# Patient Record
Sex: Female | Born: 1940
Health system: Southern US, Community
[De-identification: ages and names within clinical notes are randomized; demographics above are authoritative.]

## PROBLEM LIST (undated history)

## (undated) DIAGNOSIS — Z853 Personal history of malignant neoplasm of breast: Secondary | ICD-10-CM

## (undated) DIAGNOSIS — I10 Essential (primary) hypertension: Secondary | ICD-10-CM

## (undated) DIAGNOSIS — D649 Anemia, unspecified: Secondary | ICD-10-CM

## (undated) DIAGNOSIS — E119 Type 2 diabetes mellitus without complications: Secondary | ICD-10-CM

## (undated) DIAGNOSIS — K5730 Diverticulosis of large intestine without perforation or abscess without bleeding: Secondary | ICD-10-CM

## (undated) DIAGNOSIS — K219 Gastro-esophageal reflux disease without esophagitis: Secondary | ICD-10-CM

## (undated) DIAGNOSIS — G629 Polyneuropathy, unspecified: Secondary | ICD-10-CM

## (undated) DIAGNOSIS — R269 Unspecified abnormalities of gait and mobility: Secondary | ICD-10-CM

## (undated) DIAGNOSIS — E785 Hyperlipidemia, unspecified: Secondary | ICD-10-CM

## (undated) DIAGNOSIS — G40909 Epilepsy, unspecified, not intractable, without status epilepticus: Secondary | ICD-10-CM

## (undated) DIAGNOSIS — M4712 Other spondylosis with myelopathy, cervical region: Secondary | ICD-10-CM

## (undated) DIAGNOSIS — N289 Disorder of kidney and ureter, unspecified: Secondary | ICD-10-CM

## (undated) DIAGNOSIS — F419 Anxiety disorder, unspecified: Secondary | ICD-10-CM

## (undated) DIAGNOSIS — M353 Polymyalgia rheumatica: Secondary | ICD-10-CM

## (undated) DIAGNOSIS — M069 Rheumatoid arthritis, unspecified: Secondary | ICD-10-CM

## (undated) DIAGNOSIS — M1712 Unilateral primary osteoarthritis, left knee: Secondary | ICD-10-CM

## (undated) DIAGNOSIS — M316 Other giant cell arteritis: Secondary | ICD-10-CM

## (undated) DIAGNOSIS — G5600 Carpal tunnel syndrome, unspecified upper limb: Secondary | ICD-10-CM

## (undated) DIAGNOSIS — Z9889 Other specified postprocedural states: Secondary | ICD-10-CM

## (undated) DIAGNOSIS — G63 Polyneuropathy in diseases classified elsewhere: Secondary | ICD-10-CM

## (undated) DIAGNOSIS — I639 Cerebral infarction, unspecified: Secondary | ICD-10-CM

## (undated) HISTORY — DX: Diverticulosis of large intestine without perforation or abscess without bleeding: K57.30

## (undated) HISTORY — PX: CATARACT EXTRACTION: SUR2

## (undated) HISTORY — DX: Anemia, unspecified: D64.9

## (undated) HISTORY — DX: Rheumatoid arthritis, unspecified: M06.9

## (undated) HISTORY — DX: Type 2 diabetes mellitus without complications: E11.9

## (undated) HISTORY — DX: Epilepsy, unspecified, not intractable, without status epilepticus: G40.909

## (undated) HISTORY — PX: MASTECTOMY: SHX3

## (undated) HISTORY — DX: Anxiety disorder, unspecified: F41.9

## (undated) HISTORY — DX: Unilateral primary osteoarthritis, left knee: M17.12

## (undated) HISTORY — DX: Polyneuropathy, unspecified: G62.9

## (undated) HISTORY — DX: Hyperlipidemia, unspecified: E78.5

## (undated) HISTORY — DX: Cerebral infarction, unspecified: I63.9

## (undated) HISTORY — PX: NECK SURGERY: SHX720

## (undated) HISTORY — DX: Gastro-esophageal reflux disease without esophagitis: K21.9

## (undated) HISTORY — DX: Polymyalgia rheumatica: M35.3

## (undated) HISTORY — PX: CHOLECYSTECTOMY: SHX55

## (undated) HISTORY — DX: Polyneuropathy in diseases classified elsewhere: G63

## (undated) HISTORY — DX: Other spondylosis with myelopathy, cervical region: M47.12

## (undated) HISTORY — DX: Disorder of kidney and ureter, unspecified: N28.9

## (undated) HISTORY — DX: Essential (primary) hypertension: I10

## (undated) HISTORY — PX: TOTAL KNEE ARTHROPLASTY: SHX125

## (undated) HISTORY — PX: OTHER SURGICAL HISTORY: SHX169

## (undated) HISTORY — PX: TONSILLECTOMY: SUR1361

## (undated) HISTORY — DX: Unspecified abnormalities of gait and mobility: R26.9

## (undated) HISTORY — PX: CARPAL TUNNEL RELEASE: SHX101

## (undated) HISTORY — DX: Other giant cell arteritis: M31.6

## (undated) HISTORY — DX: Carpal tunnel syndrome, unspecified upper limb: G56.00

## (undated) HISTORY — DX: Other specified postprocedural states: Z98.890

## (undated) HISTORY — DX: Personal history of malignant neoplasm of breast: Z85.3

---

## 2000-09-16 ENCOUNTER — Ambulatory Visit (HOSPITAL_COMMUNITY): Admission: RE | Admit: 2000-09-16 | Discharge: 2000-09-16 | Payer: Self-pay | Admitting: Neurosurgery

## 2000-09-16 ENCOUNTER — Encounter: Payer: Self-pay | Admitting: Neurosurgery

## 2000-10-01 ENCOUNTER — Ambulatory Visit (HOSPITAL_COMMUNITY): Admission: RE | Admit: 2000-10-01 | Discharge: 2000-10-01 | Payer: Self-pay | Admitting: Neurosurgery

## 2000-10-01 ENCOUNTER — Encounter: Payer: Self-pay | Admitting: Neurosurgery

## 2000-10-15 ENCOUNTER — Encounter: Payer: Self-pay | Admitting: Neurosurgery

## 2000-10-15 ENCOUNTER — Ambulatory Visit (HOSPITAL_COMMUNITY): Admission: RE | Admit: 2000-10-15 | Discharge: 2000-10-15 | Payer: Self-pay | Admitting: Neurosurgery

## 2002-07-23 ENCOUNTER — Encounter: Payer: Self-pay | Admitting: Critical Care Medicine

## 2002-07-23 ENCOUNTER — Ambulatory Visit (HOSPITAL_COMMUNITY): Admission: RE | Admit: 2002-07-23 | Discharge: 2002-07-23 | Payer: Self-pay | Admitting: Critical Care Medicine

## 2003-07-26 ENCOUNTER — Ambulatory Visit (HOSPITAL_COMMUNITY): Admission: RE | Admit: 2003-07-26 | Discharge: 2003-07-26 | Payer: Self-pay | Admitting: Critical Care Medicine

## 2003-08-02 ENCOUNTER — Ambulatory Visit (HOSPITAL_COMMUNITY): Admission: RE | Admit: 2003-08-02 | Discharge: 2003-08-02 | Payer: Self-pay | Admitting: Critical Care Medicine

## 2003-08-02 ENCOUNTER — Encounter (INDEPENDENT_AMBULATORY_CARE_PROVIDER_SITE_OTHER): Payer: Self-pay | Admitting: Specialist

## 2004-09-22 ENCOUNTER — Ambulatory Visit: Payer: Self-pay | Admitting: Internal Medicine

## 2004-09-29 ENCOUNTER — Ambulatory Visit: Payer: Self-pay | Admitting: Internal Medicine

## 2004-10-03 ENCOUNTER — Encounter: Admission: RE | Admit: 2004-10-03 | Discharge: 2004-10-03 | Payer: Self-pay | Admitting: Internal Medicine

## 2004-10-16 ENCOUNTER — Ambulatory Visit: Payer: Self-pay | Admitting: Critical Care Medicine

## 2004-10-19 ENCOUNTER — Ambulatory Visit: Payer: Self-pay | Admitting: Gastroenterology

## 2004-10-19 ENCOUNTER — Ambulatory Visit: Payer: Self-pay | Admitting: Critical Care Medicine

## 2004-11-10 ENCOUNTER — Ambulatory Visit: Payer: Self-pay | Admitting: Gastroenterology

## 2004-11-14 ENCOUNTER — Ambulatory Visit: Payer: Self-pay | Admitting: Internal Medicine

## 2004-11-15 ENCOUNTER — Encounter (INDEPENDENT_AMBULATORY_CARE_PROVIDER_SITE_OTHER): Payer: Self-pay | Admitting: Specialist

## 2004-11-15 ENCOUNTER — Inpatient Hospital Stay (HOSPITAL_COMMUNITY): Admission: EM | Admit: 2004-11-15 | Discharge: 2004-11-17 | Payer: Self-pay | Admitting: Emergency Medicine

## 2004-12-02 ENCOUNTER — Emergency Department (HOSPITAL_COMMUNITY): Admission: EM | Admit: 2004-12-02 | Discharge: 2004-12-02 | Payer: Self-pay | Admitting: Emergency Medicine

## 2005-01-11 ENCOUNTER — Ambulatory Visit: Payer: Self-pay | Admitting: Internal Medicine

## 2005-01-11 ENCOUNTER — Ambulatory Visit: Payer: Self-pay | Admitting: Critical Care Medicine

## 2005-05-07 ENCOUNTER — Ambulatory Visit: Payer: Self-pay | Admitting: Critical Care Medicine

## 2005-08-08 ENCOUNTER — Ambulatory Visit: Payer: Self-pay | Admitting: Critical Care Medicine

## 2005-08-28 ENCOUNTER — Ambulatory Visit: Payer: Self-pay | Admitting: Pulmonary Disease

## 2005-09-11 ENCOUNTER — Ambulatory Visit: Payer: Self-pay | Admitting: Critical Care Medicine

## 2005-10-01 ENCOUNTER — Ambulatory Visit: Payer: Self-pay | Admitting: Critical Care Medicine

## 2005-10-04 ENCOUNTER — Ambulatory Visit: Payer: Self-pay | Admitting: Cardiology

## 2005-11-05 ENCOUNTER — Ambulatory Visit: Payer: Self-pay | Admitting: Internal Medicine

## 2005-12-05 ENCOUNTER — Ambulatory Visit: Payer: Self-pay | Admitting: Critical Care Medicine

## 2006-01-22 ENCOUNTER — Ambulatory Visit: Payer: Self-pay | Admitting: Internal Medicine

## 2006-03-11 ENCOUNTER — Ambulatory Visit: Payer: Self-pay | Admitting: Critical Care Medicine

## 2006-05-20 ENCOUNTER — Ambulatory Visit: Payer: Self-pay | Admitting: Internal Medicine

## 2006-06-28 ENCOUNTER — Ambulatory Visit: Payer: Self-pay | Admitting: Critical Care Medicine

## 2006-07-29 ENCOUNTER — Ambulatory Visit: Payer: Self-pay | Admitting: Critical Care Medicine

## 2006-07-30 ENCOUNTER — Ambulatory Visit: Payer: Self-pay | Admitting: *Deleted

## 2006-08-13 ENCOUNTER — Ambulatory Visit: Payer: Self-pay | Admitting: Critical Care Medicine

## 2006-09-20 ENCOUNTER — Ambulatory Visit: Payer: Self-pay | Admitting: Critical Care Medicine

## 2006-10-01 ENCOUNTER — Ambulatory Visit: Payer: Self-pay | Admitting: Internal Medicine

## 2006-10-01 LAB — CONVERTED CEMR LAB
Basophils Absolute: 0.1 10*3/uL (ref 0.0–0.1)
CO2: 31 meq/L (ref 19–32)
Creatinine, Ser: 0.7 mg/dL (ref 0.4–1.2)
Eosinophil percent: 6.5 % — ABNORMAL HIGH (ref 0.0–5.0)
HCT: 43.5 % (ref 36.0–46.0)
Hemoglobin: 14.4 g/dL (ref 12.0–15.0)
MCV: 93.9 fL (ref 78.0–100.0)
Neutrophils Relative %: 59 % (ref 43.0–77.0)
Potassium: 4.6 meq/L (ref 3.5–5.1)
RBC: 4.63 M/uL (ref 3.87–5.11)
Sodium: 141 meq/L (ref 135–145)
TSH: 1.94 microintl units/mL (ref 0.35–5.50)
Total Bilirubin: 0.8 mg/dL (ref 0.3–1.2)
Total Protein: 6.8 g/dL (ref 6.0–8.3)
WBC: 9.6 10*3/uL (ref 4.5–10.5)

## 2006-11-20 ENCOUNTER — Ambulatory Visit: Payer: Self-pay | Admitting: Critical Care Medicine

## 2006-11-21 ENCOUNTER — Ambulatory Visit: Payer: Self-pay | Admitting: Internal Medicine

## 2006-11-21 ENCOUNTER — Ambulatory Visit: Payer: Self-pay | Admitting: Family Medicine

## 2006-12-06 ENCOUNTER — Ambulatory Visit: Payer: Self-pay | Admitting: Internal Medicine

## 2006-12-11 ENCOUNTER — Ambulatory Visit: Payer: Self-pay | Admitting: Cardiovascular Disease

## 2006-12-20 ENCOUNTER — Ambulatory Visit: Payer: Self-pay

## 2007-01-06 ENCOUNTER — Ambulatory Visit: Payer: Self-pay | Admitting: Critical Care Medicine

## 2007-03-10 ENCOUNTER — Ambulatory Visit: Payer: Self-pay | Admitting: Internal Medicine

## 2007-03-14 ENCOUNTER — Ambulatory Visit: Payer: Self-pay | Admitting: Critical Care Medicine

## 2007-04-04 ENCOUNTER — Inpatient Hospital Stay (HOSPITAL_COMMUNITY): Admission: RE | Admit: 2007-04-04 | Discharge: 2007-04-09 | Payer: Self-pay | Admitting: Specialist

## 2007-04-04 ENCOUNTER — Ambulatory Visit: Payer: Self-pay | Admitting: Physical Medicine & Rehabilitation

## 2007-04-04 ENCOUNTER — Ambulatory Visit: Payer: Self-pay | Admitting: Internal Medicine

## 2007-04-21 ENCOUNTER — Ambulatory Visit: Payer: Self-pay | Admitting: Pulmonary Disease

## 2007-05-12 ENCOUNTER — Ambulatory Visit: Payer: Self-pay | Admitting: Critical Care Medicine

## 2007-05-17 ENCOUNTER — Encounter: Payer: Self-pay | Admitting: Critical Care Medicine

## 2007-05-17 DIAGNOSIS — M810 Age-related osteoporosis without current pathological fracture: Secondary | ICD-10-CM | POA: Insufficient documentation

## 2007-05-17 DIAGNOSIS — Z853 Personal history of malignant neoplasm of breast: Secondary | ICD-10-CM | POA: Insufficient documentation

## 2007-05-17 DIAGNOSIS — J8410 Pulmonary fibrosis, unspecified: Secondary | ICD-10-CM | POA: Insufficient documentation

## 2007-05-17 DIAGNOSIS — Z978 Presence of other specified devices: Secondary | ICD-10-CM | POA: Insufficient documentation

## 2007-05-17 DIAGNOSIS — Z9010 Acquired absence of unspecified breast and nipple: Secondary | ICD-10-CM | POA: Insufficient documentation

## 2007-06-26 DIAGNOSIS — J479 Bronchiectasis, uncomplicated: Secondary | ICD-10-CM | POA: Insufficient documentation

## 2007-07-14 ENCOUNTER — Ambulatory Visit: Payer: Self-pay | Admitting: Critical Care Medicine

## 2007-09-09 ENCOUNTER — Ambulatory Visit: Payer: Self-pay | Admitting: Critical Care Medicine

## 2007-09-09 DIAGNOSIS — J209 Acute bronchitis, unspecified: Secondary | ICD-10-CM | POA: Insufficient documentation

## 2007-09-29 ENCOUNTER — Telehealth: Payer: Self-pay | Admitting: Critical Care Medicine

## 2007-09-29 ENCOUNTER — Ambulatory Visit: Payer: Self-pay | Admitting: Critical Care Medicine

## 2007-10-08 ENCOUNTER — Ambulatory Visit: Payer: Self-pay | Admitting: Internal Medicine

## 2007-10-08 DIAGNOSIS — E785 Hyperlipidemia, unspecified: Secondary | ICD-10-CM | POA: Insufficient documentation

## 2007-10-08 DIAGNOSIS — R5383 Other fatigue: Secondary | ICD-10-CM

## 2007-10-08 DIAGNOSIS — R509 Fever, unspecified: Secondary | ICD-10-CM | POA: Insufficient documentation

## 2007-10-08 DIAGNOSIS — F411 Generalized anxiety disorder: Secondary | ICD-10-CM | POA: Insufficient documentation

## 2007-10-08 DIAGNOSIS — K5730 Diverticulosis of large intestine without perforation or abscess without bleeding: Secondary | ICD-10-CM | POA: Insufficient documentation

## 2007-10-08 DIAGNOSIS — J309 Allergic rhinitis, unspecified: Secondary | ICD-10-CM | POA: Insufficient documentation

## 2007-10-08 DIAGNOSIS — K219 Gastro-esophageal reflux disease without esophagitis: Secondary | ICD-10-CM | POA: Insufficient documentation

## 2007-10-08 DIAGNOSIS — R5381 Other malaise: Secondary | ICD-10-CM | POA: Insufficient documentation

## 2007-10-08 LAB — CONVERTED CEMR LAB
ALT: 20 units/L (ref 0–35)
Albumin: 2.4 g/dL — ABNORMAL LOW (ref 3.5–5.2)
Alkaline Phosphatase: 66 units/L (ref 39–117)
BUN: 14 mg/dL (ref 6–23)
Basophils Relative: 0.7 % (ref 0.0–1.0)
Bilirubin Urine: NEGATIVE
CO2: 30 meq/L (ref 19–32)
Calcium: 8.6 mg/dL (ref 8.4–10.5)
Creatinine, Ser: 0.7 mg/dL (ref 0.4–1.2)
GFR calc Af Amer: 107 mL/min
Monocytes Relative: 5.8 % (ref 3.0–11.0)
Neutro Abs: 14.6 10*3/uL — ABNORMAL HIGH (ref 1.4–7.7)
Platelets: 502 10*3/uL — ABNORMAL HIGH (ref 150–400)
RDW: 12.2 % (ref 11.5–14.6)
Specific Gravity, Urine: 1.025 (ref 1.000–1.03)
Total Protein, Urine: 30 mg/dL — AB
Total Protein: 6.7 g/dL (ref 6.0–8.3)
Urine Glucose: NEGATIVE mg/dL
pH: 6 (ref 5.0–8.0)

## 2007-10-09 ENCOUNTER — Ambulatory Visit: Payer: Self-pay | Admitting: Internal Medicine

## 2007-10-09 LAB — CONVERTED CEMR LAB
AST: 18 units/L (ref 0–37)
Bilirubin, Direct: 0.1 mg/dL (ref 0.0–0.3)
CO2: 30 meq/L (ref 19–32)
Crystals: NEGATIVE
Eosinophils Absolute: 0.6 10*3/uL (ref 0.0–0.6)
Eosinophils Relative: 3.2 % (ref 0.0–5.0)
GFR calc Af Amer: 107 mL/min
GFR calc non Af Amer: 89 mL/min
Glucose, Bld: 120 mg/dL — ABNORMAL HIGH (ref 70–99)
HCT: 37 % (ref 36.0–46.0)
Hemoglobin: 12.9 g/dL (ref 12.0–15.0)
Leukocytes, UA: NEGATIVE
Lymphocytes Relative: 10.1 % — ABNORMAL LOW (ref 12.0–46.0)
MCV: 89.9 fL (ref 78.0–100.0)
Monocytes Absolute: 1.1 10*3/uL — ABNORMAL HIGH (ref 0.2–0.7)
Mucus, UA: NEGATIVE
Neutro Abs: 14.6 10*3/uL — ABNORMAL HIGH (ref 1.4–7.7)
Neutrophils Relative %: 80.2 % — ABNORMAL HIGH (ref 43.0–77.0)
Nitrite: NEGATIVE
Platelets: 502 10*3/uL — ABNORMAL HIGH (ref 150–400)
Sodium: 136 meq/L (ref 135–145)
TSH: 1.8 microintl units/mL (ref 0.35–5.50)
Total Protein: 6.7 g/dL (ref 6.0–8.3)
Urine Glucose: NEGATIVE mg/dL
WBC: 18.2 10*3/uL (ref 4.5–10.5)

## 2007-10-18 ENCOUNTER — Ambulatory Visit: Payer: Self-pay | Admitting: Family Medicine

## 2007-10-18 DIAGNOSIS — D7289 Other specified disorders of white blood cells: Secondary | ICD-10-CM | POA: Insufficient documentation

## 2007-10-18 DIAGNOSIS — J069 Acute upper respiratory infection, unspecified: Secondary | ICD-10-CM | POA: Insufficient documentation

## 2007-10-20 ENCOUNTER — Ambulatory Visit: Payer: Self-pay | Admitting: Family Medicine

## 2007-10-21 ENCOUNTER — Encounter: Payer: Self-pay | Admitting: Internal Medicine

## 2007-10-21 DIAGNOSIS — D72829 Elevated white blood cell count, unspecified: Secondary | ICD-10-CM | POA: Insufficient documentation

## 2007-10-22 LAB — CONVERTED CEMR LAB
Basophils Absolute: 0 10*3/uL (ref 0.0–0.1)
Basophils Relative: 0 % (ref 0.0–1.0)
HCT: 35.7 % — ABNORMAL LOW (ref 36.0–46.0)
Hemoglobin: 12.1 g/dL (ref 12.0–15.0)
MCHC: 33.8 g/dL (ref 30.0–36.0)
Monocytes Absolute: 0.9 10*3/uL — ABNORMAL HIGH (ref 0.2–0.7)
Monocytes Relative: 5.3 % (ref 3.0–11.0)
RBC: 4.08 M/uL (ref 3.87–5.11)
RDW: 12.5 % (ref 11.5–14.6)

## 2007-10-23 ENCOUNTER — Ambulatory Visit: Payer: Self-pay | Admitting: Oncology

## 2007-10-23 ENCOUNTER — Ambulatory Visit: Payer: Self-pay | Admitting: Internal Medicine

## 2007-10-23 DIAGNOSIS — J019 Acute sinusitis, unspecified: Secondary | ICD-10-CM | POA: Insufficient documentation

## 2007-10-26 DIAGNOSIS — I639 Cerebral infarction, unspecified: Secondary | ICD-10-CM

## 2007-10-26 HISTORY — DX: Cerebral infarction, unspecified: I63.9

## 2007-10-31 ENCOUNTER — Encounter: Payer: Self-pay | Admitting: Internal Medicine

## 2007-10-31 ENCOUNTER — Telehealth (INDEPENDENT_AMBULATORY_CARE_PROVIDER_SITE_OTHER): Payer: Self-pay | Admitting: *Deleted

## 2007-10-31 DIAGNOSIS — H532 Diplopia: Secondary | ICD-10-CM | POA: Insufficient documentation

## 2007-10-31 LAB — CBC WITH DIFFERENTIAL/PLATELET
Basophils Absolute: 0 10*3/uL (ref 0.0–0.1)
EOS%: 2.1 % (ref 0.0–7.0)
LYMPH%: 5.2 % — ABNORMAL LOW (ref 14.0–48.0)
MCH: 29.2 pg (ref 26.0–34.0)
MCV: 87.2 fL (ref 81.0–101.0)
MONO%: 4.8 % (ref 0.0–13.0)
Platelets: 489 10*3/uL — ABNORMAL HIGH (ref 145–400)
RBC: 3.98 10*6/uL (ref 3.70–5.32)
RDW: 13.5 % (ref 11.3–14.5)

## 2007-10-31 LAB — COMPREHENSIVE METABOLIC PANEL
AST: 22 U/L (ref 0–37)
Alkaline Phosphatase: 91 U/L (ref 39–117)
BUN: 26 mg/dL — ABNORMAL HIGH (ref 6–23)
Calcium: 8.7 mg/dL (ref 8.4–10.5)
Creatinine, Ser: 1 mg/dL (ref 0.40–1.20)
Total Bilirubin: 0.5 mg/dL (ref 0.3–1.2)

## 2007-11-02 ENCOUNTER — Ambulatory Visit: Payer: Self-pay | Admitting: Cardiology

## 2007-11-02 ENCOUNTER — Inpatient Hospital Stay (HOSPITAL_COMMUNITY): Admission: EM | Admit: 2007-11-02 | Discharge: 2007-11-04 | Payer: Self-pay | Admitting: Emergency Medicine

## 2007-11-03 ENCOUNTER — Encounter (INDEPENDENT_AMBULATORY_CARE_PROVIDER_SITE_OTHER): Payer: Self-pay | Admitting: Pediatrics

## 2007-11-04 ENCOUNTER — Encounter: Payer: Self-pay | Admitting: Internal Medicine

## 2007-11-04 ENCOUNTER — Ambulatory Visit: Payer: Self-pay | Admitting: Internal Medicine

## 2007-11-14 LAB — CBC WITH DIFFERENTIAL/PLATELET
Eosinophils Absolute: 0.2 10*3/uL (ref 0.0–0.5)
LYMPH%: 5.6 % — ABNORMAL LOW (ref 14.0–48.0)
MONO#: 1.1 10*3/uL — ABNORMAL HIGH (ref 0.1–0.9)
NEUT#: 20.8 10*3/uL — ABNORMAL HIGH (ref 1.5–6.5)
Platelets: 690 10*3/uL — ABNORMAL HIGH (ref 145–400)
RBC: 4.02 10*6/uL (ref 3.70–5.32)
WBC: 23.4 10*3/uL — ABNORMAL HIGH (ref 3.9–10.0)

## 2007-11-15 ENCOUNTER — Inpatient Hospital Stay (HOSPITAL_COMMUNITY): Admission: EM | Admit: 2007-11-15 | Discharge: 2007-11-19 | Payer: Self-pay | Admitting: Emergency Medicine

## 2007-11-15 ENCOUNTER — Ambulatory Visit: Payer: Self-pay | Admitting: Internal Medicine

## 2007-11-17 ENCOUNTER — Encounter (INDEPENDENT_AMBULATORY_CARE_PROVIDER_SITE_OTHER): Payer: Self-pay | Admitting: General Surgery

## 2007-11-17 ENCOUNTER — Encounter: Payer: Self-pay | Admitting: Internal Medicine

## 2007-11-20 ENCOUNTER — Telehealth: Payer: Self-pay | Admitting: Internal Medicine

## 2007-11-20 ENCOUNTER — Inpatient Hospital Stay (HOSPITAL_COMMUNITY): Admission: EM | Admit: 2007-11-20 | Discharge: 2007-11-26 | Payer: Self-pay | Admitting: Emergency Medicine

## 2007-12-02 ENCOUNTER — Encounter: Payer: Self-pay | Admitting: Internal Medicine

## 2007-12-19 ENCOUNTER — Encounter: Payer: Self-pay | Admitting: Internal Medicine

## 2007-12-30 ENCOUNTER — Ambulatory Visit: Payer: Self-pay | Admitting: Internal Medicine

## 2007-12-30 DIAGNOSIS — D649 Anemia, unspecified: Secondary | ICD-10-CM | POA: Insufficient documentation

## 2007-12-30 DIAGNOSIS — Z8679 Personal history of other diseases of the circulatory system: Secondary | ICD-10-CM | POA: Insufficient documentation

## 2007-12-30 DIAGNOSIS — E119 Type 2 diabetes mellitus without complications: Secondary | ICD-10-CM | POA: Insufficient documentation

## 2007-12-30 DIAGNOSIS — G609 Hereditary and idiopathic neuropathy, unspecified: Secondary | ICD-10-CM | POA: Insufficient documentation

## 2007-12-30 DIAGNOSIS — M79609 Pain in unspecified limb: Secondary | ICD-10-CM | POA: Insufficient documentation

## 2007-12-30 DIAGNOSIS — I10 Essential (primary) hypertension: Secondary | ICD-10-CM | POA: Insufficient documentation

## 2007-12-30 DIAGNOSIS — R569 Unspecified convulsions: Secondary | ICD-10-CM | POA: Insufficient documentation

## 2008-01-09 ENCOUNTER — Emergency Department (HOSPITAL_COMMUNITY): Admission: EM | Admit: 2008-01-09 | Discharge: 2008-01-09 | Payer: Self-pay | Admitting: Emergency Medicine

## 2008-01-13 ENCOUNTER — Telehealth: Payer: Self-pay | Admitting: Endocrinology

## 2008-01-13 ENCOUNTER — Ambulatory Visit: Payer: Self-pay | Admitting: Critical Care Medicine

## 2008-01-13 ENCOUNTER — Encounter: Payer: Self-pay | Admitting: Internal Medicine

## 2008-01-26 ENCOUNTER — Telehealth (INDEPENDENT_AMBULATORY_CARE_PROVIDER_SITE_OTHER): Payer: Self-pay | Admitting: *Deleted

## 2008-01-26 DIAGNOSIS — G5600 Carpal tunnel syndrome, unspecified upper limb: Secondary | ICD-10-CM | POA: Insufficient documentation

## 2008-01-29 ENCOUNTER — Encounter: Payer: Self-pay | Admitting: Internal Medicine

## 2008-02-08 ENCOUNTER — Encounter: Payer: Self-pay | Admitting: Internal Medicine

## 2008-02-10 ENCOUNTER — Ambulatory Visit (HOSPITAL_BASED_OUTPATIENT_CLINIC_OR_DEPARTMENT_OTHER): Admission: RE | Admit: 2008-02-10 | Discharge: 2008-02-10 | Payer: Self-pay | Admitting: Orthopedic Surgery

## 2008-02-10 ENCOUNTER — Encounter: Payer: Self-pay | Admitting: Internal Medicine

## 2008-02-12 ENCOUNTER — Ambulatory Visit: Payer: Self-pay | Admitting: Internal Medicine

## 2008-02-12 DIAGNOSIS — M549 Dorsalgia, unspecified: Secondary | ICD-10-CM | POA: Insufficient documentation

## 2008-02-13 ENCOUNTER — Encounter: Payer: Self-pay | Admitting: Internal Medicine

## 2008-02-16 ENCOUNTER — Emergency Department (HOSPITAL_COMMUNITY): Admission: EM | Admit: 2008-02-16 | Discharge: 2008-02-17 | Payer: Self-pay | Admitting: Emergency Medicine

## 2008-02-17 ENCOUNTER — Encounter: Payer: Self-pay | Admitting: Internal Medicine

## 2008-02-20 ENCOUNTER — Emergency Department (HOSPITAL_COMMUNITY): Admission: EM | Admit: 2008-02-20 | Discharge: 2008-02-20 | Payer: Self-pay | Admitting: Emergency Medicine

## 2008-02-21 ENCOUNTER — Emergency Department (HOSPITAL_COMMUNITY): Admission: EM | Admit: 2008-02-21 | Discharge: 2008-02-21 | Payer: Self-pay | Admitting: Emergency Medicine

## 2008-02-21 ENCOUNTER — Emergency Department (HOSPITAL_COMMUNITY): Admission: EM | Admit: 2008-02-21 | Discharge: 2008-02-22 | Payer: Self-pay | Admitting: Emergency Medicine

## 2008-02-23 ENCOUNTER — Ambulatory Visit: Payer: Self-pay | Admitting: Internal Medicine

## 2008-02-26 ENCOUNTER — Ambulatory Visit: Payer: Self-pay | Admitting: Critical Care Medicine

## 2008-03-01 ENCOUNTER — Encounter: Payer: Self-pay | Admitting: Internal Medicine

## 2008-03-02 ENCOUNTER — Ambulatory Visit: Payer: Self-pay | Admitting: Internal Medicine

## 2008-03-22 ENCOUNTER — Encounter: Payer: Self-pay | Admitting: Internal Medicine

## 2008-03-30 ENCOUNTER — Ambulatory Visit: Payer: Self-pay | Admitting: Internal Medicine

## 2008-03-30 LAB — CONVERTED CEMR LAB
BUN: 36 mg/dL — ABNORMAL HIGH (ref 6–23)
Calcium: 8.7 mg/dL (ref 8.4–10.5)
Cholesterol: 184 mg/dL (ref 0–200)
Creatinine, Ser: 1.5 mg/dL — ABNORMAL HIGH (ref 0.4–1.2)
GFR calc Af Amer: 45 mL/min
GFR calc non Af Amer: 37 mL/min
Glucose, Bld: 204 mg/dL — ABNORMAL HIGH (ref 70–99)
HDL: 73.4 mg/dL (ref >39.0)
LDL Cholesterol: 87 mg/dL (ref 0–99)
Potassium: 4.2 meq/L (ref 3.5–5.1)
VLDL: 23 mg/dL (ref 0–40)

## 2008-04-07 ENCOUNTER — Ambulatory Visit: Payer: Self-pay | Admitting: Internal Medicine

## 2008-04-07 DIAGNOSIS — M316 Other giant cell arteritis: Secondary | ICD-10-CM | POA: Insufficient documentation

## 2008-04-28 ENCOUNTER — Telehealth: Payer: Self-pay | Admitting: Internal Medicine

## 2008-04-29 ENCOUNTER — Ambulatory Visit: Payer: Self-pay | Admitting: Internal Medicine

## 2008-05-02 ENCOUNTER — Encounter: Payer: Self-pay | Admitting: Internal Medicine

## 2008-05-06 ENCOUNTER — Ambulatory Visit: Payer: Self-pay | Admitting: Internal Medicine

## 2008-05-06 DIAGNOSIS — R519 Headache, unspecified: Secondary | ICD-10-CM | POA: Insufficient documentation

## 2008-05-06 DIAGNOSIS — N3942 Incontinence without sensory awareness: Secondary | ICD-10-CM | POA: Insufficient documentation

## 2008-05-06 DIAGNOSIS — R51 Headache: Secondary | ICD-10-CM | POA: Insufficient documentation

## 2008-05-10 ENCOUNTER — Encounter: Payer: Self-pay | Admitting: Internal Medicine

## 2008-05-12 ENCOUNTER — Encounter: Admission: RE | Admit: 2008-05-12 | Discharge: 2008-05-12 | Payer: Self-pay | Admitting: Internal Medicine

## 2008-05-17 ENCOUNTER — Telehealth: Payer: Self-pay | Admitting: Internal Medicine

## 2008-05-18 ENCOUNTER — Ambulatory Visit: Payer: Self-pay | Admitting: Critical Care Medicine

## 2008-05-20 ENCOUNTER — Ambulatory Visit: Payer: Self-pay | Admitting: Internal Medicine

## 2008-05-20 ENCOUNTER — Telehealth: Payer: Self-pay | Admitting: Internal Medicine

## 2008-05-21 ENCOUNTER — Telehealth: Payer: Self-pay | Admitting: Internal Medicine

## 2008-06-02 ENCOUNTER — Telehealth: Payer: Self-pay | Admitting: Internal Medicine

## 2008-06-08 ENCOUNTER — Telehealth: Payer: Self-pay | Admitting: Internal Medicine

## 2008-06-09 ENCOUNTER — Telehealth: Payer: Self-pay | Admitting: Internal Medicine

## 2008-06-14 ENCOUNTER — Encounter: Payer: Self-pay | Admitting: Internal Medicine

## 2008-06-15 ENCOUNTER — Encounter: Payer: Self-pay | Admitting: Internal Medicine

## 2008-06-26 ENCOUNTER — Emergency Department (HOSPITAL_COMMUNITY): Admission: EM | Admit: 2008-06-26 | Discharge: 2008-06-26 | Payer: Self-pay | Admitting: Emergency Medicine

## 2008-06-28 ENCOUNTER — Telehealth: Payer: Self-pay | Admitting: Internal Medicine

## 2008-07-01 ENCOUNTER — Ambulatory Visit: Payer: Self-pay | Admitting: Internal Medicine

## 2008-07-01 LAB — CONVERTED CEMR LAB
BUN: 45 mg/dL — ABNORMAL HIGH (ref 6–23)
CO2: 25 meq/L (ref 19–32)
Calcium: 9.3 mg/dL (ref 8.4–10.5)
Creatinine, Ser: 2.2 mg/dL — ABNORMAL HIGH (ref 0.4–1.2)
GFR calc Af Amer: 29 mL/min
Glucose, Bld: 97 mg/dL (ref 70–99)

## 2008-07-05 ENCOUNTER — Encounter: Payer: Self-pay | Admitting: Internal Medicine

## 2008-07-05 ENCOUNTER — Telehealth: Payer: Self-pay | Admitting: Internal Medicine

## 2008-07-07 ENCOUNTER — Ambulatory Visit: Payer: Self-pay | Admitting: Internal Medicine

## 2008-07-08 ENCOUNTER — Ambulatory Visit: Payer: Self-pay | Admitting: Internal Medicine

## 2008-07-08 DIAGNOSIS — S81009A Unspecified open wound, unspecified knee, initial encounter: Secondary | ICD-10-CM | POA: Insufficient documentation

## 2008-07-08 DIAGNOSIS — S91009A Unspecified open wound, unspecified ankle, initial encounter: Secondary | ICD-10-CM

## 2008-07-08 DIAGNOSIS — S81809A Unspecified open wound, unspecified lower leg, initial encounter: Secondary | ICD-10-CM | POA: Insufficient documentation

## 2008-07-21 ENCOUNTER — Ambulatory Visit: Payer: Self-pay | Admitting: Critical Care Medicine

## 2008-07-23 ENCOUNTER — Encounter (HOSPITAL_BASED_OUTPATIENT_CLINIC_OR_DEPARTMENT_OTHER): Admission: RE | Admit: 2008-07-23 | Discharge: 2008-09-23 | Payer: Self-pay | Admitting: Internal Medicine

## 2008-07-27 ENCOUNTER — Encounter: Payer: Self-pay | Admitting: Internal Medicine

## 2008-07-27 ENCOUNTER — Telehealth (INDEPENDENT_AMBULATORY_CARE_PROVIDER_SITE_OTHER): Payer: Self-pay | Admitting: *Deleted

## 2008-08-02 ENCOUNTER — Encounter: Payer: Self-pay | Admitting: Internal Medicine

## 2008-08-09 ENCOUNTER — Encounter: Payer: Self-pay | Admitting: Internal Medicine

## 2008-09-06 ENCOUNTER — Encounter: Payer: Self-pay | Admitting: Internal Medicine

## 2008-09-13 ENCOUNTER — Encounter: Payer: Self-pay | Admitting: Internal Medicine

## 2008-09-23 ENCOUNTER — Encounter: Payer: Self-pay | Admitting: Internal Medicine

## 2008-09-24 HISTORY — PX: CERVICAL FUSION: SHX112

## 2008-09-27 ENCOUNTER — Encounter (HOSPITAL_BASED_OUTPATIENT_CLINIC_OR_DEPARTMENT_OTHER): Admission: RE | Admit: 2008-09-27 | Discharge: 2008-11-01 | Payer: Self-pay | Admitting: Internal Medicine

## 2008-10-04 ENCOUNTER — Telehealth: Payer: Self-pay | Admitting: Internal Medicine

## 2008-10-06 ENCOUNTER — Ambulatory Visit: Payer: Self-pay | Admitting: Internal Medicine

## 2008-10-06 LAB — CONVERTED CEMR LAB: Sed Rate: 19 mm/hr (ref 0–22)

## 2008-10-08 ENCOUNTER — Telehealth: Payer: Self-pay | Admitting: Internal Medicine

## 2008-10-11 ENCOUNTER — Telehealth: Payer: Self-pay | Admitting: Internal Medicine

## 2008-10-19 ENCOUNTER — Ambulatory Visit: Payer: Self-pay | Admitting: Critical Care Medicine

## 2008-10-19 ENCOUNTER — Encounter: Payer: Self-pay | Admitting: Internal Medicine

## 2008-10-27 ENCOUNTER — Ambulatory Visit: Payer: Self-pay | Admitting: Internal Medicine

## 2008-10-27 ENCOUNTER — Telehealth: Payer: Self-pay | Admitting: Internal Medicine

## 2008-10-28 ENCOUNTER — Encounter: Payer: Self-pay | Admitting: Internal Medicine

## 2008-11-01 ENCOUNTER — Ambulatory Visit: Payer: Self-pay | Admitting: Internal Medicine

## 2008-11-01 LAB — CONVERTED CEMR LAB
ALT: 18 units/L (ref 0–35)
Basophils Absolute: 0.1 10*3/uL (ref 0.0–0.1)
Basophils Relative: 0.8 % (ref 0.0–3.0)
CO2: 27 meq/L (ref 19–32)
Calcium: 8.8 mg/dL (ref 8.4–10.5)
Creatinine, Ser: 1.6 mg/dL — ABNORMAL HIGH (ref 0.4–1.2)
GFR calc Af Amer: 41 mL/min
Glucose, Bld: 90 mg/dL (ref 70–99)
HCT: 39.4 % (ref 36.0–46.0)
Hemoglobin: 13.3 g/dL (ref 12.0–15.0)
Lymphocytes Relative: 21.7 % (ref 12.0–46.0)
MCHC: 33.6 g/dL (ref 30.0–36.0)
Monocytes Absolute: 0.9 10*3/uL (ref 0.1–1.0)
Neutro Abs: 6.5 10*3/uL (ref 1.4–7.7)
RBC: 4.01 M/uL (ref 3.87–5.11)
RDW: 13.4 % (ref 11.5–14.6)
Total Bilirubin: 1 mg/dL (ref 0.3–1.2)
Total Protein: 6.4 g/dL (ref 6.0–8.3)

## 2008-11-04 ENCOUNTER — Telehealth: Payer: Self-pay | Admitting: Internal Medicine

## 2008-11-18 ENCOUNTER — Encounter: Admission: RE | Admit: 2008-11-18 | Discharge: 2008-11-18 | Payer: Self-pay | Admitting: Neurology

## 2008-11-26 ENCOUNTER — Encounter: Admission: RE | Admit: 2008-11-26 | Discharge: 2008-11-26 | Payer: Self-pay | Admitting: Neurology

## 2008-12-02 ENCOUNTER — Encounter: Payer: Self-pay | Admitting: Internal Medicine

## 2008-12-04 ENCOUNTER — Emergency Department (HOSPITAL_COMMUNITY): Admission: EM | Admit: 2008-12-04 | Discharge: 2008-12-04 | Payer: Self-pay | Admitting: Emergency Medicine

## 2008-12-09 ENCOUNTER — Encounter: Admission: RE | Admit: 2008-12-09 | Discharge: 2008-12-09 | Payer: Self-pay | Admitting: Neurosurgery

## 2008-12-13 ENCOUNTER — Telehealth: Payer: Self-pay | Admitting: Internal Medicine

## 2008-12-14 ENCOUNTER — Telehealth: Payer: Self-pay | Admitting: Internal Medicine

## 2008-12-14 ENCOUNTER — Ambulatory Visit: Payer: Self-pay | Admitting: Internal Medicine

## 2008-12-14 LAB — CONVERTED CEMR LAB: Sed Rate: 15 mm/hr (ref 0–22)

## 2008-12-19 ENCOUNTER — Telehealth: Payer: Self-pay | Admitting: Internal Medicine

## 2008-12-21 ENCOUNTER — Ambulatory Visit: Payer: Self-pay | Admitting: Critical Care Medicine

## 2008-12-27 ENCOUNTER — Telehealth: Payer: Self-pay | Admitting: Internal Medicine

## 2008-12-31 ENCOUNTER — Encounter: Payer: Self-pay | Admitting: Internal Medicine

## 2008-12-31 ENCOUNTER — Ambulatory Visit: Payer: Self-pay | Admitting: Internal Medicine

## 2009-01-12 ENCOUNTER — Observation Stay (HOSPITAL_COMMUNITY): Admission: EM | Admit: 2009-01-12 | Discharge: 2009-01-13 | Payer: Self-pay | Admitting: Emergency Medicine

## 2009-01-12 ENCOUNTER — Encounter: Payer: Self-pay | Admitting: Internal Medicine

## 2009-01-12 ENCOUNTER — Ambulatory Visit: Payer: Self-pay | Admitting: Internal Medicine

## 2009-01-12 DIAGNOSIS — R110 Nausea: Secondary | ICD-10-CM | POA: Insufficient documentation

## 2009-01-17 ENCOUNTER — Encounter: Payer: Self-pay | Admitting: Internal Medicine

## 2009-02-02 ENCOUNTER — Telehealth: Payer: Self-pay | Admitting: Internal Medicine

## 2009-02-04 ENCOUNTER — Telehealth: Payer: Self-pay | Admitting: Internal Medicine

## 2009-02-11 ENCOUNTER — Ambulatory Visit: Payer: Self-pay | Admitting: Internal Medicine

## 2009-02-11 LAB — CONVERTED CEMR LAB: Sed Rate: 19 mm/hr (ref 0–22)

## 2009-02-18 ENCOUNTER — Ambulatory Visit: Payer: Self-pay | Admitting: Internal Medicine

## 2009-02-25 ENCOUNTER — Encounter: Payer: Self-pay | Admitting: Internal Medicine

## 2009-03-14 ENCOUNTER — Telehealth (INDEPENDENT_AMBULATORY_CARE_PROVIDER_SITE_OTHER): Payer: Self-pay | Admitting: *Deleted

## 2009-03-15 ENCOUNTER — Ambulatory Visit: Payer: Self-pay | Admitting: Internal Medicine

## 2009-03-15 LAB — CONVERTED CEMR LAB: Sed Rate: 17 mm/hr (ref 0–22)

## 2009-03-16 ENCOUNTER — Ambulatory Visit: Payer: Self-pay | Admitting: Critical Care Medicine

## 2009-03-18 ENCOUNTER — Telehealth: Payer: Self-pay | Admitting: Internal Medicine

## 2009-03-30 ENCOUNTER — Telehealth: Payer: Self-pay | Admitting: Internal Medicine

## 2009-04-05 ENCOUNTER — Encounter: Payer: Self-pay | Admitting: Internal Medicine

## 2009-04-06 ENCOUNTER — Telehealth: Payer: Self-pay | Admitting: Internal Medicine

## 2009-04-18 ENCOUNTER — Ambulatory Visit: Payer: Self-pay | Admitting: Internal Medicine

## 2009-04-18 DIAGNOSIS — IMO0002 Reserved for concepts with insufficient information to code with codable children: Secondary | ICD-10-CM | POA: Insufficient documentation

## 2009-04-18 DIAGNOSIS — I872 Venous insufficiency (chronic) (peripheral): Secondary | ICD-10-CM | POA: Insufficient documentation

## 2009-04-27 ENCOUNTER — Encounter: Payer: Self-pay | Admitting: Internal Medicine

## 2009-05-18 ENCOUNTER — Telehealth: Payer: Self-pay | Admitting: Internal Medicine

## 2009-05-20 ENCOUNTER — Ambulatory Visit: Payer: Self-pay | Admitting: Internal Medicine

## 2009-05-24 ENCOUNTER — Encounter: Payer: Self-pay | Admitting: Internal Medicine

## 2009-05-25 ENCOUNTER — Telehealth: Payer: Self-pay | Admitting: Internal Medicine

## 2009-06-10 ENCOUNTER — Ambulatory Visit: Payer: Self-pay | Admitting: Critical Care Medicine

## 2009-07-05 ENCOUNTER — Telehealth: Payer: Self-pay | Admitting: Internal Medicine

## 2009-07-06 ENCOUNTER — Encounter: Admission: RE | Admit: 2009-07-06 | Discharge: 2009-09-22 | Payer: Self-pay | Admitting: Neurology

## 2009-07-07 ENCOUNTER — Ambulatory Visit: Payer: Self-pay | Admitting: Internal Medicine

## 2009-07-11 ENCOUNTER — Telehealth: Payer: Self-pay | Admitting: Internal Medicine

## 2009-07-14 ENCOUNTER — Ambulatory Visit: Payer: Self-pay | Admitting: Internal Medicine

## 2009-08-24 LAB — HM MAMMOGRAPHY: HM Mammogram: NORMAL

## 2009-08-26 ENCOUNTER — Telehealth: Payer: Self-pay | Admitting: Internal Medicine

## 2009-08-29 ENCOUNTER — Encounter: Payer: Self-pay | Admitting: Internal Medicine

## 2009-08-31 ENCOUNTER — Ambulatory Visit: Payer: Self-pay | Admitting: Internal Medicine

## 2009-09-01 ENCOUNTER — Telehealth: Payer: Self-pay | Admitting: Internal Medicine

## 2009-09-02 ENCOUNTER — Encounter: Payer: Self-pay | Admitting: Internal Medicine

## 2009-09-26 ENCOUNTER — Encounter: Payer: Self-pay | Admitting: Internal Medicine

## 2009-09-26 ENCOUNTER — Encounter: Admission: RE | Admit: 2009-09-26 | Discharge: 2009-10-06 | Payer: Self-pay | Admitting: Neurology

## 2009-10-10 ENCOUNTER — Telehealth: Payer: Self-pay | Admitting: Internal Medicine

## 2009-10-17 ENCOUNTER — Telehealth: Payer: Self-pay | Admitting: Internal Medicine

## 2009-10-20 ENCOUNTER — Ambulatory Visit: Payer: Self-pay | Admitting: Internal Medicine

## 2009-10-20 DIAGNOSIS — M722 Plantar fascial fibromatosis: Secondary | ICD-10-CM | POA: Insufficient documentation

## 2009-10-21 ENCOUNTER — Encounter (INDEPENDENT_AMBULATORY_CARE_PROVIDER_SITE_OTHER): Payer: Self-pay | Admitting: *Deleted

## 2009-10-21 ENCOUNTER — Telehealth: Payer: Self-pay | Admitting: Internal Medicine

## 2009-10-26 ENCOUNTER — Encounter: Payer: Self-pay | Admitting: Internal Medicine

## 2009-11-07 ENCOUNTER — Telehealth: Payer: Self-pay | Admitting: Internal Medicine

## 2009-11-10 ENCOUNTER — Ambulatory Visit: Payer: Self-pay | Admitting: Internal Medicine

## 2009-11-23 ENCOUNTER — Encounter: Payer: Self-pay | Admitting: Internal Medicine

## 2009-11-29 ENCOUNTER — Encounter: Payer: Self-pay | Admitting: Internal Medicine

## 2009-12-12 ENCOUNTER — Telehealth: Payer: Self-pay | Admitting: Internal Medicine

## 2009-12-13 ENCOUNTER — Encounter: Payer: Self-pay | Admitting: Internal Medicine

## 2009-12-14 ENCOUNTER — Telehealth: Payer: Self-pay | Admitting: Internal Medicine

## 2009-12-19 ENCOUNTER — Telehealth: Payer: Self-pay | Admitting: Internal Medicine

## 2009-12-20 ENCOUNTER — Encounter: Payer: Self-pay | Admitting: Internal Medicine

## 2010-01-02 ENCOUNTER — Encounter: Payer: Self-pay | Admitting: Internal Medicine

## 2010-01-03 ENCOUNTER — Encounter: Payer: Self-pay | Admitting: Internal Medicine

## 2010-01-03 ENCOUNTER — Telehealth: Payer: Self-pay | Admitting: Internal Medicine

## 2010-01-24 ENCOUNTER — Encounter: Payer: Self-pay | Admitting: Internal Medicine

## 2010-02-09 ENCOUNTER — Telehealth: Payer: Self-pay | Admitting: Internal Medicine

## 2010-02-15 ENCOUNTER — Telehealth: Payer: Self-pay | Admitting: Internal Medicine

## 2010-02-21 ENCOUNTER — Telehealth: Payer: Self-pay | Admitting: Internal Medicine

## 2010-02-21 ENCOUNTER — Ambulatory Visit: Payer: Self-pay | Admitting: Internal Medicine

## 2010-02-21 LAB — CONVERTED CEMR LAB: Sed Rate: 30 mm/hr — ABNORMAL HIGH (ref 0–22)

## 2010-02-22 ENCOUNTER — Encounter: Payer: Self-pay | Admitting: Internal Medicine

## 2010-02-23 ENCOUNTER — Telehealth: Payer: Self-pay | Admitting: Internal Medicine

## 2010-04-03 ENCOUNTER — Telehealth: Payer: Self-pay | Admitting: Internal Medicine

## 2010-04-04 ENCOUNTER — Ambulatory Visit: Payer: Self-pay | Admitting: Internal Medicine

## 2010-04-04 LAB — CONVERTED CEMR LAB: Sed Rate: 29 mm/hr — ABNORMAL HIGH (ref 0–22)

## 2010-04-05 ENCOUNTER — Telehealth: Payer: Self-pay | Admitting: Internal Medicine

## 2010-04-07 ENCOUNTER — Ambulatory Visit: Payer: Self-pay | Admitting: Internal Medicine

## 2010-04-07 LAB — CONVERTED CEMR LAB
Bilirubin, Direct: 0.1 mg/dL (ref 0.0–0.3)
Total Bilirubin: 0.5 mg/dL (ref 0.3–1.2)

## 2010-04-11 ENCOUNTER — Telehealth: Payer: Self-pay | Admitting: Internal Medicine

## 2010-04-24 HISTORY — PX: LUMBAR FUSION: SHX111

## 2010-04-26 ENCOUNTER — Telehealth: Payer: Self-pay | Admitting: Gastroenterology

## 2010-05-16 ENCOUNTER — Telehealth: Payer: Self-pay | Admitting: Internal Medicine

## 2010-05-22 ENCOUNTER — Telehealth: Payer: Self-pay | Admitting: Internal Medicine

## 2010-05-23 ENCOUNTER — Telehealth: Payer: Self-pay | Admitting: Internal Medicine

## 2010-06-13 ENCOUNTER — Encounter: Payer: Self-pay | Admitting: Internal Medicine

## 2010-06-20 ENCOUNTER — Emergency Department (HOSPITAL_COMMUNITY): Admission: EM | Admit: 2010-06-20 | Discharge: 2010-06-20 | Payer: Self-pay | Admitting: Emergency Medicine

## 2010-06-21 ENCOUNTER — Encounter: Payer: Self-pay | Admitting: Internal Medicine

## 2010-06-21 ENCOUNTER — Emergency Department (HOSPITAL_COMMUNITY): Admission: EM | Admit: 2010-06-21 | Discharge: 2010-06-21 | Payer: Self-pay | Admitting: Emergency Medicine

## 2010-06-26 ENCOUNTER — Ambulatory Visit: Payer: Self-pay | Admitting: Internal Medicine

## 2010-06-28 ENCOUNTER — Encounter: Payer: Self-pay | Admitting: Internal Medicine

## 2010-07-03 ENCOUNTER — Telehealth: Payer: Self-pay | Admitting: Internal Medicine

## 2010-07-05 ENCOUNTER — Telehealth: Payer: Self-pay | Admitting: Internal Medicine

## 2010-07-13 ENCOUNTER — Encounter: Payer: Self-pay | Admitting: Internal Medicine

## 2010-07-17 ENCOUNTER — Telehealth: Payer: Self-pay | Admitting: Internal Medicine

## 2010-07-20 ENCOUNTER — Telehealth: Payer: Self-pay | Admitting: Internal Medicine

## 2010-07-24 ENCOUNTER — Telehealth: Payer: Self-pay | Admitting: Internal Medicine

## 2010-07-25 ENCOUNTER — Encounter: Payer: Self-pay | Admitting: Internal Medicine

## 2010-07-25 HISTORY — PX: SPINAL FUSION: SHX223

## 2010-07-28 ENCOUNTER — Encounter: Payer: Self-pay | Admitting: Internal Medicine

## 2010-07-31 ENCOUNTER — Telehealth: Payer: Self-pay | Admitting: Internal Medicine

## 2010-08-04 ENCOUNTER — Ambulatory Visit: Payer: Self-pay | Admitting: Internal Medicine

## 2010-08-04 ENCOUNTER — Encounter: Payer: Self-pay | Admitting: Internal Medicine

## 2010-08-08 ENCOUNTER — Telehealth: Payer: Self-pay | Admitting: Internal Medicine

## 2010-08-09 ENCOUNTER — Encounter: Payer: Self-pay | Admitting: Internal Medicine

## 2010-08-11 ENCOUNTER — Telehealth: Payer: Self-pay | Admitting: Internal Medicine

## 2010-08-17 ENCOUNTER — Emergency Department (HOSPITAL_COMMUNITY)
Admission: EM | Admit: 2010-08-17 | Discharge: 2010-08-17 | Payer: Self-pay | Source: Home / Self Care | Admitting: Emergency Medicine

## 2010-08-25 ENCOUNTER — Encounter: Payer: Self-pay | Admitting: Internal Medicine

## 2010-08-28 ENCOUNTER — Encounter: Payer: Self-pay | Admitting: Internal Medicine

## 2010-09-28 ENCOUNTER — Other Ambulatory Visit: Payer: Self-pay | Admitting: Internal Medicine

## 2010-09-28 ENCOUNTER — Ambulatory Visit
Admission: RE | Admit: 2010-09-28 | Discharge: 2010-09-28 | Payer: Self-pay | Source: Home / Self Care | Attending: Internal Medicine | Admitting: Internal Medicine

## 2010-09-28 LAB — SEDIMENTATION RATE: Sed Rate: 43 mm/hr — ABNORMAL HIGH (ref 0–22)

## 2010-09-29 ENCOUNTER — Encounter: Payer: Self-pay | Admitting: Internal Medicine

## 2010-10-03 ENCOUNTER — Ambulatory Visit
Admission: RE | Admit: 2010-10-03 | Discharge: 2010-10-03 | Payer: Self-pay | Source: Home / Self Care | Attending: Internal Medicine | Admitting: Internal Medicine

## 2010-10-04 ENCOUNTER — Encounter: Payer: Self-pay | Admitting: Internal Medicine

## 2010-10-07 ENCOUNTER — Telehealth: Payer: Self-pay | Admitting: Internal Medicine

## 2010-10-09 ENCOUNTER — Telehealth: Payer: Self-pay | Admitting: Internal Medicine

## 2010-10-10 ENCOUNTER — Encounter: Payer: Self-pay | Admitting: Internal Medicine

## 2010-10-12 ENCOUNTER — Telehealth: Payer: Self-pay | Admitting: Internal Medicine

## 2010-10-15 ENCOUNTER — Encounter: Payer: Self-pay | Admitting: Neurosurgery

## 2010-10-16 ENCOUNTER — Encounter: Payer: Self-pay | Admitting: Internal Medicine

## 2010-10-22 LAB — CONVERTED CEMR LAB
Ketones, urine, test strip: NEGATIVE
WBC Urine, dipstick: NEGATIVE

## 2010-10-24 NOTE — Progress Notes (Signed)
Summary: results  Phone Note Call from Patient Call back at Home Phone [phone]   Summary of Call: Patient calling for blood work results from last week. Please advise.  Initial call taken by: Ernestene Mention CMA,  April 11, 2010 4:22 PM  Follow-up for Phone Call        LFT's normal Follow-up by: Neena Rhymes MD,  April 11, 2010 5:47 PM  Additional Follow-up for Phone Call Additional follow up Details #1::        Left vm on hm # Additional Follow-up by: Charlsie Quest, CMA,  April 11, 2010 5:58 PM

## 2010-10-24 NOTE — Assessment & Plan Note (Signed)
Summary: [facility] -2 WK FU--STC   Vital Signs:  Patient profile:   70 year old female Height:      63 inches Weight:      189 pounds BMI:     33.60 O2 Sat:      95 % on Room air Temp:     98.4 degrees F oral Pulse rate:   93 / minute BP sitting:   136 / 70  (left arm) Cuff size:   regular  Vitals Entered By: Charlynne Cousins CMA (June 26, 2010 1:09 PM)  O2 Flow:  Room air CC: pt here for follow up office visit after being discharged from rehab for back surg. Pt also wants MD to look at her right leg where she had cut it./ ab Comments Pt would like to discuss Azor medication/ ab   Primary Care Provider:  norins  CC:  pt here for follow up office visit after being discharged from rehab for back surg. Pt also wants MD to look at her right leg where she had cut it./ ab.  History of Present Illness: Patinet has had lumbar surgery at [facility] and 5 weeks at [facility] for Rehab. She was released September 22nd. After a fall 3 weeks ago she did return to [facility] and had x-rays with no disturbance to repair. She will be seeing Dr. Harl Bowie Nov 16th for follow-up.   Reviewed all meds and up-dated medication record.  She continues to receive in-home care: physical therapy and nursing. She is independent in ADLs  but has to be careful with lifting. she is very thoughtful about fall risk.- right leg weakness and she is working with PT to strengthen right leg.   She cut her leg- distal RLL. She went to ED and had wound care with steri-strips and wound glue. Records reviewed for Sept 27th and 28th.   No new medical problems otherwise and she is doing relatively well.   Current Medications (verified): 1)  Prolia 60 Mg/ml Soln (Denosumab) .Marland Kitchen.. 1 Injection Every 6 Months 2)  Iron Supplement 325 (65 Fe) Mg  Tabs (Ferrous Sulfate) .Marland Kitchen.. 1 By Mouth Two Times A Day 3)  Simvastatin 5 Mg  Tabs (Simvastatin) .Marland Kitchen.. 1 By Mouth Qd 4)  Pepcid Ac Maximum Strength 20 Mg  Tabs (Famotidine) .Marland Kitchen.. 1 By  Mouth Bid 5)  Prednisone 10 Mg Tabs (Prednisone) .... Take 1 and 1/2 Tab Daily (12.5) 6)  Ecotrin Low Strength 81 Mg  Tbec (Aspirin) .Marland Kitchen.. 1 By Mouth M-W-F 7)  Azor 5-20 Mg  Tabs (Amlodipine-Olmesartan) .Marland Kitchen.. 1 By Mouth Once Daily 8)  Furosemide 40 Mg  Tabs (Furosemide) .Marland Kitchen.. 1 By Mouth Once Daily 9)  Multivitamins   Tabs (Multiple Vitamin) .Marland Kitchen.. 1 By Mouth Daily 10)  Calcium/magnesium/zinc Formula 1000-500-50 Mg  Tabs (Calcium-Magnesium-Zinc) .... Take 1 Tab By Mouth At Bedtime 11)  Vitamin D 1000 Unit  Tabs (Cholecalciferol) .... Take 1 Tablet By Mouth Once A Day 12)  Oxycontin 30 Mg Xr12h-Tab (Oxycodone Hcl) .Marland Kitchen.. 1 By Mouth Two Times A Day 13)  Oxycodone Hcl 10 Mg Tabs (Oxycodone Hcl) 14)  Neurontin 300 Mg Caps (Gabapentin) .Marland Kitchen.. 1 By Mouth Two Times A Day 15)  Methocarbamol 750 Mg Tabs (Methocarbamol) .Marland Kitchen.. 1 Three Times A Day 16)  Senokot S 8.6-50 Mg Tabs (Sennosides-Docusate Sodium) .Marland Kitchen.. 1-2 Tabs By Mouth As Needed For Constipation  Allergies (verified): No Known Drug Allergies  Past History:  Past Medical History: Last updated: 05/18/2008 Breast cancer, hx of  Osteoporosis idiopathic pulmonary fibrosis bronchiectasis low vit d Hyperlipidemia Allergic rhinitis GERD left knee djd Anxiety migraines Diverticulosis, colon Seizure disorder add.prob Temporal Arteritis Cerebrovascular accident, hx of - right thalamic 2/09 temporal arteritis/right eye blind  on steroids per neurology  Dr [name] Diabetes mellitus, type II - secondary to steroid PMR Anemia-NOS - chronic disease/iron def pulmonary fibrosis - on chronic prednisone/dr wright gait disorder with peripheral neuropathy chronic leukocytosis hx of positive PTTLA/ titer Hypertension Renal insufficiency  Family History: Last updated: 10/08/2007 fathe with dementia, HTN sister with breast cancer mother with brain tumor, HTN  Social History: Last updated: 01/12/2009 Never Smoked Alcohol use-no widowed 8/09 1  child retired - Solicitor and Photographer  Past Surgical History: Mastectomy (L) Cholecystectomy s/p left total knee replacement Tonsillectomy Cataract extraction - OS Summer '10 L2-5 with fusion and mechanical fixation Aug '11 [facility])  Review of Systems       The patient complains of weight loss, peripheral edema, muscle weakness, and difficulty walking.  The patient denies anorexia, fever, weight gain, chest pain, syncope, dyspnea on exertion, abdominal pain, severe indigestion/heartburn, suspicious skin lesions, abnormal bleeding, and enlarged lymph nodes.         loss 20 lbs associated with surgery. Muscle weakness proximal right LE. Has peripheral neuropathy bilateral LE.   Physical Exam  General:  WNWD mildly overweight white female in no distress Head:  normocephalic and atraumatic.   Eyes:  vision grossly intact, pupils equal, and pupils round.   Neck:  supple.   Lungs:  normal respiratory effort and normal breath sounds.   Heart:  normal rate and regular rhythm.   Msk:  no joint tenderness, no joint swelling, no redness over joints, and no joint deformities.   Pulses:  2+ radial Extremities:  trace distal LE edema. Neurologic:  alert & oriented X3 and cranial nerves II-XII intact.  Uses aid with ambulation butis independent Skin:  skin tear distal RLE - outer dressing removed to reveal tefla over lateral aspect of tear with overlaying steri-strips. Scant coagulum over open part of tear. No odor to the wound Psych:  Oriented X3, memory intact for recent and remote, normally interactive, and good eye contact.     Impression & Recommendations:  Problem # 1:  VENOUS INSUFFICIENCY, CHRONIC (ICD-459.81) Mild edema. Not wearing support hose. No stasis dermatitis.  Plan - elevate legs as possible           knee high hose  Problem # 2:  LUMBAR RADICULOPATHY, LEFT (ICD-724.4) Patient reports that she is still having a lot of pain and continues  on narcotic medications. Her surgeon(s) advised that it may be 2-3 months before she sees reduction in her pain. She is mobile.  Plan - will assume responsibility for prescribing narcotics           patient executed pain contract - reviewed with her in detail before signature  Her updated medication list for this problem includes:    Ecotrin Low Strength 81 Mg Tbec (Aspirin) ..... Once daily    Oxycontin 30 Mg Xr12h-tab (Oxycodone hcl) .Marland Kitchen... 1 by mouth two times a day    Oxycodone Hcl 10 Mg Tabs (Oxycodone hcl) ..... Every 4 hours as needed breakthrough    Methocarbamol 750 Mg Tabs (Methocarbamol) .Marland Kitchen... 1 three times a day  Problem # 3:  WOUND, LEG (ICD-891.0) large skin tear. Left steri-strips in place. No evidence of infection - no indication for antibiotics.  Plan - order to [facility]  for RN to do wound check and dressing change at biweekly home visits.  Problem # 4:  ARTERITIS, GIANT CELL (ICD-446.5) Patient has been asymptomatic. She continues to take prednisone 10mg  daily. Last ESR July 12 was 67mm/hr  Plan - continue present meds  Problem # 5:  DIABETES MELLITUS, TYPE II (ICD-250.00) On no medications. Last available serum glucose 132 March 2, '11. No recent A1C.  Plan - A1C at next lab draw.  The following medications were removed from the medication list:    Azor 5-20 Mg Tabs (Amlodipine-olmesartan) .Marland Kitchen... 1 by mouth once daily Her updated medication list for this problem includes:    Ecotrin Low Strength 81 Mg Tbec (Aspirin) ..... Once daily  Problem # 6:  HYPERLIPIDEMIA (ICD-272.4) last lipid panel March 2, ,'11 - LDL = 78  Her updated medication list for this problem includes:    Simvastatin 5 Mg Tabs (Simvastatin) .Marland Kitchen... 1 by mouth qd  Complete Medication List: 1)  Prolia 60 Mg/ml Soln (Denosumab) .Marland Kitchen.. 1 injection every 6 months 2)  Iron Supplement 325 (65 Fe) Mg Tabs (Ferrous sulfate) .Marland Kitchen.. 1 by mouth two times a day 3)  Simvastatin 5 Mg Tabs (Simvastatin) .Marland Kitchen.. 1 by  mouth qd 4)  Pepcid Ac Maximum Strength 20 Mg Tabs (Famotidine) .Marland Kitchen.. 1 by mouth bid 5)  Prednisone 10 Mg Tabs (Prednisone) .... Take 1 tablet daily 6)  Ecotrin Low Strength 81 Mg Tbec (Aspirin) .... Once daily 7)  Furosemide 40 Mg Tabs (Furosemide) .Marland Kitchen.. 1 by mouth once daily 8)  Multivitamins Tabs (Multiple vitamin) .Marland Kitchen.. 1 by mouth daily 9)  Calcium/magnesium/zinc Formula 1000-500-50 Mg Tabs (Calcium-magnesium-zinc) .... Take 1 tab by mouth at bedtime 10)  Vitamin D 1000 Unit Tabs (Cholecalciferol) .... Take 1 tablet by mouth once a day 11)  Oxycontin 30 Mg Xr12h-tab (Oxycodone hcl) .Marland Kitchen.. 1 by mouth two times a day 12)  Oxycodone Hcl 10 Mg Tabs (Oxycodone hcl) .... Every 4 hours as needed breakthrough 13)  Neurontin 300 Mg Caps (Gabapentin) .Marland Kitchen.. 1 by mouth two times a day 14)  Methocarbamol 750 Mg Tabs (Methocarbamol) .Marland Kitchen.. 1 three times a day 15)  Senokot S 8.6-50 Mg Tabs (Sennosides-docusate sodium) .Marland Kitchen.. 1-2 tabs by mouth as needed for constipation 16)  Prednisone 2.5 Mg Tabs (Prednisone) .Marland Kitchen.. 1 by mouth once daily Prescriptions: OXYCODONE HCL 10 MG TABS (OXYCODONE HCL) every 4 hours as needed breakthrough  #360 x 0   Entered and Authorized by:   Neena Rhymes MD   Signed by:   Neena Rhymes MD on 06/26/2010   Method used:   Handwritten   [id number] OXYCONTIN 30 MG XR12H-TAB (OXYCODONE HCL) 1 by mouth two times a day  #180 x 0   Entered and Authorized by:   Neena Rhymes MD   Signed by:   Neena Rhymes MD on 06/26/2010   Method used:   Handwritten   [id number] PREDNISONE 2.5 MG TABS (PREDNISONE) 1 by mouth once daily  #90 x 3   Entered and Authorized by:   Neena Rhymes MD   Signed by:   Neena Rhymes MD on 06/26/2010   Method used:   Electronically to        [facility]  [phone]* (retail)       [address]       [place], [place]  [address]       Ph:  [phone] or [phone]       Fax:  [phone]   RxIDOG:[id number] METHOCARBAMOL 750 MG TABS (METHOCARBAMOL) 1 three times a day  #270 x 0   Entered and Authorized by:   Neena Rhymes MD   Signed by:   Neena Rhymes MD on 06/26/2010   Method used:   Electronically to        [facility]  [phone]* (retail)       [address]       [place], [place]  [address]       Ph: [phone] or [phone]       Fax: [phone]   RxID:   [id number] NEURONTIN 300 MG CAPS (GABAPENTIN) 1 by mouth two times a day  #180 x 3   Entered and Authorized by:   Neena Rhymes MD   Signed by:   Neena Rhymes MD on 06/26/2010   Method used:   Electronically to        [facility]  [phone]* (retail)       [address]       [place], [place]  [address]       Ph: [phone] or [phone]       Fax: [phone]   RxIDIN:[id number] FUROSEMIDE 40 MG  TABS (FUROSEMIDE) 1 by mouth once daily  #90 x 3   Entered and Authorized by:   Neena Rhymes MD   Signed by:   Neena Rhymes MD on 06/26/2010   Method used:   Electronically to        [facility]  [phone]* (retail)       [address]       [place], [place]  [address]       Ph: [phone] or [phone]       Fax: [phone]   RxIDFU:[id number] PREDNISONE 10 MG TABS (PREDNISONE) Take 1 tablet daily  #90 x 3   Entered and Authorized by:   Neena Rhymes MD   Signed by:   Neena Rhymes MD on 06/26/2010   Method used:   Electronically to        [facility]  [phone]* (retail)       [address]       [place], [place]  [address]       Ph: [phone] or [phone]       Fax: [phone]   RxIDVU:[id number] SIMVASTATIN 5 MG  TABS (SIMVASTATIN) 1 by mouth qd  #90 x 3   Entered and Authorized by:   Neena Rhymes MD   Signed by:   Neena Rhymes MD on 06/26/2010   Method used:    Electronically to        [facility]  [phone]* (retail)       [address]       [place], [place]  [address]       Ph: [phone] or [phone]       Fax: [phone]   RxID:   [id number]    Immunization History:  Influenza Immunization History:    Influenza:  0.28ml lot [id number] exp 02/2011 (06/20/2010)

## 2010-10-24 NOTE — Miscellaneous (Signed)
Summary: Plan/[facility]  Plan/[facility]   Imported By: Bubba Hales 08/29/2010 09:52:15  _____________________________________________________________________  External Attachment:    Type:   Image     Comment:   External Document

## 2010-10-24 NOTE — Progress Notes (Signed)
Summary: FYI - BP UPDATE  Phone Note Call from Patient   Summary of Call: Pt called back w/update. She did discuss her concerns w/facility MD & Director of Nursing. They will be checking BP once daily and have told her the amount of pain medication may have caused lower BP. She is going to decrease and "spread out" her pain medications.  Initial call taken by: Charlsie Quest, [place],  May 23, 2010 3:33 PM  Follow-up for Phone Call        k Follow-up by: Neena Rhymes MD,  May 23, 2010 6:02 PM

## 2010-10-24 NOTE — Assessment & Plan Note (Signed)
Summary: EST PT C/O BLOOD IN URINE/KB   Vital Signs:  Patient profile:   70 year old female Height:      63 inches Weight:      216 pounds BMI:     38.40 O2 Sat:      94 % on Room air Temp:     98.4 degrees F oral Pulse rate:   86 / minute BP sitting:   102 / 62  (left arm) Cuff size:   large  Vitals Entered By: Charlynne Cousins CMA (November 10, 2009 4:12 PM)  O2 Flow:  Room air CC: pt here with complaint of blood in her urine x 2 days/ pt states her prednisone she now takes 12.5mg / ab   Primary Care Provider:  norins  CC:  pt here with complaint of blood in her urine x 2 days/ pt states her prednisone she now takes 12.5mg / ab.  History of Present Illness: Patient well known to me. She is on aspsirin. She reports having had frank hematuria. She is on prednisone and is preparing for cervical spine surgery. She has had no dysuria, abdominal pain or other symptoms to suggest UTI or stone disease. She has not had persistent bleeding.  Current Medications (verified): 1)  Boniva 150 Mg  Tabs (Ibandronate Sodium) .Marland Kitchen.. 1 By Mouth Q Month 2)  Iron Supplement 325 (65 Fe) Mg  Tabs (Ferrous Sulfate) .Marland Kitchen.. 1 By Mouth Two Times A Day 3)  Simvastatin 5 Mg  Tabs (Simvastatin) .Marland Kitchen.. 1 By Mouth Qd 4)  Pepcid Ac Maximum Strength 20 Mg  Tabs (Famotidine) .Marland Kitchen.. 1 By Mouth Bid 5)  Prednisone 10 Mg Tabs (Prednisone) .... Take 1 Tablet By Mouth Once A Day 6)  Ecotrin Low Strength 81 Mg  Tbec (Aspirin) .Marland Kitchen.. 1 By Mouth M-W-F 7)  Azor 5-20 Mg  Tabs (Amlodipine-Olmesartan) .Marland Kitchen.. 1 By Mouth Once Daily 8)  Furosemide 40 Mg  Tabs (Furosemide) .Marland Kitchen.. 1 By Mouth Once Daily 9)  Multivitamins   Tabs (Multiple Vitamin) .Marland Kitchen.. 1 By Mouth Daily 10)  Calcium/magnesium/zinc Formula 1000-500-50 Mg  Tabs (Calcium-Magnesium-Zinc) .... Take 1 Tab By Mouth At Bedtime 11)  Vitamin D 1000 Unit  Tabs (Cholecalciferol) .... Take 1 Tablet By Mouth Once A Day 12)  Oxycontin 20 Mg Xr12h-Tab (Oxycodone Hcl) .Marland Kitchen.. 1 By Mouth Two Times A  Day 13)  Oxycodone Hcl 5 Mg Tabs (Oxycodone Hcl) .Marland Kitchen.. 1 By Mouth Every 6 Hours As Needed 14)  Neurontin 300 Mg Caps (Gabapentin) .Marland Kitchen.. 1 Daily  Allergies (verified): No Known Drug Allergies PMH-FH-SH reviewed-no changes except otherwise noted  Review of Systems       The patient complains of hematuria.  The patient denies anorexia, fever, dyspnea on exertion, incontinence, and abnormal bleeding.    Physical Exam  General:  Well-developed,well-nourished,in no acute distress; alert,appropriate and cooperative throughout examination Neck:  supple.   Lungs:  normal respiratory effort and normal breath sounds.   Heart:  normal rate and regular rhythm.   Abdomen:  non-tender.     Impression & Recommendations:  Problem # 1:  GROSS HEMATURIA (ICD-599.71) Patient with gross painless hematuria. Discussed differential diagnosis including bladder neoplasm. She is facing surgery in the near term.  Plan - Urology referral after c-spine surgery - April '11  Complete Medication List: 1)  Boniva 150 Mg Tabs (Ibandronate sodium) .Marland Kitchen.. 1 by mouth q month 2)  Iron Supplement 325 (65 Fe) Mg Tabs (Ferrous sulfate) .Marland Kitchen.. 1 by mouth two times a day 3)  Simvastatin  5 Mg Tabs (Simvastatin) .Marland Kitchen.. 1 by mouth qd 4)  Pepcid Ac Maximum Strength 20 Mg Tabs (Famotidine) .Marland Kitchen.. 1 by mouth bid 5)  Prednisone 10 Mg Tabs (Prednisone) .... Take 1 tablet by mouth once a day 6)  Ecotrin Low Strength 81 Mg Tbec (Aspirin) .Marland Kitchen.. 1 by mouth m-w-f 7)  Azor 5-20 Mg Tabs (Amlodipine-olmesartan) .Marland Kitchen.. 1 by mouth once daily 8)  Furosemide 40 Mg Tabs (Furosemide) .Marland Kitchen.. 1 by mouth once daily 9)  Multivitamins Tabs (Multiple vitamin) .Marland Kitchen.. 1 by mouth daily 10)  Calcium/magnesium/zinc Formula 1000-500-50 Mg Tabs (Calcium-magnesium-zinc) .... Take 1 tab by mouth at bedtime 11)  Vitamin D 1000 Unit Tabs (Cholecalciferol) .... Take 1 tablet by mouth once a day 12)  Oxycontin 20 Mg Xr12h-tab (Oxycodone hcl) .Marland Kitchen.. 1 by mouth two times a  day 13)  Oxycodone Hcl 5 Mg Tabs (Oxycodone hcl) .Marland Kitchen.. 1 by mouth every 6 hours as needed 14)  Neurontin 300 Mg Caps (Gabapentin) .Marland Kitchen.. 1 daily  Laboratory Results   Urine Tests   Date/Time Reported: 11/10/2009 4:20pm  Routine Urinalysis   Color: lt. yellow Appearance: Clear Glucose: negative   (Normal Range: Negative) Bilirubin: negative   (Normal Range: Negative) Ketone: negative   (Normal Range: Negative) Spec. Gravity: <1.005   (Normal Range: 1.003-1.035) Blood: trace-lysed   (Normal Range: Negative) pH: 5.0   (Normal Range: 5.0-8.0) Protein: negative   (Normal Range: Negative) Urobilinogen: 0.2   (Normal Range: 0-1) Nitrite: negative   (Normal Range: Negative) Leukocyte Esterace: negative   (Normal Range: Negative)

## 2010-10-24 NOTE — Letter (Signed)
Summary: Neurosurgery/[facility]  Neurosurgery/[facility]   Imported By: Phillis Knack 06/30/2010 08:16:30  _____________________________________________________________________  External Attachment:    Type:   Image     Comment:   External Document

## 2010-10-24 NOTE — Miscellaneous (Signed)
Summary: Order/[facility]  Order[facility]   Imported By: Bubba Hales 08/30/2010 09:10:36  _____________________________________________________________________  External Attachment:    Type:   Image     Comment:   External Document

## 2010-10-24 NOTE — Progress Notes (Signed)
Summary: LIVER  Phone Note Call from Patient Call back at Home Phone [phone]   Summary of Call: Patient is requesting to have her liver checked along w/sed rate. She is concerned that being on prednisone could affect her liver. Please advise.  Initial call taken by: Charlsie Quest, [place],  April 03, 2010 2:41 PM  Follow-up for Phone Call        OK to add hepatic 995.20. It is very unlikely that steroids would affect the liver.  Follow-up by: Neena Rhymes MD,  April 03, 2010 6:12 PM  Additional Follow-up for Phone Call Additional follow up Details #1::        no answer...............Marland KitchenCharlsie Quest, CMA  April 04, 2010 12:10 PM   Patient notified, please do add on form. Ernestene Mention  April 04, 2010 4:37 PM   Unable to add to labs. Pt needs to come back in for additional labs. LEft detailed vm for pt on hm #  Additional Follow-up by: Charlsie Quest, CMA,  April 05, 2010 12:13 PM

## 2010-10-24 NOTE — Miscellaneous (Signed)
Summary: Medication Issue/[facility]  Medication Issue/[facility]   Imported By: Phillis Knack 06/23/2010 14:58:16  _____________________________________________________________________  External Attachment:    Type:   Image     Comment:   External Document

## 2010-10-24 NOTE — Miscellaneous (Signed)
Summary: Orders/[facility]  Orders/[facility]   Imported By: Phillis Knack 08/02/2010 10:15:08  _____________________________________________________________________  External Attachment:    Type:   Image     Comment:   External Document

## 2010-10-24 NOTE — Letter (Signed)
Summary: [facility]   Imported By: Bubba Hales 11/10/2009 08:04:53  _____________________________________________________________________  External Attachment:    Type:   Image     Comment:   External Document

## 2010-10-24 NOTE — Letter (Signed)
Summary: Colonoscopy Letter  [facility]  [place], [place] [address]   Phone: [phone]  Fax: [phone]      October 21, 2009 MRN: [id number]   [place], [place]  [address]   Dear Ms. Temkin,   According to your medical record, it is time for you to schedule a Colonoscopy. The American Cancer Society recommends this procedure as a method to detect early colon cancer. Patients with a family history of colon cancer, or a personal history of colon polyps or inflammatory bowel disease are at increased risk.  This letter has beeen generated based on the recommendations made at the time of your procedure. If you feel that in your particular situation this may no longer apply, please contact our office.  Please call our office at [phone] to schedule this appointment or to update your records at your earliest convenience.  Thank you for cooperating with [place] to provide you with the very best care possible.   Sincerely,  Sandy Salaam. Deatra Ina, M.D.  [facility] [phone]

## 2010-10-24 NOTE — Miscellaneous (Signed)
Summary: Order/[facility]  Order/[facility]   Imported By: Bubba Hales 08/30/2010 08:58:47  _____________________________________________________________________  External Attachment:    Type:   Image     Comment:   External Document

## 2010-10-24 NOTE — Letter (Signed)
Summary: [facility]  [facility]   Imported By: Phillis Knack 02/01/2010 09:07:31  _____________________________________________________________________  External Attachment:    Type:   Image     Comment:   External Document

## 2010-10-24 NOTE — Letter (Signed)
Summary: Neurosurgery/[facility]  Neurosurgery/[facility]   Imported By: Phillis Knack 11/16/2009 08:20:48  _____________________________________________________________________  External Attachment:    Type:   Image     Comment:   External Document

## 2010-10-24 NOTE — Assessment & Plan Note (Signed)
Summary: FOOT PAIN/ GOUT? /NWS   Vital Signs:  Patient profile:   70 year old female Height:      63 inches Weight:      212 pounds BMI:     37.69 O2 Sat:      95 % on Room air Temp:     98.5 degrees F oral Pulse rate:   84 / minute BP sitting:   122 / 60  (left arm) Cuff size:   large  Vitals Entered By: Charlynne Cousins CMA (October 20, 2009 11:36 AM)  O2 Flow:  Room air CC: pt c/o pain in left foot/ ab   Primary Care Provider:  Hilja Kintzel  CC:  pt c/o pain in left foot/ ab.  History of Present Illness: patient c/o pain in the left foot with suddent on-set; pain with weight bearing that gets a little better with standing. No injury or previous foot problems. She does not report erythema or exquisite joint tenderness.  Current Medications (verified): 1)  Boniva 150 Mg  Tabs (Ibandronate Sodium) .Marland Kitchen.. 1 By Mouth Q Month 2)  Iron Supplement 325 (65 Fe) Mg  Tabs (Ferrous Sulfate) .Marland Kitchen.. 1 By Mouth Two Times A Day 3)  Simvastatin 5 Mg  Tabs (Simvastatin) .Marland Kitchen.. 1 By Mouth Qd 4)  Pepcid Ac Maximum Strength 20 Mg  Tabs (Famotidine) .Marland Kitchen.. 1 By Mouth Bid 5)  Prednisone 10 Mg Tabs (Prednisone) .... Take 1 Tablet By Mouth Once A Day 6)  Ecotrin Low Strength 81 Mg  Tbec (Aspirin) .Marland Kitchen.. 1 By Mouth M-W-F 7)  Azor 5-20 Mg  Tabs (Amlodipine-Olmesartan) .Marland Kitchen.. 1 By Mouth Once Daily 8)  Furosemide 40 Mg  Tabs (Furosemide) .Marland Kitchen.. 1 By Mouth Once Daily 9)  Multivitamins   Tabs (Multiple Vitamin) .Marland Kitchen.. 1 By Mouth Daily 10)  Calcium/magnesium/zinc Formula 1000-500-50 Mg  Tabs (Calcium-Magnesium-Zinc) .... Take 1 Tab By Mouth At Bedtime 11)  Vitamin D 1000 Unit  Tabs (Cholecalciferol) .... Take 1 Tablet By Mouth Once A Day 12)  Oxycontin 20 Mg Xr12h-Tab (Oxycodone Hcl) .Marland Kitchen.. 1 By Mouth Two Times A Day 13)  Oxycodone Hcl 5 Mg Tabs (Oxycodone Hcl) .Marland Kitchen.. 1 By Mouth Every 6 Hours As Needed 14)  Clotrimazole-Betamethasone 1-0.05 % Crea (Clotrimazole-Betamethasone) .... Apply A Small Amount To Lesion Three Times A Day 15)   Neurontin 300 Mg Caps (Gabapentin) .Marland Kitchen.. 1 Daily  Allergies (verified): No Known Drug Allergies  Past History:  Past Medical History: Last updated: 05/18/2008 Breast cancer, hx of Osteoporosis idiopathic pulmonary fibrosis bronchiectasis low vit d Hyperlipidemia Allergic rhinitis GERD left knee djd Anxiety migraines Diverticulosis, colon Seizure disorder add.prob Temporal Arteritis Cerebrovascular accident, hx of - right thalamic 2/09 temporal arteritis/right eye blind  on steroids per neurology  Dr [name] Diabetes mellitus, type II - secondary to steroid PMR Anemia-NOS - chronic disease/iron def pulmonary fibrosis - on chronic prednisone/dr wright gait disorder with peripheral neuropathy chronic leukocytosis hx of positive PTTLA/ titer Hypertension Renal insufficiency  Past Surgical History: Last updated: 07/14/2009 Mastectomy (L) Cholecystectomy s/p left total knee replacement Tonsillectomy Cataract extraction - OS Summer '10  Family History: Last updated: 10/08/2007 fathe with dementia, HTN sister with breast cancer mother with brain tumor, HTN  Social History: Last updated: 01/12/2009 Never Smoked Alcohol use-no widowed 8/09 1 child retired - Solicitor and Phot[employer]  Risk Factors: Smoking Status: never (02/23/2008)  Review of Systems       The patient complains of difficulty walking.  The patient denies anorexia, fever, weight loss,  decreased hearing, chest pain, dyspnea on exertion, headaches, abdominal pain, hematochezia, muscle weakness, unusual weight change, and angioedema.    Physical Exam  General:  heavyset white female in no distress. Head:  Normocephalic and atraumatic without obvious abnormalities. No apparent alopecia or balding. Lungs:  normal respiratory effort and normal breath sounds.   Heart:  normal rate and regular rhythm.   Msk:  point tenderness left heel plantar aspect. Joints all normal. Normal ROM  ankle.   Impression & Recommendations:  Problem # 1:  PLANTAR FASCIITIS (ICD-728.71) Patient with classic presentation of plantar fasciitis.  Plan - CareNotes handout provided on diagnosis and stretches           orthotics           may add aleve, etc. to her current regimen, which includes prednisone, at low dose short term.            for failure to improve she will return for steroid injection.   Complete Medication List: 1)  Boniva 150 Mg Tabs (Ibandronate sodium) .Marland Kitchen.. 1 by mouth q month 2)  Iron Supplement 325 (65 Fe) Mg Tabs (Ferrous sulfate) .Marland Kitchen.. 1 by mouth two times a day 3)  Simvastatin 5 Mg Tabs (Simvastatin) .Marland Kitchen.. 1 by mouth qd 4)  Pepcid Ac Maximum Strength 20 Mg Tabs (Famotidine) .Marland Kitchen.. 1 by mouth bid 5)  Prednisone 10 Mg Tabs (Prednisone) .... Take 1 tablet by mouth once a day 6)  Ecotrin Low Strength 81 Mg Tbec (Aspirin) .Marland Kitchen.. 1 by mouth m-w-f 7)  Azor 5-20 Mg Tabs (Amlodipine-olmesartan) .Marland Kitchen.. 1 by mouth once daily 8)  Furosemide 40 Mg Tabs (Furosemide) .Marland Kitchen.. 1 by mouth once daily 9)  Multivitamins Tabs (Multiple vitamin) .Marland Kitchen.. 1 by mouth daily 10)  Calcium/magnesium/zinc Formula 1000-500-50 Mg Tabs (Calcium-magnesium-zinc) .... Take 1 tab by mouth at bedtime 11)  Vitamin D 1000 Unit Tabs (Cholecalciferol) .... Take 1 tablet by mouth once a day 12)  Oxycontin 20 Mg Xr12h-tab (Oxycodone hcl) .Marland Kitchen.. 1 by mouth two times a day 13)  Oxycodone Hcl 5 Mg Tabs (Oxycodone hcl) .Marland Kitchen.. 1 by mouth every 6 hours as needed 14)  Clotrimazole-betamethasone 1-0.05 % Crea (Clotrimazole-betamethasone) .... Apply a small amount to lesion three times a day 15)  Neurontin 300 Mg Caps (Gabapentin) .Marland Kitchen.. 1 daily  Patient Instructions: 1)  Plantar fascititis - a tendonitis of the heel. Plan - supportive shoes, inserts for weight redistribution (may want to check at the [employer] [address] for off the rack inserts), streteches and over the counter anti-inflammatory like aleve 1 tablet AM and PM while you  hurt.   Preventive Care Screening  Mammogram:    Date:  08/24/2009    Results:  normal   Last Flu Shot:    Date:  07/07/2009    Results:  given

## 2010-10-24 NOTE — Progress Notes (Signed)
Summary: Sed Rate  Phone Note Call from Patient Call back at Home Phone [phone]   Summary of Call: Patient is requesting lab order for sed rate for 2 wks for now. (unsure what lab, she is living w/sister in [place] while recovering from surgery) OK for order?  Initial call taken by: Charlsie Quest, [place],  December 19, 2009 3:15 PM  Follow-up for Phone Call        OK for ESR diagnosis is temporal arteritis 446.5 Follow-up by: Neena Rhymes MD,  December 19, 2009 6:14 PM  Additional Follow-up for Phone Call Additional follow up Details #1::        Pt informed, order faxed to [facility] hospital lab Additional Follow-up by: Charlsie Quest, [place],  December 20, 2009 9:30 AM

## 2010-10-24 NOTE — Letter (Signed)
Summary: Controlled Substances Contract  [facility] [place], [place] [address]   Phone: [phone]  Fax: [phone]    [facility] Controlled Substances Contract         Patient Name: [name] Patient DOB: Sep 26, 1940        Patient MRN:  [id number]        Physician's Name: __Michael Norins_______________________________________   Patients must complete this contract before doctors at the [facility] office will be willing to prescribe controlled substances. I understand that: ___1)  I am responsible for my controlled substance medications.  If my prescription is lost, misplaced or stolen, or if I take more than prescribed, my doctor will not write me a new prescription. ___2)  I will not request or accept controlled substances or controlled substance prescriptions from any other doctor or clinic while I am receiving controlled substance treatment at [facility].  The ONLY exception is if controlled substances are prescribed for the treatment of an acute condition that is NOT the diagnosis for which I am receiving treatment at [facility].   I will call my physician at [facility] if I receive controlled substance or controlled substance prescriptions from anywhere else. ___3)  Controlled substance refills will be made ONLY during regular office hours. ___4)  Refills will not be made if I run out early.  Refills will not be made during work-in or urgent care visits.  Refills will NOT be made for "emergencies", such as on a Friday afternoon or by on call service at night or weekends.  I understand that I am required to call at least 2 business days prior to expiration date for controlled substance and/or needing controlled substance refills.   ___5)  I will not use illicit (illegal) drugs.  ___6)  I agree to take urine or blood drug tests when requested for routine screening. ___7)  I agree to use only ONE  pharmacy for filling ALL my controlled substance prescriptions.             Name and Location of Pharmacy:                                                                                                                  ___8)  I understand that my doctor may review my use of controlled substances using the [facility] Controlled Substance Reporting System. ___9)  If I behave in an abusive way towards [facility] staff, my controlled substance prescriptions may be stopped, and I may be dismissed from this practice. ___10)  I understand that if I break any of the above terms of this contract, my pain prescription and/or treatment may be stopped immediately.  If I get controlled substances from someone else or use illegal drugs, I may be reported to all my doctors, medical facilities and appropriate authorities.  I have been fully informed by [facility] physicians and the staff regarding psychological dependence (addiction) to controlled  substances.  I understand that I should stop my medication ONLY under medical supervision or I may have withdrawal symptoms.  ***I have read this contract and it has been explained to me by [facility] physicians and/or their staff, and I fully understand the consequences of violating any of the terms of this contract.    Patient Signature _________________________________________ Date June 26, 2010   [facility] Staff Signature ____________________________________ Date June 26, 2010

## 2010-10-24 NOTE — Progress Notes (Signed)
Summary: LAB ORDER - Sed rate   Phone Note Call from Patient   Summary of Call: Patient is requesting lab order for sed rate, OK?  Initial call taken by: Charlsie Quest, [place],  April 03, 2010 9:46 AM  Follow-up for Phone Call        OK for sed rate, temporal arteritis Follow-up by: Neena Rhymes MD,  April 03, 2010 1:45 PM  Additional Follow-up for Phone Call Additional follow up Details #1::        Patient notified. Additional Follow-up by: Ernestene Mention,  April 03, 2010 2:18 PM

## 2010-10-24 NOTE — Letter (Signed)
Summary: [facility]  [facility]   Imported By: Phillis Knack 07/04/2010 14:47:47  _____________________________________________________________________  External Attachment:    Type:   Image     Comment:   External Document

## 2010-10-24 NOTE — Progress Notes (Signed)
  Phone Note Refill Request Message from:  Fax from Pharmacy on Feb 21, 2010 9:11 AM  Refills Requested: Medication #1:  VITAMIN D 1000 UNIT  TABS Take 1 tablet by mouth once a day Initial call taken by: Ami Bullins CMA,  Feb 21, 2010 9:11 AM    Prescriptions: VITAMIN D 1000 UNIT  TABS (CHOLECALCIFEROL) Take 1 tablet by mouth once a day  #30 Each x 4   Entered by:   Ami Bullins CMA   Authorized by:   Neena Rhymes MD   Signed by:   Charlynne Cousins CMA on 02/21/2010   Method used:   Electronically to        [facility]  [phone]* (retail)       [address]       [place], [place]  [address]       Ph: [phone] or [phone]       Fax: [phone]   RxID:   [id number]

## 2010-10-24 NOTE — Progress Notes (Signed)
Summary: Home Health  Phone Note From Other Clinic   [facility] [phone] Summary of Call: Home health called. Pt's bp has running low over the past few days. Today 90/52. Pt c/o fatigue. Pt is currently on azor, oxycontin 30mg  q12 and oxycodone 10mg  two times a day. Should pt hold or stop azor?  Initial call taken by: Charlsie Quest, [place],  July 20, 2010 3:29 PM  Follow-up for Phone Call        Hold azor for 3 days, watch BP, If BP runs up then restart azor.Marland Kitchen..Marland Kitchenif BP runs too low again then will switch to benicar alone.  Follow-up by: Neena Rhymes MD,  July 21, 2010 9:50 AM  Additional Follow-up for Phone Call Additional follow up Details #1::        Left vm for leslie and spoke with pt, she understands and will call office back w/update later Additional Follow-up by: Charlsie Quest, CMA,  July 21, 2010 10:07 AM    New/Updated Medications: AZOR 5-20 MG TABS (AMLODIPINE-OLMESARTAN) 1 once daily for blood pressure-HOLD X 3 DAYS (07/22/2010)

## 2010-10-24 NOTE — Progress Notes (Signed)
Summary: Rx request   Phone Note Other Incoming Call back at Home Phone [phone]   Caller: Magda Paganini @ [name] [phone] Summary of Call: I am treating wound on right leg. It has yellow sloth.  Requesting Rx Santyl. Please advise Initial call taken by: Jonathon Resides, [facility]),  July 24, 2010 4:14 PM  Follow-up for Phone Call        ok for santyl, which I assume is some type of proteolytic dressing. Follow-up by: Neena Rhymes MD,  July 24, 2010 9:49 PM  Additional Follow-up for Phone Call Additional follow up Details #1::        HH Magda Paganini is requesting Rx for Santyl to pt's pharmacy, [facility] Additional Follow-up by: Crissie Sickles, [place],  July 25, 2010 12:56 PM    Additional Follow-up for Phone Call Additional follow up Details #2::    santyl is not in my ePocrates. They need to tell me how it is sold: quantity etc.  Follow-up by: Neena Rhymes MD,  July 25, 2010 2:41 PM  Additional Follow-up for Phone Call Additional follow up Details #3:: Details for Additional Follow-up Action Taken: [facility] and spoke with pharmacy. They advised that Santyl  is 250units//gram and comes in a 30mg  tube.Marland KitchenMarland KitchenMarland KitchenEllison Hughs Archie CMA  July 25, 2010 3:16 PM   OK to call in prescription as above. THANKS Additional Follow-up by: Neena Rhymes MD,  July 25, 2010 4:54 PM  New/Updated Medications: SANTYL 250 UNIT/GM OINT (COLLAGENASE) use as directed on affected area Prescriptions: SANTYL 250 UNIT/GM OINT (COLLAGENASE) use as directed on affected area  #30gram x 1   Entered by:   Crissie Sickles, CMA   Authorized by:   Neena Rhymes MD   Signed by:   Crissie Sickles, CMA on 07/26/2010   Method used:   Electronically to        [facility]  [phone]* (retail)       [address]       [place], [place]  [address]       Ph: [phone] or [phone]       Fax: [phone]   RxID:    [id number]

## 2010-10-24 NOTE — Progress Notes (Signed)
Summary: Low BP  Phone Note Call from Patient Call back at [phone]   Summary of Call: Patient is currently at [facility], since back surgery at [place] c/o low BP medication and she refused her Azor since Saturday. BP was 80/30 and 78/56. She wanted Dr Linda Hedges to be aware. She requested to be seen by MD there at [facility] but has not been able to be worked into schedule yet.   Pt wanted to know if it was ok to have taken herself off of BP medication? Also c/o weakness which is causing her to have touble w/physical therapy. Patient is requesting a call from MD if possible, see cell # above.  Initial call taken by: Charlsie Quest, [place],  May 22, 2010 9:56 AM  Follow-up for Phone Call        readings noted. OK to have stopped meds. If facility MD can't see her we can see her in the office.  Follow-up by: Neena Rhymes MD,  May 22, 2010 12:35 PM  Additional Follow-up for Phone Call Additional follow up Details #1::        Left vm on pt's cell # to call office back to schedule apt for office visit if she can not get eval from MD at facility.  Additional Follow-up by: Charlsie Quest, [place],  May 23, 2010 9:39 AM

## 2010-10-24 NOTE — Progress Notes (Signed)
Summary: FYI  Phone Note Call from Patient Call back at [facility] Phone [phone]   Caller: Patient Summary of Call: FYI...Marland KitchenMarland KitchenMarland Kitchen Patient just wanted MD to know that she is scheduled  to have surgery @ [facility] Initial call taken by: Estell Harpin CMA,  August 11, 2010 12:03 PM

## 2010-10-24 NOTE — Progress Notes (Signed)
Summary: Verbal Order  Phone Note From Other Clinic   Caller: [place] [phone] Summary of Call: [name] is req verbal ok to continue physical therapy services. OK?  Initial call taken by: Charlsie Quest, [place],  July 17, 2010 5:01 PM  Follow-up for Phone Call        OK for renewal of [facility] PT Follow-up by: Neena Rhymes MD,  July 17, 2010 5:35 PM  Additional Follow-up for Phone Call Additional follow up Details #1::        LEft vm for therapist Additional Follow-up by: Charlsie Quest, CMA,  July 18, 2010 11:20 AM

## 2010-10-24 NOTE — Progress Notes (Signed)
  Phone Note Refill Request Message from:  Fax from Pharmacy on Feb 09, 2010 8:54 AM  Refills Requested: Medication #1:  VITAMIN D 1000 UNIT  TABS Take 1 tablet by mouth once a day Initial call taken by: Ami Bullins CMA,  Feb 09, 2010 8:54 AM    Prescriptions: VITAMIN D 1000 UNIT  TABS (CHOLECALCIFEROL) Take 1 tablet by mouth once a day  #30 Each x 4   Entered by:   Ami Bullins CMA   Authorized by:   Neena Rhymes MD   Signed by:   Charlynne Cousins CMA on 02/09/2010   Method used:   Electronically to        [facility]  [phone]* (retail)       [address]       [place], [place]  [address]       Ph: [phone] or [phone]       Fax: [phone]   RxID:   [id number]

## 2010-10-24 NOTE — Progress Notes (Signed)
Summary: RESULTS  Phone Note Call from Patient   Summary of Call: Patient is requesting sed rate results. Had labs a [facility], to be faxed soon.  Initial call taken by: Charlsie Quest, [place],  January 03, 2010 2:04 PM  Follow-up for Phone Call        patient left message on triage requesting sed rate results. Please advise.  Left message on voicemail notifying patient that the results have not been received at this time. Follow-up by: Ernestene Mention,  January 04, 2010 2:09 PM  Additional Follow-up for Phone Call Additional follow up Details #1::        sed rate is 33. Please advise. Additional Follow-up by: Ernestene Mention,  January 04, 2010 3:05 PM    Additional Follow-up for Phone Call Additional follow up Details #2::    Patient will need to talk with Dr. [name] about adjusting her prednisone dose.  Follow-up by: Neena Rhymes MD,  January 06, 2010 4:04 AM  Additional Follow-up for Phone Call Additional follow up Details #3:: Details for Additional Follow-up Action Taken: Pt informed  Additional Follow-up by: Charlsie Quest, CMA,  January 06, 2010 8:24 AM

## 2010-10-24 NOTE — Progress Notes (Signed)
Summary: RESULTS  Phone Note Call from Patient   Summary of Call: Advised pt of sed rate result. She will inform Dr [name] & I faxed results to his office.  Initial call taken by: Charlsie Quest, [place],  April 05, 2010 3:29 PM

## 2010-10-24 NOTE — Progress Notes (Signed)
       New/Updated Medications: PROLIA 60 MG/ML SOLN (DENOSUMAB) 1 injection every 6 months PREDNISONE 10 MG TABS (PREDNISONE) Take 1 and 1/2 tab daily (12.5) OXYCONTIN 30 MG XR12H-TAB (OXYCODONE HCL) 1 by mouth two times a day NEURONTIN 300 MG CAPS (GABAPENTIN) 1 by mouth two times a day METHOCARBAMOL 750 MG TABS (METHOCARBAMOL) 1 three times a day SENOKOT S 8.6-50 MG TABS (SENNOSIDES-DOCUSATE SODIUM) 1-2 tabs by mouth as needed for constipation    Current Medications (verified): 1)  Prolia 60 Mg/ml Soln (Denosumab) .Marland Kitchen.. 1 Injection Every 6 Months 2)  Iron Supplement 325 (65 Fe) Mg  Tabs (Ferrous Sulfate) .Marland Kitchen.. 1 By Mouth Two Times A Day 3)  Simvastatin 5 Mg  Tabs (Simvastatin) .Marland Kitchen.. 1 By Mouth Qd 4)  Pepcid Ac Maximum Strength 20 Mg  Tabs (Famotidine) .Marland Kitchen.. 1 By Mouth Bid 5)  Prednisone 10 Mg Tabs (Prednisone) .... Take 1 and 1/2 Tab Daily (12.5) 6)  Ecotrin Low Strength 81 Mg  Tbec (Aspirin) .Marland Kitchen.. 1 By Mouth M-W-F 7)  Azor 5-20 Mg  Tabs (Amlodipine-Olmesartan) .Marland Kitchen.. 1 By Mouth Once Daily 8)  Furosemide 40 Mg  Tabs (Furosemide) .Marland Kitchen.. 1 By Mouth Once Daily 9)  Multivitamins   Tabs (Multiple Vitamin) .Marland Kitchen.. 1 By Mouth Daily 10)  Calcium/magnesium/zinc Formula 1000-500-50 Mg  Tabs (Calcium-Magnesium-Zinc) .... Take 1 Tab By Mouth At Bedtime 11)  Vitamin D 1000 Unit  Tabs (Cholecalciferol) .... Take 1 Tablet By Mouth Once A Day 12)  Oxycontin 30 Mg Xr12h-Tab (Oxycodone Hcl) .Marland Kitchen.. 1 By Mouth Two Times A Day 13)  Oxycodone Hcl 5 Mg Tabs (Oxycodone Hcl) .Marland Kitchen.. 1 By Mouth Every 6 Hours As Needed 14)  Neurontin 300 Mg Caps (Gabapentin) .Marland Kitchen.. 1 By Mouth Two Times A Day 15)  Methocarbamol 750 Mg Tabs (Methocarbamol) .Marland Kitchen.. 1 Three Times A Day 16)  Senokot S 8.6-50 Mg Tabs (Sennosides-Docusate Sodium) .Marland Kitchen.. 1-2 Tabs By Mouth As Needed For Constipation  Allergies (verified): No Known Drug Allergies

## 2010-10-24 NOTE — Progress Notes (Signed)
Summary: SED RATE ORDER  Phone Note Call from Patient Call back at Home Phone [phone]   Summary of Call: Patient is requesting order for sed rate. Can we do this as a standing order? If yes, how often is ok?  Initial call taken by: Charlsie Quest, [place],  Feb 15, 2010 12:07 PM  Follow-up for Phone Call        OK for Sed Rate 725. Not really a standing order due to tests being done more as needed then scheduled.  Follow-up by: Neena Rhymes MD,  Feb 16, 2010 5:58 PM  Additional Follow-up for Phone Call Additional follow up Details #1::        Pt informed of lab, please put order in idx for sed rate 725, THANKS Additional Follow-up by: Charlsie Quest, CMA,  Feb 17, 2010 1:22 PM    Additional Follow-up for Phone Call Additional follow up Details #2::    order in idx, sed rate. Follow-up by: Denice [place],  Feb 21, 2010 8:40 AM

## 2010-10-24 NOTE — Progress Notes (Signed)
Summary: Schedule Colonoscopy   Phone Note Outgoing Call Call back at Home Phone [phone]   Call placed by: Bernita Buffy CMA Deborra Medina),  April 26, 2010 3:20 PM Call placed to: Patient Summary of Call: patient states that she is having surgery at [facility] and she will call back to schedule her colonoscopy once she has finished up with all of her surgery and follow up from that surgery. I gave her the phone number to call back and advised her of how important it was to keep her colonscopy recalls.  Initial call taken by: Bernita Buffy CMA [facility]),  April 26, 2010 3:22 PM

## 2010-10-24 NOTE — Progress Notes (Signed)
  Phone Note Refill Request Message from:  Fax from Pharmacy on October 10, 2009 8:07 AM  Refills Requested: Medication #1:  BONIVA 150 MG  TABS 1 by mouth q month Initial call taken by: Ami Bullins CMA,  October 10, 2009 8:07 AM    Prescriptions: BONIVA 150 MG  TABS (IBANDRONATE SODIUM) 1 by mouth q month  #1 x 5   Entered by:   Ami Bullins CMA   Authorized by:   Neena Rhymes MD   Signed by:   Charlynne Cousins CMA on 10/10/2009   Method used:   Electronically to        [facility]  [phone]* (retail)       [address]       [place], [place]  [address]       Ph: [phone] or [phone]       Fax: [phone]   RxID:   [id number]

## 2010-10-24 NOTE — Letter (Signed)
Summary: [facility]  [facility]   Imported By: Rise Patience 02/07/2010 16:10:10  _____________________________________________________________________  External Attachment:    Type:   Image     Comment:   External Document

## 2010-10-24 NOTE — Progress Notes (Signed)
Summary: labs/prednisone  Phone Note Call from Patient Call back at [phone]   Call For: [phone] Summary of Call: Patient left message on triage that she had labs done yesterday at [facility]. Per the patient these should be sent to our office so MD can review her sed rate and advise what to do with her Prednisone. Patient is currently at her sisters house # [phone]. Please advise. Initial call taken by: Ernestene Mention,  December 14, 2009 10:05 AM  Follow-up for Phone Call        Patient called again in regards to sed rate. I will look for records again today. Follow-up by: Ernestene Mention,  December 15, 2009 8:44 AM  Additional Follow-up for Phone Call Additional follow up Details #1::        stil no labs in EMR. Did not find any faxed results yesterday pm. Please call patient to let her know labs are pending.  Additional Follow-up by: Neena Rhymes MD,  December 15, 2009 10:30 AM    Additional Follow-up for Phone Call Additional follow up Details #2::    left message on machine to call back to office.Ernestene Mention,  December 15, 2009 3:11 PM  Paper rec'd, sed rate is 36. Please advise. Ernestene Mention  December 15, 2009 3:33 PM   Additional Follow-up for Phone Call Additional follow up Details #3:: Details for Additional Follow-up Action Taken: ok to cont same dose for now and inform pt;  please forward to dr norins to review Mon mar 28 as well/thanks  Pt informed, she will discuss with her neurologist, Dr [name] regarding change in prednisone dose..............Marland KitchenCharlsie Quest, CMA  December 15, 2009 6:12 PM   Additional Follow-up by: Biagio Borg MD,  December 15, 2009 5:29 PM

## 2010-10-24 NOTE — Progress Notes (Signed)
Summary: REQ A CALL/ Elevated BP  Phone Note Call from Patient Call back at [facility] Phone [phone]   Summary of Call: Pt was told to call w/update. She says BP has remained over 140/90 x 1 wk. Today 154/99 then later 166/98. She was told she would need to resume medication. Please advise, patient is also req a call from MD. Would you like to see pt in office this week?  Initial call taken by: Charlsie Quest, [place],  July 03, 2010 11:58 AM  Follow-up for Phone Call        resume Azor 5/20 once daily, please add to med list. She may have samples if we have them.  Follow-up by: Neena Rhymes MD,  July 03, 2010 12:33 PM  Additional Follow-up for Phone Call Additional follow up Details #1::        Pt informed, she statedthat she tripped on a rug and fell backwards. She has back pain and is using ice & heat and rest. Pt advised to f/u in the office w/no improvement in symptoms, she agreed.  Additional Follow-up by: Charlsie Quest, CMA,  July 03, 2010 3:17 PM    New/Updated Medications: AZOR 5-20 MG TABS (AMLODIPINE-OLMESARTAN) 1 once daily for blood pressure Prescriptions: AZOR 5-20 MG TABS (AMLODIPINE-OLMESARTAN) 1 once daily for blood pressure  #90 x 1   Entered by:   Charlsie Quest, CMA   Authorized by:   Neena Rhymes MD   Signed by:   Charlsie Quest, CMA on 07/03/2010   Method used:   Electronically to        [facility]  [phone]* (retail)       [address]       [place], [place]  [address]       Ph: [phone] or [phone]       Fax: [phone]   RxID:   [id number]

## 2010-10-24 NOTE — Progress Notes (Signed)
Summary: RESULTS  Phone Note Call from Patient   Summary of Call: Patient is requesting results of sed rate. Ok to leave mess on hm vm.  Initial call taken by: Charlsie Quest, [place],  October 21, 2009 5:30 PM  Follow-up for Phone Call        ESR 14 mm/hr - normal range. [place] Follow-up by: Neena Rhymes MD,  October 22, 2009 8:43 AM  Additional Follow-up for Phone Call Additional follow up Details #1::        Pt informed  Additional Follow-up by: Charlsie Quest, CMA,  October 24, 2009 8:31 AM

## 2010-10-24 NOTE — Progress Notes (Signed)
Summary: FYI - [place]  Phone Note From Other Clinic   Caller: Home Health  Summary of Call: Home health RN called. He will be doing wound care - normal saline, hydrogel, nonadherent dressing. RN will also continue to monitor x 3 wks.  Initial call taken by: Charlsie Quest, [place],  July 05, 2010 4:29 PM  Follow-up for Phone Call        K Follow-up by: Neena Rhymes MD,  July 06, 2010 1:19 PM

## 2010-10-24 NOTE — Letter (Signed)
Summary: [facility]   Imported By: Bubba Hales 12/12/2009 10:05:53  _____________________________________________________________________  External Attachment:    Type:   Image     Comment:   External Document

## 2010-10-24 NOTE — Miscellaneous (Signed)
Summary: Order/[facility]  Order/[facility]   Imported By: Phillis Knack 07/20/2010 13:22:05  _____________________________________________________________________  External Attachment:    Type:   Image     Comment:   External Document

## 2010-10-24 NOTE — Progress Notes (Signed)
Summary: SED RATE  Phone Note Call from Patient   Summary of Call: Pt left mess requesting results of sed rate. Left vm on pt's hm # that sed rate was 30, she will discuss w/Dr [name].  Initial call taken by: Charlsie Quest, [place],  February 23, 2010 12:07 PM

## 2010-10-24 NOTE — Progress Notes (Signed)
Summary: ALEVE  Phone Note Call from Patient Call back at Home Phone [phone]   Summary of Call: Pt is scheduled for neck surgery on March 4th. She takes aleve two times a day and asa 81mg . She is stopping the asprin but wants to know if she should stop the aleve. Please advise.  Initial call taken by: Charlsie Quest, [place],  November 07, 2009 8:32 AM  Follow-up for Phone Call        Should stop the aleve 2 days before surgery Follow-up by: Neena Rhymes MD,  November 07, 2009 12:50 PM  Additional Follow-up for Phone Call Additional follow up Details #1::        notified. Additional Follow-up by: Ernestene Mention,  November 07, 2009 4:35 PM

## 2010-10-24 NOTE — Progress Notes (Signed)
Summary: Sed Rate  Phone Note Call from Patient Call back at Home Phone [phone]   Caller: Patient Summary of Call: pt called requesting to have labs done to check sed rate while on Prednisone. pt says she has appt with [facility] 10/26/2009 and would like to have this information for MD's review. Initial call taken by: Crissie Sickles, [place],  October 17, 2009 2:59 PM  Follow-up for Phone Call        OK for ESR  temporal arteritis. Follow-up by: Neena Rhymes MD,  October 17, 2009 10:37 PM  Additional Follow-up for Phone Call Additional follow up Details #1::        Left message to call to the office. Order in [place] and that dx is 446.5. Additional Follow-up by: Ernestene Mention,  October 18, 2009 11:23 AM    Additional Follow-up for Phone Call Additional follow up Details #2::    Ok vm per pt, left vm  Follow-up by: Charlsie Quest, CMA,  October 18, 2009 3:41 PM

## 2010-10-24 NOTE — Progress Notes (Signed)
Summary: LAB REQUEST  Phone Note Call from Patient   Caller: [place] vm  Summary of Call: Patient is requesting lab order for sed rate. She needs to go to [facility] hospital. (main phone [phone]) Initial call taken by: Charlsie Quest, [place],  December 12, 2009 8:30 AM  Follow-up for Phone Call        ok for sed rate 725.0 Follow-up by: Neena Rhymes MD,  December 12, 2009 12:55 PM  Additional Follow-up for Phone Call Additional follow up Details #1::        lab order written, pending signature Additional Follow-up by: Charlsie Quest, CMA,  December 12, 2009 5:33 PM    Additional Follow-up for Phone Call Additional follow up Details #2::    Pt informed order to be faxed today Follow-up by: Charlsie Quest, [place],  December 13, 2009 9:08 AM

## 2010-10-24 NOTE — Miscellaneous (Signed)
Summary: Substances Contract  Substances Contract   Imported By: Bubba Hales 06/28/2010 09:22:11  _____________________________________________________________________  External Attachment:    Type:   Image     Comment:   External Document

## 2010-10-24 NOTE — Progress Notes (Signed)
  Phone Note Outgoing Call   Reason for Call: Discuss lab or test results Summary of Call: call patinet - sed rate 34- ok .Copy sent to Dr. [name]. Initial call taken by: Neena Rhymes MD,  August 08, 2010 10:36 AM  Follow-up for Phone Call        Pt informed  Follow-up by: Charlsie Quest, [place],  August 08, 2010 6:38 PM

## 2010-10-24 NOTE — Progress Notes (Signed)
Summary: LAB ORDER  Phone Note Call from Patient   Summary of Call: Patient is requesting sed rate lab order. OK?  Initial call taken by: Charlsie Quest, CMA,  July 31, 2010 11:05 AM  Follow-up for Phone Call        ok  446.5 Follow-up by: Neena Rhymes MD,  July 31, 2010 2:07 PM  Additional Follow-up for Phone Call Additional follow up Details #1::        Pt informed  Additional Follow-up by: Charlsie Quest, CMA,  July 31, 2010 2:15 PM

## 2010-10-26 ENCOUNTER — Encounter: Payer: Self-pay | Admitting: Internal Medicine

## 2010-10-26 NOTE — Assessment & Plan Note (Signed)
Summary: 3 MO ROV /FU AFTER NURSING HOME STAY/NWS #   Vital Signs:  Patient profile:   70 year old female Height:      63 inches Weight:      187 pounds BMI:     33.25 O2 Sat:      96 % on Room air Temp:     98.7 degrees F oral Pulse rate:   75 / minute BP sitting:   110 / 68  (left arm) Cuff size:   regular  Vitals Entered By: Charlynne Cousins CMA (September 28, 2010 1:07 PM)  O2 Flow:  Room air CC: pt here for 3 month follow up. Pt had her 2nd back surg at the end of november/ ab   Primary Care Provider:  norins  CC:  pt here for 3 month follow up. Pt had her 2nd back surg at the end of november/ ab.  History of Present Illness: Patinet presents for follow-up after having her second spinal surgery at [facility]. After the first surgery she still had considerable pain. After this second surgery she feels that she is doing much better. She did spend several weeks in rehab at [facility] after her surgery. Reviewed notes from [facility] and labs from [facility].  Today she has no new complaints. She is due for follow-up lab, specifically ESR related to her temporal arteritis and prednisone dosing. She has a history of steroid induced hyperglycemia for which she has been monitored in the past. Before ordering lab will retrieve labs done at [facility] in the recent past.   She will be seeing Floyde Parkins for follow-up in the near future. Will forward to him her ESR.   Current Medications (verified): 1)  Prolia 60 Mg/ml Soln (Denosumab) .Marland Kitchen.. 1 Injection Every 6 Months 2)  Iron Supplement 325 (65 Fe) Mg  Tabs (Ferrous Sulfate) .Marland Kitchen.. 1 By Mouth Two Times A Day 3)  Simvastatin 5 Mg  Tabs (Simvastatin) .Marland Kitchen.. 1 By Mouth Qd 4)  Pepcid Ac Maximum Strength 20 Mg  Tabs (Famotidine) .Marland Kitchen.. 1 By Mouth Bid 5)  Prednisone 10 Mg Tabs (Prednisone) .... Take 1 Tablet Daily 6)  Ecotrin Low Strength 81 Mg  Tbec (Aspirin) .... Once Daily 7)  Furosemide 40 Mg  Tabs (Furosemide) .Marland Kitchen.. 1 By Mouth  Once Daily 8)  Multivitamins   Tabs (Multiple Vitamin) .Marland Kitchen.. 1 By Mouth Daily 9)  Calcium/magnesium/zinc Formula 1000-500-50 Mg  Tabs (Calcium-Magnesium-Zinc) .... Take 1 Tab By Mouth At Bedtime 10)  Vitamin D 1000 Unit  Tabs (Cholecalciferol) .... Take 1 Tablet By Mouth Once A Day 11)  Oxycontin 30 Mg Xr12h-Tab (Oxycodone Hcl) .Marland Kitchen.. 1 By Mouth Two Times A Day 12)  Oxycodone Hcl 10 Mg Tabs (Oxycodone Hcl) .... Every 4 Hours As Needed Breakthrough 13)  Neurontin 300 Mg Caps (Gabapentin) .Marland Kitchen.. 1 By Mouth Two Times A Day 14)  Methocarbamol 750 Mg Tabs (Methocarbamol) .Marland Kitchen.. 1 Three Times A Day 15)  Senokot S 8.6-50 Mg Tabs (Sennosides-Docusate Sodium) .Marland Kitchen.. 1-2 Tabs By Mouth As Needed For Constipation 16)  Prednisone 2.5 Mg Tabs (Prednisone) .Marland Kitchen.. 1 By Mouth Once Daily 17)  Azor 5-20 Mg Tabs (Amlodipine-Olmesartan) .Marland Kitchen.. 1 Once Daily For Blood Pressure-Hold X 3 Days (07/22/2010) 18)  Santyl 250 Unit/gm Oint (Collagenase) .... Use As Directed On Affected Area  Allergies (verified): No Known Drug Allergies  Past History:  Past Medical History: Last updated: 05/18/2008 Breast cancer, hx of Osteoporosis idiopathic pulmonary fibrosis bronchiectasis low vit d Hyperlipidemia Allergic rhinitis GERD  left knee djd Anxiety migraines Diverticulosis, colon Seizure disorder add.prob Temporal Arteritis Cerebrovascular accident, hx of - right thalamic 2/09 temporal arteritis/right eye blind  on steroids per neurology  Dr [name] Diabetes mellitus, type II - secondary to steroid PMR Anemia-NOS - chronic disease/iron def pulmonary fibrosis - on chronic prednisone/dr wright gait disorder with peripheral neuropathy chronic leukocytosis hx of positive PTTLA/ titer Hypertension Renal insufficiency  Past Surgical History: Mastectomy (L) Cholecystectomy s/p left total knee replacement Tonsillectomy Cataract extraction - OS Summer '10 L2-5 with fusion and mechanical fixation Aug '11 [facility]) T10-L2 interbody fusion No '11 [facility])  Review of Systems  The patient denies anorexia, fever, weight loss, weight gain, chest pain, syncope, dyspnea on exertion, abdominal pain, severe indigestion/heartburn, muscle weakness, difficulty walking, depression, and enlarged lymph nodes.    Physical Exam  General:  overweight white woman in no distress Head:  normocephalic and atraumatic.   Eyes:  vision grossly intact, pupils equal, pupils round, corneas and lenses clear, and no injection.   Neck:  supple.   Lungs:  normal respiratory effort and normal breath sounds.   Heart:  normal rate, regular rhythm, and no JVD.   Abdomen:  soft and normal bowel sounds.   Msk:  no joint swelling, no joint warmth, no redness over joints, and no joint instability.   Pulses:  2+ radial Neurologic:  alert & oriented X3, cranial nerves II-XII intact, and gait normal.   Skin:  turgor normal, color normal, and no suspicious lesions.   Cervical Nodes:  no anterior cervical adenopathy and no posterior cervical adenopathy.   Psych:  Oriented X3, memory intact for recent and remote, normally interactive, and good eye contact.     Impression & Recommendations:  Problem # 1:  LUMBAR RADICULOPATHY, LEFT (ICD-724.4) Doing much better after surgery.  Her updated medication list for this problem includes:    Ecotrin Low Strength 81 Mg Tbec (Aspirin) ..... Once daily    Oxycontin 30 Mg Xr12h-tab (Oxycodone hcl) .Marland Kitchen... 1 by mouth two times a day    Oxycodone Hcl 10 Mg Tabs (Oxycodone hcl) ..... Every 4 hours as needed breakthrough    Methocarbamol 750 Mg Tabs (Methocarbamol) .Marland Kitchen... 1 three times a day  Problem # 2:  ARTERITIS, GIANT CELL (ICD-446.5) For ESR  Orders: TLB-Sedimentation Rate (ESR) (85652-ESR)   Tests: (1) Sed Rate (ESR)   Sed Rate             [H]  43 mm/hr                    0-22  Problem # 3:  RENAL INSUFFICIENCY (ICD-588.9) Recent lab work Dec 5,'11 with creatinine of  1.3 - stable.   Problem # 4:  HYPERTENSION (ICD-401.9) Recent lab with normal K and Na; BUN 19, Creatinine 1.3  Good control on present medications.   Her updated medication list for this problem includes:    Furosemide 40 Mg Tabs (Furosemide) .Marland Kitchen... 1 by mouth once daily    Azor 5-20 Mg Tabs (Amlodipine-olmesartan) .Marland Kitchen... 1 once daily for blood pressure-hold x 3 days (07/22/2010)  BP today: 110/68 Prior BP: 136/70 (06/26/2010)  Problem # 5:  ANEMIA-NOS (ICD-285.9) Recent lab Dec 5, '11 with Hgb 10.4 - stable   Plan - continue iron replacement.  Her updated medication list for this problem includes:    Iron Supplement 325 (65 Fe) Mg Tabs (Ferrous sulfate) .Marland Kitchen... 1 by mouth two times a day  Problem # 6:  DIABETES MELLITUS, TYPE II (ICD-250.00) Patient has a history of previously elevated serum glucose. No record of elevated A1c. Recent lab Dec 5, '11 with serum glucose of 90  Plan- no intervention or medication needed.          careful diet.   Her updated medication list for this problem includes:    Ecotrin Low Strength 81 Mg Tbec (Aspirin) ..... Once daily    Azor 5-20 Mg Tabs (Amlodipine-olmesartan) .Marland Kitchen... 1 once daily for blood pressure-hold x 3 days (07/22/2010)  Problem # 7:  SEIZURE DISORDER (ICD-780.39) Stable with no report of any seizures for greater than 6 months.   Her updated medication list for this problem includes:    Neurontin 300 Mg Caps (Gabapentin) .Marland Kitchen... 1 by mouth two times a day  Problem # 8:  HYPERLIPIDEMIA (ICD-272.4) Recent lab Dec 5, '11 wiht total cholesterol of 145, HDL 28, LDL 84, Tgy 167  Plan - continue present dose of simvastatin.   Her updated medication list for this problem includes:    Simvastatin 5 Mg Tabs (Simvastatin) .Marland Kitchen... 1 by mouth qd  Complete Medication List: 1)  Prolia 60 Mg/ml Soln (Denosumab) .Marland Kitchen.. 1 injection every 6 months 2)  Iron Supplement 325 (65 Fe) Mg Tabs (Ferrous sulfate) .Marland Kitchen.. 1 by mouth two times a day 3)   Simvastatin 5 Mg Tabs (Simvastatin) .Marland Kitchen.. 1 by mouth qd 4)  Pepcid Ac Maximum Strength 20 Mg Tabs (Famotidine) .Marland Kitchen.. 1 by mouth bid 5)  Prednisone 10 Mg Tabs (Prednisone) .... Take 1 tablet daily 6)  Ecotrin Low Strength 81 Mg Tbec (Aspirin) .... Once daily 7)  Furosemide 40 Mg Tabs (Furosemide) .Marland Kitchen.. 1 by mouth once daily 8)  Multivitamins Tabs (Multiple vitamin) .Marland Kitchen.. 1 by mouth daily 9)  Calcium/magnesium/zinc Formula 1000-500-50 Mg Tabs (Calcium-magnesium-zinc) .... Take 1 tab by mouth at bedtime 10)  Vitamin D 1000 Unit Tabs (Cholecalciferol) .... Take 1 tablet by mouth once a day 11)  Oxycontin 30 Mg Xr12h-tab (Oxycodone hcl) .Marland Kitchen.. 1 by mouth two times a day 12)  Oxycodone Hcl 10 Mg Tabs (Oxycodone hcl) .... Every 4 hours as needed breakthrough 13)  Neurontin 300 Mg Caps (Gabapentin) .Marland Kitchen.. 1 by mouth two times a day 14)  Methocarbamol 750 Mg Tabs (Methocarbamol) .Marland Kitchen.. 1 three times a day 15)  Senokot S 8.6-50 Mg Tabs (Sennosides-docusate sodium) .Marland Kitchen.. 1-2 tabs by mouth as needed for constipation 16)  Prednisone 2.5 Mg Tabs (Prednisone) .Marland Kitchen.. 1 by mouth once daily 17)  Azor 5-20 Mg Tabs (Amlodipine-olmesartan) .Marland Kitchen.. 1 once daily for blood pressure-hold x 3 days (07/22/2010) 18)  Santyl 250 Unit/gm Oint (Collagenase) .... Use as directed on affected area   Orders Added: 1)  TLB-Sedimentation Rate (ESR) [85652-ESR] 2)  Est. Patient Level IV [id number]

## 2010-10-26 NOTE — Progress Notes (Signed)
Summary: 2 Azor  Phone Note Call from Patient   Summary of Call: Pt took 2 azor by accident. Advised pt to continued to monitor her BP and this should not be a problem. Call office immediately w/any concerns. Pt agreed.  Initial call taken by: Charlsie Quest, [place],  October 09, 2010 12:43 PM  Follow-up for Phone Call        agree with recommendations. Should not be a problem Follow-up by: Neena Rhymes MD,  October 09, 2010 1:14 PM

## 2010-10-26 NOTE — Progress Notes (Signed)
Summary: REFERRAL  Phone Note From Other Clinic   Caller: note from Dr. [name] Summary of Call: Patient with Rheum panel revealing elevated RF at 195.8. Will have patient contacted and set up referral to rheumatology.  Please call patient - elevated rheumatoid factor which may indicate rheumatoid arthritis. Will refer to a rheumatologist. Ask for preference, if none [facility] will make referral.  Initial call taken by: Neena Rhymes MD,  October 07, 2010 10:55 AM  Follow-up for Phone Call        Pt informed, Pt has no preference, please put in new referral.  Follow-up by: Charlsie Quest, [place],  October 09, 2010 10:37 AM

## 2010-10-26 NOTE — Letter (Signed)
Summary: [facility]  [facility]   Imported By: Phillis Knack 10/10/2010 10:03:01  _____________________________________________________________________  External Attachment:    Type:   Image     Comment:   External Document

## 2010-10-26 NOTE — Assessment & Plan Note (Signed)
Summary: RIB PAIN/ HAS HURT FOR A WEEK/NWS   Vital Signs:  Patient profile:   70 year old female Height:      63 inches Weight:      187 pounds BMI:     33.25 O2 Sat:      98 % on Room air Temp:     98.2 degrees F oral Pulse rate:   84 / minute Pulse rhythm:   regular BP sitting:   116 / 70  (right arm) Cuff size:   regular  Vitals Entered By: [place] (October 03, 2010 4:45 PM)  O2 Flow:  Room air CC: Patient c/o constant rib pain that radiates to her side   Primary Care Provider:  norins  CC:  Patient c/o constant rib pain that radiates to her side.  History of Present Illness: Patient returns for pain in the right chest that starts mid-scapular line and around to the mid-clavicular line under the left breast. She rates the pain as moderate to severe, 8/10. She has had no injury, no hard coughng. she does admit to some straining when she pulls herself up in the bed or gets out of bed. She has not seen any rash. Deep inspirations are painful. She has not had any fever, chills or productive sputum.  Allergies: No Known Drug Allergies PMH-FH-SH reviewed-no changes except otherwise noted  Review of Systems  The patient denies fever, weight loss, syncope, dyspnea on exertion, prolonged cough, abdominal pain, muscle weakness, difficulty walking, and enlarged lymph nodes.    Physical Exam  General:  overweight white woman in no acute distress. Head:  normocephalic and atraumatic.   Eyes:  pupils equal and pupils round.   Chest Wall:  very tender to palpation of the intercostal muscles right chest wall. Lungs:  normal respiratory effort and normal breath sounds.   Heart:  normal rate and regular rhythm.   Skin:  no rash on the right chest wall   Impression & Recommendations:  Problem # 1:  CHEST WALL PAIN, ACUTE [id number]) Patient with acute chest wall pain. No evidence of zoster. No evidence of rib fracture or pulmonary process. Presntation is c/w muscle  strain intercostals.  Plan - she is to continue on prednisone 87m g daily           application of heat and/or capascain cream           instructed to take 5 deep breaths every hour while awake to avoid atelectasis and increased risk of pneumonia  Her updated medication list for this problem includes:    Ecotrin Low Strength 81 Mg Tbec (Aspirin) ..... Once daily  Complete Medication List: 1)  Prolia 60 Mg/ml Soln (Denosumab) .Marland Kitchen.. 1 injection every 6 months 2)  Iron Supplement 325 (65 Fe) Mg Tabs (Ferrous sulfate) .Marland Kitchen.. 1 by mouth two times a day 3)  Simvastatin 5 Mg Tabs (Simvastatin) .Marland Kitchen.. 1 by mouth qd 4)  Pepcid Ac Maximum Strength 20 Mg Tabs (Famotidine) .Marland Kitchen.. 1 by mouth bid 5)  Prednisone 10 Mg Tabs (Prednisone) .... Take 1 tablet daily 6)  Ecotrin Low Strength 81 Mg Tbec (Aspirin) .... Once daily 7)  Furosemide 40 Mg Tabs (Furosemide) .Marland Kitchen.. 1 by mouth once daily 8)  Multivitamins Tabs (Multiple vitamin) .Marland Kitchen.. 1 by mouth daily 9)  Calcium/magnesium/zinc Formula 1000-500-50 Mg Tabs (Calcium-magnesium-zinc) .... Take 1 tab by mouth at bedtime 10)  Vitamin D 1000 Unit Tabs (Cholecalciferol) .... Take 1 tablet by mouth once a day 11)  Oxycontin 30 Mg Xr12h-tab (Oxycodone hcl) .Marland Kitchen.. 1 by mouth two times a day 12)  Oxycodone Hcl 10 Mg Tabs (Oxycodone hcl) .... Every 4 hours as needed breakthrough 13)  Neurontin 300 Mg Caps (Gabapentin) .Marland Kitchen.. 1 by mouth two times a day 14)  Methocarbamol 750 Mg Tabs (Methocarbamol) .Marland Kitchen.. 1 three times a day 15)  Senokot S 8.6-50 Mg Tabs (Sennosides-docusate sodium) .Marland Kitchen.. 1-2 tabs by mouth as needed for constipation 16)  Prednisone 2.5 Mg Tabs (Prednisone) .Marland Kitchen.. 1 by mouth once daily 17)  Azor 5-20 Mg Tabs (Amlodipine-olmesartan) .Marland Kitchen.. 1 once daily for blood pressure-hold x 3 days (07/22/2010) 18)  Santyl 250 Unit/gm Oint (Collagenase) .... Use as directed on affected area   Orders Added: 1)  Est. Patient Level III [id number]

## 2010-10-26 NOTE — Letter (Signed)
Summary: Neurosurgery/[facility]  Neurosurgery/[facility]   Imported By: Bubba Hales 09/11/2010 09:59:17  _____________________________________________________________________  External Attachment:    Type:   Image     Comment:   External Document

## 2010-10-26 NOTE — Progress Notes (Signed)
Summary: [facility]   Phone Note From Other Clinic   Caller: [phone] Therapist Summary of Call: [place] req verbal ok to continue therapy.  Initial call taken by: Charlsie Quest, [place],  October 12, 2010 3:18 PM  Follow-up for Phone Call        ok to continue therapy Follow-up by: Neena Rhymes MD,  October 12, 2010 5:39 PM  Additional Follow-up for Phone Call Additional follow up Details #1::        therapist informed.  Additional Follow-up by: Charlsie Quest, [place],  October 13, 2010 10:10 AM

## 2010-10-27 NOTE — Letter (Signed)
Summary: [facility]surgery/[facility]  Neurosurgery/[facility]   Imported By: Phillis Knack 03/22/2010 11:30:25  _____________________________________________________________________  External Attachment:    Type:   Image     Comment:   External Document

## 2010-10-30 ENCOUNTER — Telehealth: Payer: Self-pay | Admitting: Internal Medicine

## 2010-10-30 ENCOUNTER — Ambulatory Visit: Payer: Self-pay | Admitting: Internal Medicine

## 2010-11-01 ENCOUNTER — Encounter: Payer: Self-pay | Admitting: Internal Medicine

## 2010-11-01 DIAGNOSIS — Z4789 Encounter for other orthopedic aftercare: Secondary | ICD-10-CM

## 2010-11-01 DIAGNOSIS — M353 Polymyalgia rheumatica: Secondary | ICD-10-CM

## 2010-11-01 DIAGNOSIS — IMO0002 Reserved for concepts with insufficient information to code with codable children: Secondary | ICD-10-CM

## 2010-11-01 NOTE — Consult Note (Signed)
Summary: [facility]   Imported By: Phillis Knack 10/25/2010 10:11:45  _____________________________________________________________________  External Attachment:    Type:   Image     Comment:   External Document

## 2010-11-04 ENCOUNTER — Encounter: Payer: Self-pay | Admitting: Internal Medicine

## 2010-11-08 ENCOUNTER — Ambulatory Visit (INDEPENDENT_AMBULATORY_CARE_PROVIDER_SITE_OTHER): Payer: Medicare Other | Admitting: Internal Medicine

## 2010-11-08 ENCOUNTER — Encounter: Payer: Self-pay | Admitting: Internal Medicine

## 2010-11-08 DIAGNOSIS — M549 Dorsalgia, unspecified: Secondary | ICD-10-CM

## 2010-11-08 DIAGNOSIS — S81009A Unspecified open wound, unspecified knee, initial encounter: Secondary | ICD-10-CM

## 2010-11-08 DIAGNOSIS — S81809A Unspecified open wound, unspecified lower leg, initial encounter: Secondary | ICD-10-CM

## 2010-11-09 NOTE — Letter (Signed)
Summary: Neurosurgery/[facility]  Neurosurgery/[facility]   Imported By: Phillis Knack 11/03/2010 07:38:23  _____________________________________________________________________  External Attachment:    Type:   Image     Comment:   External Document

## 2010-11-09 NOTE — Progress Notes (Signed)
Summary: Therapy on Hold _ FYI  Phone Note From Other Clinic   Caller: [name] - Physical Therapy Summary of Call: Therapist called - Pt had a fall thursday, was seen in the ER and had sutures in her knee. Therapy is on hold b/c pt was instructed to not move her knee x 3 wks.  Initial call taken by: Charlsie Quest, [place],  October 30, 2010 2:28 PM  Follow-up for Phone Call        noted. Follow-up by: Neena Rhymes MD,  October 30, 2010 6:10 PM

## 2010-11-09 NOTE — Miscellaneous (Signed)
Summary: Plan/[facility]  Plan/[facility]   Imported By: Bubba Hales 11/03/2010 15:32:47  _____________________________________________________________________  External Attachment:    Type:   Image     Comment:   External Document

## 2010-11-15 NOTE — Assessment & Plan Note (Signed)
Summary: back pain/cd   Vital Signs:  Patient profile:   70 year old female Height:      63 inches Weight:      194.50 pounds O2 Sat:      95 % on Room air Temp:     98.3 degrees F oral Pulse rate:   93 / minute Resp:     18 per minute BP sitting:   110 / 70  (right arm) Cuff size:   regular  Vitals Entered By: Marybelle Killings, RN (November 08, 2010 11:03 AM)  O2 Flow:  Room air CC: c/o Right sided back pain just below shoulder blade. onset of pain after fall and injury to left knee(was Dc'd from hospital Monday 2/13. jfe   Primary Care Provider:  Jasiri Hanawalt  CC:  c/o Right sided back pain just below shoulder blade. onset of pain after fall and injury to left knee(was Dc'd from hospital Monday 2/13. jfe.  History of Present Illness: Patient is well known to me. she just recently had redo,second surgery on her back and was doing well. Two weeks ago, at her sisters, she fell and sustained a large L shaped laceration to the left knee requiring major 2 layer suture repair at [facility] ED in [place]. Due to a cellulitis she did come to admission for 5 days where a PICC line was placed and she was started on IV vancomycin for the cellulitis and to prevent a prosthetic knee joint infection. She is continuing to receive daily Vancomycin as an outpatient. She has had no recurrent fevers. There is scant bloody drainage from the wounded knee. She has been wearing a knee immobilizer.  Her chief complaint is pain in the area of the right scapula. It is not severe and does not limit the ROM of the shoulder or scapula. She is not splinting and has no pain with deep inspiration.  Current Medications (verified): 1)  Prolia 60 Mg/ml Soln (Denosumab) .Marland Kitchen.. 1 Injection Every 6 Months 2)  Iron Supplement 325 (65 Fe) Mg  Tabs (Ferrous Sulfate) .Marland Kitchen.. 1 By Mouth Two Times A Day 3)  Simvastatin 5 Mg  Tabs (Simvastatin) .Marland Kitchen.. 1 By Mouth Qd 4)  Pepcid Ac Maximum Strength 20 Mg  Tabs (Famotidine) .Marland Kitchen.. 1 By Mouth  Bid 5)  Prednisone 10 Mg Tabs (Prednisone) .... Take 1 Tablet Daily 6)  Ecotrin Low Strength 81 Mg  Tbec (Aspirin) .... Once Daily 7)  Furosemide 40 Mg  Tabs (Furosemide) .Marland Kitchen.. 1 By Mouth Once Daily 8)  Multivitamins   Tabs (Multiple Vitamin) .Marland Kitchen.. 1 By Mouth Daily 9)  Calcium/magnesium/zinc Formula 1000-500-50 Mg  Tabs (Calcium-Magnesium-Zinc) .... Take 1 Tab By Mouth At Bedtime 10)  Vitamin D 1000 Unit  Tabs (Cholecalciferol) .... Take 1 Tablet By Mouth Once A Day 11)  Oxycontin 30 Mg Xr12h-Tab (Oxycodone Hcl) .Marland Kitchen.. 1 By Mouth Two Times A Day 12)  Oxycodone Hcl 10 Mg Tabs (Oxycodone Hcl) .... Every 4 Hours As Needed Breakthrough 13)  Neurontin 300 Mg Caps (Gabapentin) .Marland Kitchen.. 1 By Mouth Two Times A Day 14)  Methocarbamol 750 Mg Tabs (Methocarbamol) .Marland Kitchen.. 1 Three Times A Day 15)  Senokot S 8.6-50 Mg Tabs (Sennosides-Docusate Sodium) .Marland Kitchen.. 1-2 Tabs By Mouth As Needed For Constipation 16)  Prednisone 2.5 Mg Tabs (Prednisone) .Marland Kitchen.. 1 By Mouth Once Daily 17)  Azor 5-20 Mg Tabs (Amlodipine-Olmesartan) .Marland Kitchen.. 1 Once Daily For Blood Pressure  Allergies (verified): No Known Drug Allergies  Past History:  Past Medical History: Last updated: 05/18/2008  Breast cancer, hx of Osteoporosis idiopathic pulmonary fibrosis bronchiectasis low vit d Hyperlipidemia Allergic rhinitis GERD left knee djd Anxiety migraines Diverticulosis, colon Seizure disorder add.prob Temporal Arteritis Cerebrovascular accident, hx of - right thalamic 2/09 temporal arteritis/right eye blind  on steroids per neurology  Dr [name] Diabetes mellitus, type II - secondary to steroid PMR Anemia-NOS - chronic disease/iron def pulmonary fibrosis - on chronic prednisone/dr wright gait disorder with peripheral neuropathy chronic leukocytosis hx of positive PTTLA/ titer Hypertension Renal insufficiency  Past Surgical History: Last updated: 09/28/2010 Mastectomy (L) Cholecystectomy s/p left total knee  replacement Tonsillectomy Cataract extraction - OS Summer '10 L2-5 with fusion and mechanical fixation Aug '11 [facility]) T10-L2 interbody fusion No '11 [facility])  Family History: Last updated: 10/08/2007 fathe with dementia, HTN sister with breast cancer mother with brain tumor, HTN  Social History: Last updated: 01/12/2009 Never Smoked Alcohol use-no widowed 8/09 1 child retired - Solicitor and Phot[employer]  Review of Systems       The patient complains of difficulty walking.  The patient denies anorexia, fever, weight loss, decreased hearing, chest pain, syncope, dyspnea on exertion, abdominal pain, muscle weakness, suspicious skin lesions, depression, enlarged lymph nodes, and angioedema.    Physical Exam  General:  WNWD overweight white woman in no distress Head:  normocephalic and atraumatic.   Eyes:  C&S clear Neck:  supple.   Lungs:  normal breath sounds.   Heart:  normal rate and regular rhythm.   Msk:  Left knee with preserved ROM although full flexion and extension was not attempted. No effusion about the left knee.  Right arm with full ROM. Scapula moves freely. No palpable chest was abnormality.No significant pain with AP or lateral compression of the chest wall.  Neurologic:  alert & oriented X3 and cranial nerves II-XII intact.   Skin:  Dressing removed from wound left knee. Sutures, 13 days in place, look fine with no tearing, no inflammation. there is some rubor to the left knee but it is not hot to touch. Redressed the wound  Psych:  Oriented X3 and memory intact for recent and remote.     Impression & Recommendations:  Problem # 1:  WOUND, LEG (ICD-891.0)  Patient with a healing laceration left knee. On exam it looks intact and there is no obvious infection. Doubt deep seated infection.  Plan - continue IV vanc as directed           daily dressing change           begin gentle range of motion for the left knee            keep appointment with [facility] for Feb 21st  Orders: [place] Wrap,  <3 in/yd [id number])  Problem # 2:  BACK PAIN (ICD-724.5) Mild chest wall tenderness upper right thoracic region. Suspect strain from fall.  Plan - heat - may use Therma Patch.  Her updated medication list for this problem includes:    Ecotrin Low Strength 81 Mg Tbec (Aspirin) ..... Once daily    Oxycontin 30 Mg Xr12h-tab (Oxycodone hcl) .Marland Kitchen... 1 by mouth two times a day    Oxycodone Hcl 10 Mg Tabs (Oxycodone hcl) ..... Every 4 hours as needed breakthrough    Methocarbamol 750 Mg Tabs (Methocarbamol) .Marland Kitchen... 1 three times a day  Complete Medication List: 1)  Prolia 60 Mg/ml Soln (Denosumab) .Marland Kitchen.. 1 injection every 6 months 2)  Iron Supplement 325 (65 Fe) Mg Tabs (Ferrous sulfate) .Marland KitchenMarland KitchenMarland Kitchen  1 by mouth two times a day 3)  Simvastatin 5 Mg Tabs (Simvastatin) .Marland Kitchen.. 1 by mouth qd 4)  Pepcid Ac Maximum Strength 20 Mg Tabs (Famotidine) .Marland Kitchen.. 1 by mouth bid 5)  Prednisone 10 Mg Tabs (Prednisone) .... Take 1 tablet daily 6)  Ecotrin Low Strength 81 Mg Tbec (Aspirin) .... Once daily 7)  Furosemide 40 Mg Tabs (Furosemide) .Marland Kitchen.. 1 by mouth once daily 8)  Multivitamins Tabs (Multiple vitamin) .Marland Kitchen.. 1 by mouth daily 9)  Calcium/magnesium/zinc Formula 1000-500-50 Mg Tabs (Calcium-magnesium-zinc) .... Take 1 tab by mouth at bedtime 10)  Vitamin D 1000 Unit Tabs (Cholecalciferol) .... Take 1 tablet by mouth once a day 11)  Oxycontin 30 Mg Xr12h-tab (Oxycodone hcl) .Marland Kitchen.. 1 by mouth two times a day 12)  Oxycodone Hcl 10 Mg Tabs (Oxycodone hcl) .... Every 4 hours as needed breakthrough 13)  Neurontin 300 Mg Caps (Gabapentin) .Marland Kitchen.. 1 by mouth two times a day 14)  Methocarbamol 750 Mg Tabs (Methocarbamol) .Marland Kitchen.. 1 three times a day 15)  Senokot S 8.6-50 Mg Tabs (Sennosides-docusate sodium) .Marland Kitchen.. 1-2 tabs by mouth as needed for constipation 16)  Prednisone 2.5 Mg Tabs (Prednisone) .Marland Kitchen.. 1 by mouth once daily 17)  Azor 5-20 Mg Tabs (Amlodipine-olmesartan)  .Marland Kitchen.. 1 once daily for blood pressure   Orders Added: 1)  Coban Wrap,  <3 in/yd [A6453] 2)  Est. Patient Level III [id number]

## 2010-11-15 NOTE — Miscellaneous (Signed)
Summary: Addend[facility]  Addendum/[facility]   Imported By: Bubba Hales 11/08/2010 10:54:34  _____________________________________________________________________  External Attachment:    Type:   Image     Comment:   External Document

## 2010-12-14 ENCOUNTER — Telehealth: Payer: Self-pay | Admitting: *Deleted

## 2010-12-14 DIAGNOSIS — M316 Other giant cell arteritis: Secondary | ICD-10-CM

## 2010-12-14 NOTE — Telephone Encounter (Signed)
[place] for sed rate - dx. Temporal arteritis

## 2010-12-14 NOTE — Telephone Encounter (Signed)
Patient requesting lab order for sed rate. OK?

## 2010-12-14 NOTE — Telephone Encounter (Signed)
Patient informed, lab order placed.  

## 2010-12-15 ENCOUNTER — Other Ambulatory Visit (INDEPENDENT_AMBULATORY_CARE_PROVIDER_SITE_OTHER): Payer: Medicare Other

## 2010-12-15 DIAGNOSIS — M316 Other giant cell arteritis: Secondary | ICD-10-CM

## 2010-12-15 LAB — SEDIMENTATION RATE: Sed Rate: 34 mm/hr — ABNORMAL HIGH (ref 0–22)

## 2010-12-18 ENCOUNTER — Telehealth: Payer: Self-pay | Admitting: *Deleted

## 2010-12-18 NOTE — Telephone Encounter (Signed)
Patient informed. 

## 2010-12-18 NOTE — Telephone Encounter (Signed)
RESULTS were sent to me in error, please advise.

## 2010-12-18 NOTE — Telephone Encounter (Signed)
ESr 34 - which is ok.

## 2011-01-03 LAB — CBC
HCT: 37.8 % (ref 36.0–46.0)
MCHC: 34 g/dL (ref 30.0–36.0)
MCV: 93.7 fL (ref 78.0–100.0)
Platelets: 249 10*3/uL (ref 150–400)
RDW: 14.6 % (ref 11.5–15.5)

## 2011-01-03 LAB — COMPREHENSIVE METABOLIC PANEL
Albumin: 3 g/dL — ABNORMAL LOW (ref 3.5–5.2)
BUN: 13 mg/dL (ref 6–23)
Calcium: 8.4 mg/dL (ref 8.4–10.5)
Creatinine, Ser: 1.28 mg/dL — ABNORMAL HIGH (ref 0.4–1.2)
GFR calc Af Amer: 50 mL/min — ABNORMAL LOW (ref >60)
Total Bilirubin: 1.2 mg/dL (ref 0.3–1.2)
Total Protein: 5.5 g/dL — ABNORMAL LOW (ref 6.0–8.3)

## 2011-01-03 LAB — POCT CARDIAC MARKERS
CKMB, poc: 1.4 ng/mL (ref 1.0–8.0)
Troponin i, poc: <0.05 ng/mL (ref 0.00–0.09)

## 2011-01-03 LAB — DIFFERENTIAL
Basophils Absolute: 0 10*3/uL (ref 0.0–0.1)
Lymphocytes Relative: 13 % (ref 12–46)
Lymphs Abs: 1.2 10*3/uL (ref 0.7–4.0)
Monocytes Absolute: 1 10*3/uL (ref 0.1–1.0)
Monocytes Relative: 10 % (ref 3–12)
Neutro Abs: 7.1 10*3/uL (ref 1.7–7.7)

## 2011-01-03 LAB — SEDIMENTATION RATE: Sed Rate: 60 mm/hr — ABNORMAL HIGH (ref 0–22)

## 2011-01-03 LAB — GLUCOSE, CAPILLARY
Glucose-Capillary: 146 mg/dL — ABNORMAL HIGH (ref 70–99)
Glucose-Capillary: 76 mg/dL (ref 70–99)

## 2011-01-03 LAB — URINE MICROSCOPIC-ADD ON

## 2011-01-03 LAB — URINALYSIS, ROUTINE W REFLEX MICROSCOPIC
Bilirubin Urine: NEGATIVE
Hgb urine dipstick: NEGATIVE
Ketones, ur: NEGATIVE mg/dL
Specific Gravity, Urine: 1.012 (ref 1.005–1.030)
Urobilinogen, UA: 0.2 mg/dL (ref 0.0–1.0)

## 2011-02-01 ENCOUNTER — Telehealth: Payer: Self-pay | Admitting: *Deleted

## 2011-02-01 NOTE — Telephone Encounter (Signed)
Needs date of last Sed Rate, left vm for pt per her req w/the info

## 2011-02-05 ENCOUNTER — Telehealth: Payer: Self-pay

## 2011-02-05 NOTE — Telephone Encounter (Signed)
Will do labs tomorrow

## 2011-02-05 NOTE — Telephone Encounter (Signed)
Pt called stating she is due for Sed Rate check. Pt says she has appt with Dr Linda Hedges tomorrow and would like labs scheduled same day.

## 2011-02-06 ENCOUNTER — Ambulatory Visit (INDEPENDENT_AMBULATORY_CARE_PROVIDER_SITE_OTHER): Payer: Medicare Other | Admitting: Internal Medicine

## 2011-02-06 ENCOUNTER — Other Ambulatory Visit (INDEPENDENT_AMBULATORY_CARE_PROVIDER_SITE_OTHER): Payer: Medicare Other

## 2011-02-06 VITALS — BP 114/60 | HR 81 | Temp 98.2°F | Wt 181.0 lb

## 2011-02-06 DIAGNOSIS — M316 Other giant cell arteritis: Secondary | ICD-10-CM

## 2011-02-06 DIAGNOSIS — N189 Chronic kidney disease, unspecified: Secondary | ICD-10-CM | POA: Insufficient documentation

## 2011-02-06 MED ORDER — TRIAMCINOLONE ACETONIDE 0.1 % EX CREA: TOPICAL_CREAM | Freq: Two times a day (BID) | CUTANEOUS | Status: DC

## 2011-02-06 NOTE — Assessment & Plan Note (Signed)
[facility]   NAME:  HOOR, METZNER NO.:  [id number]   MEDICAL RECORD NO.:  [id number]      DATE OF BIRTH:  May 17, 1941   PHYSICIAN:  Ricard Dillon, M.D. VISIT DATE:  09/09/2008                                   OFFICE VISIT   HISTORY:  Mrs. Monastero is a lady we have been following for a stasis  ulceration in her left lateral leg.  This is in the setting of steroid  dependent temporal arteritis.  This has been slow to heal and currently  is on 25 mg of prednisone a day.  She is currently on Prisma, SofSorb,  and a Profore.  She does not describe any undue pain or other systemic  drainage or other systemic symptoms.   PHYSICAL EXAMINATION:  The wound itself measures 1.1 x 0.7 x 0.2.  There  is healthy-looking granulation, still some significant periwound edema.  However, I did not think this needed debridement and nothing seemed  infectious.  She had good peripheral pulses.   IMPRESSION:  Stasis ulceration as described above.  I think this has  made some progress in at least its appearance.  We continued with the  Prisma, SofSorb, and Profore.  I will attempt to keep her out for 10  days until after the holiday period.  I did give her permission to  remove this if there was difficulty and gave her instructions on how to  Ace wrap this in the interim until we could see her.           ______________________________  Ricard Dillon, M.D.     MGR/MEDQ  D:  09/09/2008  T:  09/09/2008  Job:  [id number]

## 2011-02-06 NOTE — Consult Note (Signed)
[name], [name]              ACCOUNT NO.:  [id number]   MEDICAL RECORD NO.:  [id number]          PATIENT TYPE:  INP   LOCATION:  5506                         FACILITY:  [facility]   PHYSICIAN:  Pramod P. Leonie Man, MD    DATE OF BIRTH:  [birth year]   DATE OF CONSULTATION:  11/20/2007  DATE OF DISCHARGE:                                 CONSULTATION   REASON FOR REFERRAL:  Confusion, rule out stroke.   HISTORY OF PRESENT ILLNESS:  [name] is a 70 year old lady who was  found this morning at around 8 a.m. by her stepson to be confused and  lying on the floor with her head beneath her bed.  She was incontinent  of urine and there was some blood around her mouth.  The patient's  speech was garbled and she could not be easily understood.  Since then,  speech can be understood, but she is slow, hesitant to speak and has  remained disoriented.  There was no focal extremity weakness or numbness  noted.  The patient did not have any vertigo or double vision.  The  patient was recently discharged from the hospital yesterday after being  admitted last week with some numbness in her hands.  She had some  elevated ESR of 96 and diagnosis of temporal arteritis was suspected.  She was started on high-dose prednisone and underwent temporal artery  biopsy, the results of which were inconclusive and showed some mild  lymphocytic infiltrates.  The patient had previously been on long-term  steroids for polymyalgia rheumatica.  The patient was also found to have  steroid-induced diabetes and was discharged on metformin.  The stepson  who I spoke to over the phone said that last night, the patient  complained of some headache.  He thought her sugar may have been low, so  he gave her some orange juice.  She felt better and fell asleep.  At 7  a.m. when he woke up, she was sleeping peacefully.  He went to the other  room and when he came back at 8 to wake her up, he found her on the  floor with her head  beneath the bed and speech not being comprehensible.   PAST MEDICAL HISTORY:  1. Significant for polymyalgia rheumatica.  2. Hyperlipidemia.   HOME MEDICATIONS:  1. Metformin.  2. Prednisone.   ALLERGIES TO MEDICATIONS:  NONE.   SOCIAL HISTORY:  The patient lives with her husband and stepson in  [place].  She does not smoke or drink.  She is independent in  activities of daily living.   REVIEW OF SYSTEMS:  As documented above.   FAMILY HISTORY:  Noncontributory.   PHYSICAL EXAMINATION:  GENERAL:  Physical exam reveals a frail, elderly  Caucasian lady who is at present not in distress.  VITAL SIGNS:  She is afebrile, temperature 96.7, pulse 84 per minute,  regular sinus, respiratory rate 22 per minute, sats 100% on room air,  blood pressure 187/99.  EXTREMITIES:  There is 2+ pedal edema in the lower extremities, but  distal pulses are felt.  HEENT:  Head  is nontraumatic.  Face shows surgical scar from temporal  arteritis  biopsy.  NECK:  Supple.  There is no bruit.  CARDIAC:  Regular heart sounds.  No murmur.  LUNGS:  Clear to auscultation.  NEUROLOGIC:  The patient is awake, alert.  She is oriented only to  place, but not to person, not time.  She follows simple one-step  commands.  There is diminished attention registration and recall.  Speech is nonfluent, but she has no slurred speech and can speak  sentences.  She can name and repeat.  Eye movements are full range.  There is no nystagmus.  Her visual acuity is limited bilaterally, but I  doubt her cooperation.  She can count fingers at only 2 feet.  Face is  symmetric.  Tongue is midline.  MOTOR:  Reveals no upper extremity drift, symmetric strength, tone,  reflexes in all 4 extremities.  Sensation is intact.  There is no  extinction.  Plantars are downgoing.  Gait was not tested.   LABORATORY DATA:  Data reviewed.   DIAGNOSTICS:  1. CT scan of the head shows a questionable area of hyperdensity in      the  left  thalamus/corona radiata which is new compared to the previous CT scan  from a week ago.  2.  MRI scan of the brain from a week ago shows a very  tiny right lateral thalamic infarct which is not visible on today's CT  scan.   IMPRESSION:  A 70 year old lady with sudden onset of confusion,  disorientation, possibly an unwitnessed seizure.  Posterior circulation  stroke is a possibility, but less likely. Hypoglycemia given her recent  metformin being started is less likely.   PLAN:  The patient is to be admitted for further evaluation and  treatment.  I would recommend checking an EEG as well as obtaining a  limited MRI scan to look for new stroke.  Continue prednisone at the  present dose 60 mg a day.  Check blood sugars and avoid hypoglycemia.  I  had long discussion with the patient, her son and stepson, and discussed  above plan for evaluation, treatment and answered questions.           ______________________________  Kathie Rhodes. Leonie Man, MD     PPS/MEDQ  D:  11/20/2007  T:  11/21/2007  Job:  [id number]

## 2011-02-06 NOTE — Consult Note (Signed)
[name], [name] NO.:  [id number]   MEDICAL RECORD NO.:  [id number]          PATIENT TYPE:  INP   LOCATION:  [place]                         FACILITY:  [facility]   PHYSICIAN:  Jill Alexanders, M.D.  DATE OF BIRTH:  02/19/1941   DATE OF CONSULTATION:  11/15/2007  DATE OF DISCHARGE:                                 CONSULTATION   HISTORY OF PRESENT ILLNESS:  [name] is a 70 year old right-  handed white female born Feb 07, 1941, with a history of malaise and  some fevers, chills, night sweats that began in January 2009.  Patient  has had a workup with Dr. Ralene Ok to find the cause of this, but no  obvious source of the of the problem has been noted.  The patient  developed anemia associated with this and has had some weight loss.  The  patient was admitted to [facility] on November 02, 2007, with a  thalamic stroke event.  The patient was noted to have a right thalamic  stroke event.  The patient did undergo a 2-D cardiogram showing ejection  fraction 60-65%. Bilateral carotid Doppler was done with no evidence of  internal carotid artery stenosis noted.  MRI scan of the brain showed an  acute right thalamic stroke.  A MRI angiogram of intracranial vessels  was not done.  The patient was placed on aspirin and pravastatin. Has  been on low-dose prednisone 5 mg daily for pulmonary fibrosis.  The  patient had returned home and felt like she was improving a bit with her  stamina.  The patient has noted headaches in the bitemporal regions  frontal area for 2-1/2 weeks.  Prior to her admission for the thalamic  stroke, the patient noted some visual blurring involving the right and  left eyes intermittently and independently and has had some double  vision as well.  The patient comes into the emergency room today with a  history of onset of right eye altitudinal visual field deficit beginning  in the lower fields and slowly coming up in the visual field.   The  patient has not that pupillary defect on the right. CT scan of the head  done through the emergency room was unremarkable.  Neurology was asked  to consult on this patient for further evaluation.  The patient does  report headache most prominent in the right frontotemporal area.   Sed rate is 97.   PAST MEDICAL HISTORY:  Significant for:  1. New onset of right eye visual field disturbance, possible or      probable temporal arteritis.  2. Right thalamic infarct around February 7 or 8, 2009.  3. Polymyalgia rheumatica with anemia.  4. Pulmonary fibrosis.  5. Left total knee replacement.  6. Left mastectomy for carcinoma in situ.  7. Gallbladder resection.  8. Tonsillectomy.   MEDICATIONS:  1. Prednisone 5 mg daily.  2. Pravastatin 10 mg daily.  3. Aspirin 325 mg daily.   HABITS:  The patient does not smoke, drink.   ALLERGIES:  No known allergies.   SOCIAL HISTORY:  The patient is  married, lives in the [place], [place], area. Her husband is an invalid.  She is the caretaker.  The  patient has one child who is alive and well.   FAMILY MEDICAL HISTORY:  Mother died with a brain tumor.  Father died  with senile dementia of Alzheimer's type.  The patient has one sister  with breast cancer.   REVIEW OF SYSTEMS:  Notable for no some fevers, chills, night sweats,  neck pain, weight loss.  This been present starting in January 2009.  The patient does note some chronic shortness of breath.  Denies chest  pains, abdominal pains. Nausea and vomiting has been of problem in  January 2009.  The patient has had no problems controlling the bowels or  bladder.  The patient does have some numbness of the right hand and has  not had any problems with gait disturbance, blackout episodes, seizures.   PHYSICAL EXAMINATION:  VITAL SIGNS:  Blood pressure is 136/76, heart  rate 109, respiratory rate 18, temperature 98.6, going up to 101.5 in  the emergency room.  HEENT:   Examination is atraumatic.  Eyes:  Pupils are round and reactive  to light, very definite aberrant pupillary defect noted on the right.  The patient has flat disks bilaterally.  NECK:  Supple.  No carotid bruits noted.  RESPIRATORY:  Examination is clear.  CARDIOVASCULAR:  Examination reveals a regular rate and rhythm.  No  obvious murmurs, rubs noted.  EXTREMITIES:  Are without significant edema.  NEUROLOGIC:  Cranial nerves as above.  Facial symmetry is present.  The  patient has good sensation of face to pinprick and soft touch  bilaterally.  Has good strength to facial muscle, muscles to head  turning and shoulder shrug bilaterally.  Speech is well enunciated, not  aphasic.  Motor testing shows 5/5 strength in all fours.  Good symmetric  motor is noted throughout.  Sensory testing is intact to pinprick, soft  touch, and vibratory sensation throughout.  Position sense was not  tested.  The patient has again good finger-nose-finger and no toe-to-  finger bilaterally.  Gait was not tested.  Deep tendon reflexes  symmetric and normal. Toes downgoing bilaterally.   CT of the head is as above.   Laboratory values notable for white count of 16.9, hemoglobin of 11.0,  hematocrit of 33.1, MCV of 84.6,  platelets of  618. Sodium 135,  potassium 4.1, chloride 100, CO2 27, glucose 186, BUN of 26, creatinine  1.3. INR 1.1. Sed rate of 97.   IMPRESSION:  1. Onset of headache and visual disturbance.  2. Recent right thalamic stroke.  3. Malaise, fevers, chills, night sweats, likely polymyalgia      rheumatica.   The patient presents with a syndrome that is likely temporal arteritis  associated with polymyalgia rheumatica.  Will need to initiate high-dose  Solu-Medrol therapy to hopefully prevent further visual loss.  The  patient will be admitted for further evaluation.   PLAN:  1. CT angiogram of intracranial and extracranial vessels to fully      exclude a carotid artery dissection.   2. Solu-Medrol 250 mg IV q.6 h.  3. Consider temporal artery biopsy at some  point in the next several      days.  4. Will continue Solu-Medrol for about 3 days at high dose and then      convert to oral steroids.  5. The patient is also apparently Boniva and vitamin D  supplementation.      Jill Alexanders, M.D.  Electronically Signed     CKW/MEDQ  D:  11/15/2007  T:  11/16/2007  Job:  [id number]   cc:   Biagio Borg, MD  [address]  [place]  [place] [address]   Jeanie Cooks, M.D.  Fax: [facility]  [address], [address]

## 2011-02-06 NOTE — Assessment & Plan Note (Signed)
[facility]   NAME:  [name], GOERTZEN NO.:  [id number]   MEDICAL RECORD NO.:  [id number]      DATE OF BIRTH:  07-27-1941   PHYSICIAN:  Orlando Penner. Sevier, M.D.  VISIT DATE:  10/06/2008                                   OFFICE VISIT   HISTORY:  This 70 year old white female who is on steroid therapy has  been followed for refractory stasis ulceration on the lateral aspect of  the left ankle.  This has been making grand progress of late and  apparently that progress has continued over the past week.   Unfortunately, she recently had a fall and sustained an abrasion on the  right patellar area and on the left dorsal hand area.  Both of these  were superficial.  She had been keeping them clean and applying some  Iodosorb gel which she had on hand.   EXAMINATION:  Blood pressure is 116/79, pulse 90, respirations 18,  temperature 91.1.  The ankle wound now measures 0.2 x 0.2 x 0.1 cm and  is for all intention purposes have essentially healed.  The abrasions on  her knee and hand are relatively innocent and are described and measured  in the nurse's notes.   IMPRESSION:  Near healing of stasis ulcer left lower extremity.   DIAGNOSIS:  New traumatic abrasions right knee and left hand.   DISPOSITION:  The ankle wound looks great but particularly with her  being on chronic steroid therapy.  I think the area needs to be  considerably tougher before she is transitioned into compression hose.  However, she will be measured today for such hose and those will be  ordered therefore be ready when we need them.  Meanwhile, we will dress  that wound simply with an application of a SofSorb pad and a Profore  dressing to that lower extremity.  That will be changed in 1 week by the  nurse and then I will see the patient again in 2 weeks and hopefully can  release her.   The patient is instructed to continue using the Iodosorb gel and dry  dressings on her hand  and knee wounds with anticipation that those will  slowly but progressively heal.   As above return will be to the nurse in 1 week, for doctor in 2 weeks.           ______________________________  Orlando Penner London Pepper, M.D.     RES/MEDQ  D:  10/06/2008  T:  10/07/2008  Job:  [id number]

## 2011-02-06 NOTE — Assessment & Plan Note (Signed)
[facility]                             PULMONARY OFFICE NOTE   NAME:[name], [name]                     MRN:          [id number]  DATE:05/12/2007                            DOB:          [birth year]    [name] returns in followup.  She is a 70 year old white female.  History of idiopathic pulmonary fibrosis bronchiectasis.  Patient is  having some postnasal drainage, minimal cough, but is improved compared  to her visit in July, when she received a course of prednisone and  Avelox.   Patient maintains:  1. Mucinex 2 nightly.  2. Prednisone 5 mg daily.   EXAMINATION:  Temperature 98.  Blood pressure 130/74.  Pulse 84.  Saturation 97% on room air.  CHEST:  Showed diminished breath sounds, dry rales at the bases.  CARDIAC:  Exam showed a regular rate and rhythm without S3.  Normal S1  and S2.  ABDOMEN:  Soft and nontender.  EXTREMITIES:  Showed no edema or clubbing.  SKIN:  Was clear.  NEUROLOGIC:  Exam was intact.  HEENT:  Exam showed no jugular venous distention.  No lymphadenopathy.  Oropharynx clear.  NECK:  Supple   IMPRESSION:  Pulmonary fibrosis and chronic asthmatic bronchitis, stable  at this time.   PLAN:  Maintain current inhaled medicines as is, and we will see the  patient back in return followup.     Burnett [name], MD, [facility]  Electronically Signed    PEW/MedQ  DD: 05/12/2007  DT: 05/13/2007  Job #: [id number]

## 2011-02-06 NOTE — Assessment & Plan Note (Signed)
[facility]   NAME:  [name], [name] NO.:  [id number]   MEDICAL RECORD NO.:  [id number]      DATE OF BIRTH:  [birth year]   PHYSICIAN:  Orlando Penner. Sevier, M.D.  VISIT DATE:  09/01/2008                                   OFFICE VISIT   HISTORY:  This 70 year old white female who is steroid dependent based  on systemic rheumatoid arthritis is being seen for a stasis ulceration  in the left lateral supramalleolar area.  This has been slow to progress  as would not be surprising and that she is on 30 mg of prednisone per  day.  She has been treated most recently with Silvercel absorptive pad  and Profore wrap.  She has had no fever or systemic symptoms and is  unaware of any odor from the wound, any increased drainage, bleeding, or  other problems.   PHYSICAL EXAMINATION:  VITAL SIGNS:  Blood pressure 123/53, pulse 103,  respirations 20, and temperature 98.3.  The wound itself now measures 1.1 x 1.2 x 0.2 cm, which is a just a bit  smaller than it had been.  The wound base still shows some fibrous  material and does not have a good granular base.   IMPRESSION:  Gradual progress in chronic stasis wound, left lower  extremity in a patient who is steroid dependent.   DISPOSITION:  The wound is sharply debrided of as much of that fibrous  material as I can get out and also some soft slough.  There is also a  small amount of dried blood near one of the margins that is removed.   The wound is then treated with an application of Prisma to the base and  this is covered with a SofSorb pad and the extremity returned to a  Profore wrap.   Followup visit will be here in 1 week.           ______________________________  Orlando Penner. London Pepper, M.D.     RES/MEDQ  D:  09/01/2008  T:  09/02/2008  Job:  [id number]

## 2011-02-06 NOTE — Assessment & Plan Note (Signed)
[facility]   NAME:  [name], SANGUINO NO.:  [id number]   MEDICAL RECORD NO.:  [id number]      DATE OF BIRTH:  10-22-1940   PHYSICIAN:  Ricard Dillon, M.D. VISIT DATE:  09/27/2008                                   OFFICE VISIT   Ms. Karcher is a 70 year old woman with steroid dependent, who has been  following for largely a traumatic ulcer on the distal lateral aspect of  her left calf.  We have been using Prisma and a peripheral wrap on this.  There, we also placing a SofSorb over this for good periwound edema  control.   PHYSICAL EXAMINATION:  VITAL SIGNS:  Her temperature is 98.1.  She looks  well.   The ulcer itself continues to recede, now measuring 0.3 x 0.3 x 0.1.  Most of the rest of the ulcer is 100% epithelialized.  There is only the  small open area noted above.  We have decent periwound edema.  Nothing  needed culturing or debriding.   IMPRESSION:  Satisfactory progression of a chronic wound in the setting  of chronic prednisone use (25 mg).  We continue the same regimen of  prism, SofSorb and Profore.  The patient is going to the beach for 4  days.  We will see her back in a week.  I am cautiously optimistic, the  wound may be resolved by then.           ______________________________  Ricard Dillon, M.D.     MGR/MEDQ  D:  09/27/2008  T:  09/27/2008  Job:  [id number]

## 2011-02-06 NOTE — Assessment & Plan Note (Signed)
[facility]                             PULMONARY OFFICE NOTE   NAME:FARLOWKadance, [name]                     MRN:          [id number]  DATE:07/14/2007                            DOB:          1940-12-09    The patient returns in follow-up with underlying pulmonary fibrosis,  reflux disease, overall doing well with decreased shortness of breath,  decreased cough.  The patient maintains prednisone 5 mg daily.  She had  questions regarding restarting Boniva.   PHYSICAL EXAMINATION:  VITAL SIGNS:  Temperature 98, blood pressure  114/80, pulse 97, saturations 100% on room air.  CHEST:  Dry rales at the bases.  No wheeze or rhonchi were noted.  HEART:  Regular rate and rhythm without S3.  Normal S1 and S2.  ABDOMEN:  Soft and nontender.  EXTREMITIES:  No edema or clubbing.  SKIN:  Clear.   IMPRESSION:  Pulmonary fibrosis, idiopathic, stable at this time.  Recent stoppage of Boniva has not resulted in any change in her cough.  At this point we recommended that she restart the [facility] unless  instructed otherwise per Dr. Jenny Reichmann.  We will see the patient back in  follow-up.     [name] [name], MD, [facility]  Electronically Signed    PEW/MedQ  DD: 07/14/2007  DT: 07/15/2007  Job #: [id number]   cc:   Biagio Borg, MD

## 2011-02-06 NOTE — Consult Note (Signed)
NAMEDEVERA, MOY NO.:  [id number]   MEDICAL RECORD NO.:  [id number]          PATIENT TYPE:  INP   LOCATION:  88                         FACILITY:  [facility]   PHYSICIAN:  Ernst Spell, MD DATE OF BIRTH:  1941/01/25   DATE OF CONSULTATION:  11/02/2007  DATE OF DISCHARGE:                                 CONSULTATION   REQUESTING PHYSICIAN:  Dr. Arnoldo Morale.   REASON FOR CONSULT:  Patient is seen in consultation at the request of  Dr. Arnoldo Morale for further evaluation of paresthesias.   HPI:  Patient is a very pleasant 70 year old woman with a history of  pulmonary fibrosis, remote breast cancer who presented to the hospital  with a 4-day history of numbness and tingling in her hands and feet.  The patient first noticed this on Thursday night, which was 4 days ago,  where she started having numbness and paresthesias in her right foot.  She tended to move around and it would kind of come and go but persisted  for most of the night.  The next morning she also had paresthesias and  numbness in the first 3 digits of her right hand.  She also started  getting the numbness eventually into her left foot as well.  In addition  to the paresthesias, the patient noted some blurry vision initially in  her right eye but then in her left eye as well that has subsequently  resolved.  She had a little bit of a dull headache located frontally  with this but no nausea or vomiting.  She did also have some diplopia  when she was looking laterally to either side.  Because her symptoms  persisted, she eventually came to the emergency room and was admitted  for further workup.  The patient has been ill over the past month, she  has had the stomach virus, flu, pneumonia and sinusitis.  She has been  feeling very fatigued and generalized weakness, reduced appetite and she  has actually lost 6 pounds over the past month.  She otherwise denies  any focal weakness or troubles with walking  other than that trouble she  has because her foot is numb.  She has no other chest pain, shortness of  breath, vertigo, nausea or vomiting.   No known drug allergies.   HOME MEDICATIONS:  1. Prednisone 5 mg daily.  2. Aspirin 81 mg daily.  3. Boniva.  4. Avelox done over the past 9 days.  5. Vitamin D.   PAST MEDICAL HISTORY:  Notable for:  1. Pulmonary fibrosis on chronic steroids.  2. Persistent leukocytosis which is felt secondary to the steroid use.  3. Cholecystectomy.  4. History of breast cancer in 1991, status post mastectomy but no      chemotherapy or radiation.  5. History of right carpal tunnel.  6. History of left knee replacement.  7. Osteoporosis.   SOCIAL AND FAMILY HISTORY:  1. The patient is married.  2. She lives with her husband here in [place].  3. She does not drink any alcohol or smoke or use any other drugs.  4. Her mother had a stroke.  5. Her father with Alzheimer's disease.   REVIEW OF SYSTEMS:  Please see H&P.  The remainder of 10 systems is  negative.   EXAM:  A very pleasant woman in no acute distress.  She is afebrile,  pulse 78, respirations 18, blood pressure is 132/73, she is 97% on room  air.  HEENT:  Head is normocephalic, atraumatic.  Oropharynx is moist without  exudate>  NECK:  Supple.  No carotid bruits were auscultated.  HEART:  Regular with normal S1 S2.  ABDOMEN:  Soft, nondistended.  EXTREMITIES:  Showed no significant edema.  Skin was warm, dry and  without rash.  NEUROLOGICAL EXAM:  The patient is alert and oriented x3, her speech is  fluent, comprehension, fund of knowledge are intact.  CRANIAL NERVE EXAM:  Her pupils were equally round and reactive to  light.  Extraocular movements were intact.  She had no objective  complaints of diplopia.  I did not detect any ophthalmoparesis.  She  does have mild ptosis of the left eyelid.  Facial sensation was intact.  Facial strength was also intact bilaterally.  Hearing was  intact to  audible rub.  Palate elevation was symmetric.  Shoulder shrug intact.  Tongue protruded in midline with normal strength.  MOTOR EXAM:  The patient has normal tone throughout.  She had 5/5  strength in her bilateral deltoids and biceps.  She had trace weakness  bilaterally in her triceps but appears symmetric.  Hand grip was  slightly decreased in her right hand.  Intrinsics were 5/5.  Lower  extremities demonstrated 5/5 strength bilaterally in her hip flexion,  knee flexion/extension, dorsiflexion and plantar flexion.  SENSORY EXAM:  She had decreased light touch, temperature and pinprick  in her first 3 digits distally on her right hand in a median nerve  distribution.  Otherwise, no other sensory loss in her upper  extremities.  Her lower extremities demonstrated very distal decreased  light touch, temperature, proprioception and vibration bilaterally, left  greater than right however.  This did not appear in a local  distribution.  Her reflexes were symmetric and 2+ in her upper  extremities, biceps, triceps and brachioradialis, 2+ at the knees  bilaterally but decreased at the ankles bilaterally.  There was an  equivocal response with Babinski on the left but her toe did go down on  the right.  COORDINATION:  Demonstrated normal finger-to-nose, heel-to-shin and  rapid alternating movements.  GAIT:  Demonstrated no significant ataxia, although she had slight  balance problems due to her sensory deficits.  Absent Romberg.   I reviewed the patient's labs.  She does have an elevated white count of  20.6 with a neutrophil that is predominant.  Her chemistries were  otherwise unremarkable.   IMPRESSION:  Numbness and paresthesias of subacute onset in her  bilateral distal feet and right hand.  She has associated left ptosis as  well as subjective diplopia at times.  She seems to have both peripheral  sensory nerve findings in addition to potential cranial nerves in a   central process.  Given her recent history of multiple infections,  Guillain-Barre syndrome is certainly possible.  Also consider  peraneoplastic syndrome given her history of breast cancer as well as a  metabolic or nutritional related neuropathy.  Will also need to rule out  central or cord process.   RECOMMENDATIONS:  I agree with obtaining an MRI of her brain as well as  C-spine.  I would check multiple labs for screening neuropathy including  B12, folate, RPR, serum and urine electrophoresis, TSH, a peraneoplastic  panel and ANA.  We will try to obtain a nerve conduction study and EMG  tomorrow, if possible.  Would also recommend a lumbar puncture.  Given  her steroid use this may be a little bit difficult to interpret but it  should help  [place] in evaluation for Guillain-Barre or a peraneoplastic syndrome.  Will  continue to follow this patient but please do not hesitate to call with  any additional questions or concerns.   Dictation ended at this point.      Ernst Spell, MD  Electronically Signed     DEE/MEDQ  D:  11/02/2007  T:  11/02/2007  Job:  [id number]

## 2011-02-06 NOTE — Discharge Summary (Signed)
NAMEIYANNAH, GUMINA NO.:  [id number]   MEDICAL RECORD NO.:  [id number]          PATIENT TYPE:  INP   LOCATION:  5523                         FACILITY:  [facility]   PHYSICIAN:  Valerie A. Asa Lente, MDDATE OF BIRTH:  04-02-1941   DATE OF ADMISSION:  11/20/2007  DATE OF DISCHARGE:  11/26/2007                               DISCHARGE SUMMARY   DISCHARGE DIAGNOSES:  1. Altered mental status in the setting of the posterior reversible      encephalopathy  syndrome and likely seizure.  2. Small acute infarct noted on magnetic resonance imaging.  3. Diabetes type 2, steroid induced.  4. Temporal arteritis.  5. History of polymyalgia rheumatica.  6. Acute renal insufficiency.  Creatinine at time of discharge is      1.25.  7. Dyslipidemia.  8. Anemia of chronic disease - iron deficiency.   HISTORY OF PRESENT ILLNESS:  Ms. Lorang is a 70 year old white female  admitted on November 20, 2007 with chief complaint of altered level of  consciousness per family.  She was discharged from [facility]  on November 19, 2007 after being treated for presumed temporal  arteritis.  According to the patient's stepson, she had reported a  headache on the evening prior to admission before going to bed.  When he  checked on the patient at 7 a.m. this morning, she was sleeping.  He  checked on her again at 8 a.m. and found the patient on the floor  incontinent of urine, bleeding in the mouth and with altered mental  status.  She was confused, unable to complete sentences or get up.  Her  blood glucose was 138 when EMS arrived.  The patient was oriented to  name and place only in the emergency department and was noted to have  generalized weakness.  She was admitted for further evaluation and  treatment.   PAST MEDICAL HISTORY:  1. Presumed temporal arteritis on prednisone.  2. Steroid-induced diabetes type 2.  3. Right thalamic infarct February 2009.  4. Polymyalgia  rheumatica.  5. Pulmonary fibrosis treated by Dr. Asencion Noble on chronic      prednisone 5 mg daily for years secondary to pulmonary      fibrosis/polymyalgia rheumatica.  6. Organic gait disorder with peripheral neuropathy.  7. Dyslipidemia.  8. History of breast cancer status post left mastectomy.  9. Right carpal tunnel syndrome.  10.History of left total knee replacement.  11.Cholecystectomy.  12.Tonsillectomy.  13.Chronic leukocytosis.  14.Osteoarthritis.   HOSPITAL COURSE:  Altered mental status secondary to PRES syndrome and  likely seizure.  The patient was admitted and underwent a CT of the head  which showed questionable new left thalamic infarcts since November 02, 2007.  She was seen in consultation by neurology initially, Dr. Antony Contras.  An MRI was performed which showed extensive acute edema pattern  most consistent with PRES syndrome.  It also noted a small acute infarct  in the anterior limb of the external capsule.  The patient was converted  back to prednisone from Solu-Medrol.  It was felt that she likely  had a  seizure on admission secondary to PRES syndrome, but it was not felt  that she likely needed long-term anticonvulsants.  Plan to continue  prednisone treatment for temporal arteritis.   DISCHARGE MEDICATIONS:  1. Aspirin 325 mg p.o. daily.  2. Zocor 5 mg p.o. daily.  3. Vitamin D 400 units p.o. daily.  4. NovoLog sliding-scale coverage t.i.d. a.c. meals and h.s.  5. Lovenox 40 mg subcu. daily for DVT prophylaxis.  6. Norvasc 5 mg p.o. daily.  7. Multivitamin with minerals 1 tablet p.o. daily.  8. Benazepril 5 mg p.o. daily.  9. Ferrous sulfate 325 mg p.o. daily.  10.Prednisone 60 mg p.o. daily.  11.Pepcid 20 mg p.o. b.i.d.  12.Acetaminophen 650 mg p.o. q.4 h. p.r.n.  13.Zofran 4 mg IV q.4 h. p.r.n.   DISPOSITION:  The patient will be discharged to [facility].   FOLLOW UP:  Upon discharge and as needed, she will  need follow up with  primary care Genean Adamski at [facility], Dr. Adella Hare.   PHYSICAL EXAMINATION:  VITAL SIGNS:  BP 156/83, heart rate 69,  respiratory rate 18, temperature 98.3, O2 sat 99% on room air.  GENERAL:  The patient is an elderly white female who appears younger  than stated age.  CARDIOVASCULAR:  S1-S2, regular rate and rhythm is  noted.  LUNGS:  Clear to auscultation bilaterally without wheezes, rales or  rhonchi.  ABDOMEN:  Soft, nontender, nondistended.  Positive bowel sounds are  noted.  EXTREMITIES:  No peripheral edema.  She does have some wrinkling of the  lower extremities suggestive of previous edema which has resolved.  NEURO:  The patient is awake, alert and oriented x4.  She is moving all  extremities.  She has no focal neuro deficits.  She answers questions  appropriately.  Speech is clear.  Positive facial symmetry is noted   PERTINENT LABORATORY DATA:  At time of discharge:  BUN 43, creatinine  1.25, hemoglobin 9.5, hematocrit 28.8.      Debbrah Alar, NP      Jannifer Rodney. Asa Lente, MD  Electronically Signed    MO/MEDQ  D:  11/26/2007  T:  11/26/2007  Job:  [id number]   cc:   Heinz Knuckles. Norins, MD  C. Floyde Parkins, M.D.

## 2011-02-06 NOTE — Assessment & Plan Note (Signed)
[facility]   NAME:  [name], [name] NO.:  [id number]   MEDICAL RECORD NO.:  [id number]      DATE OF BIRTH:  06/07/1941   PHYSICIAN:  Orlando Penner. Sevier, M.D.  VISIT DATE:  10/20/2008                                   OFFICE VISIT   HISTORY:  This 70 year old female who has been followed for stasis  ulceration on the left lower extremity, proximal to the lateral  malleolus, and also for two small traumatic wounds on the left posterior  hand, dorsal hand, and the right anterior knee, both recently acquired.   She arrives today with no complaints with respect to her extremity,  which she has been wrapped and reporting that the other two  wounds have  essentially healed.   PHYSICAL EXAMINATION:  Blood pressure is 135/93, pulse 82, respirations  20, temperature 98.5.  Indeed the two small traumatic wounds have healed  and upon removal of her compressive wrap, the left ankle ulceration is  likewise completely resolved.   IMPRESSION:  Resolution of stasis ulceration, left lower extremity.   DISPOSITION:  The patient is today placed in her compression stocking on  the left lower extremity and instructed how and when to wear it and is  released from further followup with return to me on p.r.n. basis only.           ______________________________  Orlando Penner London Pepper, M.D.     RES/MEDQ  D:  10/20/2008  T:  10/21/2008  Job:  [id number]

## 2011-02-06 NOTE — Discharge Summary (Signed)
[name], [name] NO.:  [id number]   MEDICAL RECORD NO.:  [id number]          PATIENT TYPE:  OBV   LOCATION:  [place]                         FACILITY:  [facility]   PHYSICIAN:  Heinz Knuckles. Norins, MD  DATE OF BIRTH:  04-13-1941   DATE OF ADMISSION:  01/12/2009  DATE OF DISCHARGE:  01/13/2009                               DISCHARGE SUMMARY   A 24 HOUR OBSERVATION   ADMISSION DIAGNOSES:  1. Fatigue and weakness.  2. Headache in the left temporal region with a visual change.  3. Temporal arteritis with the patient having been step-downed on her      steroid dose of 12.5 mg based on uncontrolled sed rate.  4. Hypertension.  5. Type 2 diabetes.  6. Known bronchiectasis.  7. History of left mastectomy.  8. History of acute sinusitis.  9. Nausea without emesis.   DISCHARGE DIAGNOSES:  1. Fatigue and weakness.  2. Headache in the left temporal region with a visual change.  3. Temporal arteritis with the patient having been step-downed on her      steroid dose of 12.5 mg based on uncontrolled sed rate.  4. Hypertension.  5. Type 2 diabetes.  6. Known bronchiectasis.  7. History of left mastectomy.  8. History of acute sinusitis.  9. Nausea without emesis.   CONSULTANTS:  None.   PROCEDURE:  CT scan of the brain which showed no significant  abnormality.   HISTORY OF PRESENT ILLNESS:  The patient is a 70 year old woman who has  had a difficult year with a history of strokes, temporal arteritis,  visual loss.  She has been followed closely by both Internal Medicine as  well as Dr. Jannifer [name] for Neurology.  Prednisone dosing has been regulated  based on a sedimentation rate since she has been able to wean down to a  dose of 12.5 mg daily.   The patient reports that Monday, January 10, 2009, she had the onset of  some discomfort and pain in the left maxillary sinus area.  She thought  this was from a tooth, but then she developed increasing pain over the  entire left  cheek.  She reports that she was also having headache in the  left temporal region.  She reports increased urinary frequency at night  and that she was just not feeling well.  The patient did have nausea  without emesis.  She did not have diarrhea.  Because of her persistent  symptoms, she presented to [facility] Emergency Department for  evaluation.  CT was unremarkable as noted.  Lab work was unremarkable  with a hemoglobin 12.9 gm, white count was 9500 with a normal  differential.  Chemistry panel was unremarkable.  Creatinine was 1.28,  BUN 13.  Liver functions were normal.  Cardiac enzymes were negative.  Urinalysis was normal.  Because of the patient's persistent weakness,  headache and decreased vision, she was admitted for observation and  further lab studies.   Please see the PMR, H and P for past medical history, past surgical  history, family history and social history as well as admission  exam.   HOSPITAL COURSE:  1. Fatigue and malaise.  The patient continued to have somewhat      fatigue and malaise at the time of this dictation, but was feeling      stronger and did feel stronger after return home.  2. Headache.  The patient has a mild headache in the left temporal      region as well as continued change in her vision.  Her pain is not      to the point where she requires any additional supplemental      medication to her baseline medication.  3. Temporal arteritis.  The patient's sed rate on the day of discharge      was 60 mm per hour.  This is clearly elevated from the previous      study and this may be the origin of her discomfort in her left      temporal region as well as visual change.  Plan, the patient will      be  increased in terms of prednisone dosing to 20 mg daily.  She      does have an appointment with Dr. Jannifer [name] on this coming Monday.  I      have asked her to go by the lab either at Dr. Jannifer [name]' office or at      [facility] for a repeat  sedimentation rate.  4. Hypertension, stable.  She will continue her home medications.  5. Diabetes mellitus, stable and she will continue with sliding-scale      coverage.  6. Pulmonary.  Patient with known bronchiectasis, currently stable at      this time with no respiratory distress.  7. History of breast cancer and left mastectomy, stable.  8. Sinusitis.  The patient with no fever at this time.  She has no      tenderness to palpation on exam.  She says the discomfort she was      having earlier in the week has resolved.  9. Gastrointestinal.  Patient with some persistent nausea without      emesis.  She has no tenderness on examination.  Suspect she may      have a prednisone related mild gastritis.  Plan, the patient is to      change from Pepcid to Prilosec taking 2 for a total 40 mg q.a.m.      until seen in followup.  10.Chronic back pain.  The patient has not been able to schedule      surgical repair of her back due to her use of chronic steroids.   PHYSICAL EXAMINATION:  VITAL SIGNS:  Pulse 72, heart rate 81,  respirations 20, O2 sat is 93% on room air.  GENERAL APPEARANCE:  This  is a mildly cushingoid-appearing Caucasian female who is in no acute  distress.  HEENT:  The patient had no tenderness to percussion of the left  maxillary region.  CHEST:  Patient is moving air well with no wheezing.  CARDIOVASCULAR:  The patient had a quiet precordium.  HEART:  Rate was regular.  ABDOMEN:  Soft with positive bowel sounds.  She did not have tenderness  in the epigastric region.  DERM:  The patient does have some mild skin bruising.   FINAL LABORATORY:  The patient in addition to admission lab had a TSH of  1.5 and ESR of 60 as noted.   DISPOSITION:  The patient is discharged home, but she will be staying  at  her sister's, so she does have some support.   MEDICATIONS:  The patient will continue her home medications that  includes:  1. Boniva 150 mg monthly.  2. Zocor 5 mg  daily.  3. Prednisone to be increased to 20 mg daily.  4. Aspirin 81 mg daily.  5. Azor 5/20 daily.  6. Lasix 40 mg daily.  7. Multivitamin daily.  8. OxyContin 20 mg b.i.d.  9. Oxycodone IR 5 mg q.6 h. p.r.n. breakthrough pain.  10.Vitamin D 1000 units daily.  11.Calcium, magnesium and zinc supplement daily.  12.Iron 65 mg daily.  13.There will be no new medications with the only change being an      increase in her prednisone dose.   DISPOSITION:  The patient is discharged to home.   FOLLOW UP:  She will keep her appointment with Dr. Floyde Parkins this  coming Monday, January 17, 2009.      Heinz Knuckles Norins, MD  Electronically Signed     MEN/MEDQ  D:  01/13/2009  T:  01/13/2009  Job:  [id number]   cc:   Jill Alexanders, M.D.  Fax: [phone]

## 2011-02-06 NOTE — H&P (Signed)
[name], [name] NO.:  [id number]   MEDICAL RECORD NO.:  [id number]          PATIENT TYPE:  INP   LOCATION:  [place]:  [facility]   PHYSICIAN:  Marletta Lor, MDDATE OF BIRTH:  Jul 25, 1941   DATE OF ADMISSION:  11/15/2007  DATE OF DISCHARGE:                              HISTORY & PHYSICAL   CHIEF COMPLAINT:  Headache and diminished vision right eye.   HISTORY OF PRESENT ILLNESS:  The patient is a 70 year old female with a  history of cerebrovascular disease.  She was admitted on November 02, 2007 after presenting with right-sided dysesthesias.  At that time she  also gave a history of intermittent diplopia and some right-sided visual  disturbances that had cleared.  She had an extensive neurological  evaluation at that time, including a brain MRI that revealed a right  thalamic lacunar stroke.  Due to fever, history of breast cancer,  leukocytosis.  The patient underwent extensive para-neoblastic workup,  as well.  The patient was discharged 2 days later and has continued to  have chronic right-sided dysesthesias involving her right hand and right  lower extremity.  There has also been some suspicion for right carpal  tunnel syndrome.  The patient was also diagnosed as having a peripheral  polyneuropathy with an abnormal gait.  Her status was stable until this  morning.  She awoke with headache, involved the right region.  She  noticed visual disturbances initially described as a bright halo around  her of visual field, involving the right eye only.  She denied any  double vision.  Throughout the day, however, she has noted progressive  blurring and darkening of her visual feel.  It is most marked inferiorly  has progressed throughout the day, to involve according to her  estimation, her lower three-quarters visual field.  The patient was  evaluated in the ED, and a head CT was performed that was negative.  A  brain MRI is  pending.   The patient is now admitted for further evaluation and treatment of her  right-sided visual loss.   PAST MEDICAL HISTORY:  The patient has a history of peripheral  polyneuropathy with gait abnormality and a recent diagnosis as mentioned  of a right thalamic lacunar stroke.  She has history of chronic  pulmonary fibrosis followed by Dr. Joya Gaskins.  She has also been followed  by Dr. Ralene Ok for leukocytosis, anemia.  Due to recent dyslipidemia,  she has been placed on Pravachol therapy.  She has remote history of  breast cancer and prior mastectomy.  Additionally, she has a history of  osteoporosis and osteoarthritis.  She is status post left total knee  replacement surgery.  She has also had a remote cholecystectomy.   SOCIAL HISTORY:  Please see note for further details.  She is a  nonsmoker, married, lives with her husband.   PRESENT MEDICAL REGIMEN:  Includes the following:  1. Prednisone 5 mg daily.  2. Aspirin 81 mg daily.  3. Boniva monthly.  4. Pravastatin 10 mg daily.  5. Vitamin D and calcium supplementation.  EXAMINATION:  GENERAL:  Revealed a well-developed, alert, white female  in no acute distress.  VITAL SIGNS:  Unremarkable, except a resting tachycardia with a pulse of  104.  Temperature 98.6, O2 saturation 98%.  SKIN:  Quite warm.  No rash noted.  NECK:  Revealed no signs of trauma.  HEENT:  Nose and throat unremarkable and neck revealed no bruits.  CHEST:  Revealed dry rales involving the lower 1/3 lung field.  CARDIOVASCULAR:  Revealed a resting tachycardia.  No murmur or gallop  noted.  ABDOMEN:  Soft and nontender.  EXTREMITIES:  Revealed no significant edema.  NEUROLOGIC:  Revealed the patient be alert and oriented with normal  speech.  The left pupil was mid-position and briskly reactive.  The  right pupil was slightly dilated and only very weakly reactive.  Extraocular muscles were intact.  There is no facial asymmetry. Facial  sensation was  intact.  Tongue and uvula were midline.  Motor exam  revealed no drift of the outstretched arms.  Finger-to-nose and heel-to-  shin testing were performed fairly normal.  Heel-to-shin was not  performed quite as well involving the left, but this was felt related  more to be left knee stiffness.   IMPRESSION:  Right-sided headaches associated with right visual field  loss, with anisocoria.   ADDITIONAL DIAGNOSES:  History of cerebrovascular disease and prior  right thalamic infarct, pulmonary fibrosis, leukocytosis, history of  anemia, dyslipidemia.   DISPOSITION:  The patient will be admitted to the hospital.  A brain MRI  will be obtained, as well as a neurology consultation.      Marletta Lor, MD  Electronically Signed     PFK/MEDQ  D:  11/15/2007  T:  11/16/2007  Job:  [id number]

## 2011-02-06 NOTE — Assessment & Plan Note (Signed)
[facility]                                 ON-CALL NOTE   [name], [name]                     MRN:          [id number]  DATE:02/22/2008                            DOB:          [birth year]    PRIMARY CARE PHYSICIAN:  Dr. Jenny Reichmann.   SUBJECTIVE:  Blood pressure is 190/102.  She does not currently have  headache, chest pain, or shortness of breath.  She is concerned that her  blood pressure is elevated.  She was previously on what sounds like  lisinopril, which was stopped due to problems with kidney function.  She  is currently on Norvasc 5 mg daily.  The patient has history of stroke  in the past.   ASSESSMENT AND PLAN:  She will increase Norvasc to 10 mg daily.  If her  blood pressure does not come down less than 160/90,  then she will be  seen at the emergency room as well as if she has chest pain, shortness  of breath, severe headache, or signs of stroke.     Eliezer Lofts, MD  Electronically Signed    AB/MedQ  DD: 02/22/2008  DT: 02/23/2008  Job #: [id number]

## 2011-02-06 NOTE — Assessment & Plan Note (Signed)
[facility]                             PULMONARY OFFICE NOTE   NAME:FARLOWShiley, Linz                     MRN:          [id number]  DATE:04/21/2007                            DOB:          18-Mar-1941    HISTORY OF PRESENT ILLNESS:  The patient is a 70 year old white female  patient of Dr. Bettina Gavia who had a known history of underlying pulmonary  fibrosis and bronchiectasis who presents today for an acute office  visit. The patient complains over the last three days he has had  productive cough of thick mucus, nasal congestion and chills. The  patient has recently underwent a left total knee replacement on April 09, 2007. The patient reports she has been recovering slowly at home. She  denies any hemoptysis, orthopnea, PND or leg swelling. The patient  reports that her cough is worsened at night.   PAST MEDICAL HISTORY:  Is reviewed.   CURRENT MEDICATIONS:  Reviewed.   PHYSICAL EXAMINATION:  The patient is a morbidly obese female in no  acute distress. She is afebrile with stable vital signs. O2 saturation  is 95% on room air.  HEENT:  Is unremarkable.  NECK:  Is supple without cervical adenopathy. No JVD.  LUNGS:  Sounds revealed coarse breath sounds with a few bibasilar  crackles.  CARDIAC:  Regular rate and rhythm.  ABDOMEN:  Soft and nontender.  EXTREMITIES:  Warm without any calf tenderness, cyanosis, clubbing. Left  knee with a healing surgical scar. No significant redness. There is  trace to 1+ edema on the left.   IMPRESSION AND PLAN:  Acute bronchiectatic flare. The patient is to get  Avelox for 7 days. Increase prednisone up to 20 mg daily. Add in Mucinex  DM twice daily. I have also recommended patient use Prilosec 20 mg daily  over the next two weeks for any residual reflux that could be irritating  airways. The patient will return back with Dr. Joya Gaskins in 1 week or  sooner if needed. The patient is to contact our office if symptoms  are  not improving or worsening for sooner followup.      Rexene Edison, NP  Electronically Signed      Burnett [name], MD, [facility]  Electronically Signed   TP/MedQ  DD: 04/22/2007  DT: 04/23/2007  Job #: [id number]

## 2011-02-06 NOTE — Assessment & Plan Note (Signed)
[facility]                             PULMONARY OFFICE NOTE   NAME:FARLOWDeania, [name]                     MRN:          [id number]  DATE:03/14/2007                            DOB:          [birth year]    [name] is here today for surgical clearance. She needs to have left  knee total replacement. She has underlying pulmonary fibrosis and  bronchiectasis. She is on prednisone 5 mg daily for this and is doing  fairly well with minimal cough. There is no significant dyspnea. Other  maintenance medicines include aspirin 81 mg daily which she is currently  holding, vitamin D weekly, and Boniva 150 mg monthly.   PHYSICAL EXAMINATION:  VITAL SIGNS:  Temperature 98, blood pressure  128/84, pulse 98, saturation 94% on room air.  CHEST:  Showed dry rales at the bases, no wheeze or rhonchi were noted.  CARDIAC:  Regular rate and rhythm without S3, normal S1, S2.  ABDOMEN:  Soft and nontender.  EXTREMITIES:  No edema or clubbing.  SKIN:  Clear.   Spirometry was obtained and showed essentially normal spirometry.  Ambulatory pulse oximetry on room air was reviewed and revealed a  desaturation of only 88% after 3 laps, with 1 lap being 185 feet.   IMPRESSION:  Stable pulmonary function with underlying pulmonary  fibrosis, bronchitis. The patient is cleared from the medical standpoint  for planned surgical intervention.   RECOMMENDATIONS:  Maintain prednisone 5 mg daily and she can be cleared  from a pulmonary standpoint. We will see the patient back in follow up  and are available if Dr. Theda Sers needs our assistance after surgery.     Burnett [name], MD, [facility]  Electronically Signed    PEW/MedQ  DD: 03/14/2007  DT: 03/15/2007  Job #: [id number]   cc:   Biagio Borg, MD  Cynda Familia, M.D.

## 2011-02-06 NOTE — Discharge Summary (Signed)
NAMEMIAMOR, PIGOTT NO.:  [id number]   MEDICAL RECORD NO.:  [id number]          PATIENT TYPE:  INP   LOCATION:  1427                         FACILITY:  [facility]   PHYSICIAN:  Valerie A. Asa Lente, MDDATE OF BIRTH:  1941-08-05   DATE OF ADMISSION:  11/01/2007  DATE OF DISCHARGE:  11/04/2007                               DISCHARGE SUMMARY   DISCHARGE SUMMARY   DISCHARGE DIAGNOSES:  1. Peripheral polyneuropathy with organic gait disorder, acute on-      chronic.  2. Probable right carpal tunnel syndrome for outpatient nerve      conduction studies.  3. Acute right thalamic cerebrovascular accident, apparently      asymptomatic, workup negative.  4. Dyslipidemia, with very low HDL of 13, borderline LDL 103, to begin      low-dose Statin. See below.  5. Pulmonary fibrosis with chronic prednisone, no home oxygen needs.  6. Chronic leukocytosis, felt secondary to steroids.  Discharge white      count 11.9.  7. Anemia, suspect chronic disease.  Discharge hemoglobin 10.6.   DISCHARGE MEDICATIONS:  Include:  1. Pravastatin 10 mg once nightly for cholesterol, dispense 31.  2. Aspirin 325 mg once daily (see instead of 81 mg daily).   Other medications are as prior to admission and include prednisone 5 mg  once daily, Boniva 150 mg once monthly, and vitamin D 1000 units daily.   HOSPITAL FOLLOWUP:  1. Will be scheduled with Dr. Gaynell Face of neurology.  The patient to      arrange for appointment with nerve conduction studies and EMG, per      Dr. Gaynell Face instructions.  2. With Dr. Cathlean Cower in 4 weeks, March 10 at 10 o'clock a.m. for      hospital re-check, as well as monitoring of cholesterol and LFTs on      new statin therapy, in the setting of chronic steroid use and      increased risk of myopathy.  LFTs normal February 8, with the      exception of albumin of 1.7   CONDITION ON DISCHARGE:  Still with numbness in right foot and right  hand, as well as less  severe but present in left foot.  Pending PT and  OT eval for recommendations on outpatient therapy.   PROCEDURES:  1. Include a 2-D echo February 9 with normal LVEF of 60% to 65%, no      wall motion abnormality and very mild right ventricular      hypertrophy.  2. Bilateral carotid artery duplex February 10, with no evidence of      significant ICA stenosis.  3. MRI of the brain February 8 with acute right lacunar CVA in the      lateral thalamus and small vessel disease.  MRI of the C-spine      shows spondylosis with osteophytes most notable at C5-6.   HOSPITAL COURSE:  Problem:  1. Right-sided numbness, hand greater than left foot.  The patient is      a 70 year old woman on chronic prednisone for history of pulmonary  fibrosis, who presented to the [facility] emergency room with a 4-      day history of numbness and tingling in her hands and feet, right      greater than left.  An MRI of the brain was ordered to further      evaluate cause of symptoms, as well as a neurology consult and      physical therapy.  Her MRI did show an acute right thalamic infarct      that was very small and not felt likely to be the cause of her      symptoms.  Instead upon previous office record review, and further      neurologic eval, it was felt the patient had a peripheral      neuropathy as previously documented for outpatient records, with a      subsequent organic gait disorder.  Work-up for acute CVA was done      and negative for cause.  The patient and medical management was      pursued with an adjustment increased of her aspirin therapy, as      well as addition of low-dose Statin, due to dyslipidemia with a      very low HDL and borderline LDL.  We are awaiting PT, OT eval and      recommendations for therapy needs, but she is felt stable for      discharge home for outpatient follow-up, to continue workup and      eval of her peripheral polyneuropathy, to include her conduction       studies and EMG.  It is felt that her right hand symptoms are most      consistent with probable carpal tunnel and she may benefit from      eventual release, depending on the results of the studies.  This      has been reviewed with the patient, who agrees and understands.  2. Chronic leukocytosis.  Upon admission, the patient had a markedly      elevated white count, that she was nontoxic, afebrile and further      review showed the patient carried chronic elevated white count.      She had been recently treated for a nearly 3-week course of      fluoroquinolone for a question pneumonia, in addition to her      chronic prednisone therapy.  There was no evidence of active      infection, and she remained afebrile during this hospitalization,      nontoxic without new complaints.  There may have also been a      component of acute phase reactant in her CBC, as lack of further      treatment.  The white count returned to a near normal level of 11.9      on the day of discharge.  I have recommended the patient      discontinue any further antibiotics at this time, continue her home      prednisone as prior with close outpatient follow-up for continued      surveillance with Dr. Lyda Jester, as well as her primary MD, as      previously scheduled.  Given the marked elevation of ferritin at      1243, as well as associated anemia of chronic disease, question of      further outpatient hematology eval need be pursued, but will defer      this to primary  MD.  This concludes discharge summary dictation on      [name].      Valerie A. Asa Lente, MD  Electronically Signed     VAL/MEDQ  D:  11/04/2007  T:  11/05/2007  Job:  [id number]

## 2011-02-06 NOTE — Consult Note (Signed)
NAMEAQUA, UNTHANK NO.:  [id number]   MEDICAL RECORD NO.:  [id number]          PATIENT TYPE:  REC   LOCATION:  FOOT                         FACILITY:  [place]   PHYSICIAN:  Ermalene Searing. Philip Aspen, M.D.DATE OF BIRTH:  23-Apr-1941   DATE OF CONSULTATION:  07/27/2008  DATE OF DISCHARGE:                                 CONSULTATION   PHYSICIAN REQUESTING CONSULTATION:  Heinz Knuckles. Norins, MD   INDICATION FOR CONSULTATION:  Left leg venous stasis ulcer.   HISTORY OF PRESENT ILLNESS:  The patient is a 70 year old Caucasian  woman who on May 29, 2008 accidentally hit her left leg against a  metal door sustaining an abrasion in the distal and lateral aspect of  the left leg, the proximal anterior left leg, and an abrasion on the  lateral aspect of the left hip.  The wound developed an infection  despite topical antibiotic use, so she was seen at an [facility] [place]  on June 18, 2008.  At that visit, she had x-rays, debridement, and  an oral antibiotic was given.  The area initially improved but then  worsened again, so that she was seen for reevaluation in early October  at the [facility] [place].  She was started on Bactroban and a  followup visit with her primary care physician was recommended.  She was  seen by Dr. Linda Hedges who felt that the wound was somewhat deep and  recommended an evaluation in the [place].  She has mild  tenderness on the distal left lower extremity wound, but has not had  fever or chills.  Her past medical history is most significant for  chronic prednisone treatment since February 2009 due to temporal  arteritis.  Currently, she is on 30 mg daily with plans to slowly taper  this dose over the next several months.   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  In February 2009, she also had a stroke that resulted in some numbness  in the right upper extremity and right lower extremity.  At that time,  she had one generalized seizure.  She ambulates with a cane or walker.  She also has lumbar spine degenerative disk disease and is under  consideration for cortisone injections via Dr. Joya Salm.  In 1991, she had  left-sided breast cancer resulting in modified radical mastectomy and  has not had recurrence.  She also has osteoarthritis that led to 2008  left total knee replacement.  She also has a history of pulmonary  fibrosis.   CURRENT MEDICATIONS:  1. Boniva 1 tablet monthly.  2. Prednisone 30 mg daily.  3. Azor 5/20 one tablet daily.  4. Zocor 5 mg daily.  5. Lasix 40 mg daily.  6. Vitamin D 1000 units once daily.  7. Calcium with vitamin D once daily.  8. Aspirin 81 mg daily.  9. Pepcid 20 mg daily.  10.Iron sulfate 65 mg daily.  11.Multivitamin once daily.  12.Vicodin 1 tablet every 4-6 hours p.r.n. pain.  13.Darvocet-N 100 one tablet every 4 hours p.r.n. pain.   ALLERGIES:  No known drug allergies.   PAST SURGICAL HISTORY:  In 1991, left modified radical mastectomy; 2006,  laparoscopic cholecystectomy; 2008, left total knee replacement and an  additional surgery in 2007 that she is not sure about.   FAMILY HISTORY:  A sister of her has had breast cancer.  There is no  family history of diabetes mellitus or colon cancer.   SOCIAL HISTORY:  She has been a widow since July 2009 and was in the  hospital with her husband at the time of his death.  Her husband has one  son from a previous marriage and they have one son together.  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  She has no history of tobacco or  alcohol abuse.   REVIEW OF SYSTEMS:  She generally uses a cane or walker to ambulate.  She has mild-to-moderate chronic low back pain.  She has not had recent  fever or chills.  She has decreased vision on the right side of her  visual field and some residual numbness in the right upper extremity  and  right lower extremity.  She has not had significant anxiety or  depression.   INITIAL PHYSICAL EXAMINATION:  VITAL SIGNS:  Blood pressure 129/82,  pulse 86, respirations 20, and temperature 97.6.  GENERAL:  She is a mildly overweight white female who is in no apparent  distress while sitting upright.  NECK:  Supple without jugular venous distention or carotid bruit.  CHEST:  Clear to auscultation (anteriorly).  HEART:  Regular rate and rhythm without significant murmur or gallop.  ABDOMEN:  Benign.  EXTREMITIES:  She had bilateral 1+ lower extremity edema and multiple  bruises on both legs.  There is a large bruise on the lateral aspect of  the left hip and a very superficial scratch in that area.  There is an  abrasion just distal to the left knee with significant bruising that  measures approximately 2.3 cm x 0.2 cm x 0.1 cm.  There is no  significant eschar or necrotic material.  The wound base is red in color  with red granulation tissue.  There was no foul odor, and there was  scant serosanguineous discharge with an intact periwound integrity.   On the distal and lateral aspect of the left leg, there is an ulcer that  measures 2.0 cm x 1.3 cm x 0.2 cm.  There is partial coverage of the  ulcer by eschar and a moderate amount of necrotic tissue with a yellow  and pale pink wound base, pink granulation tissue, no exposed bone,  tendon, or muscle, no foul odor, a scant amount of serosanguineous  discharge, and intact periwound integrity.  The bilateral pedal pulses  were normal.   IMPRESSION:  Superficial scratch to the left lateral thigh with no signs  of infection, however, a high risk for subsequent infection due to  chronic prednisone treatment.  Abrasion with bruising on the proximal  left leg, again with no current signs of infection.  A venous stasis  ulcer on the distal and lateral aspects of the left leg with a moderate  amount of necrotic tissue and no signs of  infection.   PLAN:  To the left lateral thigh superficial abrasion, we will apply  Iodosorb covered by gauze that will be changed once daily.  The cut on  the proximal aspect of the left leg was treated with  Iodosorb and  absorbent dressing.  The distal left leg venous stasis ulcer was  initially treated with 5% lidocaine gel.  After topical anesthesia was  obtained, a  #15 scalpel was used to gently debride the wound which was done without  significant pain or bleeding.  This area was covered with Iodosorb, then  an absorbent pad and then wrapped with a Profore Lite wrap.  She will  return in approximately 1 week to have the Profore Lite wrap taken down  and reapplied.           ______________________________  Ermalene Searing Philip Aspen, M.D.     DGP/MEDQ  D:  07/27/2008  T:  07/27/2008  Job:  [id number]   cc:   Jill Alexanders, M.D.  Burnett [name], MD, FCCP

## 2011-02-06 NOTE — Assessment & Plan Note (Signed)
[facility]   NAME:  [name], [name] NO.:  [id number]   MEDICAL RECORD NO.:  [id number]      DATE OF BIRTH:  Sep 16, 1941   PHYSICIAN:  Orlando Penner. Sevier, M.D.  VISIT DATE:  08/25/2008                                   OFFICE VISIT   HISTORY:  This 70 year old white female has been followed by for several  wounds on the lower extremities resulting from minor trauma in the face  of chronic high-dose prednisone therapy (for temporal arteritis).  She  has healed all, but one of these which is supramalleolar on the left  lateral ankle location.  This has been treated most recently with daily  application of Iodosorb and she feels that that has been very  beneficial.   PHYSICAL EXAMINATION:  Blood pressure 139/88, pulse 94, respirations 19,  and temperature 98.1.  The wound now measures 1.3 x 1.1 x 0.2 cm, and  has some degree of fibrinopurulent slough in the base.  It is also noted  that there is a slight degree of edema in this distal lower extremity,  which she attributes to having omitted her diuretic therapy during  flights to [place] and back during the Thanksgiving holidays.   IMPRESSION:  Satisfactory progress traumatic stasis ulceration, left  lateral supramalleolar area.   DISPOSITION:  The wound today is selectively debrided of the small  amount of slough in its base and some loose skin right at the wound  margins.  It then looks quite satisfactory.   Because the edema, I think she needs to go back to a compressive wrap  and accordingly today, she will be dressed with silver alginate to the  wound and soft sore pad and that extremity returned to a Profore wrap.   Followup visit will be in 1 week.           ______________________________  Orlando Penner. London Pepper, M.D.     RES/MEDQ  D:  08/25/2008  T:  08/25/2008  Job:  [id number]

## 2011-02-06 NOTE — Assessment & Plan Note (Signed)
[facility]   NAME:  [name], CERIO NO.:  [id number]   MEDICAL RECORD NO.:  [id number]      DATE OF BIRTH:  Sep 07, 1941   PHYSICIAN:  Orlando Penner. Sevier, M.D.  VISIT DATE:  09/15/2008                                   OFFICE VISIT   HISTORY:  This 70 year old white female with steroid dependence who is  being followed for a traumatic ulcer on the distal lateral aspect of her  left calf.  We most recently have been using Prisma and a PRAFO wrap on  this and she has shown very gradual improvement.  Because of her steroid  dependence and other immunomodulatory drugs, it is not expected that she  will heal quickly and I think her progress has been satisfactory.   She reports no pain, drainage, odor, swelling, or systemic symptoms  since her last visit that would appear to reflect upon this wound or its  treatment.   PHYSICAL EXAMINATION:  Blood pressure is 130/80, pulse 98, respirations  18, and temperature 98.1.   The ulcer itself now measures 0.9 x 0.6 cm which is slightly a smaller  and has a good healthy base with exception of one small white speck in  the center.  On investigation, the speck appears to be either fascia or  tendon that is minimally exposed.   IMPRESSION:  Satisfactory progress of chronic wound, lateral distal left  calf.   DISPOSITION:  1. The wound requires no debridement.  2. It will be redressed today with an application of Prisma covered      with a SofSorb pad and the extremity again placed in a PRAFO wrap.   The patient will be seen in 6 or 7 days by the nurse for dressing change  and 12 days by the physician and hopefully then weekly thereafter.           ______________________________  Orlando Penner. London Pepper, M.D.     RES/MEDQ  D:  09/15/2008  T:  09/16/2008  Job:  [id number]

## 2011-02-06 NOTE — H&P (Signed)
[name], [name] NO.:  [id number]   MEDICAL RECORD NO.:  [id number]          PATIENT TYPE:  INP   LOCATION:  [place]                         FACILITY:  [facility]   PHYSICIAN:  Cindie Laroche, MD      DATE OF BIRTH:  [birth year]   DATE OF ADMISSION:  11/01/2007  DATE OF DISCHARGE:                              HISTORY & PHYSICAL   PRIMARY CARE PHYSICIAN:  Dr. Cathlean Cower   CHIEF COMPLAINT:  Right upper and lower extremity numbness.   HISTORY OF PRESENTING ILLNESS:  [name] is a 70 year old Caucasian  female with new onset numbness of right hand in three fingers only and  lower extremity in the foot for 1 day.  She has this numbness persistent  since yesterday, it has not gone away.  No aggravating or alleviating  factor.  It has not progressed to involve any further area of her body.  Her numbness is persistent in the first two fingers and thumb on the  right upper extremity and entire foot of right lower extremity.  She has  a recent history of pneumonia, sinusitis, stomach virus, and flu in last  1 month treated with Levaquin for 10 days and Avelox for 9 days now.  She also complained of double vision since Thursday.  She has a slight  headache over right side of the forehead also.  Other complaints include  chills and shortness of breath secondary to pulmonary fibrosis.   PAST MEDICAL HISTORY:  1. Pulmonary fibrosis.  2. Persistent leukocytosis.  3. Cholecystectomy.  4. Mastectomy.  5. Osteoporosis.  6. Left total knee replacement.  7. Breast cancer.   SOCIAL HISTORY:  No recent alcohol, drug or tobacco use.  The patient is  married, retired, and taking care of her chronically-ill husband.   FAMILY HISTORY:  Positive for cancer, CVA, osteoarthritis, and  Alzheimer's disease.   ALLERGIES:  No known drug allergies.   MEDICATIONS:  1. Prednisone 5 mg p.o. daily.  2. Aspirin 81 mg p.o. daily.  3. Boniva 150 mg p.o. daily.  4. Avelox 400 mg p.o. daily  since last 9 days.  5. Vitamin D 1000 units p.o. daily.   PHYSICAL EXAMINATION:  VITAL SIGNS:  Temperature 98 Fahrenheit, blood  pressure 132/61, respirations 20, pulse 110, 94% saturation on room air  oxygen.  GENERAL EXAM:  Alert and oriented x3, not in acute distress.  RESPIRATORY SYSTEM:  Clear to auscultation bilaterally.  No wheezing,  rhonchi, crackles.  CARDIOVASCULAR:  S1, S2, regular.  No murmur, rub, gallop.  ABDOMEN:  Nontender, nondistended.  Scar present.  Bowel sounds present.  No organomegaly.  EXTREMITIES:  No clubbing, cyanosis, or edema.  Pulses palpable in all  four extremities.  HEAD:  Normocephalic, nontraumatic.  EYES:  Pupils equally reactive to light and accommodation.  Extraocular  muscles intact.  OROPHARYNX:  Mucosa moist, no thrush noted.  NECK:  No JVD, thyromegaly or lymphadenopathy noted.  SKIN:  No rash or bruises.  NEUROLOGICAL:  Shows all cranial nerves are intact.  Muscular strengths  are intact including right-sided interossei and lumbrical muscles  are  intact.  Sensations are diminished in three fingers on the right hand  and the foot of right lower extremity.  Reflexes are intact.   ASSESSMENT AND PLAN:  1. Right upper extremity hand and right foot numbness, diplopia,      slight headache.  Differential includes stroke, TIA versus steroid      use.  2. Pulmonary fibrosis.  3. Persistent leukocytosis.  4. Left total knee replacement.   PLAN:  1. Will admit the patient to telemetry bed under Dr. Gwendolyn Grant.      Will put her regular diet.  Will get CBC with differential, CMP,      magnesium phosphate in the morning.  Will get MRI of brain with and      without contrast in the morning to further characterize her      problem.  2. Will get neurology and physical therapy consult in the morning.  3. Will continue prednisone, aspirin and vitamin D at home doses.  4. Will get neurologic check q.4h.  5. The patient's code status is full  code.      Cindie Laroche, MD  Electronically Signed     TVP/MEDQ  D:  11/02/2007  T:  11/02/2007  Job:  [id number]   cc:   Biagio Borg, MD  [address] [address]   Valerie A. Asa Lente, [place]  [place], [place] [address]

## 2011-02-06 NOTE — Discharge Summary (Signed)
NAMEELZINA, HILTNER NO.:  [id number]   MEDICAL RECORD NO.:  [id number]          PATIENT TYPE:  INP   LOCATION:  [place]:  [facility]   PHYSICIAN:  Valerie A. Asa Lente, MDDATE OF BIRTH:  1941/01/01   DATE OF ADMISSION:  11/15/2007  DATE OF DISCHARGE:  11/19/2007                               DISCHARGE SUMMARY   DISCHARGE DIAGNOSES:  1. Presumed temporal arteritis with right-sided vision disturbance,      headache, and jaw claudication, improved on steroid therapy. Status      post biopsy February 23. Pathology not definitive for TA. See      details below.  2. Steroid-induced type 2 diabetes. Continue Metformin therapy. A1C of      5.3. Outpatient followup at primary M.D.  3. History of right thalamic infarct, early February 2009. Continue      medical management.  4. History of polymyalgia rheumatica with anemia. Discharge sed rate      56. Hemoglobin 11.  5. History of pulmonary fibrosis. Know __________. Outpatient followup      with Dr. Joya Gaskins p.r.n.  6. History of organic gait disorder with peripheral polyneuropathy,      acute on chronic.  7. Question right carpal tunnel syndrome, for outpatient nerve      conduction studies.  8. Dyslipidemia, to continue statin therapy, outpatient followup.  9. History of breast cancer, carcinoma in situ, status post left      mastectomy.  10.History of left total knee replacement.  11.History of cholecystectomy.  12.History of tonsillectomy.  13.Chronic leukocytosis, now exacerbated by steroids. White count      prior to discharge 16.9 on February 21st.  14.History of positive PTTLA titer February 9th, outpatient followup      with primary M.D. as described.   DISCHARGE MEDICATIONS:  1. Prednisone 60 mg daily until further instruction with Dr. [name] of      neurology.  2. Metformin 500 mg b.i.d. or at the direction of Dr. Linda Hedges, primary      M.D.   OTHER MEDICATIONS:  As prior to  admission and include:  1. Aspirin 81 mg once daily.  2. Boniva 150 mg once monthly.  3. Pravastatin 10 mg once daily.  4. Vitamin D 1,000 units daily.  5. Calcium supplement daily.   CONSULT THIS HOSPITALIZATION:  With [facility], Dr. [name] and  [facility].   PROCEDURES:  Temporal artery biopsy February 23rd by Dr. Dalbert Batman. Also CT  angio of the head and neck as well as noncontrasted head CT. Please see  full report for these details.   Hospital followup will be with primary care physician, Dr. Adella Hare, scheduled for Tuesday, March 10th at 11:10 a.m. Also with  Lurline Hare, M.D. Neurology with Dr. [name], office with  call for appointment. No scheduled followup with surgery, Dr. Dalbert Batman,  needed but may be on a p.r.n. basis as well as p.r.n. with Dr. Joya Gaskins as  prior to admission for pulmonary admission and Dr. Ralene Ok p.r.n.  regarding anemia issues.   CONDITION ON DISCHARGE:  Medically  improved with decreased of her usual  symptoms. No jaw claudication but still with complaints of paresthesias  on the right side.   HOSPITAL COURSE BY PROBLEM:  1. Presumed temporal arteritis. The patient is a 70 year old woman      with recent hospitalization in the last 2-3 weeks for similar right-      sided symptoms as well as an incidental acute right thalamic stroke      who has persisted with right-sided paresthesias since discharge and      not yet had outpatient neuro followup. Returned to the emergency      room on the day of admission due to vision loss on the right side      associated with headache. Please see full history and physical for      full details. Neuro was consulted due to lack of acute stroke      evidence and symptoms concerning for temporal arteritis, especially      with a sed rate of 97. Due to concern over this diagnosis, she was      placed on high-dose IV Solu-Medrol for three days and underwent a      temporal artery  biopsy under the direction of general surgery on      February 23rd. With treatment of high-dose IV steroids, the      patient's symptoms of headache, jaw claudication, and vision have      dramatically improved, and her sed rate has also been reduced to      56. However, per Dr. [name], pathology of the temporal artery      biopsy is not definitive for temporal arteritis, but given her      improvement in symptoms we continued with the presumptive diagnosis      and continued high-dose steroid treatment with 60 mg prednisone      daily until further followup with neuro as an outpatient basis.      This will be arranged by Dr. [name], and the patient is aware of      this fact. No formal followup with surgery is needed, and continue      wound care for Steri-Strips at the site of biopsy per routine.  2. Steroid-induced diabetes. On high-dose steroids, the patient's CBGs      range greater than 200. She has been on chronic prednisone prior to      admission but at a low dose of 5 mg daily. On the high dose of Solu-      Medrol and now persisting with high-dose prednisone at 60 mg daily      despite a normal A1C of 5.3 she has been initiated on Metformin      therapy 500 mg b.i.d. and will follow up with her primary M.D., now      to be Dr. Linda Hedges in the next two weeks. Marked ________  as      described. He will also manage her other chronic medical issues as      the patient has made plans to change from her previous primary M.D.  3. Other chronic medical issues. The patient's other medical issues      are as listed above, and there has been no change in her treatment      or diagnoses except as listed above. There have been forty minutes      spent on discharge planning. The patient is stable for discharge      home.  Valerie A. Asa Lente, MD  Electronically Signed     VAL/MEDQ  D:  11/19/2007  T:  11/19/2007  Job:  [id number]   cc:   Biagio Borg, MD  [address]  [place] [address]   [place] M. Dalbert Batman, M.D.  [id number] [place] [address]   C. Floyde Parkins, M.D.  Fax: Christine Norins, MD  [address]  [place] [address]

## 2011-02-06 NOTE — Discharge Summary (Signed)
NAMEROTEM, LIJEWSKI NO.:  [id number]   MEDICAL RECORD NO.:  [id number]          PATIENT TYPE:  INP   LOCATION:  [place]                         FACILITY:  [facility]   PHYSICIAN:  Cynda Familia, M.D.DATE OF BIRTH:  [birth year]   DATE OF ADMISSION:  04/04/2007  DATE OF DISCHARGE:                               DISCHARGE SUMMARY   __________   HOSPITAL COURSE:  The patient tolerated the procedure well.  On postop  day #1, vital signs were stable, afebrile.  Neurovascular status intact.  Leg and ankle could move without difficulty.  Internal Medicine consult  with [place] were obtained secondary to the patient's history of  pulmonary fibrosis.  On postop day #2, she was feeling a little better.  Vital signs were stable.  She was afebrile.  Neurovascularly intact to  the leg.  Lungs clear.  Heart sounds normal.  Bowel sounds sluggish.  Incision was benign with mild swelling.  Her fluids were discontinued.  Therapy was __________. On postop day #3, she was feeling okay, and she  was not sure if she was able to go home as she was having a little  difficulty making progress in therapy.  Vital signs were stable.  She  was afebrile.  Temperature was to a max of 101.7 last evening.  Hemoglobin was 10.5, hematocrit 30, WBC 12.8, glucose 107.  She had  scattered crackles in her lungs.  Heart sounds were normal.  Bowel  sounds were sluggish.  Neurovascular status intact to the leg.  Calves  were negative.  Her wound had some mild redness around the wound and  this appeared to be bruising and stretch redness from the surgery, but  she was put on p.o. Keflex prophylactically with significant reduction.  A rehab consult was obtained, but by this time she had another round of  therapy and they felt she would be able to go home and plans were made  for discharge home later this week.  On April 08, 2007, she was feeling  okay.  Vital signs were stable.  Temperature 100.4, O2 97 on  room air.  Lungs now were clear after working hard on incentive spirometry.  Heart  sounds normal.  Bowel sounds active.  Neurovascular status intact.  Leg  calves were negative.  The wound with peri-incisional redness was  better, and on April 11, 2007, she was feeling better, vital sings  stable.  Afebrile.  O2 97% on room air.  CBC shows a white count just  trace elevated at 10.7, hemoglobin 9.3, hematocrit 26.8.  Lungs clear.  Heart sounds normal.  Bowel sounds active.  Calves negative.  Wound  looks much better this morning, and bruising accounted for the redness.  Hence, the patient is subsequently discharged home for follow up in the  office.   CONDITION ON DISCHARGE:  Improved.   DISCHARGE MEDICATIONS:  1. Percocet 1-2 q.4-6h p.r.n. pain.  2. Robaxin 500 one p.o. q.8 h p.r.n. spasm.  3. Trinsicon one b.i.d. for anemia.  4. Lovenox one tablet daily 6 .a.m. and 6 p.m.  5. Keflex 500 mg p.o.  q.i.d.   DISCHARGE INSTRUCTIONS:  She is to keep the wound dry for three weeks.  She is not to take Advil __________ anti-inflammatories until off the  Lovenox.  She is to do her therapy each day, call the office for recheck  in two weeks or call sooner p.r.n. problems.      Judith Part. Chabon, P.A.    ______________________________  Cynda Familia, M.D.    SJC/MEDQ  D:  04/09/2007  T:  04/09/2007  Job:  [id number]

## 2011-02-06 NOTE — Procedures (Signed)
REFERRING PHYSICIAN:  Truddie Hidden, M.D.   CLINICAL HISTORY:  This is a 70 year old patient being found on the  floor incontinent.  EEG is done to evaluate for seizures.   MEDICATIONS:  Solu-Medrol, aspirin, Pravastatin, vitamin D.   This is a 17-channel portable EEG recorded with the patient described as  being lethargic.   No clear background awake rhythm is noted.  Background consists of 6-7  hertz theta which is diffuse and symmetric.  Intermittent 3-4 hertz  delta slowing is seen bilaterally predominantly in the frontal head  regions.  No definite epileptiform activity or spikes are noted.  Definite sleep stages are not seen.  The length of this EEG is 21.9  minutes.   Technical component is average.   EKG tracing reveals a regular sinus rhythm.   Hyperventilation and photic stimulation were note performed, this being  a portable study.   IMPRESSION:  This electroencephalogram is abnormal due to the presence  of moderate bi-hemispheric slowing which is a nonspecific finding seen  in a variety of toxic metabolic, infectious, and post seizure  etiologies.           ______________________________  Kathie Rhodes. Leonie Man, MD     [id number]  D:  11/20/2007 18:37:26  T:  11/21/2007 12:52:20  Job #:  [id number]

## 2011-02-06 NOTE — Op Note (Signed)
NAMESCOTTI, KIDDOO NO.:  [id number]   MEDICAL RECORD NO.:  [id number]          PATIENT TYPE:  AMB   LOCATION:  [place]                          FACILITY:  [facility]   PHYSICIAN:  Daryll Brod, M.D.       DATE OF BIRTH:  02/27/1941   DATE OF PROCEDURE:  02/10/2008  DATE OF DISCHARGE:                               OPERATIVE REPORT   PREOPERATIVE DIAGNOSIS:  Carpal tunnel syndrome, pronator syndrome right  arm.   POSTOPERATIVE DIAGNOSIS:  Carpal tunnel syndrome, pronator syndrome  right arm.   OPERATION:  Decompression of the right median nerve at the carpal canal  and at the pronator.   SURGEON:  Daryll Brod, MD   ASSISTANT:  Barbarann Ehlers.   ANESTHESIA:  IV regional.   ANESTHESIOLOGIST:  Cheral Marker. Ola Spurr, MD   HISTORY:  The patient is a 70 year old female with history of median  nerve compression.  EMG nerve conductions reveal changes of both the  wrist and in the forearm.  She is desirous of proceeding to have these  released and that conservative treatment has not afforded her  significant relief of symptoms.  Questions have been encouraged and  answered.  She is aware of risks and complications including infection,  recurrence injury to arteries, nerves, tendons, incomplete relief of  symptoms, and dystrophy.  In the preoperative area, the patient is seen.  Questions again encouraged and answered to her satisfaction.  The  extremity marked by both the patient and surgeon.  Antibiotic given.   PROCEDURE:  The patient was brought to the operating room where an upper  arm IV regional anesthetic was carried out without difficulty under the  direction of Dr. Ola Spurr.  She was prepped using DuraPrep, supine  position with the right arm free.  A longitudinal incision was made in  the palm and carried down through the subcutaneous tissue.  Bleeders  were electrocauterized.  Palmar fascia was split, superficial palmar  arch identified, the flexor tendon to the  ring and little finger  identified to the ulnar side of the median nerve.  Carpal retinaculum  was incised with sharp dissection.  A right-angle and Sewall retractor  were placed between skin and the forearm fascia.  The fascia was  released for approximately a centimeter and half proximal to the wrist  crease under direct vision.  The canal was explored.  Compression to the  nerve was apparent.  No further lesions were identified.  The wound was  irrigated.  The skin closed with interrupted 5-0 Vicryl Rapide sutures.  A separate incision was then made in the antecubital fossa and carried  down through the subcutaneous tissue.  Bleeders were again  electrocauterized.  The cephalic vein was identified.  The dissection  carried down and the lacertus fibrosus was identified.  This was  transected.  It was extremely thick.  The brachial artery was identified  along with the median nerve.  This was identified proximally.  No areas  of compression were noted after placement of retractors.  The dissection  was then carried distally.  Two bellies of the  pronator were identified.  A very thick fibrous lesion was present over the nerve.  This was  released.  The nerve was traced distally into the superficialis.  No  further lesions were identified.  The entire area was visualized, taking  care to protect all branches of the median nerve and complete release of  the median nerve in the antecubital fossa of the wound was irrigated.  The subcutaneous tissue was closed with interrupted 4-0 Vicryl sutures.  The skin was closed with interrupted 4-0 Vicryl Rapide sutures.  A  sterile  compressive dressing and splint to the wrist applied.  The patient  tolerated the procedure well and was taken to the recovery observation  in satisfactory condition.  She will be discharged home to return to the  [place] in 1 week on Vicodin.           ______________________________  Daryll Brod,  M.D.     GK/MEDQ  D:  02/10/2008  T:  02/11/2008  Job:  [id number]   cc:   Biagio Borg, MD

## 2011-02-06 NOTE — Op Note (Signed)
[name], [name] NO.:  [id number]   MEDICAL RECORD NO.:  [id number]          PATIENT TYPE:  INP   LOCATION:  21                         FACILITY:  [place]:  Edsel Petrin. Dalbert Batman, M.D.DATE OF BIRTH:  08/13/1941   DATE OF PROCEDURE:  DATE OF DISCHARGE:                               OPERATIVE REPORT   DATE OF SURGERY:  11/17/2007   PREOPERATIVE DIAGNOSIS:  Temporal arteritis.   POSTOPERATIVE DIAGNOSIS:  Temporal arteritis.   OPERATION PERFORMED:  Right temporal artery biopsy.   SURGEON:  Edsel Petrin. Dalbert Batman, M.D.   OPERATIVE INDICATIONS:  This is a 70 year old woman who was admitted to  this hospital on February 21 with headache and diminished vision in the  right eye.  She had had some malaise, fever and chills.  She had a  thalamic stroke event on November 02, 2007, and had gone home.  She was  on aspirin and Pravastatin.  She was readmitted because of the headache  and visual disturbance and has been evaluated by internal medicine as  well as neurology.  They have felt that she might have temporal  arteritis and have asked [place] to perform a temporal artery biopsy.  I have  counseled the patient regarding this, including risks and complications,  and she understood and agreed. She was brought to the operating room to  have this done during her inpatient hospitalization.   OPERATIVE TECHNIQUE:  The patient was brought to room 11 at [facility], placed supine on the operating table with her head turned to  the left.  She was monitored and sedated by the anesthesia department.  They shaved the hair in front of the right ear and above the ear.  We  used the Doppler to listen to the course of the right temporal artery,  and this was marked with a marking pen.  Cotton was placed in the  external auditory canal.  The right ear and right face was then prepped  and draped in a sterile fashion.  Xylocaine 1% with epinephrine was used  as a local  infiltration anesthetic.  A 4 cm incision was made overlying  the right temporal artery.  Dissection was carried down through the  subcutaneous tissue, and the deep fascia was separated.  Self-retaining  retractors were placed.  I dissected out temporal artery and fairly  easily dissected about a 2.5 cm length of this away from the surrounding  tissues and isolated this very easily.  It was clamped proximally and  distally with mosquito clamps, divided and sent for routine histology.  The proximal and distal ends of the temporal artery were ligated with 3-  0 Vicryl ties.  The wound was irrigated with saline.  The subcutaneous  tissue was closed with interrupted sutures of 3-0 Vicryl.  The skin was  closed with a running subcuticular suture of 4-0 Monocryl and Steri-  strips.  Clean bandages were placed, and the patient was taken to the  recovery room in stable condition.  Estimated blood loss  was about 2 or  3 cc.   COMPLICATIONS:  None.   Sponge and instrument counts were correct.      Edsel Petrin. Dalbert Batman, M.D.  Electronically Signed     HMI/MEDQ  D:  11/17/2007  T:  11/17/2007  Job:  [id number]   cc:   Biagio Borg, MD  [address] [address]   Valerie A. Asa Lente, [place], [place] [address]   C. Floyde Parkins, M.D.  Fax: [phone]   Jeanie Cooks, M.D.  Fax: [phone]

## 2011-02-06 NOTE — Assessment & Plan Note (Signed)
[facility]   NAME:  [name], [name] NO.:  [id number]   MEDICAL RECORD NO.:  [id number]      DATE OF BIRTH:  1940-09-27   PHYSICIAN:  Ermalene Searing. Philip Aspen, M.D.     VISIT DATE:                                   OFFICE VISIT   SUBJECTIVE:  The patient is a 70 year old Caucasian woman who has had a  persistent traumatic wound to the distal and lateral left lower  extremity that has not completed healing, likely due to chronic  prednisone treatment for temporal arteritis.  At her last visit on  July 27, 2008, she had Iodosorb with absorbent pads covered by a  Profore Lite applied to the left lower extremity.  Unfortunately, later  that day, her wrap got wet, that was removed and she has been using  Iodosorb with gauze to the wounds.  She has not had significant pain,  fever, or chills.   OBJECTIVE:  VITAL SIGNS:  Blood pressure 142/83, pulse 72, respirations  20, and temperature 98.2.   The condition of the wounds is as follows:  Wound #1:  Located on the distal lateral left leg measures 1.9 cm x 1.7  cm x 0.2 cm.  This wound does not have significant eschar but has  minimal necrotic material and a pale pink wound base.  There was pink  granulation tissue, no exposed bone, tendon or muscle, no foul odor, a  scant amount of yellow drainage and intact periwound integrity.  Wound #2:  Located on the proximal most aspect of the left leg overlying  the left knee and has resolved.  Wound #3:  Located on the lateral aspect of left thigh has resolved.   ASSESSMENT:  No significant interval change in left lateral leg  traumatic ulcer.   PLAN:  After topical anesthesia was obtained via 5% lidocaine ointment,  a #15 scalpel was used to debride the small amount of necrotic tissue  from the wound #1.  This was done without significant pain or bleeding.  We will continue treatment with Iodosorb covered by  SofSorb, covered by  a Profore Lite wrap.  She  will continue Iodosorb with gauze to the left  lateral thigh and left proximal leg areas until the entire epithelium is  intact.  She is to have a nurse visit on Friday, August 13, 2008, at  which time her Profore Lite will be removed and she will be switched  temporarily to an Ace wrap as she is going on a trip to [place] to  visit family members for the Thanksgiving holiday.  After return, she  can have the Profore Lite wrap replaced.  She should have a physician  for reevaluation in approximately 3 weeks.           ______________________________  Ermalene Searing Philip Aspen, M.D.     DGP/MEDQ  D:  08/03/2008  T:  08/03/2008  Job:  [id number]

## 2011-02-06 NOTE — Op Note (Signed)
NAMEGURKIRAT, RASPANTI NO.:  [id number]   MEDICAL RECORD NO.:  [id number]          PATIENT TYPE:  INP   LOCATION:  0002                         FACILITY:  [facility]   PHYSICIAN:  Cynda Familia, M.D.DATE OF BIRTH:  [birth year]   DATE OF PROCEDURE:  04/04/2007  DATE OF DISCHARGE:                               OPERATIVE REPORT   PREOPERATIVE DIAGNOSIS:  Left knee end stage arthritis.   POSTOPERATIVE DIAGNOSIS:  Left knee end stage arthritis.   PROCEDURE:  Left total knee arthroplasty.   SURGEON:  Cynda Familia, M.D.   ASSISTANT:  Judith Part. Chabon, P.A.-C.   ANESTHESIA:  Spinal with monitored anesthesia care.   ESTIMATED BLOOD LOSS:  Less than 100 mL.   DRAINS:  One medium Hemovac.   COMPLICATIONS:  None.   TOURNIQUET TIME:  120 minutes at 325 mmHg.   OPERATIVE IMPLANTS:  [place].  Sigma.  Size 3 femur,  size 3 tibia, 10 posterior stabilized rotating platform tibial insert,  32 mm all polyethylene patella.  All cemented.   OPERATIVE DETAILS:  The patient was counseled in the holding, the  correct side was identified, and an IV was started.  She was taken to  the operating room and antibiotics were given.  Spinal anesthetic was  administered.  A Foley catheter was placed utilizing sterile technique  by the OR circulating nurse.  The extremity was examined.  She had a 5  degree flexion contracture, flexion to 110 degrees.  She was elevated,  prepped with DuraPrep, and draped in a sterile fashion.  Exsanguinated  with an Esmarch and the tourniquet was inflated to 325 mmHg due to the  size of her leg to correlate with the blood pressure.   A straight midline incision was made through the skin and subcutaneous  tissue.  Medial and lateral soft tissue flaps were developed.  A medial  parapatellar arthrotomy was performed and a medial soft tissue release  was done.  The knee was flexed and there were end stage arthritis  changes  bone against bone.  The cruciate ligaments were resected.  A  starter hole was made in the distal femur.  The canal was irrigated  until the effluent was cleared.  I chose a 5 degree valgus cut with a 10  mm cut off the distal femur due to her flexion contracture.  The distal  femur was found be a size 3.  Rotation marks were set.  The  intramedullary rod was gently placed.  This was cut to fit a size #3.  Medial and lateral menisci were removed under direct visualization.  Geniculate vessels were coagulated.  Posterior neurovascular structures  were thought off and protected throughout entire case.  The tibia  eminence was resected, the proximal tibia osteophytes were removed, the  tibia was found to be a size 3.  The central aspect was identified,  reamer, step reamer, irrigated, intramedullary rod was gently placed.  I  chose a  10 mm cut based upon the least deficient side which was the  lateral side with a 0 degrees slope.  Posterior medial and posterior  lateral femoral osteophytes were removed under direct visualization.  At  this time, our flexion extension blocks for 10 insert, well balanced  flexion/extension gaps at 0 and 90 degrees of flexion.  The tibial base  plate was applied, rotation coverage was set, reamer and punch was  performed, and the femoral box cut.  At this time, a size 3 femur, size  3 tibia, 10 insert with excellent range of motion, soft tissue balance  and alignment, stability in varus and valgus stress, 0 to 90 degrees.  The patella was found to be a size 32.  The appropriate amount of bone  was resected and the patella was cut to fit a size #32.  Locking holes  were made.  The patellar track was anatomic with the patella trial.  The  trials were removed.  The knee was irrigated with pulsatile lavage.  Utilizing Moder cement technique, all components were cemented into  place, size 3 tibia, size 3 femur, 10 trial insert, and a 32 patella.  After the cement  had cured with the 10 insert, well balanced in flexion  and extension, patellofemoral tracking was anatomic.  The trial was  removed, excess cement was removed, the knee was again irrigated and a  final 10 mm posterior stabilized rotating platform tibial insert was  implanted.  We had a well balanced knee in flexion and extension, full  extension, flexed to 120 limited only by the drapes and the back of her  thigh.  The patellar track was anatomic.   A medium Hemovac drain was placed.  Sequential closure in layers was  done, arthrotomy with Vicryl, subcu with Vicryl, skin with a  subcuticular Monocryl suture.  The drain was hooked to suction.  A  sterile dressing was applied to the knee.  The tourniquet was deflated.  Normal circulation of the foot and ankle at the end of the case.  She  tolerated the procedure well.  There were no complications or problems.  The patient was taken from the operating room to the PACU in stable  condition.   The help with surgical decision making and retraction, Mr. Molli Barrows,  PA-C, assistance was needed throughout the entire case.           ______________________________  Cynda Familia, M.D.     RAC/MEDQ  D:  04/04/2007  T:  04/05/2007  Job:  [id number]

## 2011-02-06 NOTE — Consult Note (Signed)
NAMETYRONDA, AVITABILE NO.:  [id number]   MEDICAL RECORD NO.:  [id number]          PATIENT TYPE:  INP   LOCATION:  [id number]                         FACILITY:  [facility]   PHYSICIAN:  Earnstine Regal, MD      DATE OF BIRTH:  1941-04-08   DATE OF CONSULTATION:  11/16/2007  DATE OF DISCHARGE:                                 CONSULTATION   REASON FOR CONSULTATION:  Rule out temporal arteritis.   BRIEF HISTORY:  The patient is a 70 year old white female admitted on  the medical service at [facility] with headache and visual  loss.  Neurosurgery consultation suspects temporal arteritis.  The  patient has been started on a steroid treatment intravenously.  Neurology desires temporal artery biopsy within the next 48 hours.   PAST MEDICAL HISTORY:  1. Right thalamic infarct in February 2009.  2. Pulmonary fibrosis.  3. Total knee replacement.   MEDICATIONS:  1. Prednisone.  2. Pravastatin.  3. Aspirin.   ALLERGIES:  None known.   PHYSICAL EXAMINATION:  GENERAL:  A 70 year old white female in no acute  distress on ward [address], [place]:  Afebrile.  Vital signs stable.  HEENT:  Normocephalic.  Sclerae clear.  Conjunctiva clear.  Dentition  fair.  Mucous membranes moist.  Voice normal.  No prior head or neck  surgery.   LABORATORY DATA:  White count 16.9, hemoglobin 11.0, platelet count  618,000.  Electrolytes are normal.  Prothrombin time 14.9, INR 1.1.   IMPRESSION:  Rule out temporal arteritis.   PLAN:  The patient will be made n.p.o. after midnight.  The operating  room was notified of our intention to perform this procedure under local  anesthesia or local anesthesia with sedation on November 17, 2007.  Dr.  Fanny Skates will perform the procedure.  Orders are written.  Permit  is requested.   I discussed with the patient and her family the indications for the  procedure.  I described the procedure.  I described the location of  the  surgical wound.  She understands and agrees to proceed.      Earnstine Regal, MD  Electronically Signed     TMG/MEDQ  D:  11/16/2007  T:  11/17/2007  Job:  [id number]   cc:   Biagio Borg, MD  [address] [address]   C. Floyde Parkins, M.D.  Fax: [phone]   Edsel Petrin. Dalbert Batman, M.D.  [id number], [place]  [place] [address]

## 2011-02-07 ENCOUNTER — Telehealth: Payer: Self-pay | Admitting: Internal Medicine

## 2011-02-07 NOTE — Telephone Encounter (Signed)
Left detailed vm for pt.

## 2011-02-07 NOTE — Progress Notes (Signed)
  Subjective:    Patient ID: [name], female    DOB: June 09, 1941, 70 y.o.   MRN: [id number]  HPI [name] is well known to the practice. 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 She has also had spinal surgery.   She was at her neurolgoist who noticed some swelling of the small joints. He referred her to Dr. Amil Amen for rhematology: labs were consistent with RA with RF elevated at 126 and CCP also elevated. Metabolic panel revealed a Cr 1.9. He subsequently recommended that she see nephrology. She has decided to seek advice before making an appointment with another specialist.  She reports that she now has very little discomfort involving her joints or her back. She is actually feeling pretty well.   I have reviewed the patient's medical history in detail and updated the computerized patient record.    Review of Systems Review of Systems  Constitutional:  Negative for fever, chills, activity change and unexpected weight change.  HENT:  Negative for hearing loss, ear pain, congestion, neck stiffness and postnasal drip.   Eyes: Negative for pain, discharge and visual disturbance with chronic loss of vision OD.  Respiratory: Negative for chest tightness and wheezing.   Cardiovascular: Negative for chest pain and palpitations.       [No decreased exercise tolerance Gastrointestinal: [No change in bowel habit. No bloating or gas. No reflux or indigestion Genitourinary: Negative for urgency, frequency, flank pain and difficulty urinating.  Musculoskeletal: Negative for myalgias, back pain, arthralgias and gait problem. Denies swelling or redness of the small joints or medium joints.  Neurological: Negative for dizziness, tremors, weakness and headaches.  Hematological: Negative for adenopathy.  Psychiatric/Behavioral: Negative for behavioral problems and dysphoric mood.       Objective:   Physical Exam Overweight white woman in no  acute distress. HEENT - nl, C&S clear Cor - RRR Pul - no increased work of breathing, no rales or wheezes MSK - no synovial thickening, redness or tenderness to the small joints of the hand.        Assessment & Plan:  1. Chronic renal insufficiency - chart reviewed: creatinine has risen from 0.7 in '08 to 1.9. This has been a progressive decline in function. Although she was treated for hyperglycemia during hospitalization she has had normal range A1C testing. Her chronic kidney disease is due to multiple factors including hypertension and very likely chronic vasculitis.  Plan - 24 hr Urine for creatinine clearance and total protein            Renal u/s            SPEP and UPEP            Control of risk factors            Once initial work-up is completed if she has rapid progression of disease will refer to nephrology

## 2011-02-07 NOTE — Telephone Encounter (Signed)
Please call patient. Sed Rate 26 - good. Please send a copy to Dr. [name]

## 2011-02-08 ENCOUNTER — Other Ambulatory Visit: Payer: Medicare Other

## 2011-02-08 DIAGNOSIS — N189 Chronic kidney disease, unspecified: Secondary | ICD-10-CM

## 2011-02-09 ENCOUNTER — Telehealth: Payer: Self-pay | Admitting: *Deleted

## 2011-02-09 ENCOUNTER — Other Ambulatory Visit: Payer: Self-pay | Admitting: *Deleted

## 2011-02-09 LAB — CREATININE CLEARANCE, URINE, 24 HOUR
Creatinine Clearance: 38 mL/min — ABNORMAL LOW (ref 75–115)
Creatinine, 24H Ur: 1245 mg/d (ref 700–1800)
Creatinine: 2.28 mg/dL — ABNORMAL HIGH (ref 0.40–1.20)

## 2011-02-09 MED ORDER — TRIAMCINOLONE ACETONIDE 0.1 % EX CREA: TOPICAL_CREAM | Freq: Two times a day (BID) | CUTANEOUS | Status: DC

## 2011-02-09 NOTE — Assessment & Plan Note (Signed)
[place]                               PULMONARY OFFICE NOTE   NAME:[name], Ratts                     MRN:          [id number]  DATE:07/29/2006                            DOB:          [birth year]    This is a 70 year old white female with a history of chronic obstructive  lung disease with pulmonary fibrosis, idiopathic and bronchiectasis. She is  now back on pulsed prednisone since October doing well, she is now off the  Asmanex. Her level of cough and dyspnea are improved, she is having less  shortness of breath, decreased chest congestion.   PHYSICAL EXAMINATION:  VITAL SIGNS:  Temperature 98, blood pressure 124/72,  pulse 80, saturation 100% room air.  CHEST:  Showed dry rales at the bases otherwise clear.  CARDIAC:  Showed a regular rate and rhythm without S3, normal S1, S2.  ABDOMEN:  Soft, nontender.  EXTREMITIES:  Showed no edema or clubbing.  SKIN:  Clear.   IMPRESSION:  Pulmonary fibrosis with associated bronchiectasis, steroid  responsive.   PLAN:  The patient is to reduce prednisone down to 10 mg a day and hold at  that level. We will repeat a full set of pulmonary functions and CT scanning  of the chest and see the patient back in return followup in 2 months.     Burnett [name], MD, [facility]  Electronically Signed    PEW/MedQ  DD: 07/29/2006  DT: 07/30/2006  Job #: [id number]   cc:   Biagio Borg, MD

## 2011-02-09 NOTE — Assessment & Plan Note (Signed)
[facility]                               PULMONARY OFFICE NOTE   NAME:[name], [name]                     MRN:          [id number]  DATE:05/20/2006                            DOB:          March 02, 1941    HISTORY OF PRESENT ILLNESS:  The patient is a 70 year old white female,  patient of Dr. Joya [name].  Has a known history of bronchiectasis, idiopathic  pulmonary fibrosis presents for an acute office visit.  The patient  complains of a one-week history of productive cough with thick yellow sputum  and body aches.  She denies any hemoptysis, orthopnea, PND or leg swelling.  She is maintained on Asmanex daily.   PHYSICAL EXAMINATION:  GENERAL:  The patient is a pleasant female in no  acute distress.  VITAL SIGNS:  She is afebrile with stable vital signs.  O2 saturation is 96%  on room air.  HEENT:  Unremarkable.  NECK:  Supple without adenopathy.  No JVD.  LUNGS:  Lung sounds reveal a few bibasilar crackles.  CARDIAC:  Regular rate.  ABDOMEN:  Soft, benign.  EXTREMITIES:  Warm without any edema.   IMPRESSION/PLAN:  1. Mild bronchiectatic flare.  Patient to begin Omnicef x7 days.  Mucinex      DM twice a day.  Patient was given a Xopenex nebulizer treatment in      office.  2. She is to return as scheduled with Dr. Joya [name] or sooner if needed.                                   Rexene Edison, NP                                [name] [name], MD, FCCP   TP/MedQ  DD:  05/21/2006  DT:  05/22/2006  Job #:  [id number]

## 2011-02-09 NOTE — Assessment & Plan Note (Signed)
[facility]                             PULMONARY OFFICE NOTE   NAME:[name], [name]                     MRN:          [id number]  DATE:11/20/2006                            DOB:          [birth year]    [name] is a 70 year old white female with a history of idiopathic  pulmonary fibrosis, bronchiectasis.  She is noting increased cough for  the last several days, increased bloating, heartburn and congestion in  the sinuses with post-nasal drip.  She maintains prednisone 5 mg daily.   EXAM:  Temperature 98, blood pressure 114/66, pulse 91, saturation 97%  on room air.  GENERAL:  This is a well-developed, well-nourished white female in no  distress.  CHEST:  Distant breath sounds.  Dry rales at the bases.  Otherwise,  clear.  CARDIAC:  A regular rate and rhythm without S3.  Normal S1, S2.  ABDOMEN:  Soft.  Nontender.  EXTREMITIES:  No edema or clubbing.  SKIN:  Clear.   IMPRESSION:  1. Pulmonary fibrosis, idiopathic with bronchiectasis, stable at this      time.  2. Active reflux disease.  3. Allergic rhinitis.   PLAN:  Begin Nasonex 2 sprays each nostril daily.  Zegerid 40 mg daily  for a 6-week course and then discontinue.  We will see the patient back  in followup in 2 months.  She is to maintain prednisone at 5 mg daily.     Burnett [name], MD, [facility]  Electronically Signed    PEW/MedQ  DD: 11/20/2006  DT: 11/20/2006  Job #: [id number]   cc:   Biagio Borg, MD

## 2011-02-09 NOTE — Consult Note (Signed)
[name], [name] NO.:  [id number]   MEDICAL RECORD NO.:  [id number]          PATIENT TYPE:  INP   LOCATION:  [place]                         FACILITY:  [facility]   PHYSICIAN:  [name], M.D.DATE OF BIRTH:  [birth year]   DATE OF CONSULTATION:  11/15/2004  DATE OF DISCHARGE:                                   CONSULTATION   CHIEF COMPLAINT:  Abdominal pain __________.   HISTORY:  [name] is a 70 year old Caucasian female, who  underwent a routine colonoscopy by Dr. Delfin [name] on the 17th with findings  of diverticulosis and a few sigmoid colon polyps which were removed.  She  said that initially right afterward, she felt fine but then started having  upper abdominal pain and was uncomfortable, felt a little better the next  morning.  Then approximately 24 hours later, had developed full severe pain.  This was all in the upper abdomen, predominantly __________.  She did not  eat all day, was in pain, had an elevated temperature and then last evening  came to the emergency room where she was seen by Dr. __________ or I think  Dr. Deatra [name] and was admitted.  A CT was obtained, and this showed a markedly  inflamed gallbladder.  Edematous with a large stone in it.  Laboratory  studies, white count was mildly elevated, 10,600, I think, and she was  placed on Cipro and Flagyl.  The CT, however, did not show any free air.  Of  course they were doing the CT to rule out a perforation of the colon, and  then this morning she had an ultrasound of the gallbladder that showed  similar findings.  Edematous gallbladder with a stone impacted in the neck,  and I was called at approximately 11 a.m.   PAST HISTORY:  She has had a previous mastectomy by Dr. Leafy [name] years ago  with a reconstruction by Dr. Wendy [name], denies having previous episodes of  abdominal pain consistent with gallstones.  I do not think she has had any  type of intra-abdominal surgery, and I would  recommend throughout we proceed  on with an urgent cholecystectomy at this time.  I discussed with her that  if we saw any evidence of a peritonitis, not only localized around the  gallbladder, there was some question of whether this could be a colon  injury, but I think from the CT and her physical findings, that it is most  likely that she has just plain acute cholecystitis secondary to the stone,  and it is probably serendipity that it was related around the time of her  colonoscopy.  She is in agreement with this plan and will be going to the  operating room soon.   As far as review of systems, past medical history, etc., noncontributory.   CHRONIC MEDICATIONS:  I think she is on Advair.   PHYSICAL EXAMINATION:  She is not acutely febrile. She weight 171 pounds.  Her pulse is about 100.  She is definitely tender in the right upper  quadrant with muscle guarding with mild rebound.  The  lower abdomen does not  appear to be tender.      WJW/MEDQ  D:  11/15/2004  T:  11/15/2004  Job:  [id number]

## 2011-02-09 NOTE — H&P (Signed)
[name], BEST NO.:  [id number]   MEDICAL RECORD NO.:  [id number]          PATIENT TYPE:  OBV   LOCATION:  [id number]                         FACILITY:  [facility]   PHYSICIAN:  Delfin Edis, M.D. [facility]  DATE OF BIRTH:  Aug 18, 1941   DATE OF ADMISSION:  11/14/2004  DATE OF DISCHARGE:                                HISTORY & PHYSICAL   CHIEF COMPLAINT:  Abdominal pain since Friday, November 10, 2004, status  post colonoscopy.   HISTORY:  [name] is a pleasant 70 year old white female referred to Dr.  Deatra Ina by Dr. Jenny Reichmann for screening colonoscopy.  She has a history of  osteoporosis and pulmonary fibrosis, has otherwise been in fairly good  health.  She underwent the colonoscopy on November 10, 2004 with the finding  of a left colon diverticulosis and one small sigmoid colon polyp, 7 mm,  which was removed with snare cautery.  Patient relates that she felt fine  after the procedure and then went home and ate a regular meal, then  developed upper abdominal pain and was uncomfortable all night.  She felt a  little better on Saturday morning and tried to eat at lunch a [place]  sandwich, which started her pain back again more significantly.  She says  she did not eat for the rest of the day and felt a little bit better by  Sunday morning, although with persistent discomfort.  She tried to eat some  shredded wheat, which exacerbated the pain, and she says that it has not  stopped since.  She did have a low-grade temp yesterday at home, 99.6.  Has  had some nausea on the morning of admission.  No vomiting.  She relates that  she has not had a bowel movement since the colonoscopy and has passed little  gas.  She had called our office yesterday complaining of the uncomfortable  upper abdominal symptoms and was asked to try some Phazyme, which was of no  benefit.  She called back this morning with persistent pain and was advised  to comes to the emergency room.   WBC in the ER is  10.9, hemoglobin 14.4, hematocrit 47.6, potassium 3.4,  creatinine 0.8.  Liver function studies are pending.   KUB showed no evidence of free air.  No ileus.  She did have some increased  stool in the right colon.   She is admitted at this time for further diagnostic evaluation, pain  control, CT scan of the abdomen and pelvis.   CURRENT MEDICATIONS:  1.  Advair 250/50 daily.  2.  Actonel 35 weekly.  3.  Nasacort 2 puffs each nostril daily.  4.  Had been on phentermine, she is unclear of the milligrams, once daily.   ALLERGIES:  No known drug allergies.   PAST MEDICAL HISTORY:  Pertinent for pulmonary fibrosis, history of breast  cancer, status post right mastectomy in 1991, and osteoporosis.   FAMILY HISTORY:  Negative for GI disease.  She has one sister with breast  cancer.  Father with Alzheimer's.   SOCIAL HISTORY:  Patient lives with her husband, who is  disabled.  No  tobacco.  No ETOH.  She is a Agricultural engineer.   REVIEW OF SYSTEMS:  CARDIOVASCULAR:  Denied any chest pain or anginal  symptoms.  PULMONARY:  Negative for cough or shortness of breath.  No sputum  production currently.  GENITOURINARY:  Negative.  MUSCULOSKELETAL:  Negative.   PHYSICAL EXAMINATION:  VITAL SIGNS:  Temp 98, blood pressure 138/82, pulse  103.  GENERAL:  A well-developed white female in no acute distress.  She is alert  and oriented x 3.  Uncomfortable-appearing.  HEENT:  Normocephalic and atraumatic.  EOMI.  PERRLA.  Sclerae are  anicteric.  NECK:  Supple without nodes.  PULMONARY:  Clear to A&P.  CARDIOVASCULAR:  Regular rate and rhythm with S1 and S2.  ABDOMEN:  Soft.  Bowel sounds are present but hypoactive.  She is tender  across the upper abdomen, right greater than left.  There is no rebound.  No  guarding.  No mass or hepatosplenomegaly.  RECTAL:  Mucus.  There is no stool in the rectal vault.  Mucus is negative.  EXTREMITIES:  Without clubbing, cyanosis or edema.  NEUROLOGIC:  Grossly  nonfocal.   IMPRESSION:  57.  A 70 year old white female status post colonoscopy on November 10, 2004      with polypectomy and also a sigmoid colon polyp, now with a four-day      history of upper abdominal pain, rule out post polypectomy burn      syndrome, rule out seal perforation, rule out other intra-abdominal      inflammatory process, i.e., cholecystitis, diverticulitis, etc.  2.  History of pulmonary fibrosis.  3.  Osteoporosis.   PLAN:  Patient is admitted to the service of Dr. Delfin Edis for IV fluid  hydration, pain control.  She will be covered with IV Cipro and IV Flagyl.  Will keep her at bowel rest with a clear-liquid diet.  Schedule a CT scan of  the abdomen and pelvis.  Then further workup pending findings.  For details,  please see the orders.      AE/MEDQ  D:  11/14/2004  T:  11/14/2004  Job:  [id number]   cc:   Dr. Jenny Reichmann

## 2011-02-09 NOTE — Assessment & Plan Note (Signed)
[facility]                               PULMONARY OFFICE NOTE   NAME:FARLOWFranchon, Siracusa                     MRN:          [id number]  DATE:06/28/2006                            DOB:          26-Dec-1940    Ms. Walega is a 70 year old white female history of idiopathic pulmonary  fibrosis, bronchiectasis, osteoporosis.  She cannot take a deep breath  without coughing.  She was placed on pulse prednisone, she has already  tapered off.  This improved her cough substantially when she saw the nurse  practitioner on May 20, 2006.  She maintains Asmanex one spray daily,  Boniva 150 mg monthly.   EXAM:  Temp 97, blood pressure 120/76, pulse 82, saturation 99% on room air.  CHEST:  Showed dry rales bilaterally up to both mid lung zones.  CARDIAC EXAM:  Showed a regular rate and rhythm without S3, normal S1, S2.  ABDOMEN:  Soft, nontender.  EXTREMITIES:  No edema or clubbing.  Skin was clear.  NEUROLOGIC EXAM:  Intact.  HEENT EXAM:  No jugular venous distention or lymphadenopathy.  OROPHARYNX:  Clear.  NECK:  Supple.   IMPRESSION:  Idiopathic pulmonary fibrosis, bronchiectatic flare with  increased airway inflammation.   PLAN:  Discontinue Asmanex and begin pulse prednisone 40 mg a day, tapering  down by 10 mg over 5 days until the patient gets to the 20 mg a day dosing  and hold, and will see this patient back in return follow up in 1 month.       Burnett [name], MD, [facility]      PEW/MedQ  DD:  06/28/2006  DT:  07/01/2006  Job #:  [id number]

## 2011-02-09 NOTE — Telephone Encounter (Signed)
refill 

## 2011-02-09 NOTE — Assessment & Plan Note (Signed)
[facility]                             PULMONARY OFFICE NOTE   YSIDRA, KANO                     MRN:          [id number]  DATE:01/06/2007                            DOB:          [birth year]    Ms. Slutz is a 70 year old white female with a history of pulmonary  fibrosis and bronchiectasis.  Patient has continued dyspnea but no cough  or mucus production.  Recent upper respiratory tract infection, now  resolved.  She is cycled off Zegerid and not having active reflux  symptoms except for occasional minimal heartburn.  She maintains  prednisone 5 mg daily.   PHYSICAL EXAMINATION:  VITAL SIGNS:  Temp 98, blood pressure 114/70,  pulse 81, saturation 97% on room air.  CHEST:  Dry rales at the bases, otherwise clear.  CARDIAC:  Regular rate and rhythm without S3.  Normal S1 and S2.  ABDOMEN:  Soft, nontender.  EXTREMITIES:  No clubbing or edema.  SKIN:  Clear.   IMPRESSION:  Pulmonary fibrosis, stable at this time.  Associated with  reflux disease, which is also stable.  The plan is to maintain off  medications but pursue dietary changes using the reflux diet sheet.  No  other change in plan of care is made.  We will see the patient back in  followup in three months.     Burnett [name], MD, [facility]  Electronically Signed    PEW/MedQ  DD: 01/06/2007  DT: 01/07/2007  Job #: [id number]   cc:   Biagio Borg, MD

## 2011-02-09 NOTE — H&P (Signed)
[name], [name] NO.:  [id number]   MEDICAL RECORD NO.:  [id number]        PATIENT TYPE:  LINP   LOCATION:                               FACILITY:  [facility]   PHYSICIAN:  Cynda Familia, M.D.DATE OF BIRTH:  [birth year]   DATE OF ADMISSION:  04/04/2007  DATE OF DISCHARGE:                              HISTORY & PHYSICAL   CHIEF COMPLAINT:  Left knee osteoarthritis.   HISTORY OF PRESENT ILLNESS:  This is a 70 year old lady with a history  of end-stage osteoarthritis of her left knee with failure of  conservative treatment to alleviate her pain.  Due to continued  symptoms, she is now scheduled for total knee arthroplasty of the left  knee.  The surgery, risks, benefits and aftercare were discussed in  detail with the patient, questions invited and answered and surgery go  ahead as scheduled.  She has had medical clearance from Dr. Asencion Noble, her pulmonologist, and Dr. Cathlean Cower, her family doctor of  [facility], and will have [facility] hospitalists follow her in  the hospital.   ALLERGIES:  No known drug allergies.   CURRENT MEDICATIONS:  1. Boniva one time a month.  2. Prednisone 5 mg daily for pulmonary fibrosis.  3. Vitamin D 50,000 units one time a week.   PAST MEDICAL HISTORY:  1. Osteoporosis.  2. Pulmonary fibrosis.  3. Breast cancer.   PAST SURGICAL HISTORY:  1. Mastectomy.  2. Cholecystectomy.   FAMILY HISTORY:  Positive for cancer, CVA, osteoarthritis and  Alzheimer's disease.   SOCIAL HISTORY:  The patient is married.  She is retired.  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  She does  not smoke and does not drink.   REVIEW OF SYSTEMS:  CENTRAL NERVOUS SYSTEM:  Negative for vision or  dizziness.  PULMONARY:  Positive for exertional shortness of breath.  History of bronchitis, pneumonia and pulmonary fibrosis.  CARDIOVASCULAR:  No chest pain or palpitation.  GI:  Negative for  ulcers,  hepatitis.  GU:  Negative for urinary tract difficulty.  MUSCULOSKELETAL:  Positives in HPI.   PHYSICAL EXAMINATION:  VITAL SIGNS:  BP 140/80, respirations 16, pulse  76 regular.  GENERAL:  This is a well-developed, well-nourished lady in no acute  distress.  HEENT:  Head normocephalic.  Nose patent.  Ears patent.  Pupils equal,  round, reactive to light.  Throat without injection.  NECK:  Supple without adenopathy.  Carotids 2+ without bruit.  CHEST:  Clear to auscultation.  No rales or rhonchi.  Respirations 16.  HEART:  Regular rate and rhythm at 76 beats per minute without murmur.  ABDOMEN:  Soft with active bowel sounds.  No masses or organomegaly.  NEUROLOGIC:  The patient alert and oriented to time, place and person.  Cranial nerves 2-12 grossly intact.  EXTREMITIES:  Left knee with a varus deformity, 0-125 degrees range of  motion.  NEUROVASCULAR:  Intact.  Dorsalis pedis and posterior tibialis pulses  are 1+.   X-rays show end-stage osteoarthritis of left knee.   IMPRESSION:  End-stage  osteoarthritis of left knee.   PLAN:  Total knee arthroplasty of left knee.      Judith Part. Chabon, P.A.    ______________________________  Cynda Familia, M.D.    SJC/MEDQ  D:  03/17/2007  T:  03/18/2007  Job:  [id number]

## 2011-02-09 NOTE — Letter (Signed)
December 11, 2006    Biagio Borg, MD  [address], [place] [address]   RE:  JAQUA, KOPPLIN  MRN:  [id number]  /  DOB:  01/07/1941   Dear Dr. Jenny Reichmann:   It was my pleasure to see [name] as an outpatient at the [facility] on December 11, 2006.  As you know, she is a 70 year old  woman who presents with a chief complaint of chest pain.   Mrs. Funk describes three episodes over a 2-week period where she has  developed substernal chest pain.  The first episode occurred  approximately 2 weeks ago when she experienced 10 minutes of sharp chest  pain in the center of the chest as well as the right shoulder and back.  The onset was when she was sitting down to eat lunch but she had not  started eating food.  This resolved spontaneously after 10 minutes and  she did well until last week when she was working in the yard and felt  lightheaded after doing moderate-level work with pulling weeds and  gardening.  Following her lightheaded spell she developed pain in her  chest going to the back and right shoulder.  The episode lasted 2 hours  in all.  She experienced associated diaphoresis and shortness of breath.  A few days later she had a similar episode that was not as severe, nor  did it last for more than just a few minutes.  She has no prior history  of chest pain.  She has had no symptoms since her most recent episode a  few days ago.  She describes chronic exertional dyspnea that has been  attributed to her pulmonary fibrosis.  She has had no change in her  exertional symptoms.  She denies lightheadedness, syncope, orthopnea,  PND, edema, or palpitations.   Her past medical history is pertinent for the following:  1. Idiopathic pulmonary fibrosis.  2. History of ductal carcinoma in situ status post left mastectomy in      1990.  3. Cholecystectomy in 2006.  4. Severe osteoarthritis.  5. Replacement of ruptured breast implant in 2007.   Current  medications include prednisone 5 mg daily, Boniva once monthly,  and baby aspirin daily.   ALLERGIES:  No known drug allergies.   SOCIAL HISTORY:  The patient is married with one child.  She is a  retired Glass blower/designer.  She does not smoke cigarettes, drink alcohol or  have any history of illicit drug use.  She does not exercise.   FAMILY HISTORY:  This is pertinent for her mother who died of brain  cancer at age 60 and father died of Alzheimer's disease at age 82.  She  has two sisters who are alive and well.  There is no history of coronary  artery disease in the family.   REVIEW OF SYSTEMS:  A complete 12-point review of systems was performed.  The only pertinent positive was left knee pain from osteoarthritis.  All  other systems were reviewed and were negative.   PHYSICAL EXAMINATION:  GENERAL:  The patient is alert and oriented.  She  is in no acute distress.  VITAL SIGNS:  Her weight is 193 pounds, blood pressure is 128/78 in the  right arm and 134/74 in the left arm, heart rate is 97, respiratory rate  is 16.  HEENT:  Normal.  NECK:  Normal carotid upstroke without bruits.  Jugular venous pressure  is normal.  There is no thyromegaly or thyroid nodules.  LUNGS:  Clear bilaterally with the exception of fine bibasilar rales.  HEART:  The apex is discrete and nondisplaced.  There is no right  ventricular heave or lift.  Heart is regular rate and rhythm without  murmurs or gallops.  ABDOMEN:  Soft, nontender, no abdominal bruits, no organomegaly.  BACK:  There is no flank tenderness.  EXTREMITIES:  No cyanosis, clubbing or edema.  Peripheral pulses are 2+  and equal throughout.  SKIN:  Warm and dry.  The skin appears somewhat thin in the lower  extremities.  There are minor areas of ecchymoses present.  NEUROLOGIC:  Cranial nerves II-XII are intact.  Strength is 5/5 and  equal in the arms and legs.   EKG which was performed on March 14 in Dr. Gwynn Burly office demonstrates   normal sinus rhythm with a heart rate of 88 beats per minute.  The  tracing is within normal limits.  There is borderline left atrial  enlargement.  There are no ST segment or T wave changes.   ASSESSMENT:  Mrs. Laroe is a 70 year old woman with episodes of chest  pain.  Her pain has mostly atypical features, although raises some  suspicion of possible obstructive coronary disease.  The one episode  occurred with more vigorous activity than normal, as it happened with  gardening and yardwork.  She has a lack of cardiac risk factors other  than her age.  We have made arrangements for an adenosine Myoview stress  study to rule out a significant area of ischemia.  She should continue  on a daily aspirin.  Otherwise, would not recommend any medical changes.  If her stress study turns out to be normal she may require some further  evaluation for a GI etiology of chest pain or other noncardiac causes.   Will be in contact with Mrs. Helming as soon as her stress test results  are available.  Dr. Jenny Reichmann, thanks again for allowing me to see this  delightful lady in consultation.  Please feel free to call at any time  with questions regarding her care.    Sincerely,      Juanda Bond. Burt Knack, MD  Electronically Signed    MDC/MedQ  DD: 12/11/2006  DT: 12/12/2006  Job #: [id number]

## 2011-02-09 NOTE — Op Note (Signed)
NAMECALLAN, BEGANOVIC NO.:  [id number]   MEDICAL RECORD NO.:  [id number]          PATIENT TYPE:  INP   LOCATION:  [place]                         FACILITY:  [facility]   PHYSICIAN:  Orson Ape. Weatherly, M.D.DATE OF BIRTH:  [birth year]   DATE OF PROCEDURE:  11/15/2004  DATE OF DISCHARGE:                                 OPERATIVE REPORT   PREOPERATIVE DIAGNOSIS:  Acute cholecystitis with stone.   POSTOPERATIVE DIAGNOSIS:  Acute cholecystitis with stone.   OPERATION:  Laparoscopic cholecystectomy with cholangiogram.   SURGEON:  Dr. Jeanella Anton   ASSISTANT:  Dr. Kathrin Penner   ANESTHESIA:  General.   HISTORY:  Jewell Ambriz is a 70 year old female, who was admitted last  evening being approximately 4 days following colonoscopy which was done by,  I think Dr. Deatra Ina.  She was admitted to Dr. Sydell Axon Brodie's service, and a CT  was obtained that did not show any free air but showed findings of acute  cholecystitis with large stone in the gallbladder.  White count was  minimally elevated.  She was started on Flagyl and Cipro and a repeat  ultrasound was done this morning that showed acute cholecystitis with stone,  and I was asked to see the patient.  She was definitely tender in the right  upper quadrant.  She is not tender in the lower abdomen.  Thankfully, it is  most likely this is not related to the colonoscopy, as her clinical findings  are more consistent with acute cholecystitis than a generalized peritonitis  from a colonic injury.  The patient has also not had any bleeding and said  she did eat the day after surgery without problems.  She is on Flagyl and  Cipro, and no additional antibiotics were given.   She was taken to the operative suite and after general anesthesia, the  abdomen was prepped with Betadine surgical scrub and solution and draped in  a sterile manner.  An endotracheal tube had been placed and an oral tube  into the stomach.  A small  incision was made below the umbilicus.  The  fascia was identified.  This was picked up between [facility] and a small  opening made into the peritoneal cavity.  The peritoneum was identified and  kind of carefully opened into the peritoneal cavity.  The Hasson cannula was  introduced after a pursestring suture and opening into the peritoneal cavity  with visualization, the gallbladder was acutely inflamed with adhesions all  up and around it, but there was no evidence of a generalized peritonitis.  There was a little bit of bile-tinge right around the gallbladder itself,  but it was certainly not generalized.  The upper 10 mm trocar was placed in  the subxiphoid area, and the two lateral 5 mm ports were placed after  anesthetizing the fascia by Dr. Bubba Camp, the assistant.  The actual omentum  that was firmly adherent around this very thickened and inflamed gallbladder  was peeled away and then using the ___________ aspirator, we aspirated the  bile from the gallbladder.  This did allow [place] to kind of grasp  it, and we  could pick it up and rotate it to the left.  We kind of peeled the omentum  away from the gallbladder.  It was markedly inflamed but fortunately  proximally we were able to dissect in between and kind of going down right  through the cystic duct fairly easily with the lymph node being medially.  I  placed a clip on the junction of the cystic duct gallbladder and then made a  little opening just proximally to this.  A Cook catheter was introduced,  held in place with a clip, and then an x-ray obtained.  There was good,  prompt filling in the extrahepatic biliary system and good flow into the  duodenum, and the intrahepatic __________.  Then removed the catheter and  triply clipped the little short cystic duct, divided it, then identified the  cystic artery that was doubly clipped proximally, single distal and divided  the posterior branch of the cystic artery which likewise was  identified,  clipped and divided, and then the gallbladder was kind of freed from its bed  using either the hook or the spatula electrocautery.  Good hemostasis was  obtained.  We placed the gallbladder into the EndoCatch bag and then after  we were satisfied with hemostasis, switched the camera to the upper 10 mm  port, grabbed the bag, and brought it up to the skin level.  It was  necessary to kind of increase the incision slightly so that we could bring  the gallbladder up or at least the neck of the gallbladder at the skin  level, opened it after opening the bag, brought this large stone out, and  then the gallbladder was brought out through the skin.  I closed the fascia  at the umbilicus with figure-of-eights of 0 Vicryl with a T5 needle and then  thoroughly irrigated again.  I had placed a Blake drain, brought it out  through the lateral 5 mm port since I would not be surprised if she has a  little drainage because of the marked inflammation.  With hemostasis good,  the carbon dioxide had been released, and the trocars withdrawn under direct  vision.  The patient tolerated the procedure well and was taken to the  recovery room, and we will keep her on antibiotics.  Hopefully, she will be  __________ tomorrow and probably be discharged home Friday.      WJW/MEDQ  D:  11/15/2004  T:  11/15/2004  Job:  [id number]   cc:   Delfin Edis, M.D. [facility]   Manus Rudd, MD  Fax: [phone]

## 2011-02-09 NOTE — Op Note (Signed)
   NAME:  [name], [name]                        ACCOUNT NO.:  [id number]   MEDICAL RECORD NO.:  [id number]                   PATIENT TYPE:  AMB   LOCATION:  ENDO                                 FACILITY:  [place]   PHYSICIAN:  Asencion Noble, M.D. [facility]            DATE OF BIRTH:  January 08, 1941   DATE OF PROCEDURE:  08/02/2003  DATE OF DISCHARGE:                                 OPERATIVE REPORT   PROCEDURE:  Bronchoscopy.   PULMONOLOGIST:  Asencion Noble, M.D.   ANESTHESIA:  1% Xylocaine local.   PREOPERATIVE MEDICATION:  Demerol 40 mg IV push, Versed 5 mg IV push.   INDICATIONS:  Diffuse pulmonary interstitial changes; evaluate for pulmonary  fibrosis.   DESCRIPTION OF PROCEDURE:  The Olympus video bronchoscope was introduced  into the right nares.  The upper airways were visualized and were  unremarkable.  The entire tracheobronchial tree was visualized and revealed  mild tracheobronchitis without evidence of endobronchial lesions. Attention  was then paid to the right middle lobe.  Bronchial lavage was obtained,  approximately 120 cc volume infused.  Approximately 75 cc volume returned.  The patient then underwent transbronchial biopsies x5 from the right lower  lobe lateral segment.  There was about a 25 mL bleed with this.  It was  controlled with topical epinephrine.   COMPLICATIONS:  Minimal bleeding controlled with topical epinephrine.   IMPRESSION:  Bilateral pulmonary infiltrates; evaluate for cause.  Rule out  pulmonary fibrosis.   RECOMMENDATIONS:  Follow up pathology and microbiology.                                               Asencion Noble, M.D. [facility]    PW/MEDQ  D:  08/02/2003  T:  08/02/2003  Job:  [id number]   cc:   Biagio Borg, M.D. [facility]

## 2011-02-09 NOTE — Assessment & Plan Note (Signed)
[facility]                             PULMONARY OFFICE NOTE   NAME:FARLOWKalicia, Wicht                     MRN:          [id number]  DATE:09/20/2006                            DOB:          Feb 13, 1941    Ms. Martinz returns today in follow up.  She is a 70 year old white  female.  History of idiopathic pulmonary fibrosis, associated  bronchiectasis.  She is now back on Prednisone therapy down to 10 mg a  day and doing well with this.  She has some weight gain on the  Prednisone.   PHYSICAL EXAMINATION:  VITAL SIGNS:  Temperature 98, blood pressure  138/72, pulse 95, saturation 95% on room air.  CHEST:  Distant breath sounds without evidence of wheeze or rhonchi.  CARDIAC:  Regular rate and rhythm. No S3.  ABDOMEN:  Soft, non tender.   LABORATORY DATA:  Recent chest CT scan showed stable fibrosis effecting  the peripheral lungs zones with no acute areas of a change.  There is  associated bronchiectasis seen.  Pulmonary functions are obtained and  showed normal spirometry, normal lung volumes, diffusion capacity is 56%  predicted, which is down from 70% predicted from 2 years ago.   PLAN:  Taper Prednisone down to 50 mg a day and hold at that level.  Will see the patient back in follow up in 2 months.     Burnett [name], MD, [facility]  Electronically Signed    PEW/MedQ  DD: 09/20/2006  DT: 09/20/2006  Job #: [id number]   cc:   Biagio Borg, MD

## 2011-02-12 ENCOUNTER — Encounter: Payer: Self-pay | Admitting: Internal Medicine

## 2011-02-12 LAB — UIFE/LIGHT CHAINS/TP QN, 24-HR UR
Albumin, U: DETECTED
Alpha 2, Urine: DETECTED — AB
Beta, Urine: DETECTED — AB
Free Lt Chn Excr Rate: 96.8 mg/d
Total Protein, Urine-Ur/day: 109 mg/d (ref 10–140)

## 2011-02-12 LAB — PROTEIN ELECTROPHORESIS, SERUM, WITH REFLEX
Alpha-2-Globulin: 13.9 % — ABNORMAL HIGH (ref 7.1–11.8)
Beta 2: 4.7 % (ref 3.2–6.5)
Beta Globulin: 6.2 % (ref 4.7–7.2)
Total Protein, Serum Electrophoresis: 6.3 g/dL (ref 6.0–8.3)

## 2011-02-16 ENCOUNTER — Telehealth: Payer: Self-pay | Admitting: Internal Medicine

## 2011-02-16 DIAGNOSIS — N259 Disorder resulting from impaired renal tubular function, unspecified: Secondary | ICD-10-CM

## 2011-02-16 NOTE — Telephone Encounter (Signed)
Please call patient: renal function is low with a creatinine clearance of 38. The lab tests for underlying medical problems that might cause poor kidney function were normal. Will need to follow-up renal function in 3 mnths (lab entered). Sed rate is good at 25.  OV if needed to answer any additional questions.

## 2011-02-16 NOTE — Telephone Encounter (Signed)
Left a message: no underlying problem. Creatnine clearance abnormal. Will need f/u lab in 3 months. Needs ov if this phone message isn't good enough

## 2011-02-16 NOTE — Telephone Encounter (Signed)
Pt is extremely confused on her labs she has many questions that I dont feel confident in answering. She said she was not going to make an appt and states she is going out of town and would like a detailed message left on her answering machine

## 2011-03-01 ENCOUNTER — Other Ambulatory Visit: Payer: Self-pay | Admitting: Cardiology

## 2011-03-01 DIAGNOSIS — N189 Chronic kidney disease, unspecified: Secondary | ICD-10-CM

## 2011-03-02 ENCOUNTER — Ambulatory Visit (INDEPENDENT_AMBULATORY_CARE_PROVIDER_SITE_OTHER): Payer: Medicare Other | Admitting: Cardiology

## 2011-03-02 DIAGNOSIS — N189 Chronic kidney disease, unspecified: Secondary | ICD-10-CM

## 2011-03-06 ENCOUNTER — Encounter: Payer: Self-pay | Admitting: Internal Medicine

## 2011-03-08 ENCOUNTER — Encounter: Payer: Self-pay | Admitting: Internal Medicine

## 2011-03-14 ENCOUNTER — Encounter: Payer: Self-pay | Admitting: Internal Medicine

## 2011-04-02 ENCOUNTER — Other Ambulatory Visit (INDEPENDENT_AMBULATORY_CARE_PROVIDER_SITE_OTHER): Payer: Medicare Other

## 2011-04-02 ENCOUNTER — Ambulatory Visit (INDEPENDENT_AMBULATORY_CARE_PROVIDER_SITE_OTHER): Payer: Medicare Other | Admitting: Internal Medicine

## 2011-04-02 VITALS — BP 158/90 | HR 69 | Temp 98.1°F | Wt 184.0 lb

## 2011-04-02 DIAGNOSIS — I10 Essential (primary) hypertension: Secondary | ICD-10-CM

## 2011-04-02 DIAGNOSIS — N390 Urinary tract infection, site not specified: Secondary | ICD-10-CM

## 2011-04-02 DIAGNOSIS — N259 Disorder resulting from impaired renal tubular function, unspecified: Secondary | ICD-10-CM

## 2011-04-02 DIAGNOSIS — M316 Other giant cell arteritis: Secondary | ICD-10-CM

## 2011-04-02 DIAGNOSIS — N182 Chronic kidney disease, stage 2 (mild): Secondary | ICD-10-CM

## 2011-04-02 LAB — CBC WITH DIFFERENTIAL/PLATELET
Eosinophils Relative: 1.2 % (ref 0.0–5.0)
HCT: 43.4 % (ref 36.0–46.0)
Lymphocytes Relative: 9.5 % — ABNORMAL LOW (ref 12.0–46.0)
Lymphs Abs: 1.1 10*3/uL (ref 0.7–4.0)
Monocytes Relative: 1.2 % — ABNORMAL LOW (ref 3.0–12.0)
Neutrophils Relative %: 88.1 % — ABNORMAL HIGH (ref 43.0–77.0)
Platelets: 376 10*3/uL (ref 150.0–400.0)
WBC: 11.3 10*3/uL — ABNORMAL HIGH (ref 4.5–10.5)

## 2011-04-02 LAB — COMPREHENSIVE METABOLIC PANEL
ALT: 22 U/L (ref 0–35)
Albumin: 4.3 g/dL (ref 3.5–5.2)
CO2: 28 mEq/L (ref 19–32)
Calcium: 9.7 mg/dL (ref 8.4–10.5)
Chloride: 102 mEq/L (ref 96–112)
GFR: 31.5 mL/min — ABNORMAL LOW (ref >60.00)
Glucose, Bld: 129 mg/dL — ABNORMAL HIGH (ref 70–99)
Potassium: 4.8 mEq/L (ref 3.5–5.1)
Sodium: 142 mEq/L (ref 135–145)
Total Protein: 7.4 g/dL (ref 6.0–8.3)

## 2011-04-02 LAB — URINALYSIS, ROUTINE W REFLEX MICROSCOPIC
Hgb urine dipstick: NEGATIVE
Leukocytes, UA: NEGATIVE
Specific Gravity, Urine: 1.025 (ref 1.000–1.030)
Urobilinogen, UA: 0.2 (ref 0.0–1.0)

## 2011-04-02 MED ORDER — AMLODIPINE BESYLATE 10 MG PO TABS: 10.0000 mg | ORAL_TABLET | Freq: Every day | ORAL | Status: DC

## 2011-04-02 NOTE — Progress Notes (Signed)
  Subjective:    Patient ID: [name], female    DOB: February 24, 1941, 70 y.o.   MRN: [id number]  HPI Mrs. Pilat was at [employer] and she felt weak and was hypotensive. She had nausea, confusion and shortness of breath. For these symptoms she went to the local hospital where she had x-ray, blood work with sed rate 56, and ekg. CXR- showed spots on the lung c/w pulmonary fibrosis. She was given IV fluids, IV antibiotics for pneumonia and UTI. At admission she was told she had fluid around her heart. She was seen by cardiology - had echo cardiogram revealing normal EF and normal wall motion. She had LE venous dopplers that were negative. She was put back to IV fluids. She was told that her kidney function was low and that furosemide was too strong. At discharge she was started on lisinopril, taken off azor and taken off furosemide. She had been treated with antibiotics for the 3 days. Since discharge and being home she has been doing OK. She has stopped the azor and the furosemide but never did start the lisinopril. Her blood pressure has not been low. She is now [place] as to what medications to take.  PMH, FamHx and SocHx reviewed for any changes and relevance.     Review of Systems Review of Systems  Constitutional:  Negative for fever, chills, activity change and unexpected weight change.  HEENT:  Negative for hearing loss, ear pain, congestion, neck stiffness and postnasal drip. Negative for sore throat or swallowing problems. Negative for dental complaints.   Eyes: Negative for vision loss or change in visual acuity.  Respiratory: Negative for chest tightness and wheezing.   Cardiovascular: Negative for chest pain and palpitation. No decreased exercise tolerance Gastrointestinal: No change in bowel habit. No bloating or gas. No reflux or indigestion Genitourinary: Negative for urgency, frequency, flank pain and difficulty urinating.  Musculoskeletal:Positive for chronic myalgias, back  pain, arthralgias.  Neurological: Negative for dizziness, tremors, weakness and headaches.  Hematological: Negative for adenopathy.  Psychiatric/Behavioral: Negative for behavioral problems and dysphoric mood.       Objective:   Physical Exam Vitals reviewed and noted for elevated BP Gen'l - an older woman who is in no distress HEENT - C&S clear Pulmonary - no increased WOB, no rales or wheezing Cor - 2+ radial pulse, RRR Extremities - trace edema       Assessment & Plan:

## 2011-04-02 NOTE — Patient Instructions (Signed)
Kidney function - you have decreased kidney function. The tests we did do rule out several causes: multiple myeloma, severe damage to the filtering mechanism. The cause can be vasculitis, diabetes, blood pressure or a combination of all three. Plan - will refer you to a kidney specialist to determine a cause and to recommend best treatment.  Blood pressure - sounds variable. There have been several medication changes. Plan is to take the furosemide 40mg  once a day; take amlodipine 10mg  once a day; STOP azor; do not fill lisinopril. If your blood pressure is less then 90/70 hold your furosemide that day.  Urinary tract infection: will check a urinalysis today.   Temporal arteritis - will leave that to Dr. [name].  Lungs - seem stable.

## 2011-04-03 ENCOUNTER — Telehealth: Payer: Self-pay | Admitting: *Deleted

## 2011-04-03 NOTE — Telephone Encounter (Signed)
Patient informed. 

## 2011-04-03 NOTE — Telephone Encounter (Signed)
Pt left vm - She says MD was going to call in new BP med to [facility]? Below is copied & pasted OV notes - nothing new to add correct?   Blood pressure - sounds variable. There have been several medication changes. Plan is to take the furosemide 40mg  once a day; take amlodipine 10mg  once a day; STOP azor; do not fill lisinopril. If your blood pressure is less then 90/70 hold your furosemide that day

## 2011-04-03 NOTE — Telephone Encounter (Signed)
Removed one medication by switching from azor to just amlodipine. I haven't sent it in yet because I did get back to finish her note. Please call in the amlodipine 10mg  once a day # 30, refill 11

## 2011-04-03 NOTE — Telephone Encounter (Signed)
Pt states she left paperwork for Dental clearance in our office after 5pm yesterday when she was done with labs. She is unsure if anyone got the paperwork.

## 2011-04-04 DIAGNOSIS — N182 Chronic kidney disease, stage 2 (mild): Secondary | ICD-10-CM | POA: Insufficient documentation

## 2011-04-04 NOTE — Assessment & Plan Note (Signed)
Patient with multiple medical problems including vasculitis that may contribute to renal disease.   Plan - refer to nephrology for evaluation of underlying cause of CKD: DM vs vasculitis vs HTN or a combination of all factors.

## 2011-04-04 NOTE — Assessment & Plan Note (Signed)
Patinet haw had low BP readings but now off of azor and furosemide her BP is elevated. She is asymptomatic  Plan - Resume furosemide- for mgt of BP and peripheral edema           Will not restart ARB or ACE            Start amlodipine 10mg  as a single agent            Monitor BP and report backif still elevated.

## 2011-04-04 NOTE — Assessment & Plan Note (Signed)
ESR is elevated at out of town hospital lab. She will continue to follow with Dr. [name] who will adjust her medications as needed.

## 2011-04-19 ENCOUNTER — Emergency Department (HOSPITAL_COMMUNITY): Payer: Medicare Other

## 2011-04-19 ENCOUNTER — Emergency Department (HOSPITAL_COMMUNITY)
Admission: EM | Admit: 2011-04-19 | Discharge: 2011-04-19 | Disposition: A | Discharge status: Home / Self Care | Payer: Medicare Other | Attending: Emergency Medicine | Admitting: Emergency Medicine

## 2011-04-19 DIAGNOSIS — S8010XA Contusion of unspecified lower leg, initial encounter: Secondary | ICD-10-CM | POA: Insufficient documentation

## 2011-04-19 DIAGNOSIS — I10 Essential (primary) hypertension: Secondary | ICD-10-CM | POA: Insufficient documentation

## 2011-04-19 DIAGNOSIS — Z8673 Personal history of transient ischemic attack (TIA), and cerebral infarction without residual deficits: Secondary | ICD-10-CM | POA: Insufficient documentation

## 2011-04-19 DIAGNOSIS — Z853 Personal history of malignant neoplasm of breast: Secondary | ICD-10-CM | POA: Insufficient documentation

## 2011-04-19 DIAGNOSIS — H5440 Blindness, one eye, unspecified eye: Secondary | ICD-10-CM | POA: Insufficient documentation

## 2011-04-19 DIAGNOSIS — W2209XA Striking against other stationary object, initial encounter: Secondary | ICD-10-CM | POA: Insufficient documentation

## 2011-04-27 ENCOUNTER — Ambulatory Visit (INDEPENDENT_AMBULATORY_CARE_PROVIDER_SITE_OTHER): Payer: Medicare Other | Admitting: Internal Medicine

## 2011-04-27 DIAGNOSIS — M549 Dorsalgia, unspecified: Secondary | ICD-10-CM

## 2011-04-27 MED ORDER — OXYCODONE HCL 15 MG PO TB12: 15.0000 mg | ORAL_TABLET | Freq: Two times a day (BID) | ORAL | Status: DC

## 2011-04-29 NOTE — Assessment & Plan Note (Signed)
Patient with chronic back pain.  Plan - reduce oxycontin to 15mg , down from 20 mg, bid.           Return in one month for reassessment and further taper.

## 2011-04-29 NOTE — Progress Notes (Signed)
  Subjective:    Patient ID: [name], female    DOB: [birth year], 70 y.o.   MRN: [id number]  HPI [name] is well known to the  Practice and her history is well documented. She presents today to discuss pain management: she wishes to taper down her oxycontin dose which she has been taking since her back surgery almost a year ago.   PMH, FamHx and SocHx reviewed for any changes and relevance.    Review of Systems System review is negative for any constitutional, cardiac, pulmonary, GI or neuro symptoms or complaints     Objective:   Physical Exam Vitals stable Cor - RRR Pulm - normal respirations.       Assessment & Plan:

## 2011-05-29 ENCOUNTER — Other Ambulatory Visit (INDEPENDENT_AMBULATORY_CARE_PROVIDER_SITE_OTHER): Payer: Medicare Other

## 2011-05-29 DIAGNOSIS — N259 Disorder resulting from impaired renal tubular function, unspecified: Secondary | ICD-10-CM

## 2011-05-29 LAB — COMPREHENSIVE METABOLIC PANEL
ALT: 24 U/L (ref 0–35)
BUN: 38 mg/dL — ABNORMAL HIGH (ref 6–23)
CO2: 29 mEq/L (ref 19–32)
Calcium: 8.5 mg/dL (ref 8.4–10.5)
Chloride: 104 mEq/L (ref 96–112)
Creatinine, Ser: 1.5 mg/dL — ABNORMAL HIGH (ref 0.4–1.2)
GFR: 35.29 mL/min — ABNORMAL LOW (ref >60.00)

## 2011-05-31 ENCOUNTER — Ambulatory Visit (INDEPENDENT_AMBULATORY_CARE_PROVIDER_SITE_OTHER): Payer: Medicare Other | Admitting: Internal Medicine

## 2011-05-31 DIAGNOSIS — N182 Chronic kidney disease, stage 2 (mild): Secondary | ICD-10-CM

## 2011-05-31 MED ORDER — OXYCODONE HCL 15 MG PO TB12: 15.0000 mg | ORAL_TABLET | Freq: Two times a day (BID) | ORAL | Status: DC

## 2011-05-31 MED ORDER — AMLODIPINE BESYLATE 10 MG PO TABS: 10.0000 mg | ORAL_TABLET | Freq: Every day | ORAL | Status: DC

## 2011-05-31 MED ORDER — SIMVASTATIN 5 MG PO TABS: 5.0000 mg | ORAL_TABLET | Freq: Every day | ORAL | Status: DC

## 2011-05-31 MED ORDER — FUROSEMIDE 40 MG PO TABS: 40.0000 mg | ORAL_TABLET | Freq: Every day | ORAL | Status: DC

## 2011-05-31 NOTE — Progress Notes (Signed)
  Subjective:    Patient ID: [name], female    DOB: 1941/07/30, 70 y.o.   MRN: [id number]  HPI [name] presents for review of recent lab - renal function. Record reviewed: 24 hr urine with creatinine clearance of 38 in May '12, total urine protein was normal at 54. Urine Protein electrophoresis was negative. Lab Sept 4th with creatinine of 1.5 down from 1.7 in July. She has no other concerns or complaints at this visit.  I have reviewed the patient's medical history in detail and updated the computerized patient record.    Review of Systems System review is negative for any constitutional, cardiac, pulmonary, GI or neuro symptoms or complaints     Objective:   Physical Exam Vitals reviewed - stable BP, heart rate and afebrile Gen'l - heavyset white woman in no distress Cor - 2+ pulse , RRR Resp - normal Neuro - A&O x 3, normal gait, without tremor       Assessment & Plan:

## 2011-06-01 NOTE — Assessment & Plan Note (Signed)
[name] is very worried about renal failure. She is also concerned that it may be several more weeks before she can be seen by nephrology. Her labs are reviewed which show a stable creatinine. She is reassured about the mild degree of renal insufficiency she has, although understated by the BUN/Cr.  Plan - continue present medical regimen           Keep appointment with nephrology when scheduled.

## 2011-06-04 ENCOUNTER — Encounter: Payer: Self-pay | Admitting: Internal Medicine

## 2011-06-05 ENCOUNTER — Other Ambulatory Visit: Payer: Self-pay | Admitting: *Deleted

## 2011-06-05 MED ORDER — METHOCARBAMOL 750 MG PO TABS: 750.0000 mg | ORAL_TABLET | Freq: Three times a day (TID) | ORAL | Status: DC

## 2011-06-05 MED ORDER — PREDNISONE 10 MG PO TABS: 10.0000 mg | ORAL_TABLET | Freq: Every day | ORAL | Status: DC

## 2011-06-05 MED ORDER — FUROSEMIDE 40 MG PO TABS: 40.0000 mg | ORAL_TABLET | Freq: Every day | ORAL | Status: DC

## 2011-06-05 MED ORDER — PREDNISONE 2.5 MG PO TABS: 5.0000 mg | ORAL_TABLET | Freq: Every day | ORAL | Status: DC

## 2011-06-05 MED ORDER — GABAPENTIN 300 MG PO CAPS: 300.0000 mg | ORAL_CAPSULE | Freq: Two times a day (BID) | ORAL | Status: DC

## 2011-06-05 MED ORDER — AMLODIPINE BESYLATE 10 MG PO TABS: 10.0000 mg | ORAL_TABLET | Freq: Every day | ORAL | Status: DC

## 2011-06-14 ENCOUNTER — Telehealth: Payer: Self-pay | Admitting: *Deleted

## 2011-06-14 NOTE — Telephone Encounter (Signed)
Pt scheduled f/u OV on 10/18. Does she need this f/u? If yes, does she need labs prior?

## 2011-06-15 LAB — URINE MICROSCOPIC-ADD ON

## 2011-06-15 LAB — CBC
HCT: 31.8 — ABNORMAL LOW
HCT: 33.4 — ABNORMAL LOW
HCT: 31.8 — ABNORMAL LOW
HCT: 33.1 — ABNORMAL LOW
HCT: 35.9 — ABNORMAL LOW
Hemoglobin: 10.6 — ABNORMAL LOW
Hemoglobin: 10.7 — ABNORMAL LOW
Hemoglobin: 11.2 — ABNORMAL LOW
Hemoglobin: 11 — ABNORMAL LOW
Hemoglobin: 12.1
MCHC: 33.6
MCHC: 33.3
MCHC: 34.1
MCV: 86.8
MCV: 84.2
MCV: 84.6
MCV: 84.7
RBC: 3.67 — ABNORMAL LOW
RBC: 3.84 — ABNORMAL LOW
RBC: 3.76 — ABNORMAL LOW
RBC: 3.92
RBC: 4.27
RDW: 13.9
RDW: 14.1
WBC: 11.9 — ABNORMAL HIGH
WBC: 20.6 — ABNORMAL HIGH
WBC: 11.4 — ABNORMAL HIGH
WBC: 16.9 — ABNORMAL HIGH
WBC: 26.5 — ABNORMAL HIGH

## 2011-06-15 LAB — LUPUS ANTICOAGULANT PANEL
DRVVT: 55.7 — ABNORMAL HIGH (ref 36.1–47.0)
Lupus Anticoagulant: NOT DETECTED
PTT Lupus Anticoagulant: 61.2 — ABNORMAL HIGH (ref 36.3–48.8)
PTTLA 4:1 Mix: 60.4 — ABNORMAL HIGH (ref 36.3–48.8)
PTTLA Confirmation: 28.8 — ABNORMAL HIGH (ref <8.0)
dRVVT Incubated 1:1 Mix: 42.6 (ref 36.1–47.0)

## 2011-06-15 LAB — RETICULOCYTES
Retic Count, Absolute: 52.8
Retic Ct Pct: 1.3

## 2011-06-15 LAB — BASIC METABOLIC PANEL
CO2: 27
CO2: 26
CO2: 27
Calcium: 8.4
Chloride: 105
Chloride: 100
Chloride: 105
Creatinine, Ser: 1.3 — ABNORMAL HIGH
GFR calc Af Amer: >60
GFR calc Af Amer: 49 — ABNORMAL LOW
GFR calc Af Amer: 49 — ABNORMAL LOW
Glucose, Bld: 105 — ABNORMAL HIGH
Potassium: 4
Potassium: 4.1
Potassium: 4.7
Sodium: 140
Sodium: 135

## 2011-06-15 LAB — CULTURE, BLOOD (ROUTINE X 2)

## 2011-06-15 LAB — CARDIAC PANEL(CRET KIN+CKTOT+MB+TROPI)
CK, MB: 1.6
Troponin I: 0.04
Troponin I: 0.04

## 2011-06-15 LAB — DIFFERENTIAL
Basophils Absolute: 0
Basophils Relative: 0
Basophils Relative: 0
Basophils Relative: 0
Eosinophils Absolute: 0.4
Eosinophils Absolute: 0
Eosinophils Absolute: 0.2
Eosinophils Relative: 2
Lymphs Abs: 1.2
Lymphs Abs: 0.9
Lymphs Abs: 1.3
Monocytes Absolute: 0.9
Monocytes Absolute: 1.2 — ABNORMAL HIGH
Monocytes Absolute: 0.9
Monocytes Absolute: 1.7 — ABNORMAL HIGH
Monocytes Relative: 5
Monocytes Relative: 5
Monocytes Relative: 6
Neutro Abs: 16.6 — ABNORMAL HIGH
Neutro Abs: 23.4 — ABNORMAL HIGH
Neutrophils Relative %: 86 — ABNORMAL HIGH
Neutrophils Relative %: 88 — ABNORMAL HIGH
Neutrophils Relative %: 88 — ABNORMAL HIGH

## 2011-06-15 LAB — SEDIMENTATION RATE
Sed Rate: 40 — ABNORMAL HIGH
Sed Rate: 97 — ABNORMAL HIGH

## 2011-06-15 LAB — PROTEIN ELECTROPH W RFLX QUANT IMMUNOGLOBULINS
Albumin ELP: 35.9 — ABNORMAL LOW
Alpha-2-Globulin: 18.8 — ABNORMAL HIGH
Beta 2: 7.5 — ABNORMAL HIGH
Beta Globulin: 6.3
Total Protein ELP: 6

## 2011-06-15 LAB — VITAMIN B12
Vitamin B-12: 356 (ref 211–911)
Vitamin B-12: 398 (ref 211–911)

## 2011-06-15 LAB — RAPID URINE DRUG SCREEN, HOSP PERFORMED
Barbiturates: NOT DETECTED
Opiates: NOT DETECTED

## 2011-06-15 LAB — COMPREHENSIVE METABOLIC PANEL
ALT: 26
AST: 21
AST: 23
Alkaline Phosphatase: 83
BUN: 58 — ABNORMAL HIGH
CO2: 25
CO2: 27
Chloride: 105
Chloride: 100
Creatinine, Ser: 1.39 — ABNORMAL HIGH
GFR calc non Af Amer: 49 — ABNORMAL LOW
GFR calc non Af Amer: 38 — ABNORMAL LOW
Glucose, Bld: 108 — ABNORMAL HIGH
Glucose, Bld: 122 — ABNORMAL HIGH
Potassium: 4.1
Sodium: 137
Total Bilirubin: 0.9

## 2011-06-15 LAB — CK TOTAL AND CKMB (NOT AT ARMC)
Relative Index: INVALID
Total CK: 38

## 2011-06-15 LAB — PROTEIN S, TOTAL: Protein S Ag, Total: 183 % — ABNORMAL HIGH (ref 70–140)

## 2011-06-15 LAB — URINALYSIS, ROUTINE W REFLEX MICROSCOPIC
Bilirubin Urine: NEGATIVE
Ketones, ur: NEGATIVE
Specific Gravity, Urine: 1.016
pH: 7

## 2011-06-15 LAB — NEURONAL NUCLEAR AUTOABS(IFA), IGG-BLD: ANNA IFA Titer Screen: <1:10 {titer}

## 2011-06-15 LAB — HOMOCYSTEINE
Homocysteine: 10.8
Homocysteine: 12.5

## 2011-06-15 LAB — CARDIOLIPIN ANTIBODIES, IGG, IGM, IGA
Anticardiolipin IgG: <7 — ABNORMAL LOW (ref <11)
Anticardiolipin IgM: 37 (ref <10)

## 2011-06-15 LAB — LIPID PANEL
Cholesterol: 137
HDL: 13 — ABNORMAL LOW
Total CHOL/HDL Ratio: 10.5
Triglycerides: 104

## 2011-06-15 LAB — RPR: RPR Ser Ql: NONREACTIVE

## 2011-06-15 LAB — BETA-2-GLYCOPROTEIN I ABS, IGG/M/A
Beta-2 Glyco I IgG: <4 U/mL (ref <20)
Beta-2-Glycoprotein I IgA: 5 U/mL (ref <10)
Beta-2-Glycoprotein I IgA: 5 U/mL (ref <10)
Beta-2-Glycoprotein I IgM: <4 U/mL (ref <10)

## 2011-06-15 LAB — UIFE/LIGHT CHAINS/TP QN, 24-HR UR
Alpha 2, Urine: DETECTED — AB
Beta, Urine: DETECTED — AB
Free Lt Chn Excr Rate: 958.1
Gamma Globulin, Urine: DETECTED — AB
Total Protein, Urine-Ur/day: 1101 — ABNORMAL HIGH (ref 10–140)
Total Protein, Urine: 100.1

## 2011-06-15 LAB — IMMUNOFIXATION ADD-ON

## 2011-06-15 LAB — PROTEIN C ACTIVITY: Protein C Activity: 102 % (ref 75–133)

## 2011-06-15 LAB — C-REACTIVE PROTEIN: CRP: 14.5 — ABNORMAL HIGH (ref <0.6)

## 2011-06-15 LAB — PROTEIN S ACTIVITY: Protein S Activity: 144 % — ABNORMAL HIGH (ref 69–129)

## 2011-06-15 LAB — IRON AND TIBC
Iron: 20 — ABNORMAL LOW
UIBC: 138

## 2011-06-15 LAB — COMPLEMENT, TOTAL: Compl, Total (CH50): 53 U/mL (ref 31–66)

## 2011-06-15 LAB — ANTITHROMBIN III: AntiThromb III Func: 125 — ABNORMAL HIGH (ref 76–126)

## 2011-06-15 LAB — APTT: aPTT: 42 — ABNORMAL HIGH

## 2011-06-15 LAB — IGG, IGA, IGM
IgA: 364
IgM, Serum: 148

## 2011-06-15 LAB — PROTIME-INR: INR: 1.1

## 2011-06-15 LAB — HEMOGLOBIN A1C: Hgb A1c MFr Bld: 5.3

## 2011-06-15 LAB — FACTOR 5 LEIDEN

## 2011-06-15 LAB — PROTHROMBIN GENE MUTATION

## 2011-06-15 LAB — TROPONIN I: Troponin I: 0.05

## 2011-06-15 LAB — PROTEIN C, TOTAL: Protein C, Total: 67 % — ABNORMAL LOW (ref 70–140)

## 2011-06-15 LAB — C3 COMPLEMENT: C3 Complement: 102

## 2011-06-15 LAB — TSH: TSH: 2.081

## 2011-06-17 NOTE — Telephone Encounter (Signed)
Last metabolic panel Sept 4th. She should have metabolic panel just before her next OV - renal insufficiency

## 2011-06-18 ENCOUNTER — Other Ambulatory Visit: Payer: Self-pay | Admitting: Internal Medicine

## 2011-06-18 DIAGNOSIS — I10 Essential (primary) hypertension: Secondary | ICD-10-CM

## 2011-06-18 DIAGNOSIS — M549 Dorsalgia, unspecified: Secondary | ICD-10-CM

## 2011-06-18 LAB — BASIC METABOLIC PANEL
CO2: 22
CO2: 27
Calcium: 8.3 — ABNORMAL LOW
Chloride: 105
Chloride: 107
GFR calc Af Amer: 49 — ABNORMAL LOW
Glucose, Bld: 106 — ABNORMAL HIGH
Potassium: 4.1
Sodium: 136
Sodium: 137

## 2011-06-18 LAB — CBC
Hemoglobin: 9.3 — ABNORMAL LOW
Hemoglobin: 9.5 — ABNORMAL LOW
MCHC: 32.9
MCHC: 33.3
MCV: 85.1
MCV: 86
RBC: 3.28 — ABNORMAL LOW
RBC: 3.35 — ABNORMAL LOW
WBC: 12.9 — ABNORMAL HIGH

## 2011-06-18 LAB — IRON: Iron: 164 — ABNORMAL HIGH

## 2011-06-20 ENCOUNTER — Other Ambulatory Visit: Payer: Self-pay | Admitting: *Deleted

## 2011-06-20 LAB — POCT I-STAT, CHEM 8
BUN: 51 — ABNORMAL HIGH
Calcium, Ion: 1.15
Chloride: 105
Creatinine, Ser: 1.7 — ABNORMAL HIGH
Glucose, Bld: 187 — ABNORMAL HIGH
HCT: 32 — ABNORMAL LOW
Hemoglobin: 10.9 — ABNORMAL LOW
Potassium: 4
Sodium: 141
TCO2: 27

## 2011-06-20 LAB — DIFFERENTIAL
Basophils Absolute: 0.2 — ABNORMAL HIGH
Basophils Relative: 1
Eosinophils Absolute: 0
Eosinophils Relative: 0
Monocytes Absolute: 0.6
Monocytes Relative: 5
Neutro Abs: 11.4 — ABNORMAL HIGH

## 2011-06-20 LAB — PROTIME-INR
INR: 0.9
Prothrombin Time: 12.6

## 2011-06-20 LAB — CBC
HCT: 32.2 — ABNORMAL LOW
Hemoglobin: 10.9 — ABNORMAL LOW
MCHC: 34
MCV: 92.1
Platelets: 293
RBC: 3.49 — ABNORMAL LOW
RDW: 15.2
WBC: 13.1 — ABNORMAL HIGH

## 2011-06-20 LAB — BASIC METABOLIC PANEL
BUN: 32 — ABNORMAL HIGH
CO2: 27
GFR calc non Af Amer: 38 — ABNORMAL LOW
Glucose, Bld: 78
Potassium: 4.2

## 2011-06-20 LAB — APTT: aPTT: 33

## 2011-06-20 MED ORDER — SIMVASTATIN 5 MG PO TABS: 5.0000 mg | ORAL_TABLET | Freq: Every day | ORAL | Status: DC

## 2011-06-25 ENCOUNTER — Ambulatory Visit (INDEPENDENT_AMBULATORY_CARE_PROVIDER_SITE_OTHER): Payer: Medicare Other | Admitting: Internal Medicine

## 2011-06-25 VITALS — BP 128/72 | HR 103 | Temp 97.5°F | Wt 198.0 lb

## 2011-06-25 DIAGNOSIS — S76319A Strain of muscle, fascia and tendon of the posterior muscle group at thigh level, unspecified thigh, initial encounter: Secondary | ICD-10-CM

## 2011-06-25 DIAGNOSIS — N3942 Incontinence without sensory awareness: Secondary | ICD-10-CM

## 2011-06-25 DIAGNOSIS — IMO0002 Reserved for concepts with insufficient information to code with codable children: Secondary | ICD-10-CM

## 2011-06-25 NOTE — Progress Notes (Signed)
  Subjective:    Patient ID: [name], female    DOB: July 04, 1941, 70 y.o.   MRN: [id number]  HPI [name] reepoorts that 2 weeks ago she started to do in bed exercises including adduction of the hip. After the first day she had pain in the right hip and thigh so that she has trouble walking and now is back to using her walker. She has tried robaxin which isn't helping. She has pain despite oxycontin, gabapentin, prednisone. She has no pain when sitting, just with weight bearing.   I have reviewed the patient's medical history in detail and updated the computerized patient record.    Review of Systems System review is negative for any constitutional, cardiac, pulmonary, GI or neuro symptoms or complaints other than as described in the HPI.     Objective:   Physical Exam Vital noted - stable Gen'l - WNWD overweight white woman Hip exam -no tenderness with internal or external rotation, adduction. No tenderness to pressure over the greater trochanter or AP pressure. Tender to palpation over the piriformis and gluteals.        Assessment & Plan:  Buttock pain - piriformis syndrome  Plan - direct to [email or web address] for exercise

## 2011-06-25 NOTE — Patient Instructions (Signed)
Pain in the buttock is muscle strain. No evidence of degenerative change in the hip joint itself.  Plan - use your computer to go to [email or web address] and search for piriformis syndrome and massage, or exercise, or stretching           No need for any new medications.

## 2011-07-09 ENCOUNTER — Other Ambulatory Visit (INDEPENDENT_AMBULATORY_CARE_PROVIDER_SITE_OTHER): Payer: Medicare Other

## 2011-07-09 DIAGNOSIS — I10 Essential (primary) hypertension: Secondary | ICD-10-CM

## 2011-07-09 DIAGNOSIS — M549 Dorsalgia, unspecified: Secondary | ICD-10-CM

## 2011-07-09 LAB — BASIC METABOLIC PANEL
CO2: 29 mEq/L (ref 19–32)
Calcium: 8.5 mg/dL (ref 8.4–10.5)
Chloride: 104 mEq/L (ref 96–112)
Creatinine, Ser: 1.8 mg/dL — ABNORMAL HIGH (ref 0.4–1.2)
Glucose, Bld: 74 mg/dL (ref 70–99)

## 2011-07-09 LAB — CBC
Hemoglobin: 9.3 — ABNORMAL LOW
MCHC: 34.7
MCV: 91.2
RBC: 2.94 — ABNORMAL LOW
RDW: 13.5

## 2011-07-09 LAB — DIFFERENTIAL
Basophils Absolute: 0
Basophils Relative: 0
Eosinophils Absolute: 0.3
Monocytes Absolute: 0.9 — ABNORMAL HIGH
Monocytes Relative: 8
Neutro Abs: 8 — ABNORMAL HIGH
Neutrophils Relative %: 75

## 2011-07-09 LAB — SEDIMENTATION RATE: Sed Rate: 26 mm/hr — ABNORMAL HIGH (ref 0–22)

## 2011-07-10 LAB — URINALYSIS, ROUTINE W REFLEX MICROSCOPIC
Glucose, UA: NEGATIVE
Hgb urine dipstick: NEGATIVE
Specific Gravity, Urine: 1.021
Urobilinogen, UA: 0.2
pH: 6.5

## 2011-07-10 LAB — CBC
HCT: 29.5 — ABNORMAL LOW
HCT: 32.8 — ABNORMAL LOW
Hemoglobin: 10.4 — ABNORMAL LOW
Hemoglobin: 11.4 — ABNORMAL LOW
MCHC: 34.8
MCHC: 34.9
MCHC: 35.1
Platelets: 229
RBC: 3.57 — ABNORMAL LOW
RDW: 12.4
RDW: 13.3
RDW: 13.3

## 2011-07-10 LAB — COMPREHENSIVE METABOLIC PANEL
ALT: 16
AST: 18
Albumin: 3.6
Alkaline Phosphatase: 67
Calcium: 8.5
GFR calc Af Amer: >60
Potassium: 4
Sodium: 137
Total Protein: 7.2

## 2011-07-10 LAB — HEMOGLOBIN A1C: Hgb A1c MFr Bld: 5.1

## 2011-07-10 LAB — BASIC METABOLIC PANEL
BUN: 10
BUN: 9
CO2: 28
CO2: 29
Calcium: 7.5 — ABNORMAL LOW
Calcium: 8.3 — ABNORMAL LOW
Creatinine, Ser: 0.61
GFR calc Af Amer: >60
GFR calc non Af Amer: >60
GFR calc non Af Amer: >60
Glucose, Bld: 107 — ABNORMAL HIGH
Glucose, Bld: 129 — ABNORMAL HIGH
Potassium: 3.6
Potassium: 3.9
Sodium: 133 — ABNORMAL LOW
Sodium: 135

## 2011-07-10 LAB — DIFFERENTIAL
Basophils Relative: 1
Eosinophils Absolute: 0.4
Eosinophils Relative: 5
Lymphs Abs: 2
Monocytes Absolute: 0.7
Monocytes Relative: 8

## 2011-07-10 LAB — PROTIME-INR: INR: 1

## 2011-07-10 LAB — TYPE AND SCREEN

## 2011-07-12 ENCOUNTER — Ambulatory Visit: Payer: Medicare Other | Admitting: Internal Medicine

## 2011-07-13 ENCOUNTER — Encounter: Payer: Self-pay | Admitting: Internal Medicine

## 2011-07-13 ENCOUNTER — Ambulatory Visit (INDEPENDENT_AMBULATORY_CARE_PROVIDER_SITE_OTHER): Payer: Medicare Other | Admitting: Internal Medicine

## 2011-07-13 DIAGNOSIS — N259 Disorder resulting from impaired renal tubular function, unspecified: Secondary | ICD-10-CM

## 2011-07-13 DIAGNOSIS — E785 Hyperlipidemia, unspecified: Secondary | ICD-10-CM

## 2011-07-13 DIAGNOSIS — E119 Type 2 diabetes mellitus without complications: Secondary | ICD-10-CM

## 2011-07-13 DIAGNOSIS — M316 Other giant cell arteritis: Secondary | ICD-10-CM

## 2011-07-15 ENCOUNTER — Encounter: Payer: Self-pay | Admitting: Internal Medicine

## 2011-07-15 NOTE — Assessment & Plan Note (Signed)
Last sed rate mildly elevated - no indication for changing regimen at this time.

## 2011-07-15 NOTE — Assessment & Plan Note (Signed)
Reviewed labs for last year - she has had chronic elevation in Creatinine: she is scheduled to see nephrology for evaluation and guidance on diet and medications to preserve renal function.

## 2011-07-15 NOTE — Assessment & Plan Note (Signed)
No recent lipid panel. Last while at [facility] Dec. '11: LDL 84, HDL 28

## 2011-07-15 NOTE — Assessment & Plan Note (Signed)
Lab Results  A1C 5.3 % October - doing well.  Plan - continue present medication.

## 2011-07-15 NOTE — Progress Notes (Signed)
  Subjective:    Patient ID: [name], female    DOB: 02/16/1941, 70 y.o.   MRN: [id number]  HPI [name] presentw with concern about recent lipid profile, as well as her labs in general. Sed rate was also recently check and was only mildly elevated She has had no major medical changes iin the interval since her last visit. She is feeling relatively well.  Past Medical History  Diagnosis Date  . History of breast cancer   . Osteoporosis   . Idiopathic pulmonary fibrosis   . Bronchiectasis   . Vitamin D deficiency   . Hyperlipidemia   . Allergic rhinitis   . GERD (gastroesophageal reflux disease)   . Left knee DJD   . Anxiety   . Migraine   . Diverticulosis of colon   . Seizure disorder   . Temporal arteritis     Right eye blind, on steroids per [facility]/ Dr [name]  . CVA (cerebral infarction) 10/2007    Right thalamic   . Type II or unspecified type diabetes mellitus without mention of complication, not stated as uncontrolled     2nd to steriods  . PMR (polymyalgia rheumatica)   . Anemia   . Gait disorder   . Peripheral neuropathy   . HTN (hypertension)   . Renal insufficiency    Past Surgical History  Procedure Date  . Mastectomy   . Cholecystectomy   . Total knee arthroplasty     Left  . Tonsillectomy   . Cataract extraction     OS - Summer 2010  . Lumbar fusion 04/2010    W/Mechanical fixation - [facility]  . Spinal fusion 07/2010    T10-L2 interbody fusion / [facility]   Family History  Problem Relation Age of Onset  . Brain cancer Mother   . Hypertension Mother   . Dementia Father   . Hypertension Father   . Breast cancer Sister    History   Social History  . Marital Status: Widowed    Spouse Name: N/A    Number of Children: 1  . Years of Education: N/A   Occupational History  . Land    Social History Main Topics  . Smoking status: Never Smoker   . Smokeless tobacco: Never Used    . Alcohol Use: No     heavy drinker until 1995 - sobriety with AA  . Drug Use: No  . Sexually Active: Not Currently   Other Topics Concern  . Not on file   Social History Narrative      Married - husband invalid with stroke - widowed in 2009. 1 chil. 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 ACP - not formerly discussed - wishes to be a full code.       Review of Systems System review is negative for any constitutional, cardiac, pulmonary, GI or neuro symptoms or complaints other than as described in the HPI.    Objective:   Physical Exam Vitals reviewed - BP ok, overweight Gen'l - overweight white woman in no acute distress HEENT C&S clear Neck- supple with full ROM, no thyromegaly. Chest - good breath sounds w/o rales or wheezes Cor - RRR Abdomen- obese Extremity - w/o deformity Neuro - A&O x 3, unassisted ambulation using walker.        Assessment & Plan:

## 2011-08-07 ENCOUNTER — Encounter: Payer: Self-pay | Admitting: Internal Medicine

## 2011-08-07 ENCOUNTER — Ambulatory Visit (INDEPENDENT_AMBULATORY_CARE_PROVIDER_SITE_OTHER): Payer: Medicare Other | Admitting: Internal Medicine

## 2011-08-07 VITALS — BP 106/60 | HR 78 | Temp 98.2°F | Wt 202.2 lb

## 2011-08-07 DIAGNOSIS — Z4802 Encounter for removal of sutures: Secondary | ICD-10-CM

## 2011-08-11 NOTE — Progress Notes (Signed)
  Subjective:    Patient ID: [name], female    DOB: [birth year], 70 y.o.   MRN: [id number]  HPI [name] sustained a gash to her distal right lower extremity with a suture closure done at ED. She presents for suture removal.  I have reviewed the patient's medical history in detail and updated the computerized patient record.    Review of Systems System review is negative for any constitutional, cardiac, pulmonary, GI or neuro symptoms or complaints other than as described in the HPI.     Objective:   Physical Exam Vitals stable Derm - a large irregular gash with incomplete approximation of skin edges due to tissue damage. There is mild swelling of the lateral aspect of the wound but it is not hot to touch, fluctuant or draining. There is serosanguinous drainage from the open aspects of the wound.  Sutures were removed without difficulty or complication       Assessment & Plan:  Wound care - gentle washing with soap and water, apply non-adherent dressing to the wound and hold in place with kerlex. Return for fever, purulent drainage, pain.

## 2011-09-04 ENCOUNTER — Ambulatory Visit (INDEPENDENT_AMBULATORY_CARE_PROVIDER_SITE_OTHER): Payer: Medicare Other | Admitting: Internal Medicine

## 2011-09-04 ENCOUNTER — Encounter: Payer: Self-pay | Admitting: Internal Medicine

## 2011-09-04 DIAGNOSIS — E119 Type 2 diabetes mellitus without complications: Secondary | ICD-10-CM

## 2011-09-04 DIAGNOSIS — F411 Generalized anxiety disorder: Secondary | ICD-10-CM

## 2011-09-04 DIAGNOSIS — M549 Dorsalgia, unspecified: Secondary | ICD-10-CM

## 2011-09-04 DIAGNOSIS — I10 Essential (primary) hypertension: Secondary | ICD-10-CM

## 2011-09-04 DIAGNOSIS — Z981 Arthrodesis status: Secondary | ICD-10-CM | POA: Insufficient documentation

## 2011-09-04 MED ORDER — OXYCODONE HCL 15 MG PO TB12: 15.0000 mg | ORAL_TABLET | Freq: Two times a day (BID) | ORAL | Status: DC

## 2011-09-06 NOTE — Assessment & Plan Note (Signed)
Lab Results  Component Value Date   HGBA1C  Value: 5.3 01/13/2009  Last serum glucose Sept 4,'12 - 69

## 2011-09-06 NOTE — Assessment & Plan Note (Signed)
Back pain and other associated pains are adequately controlled. She is provided refill for oxycontin 15. She will return late December for next Rx to be reduced to oxycontin 10mg .

## 2011-09-06 NOTE — Progress Notes (Signed)
  Subjective:    Patient ID: [name], female    DOB: 1941-02-11, 70 y.o.   MRN: [id number]  HPI [name] presents for pain medication refill. In the interval since her last visit the gash on her right leg has made significant progress in healing. There remains as area about 2 cm in diameter that is not fully healed just below the right knee. She was able to travel out to [place] to visit family over Thanksgiving which she enjoyed. She has had no new medical problems, no further falls and is doing well.  She continues to have an interest in reducing her med dose from 15 to 10 mg oxycontin but wants to continue at 15mg  for the holiday season for fear that she will have to much pain to travel and enjoy time with her family.  I have reviewed the patient's medical history in detail and updated the computerized patient record.    Review of Systems  Constitutional: Negative.   HENT: Negative.   Respiratory: Negative.   Cardiovascular: Negative.   Gastrointestinal: Negative.   Musculoskeletal: Positive for back pain, joint swelling and arthralgias.  Skin: Positive for wound.  Neurological: Negative.   Psychiatric/Behavioral: Negative.        Objective:   Physical Exam Vitals reviewed - normal Gen'l - heavy set white woman in no distress Pulm- normal respirations Derm - wound right knee 2 cm across with good granulation tissue, no drainage, no surrounding erythema.       Assessment & Plan:

## 2011-09-06 NOTE — Assessment & Plan Note (Signed)
BP Readings from Last 3 Encounters:  09/04/11 120/76  08/07/11 106/60  07/13/11 122/66   Good control

## 2011-09-06 NOTE — Assessment & Plan Note (Signed)
Doing very well at this time.

## 2011-09-25 HISTORY — PX: LUMBAR FUSION: SHX111

## 2011-09-26 DIAGNOSIS — N179 Acute kidney failure, unspecified: Secondary | ICD-10-CM | POA: Diagnosis not present

## 2011-09-26 DIAGNOSIS — N2581 Secondary hyperparathyroidism of renal origin: Secondary | ICD-10-CM | POA: Diagnosis not present

## 2011-09-26 DIAGNOSIS — D509 Iron deficiency anemia, unspecified: Secondary | ICD-10-CM | POA: Diagnosis not present

## 2011-09-26 DIAGNOSIS — I10 Essential (primary) hypertension: Secondary | ICD-10-CM | POA: Diagnosis not present

## 2011-09-26 DIAGNOSIS — N182 Chronic kidney disease, stage 2 (mild): Secondary | ICD-10-CM | POA: Diagnosis not present

## 2011-10-01 DIAGNOSIS — N179 Acute kidney failure, unspecified: Secondary | ICD-10-CM | POA: Diagnosis not present

## 2011-10-01 DIAGNOSIS — I10 Essential (primary) hypertension: Secondary | ICD-10-CM | POA: Diagnosis not present

## 2011-10-01 DIAGNOSIS — N2581 Secondary hyperparathyroidism of renal origin: Secondary | ICD-10-CM | POA: Diagnosis not present

## 2011-10-03 ENCOUNTER — Telehealth: Payer: Self-pay

## 2011-10-03 DIAGNOSIS — M316 Other giant cell arteritis: Secondary | ICD-10-CM

## 2011-10-03 NOTE — Telephone Encounter (Signed)
Pt called requesting lab order for sed rate. Pt states that this was previously discussed with MD. Faythe Ghee to enter?

## 2011-10-03 NOTE — Telephone Encounter (Signed)
Order entered

## 2011-10-04 NOTE — Telephone Encounter (Signed)
Pt advised and is requesting 3 mth refill of Oxycontin 10 mg. Okay to refill?

## 2011-10-05 ENCOUNTER — Other Ambulatory Visit (INDEPENDENT_AMBULATORY_CARE_PROVIDER_SITE_OTHER): Payer: Medicare Other

## 2011-10-05 DIAGNOSIS — M316 Other giant cell arteritis: Secondary | ICD-10-CM | POA: Diagnosis not present

## 2011-10-05 LAB — SEDIMENTATION RATE: Sed Rate: 26 mm/hr — ABNORMAL HIGH (ref 0–22)

## 2011-10-05 MED ORDER — OXYCODONE HCL 15 MG PO TB12: 15.0000 mg | ORAL_TABLET | Freq: Two times a day (BID) | ORAL | Status: DC

## 2011-10-05 NOTE — Telephone Encounter (Signed)
Rx[s] printed awaiting MD signature.

## 2011-10-05 NOTE — Telephone Encounter (Signed)
Ok to prepare refill on oxycontin for signature. Thanks

## 2011-10-08 ENCOUNTER — Other Ambulatory Visit: Payer: Self-pay | Admitting: *Deleted

## 2011-10-08 ENCOUNTER — Telehealth: Payer: Self-pay | Admitting: *Deleted

## 2011-10-08 MED ORDER — OXYCODONE HCL 10 MG PO TB12: 10.0000 mg | ORAL_TABLET | Freq: Two times a day (BID) | ORAL | Status: AC

## 2011-10-08 MED ORDER — OXYCODONE HCL 10 MG PO TB12: 10.0000 mg | ORAL_TABLET | Freq: Two times a day (BID) | ORAL | Status: DC

## 2011-10-08 MED ORDER — OXYCODONE HCL 15 MG PO TB12: 15.0000 mg | ORAL_TABLET | Freq: Two times a day (BID) | ORAL | Status: DC

## 2011-10-08 NOTE — Telephone Encounter (Signed)
Refill request for oxycontin 10mg . Pt would like to decrease from 15mg -10mg . Per MD OK to refill.

## 2011-10-08 NOTE — Telephone Encounter (Signed)
Refill request oxycodone 10mg  with zero refills.

## 2011-10-09 ENCOUNTER — Telehealth: Payer: Self-pay

## 2011-10-09 NOTE — Telephone Encounter (Signed)
Done by Douglass Rivers [see new 01.14.13 phone note].?

## 2011-10-09 NOTE — Telephone Encounter (Signed)
Pt called requesting result of most recent sed rate. Pt c/o of mild pain in RT eye and is concerned that sed rate may be elevated.

## 2011-10-09 NOTE — Telephone Encounter (Signed)
Pt advised.

## 2011-10-09 NOTE — Telephone Encounter (Signed)
Sed rate 26- close to normal

## 2011-11-29 ENCOUNTER — Telehealth: Payer: Self-pay | Admitting: *Deleted

## 2011-11-29 DIAGNOSIS — M316 Other giant cell arteritis: Secondary | ICD-10-CM

## 2011-11-29 NOTE — Telephone Encounter (Signed)
Pt is req lab order for sed rate. Please advise.

## 2011-11-29 NOTE — Telephone Encounter (Signed)
Order entered

## 2011-12-03 ENCOUNTER — Telehealth: Payer: Self-pay | Admitting: *Deleted

## 2011-12-03 NOTE — Telephone Encounter (Signed)
Patient states she called last Thursday to get lab orders for a sed rate for her Prednisone.  She says she has not heard whether these orders have been sent or not.  Please advise.

## 2011-12-04 NOTE — Telephone Encounter (Signed)
Informed patient

## 2011-12-05 ENCOUNTER — Ambulatory Visit (INDEPENDENT_AMBULATORY_CARE_PROVIDER_SITE_OTHER): Payer: Medicare Other | Admitting: Internal Medicine

## 2011-12-05 ENCOUNTER — Other Ambulatory Visit (INDEPENDENT_AMBULATORY_CARE_PROVIDER_SITE_OTHER): Payer: Medicare Other

## 2011-12-05 ENCOUNTER — Encounter: Payer: Self-pay | Admitting: Internal Medicine

## 2011-12-05 ENCOUNTER — Other Ambulatory Visit: Payer: Self-pay | Admitting: *Deleted

## 2011-12-05 VITALS — BP 120/82 | HR 98 | Temp 98.4°F | Resp 14 | Wt 198.2 lb

## 2011-12-05 DIAGNOSIS — N182 Chronic kidney disease, stage 2 (mild): Secondary | ICD-10-CM

## 2011-12-05 DIAGNOSIS — F3289 Other specified depressive episodes: Secondary | ICD-10-CM

## 2011-12-05 DIAGNOSIS — E119 Type 2 diabetes mellitus without complications: Secondary | ICD-10-CM

## 2011-12-05 DIAGNOSIS — F32A Depression, unspecified: Secondary | ICD-10-CM

## 2011-12-05 DIAGNOSIS — F329 Major depressive disorder, single episode, unspecified: Secondary | ICD-10-CM | POA: Diagnosis not present

## 2011-12-05 DIAGNOSIS — M316 Other giant cell arteritis: Secondary | ICD-10-CM | POA: Diagnosis not present

## 2011-12-05 LAB — BASIC METABOLIC PANEL
CO2: 30 mEq/L (ref 19–32)
Calcium: 9.8 mg/dL (ref 8.4–10.5)
Creatinine, Ser: 1.6 mg/dL — ABNORMAL HIGH (ref 0.4–1.2)

## 2011-12-05 LAB — SEDIMENTATION RATE: Sed Rate: 19 mm/hr (ref 0–22)

## 2011-12-05 MED ORDER — OXYCODONE HCL 10 MG PO TB12: ORAL_TABLET | ORAL | Status: DC

## 2011-12-06 DIAGNOSIS — Z961 Presence of intraocular lens: Secondary | ICD-10-CM | POA: Diagnosis not present

## 2011-12-06 DIAGNOSIS — H04129 Dry eye syndrome of unspecified lacrimal gland: Secondary | ICD-10-CM | POA: Diagnosis not present

## 2011-12-06 DIAGNOSIS — H4720 Unspecified optic atrophy: Secondary | ICD-10-CM | POA: Diagnosis not present

## 2011-12-06 DIAGNOSIS — H5440 Blindness, one eye, unspecified eye: Secondary | ICD-10-CM | POA: Diagnosis not present

## 2011-12-09 DIAGNOSIS — F329 Major depressive disorder, single episode, unspecified: Secondary | ICD-10-CM | POA: Insufficient documentation

## 2011-12-09 DIAGNOSIS — F32A Depression, unspecified: Secondary | ICD-10-CM | POA: Insufficient documentation

## 2011-12-09 NOTE — Progress Notes (Signed)
Subjective:    Patient ID: [name], female    DOB: 1941/06/07, 71 y.o.   MRN: [id number]  HPI [name] presents for low energy and depressed mood. She has had no new medical illness but she carries a significant disease burden and has been under stress. She denies any ideation of self-harm.  Past Medical History  Diagnosis Date  . History of breast cancer   . Osteoporosis   . Idiopathic pulmonary fibrosis   . Bronchiectasis   . Vitamin d deficiency   . Hyperlipidemia   . Allergic rhinitis   . GERD (gastroesophageal reflux disease)   . Left knee DJD   . Anxiety   . Migraine   . Diverticulosis of colon   . Seizure disorder   . Temporal arteritis     Right eye blind, on steroids per [facility]/ Dr [name]  . CVA (cerebral infarction) 10/2007    Right thalamic   . Type II or unspecified type diabetes mellitus without mention of complication, not stated as uncontrolled     2nd to steriods  . PMR (polymyalgia rheumatica)   . Anemia   . Gait disorder   . Peripheral neuropathy   . HTN (hypertension)   . Renal insufficiency    Past Surgical History  Procedure Date  . Mastectomy   . Cholecystectomy   . Total knee arthroplasty     Left  . Tonsillectomy   . Cataract extraction     OS - [name]  . Lumbar fusion 04/2010    W/Mechanical fixation - [facility]  . Spinal fusion 07/2010    T10-L2 interbody fusion / [facility]   Family History  Problem Relation Age of Onset  . Brain cancer Mother   . Hypertension Mother   . Dementia Father   . Hypertension Father   . Breast cancer Sister    History   Social History  . Marital Status: Widowed    Spouse Name: N/A    Number of Children: 1  . Years of Education: N/A   Occupational History  . Land    Social History Main Topics  . Smoking status: Never Smoker   . Smokeless tobacco: Never Used  . Alcohol Use: No     heavy drinker until 1995 - sobriety  with AA  . Drug Use: No  . Sexually Active: Not Currently   Other Topics Concern  . Not on file   Social History Narrative      Married - husband invalid with stroke - widowed in 2009. 1 chil. 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 ACP - not formerly discussed - wishes to be a full code.        Review of Systems System review is negative for any constitutional, cardiac, pulmonary, GI or neuro symptoms or complaints other than as described in the HPI.     Objective:   Physical Exam Filed Vitals:   12/05/11 1551  BP: 120/82  Pulse: 98  Temp: 98.4 F (36.9 C)  Resp: 14   Wt Readings from Last 3 Encounters:  12/05/11 198 lb 4 oz (89.926 kg)  09/04/11 203 lb 8 oz (92.307 kg)  08/07/11 202 lb 4 oz (91.74 kg)   Gen'l- overweight white woman in no acute distress. HEENT - C&S clear Cor - RRR Pulm - normal respirations Psych - depressed mood, anhedonia, worry with perseveration, change in appetite, emotional  Assessment & Plan:

## 2011-12-09 NOTE — Assessment & Plan Note (Signed)
Mrs. Killoren with multiple vegative signs of depression. Discussed with her management options. She is opposed to starting any additional medications.  Plan - referral to [facility] # for appointment provided.

## 2011-12-13 ENCOUNTER — Other Ambulatory Visit: Payer: Self-pay

## 2011-12-13 ENCOUNTER — Inpatient Hospital Stay (HOSPITAL_COMMUNITY)
Admission: EM | Admit: 2011-12-13 | Discharge: 2011-12-16 | DRG: 378 | Disposition: A | Discharge status: Home / Self Care | Payer: Medicare Other | Attending: Internal Medicine | Admitting: Internal Medicine

## 2011-12-13 ENCOUNTER — Encounter (HOSPITAL_COMMUNITY): Payer: Self-pay | Admitting: *Deleted

## 2011-12-13 DIAGNOSIS — K5730 Diverticulosis of large intestine without perforation or abscess without bleeding: Secondary | ICD-10-CM | POA: Diagnosis present

## 2011-12-13 DIAGNOSIS — I4510 Unspecified right bundle-branch block: Secondary | ICD-10-CM | POA: Diagnosis present

## 2011-12-13 DIAGNOSIS — IMO0002 Reserved for concepts with insufficient information to code with codable children: Secondary | ICD-10-CM

## 2011-12-13 DIAGNOSIS — F329 Major depressive disorder, single episode, unspecified: Secondary | ICD-10-CM | POA: Diagnosis present

## 2011-12-13 DIAGNOSIS — M316 Other giant cell arteritis: Secondary | ICD-10-CM | POA: Diagnosis present

## 2011-12-13 DIAGNOSIS — K922 Gastrointestinal hemorrhage, unspecified: Secondary | ICD-10-CM

## 2011-12-13 DIAGNOSIS — F411 Generalized anxiety disorder: Secondary | ICD-10-CM | POA: Diagnosis present

## 2011-12-13 DIAGNOSIS — Z96659 Presence of unspecified artificial knee joint: Secondary | ICD-10-CM

## 2011-12-13 DIAGNOSIS — G40909 Epilepsy, unspecified, not intractable, without status epilepticus: Secondary | ICD-10-CM | POA: Diagnosis present

## 2011-12-13 DIAGNOSIS — Z860100 Personal history of colon polyps, unspecified: Secondary | ICD-10-CM

## 2011-12-13 DIAGNOSIS — E785 Hyperlipidemia, unspecified: Secondary | ICD-10-CM | POA: Diagnosis present

## 2011-12-13 DIAGNOSIS — Z981 Arthrodesis status: Secondary | ICD-10-CM

## 2011-12-13 DIAGNOSIS — R42 Dizziness and giddiness: Secondary | ICD-10-CM | POA: Diagnosis not present

## 2011-12-13 DIAGNOSIS — Z79899 Other long term (current) drug therapy: Secondary | ICD-10-CM

## 2011-12-13 DIAGNOSIS — Z8673 Personal history of transient ischemic attack (TIA), and cerebral infarction without residual deficits: Secondary | ICD-10-CM

## 2011-12-13 DIAGNOSIS — N182 Chronic kidney disease, stage 2 (mild): Secondary | ICD-10-CM | POA: Diagnosis present

## 2011-12-13 DIAGNOSIS — K5731 Diverticulosis of large intestine without perforation or abscess with bleeding: Principal | ICD-10-CM | POA: Diagnosis present

## 2011-12-13 DIAGNOSIS — J84112 Idiopathic pulmonary fibrosis: Secondary | ICD-10-CM | POA: Diagnosis present

## 2011-12-13 DIAGNOSIS — K625 Hemorrhage of anus and rectum: Secondary | ICD-10-CM | POA: Diagnosis present

## 2011-12-13 DIAGNOSIS — G609 Hereditary and idiopathic neuropathy, unspecified: Secondary | ICD-10-CM | POA: Diagnosis present

## 2011-12-13 DIAGNOSIS — I10 Essential (primary) hypertension: Secondary | ICD-10-CM | POA: Diagnosis present

## 2011-12-13 DIAGNOSIS — K219 Gastro-esophageal reflux disease without esophagitis: Secondary | ICD-10-CM | POA: Diagnosis present

## 2011-12-13 DIAGNOSIS — R000 Tachycardia, unspecified: Secondary | ICD-10-CM | POA: Diagnosis present

## 2011-12-13 DIAGNOSIS — D62 Acute posthemorrhagic anemia: Secondary | ICD-10-CM | POA: Diagnosis present

## 2011-12-13 DIAGNOSIS — D126 Benign neoplasm of colon, unspecified: Secondary | ICD-10-CM | POA: Diagnosis present

## 2011-12-13 DIAGNOSIS — E559 Vitamin D deficiency, unspecified: Secondary | ICD-10-CM | POA: Diagnosis present

## 2011-12-13 DIAGNOSIS — M353 Polymyalgia rheumatica: Secondary | ICD-10-CM | POA: Diagnosis present

## 2011-12-13 DIAGNOSIS — Z853 Personal history of malignant neoplasm of breast: Secondary | ICD-10-CM

## 2011-12-13 DIAGNOSIS — N189 Chronic kidney disease, unspecified: Secondary | ICD-10-CM | POA: Diagnosis not present

## 2011-12-13 DIAGNOSIS — J309 Allergic rhinitis, unspecified: Secondary | ICD-10-CM | POA: Diagnosis present

## 2011-12-13 DIAGNOSIS — R5381 Other malaise: Secondary | ICD-10-CM | POA: Diagnosis not present

## 2011-12-13 DIAGNOSIS — F3289 Other specified depressive episodes: Secondary | ICD-10-CM | POA: Diagnosis present

## 2011-12-13 DIAGNOSIS — M810 Age-related osteoporosis without current pathological fracture: Secondary | ICD-10-CM | POA: Diagnosis present

## 2011-12-13 DIAGNOSIS — R5383 Other fatigue: Secondary | ICD-10-CM | POA: Diagnosis not present

## 2011-12-13 DIAGNOSIS — G43909 Migraine, unspecified, not intractable, without status migrainosus: Secondary | ICD-10-CM | POA: Diagnosis present

## 2011-12-13 DIAGNOSIS — J479 Bronchiectasis, uncomplicated: Secondary | ICD-10-CM | POA: Diagnosis present

## 2011-12-13 DIAGNOSIS — Z7982 Long term (current) use of aspirin: Secondary | ICD-10-CM

## 2011-12-13 DIAGNOSIS — E119 Type 2 diabetes mellitus without complications: Secondary | ICD-10-CM | POA: Diagnosis present

## 2011-12-13 DIAGNOSIS — I129 Hypertensive chronic kidney disease with stage 1 through stage 4 chronic kidney disease, or unspecified chronic kidney disease: Secondary | ICD-10-CM | POA: Diagnosis present

## 2011-12-13 LAB — POCT I-STAT, CHEM 8
BUN: 39 mg/dL — ABNORMAL HIGH (ref 6–23)
Creatinine, Ser: 1.6 mg/dL — ABNORMAL HIGH (ref 0.50–1.10)
Sodium: 142 mEq/L (ref 135–145)
TCO2: 26 mmol/L (ref 0–100)

## 2011-12-13 LAB — PROTIME-INR: Prothrombin Time: 13.7 seconds (ref 11.6–15.2)

## 2011-12-13 LAB — COMPREHENSIVE METABOLIC PANEL
Albumin: 3.7 g/dL (ref 3.5–5.2)
BUN: 37 mg/dL — ABNORMAL HIGH (ref 6–23)
Creatinine, Ser: 1.66 mg/dL — ABNORMAL HIGH (ref 0.50–1.10)
Total Protein: 6.4 g/dL (ref 6.0–8.3)

## 2011-12-13 LAB — CBC
MCH: 32.1 pg (ref 26.0–34.0)
Platelets: 260 10*3/uL (ref 150–400)
RBC: 3.92 MIL/uL (ref 3.87–5.11)
WBC: 11.1 10*3/uL — ABNORMAL HIGH (ref 4.0–10.5)

## 2011-12-13 LAB — DIFFERENTIAL
Basophils Relative: 0 % (ref 0–1)
Eosinophils Absolute: 0.3 10*3/uL (ref 0.0–0.7)
Lymphs Abs: 1.6 10*3/uL (ref 0.7–4.0)
Neutrophils Relative %: 74 % (ref 43–77)

## 2011-12-13 MED ORDER — OXYCODONE HCL 10 MG PO TB12
10.0000 mg | ORAL_TABLET | Freq: Two times a day (BID) | ORAL | Status: DC
  Administered 2011-12-14 – 2011-12-16 (×5): 10 mg via ORAL
  Filled 2011-12-13 (×6): qty 1

## 2011-12-13 MED ORDER — FERROUS SULFATE 325 (65 FE) MG PO TABS
325.0000 mg | ORAL_TABLET | Freq: Every day | ORAL | Status: DC
  Administered 2011-12-14 – 2011-12-16 (×2): 325 mg via ORAL
  Filled 2011-12-13 (×3): qty 1

## 2011-12-13 MED ORDER — PANTOPRAZOLE SODIUM 40 MG IV SOLR
40.0000 mg | Freq: Two times a day (BID) | INTRAVENOUS | Status: DC
  Administered 2011-12-14 (×3): 40 mg via INTRAVENOUS
  Filled 2011-12-13 (×5): qty 40

## 2011-12-13 MED ORDER — SODIUM CHLORIDE 0.9 % IJ SOLN
3.0000 mL | Freq: Two times a day (BID) | INTRAMUSCULAR | Status: DC
  Administered 2011-12-14 – 2011-12-16 (×5): 3 mL via INTRAVENOUS

## 2011-12-13 MED ORDER — METHOCARBAMOL 750 MG PO TABS
750.0000 mg | ORAL_TABLET | Freq: Two times a day (BID) | ORAL | Status: DC
  Administered 2011-12-14 – 2011-12-16 (×5): 750 mg via ORAL
  Filled 2011-12-13 (×5): qty 1
  Filled 2011-12-13: qty 2
  Filled 2011-12-13: qty 1

## 2011-12-13 MED ORDER — ADULT MULTIVITAMIN W/MINERALS CH
1.0000 | ORAL_TABLET | Freq: Every day | ORAL | Status: DC
  Administered 2011-12-14 – 2011-12-16 (×2): 1 via ORAL
  Filled 2011-12-13 (×4): qty 1

## 2011-12-13 MED ORDER — PANTOPRAZOLE SODIUM 40 MG IV SOLR: 40.0000 mg | Freq: Once | INTRAVENOUS | Status: DC

## 2011-12-13 MED ORDER — FUROSEMIDE 40 MG PO TABS
40.0000 mg | ORAL_TABLET | Freq: Every day | ORAL | Status: DC
  Administered 2011-12-14 – 2011-12-16 (×2): 40 mg via ORAL
  Filled 2011-12-13 (×3): qty 1

## 2011-12-13 MED ORDER — VITAMIN D3 25 MCG (1000 UNIT) PO TABS
1000.0000 [IU] | ORAL_TABLET | Freq: Every day | ORAL | Status: DC
  Administered 2011-12-14 – 2011-12-16 (×2): 1000 [IU] via ORAL
  Filled 2011-12-13 (×3): qty 1

## 2011-12-13 MED ORDER — SODIUM CHLORIDE 0.9 % IV SOLN
INTRAVENOUS | Status: DC
  Administered 2011-12-14 (×2): via INTRAVENOUS

## 2011-12-13 MED ORDER — PREDNISONE 20 MG PO TABS: 10.0000 mg | ORAL_TABLET | ORAL | Status: DC

## 2011-12-13 MED ORDER — GABAPENTIN 300 MG PO CAPS
300.0000 mg | ORAL_CAPSULE | Freq: Two times a day (BID) | ORAL | Status: DC
  Administered 2011-12-14 – 2011-12-16 (×5): 300 mg via ORAL
  Filled 2011-12-13 (×7): qty 1

## 2011-12-13 MED ORDER — SIMVASTATIN 5 MG PO TABS
5.0000 mg | ORAL_TABLET | Freq: Every day | ORAL | Status: DC
  Administered 2011-12-14 – 2011-12-15 (×2): 5 mg via ORAL
  Filled 2011-12-13 (×3): qty 1

## 2011-12-13 MED ORDER — PREDNISONE 2.5 MG PO TABS
12.5000 mg | ORAL_TABLET | Freq: Every day | ORAL | Status: DC
  Administered 2011-12-14 – 2011-12-16 (×3): 12.5 mg via ORAL
  Filled 2011-12-13 (×3): qty 1

## 2011-12-13 MED ORDER — AMLODIPINE BESYLATE 10 MG PO TABS
10.0000 mg | ORAL_TABLET | Freq: Every day | ORAL | Status: DC
  Administered 2011-12-14 – 2011-12-16 (×3): 10 mg via ORAL
  Filled 2011-12-13 (×3): qty 1

## 2011-12-13 MED ORDER — PREDNISONE 2.5 MG PO TABS: 2.5000 mg | ORAL_TABLET | ORAL | Status: DC

## 2011-12-13 NOTE — ED Provider Notes (Signed)
History     CSN: [id number]  Arrival date & time 12/13/11  1905   First MD Initiated Contact with Patient 12/13/11 2155      Chief Complaint  Patient presents with  . Rectal Bleeding    (Consider location/radiation/quality/duration/timing/severity/associated sxs/prior treatment) HPI Patient with multiple episodes of rectal bleeding that began 4-5 hours prior to presentation here. Patient has on aspirin 3 times per week. She has been on naproxen since a dental procedure earlier this week. She has not had nausea or vomiting. She denies any past medical history significant for GI bleeding. She denies any abdominal pain. She is weak and slightly lightheaded. She denies any chest pain. She denies any shortness of breath Past Medical History  Diagnosis Date  . History of breast cancer   . Osteoporosis   . Idiopathic pulmonary fibrosis   . Bronchiectasis   . Vitamin d deficiency   . Hyperlipidemia   . Allergic rhinitis   . GERD (gastroesophageal reflux disease)   . Left knee DJD   . Anxiety   . Migraine   . Diverticulosis of colon   . Seizure disorder   . Temporal arteritis     Right eye blind, on steroids per [facility]/ Dr [name]  . CVA (cerebral infarction) 10/2007    Right thalamic   . Type II or unspecified type diabetes mellitus without mention of complication, not stated as uncontrolled     2nd to steriods  . PMR (polymyalgia rheumatica)   . Anemia   . Gait disorder   . Peripheral neuropathy   . HTN (hypertension)   . Renal insufficiency     Past Surgical History  Procedure Date  . Mastectomy   . Cholecystectomy   . Total knee arthroplasty     Left  . Tonsillectomy   . Cataract extraction     OS - Summer 2010  . Lumbar fusion 04/2010    W/Mechanical fixation - [facility]  . Spinal fusion 07/2010    T10-L2 interbody fusion / [facility]    Family History  Problem Relation Age of Onset  . Brain cancer Mother   . Hypertension Mother   . Dementia  Father   . Hypertension Father   . Breast cancer Sister     History  Substance Use Topics  . Smoking status: Never Smoker   . Smokeless tobacco: Never Used  . Alcohol Use: No     heavy drinker until 1995 - sobriety with AA    OB History    Grav Para Term Preterm Abortions TAB SAB Ect Mult Living                  Review of Systems  All other systems reviewed and are negative.    Allergies  Review of patient's allergies indicates no known allergies.  Home Medications   Current Outpatient Rx  Name Route Sig Dispense Refill  . AMLODIPINE BESYLATE 10 MG PO TABS Oral Take 1 tablet (10 mg total) by mouth daily. 90 tablet 3  . ASPIRIN 81 MG PO TBEC Oral Take 81 mg by mouth daily.      Marland Kitchen CALCIUM-MAGNESIUM-ZINC 1000-500-50 MG PO TABS Oral Take by mouth daily.      Marland Kitchen VITAMIN D 1000 UNITS PO TABS Oral Take 1,000 Units by mouth daily.      . DENOSUMAB 60 MG/ML [place] SOLN Subcutaneous Inject 60 mg into the skin every 6 (six) months.      Marland Kitchen FAMOTIDINE 20  MG PO TABS Oral Take 20 mg by mouth daily.     Marland Kitchen FERROUS SULFATE 325 (65 FE) MG PO TABS Oral Take 325 mg by mouth daily.     . FUROSEMIDE 40 MG PO TABS Oral Take 1 tablet (40 mg total) by mouth daily. 90 tablet 3  . GABAPENTIN 300 MG PO CAPS Oral Take 1 capsule (300 mg total) by mouth 2 (two) times daily. 180 capsule 3  . METHOCARBAMOL 750 MG PO TABS Oral Take 750 mg by mouth 2 (two) times daily.    . SUPER HIGH VITAMINS/MINERALS PO TABS Oral Take 1 tablet by mouth daily.      Marland Kitchen NAPROXEN SODIUM 550 MG PO TABS Oral Take 550 mg by mouth 3 (three) times daily with meals.    . OXYCODONE HCL ER 10 MG PO TB12  Take 10 mg by mouth 2 (two) times daily. Fill on or After March 04, 2012. 60 tablet 0  . OXYCODONE HCL 10 MG PO TABS Oral Take 1 tablet by mouth every 4 (four) hours as needed. Breakthrough pain.    Marland Kitchen PENICILLIN V POTASSIUM 500 MG PO TABS Oral Take 500 mg by mouth 4 (four) times daily.    Marland Kitchen PREDNISONE 10 MG PO TABS Oral Take 10 mg by mouth  See admin instructions. Take with 2.5 mg tablet for a dose of 12.5 daily    . PREDNISONE 2.5 MG PO TABS Oral Take 2.5 mg by mouth See admin instructions. Take with 10mg  for a dose of 12.5mg  daily    . SIMVASTATIN 5 MG PO TABS Oral Take 1 tablet (5 mg total) by mouth at bedtime. 90 tablet 3  . MG-PLUS PROTEIN 133 MG PO TABS Oral Take 1 tablet by mouth 2 (two) times daily.        BP 137/80  Pulse 107  Temp(Src) 98.5 F (36.9 C) (Oral)  Resp 20  Wt 198 lb (89.812 kg)  SpO2 94%  Physical Exam  Nursing note and vitals reviewed. Constitutional: She appears well-developed and well-nourished.  HENT:  Head: Normocephalic and atraumatic.  Eyes: Conjunctivae and EOM are normal. Pupils are equal, round, and reactive to light.  Neck: Normal range of motion. Neck supple.  Cardiovascular: Normal rate, regular rhythm, normal heart sounds and intact distal pulses.   Pulmonary/Chest: Effort normal and breath sounds normal.  Abdominal: Soft. Bowel sounds are normal. There is tenderness.       Mild epigastric tenderness with palpation rectal exam reveals bright red blood per  Musculoskeletal: Normal range of motion.  Neurological: She is alert.  Skin: Skin is warm and dry.  Psychiatric: She has a normal mood and affect. Thought content normal.    ED Course  Procedures (including critical care time)   Labs Reviewed  CBC  DIFFERENTIAL   No results found.   No diagnosis found.  Results for orders placed during the hospital encounter of 12/13/11  CBC      Component Value Range   WBC 11.1 (*) 4.0 - 10.5 (K/uL)   RBC 3.92  3.87 - 5.11 (MIL/uL)   Hemoglobin 12.6  12.0 - 15.0 (g/dL)   HCT 38.0  36.0 - 46.0 (%)   MCV 96.9  78.0 - 100.0 (fL)   MCH 32.1  26.0 - 34.0 (pg)   MCHC 33.2  30.0 - 36.0 (g/dL)   RDW 14.1  11.5 - 15.5 (%)   Platelets 260  150 - 400 (K/uL)  DIFFERENTIAL      Component  Value Range   Neutrophils Relative 74  43 - 77 (%)   Neutro Abs 8.3 (*) 1.7 - 7.7 (K/uL)    Lymphocytes Relative 14  12 - 46 (%)   Lymphs Abs 1.6  0.7 - 4.0 (K/uL)   Monocytes Relative 9  3 - 12 (%)   Monocytes Absolute 1.0  0.1 - 1.0 (K/uL)   Eosinophils Relative 3  0 - 5 (%)   Eosinophils Absolute 0.3  0.0 - 0.7 (K/uL)   Basophils Relative 0  0 - 1 (%)   Basophils Absolute 0.0  0.0 - 0.1 (K/uL)  COMPREHENSIVE METABOLIC PANEL      Component Value Range   Sodium 139  135 - 145 (mEq/L)   Potassium 3.7  3.5 - 5.1 (mEq/L)   Chloride 104  96 - 112 (mEq/L)   CO2 26  19 - 32 (mEq/L)   Glucose, Bld 97  70 - 99 (mg/dL)   BUN 37 (*) 6 - 23 (mg/dL)   Creatinine, Ser 1.66 (*) 0.50 - 1.10 (mg/dL)   Calcium 9.1  8.4 - 10.5 (mg/dL)   Total Protein 6.4  6.0 - 8.3 (g/dL)   Albumin 3.7  3.5 - 5.2 (g/dL)   AST 19  0 - 37 (U/L)   ALT 19  0 - 35 (U/L)   Alkaline Phosphatase 67  39 - 117 (U/L)   Total Bilirubin 0.5  0.3 - 1.2 (mg/dL)   GFR calc non Af Amer 30 (*) >90 (mL/min)   GFR calc Af Amer 35 (*) >90 (mL/min)  POCT I-STAT, CHEM 8      Component Value Range   Sodium 142  135 - 145 (mEq/L)   Potassium 3.8  3.5 - 5.1 (mEq/L)   Chloride 109  96 - 112 (mEq/L)   BUN 39 (*) 6 - 23 (mg/dL)   Creatinine, Ser 1.60 (*) 0.50 - 1.10 (mg/dL)   Glucose, Bld 99  70 - 99 (mg/dL)   Calcium, Ion 1.17  1.12 - 1.32 (mmol/L)   TCO2 26  0 - 100 (mmol/L)   Hemoglobin 12.6  12.0 - 15.0 (g/dL)   HCT 37.0  36.0 - 46.0 (%)     MDM   Date: 12/13/2011  Rate: 76  Rhythm: normal sinus rhythm  QRS Axis: left  Intervals: normal  ST/T Wave abnormalities: nonspecific ST changes  Conduction Disutrbances:right bundle branch block  Narrative Interpretation:   Old EKG Reviewed: unchanged  1 rectal bleeding Patient with blood pressure of 101/40 on my reevaluation. She's continued to have rectal bleeding. She's had 2 units of packed red blood cells typed and crossed to be transfused this is still pending. Her initial hemoglobin on presentation is normal but she has been tachycardic and now having a lower blood  pressure. Protonix 40 mg IV ordered. 2 renal insufficiency appears stable from previous 3 abnormal EKG right bundle branch block nonspecific ST changes appear stable from prior  CRITICAL CARE Performed by: Yeshua Stryker S   Total critical care time: 45  Critical care time was exclusive of separately billable procedures and treating other patients.  Critical care was necessary to treat or prevent imminent or life-threatening deterioration.  Critical care was time spent personally by me on the following activities: development of treatment plan with patient and/or surrogate as well as nursing, discussions with consultants, evaluation of patient's response to treatment, examination of patient, obtaining history from patient or surrogate, ordering and performing treatments and interventions, ordering and review of laboratory studies, ordering  and review of radiographic studies, pulse oximetry and re-evaluation of patient's condition.   Patient's care discussed with Dr. Charlies Silvers. Patient will be admitted to step down unit team 2.  Shaune Pollack, MD 12/13/11 [phone]

## 2011-12-13 NOTE — H&P (Addendum)
PCP:  Adella Hare, MD, MD   DOA:  12/13/2011  7:29 PM  Chief Complaint:  Rectal bleed  HPI: 71 year old female with history of HTN, diverticulosis, temporal arteritis ( on prednisone/by neurology), dyslipidemia who presented to ED with rectal bleed. Rectal bleed started earlier today and patient reported about 10 episodes of bleed with occasional BM. Patient reports blood fills toilet bowl. There are no reports of chest pain, no shortness of breath, no abdominal pain and no nausea or vomiting. Patient reports no previous history of bleeding per rectum. She does take aspirin at home but as mentioned never experienced such event. Patient at present feels fine and is hemodynamically stable. No LOC.   Assessment/Plan  Principal Problem:   *Rectal bleed - likely related to history of diverticulosis/ aspirin - we will repeat CBC Q 8 hours - hold aspirin and naproxen - patient was transfused in ED 1 unit PRBC due to an ongoing rectal bleed with every BM - start protonix 40 mg Q 12 Hours - at present hemoglobin is 12.6 and patient is hemodynamically stable - GI consult appreciated - they will see the patient in am  Active Problems:  Temporal arteritis - continue prednisone per home regimen  HYPERLIPIDEMIA - continue statin  HYPERTENSION - at goal  Chronic renal insufficiency, stage IV - continue to monitor  DVT Prophylaxis - SCD  Code Status - full code  Education  - test results and diagnostic studies were discussed with patient at the bedside - patient has verbalized the understanding - questions were answered at the bedside and contact information was provided for additional questions or concerns  Disposition - to step - called office of Dr. Linda Hedges to inform of patient's admission   Allergies: No Known Allergies  Prior to Admission medications   Medication Sig Start Date End Date Taking? Authorizing Provider  amLODipine (NORVASC) 10 MG tablet Take 1 tablet (10  mg total) by mouth daily. 06/05/11 06/04/12 Yes Neena Rhymes, MD  aspirin 81 MG EC tablet Take 81 mg by mouth daily.     Yes Historical Provider, MD  Calcium-Magnesium-Zinc 1000-500-50 MG TABS Take by mouth daily.     Yes Historical Provider, MD  cholecalciferol (VITAMIN D) 1000 UNITS tablet Take 1,000 Units by mouth daily.     Yes Historical Provider, MD  denosumab (PROLIA) 60 MG/ML SOLN Inject 60 mg into the skin every 6 (six) months.     Yes Historical Provider, MD  famotidine (PEPCID) 20 MG tablet Take 20 mg by mouth daily.    Yes Historical Provider, MD  ferrous sulfate 325 (65 FE) MG tablet Take 325 mg by mouth daily.    Yes Historical Provider, MD  furosemide (LASIX) 40 MG tablet Take 1 tablet (40 mg total) by mouth daily. 06/05/11  Yes Neena Rhymes, MD  gabapentin (NEURONTIN) 300 MG capsule Take 1 capsule (300 mg total) by mouth 2 (two) times daily. 06/05/11  Yes Neena Rhymes, MD  methocarbamol (ROBAXIN) 750 MG tablet Take 750 mg by mouth 2 (two) times daily. 06/05/11  Yes Neena Rhymes, MD  Multiple Vitamins-Minerals (MULTIVITAMIN,TX-MINERALS) tablet Take 1 tablet by mouth daily.     Yes Historical Provider, MD  naproxen sodium (ANAPROX) 550 MG tablet Take 550 mg by mouth 3 (three) times daily with meals.   Yes Historical Provider, MD  oxyCODONE (OXYCONTIN) 10 MG 12 hr tablet Take 10 mg by mouth 2 (two) times daily. Fill on or After March 04, 2012.  12/05/11  Yes Neena Rhymes, MD  Oxycodone HCl 10 MG TABS Take 1 tablet by mouth every 4 (four) hours as needed. Breakthrough pain.   Yes Historical Provider, MD  penicillin v potassium (VEETID) 500 MG tablet Take 500 mg by mouth 4 (four) times daily. 12/10/11 12/17/11 Yes Historical Provider, MD  predniSONE (DELTASONE) 10 MG tablet Take 10 mg by mouth See admin instructions. Take with 2.5 mg tablet for a dose of 12.5 daily 06/05/11  Yes Neena Rhymes, MD  predniSONE (DELTASONE) 2.5 MG tablet Take 2.5 mg by mouth See admin instructions.  Take with 10mg  for a dose of 12.5mg  daily 06/05/11  Yes Neena Rhymes, MD  simvastatin (ZOCOR) 5 MG tablet Take 1 tablet (5 mg total) by mouth at bedtime. 06/20/11  Yes Neena Rhymes, MD  Specialty Vitamins Products (MAGNESIUM, AMINO ACID CHELATE,) 133 MG tablet Take 1 tablet by mouth 2 (two) times daily.     Yes Historical Provider, MD    Past Medical History  Diagnosis Date  . History of breast cancer   . Osteoporosis   . Idiopathic pulmonary fibrosis   . Bronchiectasis   . Vitamin d deficiency   . Hyperlipidemia   . Allergic rhinitis   . GERD (gastroesophageal reflux disease)   . Left knee DJD   . Anxiety   . Migraine   . Diverticulosis of colon   . Seizure disorder   . Temporal arteritis     Right eye blind, on steroids per [facility]/ Dr [name]  . CVA (cerebral infarction) 10/2007    Right thalamic   . Type II or unspecified type diabetes mellitus without mention of complication, not stated as uncontrolled     2nd to steriods  . PMR (polymyalgia rheumatica)   . Anemia   . Gait disorder   . Peripheral neuropathy   . HTN (hypertension)   . Renal insufficiency     Past Surgical History  Procedure Date  . Mastectomy   . Cholecystectomy   . Total knee arthroplasty     Left  . Tonsillectomy   . Cataract extraction     OS - Summer 2010  . Lumbar fusion 04/2010    W/Mechanical fixation - [facility]  . Spinal fusion 07/2010    T10-L2 interbody fusion / [place] History:  reports that she has never smoked. She has never used smokeless tobacco. She reports that she does not drink alcohol or use illicit drugs.  Family History  Problem Relation Age of Onset  . Brain cancer Mother   . Hypertension Mother   . Dementia Father   . Hypertension Father   . Breast cancer Sister     Review of Systems:  Constitutional: Denies fever, chills, diaphoresis, appetite change and fatigue.  HEENT: Denies photophobia, eye pain, redness, hearing loss,  ear pain, congestion, sore throat, rhinorrhea, sneezing, mouth sores, trouble swallowing, neck pain, neck stiffness and tinnitus.   Respiratory: Denies SOB, DOE, cough, chest tightness,  and wheezing.   Cardiovascular: Denies chest pain, palpitations and leg swelling.  Gastrointestinal: as per HPI  Genitourinary: Denies dysuria, urgency, frequency, hematuria, flank pain and difficulty urinating.  Musculoskeletal: Denies myalgias, back pain, joint swelling, arthralgias and gait problem.  Skin: Denies pallor, rash and wound.  Neurological: Denies dizziness, seizures, syncope, weakness, light-headedness, numbness and headaches.  Hematological: Denies adenopathy. Easy bruising, personal or family bleeding history  Psychiatric/Behavioral: Denies suicidal ideation, mood changes, confusion, nervousness, sleep disturbance  and agitation   Physical Exam:  Filed Vitals:   12/13/11 1931 12/13/11 2242 12/13/11 2245 12/13/11 2331  BP: 137/80 132/79  127/67  Pulse: 107 78 79 76  Temp: 98.5 F (36.9 C)   98.3 F (36.8 C)  TempSrc: Oral   Oral  Resp: 20 16 16 16   Weight: 89.812 kg (198 lb)     SpO2: 94% 99% 97% 98%    Constitutional: Vital signs reviewed.  Patient is in no acute distress and cooperative with exam. Alert and oriented x3.  Head: Normocephalic and atraumatic Ear: TM normal bilaterally Mouth: no erythema or exudates, MMM Eyes: PERRL, EOMI, conjunctivae normal, No scleral icterus.  Neck: Supple, Trachea midline normal ROM, No JVD, mass, thyromegaly, or carotid bruit present.  Cardiovascular: RRR, S1 normal, S2 normal, no MRG, pulses symmetric and intact bilaterally Pulmonary/Chest: CTAB, no wheezes, rales, or rhonchi Abdominal: Soft. Non-tender, non-distended, bowel sounds are normal, no masses, organomegaly, or guarding present.  GU: no CVA tenderness Musculoskeletal: No joint deformities, erythema, or stiffness, ROM full and no nontender Ext: no edema and no cyanosis, pulses  palpable bilaterally (DP and PT) Hematology: no cervical, inginal, or axillary adenopathy.  Neurological: A&O x3, Strenght is normal and symmetric bilaterally, cranial nerve II-XII are grossly intact, no focal motor deficit, sensory intact to light touch bilaterally.  Skin: Warm, dry and intact. No rash, cyanosis, or clubbing.  Psychiatric: Normal mood and affect. speech and behavior is normal. Judgment and thought content normal. Cognition and memory are normal.   Labs on Admission:  Results for orders placed during the hospital encounter of 12/13/11 (from the past 48 hour(s))  CBC     Status: Abnormal   Collection Time   12/13/11  9:40 PM      Component Value Range Comment   WBC 11.1 (*) 4.0 - 10.5 (K/uL)    RBC 3.92  3.87 - 5.11 (MIL/uL)    Hemoglobin 12.6  12.0 - 15.0 (g/dL)    HCT 38.0  36.0 - 46.0 (%)    MCV 96.9  78.0 - 100.0 (fL)    MCH 32.1  26.0 - 34.0 (pg)    MCHC 33.2  30.0 - 36.0 (g/dL)    RDW 14.1  11.5 - 15.5 (%)    Platelets 260  150 - 400 (K/uL)   DIFFERENTIAL     Status: Abnormal   Collection Time   12/13/11  9:40 PM      Component Value Range Comment   Neutrophils Relative 74  43 - 77 (%)    Neutro Abs 8.3 (*) 1.7 - 7.7 (K/uL)    Lymphocytes Relative 14  12 - 46 (%)    Lymphs Abs 1.6  0.7 - 4.0 (K/uL)    Monocytes Relative 9  3 - 12 (%)    Monocytes Absolute 1.0  0.1 - 1.0 (K/uL)    Eosinophils Relative 3  0 - 5 (%)    Eosinophils Absolute 0.3  0.0 - 0.7 (K/uL)    Basophils Relative 0  0 - 1 (%)    Basophils Absolute 0.0  0.0 - 0.1 (K/uL)   COMPREHENSIVE METABOLIC PANEL     Status: Abnormal   Collection Time   12/13/11  9:40 PM      Component Value Range Comment   Sodium 139  135 - 145 (mEq/L)    Potassium 3.7  3.5 - 5.1 (mEq/L)    Chloride 104  96 - 112 (mEq/L)    CO2 26  19 -  32 (mEq/L)    Glucose, Bld 97  70 - 99 (mg/dL)    BUN 37 (*) 6 - 23 (mg/dL)    Creatinine, Ser 1.66 (*) 0.50 - 1.10 (mg/dL)    Calcium 9.1  8.4 - 10.5 (mg/dL)    Total Protein 6.4   6.0 - 8.3 (g/dL)    Albumin 3.7  3.5 - 5.2 (g/dL)    AST 19  0 - 37 (U/L)    ALT 19  0 - 35 (U/L)    Alkaline Phosphatase 67  39 - 117 (U/L)    Total Bilirubin 0.5  0.3 - 1.2 (mg/dL)    GFR calc non Af Amer 30 (*) >90 (mL/min)    GFR calc Af Amer 35 (*) >90 (mL/min)   PROTIME-INR     Status: Normal   Collection Time   12/13/11  9:40 PM      Component Value Range Comment   Prothrombin Time 13.7  11.6 - 15.2 (seconds)    INR 1.03  0.00 - 1.49    APTT     Status: Normal   Collection Time   12/13/11  9:40 PM      Component Value Range Comment   aPTT 35  24 - 37 (seconds)   POCT I-STAT, CHEM 8     Status: Abnormal   Collection Time   12/13/11 10:05 PM      Component Value Range Comment   Sodium 142  135 - 145 (mEq/L)    Potassium 3.8  3.5 - 5.1 (mEq/L)    Chloride 109  96 - 112 (mEq/L)    BUN 39 (*) 6 - 23 (mg/dL)    Creatinine, Ser 1.60 (*) 0.50 - 1.10 (mg/dL)    Glucose, Bld 99  70 - 99 (mg/dL)    Calcium, Ion 1.17  1.12 - 1.32 (mmol/L)    TCO2 26  0 - 100 (mmol/L)    Hemoglobin 12.6  12.0 - 15.0 (g/dL)    HCT 37.0  36.0 - 46.0 (%)   TYPE AND SCREEN     Status: Normal (Preliminary result)   Collection Time   12/13/11 10:33 PM      Component Value Range Comment   ABO/RH(D) O POS      Antibody Screen PENDING      Sample Expiration 12/16/2011        Time Spent on Admission: Over 30 minutes  Johnathon Mittal 12/13/2011, 11:35 PM  [facility] Pager # [phone] Main Office # [phone]

## 2011-12-13 NOTE — ED Notes (Signed)
Pt in c/o rectal bleeding x1 day, pt states she has had multiple episode of bright red rectal bleeding today, noted clots in blood, states she was recently started on new medication, naproxen since monday

## 2011-12-14 DIAGNOSIS — Z79899 Other long term (current) drug therapy: Secondary | ICD-10-CM | POA: Diagnosis not present

## 2011-12-14 DIAGNOSIS — K219 Gastro-esophageal reflux disease without esophagitis: Secondary | ICD-10-CM | POA: Diagnosis present

## 2011-12-14 DIAGNOSIS — K922 Gastrointestinal hemorrhage, unspecified: Secondary | ICD-10-CM

## 2011-12-14 DIAGNOSIS — K921 Melena: Secondary | ICD-10-CM | POA: Diagnosis not present

## 2011-12-14 DIAGNOSIS — Z1211 Encounter for screening for malignant neoplasm of colon: Secondary | ICD-10-CM | POA: Diagnosis not present

## 2011-12-14 DIAGNOSIS — K5731 Diverticulosis of large intestine without perforation or abscess with bleeding: Secondary | ICD-10-CM | POA: Diagnosis not present

## 2011-12-14 DIAGNOSIS — J84112 Idiopathic pulmonary fibrosis: Secondary | ICD-10-CM | POA: Diagnosis present

## 2011-12-14 DIAGNOSIS — E559 Vitamin D deficiency, unspecified: Secondary | ICD-10-CM | POA: Diagnosis present

## 2011-12-14 DIAGNOSIS — Z7982 Long term (current) use of aspirin: Secondary | ICD-10-CM | POA: Diagnosis not present

## 2011-12-14 DIAGNOSIS — D509 Iron deficiency anemia, unspecified: Secondary | ICD-10-CM | POA: Diagnosis not present

## 2011-12-14 DIAGNOSIS — F329 Major depressive disorder, single episode, unspecified: Secondary | ICD-10-CM | POA: Diagnosis present

## 2011-12-14 DIAGNOSIS — G40909 Epilepsy, unspecified, not intractable, without status epilepticus: Secondary | ICD-10-CM | POA: Diagnosis present

## 2011-12-14 DIAGNOSIS — K5730 Diverticulosis of large intestine without perforation or abscess without bleeding: Secondary | ICD-10-CM | POA: Diagnosis not present

## 2011-12-14 DIAGNOSIS — M316 Other giant cell arteritis: Secondary | ICD-10-CM | POA: Diagnosis not present

## 2011-12-14 DIAGNOSIS — I4510 Unspecified right bundle-branch block: Secondary | ICD-10-CM | POA: Diagnosis present

## 2011-12-14 DIAGNOSIS — R000 Tachycardia, unspecified: Secondary | ICD-10-CM | POA: Diagnosis present

## 2011-12-14 DIAGNOSIS — K559 Vascular disorder of intestine, unspecified: Secondary | ICD-10-CM | POA: Diagnosis not present

## 2011-12-14 DIAGNOSIS — N182 Chronic kidney disease, stage 2 (mild): Secondary | ICD-10-CM | POA: Diagnosis present

## 2011-12-14 DIAGNOSIS — G609 Hereditary and idiopathic neuropathy, unspecified: Secondary | ICD-10-CM | POA: Diagnosis present

## 2011-12-14 DIAGNOSIS — G43909 Migraine, unspecified, not intractable, without status migrainosus: Secondary | ICD-10-CM | POA: Diagnosis present

## 2011-12-14 DIAGNOSIS — F411 Generalized anxiety disorder: Secondary | ICD-10-CM | POA: Diagnosis present

## 2011-12-14 DIAGNOSIS — N189 Chronic kidney disease, unspecified: Secondary | ICD-10-CM | POA: Diagnosis not present

## 2011-12-14 DIAGNOSIS — E119 Type 2 diabetes mellitus without complications: Secondary | ICD-10-CM | POA: Diagnosis not present

## 2011-12-14 DIAGNOSIS — I129 Hypertensive chronic kidney disease with stage 1 through stage 4 chronic kidney disease, or unspecified chronic kidney disease: Secondary | ICD-10-CM | POA: Diagnosis present

## 2011-12-14 DIAGNOSIS — J479 Bronchiectasis, uncomplicated: Secondary | ICD-10-CM | POA: Diagnosis present

## 2011-12-14 DIAGNOSIS — K625 Hemorrhage of anus and rectum: Secondary | ICD-10-CM | POA: Diagnosis not present

## 2011-12-14 DIAGNOSIS — D126 Benign neoplasm of colon, unspecified: Secondary | ICD-10-CM | POA: Diagnosis not present

## 2011-12-14 DIAGNOSIS — J309 Allergic rhinitis, unspecified: Secondary | ICD-10-CM | POA: Diagnosis present

## 2011-12-14 DIAGNOSIS — M353 Polymyalgia rheumatica: Secondary | ICD-10-CM | POA: Diagnosis present

## 2011-12-14 DIAGNOSIS — I10 Essential (primary) hypertension: Secondary | ICD-10-CM | POA: Diagnosis not present

## 2011-12-14 DIAGNOSIS — E785 Hyperlipidemia, unspecified: Secondary | ICD-10-CM | POA: Diagnosis not present

## 2011-12-14 DIAGNOSIS — Z86010 Personal history of colonic polyps: Secondary | ICD-10-CM | POA: Diagnosis not present

## 2011-12-14 DIAGNOSIS — D62 Acute posthemorrhagic anemia: Secondary | ICD-10-CM | POA: Diagnosis present

## 2011-12-14 DIAGNOSIS — IMO0002 Reserved for concepts with insufficient information to code with codable children: Secondary | ICD-10-CM | POA: Diagnosis not present

## 2011-12-14 DIAGNOSIS — M810 Age-related osteoporosis without current pathological fracture: Secondary | ICD-10-CM | POA: Diagnosis present

## 2011-12-14 LAB — TSH: TSH: 1.854 u[IU]/mL (ref 0.350–4.500)

## 2011-12-14 LAB — CARDIAC PANEL(CRET KIN+CKTOT+MB+TROPI)
CK, MB: 4.2 ng/mL — ABNORMAL HIGH (ref 0.3–4.0)
Relative Index: 3.3 — ABNORMAL HIGH (ref 0.0–2.5)
Troponin I: <0.3 ng/mL (ref <0.30)
Troponin I: <0.3 ng/mL (ref <0.30)

## 2011-12-14 LAB — COMPREHENSIVE METABOLIC PANEL
ALT: 16 U/L (ref 0–35)
ALT: 15 U/L (ref 0–35)
AST: 16 U/L (ref 0–37)
AST: 15 U/L (ref 0–37)
Albumin: 3 g/dL — ABNORMAL LOW (ref 3.5–5.2)
Alkaline Phosphatase: 52 U/L (ref 39–117)
CO2: 29 mEq/L (ref 19–32)
Calcium: 9 mg/dL (ref 8.4–10.5)
Chloride: 104 mEq/L (ref 96–112)
Creatinine, Ser: 1.68 mg/dL — ABNORMAL HIGH (ref 0.50–1.10)
GFR calc non Af Amer: 30 mL/min — ABNORMAL LOW (ref >90)
Potassium: 3.4 mEq/L — ABNORMAL LOW (ref 3.5–5.1)
Sodium: 139 mEq/L (ref 135–145)
Total Bilirubin: 0.5 mg/dL (ref 0.3–1.2)
Total Protein: 5.9 g/dL — ABNORMAL LOW (ref 6.0–8.3)

## 2011-12-14 LAB — CBC
HCT: 34.7 % — ABNORMAL LOW (ref 36.0–46.0)
MCH: 31.8 pg (ref 26.0–34.0)
MCHC: 32.7 g/dL (ref 30.0–36.0)
MCV: 97.3 fL (ref 78.0–100.0)
Platelets: 219 10*3/uL (ref 150–400)
Platelets: 247 10*3/uL (ref 150–400)
RBC: 3.36 MIL/uL — ABNORMAL LOW (ref 3.87–5.11)
RBC: 3.58 MIL/uL — ABNORMAL LOW (ref 3.87–5.11)
RDW: 14.2 % (ref 11.5–15.5)
RDW: 14.4 % (ref 11.5–15.5)
WBC: 13.4 10*3/uL — ABNORMAL HIGH (ref 4.0–10.5)

## 2011-12-14 LAB — DIFFERENTIAL
Basophils Absolute: 0 10*3/uL (ref 0.0–0.1)
Lymphocytes Relative: 17 % (ref 12–46)
Lymphs Abs: 2.3 10*3/uL (ref 0.7–4.0)
Monocytes Absolute: 1.1 10*3/uL — ABNORMAL HIGH (ref 0.1–1.0)
Neutro Abs: 9.6 10*3/uL — ABNORMAL HIGH (ref 1.7–7.7)

## 2011-12-14 LAB — HEMOGLOBIN A1C
Hgb A1c MFr Bld: 5.9 % — ABNORMAL HIGH (ref <5.7)
Mean Plasma Glucose: 123 mg/dL — ABNORMAL HIGH (ref <117)

## 2011-12-14 LAB — GLUCOSE, CAPILLARY: Glucose-Capillary: 81 mg/dL (ref 70–99)

## 2011-12-14 LAB — PHOSPHORUS: Phosphorus: 3.6 mg/dL (ref 2.3–4.6)

## 2011-12-14 MED ORDER — PEG-KCL-NACL-NASULF-NA ASC-C 100 G PO SOLR
1.0000 | ORAL | Status: AC
  Administered 2011-12-14 – 2011-12-15 (×2): 100 g via ORAL
  Filled 2011-12-14: qty 1

## 2011-12-14 MED ORDER — PENICILLIN V POTASSIUM 500 MG PO TABS
500.0000 mg | ORAL_TABLET | Freq: Four times a day (QID) | ORAL | Status: DC
  Administered 2011-12-14 – 2011-12-16 (×5): 500 mg via ORAL
  Filled 2011-12-14 (×10): qty 1

## 2011-12-14 NOTE — Progress Notes (Signed)
Critical care Physician-Brief Progress Note Patient Name: [name] DOB: 12-02-1940 MRN: [id number]  Date of Service  12/14/2011   HPI/Events of Note   Question from charge RN if patient can be transferred out of ICU  eICU Interventions  D/w Tye Savoy NP of GI and Dr Maudry Mayhew primary - both ok with transfer to floor of theirpatient   ORder to tx sent on behalf of Dr POlite   Intervention Category Intermediate Interventions: Other:  Shavonte Zhao 12/14/2011, 4:31 PM

## 2011-12-14 NOTE — ED Notes (Signed)
MD at bedside. 

## 2011-12-14 NOTE — Evaluation (Signed)
Physical Therapy One-Time Evaluation Patient Details Name: [name] MRN: [id number] DOB: 02/27/1941 Today's Date: 12/14/2011  Problem List:  Patient Active Problem List  Diagnoses  . DIABETES MELLITUS, TYPE II  . HYPERLIPIDEMIA  . HYPERTENSION  . BRONCHIECTASIS  . PULMONARY FIBROSIS  . GERD  . DIVERTICULOSIS, COLON  . RENAL INSUFFICIENCY  . LUMBAR RADICULOPATHY, LEFT  . OSTEOPOROSIS  . SEIZURE DISORDER  . BREAST CANCER, HX OF  . CEREBROVASCULAR ACCIDENT, HX OF  . MASTECTOMY, LEFT, HX OF  . Chronic renal insufficiency, stage II (mild)  . Rectal bleed    Past Medical History:  Past Medical History  Diagnosis Date  . History of breast cancer   . Osteoporosis   . Idiopathic pulmonary fibrosis   . Bronchiectasis   . Vitamin d deficiency   . Hyperlipidemia   . Allergic rhinitis   . GERD (gastroesophageal reflux disease)   . Left knee DJD   . Anxiety   . Migraine   . Diverticulosis of colon   . Seizure disorder   . Temporal arteritis     Right eye blind, on steroids per [facility]/ Dr [name]  . CVA (cerebral infarction) 10/2007    Right thalamic   . Type II or unspecified type diabetes mellitus without mention of complication, not stated as uncontrolled     2nd to steriods  . PMR (polymyalgia rheumatica)   . Anemia   . Gait disorder   . Peripheral neuropathy   . HTN (hypertension)   . Renal insufficiency    Past Surgical History:  Past Surgical History  Procedure Date  . Mastectomy   . Cholecystectomy   . Total knee arthroplasty     Left  . Tonsillectomy   . Cataract extraction     OS - Summer 2010  . Lumbar fusion 04/2010    W/Mechanical fixation - [facility]  . Spinal fusion 07/2010    T10-L2 interbody fusion / [facility]    PT Assessment/Plan/Recommendation PT Assessment Clinical Impression Statement: Patient admitted with GI bleed from home alone.  She presents close to her baseline and feel she is safe to return home with use of  walker.  She has had HHPT in the past and feels the safety modifications in her home are adequate for her to return there.  Educated her in community access for getting orthotic for left LE due to "foot drop" and fallen already at home. PT Recommendation/Assessment: Patent does not need any further PT services No Skilled PT: All education completed;Patient is modified independent with all activity/mobility PT Recommendation Follow Up Recommendations: No PT follow up Equipment Recommended: None recommended by PT PT Goals     PT Evaluation Precautions/Restrictions  Precautions Precautions: Fall Precaution Comments: reports h/o falls in distant past before she realized need to slow down.  Has neuropathy, foot drop on left and blind in right eye Prior [place] Lives With: Alone Type of Home: House Home Layout: One level Home Access: Ramped entrance Bathroom Shower/Tub: Walk-in shower Home Adaptive Equipment: Architect - four wheeled;Straight cane Prior Function Level of Independence: Independent with basic ADLs;Requires assistive device for independence;Independent with transfers;Independent with gait;Independent with homemaking with ambulation (assist for cleaning weekly) Driving: Yes Cognition Cognition Arousal/Alertness: Awake/alert Overall Cognitive Status: Appears within functional limits for tasks assessed Sensation/Coordination Sensation Light Touch: Impaired Detail Light Touch Impaired Details: Impaired LLE;Impaired RLE Additional Comments: decreased to light touch up to distal to knee both legs Extremity Assessment RLE  Assessment RLE Assessment: Within Functional Limits LLE Assessment LLE Assessment: Within Functional Limits Mobility (including Balance) Bed Mobility Bed Mobility: Yes Sitting - Scoot to Edge of Bed: 6: Modified independent (Device/Increase time) Sit to Supine: 6: Modified independent (Device/Increase  time) Transfers Transfers: Yes Sit to Stand: 6: Modified independent (Device/Increase time) Stand to Sit: 6: Modified independent (Device/Increase time) Ambulation/Gait Ambulation/Gait: Yes Ambulation/Gait Assistance: 6: Modified independent (Device/Increase time) Ambulation Distance (Feet): 400 Feet Assistive device: 4-wheeled walker Gait Pattern: Step-through pattern;Decreased dorsiflexion - left  Posture/Postural Control Posture/Postural Control: No significant limitations Exercise    End of Session PT - End of Session Equipment Utilized During Treatment: Gait belt Activity Tolerance: Patient tolerated treatment well Patient left: in chair;with call bell in reach General Behavior During Session: [facility] for tasks performed Cognition: [facility] for tasks performed  [facility] 12/14/2011, 1:42 PM

## 2011-12-14 NOTE — Consult Note (Signed)
[name] [name] Gastroenterology Consultation  Referring Provider: [facility] Primary Care Physician:  [name] [name] Hare, MD, MD Primary Gastroenterologist:   [name] [name] Emery, MD Reason for Consultation:  hematochezia  HPI: [name] [name] [name] is a 71 y.o. [name] with multiple medical problems admitted yesterday with hematochezia, leukocytosis. Hemoglobin was 14.6 in July 2012, 12.6 yesterday and down to 10.7 today. [name] [name] [name] is on chronic Prednisone for temporal arteritis. [name] [name] [name] had been on Anaprox since Monday for dental pain. Other than that [name] [name] [name] takes a baby asa but no other NSAIDS.   [name] is admitted for rectal bleeding. Two days ago [name] [name] [name] was having problems with constipation, not unusual for her. Then, yesterday [name] [name] [name] had two soft bowel movemenst followed later by a large amount of bright red blood. [name] [name] [name] had several more episodes of painless hematochezia prior to coming to [name] [name] emergency department. Her last episode of hematochezia was sometime between midnight and 3 AM this morning. [name] [name] [name] feels fine though was slightly lightheaded yesterday.  [name] concerned about a gastric ulcer. Over [name] [name] past few months [name] [name] [name]'s had intermittent burning in her right upper quadrant. Burning alleviated by eating. [name] [name] [name] takes a daily Pepcid though is not sure why [name] [name] [name] has no history of GERD. Sounds like [name] [name] [name] has been on Pepcid for a few years now.     Past Medical History  Diagnosis Date  . History of breast cancer   . Osteoporosis   . Idiopathic pulmonary fibrosis   . Bronchiectasis   . Vitamin d deficiency   . Hyperlipidemia   . Allergic rhinitis   . GERD (gastroesophageal reflux disease)   . Left knee DJD   . Anxiety   . Migraine   . Diverticulosis of colon   . Seizure disorder   . Temporal arteritis     Right eye blind, on steroids per [name] [name] [name]/ Dr [name] [name] [name]  . CVA (cerebral infarction) 10/2007    Right thalamic   . Type II or unspecified type diabetes mellitus without mention of complication, not stated as  uncontrolled     2nd to steriods  . PMR (polymyalgia rheumatica)   . Anemia   . Gait disorder   . Peripheral neuropathy   . HTN (hypertension)   . Renal insufficiency     Past Surgical History  Procedure Date  . Mastectomy   . Cholecystectomy   . Total knee arthroplasty     Left  . Tonsillectomy   . Cataract extraction     OS - Summer 2010  . Lumbar fusion 04/2010    W/Mechanical fixation - [facility]  . Spinal fusion 07/2010    T10-L2 interbody fusion / [facility]: [facility] Rhonda W    Prior to Admission medications   Medication Sig Start Date End Date Taking? Authorizing Provider  amLODipine (NORVASC) 10 MG tablet Take 1 tablet (10 mg total) by mouth daily. 06/05/11 06/04/12 Yes Neena Rhymes, MD  aspirin 81 MG EC tablet Take 81 mg by mouth daily.     Yes Historical Provider, MD  Calcium-Magnesium-Zinc 1000-500-50 MG TABS Take by mouth daily.     Yes Historical Provider, MD  cholecalciferol (VITAMIN D) 1000 UNITS tablet Take 1,000 Units by mouth daily.     Yes Historical Provider, MD  denosumab (PROLIA) 60 MG/ML SOLN Inject 60 mg into [name] [name] skin every 6 (six) months.     Yes Historical Provider, MD  famotidine (PEPCID) 20 MG tablet Take 20 mg by mouth daily.    Yes Historical Provider, MD  ferrous sulfate 325 (65 FE)  MG tablet Take 325 mg by mouth daily.    Yes Historical Provider, MD  furosemide (LASIX) 40 MG tablet Take 1 tablet (40 mg total) by mouth daily. 06/05/11  Yes Neena Rhymes, MD  gabapentin (NEURONTIN) 300 MG capsule Take 1 capsule (300 mg total) by mouth 2 (two) times daily. 06/05/11  Yes Neena Rhymes, MD  methocarbamol (ROBAXIN) 750 MG tablet Take 750 mg by mouth 2 (two) times daily. 06/05/11  Yes Neena Rhymes, MD  Multiple Vitamins-Minerals (MULTIVITAMIN,TX-MINERALS) tablet Take 1 tablet by mouth daily.     Yes Historical Provider, MD  naproxen sodium (ANAPROX) 550 MG tablet Take 550 mg by mouth 3 (three) times daily with meals.   Yes Historical Provider,  MD  oxyCODONE (OXYCONTIN) 10 MG 12 hr tablet Take 10 mg by mouth 2 (two) times daily. Fill on or After March 04, 2012. 12/05/11  Yes Neena Rhymes, MD  Oxycodone HCl 10 MG TABS Take 1 tablet by mouth every 4 (four) hours as needed. Breakthrough pain.   Yes Historical Provider, MD  penicillin v potassium (VEETID) 500 MG tablet Take 500 mg by mouth 4 (four) times daily. 12/10/11 12/17/11 Yes Historical Provider, MD  predniSONE (DELTASONE) 10 MG tablet Take 10 mg by mouth See admin instructions. Take with 2.5 mg tablet for a dose of 12.5 daily 06/05/11  Yes Neena Rhymes, MD  predniSONE (DELTASONE) 2.5 MG tablet Take 2.5 mg by mouth See admin instructions. Take with 10mg  for a dose of 12.5mg  daily 06/05/11  Yes Neena Rhymes, MD  simvastatin (ZOCOR) 5 MG tablet Take 1 tablet (5 mg total) by mouth at bedtime. 06/20/11  Yes Neena Rhymes, MD  Specialty Vitamins Products (MAGNESIUM, AMINO ACID CHELATE,) 133 MG tablet Take 1 tablet by mouth 2 (two) times daily.     Yes Historical Provider, MD    Current Facility-Administered Medications  Medication Dose Route Frequency Provider Last Rate Last Dose  . 0.9 %  sodium chloride infusion   Intravenous Continuous Kandice Hams, MD 50 mL/hr at 12/14/11 [phone]    . amLODipine (NORVASC) tablet 10 mg  10 mg Oral Daily Robbie Lis, MD      . cholecalciferol (VITAMIN D) tablet 1,000 Units  1,000 Units Oral Daily Robbie Lis, MD      . ferrous sulfate tablet 325 mg  325 mg Oral Daily Robbie Lis, MD      . furosemide (LASIX) tablet 40 mg  40 mg Oral Daily Robbie Lis, MD      . gabapentin (NEURONTIN) capsule 300 mg  300 mg Oral BID Robbie Lis, MD      . methocarbamol (ROBAXIN) tablet 750 mg  750 mg Oral BID Robbie Lis, MD   750 mg at 12/14/11 [id number]  . mulitivitamin with minerals tablet 1 tablet  1 tablet Oral Daily Robbie Lis, MD      . oxyCODONE (OXYCONTIN) 12 hr tablet 10 mg  10 mg Oral Q12H Robbie Lis, MD      . pantoprazole (PROTONIX)  injection 40 mg  40 mg Intravenous Q12H Robbie Lis, MD   40 mg at 12/14/11 0344  . predniSONE (DELTASONE) tablet 12.5 mg  12.5 mg Oral Q breakfast Hosie Poisson, MD   12.5 mg at 12/14/11 0755  . simvastatin (ZOCOR) tablet 5 mg  5 mg Oral QHS Robbie Lis, MD      . sodium chloride 0.9 % injection  3 mL  3 mL Intravenous Q12H Robbie Lis, MD   3 mL at 12/14/11 0413  . DISCONTD: pantoprazole (PROTONIX) injection 40 mg  40 mg Intravenous Once Shaune Pollack, MD      . DISCONTD: predniSONE (DELTASONE) tablet 10 mg  10 mg Oral See admin instructions Robbie Lis, MD      . DISCONTD: predniSONE (DELTASONE) tablet 2.5 mg  2.5 mg Oral See admin instructions Robbie Lis, MD        Allergies as of 12/13/2011  . (No Known Allergies)    Family History  Problem Relation Age of Onset  . Brain cancer Mother   . Hypertension Mother   . Dementia Father   . Hypertension Father   . Breast cancer Sister     History   Social History  . Marital Status: Widowed    Spouse Name: N/A    Number of Children: 1  . Years of Education: N/A   Occupational History  . Land    Social History Main Topics  . Smoking status: Never Smoker   . Smokeless tobacco: Never Used  . Alcohol Use: No     heavy drinker until 1995 - sobriety with AA  . Drug Use: No  . Sexually Active: Not Currently    Social History Narrative      Married - husband invalid with stroke - widowed in 2009. 1 chil. retired - [employer]. Lives alone. ACP - not formerly discussed - wishes to be a full code.     Review of Systems: All other systems reviewed and negative except where noted in [name] [name] HPI.   PHYSICAL EXAM: Vital signs in last 24 hours: Temp:  [97.6 F (36.4 C)-98.5 F (36.9 C)] 97.6 F (36.4 C) (03/22 0800) Pulse Rate:  [73-107] 74  (03/22 0756) Resp:  [13-24] 14  (03/22 0756) BP: (104-138)/(54-105) 124/64 mmHg (03/22 0756) SpO2:  [85  %-99 %] 99 % (03/22 0756) Weight:  [198 lb (89.812 kg)-204 lb 5.9 oz (92.7 kg)] 204 lb 5.9 oz (92.7 kg) (03/22 0435) Last BM Date: 12/13/11 General:   [name] [name] [name] in NAD Head:  Normocephalic and atraumatic. Eyes:   No icterus.   Conjunctiva pink. Ears:  Normal auditory acuity. Neck:  Supple; no masses felt Lungs:  Respirations even and unlabored. Lungs clear to auscultation bilaterally.   No wheezes, crackles, or rhonchi.  Heart:  Regular rate and rhythm; no murmurs heard. Abdomen:  Soft, nondistended, nontender. Normal bowel sounds. No appreciable masses or hepatomegaly.  Rectal:  Scant amount of thin, bloody drainage in vault.  MS:  Symmetrical without gross deformities.  Extremities:  Without edema. Neurologic:  Alert and  Oriented,  grossly normal neurologically. Skin:  Intact without significant lesions or rashes. Cervical Nodes:  No significant cervical adenopathy. Psych:  Alert and cooperative. Normal affect.  LAB RESULTS:  Basename 12/14/11 0640 12/14/11 0001 12/13/11 2205 12/13/11 2140  WBC 10.7* 13.4* -- 11.1*  HGB 10.7* 11.2* 12.6 --  HCT 32.7* 34.7* 37.0 --  PLT 219 247 -- 260   BMET  Basename 12/14/11 0640 12/14/11 0001 12/13/11 2205 12/13/11 2140  NA 137 139 142 --  K 3.4* 3.7 3.8 --  CL 104 104 109 --  CO2 26 29 -- 26  GLUCOSE 84 105* 99 --  BUN 37* 38* 39* --  CREATININE 1.69* 1.68* 1.60* --  CALCIUM 8.7 9.0 -- 9.1   LFT  Basename 12/14/11 0640  PROT 5.4*  ALBUMIN 3.0*  AST 15  ALT 15  ALKPHOS 52  BILITOT 0.5  BILIDIR --  IBILI --   PT/INR  Basename 12/13/11 2140  LABPROT 13.7  INR 1.03    PREVIOUS ENDOSCOPIES: Colonoscopy 2006 [facility]). Findings: diverticulosis, one 62mm sessile polyp (polyp path not found, pathology doesn't have a record of it either). Overdue for recall colonoscopy  IMPRESSION / PLAN: 1. [name] [name] 71 year old [name] with multiple medical problems as listed in [name] [name] past medical history. Admitted with painless  hematochezia. Suspect diverticular hemorrhage. [name] [name] [name] is hemodynamically stable. Continue supportive care. [name] will need a colonoscopy (probably with propofol based on medications [name] [name] [name] takes) as [name] [name] [name] is overdue on her surveillance exam but this can probably be done outpatient as previously planned. 2. Anemia of acute blood loss, hemoglobin down to 10.7. Hopefully [name] [name] [name] will not require a blood transfusion, bleeding seems to have slowed. 3. Diverticulosis 4. Depression, [name] [name] [name] has been referred by PCP to [place]. 5  History of colon polyps 2006. Path report couldn't be found, even  after calling pathology department..  6. Several month history of intermittent right upper quadrant burning alleviated with meals. [name] [name] [name] could have gastritis, duodenitis, or ulcer disease but not likely related to #1. Recommend changing Pepcid to a PPI. [name] [name] [name] may need EGD at time of next colonoscopy.  7. Temporal arteritis, [name] [name] [name] is on chronic prednisone.  8. History of breast cancer (?in situ) in 1990's.  Thanks   LOS: 1 day   Tye Savoy  12/14/2011, 9:46 AM    ________________________________________________________________________  [facility] MD note:  I personally examined [name] [name] [name], reviewed [name] [name] data and agree with [name] [name] assessment and plan described above.   Likely diverticular bleeding that seems to have slowed or even stopped. [name] [name] [name] knows [name] [name] [name] is due for a colonoscopy for polyp surveillance (overdue).  I suggested that it could be done as outpatient with Dr. Fuller Plan but [name] [name] [name] told me we'd "better do it while [name] [name] [name]'s here because [name] [name] [name]'ll never do it once [name] [name] [name] leaves."  Will prep today for colonoscopy, to be done with Dr. Collene Mares tomorrow, covering for [facility] this weekend.  I discussed [name] [name] split dose nature of [name] [name] prep with RN.   Owens Loffler, MD [facility] Pager [phone]

## 2011-12-14 NOTE — Progress Notes (Signed)
Subjective: Admission H&P reviewed, patient admitted with GI bleed, reported 10 episodes of blood, bright red per rectum. Patient states she's never had a GI bleed in the past. She states she's had a colonoscopy within 10 years, toad it was normal. She denies any nausea vomiting abdominal pain, dizziness or lightheadedness. She had been taken some NSAIDs for a recent dental procedure. Hemoglobin remains greater than 10, transfusion was not required overnight.  Objective: Vital signs in last 24 hours: Temp:  [97.6 F (36.4 C)-98.5 F (36.9 C)] 97.6 F (36.4 C) (03/22 0800) Pulse Rate:  [73-107] 74  (03/22 0756) Resp:  [13-24] 14  (03/22 0756) BP: (104-138)/(54-105) 124/64 mmHg (03/22 0756) SpO2:  [85 %-99 %] 99 % (03/22 0756) Weight:  [89.812 kg (198 lb)-92.7 kg (204 lb 5.9 oz)] 92.7 kg (204 lb 5.9 oz) (03/22 0435) Weight change:  Last BM Date: 12/13/11  Intake/Output from previous day: 03/21 0701 - 03/22 0700 In: 225 [I.V.:225] Out: -  Intake/Output this shift: Total I/O In: 207.9 [I.V.:207.9] Out: 400 [Urine:400]  General appearance: alert and cooperative Resp: bilateral dry crackles Cardio: regular rate and rhythm, S1, S2 normal, no murmur, click, rub or gallop GI: soft, non-tender; bowel sounds normal; no masses,  no organomegaly Extremities: extremities normal, atraumatic, no cyanosis or edema  Lab Results:  Results for orders placed during the hospital encounter of 12/13/11 (from the past 24 hour(s))  CBC     Status: Abnormal   Collection Time   12/13/11  9:40 PM      Component Value Range   WBC 11.1 (*) 4.0 - 10.5 (K/uL)   RBC 3.92  3.87 - 5.11 (MIL/uL)   Hemoglobin 12.6  12.0 - 15.0 (g/dL)   HCT 38.0  36.0 - 46.0 (%)   MCV 96.9  78.0 - 100.0 (fL)   MCH 32.1  26.0 - 34.0 (pg)   MCHC 33.2  30.0 - 36.0 (g/dL)   RDW 14.1  11.5 - 15.5 (%)   Platelets 260  150 - 400 (K/uL)  DIFFERENTIAL     Status: Abnormal   Collection Time   12/13/11  9:40 PM      Component Value  Range   Neutrophils Relative 74  43 - 77 (%)   Neutro Abs 8.3 (*) 1.7 - 7.7 (K/uL)   Lymphocytes Relative 14  12 - 46 (%)   Lymphs Abs 1.6  0.7 - 4.0 (K/uL)   Monocytes Relative 9  3 - 12 (%)   Monocytes Absolute 1.0  0.1 - 1.0 (K/uL)   Eosinophils Relative 3  0 - 5 (%)   Eosinophils Absolute 0.3  0.0 - 0.7 (K/uL)   Basophils Relative 0  0 - 1 (%)   Basophils Absolute 0.0  0.0 - 0.1 (K/uL)  COMPREHENSIVE METABOLIC PANEL     Status: Abnormal   Collection Time   12/13/11  9:40 PM      Component Value Range   Sodium 139  135 - 145 (mEq/L)   Potassium 3.7  3.5 - 5.1 (mEq/L)   Chloride 104  96 - 112 (mEq/L)   CO2 26  19 - 32 (mEq/L)   Glucose, Bld 97  70 - 99 (mg/dL)   BUN 37 (*) 6 - 23 (mg/dL)   Creatinine, Ser 1.66 (*) 0.50 - 1.10 (mg/dL)   Calcium 9.1  8.4 - 10.5 (mg/dL)   Total Protein 6.4  6.0 - 8.3 (g/dL)   Albumin 3.7  3.5 - 5.2 (g/dL)   AST 19  0 - 37 (U/L)   ALT 19  0 - 35 (U/L)   Alkaline Phosphatase 67  39 - 117 (U/L)   Total Bilirubin 0.5  0.3 - 1.2 (mg/dL)   GFR calc non Af Amer 30 (*) >90 (mL/min)   GFR calc Af Amer 35 (*) >90 (mL/min)  PROTIME-INR     Status: Normal   Collection Time   12/13/11  9:40 PM      Component Value Range   Prothrombin Time 13.7  11.6 - 15.2 (seconds)   INR 1.03  0.00 - 1.49   APTT     Status: Normal   Collection Time   12/13/11  9:40 PM      Component Value Range   aPTT 35  24 - 37 (seconds)  POCT I-STAT, CHEM 8     Status: Abnormal   Collection Time   12/13/11 10:05 PM      Component Value Range   Sodium 142  135 - 145 (mEq/L)   Potassium 3.8  3.5 - 5.1 (mEq/L)   Chloride 109  96 - 112 (mEq/L)   BUN 39 (*) 6 - 23 (mg/dL)   Creatinine, Ser 1.60 (*) 0.50 - 1.10 (mg/dL)   Glucose, Bld 99  70 - 99 (mg/dL)   Calcium, Ion 1.17  1.12 - 1.32 (mmol/L)   TCO2 26  0 - 100 (mmol/L)   Hemoglobin 12.6  12.0 - 15.0 (g/dL)   HCT 37.0  36.0 - 46.0 (%)  PREPARE RBC (CROSSMATCH)     Status: Normal   Collection Time   12/13/11 10:30 PM       Component Value Range   Order Confirmation ORDER PROCESSED BY BLOOD BANK    TYPE AND SCREEN     Status: Normal (Preliminary result)   Collection Time   12/13/11 10:33 PM      Component Value Range   ABO/RH(D) O POS     Antibody Screen NEG     Sample Expiration 12/16/2011     Unit Number [id number]     Blood Component Type RED CELLS,LR     Unit division 00     Status of Unit ALLOCATED     Transfusion Status OK TO TRANSFUSE     Crossmatch Result Compatible     Unit Number [id number]     Blood Component Type RED CELLS,LR     Unit division 00     Status of Unit ALLOCATED     Transfusion Status OK TO TRANSFUSE     Crossmatch Result Compatible    GLUCOSE, CAPILLARY     Status: Abnormal   Collection Time   12/13/11 11:38 PM      Component Value Range   Glucose-Capillary 113 (*) 70 - 99 (mg/dL)   Comment 1 Documented in Chart     Comment 2 Notify RN    COMPREHENSIVE METABOLIC PANEL     Status: Abnormal   Collection Time   12/14/11 12:01 AM      Component Value Range   Sodium 139  135 - 145 (mEq/L)   Potassium 3.7  3.5 - 5.1 (mEq/L)   Chloride 104  96 - 112 (mEq/L)   CO2 29  19 - 32 (mEq/L)   Glucose, Bld 105 (*) 70 - 99 (mg/dL)   BUN 38 (*) 6 - 23 (mg/dL)   Creatinine, Ser 1.68 (*) 0.50 - 1.10 (mg/dL)   Calcium 9.0  8.4 - 10.5 (mg/dL)   Total Protein 5.9 (*) 6.0 - 8.3 (  g/dL)   Albumin 3.4 (*) 3.5 - 5.2 (g/dL)   AST 16  0 - 37 (U/L)   ALT 16  0 - 35 (U/L)   Alkaline Phosphatase 63  39 - 117 (U/L)   Total Bilirubin 0.4  0.3 - 1.2 (mg/dL)   GFR calc non Af Amer 30 (*) >90 (mL/min)   GFR calc Af Amer 34 (*) >90 (mL/min)  MAGNESIUM     Status: Abnormal   Collection Time   12/14/11 12:01 AM      Component Value Range   Magnesium 2.6 (*) 1.5 - 2.5 (mg/dL)  PHOSPHORUS     Status: Normal   Collection Time   12/14/11 12:01 AM      Component Value Range   Phosphorus 3.6  2.3 - 4.6 (mg/dL)  CBC     Status: Abnormal   Collection Time   12/14/11 12:01 AM      Component Value Range    WBC 13.4 (*) 4.0 - 10.5 (K/uL)   RBC 3.58 (*) 3.87 - 5.11 (MIL/uL)   Hemoglobin 11.2 (*) 12.0 - 15.0 (g/dL)   HCT 34.7 (*) 36.0 - 46.0 (%)   MCV 96.9  78.0 - 100.0 (fL)   MCH 31.3  26.0 - 34.0 (pg)   MCHC 32.3  30.0 - 36.0 (g/dL)   RDW 14.2  11.5 - 15.5 (%)   Platelets 247  150 - 400 (K/uL)  DIFFERENTIAL     Status: Abnormal   Collection Time   12/14/11 12:01 AM      Component Value Range   Neutrophils Relative 72  43 - 77 (%)   Neutro Abs 9.6 (*) 1.7 - 7.7 (K/uL)   Lymphocytes Relative 17  12 - 46 (%)   Lymphs Abs 2.3  0.7 - 4.0 (K/uL)   Monocytes Relative 8  3 - 12 (%)   Monocytes Absolute 1.1 (*) 0.1 - 1.0 (K/uL)   Eosinophils Relative 3  0 - 5 (%)   Eosinophils Absolute 0.4  0.0 - 0.7 (K/uL)   Basophils Relative 0  0 - 1 (%)   Basophils Absolute 0.0  0.0 - 0.1 (K/uL)  TSH     Status: Normal   Collection Time   12/14/11 12:01 AM      Component Value Range   TSH 1.854  0.350 - 4.500 (uIU/mL)  CARDIAC PANEL(CRET KIN+CKTOT+MB+TROPI)     Status: Abnormal   Collection Time   12/14/11 12:01 AM      Component Value Range   Total CK 128  7 - 177 (U/L)   CK, MB 4.2 (*) 0.3 - 4.0 (ng/mL)   Troponin I <0.30  <0.30 (ng/mL)   Relative Index 3.3 (*) 0.0 - 2.5   PRO B NATRIURETIC PEPTIDE     Status: Abnormal   Collection Time   12/14/11 12:01 AM      Component Value Range   Pro B Natriuretic peptide (BNP) 428.4 (*) 0 - 125 (pg/mL)  HEMOGLOBIN A1C     Status: Abnormal   Collection Time   12/14/11 12:01 AM      Component Value Range   Hemoglobin A1C 5.9 (*) <5.7 (%)   Mean Plasma Glucose 123 (*) <117 (mg/dL)  MRSA PCR SCREENING     Status: Normal   Collection Time   12/14/11  3:37 AM      Component Value Range   MRSA by PCR NEGATIVE  NEGATIVE   CARDIAC PANEL(CRET KIN+CKTOT+MB+TROPI)     Status: Normal  Collection Time   12/14/11  6:40 AM      Component Value Range   Total CK 95  7 - 177 (U/L)   CK, MB 3.4  0.3 - 4.0 (ng/mL)   Troponin I <0.30  <0.30 (ng/mL)   Relative Index  RELATIVE INDEX IS INVALID  0.0 - 2.5   COMPREHENSIVE METABOLIC PANEL     Status: Abnormal   Collection Time   12/14/11  6:40 AM      Component Value Range   Sodium 137  135 - 145 (mEq/L)   Potassium 3.4 (*) 3.5 - 5.1 (mEq/L)   Chloride 104  96 - 112 (mEq/L)   CO2 26  19 - 32 (mEq/L)   Glucose, Bld 84  70 - 99 (mg/dL)   BUN 37 (*) 6 - 23 (mg/dL)   Creatinine, Ser 1.69 (*) 0.50 - 1.10 (mg/dL)   Calcium 8.7  8.4 - 10.5 (mg/dL)   Total Protein 5.4 (*) 6.0 - 8.3 (g/dL)   Albumin 3.0 (*) 3.5 - 5.2 (g/dL)   AST 15  0 - 37 (U/L)   ALT 15  0 - 35 (U/L)   Alkaline Phosphatase 52  39 - 117 (U/L)   Total Bilirubin 0.5  0.3 - 1.2 (mg/dL)   GFR calc non Af Amer 29 (*) >90 (mL/min)   GFR calc Af Amer 34 (*) >90 (mL/min)  CBC     Status: Abnormal   Collection Time   12/14/11  6:40 AM      Component Value Range   WBC 10.7 (*) 4.0 - 10.5 (K/uL)   RBC 3.36 (*) 3.87 - 5.11 (MIL/uL)   Hemoglobin 10.7 (*) 12.0 - 15.0 (g/dL)   HCT 32.7 (*) 36.0 - 46.0 (%)   MCV 97.3  78.0 - 100.0 (fL)   MCH 31.8  26.0 - 34.0 (pg)   MCHC 32.7  30.0 - 36.0 (g/dL)   RDW 14.4  11.5 - 15.5 (%)   Platelets 219  150 - 400 (K/uL)  GLUCOSE, CAPILLARY     Status: Normal   Collection Time   12/14/11  7:51 AM      Component Value Range   Glucose-Capillary 81  70 - 99 (mg/dL)   Comment 1 Notify RN     Comment 2 Documented in Chart        Studies/Results: No results found.  Medications:  Prior to Admission:  Prescriptions prior to admission  Medication Sig Dispense Refill  . amLODipine (NORVASC) 10 MG tablet Take 1 tablet (10 mg total) by mouth daily.  90 tablet  3  . aspirin 81 MG EC tablet Take 81 mg by mouth daily.        . Calcium-Magnesium-Zinc 1000-500-50 MG TABS Take by mouth daily.        . cholecalciferol (VITAMIN D) 1000 UNITS tablet Take 1,000 Units by mouth daily.        Marland Kitchen denosumab (PROLIA) 60 MG/ML SOLN Inject 60 mg into the skin every 6 (six) months.        . famotidine (PEPCID) 20 MG tablet Take 20  mg by mouth daily.       . ferrous sulfate 325 (65 FE) MG tablet Take 325 mg by mouth daily.       . furosemide (LASIX) 40 MG tablet Take 1 tablet (40 mg total) by mouth daily.  90 tablet  3  . gabapentin (NEURONTIN) 300 MG capsule Take 1 capsule (300 mg total) by mouth 2 (two) times  daily.  180 capsule  3  . methocarbamol (ROBAXIN) 750 MG tablet Take 750 mg by mouth 2 (two) times daily.      . Multiple Vitamins-Minerals (MULTIVITAMIN,TX-MINERALS) tablet Take 1 tablet by mouth daily.        . naproxen sodium (ANAPROX) 550 MG tablet Take 550 mg by mouth 3 (three) times daily with meals.      Marland Kitchen oxyCODONE (OXYCONTIN) 10 MG 12 hr tablet Take 10 mg by mouth 2 (two) times daily. Fill on or After March 04, 2012.  60 tablet  0  . Oxycodone HCl 10 MG TABS Take 1 tablet by mouth every 4 (four) hours as needed. Breakthrough pain.      Marland Kitchen penicillin v potassium (VEETID) 500 MG tablet Take 500 mg by mouth 4 (four) times daily.      . predniSONE (DELTASONE) 10 MG tablet Take 10 mg by mouth See admin instructions. Take with 2.5 mg tablet for a dose of 12.5 daily      . predniSONE (DELTASONE) 2.5 MG tablet Take 2.5 mg by mouth See admin instructions. Take with 10mg  for a dose of 12.5mg  daily      . simvastatin (ZOCOR) 5 MG tablet Take 1 tablet (5 mg total) by mouth at bedtime.  90 tablet  3  . Specialty Vitamins Products (MAGNESIUM, AMINO ACID CHELATE,) 133 MG tablet Take 1 tablet by mouth 2 (two) times daily.         Scheduled:   . amLODipine  10 mg Oral Daily  . cholecalciferol  1,000 Units Oral Daily  . ferrous sulfate  325 mg Oral Daily  . furosemide  40 mg Oral Daily  . gabapentin  300 mg Oral BID  . methocarbamol  750 mg Oral BID  . mulitivitamin with minerals  1 tablet Oral Daily  . oxyCODONE  10 mg Oral Q12H  . pantoprazole (PROTONIX) IV  40 mg Intravenous Q12H  . predniSONE  12.5 mg Oral Q breakfast  . simvastatin  5 mg Oral QHS  . sodium chloride  3 mL Intravenous Q12H  . DISCONTD: pantoprazole  (PROTONIX) IV  40 mg Intravenous Once  . DISCONTD: predniSONE  10 mg Oral See admin instructions  . DISCONTD: predniSONE  2.5 mg Oral See admin instructions   Continuous:   . sodium chloride 50 mL/hr at 12/14/11 [id number]    Assessment/Plan: GI bleed, working diagnoses at this time diverticulosis, we will try to review previous colonoscopy report. Patient states bleeding has stopped, clear liquids will be started, if no recurrent bleeding diet will be advanced. 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  LOS: 1 day   Doniel Maiello D 12/14/2011, 10:01 AM

## 2011-12-14 NOTE — ED Notes (Addendum)
Per admitting doctor, Dr. Dedra Skeens Divine, labs to be collected every 8hr. First collection done at 2140. EKG done. Additional labs drawn (TSH, A1C, Cardiac panel) at 0007 and sent to the lab.

## 2011-12-14 NOTE — Progress Notes (Signed)
Order for 2 units PRBCs. Pt states she does not want to receive blood unless absolutely necessary. Hgb 11.1, 1 bloody BM in ED. MD notified and orders received to hold blood until next CBC at 8 am.

## 2011-12-15 ENCOUNTER — Encounter (HOSPITAL_COMMUNITY)
Admission: EM | Disposition: A | Discharge status: Home / Self Care | Payer: Self-pay | Source: Home / Self Care | Attending: Internal Medicine

## 2011-12-15 ENCOUNTER — Encounter (HOSPITAL_COMMUNITY): Payer: Self-pay | Admitting: *Deleted

## 2011-12-15 DIAGNOSIS — I10 Essential (primary) hypertension: Secondary | ICD-10-CM | POA: Diagnosis not present

## 2011-12-15 DIAGNOSIS — N189 Chronic kidney disease, unspecified: Secondary | ICD-10-CM | POA: Diagnosis not present

## 2011-12-15 DIAGNOSIS — D126 Benign neoplasm of colon, unspecified: Secondary | ICD-10-CM | POA: Diagnosis not present

## 2011-12-15 DIAGNOSIS — E119 Type 2 diabetes mellitus without complications: Secondary | ICD-10-CM | POA: Diagnosis not present

## 2011-12-15 DIAGNOSIS — E785 Hyperlipidemia, unspecified: Secondary | ICD-10-CM | POA: Diagnosis not present

## 2011-12-15 DIAGNOSIS — K559 Vascular disorder of intestine, unspecified: Secondary | ICD-10-CM | POA: Diagnosis not present

## 2011-12-15 DIAGNOSIS — M316 Other giant cell arteritis: Secondary | ICD-10-CM | POA: Diagnosis not present

## 2011-12-15 HISTORY — PX: COLONOSCOPY: SHX5424

## 2011-12-15 LAB — CBC
MCH: 31.8 pg (ref 26.0–34.0)
Platelets: 276 10*3/uL (ref 150–400)
RBC: 3.62 MIL/uL — ABNORMAL LOW (ref 3.87–5.11)
WBC: 9.3 10*3/uL (ref 4.0–10.5)

## 2011-12-15 LAB — GLUCOSE, CAPILLARY: Glucose-Capillary: 81 mg/dL (ref 70–99)

## 2011-12-15 SURGERY — COLONOSCOPY
Anesthesia: Moderate Sedation

## 2011-12-15 MED ORDER — SODIUM CHLORIDE 0.9 % IV SOLN
Freq: Once | INTRAVENOUS | Status: AC
  Administered 2011-12-15: 15:00:00 via INTRAVENOUS

## 2011-12-15 MED ORDER — FENTANYL CITRATE 0.05 MG/ML IJ SOLN
INTRAMUSCULAR | Status: DC | PRN
  Administered 2011-12-15: 12.5 ug via INTRAVENOUS
  Administered 2011-12-15 (×2): 25 ug via INTRAVENOUS

## 2011-12-15 MED ORDER — FENTANYL CITRATE 0.05 MG/ML IJ SOLN
INTRAMUSCULAR | Status: AC
  Filled 2011-12-15: qty 4

## 2011-12-15 MED ORDER — MIDAZOLAM HCL 10 MG/2ML IJ SOLN
INTRAMUSCULAR | Status: DC | PRN
  Administered 2011-12-15 (×2): 1 mg via INTRAVENOUS
  Administered 2011-12-15: 2 mg via INTRAVENOUS
  Administered 2011-12-15: 1 mg via INTRAVENOUS
  Administered 2011-12-15: 2 mg via INTRAVENOUS
  Administered 2011-12-15: 1 mg via INTRAVENOUS

## 2011-12-15 MED ORDER — SODIUM CHLORIDE 0.9 % IV SOLN
Freq: Once | INTRAVENOUS | Status: AC
  Administered 2011-12-15: 250 mL via INTRAVENOUS

## 2011-12-15 MED ORDER — PANTOPRAZOLE SODIUM 40 MG PO TBEC
40.0000 mg | DELAYED_RELEASE_TABLET | Freq: Two times a day (BID) | ORAL | Status: DC
  Administered 2011-12-15 – 2011-12-16 (×2): 40 mg via ORAL
  Filled 2011-12-15 (×3): qty 1

## 2011-12-15 MED ORDER — MIDAZOLAM HCL 10 MG/2ML IJ SOLN
INTRAMUSCULAR | Status: AC
  Filled 2011-12-15: qty 4

## 2011-12-15 MED ORDER — DIPHENHYDRAMINE HCL 50 MG/ML IJ SOLN
INTRAMUSCULAR | Status: AC
  Filled 2011-12-15: qty 1

## 2011-12-15 NOTE — Progress Notes (Signed)
Subjective: Awaiting Colonoscopy No more bleeding Count ok No abd pain  Objective: Vital signs in last 24 hours: Temp:  [96.7 F (35.9 C)-98.9 F (37.2 C)] 96.7 F (35.9 C) (03/23 0517) Pulse Rate:  [61-82] 73  (03/23 0517) Resp:  [18-27] 18  (03/23 0517) BP: (103-157)/(57-78) 157/78 mmHg (03/23 0517) SpO2:  [93 %-97 %] 97 % (03/23 0517) Weight:  [94.7 kg (208 lb 12.4 oz)] 94.7 kg (208 lb 12.4 oz) (03/23 0517) Weight change: 4.888 kg (10 lb 12.4 oz) Last BM Date: 12/14/11  Intake/Output from previous day: 03/22 0701 - 03/23 0700 In: 210.9 [I.V.:210.9] Out: 900 [Urine:900] Intake/Output this shift:    General appearance: alert Cardio: regular rate and rhythm, S1, S2 normal, no murmur, click, rub or gallop GI: soft, non-tender; bowel sounds normal; no masses,  no organomegaly  Lab Results:  Basename 12/15/11 0600 12/14/11 0640  WBC 9.3 10.7*  HGB 11.5* 10.7*  HCT 35.2* 32.7*  PLT 276 219   BMET  Basename 12/14/11 0640 12/14/11 0001  NA 137 139  K 3.4* 3.7  CL 104 104  CO2 26 29  GLUCOSE 84 105*  BUN 37* 38*  CREATININE 1.69* 1.68*  CALCIUM 8.7 9.0    Studies/Results: No results found.  Medications: I have reviewed the patient's current medications.  Assessment/Plan: LGI bleed colooscopy today If negative may be d/c With f/u dr Linda Hedges  LOS: 2 days   [facility] 12/15/2011, 10:21 AM

## 2011-12-15 NOTE — Op Note (Signed)
[facility] [place], [place]  [id number]  OPERATIVE PROCEDURE REPORT  PATIENT:  [name], [name]  MR#:  [id number] BIRTHDATE:  1941/07/21  GENDER:  female ENDOSCOPIST:  Dr. Edmonia James, MD ASSISTANT:  Tsosie Billing, technician and Berenda Morale, RN.  PROCEDURE DATE:  12/15/2011 PRE-PROCEDURE PREPERATION:  Moviprep was taken as instructed (32 ounces the night prior to the procedure and 32 ounces the morning of the procedure).  The patient was fasted for four hours prior to the procedure. PRE-PROCEDURE PHYSICAL:  Patient has stable vital signs. Neck is supple.  There is no JVD, thyromegaly or LAD. Chest clear to auscultation. S1 and S2 regular. Abdomen soft, obese, non-distended, non-tender with NABS. PROCEDURE:  Colonoscopy with cold biopsies x 4 and hot snare polypectomy x 3. ASA CLASS:  Class III INDICATIONS:  1) Rectal bleeding 2) Anemia 3) Colorectal cancer screening. MEDICATIONS:  Fentanyl 62.5 mcg & Versed 8 mg IV.  DESCRIPTION OF PROCEDURE: After the risks, benefits, and alternatives of the procedure were thoroughly explained [including a 10% missed rate of cancer and polyps], informed consent was obtained.  Digital rectal exam was performed.  The Pentax Colonoscope [id number] was introduced through the anus and advanced to the cecum, which was identified by both the appendix and ileocecal valve, limited by a redundant colon.    The quality of the prep was good, using MoviPrep. Multiple washes were done. Small lesions could be missed. The instrument was then slowly withdrawn as the colon was fully examined. <<PROCEDUREIMAGES>>  FINDINGS: There was one sessile polyp at 20 cm, measuring about 6-7 mm and this was removed by a hot snare x 1 [150/15]. To small sessile polyps were removed by hot snare x 2 [150/15] from the right colon and left colon, respectively. One small sessile cecal polyp was removed by cold biopsy x 1. An inflamed fold at 50  cm was biopsied to rule out ischemic colitis. A few scattered diverticula were noted. Granulation tissue was noted at 30 cm ?recent divertiicular bleed. The rest of the colonic mucosa appeared healthy with a normal vascular pattern.  No masses or AVM's were noted.  The appendiceal orifice and the ICV were identified and photographed. Retroflexed views revealed no abnormalities. The patient tolerated the procedure without immediate complications.  The scope was then withdrawn from the patient and the procedure terminated.  IMPRESSION:  1) Scattered diverticulosis. 2) Erythematous fold at 50 cm-biopsied to rule out ischemic colitis. 3) Multiple colonic polyps removed [see descripton above].  RECOMMENDATIONS:  1) Await pathology results. 2) Avoid all NSAIDS for the next 2 weeks. 3) Continue surveillance. 4) High fiber diet with liberal fluid intake. 5) Out patient follow-up in 2 weeks.  REPEAT EXAM:   In 3 years, depending results; in case the patient has any abnormal GI symptoms in the interim, she should contact the office immediately for further recommendations.  DISCHARGE INSTRUCTIONS:  Standard discharge instructions given.  ______________________________ Dr. Edmonia James, MD  CPT CODES: [phone]  DIAGNOSIS CODES:  211.3, 569.3, 562.10, V76.51  CC:  Ranelle Oyster, M.D.  n. eSIGNED:   Dr. Edmonia James at 12/15/2011 01:12 PM  [name], [id number]

## 2011-12-15 NOTE — Progress Notes (Signed)
Patient came back from colonoscopy around 13:30pm. Patient sleepy but arousable. Patient denies pain.

## 2011-12-16 DIAGNOSIS — K5731 Diverticulosis of large intestine without perforation or abscess with bleeding: Secondary | ICD-10-CM | POA: Diagnosis not present

## 2011-12-16 DIAGNOSIS — I10 Essential (primary) hypertension: Secondary | ICD-10-CM | POA: Diagnosis not present

## 2011-12-16 DIAGNOSIS — E785 Hyperlipidemia, unspecified: Secondary | ICD-10-CM | POA: Diagnosis not present

## 2011-12-16 DIAGNOSIS — E119 Type 2 diabetes mellitus without complications: Secondary | ICD-10-CM | POA: Diagnosis not present

## 2011-12-16 DIAGNOSIS — N189 Chronic kidney disease, unspecified: Secondary | ICD-10-CM | POA: Diagnosis not present

## 2011-12-16 DIAGNOSIS — M316 Other giant cell arteritis: Secondary | ICD-10-CM | POA: Diagnosis not present

## 2011-12-16 MED ORDER — PANTOPRAZOLE SODIUM 40 MG PO TBEC: 40.0000 mg | DELAYED_RELEASE_TABLET | Freq: Every day | ORAL | Status: DC

## 2011-12-16 NOTE — Progress Notes (Signed)
Subjective: CROSS COVER [facility]-GI Since I last evaluated the patient, she seems to be doing very well. She denies any active GI issues at this time. She is anxious to go home.  Objective: Vital signs in last 24 hours: Temp:  [97.8 F (36.6 C)-98.5 F (36.9 C)] 97.8 F (36.6 C) (03/24 0532) Pulse Rate:  [69-76] 69  (03/24 0532) Resp:  [16-26] 18  (03/24 0532) BP: (100-149)/(47-81) 134/80 mmHg (03/24 0532) SpO2:  [91 %-100 %] 95 % (03/24 0532) Last BM Date: 12/15/11  Intake/Output from previous day: 03/23 0701 - 03/24 0700 In: 540 [P.O.:240; I.V.:300] Out: 600 [Urine:600] Intake/Output this shift:    General appearance: alert, cooperative, appears stated age and no distress Resp: clear to auscultation bilaterally Cardio: regular rate and rhythm, S1, S2 normal, no murmur, click, rub or gallop GI: soft, non-tender; bowel sounds normal; no masses,  no organomegaly  Lab Results:  Basename 12/15/11 0600 12/14/11 0640 12/14/11 0001  WBC 9.3 10.7* 13.4*  HGB 11.5* 10.7* 11.2*  HCT 35.2* 32.7* 34.7*  PLT 276 219 247   BMET  Basename 12/14/11 0640 12/14/11 0001 12/13/11 2205 12/13/11 2140  NA 137 139 142 --  K 3.4* 3.7 3.8 --  CL 104 104 109 --  CO2 26 29 -- 26  GLUCOSE 84 105* 99 --  BUN 37* 38* 39* --  CREATININE 1.69* 1.68* 1.60* --  CALCIUM 8.7 9.0 -- 9.1   LFT  Basename 12/14/11 0640  PROT 5.4*  ALBUMIN 3.0*  AST 15  ALT 15  ALKPHOS 52  BILITOT 0.5  BILIDIR --  IBILI --   PT/INR  Basename 12/13/11 2140  LABPROT 13.7  INR 1.03   Hepatitis Panel No results found for this basename: HEPBSAG,HCVAB,HEPAIGM,HEPBIGM in the last 72 hours C-Diff No results found for this basename: CDIFFTOX:3 in the last 72 hours Fecal Lactopherrin No results found for this basename: FECLLACTOFRN in the last 72 hours  Studies/Results: No results found.  Medications: I have reviewed the patient's current medications.  Assessment/Plan: 1) Anemia s/p GI bleed thought to be from  diverticulosis. Patient has been advised to take her iron supplements and follow up with Dr. Andrey Cota in 1-2 weeks. She is to be discharged today. 2) Multiple colonic polyps removed: Await pathology results.   3) GERD: On Pantoprazole.   LOS: 3 days   Chrishonda Hesch 12/16/2011, 8:16 AM

## 2011-12-16 NOTE — Discharge Summary (Signed)
Physician Discharge Summary  Patient ID: [name] MRN: [id number] DOB/AGE: 72/23/1942 72 y.o.  Admit date: 12/13/2011 Discharge date: 12/16/2011  Admission Diagnoses:  Discharge Diagnoses:  Principal Problem:  Diverticular bleed Colon polyp/ colonoscopy done - Dr Collene Mares GI Active Problems:  DIABETES MELLITUS, TYPE II  HYPERLIPIDEMIA  HYPERTENSION  DIVERTICULOSIS, COLON  Chronic renal insufficiency, stage II (mild)   Discharged Condition: good  Hospital Course: 71 years old female, admitted with lower GI bleed, hypertension no abdominal pain. GI #1: Lower GI bleed diverticular in nature patient had no more bleeding, hemoglobin remained stable did not require any blood transfusion. GI consulted she underwent colonoscopy,with recent bleed but no active bleeding diverticulosis, colonic polyps, pathology is pending. She had no more further bleeding. Her aspirin and Naprosyn was discontinued. She was started on PPI, because of her chronic steroid use and GERD, GI recommended continue the PPI. Hemodynamically remained stable. All the blood work remained stable. Other medical issues including chronic kidney disease pulmonic fibrosis, hypertension, diabetes all stable. Patient will follow up with PCP in one week  Consults: GI  Significant Diagnostic Studies: labs: White count of 9.3, hemoglobin 11.5. Cardiac markers negative, blood chemistries, creatinine 1.6, sodium 137, percussion 3.4 BUN of 37. and endoscopy: colonoscopy: Diverticulosis, colonic polyps  Treatments: IV hydration  Discharge Exam: Blood pressure 134/80, pulse 69, temperature 97.8 F (36.6 C), temperature source Oral, resp. rate 18, height 5\' 2"  (1.575 m), weight 94.7 kg (208 lb 12.4 oz), SpO2 95.00%. General appearance: alert Resp: clear to auscultation bilaterally Cardio: regular rate and rhythm GI: soft, non-tender; bowel sounds normal; no masses,  no organomegaly  Disposition: 01-Home or Self  Care  Discharge Orders    Future Orders Please Complete By Expires   Diet - low sodium heart healthy      Diet - low sodium heart healthy      Diet - low sodium heart healthy      Increase activity slowly      Increase activity slowly      Increase activity slowly        Medication List  As of 12/16/2011 10:44 AM   STOP taking these medications         aspirin 81 MG EC tablet      naproxen sodium 550 MG tablet         TAKE these medications         amLODipine 10 MG tablet   Commonly known as: NORVASC   Take 1 tablet (10 mg total) by mouth daily.      Calcium-Magnesium-Zinc 1000-500-50 MG Tabs   Take by mouth daily.      cholecalciferol 1000 UNITS tablet   Commonly known as: VITAMIN D   Take 1,000 Units by mouth daily.      famotidine 20 MG tablet   Commonly known as: PEPCID   Take 20 mg by mouth daily.      ferrous sulfate 325 (65 FE) MG tablet   Take 325 mg by mouth daily.      furosemide 40 MG tablet   Commonly known as: LASIX   Take 1 tablet (40 mg total) by mouth daily.      gabapentin 300 MG capsule   Commonly known as: NEURONTIN   Take 1 capsule (300 mg total) by mouth 2 (two) times daily.      magnesium (amino acid chelate) 133 MG tablet   Take 1 tablet by mouth 2 (two) times daily.  methocarbamol 750 MG tablet   Commonly known as: ROBAXIN   Take 750 mg by mouth 2 (two) times daily.      multivitamin,tx-minerals tablet   Take 1 tablet by mouth daily.      Oxycodone HCl 10 MG Tabs   Take 1 tablet by mouth every 4 (four) hours as needed. Breakthrough pain.      oxyCODONE 10 MG 12 hr tablet   Commonly known as: OXYCONTIN   Take 10 mg by mouth 2 (two) times daily. Fill on or After March 04, 2012.      pantoprazole 40 MG tablet   Commonly known as: PROTONIX   Take 1 tablet (40 mg total) by mouth daily.      penicillin v potassium 500 MG tablet   Commonly known as: VEETID   Take 500 mg by mouth 4 (four) times daily.      predniSONE 10 MG  tablet   Commonly known as: DELTASONE   Take 10 mg by mouth See admin instructions. Take with 2.5 mg tablet for a dose of 12.5 daily      predniSONE 2.5 MG tablet   Commonly known as: DELTASONE   Take 2.5 mg by mouth See admin instructions. Take with 10mg  for a dose of 12.5mg  daily      PROLIA 60 MG/ML Soln injection   Generic drug: denosumab   Inject 60 mg into the skin every 6 (six) months.      simvastatin 5 MG tablet   Commonly known as: ZOCOR   Take 1 tablet (5 mg total) by mouth at bedtime.           Follow-up Information    Follow up with Adella Hare, MD in 1 week.         SignedWenda Low 12/16/2011, 10:44 AM

## 2011-12-16 NOTE — Progress Notes (Signed)
Gave pt discharge instructions and prescription and explained them to her. Pt verbalized understanding of instructions given. Pt walked out with tech in no acute distress.

## 2011-12-17 ENCOUNTER — Encounter (HOSPITAL_COMMUNITY): Payer: Self-pay | Admitting: Gastroenterology

## 2011-12-17 LAB — TYPE AND SCREEN
ABO/RH(D): O POS
Antibody Screen: NEGATIVE
Unit division: 0

## 2011-12-19 ENCOUNTER — Ambulatory Visit: Payer: Medicare Other | Admitting: Psychology

## 2011-12-24 ENCOUNTER — Ambulatory Visit (INDEPENDENT_AMBULATORY_CARE_PROVIDER_SITE_OTHER): Payer: Medicare Other | Admitting: Internal Medicine

## 2011-12-24 ENCOUNTER — Encounter: Payer: Self-pay | Admitting: Internal Medicine

## 2011-12-24 VITALS — BP 124/72 | HR 89 | Temp 98.5°F | Resp 16 | Wt 205.8 lb

## 2011-12-24 DIAGNOSIS — K922 Gastrointestinal hemorrhage, unspecified: Secondary | ICD-10-CM | POA: Diagnosis not present

## 2011-12-24 DIAGNOSIS — K625 Hemorrhage of anus and rectum: Secondary | ICD-10-CM

## 2011-12-24 NOTE — Patient Instructions (Signed)
The evidence is most likely a diverticular bleed vs ischemic colitis based on a biopsy done by Dr. Collene Mares. Both are almost always self-limited and there is no way to predict a recurrence. Will need to check a blood count in 4-6 weeks. You should have iron rich foods: raisins, prunes, leafy greens, lean red meat, liver.    Gastrointestinal Bleeding Gastrointestinal (GI) bleeding is bleeding from the gut or any place between your mouth and anus. If bleeding is slow, you may be allowed to go home. If there is a lot of bleeding, hospitalization and observation are often required. SYMPTOMS    You vomit bright red blood or material that looks like coffee grounds.   You have blood in your stools or the stools look black and tarry.  DIAGNOSIS   Your caregiver may diagnose your condition by taking a history and a physical exam. More tests may be needed, including:  X-rays.   EGD (esophagogastroduodenoscopy), which looks at your esophagus, stomach, and small bowel through a flexible telescope-like instrument.   Colonoscopy, which looks at your colon/large bowel through a flexible telescope-like instrument.   Biopsies, which remove a small sample of tissue to examine under a microscope.  Finding out the results of your test Not all test results are available during your visit. If your test results are not back during the visit, make an appointment with your caregiver to find out the results. Do not assume everything is normal if you have not heard from your caregiver or the medical facility. It is important for you to follow up on all of your test results. HOME CARE INSTRUCTIONS    Follow instructions as suggested by your caregiver regarding medicines. Do not take aspirin, drink alcohol, or take medicines for pain and arthritis unless your caregiver says it is okay.   Get the suggested follow-up care when the tests are done.  SEEK IMMEDIATE MEDICAL CARE IF:    Your bleeding increases or you become  lightheaded, weak, or pass out (faint).   You experience severe cramps in your stomach, back, or belly (abdomen).   You pass large clots.   The problems which brought you in for medical care get worse.  MAKE SURE YOU:    Understand these instructions.   Will watch your condition.   Will get help right away if you are not doing well or get worse.  Document Released: 09/07/2000 Document Revised: 08/30/2011 Document Reviewed: 08/20/2011 [facility] Patient Information 2012 [place], [place].

## 2011-12-24 NOTE — Assessment & Plan Note (Signed)
Lower GI bleed: diverticular vs ischemic colitis. At discharge Hgb 11.3  Plan - provided chpts from UpToDate on diverticular bleeds and ischemic colitis.           Gave copy of path report            Advised iron rich diet           F/u Hgb May 6th.

## 2011-12-24 NOTE — Progress Notes (Signed)
  Subjective:    Patient ID: [name], female    DOB: 01-07-1941, 71 y.o.   MRN: [id number]  HPI Ms. Warburton was doing well (see last OV note) but on March 21st she developed painless hematochezia. She had no abdominal pain, no fever, no cramping. She does admit to tenesmus. She was admitted to [facility]. She had a two (2) gram drop in hemoglobin to 10.7 - no transfusion was needed. She was seen by Dr. Collene Mares - colonoscopy revealed diverticulosis but no active bleeding site. Path reviewed: 2 tubular adenomatous polyps, 1 hyperplastic polyp and biopsy with ischemic colitis.   Since d/c she has done well with no recurrent bleeding and no symptoms.  PMH, FamHx and SocHx reviewed for any changes and relevance.    Review of Systems System review is negative for any constitutional, cardiac, pulmonary, GI or neuro symptoms or complaints other than as described in the HPI.     Objective:   Physical Exam Filed Vitals:   12/24/11 1100  BP: 124/72  Pulse: 89  Temp: 98.5 F (36.9 C)  Resp: 16   Gen'l- WNWD white woman in good spirits Cor - RRR, 2+ RADIAL pulses Pulm - normal respirations.        Assessment & Plan:

## 2011-12-25 DIAGNOSIS — M069 Rheumatoid arthritis, unspecified: Secondary | ICD-10-CM | POA: Diagnosis not present

## 2011-12-25 DIAGNOSIS — N189 Chronic kidney disease, unspecified: Secondary | ICD-10-CM | POA: Diagnosis not present

## 2011-12-25 DIAGNOSIS — M316 Other giant cell arteritis: Secondary | ICD-10-CM | POA: Diagnosis not present

## 2011-12-31 DIAGNOSIS — D509 Iron deficiency anemia, unspecified: Secondary | ICD-10-CM | POA: Diagnosis not present

## 2011-12-31 DIAGNOSIS — N179 Acute kidney failure, unspecified: Secondary | ICD-10-CM | POA: Diagnosis not present

## 2011-12-31 DIAGNOSIS — I10 Essential (primary) hypertension: Secondary | ICD-10-CM | POA: Diagnosis not present

## 2012-01-29 ENCOUNTER — Other Ambulatory Visit (INDEPENDENT_AMBULATORY_CARE_PROVIDER_SITE_OTHER): Payer: Medicare Other

## 2012-01-29 ENCOUNTER — Other Ambulatory Visit: Payer: Self-pay | Admitting: *Deleted

## 2012-01-29 DIAGNOSIS — K625 Hemorrhage of anus and rectum: Secondary | ICD-10-CM | POA: Diagnosis not present

## 2012-01-29 DIAGNOSIS — M316 Other giant cell arteritis: Secondary | ICD-10-CM

## 2012-01-29 LAB — CBC WITH DIFFERENTIAL/PLATELET
Basophils Relative: 0.4 % (ref 0.0–3.0)
Eosinophils Absolute: 0.4 10*3/uL (ref 0.0–0.7)
Eosinophils Relative: 3.5 % (ref 0.0–5.0)
HCT: 40.2 % (ref 36.0–46.0)
Hemoglobin: 13.5 g/dL (ref 12.0–15.0)
Lymphs Abs: 2.5 10*3/uL (ref 0.7–4.0)
MCHC: 33.5 g/dL (ref 30.0–36.0)
MCV: 98.3 fl (ref 78.0–100.0)
Monocytes Absolute: 1 10*3/uL (ref 0.1–1.0)
Neutro Abs: 6.3 10*3/uL (ref 1.4–7.7)
RBC: 4.09 Mil/uL (ref 3.87–5.11)
WBC: 10.2 10*3/uL (ref 4.5–10.5)

## 2012-02-05 ENCOUNTER — Telehealth: Payer: Self-pay | Admitting: *Deleted

## 2012-02-05 NOTE — Telephone Encounter (Signed)
Patient notified of normal lab results.  sue

## 2012-02-05 NOTE — Telephone Encounter (Signed)
Patient notified of lab results normal.  [name]

## 2012-02-28 ENCOUNTER — Telehealth: Payer: Self-pay | Admitting: Internal Medicine

## 2012-02-28 ENCOUNTER — Encounter: Payer: Self-pay | Admitting: Internal Medicine

## 2012-02-28 ENCOUNTER — Ambulatory Visit (INDEPENDENT_AMBULATORY_CARE_PROVIDER_SITE_OTHER): Payer: Medicare Other | Admitting: Internal Medicine

## 2012-02-28 VITALS — BP 108/60 | HR 84 | Temp 98.5°F | Resp 16 | Ht 62.0 in | Wt 197.0 lb

## 2012-02-28 DIAGNOSIS — IMO0002 Reserved for concepts with insufficient information to code with codable children: Secondary | ICD-10-CM | POA: Diagnosis not present

## 2012-02-28 DIAGNOSIS — S76219A Strain of adductor muscle, fascia and tendon of unspecified thigh, initial encounter: Secondary | ICD-10-CM

## 2012-02-28 MED ORDER — OXYCODONE HCL 10 MG PO TB12: ORAL_TABLET | ORAL | Status: DC

## 2012-02-28 MED ORDER — OXYCODONE HCL 10 MG PO TABS: 10.0000 mg | ORAL_TABLET | ORAL | Status: DC | PRN

## 2012-02-28 NOTE — Progress Notes (Signed)
  Subjective:    Patient ID: [name], female    DOB: [birth year], 71 y.o.   MRN: [id number]  HPI In the interval since her last visit [name] has had a nice trip to the beach with her sisters. She did walk more than usual and is now c/o pain at the medial, proximal right LE.  She is due for refill of her pain medications. She has been stable and well controlled. She denies any hypersomnolence, clouded mentation or constipation.  PMH, FamHx and SocHx reviewed for any changes and relevance.    Review of Systems System review is negative for any constitutional, cardiac, pulmonary, GI or neuro symptoms or complaints other than as described in the HPI.     Objective:   Physical Exam Filed Vitals:   02/28/12 1636  BP: 108/60  Pulse: 84  Temp: 98.5 F (36.9 C)  Resp: 16   Gen'l - overweight white woman in no distress Cor - RRR Pulm - normal respirations. MSK - normal ROM right hip. Tender to palpation at the very proximal,medial right LE at the tendon of the vastus medialis and the psoas.       Assessment & Plan:  Groin strain - injury from too much walking.  Plan - NSAIDs,           Lineament of choice

## 2012-02-28 NOTE — Telephone Encounter (Signed)
The pt is hoping to get a proliea injection this month.  Can this be ordered for her? Thanks!

## 2012-03-02 NOTE — Assessment & Plan Note (Signed)
Renewed pain meds

## 2012-03-28 DIAGNOSIS — Z1231 Encounter for screening mammogram for malignant neoplasm of breast: Secondary | ICD-10-CM | POA: Diagnosis not present

## 2012-03-31 ENCOUNTER — Telehealth: Payer: Self-pay | Admitting: Internal Medicine

## 2012-03-31 DIAGNOSIS — I10 Essential (primary) hypertension: Secondary | ICD-10-CM | POA: Diagnosis not present

## 2012-03-31 DIAGNOSIS — N2581 Secondary hyperparathyroidism of renal origin: Secondary | ICD-10-CM | POA: Diagnosis not present

## 2012-03-31 DIAGNOSIS — D509 Iron deficiency anemia, unspecified: Secondary | ICD-10-CM | POA: Diagnosis not present

## 2012-03-31 DIAGNOSIS — N179 Acute kidney failure, unspecified: Secondary | ICD-10-CM | POA: Diagnosis not present

## 2012-03-31 DIAGNOSIS — M316 Other giant cell arteritis: Secondary | ICD-10-CM

## 2012-03-31 NOTE — Telephone Encounter (Signed)
She is overdue for her sed rate check and was wanting order faxed to lab and let her know when she can come get it done.  Thanks

## 2012-04-01 DIAGNOSIS — M316 Other giant cell arteritis: Secondary | ICD-10-CM | POA: Insufficient documentation

## 2012-04-01 NOTE — Telephone Encounter (Signed)
oder placed. Come by anytime

## 2012-04-01 NOTE — Telephone Encounter (Signed)
Patient notified lab orders in system for her lab work

## 2012-04-03 ENCOUNTER — Other Ambulatory Visit (INDEPENDENT_AMBULATORY_CARE_PROVIDER_SITE_OTHER): Payer: Medicare Other

## 2012-04-03 DIAGNOSIS — M316 Other giant cell arteritis: Secondary | ICD-10-CM

## 2012-04-03 LAB — SEDIMENTATION RATE: Sed Rate: 10 mm/hr (ref 0–22)

## 2012-04-07 ENCOUNTER — Encounter: Payer: Self-pay | Admitting: Internal Medicine

## 2012-04-08 ENCOUNTER — Telehealth: Payer: Self-pay | Admitting: Internal Medicine

## 2012-04-08 ENCOUNTER — Telehealth: Payer: Self-pay | Admitting: *Deleted

## 2012-04-08 NOTE — Telephone Encounter (Signed)
Per Epic message Dr Linda Hedges, advised Sed rate was 10 (normal), the lowest its been, and copy will be sent to Dr [name].

## 2012-04-08 NOTE — Telephone Encounter (Signed)
Patient notified of labs and copy sent to Dr. [name] office

## 2012-04-08 NOTE — Telephone Encounter (Signed)
Message copied by Ellene Route on Tue Apr 08, 2012  2:07 PM ------      Message from: Neena Rhymes      Created: Sun Apr 06, 2012  4:39 AM       Send copy of sed rate report to Dr. [name]. Call patient - sed rate is 10, the lowest it has been in over a year.

## 2012-04-08 NOTE — Telephone Encounter (Signed)
Error selecting correct office location for below message.   Message resent to correct office site: Per Epic message 04/06/12 from Dr Linda Hedges, advised Sed rate was 10 (normal), the lowest its been for > 1 yr.  Copy will be sent to Dr [name].

## 2012-04-08 NOTE — Telephone Encounter (Signed)
I am confused! Has the patient been notified! Was a copy sent to Dr. [name]?

## 2012-04-09 ENCOUNTER — Ambulatory Visit (INDEPENDENT_AMBULATORY_CARE_PROVIDER_SITE_OTHER): Payer: Medicare Other

## 2012-04-09 DIAGNOSIS — M810 Age-related osteoporosis without current pathological fracture: Secondary | ICD-10-CM | POA: Diagnosis not present

## 2012-04-09 MED ORDER — DENOSUMAB 60 MG/ML ~~LOC~~ SOLN
60.0000 mg | Freq: Once | SUBCUTANEOUS | Status: AC
  Administered 2012-04-09: 60 mg via SUBCUTANEOUS

## 2012-04-09 NOTE — Telephone Encounter (Signed)
Message completed, see other note, same day.

## 2012-04-17 DIAGNOSIS — H902 Conductive hearing loss, unspecified: Secondary | ICD-10-CM | POA: Diagnosis not present

## 2012-04-17 DIAGNOSIS — H6120 Impacted cerumen, unspecified ear: Secondary | ICD-10-CM | POA: Diagnosis not present

## 2012-04-18 DIAGNOSIS — IMO0002 Reserved for concepts with insufficient information to code with codable children: Secondary | ICD-10-CM | POA: Diagnosis not present

## 2012-04-24 ENCOUNTER — Encounter: Payer: Self-pay | Admitting: Internal Medicine

## 2012-04-24 ENCOUNTER — Ambulatory Visit (INDEPENDENT_AMBULATORY_CARE_PROVIDER_SITE_OTHER): Payer: Medicare Other | Admitting: Internal Medicine

## 2012-04-24 VITALS — BP 154/88 | HR 71 | Temp 98.4°F | Resp 16 | Wt 200.0 lb

## 2012-04-24 DIAGNOSIS — S46819A Strain of other muscles, fascia and tendons at shoulder and upper arm level, unspecified arm, initial encounter: Secondary | ICD-10-CM | POA: Diagnosis not present

## 2012-04-24 DIAGNOSIS — S46119A Strain of muscle, fascia and tendon of long head of biceps, unspecified arm, initial encounter: Secondary | ICD-10-CM

## 2012-04-24 DIAGNOSIS — S43499A Other sprain of unspecified shoulder joint, initial encounter: Secondary | ICD-10-CM

## 2012-04-24 NOTE — Progress Notes (Signed)
Subjective:    Patient ID: [name], female    DOB: 01-13-1941, 71 y.o.   MRN: [id number]  HPI [name] is seen on an urgent basis for for marked hematoma left proximal UE. She does not recall any injury but did have some shoulder strain a week ago and has had a cortisone injection left shoulder by Dr. Harrietta Guardian PA Mikki Santee. Yesterday she felt pain in the left biceps, had swelling and a firmness on palpation and then bruising. She has a long h/o steroid use. She has normal sensation, normal movement of the arm.  Past Medical History  Diagnosis Date  . History of breast cancer   . Osteoporosis   . Idiopathic pulmonary fibrosis   . Bronchiectasis   . Vitamin d deficiency   . Hyperlipidemia   . Allergic rhinitis   . GERD (gastroesophageal reflux disease)   . Left knee DJD   . Anxiety   . Migraine   . Diverticulosis of colon   . Seizure disorder   . Temporal arteritis     Right eye blind, on steroids per [facility]/ Dr [name]  . CVA (cerebral infarction) 10/2007    Right thalamic   . PMR (polymyalgia rheumatica)   . Anemia   . Gait disorder   . Peripheral neuropathy   . HTN (hypertension)   . Complication of anesthesia     slow to wake up  . Type II or unspecified type diabetes mellitus without mention of complication, not stated as uncontrolled     2nd to steriods  . Renal insufficiency    Past Surgical History  Procedure Date  . Mastectomy   . Cholecystectomy   . Total knee arthroplasty     Left  . Tonsillectomy   . Cataract extraction     OS - Summer 2010  . Lumbar fusion 04/2010    W/Mechanical fixation - [facility]  . Spinal fusion 07/2010    T10-L2 interbody fusion / [facility]  . Colonoscopy 12/15/2011    Procedure: COLONOSCOPY;  Surgeon: [name], MD;  Location: [facility];  Service: Endoscopy;  Laterality: N/A;   Family History  Problem Relation Age of Onset  . Brain cancer Mother   . Hypertension Mother   . Dementia Father   .  Hypertension Father   . Breast cancer Sister    History   Social History  . Marital Status: Widowed    Spouse Name: N/A    Number of Children: 1  . Years of Education: N/A   Occupational History  . Land    Social History Main Topics  . Smoking status: Never Smoker   . Smokeless tobacco: Never Used  . Alcohol Use: No     heavy drinker until 1995 - sobriety with AA  . Drug Use: No  . Sexually Active: Not Currently   Other Topics Concern  . Not on file   Social History Narrative      Married - husband invalid with stroke - widowed in 2009. 1 chil. 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 ACP - not formerly discussed - wishes to be a full code.     Current Outpatient Prescriptions on File Prior to Visit  Medication Sig Dispense Refill  . amLODipine (NORVASC) 10 MG tablet Take 1 tablet (10 mg total) by mouth daily.  90 tablet  3  . Calcium-Magnesium-Zinc 1000-500-50 MG TABS Take by mouth daily.        Marland Kitchen  cholecalciferol (VITAMIN D) 1000 UNITS tablet Take 1,000 Units by mouth daily.        Marland Kitchen denosumab (PROLIA) 60 MG/ML SOLN Inject 60 mg into the skin every 6 (six) months.        . famotidine (PEPCID) 20 MG tablet Take 20 mg by mouth daily.       . ferrous sulfate 325 (65 FE) MG tablet Take 325 mg by mouth daily.       . furosemide (LASIX) 40 MG tablet Take 1 tablet (40 mg total) by mouth daily.  90 tablet  3  . gabapentin (NEURONTIN) 300 MG capsule Take 1 capsule (300 mg total) by mouth 2 (two) times daily.  180 capsule  3  . methocarbamol (ROBAXIN) 750 MG tablet Take 750 mg by mouth 2 (two) times daily.      . Multiple Vitamins-Minerals (MULTIVITAMIN,TX-MINERALS) tablet Take 1 tablet by mouth daily.        Marland Kitchen oxyCODONE (OXYCONTIN) 10 MG 12 hr tablet Take 10 mg by mouth 2 (two) times daily. Fill on or After March 04, 2012.  60 tablet  0  . Oxycodone HCl 10 MG TABS Take 1 tablet (10 mg total) by mouth every 4  (four) hours as needed. Breakthrough pain.  30 tablet  0  . predniSONE (DELTASONE) 10 MG tablet Take 10 mg by mouth See admin instructions. Take with 2.5 mg tablet for a dose of 12.5 daily      . simvastatin (ZOCOR) 5 MG tablet Take 1 tablet (5 mg total) by mouth at bedtime.  90 tablet  3      Review of Systems System review is negative for any constitutional, cardiac, pulmonary, GI or neuro symptoms or complaints other than as described in the HPI.     Objective:   Physical Exam Filed Vitals:   04/24/12 1032  BP: 154/88  Pulse: 71  Temp: 98.4 F (36.9 C)  Resp: 16   Gen'l- WNWD white woman in no distress MSk- upper left arm with ecchymosis that is circumferential. There is a bulging of the proximal biceps when at rest and worse with tension against the biceps       Assessment & Plan:  Biceps rupture - probable rupture of the short-head of the biceps with subsequent bleeding into the muscle. Normal sensation   Plan - now appointment with Bob,PA at [facility] to confirm diagnosis and advise as to whether she should start PT.

## 2012-05-05 DIAGNOSIS — I10 Essential (primary) hypertension: Secondary | ICD-10-CM | POA: Diagnosis not present

## 2012-05-05 DIAGNOSIS — N179 Acute kidney failure, unspecified: Secondary | ICD-10-CM | POA: Diagnosis not present

## 2012-05-05 DIAGNOSIS — D509 Iron deficiency anemia, unspecified: Secondary | ICD-10-CM | POA: Diagnosis not present

## 2012-05-06 DIAGNOSIS — D239 Other benign neoplasm of skin, unspecified: Secondary | ICD-10-CM | POA: Diagnosis not present

## 2012-05-06 DIAGNOSIS — L821 Other seborrheic keratosis: Secondary | ICD-10-CM | POA: Diagnosis not present

## 2012-05-06 DIAGNOSIS — L570 Actinic keratosis: Secondary | ICD-10-CM | POA: Diagnosis not present

## 2012-05-12 DIAGNOSIS — M66329 Spontaneous rupture of flexor tendons, unspecified upper arm: Secondary | ICD-10-CM | POA: Diagnosis not present

## 2012-05-27 DIAGNOSIS — M316 Other giant cell arteritis: Secondary | ICD-10-CM | POA: Diagnosis not present

## 2012-05-27 DIAGNOSIS — M069 Rheumatoid arthritis, unspecified: Secondary | ICD-10-CM | POA: Diagnosis not present

## 2012-05-27 DIAGNOSIS — N189 Chronic kidney disease, unspecified: Secondary | ICD-10-CM | POA: Diagnosis not present

## 2012-06-04 ENCOUNTER — Telehealth: Payer: Self-pay | Admitting: Internal Medicine

## 2012-06-04 NOTE — Telephone Encounter (Signed)
Caller: [name]/Patient; Phone: [phone]; Reason for Call: Patient calling for a refill on her 2 medications Oxycontin & Oxycodone.  Wants to know if she can come by the office tomorrow 06/05/12 to pick up the prescriptions.  States she will be out of town for a while but shes coming to [facility] tomorrow for an eye appt.  Please call her back.  Thanks

## 2012-06-05 ENCOUNTER — Other Ambulatory Visit: Payer: Self-pay | Admitting: *Deleted

## 2012-06-05 DIAGNOSIS — H4720 Unspecified optic atrophy: Secondary | ICD-10-CM | POA: Diagnosis not present

## 2012-06-05 DIAGNOSIS — Z961 Presence of intraocular lens: Secondary | ICD-10-CM | POA: Diagnosis not present

## 2012-06-05 DIAGNOSIS — H5010 Unspecified exotropia: Secondary | ICD-10-CM | POA: Diagnosis not present

## 2012-06-05 DIAGNOSIS — H5440 Blindness, one eye, unspecified eye: Secondary | ICD-10-CM | POA: Diagnosis not present

## 2012-06-05 MED ORDER — OXYCODONE HCL 10 MG PO TB12: ORAL_TABLET | ORAL | Status: DC

## 2012-06-05 MED ORDER — OXYCODONE HCL 10 MG PO TABS: 10.0000 mg | ORAL_TABLET | ORAL | Status: DC | PRN

## 2012-06-05 NOTE — Telephone Encounter (Signed)
REFILLS PATIENT REQUESTED OXYCODONE AND OXYCONTIN PRINTED AND TO BE PICKED UP BY PATIENT.

## 2012-06-05 NOTE — Telephone Encounter (Signed)
Ok for refills

## 2012-06-09 ENCOUNTER — Other Ambulatory Visit: Payer: Self-pay | Admitting: Internal Medicine

## 2012-06-09 ENCOUNTER — Telehealth: Payer: Self-pay | Admitting: *Deleted

## 2012-06-09 DIAGNOSIS — M316 Other giant cell arteritis: Secondary | ICD-10-CM

## 2012-06-09 NOTE — Telephone Encounter (Signed)
Pt informed

## 2012-06-09 NOTE — Telephone Encounter (Signed)
k-order placed for [employer]

## 2012-06-09 NOTE — Telephone Encounter (Signed)
Pt wants to come in tomorrow to have Sed Rate checked since she hasn't had it checked in a while-pt will be in [place] tomorrow-please advise.

## 2012-06-10 ENCOUNTER — Other Ambulatory Visit (INDEPENDENT_AMBULATORY_CARE_PROVIDER_SITE_OTHER): Payer: Medicare Other

## 2012-06-10 DIAGNOSIS — M316 Other giant cell arteritis: Secondary | ICD-10-CM

## 2012-06-10 DIAGNOSIS — M66329 Spontaneous rupture of flexor tendons, unspecified upper arm: Secondary | ICD-10-CM | POA: Diagnosis not present

## 2012-06-10 LAB — SEDIMENTATION RATE: Sed Rate: 36 mm/hr — ABNORMAL HIGH (ref 0–22)

## 2012-06-12 ENCOUNTER — Telehealth: Payer: Self-pay | Admitting: Internal Medicine

## 2012-06-12 NOTE — Telephone Encounter (Signed)
Caller: Kennady/Patient; Phone: [phone]; Reason for Call: Please call pt to advise results of blood work, specifically SED rate.

## 2012-06-12 NOTE — Telephone Encounter (Signed)
PATIENT NOTIFIED OF ESr RESULTS 36. PATIENT CONCERNED AND DOES NOT UNDERSTAND WHY IT GOES UP. STATES WOULD LIKE A COPY SENT TO HER NUROLOGIST , DR. Lanny Hurst WILLIS  ? SINCE HE IS THE ONE WHO HAS TO ADJUST HER PREDNISONE DOSE BASED ON THIS RESULT.

## 2012-06-12 NOTE — Telephone Encounter (Signed)
ESr 36 - up from 10 at last testing.

## 2012-06-13 NOTE — Telephone Encounter (Signed)
Send dr [name] results

## 2012-06-24 DIAGNOSIS — M542 Cervicalgia: Secondary | ICD-10-CM | POA: Diagnosis not present

## 2012-06-24 DIAGNOSIS — Z981 Arthrodesis status: Secondary | ICD-10-CM | POA: Diagnosis not present

## 2012-06-24 DIAGNOSIS — M539 Dorsopathy, unspecified: Secondary | ICD-10-CM | POA: Diagnosis not present

## 2012-06-29 ENCOUNTER — Other Ambulatory Visit: Payer: Self-pay | Admitting: Internal Medicine

## 2012-06-30 ENCOUNTER — Other Ambulatory Visit: Payer: Self-pay | Admitting: Internal Medicine

## 2012-06-30 ENCOUNTER — Telehealth: Payer: Self-pay | Admitting: Internal Medicine

## 2012-06-30 MED ORDER — SIMVASTATIN 5 MG PO TABS: 5.0000 mg | ORAL_TABLET | Freq: Every day | ORAL | Status: DC

## 2012-06-30 MED ORDER — AMLODIPINE BESYLATE 10 MG PO TABS: 10.0000 mg | ORAL_TABLET | Freq: Every day | ORAL | Status: DC

## 2012-06-30 MED ORDER — PREDNISONE 10 MG PO TABS: 10.0000 mg | ORAL_TABLET | ORAL | Status: DC

## 2012-06-30 NOTE — Telephone Encounter (Signed)
Caller: Larose/Patient; Phone: [phone]; Reason for Call: Patient calling about her prescriptions for Prednisone 10mg , Simvastatin 5mg  and Amlodipine 10mg .  [facility] and [facility] stated it would be 07/09/12 before she received her medications.  Patient calling to see if Dr.  Linda Hedges would call in a few pills to last her for each medication to [place] [phone].  Please call her back.  Thanks

## 2012-06-30 NOTE — Telephone Encounter (Signed)
PATIENT NOTIFIED OF Rx SENT TO [facility].

## 2012-06-30 NOTE — Telephone Encounter (Signed)
Ok

## 2012-07-11 DIAGNOSIS — M542 Cervicalgia: Secondary | ICD-10-CM | POA: Insufficient documentation

## 2012-07-11 DIAGNOSIS — M316 Other giant cell arteritis: Secondary | ICD-10-CM | POA: Diagnosis not present

## 2012-07-11 DIAGNOSIS — M353 Polymyalgia rheumatica: Secondary | ICD-10-CM | POA: Diagnosis not present

## 2012-07-11 DIAGNOSIS — R209 Unspecified disturbances of skin sensation: Secondary | ICD-10-CM | POA: Insufficient documentation

## 2012-07-11 DIAGNOSIS — G63 Polyneuropathy in diseases classified elsewhere: Secondary | ICD-10-CM | POA: Diagnosis not present

## 2012-07-18 ENCOUNTER — Telehealth: Payer: Self-pay

## 2012-07-18 MED ORDER — FUROSEMIDE 40 MG PO TABS: 40.0000 mg | ORAL_TABLET | Freq: Every day | ORAL | Status: DC

## 2012-07-18 NOTE — Telephone Encounter (Signed)
Pt called requesting MD place orders for her to have her Sedimentation Rate checked.

## 2012-07-20 DIAGNOSIS — Z23 Encounter for immunization: Secondary | ICD-10-CM | POA: Diagnosis not present

## 2012-07-23 ENCOUNTER — Telehealth: Payer: Self-pay | Admitting: *Deleted

## 2012-07-23 DIAGNOSIS — M316 Other giant cell arteritis: Secondary | ICD-10-CM

## 2012-07-23 NOTE — Telephone Encounter (Signed)
Patient called request for lab work for her SED RATE to be ordered. Saw Dr. [name] last week and reccommended she get this done. Last sed rate was in 06/10/2012. Please advise.

## 2012-07-24 ENCOUNTER — Other Ambulatory Visit (INDEPENDENT_AMBULATORY_CARE_PROVIDER_SITE_OTHER): Payer: Medicare Other

## 2012-07-24 ENCOUNTER — Telehealth: Payer: Self-pay | Admitting: *Deleted

## 2012-07-24 DIAGNOSIS — M316 Other giant cell arteritis: Secondary | ICD-10-CM

## 2012-07-24 LAB — SEDIMENTATION RATE: Sed Rate: 24 mm/hr — ABNORMAL HIGH (ref 0–22)

## 2012-07-24 NOTE — Telephone Encounter (Signed)
There is an open order on file. She may come by any time

## 2012-07-24 NOTE — Telephone Encounter (Signed)
Lab work noted as future for sed rate

## 2012-07-24 NOTE — Telephone Encounter (Signed)
Patient notified of lab order for sed rate to be done .

## 2012-07-30 ENCOUNTER — Telehealth: Payer: Self-pay | Admitting: *Deleted

## 2012-07-30 NOTE — Telephone Encounter (Signed)
Lab results for patient of SedRate done 07/24/2012 faxed to [facility] Dr. Roxy Cedar.

## 2012-07-30 NOTE — Telephone Encounter (Signed)
Message copied by Ellene Route on Wed Jul 30, 2012  1:22 PM ------      Message from: Lyman Bishop      Created: Fri Jul 25, 2012  2:18 PM                   ----- Message -----         From: Neena Rhymes, MD         Sent: 07/25/2012   1:11 PM           To: Lyman Bishop, CMA            Sed rate 24. Good. Copy sent to Dr. [name]

## 2012-07-30 NOTE — Telephone Encounter (Signed)
Patient returned call and is aware of results of Sed Rate 24 and was faxed to Dr. [name].

## 2012-08-04 ENCOUNTER — Telehealth: Payer: Self-pay | Admitting: Internal Medicine

## 2012-08-04 ENCOUNTER — Encounter: Payer: Self-pay | Admitting: Internal Medicine

## 2012-08-04 ENCOUNTER — Ambulatory Visit (INDEPENDENT_AMBULATORY_CARE_PROVIDER_SITE_OTHER): Payer: Medicare Other | Admitting: Internal Medicine

## 2012-08-04 VITALS — BP 112/70 | HR 97 | Temp 98.6°F | Resp 16 | Wt 198.0 lb

## 2012-08-04 DIAGNOSIS — D509 Iron deficiency anemia, unspecified: Secondary | ICD-10-CM | POA: Diagnosis not present

## 2012-08-04 DIAGNOSIS — IMO0002 Reserved for concepts with insufficient information to code with codable children: Secondary | ICD-10-CM | POA: Diagnosis not present

## 2012-08-04 DIAGNOSIS — I10 Essential (primary) hypertension: Secondary | ICD-10-CM

## 2012-08-04 DIAGNOSIS — N179 Acute kidney failure, unspecified: Secondary | ICD-10-CM | POA: Diagnosis not present

## 2012-08-04 DIAGNOSIS — N2581 Secondary hyperparathyroidism of renal origin: Secondary | ICD-10-CM | POA: Diagnosis not present

## 2012-08-04 MED ORDER — OXYCODONE HCL 10 MG PO TB12: ORAL_TABLET | ORAL | Status: DC

## 2012-08-04 MED ORDER — GABAPENTIN 300 MG PO CAPS: 300.0000 mg | ORAL_CAPSULE | Freq: Four times a day (QID) | ORAL | Status: DC

## 2012-08-04 MED ORDER — PREDNISONE 5 MG PO TABS: 5.0000 mg | ORAL_TABLET | Freq: Every day | ORAL | Status: DC

## 2012-08-04 MED ORDER — OXYCODONE HCL 10 MG PO TABS: 10.0000 mg | ORAL_TABLET | ORAL | Status: DC | PRN

## 2012-08-04 NOTE — Patient Instructions (Addendum)
Low back pain with radiation to the legs - worrisome for recurrent disk disease.  Plan Increase gabapentin to 300 mg three times a day for 1 week and then if you can tolerate it increase to 300 mg 4 times a week.  Continue present dose of prednisone  Referral back to Dr. Harl Bowie for diagnostic evaluation and any treatment. He is best able to interpret any studies that may be done.

## 2012-08-04 NOTE — Progress Notes (Signed)
Subjective:    Patient ID: [name], female    DOB: 1941/03/04, 71 y.o.   MRN: [id number]  HPI [name] presents for evaluation of a two week history of low back pain that radiates to the buttock and posterior thighs bilaterally. She has tingling paresthesia in both legs, no change in strength. Her left leg has given way. She has had lumbar surgery x 2 with instrumentation. She has also had cervical spine surgery with instrumentation. All her surgery has been done by Dr. Harl Bowie at [facility]. She was last seen in his clinic about a month ago for neck pain - resolved with the use of a soft collar.   Past Medical History  Diagnosis Date  . History of breast cancer   . Osteoporosis   . Idiopathic pulmonary fibrosis   . Bronchiectasis   . Vitamin D deficiency   . Hyperlipidemia   . Allergic rhinitis   . GERD (gastroesophageal reflux disease)   . Left knee DJD   . Anxiety   . Migraine   . Diverticulosis of colon   . Seizure disorder   . Temporal arteritis     Right eye blind, on steroids per [facility]/ Dr [name]  . CVA (cerebral infarction) 10/2007    Right thalamic   . PMR (polymyalgia rheumatica)   . Anemia   . Gait disorder   . Peripheral neuropathy   . HTN (hypertension)   . Complication of anesthesia     slow to wake up  . Type II or unspecified type diabetes mellitus without mention of complication, not stated as uncontrolled     2nd to steriods  . Renal insufficiency    Past Surgical History  Procedure Date  . Mastectomy   . Cholecystectomy   . Total knee arthroplasty     Left  . Tonsillectomy   . Cataract extraction     OS - Summer 2010  . Lumbar fusion 04/2010    W/Mechanical fixation - [facility]  . Spinal fusion 07/2010    T10-L2 interbody fusion / [facility]  . Colonoscopy 12/15/2011    Procedure: COLONOSCOPY;  Surgeon: [name], MD;  Location: [facility];  Service: Endoscopy;  Laterality: N/A;   Family History  Problem  Relation Age of Onset  . Brain cancer Mother   . Hypertension Mother   . Dementia Father   . Hypertension Father   . Breast cancer Sister    History   Social History  . Marital Status: Widowed    Spouse Name: N/A    Number of Children: 1  . Years of Education: N/A   Occupational History  . Land    Social History Main Topics  . Smoking status: Never Smoker   . Smokeless tobacco: Never Used  . Alcohol Use: No     Comment: heavy drinker until 1995 - sobriety with AA  . Drug Use: No  . Sexually Active: Not Currently   Other Topics Concern  . Not on file   Social History Narrative      Married - husband invalid with stroke - widowed in 2009. 1 chil. 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 ACP - not formerly discussed - wishes to be a full code.     \ Current Outpatient Prescriptions on File Prior to Visit  Medication Sig Dispense Refill  . amLODipine (NORVASC) 10 MG tablet Take 1 tablet (10 mg total) by mouth daily.  30 tablet  0  . Calcium-Magnesium-Zinc 1000-500-50 MG TABS Take by mouth daily.        . cholecalciferol (VITAMIN D) 1000 UNITS tablet Take 1,000 Units by mouth daily.        Marland Kitchen denosumab (PROLIA) 60 MG/ML SOLN Inject 60 mg into the skin every 6 (six) months.        . ferrous sulfate 325 (65 FE) MG tablet Take 325 mg by mouth daily.       . furosemide (LASIX) 40 MG tablet Take 1 tablet (40 mg total) by mouth daily.  90 tablet  3  . methocarbamol (ROBAXIN) 750 MG tablet Take 750 mg by mouth 2 (two) times daily.      . Multiple Vitamins-Minerals (MULTIVITAMIN,TX-MINERALS) tablet Take 1 tablet by mouth daily.        . predniSONE (DELTASONE) 10 MG tablet TAKE 1 TABLET (10MG  TOTAL ) BY MOUTH DAILY  90 tablet  3  . simvastatin (ZOCOR) 5 MG tablet Take 1 tablet (5 mg total) by mouth at bedtime.  90 tablet  3  . simvastatin (ZOCOR) 5 MG tablet TAKE 1 TABLET BY MOUTH AT BEDTIME  90 tablet  3  .  [DISCONTINUED] gabapentin (NEURONTIN) 300 MG capsule TAKE 1 CAPSULE BY MOUTH TWICE DAILY  180 capsule  1  . [DISCONTINUED] simvastatin (ZOCOR) 5 MG tablet Take 1 tablet (5 mg total) by mouth at bedtime.  30 tablet  0  . amLODipine (NORVASC) 10 MG tablet Take 1 tablet (10 mg total) by mouth daily.  90 tablet  3  . famotidine (PEPCID) 20 MG tablet Take 20 mg by mouth daily.       . [DISCONTINUED] amLODipine (NORVASC) 10 MG tablet TAKE 1 TABLET (10MG  TOTAL ) BY MOUTH DAILY  90 tablet  3   \   Review of Systems System review is negative for any constitutional, cardiac, pulmonary, GI or neuro symptoms or complaints other than as described in the HPI.     Objective:   Physical Exam Filed Vitals:   08/04/12 1127  BP: 112/70  Pulse: 97  Temp: 98.6 F (37 C)  Resp: 16   Cor- RRR Pulm - normal respirations.  Back exam: normal stand with effort;  flex to 30 degrees limited by pain; abnormal gait- with poor balance; abnormal toe/heel walk-limited by pain; normal SLR sitting;  normal sensation to light touch; no  CVA tenderness.        Assessment & Plan:

## 2012-08-04 NOTE — Assessment & Plan Note (Signed)
Recurrent back pain bilaterally with limitation in range of motion and decreased strength - cannot toe or heel walk due to weakness and pain.  Plan Increase gabapentin to 300 mg qid  Refer back to Dr. Harl Bowie - for diagnostic evaluation -  in light of her previous surgeries he will best be able to interpret any imaging studies

## 2012-08-04 NOTE — Telephone Encounter (Signed)
Patient calling with lower back pain.  Has had same pain for 2 weeks.  Asking for an appt. this afternoon.  She is currently out of town.  She is having to use her walker.  Has numbness in her feet.  Known to have neuropathy but this is worse.  Denies any known injury.    Scheduled for 1130a, allowing her time to get back into town.  Triaged per Back Symptoms.

## 2012-08-04 NOTE — Assessment & Plan Note (Signed)
BP Readings from Last 3 Encounters:  08/04/12 112/70  04/24/12 154/88  02/28/12 108/60   Adequate control

## 2012-08-09 DIAGNOSIS — M7989 Other specified soft tissue disorders: Secondary | ICD-10-CM | POA: Diagnosis not present

## 2012-08-09 DIAGNOSIS — H53149 Visual discomfort, unspecified: Secondary | ICD-10-CM | POA: Diagnosis not present

## 2012-08-09 DIAGNOSIS — M542 Cervicalgia: Secondary | ICD-10-CM | POA: Diagnosis not present

## 2012-08-09 DIAGNOSIS — H539 Unspecified visual disturbance: Secondary | ICD-10-CM | POA: Diagnosis not present

## 2012-08-09 DIAGNOSIS — M545 Low back pain, unspecified: Secondary | ICD-10-CM | POA: Diagnosis not present

## 2012-08-09 DIAGNOSIS — M5137 Other intervertebral disc degeneration, lumbosacral region: Secondary | ICD-10-CM | POA: Diagnosis not present

## 2012-08-09 DIAGNOSIS — M5430 Sciatica, unspecified side: Secondary | ICD-10-CM | POA: Diagnosis not present

## 2012-08-09 DIAGNOSIS — M549 Dorsalgia, unspecified: Secondary | ICD-10-CM | POA: Diagnosis not present

## 2012-08-09 DIAGNOSIS — R52 Pain, unspecified: Secondary | ICD-10-CM | POA: Diagnosis not present

## 2012-08-09 DIAGNOSIS — Z981 Arthrodesis status: Secondary | ICD-10-CM | POA: Diagnosis not present

## 2012-08-12 DIAGNOSIS — M5137 Other intervertebral disc degeneration, lumbosacral region: Secondary | ICD-10-CM | POA: Diagnosis not present

## 2012-08-12 DIAGNOSIS — M545 Low back pain, unspecified: Secondary | ICD-10-CM | POA: Diagnosis not present

## 2012-08-12 DIAGNOSIS — Z9889 Other specified postprocedural states: Secondary | ICD-10-CM | POA: Diagnosis not present

## 2012-08-12 DIAGNOSIS — Z981 Arthrodesis status: Secondary | ICD-10-CM | POA: Diagnosis not present

## 2012-08-12 DIAGNOSIS — Z9089 Acquired absence of other organs: Secondary | ICD-10-CM | POA: Diagnosis not present

## 2012-08-12 DIAGNOSIS — M899 Disorder of bone, unspecified: Secondary | ICD-10-CM | POA: Diagnosis not present

## 2012-08-13 ENCOUNTER — Telehealth: Payer: Self-pay | Admitting: Internal Medicine

## 2012-08-13 NOTE — Telephone Encounter (Signed)
Caller: Azlynn/Patient; Phone: [phone]; Reason for Call: Pt had an appointment with Dr.  Linda Hedges on 08/04/12 for back issues.  She wants to know if he will write a letter for [employer] so they will refund her some of the money for her trip out of town that she is now going to need to cancel.  She went to ED over the weekend and saw her Neurosurgeon this week.  She is waiting on test results concerning her back.  This note will go to [employer], To whom it may concern at fax number [phone].  She can call her Neurosurgeon to ask him to write it if needed but thought she would start with Dr.  Linda Hedges.  Please call.

## 2012-08-15 NOTE — Telephone Encounter (Signed)
Letter done

## 2012-08-21 ENCOUNTER — Emergency Department (HOSPITAL_COMMUNITY): Payer: Medicare Other

## 2012-08-21 ENCOUNTER — Encounter (HOSPITAL_COMMUNITY): Payer: Self-pay

## 2012-08-21 ENCOUNTER — Emergency Department (HOSPITAL_COMMUNITY)
Admission: EM | Admit: 2012-08-21 | Discharge: 2012-08-21 | Disposition: A | Discharge status: Home / Self Care | Payer: Medicare Other | Attending: Emergency Medicine | Admitting: Emergency Medicine

## 2012-08-21 DIAGNOSIS — Z87448 Personal history of other diseases of urinary system: Secondary | ICD-10-CM | POA: Insufficient documentation

## 2012-08-21 DIAGNOSIS — Z8669 Personal history of other diseases of the nervous system and sense organs: Secondary | ICD-10-CM | POA: Insufficient documentation

## 2012-08-21 DIAGNOSIS — Z79899 Other long term (current) drug therapy: Secondary | ICD-10-CM | POA: Diagnosis not present

## 2012-08-21 DIAGNOSIS — Z8673 Personal history of transient ischemic attack (TIA), and cerebral infarction without residual deficits: Secondary | ICD-10-CM | POA: Insufficient documentation

## 2012-08-21 DIAGNOSIS — R040 Epistaxis: Secondary | ICD-10-CM

## 2012-08-21 DIAGNOSIS — R05 Cough: Secondary | ICD-10-CM | POA: Insufficient documentation

## 2012-08-21 DIAGNOSIS — M549 Dorsalgia, unspecified: Secondary | ICD-10-CM | POA: Diagnosis not present

## 2012-08-21 DIAGNOSIS — I10 Essential (primary) hypertension: Secondary | ICD-10-CM | POA: Diagnosis not present

## 2012-08-21 DIAGNOSIS — Z8719 Personal history of other diseases of the digestive system: Secondary | ICD-10-CM | POA: Insufficient documentation

## 2012-08-21 DIAGNOSIS — E119 Type 2 diabetes mellitus without complications: Secondary | ICD-10-CM | POA: Insufficient documentation

## 2012-08-21 DIAGNOSIS — R059 Cough, unspecified: Secondary | ICD-10-CM | POA: Diagnosis not present

## 2012-08-21 DIAGNOSIS — E559 Vitamin D deficiency, unspecified: Secondary | ICD-10-CM | POA: Diagnosis not present

## 2012-08-21 DIAGNOSIS — I498 Other specified cardiac arrhythmias: Secondary | ICD-10-CM | POA: Diagnosis not present

## 2012-08-21 DIAGNOSIS — E785 Hyperlipidemia, unspecified: Secondary | ICD-10-CM | POA: Diagnosis not present

## 2012-08-21 DIAGNOSIS — G8929 Other chronic pain: Secondary | ICD-10-CM

## 2012-08-21 DIAGNOSIS — Z8659 Personal history of other mental and behavioral disorders: Secondary | ICD-10-CM | POA: Diagnosis not present

## 2012-08-21 DIAGNOSIS — G40909 Epilepsy, unspecified, not intractable, without status epilepticus: Secondary | ICD-10-CM | POA: Insufficient documentation

## 2012-08-21 DIAGNOSIS — Z8709 Personal history of other diseases of the respiratory system: Secondary | ICD-10-CM | POA: Diagnosis not present

## 2012-08-21 DIAGNOSIS — M810 Age-related osteoporosis without current pathological fracture: Secondary | ICD-10-CM | POA: Diagnosis not present

## 2012-08-21 DIAGNOSIS — R918 Other nonspecific abnormal finding of lung field: Secondary | ICD-10-CM | POA: Diagnosis not present

## 2012-08-21 LAB — BASIC METABOLIC PANEL
BUN: 58 mg/dL — ABNORMAL HIGH (ref 6–23)
Calcium: 8.3 mg/dL — ABNORMAL LOW (ref 8.4–10.5)
GFR calc non Af Amer: 36 mL/min — ABNORMAL LOW (ref >90)
Glucose, Bld: 127 mg/dL — ABNORMAL HIGH (ref 70–99)

## 2012-08-21 LAB — PROTIME-INR
INR: 1.05 (ref 0.00–1.49)
Prothrombin Time: 13.6 seconds (ref 11.6–15.2)

## 2012-08-21 LAB — CBC WITH DIFFERENTIAL/PLATELET
Basophils Relative: 0 % (ref 0–1)
Eosinophils Absolute: 0.5 10*3/uL (ref 0.0–0.7)
MCH: 32.3 pg (ref 26.0–34.0)
MCHC: 32.8 g/dL (ref 30.0–36.0)
Neutrophils Relative %: 72 % (ref 43–77)
Platelets: 317 10*3/uL (ref 150–400)
RBC: 3.5 MIL/uL — ABNORMAL LOW (ref 3.87–5.11)

## 2012-08-21 MED ORDER — OXYMETAZOLINE HCL 0.05 % NA SOLN
1.0000 | Freq: Once | NASAL | Status: AC
  Administered 2012-08-21: 1 via NASAL
  Filled 2012-08-21: qty 15

## 2012-08-21 NOTE — ED Provider Notes (Signed)
History     CSN: [id number]  Arrival date & time 08/21/12  0901   First MD Initiated Contact with Patient 08/21/12 [phone]      Chief Complaint  Patient presents with  . Epistaxis    (Consider location/radiation/quality/duration/timing/severity/associated sxs/prior treatment) HPI [name] is a 70 y.o. female who presents to the emergency department with epistaxis that started at 4:00 this morning from the right nostril. She's also noted bleeding from the left nostril. She's had intermittent nosebleeds over the years but they typically respond to direct pressure-these have not. She is not taking any blood thinners, she takes a baby aspirin twice weekly. She has tried since for clock this morning using pressure but nothing has stopped the bleeding. She also complains about a cough that started last night but she thought it was due to swallowing blood. She's also complaining about recent lower extremity edema that has worsened. She has had lower extremity edema in the past but this is resolved on going to sleep and waking up, over the last few days her lower extremities have stayed swollen.  Patient does not lay flat in bed, she has back problems and has had 2 back surgeries so she sleeps upright or in a chair. She denies any chest pain, productive cough, fevers or chills, nausea or vomiting, abdominal pain, weakness, dizziness, numbness, tingling, headache, rash or new unexplained muscle aches or joint aches.   Past Medical History  Diagnosis Date  . History of breast cancer   . Osteoporosis   . Idiopathic pulmonary fibrosis   . Bronchiectasis   . Vitamin D deficiency   . Hyperlipidemia   . Allergic rhinitis   . GERD (gastroesophageal reflux disease)   . Left knee DJD   . Anxiety   . Migraine   . Diverticulosis of colon   . Seizure disorder   . Temporal arteritis     Right eye blind, on steroids per [facility]/ Dr [name]  . CVA (cerebral infarction) 10/2007    Right thalamic   .  PMR (polymyalgia rheumatica)   . Anemia   . Gait disorder   . Peripheral neuropathy   . HTN (hypertension)   . Complication of anesthesia     slow to wake up  . Type II or unspecified type diabetes mellitus without mention of complication, not stated as uncontrolled     2nd to steriods  . Renal insufficiency     Past Surgical History  Procedure Date  . Mastectomy   . Cholecystectomy   . Total knee arthroplasty     Left  . Tonsillectomy   . Cataract extraction     OS - Summer 2010  . Lumbar fusion 04/2010    W/Mechanical fixation - [facility]  . Spinal fusion 07/2010    T10-L2 interbody fusion / [facility]  . Colonoscopy 12/15/2011    Procedure: COLONOSCOPY;  Surgeon: [name], MD;  Location: [facility];  Service: Endoscopy;  Laterality: N/A;    Family History  Problem Relation Age of Onset  . Brain cancer Mother   . Hypertension Mother   . Dementia Father   . Hypertension Father   . Breast cancer Sister     History  Substance Use Topics  . Smoking status: Never Smoker   . Smokeless tobacco: Never Used  . Alcohol Use: No     Comment: heavy drinker until 1995 - sobriety with AA    OB History    Grav Para Term Preterm  Abortions TAB SAB Ect Mult Living                  Review of Systems At least 10pt or greater review of systems completed and are negative except where specified in the HPI.  Allergies  Review of patient's allergies indicates no known allergies.  Home Medications   Current Outpatient Rx  Name  Route  Sig  Dispense  Refill  . AMLODIPINE BESYLATE 10 MG PO TABS   Oral   Take 1 tablet (10 mg total) by mouth daily.   90 tablet   3   . AMLODIPINE BESYLATE 10 MG PO TABS   Oral   Take 1 tablet (10 mg total) by mouth daily.   30 tablet   0   . CALCIUM-MAGNESIUM-ZINC 1000-500-50 MG PO TABS   Oral   Take by mouth daily.           Marland Kitchen VITAMIN D 1000 UNITS PO TABS   Oral   Take 1,000 Units by mouth daily.             . DENOSUMAB 60 MG/ML  SOLN   Subcutaneous   Inject 60 mg into the skin every 6 (six) months.           Marland Kitchen FAMOTIDINE 20 MG PO TABS   Oral   Take 20 mg by mouth daily.          Marland Kitchen FERROUS SULFATE 325 (65 FE) MG PO TABS   Oral   Take 325 mg by mouth daily.          . FUROSEMIDE 40 MG PO TABS   Oral   Take 1 tablet (40 mg total) by mouth daily.   90 tablet   3   . GABAPENTIN 300 MG PO CAPS   Oral   Take 1 capsule (300 mg total) by mouth 4 (four) times daily.   360 capsule   2   . METHOCARBAMOL 750 MG PO TABS   Oral   Take 750 mg by mouth 2 (two) times daily.         . SUPER HIGH VITAMINS/MINERALS PO TABS   Oral   Take 1 tablet by mouth daily.           . OXYCODONE HCL ER 10 MG PO TB12      Take 10 mg by mouth 2 (two) times daily. Fill on or After12/08/2012   60 tablet   0   . OXYCODONE HCL ER 10 MG PO TB12      Take 10 mg by mouth 2 (two) times daily. Fill on or After01/08/2012   60 tablet   0   . OXYCODONE HCL ER 10 MG PO TB12      Take 10 mg by mouth 2 (two) times daily. Fill on or After02/08/2012   60 tablet   0   . OXYCODONE HCL 10 MG PO TABS   Oral   Take 1 tablet (10 mg total) by mouth every 4 (four) hours as needed. Breakthrough pain.   180 tablet   0     Fill on or after11/08/2012   . PREDNISONE 10 MG PO TABS      TAKE 1 TABLET (10MG  TOTAL ) BY MOUTH DAILY   90 tablet   3   . PREDNISONE 5 MG PO TABS   Oral   Take 1 tablet (5 mg total) by mouth daily.   90 tablet   3   .  SIMVASTATIN 5 MG PO TABS   Oral   Take 1 tablet (5 mg total) by mouth at bedtime.   90 tablet   3   . SIMVASTATIN 5 MG PO TABS      TAKE 1 TABLET BY MOUTH AT BEDTIME   90 tablet   3     BP 171/83  Pulse 110  Temp 98.8 F (37.1 C) (Oral)  Resp 18  SpO2 97%  Physical Exam  Nursing notes reviewed.  Electronic medical record reviewed. VITAL SIGNS:   Filed Vitals:   08/21/12 0903  BP: 171/83  Pulse: 110  Temp: 98.8 F (37.1 C)  TempSrc:  Oral  Resp: 18  SpO2: 97%   CONSTITUTIONAL: Awake, oriented, appears non-toxic HENT: Atraumatic, normocephalic, oral mucosa pink and moist, airway patent. Nares patent. Blood and clot in the anterior right sided nasal septum actively bleeding, irritated septal mucosa on the left anterior nasal septum. External ears normal. EYES: Conjunctiva clear, EOMI, PERRLA NECK: Trachea midline, non-tender, supple CARDIOVASCULAR: Normal heart rate, Normal rhythm, No murmurs, rubs, gallops PULMONARY/CHEST:  Rales to mid field. Bilateral lung sounds ABDOMINAL: Non-distended, obese, soft, non-tender - no rebound or guarding.  BS normal. NEUROLOGIC: Non-focal, moving all four extremities, no gross sensory or motor deficits. EXTREMITIES: No clubbing, cyanosis. 3+ lower extremity pitting edema, mild changes on the lower extremities consistent with stasis dermatitis SKIN: Warm, Dry, No erythema, No rash  ED Course  Procedures (including critical care time)  Date: 08/21/2012  Rate: 109  Rhythm: Sinus tachycardia  QRS Axis: normal  Intervals: normal  ST/T Wave abnormalities: normal  Conduction Disutrbances: none  Narrative Interpretation: Q waves in lead 3, aVF; age-indeterminate inferior infarct, biatrial enlargement, possible left ventricular hypertrophy    Labs Reviewed  CBC WITH DIFFERENTIAL  PROTIME-INR  PRO B NATRIURETIC PEPTIDE  TROPONIN I  BASIC METABOLIC PANEL   No results found.   No diagnosis found.    MDM  NEHEMIAH ZADRA is a 71 y.o. female presenting with clear bleeding from Kiesselbach plexus on the right nostril is some irritation of the left naris as well. More concerning is the patient's worsening lower extremity edema and rales. Patient is not ambulatory much and says she's not gotten short of breath recently but she hasn't paid attention to it. Patient's pulse rate is elevated at 110 she does have a history of a pathic pulmonary fibrosis as well, will obtain chest x-ray,  basic labs and cardiac markers including BNP for her rales and lower extremity edema.  We'll use oxymetazoline to and direct pressure to stop bleeding.   Patient's nose is hemostatic. Labs are within normal limits. Lung sounds are likely from idiopathic pulmonary fibrosis.  Patient feels well and will followup as needed with her primary care physician.  I explained the diagnosis and have given explicit precautions to return to the ER including recurrent bleeding or any other new or worsening symptoms. The patient understands and accepts the medical plan as it's been dictated and I have answered their questions. Discharge instructions concerning home care and prescriptions have been given.  The patient is STABLE and is discharged to home in good condition.     Rhunette Croft, MD 08/21/12 1407

## 2012-08-21 NOTE — ED Notes (Addendum)
Patient reports that she began having a nosebleed at 0400 and that it stops for a few minutes then it begins again. Patient states she takes Aspirin twice a week. Patient reports that she has had nosebleeds in the past, but not this bad. Patient also has swelling in both feet Left>Right. Patient reports  2 weeks on swelling of both feet and ankles.

## 2012-08-21 NOTE — ED Notes (Signed)
RN request blood draw with IV start

## 2012-08-27 ENCOUNTER — Telehealth: Payer: Self-pay | Admitting: Internal Medicine

## 2012-08-27 NOTE — Telephone Encounter (Signed)
Patient Information:  Caller Name: Dorrene  Phone: [phone]  Patient: [name], [name]  Gender: Female  DOB: 10/07/1940  Age: 71 Years  PCP: Adella Hare (Adults only)   Symptoms  Reason For Call & Symptoms: her feet are swollen twice their normal size, seen in ED 11/30 for a nose bleed and the MD told her to increase the Lasix to 60mg   qd  Reviewed Health History In EMR: Yes  Reviewed Medications In EMR: Yes  Reviewed Allergies In EMR: Yes  Reviewed Surgeries / Procedures: Yes  Date of Onset of Symptoms: 08/09/2012  Treatments Tried: increased Lasix to 60mg  po daily  Treatments Tried Worked: No  Guideline(s) Used:  Leg Swelling and Edema  Disposition Per Guideline:   See Today in Office  Reason For Disposition Reached:   Moderate swelling of both ankles (e.g., swelling extends up to the knees) AND new onset or worsening  Advice Given:  N/A  Office Follow Up:  Does the office need to follow up with this patient?: Yes  Instructions For The Office: does she need to increase the Lasix more then 60mg  po daily?  Patient Refused Recommendation:  Patient Refused Appt, Patient Requests Appt At Later Date  cannot come in today, no transportation  RN Note:  Unable to come to the office today or tomorrow, has to arrange transportation with [facility] family/friends. Please call to schedule an appointment.

## 2012-08-28 ENCOUNTER — Ambulatory Visit (INDEPENDENT_AMBULATORY_CARE_PROVIDER_SITE_OTHER): Payer: Medicare Other | Admitting: Internal Medicine

## 2012-08-28 ENCOUNTER — Encounter: Payer: Self-pay | Admitting: Internal Medicine

## 2012-08-28 VITALS — BP 120/70 | HR 106 | Temp 99.4°F | Wt 213.1 lb

## 2012-08-28 DIAGNOSIS — IMO0002 Reserved for concepts with insufficient information to code with codable children: Secondary | ICD-10-CM

## 2012-08-28 DIAGNOSIS — R609 Edema, unspecified: Secondary | ICD-10-CM | POA: Diagnosis not present

## 2012-08-28 DIAGNOSIS — R600 Localized edema: Secondary | ICD-10-CM

## 2012-08-28 MED ORDER — OXYCODONE HCL 10 MG PO TABS: 10.0000 mg | ORAL_TABLET | ORAL | Status: DC | PRN

## 2012-08-28 MED ORDER — TORSEMIDE 20 MG PO TABS: 20.0000 mg | ORAL_TABLET | Freq: Every day | ORAL | Status: DC

## 2012-08-28 NOTE — Patient Instructions (Addendum)
It was good to see you today. We have reviewed your prior records including labs and tests today Stop lasix (furosemide) and start Demadex (toresemide) for swelling - Your prescription(s) have been submitted to your pharmacy. Please take as directed and contact our office if you believe you are having problem(s) with the medication(s). Refill on short acting pain pills as requested - paper prescription given to you we'll make referral for ultrasound of heart (Echocardiogram) to evaluate heat as possible cause of swelling. Our office will contact you regarding appointment(s) once made. Return Wed 12/11 for lab only to monitor kidney function Please schedule followup in 3-4 weeks with Dr Linda Hedges for review/recheck, call sooner if problems.

## 2012-08-28 NOTE — Telephone Encounter (Signed)
Pt came to see Dr Asa Lente today.

## 2012-08-28 NOTE — Progress Notes (Signed)
Subjective:    Patient ID: [name], female    DOB: 18-Jul-1941, 71 y.o.   MRN: [id number]  HPI complains of edema - feet and legs  Onset 3 weeks ago Denies prior hx same - no hx CHF known Denies med changes - rx or otc - chronic pred since 2009 due to TA Not associated with shortness of breath or chest pain, no palpitations  Past Medical History  Diagnosis Date  . History of breast cancer   . Osteoporosis   . Idiopathic pulmonary fibrosis   . Bronchiectasis   . Vitamin D deficiency   . Hyperlipidemia   . Allergic rhinitis   . GERD (gastroesophageal reflux disease)   . Left knee DJD   . Anxiety   . Migraine   . Diverticulosis of colon   . Seizure disorder   . Temporal arteritis     Right eye blind, on steroids per [facility]/ Dr [name]  . CVA (cerebral infarction) 10/2007    Right thalamic   . PMR (polymyalgia rheumatica)   . Anemia   . Gait disorder   . Peripheral neuropathy   . HTN (hypertension)   . Type II or unspecified type diabetes mellitus without mention of complication, not stated as uncontrolled     2nd to steriods  . Renal insufficiency     Review of Systems  Constitutional: Positive for unexpected weight change. Negative for fever and fatigue.  Respiratory: Negative for cough, shortness of breath, wheezing and stridor.   Cardiovascular: Positive for leg swelling. Negative for chest pain and palpitations.  Musculoskeletal: Positive for back pain (chronic, unchanged).       Objective:   Physical Exam BP 120/70  Pulse 106  Temp 99.4 F (37.4 C) (Oral)  Wt 213 lb 1.9 oz (96.671 kg)  SpO2 91% Wt Readings from Last 3 Encounters:  08/28/12 213 lb 1.9 oz (96.671 kg)  08/04/12 198 lb (89.812 kg)  04/24/12 200 lb (90.719 kg)   Constitutional: She is overweight, but appears well-developed and well-nourished. No distress.  Neck: Normal range of motion. Neck supple. No JVD present. No thyromegaly present.  Cardiovascular: Normal rate, regular rhythm  and normal heart sounds.  No murmur heard. 2+ pitting BLE edema knees down. Pulmonary/Chest: Effort normal and breath sounds normal. No respiratory distress. She has no wheezes.  Neurological: She is alert and oriented to person, place, and time. No cranial nerve deficit. Coordination normal.  Skin: mild venous insuff changes distal BLE - no cellulitis or rash noted. No erythema.  Psychiatric: She has a normal mood and affect. Her behavior is normal. Judgment and thought content normal.   Lab Results  Component Value Date   WBC 16.1* 08/21/2012   HGB 11.3* 08/21/2012   HCT 34.5* 08/21/2012   PLT 317 08/21/2012   GLUCOSE 127* 08/21/2012   CHOL 184 03/30/2008   TRIG 116 03/30/2008   HDL 73.4 03/30/2008   LDLCALC 87 03/30/2008   ALT 15 12/14/2011   AST 15 12/14/2011   NA 139 08/21/2012   K 4.5 08/21/2012   CL 102 08/21/2012   CREATININE 1.43* 08/21/2012   BUN 58* 08/21/2012   CO2 27 08/21/2012   TSH 1.854 12/14/2011   INR 1.05 08/21/2012   HGBA1C 5.9* 12/14/2011       Assessment & Plan:   Edema - BLE associated with significant weight gain/fluid retention  Reviewed labs last week done in ER - renal fx stable Increased furosemide 40 to 60/day without notable effect  No hx liver dz or CHF - recent ECG reviewed Change to alternate loop diuretic - Demadex in place of furosemide Refer for Echo - check LVEF Recheck labs in 1 week to monitor renal fx, pt to call sooner if problems/ineffective relief of edema

## 2012-08-28 NOTE — Assessment & Plan Note (Signed)
Recurrent back pain bilaterally with limitation in range of motion and decreased strength - cannot toe or heel walk due to weakness and pain. Following with Dr. Harl Bowie - planning repeat surgery Refill sort acting pian meds as requested

## 2012-09-01 ENCOUNTER — Inpatient Hospital Stay (HOSPITAL_COMMUNITY)
Admission: EM | Admit: 2012-09-01 | Discharge: 2012-09-05 | DRG: 603 | Disposition: A | Discharge status: Skilled Nursing Facility | Payer: Medicare Other | Attending: Internal Medicine | Admitting: Internal Medicine

## 2012-09-01 ENCOUNTER — Encounter (HOSPITAL_COMMUNITY): Payer: Self-pay | Admitting: *Deleted

## 2012-09-01 ENCOUNTER — Telehealth: Payer: Self-pay | Admitting: Internal Medicine

## 2012-09-01 DIAGNOSIS — J479 Bronchiectasis, uncomplicated: Secondary | ICD-10-CM | POA: Diagnosis not present

## 2012-09-01 DIAGNOSIS — N182 Chronic kidney disease, stage 2 (mild): Secondary | ICD-10-CM | POA: Diagnosis present

## 2012-09-01 DIAGNOSIS — I10 Essential (primary) hypertension: Secondary | ICD-10-CM | POA: Diagnosis present

## 2012-09-01 DIAGNOSIS — R269 Unspecified abnormalities of gait and mobility: Secondary | ICD-10-CM | POA: Diagnosis present

## 2012-09-01 DIAGNOSIS — I129 Hypertensive chronic kidney disease with stage 1 through stage 4 chronic kidney disease, or unspecified chronic kidney disease: Secondary | ICD-10-CM | POA: Diagnosis present

## 2012-09-01 DIAGNOSIS — L02419 Cutaneous abscess of limb, unspecified: Principal | ICD-10-CM | POA: Diagnosis present

## 2012-09-01 DIAGNOSIS — Z9010 Acquired absence of unspecified breast and nipple: Secondary | ICD-10-CM

## 2012-09-01 DIAGNOSIS — E1142 Type 2 diabetes mellitus with diabetic polyneuropathy: Secondary | ICD-10-CM | POA: Diagnosis present

## 2012-09-01 DIAGNOSIS — N179 Acute kidney failure, unspecified: Secondary | ICD-10-CM | POA: Diagnosis not present

## 2012-09-01 DIAGNOSIS — R6889 Other general symptoms and signs: Secondary | ICD-10-CM | POA: Diagnosis not present

## 2012-09-01 DIAGNOSIS — L0390 Cellulitis, unspecified: Secondary | ICD-10-CM | POA: Diagnosis present

## 2012-09-01 DIAGNOSIS — Z8719 Personal history of other diseases of the digestive system: Secondary | ICD-10-CM

## 2012-09-01 DIAGNOSIS — Z79899 Other long term (current) drug therapy: Secondary | ICD-10-CM | POA: Diagnosis not present

## 2012-09-01 DIAGNOSIS — IMO0002 Reserved for concepts with insufficient information to code with codable children: Secondary | ICD-10-CM

## 2012-09-01 DIAGNOSIS — N189 Chronic kidney disease, unspecified: Secondary | ICD-10-CM

## 2012-09-01 DIAGNOSIS — Z8673 Personal history of transient ischemic attack (TIA), and cerebral infarction without residual deficits: Secondary | ICD-10-CM | POA: Diagnosis not present

## 2012-09-01 DIAGNOSIS — I499 Cardiac arrhythmia, unspecified: Secondary | ICD-10-CM | POA: Diagnosis not present

## 2012-09-01 DIAGNOSIS — E1149 Type 2 diabetes mellitus with other diabetic neurological complication: Secondary | ICD-10-CM | POA: Diagnosis present

## 2012-09-01 DIAGNOSIS — E785 Hyperlipidemia, unspecified: Secondary | ICD-10-CM | POA: Diagnosis present

## 2012-09-01 DIAGNOSIS — D638 Anemia in other chronic diseases classified elsewhere: Secondary | ICD-10-CM | POA: Diagnosis not present

## 2012-09-01 DIAGNOSIS — L03119 Cellulitis of unspecified part of limb: Secondary | ICD-10-CM | POA: Diagnosis not present

## 2012-09-01 DIAGNOSIS — G608 Other hereditary and idiopathic neuropathies: Secondary | ICD-10-CM | POA: Diagnosis not present

## 2012-09-01 DIAGNOSIS — M5137 Other intervertebral disc degeneration, lumbosacral region: Secondary | ICD-10-CM | POA: Diagnosis present

## 2012-09-01 DIAGNOSIS — R609 Edema, unspecified: Secondary | ICD-10-CM | POA: Diagnosis not present

## 2012-09-01 DIAGNOSIS — J8410 Pulmonary fibrosis, unspecified: Secondary | ICD-10-CM | POA: Diagnosis present

## 2012-09-01 DIAGNOSIS — E876 Hypokalemia: Secondary | ICD-10-CM | POA: Diagnosis not present

## 2012-09-01 DIAGNOSIS — M51379 Other intervertebral disc degeneration, lumbosacral region without mention of lumbar back pain or lower extremity pain: Secondary | ICD-10-CM | POA: Diagnosis present

## 2012-09-01 DIAGNOSIS — R279 Unspecified lack of coordination: Secondary | ICD-10-CM | POA: Diagnosis not present

## 2012-09-01 DIAGNOSIS — E119 Type 2 diabetes mellitus without complications: Secondary | ICD-10-CM | POA: Diagnosis present

## 2012-09-01 DIAGNOSIS — R52 Pain, unspecified: Secondary | ICD-10-CM | POA: Diagnosis not present

## 2012-09-01 DIAGNOSIS — M316 Other giant cell arteritis: Secondary | ICD-10-CM | POA: Diagnosis present

## 2012-09-01 DIAGNOSIS — R262 Difficulty in walking, not elsewhere classified: Secondary | ICD-10-CM | POA: Diagnosis not present

## 2012-09-01 DIAGNOSIS — M810 Age-related osteoporosis without current pathological fracture: Secondary | ICD-10-CM | POA: Diagnosis not present

## 2012-09-01 DIAGNOSIS — M6281 Muscle weakness (generalized): Secondary | ICD-10-CM | POA: Diagnosis not present

## 2012-09-01 DIAGNOSIS — L0291 Cutaneous abscess, unspecified: Secondary | ICD-10-CM | POA: Diagnosis not present

## 2012-09-01 DIAGNOSIS — N259 Disorder resulting from impaired renal tubular function, unspecified: Secondary | ICD-10-CM

## 2012-09-01 DIAGNOSIS — I120 Hypertensive chronic kidney disease with stage 5 chronic kidney disease or end stage renal disease: Secondary | ICD-10-CM | POA: Diagnosis not present

## 2012-09-01 LAB — CBC
Hemoglobin: 10.4 g/dL — ABNORMAL LOW (ref 12.0–15.0)
MCH: 31.8 pg (ref 26.0–34.0)
MCHC: 31.8 g/dL (ref 30.0–36.0)
Platelets: 284 10*3/uL (ref 150–400)

## 2012-09-01 LAB — BASIC METABOLIC PANEL
Calcium: 8.8 mg/dL (ref 8.4–10.5)
Creatinine, Ser: 1.5 mg/dL — ABNORMAL HIGH (ref 0.50–1.10)
GFR calc non Af Amer: 34 mL/min — ABNORMAL LOW (ref >90)
Glucose, Bld: 129 mg/dL — ABNORMAL HIGH (ref 70–99)
Sodium: 139 mEq/L (ref 135–145)

## 2012-09-01 MED ORDER — AMLODIPINE BESYLATE 10 MG PO TABS
10.0000 mg | ORAL_TABLET | Freq: Every day | ORAL | Status: DC
  Administered 2012-09-02 – 2012-09-05 (×4): 10 mg via ORAL
  Filled 2012-09-01 (×4): qty 1

## 2012-09-01 MED ORDER — OXYCODONE HCL 5 MG PO TABS
10.0000 mg | ORAL_TABLET | ORAL | Status: DC | PRN
  Administered 2012-09-02 – 2012-09-05 (×17): 10 mg via ORAL
  Filled 2012-09-01 (×17): qty 2

## 2012-09-01 MED ORDER — PREDNISONE 5 MG PO TABS
15.0000 mg | ORAL_TABLET | Freq: Every day | ORAL | Status: DC
  Administered 2012-09-02 – 2012-09-05 (×4): 15 mg via ORAL
  Filled 2012-09-01 (×4): qty 1

## 2012-09-01 MED ORDER — SIMVASTATIN 5 MG PO TABS
5.0000 mg | ORAL_TABLET | Freq: Every day | ORAL | Status: DC
  Administered 2012-09-01 – 2012-09-04 (×4): 5 mg via ORAL
  Filled 2012-09-01 (×5): qty 1

## 2012-09-01 MED ORDER — ONDANSETRON HCL 4 MG PO TABS: 4.0000 mg | ORAL_TABLET | Freq: Four times a day (QID) | ORAL | Status: DC | PRN

## 2012-09-01 MED ORDER — ASPIRIN EC 81 MG PO TBEC
81.0000 mg | DELAYED_RELEASE_TABLET | Freq: Every day | ORAL | Status: DC
  Administered 2012-09-02 – 2012-09-03 (×2): 81 mg via ORAL
  Filled 2012-09-01 (×4): qty 1

## 2012-09-01 MED ORDER — GABAPENTIN 300 MG PO CAPS
300.0000 mg | ORAL_CAPSULE | Freq: Four times a day (QID) | ORAL | Status: DC
  Administered 2012-09-01 – 2012-09-05 (×14): 300 mg via ORAL
  Filled 2012-09-01 (×17): qty 1

## 2012-09-01 MED ORDER — DENOSUMAB 60 MG/ML ~~LOC~~ SOLN: 60.0000 mg | SUBCUTANEOUS | Status: DC

## 2012-09-01 MED ORDER — OXYCODONE HCL 5 MG PO TABS
10.0000 mg | ORAL_TABLET | Freq: Once | ORAL | Status: AC
  Administered 2012-09-01: 10 mg via ORAL
  Filled 2012-09-01: qty 2

## 2012-09-01 MED ORDER — FERROUS SULFATE 325 (65 FE) MG PO TABS
325.0000 mg | ORAL_TABLET | Freq: Two times a day (BID) | ORAL | Status: DC
  Administered 2012-09-01 – 2012-09-05 (×8): 325 mg via ORAL
  Filled 2012-09-01 (×9): qty 1

## 2012-09-01 MED ORDER — METHOCARBAMOL 750 MG PO TABS
750.0000 mg | ORAL_TABLET | Freq: Two times a day (BID) | ORAL | Status: DC
  Administered 2012-09-01 – 2012-09-05 (×8): 750 mg via ORAL
  Filled 2012-09-01 (×9): qty 1

## 2012-09-01 MED ORDER — ONDANSETRON HCL 4 MG/2ML IJ SOLN: 4.0000 mg | Freq: Four times a day (QID) | INTRAMUSCULAR | Status: DC | PRN

## 2012-09-01 MED ORDER — OXYCODONE HCL ER 10 MG PO T12A
10.0000 mg | EXTENDED_RELEASE_TABLET | Freq: Two times a day (BID) | ORAL | Status: DC
  Administered 2012-09-01 – 2012-09-05 (×8): 10 mg via ORAL
  Filled 2012-09-01 (×8): qty 1

## 2012-09-01 MED ORDER — FUROSEMIDE 10 MG/ML IJ SOLN
60.0000 mg | Freq: Once | INTRAMUSCULAR | Status: AC
  Administered 2012-09-01: 60 mg via INTRAVENOUS
  Filled 2012-09-01: qty 8

## 2012-09-01 MED ORDER — VANCOMYCIN HCL IN DEXTROSE 1-5 GM/200ML-% IV SOLN
1000.0000 mg | INTRAVENOUS | Status: DC
  Administered 2012-09-02: 1000 mg via INTRAVENOUS
  Filled 2012-09-01 (×2): qty 200

## 2012-09-01 MED ORDER — ENOXAPARIN SODIUM 40 MG/0.4ML ~~LOC~~ SOLN
40.0000 mg | SUBCUTANEOUS | Status: DC
  Administered 2012-09-01 – 2012-09-04 (×4): 40 mg via SUBCUTANEOUS
  Filled 2012-09-01 (×5): qty 0.4

## 2012-09-01 MED ORDER — VANCOMYCIN HCL IN DEXTROSE 1-5 GM/200ML-% IV SOLN
1000.0000 mg | Freq: Once | INTRAVENOUS | Status: AC
  Administered 2012-09-01: 1000 mg via INTRAVENOUS
  Filled 2012-09-01: qty 200

## 2012-09-01 MED ORDER — TORSEMIDE 20 MG PO TABS
20.0000 mg | ORAL_TABLET | Freq: Every day | ORAL | Status: DC
  Administered 2012-09-02: 20 mg via ORAL
  Filled 2012-09-01: qty 1

## 2012-09-01 NOTE — ED Notes (Signed)
Called to give report. Was told Lauren RN unavailable at this time and will call when available.

## 2012-09-01 NOTE — Telephone Encounter (Signed)
Yes - ok to "double" Demadex - try 40mg  daily and follow up with [facility] as previously advised thanks

## 2012-09-01 NOTE — ED Notes (Signed)
Pt is from home; states that she has noticed increased swelling in her lower extremities. PCP switched pt from furosemide to toresemide this past thurs/fri. States that her swelling has started increasing over the weekend since her switch. Pt states that she has also been having trouble getting around and has not been able to getup and ambulate with her walker. Also, complains of back pain which is recurrent.

## 2012-09-01 NOTE — ED Notes (Signed)
[id number] Expected date:<BR> Expected time:<BR> Means of arrival:Ambulance<BR> Comments:<BR> Cellulitis

## 2012-09-01 NOTE — H&P (Signed)
[facility] History and Physical  KIKO FICO [id number] DOB: 01-08-1941 DOA: 09/01/2012  Referring physician: ED physician PCP: Adella Hare, MD   Chief Complaint: Lower extremity swelling  HPI:  Pt is 71 yo female with multiple and complex medical problems including temporal arteritis on chronic prednisone, HLD, HTN, DM2, CKD stage II, who now presents to [facility] ED with main concern of one week progressively worsening bilateral lower extremity swelling, now extending above her knees, associated with erythema and tenderness to touch, generalized malaise and pain especially with ambulation. Pt reports that her dose of Lasix was recently changed from 40 mg to 60 mg dose and that helped with swelling but redness has progressed and pain was not relieved with her usual home analgesic regimen. She denies fevers, chills, any specific focal weakness, no abdominal or urinary concerns.  ED EVENTS; diagnosis of cellulitis in lower extremities made and pt given one dose of Vancomycin   Assessment and Plan:  Principal Problem:  *Cellulitis - will admit the pt to medical floor and will start IV ABX, Vancomycin per pharmacy to dose - provide supportive care with analgesia and keep extremities slightly elevated while in bed Active Problems:  DIABETES MELLITUS, TYPE II - appears to be well controlled, last A1C 11/2011 = 5.9 - will let PCP decide if A1C needs to checked inpatient or can be deferred to an outpatient evaluation as it is not an active issue at this time  HYPERTENSION - reasonably well controlled - will continue home medication regimen  Temporal arteritis - on chronic prednisone and will continue same regimen  Acute on chronic renal failure - creatinine appears to be around pt's baseline - continue Lasix and will let PCP decide on readjusting the dose as indicated - BMP in AM  Anemia due to chronic illness - Hg and Hct reman stable and at pt's baseline  HYPERLIPIDEMIA -  continue statin   Code Status: Full Family Communication: Pt at bedside Disposition Plan: PT evaluation   Review of Systems:  Constitutional: Negative for fever, chills, positive for malaise/fatigue. Negative for diaphoresis.  HENT: Negative for hearing loss, ear pain, nosebleeds, congestion, sore throat, neck pain, tinnitus and ear discharge.   Eyes: Negative for blurred vision, double vision, photophobia, pain, discharge and redness.  Respiratory: Negative for cough, hemoptysis, sputum production, shortness of breath, wheezing and stridor.   Cardiovascular: Negative for chest pain, palpitations, orthopnea, claudication, positive for leg swelling.  Gastrointestinal: Negative for nausea, vomiting and abdominal pain. Negative for heartburn, constipation, blood in stool and melena.  Genitourinary: Negative for dysuria, urgency, frequency, hematuria and flank pain.  Musculoskeletal: Negative for myalgias, back pain, joint pain and falls.  Skin: Negative for itching and rash.  Neurological: Negative for dizziness and weakness. Negative for tingling, tremors, sensory change, speech change, focal weakness, loss of consciousness and headaches.  Endo/Heme/Allergies: Negative for environmental allergies and polydipsia. Does not bruise/bleed easily.  Psychiatric/Behavioral: Negative for suicidal ideas. The patient is not nervous/anxious.      Past Medical History  Diagnosis Date  . History of breast cancer   . Osteoporosis   . Idiopathic pulmonary fibrosis   . Bronchiectasis   . Vitamin D deficiency   . Hyperlipidemia   . Allergic rhinitis   . GERD (gastroesophageal reflux disease)   . Left knee DJD   . Anxiety   . Migraine   . Diverticulosis of colon   . Seizure disorder   . Temporal arteritis     Right eye blind,  on steroids per [facility]/ Dr [name]  . CVA (cerebral infarction) 10/2007    Right thalamic   . PMR (polymyalgia rheumatica)   . Anemia   . Gait disorder   . Peripheral  neuropathy   . HTN (hypertension)   . Type II or unspecified type diabetes mellitus without mention of complication, not stated as uncontrolled     2nd to steriods  . Renal insufficiency     Past Surgical History  Procedure Date  . Mastectomy   . Cholecystectomy   . Total knee arthroplasty     Left  . Tonsillectomy   . Cataract extraction     OS - Summer 2010  . Lumbar fusion 04/2010    W/Mechanical fixation - [facility]  . Spinal fusion 07/2010    T10-L2 interbody fusion / [facility]  . Colonoscopy 12/15/2011    Procedure: COLONOSCOPY;  Surgeon: [name], MD;  Location: [facility];  Service: Endoscopy;  Laterality: N/A;    Social History:  reports that she has never smoked. She has never used smokeless tobacco. She reports that she does not drink alcohol or use illicit drugs.  No Known Allergies  Family History  Problem Relation Age of Onset  . Brain cancer Mother   . Hypertension Mother   . Dementia Father   . Hypertension Father   . Breast cancer Sister     Prior to Admission medications   Medication Sig Start Date End Date Taking? Authorizing Provider  amLODipine (NORVASC) 10 MG tablet Take 1 tablet (10 mg total) by mouth daily. 06/30/12  Yes Neena Rhymes, MD  aspirin 81 MG tablet Take 81 mg by mouth. Twice per week on wed and sunday   Yes Historical Provider, MD  calcium carbonate (OS-CAL) 600 MG TABS Take 600 mg by mouth 2 (two) times daily with a meal.   Yes Historical Provider, MD  cholecalciferol (VITAMIN D) 1000 UNITS tablet Take 1,000 Units by mouth daily.     Yes Historical Provider, MD  ferrous sulfate 325 (65 FE) MG tablet Take 325 mg by mouth 2 (two) times daily.    Yes Historical Provider, MD  gabapentin (NEURONTIN) 300 MG capsule Take 1 capsule (300 mg total) by mouth 4 (four) times daily. 08/04/12  Yes Neena Rhymes, MD  methocarbamol (ROBAXIN) 750 MG tablet Take 750 mg by mouth 2 (two) times daily. 06/05/11  Yes Neena Rhymes, MD  Multiple Vitamins-Minerals (MULTIVITAMIN,TX-MINERALS) tablet Take 1 tablet by mouth daily.     Yes Historical Provider, MD  oxyCODONE (OXYCONTIN) 10 MG 12 hr tablet Take 10 mg by mouth 2 (two) times daily. Fill on or After12/08/2012 08/04/12  Yes Neena Rhymes, MD  Oxycodone HCl 10 MG TABS Take 1 tablet (10 mg total) by mouth every 4 (four) hours as needed. Breakthrough pain. 08/28/12  Yes Rowe Clack, MD  predniSONE (DELTASONE) 5 MG tablet Take 15 mg by mouth daily. Takes 1(5mg ) tablet with 1(10mg ) tablet for total dose 15mg    Yes Historical Provider, MD  simvastatin (ZOCOR) 5 MG tablet Take 1 tablet (5 mg total) by mouth at bedtime. 06/20/11  Yes Neena Rhymes, MD  torsemide (DEMADEX) 20 MG tablet Take 1 tablet (20 mg total) by mouth daily. 08/28/12  Yes Rowe Clack, MD  denosumab (PROLIA) 60 MG/ML SOLN Inject 60 mg into the skin every 6 (six) months.      Historical Provider, MD    Physical Exam: Filed Vitals:  09/01/12 1426 09/01/12 1622  BP: 141/59 130/59  Pulse: 101 99  Temp: 98.7 F (37.1 C) 98.6 F (37 C)  TempSrc: Oral Oral  Resp: 20 18  SpO2: 99% 93%    Physical Exam  Constitutional: Appears well-developed and well-nourished. No distress.  HENT: Normocephalic. External right and left ear normal. Oropharynx is clear and moist.  Eyes: Conjunctivae and EOM are normal. PERRLA, no scleral icterus.  Neck: Normal ROM. Neck supple. No JVD. No tracheal deviation. No thyromegaly.  CVS: Regular rhythm, tachycardic, S1/S2 +, no murmurs, no gallops, no carotid bruit.  Pulmonary: Effort and breath sounds normal, no stridor, rhonchi, wheezes, rales.  Abdominal: Soft. BS +,  no distension, tenderness, rebound or guarding.  Musculoskeletal: Normal range of motion.Bilateral lower extremity edema, erythema, warmth to touch and tenderness to palpation. Lymphadenopathy: No lymphadenopathy noted, cervical, inguinal. Neuro: Alert. Normal reflexes, muscle tone  coordination. No cranial nerve deficit. Skin: Skin is warm and dry. No rash noted. Not diaphoretic. No erythema. No pallor.  Psychiatric: Normal mood and affect. Behavior, judgment, thought content normal.   Labs on Admission:  Basic Metabolic Panel:  Lab [id number] 1522  NA 139  K 4.2  CL 101  CO2 30  GLUCOSE 129*  BUN 25*  CREATININE 1.50*  CALCIUM 8.8  MG --  PHOS --   CBC:  Lab 09/01/12 1522  WBC 10.2  NEUTROABS --  HGB 10.4*  HCT 32.7*  MCV 100.0  PLT 284    Radiological Exams on Admission: No results found.  EKG: Normal sinus rhythm, no ST/T wave changes  Faye Ramsay, MD  [facility] Pager [phone]  If 7PM-7AM, please contact night-coverage [email or web address] Password [email or web address] 09/01/2012, 6:12 PM

## 2012-09-01 NOTE — Telephone Encounter (Signed)
Pt called back stating she was seen in ER and Dx with Cellulitis

## 2012-09-01 NOTE — Telephone Encounter (Signed)
Patient Information:  Caller Name: [name]  Phone: [phone]  Patient: [name], [name]  Gender: Female  DOB: 02-04-1941  Age: 71 Years  PCP: Adella Hare (Adults only)   Symptoms  Reason For Call & Symptoms: swelling in feet and lgs not better  Reviewed Health History In EMR: Yes  Reviewed Medications In EMR: Yes  Reviewed Allergies In EMR: Yes  Reviewed Surgeries / Procedures: Yes  Date of Onset of Symptoms: 08/18/2012  Treatments Tried: [place], elevating feet and legs  Treatments Tried Worked: No  Guideline(s) Used:  Leg Swelling and Edema  Disposition Per Guideline:   See Today in Office  Reason For Disposition Reached:   Moderate swelling of both ankles (e.g., swelling extends up to the knees) AND new onset or worsening  Advice Given:  Call Back If:  Swelling becomes worse  Call Back If:  Swelling becomes worse  You become worse.  Office Follow Up:  Does the office need to follow up with this patient?: Yes  Instructions For The Office: Please call patient, states she was told to call if swelling in legs and feet  was not better so medication could be adjusted.  Patient Refused Recommendation:  Patient Requests Prescription  Pt states she was told to call if symptoms were not better so medication could be adjusted.  RN Note:  Patient was seen in office on Friday. She was told to call if swelling was not improving. Patient states swelling is the same. She reports that she has bilateral redness and pain in her legs that is constant but were both present on Friday. She denies warmth.  She needs to know if doctor wants to increase Torsemide or for her to switch back to Furosemide. Please call.

## 2012-09-01 NOTE — ED Provider Notes (Signed)
History     CSN: [id number]  Arrival date & time 09/01/12  1407   First MD Initiated Contact with Patient 09/01/12 1503      Chief Complaint  Patient presents with  . Cellulitis    (Consider location/radiation/quality/duration/timing/severity/associated sxs/prior treatment) HPI Pt presents with c/o increased swelling in her bilateral lower exremities as well as now developing redness of the skin of the bilateral legs as well.  She states the swelling has increased over the past 1-2 weeks, redness coming on in the last 2-3 days.  No fever/chills.  No sob or chest pain.  Pt takes lasix and had recently increased her dose from 40-60mg .  This was not helping decrease the swelling, so her MD changed her to torsemide 20mg  daily- started 2 days ago.  Legs have pain with movement and palpation.  She is also having difficulty walking.  There are no other associated systemic symptoms, there are no other alleviating or modifying factors.   Past Medical History  Diagnosis Date  . History of breast cancer   . Osteoporosis   . Idiopathic pulmonary fibrosis   . Bronchiectasis   . Vitamin D deficiency   . Hyperlipidemia   . Allergic rhinitis   . GERD (gastroesophageal reflux disease)   . Left knee DJD   . Anxiety   . Migraine   . Diverticulosis of colon   . Seizure disorder   . Temporal arteritis     Right eye blind, on steroids per [facility]/ Dr [name]  . CVA (cerebral infarction) 10/2007    Right thalamic   . PMR (polymyalgia rheumatica)   . Anemia   . Gait disorder   . Peripheral neuropathy   . HTN (hypertension)   . Type II or unspecified type diabetes mellitus without mention of complication, not stated as uncontrolled     2nd to steriods  . Renal insufficiency     Past Surgical History  Procedure Date  . Mastectomy   . Cholecystectomy   . Total knee arthroplasty     Left  . Tonsillectomy   . Cataract extraction     OS - Summer 2010  . Lumbar fusion 04/2010    W/Mechanical  fixation - [facility]  . Spinal fusion 07/2010    T10-L2 interbody fusion / [facility]  . Colonoscopy 12/15/2011    Procedure: COLONOSCOPY;  Surgeon: [name], MD;  Location: [facility];  Service: Endoscopy;  Laterality: N/A;    Family History  Problem Relation Age of Onset  . Brain cancer Mother   . Hypertension Mother   . Dementia Father   . Hypertension Father   . Breast cancer Sister     History  Substance Use Topics  . Smoking status: Never Smoker   . Smokeless tobacco: Never Used  . Alcohol Use: No     Comment: heavy drinker until 1995 - sobriety with AA    OB History    Grav Para Term Preterm Abortions TAB SAB Ect Mult Living                  Review of Systems ROS reviewed and all otherwise negative except for mentioned in HPI  Allergies  Review of patient's allergies indicates no known allergies.  Home Medications   Current Outpatient Rx  Name  Route  Sig  Dispense  Refill  . AMLODIPINE BESYLATE 10 MG PO TABS   Oral   Take 1 tablet (10 mg total) by mouth daily.  30 tablet   0   . ASPIRIN 81 MG PO TABS   Oral   Take 81 mg by mouth. Twice per week on wed and sunday         . CALCIUM CARBONATE 600 MG PO TABS   Oral   Take 600 mg by mouth 2 (two) times daily with a meal.         . VITAMIN D 1000 UNITS PO TABS   Oral   Take 1,000 Units by mouth daily.           Marland Kitchen FERROUS SULFATE 325 (65 FE) MG PO TABS   Oral   Take 325 mg by mouth 2 (two) times daily.          Marland Kitchen GABAPENTIN 300 MG PO CAPS   Oral   Take 1 capsule (300 mg total) by mouth 4 (four) times daily.   360 capsule   2   . METHOCARBAMOL 750 MG PO TABS   Oral   Take 750 mg by mouth 2 (two) times daily.         . SUPER HIGH VITAMINS/MINERALS PO TABS   Oral   Take 1 tablet by mouth daily.           Marland Kitchen PREDNISONE 5 MG PO TABS   Oral   Take 15 mg by mouth daily. Takes 1(5mg ) tablet with 1(10mg ) tablet for total dose 15mg          . SIMVASTATIN 5 MG PO  TABS   Oral   Take 1 tablet (5 mg total) by mouth at bedtime.   90 tablet   3   . DENOSUMAB 60 MG/ML [place] SOLN   Subcutaneous   Inject 60 mg into the skin every 6 (six) months.           . FUROSEMIDE 40 MG PO TABS   Oral   Take 1 tablet (40 mg total) by mouth 2 (two) times daily.   30 tablet      . LEVOFLOXACIN 750 MG PO TABS   Oral   Take 1 tablet (750 mg total) by mouth every other day.   5 tablet   0   . OXYCODONE HCL ER 10 MG PO TB12      Take 10 mg by mouth 2 (two) times daily. Fill on or After12/08/2012   60 tablet   0   . OXYCODONE HCL 10 MG PO TABS   Oral   Take 1 tablet (10 mg total) by mouth every 4 (four) hours as needed. Breakthrough pain.   180 tablet   0   . POTASSIUM CHLORIDE ER 10 MEQ PO TBCR   Oral   Take 1 tablet (10 mEq total) by mouth 2 (two) times daily.           BP 127/66  Pulse 80  Temp 98.3 F (36.8 C) (Oral)  Resp 16  Ht 5' 2.5" (1.588 m)  Wt 213 lb 1.9 oz (96.671 kg)  BMI 38.36 kg/m2  SpO2 94% Vitals reviewed Physical Exam Physical Examination: General appearance - alert, well appearing, and in no distress Mental status - alert, oriented to person, place, and time Eyes - no conjunctival injection, no scleral icterus, right sided conjunctival hemorrhage Mouth - mucous membranes moist, pharynx normal without lesions Chest - clear to auscultation, no wheezes, rales or rhonchi, symmetric air entry Heart - normal rate, regular rhythm, normal S1, S2, no murmurs, rubs, clicks or gallops Abdomen - soft, nontender, nondistended,  no masses or organomegaly Extremities - peripheral pulses normal, 3+ pedal edema, no clubbing or cyanosis Skin - normal coloration and turgor, beefy red erythema overlying bilateral lower extremities up to knees  ED Course  Procedures (including critical care time)  6:09 PM  D/w [facility]- pt to be admitted to med/surg bed.    Labs Reviewed  CBC - Abnormal; Notable for the following:    RBC 3.27  (*)     Hemoglobin 10.4 (*)     HCT 32.7 (*)     RDW 16.5 (*)     All other components within normal limits  BASIC METABOLIC PANEL - Abnormal; Notable for the following:    Glucose, Bld 129 (*)     BUN 25 (*)     Creatinine, Ser 1.50 (*)     GFR calc non Af Amer 34 (*)     GFR calc Af Amer 39 (*)     All other components within normal limits  PRO B NATRIURETIC PEPTIDE - Abnormal; Notable for the following:    Pro B Natriuretic peptide (BNP) 453.3 (*)     All other components within normal limits  BASIC METABOLIC PANEL - Abnormal; Notable for the following:    Potassium 3.1 (*)     Creatinine, Ser 1.40 (*)     Calcium 8.3 (*)     GFR calc non Af Amer 37 (*)     GFR calc Af Amer 42 (*)     All other components within normal limits  CBC - Abnormal; Notable for the following:    RBC 3.16 (*)     Hemoglobin 10.2 (*)     HCT 31.6 (*)     RDW 16.2 (*)     All other components within normal limits  BASIC METABOLIC PANEL - Abnormal; Notable for the following:    Potassium 3.1 (*)     CO2 33 (*)     Creatinine, Ser 1.54 (*)     GFR calc non Af Amer 33 (*)     GFR calc Af Amer 38 (*)     All other components within normal limits  PRO B NATRIURETIC PEPTIDE - Abnormal; Notable for the following:    Pro B Natriuretic peptide (BNP) 873.7 (*)     All other components within normal limits  CBC - Abnormal; Notable for the following:    RBC 3.68 (*)     Hemoglobin 11.7 (*)     RDW 16.0 (*)     All other components within normal limits  BASIC METABOLIC PANEL - Abnormal; Notable for the following:    Potassium 3.4 (*)     Glucose, Bld 100 (*)     BUN 30 (*)     Creatinine, Ser 1.58 (*)     GFR calc non Af Amer 32 (*)     GFR calc Af Amer 37 (*)     All other components within normal limits  CULTURE, BLOOD (ROUTINE X 2)  CULTURE, BLOOD (ROUTINE X 2)   No results found.   1. Lower extremity cellulitis   2. LUMBAR RADICULOPATHY, LEFT   3. Acquired absence of breast and nipple   4.  Bronchiectasis without acute exacerbation   5. Chronic renal insufficiency, stage II (mild)   6. Anemia due to chronic illness   7. Cellulitis   8. Temporal arteritis   9. Type II or unspecified type diabetes mellitus without mention of complication, not stated as uncontrolled  10. DIABETES MELLITUS, TYPE II   11. HYPERTENSION   12. RENAL INSUFFICIENCY   13. Acute on chronic renal failure   14. Other and unspecified hyperlipidemia       MDM  Pt presents with c/o increased swelling of lower extremities, along with cellulitis of lower extremities.  Pt started on vanc, admitted [facility]        Threasa Beards, MD 09/06/12 [phone]

## 2012-09-01 NOTE — Progress Notes (Signed)
ANTIBIOTIC CONSULT NOTE - INITIAL  Pharmacy Consult for Vancomycin Indication: Cellulitis  No Known Allergies  Patient Measurements:   08/28/12 Wt 97 kg,  Ht 157 cm  Vital Signs: Temp: 98.6 F (37 C) (12/09 1622) Temp src: Oral (12/09 1622) BP: 130/59 mmHg (12/09 1622) Pulse Rate: 99  (12/09 1622)  Labs:  [facility] 09/01/12 1522  WBC 10.2  HGB 10.4*  PLT 284  LABCREA --  CREATININE 1.50*   The CrCl is unknown because both a height and weight (above a minimum accepted value) are required for this calculation. CrCl (n) ~ 38 ml/min  Microbiology: No results found for this or any previous visit (from the past 720 hour(s)).  Medical History: Past Medical History  Diagnosis Date  . History of breast cancer   . Osteoporosis   . Idiopathic pulmonary fibrosis   . Bronchiectasis   . Vitamin D deficiency   . Hyperlipidemia   . Allergic rhinitis   . GERD (gastroesophageal reflux disease)   . Left knee DJD   . Anxiety   . Migraine   . Diverticulosis of colon   . Seizure disorder   . Temporal arteritis     Right eye blind, on steroids per [facility]/ Dr [name]  . CVA (cerebral infarction) 10/2007    Right thalamic   . PMR (polymyalgia rheumatica)   . Anemia   . Gait disorder   . Peripheral neuropathy   . HTN (hypertension)   . Type II or unspecified type diabetes mellitus without mention of complication, not stated as uncontrolled     2nd to steriods  . Renal insufficiency     Medications:  Anti-infectives     Start     Dose/Rate Route Frequency Ordered Stop   09/01/12 1530   vancomycin (VANCOCIN) IVPB 1000 mg/200 mL premix        1,000 mg 200 mL/hr over 60 Minutes Intravenous  Once 09/01/12 1521 09/01/12 1800         Assessment:  71 YOF presenting to ED 12/9 with c/o redness and swelling of b/l LE, concerning for cellulitis.  Recently increased torsemide dose has not improved the swelling.  CrCl (n) ~ 38 ml/min  First dose of vancomycin given in ED  12/9  Goal of Therapy:  Vancomycin trough level 10-15 mcg/ml  Plan:   Vancomycin 1g IV q24h.  Measure Vanc trough at steady state.  Follow up renal fxn and culture results.   Gretta Arab PharmD, BCPS Pager [phone] 09/01/2012 7:06 PM

## 2012-09-02 DIAGNOSIS — E785 Hyperlipidemia, unspecified: Secondary | ICD-10-CM | POA: Diagnosis not present

## 2012-09-02 DIAGNOSIS — N189 Chronic kidney disease, unspecified: Secondary | ICD-10-CM | POA: Diagnosis not present

## 2012-09-02 DIAGNOSIS — I10 Essential (primary) hypertension: Secondary | ICD-10-CM | POA: Diagnosis not present

## 2012-09-02 DIAGNOSIS — E876 Hypokalemia: Secondary | ICD-10-CM | POA: Diagnosis not present

## 2012-09-02 LAB — BASIC METABOLIC PANEL
BUN: 20 mg/dL (ref 6–23)
CO2: 32 mEq/L (ref 19–32)
Chloride: 102 mEq/L (ref 96–112)
Creatinine, Ser: 1.4 mg/dL — ABNORMAL HIGH (ref 0.50–1.10)
Potassium: 3.1 mEq/L — ABNORMAL LOW (ref 3.5–5.1)

## 2012-09-02 LAB — CBC
HCT: 31.6 % — ABNORMAL LOW (ref 36.0–46.0)
Hemoglobin: 10.2 g/dL — ABNORMAL LOW (ref 12.0–15.0)
MCHC: 32.3 g/dL (ref 30.0–36.0)
MCV: 100 fL (ref 78.0–100.0)
RDW: 16.2 % — ABNORMAL HIGH (ref 11.5–15.5)

## 2012-09-02 MED ORDER — POTASSIUM CHLORIDE CRYS ER 20 MEQ PO TBCR
20.0000 meq | EXTENDED_RELEASE_TABLET | Freq: Two times a day (BID) | ORAL | Status: DC
  Administered 2012-09-02: 20 meq via ORAL
  Filled 2012-09-02 (×3): qty 1

## 2012-09-02 MED ORDER — TORSEMIDE 20 MG PO TABS: 40.0000 mg | ORAL_TABLET | Freq: Every day | ORAL | Status: DC

## 2012-09-02 MED ORDER — FUROSEMIDE 10 MG/ML IJ SOLN
80.0000 mg | Freq: Two times a day (BID) | INTRAMUSCULAR | Status: DC
  Administered 2012-09-02 – 2012-09-05 (×6): 80 mg via INTRAVENOUS
  Filled 2012-09-02 (×9): qty 8

## 2012-09-02 NOTE — Evaluation (Signed)
Physical Therapy Evaluation Patient Details Name: [name] MRN: [id number] DOB: 1940-10-19 Today's Date: 09/02/2012 Time: [id number] PT Time Calculation (min): 18 min  PT Assessment / Plan / Recommendation Clinical Impression  71 yo female admitted with bil LE cellulits. Pt reports being modified independent prior to admission. On eval, pt requires Min-Mod assist for all mobility. Mobility is significantly limted by pain in back and LEs. Pt unable to tolerate ambulation or sitting up in recliner-requested to return back to bed. Disussed possible need for ST rehab-pt not opposed to this if needed but would prefer to return home. Pt lives alone and will need to be at least supervision level for safe d/c home.     PT Assessment  Patient needs continued PT services    Follow Up Recommendations  Home health PT;SNF (depending on progress)    Does the patient have the potential to tolerate intense rehabilitation      Barriers to Discharge        Equipment Recommendations  None recommended by PT    Recommendations for Other Services OT consult   Frequency Min 3X/week    Precautions / Restrictions Precautions Precautions: Back Precaution Comments: utililzing back precautions to limiit pain when mobilizing in bed. Pt has history of herniated discs in lower back area-reports she will have surgery in near future as soon as able.  Restrictions Weight Bearing Restrictions: No   Pertinent Vitals/Pain 9/10 lowback/bil hip area with activity      Mobility  Bed Mobility Bed Mobility: Rolling Left;Left Sidelying to Sit;Sit to Sidelying Left Rolling Left: 5: Supervision Left Sidelying to Sit: 4: Min assist Sit to Sidelying Left: 3: Mod assist Details for Bed Mobility Assistance: VCs safety, technique(logroll), hand placement. Assist for trunk to upright/sidelying and bil LEs onto bed. Increased back pain with activity.  Transfers Transfers: Sit to Stand;Stand to Sit;Stand Pivot  Transfers Sit to Stand: 4: Min assist;From bed;From elevated surface;With upper extremity assist;From chair/3-in-1 Stand to Sit: 4: Min assist;To chair/3-in-1;With armrests;With upper extremity assist;To bed Details for Transfer Assistance: VCs safety, technique, hand placement. Assist to rise, stabilize, control descent. Increased time. Pt slides feet instead of taking steps. Tends to maintain flexed trunk posture.     Shoulder Instructions     Exercises     PT Diagnosis: Difficulty walking;Acute pain;Abnormality of gait  PT Problem List: Decreased activity tolerance;Decreased mobility;Pain PT Treatment Interventions: DME instruction;Gait training;Functional mobility training;Therapeutic activities;Therapeutic exercise;Patient/family education   PT Goals Acute Rehab PT Goals PT Goal Formulation: With patient Time For Goal Achievement: 09/16/12 Potential to Achieve Goals: Fair Pt will go Supine/Side to Sit: with supervision PT Goal: Supine/Side to Sit - Progress: Goal set today Pt will go Sit to Stand: with supervision PT Goal: Sit to Stand - Progress: Goal set today Pt will go Stand to Sit: with supervision PT Goal: Stand to Sit - Progress: Goal set today Pt will Transfer Bed to Chair/Chair to Bed: with supervision PT Transfer Goal: Bed to Chair/Chair to Bed - Progress: Goal set today Pt will Ambulate: 51 - 150 feet;with supervision;with rolling walker PT Goal: Ambulate - Progress: Goal set today  Visit Information  Last PT Received On: 09/02/12 Assistance Needed:  (+1.5 safety)    Subjective Data  Subjective: "It just hurts so bad" Patient Stated Goal: Less pain. Return home   Prior [place] Lives With: Alone Type of Home: House Home Access: Ramped entrance [place]: One level Bathroom Shower/Tub: [place]: Environmental consultant -  four wheeled;Straight cane;Walker - rolling Prior Function Level of Independence: Independent with  assistive device(s);Needs assistance Needs Assistance: Light Housekeeping Light Housekeeping: Total Able to Take Stairs?: No Driving: Yes Comments: 1x/week housekeeping help. Sleeps in recliner.  Communication Communication: No difficulties    Cognition  Overall Cognitive Status: Appears within functional limits for tasks assessed/performed Arousal/Alertness: Awake/alert Orientation Level: Appears intact for tasks assessed Behavior During Session: [employer] for tasks performed    Extremity/Trunk Assessment Right Lower Extremity Assessment RLE ROM/Strength/Tone: Deficits;Unable to fully assess;Due to pain RLE ROM/Strength/Tone Deficits: Noted red and swollen LEs. Strength at least 3+/5 with functional mobility Left Lower Extremity Assessment LLE ROM/Strength/Tone: Deficits;Unable to fully assess;Due to pain LLE ROM/Strength/Tone Deficits: Noted red and swollen LEs. Strength at least 3+/5 with functional mobility   Balance    End of Session PT - End of Session Activity Tolerance: Patient limited by pain Patient left: in bed;with call bell/phone within reach  GP     [name] 09/02/2012, 3:51 PM [phone]

## 2012-09-02 NOTE — Progress Notes (Signed)
Subjective: Currently patient without fever chills. Admission history and physical reviewed, patient states she's been having 3 weeks of swelling in her legs and over the weekend began to have some redness. She denies any history of cellulitis. She denies scratching her legs. She was admitted and started on vancomycin. She still has significant pitting edema in her leg, she's unaware of if she's been toe she's had venous stasis dermatitis in the past  Objective: Vital signs in last 24 hours: Temp:  [97.9 F (36.6 C)-98.9 F (37.2 C)] 97.9 F (36.6 C) (12/10 1340) Pulse Rate:  [73-98] 98  (12/10 1340) Resp:  [18-20] 18  (12/10 1340) BP: (108-150)/(57-70) 108/67 mmHg (12/10 1340) SpO2:  [92 %-95 %] 94 % (12/10 1340) Weight:  [96.671 kg (213 lb 1.9 oz)] 96.671 kg (213 lb 1.9 oz) (12/10 0754) Weight change:  Last BM Date: 09/01/12  Intake/Output from previous day: 12/09 0701 - 12/10 0700 In: -  Out: [id number] [Urine:1650] Intake/Output this shift: Total I/O In: 960 [P.O.:960] Out: 850 [Urine:850]  General appearance: alert and cooperative Resp: clear to auscultation bilaterally Cardio: regular rate and rhythm, S1, S2 normal, no murmur, click, rub or gallop Extremities: Patient has significant edema in her legs with erythema halfway up the lower leg bilateral, there is tenderness to palpation  Lab Results:  Results for orders placed during the hospital encounter of 09/01/12 (from the past 24 hour(s))  BASIC METABOLIC PANEL     Status: Abnormal   Collection Time   09/02/12  3:55 AM      Component Value Range   Sodium 141  135 - 145 mEq/L   Potassium 3.1 (*) 3.5 - 5.1 mEq/L   Chloride 102  96 - 112 mEq/L   CO2 32  19 - 32 mEq/L   Glucose, Bld 84  70 - 99 mg/dL   BUN 20  6 - 23 mg/dL   Creatinine, Ser 1.40 (*) 0.50 - 1.10 mg/dL   Calcium 8.3 (*) 8.4 - 10.5 mg/dL   GFR calc non Af Amer 37 (*) >90 mL/min   GFR calc Af Amer 42 (*) >90 mL/min  CBC     Status: Abnormal   Collection  Time   09/02/12  3:55 AM      Component Value Range   WBC 8.2  4.0 - 10.5 K/uL   RBC 3.16 (*) 3.87 - 5.11 MIL/uL   Hemoglobin 10.2 (*) 12.0 - 15.0 g/dL   HCT 31.6 (*) 36.0 - 46.0 %   MCV 100.0  78.0 - 100.0 fL   MCH 32.3  26.0 - 34.0 pg   MCHC 32.3  30.0 - 36.0 g/dL   RDW 16.2 (*) 11.5 - 15.5 %   Platelets 257  150 - 400 K/uL      Studies/Results: No results found.  Medications:  Prior to Admission:  Prescriptions prior to admission  Medication Sig Dispense Refill  . amLODipine (NORVASC) 10 MG tablet Take 1 tablet (10 mg total) by mouth daily.  30 tablet  0  . aspirin 81 MG tablet Take 81 mg by mouth. Twice per week on wed and sunday      . calcium carbonate (OS-CAL) 600 MG TABS Take 600 mg by mouth 2 (two) times daily with a meal.      . cholecalciferol (VITAMIN D) 1000 UNITS tablet Take 1,000 Units by mouth daily.        . ferrous sulfate 325 (65 FE) MG tablet Take 325 mg by mouth 2 (  two) times daily.       Marland Kitchen gabapentin (NEURONTIN) 300 MG capsule Take 1 capsule (300 mg total) by mouth 4 (four) times daily.  360 capsule  2  . methocarbamol (ROBAXIN) 750 MG tablet Take 750 mg by mouth 2 (two) times daily.      . Multiple Vitamins-Minerals (MULTIVITAMIN,TX-MINERALS) tablet Take 1 tablet by mouth daily.        Marland Kitchen oxyCODONE (OXYCONTIN) 10 MG 12 hr tablet Take 10 mg by mouth 2 (two) times daily. Fill on or After12/08/2012  60 tablet  0  . Oxycodone HCl 10 MG TABS Take 1 tablet (10 mg total) by mouth every 4 (four) hours as needed. Breakthrough pain.  180 tablet  0  . predniSONE (DELTASONE) 5 MG tablet Take 15 mg by mouth daily. Takes 1(5mg ) tablet with 1(10mg ) tablet for total dose 15mg       . simvastatin (ZOCOR) 5 MG tablet Take 1 tablet (5 mg total) by mouth at bedtime.  90 tablet  3  . denosumab (PROLIA) 60 MG/ML SOLN Inject 60 mg into the skin every 6 (six) months.         Scheduled:   . amLODipine  10 mg Oral Daily  . aspirin EC  81 mg Oral Daily  . denosumab  60 mg  Subcutaneous Q6 months  . enoxaparin (LOVENOX) injection  40 mg Subcutaneous Q24H  . ferrous sulfate  325 mg Oral BID  . gabapentin  300 mg Oral QID  . methocarbamol  750 mg Oral BID  . OxyCODONE  10 mg Oral Q12H  . predniSONE  15 mg Oral Daily  . simvastatin  5 mg Oral QHS  . torsemide  20 mg Oral Daily  . vancomycin  1,000 mg Intravenous Q24H   Continuous:   Assessment/Plan: Patient with bilateral lower extremity edema and erythema, currently on antibiotics for cellulitis, It is somewhat unusual for someone to develop cellulitis in both legs. There is no leukocytosis, there is no fever. Her diuretics will be increased pending response will determine if antibiotics remained indicated. Diabetes Hypertension Temporize on chronic steroids Chronic kidney disease Dyslipidemia  LOS: 1 day   Maurisio Ruddy D 09/02/2012, 6:18 PM

## 2012-09-03 ENCOUNTER — Other Ambulatory Visit (HOSPITAL_COMMUNITY): Payer: Medicare Other

## 2012-09-03 DIAGNOSIS — N189 Chronic kidney disease, unspecified: Secondary | ICD-10-CM | POA: Diagnosis not present

## 2012-09-03 DIAGNOSIS — I10 Essential (primary) hypertension: Secondary | ICD-10-CM | POA: Diagnosis not present

## 2012-09-03 DIAGNOSIS — E876 Hypokalemia: Secondary | ICD-10-CM | POA: Diagnosis not present

## 2012-09-03 DIAGNOSIS — E785 Hyperlipidemia, unspecified: Secondary | ICD-10-CM | POA: Diagnosis not present

## 2012-09-03 LAB — BASIC METABOLIC PANEL
BUN: 20 mg/dL (ref 6–23)
Chloride: 103 mEq/L (ref 96–112)
Creatinine, Ser: 1.54 mg/dL — ABNORMAL HIGH (ref 0.50–1.10)
GFR calc Af Amer: 38 mL/min — ABNORMAL LOW (ref >90)
GFR calc non Af Amer: 33 mL/min — ABNORMAL LOW (ref >90)
Glucose, Bld: 84 mg/dL (ref 70–99)
Potassium: 3.1 mEq/L — ABNORMAL LOW (ref 3.5–5.1)

## 2012-09-03 MED ORDER — POTASSIUM CHLORIDE CRYS ER 20 MEQ PO TBCR
30.0000 meq | EXTENDED_RELEASE_TABLET | Freq: Two times a day (BID) | ORAL | Status: DC
  Administered 2012-09-03 – 2012-09-05 (×5): 30 meq via ORAL
  Filled 2012-09-03 (×6): qty 1

## 2012-09-03 NOTE — Progress Notes (Signed)
CARE MANAGEMENT NOTE 09/03/2012  Patient:  TYLOR, BRUNI   Account Number:  [id number]  Date Initiated:  09/03/2012  Documentation initiated by:  PEARSON,COOKIE  Subjective/Objective Assessment:   pt admitted with cco lower extremity swelling, cellulitis in lower extremities     Action/Plan:   pt lives alone/ plan to dc to SNF   Anticipated DC Date:  09/04/2012   Anticipated DC Plan:  [place]  In-house referral  Clinical Social Worker      DC Planning Services  CM consult      Choice offered to / List presented to:             Status of service:  In process, will continue to follow Medicare Important Message given?  NA - LOS <3 / Initial given by admissions (If response is "NO", the following Medicare IM given date fields will be blank) Date Medicare IM given:   Date Additional Medicare IM given:    Discharge Disposition:  [place]  Per UR Regulation:  Reviewed for med. necessity/level of care/duration of stay  If discussed at [place] of Stay Meetings, dates discussed:    Comments:  09/03/12 MPearson, RN, BSN Chart reviewed. Pt plan to dc to SNF at discharge.  09/02/12 MPearson, RN, BSN Pt's son Dr. Karolee Stamps called, he stated that pt his mother will lie about someone being in the home with her 24/7, there is no one in the home, no sitter with the pt. He would like for her to go to [facility] or SNF.  Pt's son Dr. Karlene Lineman [phone].

## 2012-09-03 NOTE — Clinical Social Work Psychosocial (Signed)
     Clinical Social Work Department BRIEF PSYCHOSOCIAL ASSESSMENT 09/03/2012  Patient:  [name], [name]     Account Number:  [id number]     Admit date:  09/01/2012  Clinical Social Worker:  Ulyess Blossom  Date/Time:  09/03/2012 01:40 AM  Referred by:  Physician  Date Referred:  09/03/2012 Referred for  SNF Placement   Other Referral:   Interview type:  Patient Other interview type:    PSYCHOSOCIAL DATA Living Status:  ALONE Admitted from facility:   Level of care:   Primary support name:  [name]/neighbor Primary support relationship to patient:  FRIEND Degree of support available:   Pt reports that pt son lives in [place], [place] and is supportive    CURRENT CONCERNS Current Concerns  Post-Acute Placement   Other Concerns:    SOCIAL WORK ASSESSMENT / PLAN CSW met with pt re: disposition plans. Noted PT recommendation for ST-SNF. Discussed with pt who is agreeable to SNF search and stated that she was hopeful that she would be able to return home, but recognizes that she will not be able to care for herself at home alone at this time.    CSW completed FL2 and initiated SNF search to [facility]. Pt expressed interest in [facility]. CSW to follow up with pt in regard to bed offers. CSW to continue to follow and facilitate pt discharge needs when pt medically ready for discharge.   Assessment/plan status:  Psychosocial Support/Ongoing Assessment of Needs Other assessment/ plan:   discharge planning   Information/referral to community resources:   [facility] list    PATIENTS/FAMILYS RESPONSE TO PLAN OF CARE: Pt alert and oriented x 4. Pt pleasant to speak with and states that she was initially hesitant about ST SNF, but is "scared to go home alone" at this time because she fears that she will be unable to care for herself.

## 2012-09-03 NOTE — Progress Notes (Addendum)
Clinical Social Work Department CLINICAL SOCIAL WORK PLACEMENT NOTE 09/03/2012  Patient:  [name], [name]  Account Number:  [id number] Admit date:  09/01/2012  Clinical Social Worker:  Ulyess Blossom  Date/time:  09/03/2012 02:30 PM  Clinical Social Work is seeking post-discharge placement for this patient at the following level of care:   SKILLED NURSING   (*CSW will update this form in Epic as items are completed)   09/03/2012  Patient/family provided with [place] Department of Clinical Social Work's list of facilities offering this level of care within the geographic area requested by the patient (or if unable, by the patient's family).  09/03/2012  Patient/family informed of their freedom to choose among providers that offer the needed level of care, that participate in Medicare, Medicaid or managed care program needed by the patient, have an available bed and are willing to accept the patient.  09/03/2012  Patient/family informed of [facility]' ownership interest in [facility], as well as of the fact that they are under no obligation to receive care at this facility.  PASARR submitted to EDS on 09/03/2012 PASARR number received from EDS on 09/03/2012  FL2 transmitted to all facilities in geographic area requested by pt/family on  09/03/2012 FL2 transmitted to all facilities within larger geographic area on   Patient informed that his/her managed care company has contracts with or will negotiate with  certain facilities, including the following:     Patient/family informed of bed offers received:  09/04/2012 Patient chooses bed at [facility] Physician recommends and patient chooses bed at    Patient to be transferred to  on  [facility] on 09/05/2012 Patient to be transferred to facility by ambulance [facility])  The following physician request were entered in Epic:   Additional Comments:   Drake Leach, MSW, [place] Work [phone]

## 2012-09-03 NOTE — Progress Notes (Signed)
Physical Therapy Treatment Patient Details Name: [name] MRN: [id number] DOB: June 26, 1941 Today's Date: 09/03/2012 Time: [id number] PT Time Calculation (min): 14 min  PT Assessment / Plan / Recommendation Comments on Treatment Session  Pt states her legs feel better but not yet back to normal.  Stated she was suppose to see her Neurologist todat at 4:30 re L5/S1 disc pain. Assisted pt OOB with increased time due to back pain and amb a limited distance in the hallway.    Follow Up Recommendations  Home health PT;SNF (depending on progress and assist level at home)     Does the patient have the potential to tolerate intense rehabilitation     Barriers to Discharge        Equipment Recommendations  None recommended by PT    Recommendations for Other Services    Frequency Min 3X/week   Plan Discharge plan remains appropriate    Precautions / Restrictions     Pertinent Vitals/Pain C/o 8/10 low back pain after session RN notified    Mobility  Bed Mobility Bed Mobility: Rolling Right;Supine to Sit;Sit to Supine Rolling Left: 5: Supervision (bed flat and use of rail) Sit to Supine: 3: Mod assist (to support B LE up onto bed to decrease back pain) Transfers Transfers: Sit to Stand;Stand to Sit Sit to Stand: 4: Min assist;From bed Stand to Sit: 4: Min assist;To bed Details for Transfer Assistance: increased time and <25% VC's on proper tech and hand placement. Uncontrolled desend on bed. Ambulation/Gait Ambulation/Gait Assistance: 4: Min guard;4: Min Wellsite geologist (Feet): 28 Feet Assistive device: Rolling walker Ambulation/Gait Assistance Details: Pt is use to her rollator walker with the seat and tend to amb flexed over stating because she has a bad back and has had several back surgeries. Gait Pattern: Step-through pattern;Decreased stride length;Trunk flexed Gait velocity: decreased     PT Goals                                                   progressing    Visit Information  Last PT Received On: 09/03/12 Assistance Needed: +1    Subjective Data  Subjective: I have a bad back Patient Stated Goal: less pain   Cognition    good   Balance   fair  End of Session PT - End of Session Equipment Utilized During Treatment: Gait belt Activity Tolerance: Patient limited by pain Patient left: in bed;with call bell/phone within reach (called for pain meds for back pain)   Rica Koyanagi  PTA [facility]  Acute  Rehab Pager     [phone]

## 2012-09-03 NOTE — Progress Notes (Signed)
Subjective: No problems overnight, no fever no chills. Edema in the legs less with elevation and increase in Lasix.  Objective: Vital signs in last 24 hours: Temp:  [97.9 F (36.6 C)-98.5 F (36.9 C)] 98.5 F (36.9 C) (12/11 0539) Pulse Rate:  [82-98] 88  (12/11 0539) Resp:  [15-18] 15  (12/11 0539) BP: (108-136)/(58-75) 136/75 mmHg (12/11 0539) SpO2:  [92 %-96 %] 92 % (12/11 0539) Weight change:  Last BM Date: 09/02/12  Intake/Output from previous day: 12/10 0701 - 12/11 0700 In: 1080 [P.O.:1080] Out: 1550 [Urine:1550] Intake/Output this shift:    General appearance: alert and cooperative Resp: bilateral crackles Cardio: regular rate and rhythm, S1, S2 normal, no murmur, click, rub or gallop Extremities: less edema legs less tight. No warmth there is darkening of the legs up to the knee.  Lab Results:  Results for orders placed during the hospital encounter of 09/01/12 (from the past 24 hour(s))  PRO B NATRIURETIC PEPTIDE     Status: Abnormal   Collection Time   09/03/12  4:00 AM      Component Value Range   Pro B Natriuretic peptide (BNP) 873.7 (*) 0 - 125 pg/mL  BASIC METABOLIC PANEL     Status: Abnormal   Collection Time   09/03/12  4:10 AM      Component Value Range   Sodium 145  135 - 145 mEq/L   Potassium 3.1 (*) 3.5 - 5.1 mEq/L   Chloride 103  96 - 112 mEq/L   CO2 33 (*) 19 - 32 mEq/L   Glucose, Bld 84  70 - 99 mg/dL   BUN 20  6 - 23 mg/dL   Creatinine, Ser 1.54 (*) 0.50 - 1.10 mg/dL   Calcium 8.7  8.4 - 10.5 mg/dL   GFR calc non Af Amer 33 (*) >90 mL/min   GFR calc Af Amer 38 (*) >90 mL/min      Studies/Results: No results found.  Medications:  Prior to Admission:  Prescriptions prior to admission  Medication Sig Dispense Refill  . amLODipine (NORVASC) 10 MG tablet Take 1 tablet (10 mg total) by mouth daily.  30 tablet  0  . aspirin 81 MG tablet Take 81 mg by mouth. Twice per week on wed and sunday      . calcium carbonate (OS-CAL) 600 MG TABS  Take 600 mg by mouth 2 (two) times daily with a meal.      . cholecalciferol (VITAMIN D) 1000 UNITS tablet Take 1,000 Units by mouth daily.        . ferrous sulfate 325 (65 FE) MG tablet Take 325 mg by mouth 2 (two) times daily.       Marland Kitchen gabapentin (NEURONTIN) 300 MG capsule Take 1 capsule (300 mg total) by mouth 4 (four) times daily.  360 capsule  2  . methocarbamol (ROBAXIN) 750 MG tablet Take 750 mg by mouth 2 (two) times daily.      . Multiple Vitamins-Minerals (MULTIVITAMIN,TX-MINERALS) tablet Take 1 tablet by mouth daily.        Marland Kitchen oxyCODONE (OXYCONTIN) 10 MG 12 hr tablet Take 10 mg by mouth 2 (two) times daily. Fill on or After12/08/2012  60 tablet  0  . Oxycodone HCl 10 MG TABS Take 1 tablet (10 mg total) by mouth every 4 (four) hours as needed. Breakthrough pain.  180 tablet  0  . predniSONE (DELTASONE) 5 MG tablet Take 15 mg by mouth daily. Takes 1(5mg ) tablet with 1(10mg ) tablet for total dose  15mg       . simvastatin (ZOCOR) 5 MG tablet Take 1 tablet (5 mg total) by mouth at bedtime.  90 tablet  3  . denosumab (PROLIA) 60 MG/ML SOLN Inject 60 mg into the skin every 6 (six) months.         Scheduled:   . amLODipine  10 mg Oral Daily  . aspirin EC  81 mg Oral Daily  . denosumab  60 mg Subcutaneous Q6 months  . enoxaparin (LOVENOX) injection  40 mg Subcutaneous Q24H  . ferrous sulfate  325 mg Oral BID  . furosemide  80 mg Intravenous BID  . gabapentin  300 mg Oral QID  . methocarbamol  750 mg Oral BID  . OxyCODONE  10 mg Oral Q12H  . potassium chloride  20 mEq Oral BID  . predniSONE  15 mg Oral Daily  . simvastatin  5 mg Oral QHS  . vancomycin  1,000 mg Intravenous Q24H  . [DISCONTINUED] torsemide  20 mg Oral Daily   Continuous:   Assessment/Plan: Bilateral lower extremity edema presenting with erythema, initial concern for cellulitis. As patient is without fever, leukocytosis, antibiotics will be held at this time continue Lasix continue elevation of legs. Patient has been  keeping her legs in a dependent position for prolonged period of time, review of outpatient records suggest progressive edema. Patient denies any previous history of cellulitis Elevated BNP,progressive lower extremity edema, bilateral crackles on exam, rule out CHF, check 2-D echo Hypertension Chronic kidney disease Dyslipidemia History of temporal arteritis on chronic steroids. Hypokalemia replete orally  LOS: 2 days   [name] D 09/03/2012, 8:58 AM

## 2012-09-04 DIAGNOSIS — M316 Other giant cell arteritis: Secondary | ICD-10-CM

## 2012-09-04 DIAGNOSIS — L0390 Cellulitis, unspecified: Secondary | ICD-10-CM | POA: Diagnosis not present

## 2012-09-04 DIAGNOSIS — D638 Anemia in other chronic diseases classified elsewhere: Secondary | ICD-10-CM | POA: Diagnosis not present

## 2012-09-04 DIAGNOSIS — L02419 Cutaneous abscess of limb, unspecified: Secondary | ICD-10-CM | POA: Diagnosis not present

## 2012-09-04 DIAGNOSIS — IMO0002 Reserved for concepts with insufficient information to code with codable children: Secondary | ICD-10-CM

## 2012-09-04 DIAGNOSIS — L0291 Cutaneous abscess, unspecified: Secondary | ICD-10-CM

## 2012-09-04 DIAGNOSIS — R609 Edema, unspecified: Secondary | ICD-10-CM

## 2012-09-04 DIAGNOSIS — N182 Chronic kidney disease, stage 2 (mild): Secondary | ICD-10-CM | POA: Diagnosis not present

## 2012-09-04 DIAGNOSIS — E119 Type 2 diabetes mellitus without complications: Secondary | ICD-10-CM

## 2012-09-04 MED ORDER — LEVOFLOXACIN 750 MG PO TABS
750.0000 mg | ORAL_TABLET | Freq: Every day | ORAL | Status: DC
  Administered 2012-09-04 – 2012-09-05 (×2): 750 mg via ORAL
  Filled 2012-09-04 (×2): qty 1

## 2012-09-04 NOTE — Progress Notes (Signed)
Clinical Social Worker met with pt at bedside. Pt stated that she is interested in [facility]. Per MD note, planning for d/c on Friday 12/13. Clinical Social Worker spoke with [facility] who confirmed bed availability for tomorrow. Clinical Social Worker discussed with pt. Clinical Social Worker to facilitate pt discharge needs when pt medically stable for discharge.  Drake Leach, MSW, [place] Work [phone]

## 2012-09-04 NOTE — Progress Notes (Signed)
Subjective: [name] is a 71 y/o woman with multiple medical problems who has chronic venous insufficiency and LE edema. She was admitted to increased LE edema with hyperemia, tenderness to touch and heat to touch. She was originally treated with vancomycin for potential cellulitis in an immunocompromised host - chronic steroid use. She reports that the swelling is improved but her legs remain red, hot and painful.  She has known DDD L5-S1 and this is a source of constant pain. She is followed by neurosurgery at [facility] and was unable to go to her appointment 12/11 and is rescheduled for 10/01/12.  Per RN patient has not had a BM for several days.   Objective: Lab: Lab Results  Component Value Date   WBC 8.2 09/02/2012   HGB 10.2* 09/02/2012   HCT 31.6* 09/02/2012   MCV 100.0 09/02/2012   PLT 257 09/02/2012   BMET    Component Value Date/Time   NA 145 09/03/2012 0410   K 3.1* 09/03/2012 0410   CL 103 09/03/2012 0410   CO2 33* 09/03/2012 0410   GLUCOSE 84 09/03/2012 0410   GLUCOSE 132* 10/01/2006 1605   BUN 20 09/03/2012 0410   CREATININE 1.54* 09/03/2012 0410   CREATININE 2.28* 02/08/2011 0819   CALCIUM 8.7 09/03/2012 0410   GFRNONAA 33* 09/03/2012 0410   GFRAA 38* 09/03/2012 0410     Imaging:  Scheduled Meds:   . amLODipine  10 mg Oral Daily  . aspirin EC  81 mg Oral Daily  . denosumab  60 mg Subcutaneous Q6 months  . enoxaparin (LOVENOX) injection  40 mg Subcutaneous Q24H  . ferrous sulfate  325 mg Oral BID  . furosemide  80 mg Intravenous BID  . gabapentin  300 mg Oral QID  . methocarbamol  750 mg Oral BID  . OxyCODONE  10 mg Oral Q12H  . potassium chloride  30 mEq Oral BID  . predniSONE  15 mg Oral Daily  . simvastatin  5 mg Oral QHS  . [DISCONTINUED] potassium chloride  20 mEq Oral BID  . [DISCONTINUED] vancomycin  1,000 mg Intravenous Q24H   Continuous Infusions:  PRN Meds:.ondansetron (ZOFRAN) IV, ondansetron, oxyCODONE   Physical Exam: Filed Vitals:   09/04/12 0505  BP: 121/59  Pulse: 78  Temp: 98.4 F (36.9 C)  Resp: 17  gen'l - nice woman who looks older than her stated age in no acute distress HEENT_ C&S clear Cor- 2+ radial and DP pulses Pulm - normal respirations Derm - bilateral lower extremity hyperemia, calore, dolore with chronic skin changes.      Assessment/Plan: 1. ID - patient has not mustered a leukocytosis but her legs are red, warm and tender. They do look worse than her baseline Plan avelox 400 mg daily  2. DM - normal A1c readings for over a year. Has been diet managed.  3. HTN - stable  4. TA - on chronic steroids for management. Follows with Dr. Floyde Parkins.  Plan  Continue present dose of prednisone   5. dispo - she is agreeable for ST-SNF. Should be ready for transfer 12/13        [name] [place] [facility] (o) [phone]; (c) [phone] [place]: [phone]  09/04/2012, 7:39 AM

## 2012-09-04 NOTE — Progress Notes (Signed)
  Echocardiogram 2D Echocardiogram has been performed.  Rayland Hamed 09/04/2012, 3:08 PM

## 2012-09-05 DIAGNOSIS — M4120 Other idiopathic scoliosis, site unspecified: Secondary | ICD-10-CM | POA: Diagnosis not present

## 2012-09-05 DIAGNOSIS — N179 Acute kidney failure, unspecified: Secondary | ICD-10-CM

## 2012-09-05 DIAGNOSIS — R262 Difficulty in walking, not elsewhere classified: Secondary | ICD-10-CM | POA: Diagnosis not present

## 2012-09-05 DIAGNOSIS — E785 Hyperlipidemia, unspecified: Secondary | ICD-10-CM

## 2012-09-05 DIAGNOSIS — M6281 Muscle weakness (generalized): Secondary | ICD-10-CM | POA: Diagnosis not present

## 2012-09-05 DIAGNOSIS — E119 Type 2 diabetes mellitus without complications: Secondary | ICD-10-CM | POA: Diagnosis not present

## 2012-09-05 DIAGNOSIS — E039 Hypothyroidism, unspecified: Secondary | ICD-10-CM | POA: Diagnosis not present

## 2012-09-05 DIAGNOSIS — M8448XA Pathological fracture, other site, initial encounter for fracture: Secondary | ICD-10-CM | POA: Diagnosis not present

## 2012-09-05 DIAGNOSIS — M51379 Other intervertebral disc degeneration, lumbosacral region without mention of lumbar back pain or lower extremity pain: Secondary | ICD-10-CM | POA: Diagnosis present

## 2012-09-05 DIAGNOSIS — K219 Gastro-esophageal reflux disease without esophagitis: Secondary | ICD-10-CM | POA: Diagnosis present

## 2012-09-05 DIAGNOSIS — L02419 Cutaneous abscess of limb, unspecified: Secondary | ICD-10-CM | POA: Diagnosis not present

## 2012-09-05 DIAGNOSIS — N189 Chronic kidney disease, unspecified: Secondary | ICD-10-CM | POA: Diagnosis not present

## 2012-09-05 DIAGNOSIS — IMO0002 Reserved for concepts with insufficient information to code with codable children: Secondary | ICD-10-CM | POA: Diagnosis not present

## 2012-09-05 DIAGNOSIS — Z981 Arthrodesis status: Secondary | ICD-10-CM | POA: Diagnosis not present

## 2012-09-05 DIAGNOSIS — E559 Vitamin D deficiency, unspecified: Secondary | ICD-10-CM | POA: Diagnosis not present

## 2012-09-05 DIAGNOSIS — R52 Pain, unspecified: Secondary | ICD-10-CM | POA: Diagnosis not present

## 2012-09-05 DIAGNOSIS — M47817 Spondylosis without myelopathy or radiculopathy, lumbosacral region: Secondary | ICD-10-CM | POA: Diagnosis present

## 2012-09-05 DIAGNOSIS — D638 Anemia in other chronic diseases classified elsewhere: Secondary | ICD-10-CM | POA: Diagnosis not present

## 2012-09-05 DIAGNOSIS — I499 Cardiac arrhythmia, unspecified: Secondary | ICD-10-CM | POA: Diagnosis not present

## 2012-09-05 DIAGNOSIS — M5137 Other intervertebral disc degeneration, lumbosacral region: Secondary | ICD-10-CM | POA: Diagnosis not present

## 2012-09-05 DIAGNOSIS — M545 Low back pain: Secondary | ICD-10-CM | POA: Diagnosis not present

## 2012-09-05 DIAGNOSIS — G608 Other hereditary and idiopathic neuropathies: Secondary | ICD-10-CM | POA: Diagnosis not present

## 2012-09-05 DIAGNOSIS — M316 Other giant cell arteritis: Secondary | ICD-10-CM | POA: Diagnosis not present

## 2012-09-05 DIAGNOSIS — N259 Disorder resulting from impaired renal tubular function, unspecified: Secondary | ICD-10-CM | POA: Diagnosis not present

## 2012-09-05 DIAGNOSIS — Z8673 Personal history of transient ischemic attack (TIA), and cerebral infarction without residual deficits: Secondary | ICD-10-CM | POA: Diagnosis not present

## 2012-09-05 DIAGNOSIS — I10 Essential (primary) hypertension: Secondary | ICD-10-CM | POA: Diagnosis not present

## 2012-09-05 DIAGNOSIS — I517 Cardiomegaly: Secondary | ICD-10-CM | POA: Diagnosis not present

## 2012-09-05 DIAGNOSIS — L0390 Cellulitis, unspecified: Secondary | ICD-10-CM | POA: Diagnosis not present

## 2012-09-05 DIAGNOSIS — R279 Unspecified lack of coordination: Secondary | ICD-10-CM | POA: Diagnosis not present

## 2012-09-05 DIAGNOSIS — M810 Age-related osteoporosis without current pathological fracture: Secondary | ICD-10-CM | POA: Diagnosis not present

## 2012-09-05 LAB — BASIC METABOLIC PANEL
CO2: 30 mEq/L (ref 19–32)
Calcium: 9.1 mg/dL (ref 8.4–10.5)
Creatinine, Ser: 1.58 mg/dL — ABNORMAL HIGH (ref 0.50–1.10)
GFR calc non Af Amer: 32 mL/min — ABNORMAL LOW (ref >90)
Glucose, Bld: 100 mg/dL — ABNORMAL HIGH (ref 70–99)
Sodium: 140 mEq/L (ref 135–145)

## 2012-09-05 LAB — CBC
Hemoglobin: 11.7 g/dL — ABNORMAL LOW (ref 12.0–15.0)
MCH: 31.8 pg (ref 26.0–34.0)
MCHC: 32 g/dL (ref 30.0–36.0)
MCV: 99.5 fL (ref 78.0–100.0)
RBC: 3.68 MIL/uL — ABNORMAL LOW (ref 3.87–5.11)

## 2012-09-05 MED ORDER — FUROSEMIDE 40 MG PO TABS: 40.0000 mg | ORAL_TABLET | Freq: Two times a day (BID) | ORAL | Status: DC

## 2012-09-05 MED ORDER — OXYCODONE HCL 10 MG PO TABS: 10.0000 mg | ORAL_TABLET | ORAL | Status: DC | PRN

## 2012-09-05 MED ORDER — OXYCODONE HCL 10 MG PO TB12: ORAL_TABLET | ORAL | Status: DC

## 2012-09-05 MED ORDER — LEVOFLOXACIN 750 MG PO TABS: 750.0000 mg | ORAL_TABLET | ORAL | Status: DC

## 2012-09-05 MED ORDER — POTASSIUM CHLORIDE ER 10 MEQ PO TBCR: 10.0000 meq | EXTENDED_RELEASE_TABLET | Freq: Two times a day (BID) | ORAL | Status: DC

## 2012-09-05 NOTE — Progress Notes (Signed)
Clinical Social Worker facilitated pt discharge needs including contacting the facility, faxing pt discharge information via [facility], discussing with pt at bedside,providing contact number to RN to call report, and arranging ambulance transportation for pt to [facility]. No further social work needs identified at this time. Clinical Social Worker signing off.   Drake Leach, MSW, [place] [facility] [phone]

## 2012-09-05 NOTE — Discharge Summary (Signed)
[name], UDO NO.:  [id number]  MEDICAL RECORD NO.:  [id number]  LOCATION:  20                         FACILITY:  [facility]  PHYSICIAN:  Heinz Knuckles. Norins, MD  DATE OF BIRTH:  08/14/1941  DATE OF ADMISSION:  09/01/2012 DATE OF DISCHARGE:                              DISCHARGE SUMMARY   The patient ready for transfer to skilled care on 09/05/2012.  ADMITTING DIAGNOSES: 1. Erythema and swelling, lower extremity. 2. Hypertension. 3. Temporal arteritis. 4. Acute-on-chronic renal failure. 5. Anemia of chronic disease. 6. Hyperlipidemia.  DISCHARGE DIAGNOSES: 1. Cellulitis, distal lower extremities. 2. Erythema and swelling, lower extremity. 3. Hypertension. 4. Temporal arteritis. 5. Acute-on-chronic renal failure. 6. Anemia of chronic disease. 7. Hyperlipidemia.  CONSULTANTS:  None.  PROCEDURES:  None.  HISTORY OF PRESENT ILLNESS:  [name] is a 71 year old woman with multiple medical problems including temporal arteritis, on chronic prednisone; hyperlipidemia; hypertension; CKD stage II; and history of breast cancer, who presents to [facility] Emergency Department with a 1- week history of progressive bilateral lower extremity swelling, now extending to above her knees associated with erythema and tenderness to touch.  She also complained of generalized malaise and pain especially with ambulation.  Recently, her dose of Lasix was increased from 40 to 60 mg daily which did help reduce her swelling, but the redness has progressed now on the left and her pain was not relieved at usual home analgesic regimen.  She was subsequently admitted for treatment of cellulitis.  Please see the H and P as well as previous epic records for past medical history, family history, and social history.  PHYSICAL EXAMINATION:  VITAL SIGNS:  At admission, the patient had temperature of 98.7; blood pressure 141/59; pulse was 101.  Exam was significant for a regular  tachycardia. LUNGS:  Significant for her lungs being clear with no rales, wheezes, or rhonchi. EXTREMITIES:  Significant for bilateral lower extremity edema with erythema and tenderness to touch and warm to touch.  HOSPITAL COURSE: 1. Lower extremity edema with cellulitis.  The patient was initially     started on IV antibiotics with vancomycin and Zosyn.  It was felt     that she had no leukocytosis and her legs were improved that she     did need ongoing IV antibiotics and these were discontinued.  On     the 12th, the patient was seen and her legs continued to have     significant erythema, tenderness, and warmth to touch, and she was     restarted on antibiotics using PO Levaquin at 750 mg daily,     subsequently adjusted to 750 mg q.48 hours by Pharmacy.  On this     regimen, even overnight there is decreased erythema to her distal     lower extremities.  The patient remained afebrile.  Her white count     remained normal.  At the time of this dictation, the patient will     continue for an additional 7 days of oral antibiotics dose and     Levaquin 750 mg p.o. q.48 hours. 2. Diabetes - The patient has been diet managed.  A1c readings have been normal  over the past year.  Needs no special intervention other than     continued low carb and sugar free diet. 3. Hypertension.  The patient's blood pressure has been stable. 4. Temporal arteritis.  The patient has been managed on chronic     steroids.  She has periodic evaluation with sedimentation rate with     most recent ESR on July 24, 2012, which was at 24.  She will     continue on her home regimen of prednisone.  Continue to follow     with Dr. [name]. 5. Hyperlipidemia.  The patient's last lipid panel dates from March 30, 2008, at which time she had an LDL of 87.  She will need a repeat     lipid panel which can be done as an outpatient.  She should     follow a low-fat diet at this time and continue low dose  simvastatin. 6. CKD.  The patient has had chronic elevated creatinine in the 1.5     range.  During this hospital stay, she did remain stable.  Last     correspondence from Dr. Marval Regal dates from May 05, 2012, at     which point, the patient was felt to be stable.  Tight risk-factor     control was recommended and the avoidance of nephrotoxic agents.     She would be seen in followup by Dr. Marval Regal in February or     March as planned as long as she remains stable. 7. Rheumatology.  The patient has been followed by Dr. Amil Amen.  She     is sero-positive and CC positive rheumatoid arthritis.  At this point,     she has not been on any DMARD treatment.  Her last office visit     with Dr. Amil Amen dates back to July, 2012. 8. Degenerative disk disease.  The patient does have L5-S1     degenerative disk disease.  She has had prior surgery for disk     disease.  At this time, she has been seeing Dr. Laruth Bouchard at [facility].  She missed an appointment to see him due to this     hospitalization, is rescheduled to see him on January 8 and is     being considered for redo surgery because of her significant and     persistent low back pain and pain that radiates to her legs.  Plan     will be to continue her pain medications.  Her ability to     participate in physical therapy may be limited by her back     discomfort.  Hopefully, she will be able to keep her appointment     with Dr. Harl Bowie on January 8th as scheduled.  DISCHARGE EXAMINATION:  VITAL SIGNS:  Temperature was 98.3; blood pressure 127/66; pulse was 80; respirations 16; oxygen saturations 94%. GENERAL APPEARANCE:  This is a well-nourished, chronically ill-appearing woman, in no acute distress. HEENT:  Notable for having proptosis.  Bulbar conjunctiva is clear. PERRLA.  Oropharynx without lesions. NECK:  Supple. CHEST:  No deformity is noted. LUNGS:  The patient is moving air well with no rales wheezes or  rhonchi. CARDIOVASCULAR:  2+ radial pulse.  Her precordium is quiet.  She has a regular rate and rhythm. BREAST:  The patient has status post surgical changes in left breast with implant.  Palpation was not performed. ABDOMEN:  Soft.  No guarding or rebound  is noted. PELVIC AND GENITALIA:  Deferred. EXTREMITIES:  Upper extremities without synovial thickening or inflammation of any of the small or medium size joints.  Lower extremity with no effusions about the knee.  No synovial thickening at the toes. The patient has no other joint deformities. DERM:  The patient has chronic venous stasis changes of her legs with some drying of the skin and ridging of the scan.  She has mild erythema to the knees.  She has 1+ to 2+ edema.  There is no evidence of skin breakdown.  No ulcerations are noted. NEURO:  The patient is awake, alert.  She is oriented to person, place, time and context.  FINAL LABORATORY:  From December 13; hemoglobin was 11.7 g, platelet count 283,000, white count 7900.  Final chemistries on December, 13; sodium was 140, potassium 3.4, chloride 100, CO2 of 30, BUN of 30, creatinine 1.58.  The patient did have a 2D echo performed on September 04, 2012, which revealed mild LVH.  Systolic function of the left ventricle was estimated at 50% to 65% ejection fraction.  Wall motion was normal with no regional wall motion abnormalities.  The patient does have grade 1 diastolic dysfunction.  The patient is being medically stable with her getting a good response to oral antibiotics with her other medical problems.  Being stable, she is now ready for transfer to skilled care.  The patient was seen during this hospitalizations by PT and OT.  They did recommend the patient have skilled care rehab given her multiple medical problems, in fact, she lives alone and would not be able to manage her ADLs at this time.  The patient is in concurrence with this.  Of note, the patient's son, a  physician, has been in contact with Social Work and reports his mother underestimates her ability to manage on her own and may need to have long-term arrangements established other than independent living.  This will be handled during the course of her skilled care stay.  DISPOSITION:  Transfer to skilled care when bed available.  FOLLOWUP:  With primary MD in 7 to 10 days following discharge from skilled care.     Heinz Knuckles Norins, MD     MEN/MEDQ  D:  09/05/2012  T:  09/05/2012  Job:  [id number]

## 2012-09-05 NOTE — Progress Notes (Signed)
Subjective: Feeling better.  Objective: Lab: Lab Results  Component Value Date   WBC 7.9 09/05/2012   HGB 11.7* 09/05/2012   HCT 36.6 09/05/2012   MCV 99.5 09/05/2012   PLT 283 09/05/2012   BMET    Component Value Date/Time   NA 140 09/05/2012 0358   K 3.4* 09/05/2012 0358   CL 100 09/05/2012 0358   CO2 30 09/05/2012 0358   GLUCOSE 100* 09/05/2012 0358   GLUCOSE 132* 10/01/2006 1605   BUN 30* 09/05/2012 0358   CREATININE 1.58* 09/05/2012 0358   CREATININE 2.28* 02/08/2011 0819   CALCIUM 9.1 09/05/2012 0358   GFRNONAA 32* 09/05/2012 0358   GFRAA 37* 09/05/2012 0358     Imaging: 2 D echo 09/04/12: Study Conclusions  - Left ventricle: The cavity size was normal. Wall thickness was increased in a pattern of mild LVH. Systolic function was normal. The estimated ejection fraction was in the range of 60% to 65%. Wall motion was normal; there were no regional wall motion abnormalities. Doppler parameters are consistent with abnormal left ventricular relaxation (grade 1 diastolic dysfunction).   Scheduled Meds:   . amLODipine  10 mg Oral Daily  . aspirin EC  81 mg Oral Daily  . denosumab  60 mg Subcutaneous Q6 months  . enoxaparin (LOVENOX) injection  40 mg Subcutaneous Q24H  . ferrous sulfate  325 mg Oral BID  . furosemide  80 mg Intravenous BID  . gabapentin  300 mg Oral QID  . levofloxacin  750 mg Oral Daily  . methocarbamol  750 mg Oral BID  . OxyCODONE  10 mg Oral Q12H  . potassium chloride  30 mEq Oral BID  . predniSONE  15 mg Oral Daily  . simvastatin  5 mg Oral QHS   Continuous Infusions:  PRN Meds:.ondansetron (ZOFRAN) IV, ondansetron, oxyCODONE   Physical Exam: Filed Vitals:   09/05/12 0654  BP: 127/66  Pulse: 80  Temp: 98.3 F (36.8 C)  Resp: 16    See d/c/ summary    Assessment/Plan: Stable and ready for transfer to SNF  See D/c summary: dictated [id number]   Adella Hare [place] [facility] (o) [phone]; (c) [place]  IM  [phone]  09/05/2012, 10:37 AM

## 2012-09-05 NOTE — Progress Notes (Signed)
Pt. Was discharged to [facility].  Report was called to facility and discharge information and prescriptions were sent with transport upon discharge.

## 2012-09-08 LAB — CULTURE, BLOOD (ROUTINE X 2): Culture: NO GROWTH

## 2012-09-09 DIAGNOSIS — M545 Low back pain: Secondary | ICD-10-CM | POA: Diagnosis not present

## 2012-09-09 DIAGNOSIS — I10 Essential (primary) hypertension: Secondary | ICD-10-CM | POA: Diagnosis not present

## 2012-09-09 DIAGNOSIS — N189 Chronic kidney disease, unspecified: Secondary | ICD-10-CM | POA: Diagnosis not present

## 2012-09-09 DIAGNOSIS — L03119 Cellulitis of unspecified part of limb: Secondary | ICD-10-CM | POA: Diagnosis not present

## 2012-09-10 DIAGNOSIS — I10 Essential (primary) hypertension: Secondary | ICD-10-CM | POA: Diagnosis not present

## 2012-09-10 DIAGNOSIS — N189 Chronic kidney disease, unspecified: Secondary | ICD-10-CM | POA: Diagnosis not present

## 2012-09-10 DIAGNOSIS — L02419 Cutaneous abscess of limb, unspecified: Secondary | ICD-10-CM | POA: Diagnosis not present

## 2012-09-10 DIAGNOSIS — M545 Low back pain: Secondary | ICD-10-CM | POA: Diagnosis not present

## 2012-09-11 DIAGNOSIS — L02419 Cutaneous abscess of limb, unspecified: Secondary | ICD-10-CM | POA: Diagnosis not present

## 2012-09-11 DIAGNOSIS — M545 Low back pain: Secondary | ICD-10-CM | POA: Diagnosis not present

## 2012-09-11 DIAGNOSIS — N189 Chronic kidney disease, unspecified: Secondary | ICD-10-CM | POA: Diagnosis not present

## 2012-09-11 DIAGNOSIS — I10 Essential (primary) hypertension: Secondary | ICD-10-CM | POA: Diagnosis not present

## 2012-09-19 DIAGNOSIS — M545 Low back pain: Secondary | ICD-10-CM | POA: Diagnosis not present

## 2012-09-19 DIAGNOSIS — I10 Essential (primary) hypertension: Secondary | ICD-10-CM | POA: Diagnosis not present

## 2012-09-19 DIAGNOSIS — N189 Chronic kidney disease, unspecified: Secondary | ICD-10-CM | POA: Diagnosis not present

## 2012-09-19 DIAGNOSIS — L03119 Cellulitis of unspecified part of limb: Secondary | ICD-10-CM | POA: Diagnosis not present

## 2012-09-25 ENCOUNTER — Ambulatory Visit (INDEPENDENT_AMBULATORY_CARE_PROVIDER_SITE_OTHER): Payer: Medicare Other | Admitting: Internal Medicine

## 2012-09-25 ENCOUNTER — Ambulatory Visit: Payer: Medicare Other | Admitting: Internal Medicine

## 2012-09-25 ENCOUNTER — Other Ambulatory Visit (INDEPENDENT_AMBULATORY_CARE_PROVIDER_SITE_OTHER): Payer: Medicare Other

## 2012-09-25 ENCOUNTER — Encounter: Payer: Self-pay | Admitting: Internal Medicine

## 2012-09-25 VITALS — BP 116/72 | HR 98 | Temp 98.3°F | Resp 10 | Wt 193.0 lb

## 2012-09-25 DIAGNOSIS — M316 Other giant cell arteritis: Secondary | ICD-10-CM

## 2012-09-25 DIAGNOSIS — R609 Edema, unspecified: Secondary | ICD-10-CM | POA: Insufficient documentation

## 2012-09-25 DIAGNOSIS — N259 Disorder resulting from impaired renal tubular function, unspecified: Secondary | ICD-10-CM

## 2012-09-25 DIAGNOSIS — E119 Type 2 diabetes mellitus without complications: Secondary | ICD-10-CM | POA: Diagnosis not present

## 2012-09-25 DIAGNOSIS — IMO0002 Reserved for concepts with insufficient information to code with codable children: Secondary | ICD-10-CM | POA: Diagnosis not present

## 2012-09-25 DIAGNOSIS — I10 Essential (primary) hypertension: Secondary | ICD-10-CM

## 2012-09-25 DIAGNOSIS — R600 Localized edema: Secondary | ICD-10-CM | POA: Insufficient documentation

## 2012-09-25 DIAGNOSIS — N182 Chronic kidney disease, stage 2 (mild): Secondary | ICD-10-CM

## 2012-09-25 NOTE — Assessment & Plan Note (Signed)
Continued edema - w/o pitting. Chronic erythema secondary to hyperemia. No skin breakdown at today's visit.  Plan - elevated legs as possible  Continue present medications including furosemide  Application of 4" ACE bandage from foot to knee applied every AM and taken off at bedtime. Should be snug at application, not tight.

## 2012-09-25 NOTE — Assessment & Plan Note (Signed)
Continues on prednisone 15 mg daily.  Plan -  Sedimentation rate today.

## 2012-09-25 NOTE — Assessment & Plan Note (Signed)
See above for progressive values. Appears to be stable.

## 2012-09-25 NOTE — Progress Notes (Signed)
Subjective:    Patient ID: [name], female    DOB: [birth year], 72 y.o.   MRN: [id number]  HPI [name] presents for follow up after hospitalization for lower extremity edema with cellulitis. Hospital records reviewed. She did receive IV antibioitics in hospital and was discharged to complete oral levaquin at SNF. She reports that her legs continue to swell and she has chronic skin changes. She has not had any drainage but her legs remain tender.  At SNF ([facility]) she is getting PT and strengthening to help her prepare for up-coming back surgery. She has also continued to take prednisone 15 mg daily for temporal arteritis.   She has no other medical complaints.  Past Medical History  Diagnosis Date  . History of breast cancer   . Osteoporosis   . Idiopathic pulmonary fibrosis   . Bronchiectasis   . Vitamin D deficiency   . Hyperlipidemia   . Allergic rhinitis   . GERD (gastroesophageal reflux disease)   . Left knee DJD   . Anxiety   . Migraine   . Diverticulosis of colon   . Seizure disorder   . Temporal arteritis     Right eye blind, on steroids per [facility]/ Dr [name]  . CVA (cerebral infarction) 10/2007    Right thalamic   . PMR (polymyalgia rheumatica)   . Anemia   . Gait disorder   . Peripheral neuropathy   . HTN (hypertension)   . Type II or unspecified type diabetes mellitus without mention of complication, not stated as uncontrolled     2nd to steriods  . Renal insufficiency    Past Surgical History  Procedure Date  . Mastectomy   . Cholecystectomy   . Total knee arthroplasty     Left  . Tonsillectomy   . Cataract extraction     OS - Summer 2010  . Lumbar fusion 04/2010    W/Mechanical fixation - [facility]  . Spinal fusion 07/2010    T10-L2 interbody fusion / [facility]  . Colonoscopy 12/15/2011    Procedure: COLONOSCOPY;  Surgeon: [name], MD;  Location: [facility] ENDOSCOPY;  Service: Endoscopy;  Laterality: N/A;   Family  History  Problem Relation Age of Onset  . Brain cancer Mother   . Hypertension Mother   . Dementia Father   . Hypertension Father   . Breast cancer Sister    History   Social History  . Marital Status: Widowed    Spouse Name: N/A    Number of Children: 1  . Years of Education: N/A   Occupational History  . Land    Social History Main Topics  . Smoking status: Never Smoker   . Smokeless tobacco: Never Used  . Alcohol Use: No     Comment: heavy drinker until 1995 - sobriety with AA  . Drug Use: No  . Sexually Active: Not Currently   Other Topics Concern  . Not on file   Social History Narrative      Married - husband invalid with stroke - widowed in 2009. 1 chil. 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 ACP - not formerly discussed - wishes to be a full code.     Current Outpatient Prescriptions on File Prior to Visit  Medication Sig Dispense Refill  . amLODipine (NORVASC) 10 MG tablet Take 1 tablet (10 mg total) by mouth daily.  30 tablet  0  . aspirin 81 MG tablet  Take 81 mg by mouth. Twice per week on wed and sunday      . calcium carbonate (OS-CAL) 600 MG TABS Take 600 mg by mouth 2 (two) times daily with a meal.      . cholecalciferol (VITAMIN D) 1000 UNITS tablet Take 1,000 Units by mouth daily.        Marland Kitchen denosumab (PROLIA) 60 MG/ML SOLN Inject 60 mg into the skin every 6 (six) months.        . ferrous sulfate 325 (65 FE) MG tablet Take 325 mg by mouth 2 (two) times daily.       . furosemide (LASIX) 40 MG tablet Take 1 tablet (40 mg total) by mouth 2 (two) times daily.  30 tablet    . gabapentin (NEURONTIN) 300 MG capsule Take 1 capsule (300 mg total) by mouth 4 (four) times daily.  360 capsule  2  . methocarbamol (ROBAXIN) 750 MG tablet Take 750 mg by mouth 2 (two) times daily.      . Multiple Vitamins-Minerals (MULTIVITAMIN,TX-MINERALS) tablet Take 1 tablet by mouth daily.        Marland Kitchen oxyCODONE  (OXYCONTIN) 10 MG 12 hr tablet Take 10 mg by mouth 2 (two) times daily. Fill on or After12/08/2012  60 tablet  0  . Oxycodone HCl 10 MG TABS Take 1 tablet (10 mg total) by mouth every 4 (four) hours as needed. Breakthrough pain.  180 tablet  0  . potassium chloride (K-DUR) 10 MEQ tablet Take 1 tablet (10 mEq total) by mouth 2 (two) times daily.      . predniSONE (DELTASONE) 5 MG tablet Take 15 mg by mouth daily. Takes 1(5mg ) tablet with 1(10mg ) tablet for total dose 15mg       . simvastatin (ZOCOR) 5 MG tablet Take 1 tablet (5 mg total) by mouth at bedtime.  90 tablet  3      Review of Systems System review is negative for any constitutional, cardiac, pulmonary, GI or neuro symptoms or complaints other than as described in the HPI.    Objective:   Physical Exam Filed Vitals:   09/25/12 1334  BP: 116/72  Pulse: 98  Temp: 98.3 F (36.8 C)  Resp: 10   Gen'l- overweight whtie woman in no acute distress HEENT - mild proptosis, C&S clear, PERRLA. Cor - 2+ radial pulse, RRR w/o murumur Pulm - normal respirations, CTAP Ext - bilateral Lower extremity edema with chronic erythema. Tender to palpation both legs. No ulcers. Some minor superficial desquamation.        Assessment & Plan:

## 2012-09-25 NOTE — Patient Instructions (Addendum)
See full office notes. No changes in medical regimen except to use ACE wraps for control of peripheral edema.  Call me for problems. Good luck with Dr. Harl Bowie - he is an excellent and very well respected surgeon in this region.

## 2012-09-25 NOTE — Assessment & Plan Note (Signed)
Continues to have significant pain from lumbar disk disease. Is to see Dr. Carie Caddy and to consider surgery thereafter.

## 2012-09-25 NOTE — Assessment & Plan Note (Signed)
Has been stable

## 2012-09-25 NOTE — Assessment & Plan Note (Signed)
BP Readings from Last 3 Encounters:  09/25/12 116/72  09/05/12 127/66  08/28/12 120/70   Very good control on present medications.

## 2012-09-25 NOTE — Assessment & Plan Note (Signed)
Diet managed w/o medications. \ Lab Results  Component Value Date   HGBA1C 5.9* 12/14/2011

## 2012-09-26 ENCOUNTER — Telehealth: Payer: Self-pay | Admitting: Internal Medicine

## 2012-09-26 DIAGNOSIS — L02419 Cutaneous abscess of limb, unspecified: Secondary | ICD-10-CM | POA: Diagnosis not present

## 2012-09-26 DIAGNOSIS — N189 Chronic kidney disease, unspecified: Secondary | ICD-10-CM | POA: Diagnosis not present

## 2012-09-26 DIAGNOSIS — M545 Low back pain: Secondary | ICD-10-CM | POA: Diagnosis not present

## 2012-09-26 DIAGNOSIS — I10 Essential (primary) hypertension: Secondary | ICD-10-CM | POA: Diagnosis not present

## 2012-09-26 NOTE — Telephone Encounter (Signed)
Pt requesting test results before the weekend. Please advise.

## 2012-09-26 NOTE — Telephone Encounter (Signed)
Patient is calling get her test results from her visit yesterday before the weekend

## 2012-09-27 NOTE — Telephone Encounter (Signed)
Tried calling patient [name] hr 09/27/12 - left msg on phone that attempt was made to call. Will need to call Monday with lab results.

## 2012-09-29 ENCOUNTER — Telehealth: Payer: Self-pay | Admitting: Internal Medicine

## 2012-09-29 NOTE — Telephone Encounter (Signed)
I personally called her and gave her the results of her ESR = 30 and she was going to call Dr. [name]

## 2012-09-29 NOTE — Telephone Encounter (Signed)
Patient calling back for lab results °

## 2012-09-29 NOTE — Telephone Encounter (Signed)
Pt called requesting lab results. Please advise.  

## 2012-10-01 DIAGNOSIS — Z981 Arthrodesis status: Secondary | ICD-10-CM | POA: Diagnosis not present

## 2012-10-01 DIAGNOSIS — M8448XA Pathological fracture, other site, initial encounter for fracture: Secondary | ICD-10-CM | POA: Diagnosis not present

## 2012-10-07 DIAGNOSIS — M5137 Other intervertebral disc degeneration, lumbosacral region: Secondary | ICD-10-CM | POA: Diagnosis not present

## 2012-10-07 DIAGNOSIS — L03119 Cellulitis of unspecified part of limb: Secondary | ICD-10-CM | POA: Diagnosis not present

## 2012-10-07 DIAGNOSIS — M51379 Other intervertebral disc degeneration, lumbosacral region without mention of lumbar back pain or lower extremity pain: Secondary | ICD-10-CM | POA: Diagnosis present

## 2012-10-07 DIAGNOSIS — M4120 Other idiopathic scoliosis, site unspecified: Secondary | ICD-10-CM | POA: Diagnosis not present

## 2012-10-07 DIAGNOSIS — R6889 Other general symptoms and signs: Secondary | ICD-10-CM | POA: Diagnosis not present

## 2012-10-07 DIAGNOSIS — R52 Pain, unspecified: Secondary | ICD-10-CM | POA: Diagnosis not present

## 2012-10-07 DIAGNOSIS — M810 Age-related osteoporosis without current pathological fracture: Secondary | ICD-10-CM | POA: Diagnosis not present

## 2012-10-07 DIAGNOSIS — I517 Cardiomegaly: Secondary | ICD-10-CM | POA: Diagnosis not present

## 2012-10-07 DIAGNOSIS — Z8673 Personal history of transient ischemic attack (TIA), and cerebral infarction without residual deficits: Secondary | ICD-10-CM | POA: Diagnosis not present

## 2012-10-07 DIAGNOSIS — I10 Essential (primary) hypertension: Secondary | ICD-10-CM | POA: Diagnosis present

## 2012-10-07 DIAGNOSIS — M47817 Spondylosis without myelopathy or radiculopathy, lumbosacral region: Secondary | ICD-10-CM | POA: Diagnosis present

## 2012-10-07 DIAGNOSIS — M6281 Muscle weakness (generalized): Secondary | ICD-10-CM | POA: Diagnosis not present

## 2012-10-07 DIAGNOSIS — R279 Unspecified lack of coordination: Secondary | ICD-10-CM | POA: Diagnosis not present

## 2012-10-07 DIAGNOSIS — Z981 Arthrodesis status: Secondary | ICD-10-CM | POA: Diagnosis not present

## 2012-10-07 DIAGNOSIS — M545 Low back pain: Secondary | ICD-10-CM | POA: Diagnosis not present

## 2012-10-07 DIAGNOSIS — M8448XA Pathological fracture, other site, initial encounter for fracture: Secondary | ICD-10-CM | POA: Diagnosis not present

## 2012-10-07 DIAGNOSIS — K219 Gastro-esophageal reflux disease without esophagitis: Secondary | ICD-10-CM | POA: Diagnosis present

## 2012-10-07 DIAGNOSIS — E785 Hyperlipidemia, unspecified: Secondary | ICD-10-CM | POA: Diagnosis present

## 2012-10-07 DIAGNOSIS — M316 Other giant cell arteritis: Secondary | ICD-10-CM | POA: Diagnosis present

## 2012-10-07 DIAGNOSIS — Z4789 Encounter for other orthopedic aftercare: Secondary | ICD-10-CM | POA: Diagnosis not present

## 2012-10-07 DIAGNOSIS — R262 Difficulty in walking, not elsewhere classified: Secondary | ICD-10-CM | POA: Diagnosis not present

## 2012-10-09 DIAGNOSIS — Z9889 Other specified postprocedural states: Secondary | ICD-10-CM

## 2012-10-09 HISTORY — DX: Other specified postprocedural states: Z98.890

## 2012-10-12 DIAGNOSIS — M47817 Spondylosis without myelopathy or radiculopathy, lumbosacral region: Secondary | ICD-10-CM | POA: Diagnosis not present

## 2012-10-12 DIAGNOSIS — M545 Low back pain: Secondary | ICD-10-CM | POA: Diagnosis not present

## 2012-10-12 DIAGNOSIS — R262 Difficulty in walking, not elsewhere classified: Secondary | ICD-10-CM | POA: Diagnosis not present

## 2012-10-12 DIAGNOSIS — D509 Iron deficiency anemia, unspecified: Secondary | ICD-10-CM | POA: Diagnosis not present

## 2012-10-12 DIAGNOSIS — Z4789 Encounter for other orthopedic aftercare: Secondary | ICD-10-CM | POA: Diagnosis not present

## 2012-10-12 DIAGNOSIS — I10 Essential (primary) hypertension: Secondary | ICD-10-CM | POA: Diagnosis not present

## 2012-10-12 DIAGNOSIS — L02419 Cutaneous abscess of limb, unspecified: Secondary | ICD-10-CM | POA: Diagnosis not present

## 2012-10-12 DIAGNOSIS — Z981 Arthrodesis status: Secondary | ICD-10-CM | POA: Diagnosis not present

## 2012-10-12 DIAGNOSIS — N2581 Secondary hyperparathyroidism of renal origin: Secondary | ICD-10-CM | POA: Diagnosis not present

## 2012-10-12 DIAGNOSIS — R52 Pain, unspecified: Secondary | ICD-10-CM | POA: Diagnosis not present

## 2012-10-12 DIAGNOSIS — M6281 Muscle weakness (generalized): Secondary | ICD-10-CM | POA: Diagnosis not present

## 2012-10-12 DIAGNOSIS — Z79899 Other long term (current) drug therapy: Secondary | ICD-10-CM | POA: Diagnosis not present

## 2012-10-12 DIAGNOSIS — R6889 Other general symptoms and signs: Secondary | ICD-10-CM | POA: Diagnosis not present

## 2012-10-12 DIAGNOSIS — K219 Gastro-esophageal reflux disease without esophagitis: Secondary | ICD-10-CM | POA: Diagnosis not present

## 2012-10-12 DIAGNOSIS — N189 Chronic kidney disease, unspecified: Secondary | ICD-10-CM | POA: Diagnosis not present

## 2012-10-12 DIAGNOSIS — M810 Age-related osteoporosis without current pathological fracture: Secondary | ICD-10-CM | POA: Diagnosis not present

## 2012-10-12 DIAGNOSIS — D649 Anemia, unspecified: Secondary | ICD-10-CM | POA: Diagnosis not present

## 2012-10-12 DIAGNOSIS — R279 Unspecified lack of coordination: Secondary | ICD-10-CM | POA: Diagnosis not present

## 2012-10-20 DIAGNOSIS — M545 Low back pain: Secondary | ICD-10-CM | POA: Diagnosis not present

## 2012-10-20 DIAGNOSIS — N189 Chronic kidney disease, unspecified: Secondary | ICD-10-CM | POA: Diagnosis not present

## 2012-10-20 DIAGNOSIS — L02419 Cutaneous abscess of limb, unspecified: Secondary | ICD-10-CM | POA: Diagnosis not present

## 2012-10-20 DIAGNOSIS — I10 Essential (primary) hypertension: Secondary | ICD-10-CM | POA: Diagnosis not present

## 2012-10-23 DIAGNOSIS — M545 Low back pain: Secondary | ICD-10-CM | POA: Diagnosis not present

## 2012-10-23 DIAGNOSIS — I10 Essential (primary) hypertension: Secondary | ICD-10-CM | POA: Diagnosis not present

## 2012-10-23 DIAGNOSIS — N189 Chronic kidney disease, unspecified: Secondary | ICD-10-CM | POA: Diagnosis not present

## 2012-10-23 DIAGNOSIS — L02419 Cutaneous abscess of limb, unspecified: Secondary | ICD-10-CM | POA: Diagnosis not present

## 2012-10-29 DIAGNOSIS — I10 Essential (primary) hypertension: Secondary | ICD-10-CM | POA: Diagnosis not present

## 2012-10-29 DIAGNOSIS — D509 Iron deficiency anemia, unspecified: Secondary | ICD-10-CM | POA: Diagnosis not present

## 2012-10-29 DIAGNOSIS — N2581 Secondary hyperparathyroidism of renal origin: Secondary | ICD-10-CM | POA: Diagnosis not present

## 2012-10-31 DIAGNOSIS — M545 Low back pain: Secondary | ICD-10-CM | POA: Diagnosis not present

## 2012-10-31 DIAGNOSIS — I10 Essential (primary) hypertension: Secondary | ICD-10-CM | POA: Diagnosis not present

## 2012-10-31 DIAGNOSIS — L03119 Cellulitis of unspecified part of limb: Secondary | ICD-10-CM | POA: Diagnosis not present

## 2012-10-31 DIAGNOSIS — N189 Chronic kidney disease, unspecified: Secondary | ICD-10-CM | POA: Diagnosis not present

## 2012-11-16 DIAGNOSIS — R609 Edema, unspecified: Secondary | ICD-10-CM | POA: Diagnosis not present

## 2012-11-16 DIAGNOSIS — Z4789 Encounter for other orthopedic aftercare: Secondary | ICD-10-CM | POA: Diagnosis not present

## 2012-11-16 DIAGNOSIS — R269 Unspecified abnormalities of gait and mobility: Secondary | ICD-10-CM | POA: Diagnosis not present

## 2012-11-16 DIAGNOSIS — D649 Anemia, unspecified: Secondary | ICD-10-CM | POA: Diagnosis not present

## 2012-11-16 DIAGNOSIS — M48061 Spinal stenosis, lumbar region without neurogenic claudication: Secondary | ICD-10-CM | POA: Diagnosis not present

## 2012-11-17 DIAGNOSIS — M48061 Spinal stenosis, lumbar region without neurogenic claudication: Secondary | ICD-10-CM | POA: Diagnosis not present

## 2012-11-17 DIAGNOSIS — D649 Anemia, unspecified: Secondary | ICD-10-CM | POA: Diagnosis not present

## 2012-11-17 DIAGNOSIS — R609 Edema, unspecified: Secondary | ICD-10-CM | POA: Diagnosis not present

## 2012-11-17 DIAGNOSIS — R269 Unspecified abnormalities of gait and mobility: Secondary | ICD-10-CM | POA: Diagnosis not present

## 2012-11-17 DIAGNOSIS — Z4789 Encounter for other orthopedic aftercare: Secondary | ICD-10-CM | POA: Diagnosis not present

## 2012-11-18 ENCOUNTER — Telehealth: Payer: Self-pay | Admitting: Internal Medicine

## 2012-11-18 NOTE — Telephone Encounter (Signed)
Caller: [name]/Patient; Phone: [phone]; Reason for Call: Patient returning call from [facility] in office.  During conversation with RN, [name] called patient back; caller disconnected from call.  Krs/can

## 2012-11-19 DIAGNOSIS — R269 Unspecified abnormalities of gait and mobility: Secondary | ICD-10-CM | POA: Diagnosis not present

## 2012-11-19 DIAGNOSIS — R609 Edema, unspecified: Secondary | ICD-10-CM | POA: Diagnosis not present

## 2012-11-20 ENCOUNTER — Ambulatory Visit (INDEPENDENT_AMBULATORY_CARE_PROVIDER_SITE_OTHER): Payer: Medicare Other | Admitting: *Deleted

## 2012-11-20 DIAGNOSIS — M810 Age-related osteoporosis without current pathological fracture: Secondary | ICD-10-CM | POA: Diagnosis not present

## 2012-11-20 MED ORDER — DENOSUMAB 60 MG/ML ~~LOC~~ SOLN
60.0000 mg | Freq: Once | SUBCUTANEOUS | Status: AC
  Administered 2012-11-20: 60 mg via SUBCUTANEOUS

## 2012-11-20 NOTE — Addendum Note (Signed)
Addended by: Ellene Route on: 11/20/2012 11:07 AM   Modules accepted: Orders

## 2012-11-21 DIAGNOSIS — M48061 Spinal stenosis, lumbar region without neurogenic claudication: Secondary | ICD-10-CM | POA: Diagnosis not present

## 2012-11-25 DIAGNOSIS — M48061 Spinal stenosis, lumbar region without neurogenic claudication: Secondary | ICD-10-CM | POA: Diagnosis not present

## 2012-11-25 DIAGNOSIS — R609 Edema, unspecified: Secondary | ICD-10-CM | POA: Diagnosis not present

## 2012-11-26 DIAGNOSIS — M48061 Spinal stenosis, lumbar region without neurogenic claudication: Secondary | ICD-10-CM | POA: Diagnosis not present

## 2012-11-27 ENCOUNTER — Telehealth: Payer: Self-pay | Admitting: Internal Medicine

## 2012-11-27 DIAGNOSIS — IMO0002 Reserved for concepts with insufficient information to code with codable children: Secondary | ICD-10-CM

## 2012-11-27 DIAGNOSIS — M48061 Spinal stenosis, lumbar region without neurogenic claudication: Secondary | ICD-10-CM | POA: Diagnosis not present

## 2012-11-27 DIAGNOSIS — R609 Edema, unspecified: Secondary | ICD-10-CM | POA: Diagnosis not present

## 2012-11-27 NOTE — Telephone Encounter (Signed)
Ok to prepare Rx's 30 day supple, 3 Rx's and after signature can be mailed to patient

## 2012-11-27 NOTE — Telephone Encounter (Signed)
Please read phone note below and advise. 

## 2012-11-27 NOTE — Telephone Encounter (Signed)
Caller: [name]/Patient; Phone: [phone]; Reason for Call: Patient is needing a prescription refill for Oxycontin 10mg  BID, Oxycodone 10mg  Q 4 hrs as needed for pain.  Patient reports that she can come in for an appointment if needed, but she was told that she could call and get a refill written to be picked up.  Please call the patient and let her know if she needs an appointment vs having prescription written.  Thanks.

## 2012-12-01 MED ORDER — OXYCODONE HCL 10 MG PO TB12: 10.0000 mg | ORAL_TABLET | Freq: Two times a day (BID) | ORAL | Status: DC

## 2012-12-01 MED ORDER — OXYCODONE HCL 10 MG PO TABS: 10.0000 mg | ORAL_TABLET | ORAL | Status: DC | PRN

## 2012-12-01 NOTE — Telephone Encounter (Signed)
Please read note below

## 2012-12-01 NOTE — Telephone Encounter (Signed)
Spoke with patient and she only takes the regular Oxycodone 10mg  1-2 times daily and per pt since she has an appt on 12/09/2012 she will wait to pick up refills then, pt has not been released to drive, also Dr.Norins would like to speak to pt about quantity of meds.  ( printed rx's have been destroyed)

## 2012-12-02 DIAGNOSIS — M48061 Spinal stenosis, lumbar region without neurogenic claudication: Secondary | ICD-10-CM | POA: Diagnosis not present

## 2012-12-02 DIAGNOSIS — Z4789 Encounter for other orthopedic aftercare: Secondary | ICD-10-CM | POA: Diagnosis not present

## 2012-12-02 DIAGNOSIS — R609 Edema, unspecified: Secondary | ICD-10-CM | POA: Diagnosis not present

## 2012-12-03 DIAGNOSIS — M48061 Spinal stenosis, lumbar region without neurogenic claudication: Secondary | ICD-10-CM | POA: Diagnosis not present

## 2012-12-05 DIAGNOSIS — M48061 Spinal stenosis, lumbar region without neurogenic claudication: Secondary | ICD-10-CM | POA: Diagnosis not present

## 2012-12-09 ENCOUNTER — Encounter: Payer: Self-pay | Admitting: Internal Medicine

## 2012-12-09 ENCOUNTER — Ambulatory Visit (INDEPENDENT_AMBULATORY_CARE_PROVIDER_SITE_OTHER): Payer: Medicare Other | Admitting: Internal Medicine

## 2012-12-09 VITALS — BP 140/88 | HR 82 | Temp 98.0°F | Resp 16 | Ht 62.0 in | Wt 184.0 lb

## 2012-12-09 DIAGNOSIS — M549 Dorsalgia, unspecified: Secondary | ICD-10-CM | POA: Diagnosis not present

## 2012-12-09 DIAGNOSIS — IMO0002 Reserved for concepts with insufficient information to code with codable children: Secondary | ICD-10-CM | POA: Diagnosis not present

## 2012-12-09 DIAGNOSIS — M316 Other giant cell arteritis: Secondary | ICD-10-CM | POA: Diagnosis not present

## 2012-12-09 DIAGNOSIS — G8929 Other chronic pain: Secondary | ICD-10-CM

## 2012-12-09 DIAGNOSIS — I10 Essential (primary) hypertension: Secondary | ICD-10-CM

## 2012-12-09 MED ORDER — OXYCODONE HCL 10 MG PO TB12: 10.0000 mg | ORAL_TABLET | Freq: Two times a day (BID) | ORAL | Status: DC

## 2012-12-09 MED ORDER — POTASSIUM CHLORIDE ER 10 MEQ PO TBCR: 10.0000 meq | EXTENDED_RELEASE_TABLET | Freq: Two times a day (BID) | ORAL | Status: DC

## 2012-12-09 MED ORDER — OXYCODONE HCL 10 MG PO TABS: 10.0000 mg | ORAL_TABLET | ORAL | Status: DC

## 2012-12-09 MED ORDER — OXYCODONE HCL 10 MG PO TABS: 10.0000 mg | ORAL_TABLET | ORAL | Status: DC | PRN

## 2012-12-09 NOTE — Progress Notes (Signed)
Subjective:     Patient ID: [name], female   DOB: [birth year], 72 y.o.   MRN: [id number]  HPI [name] is a 72 yo woman with an extensive PMHx that presents for follow up after back surgery (ruptured disc and vertebral fusion) in January, medication refills, and has a current complaint of LUQ abdominal pain.  She currently takes scheduled extended release oxycodone 2x's daily and regular oxycodone for breakthrough pain (1-2 daily) with good control.  She denies constipation.  She is at home now and doing home PT with good progress, she is still using a walker to get around.    With regard to her abdominal pain she describes it as an aching throbbing pain that is worse when lying down and turning her torso to the left.  It is intermittent and relieved with a heating pad, it does not hinder her activities but is annoying.  She denies NSAID use, the pain is not related to eating, and really denies any associated symptoms with the pain.  Past Medical History  Diagnosis Date  . History of breast cancer   . Osteoporosis   . Idiopathic pulmonary fibrosis   . Bronchiectasis   . Vitamin D deficiency   . Hyperlipidemia   . Allergic rhinitis   . GERD (gastroesophageal reflux disease)   . Left knee DJD   . Anxiety   . Migraine   . Diverticulosis of colon   . Seizure disorder   . Temporal arteritis     Right eye blind, on steroids per [facility]/ Dr [name]  . CVA (cerebral infarction) 10/2007    Right thalamic   . PMR (polymyalgia rheumatica)   . Anemia   . Gait disorder   . Peripheral neuropathy   . HTN (hypertension)   . Type II or unspecified type diabetes mellitus without mention of complication, not stated as uncontrolled     2nd to steriods  . Renal insufficiency   . Previous back surgery - ruptured disc repair and spinal fusion 10/09/12   Past Surgical History  Procedure Laterality Date  . Mastectomy    . Cholecystectomy    . Total knee arthroplasty      Left  .  Tonsillectomy    . Cataract extraction      OS - Summer 2010  . Lumbar fusion  04/2010    W/Mechanical fixation - [facility]  . Spinal fusion  07/2010    T10-L2 interbody fusion / [facility]  . Colonoscopy  12/15/2011    Procedure: COLONOSCOPY;  Surgeon: [name], MD;  Location: [facility];  Service: Endoscopy;  Laterality: N/A;   Family History  Problem Relation Age of Onset  . Brain cancer Mother   . Hypertension Mother   . Dementia Father   . Hypertension Father   . Breast cancer Sister    History   Social History  . Marital Status: Widowed    Spouse Name: N/A    Number of Children: 1  . Years of Education: N/A   Occupational History  . Land    Social History Main Topics  . Smoking status: Never Smoker   . Smokeless tobacco: Never Used  . Alcohol Use: No     Comment: heavy drinker until 1995 - sobriety with AA  . Drug Use: No  . Sexually Active: Not Currently   Other Topics Concern  . Not on file   Social History Narrative  Married - husband invalid with stroke - widowed in 2009. 1 chil. retired - [employer]. Lives alone. ACP - not formerly discussed - wishes to be a full code.             Current Outpatient Prescriptions on File Prior to Visit  Medication Sig Dispense Refill  . aspirin 81 MG tablet Take 81 mg by mouth. Twice per week on wed and sunday      . calcium carbonate (OS-CAL) 600 MG TABS Take 600 mg by mouth 2 (two) times daily with a meal.      . cholecalciferol (VITAMIN D) 1000 UNITS tablet Take 1,000 Units by mouth daily.        Marland Kitchen denosumab (PROLIA) 60 MG/ML SOLN Inject 60 mg into the skin every 6 (six) months.        . ferrous sulfate 325 (65 FE) MG tablet Take 325 mg by mouth 2 (two) times daily.       . furosemide (LASIX) 40 MG tablet Take 1 tablet (40 mg total) by mouth 2 (two) times daily.  30 tablet    . gabapentin (NEURONTIN) 300 MG capsule  Take 1 capsule (300 mg total) by mouth 4 (four) times daily.  360 capsule  2  . methocarbamol (ROBAXIN) 750 MG tablet Take 750 mg by mouth 2 (two) times daily.      . Multiple Vitamins-Minerals (MULTIVITAMIN,TX-MINERALS) tablet Take 1 tablet by mouth daily.        Marland Kitchen oxyCODONE (OXYCONTIN) 10 MG 12 hr tablet Take 1 tablet (10 mg total) by mouth 2 (two) times daily.  60 tablet  0  . oxyCODONE (OXYCONTIN) 10 MG 12 hr tablet Take 1 tablet (10 mg total) by mouth every 12 (twelve) hours. Fill on or after 01/01/2013  60 tablet  0  . oxyCODONE (OXYCONTIN) 10 MG 12 hr tablet Take 1 tablet (10 mg total) by mouth every 12 (twelve) hours. Fill on or after 01/31/2013  60 tablet  0  . Oxycodone HCl 10 MG TABS Take 1 tablet (10 mg total) by mouth every 4 (four) hours as needed. Breakthrough pain.  180 tablet  0  . Oxycodone HCl 10 MG TABS Take 1 tablet (10 mg total) by mouth every 4 (four) hours as needed (breakthru pain). Fill on or after 01/01/2013  180 tablet  0  . Oxycodone HCl 10 MG TABS Take 1 tablet (10 mg total) by mouth every 4 (four) hours as needed. Fill on or after 01/31/2013  180 tablet  0  . potassium chloride (K-DUR) 10 MEQ tablet Take 1 tablet (10 mEq total) by mouth 2 (two) times daily.      . predniSONE (DELTASONE) 5 MG tablet Take 15 mg by mouth daily. Takes 1(5mg ) tablet with 1(10mg ) tablet for total dose 15mg       . simvastatin (ZOCOR) 5 MG tablet Take 1 tablet (5 mg total) by mouth at bedtime.  90 tablet  3   No current facility-administered medications on file prior to visit.     Review of Systems  Constitutional: Negative for fever and fatigue.  Eyes: Positive for visual disturbance (blind in right eye (chronic issue)).  Respiratory: Negative for cough, shortness of breath and wheezing.   Cardiovascular: Positive for leg swelling. Negative for chest pain.  Gastrointestinal: Positive for nausea (comes with the pain) and abdominal pain (LUQ). Negative for diarrhea and constipation.   Musculoskeletal: Positive for back pain.  Objective:   Physical Exam  Vitals reviewed. Constitutional: She appears well-developed and well-nourished. No distress.  HENT:  Head: Normocephalic and atraumatic.  Eyes: EOM are normal.  Neck: No JVD present.  Cardiovascular: Normal rate and regular rhythm.   Murmur (I/VI systolic left sternal border) heard. Pulmonary/Chest: Effort normal. She has no rales (bilateral bases). She exhibits no tenderness.  Abdominal: Soft. Bowel sounds are normal. She exhibits no distension. There is no tenderness.  Musculoskeletal: She exhibits edema (bilateral lower extremities).  Pt is unable to step up onto exam table due to pain/weakness.  Physical exam was performed with pt in a chair.  Lymphadenopathy:    She has no cervical adenopathy.  Neurological: She is alert.  Skin: Skin is warm and dry.  Skin of legs is dry and shows a purple color  Psychiatric: She has a normal mood and affect.       Assessment/Plan:   [name] was interviewed and examined by me personally. Reviewed all meds and authorized refills. Agree with assessment and plan per Mr. Clydene Laming, [place]

## 2012-12-09 NOTE — Assessment & Plan Note (Addendum)
Assessment: Pt's pain is well controlled on current regimen (Oxycontin 2x's daily with oxycodone for breakthrough pain). Ms. Cruver continue to express a desire to taper down and off narcotics when she feels more stable.  Plan: - refilled narcotic medications

## 2012-12-09 NOTE — Assessment & Plan Note (Signed)
Assessment: Today's BP 140/88, nephrologist has decreased her amlodipine and started her on losartan Plan: - follow your nephrologist's instructions - Losartan's MOA discussed - follow up with nephrologist for lab work (K+ and kidney function)

## 2012-12-09 NOTE — Patient Instructions (Addendum)
1. Back problems - sounds like surgery went real well. Yeah! Plan - keep up the rehab  2. Chronic pain management - ok to continue the oxycontin and the oxycodone for breakthrough - new Rx's today. Plan As you get better we can reduce the pain medications  3. Blood pressure - decreasing the amlodipine may improve the periperal swelling. Adding losartan will help keep the BP down and this is a good medication. Plan Start losartan  Follow up lab with Dr. Marval Regal in several weeks,   Continue potassium for now  4. Temporal arteritis -  Will check sed rate at Dr. Elissa Hefty office.

## 2012-12-10 DIAGNOSIS — D649 Anemia, unspecified: Secondary | ICD-10-CM | POA: Diagnosis not present

## 2012-12-10 DIAGNOSIS — R269 Unspecified abnormalities of gait and mobility: Secondary | ICD-10-CM | POA: Diagnosis not present

## 2012-12-10 DIAGNOSIS — M48061 Spinal stenosis, lumbar region without neurogenic claudication: Secondary | ICD-10-CM | POA: Diagnosis not present

## 2012-12-10 DIAGNOSIS — Z4789 Encounter for other orthopedic aftercare: Secondary | ICD-10-CM | POA: Diagnosis not present

## 2012-12-10 DIAGNOSIS — R609 Edema, unspecified: Secondary | ICD-10-CM | POA: Diagnosis not present

## 2012-12-10 NOTE — Assessment & Plan Note (Signed)
Followed by Dr. Floyde Parkins.  Plan ESR today with information to be forwarded to Dr. [name] re: any medication adjustment

## 2012-12-10 NOTE — Assessment & Plan Note (Signed)
Did well with surgery and has been making a good recovery. She is ambulating using a rolling walker. She is now back at home.  Plan Follow up per Dr. Harl Bowie.

## 2012-12-11 DIAGNOSIS — Z961 Presence of intraocular lens: Secondary | ICD-10-CM | POA: Diagnosis not present

## 2012-12-11 DIAGNOSIS — H4720 Unspecified optic atrophy: Secondary | ICD-10-CM | POA: Diagnosis not present

## 2012-12-11 DIAGNOSIS — M316 Other giant cell arteritis: Secondary | ICD-10-CM | POA: Diagnosis not present

## 2012-12-11 DIAGNOSIS — H5010 Unspecified exotropia: Secondary | ICD-10-CM | POA: Diagnosis not present

## 2012-12-12 DIAGNOSIS — R609 Edema, unspecified: Secondary | ICD-10-CM | POA: Diagnosis not present

## 2012-12-12 DIAGNOSIS — Z4789 Encounter for other orthopedic aftercare: Secondary | ICD-10-CM | POA: Diagnosis not present

## 2012-12-12 DIAGNOSIS — M48061 Spinal stenosis, lumbar region without neurogenic claudication: Secondary | ICD-10-CM | POA: Diagnosis not present

## 2012-12-12 DIAGNOSIS — R269 Unspecified abnormalities of gait and mobility: Secondary | ICD-10-CM | POA: Diagnosis not present

## 2012-12-12 DIAGNOSIS — D649 Anemia, unspecified: Secondary | ICD-10-CM | POA: Diagnosis not present

## 2012-12-15 DIAGNOSIS — R609 Edema, unspecified: Secondary | ICD-10-CM | POA: Diagnosis not present

## 2012-12-15 DIAGNOSIS — D649 Anemia, unspecified: Secondary | ICD-10-CM | POA: Diagnosis not present

## 2012-12-15 DIAGNOSIS — R269 Unspecified abnormalities of gait and mobility: Secondary | ICD-10-CM | POA: Diagnosis not present

## 2012-12-15 DIAGNOSIS — Z4789 Encounter for other orthopedic aftercare: Secondary | ICD-10-CM | POA: Diagnosis not present

## 2012-12-15 DIAGNOSIS — M48061 Spinal stenosis, lumbar region without neurogenic claudication: Secondary | ICD-10-CM | POA: Diagnosis not present

## 2012-12-17 DIAGNOSIS — D649 Anemia, unspecified: Secondary | ICD-10-CM | POA: Diagnosis not present

## 2012-12-17 DIAGNOSIS — Z4789 Encounter for other orthopedic aftercare: Secondary | ICD-10-CM | POA: Diagnosis not present

## 2012-12-17 DIAGNOSIS — M48061 Spinal stenosis, lumbar region without neurogenic claudication: Secondary | ICD-10-CM | POA: Diagnosis not present

## 2012-12-17 DIAGNOSIS — R609 Edema, unspecified: Secondary | ICD-10-CM | POA: Diagnosis not present

## 2012-12-17 DIAGNOSIS — R269 Unspecified abnormalities of gait and mobility: Secondary | ICD-10-CM | POA: Diagnosis not present

## 2012-12-18 ENCOUNTER — Telehealth: Payer: Self-pay | Admitting: Internal Medicine

## 2012-12-18 MED ORDER — POTASSIUM CHLORIDE ER 10 MEQ PO TBCR: 10.0000 meq | EXTENDED_RELEASE_TABLET | Freq: Two times a day (BID) | ORAL | Status: DC

## 2012-12-18 NOTE — Telephone Encounter (Signed)
Requesting Potassium.  [facility] is not at [facility]  [facility] [address].  Call her when it's called in at [phone].

## 2012-12-22 DIAGNOSIS — D649 Anemia, unspecified: Secondary | ICD-10-CM | POA: Diagnosis not present

## 2012-12-22 DIAGNOSIS — Z4789 Encounter for other orthopedic aftercare: Secondary | ICD-10-CM | POA: Diagnosis not present

## 2012-12-22 DIAGNOSIS — R609 Edema, unspecified: Secondary | ICD-10-CM | POA: Diagnosis not present

## 2012-12-22 DIAGNOSIS — M48061 Spinal stenosis, lumbar region without neurogenic claudication: Secondary | ICD-10-CM | POA: Diagnosis not present

## 2012-12-22 DIAGNOSIS — R269 Unspecified abnormalities of gait and mobility: Secondary | ICD-10-CM | POA: Diagnosis not present

## 2012-12-23 DIAGNOSIS — M316 Other giant cell arteritis: Secondary | ICD-10-CM | POA: Diagnosis not present

## 2012-12-23 DIAGNOSIS — I120 Hypertensive chronic kidney disease with stage 5 chronic kidney disease or end stage renal disease: Secondary | ICD-10-CM | POA: Diagnosis not present

## 2012-12-24 DIAGNOSIS — R269 Unspecified abnormalities of gait and mobility: Secondary | ICD-10-CM | POA: Diagnosis not present

## 2012-12-24 DIAGNOSIS — D649 Anemia, unspecified: Secondary | ICD-10-CM | POA: Diagnosis not present

## 2012-12-24 DIAGNOSIS — R609 Edema, unspecified: Secondary | ICD-10-CM | POA: Diagnosis not present

## 2012-12-24 DIAGNOSIS — M48061 Spinal stenosis, lumbar region without neurogenic claudication: Secondary | ICD-10-CM | POA: Diagnosis not present

## 2012-12-24 DIAGNOSIS — Z4789 Encounter for other orthopedic aftercare: Secondary | ICD-10-CM | POA: Diagnosis not present

## 2012-12-25 DIAGNOSIS — M48061 Spinal stenosis, lumbar region without neurogenic claudication: Secondary | ICD-10-CM | POA: Diagnosis not present

## 2012-12-25 DIAGNOSIS — Z4789 Encounter for other orthopedic aftercare: Secondary | ICD-10-CM | POA: Diagnosis not present

## 2012-12-25 DIAGNOSIS — R269 Unspecified abnormalities of gait and mobility: Secondary | ICD-10-CM | POA: Diagnosis not present

## 2012-12-25 DIAGNOSIS — D649 Anemia, unspecified: Secondary | ICD-10-CM | POA: Diagnosis not present

## 2012-12-25 DIAGNOSIS — R609 Edema, unspecified: Secondary | ICD-10-CM | POA: Diagnosis not present

## 2012-12-26 DIAGNOSIS — Z4789 Encounter for other orthopedic aftercare: Secondary | ICD-10-CM | POA: Diagnosis not present

## 2012-12-26 DIAGNOSIS — D649 Anemia, unspecified: Secondary | ICD-10-CM | POA: Diagnosis not present

## 2012-12-26 DIAGNOSIS — R609 Edema, unspecified: Secondary | ICD-10-CM | POA: Diagnosis not present

## 2012-12-26 DIAGNOSIS — R269 Unspecified abnormalities of gait and mobility: Secondary | ICD-10-CM | POA: Diagnosis not present

## 2012-12-26 DIAGNOSIS — M48061 Spinal stenosis, lumbar region without neurogenic claudication: Secondary | ICD-10-CM | POA: Diagnosis not present

## 2012-12-29 ENCOUNTER — Other Ambulatory Visit: Payer: Self-pay

## 2012-12-29 DIAGNOSIS — Z4789 Encounter for other orthopedic aftercare: Secondary | ICD-10-CM | POA: Diagnosis not present

## 2012-12-29 DIAGNOSIS — D649 Anemia, unspecified: Secondary | ICD-10-CM | POA: Diagnosis not present

## 2012-12-29 DIAGNOSIS — R609 Edema, unspecified: Secondary | ICD-10-CM | POA: Diagnosis not present

## 2012-12-29 DIAGNOSIS — R269 Unspecified abnormalities of gait and mobility: Secondary | ICD-10-CM | POA: Diagnosis not present

## 2012-12-29 DIAGNOSIS — M48061 Spinal stenosis, lumbar region without neurogenic claudication: Secondary | ICD-10-CM | POA: Diagnosis not present

## 2012-12-29 MED ORDER — PREDNISONE 5 MG PO TABS: 10.0000 mg | ORAL_TABLET | Freq: Every day | ORAL | Status: DC

## 2012-12-29 MED ORDER — POTASSIUM CHLORIDE ER 10 MEQ PO TBCR: 10.0000 meq | EXTENDED_RELEASE_TABLET | Freq: Two times a day (BID) | ORAL | Status: DC

## 2012-12-29 MED ORDER — LOSARTAN POTASSIUM 50 MG PO TABS: 50.0000 mg | ORAL_TABLET | Freq: Every day | ORAL | Status: DC

## 2012-12-31 DIAGNOSIS — R609 Edema, unspecified: Secondary | ICD-10-CM | POA: Diagnosis not present

## 2012-12-31 DIAGNOSIS — D649 Anemia, unspecified: Secondary | ICD-10-CM | POA: Diagnosis not present

## 2012-12-31 DIAGNOSIS — Z4789 Encounter for other orthopedic aftercare: Secondary | ICD-10-CM | POA: Diagnosis not present

## 2012-12-31 DIAGNOSIS — R269 Unspecified abnormalities of gait and mobility: Secondary | ICD-10-CM | POA: Diagnosis not present

## 2012-12-31 DIAGNOSIS — M48061 Spinal stenosis, lumbar region without neurogenic claudication: Secondary | ICD-10-CM | POA: Diagnosis not present

## 2013-01-02 DIAGNOSIS — D649 Anemia, unspecified: Secondary | ICD-10-CM | POA: Diagnosis not present

## 2013-01-02 DIAGNOSIS — R609 Edema, unspecified: Secondary | ICD-10-CM | POA: Diagnosis not present

## 2013-01-02 DIAGNOSIS — M48061 Spinal stenosis, lumbar region without neurogenic claudication: Secondary | ICD-10-CM | POA: Diagnosis not present

## 2013-01-02 DIAGNOSIS — Z4789 Encounter for other orthopedic aftercare: Secondary | ICD-10-CM | POA: Diagnosis not present

## 2013-01-02 DIAGNOSIS — R269 Unspecified abnormalities of gait and mobility: Secondary | ICD-10-CM | POA: Diagnosis not present

## 2013-01-05 DIAGNOSIS — Z4789 Encounter for other orthopedic aftercare: Secondary | ICD-10-CM | POA: Diagnosis not present

## 2013-01-05 DIAGNOSIS — D649 Anemia, unspecified: Secondary | ICD-10-CM | POA: Diagnosis not present

## 2013-01-05 DIAGNOSIS — R269 Unspecified abnormalities of gait and mobility: Secondary | ICD-10-CM | POA: Diagnosis not present

## 2013-01-05 DIAGNOSIS — R609 Edema, unspecified: Secondary | ICD-10-CM | POA: Diagnosis not present

## 2013-01-05 DIAGNOSIS — M48061 Spinal stenosis, lumbar region without neurogenic claudication: Secondary | ICD-10-CM | POA: Diagnosis not present

## 2013-01-06 DIAGNOSIS — M48061 Spinal stenosis, lumbar region without neurogenic claudication: Secondary | ICD-10-CM | POA: Diagnosis not present

## 2013-01-06 DIAGNOSIS — Z4789 Encounter for other orthopedic aftercare: Secondary | ICD-10-CM | POA: Diagnosis not present

## 2013-01-06 DIAGNOSIS — D649 Anemia, unspecified: Secondary | ICD-10-CM | POA: Diagnosis not present

## 2013-01-06 DIAGNOSIS — R269 Unspecified abnormalities of gait and mobility: Secondary | ICD-10-CM | POA: Diagnosis not present

## 2013-01-06 DIAGNOSIS — R609 Edema, unspecified: Secondary | ICD-10-CM | POA: Diagnosis not present

## 2013-01-07 ENCOUNTER — Other Ambulatory Visit: Payer: Self-pay

## 2013-01-07 DIAGNOSIS — M47817 Spondylosis without myelopathy or radiculopathy, lumbosacral region: Secondary | ICD-10-CM | POA: Diagnosis not present

## 2013-01-07 DIAGNOSIS — M51379 Other intervertebral disc degeneration, lumbosacral region without mention of lumbar back pain or lower extremity pain: Secondary | ICD-10-CM | POA: Diagnosis not present

## 2013-01-07 DIAGNOSIS — Z4789 Encounter for other orthopedic aftercare: Secondary | ICD-10-CM | POA: Diagnosis not present

## 2013-01-07 DIAGNOSIS — Z981 Arthrodesis status: Secondary | ICD-10-CM | POA: Diagnosis not present

## 2013-01-07 DIAGNOSIS — M5137 Other intervertebral disc degeneration, lumbosacral region: Secondary | ICD-10-CM | POA: Diagnosis not present

## 2013-01-07 MED ORDER — PREDNISONE 5 MG PO TABS: ORAL_TABLET | ORAL | Status: DC

## 2013-01-07 NOTE — Telephone Encounter (Signed)
Re sent prescription for Prednisone with correct instructions.   Patient asked about my chart instructions. I printed them and left a message that I am mailing them today. Pt was also asking about her lab work from January. I left a detailed message(okay to do so by pt) stating Dr Linda Hedges did talk to her about her lab work on 09/29/12, specifically ESR being 30 and pt was to contact Dr [name]. She will call back with any questions/concerns.

## 2013-01-09 DIAGNOSIS — D649 Anemia, unspecified: Secondary | ICD-10-CM | POA: Diagnosis not present

## 2013-01-09 DIAGNOSIS — M48061 Spinal stenosis, lumbar region without neurogenic claudication: Secondary | ICD-10-CM | POA: Diagnosis not present

## 2013-01-09 DIAGNOSIS — R609 Edema, unspecified: Secondary | ICD-10-CM | POA: Diagnosis not present

## 2013-01-09 DIAGNOSIS — R269 Unspecified abnormalities of gait and mobility: Secondary | ICD-10-CM | POA: Diagnosis not present

## 2013-01-09 DIAGNOSIS — Z4789 Encounter for other orthopedic aftercare: Secondary | ICD-10-CM | POA: Diagnosis not present

## 2013-01-12 DIAGNOSIS — M48061 Spinal stenosis, lumbar region without neurogenic claudication: Secondary | ICD-10-CM | POA: Diagnosis not present

## 2013-01-12 DIAGNOSIS — D649 Anemia, unspecified: Secondary | ICD-10-CM | POA: Diagnosis not present

## 2013-01-12 DIAGNOSIS — Z4789 Encounter for other orthopedic aftercare: Secondary | ICD-10-CM | POA: Diagnosis not present

## 2013-01-12 DIAGNOSIS — R269 Unspecified abnormalities of gait and mobility: Secondary | ICD-10-CM | POA: Diagnosis not present

## 2013-01-12 DIAGNOSIS — R609 Edema, unspecified: Secondary | ICD-10-CM | POA: Diagnosis not present

## 2013-01-14 DIAGNOSIS — R269 Unspecified abnormalities of gait and mobility: Secondary | ICD-10-CM | POA: Diagnosis not present

## 2013-01-14 DIAGNOSIS — D649 Anemia, unspecified: Secondary | ICD-10-CM | POA: Diagnosis not present

## 2013-01-14 DIAGNOSIS — Z4789 Encounter for other orthopedic aftercare: Secondary | ICD-10-CM | POA: Diagnosis not present

## 2013-01-14 DIAGNOSIS — R609 Edema, unspecified: Secondary | ICD-10-CM | POA: Diagnosis not present

## 2013-01-14 DIAGNOSIS — M48061 Spinal stenosis, lumbar region without neurogenic claudication: Secondary | ICD-10-CM | POA: Diagnosis not present

## 2013-01-19 DIAGNOSIS — H5710 Ocular pain, unspecified eye: Secondary | ICD-10-CM | POA: Diagnosis not present

## 2013-01-19 DIAGNOSIS — H0019 Chalazion unspecified eye, unspecified eyelid: Secondary | ICD-10-CM | POA: Diagnosis not present

## 2013-01-22 DIAGNOSIS — H0019 Chalazion unspecified eye, unspecified eyelid: Secondary | ICD-10-CM | POA: Diagnosis not present

## 2013-01-22 DIAGNOSIS — H579 Unspecified disorder of eye and adnexa: Secondary | ICD-10-CM | POA: Diagnosis not present

## 2013-02-10 ENCOUNTER — Telehealth: Payer: Self-pay

## 2013-02-10 DIAGNOSIS — N179 Acute kidney failure, unspecified: Secondary | ICD-10-CM | POA: Diagnosis not present

## 2013-02-10 DIAGNOSIS — N2581 Secondary hyperparathyroidism of renal origin: Secondary | ICD-10-CM | POA: Diagnosis not present

## 2013-02-10 DIAGNOSIS — N184 Chronic kidney disease, stage 4 (severe): Secondary | ICD-10-CM | POA: Diagnosis not present

## 2013-02-10 DIAGNOSIS — I10 Essential (primary) hypertension: Secondary | ICD-10-CM | POA: Diagnosis not present

## 2013-02-10 DIAGNOSIS — D509 Iron deficiency anemia, unspecified: Secondary | ICD-10-CM | POA: Diagnosis not present

## 2013-02-10 MED ORDER — AMLODIPINE BESYLATE 5 MG PO TABS: 5.0000 mg | ORAL_TABLET | Freq: Every day | ORAL | Status: DC

## 2013-02-10 NOTE — Telephone Encounter (Signed)
Pt calls requesting a refill of Amlodipine 5 mg be sent to [facility]. She had been getting 10 mg but Dr Ambrose Pancoast with [facility] switched her to 5 mg (med list reflects this) and she had been cutting the pills in half but she states a lot of times they crumble so she just wants 5 mg tabs to be sent in.

## 2013-02-11 ENCOUNTER — Telehealth: Payer: Self-pay

## 2013-02-11 DIAGNOSIS — M316 Other giant cell arteritis: Secondary | ICD-10-CM

## 2013-02-11 NOTE — Telephone Encounter (Signed)
Attempted to notify pt. No answer, could not leave voicemail. Will try again later.

## 2013-02-11 NOTE — Telephone Encounter (Signed)
Ok - order entered into EMR

## 2013-02-11 NOTE — Telephone Encounter (Signed)
Pt calls requesting an order for sed rate. She states she is taking Prednisone. Please advise.

## 2013-02-17 ENCOUNTER — Encounter: Payer: Self-pay | Admitting: Neurology

## 2013-02-17 ENCOUNTER — Other Ambulatory Visit (INDEPENDENT_AMBULATORY_CARE_PROVIDER_SITE_OTHER): Payer: Medicare Other

## 2013-02-17 ENCOUNTER — Ambulatory Visit (INDEPENDENT_AMBULATORY_CARE_PROVIDER_SITE_OTHER): Payer: Medicare Other | Admitting: Neurology

## 2013-02-17 VITALS — BP 109/61 | HR 96 | Ht 63.0 in | Wt 192.0 lb

## 2013-02-17 DIAGNOSIS — G63 Polyneuropathy in diseases classified elsewhere: Secondary | ICD-10-CM

## 2013-02-17 DIAGNOSIS — M316 Other giant cell arteritis: Secondary | ICD-10-CM

## 2013-02-17 DIAGNOSIS — M542 Cervicalgia: Secondary | ICD-10-CM

## 2013-02-17 DIAGNOSIS — R209 Unspecified disturbances of skin sensation: Secondary | ICD-10-CM

## 2013-02-17 HISTORY — DX: Polyneuropathy in diseases classified elsewhere: G63

## 2013-02-17 LAB — SEDIMENTATION RATE: Sed Rate: 19 mm/hr (ref 0–22)

## 2013-02-17 NOTE — Progress Notes (Signed)
Reason for visit: Temporal arteritis  [name] is an 72 y.o. female  History of present illness:  [name] is a 72 year old right-handed white female with a history of temporal arteritis associated with polymyalgia rheumatica. The patient has been on prednisone, and she currently is on 12.5 mg daily. The patient has done well, without any headaches or any changes in vision. The patient mainly complains of problems with her feet with her neuropathy. The patient has had lumbosacral spine surgery within the last several months, and this has improved her back discomfort. The patient is on gabapentin, but she is having some ongoing ankle swelling, and daytime drowsiness on the medication. The patient uses a walker for ambulation, and she denies any recent falls. The patient has a left sided footdrop that was associated with her low back issues. The patient returns for an evaluation.  Past Medical History  Diagnosis Date  . History of breast cancer   . Osteoporosis   . Idiopathic pulmonary fibrosis   . Bronchiectasis   . Vitamin D deficiency   . Hyperlipidemia   . Allergic rhinitis   . GERD (gastroesophageal reflux disease)   . Left knee DJD   . Anxiety   . Migraine   . Diverticulosis of colon   . Seizure disorder   . Temporal arteritis     Right eye blind, on steroids per [facility]/ Dr [name]  . CVA (cerebral infarction) 10/2007    Right thalamic   . PMR (polymyalgia rheumatica)   . Anemia   . Gait disorder   . Peripheral neuropathy   . HTN (hypertension)   . Type II or unspecified type diabetes mellitus without mention of complication, not stated as uncontrolled     2nd to steriods  . Renal insufficiency   . Previous back surgery 10/09/12  . Polyneuropathy in other diseases classified elsewhere 02/17/2013    Past Surgical History  Procedure Laterality Date  . Mastectomy    . Cholecystectomy    . Total knee arthroplasty      Left  . Tonsillectomy    . Cataract  extraction      OS - Summer 2010  . Lumbar fusion  04/2010    W/Mechanical fixation - [facility]  . Spinal fusion  07/2010    T10-L2 interbody fusion / [facility]  . Colonoscopy  12/15/2011    Procedure: COLONOSCOPY;  Surgeon: [name] Craver, MD;  Location: [facility];  Service: Endoscopy;  Laterality: N/A;  . Carpal tunnel release Right     Family History  Problem Relation Age of Onset  . Brain cancer Mother   . Hypertension Mother   . Dementia Father   . Hypertension Father   . Breast cancer Sister   . Multiple sclerosis Sister     Social history:  reports that she has never smoked. She has never used smokeless tobacco. She reports that she does not drink alcohol or use illicit drugs.  Allergies:  Allergies  Allergen Reactions  . Neurontin (Gabapentin)     Medications:  Current Outpatient Prescriptions on File Prior to Visit  Medication Sig Dispense Refill  . aspirin 81 MG tablet Take 81 mg by mouth. Twice per week on wed and sunday      . calcium carbonate (OS-CAL) 600 MG TABS Take 600 mg by mouth 2 (two) times daily with a meal.      . cholecalciferol (VITAMIN D) 1000 UNITS tablet Take 1,000 Units by mouth daily.        Marland Kitchen  denosumab (PROLIA) 60 MG/ML SOLN Inject 60 mg into the skin every 6 (six) months.        . ferrous sulfate 325 (65 FE) MG tablet Take 325 mg by mouth 2 (two) times daily.       . furosemide (LASIX) 40 MG tablet Take 1 tablet (40 mg total) by mouth 2 (two) times daily.  30 tablet    . losartan (COZAAR) 50 MG tablet Take 1 tablet (50 mg total) by mouth daily.  90 tablet  0  . Multiple Vitamins-Minerals (MULTIVITAMIN,TX-MINERALS) tablet Take 1 tablet by mouth daily.        Marland Kitchen oxyCODONE (OXYCONTIN) 10 MG 12 hr tablet Take 1 tablet (10 mg total) by mouth every 12 (twelve) hours. Fill on or after 01/01/2013  60 tablet  0  . potassium chloride (K-DUR) 10 MEQ tablet Take 1 tablet (10 mEq total) by mouth 2 (two) times daily.  180 tablet  0  .  simvastatin (ZOCOR) 5 MG tablet Take 1 tablet (5 mg total) by mouth at bedtime.  90 tablet  3   No current facility-administered medications on file prior to visit.    ROS:  Out of a complete 14 system review of symptoms, the patient complains only of the following symptoms, and all other reviewed systems are negative.  Swelling of the legs Short of breath Urination problems Restless legs    Blood pressure 109/61, pulse 96, height 5\' 3"  (1.6 m), weight 192 lb (87.091 kg).  Physical Exam  General: The patient is alert and cooperative at the time of the examination. The patient is moderately obese.  Skin: 2+ edema at the ankles is noted bilaterally.   Neurologic Exam  Cranial nerves: Facial symmetry is present. Speech is normal, no aphasia or dysarthria is noted. Extraocular movements are full. Visual fields are full with the left eye, highly restricted with the right. Pupils are equal, round, and reactive to light. Discs are flat bilaterally.  Motor: The patient has good strength in all 4 extremities, with the exception of a left sided footdrop.  Coordination: The patient has good finger-nose-finger and heel-to-shin bilaterally.  Gait and station: The patient has a slightly wide-based gait. The patient uses a walker for ambulation. Tandem gait is slightly unsteady. Romberg is negative. No drift is seen.  Reflexes: Deep tendon reflexes are symmetric.   Assessment/Plan:  1. Temporal arteritis  2. Peripheral neuropathy  3. Gait disorder  The patient is having some ongoing issues with numbness and balance. Medications for the peripheral neuropathy do not help numbness, unfortunately. The patient does get some pain benefit with the gabapentin, but she is reporting too much drowsiness. The patient will begin samples of Gralise to see if this helps the drowsiness and still is effective for the pain. The patient will be on 1200 mg daily. If this is helpful, she will call us and we  will call in a prescription. The patient will followup in 6 months. The patient is to have a sedimentation rate drawn this week. We will try to continue to decrease the dosing of the prednisone.  [name] Alexanders MD 02/17/2013 9:22 PM  [facility] [address] [place], [place] [address]  Phone [phone] Fax [phone]

## 2013-02-18 NOTE — Telephone Encounter (Signed)
Patient already had labs drawn

## 2013-02-20 ENCOUNTER — Telehealth: Payer: Self-pay | Admitting: *Deleted

## 2013-02-20 NOTE — Telephone Encounter (Signed)
[facility] updated rx on her amlodipine. Dr. Louanna Raw decrease her to 5 mg tab. Inform pt on 02/10/13 md receive her msg & amlodipine 5 mg was [facility]...Johny Chess

## 2013-03-02 ENCOUNTER — Other Ambulatory Visit: Payer: Self-pay | Admitting: Internal Medicine

## 2013-03-10 ENCOUNTER — Telehealth: Payer: Self-pay

## 2013-03-10 MED ORDER — GABAPENTIN (ONCE-DAILY) 600 MG PO TABS: 1200.0000 mg | ORAL_TABLET | Freq: Every day | ORAL | Status: DC

## 2013-03-10 NOTE — Telephone Encounter (Signed)
Message copied by Yates Decamp on Tue Mar 10, 2013  5:25 PM ------      Message from: [facility], [place]      Created: Tue Mar 10, 2013  3:25 PM      Contact: pt       Pt had some samples of Gabapentin time release and it works great.  She would like to get a Rx for that.  Please call pt and advise. [phone] [facility] is who she uses due to having [employer] for [employer].  You can leave her message if she is not there. ------

## 2013-03-10 NOTE — Telephone Encounter (Signed)
Rx has been added and sent.  I called the patient back.  She is aware.

## 2013-03-23 ENCOUNTER — Ambulatory Visit (INDEPENDENT_AMBULATORY_CARE_PROVIDER_SITE_OTHER): Payer: Medicare Other | Admitting: Internal Medicine

## 2013-03-23 ENCOUNTER — Encounter: Payer: Self-pay | Admitting: Internal Medicine

## 2013-03-23 VITALS — BP 130/78 | HR 82 | Temp 98.1°F | Resp 8 | Ht 62.0 in | Wt 195.4 lb

## 2013-03-23 DIAGNOSIS — IMO0002 Reserved for concepts with insufficient information to code with codable children: Secondary | ICD-10-CM | POA: Diagnosis not present

## 2013-03-23 DIAGNOSIS — I10 Essential (primary) hypertension: Secondary | ICD-10-CM | POA: Diagnosis not present

## 2013-03-23 DIAGNOSIS — E119 Type 2 diabetes mellitus without complications: Secondary | ICD-10-CM | POA: Diagnosis not present

## 2013-03-23 DIAGNOSIS — M316 Other giant cell arteritis: Secondary | ICD-10-CM | POA: Diagnosis not present

## 2013-03-23 MED ORDER — OXYCODONE HCL 10 MG PO TB12: 10.0000 mg | ORAL_TABLET | Freq: Two times a day (BID) | ORAL | Status: DC

## 2013-03-23 MED ORDER — OXYCODONE HCL 10 MG PO TABS: 10.0000 mg | ORAL_TABLET | ORAL | Status: DC | PRN

## 2013-03-23 NOTE — Progress Notes (Signed)
  Subjective:    Patient ID: [name], female    DOB: December 27, 1940, 72 y.o.   MRN: [id number]  HPI [name] presents for follow up and chronic pain management. IN the interval she has seen Dr. [name] and is doing well. She has followed with Dr. Marval Regal - Cr 1.1 range. Her sed rate has been well controlled with last in this office 02/17/13 19. In April with outside lab ESR = 11.   She continues to have left anterior chest wall pain that has been present for months.    Review of Systems     Objective:   Physical Exam Filed Vitals:   03/23/13 1642  BP: 130/78  Pulse: 82  Temp: 98.1 F (36.7 C)  Resp: 8   Wt Readings from Last 3 Encounters:  03/23/13 195 lb 6.4 oz (88.633 kg)  02/17/13 192 lb (87.091 kg)  07/11/12 200 lb (90.719 kg)  Gen'l - WNWD overweight white woman in no distress HEENT - normal with clear C&S Cor 2+ radial RRR Pulm - moving air well, no rales or wheezes MSK - mvoing well using a walker. Has tenderness to palpation intercostal space left anterior chest wall R6-8        Assessment & Plan:

## 2013-03-24 NOTE — Assessment & Plan Note (Signed)
Doing well post -op. She is ambulating with a rolling walker. The level of pain and discomfort is improved. She does continue on oxycodone but is using less breakthrough doses.  Plan Refilled Rx's for 3 months.

## 2013-03-24 NOTE — Assessment & Plan Note (Signed)
BP Readings from Last 3 Encounters:  03/23/13 130/78  02/17/13 109/61  07/11/12 100/87   Good control on her present regimen

## 2013-03-24 NOTE — Assessment & Plan Note (Signed)
Last sed rate in May '14 was 19,  In April '14 it was 11. She reports that she feels as she does when there is a flare of her arteritis but will wait until med-July for her next ESR (order entered).

## 2013-03-24 NOTE — Assessment & Plan Note (Addendum)
Lab Results  Component Value Date   HGBA1C 5.9* 12/14/2011   She does have lab done at her nephrologist office. She will be due for an A1C at her next physical  Plan continue present regiemen

## 2013-03-24 NOTE — Patient Instructions (Addendum)
Medically stable - to return for lab in mid-July.

## 2013-03-31 ENCOUNTER — Telehealth: Payer: Self-pay

## 2013-03-31 NOTE — Telephone Encounter (Signed)
Phone call from [place] [facility] in [place] just needing to verify that the patient is indeed on Oxycontin and Oxycodone. Gave her dosing and instructions for verification.

## 2013-04-10 ENCOUNTER — Other Ambulatory Visit (INDEPENDENT_AMBULATORY_CARE_PROVIDER_SITE_OTHER): Payer: Medicare Other

## 2013-04-10 DIAGNOSIS — Z1231 Encounter for screening mammogram for malignant neoplasm of breast: Secondary | ICD-10-CM | POA: Diagnosis not present

## 2013-04-10 DIAGNOSIS — M316 Other giant cell arteritis: Secondary | ICD-10-CM | POA: Diagnosis not present

## 2013-04-10 LAB — SEDIMENTATION RATE: Sed Rate: 22 mm/hr (ref 0–22)

## 2013-04-16 ENCOUNTER — Telehealth: Payer: Self-pay | Admitting: Internal Medicine

## 2013-04-16 ENCOUNTER — Telehealth: Payer: Self-pay | Admitting: Neurology

## 2013-04-16 MED ORDER — OXYCODONE HCL 10 MG PO TABS: 10.0000 mg | ORAL_TABLET | ORAL | Status: DC | PRN

## 2013-04-16 MED ORDER — GABAPENTIN 300 MG PO CAPS: 300.0000 mg | ORAL_CAPSULE | Freq: Four times a day (QID) | ORAL | Status: DC

## 2013-04-16 NOTE — Telephone Encounter (Signed)
Called patient. The patient indicates that release does not seem to work well for the pain. She'll be switched back to gabapentin taking 300 mg 4 times daily. We may consider adding Cymbalta to this in the future.

## 2013-04-16 NOTE — Telephone Encounter (Signed)
Patient called stating the gralise did not work and she went back to  Gabapentin. She is having a terrible time with the pain in her feet. Please call and advise.

## 2013-04-16 NOTE — Telephone Encounter (Signed)
Pt states rx for oxycodone for was only for 30 tablets, she takes them every 4hrs. Needs enough to last her a month

## 2013-04-16 NOTE — Telephone Encounter (Signed)
Old scripts destroyed. New scripts printed for #180 since she takes every 4 hours and given to patient.

## 2013-04-24 ENCOUNTER — Encounter: Payer: Self-pay | Admitting: Internal Medicine

## 2013-04-28 DIAGNOSIS — I10 Essential (primary) hypertension: Secondary | ICD-10-CM | POA: Diagnosis not present

## 2013-04-28 DIAGNOSIS — N2581 Secondary hyperparathyroidism of renal origin: Secondary | ICD-10-CM | POA: Diagnosis not present

## 2013-04-28 DIAGNOSIS — D509 Iron deficiency anemia, unspecified: Secondary | ICD-10-CM | POA: Diagnosis not present

## 2013-05-05 ENCOUNTER — Telehealth: Payer: Self-pay | Admitting: Neurology

## 2013-05-05 NOTE — Telephone Encounter (Signed)
Lab result is in patient's chart.

## 2013-05-21 ENCOUNTER — Ambulatory Visit (INDEPENDENT_AMBULATORY_CARE_PROVIDER_SITE_OTHER): Payer: Medicare Other

## 2013-05-21 DIAGNOSIS — M810 Age-related osteoporosis without current pathological fracture: Secondary | ICD-10-CM | POA: Diagnosis not present

## 2013-05-21 MED ORDER — DENOSUMAB 60 MG/ML ~~LOC~~ SOLN
60.0000 mg | Freq: Once | SUBCUTANEOUS | Status: AC
  Administered 2013-05-21: 60 mg via SUBCUTANEOUS

## 2013-05-29 DIAGNOSIS — I10 Essential (primary) hypertension: Secondary | ICD-10-CM | POA: Diagnosis not present

## 2013-05-29 DIAGNOSIS — D509 Iron deficiency anemia, unspecified: Secondary | ICD-10-CM | POA: Diagnosis not present

## 2013-05-29 DIAGNOSIS — N179 Acute kidney failure, unspecified: Secondary | ICD-10-CM | POA: Diagnosis not present

## 2013-06-04 ENCOUNTER — Other Ambulatory Visit: Payer: Self-pay | Admitting: Internal Medicine

## 2013-06-08 ENCOUNTER — Telehealth: Payer: Self-pay | Admitting: *Deleted

## 2013-06-08 NOTE — Telephone Encounter (Signed)
Pt called requesting refills on Oxycotin and Oxycodone.  Please advise

## 2013-06-08 NOTE — Telephone Encounter (Signed)
[place] for 1 month - will need OV - last visit June 30

## 2013-06-09 MED ORDER — OXYCODONE HCL 10 MG PO TB12: 10.0000 mg | ORAL_TABLET | Freq: Two times a day (BID) | ORAL | Status: DC

## 2013-06-09 MED ORDER — OXYCODONE HCL 10 MG PO TABS: 10.0000 mg | ORAL_TABLET | ORAL | Status: DC | PRN

## 2013-06-25 ENCOUNTER — Other Ambulatory Visit: Payer: Self-pay | Admitting: Internal Medicine

## 2013-06-25 DIAGNOSIS — H4720 Unspecified optic atrophy: Secondary | ICD-10-CM | POA: Diagnosis not present

## 2013-06-25 DIAGNOSIS — H2640 Unspecified secondary cataract: Secondary | ICD-10-CM | POA: Diagnosis not present

## 2013-06-25 DIAGNOSIS — H5010 Unspecified exotropia: Secondary | ICD-10-CM | POA: Diagnosis not present

## 2013-06-25 DIAGNOSIS — Z961 Presence of intraocular lens: Secondary | ICD-10-CM | POA: Diagnosis not present

## 2013-07-02 ENCOUNTER — Other Ambulatory Visit: Payer: Self-pay | Admitting: *Deleted

## 2013-07-02 ENCOUNTER — Other Ambulatory Visit: Payer: Self-pay | Admitting: Internal Medicine

## 2013-07-02 MED ORDER — LOSARTAN POTASSIUM 50 MG PO TABS: ORAL_TABLET | ORAL | Status: DC

## 2013-07-14 ENCOUNTER — Ambulatory Visit (INDEPENDENT_AMBULATORY_CARE_PROVIDER_SITE_OTHER): Payer: Medicare Other | Admitting: Internal Medicine

## 2013-07-14 ENCOUNTER — Other Ambulatory Visit: Payer: Self-pay

## 2013-07-14 ENCOUNTER — Encounter: Payer: Self-pay | Admitting: Internal Medicine

## 2013-07-14 ENCOUNTER — Other Ambulatory Visit (INDEPENDENT_AMBULATORY_CARE_PROVIDER_SITE_OTHER): Payer: Medicare Other

## 2013-07-14 ENCOUNTER — Ambulatory Visit (INDEPENDENT_AMBULATORY_CARE_PROVIDER_SITE_OTHER)
Admission: RE | Admit: 2013-07-14 | Discharge: 2013-07-14 | Disposition: A | Discharge status: Home / Self Care | Payer: Medicare Other | Source: Ambulatory Visit | Attending: Internal Medicine | Admitting: Internal Medicine

## 2013-07-14 VITALS — BP 100/60 | HR 105 | Temp 98.5°F | Wt 207.0 lb

## 2013-07-14 DIAGNOSIS — G63 Polyneuropathy in diseases classified elsewhere: Secondary | ICD-10-CM

## 2013-07-14 DIAGNOSIS — M316 Other giant cell arteritis: Secondary | ICD-10-CM | POA: Diagnosis not present

## 2013-07-14 DIAGNOSIS — R0602 Shortness of breath: Secondary | ICD-10-CM

## 2013-07-14 DIAGNOSIS — Z23 Encounter for immunization: Secondary | ICD-10-CM

## 2013-07-14 DIAGNOSIS — M8560 Other cyst of bone, unspecified site: Secondary | ICD-10-CM | POA: Insufficient documentation

## 2013-07-14 DIAGNOSIS — M8569 Other cyst of bone, multiple sites: Secondary | ICD-10-CM

## 2013-07-14 DIAGNOSIS — J479 Bronchiectasis, uncomplicated: Secondary | ICD-10-CM

## 2013-07-14 DIAGNOSIS — IMO0002 Reserved for concepts with insufficient information to code with codable children: Secondary | ICD-10-CM

## 2013-07-14 DIAGNOSIS — N259 Disorder resulting from impaired renal tubular function, unspecified: Secondary | ICD-10-CM

## 2013-07-14 DIAGNOSIS — R918 Other nonspecific abnormal finding of lung field: Secondary | ICD-10-CM | POA: Diagnosis not present

## 2013-07-14 DIAGNOSIS — N182 Chronic kidney disease, stage 2 (mild): Secondary | ICD-10-CM

## 2013-07-14 LAB — COMPREHENSIVE METABOLIC PANEL
Alkaline Phosphatase: 69 U/L (ref 39–117)
BUN: 28 mg/dL — ABNORMAL HIGH (ref 6–23)
Glucose, Bld: 113 mg/dL — ABNORMAL HIGH (ref 70–99)
Sodium: 141 mEq/L (ref 135–145)
Total Bilirubin: 1 mg/dL (ref 0.3–1.2)
Total Protein: 6.9 g/dL (ref 6.0–8.3)

## 2013-07-14 LAB — BRAIN NATRIURETIC PEPTIDE: Pro B Natriuretic peptide (BNP): 95 pg/mL (ref 0.0–100.0)

## 2013-07-14 MED ORDER — OXYCODONE HCL 10 MG PO TABS: 10.0000 mg | ORAL_TABLET | ORAL | Status: DC | PRN

## 2013-07-14 MED ORDER — OXYCODONE HCL ER 10 MG PO T12A: 10.0000 mg | EXTENDED_RELEASE_TABLET | Freq: Two times a day (BID) | ORAL | Status: DC

## 2013-07-14 MED ORDER — FUROSEMIDE 40 MG PO TABS: 20.0000 mg | ORAL_TABLET | Freq: Every day | ORAL | Status: DC

## 2013-07-14 NOTE — Patient Instructions (Addendum)
Good to see you [name].   For your shortness of breath, fatigue, and edema:  This is most likely due to fluid overload due to heart failure. We will check if this is the case today with a few labs and a chest XR. We will check your electrolytes, your kidney function, and a BNP--which tells [place] about fluid overload. We will also use the chest XR to see if there is fluid in your lungs and to rule out a lung infection as the cause of your symptoms.   Until we get this information back, you should take your furosemide--also called lasix-- at 40 mg. After we get these labs back, we may call you with further instructions about changes in your fluid pills.   We will likely order a heart ECHO, or a heart ultrasound later to check on your heart function.     For your temporal arteritis:  We will check your sedimentation rate today to make sure your current dose of prednisone is adequate.     We also gave you a flu shot today. For your back pain, you should call Dr. Nelly Laurence office to ask about their recommendations for a back brace.   Go to [email or web address] and click forgot password or call the help number on that page to reset your password.

## 2013-07-14 NOTE — Assessment & Plan Note (Signed)
Last ESR in July '14: 22  Plan:  Will check ESR today.

## 2013-07-14 NOTE — Progress Notes (Signed)
Subjective:     Patient ID: [name], female   DOB: [birth year], 72 y.o.   MRN: [id number]  HPI Ms.Heming is a 72 year old woman with a history of HTN, temporal arteritis, bronchiectasis, pulmonary fibrosis, diverticulosis, GERD, lower GI bleed, DM2, lumbar radiculopathy, polyneuropathy, osteoporosis, CKD stage 2, anemia of chronic illness, CVA, hyperlipidemia, left mastectomy for breast cancer, and a seizure d/o who presents for follow up on her chronic conditions.   1. For the past 3 months, she has had a enlarging "knot" on her right elbow. It is not itchy or painful at rest, but is somewhat tender to palpation. It never bleeds.   2. She has felt more weak and tired lately. She has been taking B12 pills for the past 2 weeks with no effect. She had some wheezing and drainage/cough at night. Now she has some drainage, cough, and SOB with increasing DOE.  3. She would also like a back brace. After her back surgery, she has felt like she's been leaning forwards a lot. She will see the surgeon again in December. She has had more back pain recently, and she tends to lean forward while using her walker. She notices the back pain most when straightening her back up. She does have some back pain when lying down, but it improves with rest. The oxycodone does help with the back pain.  4. She is seeing a dietician on 10/27 under the advice of Dr. Marval Regal (her nephrologist) for weight gain.  5. She would like a flu shot.  6. She would like her ESR checked for her temporal arteritis.  7. She needs a refill on both her oxycodone prescriptions, the 10 mg time release BID and the prn one. She takes the prn oxycodone about 3 times a day. This is mostly for her back pain--a manifestation of her polymyalgia rhematica.  8. She will see her rheumatologist Dr. Amil Amen later. She was told she has RA, but didn't start a medicine at that time. She believes it is time for her to start a medicine for that since her  hands have been in more pain lately.   Review of Systems General: No fevers, chills, or night sweats. A few times over the past few months she has had some "hot flashes". She is post-menopausal for 16 years. Has gained about 20 lbs in about 10 months. She has been gained weight steadily for a few years. In terms of her diet, she eats out a lot at fast food places, makes pasta.   CV: No chest pain.  Pulm: No chest tightness, wheezing as per HPI. Not a lot of coughing--non productive. Never a smoker. She does have a sister with lung cancer.  GI: No changes in bowels, no diarrhea, constipation or reflux.  Extremities: Better if she props her legs up at night. Recently had furosemide decreased from 40 mg to 20 mg daily 6 months ago, since he reported that she was dehydrated.     Objective:   Physical Exam Filed Vitals:   07/14/13 1309  BP: 100/60  Pulse: 105  Temp: 98.5 F (36.9 C)   General: Sitting in the exam room in NAD.  HEENT: TMs clear bilaterally. Oropharynx is clear without erythema or exudates.  CV: Systolic murmur, RRR.  Pulm: Diffuse crackles--more pronounced in the lower lobes.  Extremities: 2+ pitting edema up to the knee. On right elbow--small hard mass the size of a grape, no redness or swelling. Tenderness only over the central  part of the mass. Small punctum in the center of the mass--no discharge.   Current Outpatient Prescriptions on File Prior to Visit  Medication Sig Dispense Refill  . amLODipine (NORVASC) 5 MG tablet Take 2.5 mg by mouth daily.      Marland Kitchen aspirin 81 MG tablet Take 81 mg by mouth. Twice per week on wed and sunday      . calcium carbonate (OS-CAL) 600 MG TABS Take 600 mg by mouth daily.       . cholecalciferol (VITAMIN D) 1000 UNITS tablet Take 1,000 Units by mouth daily.        Marland Kitchen denosumab (PROLIA) 60 MG/ML SOLN Inject 60 mg into the skin every 6 (six) months.        . ferrous sulfate 325 (65 FE) MG tablet Take 325 mg by mouth 2 (two) times daily.        Marland Kitchen gabapentin (NEURONTIN) 300 MG capsule Take 1 capsule (300 mg total) by mouth 4 (four) times daily.  90 capsule  11  . losartan (COZAAR) 50 MG tablet TAKE 1 TABLET DAILY  30 tablet  0  . Multiple Vitamins-Minerals (MULTIVITAMIN,TX-MINERALS) tablet Take 1 tablet by mouth daily.        Marland Kitchen oxyCODONE (OXYCONTIN) 10 MG 12 hr tablet Take 1 tablet (10 mg total) by mouth every 12 (twelve) hours.  60 tablet  0  . Oxycodone HCl 10 MG TABS Take 1 tablet (10 mg total) by mouth every 4 (four) hours as needed.  180 tablet  0  . predniSONE (DELTASONE) 10 MG tablet TAKE 1 TABLET DAILY  90 tablet  2  . predniSONE (DELTASONE) 5 MG tablet Take 2.5 mg by mouth daily. Takes 1(5mg ) tablet with 1(10mg ) tablet for total dose 15mg       . simvastatin (ZOCOR) 5 MG tablet Take 1 tablet (5 mg total) by mouth at bedtime.  90 tablet  3  . [DISCONTINUED] potassium chloride (K-DUR) 10 MEQ tablet Take 1 tablet (10 mEq total) by mouth 2 (two) times daily.  180 tablet  0   No current facility-administered medications on file prior to visit.       Assessment:     Ms. Hipps is a 72 year old woman with a complicated medical history who presents for evaluation of her chronic conditions.     Plan:     See assessment and plan.

## 2013-07-14 NOTE — Telephone Encounter (Signed)
Received a refill request for Furosemide 40 mg QD. On her med list it reads Furosemide 40 mg take 1/2 tab daily. Please clarify directions so prescription can be sent.

## 2013-07-14 NOTE — Assessment & Plan Note (Signed)
Continued chronic back pain and polyarticular pain.   Plan:  Will refill her oxycodone prescriptions today.  [name] plans to follow up with her rheumatologist for medication management of her RA.

## 2013-07-14 NOTE — Assessment & Plan Note (Signed)
Small, grape sized lesion on the extensor surface of the right forearm--just distal to the elbow. It is hard, non-fluctuant, with some tenderness to palpation on deep palpation of the center. No erythema or edema.

## 2013-07-14 NOTE — Assessment & Plan Note (Addendum)
Given the recent decrease in her furosemide and her presentation with SOB, bibasilar lung crackles, 2+ pitting edema to the knees, and decreased energy, will consider CHF with fluid overload. Most recent ECHO was in Dec '13 which showed normal EF but grade 1 diastolic CHF. On cardiac exam, there was a soft systolic murmur, possibly indicating aortic stenosis as an etiology of CHF.   Given her history of bronchiectasis and chronic prednisone use, indolent or atypical lung infection is also possible. No fever, chills, or night sweats. CXR will also help r/o infection.   Plan:  CXR to look for pulmonary edema. BNP and CMP to look for fluid overload. Patient instructed to increase furosemide dose back to 40 mg daily for now.   Addendum: CXR -  IMPRESSION:  1. Bibasilar opacities, representing atelectasis or infiltrate.  2. Chronic prominence of the interstitial markings.  3. There is 46mm anterolisthesis of T10 on T11, with spinal fusion  hardware seen at T11 through the visualized upper to mid lumbar  spine.  BNP -  95  Will likely need follow-up ECHO to evaluate current stage of CHF, may require further adjustment of furosemide dose based on findings on labs and CXR.

## 2013-07-16 NOTE — Assessment & Plan Note (Signed)
Continued back pain but less severe or debilitating.  Plan She is instructed to call Dr. Nelly Laurence office for a back brace.

## 2013-07-20 ENCOUNTER — Encounter: Payer: Medicare Other | Attending: Nephrology | Admitting: *Deleted

## 2013-07-20 ENCOUNTER — Encounter: Payer: Self-pay | Admitting: *Deleted

## 2013-07-20 DIAGNOSIS — E669 Obesity, unspecified: Secondary | ICD-10-CM | POA: Insufficient documentation

## 2013-07-20 DIAGNOSIS — Z713 Dietary counseling and surveillance: Secondary | ICD-10-CM | POA: Insufficient documentation

## 2013-07-20 DIAGNOSIS — E663 Overweight: Secondary | ICD-10-CM | POA: Insufficient documentation

## 2013-07-20 NOTE — Progress Notes (Signed)
Medical Nutrition Therapy:  Appt start time: 0915 end time:  [id number].  Assessment: Patient with stage 3 chronic kidney disease, with neuropathy. She is here today for weight management counseling. Her weight has been stable for the last month at around 208 pounds. She reports that she stopped cooking after her husband passed away in 2007-12-31 and mostly eats out at [employer]. She also reports snacking on sweets or chips when at home. She also drinks sweet tea throughout the day. She does not exercise due to neuropathy and balance issues. She also becomes winded easily. We discussed options for physical activity, including Silver Sneakers program at the [employer] ([employer], recumbent bike). However, she acknowledges that she wants to focus mainly on diet.    MEDICATIONS: Losartan, furosemide, amlodopine, prednisone, Klor-con, iron, simvastatin, vitamin D3, calcium/vitamin D   DIETARY INTAKE:   Usual eating pattern includes 2 meals and 3-4 snacks per day.  24-hr recall:  B ( AM): [employer], chicken biscuit on bun, sweet tea (senior size)  Snk ( AM): None  L ( PM): Grapes, peanut butter crackers, chips Snk ( PM): 2-3 Reece[employer] cups, chips, peanut butter crackers, grapes D ( PM): Fried chicken sandwich OR spaghetti, sweet tea Snk ( PM): Same Beverages: Sweet tea  Usual physical activity: None due to mobility and balance  Estimated energy needs: 1200 calories 150 g carbohydrates 75 g protein 33 g fat  Progress Towards Goal(s):  In progress.   Nutritional Diagnosis:  [place]-3.3 Overweight/obesity As related to excessive energy intake and physical inactivity.  As evidenced by BMI 38.2.    Intervention:  Nutrition counseling.We discussed strategies for weight loss, including balancing nutrients (carbs, protein, fat), portion control, healthy snacks, and exercise.   Goals: 1. 0.5-1 pound weight loss per week. Goal weight: 170 pounds. 2. Eat 2 main meals with 3 snacks daily.  3. Limit  snacks to no more than 150 calories, choosing healthy options (fruit, crackers with peanut butter) more frequently.  4. Limit sweetened drinks.  5. Choose healthier options when eating out.  6. Cook at home at least 2 days weekly.  7. Consider starting an exercise program.   Handouts given during visit include:  Weight loss tips  Monitoring/Evaluation:  Dietary intake, exercise, and body weight in 6 week(s).

## 2013-07-21 ENCOUNTER — Telehealth: Payer: Self-pay | Admitting: *Deleted

## 2013-07-21 ENCOUNTER — Telehealth: Payer: Self-pay

## 2013-07-21 NOTE — Telephone Encounter (Signed)
Patient informed of results of chest x-ray and sed rate lab.

## 2013-07-22 NOTE — Telephone Encounter (Signed)
I called patient. The sedimentation rate is 25, it was 22 (upper limits of normal) 3 months ago. There has not been a change in the sedimentation rate, I would opt not to change the prednisone dose. The patient feels well at this point. If she begins to feel malaise, low-grade fevers, headaches, I will go on the dose. The patient understands.

## 2013-07-23 DIAGNOSIS — M069 Rheumatoid arthritis, unspecified: Secondary | ICD-10-CM | POA: Diagnosis not present

## 2013-07-23 DIAGNOSIS — N189 Chronic kidney disease, unspecified: Secondary | ICD-10-CM | POA: Diagnosis not present

## 2013-07-23 DIAGNOSIS — M316 Other giant cell arteritis: Secondary | ICD-10-CM | POA: Diagnosis not present

## 2013-07-23 DIAGNOSIS — M1990 Unspecified osteoarthritis, unspecified site: Secondary | ICD-10-CM | POA: Diagnosis not present

## 2013-07-28 ENCOUNTER — Ambulatory Visit (INDEPENDENT_AMBULATORY_CARE_PROVIDER_SITE_OTHER): Payer: Medicare Other

## 2013-07-28 DIAGNOSIS — Z23 Encounter for immunization: Secondary | ICD-10-CM | POA: Diagnosis not present

## 2013-08-04 ENCOUNTER — Other Ambulatory Visit: Payer: Self-pay

## 2013-08-04 MED ORDER — GABAPENTIN 300 MG PO CAPS: 300.0000 mg | ORAL_CAPSULE | Freq: Four times a day (QID) | ORAL | Status: DC

## 2013-08-04 NOTE — Telephone Encounter (Signed)
Patient called in for a refill on Gabapentin 300 mg qid be [facility]

## 2013-08-05 ENCOUNTER — Other Ambulatory Visit: Payer: Self-pay

## 2013-08-05 ENCOUNTER — Telehealth: Payer: Self-pay

## 2013-08-05 MED ORDER — GABAPENTIN 300 MG PO CAPS: 300.0000 mg | ORAL_CAPSULE | Freq: Four times a day (QID) | ORAL | Status: DC

## 2013-08-05 NOTE — Telephone Encounter (Signed)
Patient has her prednisone dose adjusted based on sed rate. Best to talk with her about her dosing so we prescribe correctly for her.

## 2013-08-05 NOTE — Telephone Encounter (Signed)
Received a fax from [facility]. Patient is requesting a new script for Prednisone 2 .5 mg.  I do not see this dosage on current med list. Please advise.

## 2013-08-06 MED ORDER — PREDNISONE 2.5 MG PO TABS: 2.5000 mg | ORAL_TABLET | Freq: Every day | ORAL | Status: DC

## 2013-08-06 NOTE — Telephone Encounter (Signed)
I spoke to patient and she states she is needing 2.5 mg Prednisone prescription. She takes this with her 10 mg prescription. New script has been sent to [facility]

## 2013-08-24 ENCOUNTER — Ambulatory Visit (INDEPENDENT_AMBULATORY_CARE_PROVIDER_SITE_OTHER): Payer: Medicare Other | Admitting: Neurology

## 2013-08-24 ENCOUNTER — Encounter: Payer: Self-pay | Admitting: Neurology

## 2013-08-24 VITALS — BP 110/64 | HR 85 | Ht 63.0 in | Wt 199.0 lb

## 2013-08-24 DIAGNOSIS — G63 Polyneuropathy in diseases classified elsewhere: Secondary | ICD-10-CM | POA: Diagnosis not present

## 2013-08-24 DIAGNOSIS — M316 Other giant cell arteritis: Secondary | ICD-10-CM

## 2013-08-24 NOTE — Progress Notes (Signed)
Reason for visit: Temporal arteritis, peripheral neuropathy  [name] is an 72 y.o. female  History of present illness:  [name] is a 72 year old right-handed white female with a history of temporal arteritis on prednisone, and a history of a peripheral neuropathy. The patient has had lumbosacral spine disease with a left sided lumbosacral radiculopathy associated with a left footdrop. The patient uses a walker for ambulation, and she denies any recent falls. The patient still has a lot of discomfort associated with the peripheral neuropathy, and she takes gabapentin 300 mg 4 times a day. The patient had a lot of swelling below the knees, and the gabapentin has not completely controlled the pain. The patient may have problems sleeping at night because of discomfort. The patient comes to this office for an evaluation. The patient has not had any significant changes in her vision since last seen. The last sedimentation rate done on 07/14/2013 was 25.  Past Medical History  Diagnosis Date  . History of breast cancer   . Osteoporosis   . Idiopathic pulmonary fibrosis   . Bronchiectasis   . Vitamin D deficiency   . Hyperlipidemia   . Allergic rhinitis   . GERD (gastroesophageal reflux disease)   . Left knee DJD   . Anxiety   . Migraine   . Diverticulosis of colon   . Seizure disorder   . Temporal arteritis     Right eye blind, on steroids per [facility]/ Dr [name]  . CVA (cerebral infarction) 10/2007    Right thalamic   . PMR (polymyalgia rheumatica)   . Anemia   . Gait disorder   . Peripheral neuropathy   . HTN (hypertension)   . Type II or unspecified type diabetes mellitus without mention of complication, not stated as uncontrolled     2nd to steriods  . Renal insufficiency   . Previous back surgery 10/09/12  . Polyneuropathy in other diseases classified elsewhere 02/17/2013    Past Surgical History  Procedure Laterality Date  . Mastectomy    . Cholecystectomy    .  Total knee arthroplasty      Left  . Tonsillectomy    . Cataract extraction      OS - Summer 2010  . Lumbar fusion  04/2010    W/Mechanical fixation - [facility]  . Spinal fusion  07/2010    T10-L2 interbody fusion / [facility]  . Colonoscopy  12/15/2011    Procedure: COLONOSCOPY;  Surgeon: [name], MD;  Location: [facility];  Service: Endoscopy;  Laterality: N/A;  . Carpal tunnel release Right     Family History  Problem Relation Age of Onset  . Brain cancer Mother   . Hypertension Mother   . Dementia Father   . Hypertension Father   . Breast cancer Sister   . Multiple sclerosis Sister     Social history:  reports that she has never smoked. She has never used smokeless tobacco. She reports that she does not drink alcohol or use illicit drugs.    Allergies  Allergen Reactions  . Neurontin [Gabapentin]     sleepy    Medications:  Current Outpatient Prescriptions on File Prior to Visit  Medication Sig Dispense Refill  . amLODipine (NORVASC) 5 MG tablet Take 2.5 mg by mouth daily.      Marland Kitchen aspirin 81 MG tablet Take 81 mg by mouth. Twice per week on wed and sunday      . calcium carbonate (OS-CAL)  600 MG TABS Take 600 mg by mouth daily.       . cholecalciferol (VITAMIN D) 1000 UNITS tablet Take 1,000 Units by mouth daily.        . Cyanocobalamin (VITAMIN B-12 PO) Take 1 tablet by mouth daily.      Marland Kitchen denosumab (PROLIA) 60 MG/ML SOLN Inject 60 mg into the skin every 6 (six) months.        . ferrous sulfate 325 (65 FE) MG tablet Take 325 mg by mouth 2 (two) times daily.       Marland Kitchen gabapentin (NEURONTIN) 300 MG capsule Take 1 capsule (300 mg total) by mouth 4 (four) times daily.  360 capsule  3  . losartan (COZAAR) 50 MG tablet TAKE 1 TABLET DAILY  30 tablet  0  . Multiple Vitamins-Minerals (MULTIVITAMIN,TX-MINERALS) tablet Take 1 tablet by mouth daily.        . OxyCODONE (OXYCONTIN) 10 mg T12A 12 hr tablet Take 1 tablet (10 mg total) by mouth every 12 (twelve)  hours. FILL ON OR AFTER 09/13/2013  60 tablet  0  . Oxycodone HCl 10 MG TABS Take 1 tablet (10 mg total) by mouth every 4 (four) hours as needed. FILL ON OR AFTER 09/13/2013  180 tablet  0  . potassium chloride (K-DUR,KLOR-CON) 10 MEQ tablet Take 10 mEq by mouth daily.      . predniSONE (DELTASONE) 10 MG tablet TAKE 1 TABLET DAILY  90 tablet  2  . predniSONE (DELTASONE) 2.5 MG tablet Take 1 tablet (2.5 mg total) by mouth daily with breakfast.  90 tablet  3  . simvastatin (ZOCOR) 5 MG tablet Take 1 tablet (5 mg total) by mouth at bedtime.  90 tablet  3  . [DISCONTINUED] potassium chloride (K-DUR) 10 MEQ tablet Take 1 tablet (10 mEq total) by mouth 2 (two) times daily.  180 tablet  0   No current facility-administered medications on file prior to visit.    ROS:  Out of a complete 14 system review of symptoms, the patient complains only of the following symptoms, and all other reviewed systems are negative.  Swelling in the legs Ringing in the ears Shortness of breath Joint pain  Blood pressure 110/64, pulse 85, height 5\' 3"  (1.6 m), weight 199 lb (90.266 kg).  Physical Exam  General: The patient is alert and cooperative at the time of the examination.  Skin: No significant peripheral edema is noted.   Neurologic Exam  Mental status: The patient is oriented x 3.  Cranial nerves: Facial symmetry is present. Speech is normal, no aphasia or dysarthria is noted. Extraocular movements are full. Visual fields are full.  Motor: The patient has good strength in all 4 extremities.  Sensory examination:  Coordination: The patient has good finger-nose-finger and heel-to-shin bilaterally.  Gait and station: The patient has a normal gait. Tandem gait is normal. Romberg is negative. No drift is seen.  Reflexes: Deep tendon reflexes are symmetric.   Assessment/Plan:  1. Temporal arthritis  2. Peripheral neuropathy  3. Gait disorder  4. Left footdrop, lumbosacral  radiculopathy  The patient will continue her prednisone at 12.5 mg daily for now. The patient was given samples of Cymbalta to try for her peripheral neuropathy pain. If this is very effective, she may feel come down off of some of her gabapentin. The patient will followup in 4-6 months.  Jill Alexanders MD 08/24/2013 6:10 PM  [facility] [address] [place], [place] [address]  Phone [phone] Fax [phone]  

## 2013-08-24 NOTE — Patient Instructions (Signed)
Temporal Arteritis Arteries are blood vessels that carry blood from the heart to all parts of the body. Temporal arteritis is a swelling (inflammation) of certain large arteries. This usually affects arteries in the head and neck area, including arteries in the area on the side of the head, between the ears and eyes (temples). The condition can be very painful. It also can cause serious problems, even blindness. Early diagnosis and treatment is very important. CAUSES  Temporal arteritis results from the body reacting to injury or infection (inflammation). This may occur when the body's immune system (which fights germs and disease) makes a mistake. It attacks its own arteries. No one knows why this happens. However, certain things (risk factors) make it more likely that a person will develop temporal arteritis. They include:  Age. Most people with temporal arteritis are older than 50. The average age is 70.  Sex. Three times more women than men develop the condition.  Race and ethnic background. Caucasians are more likely to have temporal arteritis than other races. So are people whose families came from [place] ([place], [place], [place] or [place]).  Having polymyalgia rheumatica (PMR). This condition causes stiffness and pain in the joints of the neck, shoulders and hips. About 15% of people with PMR also have temporal arteritis. SYMPTOMS  Not everyone with temporal arteritis has the same symptoms. Some people have just one symptom. Others may have several. The most common symptom is a new headache, often in the temple region. Symptoms may show up in other parts of the body too.   Symptoms affecting the head may include:  Temporal arteries that feel hard or swollen. It may hurt when the temples are touched.  Pain when combing your hair, or when laying your head on a pillow.  Pain in the jaw when chewing.  Pain in the throat or tongue.  Visual problems, including sudden loss of  vision in one eye, or seeing double.  Symptoms in other parts of the body may include:  Fever.  Fatigue.  A dry cough.  Pain in the hips and shoulders.  Pain in the arms during exercise.  Depression.  Weight loss. DIAGNOSIS  Symptoms of temporal arteritis are similar to symptoms for other conditions. This can make it hard to tell if you have the condition. To be sure, your caregiver will ask about your symptoms and do a physical exam. Certain tests may be necessary, such as:   An exam of your temples. Often, the temporal arteries will be swollen and hard. This can be felt.  A complete blood count. This test shows how many red blood cells are in your blood. Most people with temporal arteritis do not have enough red blood cells (anemic).  Erythrocyte sedimentation (also called sed rate test). It measures inflammation in the body. Almost everyone with temporal arteritis has a high sed rate.  C reactive protein test. This also shows if there is inflammation.  Biopsy a temporal artery. This means the caregiver will take out a small piece of an artery. Then, it is checked under a microscope for inflammation. More than one biopsy may be needed. That is because inflammation can be in one part of an artery and not in others. Your caregiver may need to check more than one spot. TREATMENT  Starting treatment right away is very important. Often, you will need to see a specialist in immunologic diseases (rheumatologist). Goals of treatment include protecting your eyesight. Once vision is gone, it might not come   back. The normal treatment is medication. It usually works well and quickly. Most people start getting better in a few days. Medication options include:  Corticosteroids. These are powerful drugs that fight inflammation. These drugs are most often used to treat temporal arteritis.  Usually, a high dose is taken at first. After symptoms improve, a smaller dose is used. The goal is to take  the smallest dose possible and still control your symptoms. That is because using corticosteroids for a long time can cause problems. They can make muscles and bones weak. They can cause blood pressure to go up, and cause diabetes. Also, people often gain weight when they take corticosteroids. Corticosteroids may need to be taken for one or two years.  Several newer drugs are being tested to treat temporal arteritis. Researchers are testing to see if new drugs will work as well as corticosteroids, but cause fewer problems than them. Testing of these drugs is not yet complete.  Some specialists recommend low dose aspirin to prevent blood clots. HOME CARE INSTRUCTIONS   Take any corticosteroids that your caregiver prescribes. Follow the directions carefully.  Take any vitamins or supplements that your caregiver suggests. This may include vitamin D and calcium. They help keep your bones from becoming weak.  Keep all appointments for checkups. Your caregiver will watch for any problems from the medication. Checkups may include:  Periodic blood tests.  Bone density testing. This checks how strong or weak your bones are.  Blood pressure checks. If your blood pressure rises, you may need to take a drug to control it while you are taking corticosteroids.  Blood sugar checks. This is to be sure you are not developing diabetes. If you have diabetes, corticosteroid medications may make it worse and require increased treatment.  Exercise. First, talk with your caregiver about what would be OK for you to do. Aerobic exercise (which increases your heart rate) is usually suggested. It does not have to require a lot of energy. Walking is aerobic exercise. This type of exercise is good because it helps prevent bone loss. It also helps control your blood pressure.  Follow a healthy diet. The goal is to prevent bone damage and diabetes. Include good sources of protein in your diet. Also, include fruits,  vegetables and whole grains. Your caregiver can refer you to an expert on healthy eating (dietitian) for more advice. SEEK MEDICAL CARE IF:   The symptoms that lead to your diagnosis return.  You develop worsening fever, fatigue, headache, weight loss, or pain in your jaw.  You develop signs of infection. Infections can be worse if you are on corticosteroid medication. SEEK IMMEDIATE MEDICAL CARE IF:   Your eyesight changes.  Pain does not go away, even after taking pain medicine.  You feel pain in your chest.  Breathing is difficult.  One side of your face or body suddenly becomes weak or numb.  You develop a fever of more than 102 F (38.9 C). Document Released: 07/08/2009 Document Revised: 12/03/2011 Document Reviewed: 07/08/2009 Exit[employer] Patient Information 2014 [employer].  

## 2013-08-27 ENCOUNTER — Ambulatory Visit: Payer: Medicare Other | Admitting: *Deleted

## 2013-09-04 ENCOUNTER — Encounter (HOSPITAL_COMMUNITY): Payer: Self-pay | Admitting: Emergency Medicine

## 2013-09-04 ENCOUNTER — Emergency Department (HOSPITAL_COMMUNITY)
Admission: EM | Admit: 2013-09-04 | Discharge: 2013-09-04 | Disposition: A | Discharge status: Home / Self Care | Payer: Medicare Other | Attending: Emergency Medicine | Admitting: Emergency Medicine

## 2013-09-04 ENCOUNTER — Emergency Department (HOSPITAL_COMMUNITY): Payer: Medicare Other

## 2013-09-04 DIAGNOSIS — D649 Anemia, unspecified: Secondary | ICD-10-CM | POA: Insufficient documentation

## 2013-09-04 DIAGNOSIS — IMO0002 Reserved for concepts with insufficient information to code with codable children: Secondary | ICD-10-CM | POA: Insufficient documentation

## 2013-09-04 DIAGNOSIS — F411 Generalized anxiety disorder: Secondary | ICD-10-CM | POA: Diagnosis not present

## 2013-09-04 DIAGNOSIS — G43909 Migraine, unspecified, not intractable, without status migrainosus: Secondary | ICD-10-CM | POA: Insufficient documentation

## 2013-09-04 DIAGNOSIS — E119 Type 2 diabetes mellitus without complications: Secondary | ICD-10-CM | POA: Insufficient documentation

## 2013-09-04 DIAGNOSIS — Z8719 Personal history of other diseases of the digestive system: Secondary | ICD-10-CM | POA: Insufficient documentation

## 2013-09-04 DIAGNOSIS — M810 Age-related osteoporosis without current pathological fracture: Secondary | ICD-10-CM | POA: Diagnosis not present

## 2013-09-04 DIAGNOSIS — Z7982 Long term (current) use of aspirin: Secondary | ICD-10-CM | POA: Insufficient documentation

## 2013-09-04 DIAGNOSIS — H5440 Blindness, one eye, unspecified eye: Secondary | ICD-10-CM | POA: Diagnosis not present

## 2013-09-04 DIAGNOSIS — Z87448 Personal history of other diseases of urinary system: Secondary | ICD-10-CM | POA: Insufficient documentation

## 2013-09-04 DIAGNOSIS — Z8673 Personal history of transient ischemic attack (TIA), and cerebral infarction without residual deficits: Secondary | ICD-10-CM | POA: Insufficient documentation

## 2013-09-04 DIAGNOSIS — R404 Transient alteration of awareness: Secondary | ICD-10-CM | POA: Diagnosis not present

## 2013-09-04 DIAGNOSIS — I10 Essential (primary) hypertension: Secondary | ICD-10-CM | POA: Insufficient documentation

## 2013-09-04 DIAGNOSIS — R42 Dizziness and giddiness: Secondary | ICD-10-CM | POA: Diagnosis not present

## 2013-09-04 DIAGNOSIS — E785 Hyperlipidemia, unspecified: Secondary | ICD-10-CM | POA: Insufficient documentation

## 2013-09-04 DIAGNOSIS — E559 Vitamin D deficiency, unspecified: Secondary | ICD-10-CM | POA: Insufficient documentation

## 2013-09-04 DIAGNOSIS — Z79899 Other long term (current) drug therapy: Secondary | ICD-10-CM | POA: Diagnosis not present

## 2013-09-04 DIAGNOSIS — G609 Hereditary and idiopathic neuropathy, unspecified: Secondary | ICD-10-CM | POA: Diagnosis not present

## 2013-09-04 DIAGNOSIS — Z853 Personal history of malignant neoplasm of breast: Secondary | ICD-10-CM | POA: Diagnosis not present

## 2013-09-04 DIAGNOSIS — Z8709 Personal history of other diseases of the respiratory system: Secondary | ICD-10-CM | POA: Diagnosis not present

## 2013-09-04 DIAGNOSIS — G40909 Epilepsy, unspecified, not intractable, without status epilepticus: Secondary | ICD-10-CM | POA: Diagnosis not present

## 2013-09-04 DIAGNOSIS — M1710 Unilateral primary osteoarthritis, unspecified knee: Secondary | ICD-10-CM | POA: Insufficient documentation

## 2013-09-04 DIAGNOSIS — Z791 Long term (current) use of non-steroidal anti-inflammatories (NSAID): Secondary | ICD-10-CM | POA: Insufficient documentation

## 2013-09-04 LAB — CBC WITH DIFFERENTIAL/PLATELET
Basophils Absolute: 0 10*3/uL (ref 0.0–0.1)
Basophils Relative: 0 % (ref 0–1)
Eosinophils Absolute: 0 10*3/uL (ref 0.0–0.7)
Eosinophils Relative: 0 % (ref 0–5)
HCT: 42.9 % (ref 36.0–46.0)
Lymphocytes Relative: 12 % (ref 12–46)
Lymphs Abs: 0.9 10*3/uL (ref 0.7–4.0)
MCH: 32.3 pg (ref 26.0–34.0)
MCHC: 33.1 g/dL (ref 30.0–36.0)
MCV: 97.7 fL (ref 78.0–100.0)
Monocytes Absolute: 0.3 10*3/uL (ref 0.1–1.0)
Neutrophils Relative %: 84 % — ABNORMAL HIGH (ref 43–77)
RBC: 4.39 MIL/uL (ref 3.87–5.11)
RDW: 14.6 % (ref 11.5–15.5)

## 2013-09-04 LAB — COMPREHENSIVE METABOLIC PANEL
ALT: 22 U/L (ref 0–35)
AST: 22 U/L (ref 0–37)
CO2: 25 mEq/L (ref 19–32)
Calcium: 9.4 mg/dL (ref 8.4–10.5)
Creatinine, Ser: 1.46 mg/dL — ABNORMAL HIGH (ref 0.50–1.10)
GFR calc non Af Amer: 34 mL/min — ABNORMAL LOW (ref >90)
Potassium: 4 mEq/L (ref 3.5–5.1)
Sodium: 138 mEq/L (ref 135–145)
Total Protein: 6.8 g/dL (ref 6.0–8.3)

## 2013-09-04 MED ORDER — MECLIZINE HCL 25 MG PO TABS
25.0000 mg | ORAL_TABLET | Freq: Once | ORAL | Status: AC
  Administered 2013-09-04: 25 mg via ORAL
  Filled 2013-09-04: qty 1

## 2013-09-04 MED ORDER — CLINDAMYCIN HCL 300 MG PO CAPS
300.0000 mg | ORAL_CAPSULE | Freq: Once | ORAL | Status: AC
  Administered 2013-09-04: 300 mg via ORAL
  Filled 2013-09-04: qty 1

## 2013-09-04 MED ORDER — MECLIZINE HCL 12.5 MG PO TABS: 12.5000 mg | ORAL_TABLET | Freq: Three times a day (TID) | ORAL | Status: DC | PRN

## 2013-09-04 MED ORDER — SODIUM CHLORIDE 0.9 % IV BOLUS (SEPSIS)
1000.0000 mL | Freq: Once | INTRAVENOUS | Status: AC
  Administered 2013-09-04: 1000 mL via INTRAVENOUS

## 2013-09-04 MED ORDER — METOCLOPRAMIDE HCL 5 MG/ML IJ SOLN
10.0000 mg | Freq: Once | INTRAMUSCULAR | Status: AC
  Administered 2013-09-04: 10 mg via INTRAVENOUS
  Filled 2013-09-04: qty 2

## 2013-09-04 NOTE — ED Notes (Signed)
Per EMS pt coming from home with c/o dizziness/vertigo. Per EMS pt reports was at the mall earlier today when she started feeling dizzy. Pt denies hx of same, denies pain/nausea.

## 2013-09-04 NOTE — ED Provider Notes (Signed)
CSN: [id number]     Arrival date & time 09/04/13  1510 History   First MD Initiated Contact with Patient 09/04/13 1517     Chief Complaint  Patient presents with  . Dizziness   (Consider location/radiation/quality/duration/timing/severity/associated sxs/prior Treatment) The history is provided by the patient.  [name] is a 72 y.o. female history of breast cancer in remission, stroke, diabetes, hypertension here presenting with dizziness and vertigo. She suddenly became vertiginous around 11 AM while at home. She notes that she is falling to the left constantly. She drove to the mall and came back and needed help to get out of her car because of dizziness. She feels that she is spinning. Denies any headaches or passing out lightheadedness. No history of vertigo in the past.    Past Medical History  Diagnosis Date  . History of breast cancer   . Osteoporosis   . Idiopathic pulmonary fibrosis   . Bronchiectasis   . Vitamin D deficiency   . Hyperlipidemia   . Allergic rhinitis   . GERD (gastroesophageal reflux disease)   . Left knee DJD   . Anxiety   . Migraine   . Diverticulosis of colon   . Seizure disorder   . Temporal arteritis     Right eye blind, on steroids per [facility]/ Dr [name]  . CVA (cerebral infarction) 10/2007    Right thalamic   . PMR (polymyalgia rheumatica)   . Anemia   . Gait disorder   . Peripheral neuropathy   . HTN (hypertension)   . Type II or unspecified type diabetes mellitus without mention of complication, not stated as uncontrolled     2nd to steriods  . Renal insufficiency   . Previous back surgery 10/09/12  . Polyneuropathy in other diseases classified elsewhere 02/17/2013   Past Surgical History  Procedure Laterality Date  . Mastectomy    . Cholecystectomy    . Total knee arthroplasty      Left  . Tonsillectomy    . Cataract extraction      OS - Summer 2010  . Lumbar fusion  04/2010    W/Mechanical fixation - [facility]  .  Spinal fusion  07/2010    T10-L2 interbody fusion / [facility]  . Colonoscopy  12/15/2011    Procedure: COLONOSCOPY;  Surgeon: [name], MD;  Location: [facility];  Service: Endoscopy;  Laterality: N/A;  . Carpal tunnel release Right    Family History  Problem Relation Age of Onset  . Brain cancer Mother   . Hypertension Mother   . Dementia Father   . Hypertension Father   . Breast cancer Sister   . Multiple sclerosis Sister    History  Substance Use Topics  . Smoking status: Never Smoker   . Smokeless tobacco: Never Used  . Alcohol Use: No     Comment: heavy drinker until 1995 - sobriety with AA   OB History   Grav Para Term Preterm Abortions TAB SAB Ect Mult Living                 Review of Systems  Neurological: Positive for dizziness.  All other systems reviewed and are negative.    Allergies  Neurontin  Home Medications   Current Outpatient Rx  Name  Route  Sig  Dispense  Refill  . amLODipine (NORVASC) 5 MG tablet   Oral   Take 5 mg by mouth daily.          Marland Kitchen  aspirin 81 MG tablet   Oral   Take 81 mg by mouth. Twice per week on wed and sunday         . calcium carbonate (OS-CAL) 600 MG TABS   Oral   Take 600 mg by mouth daily.          . cholecalciferol (VITAMIN D) 1000 UNITS tablet   Oral   Take 1,000 Units by mouth daily.           . Cyanocobalamin (VITAMIN B-12 PO)   Oral   Take 1 tablet by mouth daily.         . Diclofenac Sodium (VOLTAREN EX)   Apply externally   Apply topically. Rub on both once daily         . ferrous sulfate 325 (65 FE) MG tablet   Oral   Take 325 mg by mouth 2 (two) times daily.          . furosemide (LASIX) 40 MG tablet   Oral   Take 40 mg by mouth daily.         Marland Kitchen gabapentin (NEURONTIN) 300 MG capsule   Oral   Take 1 capsule (300 mg total) by mouth 4 (four) times daily.   360 capsule   3   . losartan (COZAAR) 50 MG tablet      TAKE 1 TABLET DAILY   30 tablet   0   . Multiple  Vitamins-Minerals (MULTIVITAMIN,TX-MINERALS) tablet   Oral   Take 1 tablet by mouth daily.           . OxyCODONE (OXYCONTIN) 10 mg T12A 12 hr tablet   Oral   Take 1 tablet (10 mg total) by mouth every 12 (twelve) hours. FILL ON OR AFTER 09/13/2013   60 tablet   0   . Oxycodone HCl 10 MG TABS   Oral   Take 10 mg by mouth every 4 (four) hours as needed (breakthrough pain. Due to fill on 09/13/13).         . potassium chloride (K-DUR,KLOR-CON) 10 MEQ tablet   Oral   Take 10 mEq by mouth daily.         . predniSONE (DELTASONE) 10 MG tablet      TAKE 1 TABLET DAILY   90 tablet   2   . predniSONE (DELTASONE) 2.5 MG tablet   Oral   Take 1 tablet (2.5 mg total) by mouth daily with breakfast.   90 tablet   3   . simvastatin (ZOCOR) 5 MG tablet   Oral   Take 1 tablet (5 mg total) by mouth at bedtime.   90 tablet   3   . denosumab (PROLIA) 60 MG/ML SOLN   Subcutaneous   Inject 60 mg into the skin every 6 (six) months.            SpO2 95% Physical Exam  Nursing note and vitals reviewed. Constitutional: She is oriented to person, place, and time.  Chronically ill, uncomfortable   HENT:  Head: Normocephalic.  Mouth/Throat: Oropharynx is clear and moist.  Eyes: Conjunctivae are normal. Pupils are equal, round, and reactive to light.  + nystagmus to the R side   Neck: Normal range of motion. Neck supple.  Cardiovascular: Normal rate, regular rhythm and normal heart sounds.   Pulmonary/Chest: Effort normal and breath sounds normal. No respiratory distress. She has no wheezes. She has no rales.  Abdominal: Soft. Bowel sounds are normal. She exhibits no distension. There  is no tenderness. There is no rebound and no guarding.  Musculoskeletal: Normal range of motion.  Neurological: She is alert and oriented to person, place, and time.  Nl strength and sensation throughout. No pronator drift. However, falls to L side while ambulating.   Skin: Skin is warm and dry.   Psychiatric: She has a normal mood and affect. Her behavior is normal. Judgment and thought content normal.    ED Course  Procedures (including critical care time) Labs Review Labs Reviewed  CBC WITH DIFFERENTIAL - Abnormal; Notable for the following:    Neutrophils Relative % 84 (*)    All other components within normal limits  COMPREHENSIVE METABOLIC PANEL - Abnormal; Notable for the following:    Glucose, Bld 130 (*)    BUN 29 (*)    Creatinine, Ser 1.46 (*)    GFR calc non Af Amer 34 (*)    GFR calc Af Amer 40 (*)    All other components within normal limits   Imaging Review Ct Head Wo Contrast  09/04/2013   CLINICAL DATA:  Dizziness and vertigo which began earlier today.  EXAM: CT HEAD WITHOUT CONTRAST  TECHNIQUE: Contiguous axial images were obtained from the base of the skull through the vertex without intravenous contrast.  COMPARISON:  Multiple prior CT head examinations dating back to 11/01/2007, most recently 01/12/2009.  FINDINGS: Ventricular system normal in size and appearance for age. Mild cortical and cerebellar atrophy, unchanged. Mild changes of small vessel disease of the white matter, unchanged. No mass lesion. No midline shift. No acute hemorrhage or hematoma. No extra-axial fluid collections. No evidence of acute infarction.  No focal osseous abnormality involving the skull. Mild changes of hyperostosis frontalis interna. Visualized paranasal sinuses, bilateral mastoid air cells, and bilateral middle ear cavities well-aerated. Mild bilateral carotid siphon and vertebral artery atherosclerosis.  IMPRESSION: 1. No acute intracranial abnormality. 2. Stable mild cortical and cerebellar atrophy and stable mild chronic microvascular ischemic changes of the white matter.   Electronically Signed   By: Evangeline Dakin M.D.   On: 09/04/2013 15:52   Mr Brain Wo Contrast  09/04/2013   CLINICAL DATA:  Dizziness.  Ataxia.  EXAM: MRI HEAD WITHOUT CONTRAST  TECHNIQUE: Multiplanar,  multiecho pulse sequences of the brain and surrounding structures were obtained without intravenous contrast.  COMPARISON:  CT head 09/04/2013  FINDINGS: Image quality degraded by mild motion.  Patchy hyperintensities in the cerebral white matter bilaterally. Brainstem and cerebellum are intact. Small chronic infarct right thalamus  Negative for acute infarct.  Chronic micro hemorrhage right parietal cortex.  Negative for mass lesion.  Cervical fusion with artifact in the cervical spine. Anterior slip C3 on C4 is present.  IMPRESSION: Mild chronic microvascular ischemic change in the white matter. No acute infarct or mass.   Electronically Signed   By: Franchot Gallo M.D.   On: 09/04/2013 18:34    EKG Interpretation    Date/Time:  Friday September 04 2013 15:36:20 EST Ventricular Rate:  85 PR Interval:  166 QRS Duration: 79 QT Interval:  373 QTC Calculation: 443 R Axis:   -13 Text Interpretation:  Sinus rhythm Probable left atrial enlargement Abnormal R-wave progression, early transition Consider anterior infarct No significant change since last tracing Confirmed by Laquan Ludden  MD, Chinedu Agustin [phone]) on 09/04/2013 3:40:29 PM            MDM  No diagnosis found. [name] is a 72 y.o. female here with vertigo. I think it can be  BPPV. However, the ataxia is concerning for possible posterior stroke. Will likely need MRI for further evaluation.   7:00 PM I called Dr. Nicole Kindred, who recommend MRI. MRI showed no acute stroke. She feels better and is more steady. Will d/c home with prn meclizine.     Wandra Arthurs, MD 09/04/13 1901

## 2013-09-04 NOTE — ED Notes (Signed)
Bed: WA20 Expected date:  Expected time:  Means of arrival:  Comments: EMS-forgot complaint

## 2013-09-04 NOTE — ED Notes (Signed)
Patient transported to MRI 

## 2013-09-22 ENCOUNTER — Telehealth: Payer: Self-pay | Admitting: *Deleted

## 2013-09-22 DIAGNOSIS — M316 Other giant cell arteritis: Secondary | ICD-10-CM

## 2013-09-22 NOTE — Telephone Encounter (Signed)
Pt called requesting Sed Rate lab order.  Please advise

## 2013-09-22 NOTE — Telephone Encounter (Signed)
Order in.

## 2013-09-23 NOTE — Telephone Encounter (Signed)
Spoke with pt advised order placed. 

## 2013-09-25 ENCOUNTER — Other Ambulatory Visit (INDEPENDENT_AMBULATORY_CARE_PROVIDER_SITE_OTHER): Payer: Medicare Other

## 2013-09-25 DIAGNOSIS — M316 Other giant cell arteritis: Secondary | ICD-10-CM

## 2013-09-25 LAB — SEDIMENTATION RATE: SED RATE: 28 mm/h — AB (ref 0–22)

## 2013-09-27 ENCOUNTER — Telehealth: Payer: Self-pay | Admitting: Neurology

## 2013-09-27 NOTE — Telephone Encounter (Signed)
I called the patient. The ESR is now at 28, up from 25. I have asked the patient to go up to 15 mg daily of the prednisone.

## 2013-10-14 DIAGNOSIS — S32509A Unspecified fracture of unspecified pubis, initial encounter for closed fracture: Secondary | ICD-10-CM | POA: Diagnosis not present

## 2013-10-14 DIAGNOSIS — Z4789 Encounter for other orthopedic aftercare: Secondary | ICD-10-CM | POA: Diagnosis not present

## 2013-10-14 DIAGNOSIS — M545 Low back pain, unspecified: Secondary | ICD-10-CM | POA: Diagnosis not present

## 2013-10-14 DIAGNOSIS — M4000 Postural kyphosis, site unspecified: Secondary | ICD-10-CM | POA: Diagnosis not present

## 2013-10-14 DIAGNOSIS — Z981 Arthrodesis status: Secondary | ICD-10-CM | POA: Diagnosis not present

## 2013-10-14 DIAGNOSIS — I700 Atherosclerosis of aorta: Secondary | ICD-10-CM | POA: Diagnosis not present

## 2013-10-14 DIAGNOSIS — M542 Cervicalgia: Secondary | ICD-10-CM | POA: Diagnosis not present

## 2013-10-14 DIAGNOSIS — M4180 Other forms of scoliosis, site unspecified: Secondary | ICD-10-CM | POA: Diagnosis not present

## 2013-10-14 DIAGNOSIS — M439 Deforming dorsopathy, unspecified: Secondary | ICD-10-CM | POA: Diagnosis not present

## 2013-10-19 ENCOUNTER — Encounter: Payer: Self-pay | Admitting: Internal Medicine

## 2013-10-19 ENCOUNTER — Ambulatory Visit (INDEPENDENT_AMBULATORY_CARE_PROVIDER_SITE_OTHER): Payer: Medicare Other | Admitting: Internal Medicine

## 2013-10-19 VITALS — BP 96/60 | HR 85 | Temp 98.0°F | Wt 196.0 lb

## 2013-10-19 DIAGNOSIS — M4120 Other idiopathic scoliosis, site unspecified: Secondary | ICD-10-CM

## 2013-10-19 DIAGNOSIS — M316 Other giant cell arteritis: Secondary | ICD-10-CM

## 2013-10-19 DIAGNOSIS — N182 Chronic kidney disease, stage 2 (mild): Secondary | ICD-10-CM

## 2013-10-19 DIAGNOSIS — G8929 Other chronic pain: Secondary | ICD-10-CM

## 2013-10-19 DIAGNOSIS — E119 Type 2 diabetes mellitus without complications: Secondary | ICD-10-CM

## 2013-10-19 DIAGNOSIS — M419 Scoliosis, unspecified: Secondary | ICD-10-CM

## 2013-10-19 DIAGNOSIS — M810 Age-related osteoporosis without current pathological fracture: Secondary | ICD-10-CM

## 2013-10-19 DIAGNOSIS — IMO0002 Reserved for concepts with insufficient information to code with codable children: Secondary | ICD-10-CM

## 2013-10-19 DIAGNOSIS — I10 Essential (primary) hypertension: Secondary | ICD-10-CM

## 2013-10-19 DIAGNOSIS — M549 Dorsalgia, unspecified: Secondary | ICD-10-CM

## 2013-10-19 MED ORDER — OXYCODONE HCL 10 MG PO TABS: 10.0000 mg | ORAL_TABLET | ORAL | Status: DC | PRN

## 2013-10-19 MED ORDER — PREDNISONE 5 MG PO TABS: 5.0000 mg | ORAL_TABLET | Freq: Every day | ORAL | Status: DC

## 2013-10-19 MED ORDER — OXYCODONE HCL ER 10 MG PO T12A: 10.0000 mg | EXTENDED_RELEASE_TABLET | Freq: Two times a day (BID) | ORAL | Status: DC

## 2013-10-19 NOTE — Progress Notes (Signed)
Pre visit review using our clinic review tool, if applicable. No additional management support is needed unless otherwise documented below in the visit note. 

## 2013-10-19 NOTE — Progress Notes (Signed)
Subjective:    Patient ID: [name], female    DOB: 1941/02/18, 73 y.o.   MRN: [id number]  HPI [name] presents for follow up of kypho-scoliosis and significant spinal disease and pain medication refill. She has recently seen Dr. Harl Bowie for follow up - she is 1 year post op and has done well from a surgical perspective. Records reviewed. Dr. Harl Bowie has recommended PT to help correct her kyphosis and her mobility.  She is seeing Dr. Amil Amen and will begin therapy for her OA. Dr. Amil Amen has spoken with Dr. Servando Salina who has cleared his medication recommendations.  [name] has been followed by Dr. Floyde Parkins for her temporal arteritis and neuro issues. She has been doing well - her prednisone dose has been increased to 15 mg daily (new Rx for 5 mg prednisone done today.   She has had no adverse effects with oxycodone. No constipation, somnolence or change in mental acuity. She has been on a stable dose of medication.   Past Medical History  Diagnosis Date  . History of breast cancer   . Osteoporosis   . Idiopathic pulmonary fibrosis   . Bronchiectasis   . Vitamin D deficiency   . Hyperlipidemia   . Allergic rhinitis   . GERD (gastroesophageal reflux disease)   . Left knee DJD   . Anxiety   . Migraine   . Diverticulosis of colon   . Seizure disorder   . Temporal arteritis     Right eye blind, on steroids per [facility]/ Dr [name]  . CVA (cerebral infarction) 10/2007    Right thalamic   . PMR (polymyalgia rheumatica)   . Anemia   . Gait disorder   . Peripheral neuropathy   . HTN (hypertension)   . Type II or unspecified type diabetes mellitus without mention of complication, not stated as uncontrolled     2nd to steriods  . Renal insufficiency   . Previous back surgery 10/09/12  . Polyneuropathy in other diseases classified elsewhere 02/17/2013   Past Surgical History  Procedure Laterality Date  . Mastectomy    . Cholecystectomy    . Total knee arthroplasty        Left  . Tonsillectomy    . Cataract extraction      OS - Summer 2010  . Lumbar fusion  04/2010    W/Mechanical fixation - [facility]  . Spinal fusion  07/2010    T10-L2 interbody fusion / [facility]  . Colonoscopy  12/15/2011    Procedure: COLONOSCOPY;  Surgeon: [name], MD;  Location: [facility];  Service: Endoscopy;  Laterality: N/A;  . Carpal tunnel release Right    Family History  Problem Relation Age of Onset  . Brain cancer Mother   . Hypertension Mother   . Dementia Father   . Hypertension Father   . Breast cancer Sister   . Multiple sclerosis Sister    History   Social History  . Marital Status: Widowed    Spouse Name: N/A    Number of Children: 1  . Years of Education: 12+ coll.   Occupational History  . Land     retired   Social History Main Topics  . Smoking status: Never Smoker   . Smokeless tobacco: Never Used  . Alcohol Use: No     Comment: heavy drinker until 1995 - sobriety with AA  . Drug Use: No  . Sexual Activity: Not Currently  Other Topics Concern  . Not on file   Social History Narrative      Married - husband invalid with stroke - widowed in 2009. 1 chil. 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 ACP - not formerly discussed - wishes to be a full code.    Patient is right handed. Patient consumes tea 2-3  times daily.             Current Outpatient Prescriptions on File Prior to Visit  Medication Sig Dispense Refill  . amLODipine (NORVASC) 5 MG tablet Take 5 mg by mouth daily.       Marland Kitchen aspirin 81 MG tablet Take 81 mg by mouth. Twice per week on wed and sunday      . calcium carbonate (OS-CAL) 600 MG TABS Take 600 mg by mouth daily.       . cholecalciferol (VITAMIN D) 1000 UNITS tablet Take 1,000 Units by mouth daily.        . Cyanocobalamin (VITAMIN B-12 PO) Take 1 tablet by mouth daily.      Marland Kitchen denosumab (PROLIA) 60 MG/ML SOLN Inject 60 mg  into the skin every 6 (six) months.        . Diclofenac Sodium (VOLTAREN EX) Apply topically. Rub on both once daily      . ferrous sulfate 325 (65 FE) MG tablet Take 325 mg by mouth 2 (two) times daily.       . furosemide (LASIX) 40 MG tablet Take 40 mg by mouth daily.      Marland Kitchen gabapentin (NEURONTIN) 300 MG capsule Take 1 capsule (300 mg total) by mouth 4 (four) times daily.  360 capsule  3  . losartan (COZAAR) 50 MG tablet TAKE 1 TABLET DAILY  30 tablet  0  . Multiple Vitamins-Minerals (MULTIVITAMIN,TX-MINERALS) tablet Take 1 tablet by mouth daily.        . potassium chloride (K-DUR,KLOR-CON) 10 MEQ tablet Take 10 mEq by mouth daily.      . predniSONE (DELTASONE) 10 MG tablet TAKE 1 TABLET DAILY  90 tablet  2  . simvastatin (ZOCOR) 5 MG tablet Take 1 tablet (5 mg total) by mouth at bedtime.  90 tablet  3  . [DISCONTINUED] potassium chloride (K-DUR) 10 MEQ tablet Take 1 tablet (10 mEq total) by mouth 2 (two) times daily.  180 tablet  0   No current facility-administered medications on file prior to visit.    Review of Systems System review is negative for any constitutional, cardiac, pulmonary, GI or neuro symptoms or complaints other than as described in the HPI.     Objective:   Physical Exam Filed Vitals:   10/19/13 1449  BP: 96/60  Pulse: 85  Temp: 98 F (36.7 C)   Gen'l - overweight woman in no distress HEENT - C&S clear, periorbital edema is noted - mild Cor - RRR PUlm - Clear Neuro non-focal MSK - came in using her walker       Assessment & Plan:

## 2013-10-19 NOTE — Patient Instructions (Signed)
Good to see you.  Please work on your posture. We will help with a referral to physical therapy at [place] do seem to be medically stable in regard to your other problems.  Thank you for letting me help in your care.

## 2013-10-20 DIAGNOSIS — M316 Other giant cell arteritis: Secondary | ICD-10-CM | POA: Diagnosis not present

## 2013-10-20 DIAGNOSIS — M069 Rheumatoid arthritis, unspecified: Secondary | ICD-10-CM | POA: Diagnosis not present

## 2013-10-20 DIAGNOSIS — M79609 Pain in unspecified limb: Secondary | ICD-10-CM | POA: Diagnosis not present

## 2013-10-20 DIAGNOSIS — M1990 Unspecified osteoarthritis, unspecified site: Secondary | ICD-10-CM | POA: Diagnosis not present

## 2013-10-20 DIAGNOSIS — N189 Chronic kidney disease, unspecified: Secondary | ICD-10-CM | POA: Diagnosis not present

## 2013-10-21 NOTE — Assessment & Plan Note (Signed)
Last Sed Rate Jan '15 = 28. Prednisone was advanced to 15 mg daily by Dr. [name].

## 2013-10-21 NOTE — Assessment & Plan Note (Signed)
Recent follow up at the [facility] 1 year out from surgery by Dr. Harl Bowie. Report reviewed - she has done well. There is concern for back pain related to kypho-scoliosis and PT recommended.  Plan Referral to [facility]

## 2013-10-21 NOTE — Assessment & Plan Note (Signed)
BP Readings from Last 3 Encounters:  10/19/13 96/60  09/04/13 122/64  08/24/13 110/64   Generally good control on present regimen.

## 2013-10-21 NOTE — Assessment & Plan Note (Signed)
Renal function appears stable. She does follow with Dr. Arty Baumgartner.

## 2013-10-21 NOTE — Assessment & Plan Note (Signed)
Lab Results  Component Value Date   HGBA1C 5.9* 12/14/2011   Due for follow up lab with recommendations to follow.

## 2013-11-03 ENCOUNTER — Telehealth: Payer: Self-pay | Admitting: Internal Medicine

## 2013-11-03 DIAGNOSIS — IMO0002 Reserved for concepts with insufficient information to code with codable children: Secondary | ICD-10-CM

## 2013-11-03 NOTE — Telephone Encounter (Signed)
Referral entered 10/19/13 - please check with [facility] on the status of the referral order. Thanks

## 2013-11-03 NOTE — Telephone Encounter (Signed)
Pt called request referral for referral to physical therapy at [place]. Pt stated that she need this referral due to back surgery and trouble walking. Please advise.

## 2013-11-04 NOTE — Telephone Encounter (Signed)
Left detail massage inform pt that referral is in the system, [facility] will take care of it.

## 2013-11-18 ENCOUNTER — Telehealth: Payer: Self-pay | Admitting: *Deleted

## 2013-11-18 ENCOUNTER — Other Ambulatory Visit: Payer: Self-pay | Admitting: Internal Medicine

## 2013-11-18 NOTE — Telephone Encounter (Signed)
Patient phoned requesting refill for klor con-per MAR review, refill sent already.

## 2013-11-20 NOTE — Telephone Encounter (Signed)
Pt called to follow up the PT request. Spoke with [facility], order is not the system. Please put in another referral for PT. Pt request for this to be done ASAP because this is going on for about a month now.

## 2013-11-21 NOTE — Telephone Encounter (Signed)
Reviewed office visit note in EPIC for Oct 19, 2013 - under "orders placed that visit" PT referral was entered. That this hasn't been done is of concern - either an EPIC problem or a person problem. Will ask [facility] to look into it.  New order entered for amb ref pT

## 2013-11-23 ENCOUNTER — Telehealth: Payer: Self-pay

## 2013-11-23 ENCOUNTER — Ambulatory Visit (INDEPENDENT_AMBULATORY_CARE_PROVIDER_SITE_OTHER): Payer: Medicare Other | Admitting: *Deleted

## 2013-11-23 ENCOUNTER — Other Ambulatory Visit (INDEPENDENT_AMBULATORY_CARE_PROVIDER_SITE_OTHER): Payer: Medicare Other

## 2013-11-23 DIAGNOSIS — M948X9 Other specified disorders of cartilage, unspecified sites: Secondary | ICD-10-CM

## 2013-11-23 DIAGNOSIS — Z79899 Other long term (current) drug therapy: Secondary | ICD-10-CM

## 2013-11-23 MED ORDER — DENOSUMAB 60 MG/ML ~~LOC~~ SOLN
60.0000 mg | Freq: Once | SUBCUTANEOUS | Status: AC
  Administered 2013-11-23: 60 mg via SUBCUTANEOUS

## 2013-11-23 NOTE — Telephone Encounter (Signed)
Neoma Laming took care of this already, appt made for referral.

## 2013-11-23 NOTE — Telephone Encounter (Signed)
Lab called needing order to be placed as future

## 2013-11-24 ENCOUNTER — Other Ambulatory Visit: Payer: Medicare Other

## 2013-11-24 ENCOUNTER — Telehealth: Payer: Self-pay

## 2013-11-24 DIAGNOSIS — M948X9 Other specified disorders of cartilage, unspecified sites: Secondary | ICD-10-CM

## 2013-11-24 LAB — CALCIUM, IONIZED

## 2013-11-24 LAB — SEDIMENTATION RATE: Sed Rate: 26 mm/hr — ABNORMAL HIGH (ref 0–22)

## 2013-11-24 NOTE — Telephone Encounter (Signed)
Out lab called and needed calcium ionized order to be placed again since patient has to come back. Not enough was drawn the 1st time. Lab has already notified the patient.

## 2013-11-25 LAB — CALCIUM, IONIZED: CALCIUM ION: 1.24 mmol/L (ref 1.12–1.32)

## 2013-11-27 ENCOUNTER — Other Ambulatory Visit: Payer: Self-pay | Admitting: Internal Medicine

## 2013-11-30 DIAGNOSIS — M545 Low back pain, unspecified: Secondary | ICD-10-CM | POA: Diagnosis not present

## 2013-12-04 DIAGNOSIS — M545 Low back pain, unspecified: Secondary | ICD-10-CM | POA: Diagnosis not present

## 2013-12-08 DIAGNOSIS — M545 Low back pain, unspecified: Secondary | ICD-10-CM | POA: Diagnosis not present

## 2013-12-09 DIAGNOSIS — E669 Obesity, unspecified: Secondary | ICD-10-CM | POA: Diagnosis not present

## 2013-12-09 DIAGNOSIS — I129 Hypertensive chronic kidney disease with stage 1 through stage 4 chronic kidney disease, or unspecified chronic kidney disease: Secondary | ICD-10-CM | POA: Diagnosis not present

## 2013-12-09 DIAGNOSIS — N183 Chronic kidney disease, stage 3 unspecified: Secondary | ICD-10-CM | POA: Diagnosis not present

## 2013-12-09 DIAGNOSIS — D649 Anemia, unspecified: Secondary | ICD-10-CM | POA: Diagnosis not present

## 2013-12-09 DIAGNOSIS — R609 Edema, unspecified: Secondary | ICD-10-CM | POA: Diagnosis not present

## 2013-12-11 DIAGNOSIS — M545 Low back pain, unspecified: Secondary | ICD-10-CM | POA: Diagnosis not present

## 2013-12-14 DIAGNOSIS — M545 Low back pain, unspecified: Secondary | ICD-10-CM | POA: Diagnosis not present

## 2013-12-17 DIAGNOSIS — M545 Low back pain, unspecified: Secondary | ICD-10-CM | POA: Diagnosis not present

## 2013-12-29 DIAGNOSIS — M545 Low back pain, unspecified: Secondary | ICD-10-CM | POA: Diagnosis not present

## 2014-01-01 DIAGNOSIS — M545 Low back pain, unspecified: Secondary | ICD-10-CM | POA: Diagnosis not present

## 2014-01-05 DIAGNOSIS — M545 Low back pain, unspecified: Secondary | ICD-10-CM | POA: Diagnosis not present

## 2014-01-08 DIAGNOSIS — M545 Low back pain, unspecified: Secondary | ICD-10-CM | POA: Diagnosis not present

## 2014-01-12 DIAGNOSIS — M545 Low back pain, unspecified: Secondary | ICD-10-CM | POA: Diagnosis not present

## 2014-01-13 ENCOUNTER — Telehealth: Payer: Self-pay | Admitting: Family Medicine

## 2014-01-13 ENCOUNTER — Encounter: Payer: Self-pay | Admitting: Family Medicine

## 2014-01-13 ENCOUNTER — Ambulatory Visit (INDEPENDENT_AMBULATORY_CARE_PROVIDER_SITE_OTHER): Payer: Medicare Other | Admitting: Family Medicine

## 2014-01-13 VITALS — BP 120/80 | HR 86 | Temp 98.6°F | Wt 200.0 lb

## 2014-01-13 DIAGNOSIS — M316 Other giant cell arteritis: Secondary | ICD-10-CM

## 2014-01-13 DIAGNOSIS — E785 Hyperlipidemia, unspecified: Secondary | ICD-10-CM | POA: Diagnosis not present

## 2014-01-13 DIAGNOSIS — E119 Type 2 diabetes mellitus without complications: Secondary | ICD-10-CM | POA: Diagnosis not present

## 2014-01-13 DIAGNOSIS — I10 Essential (primary) hypertension: Secondary | ICD-10-CM

## 2014-01-13 DIAGNOSIS — G63 Polyneuropathy in diseases classified elsewhere: Secondary | ICD-10-CM

## 2014-01-13 DIAGNOSIS — M810 Age-related osteoporosis without current pathological fracture: Secondary | ICD-10-CM

## 2014-01-13 DIAGNOSIS — N182 Chronic kidney disease, stage 2 (mild): Secondary | ICD-10-CM

## 2014-01-13 LAB — LIPID PANEL
Cholesterol: 154 mg/dL (ref 0–200)
HDL: 47.1 mg/dL (ref >39.00)
LDL Cholesterol: 84 mg/dL (ref 0–99)
Total CHOL/HDL Ratio: 3
Triglycerides: 116 mg/dL (ref 0.0–149.0)
VLDL: 23.2 mg/dL (ref 0.0–40.0)

## 2014-01-13 LAB — BASIC METABOLIC PANEL
BUN: 32 mg/dL — ABNORMAL HIGH (ref 6–23)
CO2: 29 mEq/L (ref 19–32)
Calcium: 8.8 mg/dL (ref 8.4–10.5)
Chloride: 103 mEq/L (ref 96–112)
Creatinine, Ser: 1.5 mg/dL — ABNORMAL HIGH (ref 0.4–1.2)
GFR: 35.03 mL/min — ABNORMAL LOW (ref >60.00)
Glucose, Bld: 118 mg/dL — ABNORMAL HIGH (ref 70–99)
Potassium: 4.7 mEq/L (ref 3.5–5.1)
Sodium: 141 mEq/L (ref 135–145)

## 2014-01-13 LAB — HEMOGLOBIN A1C: Hgb A1c MFr Bld: 5.6 % (ref 4.6–6.5)

## 2014-01-13 LAB — HEPATIC FUNCTION PANEL
ALK PHOS: 66 U/L (ref 39–117)
ALT: 23 U/L (ref 0–35)
AST: 23 U/L (ref 0–37)
Albumin: 3.8 g/dL (ref 3.5–5.2)
Bilirubin, Direct: 0.1 mg/dL (ref 0.0–0.3)
Total Bilirubin: 1.1 mg/dL (ref 0.3–1.2)
Total Protein: 7.2 g/dL (ref 6.0–8.3)

## 2014-01-13 LAB — SEDIMENTATION RATE: SED RATE: 46 mm/h — AB (ref 0–22)

## 2014-01-13 MED ORDER — OXYCODONE HCL 10 MG PO TABS: 10.0000 mg | ORAL_TABLET | ORAL | Status: DC | PRN

## 2014-01-13 MED ORDER — OXYCODONE HCL ER 10 MG PO T12A: 10.0000 mg | EXTENDED_RELEASE_TABLET | Freq: Two times a day (BID) | ORAL | Status: DC

## 2014-01-13 NOTE — Progress Notes (Signed)
Pre visit review using our clinic review tool, if applicable. No additional management support is needed unless otherwise documented below in the visit note. 

## 2014-01-13 NOTE — Telephone Encounter (Signed)
Relevant patient education assigned to patient using Emmi. ° °

## 2014-01-13 NOTE — Progress Notes (Signed)
Subjective:    Patient ID: [name], female    DOB: June 09, 1941, 73 y.o.   MRN: [id number]  HPI Patient seen to establish care.  She has complicated past medical history. She has seen multiple specials over years. Her chronic problems include history of reported type 2 diabetes (though she was unaware of this dx), history of polyneuropathy, hyperlipidemia, hypertension, pulmonary fibrosis, GERD, osteoporosis, seizure disorder, history of breast cancer, history of CVA x2, temporal arteritis, chronic low back pain with multiple prior surgeries.  She is followed by rheumatology and neurology regarding her temporal arteritis and she needs followup sedimentation rate today. Symptomatically stable.  She has chronic low back pain and takes OxyContin twice daily and oxycodone for breakthrough pain. She is requesting refills of these medications today. Her pain has been recently well controlled. She is currently having physical therapy and apparently has some either leg length discrepancy or tilt in her pelvis and therapist has recommended heel lift on the left and she needs prescription for that.  She has frequent loose stools after meals. This is basically occurring early in the morning. No abdominal pain. No bloody stools. She had cholecystectomy back in 2009  Osteoporosis and she receives Prolia injections every 6 months.  She takes regular calcium and vitamin D supplementation  Hyperlipidemia treated with low-dose simvastatin. She has not had lipids in over one year. She does not have any history of CAD or peripheral vascular disease. Her neuropathy pain is treated with gabapentin. Hypertension treated with losartan and amlodipine. She also takes furosemide  Past Medical History  Diagnosis Date  . History of breast cancer   . Osteoporosis   . Idiopathic pulmonary fibrosis   . Bronchiectasis   . Vitamin D deficiency   . Hyperlipidemia   . Allergic rhinitis   . GERD (gastroesophageal  reflux disease)   . Left knee DJD   . Anxiety   . Migraine   . Diverticulosis of colon   . Seizure disorder   . Temporal arteritis     Right eye blind, on steroids per [facility]/ Dr [name]  . CVA (cerebral infarction) 10/2007    Right thalamic   . PMR (polymyalgia rheumatica)   . Anemia   . Gait disorder   . Peripheral neuropathy   . HTN (hypertension)   . Type II or unspecified type diabetes mellitus without mention of complication, not stated as uncontrolled     2nd to steriods  . Renal insufficiency   . Previous back surgery 10/09/12  . Polyneuropathy in other diseases classified elsewhere 02/17/2013   Past Surgical History  Procedure Laterality Date  . Mastectomy    . Cholecystectomy    . Total knee arthroplasty      Left  . Tonsillectomy    . Cataract extraction      OS - Summer 2010  . Lumbar fusion  04/2010    W/Mechanical fixation - [facility]  . Spinal fusion  07/2010    T10-L2 interbody fusion / [facility]  . Colonoscopy  12/15/2011    Procedure: COLONOSCOPY;  Surgeon: [name], MD;  Location: [facility];  Service: Endoscopy;  Laterality: N/A;  . Carpal tunnel release Right     reports that she has never smoked. She has never used smokeless tobacco. She reports that she does not drink alcohol or use illicit drugs. family history includes Brain cancer in her mother; Breast cancer in her sister; Dementia in her father; Hypertension in her father and  mother; Multiple sclerosis in her sister. Allergies  Allergen Reactions  . Hydromorphone Other (See Comments)    Cognitive changes   . Neurontin [Gabapentin]     sleepy      Review of Systems  Constitutional: Positive for fatigue.  HENT: Negative for trouble swallowing.   Eyes: Negative for visual disturbance.  Respiratory: Negative for cough, chest tightness, shortness of breath and wheezing.   Cardiovascular: Negative for chest pain, palpitations and leg swelling.  Gastrointestinal: Positive  for diarrhea. Negative for nausea and vomiting.  Endocrine: Negative for polydipsia and polyuria.  Genitourinary: Negative for hematuria.  Musculoskeletal: Positive for arthralgias and back pain.  Neurological: Negative for dizziness, seizures, syncope, weakness, light-headedness and headaches.  Psychiatric/Behavioral: Negative for dysphoric mood.       Objective:   Physical Exam  Constitutional: She appears well-developed and well-nourished.  Neck: Neck supple. No thyromegaly present.  Cardiovascular: Normal rate.  Exam reveals no gallop.   Pulmonary/Chest: Effort normal and breath sounds normal. No respiratory distress. She has no wheezes. She has no rales.  Musculoskeletal: She exhibits no edema.  Lymphadenopathy:    She has no cervical adenopathy.  Neurological: She is alert.  Psychiatric: She has a normal mood and affect. Her behavior is normal.          Assessment & Plan:  #1 reported type 2 diabetes. Patient was unaware of diagnosis. Repeat sedimentation rate. Previous A1c's have been well controlled. Probably prednisone-induced #2 history of temporal arteritis. Repeat sedimentation rate and fax results to her neurologist. She remains on prednisone 15 mg daily #3 history of chronic back pain. She's had multiple surgeries the past refill her oxycodone and OxyContin.  RX for left heel lift. #4 hyperlipidemia. Check lipid and hepatic panel #5 chronic kidney disease. Check basic metabolic panel. Avoid non-steroidals #6 intermittent loose stools. Possibly related to previous cholecystectomy. She'll try fiber supplement first. She's not getting relief with that consider supplementation with cholestyramine #7 osteoporosis. Continue regular vitamin D and calcium. She gets every 6 month Prolia injections

## 2014-01-14 ENCOUNTER — Telehealth: Payer: Self-pay | Admitting: Neurology

## 2014-01-14 NOTE — Telephone Encounter (Signed)
I called patient. The patient has had an elevation in sedimentation rate went from 26-46. The patient has had headaches in the frontal areas over the last 2 weeks. We will go up on the prednisone taking 20 mg a day. We will recheck a sedimentation rate in one month, but the headaches do not abate, a CT of the sinuses will be done to rule out sinusitis.

## 2014-01-15 DIAGNOSIS — M545 Low back pain, unspecified: Secondary | ICD-10-CM | POA: Diagnosis not present

## 2014-01-18 ENCOUNTER — Telehealth: Payer: Self-pay | Admitting: Family Medicine

## 2014-01-18 DIAGNOSIS — M1990 Unspecified osteoarthritis, unspecified site: Secondary | ICD-10-CM | POA: Diagnosis not present

## 2014-01-18 DIAGNOSIS — M069 Rheumatoid arthritis, unspecified: Secondary | ICD-10-CM | POA: Diagnosis not present

## 2014-01-18 DIAGNOSIS — N189 Chronic kidney disease, unspecified: Secondary | ICD-10-CM | POA: Diagnosis not present

## 2014-01-18 DIAGNOSIS — M316 Other giant cell arteritis: Secondary | ICD-10-CM | POA: Diagnosis not present

## 2014-01-18 DIAGNOSIS — M79609 Pain in unspecified limb: Secondary | ICD-10-CM | POA: Diagnosis not present

## 2014-01-18 MED ORDER — AMLODIPINE BESYLATE 5 MG PO TABS: 5.0000 mg | ORAL_TABLET | Freq: Every day | ORAL | Status: DC

## 2014-01-18 NOTE — Telephone Encounter (Signed)
RX sent to pharmacy  

## 2014-01-18 NOTE — Telephone Encounter (Signed)
[facility] is requesting re-fill on amLODipine (NORVASC) 5 MG tablet

## 2014-01-19 DIAGNOSIS — M545 Low back pain, unspecified: Secondary | ICD-10-CM | POA: Diagnosis not present

## 2014-01-20 ENCOUNTER — Telehealth: Payer: Self-pay | Admitting: Family Medicine

## 2014-01-20 MED ORDER — SIMVASTATIN 5 MG PO TABS: 5.0000 mg | ORAL_TABLET | Freq: Every day | ORAL | Status: DC

## 2014-01-20 MED ORDER — POTASSIUM CHLORIDE CRYS ER 10 MEQ PO TBCR: EXTENDED_RELEASE_TABLET | ORAL | Status: DC

## 2014-01-20 MED ORDER — PREDNISONE 10 MG PO TABS: ORAL_TABLET | ORAL | Status: DC

## 2014-01-20 MED ORDER — FUROSEMIDE 40 MG PO TABS: 40.0000 mg | ORAL_TABLET | Freq: Every day | ORAL | Status: DC

## 2014-01-20 NOTE — Telephone Encounter (Signed)
Please add the following to re-fill request:  KLOR-CON M10 10 MEQ tablet simvastatin (ZOCOR) 5 MG tablet predniSONE (DELTASONE) 10 MG tablet

## 2014-01-20 NOTE — Telephone Encounter (Signed)
[facility] is requesting re-fill on furosemide (LASIX) 40 MG tablet

## 2014-01-20 NOTE — Telephone Encounter (Signed)
Rx sent to [facility]

## 2014-01-22 ENCOUNTER — Telehealth: Payer: Self-pay

## 2014-01-22 NOTE — Telephone Encounter (Signed)
Relevant patient education assigned to patient using Emmi. ° °

## 2014-01-31 IMAGING — CR DG CHEST 2V
2 series · 2 of 2 positions shown · non-contrast
Comparison: Multiple priors

CLINICAL DATA: Shortness of breath. Rales at both lung bases.
Question pulmonary edema.

EXAM:
CHEST  2 VIEW

[view not recorded (1 of 2)]
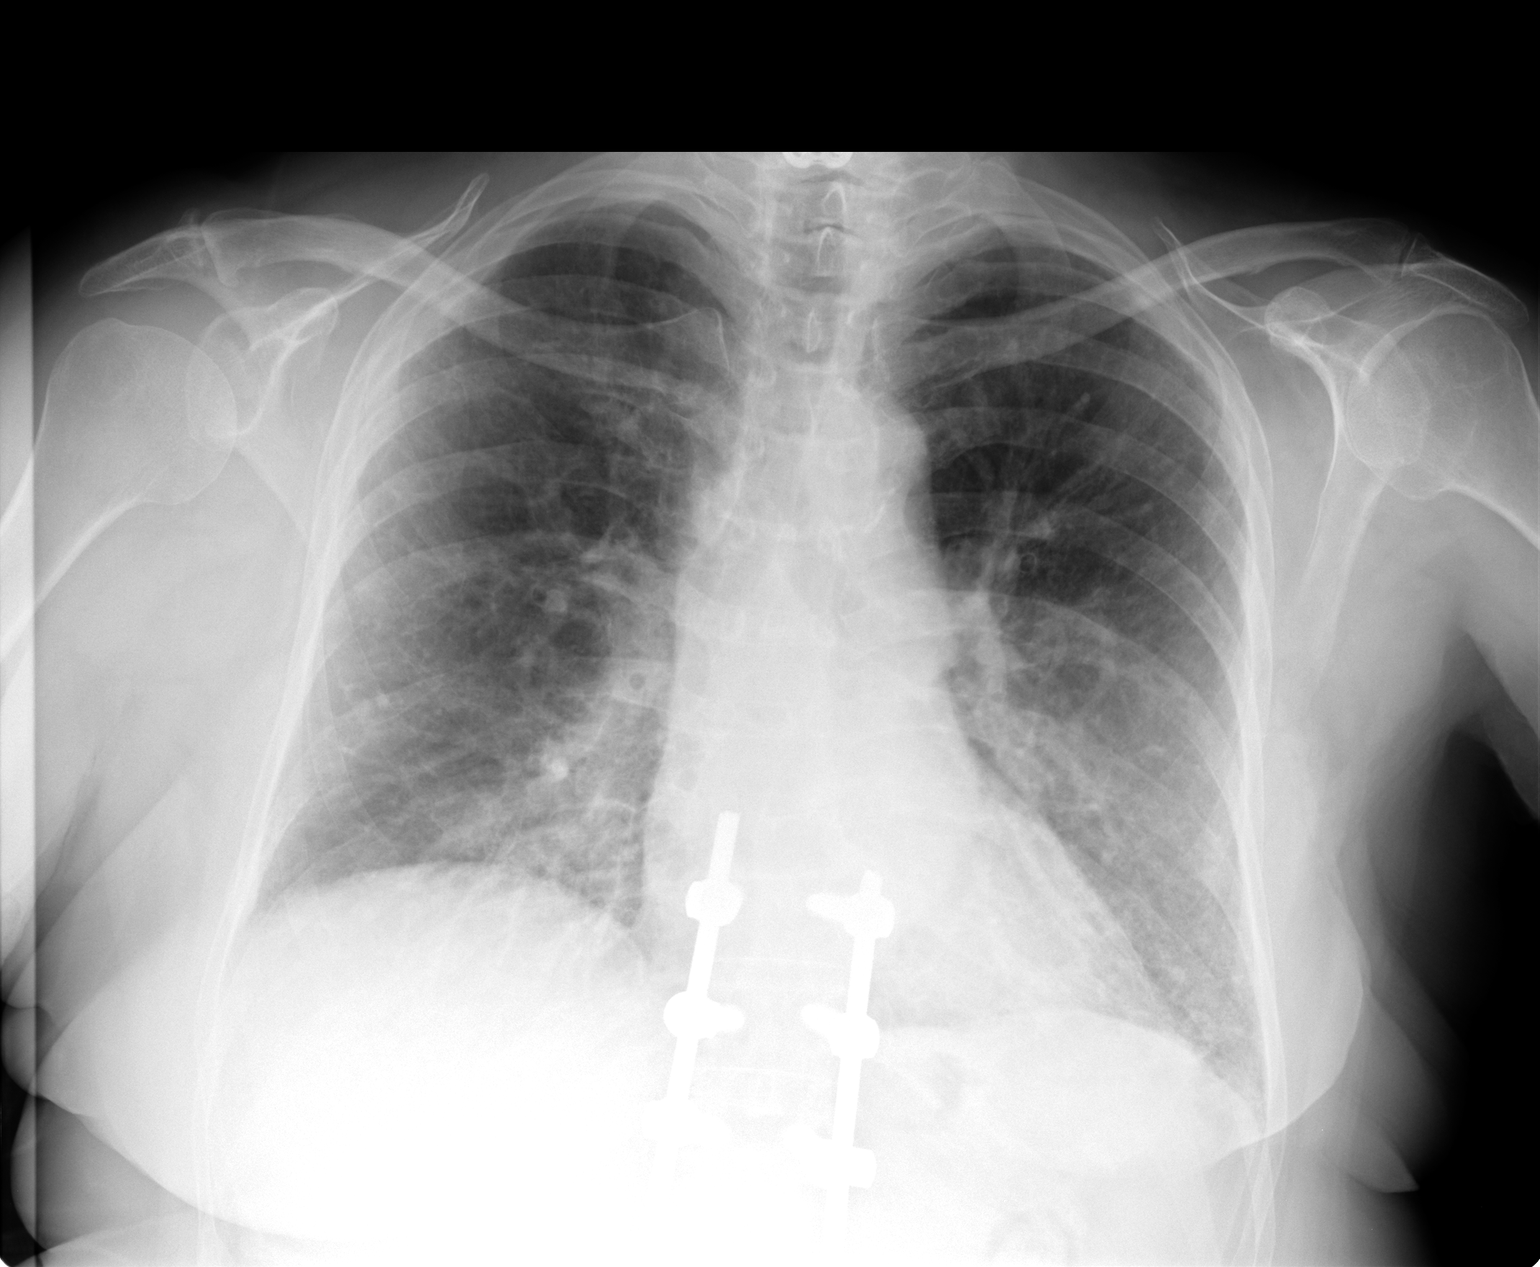

[view not recorded (2 of 2)]
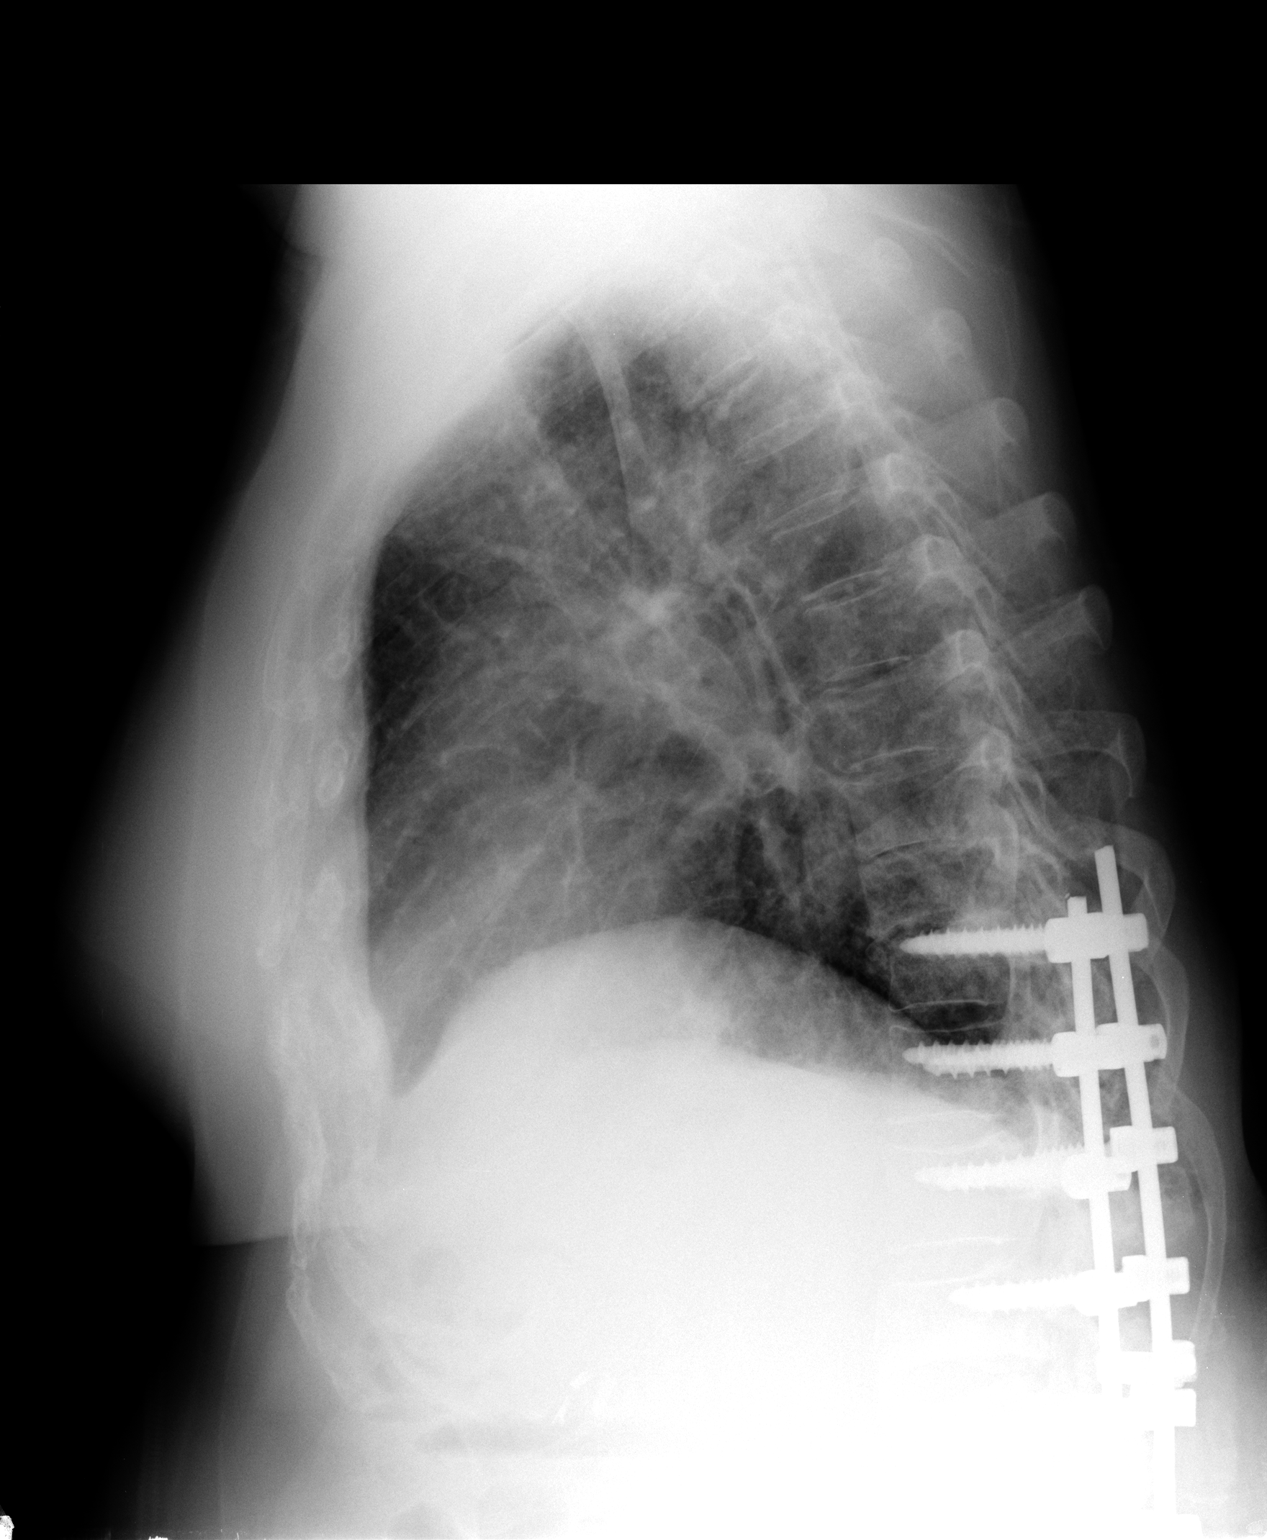

[2 of 2 positions shown; findings below may reference images not displayed]

FINDINGS: The cardiomediastinal silhouette is unchanged. Again noted is
bilateral increased interstitial markings, which are chronic.
Bibasilar opacities are identified. No pleural effusion or
pneumothorax. There is 4 mm anterolisthesis of T10 on T11. Again
noted is spinal fixation hardware extending from T11 through the
visualized upper to mid lumbar spine.
IMPRESSION: 1. Bibasilar opacities, representing atelectasis or infiltrate.

2. Chronic prominence of the interstitial markings.

3. There is 4mm anterolisthesis of T10 on T11, with spinal fusion
hardware seen at T11 through the visualized upper to mid lumbar
spine.

## 2014-02-10 DIAGNOSIS — M069 Rheumatoid arthritis, unspecified: Secondary | ICD-10-CM | POA: Diagnosis not present

## 2014-02-16 ENCOUNTER — Other Ambulatory Visit (INDEPENDENT_AMBULATORY_CARE_PROVIDER_SITE_OTHER): Payer: Self-pay

## 2014-02-16 ENCOUNTER — Telehealth: Payer: Self-pay | Admitting: Neurology

## 2014-02-16 ENCOUNTER — Other Ambulatory Visit: Payer: Self-pay | Admitting: *Deleted

## 2014-02-16 DIAGNOSIS — Z5181 Encounter for therapeutic drug level monitoring: Secondary | ICD-10-CM

## 2014-02-16 DIAGNOSIS — Z0289 Encounter for other administrative examinations: Secondary | ICD-10-CM

## 2014-02-16 LAB — SEDIMENTATION RATE: Sed Rate: 18 mm/hr (ref 0–40)

## 2014-02-16 NOTE — Addendum Note (Signed)
Addended byOliver Hum on: 02/16/2014 10:10 AM   Modules accepted: Orders

## 2014-02-16 NOTE — Telephone Encounter (Signed)
Message copied by Margette Fast on Tue Feb 16, 2014  8:07 AM ------      Message from: Margette Fast      Created: Thu Jan 14, 2014  8:43 AM       Recheck sedimentation rate ------

## 2014-02-16 NOTE — Telephone Encounter (Signed)
I called patient. A sedimentation rate. The low good at this point. We will keep her at 20 mg daily on prednisone.

## 2014-02-16 NOTE — Telephone Encounter (Signed)
I called patient. The patient had an elevated sedimentation rate one month ago, prednisone dose was increased, we will recheck the sedimentation rate.

## 2014-02-16 NOTE — Progress Notes (Signed)
Order redone since needed future status.

## 2014-02-17 ENCOUNTER — Telehealth: Payer: Self-pay | Admitting: Family Medicine

## 2014-02-17 MED ORDER — LOSARTAN POTASSIUM 50 MG PO TABS: ORAL_TABLET | ORAL | Status: DC

## 2014-02-17 NOTE — Telephone Encounter (Signed)
Pt stated dr Linda Hedges put her on losartan 50 mg once a day  #90. Pt needs [facility] w/refills

## 2014-02-17 NOTE — Telephone Encounter (Signed)
[facility]

## 2014-02-24 ENCOUNTER — Ambulatory Visit: Payer: Medicare Other | Admitting: Neurology

## 2014-03-03 ENCOUNTER — Encounter: Payer: Self-pay | Admitting: Neurology

## 2014-03-08 DIAGNOSIS — H00019 Hordeolum externum unspecified eye, unspecified eyelid: Secondary | ICD-10-CM | POA: Diagnosis not present

## 2014-03-08 DIAGNOSIS — H4720 Unspecified optic atrophy: Secondary | ICD-10-CM | POA: Diagnosis not present

## 2014-03-08 DIAGNOSIS — M316 Other giant cell arteritis: Secondary | ICD-10-CM | POA: Diagnosis not present

## 2014-03-08 DIAGNOSIS — Z961 Presence of intraocular lens: Secondary | ICD-10-CM | POA: Diagnosis not present

## 2014-03-09 ENCOUNTER — Telehealth: Payer: Self-pay | Admitting: Neurology

## 2014-03-09 ENCOUNTER — Telehealth: Payer: Self-pay | Admitting: Family Medicine

## 2014-03-09 NOTE — Telephone Encounter (Signed)
Pt informed

## 2014-03-09 NOTE — Telephone Encounter (Signed)
Pt would like cb w/ results of labs done over a week ago, specifically, sed rate and creatine level

## 2014-03-09 NOTE — Telephone Encounter (Signed)
Pt called states she wants to see if it is possible that she can reduce the dosage on her predniSONE (DELTASONE) 10 MG tablet, pt states she takes 15 mg.  Also pt is having problems with her feet numb, sting,sharp pains all the time. Pt has an apt on 03/22/14 with NP/MM.  Please call pt back concerning this matter.

## 2014-03-09 NOTE — Telephone Encounter (Signed)
I called the patient, left a message. The patient apparently is on 15 mg daily of prednisone. She indicates that she is having some numbness in the feet, could be developing a neuropathy. The last sedimentation rate end of May was normal, she'll be seen at the end of this month, will plan on checking a sedimentation rate at that time and if this is unremarkable, will drop her to 12.5 mg of prednisone daily.

## 2014-03-09 NOTE — Telephone Encounter (Signed)
Patient left message with front desk saying she would like a dose decrease on Prednisone and she is having sharp pains, stinging and numbness in her feet.  I tried to call the patient back to obtain more info, but got no answer.  She does have an appt scheduled later this month.  Please advise.  Thank you.

## 2014-03-09 NOTE — Telephone Encounter (Signed)
Last sed rate was with neurologist and normal at 18.  Last creatinine stable at 1.5.

## 2014-03-11 DIAGNOSIS — M069 Rheumatoid arthritis, unspecified: Secondary | ICD-10-CM | POA: Diagnosis not present

## 2014-03-16 ENCOUNTER — Ambulatory Visit: Payer: Medicare Other | Admitting: Neurology

## 2014-03-18 ENCOUNTER — Ambulatory Visit: Payer: Medicare Other | Admitting: Adult Health

## 2014-03-18 DIAGNOSIS — M1990 Unspecified osteoarthritis, unspecified site: Secondary | ICD-10-CM | POA: Diagnosis not present

## 2014-03-18 DIAGNOSIS — M316 Other giant cell arteritis: Secondary | ICD-10-CM | POA: Diagnosis not present

## 2014-03-18 DIAGNOSIS — N189 Chronic kidney disease, unspecified: Secondary | ICD-10-CM | POA: Diagnosis not present

## 2014-03-18 DIAGNOSIS — M069 Rheumatoid arthritis, unspecified: Secondary | ICD-10-CM | POA: Diagnosis not present

## 2014-03-19 ENCOUNTER — Telehealth: Payer: Self-pay | Admitting: Neurology

## 2014-03-19 NOTE — Telephone Encounter (Signed)
Blood work was done by Dr. Amil Amen. The sedimentation rate on June 18 was 6, a comprehensive metabolic profile done with a BUN/creatinine of 32/2, otherwise chemistries were normal. A CBC showed a white count of 11.9, hemoglobin of 14.5, MCV of 101.1. She will be seen at the end of the month through this office.

## 2014-03-22 ENCOUNTER — Encounter: Payer: Self-pay | Admitting: Adult Health

## 2014-03-22 ENCOUNTER — Ambulatory Visit (INDEPENDENT_AMBULATORY_CARE_PROVIDER_SITE_OTHER): Payer: Medicare Other | Admitting: Adult Health

## 2014-03-22 VITALS — BP 116/67 | HR 91 | Ht 63.0 in | Wt 198.0 lb

## 2014-03-22 DIAGNOSIS — M316 Other giant cell arteritis: Secondary | ICD-10-CM | POA: Diagnosis not present

## 2014-03-22 DIAGNOSIS — IMO0002 Reserved for concepts with insufficient information to code with codable children: Secondary | ICD-10-CM | POA: Diagnosis not present

## 2014-03-22 DIAGNOSIS — G629 Polyneuropathy, unspecified: Secondary | ICD-10-CM

## 2014-03-22 DIAGNOSIS — G609 Hereditary and idiopathic neuropathy, unspecified: Secondary | ICD-10-CM

## 2014-03-22 MED ORDER — GABAPENTIN 300 MG PO CAPS: ORAL_CAPSULE | ORAL | Status: DC

## 2014-03-22 NOTE — Progress Notes (Signed)
I have read the note, and I agree with the clinical assessment and plan.  WILLIS,CHARLES KEITH   

## 2014-03-22 NOTE — Progress Notes (Signed)
PATIENT: [name] DOB: 1941/05/10  REASON FOR VISIT: follow up HISTORY FROM: patient  HISTORY OF PRESENT ILLNESS: [name] is a 73 year old female with a history of temporal arteritis. She returns today for follow-up. The patent is currently on 15 mg prednisone. She states that she has not had any headaches but does have some temporal tenderness at times but not recently. Patient also has peripheral neuropathy and takes gabapentin for the discomfort. Patient has a left sided lumbosacral radiculopathy that has caused a left footdrop. The patient uses a walker when ambulating. She states that the discomfort in her legs has worsened. She has numbness and tingling that is much worse at night. She is not sure if the gabapentin is helping but states that it might be much worse without it. She has had three back surgeries in the past. She followed up with her neurosurgeon in January and she states that the doctor told her that the "last 3 levels had shifted about 49mm." Patient also reports that she feels drowsy throughout the day. She takes 2 tablets of the gabapentin in the morning and before bed as well as schedule OxyContin twice a day. She returns today for an evaluation.   REVIEW OF SYSTEMS: Full 14 system review of systems performed and notable only for:  Constitutional: N/A  Eyes: N/A Ear/Nose/Throat: N/A  Skin: N/A  Cardiovascular: N/A  Respiratory: N/A  Gastrointestinal: N/A  Genitourinary: N/A Hematology/Lymphatic: N/A  Endocrine: N/A Musculoskeletal:N/A  Allergy/Immunology: N/A  Neurological: N/A Psychiatric: N/A Sleep: N/A   ALLERGIES: Allergies  Allergen Reactions  . Hydromorphone Other (See Comments)    Cognitive changes     HOME MEDICATIONS: Outpatient Prescriptions Prior to Visit  Medication Sig Dispense Refill  . amLODipine (NORVASC) 5 MG tablet Take 1 tablet (5 mg total) by mouth daily.  90 tablet  3  . calcium carbonate (OS-CAL) 600 MG TABS Take 600 mg  by mouth daily.       . cholecalciferol (VITAMIN D) 1000 UNITS tablet Take 1,000 Units by mouth daily.        Marland Kitchen denosumab (PROLIA) 60 MG/ML SOLN Inject 60 mg into the skin every 6 (six) months.        . ferrous sulfate 325 (65 FE) MG tablet Take 325 mg by mouth 2 (two) times daily.       . furosemide (LASIX) 40 MG tablet Take 1 tablet (40 mg total) by mouth daily.  90 tablet  3  . gabapentin (NEURONTIN) 300 MG capsule Take 1 capsule (300 mg total) by mouth 4 (four) times daily.  360 capsule  3  . losartan (COZAAR) 50 MG tablet TAKE 1 TABLET DAILY  90 tablet  2  . Multiple Vitamins-Minerals (MULTIVITAMIN,TX-MINERALS) tablet Take 1 tablet by mouth daily.        . OxyCODONE (OXYCONTIN) 10 mg T12A 12 hr tablet Take 10 mg by mouth every 12 (twelve) hours. May refill on or after March 15, 2014      . Oxycodone HCl 10 MG TABS Take 1 tablet (10 mg total) by mouth every 4 (four) hours as needed (breakthrough pain.). Breakthrough pain.  180 tablet  0  . potassium chloride (K-DUR,KLOR-CON) 10 MEQ tablet Take 10 mEq by mouth daily.      . predniSONE (DELTASONE) 10 MG tablet TAKE 1 TABLET DAILY  90 tablet  3  . predniSONE (DELTASONE) 5 MG tablet Take 1 tablet (5 mg total) by mouth daily with breakfast.  90 tablet  3  . simvastatin (ZOCOR) 5 MG tablet Take 1 tablet (5 mg total) by mouth at bedtime.  90 tablet  3  . aspirin 81 MG tablet Take 81 mg by mouth. Twice per week on wed and sunday      . OxyCODONE (OXYCONTIN) 10 mg T12A 12 hr tablet Take 1 tablet (10 mg total) by mouth every 12 (twelve) hours.  60 tablet  0  . Cyanocobalamin (VITAMIN B-12 PO) Take 1 tablet by mouth daily.      . Diclofenac Sodium (VOLTAREN EX) Apply topically. Rub on both once daily      . OxyCODONE (OXYCONTIN) 10 mg T12A 12 hr tablet Take 10 mg by mouth every 12 (twelve) hours. May refill on or after Feb 12, 2014      . potassium chloride (KLOR-CON M10) 10 MEQ tablet TAKE 1 TABLET TWICE A DAY  180 tablet  3   No facility-administered  medications prior to visit.    PAST MEDICAL HISTORY: Past Medical History  Diagnosis Date  . History of breast cancer   . Osteoporosis   . Idiopathic pulmonary fibrosis   . Bronchiectasis   . Vitamin D deficiency   . Hyperlipidemia   . Allergic rhinitis   . GERD (gastroesophageal reflux disease)   . Left knee DJD   . Anxiety   . Migraine   . Diverticulosis of colon   . Seizure disorder   . Temporal arteritis     Right eye blind, on steroids per [facility]/ Dr [name]  . CVA (cerebral infarction) 10/2007    Right thalamic   . PMR (polymyalgia rheumatica)   . Anemia   . Gait disorder   . Peripheral neuropathy   . HTN (hypertension)   . Type II or unspecified type diabetes mellitus without mention of complication, not stated as uncontrolled     2nd to steriods  . Renal insufficiency   . Previous back surgery 10/09/12  . Polyneuropathy in other diseases classified elsewhere 02/17/2013    PAST SURGICAL HISTORY: Past Surgical History  Procedure Laterality Date  . Mastectomy    . Cholecystectomy    . Total knee arthroplasty      Left  . Tonsillectomy    . Cataract extraction      OS - Summer 2010  . Lumbar fusion  04/2010    W/Mechanical fixation - [facility]  . Spinal fusion  07/2010    T10-L2 interbody fusion / [facility]  . Colonoscopy  12/15/2011    Procedure: COLONOSCOPY;  Surgeon: [name] Craver, MD;  Location: [facility];  Service: Endoscopy;  Laterality: N/A;  . Carpal tunnel release Right     FAMILY HISTORY: Family History  Problem Relation Age of Onset  . Brain cancer Mother   . Hypertension Mother   . Dementia Father   . Hypertension Father   . Breast cancer Sister   . Multiple sclerosis Sister     SOCIAL HISTORY: History   Social History  . Marital Status: Widowed    Spouse Name: N/A    Number of Children: 1  . Years of Education: 12+ coll.   Occupational History  . Land     retired    Social History Main Topics  . Smoking status: Never Smoker   . Smokeless tobacco: Never Used  . Alcohol Use: No     Comment: heavy drinker until 1995 - sobriety with AA  . Drug Use:  No  . Sexual Activity: Not Currently   Other Topics Concern  . Not on file   Social History Narrative      Married - husband invalid with stroke - widowed in 2009. 1 chil. 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 ACP - not formerly discussed - wishes to be a full code.    Patient is right handed. Patient consumes tea 2-3  times daily.               PHYSICAL EXAM  Filed Vitals:   03/22/14 1006  BP: 116/67  Pulse: 91  Height: 5\' 3"  (1.6 m)  Weight: 198 lb (89.812 kg)   Body mass index is 35.08 kg/(m^2).  Generalized: Well developed, in no acute distress Skin: 1-2 + edema bilaterally lower extremities. Discoloration of the lower legs- starting at ankles and extending to mid-calf.   Neurological examination  Mentation: Alert oriented to time, place, history taking. Follows all commands speech and language fluent Cranial nerve II-XII:  Extraocular movements were full, visual field were full on confrontational test.  Motor: The motor testing reveals 5 over 5 strength of all 4 extremities. Patient has a left foot drop. Good symmetric motor tone is noted throughout.  Sensory: Sensory testing is intact to soft touch on all 4 extremities. No evidence of extinction is noted.  Coordination: Cerebellar testing reveals good finger-nose-finger and heel-to-shin bilaterally.  Gait and station: Gait is slightly wide based and uses a walker when ambulating. Tandem gait not tested. Romberg is negative. No drift is seen.  Reflexes: Deep tendon reflexes are symmetric and normal bilaterally.     DIAGNOSTIC DATA (LABS, IMAGING, TESTING) - I reviewed patient records, labs, notes, testing and imaging myself where available.  Lab Results  Component Value Date   WBC 8.0 09/04/2013    HGB 14.2 09/04/2013   HCT 42.9 09/04/2013   MCV 97.7 09/04/2013   PLT 241 09/04/2013      Component Value Date/Time   NA 141 01/13/2014 1222   K 4.7 01/13/2014 1222   CL 103 01/13/2014 1222   CO2 29 01/13/2014 1222   GLUCOSE 118* 01/13/2014 1222   GLUCOSE 132* 10/01/2006 1605   BUN 32* 01/13/2014 1222   CREATININE 1.5* 01/13/2014 1222   CREATININE 2.28* 02/08/2011 0819   CALCIUM 8.8 01/13/2014 1222   PROT 7.2 01/13/2014 1222   ALBUMIN 3.8 01/13/2014 1222   AST 23 01/13/2014 1222   ALT 23 01/13/2014 1222   ALKPHOS 66 01/13/2014 1222   BILITOT 1.1 01/13/2014 1222   GFRNONAA 34* 09/04/2013 1559   GFRAA 40* 09/04/2013 1559   Lab Results  Component Value Date   CHOL 154 01/13/2014   HDL 47.10 01/13/2014   LDLCALC 84 01/13/2014   TRIG 116.0 01/13/2014   CHOLHDL 3 01/13/2014   Lab Results  Component Value Date   HGBA1C 5.6 01/13/2014   Lab Results  Component Value Date   VITAMINB12 356 11/03/2007   Lab Results  Component Value Date   TSH 1.854 12/14/2011      ASSESSMENT AND PLAN 73 y.o. year old female  has a past medical history of History of breast cancer; Osteoporosis; Idiopathic pulmonary fibrosis; Bronchiectasis; Vitamin D deficiency; Hyperlipidemia; Allergic rhinitis; GERD (gastroesophageal reflux disease); Left knee DJD; Anxiety; Migraine; Diverticulosis of colon; Seizure disorder; Temporal arteritis; CVA (cerebral infarction) (10/2007); PMR (polymyalgia rheumatica); Anemia; Gait disorder; Peripheral neuropathy; HTN (hypertension); Type II or unspecified type diabetes mellitus without mention of complication, not stated as  uncontrolled; Renal insufficiency; Previous back surgery (10/09/12); and Polyneuropathy in other diseases classified elsewhere (02/17/2013). here with:  1. Temporal arteritis 2. Peripheral neuropathy 3. lumbosacral radiculopathy  Patient's last sedimentation rate was 6 and completed by Dr.Beekman. Patient reduce her dose of prednisone to 12.5 mg this week. The  patient should return in 6 weeks to check a sedimentation rate. The order has been placed. Patient is complaining of increased pain and discomfort associated with peripheral neuropathy. She states it is worse in the left leg than the right. Patient also has a left lumbosacral radiculopathy. She currently takes gabapentin 300 mg 2 tablets in the morning and 2 tablets at bedtime. I will increase the nighttime dose considering that is when she experiences the most discomfort. Patient also complains that she is drowsy during the day. She also takes scheduled OxyContin. Patient should take gabapentin- 1 tablet in the morning, 1 tablet at noon, 1 tablet at dinner time and 2 tablets before bed. Patient should let us know if she is unable to tolerate this medication. Patient should follow up in 3 months or sooner if needed.   Ward Givens, MSN, NP-C 03/22/2014, 10:17 AM [facility] [address], [place], [place] [address] [phone]  Note: This document was prepared with digital dictation and possible smart phrase technology. Any transcriptional errors that result from this process are unintentional.

## 2014-03-22 NOTE — Patient Instructions (Signed)
Lumbosacral Radiculopathy °Lumbosacral radiculopathy is a pinched nerve or nerves in the low back (lumbosacral area). When this happens you may have weakness in your legs and may not be able to stand on your toes. You may have pain going down into your legs. There may be difficulties with walking normally. There are many causes of this problem. Sometimes this may happen from an injury, or simply from arthritis or boney problems. It may also be caused by other illnesses such as diabetes. If there is no improvement after treatment, further studies may be done to find the exact cause. °DIAGNOSIS  °X-rays may be needed if the problems become long standing. Electromyograms may be done. This study is one in which the working of nerves and muscles is studied. °HOME CARE INSTRUCTIONS  °· Applications of ice packs may be helpful. Ice can be used in a plastic bag with a towel around it to prevent frostbite to skin. This may be used every 2 hours for 20 to 30 minutes, or as needed, while awake, or as directed by your caregiver. °· Only take over-the-counter or prescription medicines for pain, discomfort, or fever as directed by your caregiver. °· If physical therapy was prescribed, follow your caregiver's directions. °SEEK IMMEDIATE MEDICAL CARE IF:  °· You have pain not controlled with medications. °· You seem to be getting worse rather than better. °· You develop increasing weakness in your legs. °· You develop loss of bowel or bladder control. °· You have difficulty with walking or balance, or develop clumsiness in the use of your legs. °· You have a fever. °MAKE SURE YOU:  °· Understand these instructions. °· Will watch your condition. °· Will get help right away if you are not doing well or get worse. °Document Released: 09/10/2005 Document Revised: 12/03/2011 Document Reviewed: 04/30/2008 °ExitCare® Patient Information ©2015 [employer]. This information is not intended to replace advice given to you by your health  care [name]. Make sure you discuss any questions you have with your health care [name]. ° °

## 2014-03-23 ENCOUNTER — Encounter: Payer: Self-pay | Admitting: Neurology

## 2014-03-24 ENCOUNTER — Other Ambulatory Visit: Payer: Self-pay

## 2014-03-24 MED ORDER — GABAPENTIN 300 MG PO CAPS: ORAL_CAPSULE | ORAL | Status: DC

## 2014-03-24 NOTE — Telephone Encounter (Signed)
Pharmacy requested 90 day Rx

## 2014-03-25 ENCOUNTER — Telehealth: Payer: Self-pay | Admitting: Adult Health

## 2014-03-25 NOTE — Telephone Encounter (Signed)
Rx has been resent for 90 day supply

## 2014-03-25 NOTE — Telephone Encounter (Signed)
Patient calling to state that [facility] is asking if it is okay to send a 90 day supply of Gabapentin since they cannot send her a 30 day supply like Megan requested. Please call [facility] at [phone].

## 2014-04-06 ENCOUNTER — Encounter: Payer: Self-pay | Admitting: Family Medicine

## 2014-04-06 ENCOUNTER — Ambulatory Visit (INDEPENDENT_AMBULATORY_CARE_PROVIDER_SITE_OTHER): Payer: Medicare Other | Admitting: Family Medicine

## 2014-04-06 VITALS — BP 120/76 | HR 89 | Wt 198.0 lb

## 2014-04-06 DIAGNOSIS — G8929 Other chronic pain: Secondary | ICD-10-CM

## 2014-04-06 DIAGNOSIS — I10 Essential (primary) hypertension: Secondary | ICD-10-CM

## 2014-04-06 DIAGNOSIS — M810 Age-related osteoporosis without current pathological fracture: Secondary | ICD-10-CM | POA: Diagnosis not present

## 2014-04-06 DIAGNOSIS — R61 Generalized hyperhidrosis: Secondary | ICD-10-CM

## 2014-04-06 DIAGNOSIS — M549 Dorsalgia, unspecified: Secondary | ICD-10-CM | POA: Diagnosis not present

## 2014-04-06 DIAGNOSIS — E669 Obesity, unspecified: Secondary | ICD-10-CM | POA: Insufficient documentation

## 2014-04-06 LAB — TSH: TSH: 1.52 u[IU]/mL (ref 0.35–4.50)

## 2014-04-06 MED ORDER — OXYCODONE HCL ER 10 MG PO T12A: 10.0000 mg | EXTENDED_RELEASE_TABLET | Freq: Two times a day (BID) | ORAL | Status: DC

## 2014-04-06 NOTE — Progress Notes (Signed)
Subjective:    Patient ID: [name], female    DOB: [birth year], 73 y.o.   MRN: [id number]  HPI Medical followup  Chronic low back pain. She's had multiple surgeries and has rods in her neck and has chronic cervical and lumbar pain. Takes OxyContin since 2011. Takes 10 mg twice daily. Needs refills. No constipation issues. Pain is fairly well controlled with oxycodone. She has not gotten controlled other medications. No history of misuse  Osteoporosis. She gets Prolia injections every 6 months and will be due for repeat September. No recent falls. Takes calcium and vitamin D  New problem of frequent perspiration. This occurs even when she is cool indoors. Mostly from the upper chest. No recent appetite or weight changes. No consistent diarrhea symptoms or tremor. No palpitations.  Hypertension. History of good control. She has multiple other chronic problems including history of GERD, hyperlipidemia, chronic kidney disease.  Past Medical History  Diagnosis Date  . History of breast cancer   . Osteoporosis   . Idiopathic pulmonary fibrosis   . Bronchiectasis   . Vitamin D deficiency   . Hyperlipidemia   . Allergic rhinitis   . GERD (gastroesophageal reflux disease)   . Left knee DJD   . Anxiety   . Migraine   . Diverticulosis of colon   . Seizure disorder   . Temporal arteritis     Right eye blind, on steroids per [facility]/ Dr [name]  . CVA (cerebral infarction) 10/2007    Right thalamic   . PMR (polymyalgia rheumatica)   . Anemia   . Gait disorder   . Peripheral neuropathy   . HTN (hypertension)   . Type II or unspecified type diabetes mellitus without mention of complication, not stated as uncontrolled     2nd to steriods  . Renal insufficiency   . Previous back surgery 10/09/12  . Polyneuropathy in other diseases classified elsewhere 02/17/2013   Past Surgical History  Procedure Laterality Date  . Mastectomy    . Cholecystectomy    . Total knee arthroplasty        Left  . Tonsillectomy    . Cataract extraction      OS - Summer 2010  . Lumbar fusion  04/2010    W/Mechanical fixation - [facility]  . Spinal fusion  07/2010    T10-L2 interbody fusion / [facility]  . Colonoscopy  12/15/2011    Procedure: COLONOSCOPY;  Surgeon: [name], MD;  Location: [facility];  Service: Endoscopy;  Laterality: N/A;  . Carpal tunnel release Right     reports that she has never smoked. She has never used smokeless tobacco. She reports that she does not drink alcohol or use illicit drugs. family history includes Brain cancer in her mother; Breast cancer in her sister; Dementia in her father; Hypertension in her father and mother; Multiple sclerosis in her sister. Allergies  Allergen Reactions  . Hydromorphone Other (See Comments)    Cognitive changes       Review of Systems  Constitutional: Positive for fatigue. Negative for chills, appetite change and unexpected weight change.  Eyes: Negative for visual disturbance.  Respiratory: Negative for cough, chest tightness, shortness of breath and wheezing.   Cardiovascular: Negative for chest pain, palpitations and leg swelling.  Endocrine: Negative for polydipsia and polyuria.  Genitourinary: Negative for dysuria.  Musculoskeletal: Positive for back pain.  Neurological: Negative for dizziness, seizures, syncope, weakness, light-headedness and headaches.       Objective:  Physical Exam  Constitutional: She is oriented to person, place, and time. She appears well-developed and well-nourished. No distress.  Neck: Neck supple. No thyromegaly present.  Cardiovascular: Normal rate and regular rhythm.   Pulmonary/Chest: Effort normal and breath sounds normal. No respiratory distress. She has no wheezes. She has no rales.  Musculoskeletal: She exhibits no edema.  Neurological: She is alert and oriented to person, place, and time. No cranial nerve deficit.          Assessment & Plan:  #1  chronic low back pain and cervical neck pain. Refill OxyContin. #2 osteoporosis. She will call 2-3 weeks prior to when Prolia injection is due. #3 frequent perspiration. Doubt pathology. Check TSH. She is on prednisone 12.5 mg daily for temporal arteritis and explained this could very well be the culprit behind her diaphoresis. Rheumatologist has tried to taper this down #4 hypertension which is stable

## 2014-04-06 NOTE — Patient Instructions (Signed)
Call us a couple of weeks prior to when Prolia injection due.

## 2014-04-06 NOTE — Progress Notes (Signed)
Pre visit review using our clinic review tool, if applicable. No additional management support is needed unless otherwise documented below in the visit note. 

## 2014-04-07 ENCOUNTER — Encounter: Payer: Self-pay | Admitting: Family Medicine

## 2014-04-08 ENCOUNTER — Other Ambulatory Visit: Payer: Self-pay | Admitting: Adult Health

## 2014-04-08 DIAGNOSIS — N179 Acute kidney failure, unspecified: Secondary | ICD-10-CM | POA: Diagnosis not present

## 2014-04-08 DIAGNOSIS — M316 Other giant cell arteritis: Secondary | ICD-10-CM | POA: Diagnosis not present

## 2014-04-09 ENCOUNTER — Telehealth: Payer: Self-pay | Admitting: Adult Health

## 2014-04-09 DIAGNOSIS — M316 Other giant cell arteritis: Secondary | ICD-10-CM

## 2014-04-09 LAB — SEDIMENTATION RATE: Sed Rate: 10 mm/hr (ref 0–40)

## 2014-04-09 NOTE — Telephone Encounter (Signed)
I called the patient, sedimentation rate decreased from 18 to 10. We will decrease prednisone to 10 mg daily and recheck Sedimentation rate in 6 weeks. Patient verbalized understanding.

## 2014-04-12 DIAGNOSIS — Z853 Personal history of malignant neoplasm of breast: Secondary | ICD-10-CM | POA: Diagnosis not present

## 2014-04-12 DIAGNOSIS — Z1231 Encounter for screening mammogram for malignant neoplasm of breast: Secondary | ICD-10-CM | POA: Diagnosis not present

## 2014-04-15 ENCOUNTER — Telehealth: Payer: Self-pay | Admitting: Family Medicine

## 2014-04-15 NOTE — Telephone Encounter (Signed)
Pt informed

## 2014-04-15 NOTE — Telephone Encounter (Signed)
TSH was normal.

## 2014-04-15 NOTE — Telephone Encounter (Signed)
Pt would results of labs for thyroid done last week. pls call pt

## 2014-04-20 ENCOUNTER — Encounter: Payer: Self-pay | Admitting: Family Medicine

## 2014-04-27 ENCOUNTER — Telehealth: Payer: Self-pay | Admitting: Family Medicine

## 2014-04-27 NOTE — Telephone Encounter (Signed)
Pt lost her rx for OxyCODONE (OXYCONTIN) 10 mg T12A 12 hr tablet Dr Elease Hashimoto had wrtiten 3 and pt actually lost both. Pt is requesting a new rx , at least if we can get her enough to get her through until dr burchette returns.  Pt has only one tab left!!

## 2014-04-28 MED ORDER — OXYCODONE HCL ER 10 MG PO T12A: 10.0000 mg | EXTENDED_RELEASE_TABLET | Freq: Two times a day (BID) | ORAL | Status: DC

## 2014-04-28 NOTE — Telephone Encounter (Signed)
Pt aware that RX is ready for pick up. Pt only given #30. Pt stated that she thinks she threw away RX's.

## 2014-05-04 ENCOUNTER — Telehealth: Payer: Self-pay | Admitting: Family Medicine

## 2014-05-04 NOTE — Telephone Encounter (Addendum)
Pt will run out of oxycodone on 05-12-14.Pt will be going out of town on 05-07-14 and returning on 8-29. Pt lost her rxs

## 2014-05-04 NOTE — Telephone Encounter (Signed)
Pt stated last week while you were out that she threw away her Rx's by accident. Pt was only given #30 on 04/28/14. Is it okay to give patient a new RX?

## 2014-05-04 NOTE — Telephone Encounter (Signed)
Yes  Would only refill monthly

## 2014-05-05 MED ORDER — OXYCODONE HCL ER 10 MG PO T12A: 10.0000 mg | EXTENDED_RELEASE_TABLET | Freq: Two times a day (BID) | ORAL | Status: DC

## 2014-05-05 NOTE — Telephone Encounter (Signed)
Pt aware that RX is ready for pick up  

## 2014-05-24 ENCOUNTER — Telehealth: Payer: Self-pay | Admitting: Family Medicine

## 2014-05-24 DIAGNOSIS — M069 Rheumatoid arthritis, unspecified: Secondary | ICD-10-CM | POA: Diagnosis not present

## 2014-05-24 NOTE — Telephone Encounter (Signed)
appt scheduled for pt.  

## 2014-05-24 NOTE — Telephone Encounter (Signed)
Pt is needing to get a prolia injection by 9/10 or 9/11, pt states she was informed to call a week ahead.

## 2014-05-24 NOTE — Telephone Encounter (Signed)
That is fine she can schedule a injection appt.

## 2014-05-27 ENCOUNTER — Ambulatory Visit (INDEPENDENT_AMBULATORY_CARE_PROVIDER_SITE_OTHER): Payer: Medicare Other | Admitting: Family Medicine

## 2014-05-27 DIAGNOSIS — M810 Age-related osteoporosis without current pathological fracture: Secondary | ICD-10-CM | POA: Diagnosis not present

## 2014-05-27 MED ORDER — DENOSUMAB 60 MG/ML ~~LOC~~ SOLN
60.0000 mg | Freq: Once | SUBCUTANEOUS | Status: AC
  Administered 2014-05-27: 60 mg via SUBCUTANEOUS

## 2014-06-07 ENCOUNTER — Ambulatory Visit: Payer: Medicare Other | Admitting: Adult Health

## 2014-06-08 ENCOUNTER — Telehealth: Payer: Self-pay | Admitting: Family Medicine

## 2014-06-08 DIAGNOSIS — M316 Other giant cell arteritis: Secondary | ICD-10-CM | POA: Diagnosis not present

## 2014-06-08 DIAGNOSIS — M1990 Unspecified osteoarthritis, unspecified site: Secondary | ICD-10-CM | POA: Diagnosis not present

## 2014-06-08 DIAGNOSIS — N189 Chronic kidney disease, unspecified: Secondary | ICD-10-CM | POA: Diagnosis not present

## 2014-06-08 DIAGNOSIS — M069 Rheumatoid arthritis, unspecified: Secondary | ICD-10-CM | POA: Diagnosis not present

## 2014-06-08 NOTE — Telephone Encounter (Signed)
Pt request refill OxyCODONE (OXYCONTIN) 10 mg T12A 12 hr tablet

## 2014-06-08 NOTE — Telephone Encounter (Signed)
Refill OK

## 2014-06-08 NOTE — Telephone Encounter (Signed)
Last visit 04/06/14 Last refill 05/05/14 #60 0 refill

## 2014-06-09 MED ORDER — OXYCODONE HCL ER 10 MG PO T12A: 10.0000 mg | EXTENDED_RELEASE_TABLET | Freq: Two times a day (BID) | ORAL | Status: DC

## 2014-06-09 NOTE — Telephone Encounter (Signed)
Pt is aware that RX is ready for pick up. 

## 2014-06-29 ENCOUNTER — Ambulatory Visit (INDEPENDENT_AMBULATORY_CARE_PROVIDER_SITE_OTHER): Payer: Medicare Other | Admitting: Adult Health

## 2014-06-29 ENCOUNTER — Encounter: Payer: Self-pay | Admitting: Adult Health

## 2014-06-29 VITALS — BP 118/73 | HR 87 | Ht 62.0 in | Wt 198.2 lb

## 2014-06-29 DIAGNOSIS — M316 Other giant cell arteritis: Secondary | ICD-10-CM

## 2014-06-29 DIAGNOSIS — G63 Polyneuropathy in diseases classified elsewhere: Secondary | ICD-10-CM | POA: Diagnosis not present

## 2014-06-29 NOTE — Progress Notes (Signed)
PATIENT: [name] DOB: 1941-02-04  REASON FOR VISIT: follow up HISTORY FROM: patient  HISTORY OF PRESENT ILLNESS: [name] is a 73 year old female with a history of temporal arteritis. She returns today for follow-up. She is currently taking prednisone 12.5mg . She denies have any headaches like the temporal arteritis. She does get regular headaches occasionally. She is blind in the right eye and occasionally will have some blurry vision in the left. She contributes this to her eye getting tired.  She forgot that I had decreased the dose to 10. Her last sedimentation rate was 10. Patient also has peripheral neuropathy and takes gabapentin for the discomfort. Patient has a left sided lumbosacral radiculopathy that has caused a left footdrop. She states that the gabapentin is helpful but higher doses make her sleepy. She continues to have some discomfort in the feet and legs. She continues to take OxyContin for back pain and uses it at night to help her fall asleep.   HISTORY 03/22/14: 73 year old female with a history of temporal arteritis. She returns today for follow-up. The patent is currently on 15 mg prednisone. She states that she has not had any headaches but does have some temporal tenderness at times but not recently. Patient also has peripheral neuropathy and takes gabapentin for the discomfort. Patient has a left sided lumbosacral radiculopathy that has caused a left footdrop. The patient uses a walker when ambulating. She states that the discomfort in her legs has worsened. She has numbness and tingling that is much worse at night. She is not sure if the gabapentin is helping but states that it might be much worse without it. She has had three back surgeries in the past. She followed up with her neurosurgeon in January and she states that the doctor told her that the "last 3 levels had shifted about 59mm." Patient also reports that she feels drowsy throughout the day. She takes 2  tablets of the gabapentin in the morning and before bed as well as schedule OxyContin twice a day. She returns today for an evaluation.   REVIEW OF SYSTEMS: Full 14 system review of systems performed and notable only for:  Constitutional: N/A  Eyes: N/A Ear/Nose/Throat: N/A  Skin: N/A  Cardiovascular: N/A  Respiratory: N/A  Gastrointestinal: N/A  Genitourinary: N/A Hematology/Lymphatic: N/A  Endocrine: N/A Musculoskeletal:N/A  Allergy/Immunology: N/A  Neurological: N/A Psychiatric: N/A Sleep: N/A   ALLERGIES: Allergies  Allergen Reactions  . Hydromorphone Other (See Comments)    Cognitive changes     HOME MEDICATIONS: Outpatient Prescriptions Prior to Visit  Medication Sig Dispense Refill  . amLODipine (NORVASC) 5 MG tablet Take 1 tablet (5 mg total) by mouth daily.  90 tablet  3  . calcium carbonate (OS-CAL) 600 MG TABS Take 600 mg by mouth daily.       . cholecalciferol (VITAMIN D) 1000 UNITS tablet Take 1,000 Units by mouth daily.        Marland Kitchen denosumab (PROLIA) 60 MG/ML SOLN Inject 60 mg into the skin every 6 (six) months.        . ferrous sulfate 325 (65 FE) MG tablet Take 325 mg by mouth 2 (two) times daily.       . furosemide (LASIX) 40 MG tablet Take 1 tablet (40 mg total) by mouth daily.  90 tablet  3  . gabapentin (NEURONTIN) 300 MG capsule Take 1 tablet in the morning, 1 tablet at noon, and 1 tablet with dinner and 2  tablets before bedtime.  450 capsule  1  . Golimumab ([place] ARIA IV) Inject into the vein.      Marland Kitchen losartan (COZAAR) 50 MG tablet TAKE 1 TABLET DAILY  90 tablet  2  . Multiple Vitamins-Minerals (MULTIVITAMIN,TX-MINERALS) tablet Take 1 tablet by mouth daily.        . OxyCODONE (OXYCONTIN) 10 mg T12A 12 hr tablet Take 1 tablet (10 mg total) by mouth every 12 (twelve) hours. May refill on or after June 15, 2014  60 tablet  0  . OxyCODONE (OXYCONTIN) 10 mg T12A 12 hr tablet Take 1 tablet (10 mg total) by mouth every 12 (twelve) hours. May refill on or  after May 15, 2014  60 tablet  0  . OxyCODONE (OXYCONTIN) 10 mg T12A 12 hr tablet Take 1 tablet (10 mg total) by mouth every 12 (twelve) hours.  60 tablet  0  . Oxycodone HCl 10 MG TABS Take 1 tablet (10 mg total) by mouth every 4 (four) hours as needed (breakthrough pain.). Breakthrough pain.  180 tablet  0  . potassium chloride (K-DUR,KLOR-CON) 10 MEQ tablet Take 10 mEq by mouth daily.      . predniSONE (DELTASONE) 10 MG tablet TAKE 1 TABLET DAILY  90 tablet  3  . simvastatin (ZOCOR) 5 MG tablet Take 1 tablet (5 mg total) by mouth at bedtime.  90 tablet  3   No facility-administered medications prior to visit.    PAST MEDICAL HISTORY: Past Medical History  Diagnosis Date  . History of breast cancer   . Osteoporosis   . Idiopathic pulmonary fibrosis   . Bronchiectasis   . Vitamin D deficiency   . Hyperlipidemia   . Allergic rhinitis   . GERD (gastroesophageal reflux disease)   . Left knee DJD   . Anxiety   . Migraine   . Diverticulosis of colon   . Seizure disorder   . Temporal arteritis     Right eye blind, on steroids per [facility]/ Dr [name]  . CVA (cerebral infarction) 10/2007    Right thalamic   . PMR (polymyalgia rheumatica)   . Anemia   . Gait disorder   . Peripheral neuropathy   . HTN (hypertension)   . Type II or unspecified type diabetes mellitus without mention of complication, not stated as uncontrolled     2nd to steriods  . Renal insufficiency   . Previous back surgery 10/09/12  . Polyneuropathy in other diseases classified elsewhere 02/17/2013    PAST SURGICAL HISTORY: Past Surgical History  Procedure Laterality Date  . Mastectomy    . Cholecystectomy    . Total knee arthroplasty      Left  . Tonsillectomy    . Cataract extraction      OS - Summer 2010  . Lumbar fusion  04/2010    W/Mechanical fixation - [facility]  . Spinal fusion  07/2010    T10-L2 interbody fusion / [facility]  . Colonoscopy  12/15/2011    Procedure:  COLONOSCOPY;  Surgeon: [name], MD;  Location: [facility];  Service: Endoscopy;  Laterality: N/A;  . Carpal tunnel release Right     FAMILY HISTORY: Family History  Problem Relation Age of Onset  . Brain cancer Mother   . Hypertension Mother   . Dementia Father   . Hypertension Father   . Breast cancer Sister   . Multiple sclerosis Sister     SOCIAL HISTORY: History   Social History  . Marital  Status: Widowed    Spouse Name: N/A    Number of Children: 1  . Years of Education: 12+ coll.   Occupational History  . Land     retired   Social History Main Topics  . Smoking status: Never Smoker   . Smokeless tobacco: Never Used  . Alcohol Use: No     Comment: heavy drinker until 1995 - sobriety with AA  . Drug Use: No  . Sexual Activity: Not Currently   Other Topics Concern  . Not on file   Social History Narrative      Married - husband invalid with stroke - widowed in 2009. 1 chil. 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 ACP - not formerly discussed - wishes to be a full code.    Patient is right handed. Patient consumes tea 2-3  times daily.               PHYSICAL EXAM  Filed Vitals:   06/29/14 1510  BP: 118/73  Pulse: 87  Height: 5\' 2"  (1.575 m)  Weight: 198 lb 3.2 oz (89.903 kg)   Body mass index is 36.24 kg/(m^2).  Generalized: Well developed, in no acute distress   Neurological examination  Mentation: Alert oriented to time, place, history taking. Follows all commands speech and language fluent Cranial nerve II-XII: Pupils were equal round reactive to light. Blind in the right eye. Extraocular movements were full, visual field were full on confrontational test. Facial sensation and strength were normal. Uvula tongue midline. Head turning and shoulder shrug  were normal and symmetric. Motor: The motor testing reveals 5 over 5 strength of all 4 extremities. Good symmetric  motor tone is noted throughout.  Sensory: Sensory testing is intact to soft touch on all 4 extremities. No evidence of extinction is noted.  Coordination: Cerebellar testing reveals good finger-nose-finger and heel-to-shin bilaterally.  Gait and station: Gait is normal. Tandem gait is normal. Romberg is negative. No drift is seen.  Reflexes: Deep tendon reflexes are symmetric and normal bilaterally.    DIAGNOSTIC DATA (LABS, IMAGING, TESTING) - I reviewed patient records, labs, notes, testing and imaging myself where available.  Lab Results  Component Value Date   WBC 8.0 09/04/2013   HGB 14.2 09/04/2013   HCT 42.9 09/04/2013   MCV 97.7 09/04/2013   PLT 241 09/04/2013      Component Value Date/Time   NA 141 01/13/2014 1222   K 4.7 01/13/2014 1222   CL 103 01/13/2014 1222   CO2 29 01/13/2014 1222   GLUCOSE 118* 01/13/2014 1222   GLUCOSE 132* 10/01/2006 1605   BUN 32* 01/13/2014 1222   CREATININE 1.5* 01/13/2014 1222   CREATININE 2.28* 02/08/2011 0819   CALCIUM 8.8 01/13/2014 1222   PROT 7.2 01/13/2014 1222   ALBUMIN 3.8 01/13/2014 1222   AST 23 01/13/2014 1222   ALT 23 01/13/2014 1222   ALKPHOS 66 01/13/2014 1222   BILITOT 1.1 01/13/2014 1222   GFRNONAA 34* 09/04/2013 1559   GFRAA 40* 09/04/2013 1559   Lab Results  Component Value Date   CHOL 154 01/13/2014   HDL 47.10 01/13/2014   LDLCALC 84 01/13/2014   TRIG 116.0 01/13/2014   CHOLHDL 3 01/13/2014   Lab Results  Component Value Date   HGBA1C 5.6 01/13/2014   Lab Results  Component Value Date   VITAMINB12 356 11/03/2007   Lab Results  Component Value Date   TSH 1.52 04/06/2014  ASSESSMENT AND PLAN 73 y.o. year old female  has a past medical history of History of breast cancer; Osteoporosis; Idiopathic pulmonary fibrosis; Bronchiectasis; Vitamin D deficiency; Hyperlipidemia; Allergic rhinitis; GERD (gastroesophageal reflux disease); Left knee DJD; Anxiety; Migraine; Diverticulosis of colon; Seizure disorder; Temporal  arteritis; CVA (cerebral infarction) (10/2007); PMR (polymyalgia rheumatica); Anemia; Gait disorder; Peripheral neuropathy; HTN (hypertension); Type II or unspecified type diabetes mellitus without mention of complication, not stated as uncontrolled; Renal insufficiency; Previous back surgery (10/09/12); and Polyneuropathy in other diseases classified elsewhere (02/17/2013). here with:  1. temporal arteritis 2. Peripheral neuropathy  Patient continues to take 12.5 mg of prednisone. She states that she forgot that I had asked her to decrease the dose to 10 mg. We will recheck a sedimentation rate today. As long as a sedimentation rate has not increased we will continue to taper her off the prednisone. Patient also complains of some peripheral neuropathy pain she states that the increase and gabapentin caused her to feel sleepy. She is not interested in switching to another medication such as Lyrica. I will prescribe her the compounded cream that is to be applied topically to the feet. If patient's symptoms worsen or she develops new symptoms she should let us know. Otherwise she will followup in 3 months.  Ward Givens, MSN, NP-C 06/29/2014, 3:14 PM [facility] [address] [place], [place] [address] [phone]  Note: This document was prepared with digital dictation and possible smart phrase  technology. Any transcriptional errors that result from this process are unintentional.

## 2014-06-29 NOTE — Patient Instructions (Signed)
Temporal Arteritis Arteries are blood vessels that carry blood from the heart to all parts of the body. Temporal arteritis is a swelling (inflammation) of certain large arteries. This usually affects arteries in the head and neck area, including arteries in the area on the side of the head, between the ears and eyes (temples). The condition can be very painful. It also can cause serious problems, even blindness. Early diagnosis and treatment is very important. CAUSES  Temporal arteritis results from the body reacting to injury or infection (inflammation). This may occur when the body's immune system (which fights germs and disease) makes a mistake. It attacks its own arteries. No one knows why this happens. However, certain things (risk factors) make it more likely that a person will develop temporal arteritis. They include:  Age. Most people with temporal arteritis are older than 50. The average age is 70.  Sex. Three times more women than men develop the condition.  Race and ethnic background. Caucasians are more likely to have temporal arteritis than other races. So are people whose families came from [place], [place], [place] or [place]  Having polymyalgia rheumatica (PMR). This condition causes stiffness and pain in the joints of the neck, shoulders and hips. About 15% of people with PMR also have temporal arteritis. SYMPTOMS  Not everyone with temporal arteritis has the same symptoms. Some people have just one symptom. Others may have several. The most common symptom is a new headache, often in the temple region. Symptoms may show up in other parts of the body too.   Symptoms affecting the head may include:  Temporal arteries that feel hard or swollen. It may hurt when the temples are touched.  Pain when combing your hair, or when laying your head on a pillow.  Pain in the jaw when chewing.  Pain in the throat or tongue.  Visual problems, including sudden loss of  vision in one eye, or seeing double.  Symptoms in other parts of the body may include:  Fever.  Fatigue.  A dry cough.  Pain in the hips and shoulders.  Pain in the arms during exercise.  Depression.  Weight loss. DIAGNOSIS  Symptoms of temporal arteritis are similar to symptoms for other conditions. This can make it hard to tell if you have the condition. To be sure, your caregiver will ask about your symptoms and do a physical exam. Certain tests may be necessary, such as:   An exam of your temples. Often, the temporal arteries will be swollen and hard. This can be felt.  A complete blood count. This test shows how many red blood cells are in your blood. Most people with temporal arteritis do not have enough red blood cells (anemic).  Erythrocyte sedimentation (also called sed rate test). It measures inflammation in the body. Almost everyone with temporal arteritis has a high sed rate.  C reactive protein test. This also shows if there is inflammation.  Biopsy a temporal artery. This means the caregiver will take out a small piece of an artery. Then, it is checked under a microscope for inflammation. More than one biopsy may be needed. That is because inflammation can be in one part of an artery and not in others. Your caregiver may need to check more than one spot. TREATMENT  Starting treatment right away is very important. Often, you will need to see a specialist in immunologic diseases (rheumatologist). Goals of treatment include protecting your eyesight. Once vision is gone, it might not come   back. The normal treatment is medication. It usually works well and quickly. Most people start getting better in a few days. Medication options include:  Corticosteroids. These are powerful drugs that fight inflammation. These drugs are most often used to treat temporal arteritis.  Usually, a high dose is taken at first. After symptoms improve, a smaller dose is used. The goal is to take  the smallest dose possible and still control your symptoms. That is because using corticosteroids for a long time can cause problems. They can make muscles and bones weak. They can cause blood pressure to go up, and cause diabetes. Also, people often gain weight when they take corticosteroids. Corticosteroids may need to be taken for one or two years.  Several newer drugs are being tested to treat temporal arteritis. Researchers are testing to see if new drugs will work as well as corticosteroids, but cause fewer problems than them. Testing of these drugs is not yet complete.  Some specialists recommend low dose aspirin to prevent blood clots. HOME CARE INSTRUCTIONS   Take any corticosteroids that your caregiver prescribes. Follow the directions carefully.  Take any vitamins or supplements that your caregiver suggests. This may include vitamin D and calcium. They help keep your bones from becoming weak.  Keep all appointments for checkups. Your caregiver will watch for any problems from the medication. Checkups may include:  Periodic blood tests.  Bone density testing. This checks how strong or weak your bones are.  Blood pressure checks. If your blood pressure rises, you may need to take a drug to control it while you are taking corticosteroids.  Blood sugar checks. This is to be sure you are not developing diabetes. If you have diabetes, corticosteroid medications may make it worse and require increased treatment.  Exercise. First, talk with your caregiver about what would be OK for you to do. Aerobic exercise (which increases your heart rate) is usually suggested. It does not have to require a lot of energy. Walking is aerobic exercise. This type of exercise is good because it helps prevent bone loss. It also helps control your blood pressure.  Follow a healthy diet. The goal is to prevent bone damage and diabetes. Include good sources of protein in your diet. Also, include fruits,  vegetables and whole grains. Your caregiver can refer you to an expert on healthy eating (dietitian) for more advice. SEEK MEDICAL CARE IF:   The symptoms that lead to your diagnosis return.  You develop worsening fever, fatigue, headache, weight loss, or pain in your jaw.  You develop signs of infection. Infections can be worse if you are on corticosteroid medication. SEEK IMMEDIATE MEDICAL CARE IF:   Your eyesight changes.  Pain does not go away, even after taking pain medicine.  You feel pain in your chest.  Breathing is difficult.  One side of your face or body suddenly becomes weak or numb.  You develop a fever of more than 102 F (38.9 C). Document Released: 07/08/2009 Document Revised: 12/03/2011 Document Reviewed: 11/04/2013 Exit[employer] Patient Information 2015 [employer]. This information is not intended to replace advice given to you by your health care provider. Make sure you discuss any questions you have with your health care provider.  

## 2014-06-29 NOTE — Progress Notes (Signed)
I have read the note, and I agree with the clinical assessment and plan.  Kjirsten Bloodgood KEITH   

## 2014-06-30 ENCOUNTER — Telehealth: Payer: Self-pay | Admitting: Adult Health

## 2014-06-30 LAB — SEDIMENTATION RATE: Sed Rate: 5 mm/hr (ref 0–40)

## 2014-06-30 NOTE — Telephone Encounter (Signed)
I called the patient. Sedimentation rate is normal. She will decrease prednisone to 10 mg daily. I will have her back in 1 month to recheck SED rate. If it remains low we will continue to decrease the prednisone.

## 2014-07-07 ENCOUNTER — Ambulatory Visit (INDEPENDENT_AMBULATORY_CARE_PROVIDER_SITE_OTHER): Payer: Medicare Other | Admitting: Family Medicine

## 2014-07-07 ENCOUNTER — Encounter: Payer: Self-pay | Admitting: Family Medicine

## 2014-07-07 VITALS — BP 120/78 | HR 60 | Temp 98.3°F | Wt 196.0 lb

## 2014-07-07 DIAGNOSIS — M549 Dorsalgia, unspecified: Secondary | ICD-10-CM | POA: Diagnosis not present

## 2014-07-07 DIAGNOSIS — G8929 Other chronic pain: Secondary | ICD-10-CM

## 2014-07-07 DIAGNOSIS — Z23 Encounter for immunization: Secondary | ICD-10-CM | POA: Diagnosis not present

## 2014-07-07 DIAGNOSIS — M316 Other giant cell arteritis: Secondary | ICD-10-CM | POA: Diagnosis not present

## 2014-07-07 DIAGNOSIS — M810 Age-related osteoporosis without current pathological fracture: Secondary | ICD-10-CM | POA: Diagnosis not present

## 2014-07-07 MED ORDER — OXYCODONE HCL ER 10 MG PO T12A: 10.0000 mg | EXTENDED_RELEASE_TABLET | Freq: Two times a day (BID) | ORAL | Status: DC

## 2014-07-07 NOTE — Progress Notes (Signed)
Subjective:    Patient ID: [name], female    DOB: Jun 10, 1941, 73 y.o.   MRN: [id number]  HPI Patient seen for routine medical followup. Her chronic problems include obesity, osteoarthritis, chronic low back pain, temporal arteritis, hypertension, chronic kidney disease. She is followed by neurology regarding her temporal arteritis and recently reduce prednisone 10 mg daily.  Chronic back pain controlled OxyContin 10 mg twice daily. His bills regimen for quite some time. She occasionally supplements with 10 mg immediate release oxycodone. Denies any constipation issues. She occasionally has loose stools.  Osteoporosis on chronic vitamin D and calcium and also prolia injections every 6 months. No bone density in the past couple of years. Denies any recent falls. She ambulates with walker.  Past Medical History  Diagnosis Date  . History of breast cancer   . Osteoporosis   . Idiopathic pulmonary fibrosis   . Bronchiectasis   . Vitamin D deficiency   . Hyperlipidemia   . Allergic rhinitis   . GERD (gastroesophageal reflux disease)   . Left knee DJD   . Anxiety   . Migraine   . Diverticulosis of colon   . Seizure disorder   . Temporal arteritis     Right eye blind, on steroids per [facility]/ Dr [name]  . CVA (cerebral infarction) 10/2007    Right thalamic   . PMR (polymyalgia rheumatica)   . Anemia   . Gait disorder   . Peripheral neuropathy   . HTN (hypertension)   . Type II or unspecified type diabetes mellitus without mention of complication, not stated as uncontrolled     2nd to steriods  . Renal insufficiency   . Previous back surgery 10/09/12  . Polyneuropathy in other diseases classified elsewhere 02/17/2013   Past Surgical History  Procedure Laterality Date  . Mastectomy    . Cholecystectomy    . Total knee arthroplasty      Left  . Tonsillectomy    . Cataract extraction      OS - Summer 2010  . Lumbar fusion  04/2010    W/Mechanical fixation - [facility]  . Spinal fusion  07/2010    T10-L2 interbody fusion / [facility]  . Colonoscopy  12/15/2011    Procedure: COLONOSCOPY;  Surgeon: [name], MD;  Location: [facility];  Service: Endoscopy;  Laterality: N/A;  . Carpal tunnel release Right     reports that she has never smoked. She has never used smokeless tobacco. She reports that she does not drink alcohol or use illicit drugs. family history includes Brain cancer in her mother; Breast cancer in her sister; Dementia in her father; Hypertension in her father and mother; Multiple sclerosis in her sister. Allergies  Allergen Reactions  . Hydromorphone Other (See Comments)    Cognitive changes       Review of Systems  Constitutional: Negative for fever, chills and unexpected weight change.  Respiratory: Negative for cough.   Cardiovascular: Negative for chest pain.  Gastrointestinal: Negative for constipation.  Musculoskeletal: Positive for back pain.       Objective:   Physical Exam  Constitutional: She is oriented to person, place, and time. She appears well-developed and well-nourished.  Neck: Neck supple.  Cardiovascular: Normal rate and regular rhythm.   Pulmonary/Chest: Effort normal and breath sounds normal. No respiratory distress. She has no wheezes. She has no rales.  Musculoskeletal: She exhibits no edema.  No pitting edema  Neurological: She is alert and oriented to  person, place, and time.  Psychiatric: She has a normal mood and affect. Her behavior is normal.          Assessment & Plan:  #1 osteoporosis. Set up repeat DEXA scan. Continue regular calcium and vitamin D. She is on chronic prednisone #2 chronic back pain. She had been on regimen of OxyContin 10 mg twice a day for many years. Refills given. No history of misuse. Back pain stable  #3 health maintenance. Flu vaccine given #4 temporal arteritis. Managedby neurology. Prednisone dose being tapered

## 2014-07-07 NOTE — Progress Notes (Signed)
Pre visit review using our clinic review tool, if applicable. No additional management support is needed unless otherwise documented below in the visit note. 

## 2014-07-14 DIAGNOSIS — N183 Chronic kidney disease, stage 3 (moderate): Secondary | ICD-10-CM | POA: Diagnosis not present

## 2014-07-14 DIAGNOSIS — I129 Hypertensive chronic kidney disease with stage 1 through stage 4 chronic kidney disease, or unspecified chronic kidney disease: Secondary | ICD-10-CM | POA: Diagnosis not present

## 2014-07-14 DIAGNOSIS — N179 Acute kidney failure, unspecified: Secondary | ICD-10-CM | POA: Diagnosis not present

## 2014-07-14 DIAGNOSIS — R600 Localized edema: Secondary | ICD-10-CM | POA: Diagnosis not present

## 2014-07-15 ENCOUNTER — Ambulatory Visit (INDEPENDENT_AMBULATORY_CARE_PROVIDER_SITE_OTHER)
Admission: RE | Admit: 2014-07-15 | Discharge: 2014-07-15 | Disposition: A | Discharge status: Home / Self Care | Payer: Medicare Other | Source: Ambulatory Visit | Attending: Family Medicine | Admitting: Family Medicine

## 2014-07-15 DIAGNOSIS — M810 Age-related osteoporosis without current pathological fracture: Secondary | ICD-10-CM

## 2014-07-28 DIAGNOSIS — I129 Hypertensive chronic kidney disease with stage 1 through stage 4 chronic kidney disease, or unspecified chronic kidney disease: Secondary | ICD-10-CM | POA: Diagnosis not present

## 2014-07-28 DIAGNOSIS — N179 Acute kidney failure, unspecified: Secondary | ICD-10-CM | POA: Diagnosis not present

## 2014-08-02 ENCOUNTER — Telehealth: Payer: Self-pay | Admitting: Adult Health

## 2014-08-02 DIAGNOSIS — M316 Other giant cell arteritis: Secondary | ICD-10-CM

## 2014-08-02 NOTE — Telephone Encounter (Signed)
I called the patient to let her know that we would like her to come in to have her sedimentation rate we checked. She can come in at her convenience. The patient verbalized understanding.

## 2014-08-05 NOTE — Telephone Encounter (Signed)
Patient calling to state that she is having bloodwork done at another office on Monday and wants to know if this sedimentation rate can be checked along with her bloodwork so she doesn't have to get stuck twice, please return call and advise.

## 2014-08-06 NOTE — Telephone Encounter (Signed)
That is fine as long as that office will draw a sedimentation rate and send the results to [place].

## 2014-08-09 DIAGNOSIS — M0589 Other rheumatoid arthritis with rheumatoid factor of multiple sites: Secondary | ICD-10-CM | POA: Diagnosis not present

## 2014-08-11 ENCOUNTER — Other Ambulatory Visit: Payer: Medicare Other

## 2014-08-11 DIAGNOSIS — M316 Other giant cell arteritis: Secondary | ICD-10-CM | POA: Diagnosis not present

## 2014-08-12 ENCOUNTER — Telehealth: Payer: Self-pay | Admitting: Adult Health

## 2014-08-12 LAB — SEDIMENTATION RATE: Sed Rate: 21 mm/hr (ref 0–40)

## 2014-08-12 MED ORDER — PREDNISONE 10 MG PO TABS: 5.0000 mg | ORAL_TABLET | Freq: Every day | ORAL | Status: DC

## 2014-08-12 MED ORDER — PREDNISONE 1 MG PO TABS: 4.0000 mg | ORAL_TABLET | Freq: Every day | ORAL | Status: DC

## 2014-08-12 NOTE — Telephone Encounter (Signed)
I called the patient regarding her sedimentation rate. I left  a message for her to call our office at her convenience.

## 2014-08-12 NOTE — Telephone Encounter (Signed)
Patient is returning your call.  Thank you.

## 2014-08-12 NOTE — Addendum Note (Signed)
Addended by: Trudie Buckler on: 08/12/2014 03:49 PM   Modules accepted: Orders

## 2014-08-12 NOTE — Telephone Encounter (Signed)
I called the patient. Her sedimentation rate increased to 21 from 5. She is currently taking 10 mg of prednisone. I will decrease her down to 9 mg of prednisone and recheck a sedimentation rate in one month. The patient is very cautious with her prednisone. She does not want [place] to taper it too fast because she is concerned about losing her vision in the other eye if the temporal arteritis returns.

## 2014-08-31 ENCOUNTER — Telehealth: Payer: Self-pay | Admitting: Adult Health

## 2014-08-31 MED ORDER — GABAPENTIN 300 MG PO CAPS: ORAL_CAPSULE | ORAL | Status: DC

## 2014-08-31 NOTE — Telephone Encounter (Signed)
Patient requesting 2 week supply of Rx gabapentin (NEURONTIN) 300 MG capsule until she receives shipment from [facility].  Please call and advise.

## 2014-08-31 NOTE — Telephone Encounter (Signed)
Rx has been sent.  I called back.  They are aware.   

## 2014-09-01 DIAGNOSIS — N183 Chronic kidney disease, stage 3 (moderate): Secondary | ICD-10-CM | POA: Diagnosis not present

## 2014-09-01 DIAGNOSIS — N179 Acute kidney failure, unspecified: Secondary | ICD-10-CM | POA: Diagnosis not present

## 2014-09-01 DIAGNOSIS — I129 Hypertensive chronic kidney disease with stage 1 through stage 4 chronic kidney disease, or unspecified chronic kidney disease: Secondary | ICD-10-CM | POA: Diagnosis not present

## 2014-09-01 DIAGNOSIS — R600 Localized edema: Secondary | ICD-10-CM | POA: Diagnosis not present

## 2014-09-13 ENCOUNTER — Telehealth: Payer: Self-pay | Admitting: Adult Health

## 2014-09-13 DIAGNOSIS — M316 Other giant cell arteritis: Secondary | ICD-10-CM

## 2014-09-13 NOTE — Telephone Encounter (Signed)
Please call the patient and have her come in to recheck her sedimentation rate. Order has been placed in [facility].

## 2014-09-14 NOTE — Telephone Encounter (Signed)
Called patient and advised of needed Sed Rate redraw, advised of times to come.

## 2014-09-15 ENCOUNTER — Other Ambulatory Visit (INDEPENDENT_AMBULATORY_CARE_PROVIDER_SITE_OTHER): Payer: Self-pay

## 2014-09-15 DIAGNOSIS — M810 Age-related osteoporosis without current pathological fracture: Secondary | ICD-10-CM | POA: Diagnosis not present

## 2014-09-15 DIAGNOSIS — M316 Other giant cell arteritis: Secondary | ICD-10-CM

## 2014-09-15 DIAGNOSIS — Z0289 Encounter for other administrative examinations: Secondary | ICD-10-CM

## 2014-09-15 DIAGNOSIS — M79643 Pain in unspecified hand: Secondary | ICD-10-CM | POA: Diagnosis not present

## 2014-09-15 DIAGNOSIS — M150 Primary generalized (osteo)arthritis: Secondary | ICD-10-CM | POA: Diagnosis not present

## 2014-09-15 DIAGNOSIS — M0589 Other rheumatoid arthritis with rheumatoid factor of multiple sites: Secondary | ICD-10-CM | POA: Diagnosis not present

## 2014-09-16 LAB — SEDIMENTATION RATE: SED RATE: 13 mm/h (ref 0–40)

## 2014-09-20 ENCOUNTER — Telehealth: Payer: Self-pay | Admitting: Adult Health

## 2014-09-20 MED ORDER — PREDNISONE 1 MG PO TABS: 3.0000 mg | ORAL_TABLET | Freq: Every day | ORAL | Status: DC

## 2014-09-20 NOTE — Telephone Encounter (Signed)
I called the patient. Her Sedimentation rate has decreased to 13 was previously 21. I will decrease prednisone to 8 mg daily. We will recheck a Sedimentation rate in 1 month.

## 2014-09-29 ENCOUNTER — Ambulatory Visit: Payer: Medicare Other | Admitting: Adult Health

## 2014-10-05 DIAGNOSIS — M0589 Other rheumatoid arthritis with rheumatoid factor of multiple sites: Secondary | ICD-10-CM | POA: Diagnosis not present

## 2014-10-06 ENCOUNTER — Other Ambulatory Visit: Payer: Self-pay | Admitting: Dermatology

## 2014-10-06 DIAGNOSIS — L821 Other seborrheic keratosis: Secondary | ICD-10-CM | POA: Diagnosis not present

## 2014-10-06 DIAGNOSIS — D485 Neoplasm of uncertain behavior of skin: Secondary | ICD-10-CM | POA: Diagnosis not present

## 2014-10-07 ENCOUNTER — Ambulatory Visit (INDEPENDENT_AMBULATORY_CARE_PROVIDER_SITE_OTHER): Payer: Medicare Other | Admitting: Family Medicine

## 2014-10-07 ENCOUNTER — Encounter: Payer: Self-pay | Admitting: Family Medicine

## 2014-10-07 VITALS — BP 118/74 | HR 85 | Temp 98.2°F | Wt 192.0 lb

## 2014-10-07 DIAGNOSIS — M549 Dorsalgia, unspecified: Secondary | ICD-10-CM

## 2014-10-07 DIAGNOSIS — G8929 Other chronic pain: Secondary | ICD-10-CM

## 2014-10-07 MED ORDER — OXYCODONE HCL ER 10 MG PO T12A: 10.0000 mg | EXTENDED_RELEASE_TABLET | Freq: Two times a day (BID) | ORAL | Status: DC

## 2014-10-07 MED ORDER — OXYCODONE HCL 10 MG PO TABS: 10.0000 mg | ORAL_TABLET | ORAL | Status: DC | PRN

## 2014-10-07 MED ORDER — OXYCODONE HCL ER 10 MG PO T12A
10.0000 mg | EXTENDED_RELEASE_TABLET | Freq: Two times a day (BID) | ORAL | Status: DC
Start: 2014-10-07 — End: 2015-01-06

## 2014-10-07 NOTE — Progress Notes (Signed)
Subjective:    Patient ID: [name], female    DOB: 04/26/1941, 74 y.o.   MRN: [id number]  HPI  Here regarding follow-up chronic low back pain. She's had previous surgery and has chronic pain which has been treated for several years with OxyContin. She does have breakthrough pain for which takes oxycodone. Requesting refills. She is also on gabapentin for chronic neuropathy pains and has some sedation related to that. No constipation issues. No recent falls. Pain is fairly well-controlled but she still has breakthrough as above. Temporal arteritis followed by neurology currently on prednisone 8 mg daily. Recent DEXA scan performed and actually increase from previous studies. She remains on Prolia injections every 6 months.  Past Medical History  Diagnosis Date  . History of breast cancer   . Osteoporosis   . Idiopathic pulmonary fibrosis   . Bronchiectasis   . Vitamin D deficiency   . Hyperlipidemia   . Allergic rhinitis   . GERD (gastroesophageal reflux disease)   . Left knee DJD   . Anxiety   . Migraine   . Diverticulosis of colon   . Seizure disorder   . Temporal arteritis     Right eye blind, on steroids per [facility]/ Dr [name]  . CVA (cerebral infarction) 10/2007    Right thalamic   . PMR (polymyalgia rheumatica)   . Anemia   . Gait disorder   . Peripheral neuropathy   . HTN (hypertension)   . Type II or unspecified type diabetes mellitus without mention of complication, not stated as uncontrolled     2nd to steriods  . Renal insufficiency   . Previous back surgery 10/09/12  . Polyneuropathy in other diseases classified elsewhere 02/17/2013   Past Surgical History  Procedure Laterality Date  . Mastectomy    . Cholecystectomy    . Total knee arthroplasty      Left  . Tonsillectomy    . Cataract extraction      OS - Summer 2010  . Lumbar fusion  04/2010    W/Mechanical fixation - [facility]  . Spinal fusion  07/2010    T10-L2 interbody fusion /  [facility]  . Colonoscopy  12/15/2011    Procedure: COLONOSCOPY;  Surgeon: [name], MD;  Location: [facility];  Service: Endoscopy;  Laterality: N/A;  . Carpal tunnel release Right     reports that she has never smoked. She has never used smokeless tobacco. She reports that she does not drink alcohol or use illicit drugs. family history includes Brain cancer in her mother; Breast cancer in her sister; Dementia in her father; Hypertension in her father and mother; Multiple sclerosis in her sister. Allergies  Allergen Reactions  . Hydromorphone Other (See Comments)    Cognitive changes      Review of Systems  Constitutional: Negative for fever, chills, appetite change and unexpected weight change.  Respiratory: Negative for shortness of breath.   Cardiovascular: Negative for chest pain.  Musculoskeletal: Positive for back pain.       Objective:   Physical Exam  Constitutional: She appears well-developed and well-nourished.  Cardiovascular: Normal rate and regular rhythm.   Pulmonary/Chest: Effort normal and breath sounds normal. No respiratory distress. She has no wheezes. She has no rales.  Musculoskeletal: She exhibits no edema.          Assessment & Plan:  Chronic low back pain. Controlled most of time but has some daily breakthrough pain.  Refills of medication given. Discussed  measures to reduce constipation . Routine follow-up 3 months

## 2014-10-07 NOTE — Progress Notes (Signed)
Pre visit review using our clinic review tool, if applicable. No additional management support is needed unless otherwise documented below in the visit note. 

## 2014-10-08 ENCOUNTER — Other Ambulatory Visit: Payer: Self-pay | Admitting: Family Medicine

## 2014-10-27 DIAGNOSIS — Z961 Presence of intraocular lens: Secondary | ICD-10-CM | POA: Diagnosis not present

## 2014-10-27 DIAGNOSIS — H43813 Vitreous degeneration, bilateral: Secondary | ICD-10-CM | POA: Diagnosis not present

## 2014-10-27 DIAGNOSIS — H5010 Unspecified exotropia: Secondary | ICD-10-CM | POA: Diagnosis not present

## 2014-10-30 ENCOUNTER — Other Ambulatory Visit: Payer: Self-pay | Admitting: Neurology

## 2014-11-15 ENCOUNTER — Telehealth: Payer: Self-pay | Admitting: Family Medicine

## 2014-11-15 MED ORDER — SIMVASTATIN 5 MG PO TABS: 5.0000 mg | ORAL_TABLET | Freq: Every day | ORAL | Status: DC

## 2014-11-15 NOTE — Telephone Encounter (Signed)
Pt will run out of [facility]s [facility] can get [facility] any refills. Can you send 2 wks of simvastatin (ZOCOR) 5 MG tablet [facility] [address]  Pt also states she needs to give 2 week notice on her prolia inj. This is notice.  Pt is scheduled for March 9 at 12:00

## 2014-11-15 NOTE — Telephone Encounter (Signed)
Rx sent to pharmacy   

## 2014-11-17 ENCOUNTER — Encounter: Payer: Self-pay | Admitting: Adult Health

## 2014-11-17 ENCOUNTER — Ambulatory Visit (INDEPENDENT_AMBULATORY_CARE_PROVIDER_SITE_OTHER): Payer: Medicare Other | Admitting: Adult Health

## 2014-11-17 VITALS — BP 125/75 | HR 102

## 2014-11-17 DIAGNOSIS — M21372 Foot drop, left foot: Secondary | ICD-10-CM

## 2014-11-17 DIAGNOSIS — M316 Other giant cell arteritis: Secondary | ICD-10-CM | POA: Diagnosis not present

## 2014-11-17 DIAGNOSIS — R269 Unspecified abnormalities of gait and mobility: Secondary | ICD-10-CM | POA: Diagnosis not present

## 2014-11-17 DIAGNOSIS — G63 Polyneuropathy in diseases classified elsewhere: Secondary | ICD-10-CM | POA: Diagnosis not present

## 2014-11-17 NOTE — Patient Instructions (Signed)
I will check blood work today.  I will call you with results and adjust your prednisone. Continue gabapentin Referral to physical therapy for AFO brace.

## 2014-11-17 NOTE — Progress Notes (Signed)
PATIENT: [name] DOB: [birth year]  REASON FOR VISIT: follow up HISTORY FROM: patient  HISTORY OF PRESENT ILLNESS: [name] is a 74 year old female with a history of temporal arteritis. She returns today for follow-up. Her last sedimentation rate was 13- her prednisone was reduced to 8 mg daily. She reports that.  Patient also has a history of peripheral neuropathy and chronic back pain. She takes gabapentin for the neuropathy discomfort and that seems to be working well. Patient does feel that her peripheral neuropathy has advanced. She states that the numbness now extends up to the knee. Patient is concerned about redness in the lower legs however she states this is been that way for quite some time. The patient will use oxycotin for her back pain and to help her sleep.  HISTORY 06/29/14: [name] is a 74 year old female with a history of temporal arteritis. She returns today for follow-up. She is currently taking prednisone 12.5mg . She denies have any headaches like the temporal arteritis. She does get regular headaches occasionally. She is blind in the right eye and occasionally will have some blurry vision in the left. She contributes this to her eye getting tired. She forgot that I had decreased the dose to 10. Her last sedimentation rate was 10. Patient also has peripheral neuropathy and takes gabapentin for the discomfort. Patient has a left sided lumbosacral radiculopathy that has caused a left footdrop. She states that the gabapentin is helpful but higher doses make her sleepy. She continues to have some discomfort in the feet and legs. She continues to take OxyContin for back pain and uses it at night to help her fall asleep.   HISTORY 03/22/14: 74 year old female with a history of temporal arteritis. She returns today for follow-up. The patent is currently on 15 mg prednisone. She states that she has not had any headaches but does have some temporal tenderness at times but not  recently. Patient also has peripheral neuropathy and takes gabapentin for the discomfort. Patient has a left sided lumbosacral radiculopathy that has caused a left footdrop. The patient uses a walker when ambulating. She states that the discomfort in her legs has worsened. She has numbness and tingling that is much worse at night. She is not sure if the gabapentin is helping but states that it might be much worse without it. She has had three back surgeries in the past. She followed up with her neurosurgeon in January and she states that the doctor told her that the "last 3 levels had shifted about 62mm." Patient also reports that she feels drowsy throughout the day. She takes 2 tablets of the gabapentin in the morning and before bed as well as schedule OxyContin twice a day. She returns today for an evaluation.   REVIEW OF SYSTEMS: Out of a complete 14 system review of symptoms, the patient complains only of the following symptoms, and all other reviewed systems are negative.  Chills, fatigue, runny nose, cough, wheezing  ALLERGIES: Allergies  Allergen Reactions  . Hydromorphone Other (See Comments)    Cognitive changes     HOME MEDICATIONS: Outpatient Prescriptions Prior to Visit  Medication Sig Dispense Refill  . amLODipine (NORVASC) 5 MG tablet Take 1 tablet (5 mg total) by mouth daily. 90 tablet 3  . calcium carbonate (OS-CAL) 600 MG TABS Take 600 mg by mouth daily.     . cholecalciferol (VITAMIN D) 1000 UNITS tablet Take 1,000 Units by mouth daily.      Marland Kitchen  denosumab (PROLIA) 60 MG/ML SOLN Inject 60 mg into the skin every 6 (six) months.      . ferrous sulfate 325 (65 FE) MG tablet Take 325 mg by mouth 2 (two) times daily.     . furosemide (LASIX) 40 MG tablet Take 1 tablet (40 mg total) by mouth daily. 90 tablet 3  . gabapentin (NEURONTIN) 300 MG capsule TAKE 1 CAPSULE IN THE MORNNG, 1 CAPSULE AT NOON, 1 WITH DINNER AND 2 BEFORE BEDTIME 150 capsule 3  . Golimumab ([place] ARIA IV) Inject  into the vein.    . Multiple Vitamins-Minerals (MULTIVITAMIN,TX-MINERALS) tablet Take 1 tablet by mouth daily.      . OxyCODONE (OXYCONTIN) 10 mg T12A 12 hr tablet Take 1 tablet (10 mg total) by mouth every 12 (twelve) hours. May refill on or after December 06, 2014 60 tablet 0  . Oxycodone HCl 10 MG TABS Take 1 tablet (10 mg total) by mouth every 4 (four) hours as needed (breakthrough pain.). Breakthrough pain. 180 tablet 0  . potassium chloride (K-DUR,KLOR-CON) 10 MEQ tablet Take 10 mEq by mouth daily.    . predniSONE (DELTASONE) 1 MG tablet Take 3 tablets (3 mg total) by mouth daily with breakfast. 120 tablet 3  . predniSONE (DELTASONE) 5 MG tablet Take 5 mg by mouth daily with breakfast.    . simvastatin (ZOCOR) 5 MG tablet Take 1 tablet (5 mg total) by mouth at bedtime. 30 tablet 0  . OxyCODONE (OXYCONTIN) 10 mg T12A 12 hr tablet Take 1 tablet (10 mg total) by mouth every 12 (twelve) hours. 60 tablet 0  . OxyCODONE (OXYCONTIN) 10 mg T12A 12 hr tablet Take 1 tablet (10 mg total) by mouth every 12 (twelve) hours. May refill on or after November 07, 2014 60 tablet 0  . losartan (COZAAR) 50 MG tablet TAKE 1 TABLET DAILY (Patient not taking: Reported on 11/17/2014) 90 tablet 1   No facility-administered medications prior to visit.    PAST MEDICAL HISTORY: Past Medical History  Diagnosis Date  . History of breast cancer   . Osteoporosis   . Idiopathic pulmonary fibrosis   . Bronchiectasis   . Vitamin D deficiency   . Hyperlipidemia   . Allergic rhinitis   . GERD (gastroesophageal reflux disease)   . Left knee DJD   . Anxiety   . Migraine   . Diverticulosis of colon   . Seizure disorder   . Temporal arteritis     Right eye blind, on steroids per [facility]/ Dr [name]  . CVA (cerebral infarction) 10/2007    Right thalamic   . PMR (polymyalgia rheumatica)   . Anemia   . Gait disorder   . Peripheral neuropathy   . HTN (hypertension)   . Type II or unspecified type diabetes mellitus without  mention of complication, not stated as uncontrolled     2nd to steriods  . Renal insufficiency   . Previous back surgery 10/09/12  . Polyneuropathy in other diseases classified elsewhere 02/17/2013    PAST SURGICAL HISTORY: Past Surgical History  Procedure Laterality Date  . Mastectomy    . Cholecystectomy    . Total knee arthroplasty      Left  . Tonsillectomy    . Cataract extraction      OS - Summer 2010  . Lumbar fusion  04/2010    W/Mechanical fixation - [facility]  . Spinal fusion  07/2010    T10-L2 interbody fusion / [facility]  . Colonoscopy  12/15/2011    Procedure: COLONOSCOPY;  Surgeon: [name], MD;  Location: [facility];  Service: Endoscopy;  Laterality: N/A;  . Carpal tunnel release Right     FAMILY HISTORY: Family History  Problem Relation Age of Onset  . Brain cancer Mother   . Hypertension Mother   . Dementia Father   . Hypertension Father   . Breast cancer Sister   . Multiple sclerosis Sister     SOCIAL HISTORY: History   Social History  . Marital Status: Widowed    Spouse Name: N/A  . Number of Children: 1  . Years of Education: 12+ coll.   Occupational History  . Land     retired   Social History Main Topics  . Smoking status: Never Smoker   . Smokeless tobacco: Never Used  . Alcohol Use: No     Comment: heavy drinker until 1995 - sobriety with AA  . Drug Use: No  . Sexual Activity: Not Currently   Other Topics Concern  . Not on file   Social History Narrative      Married - husband invalid with stroke - widowed in 2009. 1 chil. 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 ACP - not formerly discussed - wishes to be a full code.    Patient is right handed. Patient consumes tea 2-3  times daily.               PHYSICAL EXAM  Filed Vitals:   11/17/14 1045  BP: 125/75  Pulse: 102   There is no weight on file to calculate  BMI.  Generalized: Well developed, in no acute distress   Neurological examination  Mentation: Alert oriented to time, place, history taking. Follows all commands speech and language fluent Cranial nerve II-XII: Pupils were equal round reactive to light. Extraocular movements were full, visual field were full on confrontational test. Facial sensation and strength were normal. Uvula tongue midline. Head turning and shoulder shrug  were normal and symmetric. Motor: The motor testing reveals 5 over 5 strength of all 4 extremities. Good symmetric motor tone is noted throughout. Left foot drop noted Sensory: Sensory testing is intact to soft touch on all 4 extremities. Pinprick and vibration sensation decreased in the lower extremity extending to the knees. Position sensation intact on the right foot but not the left foot. No evidence of extinction is noted.  Coordination: Cerebellar testing reveals good finger-nose-finger and heel-to-shin bilaterally.  Gait and station: Gait is slightly wide-based. The patient uses a walker to ambulate. Tandem gait not attempted Romberg is negative. No drift is seen.  Reflexes: Deep tendon reflexes are symmetric and normal bilaterally.     DIAGNOSTIC DATA (LABS, IMAGING, TESTING) - I reviewed patient records, labs, notes, testing and imaging myself where available.  ASSESSMENT AND PLAN 74 y.o. year old female  has a past medical history of History of breast cancer; Osteoporosis; Idiopathic pulmonary fibrosis; Bronchiectasis; Vitamin D deficiency; Hyperlipidemia; Allergic rhinitis; GERD (gastroesophageal reflux disease); Left knee DJD; Anxiety; Migraine; Diverticulosis of colon; Seizure disorder; Temporal arteritis; CVA (cerebral infarction) (10/2007); PMR (polymyalgia rheumatica); Anemia; Gait disorder; Peripheral neuropathy; HTN (hypertension); Type II or unspecified type diabetes mellitus without mention of complication, not stated as uncontrolled; Renal  insufficiency; Previous back surgery (10/09/12); and Polyneuropathy in other diseases classified elsewhere (02/17/2013). here with:  1. Temporal arteritis 2. Polyneuropathy 3. Left foot drop  Patient continues to take prednisone 8 mg daily for temporal arteritis.  I will check a sedimentation rate today. I will adjust her prednisone dose if her sedimentation rate remains in normal range. The patient does feel that her neuropathy has advanced. I explained that there was no treatment for the numbness she was experiencing. Patient verbalized understanding. She will continue to take gabapentin. As explained that the slight redness in the lower extremities was most likely due to a neuropathy. Patient does have a left foot drop that has been present for several years. I will refer the patient for physical therapy for gait and balance training as well as a possible AFO brace. If the patient's symptoms worsen or she develops new symptoms she will let us know. Otherwise she will follow up in     Ward Givens, MSN, NP-C 11/17/2014, 11:25 AM [facility] [address] [place], [place] [address] [phone]  Note: This document was prepared with digital dictation and possible smart phrase technology. Any transcriptional errors that result from this process are unintentional.

## 2014-11-17 NOTE — Progress Notes (Signed)
I have read the note, and I agree with the clinical assessment and plan.  WILLIS,CHARLES KEITH   

## 2014-11-18 ENCOUNTER — Telehealth: Payer: Self-pay | Admitting: Adult Health

## 2014-11-18 LAB — SEDIMENTATION RATE: Sed Rate: 21 mm/hr (ref 0–40)

## 2014-11-18 MED ORDER — PREDNISONE 1 MG PO TABS: 2.0000 mg | ORAL_TABLET | Freq: Every day | ORAL | Status: DC

## 2014-11-18 NOTE — Telephone Encounter (Signed)
I called the patient. Her sedimentation rate remains in normal range. I will decrease her prednisone to 7 mg daily. I will recheck a sedimentation rate in one month.

## 2014-12-01 ENCOUNTER — Encounter: Payer: Self-pay | Admitting: Family Medicine

## 2014-12-01 ENCOUNTER — Ambulatory Visit (INDEPENDENT_AMBULATORY_CARE_PROVIDER_SITE_OTHER): Payer: Medicare Other | Admitting: Family Medicine

## 2014-12-01 ENCOUNTER — Ambulatory Visit (INDEPENDENT_AMBULATORY_CARE_PROVIDER_SITE_OTHER)
Admission: RE | Admit: 2014-12-01 | Discharge: 2014-12-01 | Disposition: A | Discharge status: Home / Self Care | Payer: Medicare Other | Source: Ambulatory Visit | Attending: Family Medicine | Admitting: Family Medicine

## 2014-12-01 ENCOUNTER — Ambulatory Visit: Payer: Medicare Other | Admitting: Family Medicine

## 2014-12-01 VITALS — BP 120/70 | HR 84 | Temp 98.2°F | Wt 184.0 lb

## 2014-12-01 DIAGNOSIS — R05 Cough: Secondary | ICD-10-CM

## 2014-12-01 DIAGNOSIS — M810 Age-related osteoporosis without current pathological fracture: Secondary | ICD-10-CM

## 2014-12-01 DIAGNOSIS — R059 Cough, unspecified: Secondary | ICD-10-CM

## 2014-12-01 DIAGNOSIS — M7071 Other bursitis of hip, right hip: Secondary | ICD-10-CM

## 2014-12-01 DIAGNOSIS — R509 Fever, unspecified: Secondary | ICD-10-CM | POA: Diagnosis not present

## 2014-12-01 DIAGNOSIS — J8489 Other specified interstitial pulmonary diseases: Secondary | ICD-10-CM | POA: Diagnosis not present

## 2014-12-01 MED ORDER — DENOSUMAB 60 MG/ML ~~LOC~~ SOLN
60.0000 mg | Freq: Once | SUBCUTANEOUS | Status: AC
  Administered 2014-12-01: 60 mg via SUBCUTANEOUS

## 2014-12-01 MED ORDER — METHYLPREDNISOLONE ACETATE 40 MG/ML IJ SUSP
40.0000 mg | Freq: Once | INTRAMUSCULAR | Status: AC
  Administered 2014-12-01: 40 mg via INTRA_ARTICULAR

## 2014-12-01 NOTE — Progress Notes (Signed)
Subjective:    Patient ID: [name], female    DOB: 1941-09-13, 74 y.o.   MRN: [id number]  HPI Patient seen for the following acute issues  Three-week history of cough. Mostly nonproductive. Using over-the-counter Mucinex. No fevers or chills. No dyspnea. Nonsmoker. Not aware of any wheezing. No appetite or weight changes.  Right hip pain. No injury. Duration of 2 days. Pain is lateral location. Worse with walking. No rash. Achy quality. 8 out of 10 severity. No alleviating factors. She's not tried any icing or heat. She has history of extensive lumbar back surgery. Denies any current lumbar back pain. No recent falls.  Pt has osteoporosis and is on Prolia injections every 6 months and due today.  No prior hx of adverse reaction.  Past Medical History  Diagnosis Date  . History of breast cancer   . Osteoporosis   . Idiopathic pulmonary fibrosis   . Bronchiectasis   . Vitamin D deficiency   . Hyperlipidemia   . Allergic rhinitis   . GERD (gastroesophageal reflux disease)   . Left knee DJD   . Anxiety   . Migraine   . Diverticulosis of colon   . Seizure disorder   . Temporal arteritis     Right eye blind, on steroids per [facility]/ Dr [name]  . CVA (cerebral infarction) 10/2007    Right thalamic   . PMR (polymyalgia rheumatica)   . Anemia   . Gait disorder   . Peripheral neuropathy   . HTN (hypertension)   . Type II or unspecified type diabetes mellitus without mention of complication, not stated as uncontrolled     2nd to steriods  . Renal insufficiency   . Previous back surgery 10/09/12  . Polyneuropathy in other diseases classified elsewhere 02/17/2013   Past Surgical History  Procedure Laterality Date  . Mastectomy    . Cholecystectomy    . Total knee arthroplasty      Left  . Tonsillectomy    . Cataract extraction      OS - Summer 2010  . Lumbar fusion  04/2010    W/Mechanical fixation - [facility]  . Spinal fusion  07/2010    T10-L2 interbody  fusion / [facility]  . Colonoscopy  12/15/2011    Procedure: COLONOSCOPY;  Surgeon: [name], MD;  Location: [facility];  Service: Endoscopy;  Laterality: N/A;  . Carpal tunnel release Right     reports that she has never smoked. She has never used smokeless tobacco. She reports that she does not drink alcohol or use illicit drugs. family history includes Brain cancer in her mother; Breast cancer in her sister; Dementia in her father; Hypertension in her father and mother; Multiple sclerosis in her sister. Allergies  Allergen Reactions  . Hydromorphone Other (See Comments)    Cognitive changes       Review of Systems  Constitutional: Negative for fever, chills, appetite change and unexpected weight change.  HENT: Negative for congestion and sinus pressure.   Respiratory: Positive for cough. Negative for shortness of breath.   Cardiovascular: Negative for chest pain.  Gastrointestinal: Negative for abdominal pain.  Skin: Negative for rash.       Objective:   Physical Exam  Constitutional: She appears well-developed and well-nourished.  Neck: Neck supple. No thyromegaly present.  Cardiovascular: Normal rate and regular rhythm.   Pulmonary/Chest: Effort normal and breath sounds normal. No respiratory distress. She has no wheezes. She has no rales.  Musculoskeletal: She  exhibits no edema.  Right hip reveals good range of motion. She has some tenderness laterally over the greater trochanteric bursa.          Assessment & Plan:  #1 right lateral hip pain. Suspect greater trochanteric bursitis. We discussed risk and benefits of corticosteroid injection and patient consents. Right lateral hip prepped with betadine.  Using sterile technique, injected 1 cc depomedrol and 2 cc of plain xylocaine. Pt tolerated well.  #2 cough. Suspect resolving viral bronchitis.  Pt requesting CXR.   #3 osteoporosis.  Prolia given.

## 2014-12-01 NOTE — Progress Notes (Signed)
Pre visit review using our clinic review tool, if applicable. No additional management support is needed unless otherwise documented below in the visit note. 

## 2014-12-01 NOTE — Patient Instructions (Signed)
Hip Bursitis Bursitis is a swelling and soreness (inflammation) of a fluid-filled sac (bursa). This sac overlies and protects the joints.  CAUSES   Injury.  Overuse of the muscles surrounding the joint.  Arthritis.  Gout.  Infection.  Cold weather.  Inadequate warm-up and conditioning prior to activities. The cause may not be known.  SYMPTOMS   Mild to severe irritation.  Tenderness and swelling over the outside of the hip.  Pain with motion of the hip.  If the bursa becomes infected, a fever may be present. Redness, tenderness, and warmth will develop over the hip. Symptoms usually lessen in 3 to 4 weeks with treatment, but can come back. TREATMENT If conservative treatment does not work, your caregiver may advise draining the bursa and injecting cortisone into the area. This may speed up the healing process. This may also be used as an initial treatment of choice. HOME CARE INSTRUCTIONS   Apply ice to the affected area for 15-20 minutes every 3 to 4 hours while awake for the first 2 days. Put the ice in a plastic bag and place a towel between the bag of ice and your skin.  Rest the painful joint as much as possible, but continue to put the joint through a normal range of motion at least 4 times per day. When the pain lessens, begin normal, slow movements and usual activities to help prevent stiffness of the hip.  Only take over-the-counter or prescription medicines for pain, discomfort, or fever as directed by your caregiver.  Use crutches to limit weight bearing on the hip joint, if advised.  Elevate your painful hip to reduce swelling. Use pillows for propping and cushioning your legs and hips.  Gentle massage may provide comfort and decrease swelling. SEEK IMMEDIATE MEDICAL CARE IF:   Your pain increases even during treatment, or you are not improving.  You have a fever.  You have heat and inflammation over the involved bursa.  You have any other questions or  concerns. MAKE SURE YOU:   Understand these instructions.  Will watch your condition.  Will get help right away if you are not doing well or get worse. Document Released: 03/02/2002 Document Revised: 12/03/2011 Document Reviewed: 09/29/2008 Exit[employer] Patient Information 2015 [employer]. This information is not intended to replace advice given to you by your health care provider. Make sure you discuss any questions you have with your health care provider.  

## 2014-12-02 DIAGNOSIS — M0540 Rheumatoid myopathy with rheumatoid arthritis of unspecified site: Secondary | ICD-10-CM | POA: Diagnosis not present

## 2014-12-02 DIAGNOSIS — M0589 Other rheumatoid arthritis with rheumatoid factor of multiple sites: Secondary | ICD-10-CM | POA: Diagnosis not present

## 2014-12-03 ENCOUNTER — Telehealth: Payer: Self-pay | Admitting: Family Medicine

## 2014-12-03 NOTE — Telephone Encounter (Signed)
Pt informed

## 2014-12-03 NOTE — Telephone Encounter (Signed)
Pt had lung xray and would like results.

## 2014-12-07 ENCOUNTER — Other Ambulatory Visit: Payer: Self-pay | Admitting: Family Medicine

## 2014-12-08 ENCOUNTER — Telehealth: Payer: Self-pay | Admitting: Family Medicine

## 2014-12-08 ENCOUNTER — Other Ambulatory Visit: Payer: Self-pay | Admitting: Family Medicine

## 2014-12-08 DIAGNOSIS — M25551 Pain in right hip: Secondary | ICD-10-CM

## 2014-12-08 DIAGNOSIS — M810 Age-related osteoporosis without current pathological fracture: Secondary | ICD-10-CM

## 2014-12-08 NOTE — Addendum Note (Signed)
Addended by: Marcina Millard on: 12/08/2014 05:17 PM   Modules accepted: Orders

## 2014-12-08 NOTE — Telephone Encounter (Signed)
Pt said she was in to see Dr Elease Hashimoto last week and was given a shot and was told if not better he would order an xray. Pt is calling today to say she is still having the pain and would like to have that xray. Pt said she is not able to lift her leg and it hurt to the touch .   Phone number [phone]

## 2014-12-08 NOTE — Telephone Encounter (Signed)
Xray is ordered. Left message for patient on Vm

## 2014-12-08 NOTE — Telephone Encounter (Signed)
Go ahead and get right hip film

## 2014-12-09 ENCOUNTER — Other Ambulatory Visit: Payer: Self-pay | Admitting: Family Medicine

## 2014-12-09 ENCOUNTER — Ambulatory Visit (INDEPENDENT_AMBULATORY_CARE_PROVIDER_SITE_OTHER)
Admission: RE | Admit: 2014-12-09 | Discharge: 2014-12-09 | Disposition: A | Discharge status: Home / Self Care | Payer: Medicare Other | Source: Ambulatory Visit | Attending: Family Medicine | Admitting: Family Medicine

## 2014-12-09 DIAGNOSIS — M25551 Pain in right hip: Secondary | ICD-10-CM

## 2014-12-09 DIAGNOSIS — M810 Age-related osteoporosis without current pathological fracture: Secondary | ICD-10-CM

## 2014-12-17 ENCOUNTER — Telehealth: Payer: Self-pay | Admitting: Adult Health

## 2014-12-17 DIAGNOSIS — M316 Other giant cell arteritis: Secondary | ICD-10-CM

## 2014-12-17 NOTE — Telephone Encounter (Signed)
Please have patient come in next week for lab work to check sedimentation rate. Order in epic.

## 2014-12-22 DIAGNOSIS — N179 Acute kidney failure, unspecified: Secondary | ICD-10-CM | POA: Diagnosis not present

## 2014-12-22 DIAGNOSIS — I129 Hypertensive chronic kidney disease with stage 1 through stage 4 chronic kidney disease, or unspecified chronic kidney disease: Secondary | ICD-10-CM | POA: Diagnosis not present

## 2014-12-22 DIAGNOSIS — R600 Localized edema: Secondary | ICD-10-CM | POA: Diagnosis not present

## 2014-12-22 DIAGNOSIS — N183 Chronic kidney disease, stage 3 (moderate): Secondary | ICD-10-CM | POA: Diagnosis not present

## 2014-12-22 NOTE — Telephone Encounter (Signed)
Spoke with patient and informed her to come back in to have her sedimentation rate checked again, patient was given the hours for lab and confirmed that she would come in.

## 2014-12-23 ENCOUNTER — Other Ambulatory Visit: Payer: Self-pay | Admitting: Family Medicine

## 2014-12-23 ENCOUNTER — Other Ambulatory Visit (INDEPENDENT_AMBULATORY_CARE_PROVIDER_SITE_OTHER): Payer: Self-pay

## 2014-12-23 DIAGNOSIS — M316 Other giant cell arteritis: Secondary | ICD-10-CM | POA: Diagnosis not present

## 2014-12-23 DIAGNOSIS — Z0289 Encounter for other administrative examinations: Secondary | ICD-10-CM

## 2014-12-24 ENCOUNTER — Other Ambulatory Visit: Payer: Self-pay | Admitting: Adult Health

## 2014-12-24 LAB — SEDIMENTATION RATE: Sed Rate: 9 mm/hr (ref 0–40)

## 2014-12-24 MED ORDER — PREDNISONE 1 MG PO TABS: 1.0000 mg | ORAL_TABLET | Freq: Every day | ORAL | Status: DC

## 2014-12-25 ENCOUNTER — Other Ambulatory Visit: Payer: Self-pay

## 2014-12-25 MED ORDER — GABAPENTIN 300 MG PO CAPS: ORAL_CAPSULE | ORAL | Status: DC

## 2014-12-29 DIAGNOSIS — M810 Age-related osteoporosis without current pathological fracture: Secondary | ICD-10-CM | POA: Diagnosis not present

## 2014-12-29 DIAGNOSIS — M316 Other giant cell arteritis: Secondary | ICD-10-CM | POA: Diagnosis not present

## 2014-12-29 DIAGNOSIS — M150 Primary generalized (osteo)arthritis: Secondary | ICD-10-CM | POA: Diagnosis not present

## 2014-12-29 DIAGNOSIS — M0589 Other rheumatoid arthritis with rheumatoid factor of multiple sites: Secondary | ICD-10-CM | POA: Diagnosis not present

## 2015-01-06 ENCOUNTER — Encounter: Payer: Self-pay | Admitting: Family Medicine

## 2015-01-06 ENCOUNTER — Ambulatory Visit (INDEPENDENT_AMBULATORY_CARE_PROVIDER_SITE_OTHER): Payer: Medicare Other | Admitting: Family Medicine

## 2015-01-06 VITALS — BP 130/80 | HR 83 | Temp 98.0°F | Wt 175.0 lb

## 2015-01-06 DIAGNOSIS — I10 Essential (primary) hypertension: Secondary | ICD-10-CM | POA: Diagnosis not present

## 2015-01-06 DIAGNOSIS — M549 Dorsalgia, unspecified: Secondary | ICD-10-CM

## 2015-01-06 DIAGNOSIS — E785 Hyperlipidemia, unspecified: Secondary | ICD-10-CM | POA: Diagnosis not present

## 2015-01-06 DIAGNOSIS — G8929 Other chronic pain: Secondary | ICD-10-CM

## 2015-01-06 LAB — LIPID PANEL
Cholesterol: 142 mg/dL (ref 0–200)
HDL: 48.5 mg/dL
LDL Cholesterol: 72 mg/dL (ref 0–99)
NonHDL: 93.5
Total CHOL/HDL Ratio: 3
Triglycerides: 107 mg/dL (ref 0.0–149.0)
VLDL: 21.4 mg/dL (ref 0.0–40.0)

## 2015-01-06 LAB — HEPATIC FUNCTION PANEL
ALBUMIN: 3.8 g/dL (ref 3.5–5.2)
ALT: 21 U/L (ref 0–35)
AST: 23 U/L (ref 0–37)
Alkaline Phosphatase: 95 U/L (ref 39–117)
BILIRUBIN DIRECT: 0.2 mg/dL (ref 0.0–0.3)
BILIRUBIN TOTAL: 1 mg/dL (ref 0.2–1.2)
Total Protein: 7.4 g/dL (ref 6.0–8.3)

## 2015-01-06 LAB — BASIC METABOLIC PANEL WITH GFR
BUN: 23 mg/dL (ref 6–23)
CO2: 31 meq/L (ref 19–32)
Calcium: 9.6 mg/dL (ref 8.4–10.5)
Chloride: 102 meq/L (ref 96–112)
Creatinine, Ser: 1.26 mg/dL — ABNORMAL HIGH (ref 0.40–1.20)
GFR: 44.04 mL/min — ABNORMAL LOW
Glucose, Bld: 110 mg/dL — ABNORMAL HIGH (ref 70–99)
Potassium: 4.1 meq/L (ref 3.5–5.1)
Sodium: 139 meq/L (ref 135–145)

## 2015-01-06 MED ORDER — OXYCODONE HCL ER 10 MG PO T12A: 10.0000 mg | EXTENDED_RELEASE_TABLET | Freq: Two times a day (BID) | ORAL | Status: DC

## 2015-01-06 NOTE — Progress Notes (Signed)
Pre visit review using our clinic review tool, if applicable. No additional management support is needed unless otherwise documented below in the visit note. 

## 2015-01-06 NOTE — Progress Notes (Signed)
Subjective:    Patient ID: [name], female    DOB: [birth year], 74 y.o.   MRN: [id number]  HPI Patient seen for medical follow-up. She has multiple chronic problems including history of hyperlipidemia, hypertension, pulmonary fibrosis, GERD, chronic back pain, osteoporosis, history of breast cancer, history of stroke, history of chronic kidney disease, history of temporal arteritis.  She is followed by neurology and they are tapering off her prednisone. 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  She has a problem of type 2 diabetes list in her problem list but not clear that this was firmly diagnosed. She is not taking anything for hyperglycemia. Medications reviewed. She is due for repeat labs  Chronic back pain treated with OxyContin. She supplements with immediate release oxycodone as needed. Requesting refills today of OxyContin. She does have occasional constipation issues. Generally back pain fairly well controlled. Still has breakthrough pain intermittently  Past Medical History  Diagnosis Date  . History of breast cancer   . Osteoporosis   . Idiopathic pulmonary fibrosis   . Bronchiectasis   . Vitamin D deficiency   . Hyperlipidemia   . Allergic rhinitis   . GERD (gastroesophageal reflux disease)   . Left knee DJD   . Anxiety   . Migraine   . Diverticulosis of colon   . Seizure disorder   . Temporal arteritis     Right eye blind, on steroids per [facility]/ Dr [name]  . CVA (cerebral infarction) 10/2007    Right thalamic   . PMR (polymyalgia rheumatica)   . Anemia   . Gait disorder   . Peripheral neuropathy   . HTN (hypertension)   . Type II or unspecified type diabetes mellitus without mention of complication, not stated as uncontrolled     2nd to steriods  . Renal insufficiency   . Previous back surgery 10/09/12  . Polyneuropathy in other diseases classified elsewhere 02/17/2013   Past Surgical History  Procedure Laterality Date  . Mastectomy     . Cholecystectomy    . Total knee arthroplasty      Left  . Tonsillectomy    . Cataract extraction      OS - Summer 2010  . Lumbar fusion  04/2010    W/Mechanical fixation - [facility]  . Spinal fusion  07/2010    T10-L2 interbody fusion / [facility]  . Colonoscopy  12/15/2011    Procedure: COLONOSCOPY;  Surgeon: [name], MD;  Location: [facility];  Service: Endoscopy;  Laterality: N/A;  . Carpal tunnel release Right     reports that she has never smoked. She has never used smokeless tobacco. She reports that she does not drink alcohol or use illicit drugs. family history includes Brain cancer in her mother; Breast cancer in her sister; Dementia in her father; Hypertension in her father and mother; Multiple sclerosis in her sister. Allergies  Allergen Reactions  . Hydromorphone Other (See Comments)    Cognitive changes       Review of Systems  Constitutional: Positive for fatigue. Negative for fever, chills and unexpected weight change.  Eyes: Negative for visual disturbance.  Respiratory: Negative for cough, chest tightness, shortness of breath and wheezing.   Cardiovascular: Negative for chest pain, palpitations and leg swelling.  Musculoskeletal: Positive for back pain.  Neurological: Negative for dizziness, seizures, syncope, weakness, light-headedness and headaches.       Objective:   Physical Exam  Constitutional: She is oriented to person, place,  and time. She appears well-developed and well-nourished. No distress.  Neck: Neck supple. No thyromegaly present.  Cardiovascular: Normal rate and regular rhythm.   Pulmonary/Chest: Effort normal and breath sounds normal. No respiratory distress. She has no wheezes.  She has bibasal crackles which are likely related her chronic pulmonary fibrosis  Musculoskeletal: She exhibits no edema.  Neurological: She is alert and oriented to person, place, and time.          Assessment & Plan:  #1  hyperlipidemia. Check repeat lipid and hepatic panel #2 hypertension stable and at goal #3 chronic back pain. She has been on this chronically for several years and reasonably well controlled.  Refill OxyContin for 3 months #4 temporal arteritis followed by neurology currently being tapered off prednisone

## 2015-01-10 ENCOUNTER — Ambulatory Visit: Payer: Medicare Other | Attending: Adult Health

## 2015-01-10 DIAGNOSIS — R293 Abnormal posture: Secondary | ICD-10-CM | POA: Diagnosis not present

## 2015-01-10 DIAGNOSIS — R262 Difficulty in walking, not elsewhere classified: Secondary | ICD-10-CM | POA: Diagnosis not present

## 2015-01-10 DIAGNOSIS — R279 Unspecified lack of coordination: Secondary | ICD-10-CM | POA: Diagnosis not present

## 2015-01-10 DIAGNOSIS — M6281 Muscle weakness (generalized): Secondary | ICD-10-CM | POA: Insufficient documentation

## 2015-01-11 ENCOUNTER — Ambulatory Visit: Payer: Medicare Other | Admitting: Adult Health

## 2015-01-11 NOTE — Therapy (Signed)
[facility] [address] [place], [place], [address] Phone: [phone]   Fax:  [phone]  Physical Therapy Evaluation  Patient Details  Name: [name] MRN: [id number] Date of Birth: 1941-03-03 Referring Provider:  Trudie Buckler, NP  Encounter Date: 01/10/2015      PT End of Session - 01/10/15 1515    Visit Number 1   Number of Visits 17   Date for PT Re-Evaluation 03/11/15   Authorization Type Medicare Primary-G code required; Tricare secondary-no PTA   PT Start Time 1020   PT Stop Time 1102   PT Time Calculation (min) 42 min      Past Medical History  Diagnosis Date  . History of breast cancer   . Osteoporosis   . Idiopathic pulmonary fibrosis   . Bronchiectasis   . Vitamin D deficiency   . Hyperlipidemia   . Allergic rhinitis   . GERD (gastroesophageal reflux disease)   . Left knee DJD   . Anxiety   . Migraine   . Diverticulosis of colon   . Seizure disorder   . Temporal arteritis     Right eye blind, on steroids per [facility]/ Dr [name]  . CVA (cerebral infarction) 10/2007    Right thalamic   . PMR (polymyalgia rheumatica)   . Anemia   . Gait disorder   . Peripheral neuropathy   . HTN (hypertension)   . Type II or unspecified type diabetes mellitus without mention of complication, not stated as uncontrolled     2nd to steriods  . Renal insufficiency   . Previous back surgery 10/09/12  . Polyneuropathy in other diseases classified elsewhere 02/17/2013    Past Surgical History  Procedure Laterality Date  . Mastectomy    . Cholecystectomy    . Total knee arthroplasty      Left  . Tonsillectomy    . Cataract extraction      OS - Summer 2010  . Lumbar fusion  04/2010    W/Mechanical fixation - [facility]  . Spinal fusion  07/2010    T10-L2 interbody fusion / [facility]  . Colonoscopy  12/15/2011    Procedure: COLONOSCOPY;  Surgeon: [name], MD;  Location: [facility]  ENDOSCOPY;  Service: Endoscopy;  Laterality: N/A;  . Carpal tunnel release Right     There were no vitals filed for this visit.  Visit Diagnosis:  Posture abnormality  Muscle weakness (generalized)  Lack of coordination  Difficulty walking      Subjective Assessment - 01/10/15 1029    Subjective Pt presents with hx of polyneuropathy and occasional left foot drop.She reports feeling unsteady at times. She has been using a rollator walker since 2011 following spinal surgeries (low back and neck). She reports she used to have falls but has slowed down and she has been using the rollator more frequently and believes this has decreased her falls.    Pertinent History Hx of 3 TIAs or ministrokes prior to 2009; hx of 1 seizure 2009   Patient Stated Goals pt would like to walk with a cane, and improve balance   Currently in Pain? No/denies            [facility] PT Assessment - 01/11/15 0001    Assessment   Medical Diagnosis abnormality of gait; left foot drop    Onset Date --  2012   Prior Therapy --  Pt had PT for balance 2 years ago at [facility]  Precautions   Precautions Fall  hx of multiple spinal surgeries, hx of seizure   Balance Screen   Has the patient fallen in the past 6 months No   Has the patient had a decrease in activity level because of a fear of falling?  Yes   Is the patient reluctant to leave their home because of a fear of falling?  No   Home Environment   Living Enviornment Private residence   Living Arrangements Alone   Available Help at Discharge Neighbor   Type of [place] One level   [place] - 4 wheels;Shower seat - built in;Walker - 2 wheels   Prior Function   Level of Independence Independent with basic ADLs;Requires assistive device for independence   Leisure going out to lunch with friends, going to the beach   Observation/Other Assessments   Focus on Therapeutic Outcomes  (FOTO)  --  not administered upon arrival   Posture/Postural Control   Posture/Postural Control Postural limitations   Postural Limitations Rounded Shoulders;Forward head;Increased thoracic kyphosis;Left pelvic obliquity;Weight shift left   ROM / Strength   AROM / PROM / Strength Strength  L ankle dorsiflexor 3-/5; R 4/5, plantarflexion L 3/5, R 4/5   Ambulation/Gait   Ambulation/Gait Yes   Ambulation/Gait Assistance 6: Modified independent (Device/Increase time);4: Min guard  Min Guard without assistive device   Ambulation Distance (Feet) 100 Feet   Assistive device 4-wheeled walker;None   Gait Pattern Decreased dorsiflexion - left;Decreased weight shift to left;Left flexed knee in stance;Lateral trunk lean to left;Trunk flexed  did not demonstrate foot drop during short walk   Gait velocity 2.74   Standardized Balance Assessment   Standardized Balance Assessment Timed Up and Go Test;Berg Balance Test   Berg Balance Test   Sit to Stand Able to stand  independently using hands   Standing Unsupported Able to stand 2 minutes with supervision   Sitting with Back Unsupported but Feet Supported on Floor or Stool Able to sit safely and securely 2 minutes   Stand to Sit Sits safely with minimal use of hands   Transfers Able to transfer safely, minor use of hands   Standing Unsupported with Eyes Closed Able to stand 10 seconds with supervision   Standing Ubsupported with Feet Together Able to place feet together independently and stand 1 minute safely   From Standing, Reach Forward with Outstretched Arm Can reach confidently >25 cm (10")   From Standing Position, Pick up Object from Floor Able to pick up shoe, needs supervision   From Standing Position, Turn to Look Behind Over each Shoulder Turn sideways only but maintains balance   Turn 360 Degrees Needs close supervision or verbal cueing  6 seconds right; 8 seconds left   Standing Unsupported, Alternately Place Feet on Step/Stool Able to  complete >2 steps/needs minimal assist   Standing Unsupported, One Foot in Front Needs help to step but can hold 15 seconds   Standing on One Leg Tries to lift leg/unable to hold 3 seconds but remains standing independently   Total Score 38   Timed Up and Go Test   TUG Normal TUG   Normal TUG (seconds) 11.55  with rollator; 17.22 seconds without device w MIN guard A                             PT Short Term Goals - 01/10/15  [place] #1   Title Pt will demonstrate correct performance of HEP to address balance impairment and postural impairment. Target 02/11/15   PT SHORT TERM GOAL #2   Title Pt will increase score on Berg Balance Test to 43/56 for improving balance. Target 02/11/15   PT SHORT TERM GOAL #3   Title Pt will perform TUG safely with single point cane for increased efficiency of household ambulation and progress toward using the cane in her home. Target 02/11/15   PT SHORT TERM GOAL #4   Title Pt will ambulate 200' with single point cane on indoor level surface MOD I for progress to household ambulation with cane. Target 02/11/15           PT Long Term Goals - 01/11/15 1747    PT LONG TERM GOAL #1   Title Pt will verbalize understanding of fall prevention strategies in home environment. Target 03/11/15   PT LONG TERM GOAL #2   Title Pt will verbalize understanding of CVA warning signs and risk factors due to her reports of having hx of "mini-strokes." Target 03/11/15   PT LONG TERM GOAL #3   Title Pt will increase Berg Balance Test score to 48/56 for decreased fall risk. Target 03/11/15   PT LONG TERM GOAL #4   Title Pt will ambulate 200' on outdoor concrete surfaces with a cane and supervision for increased independence when out in the community with friends or family. Target 03/11/15   PT LONG TERM GOAL #5   Title Pt will safely perform TUG with cane in <13.5 seconds for decreased fall risk. Target 03/11/15               Plan -  01/10/15 1516    Clinical Impression Statement Pt is a 74 year old female with slowly progressive decline in mobility. She is limited by her postural impairments and possible leg length discrepancy vs pelvic obliquity; in addition her neuropathy contributes to her balance impairment. Pt has increased difficulty with eyes closed. She reports foot drop which is more apparent with fatigue or longer distances and she demonstrates fall risk based on balance testing.    Pt will benefit from skilled therapeutic intervention in order to improve on the following deficits Abnormal gait;Decreased coordination;Improper body mechanics;Decreased activity tolerance;Decreased strength;Postural dysfunction;Decreased balance;Difficulty walking   Rehab Potential Good   Clinical Impairments Affecting Rehab Potential chronic nature of postural impairment and hx of multiple spinal surgeries   PT Frequency 2x / week   PT Duration 8 weeks   PT Treatment/Interventions ADLs/Self Care Home Management;Gait training;Therapeutic exercise;Patient/family education;Balance training;Neuromuscular re-education;Therapeutic activities;DME Instruction   PT Next Visit Plan HEP to address posture and balance--may utilize corner balance activities   Consulted and Agree with Plan of Care Patient         Problem List Patient Active Problem List   Diagnosis Date Noted  . Obesity (BMI 30-39.9) 04/06/2014  . Shortness of breath 07/14/2013  . Bone cyst 07/14/2013  . Polyneuropathy in other diseases classified elsewhere 02/17/2013  . Chronic back pain 12/09/2012  . Peripheral edema 09/25/2012  . Anemia due to chronic illness 09/01/2012  . Cervicalgia 07/11/2012  . Disturbance of skin sensation 07/11/2012  . Temporal arteritis 04/01/2012  . Lower GI bleed 12/24/2011  . Rectal bleed 12/13/2011  . Chronic renal insufficiency, stage II (mild) 04/04/2011  . LUMBAR RADICULOPATHY, LEFT 04/18/2009  . Essential hypertension 12/30/2007  .  SEIZURE DISORDER 12/30/2007  . CEREBROVASCULAR  ACCIDENT, HX OF 12/30/2007  . Hyperlipidemia 10/08/2007  . GERD 10/08/2007  . DIVERTICULOSIS, COLON 10/08/2007  . BRONCHIECTASIS 06/26/2007  . PULMONARY FIBROSIS 05/17/2007  . Osteoporosis 05/17/2007  . BREAST CANCER, HX OF 05/17/2007  . MASTECTOMY, LEFT, HX OF 05/17/2007    Delrae Sawyers, PT,DPT,NCS 01/11/2015 5:55 PM Phone [phone] FAX [phone]         [facility] [address] [place], [place], [address] Phone: [phone]   Fax:  [phone]

## 2015-01-18 ENCOUNTER — Ambulatory Visit: Payer: Medicare Other

## 2015-01-18 DIAGNOSIS — R262 Difficulty in walking, not elsewhere classified: Secondary | ICD-10-CM | POA: Diagnosis not present

## 2015-01-18 DIAGNOSIS — R279 Unspecified lack of coordination: Secondary | ICD-10-CM

## 2015-01-18 DIAGNOSIS — M6281 Muscle weakness (generalized): Secondary | ICD-10-CM | POA: Diagnosis not present

## 2015-01-18 DIAGNOSIS — R293 Abnormal posture: Secondary | ICD-10-CM

## 2015-01-18 NOTE — Therapy (Signed)
[facility] [address] [place], [place], [address] Phone: [phone]   Fax:  [phone]  Physical Therapy Treatment  Patient Details  Name: [name] MRN: [id number] Date of Birth: 01-05-1941 Referring Provider:  Eulas Post, MD  Encounter Date: 01/18/2015      PT End of Session - 01/18/15 1746    Visit Number 2   Number of Visits 17   Date for PT Re-Evaluation 03/11/15   Authorization Type Medicare Primary-G code required; Tricare secondary-no PTA   PT Start Time 1450   PT Stop Time 1530   PT Time Calculation (min) 40 min      Past Medical History  Diagnosis Date  . History of breast cancer   . Osteoporosis   . Idiopathic pulmonary fibrosis   . Bronchiectasis   . Vitamin D deficiency   . Hyperlipidemia   . Allergic rhinitis   . GERD (gastroesophageal reflux disease)   . Left knee DJD   . Anxiety   . Migraine   . Diverticulosis of colon   . Seizure disorder   . Temporal arteritis     Right eye blind, on steroids per [facility]/ Dr [name]  . CVA (cerebral infarction) 10/2007    Right thalamic   . PMR (polymyalgia rheumatica)   . Anemia   . Gait disorder   . Peripheral neuropathy   . HTN (hypertension)   . Type II or unspecified type diabetes mellitus without mention of complication, not stated as uncontrolled     2nd to steriods  . Renal insufficiency   . Previous back surgery 10/09/12  . Polyneuropathy in other diseases classified elsewhere 02/17/2013    Past Surgical History  Procedure Laterality Date  . Mastectomy    . Cholecystectomy    . Total knee arthroplasty      Left  . Tonsillectomy    . Cataract extraction      OS - Summer 2010  . Lumbar fusion  04/2010    W/Mechanical fixation - [facility]  . Spinal fusion  07/2010    T10-L2 interbody fusion / [facility]  . Colonoscopy  12/15/2011    Procedure: COLONOSCOPY;  Surgeon: [name], MD;  Location: [facility]  ENDOSCOPY;  Service: Endoscopy;  Laterality: N/A;  . Carpal tunnel release Right     There were no vitals filed for this visit.  Visit Diagnosis:  Posture abnormality  Muscle weakness (generalized)  Lack of coordination      Subjective Assessment - 01/18/15 1454    Subjective Pt reports feeling more tired today. Didn't rest well last night.         The patient was taught, performed, and was provided with a home exercise program to address posture, balance impairment and spinal extensor and anterior tib weakness. See pt instructions for details.                            PT Education - 01/18/15 1746    Education provided Yes   Education Details HEP-see pt instructions   Person(s) Educated Patient   Methods Explanation;Demonstration;Verbal cues;Handout   Comprehension Verbalized understanding;Returned demonstration          PT Short Term Goals - 01/10/15 1525    PT SHORT TERM GOAL #1   Title Pt will demonstrate correct performance of HEP to address balance impairment and postural impairment. Target 02/11/15   PT SHORT TERM GOAL #2  Title Pt will increase score on Berg Balance Test to 43/56 for improving balance. Target 02/11/15   PT SHORT TERM GOAL #3   Title Pt will perform TUG safely with single point cane for increased efficiency of household ambulation and progress toward using the cane in her home. Target 02/11/15   PT SHORT TERM GOAL #4   Title Pt will ambulate 200' with single point cane on indoor level surface MOD I for progress to household ambulation with cane. Target 02/11/15           PT Long Term Goals - 01/11/15 1747    PT LONG TERM GOAL #1   Title Pt will verbalize understanding of fall prevention strategies in home environment. Target 03/11/15   PT LONG TERM GOAL #2   Title Pt will verbalize understanding of CVA warning signs and risk factors due to her reports of having hx of "mini-strokes." Target 03/11/15   PT LONG TERM GOAL #3    Title Pt will increase Berg Balance Test score to 48/56 for decreased fall risk. Target 03/11/15   PT LONG TERM GOAL #4   Title Pt will ambulate 200' on outdoor concrete surfaces with a cane and supervision for increased independence when out in the community with friends or family. Target 03/11/15   PT LONG TERM GOAL #5   Title Pt will safely perform TUG with cane in <13.5 seconds for decreased fall risk. Target 03/11/15               Plan - 01/18/15 1747    Clinical Impression Statement Pt was provided with HEP this session. In addition, pt was observed performing sit<>stand with and without shoes on (R shoe has a lift built on to it). Pt demonstrated improved posteure and improved equality of weight bearing between right and left lower extremity without the shoes. In addition pt is able to partially correct her spinal curvature with mirror. Continue per POC   PT Next Visit Plan Ask how HEP is going. Continue posture trianing--stretching and strengthening as appropriate. Equal weight bearing and balance training.   Consulted and Agree with Plan of Care Patient        Problem List Patient Active Problem List   Diagnosis Date Noted  . Obesity (BMI 30-39.9) 04/06/2014  . Shortness of breath 07/14/2013  . Bone cyst 07/14/2013  . Polyneuropathy in other diseases classified elsewhere 02/17/2013  . Chronic back pain 12/09/2012  . Peripheral edema 09/25/2012  . Anemia due to chronic illness 09/01/2012  . Cervicalgia 07/11/2012  . Disturbance of skin sensation 07/11/2012  . Temporal arteritis 04/01/2012  . Lower GI bleed 12/24/2011  . Rectal bleed 12/13/2011  . Chronic renal insufficiency, stage II (mild) 04/04/2011  . LUMBAR RADICULOPATHY, LEFT 04/18/2009  . Essential hypertension 12/30/2007  . SEIZURE DISORDER 12/30/2007  . CEREBROVASCULAR ACCIDENT, HX OF 12/30/2007  . Hyperlipidemia 10/08/2007  . GERD 10/08/2007  . DIVERTICULOSIS, COLON 10/08/2007  . BRONCHIECTASIS  06/26/2007  . PULMONARY FIBROSIS 05/17/2007  . Osteoporosis 05/17/2007  . BREAST CANCER, HX OF 05/17/2007  . MASTECTOMY, LEFT, HX OF 05/17/2007    Delrae Sawyers, PT,DPT,NCS 01/18/2015 5:51 PM Phone [phone] FAX [phone]          [facility] [address] [place], [place], [address] Phone: [phone]   Fax:  [phone]

## 2015-01-18 NOTE — Patient Instructions (Signed)
[employer] is the name of the shoe store I mentioned--recommend shoes for ankle stability  Sitting to standing: With feet apart, practice sitting and standing from a chair, 10x in front of mirror. Pay special attention to keep your posture upright, and keep your weight even between your feet. Don't wear lift shoes for this one.  Bow -Stand at [employer]. Hips square with counter, and feet apart. With a chair behind you. Lean/bend forward, keeping head centered (in line with belly button) then straighten up tall again, using the muscles in your back to do the movement. Perform 10x daily.  ANKLE: Pump - Sitting   With involved leg straight and off floor, bend toes toward floor, then toward ceiling. Perform while watching TV. Copyright  VHI. All rights reserved.    Weight Shift: Anterior / Posterior (Limits of Stability)   Stand next to the counter, with a chair behind you. hold counter. Slowly shift weight backward until toes begin to rise off floor. Return to starting position. Shift weight slowly forward until heels begin to rise off floor. Hold each position 1 seconds. Repeat 20 times per session. Do 1-2 sessions per day. Copyright  VHI. All rights reserved.    Feet Together, Head Motion - Eyes Closed   Stand in a corner with your rollator in front of you with one finger on the rollator. With eyes closed and feet together stand for 30 seconds, static. Then, move head slowly, up and down 20x, then side to side 20x. Perform 2 sessions daily. Copyright  VHI. All rights reserved.    AMBULATION: Side Step   Hold counter and step sideways. When you get to the end of the counter, repeat in opposite direction. Perform 3 laps at counter daily.  Copyright  VHI. All rights reserved.

## 2015-01-19 ENCOUNTER — Ambulatory Visit: Payer: Medicare Other

## 2015-01-19 DIAGNOSIS — M6281 Muscle weakness (generalized): Secondary | ICD-10-CM

## 2015-01-19 DIAGNOSIS — R293 Abnormal posture: Secondary | ICD-10-CM

## 2015-01-19 DIAGNOSIS — R262 Difficulty in walking, not elsewhere classified: Secondary | ICD-10-CM | POA: Diagnosis not present

## 2015-01-19 DIAGNOSIS — R279 Unspecified lack of coordination: Secondary | ICD-10-CM | POA: Diagnosis not present

## 2015-01-19 NOTE — Therapy (Signed)
[facility] [address] [place], [place], [address] Phone: [phone]   Fax:  [phone]  Physical Therapy Treatment  Patient Details  Name: [name] MRN: [id number] Date of Birth: 09/08/1941 Referring Provider:  Eulas Post, MD  Encounter Date: 01/19/2015      PT End of Session - 01/19/15 1451    Visit Number 3   Number of Visits 17   Date for PT Re-Evaluation 03/11/15   Authorization Type Medicare Primary-G code required; Tricare secondary-no PTA   PT Start Time 1319   PT Stop Time 1400   PT Time Calculation (min) 41 min      Past Medical History  Diagnosis Date  . History of breast cancer   . Osteoporosis   . Idiopathic pulmonary fibrosis   . Bronchiectasis   . Vitamin D deficiency   . Hyperlipidemia   . Allergic rhinitis   . GERD (gastroesophageal reflux disease)   . Left knee DJD   . Anxiety   . Migraine   . Diverticulosis of colon   . Seizure disorder   . Temporal arteritis     Right eye blind, on steroids per [facility]/ Dr [name]  . CVA (cerebral infarction) 10/2007    Right thalamic   . PMR (polymyalgia rheumatica)   . Anemia   . Gait disorder   . Peripheral neuropathy   . HTN (hypertension)   . Type II or unspecified type diabetes mellitus without mention of complication, not stated as uncontrolled     2nd to steriods  . Renal insufficiency   . Previous back surgery 10/09/12  . Polyneuropathy in other diseases classified elsewhere 02/17/2013    Past Surgical History  Procedure Laterality Date  . Mastectomy    . Cholecystectomy    . Total knee arthroplasty      Left  . Tonsillectomy    . Cataract extraction      OS - Summer 2010  . Lumbar fusion  04/2010    W/Mechanical fixation - [facility]  . Spinal fusion  07/2010    T10-L2 interbody fusion / [facility]  . Colonoscopy  12/15/2011    Procedure: COLONOSCOPY;  Surgeon: [name], MD;  Location: [facility]  ENDOSCOPY;  Service: Endoscopy;  Laterality: N/A;  . Carpal tunnel release Right     There were no vitals filed for this visit.  Visit Diagnosis:  Posture abnormality  Muscle weakness (generalized)      Subjective Assessment - 01/19/15 1322    Subjective Pt reports she did her exercises this morning and she is feeling more rested today.   Currently in Pain? No/denies     All exercises performed today with mirror with emphasis on posture education and correction. Also had thorough discussion regarding the factors of muscle weakness vs muscle tightness vs habit in regards to posture and the importance of addressing habitual poor posture throughout the day and correcting it. Pt verbalized understanding.  Standing sidebending stretch toward the right with LUE overhead reach x10 with RUE support; again x10 with CGA without UE support  3x30 seconds seated spinal flexor stretch with small ball supporting back  3x30 seconds door fram spinal flexor stretch  3x10 standing R sidebending with 3# hand weights with mirror for feeback and assist to decreased weight shift compensation.  Sit 10 stands x10 with emphasis on equal weight bearing, pt continues to prefer initiating and terminating with increased weight on RLE.  Forward leaning with BUE  pressing equally though BLE with mirror and tactile cues for equal weight bearing/midline position--progressed to increased forward lean distance reaching for a chair in front of her.                       PT Education - 01/18/15 1746    Education provided Yes   Education Details HEP-see pt instructions   Person(s) Educated Patient   Methods Explanation;Demonstration;Verbal cues;Handout   Comprehension Verbalized understanding;Returned demonstration          PT Short Term Goals - 01/10/15 1525    PT SHORT TERM GOAL #1   Title Pt will demonstrate correct performance of HEP to address balance impairment and postural impairment.  Target 02/11/15   PT SHORT TERM GOAL #2   Title Pt will increase score on Berg Balance Test to 43/56 for improving balance. Target 02/11/15   PT SHORT TERM GOAL #3   Title Pt will perform TUG safely with single point cane for increased efficiency of household ambulation and progress toward using the cane in her home. Target 02/11/15   PT SHORT TERM GOAL #4   Title Pt will ambulate 200' with single point cane on indoor level surface MOD I for progress to household ambulation with cane. Target 02/11/15           PT Long Term Goals - 01/11/15 1747    PT LONG TERM GOAL #1   Title Pt will verbalize understanding of fall prevention strategies in home environment. Target 03/11/15   PT LONG TERM GOAL #2   Title Pt will verbalize understanding of CVA warning signs and risk factors due to her reports of having hx of "mini-strokes." Target 03/11/15   PT LONG TERM GOAL #3   Title Pt will increase Berg Balance Test score to 48/56 for decreased fall risk. Target 03/11/15   PT LONG TERM GOAL #4   Title Pt will ambulate 200' on outdoor concrete surfaces with a cane and supervision for increased independence when out in the community with friends or family. Target 03/11/15   PT LONG TERM GOAL #5   Title Pt will safely perform TUG with cane in <13.5 seconds for decreased fall risk. Target 03/11/15               Plan - 01/19/15 1451    Clinical Impression Statement Pt demonstrated improved posture awareness today compared to session yesterday. She is very motivated to exercise and improve her posture and balance. She is expected to progress to goals.    PT Next Visit Plan Hams and glut strength, spinal extensor strength        Problem List Patient Active Problem List   Diagnosis Date Noted  . Obesity (BMI 30-39.9) 04/06/2014  . Shortness of breath 07/14/2013  . Bone cyst 07/14/2013  . Polyneuropathy in other diseases classified elsewhere 02/17/2013  . Chronic back pain 12/09/2012  . Peripheral  edema 09/25/2012  . Anemia due to chronic illness 09/01/2012  . Cervicalgia 07/11/2012  . Disturbance of skin sensation 07/11/2012  . Temporal arteritis 04/01/2012  . Lower GI bleed 12/24/2011  . Rectal bleed 12/13/2011  . Chronic renal insufficiency, stage II (mild) 04/04/2011  . LUMBAR RADICULOPATHY, LEFT 04/18/2009  . Essential hypertension 12/30/2007  . SEIZURE DISORDER 12/30/2007  . CEREBROVASCULAR ACCIDENT, HX OF 12/30/2007  . Hyperlipidemia 10/08/2007  . GERD 10/08/2007  . DIVERTICULOSIS, COLON 10/08/2007  . BRONCHIECTASIS 06/26/2007  . PULMONARY FIBROSIS 05/17/2007  . Osteoporosis 05/17/2007  .  BREAST CANCER, HX OF 05/17/2007  . MASTECTOMY, LEFT, HX OF 05/17/2007    Delrae Sawyers, PT,DPT,NCS 01/19/2015 2:56 PM Phone [phone] FAX [phone]          [facility] [address] [place], [place], [address] Phone: [phone]   Fax:  [phone]

## 2015-01-23 ENCOUNTER — Other Ambulatory Visit: Payer: Self-pay | Admitting: Family Medicine

## 2015-01-26 ENCOUNTER — Ambulatory Visit: Payer: Medicare Other | Attending: Adult Health

## 2015-01-26 DIAGNOSIS — R279 Unspecified lack of coordination: Secondary | ICD-10-CM | POA: Insufficient documentation

## 2015-01-26 DIAGNOSIS — R262 Difficulty in walking, not elsewhere classified: Secondary | ICD-10-CM | POA: Diagnosis not present

## 2015-01-26 DIAGNOSIS — R293 Abnormal posture: Secondary | ICD-10-CM

## 2015-01-26 DIAGNOSIS — M6281 Muscle weakness (generalized): Secondary | ICD-10-CM | POA: Diagnosis not present

## 2015-01-26 NOTE — Patient Instructions (Addendum)
HIP: Hamstrings - Short Sitting   Scoot to edge of chair. Put one leg out in front of you and pull the toe up. Keep knee straight. Lift chest and lean forward leading with your chest. Hold 30 seconds. Perform 3x on each leg as needed when you have pain or pulling sensation behind the knee. Copyright  VHI. All rights reserved.    One leg stand Stand next to counter, in front of a mirror and hold the counter as needed. Lift the left leg up and watch your pelvis so it doesn't drop. Try to keep it level. Hold as long as you can, then switch legs. Perform 10x daily

## 2015-01-26 NOTE — Therapy (Signed)
[facility] [address] [place], [place], [address] Phone: [phone]   Fax:  [phone]  Physical Therapy Treatment  Patient Details  Name: [name] MRN: [id number] Date of Birth: 1940-10-23 Referring Provider:  Eulas Post, MD  Encounter Date: 01/26/2015      PT End of Session - 01/26/15 1502    Visit Number 4   Number of Visits 17   Date for PT Re-Evaluation 03/11/15   Authorization Type Medicare Primary-G code required; Tricare secondary-no PTA   PT Start Time 1232   PT Stop Time 1316   PT Time Calculation (min) 44 min      Past Medical History  Diagnosis Date  . History of breast cancer   . Osteoporosis   . Idiopathic pulmonary fibrosis   . Bronchiectasis   . Vitamin D deficiency   . Hyperlipidemia   . Allergic rhinitis   . GERD (gastroesophageal reflux disease)   . Left knee DJD   . Anxiety   . Migraine   . Diverticulosis of colon   . Seizure disorder   . Temporal arteritis     Right eye blind, on steroids per [facility]/ Dr [name]  . CVA (cerebral infarction) 10/2007    Right thalamic   . PMR (polymyalgia rheumatica)   . Anemia   . Gait disorder   . Peripheral neuropathy   . HTN (hypertension)   . Type II or unspecified type diabetes mellitus without mention of complication, not stated as uncontrolled     2nd to steriods  . Renal insufficiency   . Previous back surgery 10/09/12  . Polyneuropathy in other diseases classified elsewhere 02/17/2013    Past Surgical History  Procedure Laterality Date  . Mastectomy    . Cholecystectomy    . Total knee arthroplasty      Left  . Tonsillectomy    . Cataract extraction      OS - Summer 2010  . Lumbar fusion  04/2010    W/Mechanical fixation - [facility]  . Spinal fusion  07/2010    T10-L2 interbody fusion / [facility]  . Colonoscopy  12/15/2011    Procedure: COLONOSCOPY;  Surgeon: [name], MD;  Location: [facility]  ENDOSCOPY;  Service: Endoscopy;  Laterality: N/A;  . Carpal tunnel release Right     There were no vitals filed for this visit.  Visit Diagnosis:  Posture abnormality  Muscle weakness (generalized)  Difficulty walking      Subjective Assessment - 01/26/15 1235    Subjective Pt reports lifiting the walker in and out of her car causes some muscle soreness.   Currently in Pain? No/denies     Therex: Seated thoracic rotation stretch each side 3x30 seconds to decrease trunk soreness 3x30 seconds seated hamstring stretch each leg. 3x10 bridging  3x30 seconds left glut max stretch 2x10 pelvic tilts 3x10 standing hamstring curls with BUE support on rollator with 2# ankle weights   Gait training with single point cane [facility]): x 2 minute 33 seconds with supervision, left lateral lean, decreased stance time on RLE. During pt's rest break, therapist explained gait deviations and possible reasoning based on various weak muscles. Also practiced standing in front of a mirror and lifting the LLE with verbal instruction to try to keep pelvis level to assist with increased balance on RLE single limb stance in gait pattern.  PT Short Term Goals - 01/10/15 1525    PT SHORT TERM GOAL #1   Title Pt will demonstrate correct performance of HEP to address balance impairment and postural impairment. Target 02/11/15   PT SHORT TERM GOAL #2   Title Pt will increase score on Berg Balance Test to 43/56 for improving balance. Target 02/11/15   PT SHORT TERM GOAL #3   Title Pt will perform TUG safely with single point cane for increased efficiency of household ambulation and progress toward using the cane in her home. Target 02/11/15   PT SHORT TERM GOAL #4   Title Pt will ambulate 200' with single point cane on indoor level surface MOD I for progress to household ambulation with cane. Target 02/11/15           PT Long Term Goals - 01/11/15 1747    PT LONG  TERM GOAL #1   Title Pt will verbalize understanding of fall prevention strategies in home environment. Target 03/11/15   PT LONG TERM GOAL #2   Title Pt will verbalize understanding of CVA warning signs and risk factors due to her reports of having hx of "mini-strokes." Target 03/11/15   PT LONG TERM GOAL #3   Title Pt will increase Berg Balance Test score to 48/56 for decreased fall risk. Target 03/11/15   PT LONG TERM GOAL #4   Title Pt will ambulate 200' on outdoor concrete surfaces with a cane and supervision for increased independence when out in the community with friends or family. Target 03/11/15   PT LONG TERM GOAL #5   Title Pt will safely perform TUG with cane in <13.5 seconds for decreased fall risk. Target 03/11/15               Plan - 01/26/15 1503    Clinical Impression Statement Pt demonstrated improved posture today. She is definitely making progress in this area. Gait pattern with cane is still very impaired with increased left lateral lean and notable L pelvic drop. Will continue to benefit form skilled PT services for muscle strengthening and gait training as well as continued posture awareness.   PT Next Visit Plan glut med strength, balance training, gait training with mirror to decrease pelvic drop if able        Problem List Patient Active Problem List   Diagnosis Date Noted  . Obesity (BMI 30-39.9) 04/06/2014  . Shortness of breath 07/14/2013  . Bone cyst 07/14/2013  . Polyneuropathy in other diseases classified elsewhere 02/17/2013  . Chronic back pain 12/09/2012  . Peripheral edema 09/25/2012  . Anemia due to chronic illness 09/01/2012  . Cervicalgia 07/11/2012  . Disturbance of skin sensation 07/11/2012  . Temporal arteritis 04/01/2012  . Lower GI bleed 12/24/2011  . Rectal bleed 12/13/2011  . Chronic renal insufficiency, stage II (mild) 04/04/2011  . LUMBAR RADICULOPATHY, LEFT 04/18/2009  . Essential hypertension 12/30/2007  . SEIZURE DISORDER  12/30/2007  . CEREBROVASCULAR ACCIDENT, HX OF 12/30/2007  . Hyperlipidemia 10/08/2007  . GERD 10/08/2007  . DIVERTICULOSIS, COLON 10/08/2007  . BRONCHIECTASIS 06/26/2007  . PULMONARY FIBROSIS 05/17/2007  . Osteoporosis 05/17/2007  . BREAST CANCER, HX OF 05/17/2007  . MASTECTOMY, LEFT, HX OF 05/17/2007    Delrae Sawyers, PT,DPT,NCS 01/26/2015 3:08 PM Phone [phone] FAX [phone]         [facility] [address] [place], [place], [address] Phone: [phone]   Fax:  [phone]

## 2015-01-28 ENCOUNTER — Ambulatory Visit: Payer: Medicare Other

## 2015-01-28 DIAGNOSIS — M6281 Muscle weakness (generalized): Secondary | ICD-10-CM | POA: Diagnosis not present

## 2015-01-28 DIAGNOSIS — R279 Unspecified lack of coordination: Secondary | ICD-10-CM | POA: Diagnosis not present

## 2015-01-28 DIAGNOSIS — R293 Abnormal posture: Secondary | ICD-10-CM | POA: Diagnosis not present

## 2015-01-28 DIAGNOSIS — R262 Difficulty in walking, not elsewhere classified: Secondary | ICD-10-CM | POA: Diagnosis not present

## 2015-01-28 NOTE — Therapy (Signed)
[facility] [address] [place], [place], [address] Phone: [phone]   Fax:  [phone]  Physical Therapy Treatment  Patient Details  Name: [name] MRN: [id number] Date of Birth: 1941-05-29 Referring Provider:  Eulas Post, MD  Encounter Date: 01/28/2015      PT End of Session - 01/28/15 1725    Visit Number 5   Number of Visits 17   Date for PT Re-Evaluation 03/11/15   Authorization Type Medicare Primary-G code required; Tricare secondary-no PTA   PT Start Time 1319   PT Stop Time 1400   PT Time Calculation (min) 41 min      Past Medical History  Diagnosis Date  . History of breast cancer   . Osteoporosis   . Idiopathic pulmonary fibrosis   . Bronchiectasis   . Vitamin D deficiency   . Hyperlipidemia   . Allergic rhinitis   . GERD (gastroesophageal reflux disease)   . Left knee DJD   . Anxiety   . Migraine   . Diverticulosis of colon   . Seizure disorder   . Temporal arteritis     Right eye blind, on steroids per [facility]/ Dr [name]  . CVA (cerebral infarction) 10/2007    Right thalamic   . PMR (polymyalgia rheumatica)   . Anemia   . Gait disorder   . Peripheral neuropathy   . HTN (hypertension)   . Type II or unspecified type diabetes mellitus without mention of complication, not stated as uncontrolled     2nd to steriods  . Renal insufficiency   . Previous back surgery 10/09/12  . Polyneuropathy in other diseases classified elsewhere 02/17/2013    Past Surgical History  Procedure Laterality Date  . Mastectomy    . Cholecystectomy    . Total knee arthroplasty      Left  . Tonsillectomy    . Cataract extraction      OS - Summer 2010  . Lumbar fusion  04/2010    W/Mechanical fixation - [facility]  . Spinal fusion  07/2010    T10-L2 interbody fusion / [facility]  . Colonoscopy  12/15/2011    Procedure: COLONOSCOPY;  Surgeon: [name], MD;  Location: [facility]  ENDOSCOPY;  Service: Endoscopy;  Laterality: N/A;  . Carpal tunnel release Right     There were no vitals filed for this visit.  Visit Diagnosis:  Posture abnormality  Muscle weakness (generalized)  Lack of coordination      Subjective Assessment - 01/28/15 1323    Subjective Pt reports feeling extra achy today. Was unable to have her infusion for her rheumatoid arthritis.    Currently in Pain? Yes   Pain Score 5    Pain Location --  generalized all over body with torso shoulders and neck the worst   Pain Descriptors / Indicators Aching   Pain Type Chronic pain   Pain Onset More than a month ago   Pain Frequency Intermittent   Aggravating Factors  not being able to take her injectoin for the arthritis      AROM warmup to decrease stiffness for improved mobility in therapy session: Gentle alternating hamstring stretch x10 each side Pelvic tilts x10 Gentle alternating thoracic twist x10 each side Gentle alternating thoracic side bend x10 each side gentle spinal flexion/ extension in sitting x5  Left foot taps on 8" step stool with BUE support with mirror and tactile cues to try to keep pelvis level -then with  left foot on box: 3x10 hip hikes -then single limb stance with ball roll--intermittent UE support CGA mirror and tactile cues perform on each limb 2 bouts to form fatigue  Frequent rest breaks required                             PT Short Term Goals - 01/10/15 1525    PT SHORT TERM GOAL #1   Title Pt will demonstrate correct performance of HEP to address balance impairment and postural impairment. Target 02/11/15   PT SHORT TERM GOAL #2   Title Pt will increase score on Berg Balance Test to 43/56 for improving balance. Target 02/11/15   PT SHORT TERM GOAL #3   Title Pt will perform TUG safely with single point cane for increased efficiency of household ambulation and progress toward using the cane in her home. Target 02/11/15   PT SHORT TERM  GOAL #4   Title Pt will ambulate 200' with single point cane on indoor level surface MOD I for progress to household ambulation with cane. Target 02/11/15           PT Long Term Goals - 01/11/15 1747    PT LONG TERM GOAL #1   Title Pt will verbalize understanding of fall prevention strategies in home environment. Target 03/11/15   PT LONG TERM GOAL #2   Title Pt will verbalize understanding of CVA warning signs and risk factors due to her reports of having hx of "mini-strokes." Target 03/11/15   PT LONG TERM GOAL #3   Title Pt will increase Berg Balance Test score to 48/56 for decreased fall risk. Target 03/11/15   PT LONG TERM GOAL #4   Title Pt will ambulate 200' on outdoor concrete surfaces with a cane and supervision for increased independence when out in the community with friends or family. Target 03/11/15   PT LONG TERM GOAL #5   Title Pt will safely perform TUG with cane in <13.5 seconds for decreased fall risk. Target 03/11/15               Plan - 01/28/15 1725    Clinical Impression Statement Demonstrating progress toward goals   PT Next Visit Plan continue with single limb stance activities and balance training        Problem List Patient Active Problem List   Diagnosis Date Noted  . Obesity (BMI 30-39.9) 04/06/2014  . Shortness of breath 07/14/2013  . Bone cyst 07/14/2013  . Polyneuropathy in other diseases classified elsewhere 02/17/2013  . Chronic back pain 12/09/2012  . Peripheral edema 09/25/2012  . Anemia due to chronic illness 09/01/2012  . Cervicalgia 07/11/2012  . Disturbance of skin sensation 07/11/2012  . Temporal arteritis 04/01/2012  . Lower GI bleed 12/24/2011  . Rectal bleed 12/13/2011  . Chronic renal insufficiency, stage II (mild) 04/04/2011  . LUMBAR RADICULOPATHY, LEFT 04/18/2009  . Essential hypertension 12/30/2007  . SEIZURE DISORDER 12/30/2007  . CEREBROVASCULAR ACCIDENT, HX OF 12/30/2007  . Hyperlipidemia 10/08/2007  . GERD  10/08/2007  . DIVERTICULOSIS, COLON 10/08/2007  . BRONCHIECTASIS 06/26/2007  . PULMONARY FIBROSIS 05/17/2007  . Osteoporosis 05/17/2007  . BREAST CANCER, HX OF 05/17/2007  . MASTECTOMY, LEFT, HX OF 05/17/2007   Delrae Sawyers, PT,DPT,NCS 01/28/2015 5:28 PM Phone [phone] FAX [phone]         [facility] [address] [place], [place], [address] Phone: [phone]   Fax:  [phone]

## 2015-02-07 ENCOUNTER — Ambulatory Visit: Payer: Medicare Other

## 2015-02-09 ENCOUNTER — Ambulatory Visit: Payer: Medicare Other

## 2015-02-09 DIAGNOSIS — M6281 Muscle weakness (generalized): Secondary | ICD-10-CM

## 2015-02-09 DIAGNOSIS — R279 Unspecified lack of coordination: Secondary | ICD-10-CM

## 2015-02-09 DIAGNOSIS — R293 Abnormal posture: Secondary | ICD-10-CM | POA: Diagnosis not present

## 2015-02-09 DIAGNOSIS — N183 Chronic kidney disease, stage 3 (moderate): Secondary | ICD-10-CM | POA: Diagnosis not present

## 2015-02-09 DIAGNOSIS — R262 Difficulty in walking, not elsewhere classified: Secondary | ICD-10-CM | POA: Diagnosis not present

## 2015-02-09 DIAGNOSIS — E611 Iron deficiency: Secondary | ICD-10-CM | POA: Diagnosis not present

## 2015-02-09 NOTE — Therapy (Signed)
[facility] [address] [place], [place], [address] Phone: [phone]   Fax:  [phone]  Physical Therapy Treatment  Patient Details  Name: [name] MRN: [id number] Date of Birth: [birth year] Referring Provider:  Eulas Post, MD  Encounter Date: 02/09/2015      PT End of Session - 02/09/15 1217    Visit Number 6   Number of Visits 17   Date for PT Re-Evaluation 03/11/15   Authorization Type Medicare Primary-G code required; Tricare secondary-no PTA   PT Start Time 0933   PT Stop Time 1015   PT Time Calculation (min) 42 min      Past Medical History  Diagnosis Date  . History of breast cancer   . Osteoporosis   . Idiopathic pulmonary fibrosis   . Bronchiectasis   . Vitamin D deficiency   . Hyperlipidemia   . Allergic rhinitis   . GERD (gastroesophageal reflux disease)   . Left knee DJD   . Anxiety   . Migraine   . Diverticulosis of colon   . Seizure disorder   . Temporal arteritis     Right eye blind, on steroids per [facility]/ Dr [name]  . CVA (cerebral infarction) 10/2007    Right thalamic   . PMR (polymyalgia rheumatica)   . Anemia   . Gait disorder   . Peripheral neuropathy   . HTN (hypertension)   . Type II or unspecified type diabetes mellitus without mention of complication, not stated as uncontrolled     2nd to steriods  . Renal insufficiency   . Previous back surgery 10/09/12  . Polyneuropathy in other diseases classified elsewhere 02/17/2013    Past Surgical History  Procedure Laterality Date  . Mastectomy    . Cholecystectomy    . Total knee arthroplasty      Left  . Tonsillectomy    . Cataract extraction      OS - Summer 2010  . Lumbar fusion  04/2010    W/Mechanical fixation - [facility]  . Spinal fusion  07/2010    T10-L2 interbody fusion / [facility]  . Colonoscopy  12/15/2011    Procedure: COLONOSCOPY;  Surgeon: [name], MD;  Location: [facility]  ENDOSCOPY;  Service: Endoscopy;  Laterality: N/A;  . Carpal tunnel release Right     There were no vitals filed for this visit.  Visit Diagnosis:  Posture abnormality  Muscle weakness (generalized)  Lack of coordination  Difficulty walking      Subjective Assessment - 02/09/15 0937    Subjective Pt had to miss apointment on Monday due to therapist being out. No falls or new pain to report   Currently in Pain? No/denies            [facility] PT Assessment - 02/09/15 0001    Berg Balance Test   Sit to Stand Able to stand without using hands and stabilize independently   Standing Unsupported Able to stand safely 2 minutes   Sitting with Back Unsupported but Feet Supported on Floor or Stool Able to sit safely and securely 2 minutes   Stand to Sit Sits safely with minimal use of hands   Transfers Able to transfer safely, minor use of hands   Standing Unsupported with Eyes Closed Able to stand 10 seconds safely   Standing Ubsupported with Feet Together Able to place feet together independently and stand 1 minute safely   From Standing, Reach Forward with Outstretched Arm Can  reach forward >12 cm safely (5")   From Standing Position, Pick up Object from [place] to pick up shoe, needs supervision   From Standing Position, Turn to Look Behind Over each Shoulder Looks behind from both sides and weight shifts well   Turn 360 Degrees Able to turn 360 degrees safely one side only in 4 seconds or less   Standing Unsupported, Alternately Place Feet on Step/Stool Able to stand independently and safely and complete 8 steps in 20 seconds   Standing Unsupported, One Foot in [place] to plae foot ahead of the other independently and hold 30 seconds   Standing on One Leg Tries to lift leg/unable to hold 3 seconds but remains standing independently   Total Score 49     Gait training x350' with single point cane [facility]) and supervision with lateral lean and notable trendeleburg gait pattern; then  in front of mirror, multiple laps with single point cane with emphasis on correcting posture with gait pattern. Single limb stance with RLE on solid surface without UE support with CGA with LLE rolling ball; progressed to RLE on airex and single UE support and LLE unsupported with tactile cues to keep pelvis level and posture upright. Performed to decrease pelvic drop in gait pattern.   Gait training with [place] on indoor level surface again x75' with pt focusing on posture, with improved stability with supervision and slight decrease in pelvic drop/trendelenburg.  Stair training with BUE support on rails, with step-to pattern and MOD I, emphasis on maintaining erect posture and not "giving in" to pelvic drop while descending stairs. Performed 5x4 stairs.   Neuro re-ed: berg balance test-see above                       PT Short Term Goals - 01/10/15 1525    PT SHORT TERM GOAL #1   Title Pt will demonstrate correct performance of HEP to address balance impairment and postural impairment. Target 02/11/15   PT SHORT TERM GOAL #2   Title Pt will increase score on Berg Balance Test to 43/56 for improving balance. Target 02/11/15   PT SHORT TERM GOAL #3   Title Pt will perform TUG safely with single point cane for increased efficiency of household ambulation and progress toward using the cane in her home. Target 02/11/15   PT SHORT TERM GOAL #4   Title Pt will ambulate 200' with single point cane on indoor level surface MOD I for progress to household ambulation with cane. Target 02/11/15           PT Long Term Goals - 01/11/15 1747    PT LONG TERM GOAL #1   Title Pt will verbalize understanding of fall prevention strategies in home environment. Target 03/11/15   PT LONG TERM GOAL #2   Title Pt will verbalize understanding of CVA warning signs and risk factors due to her reports of having hx of "mini-strokes." Target 03/11/15   PT LONG TERM GOAL #3   Title Pt will increase Berg  Balance Test score to 48/56 for decreased fall risk. Target 03/11/15   PT LONG TERM GOAL #4   Title Pt will ambulate 200' on outdoor concrete surfaces with a cane and supervision for increased independence when out in the community with friends or family. Target 03/11/15   PT LONG TERM GOAL #5   Title Pt will safely perform TUG with cane in <13.5 seconds for decreased fall risk. Target 03/11/15  Plan - 02/09/15 1218    Clinical Impression Statement Met Berg Balance test goal. Will benefit from continued PT services to continue to work on posture, lower extremtiy strength, endurance and gait with single point cane.    PT Next Visit Plan Check remaining STGs, posture with single limb stance activities, hip abductor strength        Problem List Patient Active Problem List   Diagnosis Date Noted  . Obesity (BMI 30-39.9) 04/06/2014  . Shortness of breath 07/14/2013  . Bone cyst 07/14/2013  . Polyneuropathy in other diseases classified elsewhere 02/17/2013  . Chronic back pain 12/09/2012  . Peripheral edema 09/25/2012  . Anemia due to chronic illness 09/01/2012  . Cervicalgia 07/11/2012  . Disturbance of skin sensation 07/11/2012  . Temporal arteritis 04/01/2012  . Lower GI bleed 12/24/2011  . Rectal bleed 12/13/2011  . Chronic renal insufficiency, stage II (mild) 04/04/2011  . LUMBAR RADICULOPATHY, LEFT 04/18/2009  . Essential hypertension 12/30/2007  . SEIZURE DISORDER 12/30/2007  . CEREBROVASCULAR ACCIDENT, HX OF 12/30/2007  . Hyperlipidemia 10/08/2007  . GERD 10/08/2007  . DIVERTICULOSIS, COLON 10/08/2007  . BRONCHIECTASIS 06/26/2007  . PULMONARY FIBROSIS 05/17/2007  . Osteoporosis 05/17/2007  . BREAST CANCER, HX OF 05/17/2007  . MASTECTOMY, LEFT, HX OF 05/17/2007   Delrae Sawyers, PT,DPT,NCS 02/09/2015 12:20 PM Phone [phone] FAX [phone]         [facility] [address]  [place], [place], [address] Phone: [phone]   Fax:  [phone]

## 2015-02-15 ENCOUNTER — Other Ambulatory Visit (INDEPENDENT_AMBULATORY_CARE_PROVIDER_SITE_OTHER): Payer: Self-pay

## 2015-02-15 ENCOUNTER — Ambulatory Visit: Payer: Medicare Other

## 2015-02-15 DIAGNOSIS — M316 Other giant cell arteritis: Secondary | ICD-10-CM | POA: Diagnosis not present

## 2015-02-15 DIAGNOSIS — R262 Difficulty in walking, not elsewhere classified: Secondary | ICD-10-CM

## 2015-02-15 DIAGNOSIS — Z0289 Encounter for other administrative examinations: Secondary | ICD-10-CM

## 2015-02-15 NOTE — Addendum Note (Signed)
Addended by: Margorie John on: 02/15/2015 11:27 AM   Modules accepted: Orders

## 2015-02-15 NOTE — Therapy (Signed)
[facility] [address] [place], [place], [address] Phone: [phone]   Fax:  [phone]  Physical Therapy Treatment  Patient Details  Name: XI PEARCEY MRN: [id number] Date of Birth: 07-Apr-1941 Referring Provider:  Eulas Post, MD  Encounter Date: 02/15/2015    NO CHARGE FOR VISIT.      PT End of Session - 02/15/15 1113    Visit Number 6  no charge   Number of Visits 17   Date for PT Re-Evaluation 03/11/15   Authorization Type Medicare Primary-G code required; Tricare secondary-no PTA   PT Start Time 1101   PT Stop Time 1107   PT Time Calculation (min) 6 min      Past Medical History  Diagnosis Date  . History of breast cancer   . Osteoporosis   . Idiopathic pulmonary fibrosis   . Bronchiectasis   . Vitamin D deficiency   . Hyperlipidemia   . Allergic rhinitis   . GERD (gastroesophageal reflux disease)   . Left knee DJD   . Anxiety   . Migraine   . Diverticulosis of colon   . Seizure disorder   . Temporal arteritis     Right eye blind, on steroids per [facility]/ Dr [name]  . CVA (cerebral infarction) 10/2007    Right thalamic   . PMR (polymyalgia rheumatica)   . Anemia   . Gait disorder   . Peripheral neuropathy   . HTN (hypertension)   . Type II or unspecified type diabetes mellitus without mention of complication, not stated as uncontrolled     2nd to steriods  . Renal insufficiency   . Previous back surgery 10/09/12  . Polyneuropathy in other diseases classified elsewhere 02/17/2013    Past Surgical History  Procedure Laterality Date  . Mastectomy    . Cholecystectomy    . Total knee arthroplasty      Left  . Tonsillectomy    . Cataract extraction      OS - Summer 2010  . Lumbar fusion  04/2010    W/Mechanical fixation - [facility]  . Spinal fusion  07/2010    T10-L2 interbody fusion / [facility]  . Colonoscopy  12/15/2011    Procedure: COLONOSCOPY;   Surgeon: [name], MD;  Location: [facility] ENDOS[facility];  Service: Endoscopy;  Laterality: N/A;  . Carpal tunnel release Right     There were no vitals filed for this visit.  Visit Diagnosis:  Difficulty walking      Subjective Assessment - 02/15/15 1110    Subjective Pt reported she is not feelng good today, as she has not had infusion due to anti-biotic treatment. Pt reported she has diffuse pain all over body, as  she has to wait 6 weeks to have infusion treatment. Pt requesting to cancel appt. today, pt did not call to cancel as she thought she needed to give 24 hour notice. However, pt noted to be on verge of tears due to pain.   Currently in Pain? Yes   Pain Score 7    Pain Location --  diffuse body pain   Pain Descriptors / Indicators Aching   Pain Type Chronic pain   Pain Onset More than a month ago   Pain Frequency Constant   Aggravating Factors  Not having infusion due to antibiotic treatment  PT Education - 02/15/15 1113    Education provided Yes   Education Details PT educated pt that she can call the day of appointment to cancel if she is ill.   Person(s) Educated Patient   Methods Explanation   Comprehension Verbalized understanding          PT Short Term Goals - 01/10/15 1525    PT SHORT TERM GOAL #1   Title Pt will demonstrate correct performance of HEP to address balance impairment and postural impairment. Target 02/11/15   PT SHORT TERM GOAL #2   Title Pt will increase score on Berg Balance Test to 43/56 for improving balance. Target 02/11/15   PT SHORT TERM GOAL #3   Title Pt will perform TUG safely with single point cane for increased efficiency of household ambulation and progress toward using the cane in her home. Target 02/11/15   PT SHORT TERM GOAL #4   Title Pt will ambulate 200' with single point cane on indoor level surface MOD I for progress to household ambulation with cane. Target 02/11/15            PT Long Term Goals - 01/11/15 1747    PT LONG TERM GOAL #1   Title Pt will verbalize understanding of fall prevention strategies in home environment. Target 03/11/15   PT LONG TERM GOAL #2   Title Pt will verbalize understanding of CVA warning signs and risk factors due to her reports of having hx of "mini-strokes." Target 03/11/15   PT LONG TERM GOAL #3   Title Pt will increase Berg Balance Test score to 48/56 for decreased fall risk. Target 03/11/15   PT LONG TERM GOAL #4   Title Pt will ambulate 200' on outdoor concrete surfaces with a cane and supervision for increased independence when out in the community with friends or family. Target 03/11/15   PT LONG TERM GOAL #5   Title Pt will safely perform TUG with cane in <13.5 seconds for decreased fall risk. Target 03/11/15               Plan - 02/15/15 1114    Clinical Impression Statement No charge for visit, as pt requested to cancel appt due to pain.    PT Next Visit Plan Check remaining STGs, posture with single limb stance activities, hip abductor strength   Consulted and Agree with Plan of Care Patient        Problem List Patient Active Problem List   Diagnosis Date Noted  . Obesity (BMI 30-39.9) 04/06/2014  . Shortness of breath 07/14/2013  . Bone cyst 07/14/2013  . Polyneuropathy in other diseases classified elsewhere 02/17/2013  . Chronic back pain 12/09/2012  . Peripheral edema 09/25/2012  . Anemia due to chronic illness 09/01/2012  . Cervicalgia 07/11/2012  . Disturbance of skin sensation 07/11/2012  . Temporal arteritis 04/01/2012  . Lower GI bleed 12/24/2011  . Rectal bleed 12/13/2011  . Chronic renal insufficiency, stage II (mild) 04/04/2011  . LUMBAR RADICULOPATHY, LEFT 04/18/2009  . Essential hypertension 12/30/2007  . SEIZURE DISORDER 12/30/2007  . CEREBROVASCULAR ACCIDENT, HX OF 12/30/2007  . Hyperlipidemia 10/08/2007  . GERD 10/08/2007  . DIVERTICULOSIS, COLON 10/08/2007  .  BRONCHIECTASIS 06/26/2007  . PULMONARY FIBROSIS 05/17/2007  . Osteoporosis 05/17/2007  . BREAST CANCER, HX OF 05/17/2007  . MASTECTOMY, LEFT, HX OF 05/17/2007    Miller,Jennifer L 02/15/2015, 11:15 AM  [facility] [address] [place], [place], [address] Phone: [phone]  Fax:  [phone]     Geoffry Paradise, PT,DPT 02/15/2015 11:15 AM Phone: [phone] Fax: [phone]

## 2015-02-16 LAB — SEDIMENTATION RATE: Sed Rate: 11 mm/hr (ref 0–40)

## 2015-02-17 ENCOUNTER — Ambulatory Visit: Payer: Medicare Other

## 2015-02-17 DIAGNOSIS — R279 Unspecified lack of coordination: Secondary | ICD-10-CM | POA: Diagnosis not present

## 2015-02-17 DIAGNOSIS — R293 Abnormal posture: Secondary | ICD-10-CM | POA: Diagnosis not present

## 2015-02-17 DIAGNOSIS — R262 Difficulty in walking, not elsewhere classified: Secondary | ICD-10-CM | POA: Diagnosis not present

## 2015-02-17 DIAGNOSIS — M6281 Muscle weakness (generalized): Secondary | ICD-10-CM | POA: Diagnosis not present

## 2015-02-17 NOTE — Therapy (Signed)
[facility] [address] [place], [place], [address] Phone: [phone]   Fax:  [phone]  Physical Therapy Treatment  Patient Details  Name: [name] MRN: [id number] Date of Birth: [birth year] Referring Provider:  Eulas Post, MD  Encounter Date: 02/17/2015      PT End of Session - 02/17/15 1117    Visit Number 7   Number of Visits 17   Date for PT Re-Evaluation 03/11/15   Authorization Type Medicare Primary-G code required; Tricare secondary-no PTA   PT Start Time 1020   PT Stop Time 1100   PT Time Calculation (min) 40 min      Past Medical History  Diagnosis Date  . History of breast cancer   . Osteoporosis   . Idiopathic pulmonary fibrosis   . Bronchiectasis   . Vitamin D deficiency   . Hyperlipidemia   . Allergic rhinitis   . GERD (gastroesophageal reflux disease)   . Left knee DJD   . Anxiety   . Migraine   . Diverticulosis of colon   . Seizure disorder   . Temporal arteritis     Right eye blind, on steroids per [facility]/ Dr [name]  . CVA (cerebral infarction) 10/2007    Right thalamic   . PMR (polymyalgia rheumatica)   . Anemia   . Gait disorder   . Peripheral neuropathy   . HTN (hypertension)   . Type II or unspecified type diabetes mellitus without mention of complication, not stated as uncontrolled     2nd to steriods  . Renal insufficiency   . Previous back surgery 10/09/12  . Polyneuropathy in other diseases classified elsewhere 02/17/2013    Past Surgical History  Procedure Laterality Date  . Mastectomy    . Cholecystectomy    . Total knee arthroplasty      Left  . Tonsillectomy    . Cataract extraction      OS - Summer 2010  . Lumbar fusion  04/2010    W/Mechanical fixation - [facility]  . Spinal fusion  07/2010    T10-L2 interbody fusion / [facility]  . Colonoscopy  12/15/2011    Procedure: COLONOSCOPY;  Surgeon: [name], MD;  Location: [facility]  ENDOSCOPY;  Service: Endoscopy;  Laterality: N/A;  . Carpal tunnel release Right     There were no vitals filed for this visit.  Visit Diagnosis:  Difficulty walking  Lack of coordination  Posture abnormality      Subjective Assessment - 02/17/15 1026    Subjective Pt reports she lost her oxycodone and has been orried that someone may have found it and may come looking for more. She has called places she's visited and traced her steps but hasn't found it   Currently in Pain? Yes   Pain Score 6    Pain Location --  diffuse body pain worse in back   Pain Descriptors / Indicators Aching   Pain Type Chronic pain   Pain Onset More than a month ago      Checked pt's STGs:  Gait training with cane and MOD I x initial 150' then supervision x50' due to increased instability and one episode of R foot drop when fatigued. Pt continues to have notable lateral lean and pelvic instability partially due to poor posture and partially due to muscle weakness.  Neuro re-ed: Checked TUG and BERG see flowsheet info.  Taught tandem stance and tandem walking for HEP see handout.  [facility] PT Assessment - 02/17/15 0001    Standardized Balance Assessment   Standardized Balance Assessment Timed Up and Go Test   Timed Up and Go Test   TUG Normal TUG   Normal TUG (seconds) 12.22  performed safely with single point cane                     [facility] Adult PT Treatment/Exercise - 02/17/15 0001    Balance   Balance Assessed Yes   Standardized Balance Assessment   Standardized Balance Assessment Berg Balance Test   Berg Balance Test   Sit to Stand Able to stand without using hands and stabilize independently   Standing Unsupported Able to stand safely 2 minutes   Sitting with Back Unsupported but Feet Supported on Floor or Stool Able to sit safely and securely 2 minutes   Stand to Sit Sits safely with minimal use of hands   Transfers Able to transfer safely, minor use of hands    Standing Unsupported with Eyes Closed Able to stand 10 seconds safely   Standing Ubsupported with Feet Together Able to place feet together independently and stand 1 minute safely   From Standing, Reach Forward with Outstretched Arm Can reach forward >12 cm safely (5")  8"   From Standing Position, Pick up Object from Floor Able to pick up shoe safely and easily   From Standing Position, Turn to Look Behind Over each Shoulder Looks behind one side only/other side shows less weight shift   Turn 360 Degrees Able to turn 360 degrees safely but slowly   Standing Unsupported, Alternately Place Feet on Step/Stool Able to complete 4 steps without aid or supervision   Standing Unsupported, One Foot in Front Able to plae foot ahead of the other independently and hold 30 seconds   Standing on One Leg Able to lift leg independently and hold > 10 seconds   Total Score 49                PT Education - 02/17/15 1117    Education provided Yes   Education Details added tandem walking and tandem stance with fingertip support to HEP   Person(s) Educated Patient   Methods Explanation;Demonstration;Handout   Comprehension Verbalized understanding;Returned demonstration          PT Short Term Goals - 02/17/15 1031    PT SHORT TERM GOAL #1   Title Pt will demonstrate correct performance of HEP to address balance impairment and postural impairment. Target 02/11/15   PT SHORT TERM GOAL #2   Title Pt will increase score on Berg Balance Test to 43/56 for improving balance. Target 02/11/15   Status Achieved  49/56 on 02/17/15   PT SHORT TERM GOAL #3   Title Pt will perform TUG safely with single point cane for increased efficiency of household ambulation and progress toward using the cane in her home. Target 02/11/15   Status Achieved  12.22 seconds safely with cane.    PT SHORT TERM GOAL #4   Title Pt will ambulate 200' with single point cane on indoor level surface MOD I for progress to household  ambulation with cane. Target 02/11/15   Status Not Met  Mod I x150' then supervision due to increased unsteadiness with fatigue; will continue to LTGs.           PT Long Term Goals - 02/17/15 1124    PT LONG TERM GOAL #1   Title Pt will verbalize understanding of fall  prevention strategies in home environment. Target 03/11/15   PT LONG TERM GOAL #2   Title Pt will verbalize understanding of CVA warning signs and risk factors due to her reports of having hx of "mini-strokes." Target 03/11/15   PT LONG TERM GOAL #3   Title Pt will increase Berg Balance Test score to 48/56 for decreased fall risk. Target 03/11/15   PT LONG TERM GOAL #4   Title Pt will ambulate 200' on outdoor concrete surfaces with a cane and supervision for increased independence when out in the community with friends or family. Target 03/11/15   PT LONG TERM GOAL #5   Title Pt will safely perform TUG with cane in <13.5 seconds for decreased fall risk. Target 03/11/15   Additional Long Term Goals   Additional Long Term Goals Yes   PT LONG TERM GOAL #6   Title Pt will ambulate 200' with single point cane on indoor level surface MOD I for progress to household ambulation with cane. Target 03/11/15 (continued from un-met STG)   Status On-going               Plan - 02/17/15 1118    Clinical Impression Statement Pt continues to report pain but feels better than prior session. She believes she will feel much better when she has her infusion which had to be postponed due to being on antibiotics. Pt met TUG and Berg goal (49/56 placing her at a decreased risk of falls), and made progress toward ambulation with cane goal.  As pt fatigues she becomes less steady with cane and is only able to ambulate 150' with cane and MOD I, then required supervision. Continue per plan of care to maximize functional progress.   PT Next Visit Plan Check HEP short term goal. Posture with single limb stance activities, hip abductor strength. *Note  that pt has 2 appointments scheduled outside of plan of care. Unsure if she will benefit from renewal or be ready for d/c at end of plan of care. To be determined as pt continues with PT*   Consulted and Agree with Plan of Care Patient        Problem List Patient Active Problem List   Diagnosis Date Noted  . Obesity (BMI 30-39.9) 04/06/2014  . Shortness of breath 07/14/2013  . Bone cyst 07/14/2013  . Polyneuropathy in other diseases classified elsewhere 02/17/2013  . Chronic back pain 12/09/2012  . Peripheral edema 09/25/2012  . Anemia due to chronic illness 09/01/2012  . Cervicalgia 07/11/2012  . Disturbance of skin sensation 07/11/2012  . Temporal arteritis 04/01/2012  . Lower GI bleed 12/24/2011  . Rectal bleed 12/13/2011  . Chronic renal insufficiency, stage II (mild) 04/04/2011  . LUMBAR RADICULOPATHY, LEFT 04/18/2009  . Essential hypertension 12/30/2007  . SEIZURE DISORDER 12/30/2007  . CEREBROVASCULAR ACCIDENT, HX OF 12/30/2007  . Hyperlipidemia 10/08/2007  . GERD 10/08/2007  . DIVERTICULOSIS, COLON 10/08/2007  . BRONCHIECTASIS 06/26/2007  . PULMONARY FIBROSIS 05/17/2007  . Osteoporosis 05/17/2007  . BREAST CANCER, HX OF 05/17/2007  . MASTECTOMY, LEFT, HX OF 05/17/2007   Delrae Sawyers, PT,DPT,NCS 02/17/2015 11:33 AM Phone [phone] FAX [phone]         [facility] [address] [place], [place], [address] Phone: [phone]   Fax:  [phone]

## 2015-02-17 NOTE — Patient Instructions (Signed)
Tandem Standing   Stand with fingertips of one hand on counter next to you, and the heel of one foot touching the toe of the other. Hold for at least 30 seconds, working up to 2 minutes. Perform 2x daily on each leg.  Copyright  VHI. All rights reserved.    Feet Heel-Toe "Tandem"   Hold onto counter with fingertips and walk a straight line bringing one foot directly in front of the other so the heel of one foot touches the toe of the other foot. When you get to the end of the counter, walk backwards in the same manner.  Perform 2 laps daily Copyright  VHI. All rights reserved.

## 2015-02-25 ENCOUNTER — Other Ambulatory Visit: Payer: Self-pay | Admitting: Adult Health

## 2015-02-27 NOTE — Telephone Encounter (Signed)
Previous note says: I will decrease her prednisone to 7 mg daily

## 2015-03-01 ENCOUNTER — Encounter: Payer: Self-pay | Admitting: Nurse Practitioner

## 2015-03-01 ENCOUNTER — Ambulatory Visit: Payer: Medicare Other | Attending: Adult Health

## 2015-03-01 ENCOUNTER — Ambulatory Visit (INDEPENDENT_AMBULATORY_CARE_PROVIDER_SITE_OTHER): Payer: Medicare Other | Admitting: Nurse Practitioner

## 2015-03-01 VITALS — BP 100/53 | HR 91 | Ht 61.25 in | Wt 173.5 lb

## 2015-03-01 DIAGNOSIS — G63 Polyneuropathy in diseases classified elsewhere: Secondary | ICD-10-CM | POA: Diagnosis not present

## 2015-03-01 DIAGNOSIS — R262 Difficulty in walking, not elsewhere classified: Secondary | ICD-10-CM

## 2015-03-01 DIAGNOSIS — Z5181 Encounter for therapeutic drug level monitoring: Secondary | ICD-10-CM

## 2015-03-01 DIAGNOSIS — R279 Unspecified lack of coordination: Secondary | ICD-10-CM | POA: Diagnosis not present

## 2015-03-01 DIAGNOSIS — M316 Other giant cell arteritis: Secondary | ICD-10-CM

## 2015-03-01 DIAGNOSIS — M6281 Muscle weakness (generalized): Secondary | ICD-10-CM | POA: Diagnosis not present

## 2015-03-01 DIAGNOSIS — R293 Abnormal posture: Secondary | ICD-10-CM | POA: Insufficient documentation

## 2015-03-01 NOTE — Patient Instructions (Addendum)
Fall Prevention and Home Safety Falls cause injuries and can affect all age groups. It is possible to use preventive measures to significantly decrease the likelihood of falls. There are many simple measures which can make your home safer and prevent falls. OUTDOORS  Repair cracks and edges of walkways and driveways.  Remove high doorway thresholds.  Trim shrubbery on the main path into your home.  Have good outside lighting.  Clear walkways of tools, rocks, debris, and clutter.  Check that handrails are not broken and are securely fastened. Both sides of steps should have handrails.  Have leaves, snow, and ice cleared regularly.  Use sand or salt on walkways during winter months.  In the garage, clean up grease or oil spills. BATHROOM  Install night lights.  Install grab bars by the toilet and in the tub and shower.  Use non-skid mats or decals in the tub or shower.  Place a plastic non-slip stool in the shower to sit on, if needed.  Keep floors dry and clean up all water on the floor immediately.  Remove soap buildup in the tub or shower on a regular basis.  Secure bath mats with non-slip, double-sided rug tape.  Remove throw rugs and tripping hazards from the floors. BEDROOMS  Install night lights.  Make sure a bedside light is easy to reach.  Do not use oversized bedding.  Keep a telephone by your bedside.  Have a firm chair with side arms to use for getting dressed.  Remove throw rugs and tripping hazards from the floor. KITCHEN  Keep handles on pots and pans turned toward the center of the stove. Use back burners when possible.  Clean up spills quickly and allow time for drying.  Avoid walking on wet floors.  Avoid hot utensils and knives.  Position shelves so they are not too high or low.  Place commonly used objects within easy reach.  If necessary, use a sturdy step stool with a grab bar when reaching.  Keep electrical cables out of the  way.  Do not use floor polish or wax that makes floors slippery. If you must use wax, use non-skid floor wax.  Remove throw rugs and tripping hazards from the floor. STAIRWAYS  Never leave objects on stairs.  Place handrails on both sides of stairways and use them. Fix any loose handrails. Make sure handrails on both sides of the stairways are as long as the stairs.  Check carpeting to make sure it is firmly attached along stairs. Make repairs to worn or loose carpet promptly.  Avoid placing throw rugs at the top or bottom of stairways, or properly secure the rug with carpet tape to prevent slippage. Get rid of throw rugs, if possible.  Have an electrician put in a light switch at the top and bottom of the stairs. OTHER FALL PREVENTION TIPS  Wear low-heel or rubber-soled shoes that are supportive and fit well. Wear closed toe shoes.  When using a stepladder, make sure it is fully opened and both spreaders are firmly locked. Do not climb a closed stepladder.  Add color or contrast paint or tape to grab bars and handrails in your home. Place contrasting color strips on first and last steps.  Learn and use mobility aids as needed. Install an electrical emergency response system.  Turn on lights to avoid dark areas. Replace light bulbs that burn out immediately. Get light switches that glow.  Arrange furniture to create clear pathways. Keep furniture in the same place.    Firmly attach carpet with non-skid or double-sided tape.  Eliminate uneven floor surfaces.  Select a carpet pattern that does not visually hide the edge of steps.  Be aware of all pets. OTHER HOME SAFETY TIPS  Set the water temperature for 120 F (48.8 C).  Keep emergency numbers on or near the telephone.  Keep smoke detectors on every level of the home and near sleeping areas. Document Released: 08/31/2002 Document Revised: 03/11/2012 Document Reviewed: 11/30/2011 [facility] Patient Information 2015  [place], [place]. This information is not intended to replace advice given to you by your health care provider. Make sure you discuss any questions you have with your health care provider.       Sitting to standing: With feet apart, practice sitting and standing from a chair, 10x in front of mirror. Pay special attention to keep your posture upright, and keep your weight even between your feet. Don't wear lift shoes for this one.  Bow -Stand at [employer]. Hips square with counter, and feet apart. With a chair behind you. Lean/bend forward, keeping head centered (in line with belly button) then straighten up tall again, using the muscles in your back to do the movement. Perform 10x daily.  ANKLE: Pump - Sitting   With involved leg straight and off floor, bend toes toward floor, then toward ceiling. Perform while watching TV. Copyright  VHI. All rights reserved.    Weight Shift: Anterior / Posterior (Limits of Stability)   Stand next to the counter, with a chair behind you. hold counter. Slowly shift weight backward until toes begin to rise off floor. Return to starting position. Shift weight slowly forward until heels begin to rise off floor. Hold each position 1 seconds. Repeat 20 times per session. Do 1-2 sessions per day. Copyright  VHI. All rights reserved.    Feet Together, Head Motion - Eyes Closed   Stand in a corner with your rollator in front of you with one finger on the rollator. With eyes closed and feet together stand for 30 seconds, static. Then, move head slowly, up and down 20x, then side to side 20x. Perform 2 sessions daily. Copyright  VHI. All rights reserved.    AMBULATION: Side Step   Hold counter and step sideways. When you get to the end of the counter, repeat in opposite direction. Perform 3 laps at counter daily.  Copyright  VHI. All rights reserved.   HIP: Hamstrings - Short Sitting   Scoot to edge of chair. Put one leg out in front of you and  pull the toe up. Keep knee straight. Lift chest and lean forward leading with your chest. Hold 30 seconds. Perform 3x on each leg as needed when you have pain or pulling sensation behind the knee. Copyright  VHI. All rights reserved.    One leg stand Stand next to counter, in front of a mirror and hold the counter as needed. Lift the left leg up and watch your pelvis so it doesn't drop. Try to keep it level. Hold as long as you can, then switch legs. Perform 10x daily   Tandem Standing   Stand with fingertips of one hand on counter next to you, and the heel of one foot touching the toe of the other. Hold for at least 30 seconds, working up to 2 minutes. Perform 2x daily on each leg.  Copyright  VHI. All rights reserved.    Feet Heel-Toe "Tandem"   Hold onto counter with fingertips and walk a straight line  bringing one foot directly in front of the other so the heel of one foot touches the toe of the other foot. When you get to the end of the counter, walk backwards in the same manner.  Perform 2 laps daily

## 2015-03-01 NOTE — Patient Instructions (Addendum)
Will check sedimentation rate today Continue prednisone 6 mg daily does not need refills Follow-up in 6 months Next appointment with Dr. [name] then return to [facility] nurse practitioner

## 2015-03-01 NOTE — Progress Notes (Signed)
[facility]  PATIENT: [name] DOB: [birth year]   REASON FOR VISIT: Follow-up for temporal arteritis, polyneuropathy, left foot drop HISTORY FROM: Patient    HISTORY OF PRESENT ILLNESS:[name] is a 74 year old female with a history of temporal arteritis. She returns today for follow-up after her last visit 11/17/2014 with [facility] nurse practitioner.. Her last sedimentation rate was 11 on 02/15/15 and - her prednisone was reduced to 6 mg daily.  Patient also has a history of peripheral neuropathy and chronic back pain. She takes gabapentin for the neuropathy discomfort and that seems to be working well. Patient does feel that her peripheral neuropathy has advanced. She states that the numbness now extends up to the knee. She also has a left foot drop and is currently taking physical therapy 2 times a week which she thinks has been beneficial. An AFO has also been suggested but  she is not wild about that idea.  The patient will use oxycotin for her back pain and to help her sleep. She returns for reevaluation and repeat sedimentation rate  HISTORY 06/29/14:(MM) [name] is a 74 year old female with a history of temporal arteritis. She returns today for follow-up. She is currently taking prednisone 12.5mg . She denies have any headaches like the temporal arteritis. She does get regular headaches occasionally. She is blind in the right eye and occasionally will have some blurry vision in the left. She contributes this to her eye getting tired. She forgot that I had decreased the dose to 10. Her last sedimentation rate was 10. Patient also has peripheral neuropathy and takes gabapentin for the discomfort. Patient has a left sided lumbosacral radiculopathy that has caused a left footdrop. She states that the gabapentin is helpful but higher doses make her sleepy. She continues to have some discomfort in the feet and legs. She continues to take OxyContin for back pain  and uses it at night to help her fall asleep.   HISTORY 03/22/14: 74 year old female with a history of temporal arteritis. She returns today for follow-up. The patent is currently on 15 mg prednisone. She states that she has not had any headaches but does have some temporal tenderness at times but not recently. Patient also has peripheral neuropathy and takes gabapentin for the discomfort. Patient has a left sided lumbosacral radiculopathy that has caused a left footdrop. The patient uses a walker when ambulating. She states that the discomfort in her legs has worsened. She has numbness and tingling that is much worse at night. She is not sure if the gabapentin is helping but states that it might be much worse without it. She has had three back surgeries in the past. She followed up with her neurosurgeon in January and she states that the doctor told her that the "last 3 levels had shifted about 47mm." Patient also reports that she feels drowsy throughout the day. She takes 2 tablets of the gabapentin in the morning and before bed as well as schedule OxyContin twice a day. She returns today for an evaluation.    REVIEW OF SYSTEMS: Full 14 system review of systems performed and notable only for those listed, all others are neg:  Constitutional: Fatigue Cardiovascular: neg Ear/Nose/Throat: neg  Skin: neg Eyes: neg Respiratory: neg Gastroitestinal: neg  Hematology/Lymphatic: neg  Endocrine: neg Musculoskeletal: Back pain, walking difficulty Allergy/Immunology: neg Neurological: neg Psychiatric: neg Sleep : neg   ALLERGIES: Allergies  Allergen Reactions  . Hydromorphone Other (See Comments)  Cognitive changes     HOME MEDICATIONS: Outpatient Prescriptions Prior to Visit  Medication Sig Dispense Refill  . amLODipine (NORVASC) 5 MG tablet TAKE 1 TABLET DAILY 90 tablet 2  . calcium carbonate (OS-CAL) 600 MG TABS Take 600 mg by mouth daily.     . cholecalciferol (VITAMIN D) 1000 UNITS  tablet Take 1,000 Units by mouth daily.      Marland Kitchen denosumab (PROLIA) 60 MG/ML SOLN Inject 60 mg into the skin every 6 (six) months.      . ferrous sulfate 325 (65 FE) MG tablet Take 325 mg by mouth 2 (two) times daily.     . furosemide (LASIX) 40 MG tablet TAKE 1 TABLET DAILY 90 tablet 2  . gabapentin (NEURONTIN) 300 MG capsule TAKE 1 CAPSULE IN THE MORNNG, 1 CAPSULE AT NOON, 1 WITH DINNER AND 2 BEFORE BEDTIME (Patient taking differently: Taking 2 caps in am and 2 caps in pm.) 450 capsule 1  . Golimumab ([place] ARIA IV) Inject into the vein. Takes infusion every 8 wks.    . Multiple Vitamins-Minerals (MULTIVITAMIN,TX-MINERALS) tablet Take 1 tablet by mouth daily.      . OxyCODONE (OXYCONTIN) 10 mg T12A 12 hr tablet Take 1 tablet (10 mg total) by mouth every 12 (twelve) hours. May refill on or after March 08, 2015 60 tablet 0  . Oxycodone HCl 10 MG TABS Take 1 tablet (10 mg total) by mouth every 4 (four) hours as needed (breakthrough pain.). Breakthrough pain. 180 tablet 0  . potassium chloride (K-DUR,KLOR-CON) 10 MEQ tablet Take 10 mEq by mouth daily.    . predniSONE (DELTASONE) 1 MG tablet Take 2 tablets (2 mg total) by mouth daily with breakfast. With 5mg  tabs 180 tablet 0  . predniSONE (DELTASONE) 5 MG tablet TAKE 1 TABLET DAILY WITH BREAKFAST (ALONG WITH 1 MG TABLETS) 90 tablet 0  . simvastatin (ZOCOR) 5 MG tablet TAKE 1 TABLET AT BEDTIME 90 tablet 2  . OxyCODONE (OXYCONTIN) 10 mg T12A 12 hr tablet Take 1 tablet (10 mg total) by mouth every 12 (twelve) hours. 60 tablet 0  . OxyCODONE (OXYCONTIN) 10 mg T12A 12 hr tablet Take 1 tablet (10 mg total) by mouth every 12 (twelve) hours. May refill on or after Feb 05, 2015 60 tablet 0   No facility-administered medications prior to visit.    PAST MEDICAL HISTORY: Past Medical History  Diagnosis Date  . History of breast cancer   . Osteoporosis   . Idiopathic pulmonary fibrosis   . Bronchiectasis   . Vitamin D deficiency   . Hyperlipidemia   .  Allergic rhinitis   . GERD (gastroesophageal reflux disease)   . Left knee DJD   . Anxiety   . Migraine   . Diverticulosis of colon   . Seizure disorder   . Temporal arteritis     Right eye blind, on steroids per [facility]/ Dr [name]  . CVA (cerebral infarction) 10/2007    Right thalamic   . PMR (polymyalgia rheumatica)   . Anemia   . Gait disorder   . Peripheral neuropathy   . HTN (hypertension)   . Type II or unspecified type diabetes mellitus without mention of complication, not stated as uncontrolled     2nd to steriods  . Renal insufficiency   . Previous back surgery 10/09/12  . Polyneuropathy in other diseases classified elsewhere 02/17/2013    PAST SURGICAL HISTORY: Past Surgical History  Procedure Laterality Date  . Mastectomy    . Cholecystectomy    .  Total knee arthroplasty      Left  . Tonsillectomy    . Cataract extraction      OS - Summer 2010  . Lumbar fusion  04/2010    W/Mechanical fixation - [facility]  . Spinal fusion  07/2010    T10-L2 interbody fusion / [facility]  . Colonoscopy  12/15/2011    Procedure: COLONOSCOPY;  Surgeon: [name], MD;  Location: [facility];  Service: Endoscopy;  Laterality: N/A;  . Carpal tunnel release Right   . Cervical fusion  2010    4 rods and pins in place  . Lumbar fusion  2013    rods in hips (to stabilize).    FAMILY HISTORY: Family History  Problem Relation Age of Onset  . Brain cancer Mother   . Hypertension Mother   . Dementia Father   . Hypertension Father   . Breast cancer Sister   . Multiple sclerosis Sister     SOCIAL HISTORY: History   Social History  . Marital Status: Widowed    Spouse Name: N/A  . Number of Children: 1  . Years of Education: 12+ coll.   Occupational History  . Land     retired   Social History Main Topics  . Smoking status: Never Smoker   . Smokeless tobacco: Never Used  . Alcohol Use: No     Comment: heavy  drinker until 1995 - sobriety with AA  . Drug Use: No  . Sexual Activity: Not Currently   Other Topics Concern  . Not on file   Social History Narrative      Married - husband invalid with stroke - widowed in 2009. 1 chil. 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 ACP - not formerly discussed - wishes to be a full code.    Patient is right handed. Patient consumes tea 2-3  times daily.              PHYSICAL EXAM  Filed Vitals:   03/01/15 0949  BP: 100/53  Pulse: 91  Height: 5' 1.25" (1.556 m)  Weight: 173 lb 8 oz (78.699 kg)   Body mass index is 32.5 kg/(m^2). Generalized: Well developed, in no acute distress   Neurological examination  Mentation: Alert oriented to time, place, history taking. Follows all commands speech and language fluent Cranial nerve II-XII: Pupils were equal round reactive to light. Extraocular movements were full, visual field were full on confrontational test. Facial sensation and strength were normal. Uvula tongue midline. Head turning and shoulder shrug were normal and symmetric. Motor: The motor testing reveals 5 over 5 strength of all 4 extremities. Good symmetric motor tone is noted throughout. Left foot drop noted Sensory: Sensory testing is intact to soft touch on all 4 extremities. Pinprick and vibration sensation decreased in the lower extremity extending to the knees. Position sensation intact on the right foot but not the left foot. No evidence of extinction is noted.  Coordination: Cerebellar testing reveals good finger-nose-finger and heel-to-shin bilaterally.  Gait and station: Gait is slightly wide-based. The patient uses a rolling walker to ambulate. Tandem gait not attempted Romberg is negative. No drift is seen.  Reflexes: Deep tendon reflexes are symmetric and normal bilaterally.   DIAGNOSTIC DATA (LABS, IMAGING, TESTING) -     Component Value Date/Time   NA 139 01/06/2015 1408   K 4.1 01/06/2015  1408   CL 102 01/06/2015 1408   CO2 31  01/06/2015 1408   GLUCOSE 110* 01/06/2015 1408   GLUCOSE 132* 10/01/2006 1605   BUN 23 01/06/2015 1408   CREATININE 1.26* 01/06/2015 1408   CREATININE 2.28* 02/08/2011 0819   CALCIUM 9.6 01/06/2015 1408   PROT 7.4 01/06/2015 1408   ALBUMIN 3.8 01/06/2015 1408   AST 23 01/06/2015 1408   ALT 21 01/06/2015 1408   ALKPHOS 95 01/06/2015 1408   BILITOT 1.0 01/06/2015 1408   GFRNONAA 34* 09/04/2013 1559   GFRAA 40* 09/04/2013 1559   Lab Results  Component Value Date   CHOL 142 01/06/2015   HDL 48.50 01/06/2015   LDLCALC 72 01/06/2015   TRIG 107.0 01/06/2015   CHOLHDL 3 01/06/2015     ASSESSMENT AND PLAN  74 y.o. year old female  has a past medical history of History of breast cancer; Osteoporosis; Idiopathic pulmonary fibrosis; Bronchiectasis; Vitamin D deficiency; Hyperlipidemia; Allergic rhinitis; GERD (gastroesophageal reflux disease); Left knee DJD; Anxiety; Migraine; Diverticulosis of colon; Seizure disorder; Temporal arteritis; CVA (cerebral infarction) (10/2007); PMR (polymyalgia rheumatica); Anemia; Gait disorder; Peripheral neuropathy; HTN (hypertension); Type II or unspecified type diabetes mellitus without mention of complication, not stated as uncontrolled; Renal insufficiency; Previous back surgery (10/09/12); and Polyneuropathy in other diseases classified elsewhere (02/17/2013). here to follow-up for 1. Temporal arteritis 2. Polyneuropathy 3. Left foot drop  Will check sedimentation rate today Continue prednisone 6 mg daily does not need refills Continue to take gabapentin as directed Continue physical therapy appointments and then perform exercises at least daily once physical therapy has concluded Follow-up in 6 months Next appointment with Dr. [name] then return to [facility] nurse practitioner Dennie Bible, [facility], [facility], [facility] [address] [place], [place] [address] [phone]

## 2015-03-01 NOTE — Progress Notes (Signed)
I have read the note, and I agree with the clinical assessment and plan.  [name] KEITH   

## 2015-03-01 NOTE — Therapy (Signed)
[facility] [address] [place], [place], [address] Phone: [phone]   Fax:  [phone]  Physical Therapy Treatment  Patient Details  Name: [name] MRN: [id number] Date of Birth: 09-12-1941 Referring Provider:  Eulas Post, MD  Encounter Date: 03/01/2015      PT End of Session - 03/01/15 1412    Visit Number 8   Number of Visits 17   Date for PT Re-Evaluation 03/11/15   Authorization Type Medicare Primary-G code required; Tricare secondary-no PTA   PT Start Time 1315   PT Stop Time 1400   PT Time Calculation (min) 45 min   Equipment Utilized During Treatment Gait belt   Activity Tolerance Patient tolerated treatment well   Behavior During Therapy [facility] for tasks assessed/performed      Past Medical History  Diagnosis Date  . History of breast cancer   . Osteoporosis   . Idiopathic pulmonary fibrosis   . Bronchiectasis   . Vitamin D deficiency   . Hyperlipidemia   . Allergic rhinitis   . GERD (gastroesophageal reflux disease)   . Left knee DJD   . Anxiety   . Migraine   . Diverticulosis of colon   . Seizure disorder   . Temporal arteritis     Right eye blind, on steroids per [facility]/ Dr [name]  . CVA (cerebral infarction) 10/2007    Right thalamic   . PMR (polymyalgia rheumatica)   . Anemia   . Gait disorder   . Peripheral neuropathy   . HTN (hypertension)   . Type II or unspecified type diabetes mellitus without mention of complication, not stated as uncontrolled     2nd to steriods  . Renal insufficiency   . Previous back surgery 10/09/12  . Polyneuropathy in other diseases classified elsewhere 02/17/2013    Past Surgical History  Procedure Laterality Date  . Mastectomy    . Cholecystectomy    . Total knee arthroplasty      Left  . Tonsillectomy    . Cataract extraction      OS - Summer 2010  . Lumbar fusion  04/2010    W/Mechanical fixation - [facility]  .  Spinal fusion  07/2010    T10-L2 interbody fusion / [facility]  . Colonoscopy  12/15/2011    Procedure: COLONOSCOPY;  Surgeon: [name], MD;  Location: [facility];  Service: Endoscopy;  Laterality: N/A;  . Carpal tunnel release Right   . Cervical fusion  2010    4 rods and pins in place  . Lumbar fusion  2013    rods in hips (to stabilize).    There were no vitals filed for this visit.  Visit Diagnosis:  Lack of coordination  Posture abnormality  Muscle weakness (generalized)  Difficulty walking      Subjective Assessment - 03/01/15 1318    Subjective Pt denied falls or changes since last visit. pt reported she feels tired and does not have a lot energy and has not been able to exercise for the last few days.    Pertinent History Hx of 3 TIAs or ministrokes prior to 2009; hx of 1 seizure 2009   Patient Stated Goals pt would like to walk with a cane, and improve balance   Currently in Pain? Yes   Pain Score 3    Pain Location Abdomen   Pain Orientation Mid   Pain Descriptors / Indicators Aching  muscle ache in abs due to  lifting rollator   Pain Type Chronic pain   Pain Onset 1 to 4 weeks ago   Pain Frequency Constant   Aggravating Factors  lifting rollator   Pain Relieving Factors  rest       Therex and Neuro re-ed: Pt performed and reviewed balance, strengthening, and flexibility HEP with supervision and cues for technique. See pt instructions for details (type, frequency, reps, duration).                          PT Education - 03/01/15 1412    Education provided Yes   Education Details Discussed fall prevention handout. Reviewed and performed HEP. PT discussed the importance of performing HEP, even in she only performs one exercise per day, in order to improve strength, endurance, balance and safety.   Person(s) Educated Patient   Methods Explanation;Demonstration;Verbal cues;Handout   Comprehension Verbalized understanding;Returned  demonstration          PT Short Term Goals - 03/01/15 1416    PT SHORT TERM GOAL #1   Title Pt will demonstrate correct performance of HEP to address balance impairment and postural impairment. Target 02/11/15   Status Partially Met   PT SHORT TERM GOAL #2   Title Pt will increase score on Berg Balance Test to 43/56 for improving balance. Target 02/11/15   Status Achieved  49/56 on 02/17/15   PT SHORT TERM GOAL #3   Title Pt will perform TUG safely with single point cane for increased efficiency of household ambulation and progress toward using the cane in her home. Target 02/11/15   Status Achieved  12.22 seconds safely with cane.    PT SHORT TERM GOAL #4   Title Pt will ambulate 200' with single point cane on indoor level surface MOD I for progress to household ambulation with cane. Target 02/11/15   Status Not Met  Mod I x150' then supervision due to increased unsteadiness with fatigue; will continue to LTGs.           PT Long Term Goals - 03/01/15 1416    PT LONG TERM GOAL #1   Title Pt will verbalize understanding of fall prevention strategies in home environment. Target 03/11/15   Status On-going   PT LONG TERM GOAL #2   Title Pt will verbalize understanding of CVA warning signs and risk factors due to her reports of having hx of "mini-strokes." Target 03/11/15   Status On-going   PT LONG TERM GOAL #3   Title Pt will increase Berg Balance Test score to 48/56 for decreased fall risk. Target 03/11/15   Status On-going   PT LONG TERM GOAL #4   Title Pt will ambulate 200' on outdoor concrete surfaces with a cane and supervision for increased independence when out in the community with friends or family. Target 03/11/15   Status On-going   PT LONG TERM GOAL #5   Title Pt will safely perform TUG with cane in <13.5 seconds for decreased fall risk. Target 03/11/15   Status On-going   PT LONG TERM GOAL #6   Title Pt will ambulate 200' with single point cane on indoor level surface MOD  I for progress to household ambulation with cane. Target 03/11/15 (continued from un-met STG)   Status On-going               Plan - 03/01/15 1413    Clinical Impression Statement Pt demonstrated progress as she tolerated HEP well, however, she was  did not meet STG (HEP) 1 goal, as she required cues for technique during hamstring stretch and SLS activity. PT did not progress current HEP as it is still challenging for pt. Pt would continue to benefit from skilled PT to improve balance, endurance, strength, and safety during functional mobility.   Pt will benefit from skilled therapeutic intervention in order to improve on the following deficits Abnormal gait;Decreased coordination;Improper body mechanics;Decreased activity tolerance;Decreased strength;Postural dysfunction;Decreased balance;Difficulty walking   Rehab Potential Good   Clinical Impairments Affecting Rehab Potential chronic nature of postural impairment and hx of multiple spinal surgeries   PT Frequency 2x / week   PT Duration 8 weeks   PT Treatment/Interventions ADLs/Self Care Home Management;Gait training;Therapeutic exercise;Patient/family education;Balance training;Neuromuscular re-education;Therapeutic activities;DME Instruction   PT Next Visit Plan Check LTGs and renew if appropriate. Pt is going on vacation next week.    Consulted and Agree with Plan of Care Patient        Problem List Patient Active Problem List   Diagnosis Date Noted  . Obesity (BMI 30-39.9) 04/06/2014  . Shortness of breath 07/14/2013  . Bone cyst 07/14/2013  . Polyneuropathy in other diseases classified elsewhere 02/17/2013  . Chronic back pain 12/09/2012  . Peripheral edema 09/25/2012  . Anemia due to chronic illness 09/01/2012  . Cervicalgia 07/11/2012  . Disturbance of skin sensation 07/11/2012  . Temporal arteritis 04/01/2012  . Lower GI bleed 12/24/2011  . Rectal bleed 12/13/2011  . Chronic renal insufficiency, stage II (mild)  04/04/2011  . LUMBAR RADICULOPATHY, LEFT 04/18/2009  . Essential hypertension 12/30/2007  . SEIZURE DISORDER 12/30/2007  . CEREBROVASCULAR ACCIDENT, HX OF 12/30/2007  . Hyperlipidemia 10/08/2007  . GERD 10/08/2007  . DIVERTICULOSIS, COLON 10/08/2007  . BRONCHIECTASIS 06/26/2007  . PULMONARY FIBROSIS 05/17/2007  . Osteoporosis 05/17/2007  . BREAST CANCER, HX OF 05/17/2007  . MASTECTOMY, LEFT, HX OF 05/17/2007    Keshawna Dix L 03/01/2015, 2:18 PM  [facility] [address] [place], [place], [address] Phone: [phone]   Fax:  [phone]      Geoffry Paradise, PT,DPT 03/01/2015 2:18 PM Phone: [phone] Fax: [phone]

## 2015-03-02 ENCOUNTER — Ambulatory Visit: Payer: Medicare Other

## 2015-03-02 ENCOUNTER — Telehealth: Payer: Self-pay | Admitting: *Deleted

## 2015-03-02 DIAGNOSIS — M6281 Muscle weakness (generalized): Secondary | ICD-10-CM

## 2015-03-02 DIAGNOSIS — R262 Difficulty in walking, not elsewhere classified: Secondary | ICD-10-CM

## 2015-03-02 DIAGNOSIS — M0589 Other rheumatoid arthritis with rheumatoid factor of multiple sites: Secondary | ICD-10-CM | POA: Diagnosis not present

## 2015-03-02 DIAGNOSIS — R279 Unspecified lack of coordination: Secondary | ICD-10-CM

## 2015-03-02 DIAGNOSIS — R293 Abnormal posture: Secondary | ICD-10-CM | POA: Diagnosis not present

## 2015-03-02 LAB — SEDIMENTATION RATE: Sed Rate: 8 mm/hr (ref 0–40)

## 2015-03-02 NOTE — Progress Notes (Signed)
Quick Note:  I called and gave the lab results to pt. Sed rate is 8, stay at prednisone 6mg  po daily. Pt verbalized understanding. ______

## 2015-03-02 NOTE — Therapy (Signed)
[facility] [address] [place], [place], [address] Phone: [phone]   Fax:  [phone]  Physical Therapy Treatment  Patient Details  Name: [name] MRN: [id number] Date of Birth: 12-21-1940 Referring Provider:  Eulas Post, MD  Encounter Date: 03/02/2015      PT End of Session - 03/02/15 1012    Visit Number 9   Number of Visits 17   Date for PT Re-Evaluation 04/09/15   Authorization Type Medicare Primary-G code required; Tricare secondary-no PTA   PT Start Time 0931   PT Stop Time 1013   PT Time Calculation (min) 42 min   Equipment Utilized During Treatment Gait belt   Activity Tolerance Patient tolerated treatment well   Behavior During Therapy [facility] for tasks assessed/performed      Past Medical History  Diagnosis Date  . History of breast cancer   . Osteoporosis   . Idiopathic pulmonary fibrosis   . Bronchiectasis   . Vitamin D deficiency   . Hyperlipidemia   . Allergic rhinitis   . GERD (gastroesophageal reflux disease)   . Left knee DJD   . Anxiety   . Migraine   . Diverticulosis of colon   . Seizure disorder   . Temporal arteritis     Right eye blind, on steroids per [facility]/ Dr [name]  . CVA (cerebral infarction) 10/2007    Right thalamic   . PMR (polymyalgia rheumatica)   . Anemia   . Gait disorder   . Peripheral neuropathy   . HTN (hypertension)   . Type II or unspecified type diabetes mellitus without mention of complication, not stated as uncontrolled     2nd to steriods  . Renal insufficiency   . Previous back surgery 10/09/12  . Polyneuropathy in other diseases classified elsewhere 02/17/2013    Past Surgical History  Procedure Laterality Date  . Mastectomy    . Cholecystectomy    . Total knee arthroplasty      Left  . Tonsillectomy    . Cataract extraction      OS - Summer 2010  . Lumbar fusion  04/2010    W/Mechanical fixation - [facility]  .  Spinal fusion  07/2010    T10-L2 interbody fusion / [facility]  . Colonoscopy  12/15/2011    Procedure: COLONOSCOPY;  Surgeon: [name], MD;  Location: [facility];  Service: Endoscopy;  Laterality: N/A;  . Carpal tunnel release Right   . Cervical fusion  2010    4 rods and pins in place  . Lumbar fusion  2013    rods in hips (to stabilize).    There were no vitals filed for this visit.  Visit Diagnosis:  Difficulty walking - Plan: PT plan of care cert/re-cert  Lack of coordination - Plan: PT plan of care cert/re-cert  Posture abnormality - Plan: PT plan of care cert/re-cert  Muscle weakness (generalized) - Plan: PT plan of care cert/re-cert      Subjective Assessment - 03/02/15 0933    Subjective Pt denied falls or changes since yesterday.    Pertinent History Hx of 3 TIAs or ministrokes prior to 2009; hx of 1 seizure 2009   Patient Stated Goals pt would like to walk with a cane, and improve balance   Currently in Pain? Yes   Pain Score 4    Pain Location Back   Pain Orientation Lower   Pain Descriptors / Indicators Aching   Pain Type  Chronic pain   Pain Onset More than a month ago   Pain Frequency Constant   Aggravating Factors  lifting   Pain Relieving Factors rest                         [facility] Adult PT Treatment/Exercise - 03/02/15 0934    Ambulation/Gait   Ambulation/Gait Yes   Ambulation/Gait Assistance 6: Modified independent (Device/Increase time);5: Supervision  supervision with SPC and MOD I with rollator   Ambulation/Gait Assistance Details Pt required supervison during last 200' of amb. outdoors and one standing rest break (after 170') due to back pain and fatigue. Mod I with rollator and supervision with SPC. Pt required supervision indoors during one LOB episode, which she self corrected with SPC.   Ambulation Distance (Feet) --  450' in/outdoors, 230' indoors with SPC, 75'x2 with rollator   Assistive device Rollator;Straight cane    Gait Pattern Decreased dorsiflexion - left;Decreased weight shift to left;Left flexed knee in stance;Lateral trunk lean to left;Trunk flexed   Ambulation Surface Level;Indoor;Unlevel;Paved;Outdoor   Standardized Balance Assessment   Standardized Balance Assessment Furniture conservator/restorer   Berg Balance Test   Sit to Stand Able to stand without using hands and stabilize independently   Standing Unsupported Able to stand safely 2 minutes   Sitting with Back Unsupported but Feet Supported on Floor or Stool Able to sit safely and securely 2 minutes   Stand to Sit Sits safely with minimal use of hands   Transfers Able to transfer safely, minor use of hands   Standing Unsupported with Eyes Closed Able to stand 10 seconds safely   Standing Ubsupported with Feet Together Able to place feet together independently and stand 1 minute safely   From Standing, Reach Forward with Outstretched Arm Can reach confidently >25 cm (10")   From Standing Position, Pick up Object from Floor Able to pick up shoe safely and easily   From Standing Position, Turn to Look Behind Over each Shoulder Turn sideways only but maintains balance   Turn 360 Degrees Able to turn 360 degrees safely one side only in 4 seconds or less   Standing Unsupported, Alternately Place Feet on Step/Stool Able to stand independently and safely and complete 8 steps in 20 seconds   Standing Unsupported, One Foot in Front Able to plae foot ahead of the other independently and hold 30 seconds   Standing on One Leg Able to lift leg independently and hold 5-10 seconds   Total Score 51                PT Education - 03/02/15 1625    Education provided Yes   Education Details PT discussed continuing PT for 4 additional weeks to improve strength, gait with SPC, and balance; and pt agreeable. Discussed outcome measure and goal progress.   Person(s) Educated Patient   Methods Explanation   Comprehension Verbalized understanding          PT  Short Term Goals - 03/01/15 1416    PT SHORT TERM GOAL #1   Title Pt will demonstrate correct performance of HEP to address balance impairment and postural impairment. Target 02/11/15   Status Partially Met   PT SHORT TERM GOAL #2   Title Pt will increase score on Berg Balance Test to 43/56 for improving balance. Target 02/11/15   Status Achieved  49/56 on 02/17/15   PT SHORT TERM GOAL #3   Title Pt will perform TUG safely  with single point cane for increased efficiency of household ambulation and progress toward using the cane in her home. Target 02/11/15   Status Achieved  12.22 seconds safely with cane.    PT SHORT TERM GOAL #4   Title Pt will ambulate 200' with single point cane on indoor level surface MOD I for progress to household ambulation with cane. Target 02/11/15   Status Not Met  Mod I x150' then supervision due to increased unsteadiness with fatigue; will continue to LTGs.           PT Long Term Goals - 03/02/15 1628    PT LONG TERM GOAL #1   Title Pt will verbalize understanding of fall prevention strategies in home environment. Target 03/11/15   Status On-going   PT LONG TERM GOAL #2   Title Pt will verbalize understanding of CVA warning signs and risk factors due to her reports of having hx of "mini-strokes." Target 03/11/15   Status On-going   PT LONG TERM GOAL #3   Title Pt will increase Berg Balance Test score to 55/56 for decreased fall risk. Target 03/11/15   Baseline Revised from 48/56 to 55/56, as pt scored a 51/56 on 03/02/15.   Status Revised   PT LONG TERM GOAL #4   Title Pt will ambulate 200' on outdoor concrete surfaces with a cane and supervision for increased independence when out in the community with friends or family. Target 03/11/15   Status Partially Met   PT LONG TERM GOAL #5   Title Pt will safely perform TUG with cane in <13.5 seconds for decreased fall risk. Target 03/11/15   Baseline 11.15sec. with SPC   Status Achieved   PT LONG TERM GOAL #6    Title Pt will ambulate 200' with single point cane on indoor level surface MOD I for progress to household ambulation with cane. Target 03/11/15 (continued from un-met STG)   Status Partially Met               Plan - 03/02/15 1626    Clinical Impression Statement Pt met LTG 3 and 5. Pt partially met LTGs 4 and 6, but occasionally required supervision during amb. with SPC due to LOB. Pt required frequent seated rest breaks during BERG due to back pain. Pt would benefit from continued skilled PT to improve balance, safety during amb. with SPC, and strength/endurance. PT will renew for 4 addtional weeks. Continue with POC.    Pt will benefit from skilled therapeutic intervention in order to improve on the following deficits Abnormal gait;Decreased coordination;Improper body mechanics;Decreased activity tolerance;Decreased strength;Postural dysfunction;Decreased balance;Difficulty walking   Rehab Potential Good   Clinical Impairments Affecting Rehab Potential chronic nature of postural impairment and hx of multiple spinal surgeries   PT Frequency 2x / week   PT Duration --  8 weeks plus 4 more weeks   PT Treatment/Interventions ADLs/Self Care Home Management;Gait training;Therapeutic exercise;Patient/family education;Balance training;Neuromuscular re-education;Therapeutic activities;DME Instruction   PT Next Visit Plan G-code, dynamic gait training with SPC and core strengthening.   Consulted and Agree with Plan of Care Patient        Problem List Patient Active Problem List   Diagnosis Date Noted  . Obesity (BMI 30-39.9) 04/06/2014  . Shortness of breath 07/14/2013  . Bone cyst 07/14/2013  . Polyneuropathy in other diseases classified elsewhere 02/17/2013  . Chronic back pain 12/09/2012  . Peripheral edema 09/25/2012  . Anemia due to chronic illness 09/01/2012  . Cervicalgia 07/11/2012  . Disturbance  of skin sensation 07/11/2012  . Temporal arteritis 04/01/2012  . Lower GI bleed  12/24/2011  . Rectal bleed 12/13/2011  . Chronic renal insufficiency, stage II (mild) 04/04/2011  . LUMBAR RADICULOPATHY, LEFT 04/18/2009  . Essential hypertension 12/30/2007  . SEIZURE DISORDER 12/30/2007  . CEREBROVASCULAR ACCIDENT, HX OF 12/30/2007  . Hyperlipidemia 10/08/2007  . GERD 10/08/2007  . DIVERTICULOSIS, COLON 10/08/2007  . BRONCHIECTASIS 06/26/2007  . PULMONARY FIBROSIS 05/17/2007  . Osteoporosis 05/17/2007  . BREAST CANCER, HX OF 05/17/2007  . MASTECTOMY, LEFT, HX OF 05/17/2007    Adelia Baptista L 03/02/2015, 4:33 PM  [facility] [address] [place], [place], [address] Phone: [phone]   Fax:  [phone]     Geoffry Paradise, PT,DPT 03/02/2015 4:33 PM Phone: [phone] Fax: [phone]

## 2015-03-02 NOTE — Telephone Encounter (Signed)
I called and LMVM for pt to return call for lab results.

## 2015-03-02 NOTE — Telephone Encounter (Signed)
-----   Message from Dennie Bible, NP sent at 03/02/2015  8:34 AM EDT ----- Sed rate 8 please continue Prednisone at 6mg  daily

## 2015-03-02 NOTE — Telephone Encounter (Signed)
I called pt and gave her the results of the lab work.   See result note.

## 2015-03-02 NOTE — Telephone Encounter (Signed)
Patient called returning Sandy's call. Please call and advise. Patient can be reached at [phone].

## 2015-03-15 ENCOUNTER — Ambulatory Visit: Payer: Medicare Other

## 2015-03-16 ENCOUNTER — Ambulatory Visit: Payer: Medicare Other

## 2015-03-22 ENCOUNTER — Ambulatory Visit: Payer: Medicare Other

## 2015-03-22 DIAGNOSIS — M6281 Muscle weakness (generalized): Secondary | ICD-10-CM | POA: Diagnosis not present

## 2015-03-22 DIAGNOSIS — R262 Difficulty in walking, not elsewhere classified: Secondary | ICD-10-CM

## 2015-03-22 DIAGNOSIS — R279 Unspecified lack of coordination: Secondary | ICD-10-CM

## 2015-03-22 DIAGNOSIS — R293 Abnormal posture: Secondary | ICD-10-CM | POA: Diagnosis not present

## 2015-03-22 NOTE — Therapy (Signed)
[facility] [address] [place], [place], [address] Phone: [phone]   Fax:  [phone]  Physical Therapy Treatment  Patient Details  Name: RUWAYDA CURET MRN: [id number] Date of Birth: 1940/11/04 Referring Provider:  Eulas Post, MD  Encounter Date: 03/22/2015      PT End of Session - 03/22/15 1440    Visit Number 10   Number of Visits 17   Date for PT Re-Evaluation 04/09/15   Authorization Type Medicare Primary-G code required; Tricare secondary-no PTA   PT Start Time 1405   PT Stop Time 1434   PT Time Calculation (min) 29 min   Equipment Utilized During Treatment Gait belt   Activity Tolerance Patient tolerated treatment well   Behavior During Therapy [facility] for tasks assessed/performed      Past Medical History  Diagnosis Date  . History of breast cancer   . Osteoporosis   . Idiopathic pulmonary fibrosis   . Bronchiectasis   . Vitamin D deficiency   . Hyperlipidemia   . Allergic rhinitis   . GERD (gastroesophageal reflux disease)   . Left knee DJD   . Anxiety   . Migraine   . Diverticulosis of colon   . Seizure disorder   . Temporal arteritis     Right eye blind, on steroids per [facility]/ Dr [name]  . CVA (cerebral infarction) 10/2007    Right thalamic   . PMR (polymyalgia rheumatica)   . Anemia   . Gait disorder   . Peripheral neuropathy   . HTN (hypertension)   . Type II or unspecified type diabetes mellitus without mention of complication, not stated as uncontrolled     2nd to steriods  . Renal insufficiency   . Previous back surgery 10/09/12  . Polyneuropathy in other diseases classified elsewhere 02/17/2013    Past Surgical History  Procedure Laterality Date  . Mastectomy    . Cholecystectomy    . Total knee arthroplasty      Left  . Tonsillectomy    . Cataract extraction      OS - Summer 2010  . Lumbar fusion  04/2010    W/Mechanical fixation - [facility]  .  Spinal fusion  07/2010    T10-L2 interbody fusion / [facility]  . Colonoscopy  12/15/2011    Procedure: COLONOSCOPY;  Surgeon: [name], MD;  Location: [facility];  Service: Endoscopy;  Laterality: N/A;  . Carpal tunnel release Right   . Cervical fusion  2010    4 rods and pins in place  . Lumbar fusion  2013    rods in hips (to stabilize).    There were no vitals filed for this visit.  Visit Diagnosis:  Difficulty walking  Posture abnormality  Lack of coordination  Muscle weakness (generalized)      Subjective Assessment - 03/22/15 1406    Subjective Pt denied falls or changes. Pt requesting to cease PT as she is caring for her sister, who has CA.   Pertinent History Hx of 3 TIAs or ministrokes prior to 2009; hx of 1 seizure 2009   Patient Stated Goals pt would like to walk with a cane, and improve balance   Currently in Pain? No/denies                         [facility] Adult PT Treatment/Exercise - 03/22/15 1417    Ambulation/Gait   Ambulation/Gait Yes   Ambulation/Gait Assistance  6: Modified independent (Device/Increase time);5: Supervision   Ambulation/Gait Assistance Details No LOB   Ambulation Distance (Feet) 200 Feet   Assistive device Rollator   Gait Pattern Decreased dorsiflexion - left;Decreased weight shift to left;Left flexed knee in stance;Lateral trunk lean to left;Trunk flexed   Ambulation Surface Level;Indoor   Gait velocity 3.51f/sec  with rollator   Standardized Balance Assessment   Standardized Balance Assessment Timed Up and Go Test   Berg Balance Test   Sit to Stand Able to stand without using hands and stabilize independently   Standing Unsupported Able to stand safely 2 minutes   Sitting with Back Unsupported but Feet Supported on Floor or Stool Able to sit safely and securely 2 minutes   Stand to Sit Sits safely with minimal use of hands   Transfers Able to transfer safely, minor use of hands   Standing Unsupported with  Eyes Closed Able to stand 10 seconds safely   Standing Ubsupported with Feet Together Able to place feet together independently and stand 1 minute safely   From Standing, Reach Forward with Outstretched Arm Can reach confidently >25 cm (10")   From Standing Position, Pick up Object from Floor Able to pick up shoe safely and easily   From Standing Position, Turn to Look Behind Over each Shoulder Turn sideways only but maintains balance   Turn 360 Degrees Able to turn 360 degrees safely in 4 seconds or less   Standing Unsupported, Alternately Place Feet on Step/Stool Able to stand independently and safely and complete 8 steps in 20 seconds   Standing Unsupported, One Foot in Front Able to plae foot ahead of the other independently and hold 30 seconds   Standing on One Leg Able to lift leg independently and hold > 10 seconds   Total Score 53   Timed Up and Go Test   TUG Normal TUG   Normal TUG (seconds) 10.52  with rollator and 10.37sec. without rollator                PT Education - 03/22/15 1439    Education provided Yes   Education Details PT discussed continuing to perform HEP in order to maintain gains made in PT. PT discussed d/c today per pt's request, as she is taking care of her sister who has cancer. Pt would like to resume PT once her sister is better. PT educated pt that she would need a new referral for PT at that time.   Person(s) Educated Patient   Methods Explanation   Comprehension Verbalized understanding          PT Short Term Goals - 03/01/15 1416    PT SHORT TERM GOAL #1   Title Pt will demonstrate correct performance of HEP to address balance impairment and postural impairment. Target 02/11/15   Status Partially Met   PT SHORT TERM GOAL #2   Title Pt will increase score on Berg Balance Test to 43/56 for improving balance. Target 02/11/15   Status Achieved  49/56 on 02/17/15   PT SHORT TERM GOAL #3   Title Pt will perform TUG safely with single point cane  for increased efficiency of household ambulation and progress toward using the cane in her home. Target 02/11/15   Status Achieved  12.22 seconds safely with cane.    PT SHORT TERM GOAL #4   Title Pt will ambulate 200' with single point cane on indoor level surface MOD I for progress to household ambulation with cane. Target 02/11/15  Status Not Met  Mod I x150' then supervision due to increased unsteadiness with fatigue; will continue to LTGs.           PT Long Term Goals - 2015/03/31 1441    PT LONG TERM GOAL #1   Title Pt will verbalize understanding of fall prevention strategies in home environment. Target 04/09/15   Status Partially Met   PT LONG TERM GOAL #2   Title Pt will verbalize understanding of CVA warning signs and risk factors due to her reports of having hx of "mini-strokes." Target 04/09/15   Status Partially Met   PT LONG TERM GOAL #3   Title Pt will increase Berg Balance Test score to 55/56 for decreased fall risk. Target 04/09/15   Baseline --   Status Partially Met   PT LONG TERM GOAL #4   Title Pt will ambulate 200' on outdoor concrete surfaces with a cane and supervision for increased independence when out in the community with friends or family. Target 04/09/15   Status Partially Met   PT LONG TERM GOAL #5   Title Pt will safely perform TUG with cane in <13.5 seconds for decreased fall risk. Target 03/11/15   Baseline --   Status Achieved   PT LONG TERM GOAL #6   Title Pt will ambulate 200' with single point cane on indoor level surface MOD I for progress to household ambulation with cane. Target 03/11/15 (continued from un-met STG)   Status Partially Met               Plan - March 31, 2015 1441    Clinical Impression Statement Pt discharging from PT today, due to pt request (she is caring for her sister who has cancer). Please see d/c summary for details.          G-Codes - 03-31-2015 1443    Functional Assessment Tool Used BERG: 53/56; gait speed:  3.67f/sec.; TUG without AD: 10.37sec.; TUG with rollator: 10.52sec.   Functional Limitation Mobility: Walking and moving around   Mobility: Walking and Moving Around Goal Status [phone] At least 1 percent but less than 20 percent impaired, limited or restricted   Mobility: Walking and Moving Around Discharge Status [phone] At least 1 percent but less than 20 percent impaired, limited or restricted      Problem List Patient Active Problem List   Diagnosis Date Noted  . Obesity (BMI 30-39.9) 04/06/2014  . Shortness of breath 07/14/2013  . Bone cyst 07/14/2013  . Polyneuropathy in other diseases classified elsewhere 02/17/2013  . Chronic back pain 12/09/2012  . Peripheral edema 09/25/2012  . Anemia due to chronic illness 09/01/2012  . Cervicalgia 07/11/2012  . Disturbance of skin sensation 07/11/2012  . Temporal arteritis 04/01/2012  . Lower GI bleed 12/24/2011  . Rectal bleed 12/13/2011  . Chronic renal insufficiency, stage II (mild) 04/04/2011  . LUMBAR RADICULOPATHY, LEFT 04/18/2009  . Essential hypertension 12/30/2007  . SEIZURE DISORDER 12/30/2007  . CEREBROVASCULAR ACCIDENT, HX OF 12/30/2007  . Hyperlipidemia 10/08/2007  . GERD 10/08/2007  . DIVERTICULOSIS, COLON 10/08/2007  . BRONCHIECTASIS 06/26/2007  . PULMONARY FIBROSIS 05/17/2007  . Osteoporosis 05/17/2007  . BREAST CANCER, HX OF 05/17/2007  . MASTECTOMY, LEFT, HX OF 05/17/2007    Neilah Fulwider L 607/07/16 2:46 PM  [place] [place] [address]Phone: [phone]  Fax:  [phone] PHYSICAL THERAPY DISCHARGE SUMMARY  Visits from Start of Care: 10  Current functional level related to goals /  functional outcomes:     PT Long Term Goals - 03/22/15 1441    PT LONG TERM GOAL #1   Title Pt will verbalize understanding of fall prevention strategies in home environment. Target 04/09/15   Status Partially Met   PT LONG TERM  GOAL #2   Title Pt will verbalize understanding of CVA warning signs and risk factors due to her reports of having hx of "mini-strokes." Target 04/09/15   Status Partially Met   PT LONG TERM GOAL #3   Title Pt will increase Berg Balance Test score to 55/56 for decreased fall risk. Target 04/09/15   Baseline --   Status Partially Met   PT LONG TERM GOAL #4   Title Pt will ambulate 200' on outdoor concrete surfaces with a cane and supervision for increased independence when out in the community with friends or family. Target 04/09/15   Status Partially Met   PT LONG TERM GOAL #5   Title Pt will safely perform TUG with cane in <13.5 seconds for decreased fall risk. Target 03/11/15   Baseline --   Status Achieved   PT LONG TERM GOAL #6   Title Pt will ambulate 200' with single point cane on indoor level surface MOD I for progress to household ambulation with cane. Target 03/11/15 (continued from un-met STG)   Status Partially Met        Remaining deficits: Impaired posture and gait with [facility]   Education / Equipment: HEP. Pt's request, as she is caring for sister with cancer. Pt would like to resume PT once her sister is better.  Plan: Patient agrees to discharge.  Patient goals were partially met. Patient is being discharged due to the patient's request.  ?????        Geoffry Paradise, PT,DPT 03/22/2015 2:46 PM Phone: [phone] Fax: [phone]

## 2015-03-24 DIAGNOSIS — N179 Acute kidney failure, unspecified: Secondary | ICD-10-CM | POA: Diagnosis not present

## 2015-03-24 DIAGNOSIS — N183 Chronic kidney disease, stage 3 (moderate): Secondary | ICD-10-CM | POA: Diagnosis not present

## 2015-03-24 DIAGNOSIS — I129 Hypertensive chronic kidney disease with stage 1 through stage 4 chronic kidney disease, or unspecified chronic kidney disease: Secondary | ICD-10-CM | POA: Diagnosis not present

## 2015-03-24 DIAGNOSIS — E669 Obesity, unspecified: Secondary | ICD-10-CM | POA: Diagnosis not present

## 2015-03-25 ENCOUNTER — Ambulatory Visit: Payer: Medicare Other

## 2015-03-29 ENCOUNTER — Ambulatory Visit: Payer: Medicare Other

## 2015-03-31 ENCOUNTER — Ambulatory Visit: Payer: Medicare Other

## 2015-04-05 ENCOUNTER — Ambulatory Visit: Payer: Medicare Other

## 2015-04-07 ENCOUNTER — Ambulatory Visit: Payer: Medicare Other

## 2015-04-07 ENCOUNTER — Encounter: Payer: Self-pay | Admitting: Family Medicine

## 2015-04-07 ENCOUNTER — Ambulatory Visit (INDEPENDENT_AMBULATORY_CARE_PROVIDER_SITE_OTHER): Payer: Medicare Other | Admitting: Family Medicine

## 2015-04-07 VITALS — BP 120/72 | HR 82 | Temp 98.3°F | Ht 61.25 in | Wt 167.9 lb

## 2015-04-07 DIAGNOSIS — M549 Dorsalgia, unspecified: Secondary | ICD-10-CM

## 2015-04-07 DIAGNOSIS — G8929 Other chronic pain: Secondary | ICD-10-CM

## 2015-04-07 DIAGNOSIS — R1310 Dysphagia, unspecified: Secondary | ICD-10-CM

## 2015-04-07 MED ORDER — OXYCODONE HCL ER 10 MG PO T12A: EXTENDED_RELEASE_TABLET | ORAL | Status: DC

## 2015-04-07 MED ORDER — PANTOPRAZOLE SODIUM 40 MG PO TBEC: 40.0000 mg | DELAYED_RELEASE_TABLET | Freq: Every day | ORAL | Status: DC

## 2015-04-07 MED ORDER — OXYCODONE HCL ER 10 MG PO T12A: 10.0000 mg | EXTENDED_RELEASE_TABLET | Freq: Two times a day (BID) | ORAL | Status: DC

## 2015-04-07 NOTE — Patient Instructions (Signed)
Please be in touch if substernal pain no better in 2-3 weeks If resolving on the Protonix, may stop that medication in 4-6 weeks Always take Doxycycline with a full glass of water and remain sitting up Avoid eating within 2-3 hours of bedtime Consider elevate head of bed 6-8 inches.

## 2015-04-07 NOTE — Progress Notes (Signed)
Subjective:    Patient ID: [name], female    DOB: 10-10-1940, 74 y.o.   MRN: [id number]  HPI    patient seen with new complaint of one-month history of odynophagia. Symptoms are relatively mild. Her pain extends from her epigastric up toward the mid substernal region. No exertional symptoms. No dyspnea. She's had good appetite. She has lost some weight but she states is due to her efforts. She is taking doxycycline per endodontist but is not aware of any difficulty swallowing pills. She occasionally takes pills without a lot of fluid. No recent melena. No NSAID use. She does take low-dose prednisone per neurology for temporal arteritis but no history of any recent thrush.    occasional postprandial nausea but no vomiting   No recent stool changes.   Chronic back pain and requesting refills of OxyContin 10 mg twice a day which she has been on for many years. Her back pain is currently stable. She supplements with oxycodone as needed  Wt Readings from Last 3 Encounters:  04/07/15 167 lb 14.4 oz (76.159 kg)  03/01/15 173 lb 8 oz (78.699 kg)  01/06/15 175 lb (79.379 kg)     Past Medical History  Diagnosis Date  . History of breast cancer   . Osteoporosis   . Idiopathic pulmonary fibrosis   . Bronchiectasis   . Vitamin D deficiency   . Hyperlipidemia   . Allergic rhinitis   . GERD (gastroesophageal reflux disease)   . Left knee DJD   . Anxiety   . Migraine   . Diverticulosis of colon   . Seizure disorder   . Temporal arteritis     Right eye blind, on steroids per [facility]/ Dr [name]  . CVA (cerebral infarction) 10/2007    Right thalamic   . PMR (polymyalgia rheumatica)   . Anemia   . Gait disorder   . Peripheral neuropathy   . HTN (hypertension)   . Type II or unspecified type diabetes mellitus without mention of complication, not stated as uncontrolled     2nd to steriods  . Renal insufficiency   . Previous back surgery 10/09/12  . Polyneuropathy in other  diseases classified elsewhere 02/17/2013   Past Surgical History  Procedure Laterality Date  . Mastectomy    . Cholecystectomy    . Total knee arthroplasty      Left  . Tonsillectomy    . Cataract extraction      OS - Summer 2010  . Lumbar fusion  04/2010    W/Mechanical fixation - [facility]  . Spinal fusion  07/2010    T10-L2 interbody fusion / [facility]  . Colonoscopy  12/15/2011    Procedure: COLONOSCOPY;  Surgeon: [name], MD;  Location: [facility];  Service: Endoscopy;  Laterality: N/A;  . Carpal tunnel release Right   . Cervical fusion  2010    4 rods and pins in place  . Lumbar fusion  2013    rods in hips (to stabilize).    reports that she has never smoked. She has never used smokeless tobacco. She reports that she does not drink alcohol or use illicit drugs. family history includes Brain cancer in her mother; Breast cancer in her sister; Dementia in her father; Hypertension in her father and mother; Multiple sclerosis in her sister. Allergies  Allergen Reactions  . Hydromorphone Other (See Comments)    Cognitive changes      Review of Systems  Constitutional: Negative for fever,  appetite change, fatigue and unexpected weight change.  Respiratory: Negative for cough and shortness of breath.   Cardiovascular: Negative for chest pain.  Gastrointestinal: Negative for vomiting and diarrhea.  Musculoskeletal: Positive for back pain.  Neurological: Negative for dizziness.  Hematological: Negative for adenopathy.  Psychiatric/Behavioral: Negative for confusion.       Objective:   Physical Exam  Constitutional: She appears well-developed and well-nourished. No distress.  HENT:  Mouth/Throat: Oropharynx is clear and moist.  Neck: Neck supple.  Cardiovascular: Normal rate and regular rhythm.   Pulmonary/Chest: Breath sounds normal. No respiratory distress. She has no wheezes. She has no rales.  Abdominal: Soft. Bowel sounds are normal. She  exhibits no distension and no mass. There is no tenderness. There is no rebound and no guarding.  Musculoskeletal: She exhibits no edema.          Assessment & Plan:   #1 odynophagia. Question reflux related. She does take doxycycline from her dentist and we've strongly recommended she take this with a full glass of fluid and avoid lying supine afterwards. Reflux precautions given. Elevate head of bed 6-8 inches. Start Protonix 40 mg once daily. If pain not resolving in 2-3 weeks consider GI referral for possible endoscopy. She has had some mild weight loss but due to her efforts. She has good appetite  Will need EGD if not improving soon.    #2 chronic back pain.  Refill  OxyContin for 3 months

## 2015-04-07 NOTE — Progress Notes (Signed)
Pre visit review using our clinic review tool, if applicable. No additional management support is needed unless otherwise documented below in the visit note. 

## 2015-04-08 ENCOUNTER — Ambulatory Visit: Payer: Medicare Other

## 2015-04-28 DIAGNOSIS — Z961 Presence of intraocular lens: Secondary | ICD-10-CM | POA: Diagnosis not present

## 2015-04-28 DIAGNOSIS — H5310 Unspecified subjective visual disturbances: Secondary | ICD-10-CM | POA: Diagnosis not present

## 2015-04-28 DIAGNOSIS — H4720 Unspecified optic atrophy: Secondary | ICD-10-CM | POA: Diagnosis not present

## 2015-04-28 DIAGNOSIS — H43813 Vitreous degeneration, bilateral: Secondary | ICD-10-CM | POA: Diagnosis not present

## 2015-05-03 DIAGNOSIS — M0589 Other rheumatoid arthritis with rheumatoid factor of multiple sites: Secondary | ICD-10-CM | POA: Diagnosis not present

## 2015-05-04 ENCOUNTER — Telehealth: Payer: Self-pay | Admitting: Family Medicine

## 2015-05-04 DIAGNOSIS — M810 Age-related osteoporosis without current pathological fracture: Secondary | ICD-10-CM | POA: Diagnosis not present

## 2015-05-04 DIAGNOSIS — N184 Chronic kidney disease, stage 4 (severe): Secondary | ICD-10-CM | POA: Diagnosis not present

## 2015-05-04 DIAGNOSIS — M5136 Other intervertebral disc degeneration, lumbar region: Secondary | ICD-10-CM | POA: Diagnosis not present

## 2015-05-04 DIAGNOSIS — M150 Primary generalized (osteo)arthritis: Secondary | ICD-10-CM | POA: Diagnosis not present

## 2015-05-04 DIAGNOSIS — M0589 Other rheumatoid arthritis with rheumatoid factor of multiple sites: Secondary | ICD-10-CM | POA: Diagnosis not present

## 2015-05-04 DIAGNOSIS — M316 Other giant cell arteritis: Secondary | ICD-10-CM | POA: Diagnosis not present

## 2015-05-04 NOTE — Telephone Encounter (Signed)
Please clarify.  She received 3 one month prescription at her visit in July.

## 2015-05-04 NOTE — Telephone Encounter (Signed)
Pt request refill °Oxycodone HCl 10 MG TABS °

## 2015-05-04 NOTE — Telephone Encounter (Signed)
Last visit 04/07/15 Last refill 10/07/14 #180 0 refill

## 2015-05-05 MED ORDER — OXYCODONE HCL 10 MG PO TABS: 10.0000 mg | ORAL_TABLET | ORAL | Status: DC | PRN

## 2015-05-05 NOTE — Telephone Encounter (Signed)
Pt is aware that Rx is ready for pickup  

## 2015-05-05 NOTE — Telephone Encounter (Signed)
Thanks.  Refill once.

## 2015-05-05 NOTE — Telephone Encounter (Signed)
This medication is the Oxycodone for her breakthrough pain. Was refilled on 10/07/14

## 2015-05-10 DIAGNOSIS — M0630 Rheumatoid nodule, unspecified site: Secondary | ICD-10-CM | POA: Diagnosis not present

## 2015-05-24 DIAGNOSIS — Z853 Personal history of malignant neoplasm of breast: Secondary | ICD-10-CM | POA: Diagnosis not present

## 2015-05-24 DIAGNOSIS — Z1231 Encounter for screening mammogram for malignant neoplasm of breast: Secondary | ICD-10-CM | POA: Diagnosis not present

## 2015-05-27 ENCOUNTER — Other Ambulatory Visit: Payer: Self-pay | Admitting: Adult Health

## 2015-06-01 ENCOUNTER — Other Ambulatory Visit: Payer: Self-pay | Admitting: Orthopedic Surgery

## 2015-06-01 DIAGNOSIS — M7989 Other specified soft tissue disorders: Secondary | ICD-10-CM | POA: Diagnosis not present

## 2015-06-01 DIAGNOSIS — D2111 Benign neoplasm of connective and other soft tissue of right upper limb, including shoulder: Secondary | ICD-10-CM | POA: Diagnosis not present

## 2015-06-03 DIAGNOSIS — Z4789 Encounter for other orthopedic aftercare: Secondary | ICD-10-CM | POA: Diagnosis not present

## 2015-06-06 DIAGNOSIS — D481 Neoplasm of uncertain behavior of connective and other soft tissue: Secondary | ICD-10-CM | POA: Diagnosis not present

## 2015-06-06 DIAGNOSIS — M810 Age-related osteoporosis without current pathological fracture: Secondary | ICD-10-CM | POA: Diagnosis not present

## 2015-06-07 DIAGNOSIS — Z4789 Encounter for other orthopedic aftercare: Secondary | ICD-10-CM | POA: Diagnosis not present

## 2015-06-07 DIAGNOSIS — S01319A Laceration without foreign body of unspecified ear, initial encounter: Secondary | ICD-10-CM | POA: Insufficient documentation

## 2015-06-13 ENCOUNTER — Ambulatory Visit (INDEPENDENT_AMBULATORY_CARE_PROVIDER_SITE_OTHER): Payer: Medicare Other | Admitting: Family Medicine

## 2015-06-13 VITALS — BP 140/84 | HR 82 | Temp 98.1°F | Ht 61.25 in | Wt 163.8 lb

## 2015-06-13 DIAGNOSIS — R202 Paresthesia of skin: Secondary | ICD-10-CM | POA: Diagnosis not present

## 2015-06-13 DIAGNOSIS — R209 Unspecified disturbances of skin sensation: Secondary | ICD-10-CM | POA: Diagnosis not present

## 2015-06-13 DIAGNOSIS — Z4789 Encounter for other orthopedic aftercare: Secondary | ICD-10-CM | POA: Diagnosis not present

## 2015-06-13 DIAGNOSIS — R739 Hyperglycemia, unspecified: Secondary | ICD-10-CM | POA: Diagnosis not present

## 2015-06-13 DIAGNOSIS — R200 Anesthesia of skin: Secondary | ICD-10-CM

## 2015-06-13 DIAGNOSIS — R208 Other disturbances of skin sensation: Secondary | ICD-10-CM

## 2015-06-13 LAB — TSH: TSH: 1.32 u[IU]/mL (ref 0.35–4.50)

## 2015-06-13 LAB — SEDIMENTATION RATE: SED RATE: 51 mm/h — AB (ref 0–22)

## 2015-06-13 LAB — HEMOGLOBIN A1C: Hgb A1c MFr Bld: 5.6 % (ref 4.6–6.5)

## 2015-06-13 LAB — VITAMIN B12: Vitamin B-12: 709 pg/mL (ref 211–911)

## 2015-06-13 NOTE — Progress Notes (Signed)
Pre visit review using our clinic review tool, if applicable. No additional management support is needed unless otherwise documented below in the visit note. 

## 2015-06-13 NOTE — Patient Instructions (Signed)
We will call you with lab results Follow up immediately for any weakness or progressive numbness.

## 2015-06-13 NOTE — Progress Notes (Signed)
Subjective:    Patient ID: [name], female    DOB: 02/01/1941, 74 y.o.   MRN: [id number]  HPI Patient seen with one-week history of bilateral hand "numbness ". She states this involves her whole hand and is constant. She denies any hand or wrist pain. No arm involvement. No recent increased neck pain. No focal weakness. She does have history of mild hyperglycemia but A1c's have been consistently stable.  Chronic problems include history of hypertension, hyperlipidemia, osteoporosis, chronic back pain, temporal arteritis, chronic kidney disease, and polyneuropathy involving her lower extremities.  She denies any focal weakness. No headaches. She does relate prior history of carpal tunnel syndrome but those symptoms were different  Past Medical History  Diagnosis Date  . History of breast cancer   . Osteoporosis   . Idiopathic pulmonary fibrosis   . Bronchiectasis   . Vitamin D deficiency   . Hyperlipidemia   . Allergic rhinitis   . GERD (gastroesophageal reflux disease)   . Left knee DJD   . Anxiety   . Migraine   . Diverticulosis of colon   . Seizure disorder   . Temporal arteritis     Right eye blind, on steroids per [facility]/ Dr [name]  . CVA (cerebral infarction) 10/2007    Right thalamic   . PMR (polymyalgia rheumatica)   . Anemia   . Gait disorder   . Peripheral neuropathy   . HTN (hypertension)   . Type II or unspecified type diabetes mellitus without mention of complication, not stated as uncontrolled     2nd to steriods  . Renal insufficiency   . Previous back surgery 10/09/12  . Polyneuropathy in other diseases classified elsewhere 02/17/2013   Past Surgical History  Procedure Laterality Date  . Mastectomy    . Cholecystectomy    . Total knee arthroplasty      Left  . Tonsillectomy    . Cataract extraction      OS - Summer 2010  . Lumbar fusion  04/2010    W/Mechanical fixation - [facility]  . Spinal fusion  07/2010    T10-L2 interbody  fusion / [facility]  . Colonoscopy  12/15/2011    Procedure: COLONOSCOPY;  Surgeon: [name], MD;  [facility];  Service: Endoscopy;  Laterality: N/A;  . Carpal tunnel release Right   . Cervical fusion  2010    4 rods and pins in place  . Lumbar fusion  2013    rods in hips (to stabilize).    reports that she has never smoked. She has never used smokeless tobacco. She reports that she does not drink alcohol or use illicit drugs. family history includes Brain cancer in her mother; Breast cancer in her sister; Dementia in her father; Hypertension in her father and mother; Multiple sclerosis in her sister. Allergies  Allergen Reactions  . Hydromorphone Other (See Comments)    Cognitive changes       Review of Systems  Constitutional: Negative for appetite change and unexpected weight change.  Respiratory: Negative for shortness of breath.   Cardiovascular: Negative for chest pain.  Endocrine: Negative for polydipsia and polyuria.  Neurological: Positive for numbness. Negative for tremors, seizures, weakness and headaches.       Objective:   Physical Exam  Constitutional: She appears well-developed and well-nourished. No distress.  Cardiovascular: Normal rate and regular rhythm.   Pulmonary/Chest: Effort normal and breath sounds normal. No respiratory distress. She has no wheezes. She has  no rales.  Musculoskeletal: She exhibits no edema.  Neurological:  Full grip strengths bilaterally. No focal upper extending weakness. She has symmetric reflexes upper extremities. Subjectively decreased sensory function to touch but with monofilament testing she was able to sense throughout both hands          Assessment & Plan:  Paresthesias involving both hands. Relatively acute onset. No focal weakness. Doubt carpal tunnel -given distribution of numbness and she has no associated pain. Doubt metabolic but check TSH, 123456, sedimentation rate, serum protein electrophoresis.  Consider cervical MRI if the above normal..

## 2015-06-15 LAB — PROTEIN ELECTROPHORESIS, SERUM
ALBUMIN ELP: 3.6 g/dL — AB (ref 3.8–4.8)
ALPHA-1-GLOBULIN: 0.4 g/dL — AB (ref 0.2–0.3)
Alpha-2-Globulin: 1 g/dL — ABNORMAL HIGH (ref 0.5–0.9)
Beta 2: 0.4 g/dL (ref 0.2–0.5)
Beta Globulin: 0.4 g/dL (ref 0.4–0.6)
Gamma Globulin: 1.1 g/dL (ref 0.8–1.7)
TOTAL PROTEIN, SERUM ELECTROPHOR: 6.8 g/dL (ref 6.1–8.1)

## 2015-06-20 ENCOUNTER — Telehealth: Payer: Self-pay | Admitting: Adult Health

## 2015-06-20 ENCOUNTER — Telehealth: Payer: Self-pay | Admitting: Family Medicine

## 2015-06-20 DIAGNOSIS — Q761 Klippel-Feil syndrome: Secondary | ICD-10-CM

## 2015-06-20 NOTE — Telephone Encounter (Signed)
Patient called requesting to speak to nurse practitioner. Sed rate was elevated to 51 at recent office visit with Dr. Elease Hashimoto. Patient states she hasn't been feeling good and wonders if she needs to increase her prednisone. Please call patient back [phone].

## 2015-06-20 NOTE — Telephone Encounter (Signed)
Spoke with patient regarding lab results and recommendations for  MRI. Pt states that she has metal rods and pins in her neck and back  // wants to know if its still okay to have the MRI ?

## 2015-06-20 NOTE — Telephone Encounter (Signed)
Attempted to call patient no answer, no answering machine.

## 2015-06-20 NOTE — Telephone Encounter (Signed)
Patient want's to make [name] aware about Sed rate and see if she want's to leave prednisone as is or increase.

## 2015-06-20 NOTE — Telephone Encounter (Signed)
Pt return your call .Would like a call back

## 2015-06-20 NOTE — Telephone Encounter (Signed)
i forgot that she had prior cervical fusion.  If bilateral hand numbness persists, I recommend that we go ahead and get neurology consult.

## 2015-06-21 ENCOUNTER — Telehealth: Payer: Self-pay | Admitting: Neurology

## 2015-06-21 ENCOUNTER — Encounter: Payer: Self-pay | Admitting: Family Medicine

## 2015-06-21 NOTE — Telephone Encounter (Signed)
Patient called stating she was having numbness in bil hands gradual onset increasing the past 3 months. She has dx of polyneuropathy and Dr [name] has seen her for neuropathy in lower ext. If she can be seen without referral for this condition the RN will have to work her in as there are no OV available. Please call and advise. Patient can be reached at [phone].

## 2015-06-21 NOTE — Progress Notes (Signed)
Called the pt and sent the referral

## 2015-06-21 NOTE — Addendum Note (Signed)
Addended by: Ailene Rud E on: 06/21/2015 11:13 AM   Modules accepted: Orders

## 2015-06-21 NOTE — Telephone Encounter (Signed)
Order for referal placed

## 2015-06-21 NOTE — Telephone Encounter (Signed)
Called patient earlier this morning she had not had any visual symptoms and only rare headache in the last 3 months. Sed rate had been stable for the last year then suddenly went to 13 from 8 in June. Dr. [name] recommends MRI of the brain and repeat sed rate in 2 months. Called patient back and she says she has an appt with Dr. [name] tomorrrow 8 am.

## 2015-06-21 NOTE — Telephone Encounter (Signed)
I called the patient. She has numbness in both of her hands similar to the numbness in her feet. This has been progressing for the past 3 months. Appointment scheduled 9/28.

## 2015-06-21 NOTE — Telephone Encounter (Signed)
Pt stated that she has an appt with the neurologist tomorrow 06/22/15 at 8:00

## 2015-06-21 NOTE — Telephone Encounter (Signed)
Made Dr. [name] aware of am appt. He will evaluate before ordering any tests.

## 2015-06-22 ENCOUNTER — Ambulatory Visit (INDEPENDENT_AMBULATORY_CARE_PROVIDER_SITE_OTHER): Payer: Medicare Other | Admitting: Neurology

## 2015-06-22 ENCOUNTER — Encounter: Payer: Self-pay | Admitting: Neurology

## 2015-06-22 VITALS — BP 137/82 | HR 80 | Ht 62.0 in | Wt 162.5 lb

## 2015-06-22 DIAGNOSIS — M549 Dorsalgia, unspecified: Secondary | ICD-10-CM | POA: Diagnosis not present

## 2015-06-22 DIAGNOSIS — M316 Other giant cell arteritis: Secondary | ICD-10-CM

## 2015-06-22 DIAGNOSIS — G8929 Other chronic pain: Secondary | ICD-10-CM | POA: Diagnosis not present

## 2015-06-22 DIAGNOSIS — G63 Polyneuropathy in diseases classified elsewhere: Secondary | ICD-10-CM

## 2015-06-22 NOTE — Patient Instructions (Addendum)
We will set up EMG and NCV evaluation to look at the nerves in the arms and legs, and go up on the prednisone to 10 mg daily. MRI of the cervical spine will be done.  Temporal Arteritis Arteries are blood vessels that carry blood from the heart to all parts of the body. Temporal arteritis is a swelling (inflammation) of certain large arteries. This usually affects arteries in the head and neck area, including arteries in the area on the side of the head, between the ears and eyes (temples). The condition can be very painful. It also can cause serious problems, even blindness. Early diagnosis and treatment is very important. CAUSES  Temporal arteritis results from the body reacting to injury or infection (inflammation). This may occur when the body's immune system (which fights germs and disease) makes a mistake. It attacks its own arteries. No one knows why this happens. However, certain things (risk factors) make it more likely that a person will develop temporal arteritis. They include:  Age. Most people with temporal arteritis are older than 50. The average age is 23.  Sex. Three times more women than men develop the condition.  Race and ethnic background. Caucasians are more likely to have temporal arteritis than other races. So are people whose families came from [place] or [place]  Having polymyalgia rheumatica (PMR). This condition causes stiffness and pain in the joints of the neck, shoulders and hips. About 15% of people with PMR also have temporal arteritis. SYMPTOMS  Not everyone with temporal arteritis has the same symptoms. Some people have just one symptom. Others may have several. The most common symptom is a new headache, often in the temple region. Symptoms may show up in other parts of the body too.   Symptoms affecting the head may include:  Temporal arteries that feel hard or swollen. It may hurt when the temples are touched.  Pain  when combing your hair, or when laying your head on a pillow.  Pain in the jaw when chewing.  Pain in the throat or tongue.  Visual problems, including sudden loss of vision in one eye, or seeing double.  Symptoms in other parts of the body may include:  Fever.  Fatigue.  A dry cough.  Pain in the hips and shoulders.  Pain in the arms during exercise.  Depression.  Weight loss. DIAGNOSIS  Symptoms of temporal arteritis are similar to symptoms for other conditions. This can make it hard to tell if you have the condition. To be sure, your caregiver will ask about your symptoms and do a physical exam. Certain tests may be necessary, such as:   An exam of your temples. Often, the temporal arteries will be swollen and hard. This can be felt.  A complete blood count. This test shows how many red blood cells are in your blood. Most people with temporal arteritis do not have enough red blood cells (anemic).  Erythrocyte sedimentation (also called sed rate test). It measures inflammation in the body. Almost everyone with temporal arteritis has a high sed rate.  C reactive protein test. This also shows if there is inflammation.  Biopsy a temporal artery. This means the caregiver will take out a small piece of an artery. Then, it is checked under a microscope for inflammation. More than one biopsy may be needed. That is because inflammation can be in one part of an artery and not in others. Your caregiver may need to check more than  one spot. TREATMENT  Starting treatment right away is very important. Often, you will need to see a specialist in immunologic diseases (rheumatologist). Goals of treatment include protecting your eyesight. Once vision is gone, it might not come back. The normal treatment is medication. It usually works well and quickly. Most people start getting better in a few days. Medication options include:  Corticosteroids. These are powerful drugs that fight inflammation.  These drugs are most often used to treat temporal arteritis.  Usually, a high dose is taken at first. After symptoms improve, a smaller dose is used. The goal is to take the smallest dose possible and still control your symptoms. That is because using corticosteroids for a long time can cause problems. They can make muscles and bones weak. They can cause blood pressure to go up, and cause diabetes. Also, people often gain weight when they take corticosteroids. Corticosteroids may need to be taken for one or two years.  Several newer drugs are being tested to treat temporal arteritis. Researchers are testing to see if new drugs will work as well as corticosteroids, but cause fewer problems than them. Testing of these drugs is not yet complete.  Some specialists recommend low dose aspirin to prevent blood clots. HOME CARE INSTRUCTIONS   Take any corticosteroids that your caregiver prescribes. Follow the directions carefully.  Take any vitamins or supplements that your caregiver suggests. This may include vitamin D and calcium. They help keep your bones from becoming weak.  Keep all appointments for checkups. Your caregiver will watch for any problems from the medication. Checkups may include:  Periodic blood tests.  Bone density testing. This checks how strong or weak your bones are.  Blood pressure checks. If your blood pressure rises, you may need to take a drug to control it while you are taking corticosteroids.  Blood sugar checks. This is to be sure you are not developing diabetes. If you have diabetes, corticosteroid medications may make it worse and require increased treatment.  Exercise. First, talk with your caregiver about what would be OK for you to do. Aerobic exercise (which increases your heart rate) is usually suggested. It does not have to require a lot of energy. Walking is aerobic exercise. This type of exercise is good because it helps prevent bone loss. It also helps control  your blood pressure.  Follow a healthy diet. The goal is to prevent bone damage and diabetes. Include good sources of protein in your diet. Also, include fruits, vegetables and whole grains. Your caregiver can refer you to an expert on healthy eating (dietitian) for more advice. SEEK MEDICAL CARE IF:   The symptoms that lead to your diagnosis return.  You develop worsening fever, fatigue, headache, weight loss, or pain in your jaw.  You develop signs of infection. Infections can be worse if you are on corticosteroid medication. SEEK IMMEDIATE MEDICAL CARE IF:   Your eyesight changes.  Pain does not go away, even after taking pain medicine.  You feel pain in your chest.  Breathing is difficult.  One side of your face or body suddenly becomes weak or numb.  You develop a fever of more than 102 F (38.9 C). Document Released: 07/08/2009 Document Revised: 12/03/2011 Document Reviewed: 11/04/2013 [facility] Patient Information 2015 [place], [place]. This information is not intended to replace advice given to you by your health care provider. Make sure you discuss any questions you have with your health care provider.

## 2015-06-22 NOTE — Progress Notes (Signed)
Reason for visit: Temporal arteritis  [name] is an 74 y.o. female  History of present illness:  [name] is a 74 year old right-handed white female with a history of temporal arteritis. The patient has had loss of vision involving the right eye. The patient has had prior lumbosacral spine surgery with a residual left foot drop, and a history of a peripheral neuropathy. The patient has a chronic gait disorder. She has had prior carpal tunnel release, and lower thoracic spine surgery with fusion. Within the last 3 weeks, the patient has had a significant decline in her functional level. She has noted onset of numbness in the hands with electric shock sensations into the left shoulder area. The patient is having a generalized increase in fatigue. The sedimentation rate 3 months ago was 8, most recent sedimentation rate done several days ago was 51. The patient does have occasional headaches, no significant increase in headaches have been noted recently, and no visual field changes have been noted. The patient also reports some increased difficulty with walking, increased numbness in the legs. She denies any changes in bowel or bladder control. She comes to this office for an evaluation. He has been seen by her primary care physician who has ordered MRI evaluation of the cervical spine, this has not yet been done.  Past Medical History  Diagnosis Date  . History of breast cancer   . Osteoporosis   . Idiopathic pulmonary fibrosis   . Bronchiectasis   . Vitamin D deficiency   . Hyperlipidemia   . Allergic rhinitis   . GERD (gastroesophageal reflux disease)   . Left knee DJD   . Anxiety   . Migraine   . Diverticulosis of colon   . Seizure disorder   . Temporal arteritis     Right eye blind, on steroids per [facility]/ Dr [name]  . CVA (cerebral infarction) 10/2007    Right thalamic   . PMR (polymyalgia rheumatica)   . Anemia   . Gait disorder   . Peripheral neuropathy   . HTN  (hypertension)   . Type II or unspecified type diabetes mellitus without mention of complication, not stated as uncontrolled     2nd to steriods  . Renal insufficiency   . Previous back surgery 10/09/12  . Polyneuropathy in other diseases classified elsewhere 02/17/2013    Past Surgical History  Procedure Laterality Date  . Mastectomy    . Cholecystectomy    . Total knee arthroplasty      Left  . Tonsillectomy    . Cataract extraction      OS - Summer 2010  . Lumbar fusion  04/2010    W/Mechanical fixation - [facility]  . Spinal fusion  07/2010    T10-L2 interbody fusion / [facility]  . Colonoscopy  12/15/2011    Procedure: COLONOSCOPY;  Surgeon: [name], MD;  Location: [facility];  Service: Endoscopy;  Laterality: N/A;  . Carpal tunnel release Right   . Cervical fusion  2010    4 rods and pins in place  . Lumbar fusion  2013    rods in hips (to stabilize).  . Rheumatoid nodule removal      Family History  Problem Relation Age of Onset  . Brain cancer Mother   . Hypertension Mother   . Dementia Father   . Hypertension Father   . Breast cancer Sister   . Multiple sclerosis Sister     Social history:  reports  that she has never smoked. She has never used smokeless tobacco. She reports that she does not drink alcohol or use illicit drugs.    Allergies  Allergen Reactions  . Hydromorphone Other (See Comments)    Cognitive changes     Medications:  Prior to Admission medications   Medication Sig Start Date End Date Taking? Authorizing Provider  amLODipine (NORVASC) 5 MG tablet TAKE 1 TABLET DAILY 12/23/14  Yes Eulas Post, MD  calcium carbonate (OS-CAL) 600 MG TABS Take 600 mg by mouth daily.    Yes Historical Provider, MD  cholecalciferol (VITAMIN D) 1000 UNITS tablet Take 1,000 Units by mouth daily.     Yes Historical Provider, MD  denosumab (PROLIA) 60 MG/ML SOLN Inject 60 mg into the skin every 6 (six) months.     Yes Historical  Provider, MD  doxycycline (VIBRA-TABS) 100 MG tablet  02/15/15  Yes Historical Provider, MD  ferrous sulfate 325 (65 FE) MG tablet Take 325 mg by mouth 2 (two) times daily.    Yes Historical Provider, MD  furosemide (LASIX) 40 MG tablet TAKE 1 TABLET DAILY 12/07/14  Yes Eulas Post, MD  gabapentin (NEURONTIN) 300 MG capsule TAKE 1 CAPSULE IN THE MORNNG, 1 CAPSULE AT NOON, 1 WITH DINNER AND 2 BEFORE BEDTIME Patient taking differently: Taking 2 caps in am and 2 caps in pm. 12/25/14  Yes Kathrynn Ducking, MD  Golimumab [facility] ARIA IV) Inject into the vein. Takes infusion every 8 wks.   Yes Historical Provider, MD  Multiple Vitamins-Minerals (MULTIVITAMIN,TX-MINERALS) tablet Take 1 tablet by mouth daily.     Yes Historical Provider, MD  OxyCODONE (OXYCONTIN) 10 mg T12A 12 hr tablet May refill on or after April 07, 2015 04/07/15  Yes Eulas Post, MD  OxyCODONE (OXYCONTIN) 10 mg T12A 12 hr tablet May fill on or after August 14,2016 04/07/15  Yes Eulas Post, MD  OxyCODONE (OXYCONTIN) 10 mg T12A 12 hr tablet Fill on or before June 08, 2015 04/07/15  Yes Eulas Post, MD  Oxycodone HCl 10 MG TABS Take 1 tablet (10 mg total) by mouth every 4 (four) hours as needed (breakthrough pain.). Breakthrough pain. 05/05/15  Yes Eulas Post, MD  potassium chloride (K-DUR,KLOR-CON) 10 MEQ tablet Take 10 mEq by mouth daily. 03/02/13  Yes Neena Rhymes, MD  predniSONE (DELTASONE) 1 MG tablet Take 1 tablet daily with breakfast along with 5mg  tablet 05/27/15  Yes Ward Givens, NP  predniSONE (DELTASONE) 5 MG tablet TAKE 1 TABLET DAILY WITH BREAKFAST (ALONG WITH 1 MG TABLETS) 05/27/15  Yes Ward Givens, NP  simvastatin (ZOCOR) 5 MG tablet TAKE 1 TABLET AT BEDTIME 01/24/15  Yes Eulas Post, MD    ROS:  Out of a complete 14 system review of symptoms, the patient complains only of the following symptoms, and all other reviewed systems are negative.  Numbness on all fours Weakness Gait  disturbance  Blood pressure 137/82, pulse 80, height 5\' 2"  (1.575 m), weight 162 lb 8 oz (73.71 kg).  Physical Exam  General: The patient is alert and cooperative at the time of the examination.  Skin: 1+ edema of ankles is noted bilaterally..   Neurologic Exam  Mental status: The patient is alert and oriented x 3 at the time of the examination. The patient has apparent normal recent and remote memory, with an apparently normal attention span and concentration ability.   Cranial nerves: Facial symmetry is present. Speech is normal, no aphasia or  dysarthria is noted. Extraocular movements are full, but there is some exotropia of the right eye on primary gaze. Visual fields are full with the left eye, the patient is blind in the right eye.  Motor: The patient has good strength in all 4 extremities, with exception that there is a left-sided foot drop.  Sensory examination: Soft touch sensation is symmetric on the face, arms, and legs.  Coordination: The patient has good finger-nose-finger and heel-to-shin bilaterally.  Gait and station: The patient has a wide-based, unsteady gait. The patient usually uses a walker for ambulation. Tandem gait was not attempted. Romberg is unsteady, the patient has a tendency to fall forward.  Reflexes: Deep tendon reflexes are symmetric, with exception that the left ankle jerk reflex is absent. Otherwise, the reflexes are well-maintained throughout.   Assessment/Plan:  1. Temporal arteritis  2. Chronic gait disturbance  3. History of peripheral neuropathy  4. Lumbosacral spine surgery, residual left foot drop  5. Recent onset of numbness on all 4 extremities  The patient has had a decline overall in her functional level, increased fatigue, elevation of the sedimentation rate, increased numbness in the legs, onset of numbness in the hands. The patient does report some left shoulder discomfort, the patient could have spinal cord compression. MRI  the cervical spine has been ordered. The prednisone will be increased to 10 mg daily, a sedimentation rate will be rechecked in several weeks. The patient will undergo nerve conduction studies of both arms and the left leg, and EMG of the left arm. We may consider further blood work evaluation in the near future.  Jill Alexanders MD 06/22/2015 7:50 PM  [facility] [address] [place], [place] [address]  Phone [phone] Fax [phone]

## 2015-06-27 DIAGNOSIS — M0589 Other rheumatoid arthritis with rheumatoid factor of multiple sites: Secondary | ICD-10-CM | POA: Diagnosis not present

## 2015-07-05 ENCOUNTER — Encounter: Payer: Self-pay | Admitting: Family Medicine

## 2015-07-05 ENCOUNTER — Telehealth: Payer: Self-pay | Admitting: Family Medicine

## 2015-07-05 MED ORDER — OXYCODONE HCL ER 10 MG PO T12A: EXTENDED_RELEASE_TABLET | ORAL | Status: DC

## 2015-07-05 MED ORDER — OXYCODONE HCL 10 MG PO TABS: 10.0000 mg | ORAL_TABLET | ORAL | Status: DC | PRN

## 2015-07-05 NOTE — Telephone Encounter (Signed)
Refills OK. 

## 2015-07-05 NOTE — Telephone Encounter (Signed)
Pt last visit 06/13/15. Last Rx refill 04/07/15 with 3 refills. Please advise

## 2015-07-05 NOTE — Telephone Encounter (Signed)
Pt request refill OXYCONTIN 10 MG TABLET PO T 12A 3 mo supply

## 2015-07-06 NOTE — Telephone Encounter (Signed)
Rx is ready for pick up. Left a voicemail to the pt

## 2015-07-09 ENCOUNTER — Ambulatory Visit
Admission: RE | Admit: 2015-07-09 | Discharge: 2015-07-09 | Disposition: A | Discharge status: Home / Self Care | Payer: Medicare Other | Source: Ambulatory Visit | Attending: Family Medicine | Admitting: Family Medicine

## 2015-07-09 DIAGNOSIS — R202 Paresthesia of skin: Secondary | ICD-10-CM

## 2015-07-09 DIAGNOSIS — R200 Anesthesia of skin: Secondary | ICD-10-CM

## 2015-07-09 DIAGNOSIS — M4802 Spinal stenosis, cervical region: Secondary | ICD-10-CM | POA: Diagnosis not present

## 2015-07-13 ENCOUNTER — Ambulatory Visit (INDEPENDENT_AMBULATORY_CARE_PROVIDER_SITE_OTHER): Payer: Medicare Other | Admitting: Neurology

## 2015-07-13 ENCOUNTER — Encounter: Payer: Self-pay | Admitting: Neurology

## 2015-07-13 ENCOUNTER — Ambulatory Visit (INDEPENDENT_AMBULATORY_CARE_PROVIDER_SITE_OTHER): Payer: Self-pay | Admitting: Neurology

## 2015-07-13 ENCOUNTER — Telehealth: Payer: Self-pay

## 2015-07-13 DIAGNOSIS — G5602 Carpal tunnel syndrome, left upper limb: Secondary | ICD-10-CM

## 2015-07-13 DIAGNOSIS — G63 Polyneuropathy in diseases classified elsewhere: Secondary | ICD-10-CM | POA: Diagnosis not present

## 2015-07-13 DIAGNOSIS — M316 Other giant cell arteritis: Secondary | ICD-10-CM

## 2015-07-13 DIAGNOSIS — G8929 Other chronic pain: Secondary | ICD-10-CM

## 2015-07-13 DIAGNOSIS — M549 Dorsalgia, unspecified: Secondary | ICD-10-CM

## 2015-07-13 DIAGNOSIS — G5601 Carpal tunnel syndrome, right upper limb: Secondary | ICD-10-CM

## 2015-07-13 DIAGNOSIS — G5603 Carpal tunnel syndrome, bilateral upper limbs: Secondary | ICD-10-CM

## 2015-07-13 DIAGNOSIS — Z5181 Encounter for therapeutic drug level monitoring: Secondary | ICD-10-CM | POA: Diagnosis not present

## 2015-07-13 DIAGNOSIS — G5600 Carpal tunnel syndrome, unspecified upper limb: Secondary | ICD-10-CM

## 2015-07-13 DIAGNOSIS — M4712 Other spondylosis with myelopathy, cervical region: Secondary | ICD-10-CM

## 2015-07-13 HISTORY — DX: Carpal tunnel syndrome, unspecified upper limb: G56.00

## 2015-07-13 NOTE — Progress Notes (Signed)
[name] is a 74 year old patient with a history of temporal arteritis and rheumatoid arthritis and a peripheral neuropathy. The patient has had cervical and lumbar spine surgery previously. The patient comes in with increasing symptoms of paresthesias across the shoulder, down the left arm, and a progressive gait disorder.  MRI of the cervical spine his been done recently and shows evidence of a cervical myelopathy at the C3-4 level. The patient believes that her walking has changed just in the last 1 week. The patient is using a walker for and relation, she reports no falls.  Nerve conduction study does show a moderate to severe axonal peripheral neuropathy, mild bilateral carpal tunnel syndrome. EMG of the left arm shows mild chronic changes in the C6 and C7 levels, no acute changes are seen.  MRI cervical spine 07/10/2015:  IMPRESSION: ACDF C4 through C7. Integrity of the fusion would be best evaluated by CT.  Interval development of moderate to severe spinal stenosis at C3-4 with cord hyperintensity. 5 mm anterior slip of C3 on C4 with marked foraminal encroachment bilaterally.   The patient is likely having new symptoms related to the cervical myelopathy. The patient has seen Dr. Harl Bowie from [facility] previously, I will make another urgent referral to him for surgical evaluation. The patient likely will need surgical decompression of the spinal cord compression at the C3-4 level.

## 2015-07-13 NOTE — Progress Notes (Signed)
Please refer to EMG and nerve conduction study procedure note. 

## 2015-07-13 NOTE — Procedures (Signed)
     HISTORY:  [name] is a 74 year old patient with a history of temporal arteritis, and rheumatoid arthritis. The patient has had onset of some deterioration gait and some paresthesias along the shoulder and left arm. The patient is being evaluated for these new symptoms. The patient does have a known peripheral neuropathy.  NERVE CONDUCTION STUDIES:  Nerve conduction studies were performed on both upper extremities. The distal motor latencies for the median nerves were prolonged bilaterally, with a borderline normal motor amplitude on the right, low on the left. The distal motor latencies and motor amplitudes for the ulnar nerves were normal bilaterally. The F wave latencies and nerve conduction velocities for the median and ulnar nerves were normal bilaterally. The sensory latencies for the median nerves were prolonged bilaterally, normal for the ulnar nerves bilaterally.  Nerve conduction studies were performed on both lower extremities. The distal motor latency for the right peroneal nerve was normal with a low motor amplitude. No response was seen on the left peroneal nerve. The distal motor latencies for the posterior tibial nerves were normal bilaterally, with low motor amplitudes for these nerves bilaterally. The nerve conduction velocities for the right peroneal nerve and for the posterior tibial nerves bilaterally were normal. The sural and peroneal sensory latencies were unobtainable bilaterally. The H reflex latencies were unobtainable bilaterally.  EMG STUDIES:  EMG study was performed on the left upper extremity:  The first dorsal interosseous muscle reveals 2 to 4 K units with full recruitment. No fibrillations or positive waves were noted. The abductor pollicis brevis muscle reveals 2 to 4 K units with full recruitment. No fibrillations or positive waves were noted. The extensor indicis proprius muscle reveals 1 to 3 K units with full recruitment. No fibrillations or  positive waves were noted. The pronator teres muscle reveals 2 to 3 K units with full recruitment. No fibrillations or positive waves were noted. Complex repetitive discharges were seen The biceps muscle reveals 1 to 3 K units with slightly decreased recruitment. No fibrillations or positive waves were noted. The triceps muscle reveals 2 to 4 K units with slightly decreased recruitment. No fibrillations or positive waves were noted. Complex repetitive discharges were seen. The anterior deltoid muscle reveals 2 to 3 K units with slightly decreased recruitment. No fibrillations or positive waves were noted. Complex repetitive discharges were seen. The cervical paraspinal muscles were tested at 2 levels. No abnormalities of insertional activity were seen at either level tested. Complex repetitive discharges were seen at the upper level. There was good relaxation.   IMPRESSION:  Nerve conduction studies done on all 4 extremities shows evidence of bilateral carpal tunnel syndrome of mild to moderate severity. Nerve conduction studies in the lower extremities shows evidence of a primarily axonal peripheral neuropathy of moderate to severe severity. EMG evaluation of the left upper extremity shows mild chronic changes consistent with an overlying C6 and C7 radiculopathy. No acute changes were seen.  Jill Alexanders MD 07/13/2015 1:39 PM  [facility] [address] [place], [place] [address]  Phone [phone] Fax [phone]

## 2015-07-13 NOTE — Telephone Encounter (Signed)
I called [facility] office and spoke to [facility]. I asked her to please work this patient in as soon as possible. She asked me to write that on the referral before I faxed it over. I did this and faxed the referral to Dr. Nelly Laurence attention [phone]).

## 2015-07-14 ENCOUNTER — Telehealth: Payer: Self-pay | Admitting: Neurology

## 2015-07-14 LAB — SEDIMENTATION RATE: SED RATE: 2 mm/h (ref 0–40)

## 2015-07-14 NOTE — Telephone Encounter (Signed)
I called patient. The sedimentation rate was 2. We will cut back on the prednisone taking 8 mg daily. The patient was on 10 mg a day. We are trying to get an urgent evaluation through Dr. Harl Bowie at [facility].

## 2015-07-15 ENCOUNTER — Other Ambulatory Visit: Payer: Self-pay | Admitting: Family Medicine

## 2015-07-15 MED ORDER — OXYCODONE HCL ER 10 MG PO T12A: EXTENDED_RELEASE_TABLET | ORAL | Status: DC

## 2015-07-18 NOTE — Telephone Encounter (Signed)
I called the patient and gave her the correct number [phone]). She is going to call [facility] to follow up on appointment and will call back to let us know when she is scheduled.

## 2015-07-18 NOTE — Telephone Encounter (Signed)
Patient called back and said that phone number she was given for Dr. Harl Bowie is a fax number. Please call patient with correct phone number [phone].

## 2015-07-20 NOTE — Telephone Encounter (Signed)
I called the patient. She has an appointment at [facility] this Friday (07-22-15).

## 2015-07-22 DIAGNOSIS — Z981 Arthrodesis status: Secondary | ICD-10-CM | POA: Diagnosis not present

## 2015-07-22 DIAGNOSIS — R269 Unspecified abnormalities of gait and mobility: Secondary | ICD-10-CM | POA: Diagnosis not present

## 2015-07-22 DIAGNOSIS — M4712 Other spondylosis with myelopathy, cervical region: Secondary | ICD-10-CM | POA: Diagnosis not present

## 2015-07-22 DIAGNOSIS — M4802 Spinal stenosis, cervical region: Secondary | ICD-10-CM | POA: Diagnosis not present

## 2015-07-22 DIAGNOSIS — M5030 Other cervical disc degeneration, unspecified cervical region: Secondary | ICD-10-CM | POA: Diagnosis not present

## 2015-07-25 DIAGNOSIS — J8410 Pulmonary fibrosis, unspecified: Secondary | ICD-10-CM | POA: Diagnosis not present

## 2015-07-25 DIAGNOSIS — N183 Chronic kidney disease, stage 3 (moderate): Secondary | ICD-10-CM | POA: Diagnosis not present

## 2015-07-25 DIAGNOSIS — M069 Rheumatoid arthritis, unspecified: Secondary | ICD-10-CM | POA: Diagnosis not present

## 2015-07-25 DIAGNOSIS — I129 Hypertensive chronic kidney disease with stage 1 through stage 4 chronic kidney disease, or unspecified chronic kidney disease: Secondary | ICD-10-CM | POA: Diagnosis not present

## 2015-07-25 DIAGNOSIS — J479 Bronchiectasis, uncomplicated: Secondary | ICD-10-CM | POA: Diagnosis not present

## 2015-07-25 DIAGNOSIS — R600 Localized edema: Secondary | ICD-10-CM | POA: Diagnosis not present

## 2015-07-25 DIAGNOSIS — E669 Obesity, unspecified: Secondary | ICD-10-CM | POA: Diagnosis not present

## 2015-07-25 DIAGNOSIS — E611 Iron deficiency: Secondary | ICD-10-CM | POA: Diagnosis not present

## 2015-07-25 DIAGNOSIS — N179 Acute kidney failure, unspecified: Secondary | ICD-10-CM | POA: Diagnosis not present

## 2015-07-25 DIAGNOSIS — M4802 Spinal stenosis, cervical region: Secondary | ICD-10-CM | POA: Diagnosis not present

## 2015-07-27 ENCOUNTER — Telehealth: Payer: Self-pay | Admitting: Neurology

## 2015-07-27 NOTE — Telephone Encounter (Signed)
Pt called and has a few questions pertaining to a surgery the Dr. [name] has suggested . Please call and advise [phone]

## 2015-07-28 NOTE — Telephone Encounter (Signed)
I called patient. The patient got all of her questions answered earlier. She is going to see Dr. Harl Bowie in the near future.

## 2015-07-28 NOTE — Telephone Encounter (Signed)
I called the patient. She wanted to know if she should stop any medications prior to the surgery and if so what medications. I advised she ask her surgeon about that. She verbalized understanding. She wanted to let Dr. [name] know her surgery is scheduled 08/04/15.

## 2015-08-01 DIAGNOSIS — M0589 Other rheumatoid arthritis with rheumatoid factor of multiple sites: Secondary | ICD-10-CM | POA: Diagnosis not present

## 2015-08-01 DIAGNOSIS — M150 Primary generalized (osteo)arthritis: Secondary | ICD-10-CM | POA: Diagnosis not present

## 2015-08-01 DIAGNOSIS — M5136 Other intervertebral disc degeneration, lumbar region: Secondary | ICD-10-CM | POA: Diagnosis not present

## 2015-08-01 DIAGNOSIS — M316 Other giant cell arteritis: Secondary | ICD-10-CM | POA: Diagnosis not present

## 2015-08-01 DIAGNOSIS — N184 Chronic kidney disease, stage 4 (severe): Secondary | ICD-10-CM | POA: Diagnosis not present

## 2015-08-01 DIAGNOSIS — M810 Age-related osteoporosis without current pathological fracture: Secondary | ICD-10-CM | POA: Diagnosis not present

## 2015-08-03 DIAGNOSIS — I252 Old myocardial infarction: Secondary | ICD-10-CM | POA: Diagnosis not present

## 2015-08-04 DIAGNOSIS — G8929 Other chronic pain: Secondary | ICD-10-CM | POA: Diagnosis present

## 2015-08-04 DIAGNOSIS — M47012 Anterior spinal artery compression syndromes, cervical region: Secondary | ICD-10-CM | POA: Diagnosis not present

## 2015-08-04 DIAGNOSIS — Z7952 Long term (current) use of systemic steroids: Secondary | ICD-10-CM | POA: Diagnosis not present

## 2015-08-04 DIAGNOSIS — M5001 Cervical disc disorder with myelopathy,  high cervical region: Secondary | ICD-10-CM | POA: Diagnosis not present

## 2015-08-04 DIAGNOSIS — G629 Polyneuropathy, unspecified: Secondary | ICD-10-CM | POA: Diagnosis present

## 2015-08-04 DIAGNOSIS — N183 Chronic kidney disease, stage 3 (moderate): Secondary | ICD-10-CM | POA: Diagnosis present

## 2015-08-04 DIAGNOSIS — Z781 Physical restraint status: Secondary | ICD-10-CM | POA: Diagnosis not present

## 2015-08-04 DIAGNOSIS — I129 Hypertensive chronic kidney disease with stage 1 through stage 4 chronic kidney disease, or unspecified chronic kidney disease: Secondary | ICD-10-CM | POA: Diagnosis present

## 2015-08-04 DIAGNOSIS — J479 Bronchiectasis, uncomplicated: Secondary | ICD-10-CM | POA: Diagnosis present

## 2015-08-04 DIAGNOSIS — Z981 Arthrodesis status: Secondary | ICD-10-CM | POA: Diagnosis not present

## 2015-08-04 DIAGNOSIS — M96840 Postprocedural hematoma of a musculoskeletal structure following a musculoskeletal system procedure: Secondary | ICD-10-CM | POA: Diagnosis not present

## 2015-08-04 DIAGNOSIS — E785 Hyperlipidemia, unspecified: Secondary | ICD-10-CM | POA: Diagnosis present

## 2015-08-04 DIAGNOSIS — Z853 Personal history of malignant neoplasm of breast: Secondary | ICD-10-CM | POA: Diagnosis not present

## 2015-08-04 DIAGNOSIS — Z4789 Encounter for other orthopedic aftercare: Secondary | ICD-10-CM | POA: Diagnosis not present

## 2015-08-04 DIAGNOSIS — Z9012 Acquired absence of left breast and nipple: Secondary | ICD-10-CM | POA: Diagnosis not present

## 2015-08-04 DIAGNOSIS — Z4682 Encounter for fitting and adjustment of non-vascular catheter: Secondary | ICD-10-CM | POA: Diagnosis not present

## 2015-08-04 DIAGNOSIS — R1314 Dysphagia, pharyngoesophageal phase: Secondary | ICD-10-CM | POA: Diagnosis not present

## 2015-08-04 DIAGNOSIS — D509 Iron deficiency anemia, unspecified: Secondary | ICD-10-CM | POA: Diagnosis present

## 2015-08-04 DIAGNOSIS — L7632 Postprocedural hematoma of skin and subcutaneous tissue following other procedure: Secondary | ICD-10-CM | POA: Diagnosis not present

## 2015-08-04 DIAGNOSIS — Z8249 Family history of ischemic heart disease and other diseases of the circulatory system: Secondary | ICD-10-CM | POA: Diagnosis not present

## 2015-08-04 DIAGNOSIS — M4712 Other spondylosis with myelopathy, cervical region: Secondary | ICD-10-CM | POA: Diagnosis not present

## 2015-08-04 DIAGNOSIS — M6281 Muscle weakness (generalized): Secondary | ICD-10-CM | POA: Diagnosis not present

## 2015-08-04 DIAGNOSIS — M4312 Spondylolisthesis, cervical region: Secondary | ICD-10-CM | POA: Diagnosis not present

## 2015-08-04 DIAGNOSIS — Z803 Family history of malignant neoplasm of breast: Secondary | ICD-10-CM | POA: Diagnosis not present

## 2015-08-04 DIAGNOSIS — R2681 Unsteadiness on feet: Secondary | ICD-10-CM | POA: Diagnosis not present

## 2015-08-04 DIAGNOSIS — J8410 Pulmonary fibrosis, unspecified: Secondary | ICD-10-CM | POA: Diagnosis present

## 2015-08-04 DIAGNOSIS — M5030 Other cervical disc degeneration, unspecified cervical region: Secondary | ICD-10-CM | POA: Diagnosis not present

## 2015-08-04 DIAGNOSIS — M4802 Spinal stenosis, cervical region: Secondary | ICD-10-CM | POA: Diagnosis not present

## 2015-08-04 DIAGNOSIS — Z7401 Bed confinement status: Secondary | ICD-10-CM | POA: Diagnosis not present

## 2015-08-04 DIAGNOSIS — Z823 Family history of stroke: Secondary | ICD-10-CM | POA: Diagnosis not present

## 2015-08-04 DIAGNOSIS — M810 Age-related osteoporosis without current pathological fracture: Secondary | ICD-10-CM | POA: Diagnosis present

## 2015-08-04 DIAGNOSIS — J45909 Unspecified asthma, uncomplicated: Secondary | ICD-10-CM | POA: Diagnosis present

## 2015-08-04 DIAGNOSIS — R531 Weakness: Secondary | ICD-10-CM | POA: Diagnosis not present

## 2015-08-04 DIAGNOSIS — Z8673 Personal history of transient ischemic attack (TIA), and cerebral infarction without residual deficits: Secondary | ICD-10-CM | POA: Diagnosis not present

## 2015-08-04 DIAGNOSIS — M545 Low back pain: Secondary | ICD-10-CM | POA: Diagnosis present

## 2015-08-08 LAB — CBC AND DIFFERENTIAL
HEMATOCRIT: 35 % — AB (ref 36–46)
Hemoglobin: 11.4 g/dL — AB (ref 12.0–16.0)
PLATELETS: 231 10*3/uL (ref 150–399)
WBC: 9.1 10*3/mL

## 2015-08-10 DIAGNOSIS — R1310 Dysphagia, unspecified: Secondary | ICD-10-CM | POA: Diagnosis not present

## 2015-08-10 DIAGNOSIS — E876 Hypokalemia: Secondary | ICD-10-CM | POA: Diagnosis not present

## 2015-08-10 DIAGNOSIS — I10 Essential (primary) hypertension: Secondary | ICD-10-CM | POA: Diagnosis not present

## 2015-08-10 DIAGNOSIS — R600 Localized edema: Secondary | ICD-10-CM | POA: Diagnosis not present

## 2015-08-10 DIAGNOSIS — Z7401 Bed confinement status: Secondary | ICD-10-CM | POA: Diagnosis not present

## 2015-08-10 DIAGNOSIS — R2681 Unsteadiness on feet: Secondary | ICD-10-CM | POA: Diagnosis not present

## 2015-08-10 DIAGNOSIS — R1314 Dysphagia, pharyngoesophageal phase: Secondary | ICD-10-CM | POA: Diagnosis not present

## 2015-08-10 DIAGNOSIS — E785 Hyperlipidemia, unspecified: Secondary | ICD-10-CM | POA: Diagnosis not present

## 2015-08-10 DIAGNOSIS — R5381 Other malaise: Secondary | ICD-10-CM | POA: Diagnosis not present

## 2015-08-10 DIAGNOSIS — M4802 Spinal stenosis, cervical region: Secondary | ICD-10-CM | POA: Diagnosis not present

## 2015-08-10 DIAGNOSIS — G63 Polyneuropathy in diseases classified elsewhere: Secondary | ICD-10-CM | POA: Diagnosis not present

## 2015-08-10 DIAGNOSIS — R531 Weakness: Secondary | ICD-10-CM | POA: Diagnosis not present

## 2015-08-10 DIAGNOSIS — K5900 Constipation, unspecified: Secondary | ICD-10-CM | POA: Diagnosis not present

## 2015-08-10 DIAGNOSIS — M316 Other giant cell arteritis: Secondary | ICD-10-CM | POA: Diagnosis not present

## 2015-08-10 DIAGNOSIS — M47012 Anterior spinal artery compression syndromes, cervical region: Secondary | ICD-10-CM | POA: Diagnosis not present

## 2015-08-10 DIAGNOSIS — M6281 Muscle weakness (generalized): Secondary | ICD-10-CM | POA: Diagnosis not present

## 2015-08-10 DIAGNOSIS — D62 Acute posthemorrhagic anemia: Secondary | ICD-10-CM | POA: Diagnosis not present

## 2015-08-10 DIAGNOSIS — K0530 Chronic periodontitis, unspecified: Secondary | ICD-10-CM | POA: Diagnosis not present

## 2015-08-10 DIAGNOSIS — M5030 Other cervical disc degeneration, unspecified cervical region: Secondary | ICD-10-CM | POA: Diagnosis not present

## 2015-08-10 DIAGNOSIS — Z4789 Encounter for other orthopedic aftercare: Secondary | ICD-10-CM | POA: Diagnosis not present

## 2015-08-10 DIAGNOSIS — G629 Polyneuropathy, unspecified: Secondary | ICD-10-CM | POA: Diagnosis not present

## 2015-08-11 ENCOUNTER — Non-Acute Institutional Stay (SKILLED_NURSING_FACILITY): Payer: Medicare Other | Admitting: Adult Health

## 2015-08-11 ENCOUNTER — Encounter: Payer: Self-pay | Admitting: Adult Health

## 2015-08-11 DIAGNOSIS — M4802 Spinal stenosis, cervical region: Secondary | ICD-10-CM | POA: Diagnosis not present

## 2015-08-11 DIAGNOSIS — G63 Polyneuropathy in diseases classified elsewhere: Secondary | ICD-10-CM

## 2015-08-11 DIAGNOSIS — I10 Essential (primary) hypertension: Secondary | ICD-10-CM | POA: Diagnosis not present

## 2015-08-11 DIAGNOSIS — E785 Hyperlipidemia, unspecified: Secondary | ICD-10-CM

## 2015-08-11 DIAGNOSIS — R5381 Other malaise: Secondary | ICD-10-CM | POA: Diagnosis not present

## 2015-08-11 DIAGNOSIS — E876 Hypokalemia: Secondary | ICD-10-CM

## 2015-08-11 DIAGNOSIS — D62 Acute posthemorrhagic anemia: Secondary | ICD-10-CM

## 2015-08-11 DIAGNOSIS — K5900 Constipation, unspecified: Secondary | ICD-10-CM | POA: Diagnosis not present

## 2015-08-12 ENCOUNTER — Non-Acute Institutional Stay (SKILLED_NURSING_FACILITY): Payer: Medicare Other | Admitting: Internal Medicine

## 2015-08-12 DIAGNOSIS — I10 Essential (primary) hypertension: Secondary | ICD-10-CM | POA: Diagnosis not present

## 2015-08-12 DIAGNOSIS — M316 Other giant cell arteritis: Secondary | ICD-10-CM

## 2015-08-12 DIAGNOSIS — D62 Acute posthemorrhagic anemia: Secondary | ICD-10-CM

## 2015-08-12 DIAGNOSIS — K5900 Constipation, unspecified: Secondary | ICD-10-CM

## 2015-08-12 DIAGNOSIS — K0530 Chronic periodontitis, unspecified: Secondary | ICD-10-CM

## 2015-08-12 DIAGNOSIS — R1310 Dysphagia, unspecified: Secondary | ICD-10-CM

## 2015-08-12 DIAGNOSIS — G629 Polyneuropathy, unspecified: Secondary | ICD-10-CM

## 2015-08-12 DIAGNOSIS — R5381 Other malaise: Secondary | ICD-10-CM | POA: Diagnosis not present

## 2015-08-12 DIAGNOSIS — E785 Hyperlipidemia, unspecified: Secondary | ICD-10-CM | POA: Diagnosis not present

## 2015-08-12 DIAGNOSIS — M4802 Spinal stenosis, cervical region: Secondary | ICD-10-CM

## 2015-08-12 DIAGNOSIS — R600 Localized edema: Secondary | ICD-10-CM

## 2015-08-12 DIAGNOSIS — K047 Periapical abscess without sinus: Secondary | ICD-10-CM

## 2015-08-12 NOTE — Progress Notes (Signed)
Patient ID: [name], female   DOB: [birth year], 74 y.o.   MRN: [id number]    DATE:  08/11/15  MRN:  [id number]  BIRTHDAY: 20-Apr-1941  Facility:  Nursing Home Location:  [place] Room Number: 1206-P  LEVEL OF CARE:  SNF (31)  Contact Information    Name Relation Home Work [place] Sister [place]   [phone]      Chief Complaint  Patient presents with  . Hospitalization Follow-up    Physical deconditioning, cervical stenosis S/P anterior cervical discectomy hypertension, anemia, neuropathy, hypokalemia, hyperlipidemia, and constipation    HISTORY OF PRESENT ILLNESS:  This is a 74 year old female who was been admitted to [facility] on 08/10/15 from [facility]. She has PMH of anemia, arthritis, cancer, carpal tunnel syndrome, GERD, history of stroke, hyperlipidemia, hypertension, stroke (2 mini strokes in 2009) and temporal arteritis. She was having bilateral hand weakness and unsteadiness of gait, all consistent with a myelopathy. MRI imaging study shows a profound cervical spine stenosis at C3-C4 with a spondylolisthesis of C3 on C4 above her instrumented fusion from C4-C6. She had anterior cervical discectomy on 08/04/15. However, in the recovery room she developed swelling of the neck and tracheal deviation consistent with hematoma formation. She was brought back to the operating room for evacuation of hematoma. No recurrence of hematoma after that.  She has been admitted for a short-term rehabilitation.   PAST MEDICAL HISTORY:  Past Medical History  Diagnosis Date  . History of breast cancer   . Osteoporosis   . Idiopathic pulmonary fibrosis   . Bronchiectasis   . Vitamin D deficiency   . Hyperlipidemia   . Allergic rhinitis   . GERD (gastroesophageal reflux disease)   . Left knee DJD   . Anxiety   . Migraine   . Diverticulosis of colon   . Seizure  disorder ([place])   . Temporal arteritis (HCC)     Right eye blind, on steroids per [facility]/ Dr [name]  . CVA (cerebral infarction) 10/2007    Right thalamic   . PMR (polymyalgia rheumatica) (HCC)   . Anemia   . Gait disorder   . Peripheral neuropathy ([place])   . HTN (hypertension)   . Type II or unspecified type diabetes mellitus without mention of complication, not stated as uncontrolled     2nd to steriods  . Renal insufficiency   . Previous back surgery 10/09/12  . Polyneuropathy in other diseases classified elsewhere ([place]) 02/17/2013  . Carpal tunnel syndrome 07/13/2015    Bilateral     CURRENT MEDICATIONS: Reviewed    Medication List       This list is accurate as of: 08/11/15 11:59 PM.  Always use your most recent med list.               amLODipine 5 MG tablet  Commonly known as:  NORVASC  TAKE 1 TABLET DAILY     CALCIUM CARBONATE-VITAMIN D3 PO  Take 1 tablet by mouth daily     cholecalciferol 1000 UNITS tablet  Commonly known as:  VITAMIN D  Take 1,000 Units by mouth daily.     DOXYCYCLINE HYCLATE PO  Take 100 mg by mouth daily. Take 1 (100 mg ) tablet  by mouth daily     ferrous sulfate 325 (65 FE) MG tablet  Take 325 mg by mouth 2 (two) times daily.  furosemide 40 MG tablet  Commonly known as:  LASIX  TAKE 1 TABLET DAILY     gabapentin 300 MG capsule  Commonly known as:  NEURONTIN  Take 600 mg by mouth 2 (two) times daily.     multivitamin,tx-minerals tablet  Take 1 tablet by mouth daily.     Oxycodone HCl 10 MG Tabs  Take 10-15 mg by mouth as needed, one for moderate pain (pain scale 4-6 ) one and one half for severe ( pain scale 7-10 )     potassium chloride 10 MEQ tablet  Commonly known as:  K-DUR,KLOR-CON  Take 10 mEq by mouth daily.     predniSONE 1 MG tablet  Commonly known as:  DELTASONE  Take 1 mg by mouth daily with breakfast. Take 3 tablets of 1 mg tab together with 5mg  tablet  For a total of 8 mg daily     predniSONE 5 MG tablet    Commonly known as:  DELTASONE  TAKE 1 TABLET DAILY WITH BREAKFAST (ALONG WITH 1 MG TABLETS)     PROLIA 60 MG/ML Soln injection  Generic drug:  denosumab  Inject 60 mg into the skin every 6 (six) months.     [place] ARIA IV  Inject into the vein. Takes infusion every 8 wks.     simvastatin 5 MG tablet  Commonly known as:  ZOCOR  TAKE 1 TABLET AT BEDTIME           Allergies  Allergen Reactions  . Hydromorphone Other (See Comments)    Cognitive changes      REVIEW OF SYSTEMS:  GENERAL: no change in appetite, no fatigue, no weight changes, no fever, chills or weakness SKIN: Denies rash, itching, wounds, ulcer sores, or nail abnormality EYES: Denies change in vision, dry eyes, eye pain, itching or discharge EARS: Denies change in hearing, ringing in ears, or earache NOSE: Denies nasal congestion or epistaxis MOUTH and THROAT: Denies oral discomfort, gingival pain or bleeding, pain from teeth or hoarseness   RESPIRATORY: no cough, SOB, DOE, wheezing, hemoptysis CARDIAC: no chest pain, edema or palpitations GI: no abdominal pain, diarrhea, constipation, heart burn, nausea or vomiting GU: Denies dysuria, frequency, hematuria, incontinence, or discharge PSYCHIATRIC: Denies feeling of depression or anxiety. No report of hallucinations, insomnia, paranoia, or agitation   PHYSICAL EXAMINATION  GENERAL APPEARANCE: Well nourished. In no acute distress. Normal body habitus SKIN:  Right frontal neck surgical site with Dermabond, no redness, dry HEAD: Normal in size and contour. No evidence of trauma EYES: Lids open and close normally. No blepharitis, entropion or ectropion. PERRL. Conjunctivae are clear and sclerae are white. Lenses are without opacity EARS: Pinnae are normal. Patient hears normal voice tunes of the examiner MOUTH and THROAT: Lips are without lesions. Oral mucosa is moist and without lesions. Tongue is normal in shape, size, and color and without lesions NECK:  supple, trachea midline, no neck masses, no thyroid tenderness, no thyromegaly LYMPHATICS: no LAN in the neck, no supraclavicular LAN RESPIRATORY: breathing is even & unlabored, BS CTAB CARDIAC: RRR, no murmur,no extra heart sounds, no edema GI: abdomen soft, normal BS, no masses, no tenderness, no hepatomegaly, no splenomegaly EXTREMITIES:  Able to move 4 extremities; left foot drop PSYCHIATRIC: Alert and oriented X 3. Affect and behavior are appropriate  LABS/RADIOLOGY: Labs reviewed: Basic Metabolic Panel:  Recent Labs  01/06/15 1408  NA 139  K 4.1  CL 102  CO2 31  GLUCOSE 110*  BUN 23  CREATININE 1.26*  CALCIUM 9.6   Liver Function Tests:  Recent Labs  01/06/15 1408  AST 23  ALT 21  ALKPHOS 95  BILITOT 1.0  PROT 7.4  ALBUMIN 3.8    CBC:  Recent Labs  08/08/15  WBC 9.1  HGB 11.4*  HCT 35*  PLT 231   Lipid Panel:  Recent Labs  01/06/15 1408  HDL 48.50    ASSESSMENT/PLAN:  Physical deconditioning - her rehabilitation  Cervical (C3-C4) stenosis S/P anterior cervical discectomy - continue OxyContin 10 mg ER 1 tab by mouth every 12 hours and oxycodone 10 mg 1 -1 1/2 tab by mouth every 4 hours when necessary for pain and prednisone 8 mg by mouth daily; follow-up with spine center on 09/06/15 at 11 AM  Hypertension - continue Norvasc 5 mg 1 tab by mouth daily  Anemia, acute blood loss - hemoglobin 11.4; continue ferrous fumarate 325 mg 1 tab by mouth twice a day; check CBC  Neuropathy - continue Neurontin 300 mg take 2 capsules = 600 mg by mouth twice a day  Hypokalemia - K3.8; continue KCl 10 meq 1 tab by mouth daily; check CMP  Hyperlipidemia - continue Zocor 5 mg 1 tab by mouth daily at bedtime  Constipation - continue Senokot S2 tabs by mouth daily at bedtime when necessary     Goals of care:  Short-term rehabilitation   [facility], [facility] [phone]

## 2015-08-12 NOTE — Progress Notes (Signed)
Patient ID: [name], female   DOB: 11-10-1940, 74 y.o.   MRN: [id number]     [facility]   PCP: Eulas Post, MD  Code Status: full code  Allergies  Allergen Reactions  . Hydromorphone Other (See Comments)    Cognitive changes     Chief Complaint  Patient presents with  . New Admit To SNF     HPI:  74 y.o. patient is here for short term rehabilitation post hospital admission from 08/04/15-08/10/15 with upper extremity weakness, unsteady gait and neck pain. She had profound cervical spine stenosis at C3-C4 with a spondylolisthesis of C3 on C4 above her instrumented fusion from C4-C6. She had anterior cervical discectomy on 08/04/15. Post op she developed neck swelling and tracheal deviation with hematoma formation and required urgent evacuation of the hematoma. She clinically improved. She is seen in her room today. Her pain is under control with current regimen. She still has some weakness of her upper extremities. She has numbness with tingling to her arms which are unchanged from prior to surgery. She has PMH of anemia, carpal tunnel syndrome, GERD, CVA, pulmonary fibrosis, temporal arteritis, arthritis among others.    Review of Systems:  Constitutional: Negative for fever, chills, diaphoresis.  HENT: Negative for headache, congestion, nasal discharge.  Has difficulty swallowing since extubation. Finds it easier to have soft food. Eyes: Negative for eye pain, blurred vision, double vision and discharge.  Respiratory: Negative for cough, shortness of breath and wheezing.   Cardiovascular: Negative for chest pain, palpitations. Positive for leg swelling.  Gastrointestinal: Negative for heartburn, nausea, vomiting, abdominal pain. Had bowel movement yesterday. Genitourinary: Negative for dysuria, flank pain.  Musculoskeletal: Negative for falls. Has back pain Skin: Negative for itching, rash.  Neurological: Negative for dizziness.  Positive for weakness Psychiatric/Behavioral: Negative for depression.    Past Medical History  Diagnosis Date  . History of breast cancer   . Osteoporosis   . Idiopathic pulmonary fibrosis   . Bronchiectasis   . Vitamin D deficiency   . Hyperlipidemia   . Allergic rhinitis   . GERD (gastroesophageal reflux disease)   . Left knee DJD   . Anxiety   . Migraine   . Diverticulosis of colon   . Seizure disorder ([place])   . Temporal arteritis (HCC)     Right eye blind, on steroids per [facility]/ Dr [name]  . CVA (cerebral infarction) 10/2007    Right thalamic   . PMR (polymyalgia rheumatica) (HCC)   . Anemia   . Gait disorder   . Peripheral neuropathy ([place])   . HTN (hypertension)   . Type II or unspecified type diabetes mellitus without mention of complication, not stated as uncontrolled     2nd to steriods  . Renal insufficiency   . Previous back surgery 10/09/12  . Polyneuropathy in other diseases classified elsewhere ([place]) 02/17/2013  . Carpal tunnel syndrome 07/13/2015    Bilateral   Past Surgical History  Procedure Laterality Date  . Mastectomy    . Cholecystectomy    . Total knee arthroplasty      Left  . Tonsillectomy    . Cataract extraction      OS - Summer 2010  . Lumbar fusion  04/2010    W/Mechanical fixation - [facility]  . Spinal fusion  07/2010    T10-L2 interbody fusion / [facility]  . Colonoscopy  12/15/2011    Procedure: COLONOSCOPY;  Surgeon: [name], MD;  Location: [facility];  Service: Endoscopy;  Laterality: N/A;  . Carpal tunnel release Right   . Cervical fusion  2010    4 rods and pins in place  . Lumbar fusion  2013    rods in hips (to stabilize).  . Rheumatoid nodule removal     Social History:   reports that she has never smoked. She has never used smokeless tobacco. She reports that she does not drink alcohol or use illicit drugs.  Family History  Problem Relation Age of Onset  . Brain cancer Mother   . Hypertension  Mother   . Dementia Father   . Hypertension Father   . Breast cancer Sister   . Multiple sclerosis Sister     Medications:   Medication List       This list is accurate as of: 08/12/15 12:37 PM.  Always use your most recent med list.               amLODipine 5 MG tablet  Commonly known as:  NORVASC  TAKE 1 TABLET DAILY     CALCIUM CARBONATE-VITAMIN D3 PO  Take 1 tablet by mouth daily     cholecalciferol 1000 UNITS tablet  Commonly known as:  VITAMIN D  Take 1,000 Units by mouth daily.     DOXYCYCLINE HYCLATE PO  Take 100 mg by mouth daily. Take 1 (100 mg ) tablet  by mouth daily     ferrous sulfate 325 (65 FE) MG tablet  Take 325 mg by mouth 2 (two) times daily.     furosemide 40 MG tablet  Commonly known as:  LASIX  TAKE 1 TABLET DAILY     gabapentin 300 MG capsule  Commonly known as:  NEURONTIN  Take 600 mg by mouth 2 (two) times daily.     multivitamin,tx-minerals tablet  Take 1 tablet by mouth daily.     Oxycodone HCl 10 MG Tabs  Take 10-15 mg by mouth as needed, one for moderate pain (pain scale 4-6 ) one and one half for severe ( pain scale 7-10 )     potassium chloride 10 MEQ tablet  Commonly known as:  K-DUR,KLOR-CON  Take 10 mEq by mouth daily.     predniSONE 1 MG tablet  Commonly known as:  DELTASONE  Take 1 mg by mouth daily with breakfast. Take 3 tablets of 1 mg tab together with 5mg  tablet  For a total of 8 mg daily     predniSONE 5 MG tablet  Commonly known as:  DELTASONE  TAKE 1 TABLET DAILY WITH BREAKFAST (ALONG WITH 1 MG TABLETS)     PROLIA 60 MG/ML Soln injection  Generic drug:  denosumab  Inject 60 mg into the skin every 6 (six) months.     [place] ARIA IV  Inject into the vein. Takes infusion every 8 wks.     simvastatin 5 MG tablet  Commonly known as:  ZOCOR  TAKE 1 TABLET AT BEDTIME         Physical Exam: Filed Vitals:   08/12/15 1235  BP: 140/79  Pulse: 89  Temp: 97.9 F (36.6 C)  Resp: 18  SpO2: 93%     General- elderly female, well built, in no acute distress Head- normocephalic, atraumatic Nose- normal nasal mucosa, no maxillary or frontal sinus tenderness, no nasal discharge Throat- moist mucus membrane Eyes- PERRLA, EOMI, no pallor, no icterus, no discharge, normal conjunctiva, normal sclera Neck- no cervical lymphadenopathy Cardiovascular- normal s1,s2, no murmurs,  palpable dorsalis pedis, trace leg edema Respiratory- bilateral clear to auscultation, no wheeze, no rhonchi, no crackles, no use of accessory muscles Abdomen- bowel sounds present, soft, non tender Musculoskeletal- able to move all 4 extremities, generalized weakness and unsteady gait, arthritis changes to her digits, has kyphosis +, surgical scar to her lower back area Neurological- alert and oriented to person, place and time, sensation intact but complaints of numbness and tingling sensation to her arms below elbow region Skin- warm and dry, right anterior neck area has surgical incision with glue healing well, some peri-incision swelling present, non tender and no erythema. Has an open area in her neck with mild erythema, non tender, no drainage, bruising to the anterior chest wall noted Psychiatry- normal mood and affect    Labs reviewed: Basic Metabolic Panel:  Recent Labs  01/06/15 1408  NA 139  K 4.1  CL 102  CO2 31  GLUCOSE 110*  BUN 23  CREATININE 1.26*  CALCIUM 9.6   Liver Function Tests:  Recent Labs  01/06/15 1408  AST 23  ALT 21  ALKPHOS 95  BILITOT 1.0  PROT 7.4  ALBUMIN 3.8   No results for input(s): LIPASE, AMYLASE in the last 8760 hours. No results for input(s): AMMONIA in the last 8760 hours. CBC:  Recent Labs  08/08/15  WBC 9.1  HGB 11.4*  HCT 35*  PLT 231   Cardiac Enzymes: No results for input(s): CKTOTAL, CKMB, CKMBINDEX, TROPONINI in the last 8760 hours. BNP: Invalid input(s): POCBNP CBG: No results for input(s): GLUCAP in the last 8760 hours.  Radiological  Exams: Mr Cervical Spine Wo Contrast  07/10/2015  CLINICAL DATA:  Paresthesias both hands. Bilateral hand numbness. Cervical fusion 2011 EXAM: MRI CERVICAL SPINE WITHOUT CONTRAST TECHNIQUE: Multiplanar, multisequence MR imaging of the cervical spine was performed. No intravenous contrast was administered. COMPARISON:  Cervical MRI 11/02/2007 FINDINGS: ACDF C4 through C7 with anterior plate and interbody bone graft. Consider CT to evaluate integrity of the fusion. 5 mm anterior slip at C3-4 has developed since the prior study. Negative for fracture or mass lesion. Cord hyperintensity at C3-4 due to spinal stenosis. C2-3:  Negative C3-4: 5 mm anterior slip has developed since the prior study and appears degenerative. There is moderate spinal stenosis with cord deformity and cord hyperintensity bilaterally. Cord signal normal in 2009. Marked foraminal encroachment bilaterally due to spurring C4-5: ACDF. Foraminal narrowing bilaterally due to spurring. No cord deformity C5-6: ACDF. Foraminal narrowing bilaterally due to spurring. No cord deformity. C6-7: ACDF. Mild foraminal narrowing bilaterally without spinal stenosis C7-T1:  Disc degeneration without spinal or foraminal stenosis. T1-2:  Negative IMPRESSION: ACDF C4 through C7. Integrity of the fusion would be best evaluated by CT. Interval development of moderate to severe spinal stenosis at C3-4 with cord hyperintensity. 5 mm anterior slip of C3 on C4 with marked foraminal encroachment bilaterally. Electronically Signed   By: Franchot Gallo M.D.   On: 07/10/2015 07:52     Assessment/Plan  Physical deconditioning Will have herork with physical therapy and occupational therapy team to help with gait training and muscle strengthening exercises.fall precautions. Skin care. Encourage to be out of bed.   Cervical stenosis S/P anterior cervical discectomy. Continue OxyContin 10 mg ER 1 tab bid with oxycodone IR 10 mg 1 1/2 tab q4h prn for breakthrough pain.  Has follow up with neurosurgery. Will have patient work with PT/OT as tolerated to regain strength and restore function.  Fall precautions are in place. Neck incision healing  well. Will have bacitracin ointment applied to small opening/ skin area on her neck  Dysphagia Likely post op post extubation with some edema. Will get SLP consult to evaluate for mechanical soft diet and need for aspiration precaution  Blood loss anemia Post op from blood loss. Monitor h&h. Continue iron supplement 325 mg bid for now  HTN Stable bp reading, monitor BP, continue norvasc 5 mg daily  Neuropathy continue Neurontin 600 mg bid  Constipation Continue senokot s 2 tab qhs prn for now, bowel movement regular and patient does not want any lasxative/ stool softner scheduled  Temporal arteritis Continue prednisone 8 mg daily for now and monitor  Dental prophylaxis Currently on doxycycline 100 mg daily until seen by her orthodentist  Leg edema Chronic per patient and on lasix 40 mg daily with kcl supplement, monitor bmp  Hyperlipidemia continue Zocor 5 mg daily   Goals of care: short term rehabilitation   Labs/tests ordered: cbc, cmp  Family/ staff Communication: reviewed care plan with patient and nursing supervisor    Blanchie Serve, MD  [facility] [phone] (Monday-Friday 8 am - 5 pm) [phone] (afterhours)

## 2015-08-17 ENCOUNTER — Non-Acute Institutional Stay (SKILLED_NURSING_FACILITY): Payer: Medicare Other | Admitting: Adult Health

## 2015-08-17 DIAGNOSIS — E785 Hyperlipidemia, unspecified: Secondary | ICD-10-CM

## 2015-08-17 DIAGNOSIS — E876 Hypokalemia: Secondary | ICD-10-CM

## 2015-08-17 DIAGNOSIS — I10 Essential (primary) hypertension: Secondary | ICD-10-CM | POA: Diagnosis not present

## 2015-08-17 DIAGNOSIS — R5381 Other malaise: Secondary | ICD-10-CM | POA: Diagnosis not present

## 2015-08-17 DIAGNOSIS — D62 Acute posthemorrhagic anemia: Secondary | ICD-10-CM

## 2015-08-17 DIAGNOSIS — M316 Other giant cell arteritis: Secondary | ICD-10-CM | POA: Diagnosis not present

## 2015-08-17 DIAGNOSIS — R600 Localized edema: Secondary | ICD-10-CM | POA: Diagnosis not present

## 2015-08-17 DIAGNOSIS — M4802 Spinal stenosis, cervical region: Secondary | ICD-10-CM

## 2015-08-17 DIAGNOSIS — K5900 Constipation, unspecified: Secondary | ICD-10-CM | POA: Diagnosis not present

## 2015-08-17 DIAGNOSIS — G629 Polyneuropathy, unspecified: Secondary | ICD-10-CM | POA: Diagnosis not present

## 2015-08-18 ENCOUNTER — Encounter: Payer: Self-pay | Admitting: Adult Health

## 2015-08-18 NOTE — Progress Notes (Signed)
Patient ID: [name], female   DOB: 09-Jun-1941, 74 y.o.   MRN: [id number]    DATE:  08/17/15  MRN:  [id number]  BIRTHDAY: Feb 01, 1941  Facility:  [facility]ursing Home Location:  [place] Room Number: 1206-P  LEVEL OF CARE:  SNF (31)      Contact Information    Name Relation Home Work [place] Sister [place]   [phone]   Alfonse Spruce   [phone]      Chief Complaint  Patient presents with  . Discharge Note    Physical deconditioning, cervical stenosis S/P anterior cervical discectomy hypertension, anemia, neuropathy, hypokalemia, hyperlipidemia, temporal artyeritis, leg edema and constipation    HISTORY OF PRESENT ILLNESS:  This is a 74 year old female who is for discharge home with Home health PT and OT. She has been admitted to [facility] on 08/10/15 from [facility]. She has PMH of anemia, arthritis, cancer, carpal tunnel syndrome, GERD, history of stroke, hyperlipidemia, hypertension, stroke (2 mini strokes in 2009) and temporal arteritis. She was having bilateral hand weakness and unsteadiness of gait, all consistent with a myelopathy. MRI imaging study shows a profound cervical spine stenosis at C3-C4 with a spondylolisthesis of C3 on C4 above her instrumented fusion from C4-C6. She had anterior cervical discectomy on 08/04/15. However, in the recovery room she developed swelling of the neck and tracheal deviation consistent with hematoma formation. She was brought back to the operating room for evacuation of hematoma. No recurrence of hematoma after that.  Patient was admitted to this facility for short-term rehabilitation after the patient's recent hospitalization.  Patient has completed SNF rehabilitation and therapy has cleared the patient for discharge.   PAST MEDICAL HISTORY:  Past Medical History  Diagnosis Date  . History of breast cancer   .  Osteoporosis   . Idiopathic pulmonary fibrosis   . Bronchiectasis   . Vitamin D deficiency   . Hyperlipidemia   . Allergic rhinitis   . GERD (gastroesophageal reflux disease)   . Left knee DJD   . Anxiety   . Migraine   . Diverticulosis of colon   . Seizure disorder ([place])   . Temporal arteritis (HCC)     Right eye blind, on steroids per [facility]/ Dr [name]  . CVA (cerebral infarction) 10/2007    Right thalamic   . PMR (polymyalgia rheumatica) (HCC)   . Anemia   . Gait disorder   . Peripheral neuropathy ([place])   . HTN (hypertension)   . Type II or unspecified type diabetes mellitus without mention of complication, not stated as uncontrolled     2nd to steriods  . Renal insufficiency   . Previous back surgery 10/09/12  . Polyneuropathy in other diseases classified elsewhere ([place]) 02/17/2013  . Carpal tunnel syndrome 07/13/2015    Bilateral     CURRENT MEDICATIONS: Reviewed    Medication List       This list is accurate as of: 08/17/15 11:59 PM.  Always use your most recent med list.               amLODipine 5 MG tablet  Commonly known as:  NORVASC  TAKE 1 TABLET DAILY     CALCIUM CARBONATE-VITAMIN D3 PO  Take 1 tablet by mouth daily     cholecalciferol 1000 UNITS tablet  Commonly known as:  VITAMIN D  Take 1,000 Units by mouth daily.  DOXYCYCLINE HYCLATE PO  Take 100 mg by mouth daily. Take 1 (100 mg ) tablet  by mouth daily     ferrous sulfate 325 (65 FE) MG tablet  Take 325 mg by mouth 2 (two) times daily.     furosemide 40 MG tablet  Commonly known as:  LASIX  TAKE 1 TABLET DAILY     gabapentin 300 MG capsule  Commonly known as:  NEURONTIN  Take 600 mg by mouth 2 (two) times daily.     multivitamin,tx-minerals tablet  Take 1 tablet by mouth daily.     Oxycodone HCl 10 MG Tabs  Take 10-15 mg by mouth as needed, one for moderate pain (pain scale 4-6 ) one and one half for severe ( pain scale 7-10 )     potassium chloride 10 MEQ tablet  Commonly  known as:  K-DUR,KLOR-CON  Take 10 mEq by mouth daily.     predniSONE 1 MG tablet  Commonly known as:  DELTASONE  Take 1 mg by mouth daily with breakfast. Take 3 tablets of 1 mg tab together with 5mg  tablet  For a total of 8 mg daily     predniSONE 5 MG tablet  Commonly known as:  DELTASONE  TAKE 1 TABLET DAILY WITH BREAKFAST (ALONG WITH 1 MG TABLETS)     PROLIA 60 MG/ML Soln injection  Generic drug:  denosumab  Inject 60 mg into the skin every 6 (six) months.     [place] ARIA IV  Inject into the vein. Takes infusion every 8 wks.     simvastatin 5 MG tablet  Commonly known as:  ZOCOR  TAKE 1 TABLET AT BEDTIME         Allergies  Allergen Reactions  . Hydromorphone Other (See Comments)    Cognitive changes      REVIEW OF SYSTEMS:  GENERAL: no change in appetite, no fatigue, no weight changes, no fever, chills or weakness EYES: Denies change in vision, dry eyes, eye pain, itching or discharge EARS: Denies change in hearing, ringing in ears, or earache NOSE: Denies nasal congestion or epistaxis MOUTH and THROAT: Denies oral discomfort, gingival pain or bleeding, pain from teeth or hoarseness   RESPIRATORY: no cough, SOB, DOE, wheezing, hemoptysis CARDIAC: no chest pain, edema or palpitations GI: no abdominal pain, diarrhea, constipation, heart burn, nausea or vomiting GU: Denies dysuria, frequency, hematuria, incontinence, or discharge PSYCHIATRIC: Denies feeling of depression or anxiety. No report of hallucinations, insomnia, paranoia, or agitation   PHYSICAL EXAMINATION  GENERAL APPEARANCE: Well nourished. In no acute distress. Normal body habitus SKIN:  Right frontal neck surgical site with Dermabond, no redness, dry HEAD: Normal in size and contour. No evidence of trauma EYES: Lids open and close normally. No blepharitis, entropion or ectropion. PERRL. Conjunctivae are clear and sclerae are white. Lenses are without opacity EARS: Pinnae are normal. Patient hears  normal voice tunes of the examiner MOUTH and THROAT: Lips are without lesions. Oral mucosa is moist and without lesions. Tongue is normal in shape, size, and color and without lesions NECK: supple, trachea midline, no neck masses, no thyroid tenderness, no thyromegaly LYMPHATICS: no LAN in the neck, no supraclavicular LAN RESPIRATORY: breathing is even & unlabored, BS CTAB CARDIAC: RRR, no murmur,no extra heart sounds, no edema GI: abdomen soft, normal BS, no masses, no tenderness, no hepatomegaly, no splenomegaly EXTREMITIES:  Able to move 4 extremities; left foot drop PSYCHIATRIC: Alert and oriented X 3. Affect and behavior are appropriate  LABS/RADIOLOGY: Labs  reviewed: 08/12/15  WBC 9.4 hemoglobin 11.6 hematocrit 36.0 MCV 98.1 platelet 285 sodium 140 potassium 3.8 glucose 74 BUN 23 creatinine 1.10 calcium 8.8 total protein 5.4 albumin 3.29 total bilirubin 0.75 alkaline phosphatase 69 SGOT 19 SGPT 5 Basic Metabolic Panel:  Recent Labs  01/06/15 1408  NA 139  K 4.1  CL 102  CO2 31  GLUCOSE 110*  BUN 23  CREATININE 1.26*  CALCIUM 9.6   Liver Function Tests:  Recent Labs  01/06/15 1408  AST 23  ALT 21  ALKPHOS 95  BILITOT 1.0  PROT 7.4  ALBUMIN 3.8    CBC:  Recent Labs  08/08/15  WBC 9.1  HGB 11.4*  HCT 35*  PLT 231   Lipid Panel:  Recent Labs  01/06/15 1408  HDL 48.50    ASSESSMENT/PLAN:  Physical deconditioning - for home health PT and OT  Cervical (C3-C4) stenosis S/P anterior cervical discectomy - continue OxyContin 10 mg ER 1 tab by mouth every 12 hours and oxycodone 10 mg 1 -1 1/2 tab by mouth every 4 hours when necessary for pain; follow-up with spine center on 09/06/15 at 11 AM  Hypertension - continue Norvasc 5 mg 1 tab by mouth daily and Lasix 40 mg PO daily  Anemia, acute blood loss - hemoglobin 11. On re-check; continue ferrous fumarate 325 mg 1 tab by mouth twice a day  Neuropathy - continue Neurontin 300 mg take 2 capsules = 600 mg by  mouth twice a day  Hypokalemia - K3.8 on re-check; continue KCl 10 meq 1 tab by mouth daily  Hyperlipidemia - continue Zocor 5 mg 1 tab by mouth daily at bedtime  Constipation - continue Senokot S2 tabs by mouth daily at bedtime when necessary  Temporal arteritis - continue Prednisone 8 mg PO daily  Leg edema - continue Lasix 40 mg PO daily      I have filled out patient's discharge paperwork and written prescriptions.  Patient will receive home health PT and OT.  Total discharge time: Less than 30 minutes  Discharge time involved coordination of the discharge process with Education officer, museum, nursing staff and therapy department. Medical justification for home health services verified.     [facility], NP [employer] [phone]

## 2015-08-21 DIAGNOSIS — M4712 Other spondylosis with myelopathy, cervical region: Secondary | ICD-10-CM | POA: Diagnosis not present

## 2015-08-21 DIAGNOSIS — R1310 Dysphagia, unspecified: Secondary | ICD-10-CM | POA: Diagnosis not present

## 2015-08-21 DIAGNOSIS — M6281 Muscle weakness (generalized): Secondary | ICD-10-CM | POA: Diagnosis not present

## 2015-08-21 DIAGNOSIS — E099 Drug or chemical induced diabetes mellitus without complications: Secondary | ICD-10-CM | POA: Diagnosis not present

## 2015-08-21 DIAGNOSIS — M5137 Other intervertebral disc degeneration, lumbosacral region: Secondary | ICD-10-CM | POA: Diagnosis not present

## 2015-08-21 DIAGNOSIS — Z4789 Encounter for other orthopedic aftercare: Secondary | ICD-10-CM | POA: Diagnosis not present

## 2015-08-22 ENCOUNTER — Telehealth: Payer: Self-pay | Admitting: Family Medicine

## 2015-08-22 DIAGNOSIS — M5137 Other intervertebral disc degeneration, lumbosacral region: Secondary | ICD-10-CM | POA: Diagnosis not present

## 2015-08-22 DIAGNOSIS — M6281 Muscle weakness (generalized): Secondary | ICD-10-CM | POA: Diagnosis not present

## 2015-08-22 DIAGNOSIS — E099 Drug or chemical induced diabetes mellitus without complications: Secondary | ICD-10-CM | POA: Diagnosis not present

## 2015-08-22 DIAGNOSIS — Z4789 Encounter for other orthopedic aftercare: Secondary | ICD-10-CM | POA: Diagnosis not present

## 2015-08-22 DIAGNOSIS — M4712 Other spondylosis with myelopathy, cervical region: Secondary | ICD-10-CM | POA: Diagnosis not present

## 2015-08-22 DIAGNOSIS — R1310 Dysphagia, unspecified: Secondary | ICD-10-CM | POA: Diagnosis not present

## 2015-08-22 NOTE — Telephone Encounter (Signed)
yes

## 2015-08-22 NOTE — Telephone Encounter (Signed)
Okay 

## 2015-08-22 NOTE — Telephone Encounter (Signed)
[name] with [name] would like a verbal order for physical therapy for 3 times a week for one week and two times a week for three weeks for balance and strengthening and gait training at home.

## 2015-08-22 NOTE — Telephone Encounter (Signed)
Ebony Hail is aware of verbal orders.

## 2015-08-24 ENCOUNTER — Ambulatory Visit: Payer: Medicare Other | Admitting: Family Medicine

## 2015-08-24 DIAGNOSIS — M5137 Other intervertebral disc degeneration, lumbosacral region: Secondary | ICD-10-CM | POA: Diagnosis not present

## 2015-08-24 DIAGNOSIS — E099 Drug or chemical induced diabetes mellitus without complications: Secondary | ICD-10-CM | POA: Diagnosis not present

## 2015-08-24 DIAGNOSIS — M6281 Muscle weakness (generalized): Secondary | ICD-10-CM | POA: Diagnosis not present

## 2015-08-24 DIAGNOSIS — R1310 Dysphagia, unspecified: Secondary | ICD-10-CM | POA: Diagnosis not present

## 2015-08-24 DIAGNOSIS — Z4789 Encounter for other orthopedic aftercare: Secondary | ICD-10-CM | POA: Diagnosis not present

## 2015-08-24 DIAGNOSIS — M4712 Other spondylosis with myelopathy, cervical region: Secondary | ICD-10-CM | POA: Diagnosis not present

## 2015-08-29 ENCOUNTER — Encounter: Payer: Self-pay | Admitting: Family Medicine

## 2015-08-29 ENCOUNTER — Ambulatory Visit (INDEPENDENT_AMBULATORY_CARE_PROVIDER_SITE_OTHER): Payer: Medicare Other | Admitting: Family Medicine

## 2015-08-29 VITALS — BP 120/70 | HR 99 | Temp 98.3°F | Resp 16 | Ht 62.0 in | Wt 160.9 lb

## 2015-08-29 DIAGNOSIS — R1310 Dysphagia, unspecified: Secondary | ICD-10-CM

## 2015-08-29 DIAGNOSIS — I10 Essential (primary) hypertension: Secondary | ICD-10-CM | POA: Diagnosis not present

## 2015-08-29 DIAGNOSIS — G8929 Other chronic pain: Secondary | ICD-10-CM | POA: Diagnosis not present

## 2015-08-29 DIAGNOSIS — M549 Dorsalgia, unspecified: Secondary | ICD-10-CM

## 2015-08-29 NOTE — Progress Notes (Signed)
Subjective:    Patient ID: [name], female    DOB: 01-Jan-1941, 74 y.o.   MRN: [id number]  HPI   Patient here for hospital follow-up. She had surgery [facility] November 10. She's had multiple previous back surgeries. She underwent anterior cervical discectomy and fusion at C3-C4 with revision of existing ACDF plate C3-C6. Patient apparently had a small hematoma of the neck following surgery but otherwise no complications. Minimal anemia with hemoglobin 11.4 post operative. She's done well since then with less neck pain. She has chronic low back pain which has stable and fairly well-controlled on OxyContin which she has been on for multiple years. She takes oxycodone to supplement. She has physical therapy and occupational therapy in place.  Has had occasional mild dysphagia since her surgery to solid foods such as meats. Overall, this is improving. Denies any active reflux symptoms. Her blood pressures been well controlled with amlodipine. No dizziness. No chest pains. No dyspnea.  Past Medical History  Diagnosis Date  . History of breast cancer   . Osteoporosis   . Idiopathic pulmonary fibrosis   . Bronchiectasis   . Vitamin D deficiency   . Hyperlipidemia   . Allergic rhinitis   . GERD (gastroesophageal reflux disease)   . Left knee DJD   . Anxiety   . Migraine   . Diverticulosis of colon   . Seizure disorder ([place])   . Temporal arteritis (HCC)     Right eye blind, on steroids per [facility]/ Dr [name]  . CVA (cerebral infarction) 10/2007    Right thalamic   . PMR (polymyalgia rheumatica) (HCC)   . Anemia   . Gait disorder   . Peripheral neuropathy ([place])   . HTN (hypertension)   . Type II or unspecified type diabetes mellitus without mention of complication, not stated as uncontrolled     2nd to steriods  . Renal insufficiency   . Previous back surgery 10/09/12  . Polyneuropathy in other diseases classified elsewhere ([place]) 02/17/2013  . Carpal  tunnel syndrome 07/13/2015    Bilateral   Past Surgical History  Procedure Laterality Date  . Mastectomy    . Cholecystectomy    . Total knee arthroplasty      Left  . Tonsillectomy    . Cataract extraction      OS - Summer 2010  . Lumbar fusion  04/2010    W/Mechanical fixation - [facility]  . Spinal fusion  07/2010    T10-L2 interbody fusion / [facility]  . Colonoscopy  12/15/2011    Procedure: COLONOSCOPY;  Surgeon: [name], MD;  Location: [facility] ENDOSCOPY;  Service: Endoscopy;  Laterality: N/A;  . Carpal tunnel release Right   . Cervical fusion  2010    4 rods and pins in place  . Lumbar fusion  2013    rods in hips (to stabilize).  . Rheumatoid nodule removal      reports that she has never smoked. She has never used smokeless tobacco. She reports that she does not drink alcohol or use illicit drugs. family history includes Brain cancer in her mother; Breast cancer in her sister; Dementia in her father; Hypertension in her father and mother; Multiple sclerosis in her sister. Allergies  Allergen Reactions  . Hydromorphone Other (See Comments)    Cognitive changes       Review of Systems  Constitutional: Negative for fatigue.  Eyes: Negative for visual disturbance.  Respiratory: Negative for cough, chest  tightness, shortness of breath and wheezing.   Cardiovascular: Negative for chest pain, palpitations and leg swelling.  Gastrointestinal: Negative for abdominal pain.  Musculoskeletal: Positive for back pain.  Neurological: Negative for dizziness, seizures, syncope, weakness, light-headedness and headaches.       Objective:   Physical Exam  Constitutional: She is oriented to person, place, and time. She appears well-developed and well-nourished.  Neck: Neck supple.  Neck wound is healing well. She has some dried glue sticking up from the skin and she requested that we trim this away with scissors so that it does not get caught on clothing. No  erythema or other signs of infection  Cardiovascular: Normal rate and regular rhythm.   Pulmonary/Chest: Effort normal and breath sounds normal. She has no wheezes.  Musculoskeletal: She exhibits no edema.  Neurological: She is alert and oriented to person, place, and time.  Psychiatric: She has a normal mood and affect. Her behavior is normal.          Assessment & Plan:  #1 chronic cervicalgia with recent anterior cervical discectomy and fusion as above. She has done well and will continue with physical therapy and occupational therapy. #2 hypertension stable and at goal. Continue current medications #3 chronic low back pain. Currently controlled on OxyContin and oxycodone. No recent constipation issues. She's been on this regimen for several years and has done fairly well. No history of abuse or misuse of medication. #4 mild dysphagia. Improving. We've advised her to avoid hard to chew meat and takes small bites and chew her food thoroughly. If she has any recurrence or worsening of symptoms consider GI referral.

## 2015-08-29 NOTE — Progress Notes (Signed)
Pre visit review using our clinic review tool, if applicable. No additional management support is needed unless otherwise documented below in the visit note. 

## 2015-08-30 DIAGNOSIS — R1310 Dysphagia, unspecified: Secondary | ICD-10-CM | POA: Diagnosis not present

## 2015-08-30 DIAGNOSIS — E099 Drug or chemical induced diabetes mellitus without complications: Secondary | ICD-10-CM | POA: Diagnosis not present

## 2015-08-30 DIAGNOSIS — M5137 Other intervertebral disc degeneration, lumbosacral region: Secondary | ICD-10-CM | POA: Diagnosis not present

## 2015-08-30 DIAGNOSIS — M4712 Other spondylosis with myelopathy, cervical region: Secondary | ICD-10-CM | POA: Diagnosis not present

## 2015-08-30 DIAGNOSIS — M6281 Muscle weakness (generalized): Secondary | ICD-10-CM | POA: Diagnosis not present

## 2015-08-30 DIAGNOSIS — Z4789 Encounter for other orthopedic aftercare: Secondary | ICD-10-CM | POA: Diagnosis not present

## 2015-08-31 ENCOUNTER — Ambulatory Visit (INDEPENDENT_AMBULATORY_CARE_PROVIDER_SITE_OTHER): Payer: Medicare Other | Admitting: Neurology

## 2015-08-31 ENCOUNTER — Encounter: Payer: Self-pay | Admitting: Neurology

## 2015-08-31 VITALS — BP 123/80 | HR 90 | Ht 61.5 in | Wt 160.0 lb

## 2015-08-31 DIAGNOSIS — M4712 Other spondylosis with myelopathy, cervical region: Secondary | ICD-10-CM

## 2015-08-31 DIAGNOSIS — M316 Other giant cell arteritis: Secondary | ICD-10-CM

## 2015-08-31 DIAGNOSIS — G5603 Carpal tunnel syndrome, bilateral upper limbs: Secondary | ICD-10-CM

## 2015-08-31 DIAGNOSIS — G63 Polyneuropathy in diseases classified elsewhere: Secondary | ICD-10-CM

## 2015-08-31 HISTORY — DX: Other spondylosis with myelopathy, cervical region: M47.12

## 2015-08-31 NOTE — Patient Instructions (Signed)
Fall Prevention in the Home  Falls can cause injuries and can affect people from all age groups. There are many simple things that you can do to make your home safe and to help prevent falls. WHAT CAN I DO ON THE OUTSIDE OF MY HOME?  Regularly repair the edges of walkways and driveways and fix any cracks.  Remove high doorway thresholds.  Trim any shrubbery on the main path into your home.  Use bright outdoor lighting.  Clear walkways of debris and clutter, including tools and rocks.  Regularly check that handrails are securely fastened and in good repair. Both sides of any steps should have handrails.  Install guardrails along the edges of any raised decks or porches.  Have leaves, snow, and ice cleared regularly.  Use sand or salt on walkways during winter months.  In the garage, clean up any spills right away, including grease or oil spills. WHAT CAN I DO IN THE BATHROOM?  Use night lights.  Install grab bars by the toilet and in the tub and shower. Do not use towel bars as grab bars.  Use non-skid mats or decals on the floor of the tub or shower.  If you need to sit down while you are in the shower, use a plastic, non-slip stool..  Keep the floor dry. Immediately clean up any water that spills on the floor.  Remove soap buildup in the tub or shower on a regular basis.  Attach bath mats securely with double-sided non-slip rug tape.  Remove throw rugs and other tripping hazards from the floor. WHAT CAN I DO IN THE BEDROOM?  Use night lights.  Make sure that a bedside light is easy to reach.  Do not use oversized bedding that drapes onto the floor.  Have a firm chair that has side arms to use for getting dressed.  Remove throw rugs and other tripping hazards from the floor. WHAT CAN I DO IN THE KITCHEN?   Clean up any spills right away.  Avoid walking on wet floors.  Place frequently used items in easy-to-reach places.  If you need to reach for something  above you, use a sturdy step stool that has a grab bar.  Keep electrical cables out of the way.  Do not use floor polish or wax that makes floors slippery. If you have to use wax, make sure that it is non-skid floor wax.  Remove throw rugs and other tripping hazards from the floor. WHAT CAN I DO IN THE STAIRWAYS?  Do not leave any items on the stairs.  Make sure that there are handrails on both sides of the stairs. Fix handrails that are broken or loose. Make sure that handrails are as long as the stairways.  Check any carpeting to make sure that it is firmly attached to the stairs. Fix any carpet that is loose or worn.  Avoid having throw rugs at the top or bottom of stairways, or secure the rugs with carpet tape to prevent them from moving.  Make sure that you have a light switch at the top of the stairs and the bottom of the stairs. If you do not have them, have them installed. WHAT ARE SOME OTHER FALL PREVENTION TIPS?  Wear closed-toe shoes that fit well and support your feet. Wear shoes that have rubber soles or low heels.  When you use a stepladder, make sure that it is completely opened and that the sides are firmly locked. Have someone hold the ladder while you   are using it. Do not climb a closed stepladder.  Add color or contrast paint or tape to grab bars and handrails in your home. Place contrasting color strips on the first and last steps.  Use mobility aids as needed, such as canes, walkers, scooters, and crutches.  Turn on lights if it is dark. Replace any light bulbs that burn out.  Set up furniture so that there are clear paths. Keep the furniture in the same spot.  Fix any uneven floor surfaces.  Choose a carpet design that does not hide the edge of steps of a stairway.  Be aware of any and all pets.  Review your medicines with your healthcare provider. Some medicines can cause dizziness or changes in blood pressure, which increase your risk of falling. Talk  with your health care provider about other ways that you can decrease your risk of falls. This may include working with a physical therapist or trainer to improve your strength, balance, and endurance.   This information is not intended to replace advice given to you by your health care provider. Make sure you discuss any questions you have with your health care provider.   Document Released: 08/31/2002 Document Revised: 01/25/2015 Document Reviewed: 10/15/2014 Elsevier Interactive Patient Education 2016 [employer]  

## 2015-08-31 NOTE — Progress Notes (Signed)
Reason for visit: Temporal arteritis  [name] is an 74 y.o. female  History of present illness:  [name] is a 74 year old right-handed white female with a history of temporal arteritis, on a relatively low dose of prednisone, taking 6 mg a day. The patient recently had a decline in her ability to ambulate, she was found to have a C3-4 level cervical myelopathy, she was sent to [facility] and underwent cervical decompression by Dr. Harl Bowie. The patient had some bleeding complications, and she has recovered fairly well following surgery, she spent one week in an extended care facility. The patient still getting home health physical therapy, she has not yet gotten back to driving a car. The patient denies any changes in vision. Her walking has improved some, but is not back to baseline. She denies any issues controlling the bowels or the bladder. She returns for an evaluation.  Past Medical History  Diagnosis Date  . History of breast cancer   . Osteoporosis   . Idiopathic pulmonary fibrosis   . Bronchiectasis   . Vitamin D deficiency   . Hyperlipidemia   . Allergic rhinitis   . GERD (gastroesophageal reflux disease)   . Left knee DJD   . Anxiety   . Migraine   . Diverticulosis of colon   . Seizure disorder ([place])   . Temporal arteritis (HCC)     Right eye blind, on steroids per [facility]/ Dr [name]  . CVA (cerebral infarction) 10/2007    Right thalamic   . PMR (polymyalgia rheumatica) (HCC)   . Anemia   . Gait disorder   . Peripheral neuropathy ([place])   . HTN (hypertension)   . Type II or unspecified type diabetes mellitus without mention of complication, not stated as uncontrolled     2nd to steriods  . Renal insufficiency   . Previous back surgery 10/09/12  . Polyneuropathy in other diseases classified elsewhere ([place]) 02/17/2013  . Carpal tunnel syndrome 07/13/2015    Bilateral  . Spondylosis, cervical, with myelopathy 08/31/2015    C3-4 myelopathy    Past Surgical  History  Procedure Laterality Date  . Mastectomy    . Cholecystectomy    . Total knee arthroplasty      Left  . Tonsillectomy    . Cataract extraction      OS - Summer 2010  . Lumbar fusion  04/2010    W/Mechanical fixation - [facility]  . Spinal fusion  07/2010    T10-L2 interbody fusion / [facility]  . Colonoscopy  12/15/2011    Procedure: COLONOSCOPY;  Surgeon: [name], MD;  Location: [facility];  Service: Endoscopy;  Laterality: N/A;  . Carpal tunnel release Right   . Cervical fusion  2010    4 rods and pins in place  . Lumbar fusion  2013    rods in hips (to stabilize).  . Rheumatoid nodule removal    . Neck surgery      Family History  Problem Relation Age of Onset  . Brain cancer Mother   . Hypertension Mother   . Dementia Father   . Hypertension Father   . Breast cancer Sister   . Multiple sclerosis Sister     Social history:  reports that she has never smoked. She has never used smokeless tobacco. She reports that she does not drink alcohol or use illicit drugs.    Allergies  Allergen Reactions  . Hydromorphone Other (See Comments)    Cognitive  changes     Medications:  Prior to Admission medications   Medication Sig Start Date End Date Taking? Authorizing Provider  amLODipine (NORVASC) 5 MG tablet TAKE 1 TABLET DAILY 12/23/14  Yes Eulas Post, MD  Calcium Carb-Cholecalciferol (CALCIUM CARBONATE-VITAMIN D3 PO) Take 1 tablet by mouth daily   Yes Historical Provider, MD  cholecalciferol (VITAMIN D) 1000 UNITS tablet Take 1,000 Units by mouth daily.     Yes Historical Provider, MD  denosumab (PROLIA) 60 MG/ML SOLN Inject 60 mg into the skin every 6 (six) months.     Yes Historical Provider, MD  DOXYCYCLINE HYCLATE PO Take 100 mg by mouth daily. Take 1 (100 mg ) tablet  by mouth daily   Yes Historical Provider, MD  ferrous sulfate 325 (65 FE) MG tablet Take 325 mg by mouth 2 (two) times daily.    Yes Historical Provider, MD    furosemide (LASIX) 40 MG tablet TAKE 1 TABLET DAILY 12/07/14  Yes Eulas Post, MD  gabapentin (NEURONTIN) 300 MG capsule Take 600 mg by mouth 2 (two) times daily.   Yes Historical Provider, MD  Golimumab ([place] ARIA IV) Inject into the vein. Takes infusion every 8 wks.   Yes Historical Provider, MD  Multiple Vitamins-Minerals (MULTIVITAMIN,TX-MINERALS) tablet Take 1 tablet by mouth daily.     Yes Historical Provider, MD  Oxycodone HCl 10 MG TABS Take 10-15 mg by mouth as needed, one for moderate pain (pain scale 4-6 ) one and one half for severe ( pain scale 7-10 )   Yes Historical Provider, MD  potassium chloride (K-DUR,KLOR-CON) 10 MEQ tablet Take 10 mEq by mouth daily. 03/02/13  Yes Neena Rhymes, MD  predniSONE (DELTASONE) 5 MG tablet TAKE 1 TABLET DAILY WITH BREAKFAST (ALONG WITH 1 MG TABLETS) 05/27/15  Yes Ward Givens, NP  simvastatin (ZOCOR) 5 MG tablet TAKE 1 TABLET AT BEDTIME 01/24/15  Yes Eulas Post, MD    ROS:  Out of a complete 14 system review of symptoms, the patient complains only of the following symptoms, and all other reviewed systems are negative.  Difficulty swallowing Frequency of urination Walking difficulty Numbness in the hands  Blood pressure 123/80, pulse 90, height 5' 1.5" (1.562 m), weight 160 lb (72.576 kg).  Physical Exam  General: The patient is alert and cooperative at the time of the examination.  Skin: No significant peripheral edema is noted.   Neurologic Exam  Mental status: The patient is alert and oriented x 3 at the time of the examination. The patient has apparent normal recent and remote memory, with an apparently normal attention span and concentration ability.   Cranial nerves: Facial symmetry is present. Speech is normal, no aphasia or dysarthria is noted. Extraocular movements are full. Visual fields are full. The patient has decreased vision in the right eye.  Motor: The patient has good strength in all 4 extremities,  with exception of a left sided footdrop.  Sensory examination: Soft touch sensation is symmetric on the face, arms, and legs.  Coordination: The patient has good finger-nose-finger and heel-to-shin bilaterally.  Gait and station: The patient has a slightly wide-based gait, the patient can ambulate independently. She usually walks with a walker. Tandem gait was not attempted. Romberg is negative.  Reflexes: Deep tendon reflexes are symmetric, but are depressed.   Assessment/Plan:  1. Temporal arteritis  2. Cervical myelopathy, status post decompressive surgery  3. Gait disorder  4. Bilateral carpal tunnel syndrome  The patient does report some  numbness in the hands, this likely is related to the carpal tunnel syndrome, but we will not address this issue until she fully recovers from her cervical spine surgery. The patient has regained some of her balance, but still she is not at baseline. She is continuing with physical therapy. We will check a sedimentation rate today. The patient is on 6 mg daily of prednisone. She will follow-up in 6 months, sooner if needed.  Jill Alexanders MD 08/31/2015 7:25 PM  [facility] [address] [place], [place] [address]  Phone [phone] Fax [phone]

## 2015-09-01 ENCOUNTER — Other Ambulatory Visit: Payer: Self-pay | Admitting: Family Medicine

## 2015-09-01 DIAGNOSIS — M4712 Other spondylosis with myelopathy, cervical region: Secondary | ICD-10-CM | POA: Diagnosis not present

## 2015-09-01 DIAGNOSIS — E099 Drug or chemical induced diabetes mellitus without complications: Secondary | ICD-10-CM | POA: Diagnosis not present

## 2015-09-01 DIAGNOSIS — Z4789 Encounter for other orthopedic aftercare: Secondary | ICD-10-CM | POA: Diagnosis not present

## 2015-09-01 DIAGNOSIS — M6281 Muscle weakness (generalized): Secondary | ICD-10-CM | POA: Diagnosis not present

## 2015-09-01 DIAGNOSIS — R1310 Dysphagia, unspecified: Secondary | ICD-10-CM | POA: Diagnosis not present

## 2015-09-01 DIAGNOSIS — M5137 Other intervertebral disc degeneration, lumbosacral region: Secondary | ICD-10-CM | POA: Diagnosis not present

## 2015-09-01 LAB — SEDIMENTATION RATE: Sed Rate: 18 mm/hr (ref 0–40)

## 2015-09-05 DIAGNOSIS — Z4789 Encounter for other orthopedic aftercare: Secondary | ICD-10-CM | POA: Diagnosis not present

## 2015-09-05 DIAGNOSIS — Z981 Arthrodesis status: Secondary | ICD-10-CM | POA: Diagnosis not present

## 2015-09-06 DIAGNOSIS — M0589 Other rheumatoid arthritis with rheumatoid factor of multiple sites: Secondary | ICD-10-CM | POA: Diagnosis not present

## 2015-09-07 DIAGNOSIS — Z4789 Encounter for other orthopedic aftercare: Secondary | ICD-10-CM | POA: Diagnosis not present

## 2015-09-07 DIAGNOSIS — R1310 Dysphagia, unspecified: Secondary | ICD-10-CM | POA: Diagnosis not present

## 2015-09-07 DIAGNOSIS — M5137 Other intervertebral disc degeneration, lumbosacral region: Secondary | ICD-10-CM | POA: Diagnosis not present

## 2015-09-07 DIAGNOSIS — E099 Drug or chemical induced diabetes mellitus without complications: Secondary | ICD-10-CM | POA: Diagnosis not present

## 2015-09-07 DIAGNOSIS — M4712 Other spondylosis with myelopathy, cervical region: Secondary | ICD-10-CM | POA: Diagnosis not present

## 2015-09-07 DIAGNOSIS — M6281 Muscle weakness (generalized): Secondary | ICD-10-CM | POA: Diagnosis not present

## 2015-09-09 DIAGNOSIS — R1310 Dysphagia, unspecified: Secondary | ICD-10-CM | POA: Diagnosis not present

## 2015-09-09 DIAGNOSIS — E099 Drug or chemical induced diabetes mellitus without complications: Secondary | ICD-10-CM | POA: Diagnosis not present

## 2015-09-09 DIAGNOSIS — M4712 Other spondylosis with myelopathy, cervical region: Secondary | ICD-10-CM | POA: Diagnosis not present

## 2015-09-09 DIAGNOSIS — M6281 Muscle weakness (generalized): Secondary | ICD-10-CM | POA: Diagnosis not present

## 2015-09-09 DIAGNOSIS — M5137 Other intervertebral disc degeneration, lumbosacral region: Secondary | ICD-10-CM | POA: Diagnosis not present

## 2015-09-09 DIAGNOSIS — Z4789 Encounter for other orthopedic aftercare: Secondary | ICD-10-CM | POA: Diagnosis not present

## 2015-09-13 DIAGNOSIS — R1310 Dysphagia, unspecified: Secondary | ICD-10-CM | POA: Diagnosis not present

## 2015-09-13 DIAGNOSIS — M6281 Muscle weakness (generalized): Secondary | ICD-10-CM | POA: Diagnosis not present

## 2015-09-13 DIAGNOSIS — M4712 Other spondylosis with myelopathy, cervical region: Secondary | ICD-10-CM | POA: Diagnosis not present

## 2015-09-13 DIAGNOSIS — M5137 Other intervertebral disc degeneration, lumbosacral region: Secondary | ICD-10-CM | POA: Diagnosis not present

## 2015-09-13 DIAGNOSIS — E099 Drug or chemical induced diabetes mellitus without complications: Secondary | ICD-10-CM | POA: Diagnosis not present

## 2015-09-13 DIAGNOSIS — Z4789 Encounter for other orthopedic aftercare: Secondary | ICD-10-CM | POA: Diagnosis not present

## 2015-09-15 DIAGNOSIS — M5137 Other intervertebral disc degeneration, lumbosacral region: Secondary | ICD-10-CM | POA: Diagnosis not present

## 2015-09-15 DIAGNOSIS — M6281 Muscle weakness (generalized): Secondary | ICD-10-CM | POA: Diagnosis not present

## 2015-09-15 DIAGNOSIS — R1310 Dysphagia, unspecified: Secondary | ICD-10-CM | POA: Diagnosis not present

## 2015-09-15 DIAGNOSIS — M4712 Other spondylosis with myelopathy, cervical region: Secondary | ICD-10-CM | POA: Diagnosis not present

## 2015-09-15 DIAGNOSIS — E099 Drug or chemical induced diabetes mellitus without complications: Secondary | ICD-10-CM | POA: Diagnosis not present

## 2015-09-15 DIAGNOSIS — Z4789 Encounter for other orthopedic aftercare: Secondary | ICD-10-CM | POA: Diagnosis not present

## 2015-09-17 ENCOUNTER — Other Ambulatory Visit: Payer: Self-pay | Admitting: Family Medicine

## 2015-10-19 ENCOUNTER — Other Ambulatory Visit: Payer: Self-pay | Admitting: Family Medicine

## 2015-10-19 DIAGNOSIS — Z981 Arthrodesis status: Secondary | ICD-10-CM | POA: Diagnosis not present

## 2015-10-19 DIAGNOSIS — M4322 Fusion of spine, cervical region: Secondary | ICD-10-CM | POA: Diagnosis not present

## 2015-10-19 DIAGNOSIS — R200 Anesthesia of skin: Secondary | ICD-10-CM | POA: Diagnosis not present

## 2015-10-19 DIAGNOSIS — Z4789 Encounter for other orthopedic aftercare: Secondary | ICD-10-CM | POA: Diagnosis not present

## 2015-10-19 DIAGNOSIS — M4313 Spondylolisthesis, cervicothoracic region: Secondary | ICD-10-CM | POA: Diagnosis not present

## 2015-10-31 ENCOUNTER — Telehealth: Payer: Self-pay | Admitting: Family Medicine

## 2015-10-31 MED ORDER — OXYCODONE HCL 10 MG PO TABS: 10.0000 mg | ORAL_TABLET | ORAL | Status: DC | PRN

## 2015-10-31 NOTE — Telephone Encounter (Signed)
Pt is aware that RX is up front for pick up. 

## 2015-10-31 NOTE — Telephone Encounter (Signed)
OK to refill

## 2015-10-31 NOTE — Telephone Encounter (Signed)
Pt request refill Oxycodone HCl 10 MG TABS  Pt took her last one this am and has none for tonight. Pt has appointment on Thurs/2/09, but cannot wait until then.  Pt states she usually gets 3 scripts , but since she has appointment on Thurs doesn't know if Dr Elease Hashimoto wants to do that.

## 2015-10-31 NOTE — Telephone Encounter (Signed)
Pt is UTD on appts. Please advise on refill.

## 2015-11-01 ENCOUNTER — Telehealth: Payer: Self-pay | Admitting: Family Medicine

## 2015-11-01 DIAGNOSIS — M0589 Other rheumatoid arthritis with rheumatoid factor of multiple sites: Secondary | ICD-10-CM | POA: Diagnosis not present

## 2015-11-01 DIAGNOSIS — M150 Primary generalized (osteo)arthritis: Secondary | ICD-10-CM | POA: Diagnosis not present

## 2015-11-01 DIAGNOSIS — M5136 Other intervertebral disc degeneration, lumbar region: Secondary | ICD-10-CM | POA: Diagnosis not present

## 2015-11-01 DIAGNOSIS — M810 Age-related osteoporosis without current pathological fracture: Secondary | ICD-10-CM | POA: Diagnosis not present

## 2015-11-01 DIAGNOSIS — M316 Other giant cell arteritis: Secondary | ICD-10-CM | POA: Diagnosis not present

## 2015-11-01 DIAGNOSIS — N184 Chronic kidney disease, stage 4 (severe): Secondary | ICD-10-CM | POA: Diagnosis not present

## 2015-11-01 MED ORDER — OXYCODONE HCL ER 10 MG PO T12A: 10.0000 mg | EXTENDED_RELEASE_TABLET | Freq: Two times a day (BID) | ORAL | Status: DC

## 2015-11-01 NOTE — Telephone Encounter (Signed)
Pt is aware via voicemail that RX is printed and up front for pick up.

## 2015-11-01 NOTE — Telephone Encounter (Signed)
Patient said the prescription she picked up yesterday was Oxycodone but it should have been for Oxycontin.  She said she told the CMA twice that it was Oxycontin not Oxycodone.  She would like her correct prescription for Oxycontin.

## 2015-11-01 NOTE — Telephone Encounter (Signed)
Rx for Extended Release was printed for signature.

## 2015-11-02 DIAGNOSIS — M0589 Other rheumatoid arthritis with rheumatoid factor of multiple sites: Secondary | ICD-10-CM | POA: Diagnosis not present

## 2015-11-03 ENCOUNTER — Ambulatory Visit (INDEPENDENT_AMBULATORY_CARE_PROVIDER_SITE_OTHER): Payer: Medicare Other | Admitting: Family Medicine

## 2015-11-03 VITALS — BP 142/90 | HR 96 | Temp 98.5°F | Ht 61.5 in | Wt 160.2 lb

## 2015-11-03 DIAGNOSIS — E785 Hyperlipidemia, unspecified: Secondary | ICD-10-CM

## 2015-11-03 DIAGNOSIS — I10 Essential (primary) hypertension: Secondary | ICD-10-CM

## 2015-11-03 DIAGNOSIS — M810 Age-related osteoporosis without current pathological fracture: Secondary | ICD-10-CM

## 2015-11-03 DIAGNOSIS — M549 Dorsalgia, unspecified: Secondary | ICD-10-CM | POA: Diagnosis not present

## 2015-11-03 DIAGNOSIS — G8929 Other chronic pain: Secondary | ICD-10-CM | POA: Diagnosis not present

## 2015-11-03 MED ORDER — OXYCODONE HCL ER 10 MG PO T12A: 10.0000 mg | EXTENDED_RELEASE_TABLET | Freq: Two times a day (BID) | ORAL | Status: DC

## 2015-11-03 NOTE — Progress Notes (Signed)
Pre visit review using our clinic review tool, if applicable. No additional management support is needed unless otherwise documented below in the visit note. 

## 2015-11-03 NOTE — Progress Notes (Signed)
Subjective:    Patient ID: [name], female    DOB: 01/19/1941, 75 y.o.   MRN: [id number]  HPI  patient seen for medical follow-up. She has chronic problems including chronic back pain, history of breast cancer , reported osteoporosis , history of CVA , temporal arteritis , chronic venous insufficiency. She had cervical neck surgery back in November and has improved since then. She has less neck pain. She has chronic low back pain. She has been on regimen of OxyContin 10 mg twice daily for many years. No recent constipation issues. She rarely supplements with oxycodone 10 mg.   osteoporosis history. She had DEXA scan October 2015 with T score -2.4 the hip. She takes calcium and vitamin D daily. No recent falls.   Hypertension treated with amlodipine. No dizziness. No headaches. No chest pains. She takes regular furosemide along with potassium supplement. No recent edema issues.   Temporal arteritis followed by neurology. Currently on prednisone 6 mg daily. Hyperlipidemia treated with low-dose simvastatin 5 mg daily.  Past Medical History  Diagnosis Date  . History of breast cancer   . Osteoporosis   . Idiopathic pulmonary fibrosis   . Bronchiectasis   . Vitamin D deficiency   . Hyperlipidemia   . Allergic rhinitis   . GERD (gastroesophageal reflux disease)   . Left knee DJD   . Anxiety   . Migraine   . Diverticulosis of colon   . Seizure disorder ([place])   . Temporal arteritis (HCC)     Right eye blind, on steroids per [facility]/ Dr [name]  . CVA (cerebral infarction) 10/2007    Right thalamic   . PMR (polymyalgia rheumatica) (HCC)   . Anemia   . Gait disorder   . Peripheral neuropathy ([place])   . HTN (hypertension)   . Type II or unspecified type diabetes mellitus without mention of complication, not stated as uncontrolled     2nd to steriods  . Renal insufficiency   . Previous back surgery 10/09/12  . Polyneuropathy in other diseases classified elsewhere ([place]) 02/17/2013    . Carpal tunnel syndrome 07/13/2015    Bilateral  . Spondylosis, cervical, with myelopathy 08/31/2015    C3-4 myelopathy   Past Surgical History  Procedure Laterality Date  . Mastectomy    . Cholecystectomy    . Total knee arthroplasty      Left  . Tonsillectomy    . Cataract extraction      OS - Summer 2010  . Lumbar fusion  04/2010    W/Mechanical fixation - [facility]  . Spinal fusion  07/2010    T10-L2 interbody fusion / [facility]  . Colonoscopy  12/15/2011    Procedure: COLONOSCOPY;  Surgeon: [name], MD;  Location: [facility];  Service: Endoscopy;  Laterality: N/A;  . Carpal tunnel release Right   . Cervical fusion  2010    4 rods and pins in place  . Lumbar fusion  2013    rods in hips (to stabilize).  . Rheumatoid nodule removal    . Neck surgery      reports that she has never smoked. She has never used smokeless tobacco. She reports that she does not drink alcohol or use illicit drugs. family history includes Brain cancer in her mother; Breast cancer in her sister; Dementia in her father; Hypertension in her father and mother; Multiple sclerosis in her sister. Allergies  Allergen Reactions  . Hydromorphone Other (See Comments)    Cognitive  changes       Review of Systems  Constitutional: Negative for fatigue.  Eyes: Negative for visual disturbance.  Respiratory: Negative for cough, chest tightness, shortness of breath and wheezing.   Cardiovascular: Negative for chest pain, palpitations and leg swelling.  Gastrointestinal: Negative for abdominal pain.  Endocrine: Negative for polydipsia and polyuria.  Genitourinary: Negative for dysuria.  Musculoskeletal: Positive for back pain.  Neurological: Negative for dizziness, seizures, syncope, weakness, light-headedness and headaches.       Objective:   Physical Exam  Constitutional: She is oriented to person, place, and time. She appears well-developed and well-nourished.  Neck: Neck  supple. No JVD present.  Cardiovascular: Normal rate and regular rhythm.   Pulmonary/Chest: Effort normal and breath sounds normal. No respiratory distress. She has no wheezes. She has no rales.  Musculoskeletal: She exhibits no edema.  Neurological: She is alert and oriented to person, place, and time.  Psychiatric: She has a normal mood and affect. Her behavior is normal.          Assessment & Plan:   #1 chronic back pain. Refill OxyContin 10 mg twice daily through early May.   #2 hypertension. Borderline high today. This has generally been well controlled. Monitor closely and be in touch if consistently greater than 150/90   #3 hyperlipidemia. Continue simvastatin. Recheck lipid and hepatic panel at follow-up in 3 months   #4 history of osteoporosis. Continue regular calcium and vitamin D. She is on Prolia injections. Consider repeat DEXA scan by next October

## 2015-11-07 DIAGNOSIS — H524 Presbyopia: Secondary | ICD-10-CM | POA: Diagnosis not present

## 2015-11-07 DIAGNOSIS — H4720 Unspecified optic atrophy: Secondary | ICD-10-CM | POA: Diagnosis not present

## 2015-11-07 DIAGNOSIS — H43813 Vitreous degeneration, bilateral: Secondary | ICD-10-CM | POA: Diagnosis not present

## 2015-11-07 DIAGNOSIS — H04123 Dry eye syndrome of bilateral lacrimal glands: Secondary | ICD-10-CM | POA: Diagnosis not present

## 2015-11-23 ENCOUNTER — Other Ambulatory Visit: Payer: Self-pay | Admitting: Adult Health

## 2015-12-05 DIAGNOSIS — M810 Age-related osteoporosis without current pathological fracture: Secondary | ICD-10-CM | POA: Diagnosis not present

## 2015-12-14 ENCOUNTER — Other Ambulatory Visit: Payer: Self-pay | Admitting: Family Medicine

## 2015-12-19 ENCOUNTER — Telehealth: Payer: Self-pay | Admitting: Family Medicine

## 2015-12-19 NOTE — Telephone Encounter (Signed)
I think that would be OK as long as she is disciplined to keep at 4 ounces.

## 2015-12-19 NOTE — Telephone Encounter (Signed)
Pt would like to know if it is ok for her to have 4 oz of red wine a night with the medicine she takes?

## 2015-12-19 NOTE — Telephone Encounter (Signed)
Pt is aware via voicemail. 

## 2015-12-27 ENCOUNTER — Telehealth: Payer: Self-pay | Admitting: Family Medicine

## 2015-12-27 NOTE — Telephone Encounter (Signed)
Pt cancel said she is going to see her rheumto doctor tomorrow and the yare going to do the sed rate

## 2015-12-27 NOTE — Telephone Encounter (Signed)
Pt call to ask if she can get a sed rate done .she is having eye issues. She wants to get this done today.   She is also asking if she should take a certain amount of predizone.

## 2015-12-27 NOTE — Telephone Encounter (Signed)
FYI

## 2015-12-28 DIAGNOSIS — M0589 Other rheumatoid arthritis with rheumatoid factor of multiple sites: Secondary | ICD-10-CM | POA: Diagnosis not present

## 2015-12-28 DIAGNOSIS — Z79899 Other long term (current) drug therapy: Secondary | ICD-10-CM | POA: Diagnosis not present

## 2016-01-18 DIAGNOSIS — H04122 Dry eye syndrome of left lacrimal gland: Secondary | ICD-10-CM | POA: Diagnosis not present

## 2016-01-18 DIAGNOSIS — H5712 Ocular pain, left eye: Secondary | ICD-10-CM | POA: Diagnosis not present

## 2016-01-31 ENCOUNTER — Ambulatory Visit (INDEPENDENT_AMBULATORY_CARE_PROVIDER_SITE_OTHER): Payer: Medicare Other | Admitting: Family Medicine

## 2016-01-31 ENCOUNTER — Ambulatory Visit: Payer: Medicare Other | Admitting: Family Medicine

## 2016-01-31 VITALS — BP 100/70 | HR 73 | Temp 98.3°F | Ht 61.5 in | Wt 162.8 lb

## 2016-01-31 DIAGNOSIS — G8929 Other chronic pain: Secondary | ICD-10-CM

## 2016-01-31 DIAGNOSIS — M549 Dorsalgia, unspecified: Secondary | ICD-10-CM | POA: Diagnosis not present

## 2016-01-31 DIAGNOSIS — E785 Hyperlipidemia, unspecified: Secondary | ICD-10-CM

## 2016-01-31 DIAGNOSIS — I10 Essential (primary) hypertension: Secondary | ICD-10-CM

## 2016-01-31 LAB — COMPREHENSIVE METABOLIC PANEL
ALT: 21 U/L (ref 0–35)
AST: 24 U/L (ref 0–37)
Albumin: 4 g/dL (ref 3.5–5.2)
Alkaline Phosphatase: 68 U/L (ref 39–117)
BILIRUBIN TOTAL: 1.1 mg/dL (ref 0.2–1.2)
BUN: 21 mg/dL (ref 6–23)
CO2: 34 meq/L — AB (ref 19–32)
CREATININE: 1.22 mg/dL — AB (ref 0.40–1.20)
Calcium: 9.3 mg/dL (ref 8.4–10.5)
Chloride: 102 mEq/L (ref 96–112)
GFR: 45.58 mL/min — ABNORMAL LOW (ref >60.00)
GLUCOSE: 90 mg/dL (ref 70–99)
Potassium: 4.2 mEq/L (ref 3.5–5.1)
Sodium: 143 mEq/L (ref 135–145)
TOTAL PROTEIN: 6.6 g/dL (ref 6.0–8.3)

## 2016-01-31 LAB — LIPID PANEL
CHOL/HDL RATIO: 2
CHOLESTEROL: 152 mg/dL (ref 0–200)
HDL: 64.1 mg/dL (ref >39.00)
LDL Cholesterol: 67 mg/dL (ref 0–99)
NONHDL: 87.86
Triglycerides: 104 mg/dL (ref 0.0–149.0)
VLDL: 20.8 mg/dL (ref 0.0–40.0)

## 2016-01-31 MED ORDER — OXYCODONE HCL ER 10 MG PO T12A: 10.0000 mg | EXTENDED_RELEASE_TABLET | Freq: Two times a day (BID) | ORAL | Status: DC

## 2016-01-31 MED ORDER — OXYCODONE HCL 10 MG PO TABS: 10.0000 mg | ORAL_TABLET | ORAL | Status: DC | PRN

## 2016-01-31 NOTE — Progress Notes (Signed)
Subjective:    Patient ID: [name], female    DOB: November 12, 1940, 75 y.o.   MRN: [id number]  HPI Follow-up multiple medical problems  Hypertension. This has been well controlled on amlodipine. No recent dizziness. No chest pains. She has occasional dyspnea at rest but she thinks this is more of an anxiety. She has not had any dyspnea whatsoever with exertion  Hyperlipidemia. Treated with simvastatin. Overdue for lipids. Compliant with therapy. No history of CAD or peripheral vascular disease.  Chronic low back pain. She's had 3 lumbar back surgeries and 2 cervical neck surgeries. She's been on OxyContin 10 mg twice daily for several years. No recent constipation issues. Even with medication pain is frequently 7 out of 10 but she is reluctant to go up on medication this time. She's had epidural injections without improvement. She tried several conservative therapies including Tylenol without relief. She is satisfied with her current pain treatment regimen. This allows her functionally to get around the home and accomplish basic ADLs  Past Medical History  Diagnosis Date  . History of breast cancer   . Osteoporosis   . Idiopathic pulmonary fibrosis   . Bronchiectasis   . Vitamin D deficiency   . Hyperlipidemia   . Allergic rhinitis   . GERD (gastroesophageal reflux disease)   . Left knee DJD   . Anxiety   . Migraine   . Diverticulosis of colon   . Seizure disorder ([place])   . Temporal arteritis (HCC)     Right eye blind, on steroids per [facility]/ Dr [name]  . CVA (cerebral infarction) 10/2007    Right thalamic   . PMR (polymyalgia rheumatica) (HCC)   . Anemia   . Gait disorder   . Peripheral neuropathy ([place])   . HTN (hypertension)   . Type II or unspecified type diabetes mellitus without mention of complication, not stated as uncontrolled     2nd to steriods  . Renal insufficiency   . Previous back surgery 10/09/12  . Polyneuropathy in other diseases classified elsewhere  ([place]) 02/17/2013  . Carpal tunnel syndrome 07/13/2015    Bilateral  . Spondylosis, cervical, with myelopathy 08/31/2015    C3-4 myelopathy   Past Surgical History  Procedure Laterality Date  . Mastectomy    . Cholecystectomy    . Total knee arthroplasty      Left  . Tonsillectomy    . Cataract extraction      OS - Summer 2010  . Lumbar fusion  04/2010    W/Mechanical fixation - [facility]  . Spinal fusion  07/2010    T10-L2 interbody fusion / [facility]  . Colonoscopy  12/15/2011    Procedure: COLONOSCOPY;  Surgeon: [name], MD;  Location: [facility];  Service: Endoscopy;  Laterality: N/A;  . Carpal tunnel release Right   . Cervical fusion  2010    4 rods and pins in place  . Lumbar fusion  2013    rods in hips (to stabilize).  . Rheumatoid nodule removal    . Neck surgery      reports that she has never smoked. She has never used smokeless tobacco. She reports that she does not drink alcohol or use illicit drugs. family history includes Brain cancer in her mother; Breast cancer in her sister; Dementia in her father; Hypertension in her father and mother; Multiple sclerosis in her sister. Allergies  Allergen Reactions  . Hydromorphone Other (See Comments)    Cognitive changes  Review of Systems  Constitutional: Negative for fatigue and unexpected weight change.  Eyes: Negative for visual disturbance.  Respiratory: Negative for cough, chest tightness, shortness of breath and wheezing.   Cardiovascular: Negative for chest pain, palpitations and leg swelling.  Endocrine: Negative for polydipsia and polyuria.  Genitourinary: Negative for dysuria.  Musculoskeletal: Positive for back pain.  Neurological: Negative for dizziness, seizures, syncope, weakness, light-headedness and headaches.       Objective:   Physical Exam  Constitutional: She appears well-developed and well-nourished.  Eyes: Pupils are equal, round, and reactive to light.  Neck:  Neck supple. No JVD present. No thyromegaly present.  Cardiovascular: Normal rate and regular rhythm.  Exam reveals no gallop.   Pulmonary/Chest: Effort normal and breath sounds normal. No respiratory distress. She has no wheezes. She has no rales.  Musculoskeletal: She exhibits no edema.  Neurological: She is alert.          Assessment & Plan:  #1 hypertension. Stable and at goal. Continue amlodipine  #2 dyslipidemia. Recheck lipid and hepatic panel   #3 chronic back pain. Refill OxyContin for 3 months. She infrequently uses oxycodone 10 mg immediate release for breakthrough pain and was given one refill of this. No adverse side effects. No history of misuse. Routine follow-up 3 months  Eulas Post MD [facility]

## 2016-01-31 NOTE — Progress Notes (Signed)
Pre visit review using our clinic review tool, if applicable. No additional management support is needed unless otherwise documented below in the visit note. 

## 2016-02-02 ENCOUNTER — Ambulatory Visit: Payer: Medicare Other | Admitting: Family Medicine

## 2016-02-02 DIAGNOSIS — M810 Age-related osteoporosis without current pathological fracture: Secondary | ICD-10-CM | POA: Diagnosis not present

## 2016-02-02 DIAGNOSIS — M0589 Other rheumatoid arthritis with rheumatoid factor of multiple sites: Secondary | ICD-10-CM | POA: Diagnosis not present

## 2016-02-02 DIAGNOSIS — M150 Primary generalized (osteo)arthritis: Secondary | ICD-10-CM | POA: Diagnosis not present

## 2016-02-02 DIAGNOSIS — N184 Chronic kidney disease, stage 4 (severe): Secondary | ICD-10-CM | POA: Diagnosis not present

## 2016-02-02 DIAGNOSIS — M316 Other giant cell arteritis: Secondary | ICD-10-CM | POA: Diagnosis not present

## 2016-02-02 DIAGNOSIS — M5136 Other intervertebral disc degeneration, lumbar region: Secondary | ICD-10-CM | POA: Diagnosis not present

## 2016-02-03 ENCOUNTER — Other Ambulatory Visit: Payer: Self-pay | Admitting: Adult Health

## 2016-02-03 NOTE — Telephone Encounter (Signed)
LMVM for pt that calling about gabapentin refill.  Has appt with Dr. [name]  02-29-16.

## 2016-02-07 NOTE — Telephone Encounter (Signed)
Madell Littler [id number] SAME WILLIS [phone] * [phone] SANDY:RETURNING CALL, HAS ENOUGH GABAPENTIN UNTIL THE APPT N   Operator called pt back, she has enough to last until her appt.

## 2016-02-17 DIAGNOSIS — I129 Hypertensive chronic kidney disease with stage 1 through stage 4 chronic kidney disease, or unspecified chronic kidney disease: Secondary | ICD-10-CM | POA: Diagnosis not present

## 2016-02-17 DIAGNOSIS — R600 Localized edema: Secondary | ICD-10-CM | POA: Diagnosis not present

## 2016-02-17 DIAGNOSIS — M4802 Spinal stenosis, cervical region: Secondary | ICD-10-CM | POA: Diagnosis not present

## 2016-02-17 DIAGNOSIS — J479 Bronchiectasis, uncomplicated: Secondary | ICD-10-CM | POA: Diagnosis not present

## 2016-02-17 DIAGNOSIS — N179 Acute kidney failure, unspecified: Secondary | ICD-10-CM | POA: Diagnosis not present

## 2016-02-17 DIAGNOSIS — M069 Rheumatoid arthritis, unspecified: Secondary | ICD-10-CM | POA: Diagnosis not present

## 2016-02-17 DIAGNOSIS — E669 Obesity, unspecified: Secondary | ICD-10-CM | POA: Diagnosis not present

## 2016-02-17 DIAGNOSIS — N183 Chronic kidney disease, stage 3 (moderate): Secondary | ICD-10-CM | POA: Diagnosis not present

## 2016-02-17 DIAGNOSIS — J8410 Pulmonary fibrosis, unspecified: Secondary | ICD-10-CM | POA: Diagnosis not present

## 2016-02-19 ENCOUNTER — Other Ambulatory Visit: Payer: Self-pay | Admitting: Adult Health

## 2016-02-22 ENCOUNTER — Other Ambulatory Visit: Payer: Self-pay | Admitting: Adult Health

## 2016-02-26 ENCOUNTER — Other Ambulatory Visit: Payer: Self-pay | Admitting: Family Medicine

## 2016-02-29 ENCOUNTER — Ambulatory Visit (INDEPENDENT_AMBULATORY_CARE_PROVIDER_SITE_OTHER): Payer: Medicare Other | Admitting: Neurology

## 2016-02-29 ENCOUNTER — Encounter: Payer: Self-pay | Admitting: Neurology

## 2016-02-29 VITALS — BP 118/66 | HR 72 | Ht 61.5 in | Wt 158.5 lb

## 2016-02-29 DIAGNOSIS — M316 Other giant cell arteritis: Secondary | ICD-10-CM

## 2016-02-29 DIAGNOSIS — G63 Polyneuropathy in diseases classified elsewhere: Secondary | ICD-10-CM | POA: Diagnosis not present

## 2016-02-29 MED ORDER — DULOXETINE HCL 30 MG PO CPEP: 30.0000 mg | ORAL_CAPSULE | Freq: Every day | ORAL | Status: DC

## 2016-02-29 NOTE — Progress Notes (Signed)
Reason for visit: Temporal arteritis  [name] is an 75 y.o. female  History of present illness:  [name] is a 75 year old right-handed white female with history of polymyalgia rheumatica, temporal arteritis, blindness in the right eye. The patient has also had recent issues with a cervical myelopathy, and alteration in gait. The patient has had surgery for this, she has had good improvement, but she has residual numbness in both hands. The patient has had lumbosacral spine surgery as well, she has a chronic left foot drop as a result from this. She has a peripheral neuropathy and a chronic gait disturbance, she uses a walker for ambulation. She slid out of a chair 2 weeks ago, she has had some neck discomfort since the fall, she still has some residual numbness in both hands that is gradually improving. She has chronic difficulties with sleeping, she gets up every 2 hours to use the bathroom. She has numbness and discomfort in the feet that the gabapentin does not completely improve. The gabapentin is currently at a dose of 600 mg twice daily, higher doses result in too much drowsiness. The patient takes 6 mg daily of prednisone, her last sedimentation rate in January 2017 was 18.  Past Medical History  Diagnosis Date  . History of breast cancer   . Osteoporosis   . Idiopathic pulmonary fibrosis   . Bronchiectasis   . Vitamin D deficiency   . Hyperlipidemia   . Allergic rhinitis   . GERD (gastroesophageal reflux disease)   . Left knee DJD   . Anxiety   . Migraine   . Diverticulosis of colon   . Seizure disorder ([place])   . Temporal arteritis (HCC)     Right eye blind, on steroids per [facility]er[facility]/ Dr [name]  . CVA (cerebral infarction) 10/2007    Right thalamic   . PMR (polymyalgia rheumatica) (HCC)   . Anemia   . Gait disorder   . Peripheral neuropathy ([place])   . HTN (hypertension)   . Type II or unspecified type diabetes mellitus without mention of complication, not stated  as uncontrolled     2nd to steriods  . Renal insufficiency   . Previous back surgery 10/09/12  . Polyneuropathy in other diseases classified elsewhere ([place]) 02/17/2013  . Carpal tunnel syndrome 07/13/2015    Bilateral  . Spondylosis, cervical, with myelopathy 08/31/2015    C3-4 myelopathy    Past Surgical History  Procedure Laterality Date  . Mastectomy    . Cholecystectomy    . Total knee arthroplasty      Left  . Tonsillectomy    . Cataract extraction      OS - Summer 2010  . Lumbar fusion  04/2010    W/Mechanical fixation - [facility]  . Spinal fusion  07/2010    T10-L2 interbody fusion / [facility]  . Colonoscopy  12/15/2011    Procedure: COLONOSCOPY;  Surgeon: [name], MD;  Location: [facility];  Service: Endoscopy;  Laterality: N/A;  . Carpal tunnel release Right   . Cervical fusion  2010    4 rods and pins in place  . Lumbar fusion  2013    rods in hips (to stabilize).  . Rheumatoid nodule removal    . Neck surgery      Family History  Problem Relation Age of Onset  . Brain cancer Mother   . Hypertension Mother   . Dementia Father   . Hypertension Father   .  Breast cancer Sister   . Multiple sclerosis Sister     Social history:  reports that she has never smoked. She has never used smokeless tobacco. She reports that she does not drink alcohol or use illicit drugs.    Allergies  Allergen Reactions  . Hydromorphone Other (See Comments)    Cognitive changes     Medications:  Prior to Admission medications   Medication Sig Start Date End Date Taking? Authorizing Provider  amLODipine (NORVASC) 5 MG tablet TAKE 1 TABLET DAILY 12/14/15  Yes Eulas Post, MD  Calcium Carb-Cholecalciferol (CALCIUM CARBONATE-VITAMIN D3 PO) Take 1 tablet by mouth daily   Yes Historical Provider, MD  cholecalciferol (VITAMIN D) 1000 UNITS tablet Take 1,000 Units by mouth daily.     Yes Historical Provider, MD  denosumab (PROLIA) 60 MG/ML SOLN Inject 60 mg  into the skin every 6 (six) months.     Yes Historical Provider, MD  ferrous sulfate 325 (65 FE) MG tablet Take 325 mg by mouth 2 (two) times daily.    Yes Historical Provider, MD  furosemide (LASIX) 40 MG tablet TAKE 1 TABLET DAILY 02/27/16  Yes Eulas Post, MD  gabapentin (NEURONTIN) 300 MG capsule Take 600 mg by mouth 2 (two) times daily.   Yes Historical Provider, MD  Golimumab (Adel ARIA IV) Inject into the vein. Takes infusion every 8 wks.   Yes Historical Provider, MD  Multiple Vitamins-Minerals (MULTIVITAMIN,TX-MINERALS) tablet Take 1 tablet by mouth daily.     Yes Historical Provider, MD  oxyCODONE (OXYCONTIN) 10 mg 12 hr tablet Take 1 tablet (10 mg total) by mouth every 12 (twelve) hours. 01/31/16  Yes Eulas Post, MD  oxyCODONE (OXYCONTIN) 10 mg 12 hr tablet Take 1 tablet (10 mg total) by mouth every 12 (twelve) hours. May refill in 2 months. 01/31/16  Yes Eulas Post, MD  potassium chloride (K-DUR,KLOR-CON) 10 MEQ tablet Take 10 mEq by mouth daily. 03/02/13  Yes Neena Rhymes, MD  predniSONE (DELTASONE) 1 MG tablet TAKE 1 TABLET DAILY WITH BREAKFAST ALONG WITH 5 MG TABLET 02/21/16  Yes Ward Givens, NP  predniSONE (DELTASONE) 5 MG tablet TAKE 1 TABLET DAILY WITH BREAKFAST (ALONG WITH 1 MG TABLETS) 02/22/16  Yes Ward Givens, NP  simvastatin (ZOCOR) 5 MG tablet TAKE 1 TABLET AT BEDTIME 10/19/15  Yes Eulas Post, MD    ROS:  Out of a complete 14 system review of symptoms, the patient complains only of the following symptoms, and all other reviewed systems are negative.  Ringing in the ears Gait disturbance, numbness in the hands  Blood pressure 118/66, pulse 72, height 5' 1.5" (1.562 m), weight 158 lb 8 oz (71.895 kg).  Physical Exam  General: The patient is alert and cooperative at the time of the examination.  Skin: 1-2+ edema below the knees is noted bilaterally.   Neurologic Exam  Mental status: The patient is alert and oriented x 3 at the time of  the examination. The patient has apparent normal recent and remote memory, with an apparently normal attention span and concentration ability.   Cranial nerves: Facial symmetry is present. Speech is normal, no aphasia or dysarthria is noted. Extraocular movements are full. On primary gaze, there is exotropia of the right eye. Visual fields are full.  Motor: The patient has good strength in all 4 extremities.  Sensory examination: Soft touch sensation is symmetric on the face, arms, and legs. There is significant decrease in sensation below the knees bilaterally.  Coordination: The patient has good finger-nose-finger and heel-to-shin bilaterally.  Gait and station: The patient has a slightly wide-based, unsteady gait. The patient uses a walker for ambulation. Tandem gait was not attempted. Romberg is negative. No drift is seen.  Reflexes: Deep tendon reflexes are symmetric, but are depressed.   Assessment/Plan:  1. Temporal arteritis  2. Cervical myelopathy  3. Peripheral neuropathy  4. Gait disturbance  The patient is having ongoing discomfort in the feet associated with her neuropathy. We will try low-dose Cymbalta see if this improves her symptoms. A prescription for the 30 mg tablets was called in. The patient will have a sedimentation rate today, if this remains normal, we will try to reduce her prednisone dose to 5 mg daily. She will follow-up in 6 months. If the hand numbness persists, nerve conduction studies can be done in the future to exclude carpal tunnel syndrome, but the symptoms may be related to the cervical myelopathy.  Jill Alexanders MD 02/29/2016 5:48 PM  [facility] [address] [place], [place] [address]  Phone [phone] Fax [phone]

## 2016-03-01 ENCOUNTER — Telehealth: Payer: Self-pay | Admitting: Neurology

## 2016-03-01 DIAGNOSIS — M0589 Other rheumatoid arthritis with rheumatoid factor of multiple sites: Secondary | ICD-10-CM | POA: Diagnosis not present

## 2016-03-01 LAB — SEDIMENTATION RATE: Sed Rate: 3 mm/hr (ref 0–40)

## 2016-03-01 NOTE — Telephone Encounter (Signed)
I called the patient. The sedimentation rate was 3. She is to go to 5 mg of prednisone daily.

## 2016-03-07 ENCOUNTER — Encounter: Payer: Self-pay | Admitting: *Deleted

## 2016-03-07 ENCOUNTER — Other Ambulatory Visit: Payer: Self-pay | Admitting: Neurology

## 2016-03-08 ENCOUNTER — Telehealth: Payer: Self-pay | Admitting: Family Medicine

## 2016-03-08 NOTE — Telephone Encounter (Signed)
FYI pt is calling to let md know she only takes 1200 mg of gabapentin.

## 2016-03-08 NOTE — Telephone Encounter (Signed)
Recieved faxed request for new prescription for Gabapentin 300mg .  Looks like currently being written by Neuo, however was declined yesterday due to "Pt's dose has been lowered and she has med at home."  Maybe below message from patient this morning was actually meant for Neurology, considering we've never written this medication for the patient.

## 2016-03-09 NOTE — Telephone Encounter (Signed)
Notified pt to contact neurology for any needed refills

## 2016-03-09 NOTE — Telephone Encounter (Signed)
This should be sent to Neurology.  Thanks!

## 2016-04-02 ENCOUNTER — Telehealth: Payer: Self-pay

## 2016-04-02 MED ORDER — GABAPENTIN 300 MG PO CAPS: 600.0000 mg | ORAL_CAPSULE | Freq: Two times a day (BID) | ORAL | Status: DC

## 2016-04-02 NOTE — Telephone Encounter (Signed)
Pt stopped by office this morning. Said that she tried Cymbalta but it made her nervous. Continues taking Gabapentin 600 mg BID. Refills retailed as requested.

## 2016-04-16 ENCOUNTER — Other Ambulatory Visit: Payer: Self-pay | Admitting: Family Medicine

## 2016-04-26 DIAGNOSIS — M0589 Other rheumatoid arthritis with rheumatoid factor of multiple sites: Secondary | ICD-10-CM | POA: Diagnosis not present

## 2016-05-01 DIAGNOSIS — M316 Other giant cell arteritis: Secondary | ICD-10-CM | POA: Diagnosis not present

## 2016-05-01 DIAGNOSIS — M810 Age-related osteoporosis without current pathological fracture: Secondary | ICD-10-CM | POA: Diagnosis not present

## 2016-05-01 DIAGNOSIS — M150 Primary generalized (osteo)arthritis: Secondary | ICD-10-CM | POA: Diagnosis not present

## 2016-05-01 DIAGNOSIS — M0589 Other rheumatoid arthritis with rheumatoid factor of multiple sites: Secondary | ICD-10-CM | POA: Diagnosis not present

## 2016-05-01 DIAGNOSIS — N184 Chronic kidney disease, stage 4 (severe): Secondary | ICD-10-CM | POA: Diagnosis not present

## 2016-05-01 DIAGNOSIS — M5136 Other intervertebral disc degeneration, lumbar region: Secondary | ICD-10-CM | POA: Diagnosis not present

## 2016-05-02 ENCOUNTER — Ambulatory Visit (INDEPENDENT_AMBULATORY_CARE_PROVIDER_SITE_OTHER): Payer: Medicare Other | Admitting: Family Medicine

## 2016-05-02 VITALS — BP 150/78 | HR 86 | Temp 98.2°F | Ht 61.5 in | Wt 160.5 lb

## 2016-05-02 DIAGNOSIS — I10 Essential (primary) hypertension: Secondary | ICD-10-CM

## 2016-05-02 DIAGNOSIS — E785 Hyperlipidemia, unspecified: Secondary | ICD-10-CM

## 2016-05-02 DIAGNOSIS — Z9181 History of falling: Secondary | ICD-10-CM | POA: Diagnosis not present

## 2016-05-02 DIAGNOSIS — M4712 Other spondylosis with myelopathy, cervical region: Secondary | ICD-10-CM

## 2016-05-02 DIAGNOSIS — N182 Chronic kidney disease, stage 2 (mild): Secondary | ICD-10-CM

## 2016-05-02 DIAGNOSIS — N189 Chronic kidney disease, unspecified: Secondary | ICD-10-CM | POA: Diagnosis not present

## 2016-05-02 MED ORDER — OXYCODONE HCL ER 10 MG PO T12A: 10.0000 mg | EXTENDED_RELEASE_TABLET | Freq: Two times a day (BID) | ORAL | 0 refills | Status: DC

## 2016-05-02 NOTE — Progress Notes (Signed)
Subjective:     Patient ID: [name], female   DOB: November 23, 1940, 75 y.o.   MRN: [id number]  HPI Patient seen for routine medical follow-up. Her chronic problems include chronic neck and low back pain. She is on chronic opioid treatment for her pain and has been on this for several years. She also has hypertension, obesity, hyperlipidemia and also history of temporal arteritis followed by rheumatology.  Frequently has felt off balance recently. She's had a couple falls. She uses a walker. Denies any consistent vertigo symptoms. No syncope.  Pain medications working fairly well. No constipation issues. Denies any side effects. History of good compliance with use.  Recent labs were checked including lipid panel and chemistries and these were all stable.  Past Medical History:  Diagnosis Date  . Allergic rhinitis   . Anemia   . Anxiety   . Bronchiectasis   . Carpal tunnel syndrome 07/13/2015   Bilateral  . CVA (cerebral infarction) 10/2007   Right thalamic   . Diverticulosis of colon   . Gait disorder   . GERD (gastroesophageal reflux disease)   . History of breast cancer   . HTN (hypertension)   . Hyperlipidemia   . Idiopathic pulmonary fibrosis   . Left knee DJD   . Migraine   . Osteoporosis   . Peripheral neuropathy ([place])   . PMR (polymyalgia rheumatica) (HCC)   . Polyneuropathy in other diseases classified elsewhere ([place]) 02/17/2013  . Previous back surgery 10/09/12  . Renal insufficiency   . Seizure disorder ([place])   . Spondylosis, cervical, with myelopathy 08/31/2015   C3-4 myelopathy  . Temporal arteritis (HCC)    Right eye blind, on steroids per [facility]/ Dr [name]  . Type II or unspecified type diabetes mellitus without mention of complication, not stated as uncontrolled    2nd to steriods  . Vitamin D deficiency    Past Surgical History:  Procedure Laterality Date  . CARPAL TUNNEL RELEASE Right   . CATARACT EXTRACTION     OS - Summer 2010  . CERVICAL FUSION   2010   4 rods and pins in place  . CHOLECYSTECTOMY    . COLONOSCOPY  12/15/2011   Procedure: COLONOSCOPY;  Surgeon: [name], MD;  [facility];  Service: Endoscopy;  Laterality: N/A;  . LUMBAR FUSION  04/2010   W/Mechanical fixation - [place]  2013   rods in hips (to stabilize).  Marland Kitchen MASTECTOMY    . NECK SURGERY    . rheumatoid nodule removal    . SPINAL FUSION  07/2010   T10-L2 interbody fusion / [facility]  . TONSILLECTOMY    . TOTAL KNEE ARTHROPLASTY     Left    reports that she has never smoked. She has never used smokeless tobacco. She reports that she does not drink alcohol or use drugs. family history includes Brain cancer in her mother; Breast cancer in her sister; Dementia in her father; Hypertension in her father and mother; Multiple sclerosis in her sister. Allergies  Allergen Reactions  . Hydromorphone Other (See Comments)    Cognitive changes      Review of Systems  Constitutional: Negative for appetite change, fatigue and unexpected weight change.  Eyes: Negative for visual disturbance.  Respiratory: Negative for cough, chest tightness, shortness of breath and wheezing.   Cardiovascular: Negative for chest pain, palpitations and leg swelling.  Neurological: Positive for dizziness and light-headedness. Negative for seizures, syncope, weakness and headaches.  Objective:   Physical Exam  Constitutional: She appears well-developed and well-nourished.  HENT:  Mouth/Throat: Oropharynx is clear and moist.  Neck: Neck supple. No thyromegaly present.  Cardiovascular: Normal rate and regular rhythm.   Pulmonary/Chest: Effort normal and breath sounds normal. No respiratory distress. She has no wheezes. She has no rales.  Musculoskeletal: She exhibits no edema.       Assessment:     #1 hypertension. Slightly elevated systolic reading today but well-controlled at home. Continue current medications and monitor  closely  #2 chronic back pain cervical and lumbar  #3 hyperlipidemia  #4 high-risk for falls    Plan:     -Refill OxyContin for 3 months -Set up physical therapy for fall risk reduction and balance training -Plan routine follow-up in 3 months  Eulas Post MD [facility] at [facility]

## 2016-05-02 NOTE — Progress Notes (Signed)
Pre visit review using our clinic review tool, if applicable. No additional management support is needed unless otherwise documented below in the visit note. 

## 2016-05-02 NOTE — Patient Instructions (Signed)
Fall Prevention in the Home  Falls can cause injuries and can affect people from all age groups. There are many simple things that you can do to make your home safe and to help prevent falls. WHAT CAN I DO ON THE OUTSIDE OF MY HOME?  Regularly repair the edges of walkways and driveways and fix any cracks.  Remove high doorway thresholds.  Trim any shrubbery on the main path into your home.  Use bright outdoor lighting.  Clear walkways of debris and clutter, including tools and rocks.  Regularly check that handrails are securely fastened and in good repair. Both sides of any steps should have handrails.  Install guardrails along the edges of any raised decks or porches.  Have leaves, snow, and ice cleared regularly.  Use sand or salt on walkways during winter months.  In the garage, clean up any spills right away, including grease or oil spills. WHAT CAN I DO IN THE BATHROOM?  Use night lights.  Install grab bars by the toilet and in the tub and shower. Do not use towel bars as grab bars.  Use non-skid mats or decals on the floor of the tub or shower.  If you need to sit down while you are in the shower, use a plastic, non-slip stool..  Keep the floor dry. Immediately clean up any water that spills on the floor.  Remove soap buildup in the tub or shower on a regular basis.  Attach bath mats securely with double-sided non-slip rug tape.  Remove throw rugs and other tripping hazards from the floor. WHAT CAN I DO IN THE BEDROOM?  Use night lights.  Make sure that a bedside light is easy to reach.  Do not use oversized bedding that drapes onto the floor.  Have a firm chair that has side arms to use for getting dressed.  Remove throw rugs and other tripping hazards from the floor. WHAT CAN I DO IN THE KITCHEN?   Clean up any spills right away.  Avoid walking on wet floors.  Place frequently used items in easy-to-reach places.  If you need to reach for something  above you, use a sturdy step stool that has a grab bar.  Keep electrical cables out of the way.  Do not use floor polish or wax that makes floors slippery. If you have to use wax, make sure that it is non-skid floor wax.  Remove throw rugs and other tripping hazards from the floor. WHAT CAN I DO IN THE STAIRWAYS?  Do not leave any items on the stairs.  Make sure that there are handrails on both sides of the stairs. Fix handrails that are broken or loose. Make sure that handrails are as long as the stairways.  Check any carpeting to make sure that it is firmly attached to the stairs. Fix any carpet that is loose or worn.  Avoid having throw rugs at the top or bottom of stairways, or secure the rugs with carpet tape to prevent them from moving.  Make sure that you have a light switch at the top of the stairs and the bottom of the stairs. If you do not have them, have them installed. WHAT ARE SOME OTHER FALL PREVENTION TIPS?  Wear closed-toe shoes that fit well and support your feet. Wear shoes that have rubber soles or low heels.  When you use a stepladder, make sure that it is completely opened and that the sides are firmly locked. Have someone hold the ladder while you   are using it. Do not climb a closed stepladder.  Add color or contrast paint or tape to grab bars and handrails in your home. Place contrasting color strips on the first and last steps.  Use mobility aids as needed, such as canes, walkers, scooters, and crutches.  Turn on lights if it is dark. Replace any light bulbs that burn out.  Set up furniture so that there are clear paths. Keep the furniture in the same spot.  Fix any uneven floor surfaces.  Choose a carpet design that does not hide the edge of steps of a stairway.  Be aware of any and all pets.  Review your medicines with your healthcare provider. Some medicines can cause dizziness or changes in blood pressure, which increase your risk of falling. Talk  with your health care provider about other ways that you can decrease your risk of falls. This may include working with a physical therapist or trainer to improve your strength, balance, and endurance.   This information is not intended to replace advice given to you by your health care provider. Make sure you discuss any questions you have with your health care provider.   Document Released: 08/31/2002 Document Revised: 01/25/2015 Document Reviewed: 10/15/2014 Elsevier Interactive Patient Education 2016 [employer]  

## 2016-05-03 DIAGNOSIS — Z0100 Encounter for examination of eyes and vision without abnormal findings: Secondary | ICD-10-CM | POA: Diagnosis not present

## 2016-05-03 DIAGNOSIS — H4720 Unspecified optic atrophy: Secondary | ICD-10-CM | POA: Diagnosis not present

## 2016-05-03 DIAGNOSIS — H04123 Dry eye syndrome of bilateral lacrimal glands: Secondary | ICD-10-CM | POA: Diagnosis not present

## 2016-05-03 DIAGNOSIS — Z961 Presence of intraocular lens: Secondary | ICD-10-CM | POA: Diagnosis not present

## 2016-05-22 ENCOUNTER — Other Ambulatory Visit: Payer: Self-pay | Admitting: Adult Health

## 2016-05-31 ENCOUNTER — Other Ambulatory Visit: Payer: Self-pay | Admitting: *Deleted

## 2016-05-31 MED ORDER — DICLOFENAC SODIUM 1 % TD GEL: 2.0000 g | Freq: Four times a day (QID) | TRANSDERMAL | 3 refills | Status: DC

## 2016-05-31 NOTE — Telephone Encounter (Signed)
Received request for refill on voltaren gel 1% from [facility].  [phone]  (ordered 06-29-2014 initially). I spoke to pt and she had received 4 tubes initially and is now finally finisihing this.   Has worked for her.

## 2016-06-06 DIAGNOSIS — M0589 Other rheumatoid arthritis with rheumatoid factor of multiple sites: Secondary | ICD-10-CM | POA: Diagnosis not present

## 2016-06-08 DIAGNOSIS — M810 Age-related osteoporosis without current pathological fracture: Secondary | ICD-10-CM | POA: Diagnosis not present

## 2016-06-11 ENCOUNTER — Other Ambulatory Visit: Payer: Self-pay | Admitting: Family Medicine

## 2016-06-11 NOTE — Telephone Encounter (Signed)
rx sent to pharmacy 90 tab no refills, Pt needs to come back in 2 months for OV and Labs.

## 2016-06-13 DIAGNOSIS — Z853 Personal history of malignant neoplasm of breast: Secondary | ICD-10-CM | POA: Diagnosis not present

## 2016-06-13 DIAGNOSIS — Z1231 Encounter for screening mammogram for malignant neoplasm of breast: Secondary | ICD-10-CM | POA: Diagnosis not present

## 2016-06-13 LAB — HM MAMMOGRAPHY: HM MAMMO: NORMAL (ref 0–4)

## 2016-06-15 ENCOUNTER — Encounter: Payer: Self-pay | Admitting: Family Medicine

## 2016-06-25 DIAGNOSIS — M1712 Unilateral primary osteoarthritis, left knee: Secondary | ICD-10-CM | POA: Diagnosis not present

## 2016-07-06 DIAGNOSIS — M0589 Other rheumatoid arthritis with rheumatoid factor of multiple sites: Secondary | ICD-10-CM | POA: Diagnosis not present

## 2016-07-06 DIAGNOSIS — Z79899 Other long term (current) drug therapy: Secondary | ICD-10-CM | POA: Diagnosis not present

## 2016-07-06 LAB — BASIC METABOLIC PANEL
BUN: 19 mg/dL (ref 4–21)
CREATININE: 1.2 mg/dL — AB (ref 0.5–1.1)
GLUCOSE: 93 mg/dL
POTASSIUM: 4.1 mmol/L (ref 3.4–5.3)
Sodium: 145 mmol/L (ref 137–147)

## 2016-07-06 LAB — CBC AND DIFFERENTIAL
HEMATOCRIT: 41 % (ref 36–46)
HEMOGLOBIN: 13.8 g/dL (ref 12.0–16.0)
NEUTROS ABS: 8 /uL
PLATELETS: 260 10*3/uL (ref 150–399)
WBC: 10.5 10*3/mL

## 2016-07-06 LAB — HEPATIC FUNCTION PANEL
ALT: 27 U/L (ref 7–35)
AST: 34 U/L (ref 13–35)
Alkaline Phosphatase: 96 U/L (ref 25–125)
Bilirubin, Total: 0.7 mg/dL

## 2016-07-12 DIAGNOSIS — M1711 Unilateral primary osteoarthritis, right knee: Secondary | ICD-10-CM | POA: Diagnosis not present

## 2016-07-15 ENCOUNTER — Other Ambulatory Visit: Payer: Self-pay | Admitting: Family Medicine

## 2016-07-18 DIAGNOSIS — Z981 Arthrodesis status: Secondary | ICD-10-CM | POA: Diagnosis not present

## 2016-07-18 DIAGNOSIS — M545 Low back pain: Secondary | ICD-10-CM | POA: Diagnosis not present

## 2016-07-18 DIAGNOSIS — M542 Cervicalgia: Secondary | ICD-10-CM | POA: Diagnosis not present

## 2016-07-18 DIAGNOSIS — M40294 Other kyphosis, thoracic region: Secondary | ICD-10-CM | POA: Diagnosis not present

## 2016-07-18 DIAGNOSIS — R29890 Loss of height: Secondary | ICD-10-CM | POA: Diagnosis not present

## 2016-07-18 DIAGNOSIS — Z87311 Personal history of (healed) other pathological fracture: Secondary | ICD-10-CM | POA: Diagnosis not present

## 2016-07-18 DIAGNOSIS — M438X6 Other specified deforming dorsopathies, lumbar region: Secondary | ICD-10-CM | POA: Diagnosis not present

## 2016-07-25 DIAGNOSIS — I129 Hypertensive chronic kidney disease with stage 1 through stage 4 chronic kidney disease, or unspecified chronic kidney disease: Secondary | ICD-10-CM | POA: Diagnosis not present

## 2016-07-25 DIAGNOSIS — N179 Acute kidney failure, unspecified: Secondary | ICD-10-CM | POA: Diagnosis not present

## 2016-07-25 DIAGNOSIS — J8410 Pulmonary fibrosis, unspecified: Secondary | ICD-10-CM | POA: Diagnosis not present

## 2016-07-25 DIAGNOSIS — M069 Rheumatoid arthritis, unspecified: Secondary | ICD-10-CM | POA: Diagnosis not present

## 2016-07-25 DIAGNOSIS — J479 Bronchiectasis, uncomplicated: Secondary | ICD-10-CM | POA: Diagnosis not present

## 2016-07-25 DIAGNOSIS — M4802 Spinal stenosis, cervical region: Secondary | ICD-10-CM | POA: Diagnosis not present

## 2016-07-25 DIAGNOSIS — R600 Localized edema: Secondary | ICD-10-CM | POA: Diagnosis not present

## 2016-07-25 DIAGNOSIS — Z23 Encounter for immunization: Secondary | ICD-10-CM | POA: Diagnosis not present

## 2016-07-25 DIAGNOSIS — N183 Chronic kidney disease, stage 3 (moderate): Secondary | ICD-10-CM | POA: Diagnosis not present

## 2016-07-25 DIAGNOSIS — E669 Obesity, unspecified: Secondary | ICD-10-CM | POA: Diagnosis not present

## 2016-07-27 ENCOUNTER — Telehealth: Payer: Self-pay | Admitting: Neurology

## 2016-07-27 NOTE — Telephone Encounter (Signed)
Blood work done on 07/25/2016 through nephrology reveals a BUN of 22, creatinine 1.21, GFR is 44. Sodium 140, potassium 4.0, total protein 6.3, albumin 4.1, liver profile is unremarkable. White blood count 9.2, hemoglobin 13.7, hematocrit 40.0, platelets of 231.

## 2016-07-31 ENCOUNTER — Ambulatory Visit (INDEPENDENT_AMBULATORY_CARE_PROVIDER_SITE_OTHER): Payer: Medicare Other | Admitting: Family Medicine

## 2016-07-31 VITALS — BP 150/80 | HR 94 | Temp 98.0°F | Ht 61.5 in | Wt 158.2 lb

## 2016-07-31 DIAGNOSIS — M810 Age-related osteoporosis without current pathological fracture: Secondary | ICD-10-CM | POA: Diagnosis not present

## 2016-07-31 DIAGNOSIS — J8410 Pulmonary fibrosis, unspecified: Secondary | ICD-10-CM

## 2016-07-31 DIAGNOSIS — M545 Low back pain, unspecified: Secondary | ICD-10-CM

## 2016-07-31 DIAGNOSIS — I10 Essential (primary) hypertension: Secondary | ICD-10-CM | POA: Diagnosis not present

## 2016-07-31 DIAGNOSIS — G8929 Other chronic pain: Secondary | ICD-10-CM

## 2016-07-31 DIAGNOSIS — M542 Cervicalgia: Secondary | ICD-10-CM

## 2016-07-31 MED ORDER — OXYCODONE HCL ER 10 MG PO T12A: 10.0000 mg | EXTENDED_RELEASE_TABLET | Freq: Two times a day (BID) | ORAL | 0 refills | Status: DC

## 2016-07-31 NOTE — Progress Notes (Signed)
Subjective:     Patient ID: [name], female   DOB: 1941/06/03, 75 y.o.   MRN: [id number]  HPI Patient seen for chronic medical follow-up. She has history of chronic cervical neck pain and low back pain with known history of severe spondylosis.  Has been on chronic opioids with OxyContin 10 mg twice daily for many years. She infrequently takes oxycodone for breakthrough pain.  Other medical problems include history of temporal arteritis, hypertension, hyperlipidemia. She also has osteoporosis and is on Prolia injections every 6 months per rheumatology.  She is still taking low-dose prednisone 5 mg daily and followed by neurology  She had a headache last week which was left frontal area and radiated toward the temporal area. No temporal artery tenderness and no acute visual changes. Headache is better today. She feels may been sinus related.  Patient takes amlodipine for hypertension. Compliant with therapy. Not monitoring blood pressures regularly at home. No recent falls. No chest pains.  She has reported history of pulmonary fibrosis. She occasionally has mild dyspnea at rest which she thinks is more anxiety related. No associated chest pain. Has not had any recent exertional chest pain or shortness of breath.  Past Medical History:  Diagnosis Date  . Allergic rhinitis   . Anemia   . Anxiety   . Bronchiectasis   . Carpal tunnel syndrome 07/13/2015   Bilateral  . CVA (cerebral infarction) 10/2007   Right thalamic   . Diverticulosis of colon   . Gait disorder   . GERD (gastroesophageal reflux disease)   . History of breast cancer   . HTN (hypertension)   . Hyperlipidemia   . Idiopathic pulmonary fibrosis   . Left knee DJD   . Migraine   . Osteoporosis   . Peripheral neuropathy ([place])   . PMR (polymyalgia rheumatica) (HCC)   . Polyneuropathy in other diseases classified elsewhere ([place]) 02/17/2013  . Previous back surgery 10/09/12  . Renal insufficiency   . Seizure disorder  ([place])   . Spondylosis, cervical, with myelopathy 08/31/2015   C3-4 myelopathy  . Temporal arteritis (HCC)    Right eye blind, on steroids per [facility]/ Dr [name]  . Type II or unspecified type diabetes mellitus without mention of complication, not stated as uncontrolled    2nd to steriods  . Vitamin D deficiency    Past Surgical History:  Procedure Laterality Date  . CARPAL TUNNEL RELEASE Right   . CATARACT EXTRACTION     OS - Summer 2010  . CERVICAL FUSION  2010   4 rods and pins in place  . CHOLECYSTECTOMY    . COLONOSCOPY  12/15/2011   Procedure: COLONOSCOPY;  Surgeon: [name], MD;  [facility];  Service: Endoscopy;  Laterality: N/A;  . LUMBAR FUSION  04/2010   W/Mechanical fixation - [place]  2013   rods in hips (to stabilize).  Marland Kitchen MASTECTOMY    . NECK SURGERY    . rheumatoid nodule removal    . SPINAL FUSION  07/2010   T10-L2 interbody fusion / [facility]  . TONSILLECTOMY    . TOTAL KNEE ARTHROPLASTY     Left    reports that she has never smoked. She has never used smokeless tobacco. She reports that she does not drink alcohol or use drugs. family history includes Brain cancer in her mother; Breast cancer in her sister; Dementia in her father; Hypertension in her father and mother; Multiple sclerosis in her sister.  Allergies  Allergen Reactions  . Hydromorphone Other (See Comments)    Cognitive changes      Review of Systems  Constitutional: Negative for fever.  Respiratory: Negative for cough and wheezing.   Cardiovascular: Negative for chest pain.  Genitourinary: Negative for dysuria.  Musculoskeletal: Positive for arthralgias, back pain, neck pain and neck stiffness.  Neurological: Negative for dizziness and weakness.  Psychiatric/Behavioral: Negative for confusion.       Objective:   Physical Exam  Constitutional: She is oriented to person, place, and time. She appears well-developed and well-nourished. No  distress.  Neck: Neck supple. No thyromegaly present.  Cardiovascular: Normal rate and regular rhythm.   Pulmonary/Chest: Effort normal and breath sounds normal. No respiratory distress. She has no wheezes. She has no rales.  Musculoskeletal: She exhibits no edema.  Neurological: She is alert and oriented to person, place, and time.  Psychiatric: She has a normal mood and affect. Her behavior is normal.       Assessment:     #1 hypertension. Generally well-controlled slightly elevated today  #2 chronic back pain with chronic cervicalgia and low back pain  #3 history of temporal arteritis  #4 osteoporosis  #5 hyperlipidemia    Plan:     -Refill OxyContin for 3 months -Flu vaccine already given -Monitor blood pressure closely at home over the next few weeks and be in touch if consistently greater than 150/90  Eulas Post MD [facility]

## 2016-07-31 NOTE — Patient Instructions (Signed)
Monitor blood pressure and be in touch if consistently > 150/90 

## 2016-07-31 NOTE — Progress Notes (Signed)
Pre visit review using our clinic review tool, if applicable. No additional management support is needed unless otherwise documented below in the visit note. 

## 2016-08-20 ENCOUNTER — Other Ambulatory Visit: Payer: Self-pay | Admitting: Neurology

## 2016-08-21 ENCOUNTER — Ambulatory Visit (INDEPENDENT_AMBULATORY_CARE_PROVIDER_SITE_OTHER): Payer: Medicare Other | Admitting: Family Medicine

## 2016-08-21 VITALS — BP 110/62 | HR 90 | Temp 98.1°F | Ht 61.5 in | Wt 160.5 lb

## 2016-08-21 DIAGNOSIS — I10 Essential (primary) hypertension: Secondary | ICD-10-CM

## 2016-08-21 NOTE — Progress Notes (Signed)
Pre visit review using our clinic review tool, if applicable. No additional management support is needed unless otherwise documented below in the visit note. 

## 2016-08-21 NOTE — Progress Notes (Signed)
Subjective:     Patient ID: [name], female   DOB: [birth year], 75 y.o.   MRN: [id number]  HPI Patient seen with concerns for elevated blood pressure. Takes amlodipine for hypertension. She was recently out in [place] visiting family and had consistent blood pressure readings 683M systolic and around 19-62 diastolic. Since she's been home she's had blood pressures consistently around 229 systolic and 79G diastolic-over the past week. No alcohol use. No recent headache. No nonsteroidal use. Watches sodium intake closely.  Past Medical History:  Diagnosis Date  . Allergic rhinitis   . Anemia   . Anxiety   . Bronchiectasis   . Carpal tunnel syndrome 07/13/2015   Bilateral  . CVA (cerebral infarction) 10/2007   Right thalamic   . Diverticulosis of colon   . Gait disorder   . GERD (gastroesophageal reflux disease)   . History of breast cancer   . HTN (hypertension)   . Hyperlipidemia   . Idiopathic pulmonary fibrosis   . Left knee DJD   . Migraine   . Osteoporosis   . Peripheral neuropathy ([place])   . PMR (polymyalgia rheumatica) (HCC)   . Polyneuropathy in other diseases classified elsewhere ([place]) 02/17/2013  . Previous back surgery 10/09/12  . Renal insufficiency   . Seizure disorder ([place])   . Spondylosis, cervical, with myelopathy 08/31/2015   C3-4 myelopathy  . Temporal arteritis (HCC)    Right eye blind, on steroids per [facility]/ Dr [name]  . Type II or unspecified type diabetes mellitus without mention of complication, not stated as uncontrolled    2nd to steriods  . Vitamin D deficiency    Past Surgical History:  Procedure Laterality Date  . CARPAL TUNNEL RELEASE Right   . CATARACT EXTRACTION     OS - Summer 2010  . CERVICAL FUSION  2010   4 rods and pins in place  . CHOLECYSTECTOMY    . COLONOSCOPY  12/15/2011   Procedure: COLONOSCOPY;  Surgeon: [name], MD;  [facility];  Service: Endoscopy;  Laterality: N/A;  . LUMBAR FUSION  04/2010   W/Mechanical fixation - [place]  2013   rods in hips (to stabilize).  Marland Kitchen MASTECTOMY    . NECK SURGERY    . rheumatoid nodule removal    . SPINAL FUSION  07/2010   T10-L2 interbody fusion / [facility]  . TONSILLECTOMY    . TOTAL KNEE ARTHROPLASTY     Left    reports that she has never smoked. She has never used smokeless tobacco. She reports that she does not drink alcohol or use drugs. family history includes Brain cancer in her mother; Breast cancer in her sister; Dementia in her father; Hypertension in her father and mother; Multiple sclerosis in her sister. Allergies  Allergen Reactions  . Hydromorphone Other (See Comments)    Cognitive changes       Review of Systems  Constitutional: Negative for fatigue.  Eyes: Negative for visual disturbance.  Respiratory: Negative for cough, chest tightness, shortness of breath and wheezing.   Cardiovascular: Negative for chest pain, palpitations and leg swelling.  Neurological: Negative for dizziness, seizures, syncope, weakness, light-headedness and headaches.       Objective:   Physical Exam  Constitutional: She appears well-developed and well-nourished.  Eyes: Pupils are equal, round, and reactive to light.  Neck: Neck supple. No JVD present. No thyromegaly present.  Cardiovascular: Normal rate and regular rhythm.  Exam reveals no gallop.  Pulmonary/Chest: Effort normal and breath sounds normal. No respiratory distress. She has no wheezes. She has no rales.  Musculoskeletal: She exhibits no edema.  Neurological: She is alert.       Assessment:     Hypertension. Currently well controlled.    Plan:     -continue current medications.  Would be reluctant to add additional medications based on readings we got today -Continue close monitoring him in touch if consistently greater than 140/90  Eulas Post MD [facility]

## 2016-08-21 NOTE — Patient Instructions (Signed)
Monitor blood pressure and be in touch if consistently > 150/90 

## 2016-08-25 ENCOUNTER — Other Ambulatory Visit: Payer: Self-pay | Admitting: Family Medicine

## 2016-08-30 ENCOUNTER — Encounter: Payer: Self-pay | Admitting: Family Medicine

## 2016-08-30 DIAGNOSIS — Z6828 Body mass index (BMI) 28.0-28.9, adult: Secondary | ICD-10-CM | POA: Diagnosis not present

## 2016-08-30 DIAGNOSIS — M810 Age-related osteoporosis without current pathological fracture: Secondary | ICD-10-CM | POA: Diagnosis not present

## 2016-08-30 DIAGNOSIS — M5136 Other intervertebral disc degeneration, lumbar region: Secondary | ICD-10-CM | POA: Diagnosis not present

## 2016-08-30 DIAGNOSIS — N184 Chronic kidney disease, stage 4 (severe): Secondary | ICD-10-CM | POA: Diagnosis not present

## 2016-08-30 DIAGNOSIS — M0589 Other rheumatoid arthritis with rheumatoid factor of multiple sites: Secondary | ICD-10-CM | POA: Diagnosis not present

## 2016-08-30 DIAGNOSIS — M316 Other giant cell arteritis: Secondary | ICD-10-CM | POA: Diagnosis not present

## 2016-08-30 DIAGNOSIS — M7989 Other specified soft tissue disorders: Secondary | ICD-10-CM | POA: Diagnosis not present

## 2016-08-30 DIAGNOSIS — E663 Overweight: Secondary | ICD-10-CM | POA: Diagnosis not present

## 2016-08-30 DIAGNOSIS — M150 Primary generalized (osteo)arthritis: Secondary | ICD-10-CM | POA: Diagnosis not present

## 2016-08-31 DIAGNOSIS — M0589 Other rheumatoid arthritis with rheumatoid factor of multiple sites: Secondary | ICD-10-CM | POA: Diagnosis not present

## 2016-09-03 ENCOUNTER — Encounter (HOSPITAL_COMMUNITY): Payer: Self-pay | Admitting: Emergency Medicine

## 2016-09-03 ENCOUNTER — Emergency Department (HOSPITAL_COMMUNITY)
Admission: EM | Admit: 2016-09-03 | Discharge: 2016-09-03 | Disposition: A | Discharge status: Home / Self Care | Payer: Medicare Other | Attending: Dermatology | Admitting: Dermatology

## 2016-09-03 DIAGNOSIS — E119 Type 2 diabetes mellitus without complications: Secondary | ICD-10-CM | POA: Insufficient documentation

## 2016-09-03 DIAGNOSIS — I10 Essential (primary) hypertension: Secondary | ICD-10-CM | POA: Insufficient documentation

## 2016-09-03 DIAGNOSIS — J3089 Other allergic rhinitis: Secondary | ICD-10-CM | POA: Diagnosis not present

## 2016-09-03 DIAGNOSIS — Z8673 Personal history of transient ischemic attack (TIA), and cerebral infarction without residual deficits: Secondary | ICD-10-CM | POA: Diagnosis not present

## 2016-09-03 DIAGNOSIS — R51 Headache: Secondary | ICD-10-CM | POA: Diagnosis not present

## 2016-09-03 NOTE — ED Triage Notes (Signed)
Patient reports fluctuating blood pressure x2 months. Patient c/o dull headache and elevated BP at home today. Denies chest pain, SOB, dizziness, and N/V/D.

## 2016-09-04 ENCOUNTER — Telehealth: Payer: Self-pay | Admitting: Family Medicine

## 2016-09-04 ENCOUNTER — Ambulatory Visit (INDEPENDENT_AMBULATORY_CARE_PROVIDER_SITE_OTHER): Payer: Medicare Other | Admitting: Neurology

## 2016-09-04 ENCOUNTER — Encounter: Payer: Self-pay | Admitting: Neurology

## 2016-09-04 VITALS — BP 110/71 | HR 70 | Ht 62.0 in | Wt 161.0 lb

## 2016-09-04 DIAGNOSIS — M316 Other giant cell arteritis: Secondary | ICD-10-CM | POA: Diagnosis not present

## 2016-09-04 MED ORDER — DICLOFENAC SODIUM 1 % TD GEL: 2.0000 g | Freq: Four times a day (QID) | TRANSDERMAL | 3 refills | Status: DC

## 2016-09-04 MED ORDER — GABAPENTIN 100 MG PO CAPS: 200.0000 mg | ORAL_CAPSULE | Freq: Every day | ORAL | 3 refills | Status: DC

## 2016-09-04 MED ORDER — PREDNISONE 1 MG PO TABS: 4.0000 mg | ORAL_TABLET | Freq: Every day | ORAL | 2 refills | Status: DC

## 2016-09-04 NOTE — Progress Notes (Signed)
Reason for visit: Temporal arteritis  [name] is an 75 y.o. female  History of present illness:  [name] is a 75 year old right-handed white female with a history of temporal arteritis and rheumatoid arthritis. The patient also has had lumbosacral spine surgery, she has a chronic gait disorder. The patient was in the emergency room yesterday with some issues with labile blood pressure. The patient is having some burning in the feet associated with her peripheral neuropathy, she is taking 600 mg of gabapentin twice daily, higher doses have resulted in significant drowsiness. The patient has not had any falls recently, she uses a walker for ambulation. The patient has noted good improvement with Voltaren gel on the feet at night for the neuropathy pain. She returns to this office for an evaluation.  Past Medical History:  Diagnosis Date  . Allergic rhinitis   . Anemia   . Anxiety   . Bronchiectasis   . Carpal tunnel syndrome 07/13/2015   Bilateral  . CVA (cerebral infarction) 10/2007   Right thalamic   . Diverticulosis of colon   . Gait disorder   . GERD (gastroesophageal reflux disease)   . History of breast cancer   . HTN (hypertension)   . Hyperlipidemia   . Idiopathic pulmonary fibrosis   . Left knee DJD   . Migraine   . Osteoporosis   . Peripheral neuropathy ([place])   . PMR (polymyalgia rheumatica) (HCC)   . Polyneuropathy in other diseases classified elsewhere ([place]) 02/17/2013  . Previous back surgery 10/09/12  . Renal insufficiency   . Seizure disorder ([place])   . Spondylosis, cervical, with myelopathy 08/31/2015   C3-4 myelopathy  . Temporal arteritis (HCC)    Right eye blind, on steroids per [facility]/ Dr [name]  . Type II or unspecified type diabetes mellitus without mention of complication, not stated as uncontrolled    2nd to steriods  . Vitamin D deficiency     Past Surgical History:  Procedure Laterality Date  . CARPAL TUNNEL RELEASE Right   .  CATARACT EXTRACTION     OS - Summer 2010  . CERVICAL FUSION  2010   4 rods and pins in place  . CHOLECYSTECTOMY    . COLONOSCOPY  12/15/2011   Procedure: COLONOSCOPY;  Surgeon: [name], MD;  Location: [facility];  Service: Endoscopy;  Laterality: N/A;  . LUMBAR FUSION  04/2010   W/Mechanical fixation - [place]  2013   rods in hips (to stabilize).  Marland Kitchen MASTECTOMY    . NECK SURGERY    . rheumatoid nodule removal    . SPINAL FUSION  07/2010   T10-L2 interbody fusion / [facility]  . TONSILLECTOMY    . TOTAL KNEE ARTHROPLASTY     Left    Family History  Problem Relation Age of Onset  . Brain cancer Mother   . Hypertension Mother   . Dementia Father   . Hypertension Father   . Breast cancer Sister   . Multiple sclerosis Sister     Social history:  reports that she has never smoked. She has never used smokeless tobacco. She reports that she does not drink alcohol or use drugs.    Allergies  Allergen Reactions  . Hydromorphone Other (See Comments)    Cognitive changes     Medications:  Prior to Admission medications   Medication Sig Start Date End Date Taking? Authorizing Provider  amLODipine (NORVASC) 5 MG tablet TAKE  1 TABLET DAILY 06/11/16  Yes Eulas Post, MD  Calcium Carb-Cholecalciferol (CALCIUM CARBONATE-VITAMIN D3 PO) Take 1 tablet by mouth daily   Yes Historical Provider, MD  cholecalciferol (VITAMIN D) 1000 UNITS tablet Take 1,000 Units by mouth daily.     Yes Historical Provider, MD  denosumab (PROLIA) 60 MG/ML SOLN Inject 60 mg into the skin every 6 (six) months.     Yes Historical Provider, MD  diclofenac sodium (VOLTAREN) 1 % GEL Apply 2 g topically 4 (four) times daily. 05/31/16  Yes Kathrynn Ducking, MD  ferrous sulfate 325 (65 FE) MG tablet Take 325 mg by mouth 2 (two) times daily.    Yes Historical Provider, MD  furosemide (LASIX) 40 MG tablet TAKE 1 TABLET DAILY 08/27/16  Yes Eulas Post, MD  gabapentin  (NEURONTIN) 300 MG capsule Take 2 capsules (600 mg total) by mouth 2 (two) times daily. 04/02/16  Yes Kathrynn Ducking, MD  Golimumab [facility] ARIA IV) Inject into the vein. Takes infusion every 8 wks.   Yes Historical Provider, MD  KLOR-CON M10 10 MEQ tablet TAKE 1 TABLET TWICE A DAY 08/27/16  Yes Eulas Post, MD  Multiple Vitamins-Minerals (MULTIVITAMIN,TX-MINERALS) tablet Take 1 tablet by mouth daily.     Yes Historical Provider, MD  oxyCODONE (OXYCONTIN) 10 mg 12 hr tablet Take 1 tablet (10 mg total) by mouth every 12 (twelve) hours. 07/31/16  Yes Eulas Post, MD  oxyCODONE (OXYCONTIN) 10 mg 12 hr tablet Take 1 tablet (10 mg total) by mouth every 12 (twelve) hours. Refill in 1 month. 07/31/16  Yes Eulas Post, MD  Oxycodone HCl 10 MG TABS Take 10 mg by mouth every 6 (six) hours as needed.   Yes Historical Provider, MD  predniSONE (DELTASONE) 5 MG tablet TAKE 1 TABLET DAILY WITH BREAKFAST (ALONG WITH 1 MG TABLETS) 08/21/16  Yes Kathrynn Ducking, MD  simvastatin (ZOCOR) 5 MG tablet TAKE 1 TABLET AT BEDTIME 07/17/16  Yes Eulas Post, MD    ROS:  Out of a complete 14 system review of symptoms, the patient complains only of the following symptoms, and all other reviewed systems are negative.  Ringing in the ears Frequency of urination Back pain, walking difficulty, neck pain Numbness  Blood pressure 110/71, pulse 70, height 5\' 2"  (1.575 m), weight 161 lb (73 kg).  Physical Exam  General: The patient is alert and cooperative at the time of the examination.  Skin: No significant peripheral edema is noted.   Neurologic Exam  Mental status: The patient is alert and oriented x 3 at the time of the examination. The patient has apparent normal recent and remote memory, with an apparently normal attention span and concentration ability.   Cranial nerves: Facial symmetry is present. Speech is normal, no aphasia or dysarthria is noted. Extraocular movements are full. Visual  fields are full.  Motor: The patient has good strength in all 4 extremities.  Sensory examination: Soft touch sensation is symmetric on the face, arms, and legs.  Coordination: The patient has good finger-nose-finger and heel-to-shin bilaterally.  Gait and station: The patient has a slightly wide-based gait, the patient usually uses a walker for ambulation. Tandem gait was not attempted. Romberg is negative. No drift is seen.  Reflexes: Deep tendon reflexes are symmetric.   Assessment/Plan:  1. Peripheral neuropathy  2. Temporal arteritis  3. Rheumatoid arthritis  4. Gait disorder  The patient will be decreased on the prednisone taking 4 mg daily for  2 months, then go to 3 mg daily. A sedimentation rate will be checked today. The patient will be given a prescription for the 100 mg gabapentin tablets to take 1 or 2 at night to see if this can improve the peripheral neuropathy symptoms without causing oversedation during the day. The patient was given a prescription for the Voltaren gel. She will follow-up in 6 months, sooner if needed.  Jill Alexanders MD 09/04/2016 10:39 AM  [facility] [address] [place], [place] [address]  Phone [phone] Fax [phone]

## 2016-09-04 NOTE — Telephone Encounter (Signed)
[name] -- Please contact patient about medication, dosage, frequently, etc. She received from urgent care. Also, determine what questions she has for Dr. Elease Hashimoto

## 2016-09-04 NOTE — Telephone Encounter (Signed)
Patient Name: [name]  DOB: 1941-01-07    Initial Comment Caller states, she hand rx. question. BP was elevated and is stable now. Was seen at [facility] and they gave her rx. at a low dose for bp. 105/65. Verified    Nurse Assessment      Guidelines    Guideline Title Affirmed Question Affirmed Notes       Final Disposition User   FINAL ATTEMPT MADE - no message left Stand[place], RN, [employer]

## 2016-09-04 NOTE — Patient Instructions (Addendum)
   Clonidine was given for blood pressure.   With the prednisone 1 mg tablet, begin 4 tablets daily for 2 months and then take 3 tablets daily.  With the gabapentin 100 mg try taking one or 2 at night with the 300 mg capsules.

## 2016-09-04 NOTE — Telephone Encounter (Signed)
LMTCB

## 2016-09-05 ENCOUNTER — Ambulatory Visit (INDEPENDENT_AMBULATORY_CARE_PROVIDER_SITE_OTHER): Payer: Medicare Other | Admitting: Family Medicine

## 2016-09-05 ENCOUNTER — Telehealth: Payer: Self-pay

## 2016-09-05 VITALS — BP 132/78 | HR 92 | Temp 97.6°F | Ht 62.0 in | Wt 160.8 lb

## 2016-09-05 DIAGNOSIS — I10 Essential (primary) hypertension: Secondary | ICD-10-CM | POA: Diagnosis not present

## 2016-09-05 LAB — SEDIMENTATION RATE: Sed Rate: 17 mm/hr (ref 0–40)

## 2016-09-05 NOTE — Patient Instructions (Addendum)
Follow up in one week and bring in your cuff to compare with ours Bring in your BP readings.   Watch your salt intake- goal < 3 grams per day. DASH Eating Plan DASH stands for "Dietary Approaches to Stop Hypertension." The DASH eating plan is a healthy eating plan that has been shown to reduce high blood pressure (hypertension). Additional health benefits may include reducing the risk of type 2 diabetes mellitus, heart disease, and stroke. The DASH eating plan may also help with weight loss. What do I need to know about the DASH eating plan? For the DASH eating plan, you will follow these general guidelines:  Choose foods with less than 150 milligrams of sodium per serving (as listed on the food label).  Use salt-free seasonings or herbs instead of table salt or sea salt.  Check with your health care provider or pharmacist before using salt substitutes.  Eat lower-sodium products. These are often labeled as "low-sodium" or "no salt added."  Eat fresh foods. Avoid eating a lot of canned foods.  Eat more vegetables, fruits, and low-fat dairy products.  Choose whole grains. Look for the word "whole" as the first word in the ingredient list.  Choose fish and skinless chicken or [place] more often than red meat. Limit fish, poultry, and meat to 6 oz (170 g) each day.  Limit sweets, desserts, sugars, and sugary drinks.  Choose heart-healthy fats.  Eat more home-cooked food and less restaurant, buffet, and fast food.  Limit fried foods.  Do not fry foods. Cook foods using methods such as baking, boiling, grilling, and broiling instead.  When eating at a restaurant, ask that your food be prepared with less salt, or no salt if possible. What foods can I eat? Seek help from a dietitian for individual calorie needs. Grains  Whole grain or whole wheat bread. Brown rice. Whole grain or whole wheat pasta. Quinoa, bulgur, and whole grain cereals. Low-sodium cereals. Corn or whole wheat flour  tortillas. Whole grain cornbread. Whole grain crackers. Low-sodium crackers. Vegetables  Fresh or frozen vegetables (raw, steamed, roasted, or grilled). Low-sodium or reduced-sodium tomato and vegetable juices. Low-sodium or reduced-sodium tomato sauce and paste. Low-sodium or reduced-sodium canned vegetables. Fruits  All fresh, canned (in natural juice), or frozen fruits. Meat and Other Protein Products  Ground beef (85% or leaner), grass-fed beef, or beef trimmed of fat. Skinless chicken or [place]. Ground chicken or [place]. Pork trimmed of fat. All fish and seafood. Eggs. Dried beans, peas, or lentils. Unsalted nuts and seeds. Unsalted canned beans. Dairy  Low-fat dairy products, such as skim or 1% milk, 2% or reduced-fat cheeses, low-fat ricotta or cottage cheese, or plain low-fat yogurt. Low-sodium or reduced-sodium cheeses. Fats and Oils  Tub margarines without trans fats. Light or reduced-fat mayonnaise and salad dressings (reduced sodium). Avocado. Safflower, olive, or canola oils. Natural peanut or almond butter. Other  Unsalted popcorn and pretzels. The items listed above may not be a complete list of recommended foods or beverages. Contact your dietitian for more options.  What foods are not recommended? Grains  White bread. White pasta. White rice. Refined cornbread. Bagels and croissants. Crackers that contain trans fat. Vegetables  Creamed or fried vegetables. Vegetables in a cheese sauce. Regular canned vegetables. Regular canned tomato sauce and paste. Regular tomato and vegetable juices. Fruits  Canned fruit in light or heavy syrup. Fruit juice. Meat and Other Protein Products  Fatty cuts of meat. Ribs, chicken wings, bacon, sausage, bologna, salami, chitterlings, fatback, hot  dogs, bratwurst, and packaged luncheon meats. Salted nuts and seeds. Canned beans with salt. Dairy  Whole or 2% milk, cream, half-and-half, and cream cheese. Whole-fat or sweetened yogurt. Full-fat  cheeses or blue cheese. Nondairy creamers and whipped toppings. Processed cheese, cheese spreads, or cheese curds. Condiments  Onion and garlic salt, seasoned salt, table salt, and sea salt. Canned and packaged gravies. Worcestershire sauce. Tartar sauce. Barbecue sauce. Teriyaki sauce. Soy sauce, including reduced sodium. Steak sauce. Fish sauce. Oyster sauce. Cocktail sauce. Horseradish. Ketchup and mustard. Meat flavorings and tenderizers. Bouillon cubes. Hot sauce. Tabasco sauce. Marinades. Taco seasonings. Relishes. Fats and Oils  Butter, stick margarine, lard, shortening, ghee, and bacon fat. Coconut, palm kernel, or palm oils. Regular salad dressings. Other  Pickles and olives. Salted popcorn and pretzels. The items listed above may not be a complete list of foods and beverages to avoid. Contact your dietitian for more information.  Where can I find more information? National Heart, Lung, and Blood Institute: travelstabloid.com This information is not intended to replace advice given to you by your health care provider. Make sure you discuss any questions you have with your health care provider. Document Released: 08/30/2011 Document Revised: 02/16/2016 Document Reviewed: 07/15/2013 Elsevier Interactive Patient Education  2017 [employer].

## 2016-09-05 NOTE — Telephone Encounter (Signed)
Called pt w/ unremarkable lab results. Verbalized understanding and appreciation for call. Agreed to take lower dose of prednisone as discussed at yesterday's OV.

## 2016-09-05 NOTE — Progress Notes (Signed)
Subjective:     Patient ID: [name], female   DOB: 18-Feb-1941, 75 y.o.   MRN: [id number]  HPI Patient seen following recent visit to urgent care. She initially went to ER after she took her blood pressure at home and obtained reading 168/95. Several minutes later she checked again and had a reading of 205/105. She went to emergency room at [facility] but there was a long wait and she then went to urgent care. She states her blood pressure there was 162/90. She apparently was given clonidine 0.1 mg and blood pressure came down to 145/85. Since then, her home blood pressures have been around 563O systolic and 80 diastolic. She takes amlodipine 5 mgs daily. No alcohol use. No nonsteroidal use. Other than some mild recent headache no other pain. She was at neurologist yesterday had blood pressure reading 110/71. No dizziness. No chest pains. No peripheral edema issues. Not counting her sodium milligrams  Past Medical History:  Diagnosis Date  . Allergic rhinitis   . Anemia   . Anxiety   . Bronchiectasis   . Carpal tunnel syndrome 07/13/2015   Bilateral  . CVA (cerebral infarction) 10/2007   Right thalamic   . Diverticulosis of colon   . Gait disorder   . GERD (gastroesophageal reflux disease)   . History of breast cancer   . HTN (hypertension)   . Hyperlipidemia   . Idiopathic pulmonary fibrosis   . Left knee DJD   . Migraine   . Osteoporosis   . Peripheral neuropathy ([place])   . PMR (polymyalgia rheumatica) (HCC)   . Polyneuropathy in other diseases classified elsewhere ([place]) 02/17/2013  . Previous back surgery 10/09/12  . Renal insufficiency   . Seizure disorder ([place])   . Spondylosis, cervical, with myelopathy 08/31/2015   C3-4 myelopathy  . Temporal arteritis (HCC)    Right eye blind, on steroids per [facility]/ Dr [name]  . Type II or unspecified type diabetes mellitus without mention of complication, not stated as uncontrolled    2nd to steriods  . Vitamin D deficiency     Past Surgical History:  Procedure Laterality Date  . CARPAL TUNNEL RELEASE Right   . CATARACT EXTRACTION     OS - Summer 2010  . CERVICAL FUSION  2010   4 rods and pins in place  . CHOLECYSTECTOMY    . COLONOSCOPY  12/15/2011   Procedure: COLONOSCOPY;  Surgeon: [name], MD;  [facility];  Service: Endoscopy;  Laterality: N/A;  . LUMBAR FUSION  04/2010   W/Mechanical fixation - [place]  2013   rods in hips (to stabilize).  Marland Kitchen MASTECTOMY    . NECK SURGERY    . rheumatoid nodule removal    . SPINAL FUSION  07/2010   T10-L2 interbody fusion / [facility]  . TONSILLECTOMY    . TOTAL KNEE ARTHROPLASTY     Left    reports that she has never smoked. She has never used smokeless tobacco. She reports that she does not drink alcohol or use drugs. family history includes Brain cancer in her mother; Breast cancer in her sister; Dementia in her father; Hypertension in her father and mother; Multiple sclerosis in her sister. Allergies  Allergen Reactions  . Hydromorphone Other (See Comments)    Cognitive changes      Review of Systems  Constitutional: Negative for fatigue and unexpected weight change.  Eyes: Negative for visual disturbance.  Respiratory: Negative for cough,  chest tightness, shortness of breath and wheezing.   Cardiovascular: Negative for chest pain, palpitations and leg swelling.  Genitourinary: Negative for dysuria.  Musculoskeletal: Positive for arthralgias and back pain.  Neurological: Negative for dizziness, seizures, syncope, weakness, light-headedness and headaches.       Objective:   Physical Exam  Constitutional: She appears well-developed and well-nourished.  Eyes: Pupils are equal, round, and reactive to light.  Neck: Neck supple. No JVD present. No thyromegaly present.  Cardiovascular: Normal rate and regular rhythm.  Exam reveals no gallop.   Pulmonary/Chest: Effort normal and breath sounds normal. No  respiratory distress. She has no wheezes. She has no rales.  Musculoskeletal: She exhibits no edema.  Neurological: She is alert.       Assessment:     Hypertension history with recent transient elevations above. Blood pressure today seated 138/80 and we obtained the same exact reading standing    Plan:     -Would not make any blood pressure medication changes at this time -Monitor sodium intake and try to keep below 3 g per day -Information on DASH diet given -Monitor home blood pressures and bring in her cuff to compare with ours along with readings to review in one week  Eulas Post MD [facility] at [facility]

## 2016-09-05 NOTE — Telephone Encounter (Signed)
-----   Message from Kathrynn Ducking, MD sent at 09/05/2016  7:23 AM EST -----  The blood work results are unremarkable. Please call the patient.  ----- Message ----- From: Lavone Neri Lab Results In Sent: 09/05/2016   5:40 AM To: Kathrynn Ducking, MD

## 2016-09-05 NOTE — Progress Notes (Signed)
Pre visit review using our clinic review tool, if applicable. No additional management support is needed unless otherwise documented below in the visit note. 

## 2016-09-06 NOTE — Telephone Encounter (Signed)
Pt was seen by Dr Elease Hashimoto yesterday and all questions answered. Nothing further needed at this time.

## 2016-09-13 ENCOUNTER — Ambulatory Visit: Payer: Medicare Other

## 2016-09-13 VITALS — BP 129/68

## 2016-09-13 DIAGNOSIS — I10 Essential (primary) hypertension: Secondary | ICD-10-CM

## 2016-10-30 ENCOUNTER — Ambulatory Visit (INDEPENDENT_AMBULATORY_CARE_PROVIDER_SITE_OTHER): Payer: Medicare Other | Admitting: Family Medicine

## 2016-10-30 ENCOUNTER — Encounter: Payer: Self-pay | Admitting: Family Medicine

## 2016-10-30 VITALS — BP 116/62 | HR 70 | Ht 62.0 in | Wt 155.7 lb

## 2016-10-30 DIAGNOSIS — I10 Essential (primary) hypertension: Secondary | ICD-10-CM

## 2016-10-30 DIAGNOSIS — J3489 Other specified disorders of nose and nasal sinuses: Secondary | ICD-10-CM | POA: Diagnosis not present

## 2016-10-30 DIAGNOSIS — M810 Age-related osteoporosis without current pathological fracture: Secondary | ICD-10-CM | POA: Diagnosis not present

## 2016-10-30 DIAGNOSIS — M545 Low back pain: Secondary | ICD-10-CM | POA: Diagnosis not present

## 2016-10-30 DIAGNOSIS — G8929 Other chronic pain: Secondary | ICD-10-CM

## 2016-10-30 MED ORDER — AMLODIPINE BESYLATE 5 MG PO TABS
5.0000 mg | ORAL_TABLET | Freq: Every day | ORAL | 3 refills | Status: DC
Start: 2016-10-30 — End: 2017-10-09

## 2016-10-30 MED ORDER — OXYCODONE HCL ER 10 MG PO T12A: 10.0000 mg | EXTENDED_RELEASE_TABLET | Freq: Two times a day (BID) | ORAL | 0 refills | Status: DC

## 2016-10-30 MED ORDER — MUPIROCIN 2 % EX OINT: 1.0000 "application " | TOPICAL_OINTMENT | Freq: Two times a day (BID) | CUTANEOUS | 0 refills | Status: DC

## 2016-10-30 MED ORDER — OXYCODONE HCL ER 10 MG PO T12A: EXTENDED_RELEASE_TABLET | ORAL | 0 refills | Status: DC

## 2016-10-30 NOTE — Progress Notes (Signed)
Subjective:     Patient ID: [name], female   DOB: 1941-01-03, 76 y.o.   MRN: [id number]  HPI Patient seen for medical follow-up. She has hypertension treated with amlodipine. She had transient elevation blood pressure back in December but since that time had very stable readings. She's been getting home readings around 112/65. Headaches. No dizziness. No chest pains.  She has history of temporal arteritis is on very low-dose prednisone and is tapering off with neurology. She has hyperlipidemia treated with low-dose simvastatin. She has had chronic back pain now for years and has been on OxyContin and rarely supplements oxycodone. Her current pain levels 4-5 out of 10 at baseline at this level she is able carry out her day-to-day activities.  She has new problem of couple of sores anterior nares right and left side. She had some rhinorrhea few weeks ago has had persistent soreness. No drainage at this time. No crusting. No known history of MRSA.  Osteoporosis and she gets per Prolia injections every 6 months.  Past Medical History:  Diagnosis Date  . Allergic rhinitis   . Anemia   . Anxiety   . Bronchiectasis   . Carpal tunnel syndrome 07/13/2015   Bilateral  . CVA (cerebral infarction) 10/2007   Right thalamic   . Diverticulosis of colon   . Gait disorder   . GERD (gastroesophageal reflux disease)   . History of breast cancer   . HTN (hypertension)   . Hyperlipidemia   . Idiopathic pulmonary fibrosis   . Left knee DJD   . Migraine   . Osteoporosis   . Peripheral neuropathy ([place])   . PMR (polymyalgia rheumatica) (HCC)   . Polyneuropathy in other diseases classified elsewhere ([place]) 02/17/2013  . Previous back surgery 10/09/12  . Renal insufficiency   . Seizure disorder ([place])   . Spondylosis, cervical, with myelopathy 08/31/2015   C3-4 myelopathy  . Temporal arteritis (HCC)    Right eye blind, on steroids per [facility]/ Dr [name]  . Type II or unspecified type diabetes  mellitus without mention of complication, not stated as uncontrolled    2nd to steriods  . Vitamin D deficiency    Past Surgical History:  Procedure Laterality Date  . CARPAL TUNNEL RELEASE Right   . CATARACT EXTRACTION     OS - Summer 2010  . CERVICAL FUSION  2010   4 rods and pins in place  . CHOLECYSTECTOMY    . COLONOSCOPY  12/15/2011   Procedure: COLONOSCOPY;  Surgeon: [name], MD;  [facility];  Service: Endoscopy;  Laterality: N/A;  . LUMBAR FUSION  04/2010   W/Mechanical fixation - [place]  2013   rods in hips (to stabilize).  Marland Kitchen MASTECTOMY    . NECK SURGERY    . rheumatoid nodule removal    . SPINAL FUSION  07/2010   T10-L2 interbody fusion / [facility]  . TONSILLECTOMY    . TOTAL KNEE ARTHROPLASTY     Left    reports that she has never smoked. She has never used smokeless tobacco. She reports that she does not drink alcohol or use drugs. family history includes Brain cancer in her mother; Breast cancer in her sister; Dementia in her father; Hypertension in her father and mother; Multiple sclerosis in her sister. Allergies  Allergen Reactions  . Hydromorphone Other (See Comments)    Cognitive changes      Review of Systems  Constitutional: Negative for fatigue.  Eyes: Negative for visual disturbance.  Respiratory: Negative for cough, chest tightness, shortness of breath and wheezing.   Cardiovascular: Negative for chest pain, palpitations and leg swelling.  Musculoskeletal: Positive for back pain and neck pain.  Neurological: Negative for dizziness, seizures, syncope, weakness, light-headedness and headaches.       Objective:   Physical Exam  Constitutional: She appears well-developed and well-nourished.  Eyes: Pupils are equal, round, and reactive to light.  Neck: Neck supple. No JVD present. No thyromegaly present.  Cardiovascular: Normal rate and regular rhythm.  Exam reveals no gallop.   Pulmonary/Chest:  Effort normal. No respiratory distress. She has no wheezes.  She has chronic bibasilar crackles related to her pulmonary fibrosis  Musculoskeletal: She exhibits no edema.  Neurological: She is alert.       Assessment:     #1 chronic back pain stable on current regimen of OxyContin 10 mg twice daily  #2 hypertension stable and at goal  #3 osteoporosis on Prolia injections every 6 months  #4 small sores left and right nares-suspect related to recent rhinorrhea and drying    Plan:     -Trial of Bactroban ointment apply twice a day to left and right anterior nares -Refill OxyContin for 3 months -Continue amlodipine for hypertension -Recheck lipids at follow-up in 3 months  [name] Post MD [facility] at [facility]

## 2016-10-30 NOTE — Patient Instructions (Signed)
Use the Bactroban ointment twice daily inside nose

## 2016-10-31 DIAGNOSIS — M0589 Other rheumatoid arthritis with rheumatoid factor of multiple sites: Secondary | ICD-10-CM | POA: Diagnosis not present

## 2016-11-18 ENCOUNTER — Other Ambulatory Visit: Payer: Self-pay | Admitting: Neurology

## 2016-11-28 DIAGNOSIS — N184 Chronic kidney disease, stage 4 (severe): Secondary | ICD-10-CM | POA: Diagnosis not present

## 2016-11-28 DIAGNOSIS — M810 Age-related osteoporosis without current pathological fracture: Secondary | ICD-10-CM | POA: Diagnosis not present

## 2016-11-28 DIAGNOSIS — M150 Primary generalized (osteo)arthritis: Secondary | ICD-10-CM | POA: Diagnosis not present

## 2016-11-28 DIAGNOSIS — Z6827 Body mass index (BMI) 27.0-27.9, adult: Secondary | ICD-10-CM | POA: Diagnosis not present

## 2016-11-28 DIAGNOSIS — E663 Overweight: Secondary | ICD-10-CM | POA: Diagnosis not present

## 2016-11-28 DIAGNOSIS — M5136 Other intervertebral disc degeneration, lumbar region: Secondary | ICD-10-CM | POA: Diagnosis not present

## 2016-11-28 DIAGNOSIS — M316 Other giant cell arteritis: Secondary | ICD-10-CM | POA: Diagnosis not present

## 2016-11-28 DIAGNOSIS — M0589 Other rheumatoid arthritis with rheumatoid factor of multiple sites: Secondary | ICD-10-CM | POA: Diagnosis not present

## 2016-11-28 LAB — CBC AND DIFFERENTIAL
HEMATOCRIT: 42 (ref 36–46)
Hemoglobin: 13.8 (ref 12.0–16.0)

## 2016-11-30 ENCOUNTER — Telehealth: Payer: Self-pay | Admitting: Neurology

## 2016-11-30 NOTE — Telephone Encounter (Signed)
I have received blood work results from Dr. Amil Amen that were done on 11/28/2016. CBC and differential were completely normal with white count of 8.2, hemoglobin 13.8, hematocrit of 41.8, platelets of 238. Sedimentation rate was 6. Chemistry panel shows a creatinine of 1.33, BUN of 20, GFR of 39, sodium 142, potassium 3.7, chloride 96, CO2 29, calcium 9.9, total protein 6.6, albumin 4.0, liver profile is normal.

## 2016-12-07 DIAGNOSIS — M810 Age-related osteoporosis without current pathological fracture: Secondary | ICD-10-CM | POA: Diagnosis not present

## 2016-12-28 DIAGNOSIS — M0589 Other rheumatoid arthritis with rheumatoid factor of multiple sites: Secondary | ICD-10-CM | POA: Diagnosis not present

## 2016-12-31 ENCOUNTER — Other Ambulatory Visit: Payer: Self-pay | Admitting: *Deleted

## 2016-12-31 ENCOUNTER — Encounter: Payer: Self-pay | Admitting: Family Medicine

## 2016-12-31 ENCOUNTER — Ambulatory Visit (INDEPENDENT_AMBULATORY_CARE_PROVIDER_SITE_OTHER): Payer: Medicare Other | Admitting: Family Medicine

## 2016-12-31 VITALS — BP 120/80 | HR 72 | Temp 98.4°F | Wt 154.0 lb

## 2016-12-31 DIAGNOSIS — G8929 Other chronic pain: Secondary | ICD-10-CM | POA: Diagnosis not present

## 2016-12-31 DIAGNOSIS — M542 Cervicalgia: Secondary | ICD-10-CM | POA: Diagnosis not present

## 2016-12-31 DIAGNOSIS — M545 Low back pain: Secondary | ICD-10-CM | POA: Diagnosis not present

## 2016-12-31 DIAGNOSIS — E785 Hyperlipidemia, unspecified: Secondary | ICD-10-CM | POA: Diagnosis not present

## 2016-12-31 DIAGNOSIS — I10 Essential (primary) hypertension: Secondary | ICD-10-CM | POA: Diagnosis not present

## 2016-12-31 MED ORDER — OXYCODONE HCL ER 10 MG PO T12A: 10.0000 mg | EXTENDED_RELEASE_TABLET | Freq: Two times a day (BID) | ORAL | 0 refills | Status: DC

## 2016-12-31 NOTE — Progress Notes (Signed)
Subjective:     Patient ID: [name], female   DOB: 03-11-1941, 76 y.o.   MRN: [id number]  HPI Patient seen for medical follow-up. She has history of chronic cervicogenic back pain, hypertension, hyperlipidemia, obesity, temporal arteritis. She is followed by neurology for temporal arteritis and is on very low-dose prednisone and tapering.  Her chronic neck pain is fairly well controlled with OxyContin 10 mg twice a day. She denies any major constipation or other side effects. She is able to get around much better functionally with this. She also takes gabapentin. Hypertension controlled with amlodipine. She is on low-dose simvastatin for hyperlipidemia.  Past Medical History:  Diagnosis Date  . Allergic rhinitis   . Anemia   . Anxiety   . Bronchiectasis   . Carpal tunnel syndrome 07/13/2015   Bilateral  . CVA (cerebral infarction) 10/2007   Right thalamic   . Diverticulosis of colon   . Gait disorder   . GERD (gastroesophageal reflux disease)   . History of breast cancer   . HTN (hypertension)   . Hyperlipidemia   . Idiopathic pulmonary fibrosis   . Left knee DJD   . Migraine   . Osteoporosis   . Peripheral neuropathy ([place])   . PMR (polymyalgia rheumatica) (HCC)   . Polyneuropathy in other diseases classified elsewhere ([place]) 02/17/2013  . Previous back surgery 10/09/12  . Renal insufficiency   . Seizure disorder ([place])   . Spondylosis, cervical, with myelopathy 08/31/2015   C3-4 myelopathy  . Temporal arteritis (HCC)    Right eye blind, on steroids per [facility]/ Dr [name]  . Type II or unspecified type diabetes mellitus without mention of complication, not stated as uncontrolled    2nd to steriods  . Vitamin D deficiency    Past Surgical History:  Procedure Laterality Date  . CARPAL TUNNEL RELEASE Right   . CATARACT EXTRACTION     OS - Summer 2010  . CERVICAL FUSION  2010   4 rods and pins in place  . CHOLECYSTECTOMY    . COLONOSCOPY  12/15/2011   Procedure:  COLONOSCOPY;  Surgeon: [name], MD;  Location: [facility];  Service: Endoscopy;  Laterality: N/A;  . LUMBAR FUSION  04/2010   W/Mechanical fixation - [place]  2013   rods in hips (to stabilize).  Marland Kitchen MASTECTOMY    . NECK SURGERY    . rheumatoid nodule removal    . SPINAL FUSION  07/2010   T10-L2 interbody fusion / [facility]  . TONSILLECTOMY    . TOTAL KNEE ARTHROPLASTY     Left    reports that she has never smoked. She has never used smokeless tobacco. She reports that she does not drink alcohol or use drugs. family history includes Brain cancer in her mother; Breast cancer in her sister; Dementia in her father; Hypertension in her father and mother; Multiple sclerosis in her sister. Allergies  Allergen Reactions  . Hydromorphone Other (See Comments)    Cognitive changes      Review of Systems  Constitutional: Negative for fatigue and unexpected weight change.  Eyes: Negative for visual disturbance.  Respiratory: Negative for cough, chest tightness, shortness of breath and wheezing.   Cardiovascular: Negative for chest pain, palpitations and leg swelling.  Endocrine: Negative for polydipsia and polyuria.  Musculoskeletal: Positive for back pain and neck pain.  Neurological: Negative for dizziness, seizures, syncope, weakness, light-headedness and headaches.       Objective:  Physical Exam  Constitutional: She appears well-developed and well-nourished.  Eyes: Pupils are equal, round, and reactive to light.  Neck: Neck supple. No JVD present. No thyromegaly present.  Cardiovascular: Normal rate and regular rhythm.  Exam reveals no gallop.   Pulmonary/Chest: Effort normal and breath sounds normal. No respiratory distress. She has no wheezes. She has no rales.  Musculoskeletal: She exhibits no edema.  Neurological: She is alert.       Assessment:     #1 chronic cervicogenic neck pain  #2 hypertension stable and at goal  #3  dyslipidemia    Plan:     -Ordered future labs with lipid, hepatic panel, basic metabolic panel and she will return fasting for that. -renew her OxyContin for 3 months -Continue current medications -Routine follow-up in 3 months and sooner as needed  Eulas Post MD [facility]

## 2016-12-31 NOTE — Progress Notes (Signed)
Pre visit review using our clinic review tool, if applicable. No additional management support is needed unless otherwise documented below in the visit note. 

## 2017-01-01 ENCOUNTER — Other Ambulatory Visit (INDEPENDENT_AMBULATORY_CARE_PROVIDER_SITE_OTHER): Payer: Medicare Other

## 2017-01-01 DIAGNOSIS — E785 Hyperlipidemia, unspecified: Secondary | ICD-10-CM

## 2017-01-01 DIAGNOSIS — I10 Essential (primary) hypertension: Secondary | ICD-10-CM | POA: Diagnosis not present

## 2017-01-01 LAB — LIPID PANEL
Cholesterol: 133 mg/dL (ref 0–200)
HDL: 47.4 mg/dL (ref >39.00)
LDL CALC: 70 mg/dL (ref 0–99)
NONHDL: 85.24
Total CHOL/HDL Ratio: 3
Triglycerides: 76 mg/dL (ref 0.0–149.0)
VLDL: 15.2 mg/dL (ref 0.0–40.0)

## 2017-01-01 LAB — BASIC METABOLIC PANEL
BUN: 24 mg/dL — ABNORMAL HIGH (ref 6–23)
CHLORIDE: 104 meq/L (ref 96–112)
CO2: 31 mEq/L (ref 19–32)
Calcium: 8.7 mg/dL (ref 8.4–10.5)
Creatinine, Ser: 1.12 mg/dL (ref 0.40–1.20)
GFR: 50.18 mL/min — AB (ref >60.00)
GLUCOSE: 89 mg/dL (ref 70–99)
POTASSIUM: 4.6 meq/L (ref 3.5–5.1)
SODIUM: 140 meq/L (ref 135–145)

## 2017-01-01 LAB — HEPATIC FUNCTION PANEL
ALBUMIN: 3.8 g/dL (ref 3.5–5.2)
ALT: 15 U/L (ref 0–35)
AST: 21 U/L (ref 0–37)
Alkaline Phosphatase: 82 U/L (ref 39–117)
Bilirubin, Direct: 0.2 mg/dL (ref 0.0–0.3)
Total Bilirubin: 1 mg/dL (ref 0.2–1.2)
Total Protein: 6.5 g/dL (ref 6.0–8.3)

## 2017-01-07 ENCOUNTER — Ambulatory Visit: Payer: Medicare Other | Admitting: Family Medicine

## 2017-01-13 ENCOUNTER — Other Ambulatory Visit: Payer: Self-pay | Admitting: Neurology

## 2017-01-22 ENCOUNTER — Ambulatory Visit: Payer: Medicare Other | Admitting: Family Medicine

## 2017-02-04 ENCOUNTER — Telehealth: Payer: Self-pay | Admitting: Family Medicine

## 2017-02-04 NOTE — Telephone Encounter (Signed)
Called to see if pt wanted to schedule awv - left message. ( told her this could be scheduled anytime - has an appt on 8/3 if she wants to do it then )

## 2017-02-25 DIAGNOSIS — M0589 Other rheumatoid arthritis with rheumatoid factor of multiple sites: Secondary | ICD-10-CM | POA: Diagnosis not present

## 2017-02-26 DIAGNOSIS — Z6827 Body mass index (BMI) 27.0-27.9, adult: Secondary | ICD-10-CM | POA: Diagnosis not present

## 2017-02-26 DIAGNOSIS — J479 Bronchiectasis, uncomplicated: Secondary | ICD-10-CM | POA: Diagnosis not present

## 2017-02-26 DIAGNOSIS — I129 Hypertensive chronic kidney disease with stage 1 through stage 4 chronic kidney disease, or unspecified chronic kidney disease: Secondary | ICD-10-CM | POA: Diagnosis not present

## 2017-02-26 DIAGNOSIS — E669 Obesity, unspecified: Secondary | ICD-10-CM | POA: Diagnosis not present

## 2017-02-26 DIAGNOSIS — M069 Rheumatoid arthritis, unspecified: Secondary | ICD-10-CM | POA: Diagnosis not present

## 2017-02-26 DIAGNOSIS — J8410 Pulmonary fibrosis, unspecified: Secondary | ICD-10-CM | POA: Diagnosis not present

## 2017-02-26 DIAGNOSIS — N183 Chronic kidney disease, stage 3 (moderate): Secondary | ICD-10-CM | POA: Diagnosis not present

## 2017-03-01 DIAGNOSIS — M810 Age-related osteoporosis without current pathological fracture: Secondary | ICD-10-CM | POA: Diagnosis not present

## 2017-03-01 DIAGNOSIS — M0589 Other rheumatoid arthritis with rheumatoid factor of multiple sites: Secondary | ICD-10-CM | POA: Diagnosis not present

## 2017-03-01 DIAGNOSIS — J8410 Pulmonary fibrosis, unspecified: Secondary | ICD-10-CM | POA: Diagnosis not present

## 2017-03-01 DIAGNOSIS — M5136 Other intervertebral disc degeneration, lumbar region: Secondary | ICD-10-CM | POA: Diagnosis not present

## 2017-03-01 DIAGNOSIS — N184 Chronic kidney disease, stage 4 (severe): Secondary | ICD-10-CM | POA: Diagnosis not present

## 2017-03-01 DIAGNOSIS — Z6826 Body mass index (BMI) 26.0-26.9, adult: Secondary | ICD-10-CM | POA: Diagnosis not present

## 2017-03-01 DIAGNOSIS — M150 Primary generalized (osteo)arthritis: Secondary | ICD-10-CM | POA: Diagnosis not present

## 2017-03-01 DIAGNOSIS — M316 Other giant cell arteritis: Secondary | ICD-10-CM | POA: Diagnosis not present

## 2017-03-01 DIAGNOSIS — M79645 Pain in left finger(s): Secondary | ICD-10-CM | POA: Diagnosis not present

## 2017-03-06 ENCOUNTER — Encounter: Payer: Self-pay | Admitting: Family Medicine

## 2017-03-12 ENCOUNTER — Encounter: Payer: Self-pay | Admitting: Neurology

## 2017-03-12 ENCOUNTER — Ambulatory Visit (INDEPENDENT_AMBULATORY_CARE_PROVIDER_SITE_OTHER): Payer: Medicare Other | Admitting: Neurology

## 2017-03-12 VITALS — BP 130/75 | HR 71 | Ht 62.0 in | Wt 146.5 lb

## 2017-03-12 DIAGNOSIS — R200 Anesthesia of skin: Secondary | ICD-10-CM

## 2017-03-12 DIAGNOSIS — M4712 Other spondylosis with myelopathy, cervical region: Secondary | ICD-10-CM | POA: Diagnosis not present

## 2017-03-12 DIAGNOSIS — J8410 Pulmonary fibrosis, unspecified: Secondary | ICD-10-CM

## 2017-03-12 DIAGNOSIS — M316 Other giant cell arteritis: Secondary | ICD-10-CM | POA: Diagnosis not present

## 2017-03-12 DIAGNOSIS — G63 Polyneuropathy in diseases classified elsewhere: Secondary | ICD-10-CM

## 2017-03-12 MED ORDER — LEVETIRACETAM 250 MG PO TABS: 250.0000 mg | ORAL_TABLET | Freq: Every day | ORAL | 3 refills | Status: DC

## 2017-03-12 MED ORDER — GABAPENTIN 300 MG PO CAPS: 600.0000 mg | ORAL_CAPSULE | Freq: Two times a day (BID) | ORAL | 3 refills | Status: DC

## 2017-03-12 MED ORDER — GABAPENTIN 300 MG PO CAPS: 600.0000 mg | ORAL_CAPSULE | Freq: Two times a day (BID) | ORAL | 0 refills | Status: DC

## 2017-03-12 NOTE — Patient Instructions (Signed)
We will get EMG and NCV of the left arm and start Keppra in the evening for the neuropathy pain.

## 2017-03-12 NOTE — Progress Notes (Signed)
Reason for visit: Peripheral neuropathy  [name] is an 76 y.o. female  History of present illness:  [name] is a 76 year old right-handed white female with a history of temporal arteritis and rheumatoid arthritis. The patient has gradually tapered off of the prednisone, she has been off of the medication for about 2 weeks, but she has had increased fatigue off of the medication. The patient has a three-month history of some numbness involving the left arm and hand, and some discomfort into the left shoulder. The patient has had prior cervical spine surgery, she has a history of a cervical myelopathy. She also has a history of a peripheral neuropathy and she still has significant discomfort in the feet at night and she is not sleeping well because of this. The patient takes 600 mg of gabapentin twice daily. She uses a walker for ambulation, she has not had any recent falls. She has a chronic left footdrop following a severe L5 radiculopathy, status post lumbosacral spine surgery. The patient returns to this office for further evaluation.  Past Medical History:  Diagnosis Date  . Allergic rhinitis   . Anemia   . Anxiety   . Bronchiectasis   . Carpal tunnel syndrome 07/13/2015   Bilateral  . CVA (cerebral infarction) 10/2007   Right thalamic   . Diverticulosis of colon   . Gait disorder   . GERD (gastroesophageal reflux disease)   . History of breast cancer   . HTN (hypertension)   . Hyperlipidemia   . Idiopathic pulmonary fibrosis   . Left knee DJD   . Migraine   . Osteoporosis   . Peripheral neuropathy   . PMR (polymyalgia rheumatica) (HCC)   . Polyneuropathy in other diseases classified elsewhere ([employer]) 02/17/2013  . Previous back surgery 10/09/12  . Renal insufficiency   . Seizure disorder ([place])   . Spondylosis, cervical, with myelopathy 08/31/2015   C3-4 myelopathy  . Temporal arteritis (HCC)    Right eye blind, on steroids per [facility]/ Dr [name]  . Type II or  unspecified type diabetes mellitus without mention of complication, not stated as uncontrolled    2nd to steriods  . Vitamin D deficiency     Past Surgical History:  Procedure Laterality Date  . CARPAL TUNNEL RELEASE Right   . CATARACT EXTRACTION     OS - Summer 2010  . CERVICAL FUSION  2010   4 rods and pins in place  . CHOLECYSTECTOMY    . COLONOSCOPY  12/15/2011   Procedure: COLONOSCOPY;  Surgeon: [name], MD;  Location: [facility];  Service: Endoscopy;  Laterality: N/A;  . LUMBAR FUSION  04/2010   W/Mechanical fixation - [place]  2013   rods in hips (to stabilize).  Marland Kitchen MASTECTOMY    . NECK SURGERY    . rheumatoid nodule removal    . SPINAL FUSION  07/2010   T10-L2 interbody fusion / [facility]  . TONSILLECTOMY    . TOTAL KNEE ARTHROPLASTY     Left    Family History  Problem Relation Age of Onset  . Brain cancer Mother   . Hypertension Mother   . Dementia Father   . Hypertension Father   . Breast cancer Sister   . Multiple sclerosis Sister     Social history:  reports that she has never smoked. She has never used smokeless tobacco. She reports that she does not drink alcohol or use drugs.  Allergies  Allergen Reactions  . Hydromorphone Other (See Comments)    Cognitive changes     Medications:  Prior to Admission medications   Medication Sig Start Date End Date Taking? Authorizing Provider  amLODipine (NORVASC) 5 MG tablet Take 1 tablet (5 mg total) by mouth daily. 10/30/16  Yes Burchette, Alinda Sierras, MD  Calcium Carb-Cholecalciferol (CALCIUM CARBONATE-VITAMIN D3 PO) Take 1 tablet by mouth daily   Yes [provider]  cholecalciferol (VITAMIN D) 1000 UNITS tablet Take 1,000 Units by mouth daily.     Yes [provider]  denosumab (PROLIA) 60 MG/ML SOLN Inject 60 mg into the skin every 6 (six) months.     Yes [provider]  diclofenac sodium (VOLTAREN) 1 % GEL Apply 2 g topically 4 (four) times  daily. 09/04/16  Yes Kathrynn Ducking, MD  ferrous sulfate 325 (65 FE) MG tablet Take 325 mg by mouth 2 (two) times daily.    Yes [provider]  furosemide (LASIX) 40 MG tablet TAKE 1 TABLET DAILY 08/27/16  Yes Burchette, Alinda Sierras, MD  gabapentin (NEURONTIN) 100 MG capsule Take 2 capsules (200 mg total) by mouth at bedtime. 09/04/16  Yes Kathrynn Ducking, MD  gabapentin (NEURONTIN) 300 MG capsule Take 2 capsules (600 mg total) by mouth 2 (two) times daily. 04/02/16  Yes Kathrynn Ducking, MD  Golimumab [facility] ARIA IV) Inject into the vein. Takes infusion every 8 wks.   Yes [provider]  KLOR-CON M10 10 MEQ tablet TAKE 1 TABLET TWICE A DAY 08/27/16  Yes Burchette, Alinda Sierras, MD  Multiple Vitamins-Minerals (MULTIVITAMIN,TX-MINERALS) tablet Take 1 tablet by mouth daily.     Yes [provider]  oxyCODONE (OXYCONTIN) 10 mg 12 hr tablet Take 1 tablet (10 mg total) by mouth every 12 (twelve) hours. May refill on or after 04/04/17 12/31/16  Yes Burchette, Alinda Sierras, MD  Oxycodone HCl 10 MG TABS Take 10 mg by mouth every 6 (six) hours as needed.   Yes [provider]  simvastatin (ZOCOR) 5 MG tablet TAKE 1 TABLET AT BEDTIME 07/17/16  Yes Burchette, Alinda Sierras, MD    ROS:  Out of a complete 14 system review of symptoms, the patient complains only of the following symptoms, and all other reviewed systems are negative.  Ringing in the ears Shortness of breath Restless legs Muscle cramps Weakness  Blood pressure 130/75, pulse 71, height 5\' 2"  (1.575 m), weight 146 lb 8 oz (66.5 kg).  Physical Exam  General: The patient is alert and cooperative at the time of the examination.  Skin: No significant peripheral edema is noted.   Neurologic Exam  Mental status: The patient is alert and oriented x 3 at the time of the examination. The patient has apparent normal recent and remote memory, with an apparently normal attention span and concentration ability.   Cranial  nerves: Facial symmetry is present. Speech is normal, no aphasia or dysarthria is noted. Extraocular movements are full, on primary gaze there is exotropia of the right eye. Visual fields are full with the left eye, the right eye is significantly visually impaired.  Motor: The patient has good strength in all 4 extremities, with exception of a left-sided foot drop.  Sensory examination: Soft touch sensation is symmetric on the face, arms, and legs.  Coordination: The patient has good finger-nose-finger and heel-to-shin bilaterally.  Gait and station: The patient is able to walk independently, gait is somewhat wide-based, the patient has a tendency to  have a stooped posture, leaning to the left. The patient normally walks with a walker. Tandem gait was not attempted. Romberg is negative.  Reflexes: Deep tendon reflexes are symmetric.   Assessment/Plan:  1. Peripheral neuropathy  2. Temporal arteritis, the patient is off of prednisone  3. Rheumatoid arthritis  4. Gait disturbance  5. Left arm numbness and discomfort  6. Pulmonary fibrosis  The patient wishes to have another referral to her pulmonologist, she is having slightly increased problems with shortness of breath. The patient is having ongoing discomfort with her peripheral neuropathy, we will add Keppra to take at night for this, 250 mg. The patient was given a prescription for gabapentin. She will be set up for nerve conduction studies on both arms and EMG on the left arm to evaluate the left arm discomfort. We will see her back in 6 months, she will follow-up sooner for the EMG evaluation.  Jill Alexanders MD 03/12/2017 2:40 PM  [facility] [address] [place], [place] [address]  Phone [phone] Fax [phone]

## 2017-04-01 ENCOUNTER — Ambulatory Visit: Payer: Medicare Other | Admitting: Family Medicine

## 2017-04-03 ENCOUNTER — Ambulatory Visit (INDEPENDENT_AMBULATORY_CARE_PROVIDER_SITE_OTHER): Payer: Self-pay | Admitting: Neurology

## 2017-04-03 ENCOUNTER — Encounter: Payer: Self-pay | Admitting: Neurology

## 2017-04-03 ENCOUNTER — Ambulatory Visit (INDEPENDENT_AMBULATORY_CARE_PROVIDER_SITE_OTHER): Payer: Medicare Other | Admitting: Neurology

## 2017-04-03 DIAGNOSIS — M25512 Pain in left shoulder: Secondary | ICD-10-CM

## 2017-04-03 DIAGNOSIS — M542 Cervicalgia: Secondary | ICD-10-CM

## 2017-04-03 DIAGNOSIS — R200 Anesthesia of skin: Secondary | ICD-10-CM

## 2017-04-03 DIAGNOSIS — G8929 Other chronic pain: Secondary | ICD-10-CM

## 2017-04-03 NOTE — Procedures (Signed)
     HISTORY:  [name] is a 76 year old patient with a history of a peripheral neuropathy. She has noted some onset of pain with the left shoulder that is worse with elevating the arm, she also has some discomfort in the base of the thumb on the left. She has some numbness in the hand as well. The patient is being evaluated for possible neuropathy or a cervical radiculopathy. The patient has had prior cervical spine surgery.  NERVE CONDUCTION STUDIES:  Nerve conduction studies were performed on both upper extremities. The distal motor latencies for the median nerves were prolonged bilaterally with low motor amplitudes for these nerves bilaterally. The distal motor latencies for the ulnar nerves were prolonged on the right, normal on the left, with a normal motor amplitude on the left, slightly low on the right. Nerve conduction velocities for the median nerves were slowed on the right, normal on the left, and normal for the ulnar nerves bilaterally. The sensory latencies for the median and ulnar nerves were prolonged bilaterally. The F wave latencies for the ulnar nerves were normal bilaterally.  EMG STUDIES:  EMG study was performed on the left upper extremity:  The first dorsal interosseous muscle reveals 2 to 4 K units with full recruitment. No fibrillations or positive waves were noted. The abductor pollicis brevis muscle reveals 2 to 4 K units with full recruitment. No fibrillations or positive waves were noted. The extensor indicis proprius muscle reveals 1 to 3 K units with full recruitment. No fibrillations or positive waves were noted. The pronator teres muscle reveals 2 to 4 K units with slightly decreased recruitment. No fibrillations or positive waves were noted. The biceps muscle reveals 1 to 4 K units with slightly decreased recruitment. No fibrillations or positive waves were noted. The triceps muscle reveals 2 to 4 K units with full recruitment. No fibrillations or positive  waves were noted. Complex repetitive discharges were seen. The anterior deltoid muscle reveals 2 to 3 K units with full recruitment. No fibrillations or positive waves were noted. The cervical paraspinal muscles were tested at 2 levels. No abnormalities of insertional activity were seen at either level tested, with exception of complex repetitive discharges in the lower level. There was good relaxation.   IMPRESSION:  Nerve conduction studies done on both upper extremities shows evidence of bilateral carpal tunnel syndrome of moderate severity with evidence of a mild primarily distal ulnar neuropathy on the right. EMG evaluation of the left upper extremity shows findings consistent with a chronic mild stable C6 radiculopathy. No other abnormalities were seen.  Jill Alexanders MD 04/03/2017 3:14 PM  [facility] [address] [place], [place] [address]  Phone [phone] Fax [phone]

## 2017-04-03 NOTE — Progress Notes (Signed)
The patient comes in for EMG and nerve conduction study evaluation. The nerve conduction studies show evidence of bilateral carpal tunnel syndrome and a distal right ulnar neuropathy. The patient has underlying history of peripheral neuropathy. The patient reports left shoulder pain that is worse with elevation arm, better with rest.  The patient will be sent for x-ray of the left shoulder. Patient may also have some arthritis in the base of the left thumb.  The patient is reporting increased fatigue coming off of prednisone within the last several weeks. The pain in the left thumb and left shoulder may be unmasked pain of arthritis after coming off of prednisone.

## 2017-04-03 NOTE — Progress Notes (Signed)
Please refer to EMG and nerve conduction study procedure note. 

## 2017-04-04 ENCOUNTER — Other Ambulatory Visit: Payer: Self-pay | Admitting: Neurology

## 2017-04-04 ENCOUNTER — Telehealth: Payer: Self-pay | Admitting: Neurology

## 2017-04-04 ENCOUNTER — Ambulatory Visit
Admission: RE | Admit: 2017-04-04 | Discharge: 2017-04-04 | Disposition: A | Discharge status: Home / Self Care | Payer: Medicare Other | Source: Ambulatory Visit | Attending: Neurology | Admitting: Neurology

## 2017-04-04 DIAGNOSIS — H04222 Epiphora due to insufficient drainage, left lacrimal gland: Secondary | ICD-10-CM | POA: Diagnosis not present

## 2017-04-04 DIAGNOSIS — E559 Vitamin D deficiency, unspecified: Secondary | ICD-10-CM | POA: Diagnosis not present

## 2017-04-04 DIAGNOSIS — H4720 Unspecified optic atrophy: Secondary | ICD-10-CM | POA: Diagnosis not present

## 2017-04-04 DIAGNOSIS — E539 Vitamin B deficiency, unspecified: Secondary | ICD-10-CM | POA: Diagnosis not present

## 2017-04-04 DIAGNOSIS — H43813 Vitreous degeneration, bilateral: Secondary | ICD-10-CM | POA: Diagnosis not present

## 2017-04-04 DIAGNOSIS — R200 Anesthesia of skin: Secondary | ICD-10-CM

## 2017-04-04 DIAGNOSIS — G8929 Other chronic pain: Secondary | ICD-10-CM

## 2017-04-04 DIAGNOSIS — M19012 Primary osteoarthritis, left shoulder: Secondary | ICD-10-CM | POA: Diagnosis not present

## 2017-04-04 DIAGNOSIS — H524 Presbyopia: Secondary | ICD-10-CM | POA: Diagnosis not present

## 2017-04-04 DIAGNOSIS — M25512 Pain in left shoulder: Secondary | ICD-10-CM

## 2017-04-04 DIAGNOSIS — R5383 Other fatigue: Secondary | ICD-10-CM | POA: Diagnosis not present

## 2017-04-04 NOTE — Telephone Encounter (Signed)
I called patient. The x-ray of the left shoulder does show mild-to-moderate arthritic changes, this could be the source of her discomfort.  The patient is seen Dr. Hillery Aldo, I will make referral to him, the patient wishes to be called back on herself on her cell phone at [phone].

## 2017-04-22 ENCOUNTER — Telehealth: Payer: Self-pay | Admitting: Neurology

## 2017-04-22 DIAGNOSIS — M0589 Other rheumatoid arthritis with rheumatoid factor of multiple sites: Secondary | ICD-10-CM | POA: Diagnosis not present

## 2017-04-22 MED ORDER — PREDNISONE 1 MG PO TABS: 4.0000 mg | ORAL_TABLET | Freq: Every day | ORAL | 4 refills | Status: DC

## 2017-04-22 NOTE — Telephone Encounter (Signed)
I called patient. She is not feeling well off of the prednisone, we have discussed this on her last revisit, I will call in a low dose of the prednisone starting on the 1 mg tablets taking 4 daily.

## 2017-04-22 NOTE — Addendum Note (Signed)
Addended by: Kathrynn Ducking on: 04/22/2017 01:00 PM   Modules accepted: Orders

## 2017-04-22 NOTE — Telephone Encounter (Signed)
Patient called office in reference to being taken off of prednisone.  Patient said she was tired, achy all over, nausea, stumbling around, and not eating.  Patient would like to know if she can be put back on a low dose of prednisone.  Patient would like to speak with Dr. [name] about this.  [facility]).

## 2017-04-23 ENCOUNTER — Other Ambulatory Visit: Payer: Self-pay | Admitting: Family Medicine

## 2017-04-23 MED ORDER — PREDNISONE 1 MG PO TABS: 4.0000 mg | ORAL_TABLET | Freq: Every day | ORAL | 4 refills | Status: DC

## 2017-04-23 NOTE — Addendum Note (Signed)
Addended by: Rossie Muskrat L on: 04/23/2017 01:07 PM   Modules accepted: Orders

## 2017-04-23 NOTE — Telephone Encounter (Signed)
Patient called office in reference to Prednisone.  Patient needs medication called into pharmacy listed below.  Pharmacy- [facility]).  FYI

## 2017-04-23 NOTE — Telephone Encounter (Signed)
[facility] Spoke with Ed. He sent request to cancel order for rx prednisone. Advised this was done in error. Sent to local pharmacy.  He verbalized understanding.

## 2017-04-23 NOTE — Telephone Encounter (Signed)
Re-sent rx to correct pharmacy as requested.

## 2017-04-24 DIAGNOSIS — H04222 Epiphora due to insufficient drainage, left lacrimal gland: Secondary | ICD-10-CM | POA: Diagnosis not present

## 2017-04-25 ENCOUNTER — Other Ambulatory Visit (INDEPENDENT_AMBULATORY_CARE_PROVIDER_SITE_OTHER): Payer: Medicare Other

## 2017-04-25 ENCOUNTER — Encounter: Payer: Self-pay | Admitting: Pulmonary Disease

## 2017-04-25 ENCOUNTER — Ambulatory Visit (INDEPENDENT_AMBULATORY_CARE_PROVIDER_SITE_OTHER): Payer: Medicare Other | Admitting: Pulmonary Disease

## 2017-04-25 VITALS — BP 130/72 | HR 70 | Ht 61.5 in | Wt 141.6 lb

## 2017-04-25 DIAGNOSIS — M069 Rheumatoid arthritis, unspecified: Secondary | ICD-10-CM

## 2017-04-25 DIAGNOSIS — J849 Interstitial pulmonary disease, unspecified: Secondary | ICD-10-CM

## 2017-04-25 LAB — COMPREHENSIVE METABOLIC PANEL
ALK PHOS: 85 U/L (ref 39–117)
ALT: 25 U/L (ref 0–35)
AST: 34 U/L (ref 0–37)
Albumin: 4 g/dL (ref 3.5–5.2)
BILIRUBIN TOTAL: 0.8 mg/dL (ref 0.2–1.2)
BUN: 22 mg/dL (ref 6–23)
CALCIUM: 10.1 mg/dL (ref 8.4–10.5)
CO2: 35 mEq/L — ABNORMAL HIGH (ref 19–32)
CREATININE: 1.11 mg/dL (ref 0.40–1.20)
Chloride: 99 mEq/L (ref 96–112)
GFR: 50.66 mL/min — AB (ref >60.00)
Glucose, Bld: 104 mg/dL — ABNORMAL HIGH (ref 70–99)
Potassium: 3.6 mEq/L (ref 3.5–5.1)
Sodium: 143 mEq/L (ref 135–145)
TOTAL PROTEIN: 7.1 g/dL (ref 6.0–8.3)

## 2017-04-25 LAB — CBC WITH DIFFERENTIAL/PLATELET
BASOS ABS: 0.1 10*3/uL (ref 0.0–0.1)
Basophils Relative: 0.9 % (ref 0.0–3.0)
EOS ABS: 0.5 10*3/uL (ref 0.0–0.7)
Eosinophils Relative: 6.5 % — ABNORMAL HIGH (ref 0.0–5.0)
HCT: 42.5 % (ref 36.0–46.0)
Hemoglobin: 14.1 g/dL (ref 12.0–15.0)
LYMPHS ABS: 1.5 10*3/uL (ref 0.7–4.0)
LYMPHS PCT: 18.9 % (ref 12.0–46.0)
MCHC: 33.2 g/dL (ref 30.0–36.0)
MCV: 93.2 fl (ref 78.0–100.0)
MONOS PCT: 4.4 % (ref 3.0–12.0)
Monocytes Absolute: 0.4 10*3/uL (ref 0.1–1.0)
NEUTROS PCT: 69.3 % (ref 43.0–77.0)
Neutro Abs: 5.6 10*3/uL (ref 1.4–7.7)
Platelets: 274 10*3/uL (ref 150.0–400.0)
RBC: 4.56 Mil/uL (ref 3.87–5.11)
RDW: 13.3 % (ref 11.5–15.5)
WBC: 8.1 10*3/uL (ref 4.0–10.5)

## 2017-04-25 LAB — SEDIMENTATION RATE: SED RATE: 40 mm/h — AB (ref 0–30)

## 2017-04-25 LAB — C-REACTIVE PROTEIN: CRP: 0.3 mg/dL — AB (ref 0.5–20.0)

## 2017-04-25 NOTE — Patient Instructions (Addendum)
   Call me if you have any new breathing problems before your next appointment.  We will review your test results when I see you back or sooner if needed.   TESTS ORDERED: 1. HRCT Chest w/o with prone & supine imaging 2. Full PFTs before next appointment 3. 6MWT on room air before next appointment 4. Overnight oximetry on room air  5. Serum CMP, CBC, ANA, ESR, CRP, Myositis Panel, Smith Antibody, DS DNA Antibody, Sjogrens Panel, Anti-CCP, Rheumatoid Factor, Centromere Antibody Screen, SCL-70, Histone Antibody, & Hypersensitivity Pneumonitis Panel.

## 2017-04-25 NOTE — Progress Notes (Signed)
Subjective:    Patient ID: [name], female    DOB: [birth year], 76 y.o.   MRN: [id number]  HPI Patient was seen over 10 years ago by Dr. Joya Gaskins. She has been treated with Prednisone & continued on Prednisone and tapered to 4mg  of Prednisone gradually over the last 2 years. Dr. [name] had attempted to come off Prednisone a couple of months ago but had to restart due to adverse effects. She reports her breathing is "pretty good" but she does have to deep breathe or yawn intentionally intermittently. She reports she has noticed a slightly increased amount of dyspnea in the last 6 months. She reports she does have dyspnea with anxiety. She reports she is able to walk short distances due to her dyspnea but this resolves with brief rest. She denies any coughing or wheezing. No chest pain, tightness, or pressure. No rashes but does bruise easily. No dysphagia, odynophagia, reflux, dyspepsia, or morning brash water taste. Does have have significant sinus congestion & post nasal drainage. She denies any seasonal variation & it is fairly constant year round. She has multiple joint pains. She reports she was diagnosed with Rheumatoid Arthritis by Dr. Marijean Bravo some time ago. She is on Simponi Aria to help with this. She reports she still has significant pain & stiffness in her hands and feet. She reports she was treated only with surgical removal for her breast cancer, no chemotherapy or XRT. She denies any history of chemotherapy whatsoever. She denies any exposure to Amiodarone. She believes she may have remotely been on Methotrexate for her Rheumatoid Arthritis but none recently. She previously lost sight in her right eye but no other focal weakness, numbness, or tingling. She reports she has had pneumonia a "couple of times" remotely, in the 1980s maybe.  Review of Systems No fever, chills, or sweats. She does have to urinate frequently but denies any dysuria or hematuria. A pertinent 14 point review of  systems is negative except as per the history of presenting illness.  Allergies  Allergen Reactions  . Hydromorphone Other (See Comments)    Cognitive changes     Current Outpatient Prescriptions on File Prior to Visit  Medication Sig Dispense Refill  . amLODipine (NORVASC) 5 MG tablet Take 1 tablet (5 mg total) by mouth daily. 90 tablet 3  . Calcium Carb-Cholecalciferol (CALCIUM CARBONATE-VITAMIN D3 PO) Take 1 tablet by mouth daily    . cholecalciferol (VITAMIN D) 1000 UNITS tablet Take 1,000 Units by mouth daily.      Marland Kitchen denosumab (PROLIA) 60 MG/ML SOLN Inject 60 mg into the skin every 6 (six) months.      . diclofenac sodium (VOLTAREN) 1 % GEL Apply 2 g topically 4 (four) times daily. 4 Tube 3  . ferrous sulfate 325 (65 FE) MG tablet Take 325 mg by mouth 2 (two) times daily.     . furosemide (LASIX) 40 MG tablet TAKE 1 TABLET DAILY 90 tablet 3  . gabapentin (NEURONTIN) 300 MG capsule Take 2 capsules (600 mg total) by mouth 2 (two) times daily. 60 capsule 0  . Golimumab ([place] ARIA IV) Inject into the vein. Takes infusion every 8 wks.    Marland Kitchen KLOR-CON M10 10 MEQ tablet TAKE 1 TABLET TWICE A DAY 180 tablet 3  . Multiple Vitamins-Minerals (MULTIVITAMIN,TX-MINERALS) tablet Take 1 tablet by mouth daily.      Marland Kitchen oxyCODONE (OXYCONTIN) 10 mg 12 hr tablet Take 1 tablet (10 mg total) by mouth every 12 (twelve) hours. May  refill on or after 04/04/17 60 tablet 0  . Oxycodone HCl 10 MG TABS Take 10 mg by mouth every 6 (six) hours as needed.    . predniSONE (DELTASONE) 1 MG tablet Take 4 tablets (4 mg total) by mouth daily with breakfast. 120 tablet 4  . simvastatin (ZOCOR) 5 MG tablet TAKE 1 TABLET AT BEDTIME 90 tablet 2  . [DISCONTINUED] potassium chloride (K-DUR) 10 MEQ tablet Take 1 tablet (10 mEq total) by mouth 2 (two) times daily. 180 tablet 0   No current facility-administered medications on file prior to visit.     Past Medical History:  Diagnosis Date  . Allergic rhinitis   . Anemia   .  Anxiety   . Bronchiectasis   . Carpal tunnel syndrome 07/13/2015   Bilateral  . CVA (cerebral infarction) 10/2007   Right thalamic   . Diverticulosis of colon   . Gait disorder   . GERD (gastroesophageal reflux disease)   . History of breast cancer   . HTN (hypertension)   . Hyperlipidemia   . Idiopathic pulmonary fibrosis   . Left knee DJD   . Migraine   . Osteoporosis   . Peripheral neuropathy   . PMR (polymyalgia rheumatica) (HCC)   . Polyneuropathy in other diseases classified elsewhere ([place]) 02/17/2013  . Previous back surgery 10/09/12  . Renal insufficiency   . Rheumatoid arthritis ([place])   . Seizure disorder ([place])   . Spondylosis, cervical, with myelopathy 08/31/2015   C3-4 myelopathy  . Temporal arteritis (HCC)    Right eye blind, on steroids per [facility]/ Dr [name]  . Type II or unspecified type diabetes mellitus without mention of complication, not stated as uncontrolled    2nd to steriods  . Vitamin D deficiency     Past Surgical History:  Procedure Laterality Date  . CARPAL TUNNEL RELEASE Right   . CATARACT EXTRACTION     OS - Summer 2010  . CERVICAL FUSION  2010   4 rods and pins in place  . CHOLECYSTECTOMY    . COLONOSCOPY  12/15/2011   Procedure: COLONOSCOPY;  Surgeon: [name], MD;  Location: [facility];  Service: Endoscopy;  Laterality: N/A;  . LUMBAR FUSION  04/2010   W/Mechanical fixation - [place]  2013   rods in hips (to stabilize).  Marland Kitchen MASTECTOMY    . NECK SURGERY    . rheumatoid nodule removal    . SPINAL FUSION  07/2010   T10-L2 interbody fusion / [facility]  . TONSILLECTOMY    . TOTAL KNEE ARTHROPLASTY     Left    Family History  Problem Relation Age of Onset  . Brain cancer Mother   . Hypertension Mother   . Arthritis Mother   . Dementia Father   . Hypertension Father   . Arthritis Father   . Breast cancer Sister   . Lung cancer Sister   . Lung disease Neg Hx   . Rheumatologic disease Neg Hx      Social History   Social History  . Marital status: Widowed    Spouse name: N/A  . Number of children: 1  . Years of education: 12+ coll.   Occupational History  . Land Retired    retired   Social History Main Topics  . Smoking status: Passive Smoke Exposure - Never Smoker    Types: Cigarettes  . Smokeless tobacco: Never Used     Comment:  Father   . Alcohol use No     Comment: heavy drinker until 1995 - sobriety with AA  . Drug use: No  . Sexual activity: Not Currently   Other Topics Concern  . None   Social History Narrative      Married - husband invalid with stroke - widowed in 2009. 1 chil. retired - [employer]. Lives alone. ACP - not formerly discussed - wishes to be a full code.    Patient is right handed. Patient consumes tea 4  times daily.         Patient drinks about 4-5 cups of caffeine daily.   Patient is right handed.       [facility] (04/25/17):   Originally from [place], [place]. Previously lived in [place]. 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 No bird or mold exposure. No hot tub exposure. Has traveled to [place], [place], and [place] No pets currently. Enjoys going to the beach.             Objective:   Physical Exam BP 130/72 (BP Location: Right Arm, Patient Position: Sitting, Cuff Size: Normal)   Pulse 70   Ht 5' 1.5" (1.562 m)   Wt 141 lb 9.6 oz (64.2 kg)   SpO2 98%   BMI 26.32 kg/m  General:  Awake. Alert. No acute distress. Elderly female.  Integument:  Warm & dry. No rash on exposed skin.  Extremities:  No cyanosis or clubbing.  Lymphatics:  No appreciated cervical or supraclavicular lymphadenoapthy. HEENT:  Moist mucus membranes. No oral ulcers. No nasal turbinate swelling. No scleral icterus. Cardiovascular:  Regular rate. No edema. 3/6 systolic ejection murmur. Regular rhythm.  Pulmonary:   Mild bilateral basilar Velcro crackles. Symmetric chest wall expansion. No accessory muscle use on room air. Abdomen: Soft. Normal bowel sounds. Nondistended. Grossly nontender. Musculoskeletal:  Normal bulk and tone. Hand grip strength 5/5 bilaterally. No joint effusion appreciated. Synovial thickening with deformity of the DIP joints bilaterally. Synovial thickening of bilateral PIP joints as well. Neurological:  CN 2-12 grossly in tact. No meningismus. Moving all 4 extremities equally. Symmetric brachioradialis deep tendon reflexes. Psychiatric:  Mood and affect congruent. Speech normal rhythm, rate & tone.   PFT 03/16/09: FVC 2.63 L (95%) FEV1 2.17 L (110%) FEV1/FVC 0.83 FEF 25-75 2.88 L (126%) negative bronchodilator response TLC 3.83 L (83%) RV 57% ERV 76% DLCO uncorrected 49%  IMAGING HRCT CHEST W/O 11/6/7 (personally reviewed by me):  Peripheral distribution of reticulation that is primarily subpleural. With some areas of bronchiectasis associated with intralobular septal thickening and fibrotic changes. 1.3 cm cyst noted within left lower lobe as well as a small amount of honeycomb changes. Cranial caudal progression of disease. No pleural thickening or effusion. No pericardial effusion. Borderline precarinal and subcarinal lymph nodes. Pattern is consistent with UIP.   CARDIAC TTE (09/04/12):  LV normal in size with mild LVH. EF 60-65%. No regional wall motion abnormalities & grade 1 diastolic dysfunction. LA & RA normal in size. RV normal in size and function. No aortic stenosis or regurgitation. Aortic root normal in size. No mitral stenosis or regurgitation. No significant pulmonic regurgitation. No tricuspid regurgitation.  LABS 2/8/9 ANA:  Negative (<1:10) IgG:  1240 IgA:  364    Assessment & Plan:  76 y.o. Female with long-standing history of interstitial lung disease. By my review the patient does have a UIP pattern on her  high-resolution CT scan from 2007. Patient reports  rheumatoid arthritis by history and is following with rheumatology. She has no other exposures that would suggest a possible cause for her interstitial lung disease. Given the chronicity of her disease and the fact that she is still living I'm highly skeptical that this represents idiopathic pulmonary fibrosis and suspect this UIP pattern is more secondary to her underlying rheumatological disease. She may have some element of progression as her current monoclonal antibody is unknown to me and I question whether or not it could be treating her underlying rheumatoid lung at all. Even so, she is on a small dose of prednisone which may be helping some. I instructed the patient contact me if she had any new breathing problems or questions before her next appointment.  1. ILD:  Repeating high-resolution CT chest without contrast. Checking full pulmonary function testing, 6 minute walk test, and overnight oximetry on room air. Performance serum autoimmune workup. 2. Rheumatoid arthritis: Following with rheumatology. Performance serum autoimmune workup. Patient continuing on monoclonal antibody therapy as per rheumatology.  3. Health maintenance: Status post Prevnar November 2014 & Pneumovax October 2007. 4. Follow-up: Patient to return to clinic in 6 weeks or sooner if needed.  Sonia Baller Ashok Cordia, M.D. [facility] Pager:  [phone] After 3pm or if no response, call [phone] 9:20 AM 04/25/17

## 2017-04-26 ENCOUNTER — Ambulatory Visit (INDEPENDENT_AMBULATORY_CARE_PROVIDER_SITE_OTHER): Payer: Medicare Other | Admitting: Family Medicine

## 2017-04-26 ENCOUNTER — Encounter: Payer: Self-pay | Admitting: Family Medicine

## 2017-04-26 VITALS — BP 120/70 | HR 73 | Temp 98.1°F | Ht 61.0 in | Wt 142.7 lb

## 2017-04-26 DIAGNOSIS — G8929 Other chronic pain: Secondary | ICD-10-CM | POA: Diagnosis not present

## 2017-04-26 DIAGNOSIS — I10 Essential (primary) hypertension: Secondary | ICD-10-CM

## 2017-04-26 DIAGNOSIS — M545 Low back pain: Secondary | ICD-10-CM

## 2017-04-26 DIAGNOSIS — J8410 Pulmonary fibrosis, unspecified: Secondary | ICD-10-CM

## 2017-04-26 DIAGNOSIS — M069 Rheumatoid arthritis, unspecified: Secondary | ICD-10-CM

## 2017-04-26 DIAGNOSIS — Z0000 Encounter for general adult medical examination without abnormal findings: Secondary | ICD-10-CM

## 2017-04-26 LAB — SJOGREN'S SYNDROME ANTIBODS(SSA + SSB)
SSA (RO) (ENA) ANTIBODY, IGG: NEGATIVE
SSB (LA) (ENA) ANTIBODY, IGG: NEGATIVE

## 2017-04-26 LAB — CYCLIC CITRUL PEPTIDE ANTIBODY, IGG: Cyclic Citrullin Peptide Ab: <16 Units

## 2017-04-26 LAB — ANTI-DNA ANTIBODY, DOUBLE-STRANDED

## 2017-04-26 LAB — RHEUMATOID FACTOR

## 2017-04-26 LAB — CENTROMERE ANTIBODIES

## 2017-04-26 LAB — ANTI-SMITH ANTIBODY: ENA SM AB SER-ACNC: NEGATIVE

## 2017-04-26 MED ORDER — OXYCODONE HCL ER 10 MG PO T12A: 10.0000 mg | EXTENDED_RELEASE_TABLET | Freq: Two times a day (BID) | ORAL | 0 refills | Status: DC

## 2017-04-26 MED ORDER — OXYCODONE HCL 10 MG PO TABS: 10.0000 mg | ORAL_TABLET | Freq: Four times a day (QID) | ORAL | 0 refills | Status: DC | PRN

## 2017-04-26 NOTE — Patient Instructions (Addendum)
[name] , Thank you for taking time to come for your Medicare Wellness Visit. I appreciate your ongoing commitment to your health goals. Please review the following plan we discussed and let me know if I can assist you in the future.   [facility] Address: [address], [place], [place] [address]  Phone: [phone]    [facility]; [phone] [facility] [place]; [phone] Get resource to get information on any and all community programs for Seniors  [place]: 8317187653 Community Health Response Program -732-202-5427 Public Health Dept; Need to be a skilled visit but can assist with bathing as well; [phone]  Adult center for Enrichment;  Call Senior Line; [phone]  Adult day services include Adult Day Care, Adult Day Healthcare, Group Respite, Care [facility], Volunteer [employer], Education and Support Program   Dept of Social Services; Call [phone] and ask for SW on call  Options for Medicaid include the Community Alternatives program; [facility] (personal care services) or PACE program, which is a medical and social program combined  Braulio Conte manages the community Alternatives program at the [employer]; [phone] (this is a program with a waiting list but provides SNF care at home;  [facility] 2 required; Call Claiborne Billings and she will send out packet of information  Skilled home care vs skilled facility   [facility] Address: [address], [place], [place] [address]  Phone: [phone]   [employer] general resources for food etc   Http://nihseniorhealth.giv     Resource to find disability equipment (used) online; [employer]   A Tetanus is recommended every 10 years. Medicare covers a tetanus if you have a cut or wound; otherwise, there may be a charge. If you had not had a tetanus with pertusses, known as the Tdap, you can take this anytime.    Shingrix is a vaccine for the prevention of Shingles in Adults 50 and older.  If you are on Medicare, you can request a prescription from your doctor to be filled at a pharmacy.  Please check with your benefits regarding applicable copays or out of pocket expenses.  The Shingrix is given in 2 vaccines approx 8 weeks apart. You must receive the 2nd dose prior to 6 months from receipt of the first.       These are the goals we discussed: Goals    . Eat more fruits and vegetables          Would like to increase her vegetables K and w can pick up vegetables for a week       This is a list of the screening recommended for you and due dates:  Health Maintenance  Topic Date Due  . Complete foot exam   05/04/1951  . Tetanus Vaccine  10/27/2013  . Flu Shot  04/24/2017  . Hemoglobin A1C  04/25/2018*  . Eye exam for diabetics  04/25/2018*  . Mammogram  06/13/2017  . DEXA scan (bone density measurement)  Completed  . Pneumonia vaccines  Completed  *Topic was postponed. The date shown is not the original due date.   Health Maintenance, Female Adopting a healthy lifestyle and getting preventive care can go a long way to promote health and wellness. Talk with your health care provider about what schedule of regular examinations is right for you. This is a good chance for you to check in with your provider about disease prevention and staying healthy. In between checkups, there are plenty of things you can do  on your own. Experts have done a lot of research about which lifestyle changes and preventive measures are most likely to keep you healthy. Ask your health care provider for more information. Weight and diet Eat a healthy diet  Be sure to include plenty of vegetables, fruits, low-fat dairy products, and lean protein.  Do not eat a lot of foods high in solid fats, added sugars, or salt.  Get regular exercise. This is one of the most important things you can do for your  health. ? Most adults should exercise for at least 150 minutes each week. The exercise should increase your heart rate and make you sweat (moderate-intensity exercise). ? Most adults should also do strengthening exercises at least twice a week. This is in addition to the moderate-intensity exercise.  Maintain a healthy weight  Body mass index (BMI) is a measurement that can be used to identify possible weight problems. It estimates body fat based on height and weight. Your health care provider can help determine your BMI and help you achieve or maintain a healthy weight.  For females 6 years of age and older: ? A BMI below 18.5 is considered underweight. ? A BMI of 18.5 to 24.9 is normal. ? A BMI of 25 to 29.9 is considered overweight. ? A BMI of 30 and above is considered obese.  Watch levels of cholesterol and blood lipids  You should start having your blood tested for lipids and cholesterol at 77 years of age, then have this test every 5 years.  You may need to have your cholesterol levels checked more often if: ? Your lipid or cholesterol levels are high. ? You are older than 77 years of age. ? You are at high risk for heart disease.  Cancer screening Lung Cancer  Lung cancer screening is recommended for adults 2-83 years old who are at high risk for lung cancer because of a history of smoking.  A yearly low-dose CT scan of the lungs is recommended for people who: ? Currently smoke. ? Have quit within the past 15 years. ? Have at least a 30-pack-year history of smoking. A pack year is smoking an average of one pack of cigarettes a day for 1 year.  Yearly screening should continue until it has been 15 years since you quit.  Yearly screening should stop if you develop a health problem that would prevent you from having lung cancer treatment.  Breast Cancer  Practice breast self-awareness. This means understanding how your breasts normally appear and feel.  It also means  doing regular breast self-exams. Let your health care provider know about any changes, no matter how small.  If you are in your 20s or 30s, you should have a clinical breast exam (CBE) by a health care provider every 1-3 years as part of a regular health exam.  If you are 23 or older, have a CBE every year. Also consider having a breast X-ray (mammogram) every year.  If you have a family history of breast cancer, talk to your health care provider about genetic screening.  If you are at high risk for breast cancer, talk to your health care provider about having an MRI and a mammogram every year.  Breast cancer gene (BRCA) assessment is recommended for women who have family members with BRCA-related cancers. BRCA-related cancers include: ? Breast. ? Ovarian. ? Tubal. ? Peritoneal cancers.  Results of the assessment will determine the need for genetic counseling and BRCA1 and BRCA2 testing.  Cervical  Cancer Your health care provider may recommend that you be screened regularly for cancer of the pelvic organs (ovaries, uterus, and vagina). This screening involves a pelvic examination, including checking for microscopic changes to the surface of your cervix (Pap test). You may be encouraged to have this screening done every 3 years, beginning at age 28.  For women ages 77-65, health care providers may recommend pelvic exams and Pap testing every 3 years, or they may recommend the Pap and pelvic exam, combined with testing for human papilloma virus (HPV), every 5 years. Some types of HPV increase your risk of cervical cancer. Testing for HPV may also be done on women of any age with unclear Pap test results.  Other health care providers may not recommend any screening for nonpregnant women who are considered low risk for pelvic cancer and who do not have symptoms. Ask your health care provider if a screening pelvic exam is right for you.  If you have had past treatment for cervical cancer or a  condition that could lead to cancer, you need Pap tests and screening for cancer for at least 20 years after your treatment. If Pap tests have been discontinued, your risk factors (such as having a new sexual partner) need to be reassessed to determine if screening should resume. Some women have medical problems that increase the chance of getting cervical cancer. In these cases, your health care provider may recommend more frequent screening and Pap tests.  Colorectal Cancer  This type of cancer can be detected and often prevented.  Routine colorectal cancer screening usually begins at 77 years of age and continues through 77 years of age.  Your health care provider may recommend screening at an earlier age if you have risk factors for colon cancer.  Your health care provider may also recommend using home test kits to check for hidden blood in the stool.  A small camera at the end of a tube can be used to examine your colon directly (sigmoidoscopy or colonoscopy). This is done to check for the earliest forms of colorectal cancer.  Routine screening usually begins at age 67.  Direct examination of the colon should be repeated every 5-10 years through 77 years of age. However, you may need to be screened more often if early forms of precancerous polyps or small growths are found.  Skin Cancer  Check your skin from head to toe regularly.  Tell your health care provider about any new moles or changes in moles, especially if there is a change in a mole's shape or color.  Also tell your health care provider if you have a mole that is larger than the size of a pencil eraser.  Always use sunscreen. Apply sunscreen liberally and repeatedly throughout the day.  Protect yourself by wearing long sleeves, pants, a wide-brimmed hat, and sunglasses whenever you are outside.  Heart disease, diabetes, and high blood pressure  High blood pressure causes heart disease and increases the risk of stroke.  High blood pressure is more likely to develop in: ? People who have blood pressure in the high end of the normal range (130-139/85-89 mm Hg). ? People who are overweight or obese. ? People who are African American.  If you are 36-64 years of age, have your blood pressure checked every 3-5 years. If you are 79 years of age or older, have your blood pressure checked every year. You should have your blood pressure measured twice-once when you are at a hospital or  clinic, and once when you are not at a hospital or clinic. Record the average of the two measurements. To check your blood pressure when you are not at a hospital or clinic, you can use: ? An automated blood pressure machine at a pharmacy. ? A home blood pressure monitor.  If you are between 58 years and 67 years old, ask your health care provider if you should take aspirin to prevent strokes.  Have regular diabetes screenings. This involves taking a blood sample to check your fasting blood sugar level. ? If you are at a normal weight and have a low risk for diabetes, have this test once every three years after 77 years of age. ? If you are overweight and have a high risk for diabetes, consider being tested at a younger age or more often. Preventing infection Hepatitis B  If you have a higher risk for hepatitis B, you should be screened for this virus. You are considered at high risk for hepatitis B if: ? You were born in a country where hepatitis B is common. Ask your health care provider which countries are considered high risk. ? Your parents were born in a high-risk country, and you have not been immunized against hepatitis B (hepatitis B vaccine). ? You have HIV or AIDS. ? You use needles to inject street drugs. ? You live with someone who has hepatitis B. ? You have had sex with someone who has hepatitis B. ? You get hemodialysis treatment. ? You take certain medicines for conditions, including cancer, organ transplantation, and  autoimmune conditions.  Hepatitis C  Blood testing is recommended for: ? Everyone born from [birth year] through 1966. ? Anyone with known risk factors for hepatitis C.  Sexually transmitted infections (STIs)  You should be screened for sexually transmitted infections (STIs) including gonorrhea and chlamydia if: ? You are sexually active and are younger than 77 years of age. ? You are older than 77 years of age and your health care provider tells you that you are at risk for this type of infection. ? Your sexual activity has changed since you were last screened and you are at an increased risk for chlamydia or gonorrhea. Ask your health care provider if you are at risk.  If you do not have HIV, but are at risk, it may be recommended that you take a prescription medicine daily to prevent HIV infection. This is called pre-exposure prophylaxis (PrEP). You are considered at risk if: ? You are sexually active and do not regularly use condoms or know the HIV status of your partner(s). ? You take drugs by injection. ? You are sexually active with a partner who has HIV.  Talk with your health care provider about whether you are at high risk of being infected with HIV. If you choose to begin PrEP, you should first be tested for HIV. You should then be tested every 3 months for as long as you are taking PrEP. Pregnancy  If you are premenopausal and you may become pregnant, ask your health care provider about preconception counseling.  If you may become pregnant, take 400 to 800 micrograms (mcg) of folic acid every day.  If you want to prevent pregnancy, talk to your health care provider about birth control (contraception). Osteoporosis and menopause  Osteoporosis is a disease in which the bones lose minerals and strength with aging. This can result in serious bone fractures. Your risk for osteoporosis can be identified using a bone density scan.  If you are 56 years of age or older, or if you are at  risk for osteoporosis and fractures, ask your health care provider if you should be screened.  Ask your health care provider whether you should take a calcium or vitamin D supplement to lower your risk for osteoporosis.  Menopause may have certain physical symptoms and risks.  Hormone replacement therapy may reduce some of these symptoms and risks. Talk to your health care provider about whether hormone replacement therapy is right for you. Follow these instructions at home:  Schedule regular health, dental, and eye exams.  Stay current with your immunizations.  Do not use any tobacco products including cigarettes, chewing tobacco, or electronic cigarettes.  If you are pregnant, do not drink alcohol.  If you are breastfeeding, limit how much and how often you drink alcohol.  Limit alcohol intake to no more than 1 drink per day for nonpregnant women. One drink equals 12 ounces of beer, 5 ounces of wine, or 1 ounces of hard liquor.  Do not use street drugs.  Do not share needles.  Ask your health care provider for help if you need support or information about quitting drugs.  Tell your health care provider if you often feel depressed.  Tell your health care provider if you have ever been abused or do not feel safe at home. This information is not intended to replace advice given to you by your health care provider. Make sure you discuss any questions you have with your health care provider. Document Released: 03/26/2011 Document Revised: 02/16/2016 Document Reviewed: 06/14/2015 Elsevier Interactive Patient Education  [employer].

## 2017-04-26 NOTE — Progress Notes (Addendum)
Subjective:   [name] is a 76 y.o. female who presents for Medicare Annual (Subsequent) preventive examination.  The Patient was informed that the wellness visit is to identify future health risk and educate and initiate measures that can reduce risk for increased disease through the lifespan.    Annual Wellness Assessment  Reports health as Better today. With having some nausea and vomiting and fatigue due to prednisone change but now taking prednisone 4 mg every day and is much improved.  Preventive Screening -Counseling & Management  Medicare Annual Preventive Care Visit - Subsequent Last OV [name] seeing Dr. Elease Hashimoto today  [place] as poor, fair, good or great? Describes her health as challenged but better now.   States feet started bothering her. This made her more disabled Left foot turning out and hurt at hs  Uses volteren gel at hs    Lives alone since Jan 08, 2008 Spouse passed away Lost sight in od that can see well out of her left eye. This does not seem to create a problem for her. Home is one level and she has had no falls recently.  spouse was an invalid so home is handi-capped Does have house keeper Does her own shopping  Does not cook anymore; eats out Goes out w friends Visits her sister / likes movies Discussed general mobility  Aging in place at home  No family   Diet-  does not eat breakfast Eats at 11 am sandwich and fruit 5pm eats a lean cusine  Or may go out to eat   Weight loss since Jan of 2018/ Eats in moderation Got rid of sugary snacks ice cream etc Started cutting back on desserts She weighed 215 and today 144  BMI 26  Exercise tries to get out everyday Waunita Schooner Mrs. Jalomo references on pools that may be available for her with handicapped lives in the community. Will send info on sitter exercise or exercise in bed   Sleep patterns: may sleep x 5 hours  Patient Care Team: Eulas Post, MD as PCP - General  (Family Medicine) Kathrynn Ducking, MD (Neurology) Assessed for additional providers  Immunization History  Administered Date(s) Administered  . H1N1 10/19/2008  . Influenza Split 07/20/2012  . Influenza Whole 06/28/2006, 07/08/2007, 07/07/2008, 07/07/2009  . Influenza, High Dose Seasonal PF 07/14/2013  . Influenza,inj,Quad PF,36+ Mos 07/07/2014  . Influenza-Unspecified 07/15/2015, 07/24/2016  . Pneumococcal Conjugate-13 07/28/2013  . Pneumococcal Polysaccharide-23 06/28/2006  . Td 10/28/2003  . Zoster 07/07/2008   Required Immunizations needed today  Screening test up to date or reviewed for plan of completion Health Maintenance Due  Topic Date Due  . FOOT EXAM  05/04/1951  . TETANUS/TDAP  10/27/2013  . INFLUENZA VACCINE  04/24/2017    Dr Elease Hashimoto  Foot assessed today  Eye exam this month/ Dr Kathrin Penner        Cardiac Risk Factors include: advanced age (>30men, >33 women);hypertension;sedentary lifestyle     Objective:     Vitals: BP 120/70 (BP Location: Right Arm, Patient Position: Sitting, Cuff Size: Normal)   Pulse 73   Temp 98.1 F (36.7 C) (Oral)   Ht 5\' 1"  (1.549 m)   Wt 142 lb 11.2 oz (64.7 kg)   SpO2 96%   BMI 26.96 kg/m   Body mass index is 26.96 kg/m.   Tobacco History  Smoking Status  . Passive Smoke Exposure - Never Smoker  . Types: Cigarettes  Smokeless Tobacco  . Never Used  Comment: Father      Counseling given: Not Answered   Past Medical History:  Diagnosis Date  . Allergic rhinitis   . Anemia   . Anxiety   . Bronchiectasis   . Carpal tunnel syndrome 07/13/2015   Bilateral  . CVA (cerebral infarction) 10/2007   Right thalamic   . Diverticulosis of colon   . Gait disorder   . GERD (gastroesophageal reflux disease)   . History of breast cancer   . HTN (hypertension)   . Hyperlipidemia   . Idiopathic pulmonary fibrosis   . Left knee DJD   . Migraine   . Osteoporosis   . Peripheral neuropathy   . PMR  (polymyalgia rheumatica) (HCC)   . Polyneuropathy in other diseases classified elsewhere ([place]) 02/17/2013  . Previous back surgery 10/09/12  . Renal insufficiency   . Rheumatoid arthritis ([place])   . Seizure disorder ([place])   . Spondylosis, cervical, with myelopathy 08/31/2015   C3-4 myelopathy  . Temporal arteritis (HCC)    Right eye blind, on steroids per [facility]/ Dr [name]  . Type II or unspecified type diabetes mellitus without mention of complication, not stated as uncontrolled    2nd to steriods  . Vitamin D deficiency    Past Surgical History:  Procedure Laterality Date  . CARPAL TUNNEL RELEASE Right   . CATARACT EXTRACTION     OS - Summer 2010  . CERVICAL FUSION  2010   4 rods and pins in place  . CHOLECYSTECTOMY    . COLONOSCOPY  12/15/2011   Procedure: COLONOSCOPY;  Surgeon: [name], MD;  Location: [facility];  Service: Endoscopy;  Laterality: N/A;  . LUMBAR FUSION  04/2010   W/Mechanical fixation - [place]  2013   rods in hips (to stabilize).  Marland Kitchen MASTECTOMY    . NECK SURGERY    . rheumatoid nodule removal    . SPINAL FUSION  07/2010   T10-L2 interbody fusion / [facility]  . TONSILLECTOMY    . TOTAL KNEE ARTHROPLASTY     Left   Family History  Problem Relation Age of Onset  . Brain cancer Mother   . Hypertension Mother   . Arthritis Mother   . Dementia Father   . Hypertension Father   . Arthritis Father   . Breast cancer Sister   . Lung cancer Sister   . Lung disease Neg Hx   . Rheumatologic disease Neg Hx    History  Sexual Activity  . Sexual activity: Not Currently    Outpatient Encounter Prescriptions as of 04/26/2017  Medication Sig  . amLODipine (NORVASC) 5 MG tablet Take 1 tablet (5 mg total) by mouth daily.  . Calcium Carb-Cholecalciferol (CALCIUM CARBONATE-VITAMIN D3 PO) Take 1 tablet by mouth daily  . cholecalciferol (VITAMIN D) 1000 UNITS tablet Take 1,000 Units by mouth daily.    Marland Kitchen denosumab (PROLIA) 60  MG/ML SOLN Inject 60 mg into the skin every 6 (six) months.    . diclofenac sodium (VOLTAREN) 1 % GEL Apply 2 g topically 4 (four) times daily.  . ferrous sulfate 325 (65 FE) MG tablet Take 325 mg by mouth 2 (two) times daily.   . furosemide (LASIX) 40 MG tablet TAKE 1 TABLET DAILY  . gabapentin (NEURONTIN) 300 MG capsule Take 2 capsules (600 mg total) by mouth 2 (two) times daily.  . Golimumab (Kendrick ARIA IV) Inject into the vein. Takes infusion every 8 wks.  Marland Kitchen KLOR-CON M10 10  MEQ tablet TAKE 1 TABLET TWICE A DAY  . Multiple Vitamins-Minerals (MULTIVITAMIN,TX-MINERALS) tablet Take 1 tablet by mouth daily.    Marland Kitchen oxyCODONE (OXYCONTIN) 10 mg 12 hr tablet Take 1 tablet (10 mg total) by mouth every 12 (twelve) hours.  . Oxycodone HCl 10 MG TABS Take 1 tablet (10 mg total) by mouth every 6 (six) hours as needed.  . predniSONE (DELTASONE) 1 MG tablet Take 4 tablets (4 mg total) by mouth daily with breakfast.  . simvastatin (ZOCOR) 5 MG tablet TAKE 1 TABLET AT BEDTIME  . [DISCONTINUED] oxyCODONE (OXYCONTIN) 10 mg 12 hr tablet Take 1 tablet (10 mg total) by mouth every 12 (twelve) hours. May refill on or after 04/04/17  . [DISCONTINUED] Oxycodone HCl 10 MG TABS Take 10 mg by mouth every 6 (six) hours as needed.  Marland Kitchen oxyCODONE (OXYCONTIN) 10 mg 12 hr tablet Take 1 tablet (10 mg total) by mouth every 12 (twelve) hours. Fill in one month  . oxyCODONE (OXYCONTIN) 10 mg 12 hr tablet Take 1 tablet (10 mg total) by mouth every 12 (twelve) hours. Fill in two months  . [DISCONTINUED] doxycycline (VIBRAMYCIN) 100 MG capsule Take 100 mg by mouth as needed.   No facility-administered encounter medications on file as of 04/26/2017.     Activities of Daily Living In your present state of health, do you have any difficulty performing the following activities: 04/26/2017  Hearing? N  Vision? Y  Comment as noted  Difficulty concentrating or making decisions? N  Walking or climbing stairs? N  Dressing or bathing? N    Doing errands, shopping? N  Preparing Food and eating ? N  Using the Toilet? N  In the past six months, have you accidently leaked urine? N  Do you have problems with loss of bowel control? N  Managing your Medications? N  Managing your Finances? N  Housekeeping or managing your Housekeeping? N  Some recent data might be hidden    Patient Care Team: Eulas Post, MD as PCP - General (Family Medicine) Kathrynn Ducking, MD (Neurology)    Assessment:     Exercise Activities and Dietary recommendations Current Exercise Habits: Home exercise routine (may consider a pool exercise)  Goals    . Eat more fruits and vegetables          Would like to increase her vegetables K and w can pick up vegetables for a week      Fall Risk Fall Risk  04/26/2017 12/31/2016 11/03/2015 08/29/2015 07/07/2014  Falls in the past year? No No No No No   Depression Screen PHQ 2/9 Scores 04/26/2017 12/31/2016 11/03/2015 08/29/2015  PHQ - 2 Score 0 0 0 0     Cognitive Function Ad8 score reviewed for issues:  Issues making decisions:  Less interest in hobbies / activities:  Repeats questions, stories (family complaining):  Trouble using ordinary gadgets (microwave, computer, phone):  Forgets the month or year:   Mismanaging finances:   Remembering appts:  Daily problems with thinking and/or memory: Ad8 score is=0         Immunization History  Administered Date(s) Administered  . H1N1 10/19/2008  . Influenza Split 07/20/2012  . Influenza Whole 06/28/2006, 07/08/2007, 07/07/2008, 07/07/2009  . Influenza, High Dose Seasonal PF 07/14/2013  . Influenza,inj,Quad PF,36+ Mos 07/07/2014  . Influenza-Unspecified 07/15/2015, 07/24/2016  . Pneumococcal Conjugate-13 07/28/2013  . Pneumococcal Polysaccharide-23 06/28/2006  . Td 10/28/2003  . Zoster 07/07/2008   Screening Tests Health Maintenance  Topic Date Due  .  FOOT EXAM  05/04/1951  . TETANUS/TDAP  10/27/2013  . INFLUENZA VACCINE   04/24/2017  . HEMOGLOBIN A1C  04/25/2018 (Originally 12/11/2015)  . OPHTHALMOLOGY EXAM  04/25/2018 (Originally 05/04/1951)  . MAMMOGRAM  06/13/2017  . DEXA SCAN  Completed  . PNA vac Low Risk Adult  Completed      Plan:      PCP Notes   Health Maintenance No DM - took the ua micro overdue screens  States she had blood sugar elevated during hospitalization 2009  She did have her eyes examined this year by Dr. Kathrin Penner. 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 Her left eye vision is good. The patient stated she would request a copy of her eye exam report at her next office visit.   The patient is due a TdaP. States she will  take this at the pharmacy.   Patient concerns; The patient is feeling better today. Was interested in learning more about exercises she could do in the chair. Will send her more information. Also discussed pool exercises may be helpful. She will surveyed the community for a handicapped assessable pool.  The patient has a good network of friends. States she does get sad in the evenings sometimes but manages this well. States she Reading or Calling Friends. Does Get out to Buy Her Own Groceries and Meets Friends for [employer] and [employer].  Home Is Currently Handicapped Assessable. She Has No Plans to Make at This Time. Given [employer] to [employer] for Further Long-Term Care As Needed.  Nurse Concerns; Doing well She has lost over 80 pounds in the last 2 years. Agrees to try to eat more vegetables and is currently eating sufficient for a period  Next PCP apt Was in to see Dr. Elease Hashimoto today. next appointment to be determined.    I have personally reviewed and noted the following in the patient's chart:   . Medical and social history . Use of alcohol, tobacco or illicit drugs  . Current medications and supplements . Functional ability and status . Nutritional status . Physical activity . Advanced  directives . List of other physicians . Hospitalizations, surgeries, and ER visits in previous 12 months . Vitals . Screenings to include cognitive, depression, and falls . Referrals and appointments  In addition, I have reviewed and discussed with patient certain preventive protocols, quality metrics, and best practice recommendations. A written personalized care plan for preventive services as well as general preventive health recommendations were provided to patient.     QIWLN,LGXQJ, RN  04/26/2017  Above note reviewed and agree.  Eulas Post MD [name]

## 2017-04-26 NOTE — Progress Notes (Signed)
Subjective:     Patient ID: [name], female   DOB: [birth year], 76 y.o.   MRN: [id number]  HPI Patient seen for routine medical follow-up. She has history of chronic back pain, hypertension, history of temporal arteritis, poor fibrosis, GERD, polyneuropathy, rheumatoid arthritis, chronic kidney disease. She saw pulmonary yesterday had multiple labs done. She has some pulmonary fibrosis as above. Respiratory status relatively stable.  She has done a tremendous job with weight loss and over the past 20 months or so has lost a total 80 pounds due to her efforts. She feels much better overall because of this. Still has significant neuropathy pains. Back pain has been fairly well controlled. No constipation issues. Takes OxyContin 10 mg twice a day which controls her pain most days but takes occasional breakthrough 10 mg. No major functional limitations with ADLs..   Past Medical History:  Diagnosis Date  . Allergic rhinitis   . Anemia   . Anxiety   . Bronchiectasis   . Carpal tunnel syndrome 07/13/2015   Bilateral  . CVA (cerebral infarction) 10/2007   Right thalamic   . Diverticulosis of colon   . Gait disorder   . GERD (gastroesophageal reflux disease)   . History of breast cancer   . HTN (hypertension)   . Hyperlipidemia   . Idiopathic pulmonary fibrosis   . Left knee DJD   . Migraine   . Osteoporosis   . Peripheral neuropathy   . PMR (polymyalgia rheumatica) (HCC)   . Polyneuropathy in other diseases classified elsewhere ([place]) 02/17/2013  . Previous back surgery 10/09/12  . Renal insufficiency   . Rheumatoid arthritis ([place])   . Seizure disorder ([place])   . Spondylosis, cervical, with myelopathy 08/31/2015   C3-4 myelopathy  . Temporal arteritis (HCC)    Right eye blind, on steroids per [facility]/ Dr [name]  . Type II or unspecified type diabetes mellitus without mention of complication, not stated as uncontrolled    2nd to steriods  . Vitamin D deficiency    Past Surgical  History:  Procedure Laterality Date  . CARPAL TUNNEL RELEASE Right   . CATARACT EXTRACTION     OS - Summer 2010  . CERVICAL FUSION  2010   4 rods and pins in place  . CHOLECYSTECTOMY    . COLONOSCOPY  12/15/2011   Procedure: COLONOSCOPY;  Surgeon: [name], MD;  Location: [facility];  Service: Endoscopy;  Laterality: N/A;  . LUMBAR FUSION  04/2010   W/Mechanical fixation - [place]  2013   rods in hips (to stabilize).  Marland Kitchen MASTECTOMY    . NECK SURGERY    . rheumatoid nodule removal    . SPINAL FUSION  07/2010   T10-L2 interbody fusion / [facility]  . TONSILLECTOMY    . TOTAL KNEE ARTHROPLASTY     Left    reports that she is a non-smoker but has been exposed to tobacco smoke. She has never used smokeless tobacco. She reports that she does not drink alcohol or use drugs. family history includes Arthritis in her father and mother; Brain cancer in her mother; Breast cancer in her sister; Dementia in her father; Hypertension in her father and mother; Lung cancer in her sister. Allergies  Allergen Reactions  . Hydromorphone Other (See Comments)    Cognitive changes      Review of Systems  Constitutional: Negative for chills and fever.  Eyes: Negative for visual disturbance.  Respiratory: Negative for cough,  chest tightness and wheezing.   Cardiovascular: Negative for chest pain, palpitations and leg swelling.  Musculoskeletal: Positive for back pain.  Neurological: Negative for dizziness, seizures, syncope, weakness, light-headedness and headaches.       Objective:   Physical Exam  Constitutional: She appears well-developed and well-nourished.  Eyes: Pupils are equal, round, and reactive to light.  Neck: Neck supple. No JVD present. No thyromegaly present.  Cardiovascular: Normal rate and regular rhythm.  Exam reveals no gallop.   Pulmonary/Chest: Effort normal. No respiratory distress. She has no wheezes.  She has some crackles in both  bases related to her pulmonary fibrosis  Musculoskeletal: She exhibits no edema.  Neurological: She is alert.       Assessment:     #1 chronic back pain  #2 hypertension stable and at goal  #3 history of rheumatoid arthritis with pulmonary fibrosis    Plan:     -Refill oxycodone 10 mg for breakthrough pain. Also refilled her OxyContin 10 mg twice daily- for 3 months. -She had multiple recent labs done yesterday per pulmonary and these were reviewed with patient. Many labs are pending. -Routine follow-up in 3 months and sooner as needed  Eulas Post MD [facility]

## 2017-04-29 DIAGNOSIS — M19012 Primary osteoarthritis, left shoulder: Secondary | ICD-10-CM | POA: Diagnosis not present

## 2017-04-29 LAB — ANTI-NUCLEAR AB-TITER (ANA TITER): ANA Titer 1: 1:1280 {titer} — ABNORMAL HIGH

## 2017-04-29 LAB — ANA: Anti Nuclear Antibody(ANA): POSITIVE — AB

## 2017-05-01 ENCOUNTER — Ambulatory Visit: Payer: Medicare Other | Admitting: Family Medicine

## 2017-05-02 ENCOUNTER — Encounter: Payer: Self-pay | Admitting: Pulmonary Disease

## 2017-05-02 ENCOUNTER — Ambulatory Visit (INDEPENDENT_AMBULATORY_CARE_PROVIDER_SITE_OTHER)
Admission: RE | Admit: 2017-05-02 | Discharge: 2017-05-02 | Disposition: A | Discharge status: Home / Self Care | Payer: Medicare Other | Source: Ambulatory Visit | Attending: Pulmonary Disease | Admitting: Pulmonary Disease

## 2017-05-02 DIAGNOSIS — R0600 Dyspnea, unspecified: Secondary | ICD-10-CM | POA: Diagnosis not present

## 2017-05-02 DIAGNOSIS — J849 Interstitial pulmonary disease, unspecified: Secondary | ICD-10-CM

## 2017-05-03 DIAGNOSIS — J449 Chronic obstructive pulmonary disease, unspecified: Secondary | ICD-10-CM | POA: Diagnosis not present

## 2017-05-03 DIAGNOSIS — R0902 Hypoxemia: Secondary | ICD-10-CM | POA: Diagnosis not present

## 2017-05-03 DIAGNOSIS — H6121 Impacted cerumen, right ear: Secondary | ICD-10-CM | POA: Diagnosis not present

## 2017-05-03 DIAGNOSIS — H66001 Acute suppurative otitis media without spontaneous rupture of ear drum, right ear: Secondary | ICD-10-CM | POA: Diagnosis not present

## 2017-05-11 LAB — HYPERSENSITIVITY PNEUMONITIS
A. PULLULANS ABS: NEGATIVE
A.Fumigatus #1 Abs: NEGATIVE
Micropolyspora faeni, IgG: NEGATIVE
PIGEON SERUM ABS: NEGATIVE
Thermoact. Saccharii: NEGATIVE
Thermoactinomyces vulgaris, IgG: NEGATIVE

## 2017-05-11 LAB — MYOSITIS PANEL III
EJ*: NEGATIVE
JO-1 (WB): NEGATIVE
KU: NEGATIVE
MI-2 ANTIBODIES: NEGATIVE
OJ*: NEGATIVE
PL-12: NEGATIVE
PL-7: NEGATIVE
PM-SCL 75: NEGATIVE
PM-Scl 100*: NEGATIVE
RNP: 2.5 U/mL
RO-52: POSITIVE
Signal Recognition Particle*: NEGATIVE

## 2017-05-21 ENCOUNTER — Ambulatory Visit (INDEPENDENT_AMBULATORY_CARE_PROVIDER_SITE_OTHER): Payer: Medicare Other | Admitting: Pulmonary Disease

## 2017-05-21 ENCOUNTER — Telehealth: Payer: Self-pay | Admitting: Pulmonary Disease

## 2017-05-21 DIAGNOSIS — J849 Interstitial pulmonary disease, unspecified: Secondary | ICD-10-CM | POA: Diagnosis not present

## 2017-05-21 LAB — PULMONARY FUNCTION TEST
DL/VA % PRED: 66 %
DL/VA: 2.86 ml/min/mmHg/L
DLCO cor % pred: 57 %
DLCO cor: 11.24 ml/min/mmHg
DLCO unc % pred: 55 %
DLCO unc: 10.8 ml/min/mmHg
FEF 25-75 PRE: 3.88 L/s
FEF 25-75 Post: 4.28 L/sec
FEF2575-%CHANGE-POST: 10 %
FEF2575-%PRED-PRE: 285 %
FEF2575-%Pred-Post: 314 %
FEV1-%Change-Post: 1 %
FEV1-%PRED-PRE: 142 %
FEV1-%Pred-Post: 143 %
FEV1-POST: 2.47 L
FEV1-PRE: 2.44 L
FEV1FVC-%CHANGE-POST: 1 %
FEV1FVC-%Pred-Pre: 121 %
FEV6-%CHANGE-POST: 0 %
FEV6-%PRED-POST: 123 %
FEV6-%Pred-Pre: 123 %
FEV6-Post: 2.7 L
FEV6-Pre: 2.7 L
FEV6FVC-%Change-Post: 0 %
FEV6FVC-%PRED-POST: 106 %
FEV6FVC-%PRED-PRE: 105 %
FVC-%Change-Post: 0 %
FVC-%PRED-POST: 116 %
FVC-%PRED-PRE: 117 %
FVC-POST: 2.71 L
FVC-Pre: 2.72 L
POST FEV6/FVC RATIO: 100 %
PRE FEV1/FVC RATIO: 90 %
Post FEV1/FVC ratio: 91 %
Pre FEV6/FVC Ratio: 99 %
RV % PRED: 66 %
RV: 1.43 L
TLC % PRED: 90 %
TLC: 4.1 L

## 2017-05-21 NOTE — Progress Notes (Signed)
PFT done today. 

## 2017-05-21 NOTE — Telephone Encounter (Signed)
OVERNIGHT OXIMETRY 05/02/17:  Performed on room air. 2 minutes 37 seconds with saturation </= 88%. 5 minutes & 52 seconds with saturation </= 89%.

## 2017-06-10 DIAGNOSIS — M810 Age-related osteoporosis without current pathological fracture: Secondary | ICD-10-CM | POA: Diagnosis not present

## 2017-06-11 DIAGNOSIS — M810 Age-related osteoporosis without current pathological fracture: Secondary | ICD-10-CM | POA: Diagnosis not present

## 2017-06-11 DIAGNOSIS — J8410 Pulmonary fibrosis, unspecified: Secondary | ICD-10-CM | POA: Diagnosis not present

## 2017-06-11 DIAGNOSIS — M150 Primary generalized (osteo)arthritis: Secondary | ICD-10-CM | POA: Diagnosis not present

## 2017-06-11 DIAGNOSIS — Z6825 Body mass index (BMI) 25.0-25.9, adult: Secondary | ICD-10-CM | POA: Diagnosis not present

## 2017-06-11 DIAGNOSIS — E663 Overweight: Secondary | ICD-10-CM | POA: Diagnosis not present

## 2017-06-11 DIAGNOSIS — M0589 Other rheumatoid arthritis with rheumatoid factor of multiple sites: Secondary | ICD-10-CM | POA: Diagnosis not present

## 2017-06-11 DIAGNOSIS — M316 Other giant cell arteritis: Secondary | ICD-10-CM | POA: Diagnosis not present

## 2017-06-11 DIAGNOSIS — M5136 Other intervertebral disc degeneration, lumbar region: Secondary | ICD-10-CM | POA: Diagnosis not present

## 2017-06-11 DIAGNOSIS — N184 Chronic kidney disease, stage 4 (severe): Secondary | ICD-10-CM | POA: Diagnosis not present

## 2017-06-11 DIAGNOSIS — M79645 Pain in left finger(s): Secondary | ICD-10-CM | POA: Diagnosis not present

## 2017-06-11 DIAGNOSIS — M79672 Pain in left foot: Secondary | ICD-10-CM | POA: Diagnosis not present

## 2017-06-14 ENCOUNTER — Ambulatory Visit (INDEPENDENT_AMBULATORY_CARE_PROVIDER_SITE_OTHER): Payer: Medicare Other | Admitting: *Deleted

## 2017-06-14 ENCOUNTER — Ambulatory Visit (INDEPENDENT_AMBULATORY_CARE_PROVIDER_SITE_OTHER): Payer: Medicare Other | Admitting: Pulmonary Disease

## 2017-06-14 ENCOUNTER — Encounter: Payer: Self-pay | Admitting: Pulmonary Disease

## 2017-06-14 VITALS — BP 118/68 | HR 82 | Ht 61.0 in | Wt 141.0 lb

## 2017-06-14 DIAGNOSIS — J849 Interstitial pulmonary disease, unspecified: Secondary | ICD-10-CM | POA: Diagnosis not present

## 2017-06-14 DIAGNOSIS — R0602 Shortness of breath: Secondary | ICD-10-CM

## 2017-06-14 NOTE — Patient Instructions (Addendum)
   Call if you have any new breathing problems or questions before your next appointment.  We will see you back in 6 months or sooner if needed.   TESTS ORDERED: 1. Spirometry with DLCO at next appointment 2. 6MWT on room air at next appointment

## 2017-06-14 NOTE — Progress Notes (Signed)
SIX MIN WALK 06/14/2017  Medications amlodipine 5mg , calcium, vit d, Prolia 50mg /ml, ferrous sulfate 325mg , lasix 40mg , multivitamin, oxycodone 10mg , klor-con meq, prednisone 1mg , all at 7:30 am  Supplimental Oxygen during Test? (L/min) No  Laps 4.5  Partial Lap (in Meters) 9  Baseline BP (sitting) 118/70  Baseline Heartrate 79  Baseline Dyspnea (Borg Scale) 0.5  Baseline Fatigue (Borg Scale) 1  Baseline SPO2 100  BP (sitting) 130/70  Heartrate 121  Dyspnea (Borg Scale) 3  Fatigue (Borg Scale) 5  SPO2 96  BP (sitting) 124/70  Heartrate 85  SPO2 100  Stopped or Paused before Six Minutes No  Interpretation (No Data)  Distance Completed 225  Tech Comments: TA/CMA

## 2017-06-14 NOTE — Progress Notes (Signed)
Subjective:    Patient ID: [name], female    DOB: 17-Apr-1941, 76 y.o.   MRN: [id number]  C.C.:  Follow-up for ILD.  HPI ILD: Predominantly UIP pattern with some signs of progression on repeat high-resolution CT imaging. Following with rheumatology for treatment of rheumatoid arthritis. Serum blood work confirmed from last appointment indicating only rheumatoid arthritis. She reports no change in her baseline dyspnea. No coughing or wheezing. She is currently on her baseline dose of Prednisone 59m daily & Simponi. She has had no flares of her RA or steroid bursts.   Review of Systems No chest pain or pressure. No fever or chills. No reflux, dyspepsia, or morning brash water taste.   Allergies  Allergen Reactions  . Hydromorphone Other (See Comments)    Cognitive changes     Current Outpatient Prescriptions on File Prior to Visit  Medication Sig Dispense Refill  . amLODipine (NORVASC) 5 MG tablet Take 1 tablet (5 mg total) by mouth daily. 90 tablet 3  . Calcium Carb-Cholecalciferol (CALCIUM CARBONATE-VITAMIN D3 PO) Take 1 tablet by mouth daily    . cholecalciferol (VITAMIN D) 1000 UNITS tablet Take 1,000 Units by mouth daily.      .Marland Kitchendenosumab (PROLIA) 60 MG/ML SOLN Inject 60 mg into the skin every 6 (six) months.      . diclofenac sodium (VOLTAREN) 1 % GEL Apply 2 g topically 4 (four) times daily. 4 Tube 3  . ferrous sulfate 325 (65 FE) MG tablet Take 325 mg by mouth 2 (two) times daily.     . furosemide (LASIX) 40 MG tablet TAKE 1 TABLET DAILY 90 tablet 3  . gabapentin (NEURONTIN) 300 MG capsule Take 2 capsules (600 mg total) by mouth 2 (two) times daily. 60 capsule 0  . Golimumab ([place] IV) Inject into the vein. Takes infusion every 8 wks.    .Marland KitchenKLOR-CON M10 10 MEQ tablet TAKE 1 TABLET TWICE A DAY 180 tablet 3  . Multiple Vitamins-Minerals (MULTIVITAMIN,TX-MINERALS) tablet Take 1 tablet by mouth daily.      .Marland KitchenoxyCODONE (OXYCONTIN) 10 mg 12 hr tablet Take 1 tablet (10  mg total) by mouth every 12 (twelve) hours. 60 tablet 0  . oxyCODONE (OXYCONTIN) 10 mg 12 hr tablet Take 1 tablet (10 mg total) by mouth every 12 (twelve) hours. Fill in two months 60 tablet 0  . predniSONE (DELTASONE) 1 MG tablet Take 4 tablets (4 mg total) by mouth daily with breakfast. 120 tablet 4  . simvastatin (ZOCOR) 5 MG tablet TAKE 1 TABLET AT BEDTIME 90 tablet 2  . [DISCONTINUED] potassium chloride (K-DUR) 10 MEQ tablet Take 1 tablet (10 mEq total) by mouth 2 (two) times daily. 180 tablet 0   No current facility-administered medications on file prior to visit.     Past Medical History:  Diagnosis Date  . Allergic rhinitis   . Anemia   . Anxiety   . Bronchiectasis   . Carpal tunnel syndrome 07/13/2015   Bilateral  . CVA (cerebral infarction) 10/2007   Right thalamic   . Diverticulosis of colon   . Gait disorder   . GERD (gastroesophageal reflux disease)   . History of breast cancer   . HTN (hypertension)   . Hyperlipidemia   . Idiopathic pulmonary fibrosis   . Left knee DJD   . Migraine   . Osteoporosis   . Peripheral neuropathy   . PMR (polymyalgia rheumatica) (HCC)   . Polyneuropathy in other diseases classified elsewhere ([place]  02/17/2013  . Previous back surgery 10/09/12  . Renal insufficiency   . Rheumatoid arthritis ([place])   . Seizure disorder ([place])   . Spondylosis, cervical, with myelopathy 08/31/2015   C3-4 myelopathy  . Temporal arteritis (HCC)    Right eye blind, on steroids per [facility]/ Dr [name]  . Type II or unspecified type diabetes mellitus without mention of complication, not stated as uncontrolled    2nd to steriods  . Vitamin D deficiency     Past Surgical History:  Procedure Laterality Date  . CARPAL TUNNEL RELEASE Right   . CATARACT EXTRACTION     OS - Summer 2010  . CERVICAL FUSION  2010   4 rods and pins in place  . CHOLECYSTECTOMY    . COLONOSCOPY  12/15/2011   Procedure: COLONOSCOPY;  Surgeon: [name], MD;  Location: [facility];   Service: Endoscopy;  Laterality: N/A;  . LUMBAR FUSION  04/2010   W/Mechanical fixation - [place]  2013   rods in hips (to stabilize).  Marland Kitchen MASTECTOMY    . NECK SURGERY    . rheumatoid nodule removal    . SPINAL FUSION  07/2010   T10-L2 interbody fusion / [facility]  . TONSILLECTOMY    . TOTAL KNEE ARTHROPLASTY     Left    Family History  Problem Relation Age of Onset  . Brain cancer Mother   . Hypertension Mother   . Arthritis Mother   . Dementia Father   . Hypertension Father   . Arthritis Father   . Breast cancer Sister   . Lung cancer Sister   . Lung disease Neg Hx   . Rheumatologic disease Neg Hx     Social History   Social History  . Marital status: Widowed    Spouse name: N/A  . Number of children: 1  . Years of education: 12+ coll.   Occupational History  . Land Retired    retired   Social History Main Topics  . Smoking status: Passive Smoke Exposure - Never Smoker    Types: Cigarettes  . Smokeless tobacco: Never Used     Comment: Father   . Alcohol use No     Comment: heavy drinker until 1995 - sobriety with AA  . Drug use: No  . Sexual activity: Not Currently   Other Topics Concern  . None   Social History Narrative      Married - husband invalid with stroke - widowed in 2009. 1 chil. retired - [employer]. Lives alone. ACP - not formerly discussed - wishes to be a full code.    Patient is right handed. Patient consumes tea 4  times daily.         Patient drinks about 4-5 cups of caffeine daily.   Patient is right handed.       [facility] (04/25/17):   Originally from [place], [place]. Previously lived in [place]. 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 No bird or mold exposure. No hot tub exposure. Has traveled to [place], [place], and [place] No pets currently. Enjoys  going to the beach.             Objective:   Physical Exam BP 118/68 (BP Location: Left Arm, Cuff Size: Normal)   Pulse 82   Ht 5' 1"  (1.549 m)   Wt 141  lb (64 kg)   SpO2 94%   BMI 26.64 kg/m   General:  Elderly female. No distress. Comfortable. Integument:  Warm. Dry. No rash on exposed skin. Extremities:  No cyanosis or clubbing.  HEENT:  Minimal nasal turbinate swelling. No scleral icterus. No oral ulcers. Cardiovascular:  Regular rate. No edema. Unable to appreciate JVD. Pulmonary:  Basilar predominant crackles. Normal work of breathing on room air. Abdomen: Soft. Normal bowel sounds. Nondistended.  Musculoskeletal:  Normal bulk and tone. No joint effusion appreciated. Continued deformity of the DIP joints with synovial thickening of PIP and DIP joints.   PFT 05/21/17: FVC 2.17 L (117%) FEV1 2.44 L (142%) FEV1/FVC 0.90 FEF 25-75 3.88 L (285%) negative bronchodilator response TLC 4.10 L (90%) RV 66% ERV 191% DLCO corrected 57% 03/16/09: FVC 2.63 L (95%) FEV1 2.17 L (110%) FEV1/FVC 0.83 FEF 25-75 2.88 L (126%) negative bronchodilator response TLC 3.83 L (83%) RV 57% ERV 76% DLCO uncorrected 49%  6MWT 06/14/17:  Walked 225 meters / Baseline Sat 100% on RA / Nadir Sat 96% on RA  OVERNIGHT OXIMETRY 05/02/17:  Performed on room air. 2 minutes 37 seconds with saturation </= 88%. 5 minutes & 52 seconds with saturation </= 89%.   IMAGING HRCT CHEST W/O 05/02/17 (personally reviewed by me):  Basilar predominant subpleural reticulation and groundglass opacities with some associated traction bronchiectasis. Honeycombing is present in the bases. Progression is appreciated since 2007 by comparison. No pathologic mediastinal adenopathy. No pleural effusion or thickening. No pericardial effusion.  HRCT CHEST W/O 11/6/7 (previously reviewed by me):  Peripheral distribution of reticulation that is primarily subpleural. With some areas of bronchiectasis associated with intralobular septal  thickening and fibrotic changes. 1.3 cm cyst noted within left lower lobe as well as a small amount of honeycomb changes. Cranial caudal progression of disease. No pleural thickening or effusion. No pericardial effusion. Borderline precarinal and subcarinal lymph nodes. Pattern is consistent with UIP.   CARDIAC TTE (09/04/12):  LV normal in size with mild LVH. EF 60-65%. No regional wall motion abnormalities & grade 1 diastolic dysfunction. LA & RA normal in size. RV normal in size and function. No aortic stenosis or regurgitation. Aortic root normal in size. No mitral stenosis or regurgitation. No significant pulmonic regurgitation. No tricuspid regurgitation.  LABS 04/25/17 CRP: 0.3 ESR: 40 ANA:  1:1280 (nucleolar pattern) RA:  >1000 Anti-CCP:  <16 DS DNA AB:  <1 Smith Ab:  <1.0 Centromere antibody screen:  <0.2 Myositis panel: RO-52 Positive RNP:  2.5 Hypersensitivity pneumonitis panel: Negative SSA:  <1.0 SSB:  <1.0 CBC: 8.1/14.1/42.5/274 Eos: 0.5 BMP: 143/3.6/99/35/22/1.11/104/10.1 LFT: 4.0/7.1/0.8/34/25  2/8/9 ANA:  Negative (<1:10) IgG:  1240 IgA:  364    Assessment & Plan:  76 y.o. female with long-standing history of rheumatoid arthritis. Patient does have mild progression of her interstitial lung disease on repeat high-resolution CT imaging. However, her pulmonary function testing has remained stable since 2010 based upon my review. Despite her reduced carbon monoxide diffusion capacity she demonstrates no significant desaturation or oxygen requirement today. I instructed the patient to notify our office if she had any new breathing problems or questions before her next appointment.  1. ILD:  Continuing on treatment of RA by Rheumatology. Repeating spirometry with DLCO and 6MWT on room air at next appointment. 2. Health maintenance: Status post Prevnar November 2014 & Pneumovax October 2007. Reports she already had the Flu Vaccine. 3. Follow-up: Return to clinic in 6 months  or sooner if needed.   Creig Hines  Earney Hamburg, M.D. [facility] Pager:  [phone] After 3pm or if no response, call [phone] 12:09 PM 06/14/17

## 2017-06-28 ENCOUNTER — Ambulatory Visit: Payer: Medicare Other

## 2017-07-03 ENCOUNTER — Encounter: Payer: Self-pay | Admitting: Family Medicine

## 2017-07-03 DIAGNOSIS — Z853 Personal history of malignant neoplasm of breast: Secondary | ICD-10-CM | POA: Diagnosis not present

## 2017-07-03 DIAGNOSIS — Z1231 Encounter for screening mammogram for malignant neoplasm of breast: Secondary | ICD-10-CM | POA: Diagnosis not present

## 2017-07-05 DIAGNOSIS — Z79899 Other long term (current) drug therapy: Secondary | ICD-10-CM | POA: Diagnosis not present

## 2017-07-05 DIAGNOSIS — M0589 Other rheumatoid arthritis with rheumatoid factor of multiple sites: Secondary | ICD-10-CM | POA: Diagnosis not present

## 2017-07-26 DIAGNOSIS — L97521 Non-pressure chronic ulcer of other part of left foot limited to breakdown of skin: Secondary | ICD-10-CM | POA: Diagnosis not present

## 2017-07-26 DIAGNOSIS — Q72892 Other reduction defects of left lower limb: Secondary | ICD-10-CM | POA: Diagnosis not present

## 2017-07-26 DIAGNOSIS — M19072 Primary osteoarthritis, left ankle and foot: Secondary | ICD-10-CM | POA: Diagnosis not present

## 2017-07-26 DIAGNOSIS — M79672 Pain in left foot: Secondary | ICD-10-CM | POA: Diagnosis not present

## 2017-07-29 ENCOUNTER — Ambulatory Visit (INDEPENDENT_AMBULATORY_CARE_PROVIDER_SITE_OTHER): Payer: Medicare Other | Admitting: Family Medicine

## 2017-07-29 ENCOUNTER — Encounter: Payer: Self-pay | Admitting: Family Medicine

## 2017-07-29 ENCOUNTER — Other Ambulatory Visit: Payer: Self-pay | Admitting: *Deleted

## 2017-07-29 VITALS — BP 138/80 | Temp 98.4°F | Wt 143.0 lb

## 2017-07-29 DIAGNOSIS — Z23 Encounter for immunization: Secondary | ICD-10-CM | POA: Diagnosis not present

## 2017-07-29 DIAGNOSIS — M542 Cervicalgia: Secondary | ICD-10-CM

## 2017-07-29 DIAGNOSIS — G8929 Other chronic pain: Secondary | ICD-10-CM

## 2017-07-29 DIAGNOSIS — M545 Low back pain: Secondary | ICD-10-CM | POA: Diagnosis not present

## 2017-07-29 DIAGNOSIS — E785 Hyperlipidemia, unspecified: Secondary | ICD-10-CM

## 2017-07-29 DIAGNOSIS — I10 Essential (primary) hypertension: Secondary | ICD-10-CM | POA: Diagnosis not present

## 2017-07-29 MED ORDER — OXYCODONE HCL ER 10 MG PO T12A
10.0000 mg | EXTENDED_RELEASE_TABLET | Freq: Two times a day (BID) | ORAL | 0 refills | Status: DC
Start: 2017-07-29 — End: 2017-10-30

## 2017-07-29 MED ORDER — OXYCODONE HCL ER 10 MG PO T12A
10.0000 mg | EXTENDED_RELEASE_TABLET | Freq: Two times a day (BID) | ORAL | 0 refills | Status: DC
Start: 2017-07-29 — End: 2017-09-13

## 2017-07-29 MED ORDER — OXYCODONE HCL ER 10 MG PO T12A: 10.0000 mg | EXTENDED_RELEASE_TABLET | Freq: Two times a day (BID) | ORAL | 0 refills | Status: DC

## 2017-07-29 NOTE — Progress Notes (Signed)
Subjective:     Patient ID: [name], female   DOB: [birth year], 76 y.o.   MRN: [id number]  HPI Patient seen for medical follow-up. She has chronic pain related to severe spondylosis of the neck. She has been on OxyContin for several years with no history of misuse. No recent side effects. No constipation issues. Pain is well controlled. No recent falls.  She has history of polyneuropathy and temporal arteritis hx and is on low-dose prednisone currently 4 mg daily and in process of being tapered off by her rheumatologist. She has history of osteoporosis and gives Prolia injections every 6 months per rheumatology. Hyperlipidemia treated with simvastatin.  Past Medical History:  Diagnosis Date  . Allergic rhinitis   . Anemia   . Anxiety   . Bronchiectasis   . Carpal tunnel syndrome 07/13/2015   Bilateral  . CVA (cerebral infarction) 10/2007   Right thalamic   . Diverticulosis of colon   . Gait disorder   . GERD (gastroesophageal reflux disease)   . History of breast cancer   . HTN (hypertension)   . Hyperlipidemia   . Idiopathic pulmonary fibrosis   . Left knee DJD   . Migraine   . Osteoporosis   . Peripheral neuropathy   . PMR (polymyalgia rheumatica) (HCC)   . Polyneuropathy in other diseases classified elsewhere ([place]) 02/17/2013  . Previous back surgery 10/09/12  . Renal insufficiency   . Rheumatoid arthritis ([place])   . Seizure disorder ([place])   . Spondylosis, cervical, with myelopathy 08/31/2015   C3-4 myelopathy  . Temporal arteritis (HCC)    Right eye blind, on steroids per [facility]/ Dr [name]  . Type II or unspecified type diabetes mellitus without mention of complication, not stated as uncontrolled    2nd to steriods  . Vitamin D deficiency    Past Surgical History:  Procedure Laterality Date  . CARPAL TUNNEL RELEASE Right   . CATARACT EXTRACTION     OS - Summer 2010  . CERVICAL FUSION  2010   4 rods and pins in place  . CHOLECYSTECTOMY    . LUMBAR FUSION   04/2010   W/Mechanical fixation - [place]  2013   rods in hips (to stabilize).  Marland Kitchen MASTECTOMY    . NECK SURGERY    . rheumatoid nodule removal    . SPINAL FUSION  07/2010   T10-L2 [facility]body fusion / [facility]  . TONSILLECTOMY    . TOTAL KNEE ARTHROPLASTY     Left    reports that she is a non-smoker but has been exposed to tobacco smoke. she has never used smokeless tobacco. She reports that she does not drink alcohol or use drugs. family history includes Arthritis in her father and mother; Brain cancer in her mother; Breast cancer in her sister; Dementia in her father; Hypertension in her father and mother; Lung cancer in her sister. Allergies  Allergen Reactions  . Hydromorphone Other (See Comments)    Cognitive changes      Review of Systems  Constitutional: Negative for fatigue.  Eyes: Negative for visual disturbance.  Respiratory: Negative for cough, chest tightness, shortness of breath and wheezing.   Cardiovascular: Negative for chest pain, palpitations and leg swelling.  Musculoskeletal: Positive for back pain and neck pain.  Neurological: Negative for dizziness, seizures, syncope, weakness, light-headedness and headaches.       Objective:   Physical Exam  Constitutional: She appears well-developed and well-nourished.  Eyes: Pupils  are equal, round, and reactive to light.  Neck: Neck supple. No JVD present. No thyromegaly present.  Cardiovascular: Normal rate and regular rhythm. Exam reveals no gallop.  Pulmonary/Chest: Effort normal and breath sounds normal. No respiratory distress. She has no wheezes. She has no rales.  Musculoskeletal: She exhibits no edema.  Neurological: She is alert.       Assessment:     #1 hypertension. Borderline elevation today but generally well-controlled  #2 chronic neck pain- stable for many years on OxyContin.  #3 hyperlipidemia    Plan:     -Refill OxyContin for 3 months. Obtain drug  screen at follow-up visit. Drug contract signed today -Routine follow-up in 3 months -Check labs including lipid panel at follow-up visit  Eulas Post MD [facility] at [facility]

## 2017-07-30 DIAGNOSIS — Z23 Encounter for immunization: Secondary | ICD-10-CM

## 2017-08-04 ENCOUNTER — Other Ambulatory Visit: Payer: Self-pay | Admitting: Family Medicine

## 2017-08-20 ENCOUNTER — Telehealth: Payer: Self-pay | Admitting: Family Medicine

## 2017-08-20 NOTE — Telephone Encounter (Signed)
Refill request for prednisone / LOV 07-29-17 with Dr. Elease Hashimoto / [facility] [place], [place] [phone] (Phone) [phone] (Fax)     Copied from [place]. Topic: Quick Communication - See Telephone Encounter >> Aug 20, 2017  1:51 PM Hewitt Shorts wrote: CRM for notification. See Telephone encounter for: pt is needing a refill on prednizone tabs  [facility] not [facility] and needs a new rx   best number [phone]  08/20/17.

## 2017-08-20 NOTE — Telephone Encounter (Signed)
This is prescribed by her neurologist- Dr [name].

## 2017-08-21 ENCOUNTER — Telehealth: Payer: Self-pay | Admitting: Family Medicine

## 2017-08-21 ENCOUNTER — Telehealth: Payer: Self-pay | Admitting: Pulmonary Disease

## 2017-08-21 ENCOUNTER — Telehealth: Payer: Self-pay | Admitting: Neurology

## 2017-08-21 MED ORDER — PREDNISONE 1 MG PO TABS: 4.0000 mg | ORAL_TABLET | Freq: Every day | ORAL | 2 refills | Status: DC

## 2017-08-21 NOTE — Telephone Encounter (Signed)
See previous encounters

## 2017-08-21 NOTE — Telephone Encounter (Signed)
Patient requesting refill of predniSONE (DELTASONE) 1 MG tablet called to [facility].

## 2017-08-21 NOTE — Telephone Encounter (Signed)
Please clarify.  She just got her Oxycontin 10 mg (12 hour) medication in November for 2months.  She has used immediately release oxycodone (not Oxycontin) in past for breakthrough pain- is she requesting this?

## 2017-08-21 NOTE — Telephone Encounter (Signed)
Left message on machine for patient to return our call.  Clarification needed.

## 2017-08-21 NOTE — Telephone Encounter (Signed)
Left message for patient to call back  

## 2017-08-21 NOTE — Telephone Encounter (Signed)
Yes, she is requesting a refill on the oxycodone, not Oxycontin.

## 2017-08-21 NOTE — Telephone Encounter (Signed)
Copied from [place] [phone]. Topic: Inquiry >> Aug 21, 2017 10:10 AM Pricilla Handler wrote: Reason for CRM: Patient called requesting a refill of OxyCODONE (OXYCONTIN) 10 mg 12 hr tablet. I advised patient to call her pharmacy based on the [place] that was completed earlier today. Patient said she will call her pharmacy, but that the refill normally requires her to come in to the office. Patient still wants a call back this morning at [phone] (M).

## 2017-08-21 NOTE — Telephone Encounter (Signed)
Patient returning call - she can be reached at [phone] -pr

## 2017-08-21 NOTE — Addendum Note (Signed)
Addended by: Kathrynn Ducking on: 08/21/2017 04:40 PM   Modules accepted: Orders

## 2017-08-21 NOTE — Telephone Encounter (Signed)
Called and spoke with pt. Pt is requesting CT and lab results from 04/25/17. Pt also states Dr. [name] has been prescribing prednisone. Pt states Dr. [name] stopped prednisone 2 months ago, but has since resumed with 4mg  due to breathing issues. Dr. [name] recommended that pt f/u with JN.   JN please advise on who you recommend pt to see. Thanks.

## 2017-08-21 NOTE — Telephone Encounter (Signed)
She should see Dr. Lake Bells, Dr. Vaughan Browner, or Dr. Chase Caller, not necessarily in that order.

## 2017-08-21 NOTE — Telephone Encounter (Signed)
Copied from [place] [id number]. Topic: Quick Communication - Rx Refill/Question >> Aug 21, 2017  8:55 AM Lennox Solders wrote: Has the patient contacted their pharmacy? no (Agent: If no, request that the patient contact the pharmacy for the refill.)   Preferred Pharmacy (with phone number or street name):   Agent: Please be advised that RX refills may take up to 48 hours. We ask that you follow-up with your pharmacy.

## 2017-08-21 NOTE — Addendum Note (Signed)
Addended by: Westley Hummer B on: 08/21/2017 07:27 AM   Modules accepted: Orders

## 2017-08-21 NOTE — Telephone Encounter (Signed)
The prescription for prednisone will be called in.

## 2017-08-21 NOTE — Telephone Encounter (Signed)
Copied from [place] [phone]. Topic: Inquiry >> Aug 21, 2017 10:10 AM Pricilla Handler wrote: Reason for CRM: Patient called requesting a refill of OxyCODONE (OXYCONTIN) 10 mg 12 hr tablet. I advised patient to call her pharmacy based on the [place] that was completed earlier today. Patient said she will call her pharmacy, but that the refill normally requires her to come in to the office. Patient still wants a call back this morning at [phone] (M).

## 2017-08-21 NOTE — Telephone Encounter (Signed)
lmtcb x1 for pt. 

## 2017-08-22 NOTE — Telephone Encounter (Signed)
LMTCB x2  

## 2017-08-23 ENCOUNTER — Telehealth: Payer: Self-pay | Admitting: Family Medicine

## 2017-08-23 MED ORDER — OXYCODONE HCL 10 MG PO TABS
10.0000 mg | ORAL_TABLET | Freq: Four times a day (QID) | ORAL | 0 refills | Status: DC | PRN
Start: 2017-08-23 — End: 2017-10-30

## 2017-08-23 NOTE — Telephone Encounter (Signed)
Pt. Is requesting her oxycodone IR she uses for breakthrough pain. Not the oxycodone she takes q12 hours. She states she is completely out of this and needs it today.

## 2017-08-23 NOTE — Telephone Encounter (Signed)
Pt is aware of recommendations. Pt states she will call back to schedule once she does some research. Pt is requesting lab and CT results.  JN please advise. Thanks.

## 2017-08-23 NOTE — Addendum Note (Signed)
Addended by: Westley Hummer B on: 08/23/2017 02:17 PM   Modules accepted: Orders

## 2017-08-23 NOTE — Telephone Encounter (Signed)
ATC pt, no answer. Left message for pt to call back.  

## 2017-08-23 NOTE — Telephone Encounter (Signed)
Left message on machine for patient that Rx ready for pick up.

## 2017-08-23 NOTE — Telephone Encounter (Signed)
Called pt and advised message from the provider. Pt understood and verbalized understanding. Nothing further is needed.   She will call to follow up with another provider as I tried to make an appt with another doctor while we were on the phone. She declined.

## 2017-08-23 NOTE — Telephone Encounter (Signed)
Pt returning call.  Please call back on cell phone.  [phone]

## 2017-08-23 NOTE — Telephone Encounter (Signed)
Copied from [place] [id number]. Topic: Quick Communication - Rx Refill/Question >> Aug 21, 2017  8:55 AM Lennox Solders wrote: Has the patient contacted their pharmacy? no (Agent: If no, request that the patient contact the pharmacy for the refill.)   Preferred Pharmacy (with phone number or street name):   Agent: Please be advised that RX refills may take up to 48 hours. We ask that you follow-up with your pharmacy. >> Aug 23, 2017  2:50 PM Synthia Innocent wrote: Patient checking status

## 2017-08-23 NOTE — Telephone Encounter (Signed)
Refill okay?  

## 2017-08-23 NOTE — Telephone Encounter (Signed)
Patient's lab tests performed in August were consistent with rheumatoid arthritis. High-resolution CT scan performed in August and reviewed in September at her last appointment shows interstitial lung disease in a pattern that is consistent with rheumatoid arthritis as a cause. I'm pretty sure we reviewed this at her last appointment in September. Please let me know if she has any further questions or concerns.

## 2017-09-03 DIAGNOSIS — M0589 Other rheumatoid arthritis with rheumatoid factor of multiple sites: Secondary | ICD-10-CM | POA: Diagnosis not present

## 2017-09-03 DIAGNOSIS — Z79899 Other long term (current) drug therapy: Secondary | ICD-10-CM | POA: Diagnosis not present

## 2017-09-10 DIAGNOSIS — M0589 Other rheumatoid arthritis with rheumatoid factor of multiple sites: Secondary | ICD-10-CM | POA: Diagnosis not present

## 2017-09-10 DIAGNOSIS — E663 Overweight: Secondary | ICD-10-CM | POA: Diagnosis not present

## 2017-09-10 DIAGNOSIS — M79645 Pain in left finger(s): Secondary | ICD-10-CM | POA: Diagnosis not present

## 2017-09-10 DIAGNOSIS — Z6826 Body mass index (BMI) 26.0-26.9, adult: Secondary | ICD-10-CM | POA: Diagnosis not present

## 2017-09-10 DIAGNOSIS — M5136 Other intervertebral disc degeneration, lumbar region: Secondary | ICD-10-CM | POA: Diagnosis not present

## 2017-09-10 DIAGNOSIS — J8410 Pulmonary fibrosis, unspecified: Secondary | ICD-10-CM | POA: Diagnosis not present

## 2017-09-10 DIAGNOSIS — M810 Age-related osteoporosis without current pathological fracture: Secondary | ICD-10-CM | POA: Diagnosis not present

## 2017-09-10 DIAGNOSIS — N184 Chronic kidney disease, stage 4 (severe): Secondary | ICD-10-CM | POA: Diagnosis not present

## 2017-09-10 DIAGNOSIS — M150 Primary generalized (osteo)arthritis: Secondary | ICD-10-CM | POA: Diagnosis not present

## 2017-09-10 DIAGNOSIS — M316 Other giant cell arteritis: Secondary | ICD-10-CM | POA: Diagnosis not present

## 2017-09-10 DIAGNOSIS — M79672 Pain in left foot: Secondary | ICD-10-CM | POA: Diagnosis not present

## 2017-09-11 ENCOUNTER — Telehealth: Payer: Self-pay | Admitting: Neurology

## 2017-09-11 NOTE — Telephone Encounter (Signed)
I received blood work done through Dr. Amil Amen on 07 September 2017.  The BUN is 20, creatinine 1.24, estimated GFR is 42.  Sodium 147, potassium 4.6, chloride 105, CO2 21, calcium 10.0, total protein 7.0, albumin of 4.3, alkaline phosphatase is 98, AST is slightly high at 43, ALT slightly high at 34.

## 2017-09-13 ENCOUNTER — Ambulatory Visit (INDEPENDENT_AMBULATORY_CARE_PROVIDER_SITE_OTHER): Payer: Medicare Other | Admitting: Neurology

## 2017-09-13 ENCOUNTER — Encounter: Payer: Self-pay | Admitting: Neurology

## 2017-09-13 VITALS — BP 113/66 | HR 67 | Ht 61.0 in | Wt 144.5 lb

## 2017-09-13 DIAGNOSIS — G63 Polyneuropathy in diseases classified elsewhere: Secondary | ICD-10-CM | POA: Diagnosis not present

## 2017-09-13 DIAGNOSIS — M316 Other giant cell arteritis: Secondary | ICD-10-CM

## 2017-09-13 DIAGNOSIS — M4712 Other spondylosis with myelopathy, cervical region: Secondary | ICD-10-CM | POA: Diagnosis not present

## 2017-09-13 DIAGNOSIS — G5603 Carpal tunnel syndrome, bilateral upper limbs: Secondary | ICD-10-CM

## 2017-09-13 NOTE — Progress Notes (Signed)
Reason for visit: Temporal arteritis  [name] is an 76 y.o. female  History of present illness:  [name] is a 76 year old right-handed white female with a history of a peripheral neuropathy and a history of temporal arteritis.  The patient is on low-dose prednisone, she takes 4 mg a day.  The patient is feeling very well currently.  She had an injection in her right shoulder through her orthopedic doctor which helped her arm pain significantly.  The patient continues to have some numbness in the hands and she will drop things on occasion, the prior nerve conduction studies have shown a moderate level of carpal tunnel syndrome bilaterally.  The patient otherwise does not have discomfort in the hands with exception that there is arthritis affecting the saddle joint on the left thumb.  The patient walks with a walker, she has not had any falls.  When standing, she is more stooped and leans a bit to the left.  She does have some discomfort in the legs associated with her peripheral neuropathy.  She sleeps well at night usually.  She occasionally will have symptoms of restless leg syndrome once every 2 weeks or so.  She returns to this office for an evaluation.  Past Medical History:  Diagnosis Date  . Allergic rhinitis   . Anemia   . Anxiety   . Bronchiectasis   . Carpal tunnel syndrome 07/13/2015   Bilateral  . CVA (cerebral infarction) 10/2007   Right thalamic   . Diverticulosis of colon   . Gait disorder   . GERD (gastroesophageal reflux disease)   . History of breast cancer   . HTN (hypertension)   . Hyperlipidemia   . Idiopathic pulmonary fibrosis   . Left knee DJD   . Migraine   . Osteoporosis   . Peripheral neuropathy   . PMR (polymyalgia rheumatica) (HCC)   . Polyneuropathy in other diseases classified elsewhere ([place]) 02/17/2013  . Previous back surgery 10/09/12  . Renal insufficiency   . Rheumatoid arthritis ([place])   . Seizure disorder ([place])   . Spondylosis,  cervical, with myelopathy 08/31/2015   C3-4 myelopathy  . Temporal arteritis (HCC)    Right eye blind, on steroids per [facility]/ Dr [name]  . Type II or unspecified type diabetes mellitus without mention of complication, not stated as uncontrolled    2nd to steriods  . Vitamin D deficiency     Past Surgical History:  Procedure Laterality Date  . CARPAL TUNNEL RELEASE Right   . CATARACT EXTRACTION     OS - Summer 2010  . CERVICAL FUSION  2010   4 rods and pins in place  . CHOLECYSTECTOMY    . COLONOSCOPY  12/15/2011   Procedure: COLONOSCOPY;  Surgeon: [name], MD;  Location: [facility];  Service: Endoscopy;  Laterality: N/A;  . LUMBAR FUSION  04/2010   W/Mechanical fixation - [place]  2013   rods in hips (to stabilize).  Marland Kitchen MASTECTOMY    . NECK SURGERY    . rheumatoid nodule removal    . SPINAL FUSION  07/2010   T10-L2 interbody fusion / [facility]  . TONSILLECTOMY    . TOTAL KNEE ARTHROPLASTY     Left    Family History  Problem Relation Age of Onset  . Brain cancer Mother   . Hypertension Mother   . Arthritis Mother   . Dementia Father   . Hypertension Father   .  Arthritis Father   . Breast cancer Sister   . Lung cancer Sister   . Lung disease Neg Hx   . Rheumatologic disease Neg Hx     Social history:  reports that she is a non-smoker but has been exposed to tobacco smoke. she has never used smokeless tobacco. She reports that she does not drink alcohol or use drugs.    Allergies  Allergen Reactions  . Hydromorphone Other (See Comments)    Cognitive changes     Medications:  Prior to Admission medications   Medication Sig Start Date End Date Taking? Authorizing Provider  amLODipine (NORVASC) 5 MG tablet Take 1 tablet (5 mg total) by mouth daily. 10/30/16  Yes Burchette, Alinda Sierras, MD  Calcium Carb-Cholecalciferol (CALCIUM CARBONATE-VITAMIN D3 PO) Take 1 tablet by mouth daily   Yes [provider]  cholecalciferol  (VITAMIN D) 1000 UNITS tablet Take 1,000 Units by mouth daily.     Yes [provider]  denosumab (PROLIA) 60 MG/ML SOLN Inject 60 mg into the skin every 6 (six) months.     Yes [provider]  diclofenac sodium (VOLTAREN) 1 % GEL Apply 2 g topically 4 (four) times daily. 09/04/16  Yes Kathrynn Ducking, MD  ferrous sulfate 325 (65 FE) MG tablet Take 325 mg by mouth 2 (two) times daily.    Yes [provider]  furosemide (LASIX) 40 MG tablet TAKE 1 TABLET DAILY 08/05/17  Yes Burchette, Alinda Sierras, MD  gabapentin (NEURONTIN) 300 MG capsule Take 2 capsules (600 mg total) by mouth 2 (two) times daily. 03/12/17  Yes Kathrynn Ducking, MD  Golimumab [facility] ARIA IV) Inject into the vein. Takes infusion every 8 wks.   Yes [provider]  KLOR-CON M10 10 MEQ tablet TAKE 1 TABLET TWICE A DAY Patient taking differently: TAKE 1 TABLET daily 08/27/16  Yes Burchette, Alinda Sierras, MD  Multiple Vitamins-Minerals (MULTIVITAMIN,TX-MINERALS) tablet Take 1 tablet by mouth daily.     Yes [provider]  oxyCODONE (OXYCONTIN) 10 mg 12 hr tablet Take 1 tablet (10 mg total) every 12 (twelve) hours by mouth. 07/29/17  Yes Burchette, Alinda Sierras, MD  Oxycodone HCl 10 MG TABS Take 1 tablet (10 mg total) by mouth every 6 (six) hours as needed. 08/23/17  Yes Burchette, Alinda Sierras, MD  predniSONE (DELTASONE) 1 MG tablet Take 4 tablets (4 mg total) by mouth daily with breakfast. 08/21/17  Yes Kathrynn Ducking, MD  simvastatin (ZOCOR) 5 MG tablet TAKE 1 TABLET AT BEDTIME 04/23/17  Yes Burchette, Alinda Sierras, MD    ROS:  Out of a complete 14 system review of symptoms, the patient complains only of the following symptoms, and all other reviewed systems are negative.  Ringing in the ears Back pain  Blood pressure 113/66, pulse 67, height 5\' 1"  (1.549 m), weight 144 lb 8 oz (65.5 kg).  Physical Exam  General: The patient is alert and cooperative at the time of the examination.  Skin: No  significant peripheral edema is noted.   Neurologic Exam  Mental status: The patient is alert and oriented x 3 at the time of the examination. The patient has apparent normal recent and remote memory, with an apparently normal attention span and concentration ability.   Cranial nerves: Facial symmetry is present. Speech is normal, no aphasia or dysarthria is noted. Extraocular movements are full. Visual fields are full with the left eye, the patient is visually impaired on the right.  Motor: The patient  has good strength in all 4 extremities, with exception of a prominent left foot drop.  Sensory examination: Soft touch sensation is symmetric on the face, arms, and legs.  Coordination: The patient has good finger-nose-finger and heel-to-shin bilaterally.  Gait and station: The patient has a wide-based gait, the patient normally walks with a walker.  With standing, she has a tendency to be stooped and lean to the left.  The patient has a negative Romberg, but this is slightly unsteady.  Tandem gait was not attempted.  Reflexes: Deep tendon reflexes are symmetric, somewhat brisk in the arms, depressed in the legs.   Assessment/Plan:  1.  Temporal arteritis  2.  Peripheral neuropathy  3.  Gait disturbance  4.  History of cervical myelopathy  5.  Left foot drop, L5 radiculopathy   6.  Bilateral carpal tunnel syndrome  Overall, the patient is doing fairly well.  She does not wish to have any treatment for her carpal tunnel syndrome at this time.  The patient will try dropping the prednisone to 3 mg a day, if she feels poorly on this dose, she will go back to 4 mg daily.  The patient will follow-up in 6 months.  Jill Alexanders MD 09/13/2017 10:19 AM  [facility] [address] [place], [place] [address]  Phone [phone] Fax [phone]

## 2017-10-08 ENCOUNTER — Other Ambulatory Visit: Payer: Self-pay | Admitting: Family Medicine

## 2017-10-09 ENCOUNTER — Other Ambulatory Visit: Payer: Self-pay | Admitting: Family Medicine

## 2017-10-09 NOTE — Telephone Encounter (Signed)
She should be on every 3 month office follow up cycle with meds to be filled here.  Looks like she is due to follow up here early February.

## 2017-10-10 DIAGNOSIS — I129 Hypertensive chronic kidney disease with stage 1 through stage 4 chronic kidney disease, or unspecified chronic kidney disease: Secondary | ICD-10-CM | POA: Diagnosis not present

## 2017-10-10 DIAGNOSIS — M4802 Spinal stenosis, cervical region: Secondary | ICD-10-CM | POA: Diagnosis not present

## 2017-10-10 DIAGNOSIS — R739 Hyperglycemia, unspecified: Secondary | ICD-10-CM | POA: Diagnosis not present

## 2017-10-10 DIAGNOSIS — R600 Localized edema: Secondary | ICD-10-CM | POA: Diagnosis not present

## 2017-10-10 DIAGNOSIS — J8410 Pulmonary fibrosis, unspecified: Secondary | ICD-10-CM | POA: Diagnosis not present

## 2017-10-10 DIAGNOSIS — E669 Obesity, unspecified: Secondary | ICD-10-CM | POA: Diagnosis not present

## 2017-10-10 DIAGNOSIS — J479 Bronchiectasis, uncomplicated: Secondary | ICD-10-CM | POA: Diagnosis not present

## 2017-10-10 DIAGNOSIS — M069 Rheumatoid arthritis, unspecified: Secondary | ICD-10-CM | POA: Diagnosis not present

## 2017-10-10 DIAGNOSIS — N179 Acute kidney failure, unspecified: Secondary | ICD-10-CM | POA: Diagnosis not present

## 2017-10-10 DIAGNOSIS — N183 Chronic kidney disease, stage 3 (moderate): Secondary | ICD-10-CM | POA: Diagnosis not present

## 2017-10-14 ENCOUNTER — Telehealth: Payer: Self-pay | Admitting: Neurology

## 2017-10-14 DIAGNOSIS — H4720 Unspecified optic atrophy: Secondary | ICD-10-CM | POA: Diagnosis not present

## 2017-10-14 DIAGNOSIS — H524 Presbyopia: Secondary | ICD-10-CM | POA: Diagnosis not present

## 2017-10-14 DIAGNOSIS — H04222 Epiphora due to insufficient drainage, left lacrimal gland: Secondary | ICD-10-CM | POA: Diagnosis not present

## 2017-10-14 DIAGNOSIS — Z961 Presence of intraocular lens: Secondary | ICD-10-CM | POA: Diagnosis not present

## 2017-10-14 NOTE — Telephone Encounter (Signed)
I have received blood work results from the primary care physician.  This was done on 10 October 2017.  BUN was 18, creatinine 1.14, sodium 140, potassium 3.6, chloride 100, CO2 33, calcium 9.2, total protein 6.5, albumin of 4.0, alkaline phosphatase of 86, AST 25, ALT 21, white blood count 7.9, hemoglobin 14.0, hematocrit 41.6, MCV of 92, platelets of 223.

## 2017-10-29 ENCOUNTER — Ambulatory Visit: Payer: Medicare Other | Admitting: Family Medicine

## 2017-10-29 DIAGNOSIS — M0589 Other rheumatoid arthritis with rheumatoid factor of multiple sites: Secondary | ICD-10-CM | POA: Diagnosis not present

## 2017-10-30 ENCOUNTER — Ambulatory Visit (INDEPENDENT_AMBULATORY_CARE_PROVIDER_SITE_OTHER): Payer: Medicare Other | Admitting: Family Medicine

## 2017-10-30 VITALS — BP 110/64 | HR 67 | Temp 97.9°F | Ht 61.0 in | Wt 143.5 lb

## 2017-10-30 DIAGNOSIS — R7309 Other abnormal glucose: Secondary | ICD-10-CM | POA: Diagnosis not present

## 2017-10-30 DIAGNOSIS — R52 Pain, unspecified: Secondary | ICD-10-CM | POA: Insufficient documentation

## 2017-10-30 DIAGNOSIS — E785 Hyperlipidemia, unspecified: Secondary | ICD-10-CM

## 2017-10-30 DIAGNOSIS — I10 Essential (primary) hypertension: Secondary | ICD-10-CM

## 2017-10-30 LAB — POCT GLUCOSE (DEVICE FOR HOME USE): Glucose Fasting, POC: 100 mg/dL — AB (ref 70–99)

## 2017-10-30 LAB — POCT GLYCOSYLATED HEMOGLOBIN (HGB A1C): HEMOGLOBIN A1C: 5.3

## 2017-10-30 MED ORDER — OXYCODONE HCL ER 10 MG PO T12A: 10.0000 mg | EXTENDED_RELEASE_TABLET | Freq: Two times a day (BID) | ORAL | 0 refills | Status: DC

## 2017-10-30 MED ORDER — OXYCODONE HCL 10 MG PO TABS: 10.0000 mg | ORAL_TABLET | Freq: Four times a day (QID) | ORAL | 0 refills | Status: DC | PRN

## 2017-10-30 NOTE — Patient Instructions (Signed)
Try to look for app to search for glycemic index of foods

## 2017-10-30 NOTE — Progress Notes (Signed)
Subjective:     Patient ID: [name], female   DOB: 12/07/1940, 77 y.o.   MRN: [id number]  HPI Patient is here for medical follow-up.  Chronic neck and back pain. She's been on Oxycontin for several years. This is generally controlling her pain well. She denies any adverse side effects. [place] controlled substance registry assessed and no red flags.  Will need urine drug screen today.    Hyperlipidemia which is treated with simvastatin. She will need lab work soon. She wishes to defer until later time if possible.  Recent nonfasting blood sugar per nephrology 2 weeks ago 149. This was about 2 hours after eating a couple of cookies. She has no history of diabetes. No polyuria or polydipsia. Patient requesting fasting glucose and A1c today. Does eat a fair number of high glycemic foods.   Hypertension treated with amlodipine. Blood pressure stable. No dizziness. No headaches. No chest pains.  Past Medical History:  Diagnosis Date  . Allergic rhinitis   . Anemia   . Anxiety   . Bronchiectasis   . Carpal tunnel syndrome 07/13/2015   Bilateral  . CVA (cerebral infarction) 10/2007   Right thalamic   . Diverticulosis of colon   . Gait disorder   . GERD (gastroesophageal reflux disease)   . History of breast cancer   . HTN (hypertension)   . Hyperlipidemia   . Idiopathic pulmonary fibrosis   . Left knee DJD   . Migraine   . Osteoporosis   . Peripheral neuropathy   . PMR (polymyalgia rheumatica) (HCC)   . Polyneuropathy in other diseases classified elsewhere ([place]) 02/17/2013  . Previous back surgery 10/09/12  . Renal insufficiency   . Rheumatoid arthritis ([place])   . Seizure disorder ([place])   . Spondylosis, cervical, with myelopathy 08/31/2015   C3-4 myelopathy  . Temporal arteritis (HCC)    Right eye blind, on steroids per [facility]/ Dr [name]  . Type II or unspecified type diabetes mellitus without mention of complication, not stated as uncontrolled    2nd to steriods  .  Vitamin D deficiency    Past Surgical History:  Procedure Laterality Date  . CARPAL TUNNEL RELEASE Right   . CATARACT EXTRACTION     OS - Summer 2010  . CERVICAL FUSION  2010   4 rods and pins in place  . CHOLECYSTECTOMY    . COLONOSCOPY  12/15/2011   Procedure: COLONOSCOPY;  Surgeon: [name], MD;  [facility]: [facility];  Service: Endoscopy;  Laterality: N/A;  . LUMBAR FUSION  04/2010   W/Mechanical fixation - [place]  2013   rods in hips (to stabilize).  Marland Kitchen MASTECTOMY    . NECK SURGERY    . rheumatoid nodule removal    . SPINAL FUSION  07/2010   T10-L2 interbody fusion / [facility]  . TONSILLECTOMY    . TOTAL KNEE ARTHROPLASTY     Left    reports that she is a non-smoker but has been exposed to tobacco smoke. she has never used smokeless tobacco. She reports that she does not drink alcohol or use drugs. family history includes Arthritis in her father and mother; Brain cancer in her mother; Breast cancer in her sister; Dementia in her father; Hypertension in her father and mother; Lung cancer in her sister. Allergies  Allergen Reactions  . Hydromorphone Other (See Comments)    Cognitive changes        Review of Systems  Constitutional:  Negative for fatigue.  Eyes: Negative for visual disturbance.  Respiratory: Negative for cough, chest tightness, shortness of breath and wheezing.   Cardiovascular: Negative for chest pain, palpitations and leg swelling.  Endocrine: Negative for polydipsia and polyuria.  Musculoskeletal: Positive for back pain and neck pain.  Neurological: Negative for dizziness, seizures, syncope, weakness, light-headedness and headaches.       Objective:   Physical Exam  Constitutional: She is oriented to person, place, and time. She appears well-developed and well-nourished.  Neck: Neck supple.  Cardiovascular: Normal rate and regular rhythm.  Pulmonary/Chest: Effort normal and breath sounds normal. No  respiratory distress. She has no wheezes. She has no rales.  Musculoskeletal: She exhibits no edema.  Neurological: She is alert and oriented to person, place, and time.       Assessment:     #1 chronic back pain stable  #2 hypertension stable and at goal  #3 dyslipidemia  #4 recent elevated non-fasting blood sugar. Fasting glucose today 100 and A1c 5.3%    Plan:     -discussed prevention of type 2 diabetes. Reduce high glycemic foods and this was discussed in some detail -Refills of OxyContin and oxycodone were given -Check urine drug screen -We'll plan lipid recheck at follow-up in 3 months  Eulas Post MD [facility]

## 2017-11-03 LAB — PAIN MGMT, PROFILE 8 W/CONF, U
6 Acetylmorphine: NEGATIVE ng/mL (ref <10)
ALCOHOL METABOLITES: NEGATIVE ng/mL (ref <500)
Amphetamines: NEGATIVE ng/mL (ref <500)
BENZODIAZEPINES: NEGATIVE ng/mL (ref <100)
Buprenorphine, Urine: NEGATIVE ng/mL (ref <5)
CODEINE: NEGATIVE ng/mL (ref <50)
CREATININE: 50 mg/dL
Cocaine Metabolite: NEGATIVE ng/mL (ref <150)
HYDROCODONE: NEGATIVE ng/mL (ref <50)
HYDROMORPHONE: NEGATIVE ng/mL (ref <50)
MDMA: NEGATIVE ng/mL (ref <500)
Marijuana Metabolite: NEGATIVE ng/mL (ref <20)
Morphine: NEGATIVE ng/mL (ref <50)
NOROXYCODONE: 2069 ng/mL — AB (ref <50)
Norhydrocodone: NEGATIVE ng/mL (ref <50)
OPIATES: NEGATIVE ng/mL (ref <100)
OXIDANT: NEGATIVE ug/mL (ref <200)
OXYCODONE: 983 ng/mL — AB (ref <50)
Oxycodone: POSITIVE ng/mL — AB (ref <100)
Oxymorphone: 923 ng/mL — ABNORMAL HIGH (ref <50)
pH: 6.92 (ref 4.5–9.0)

## 2017-11-08 ENCOUNTER — Telehealth: Payer: Self-pay | Admitting: *Deleted

## 2017-11-08 NOTE — Telephone Encounter (Signed)
Copied from [place] [id number]. Topic: Quick Communication - Lab Results >> Nov 07, 2017 12:34 PM Wynetta Emery, [place] C wrote: Pt called in with questions about her lab results. Pt says that she have an apt so if possible to could office call her back this afternoon after 3:30p  Please call back at [phone]

## 2017-11-11 NOTE — Telephone Encounter (Signed)
Spoke with patient.

## 2017-11-19 IMAGING — CT CT CHEST HIGH RESOLUTION W/O CM
2 of 6 series · 15 of 36 positions shown, 18 images · non-contrast
Comparison: 12/01/2014 chest radiograph. 07/30/2006 high-resolution
chest CT.

CLINICAL DATA: Follow-up interstitial lung disease. Worsening
dyspnea over the prior 6 months.

EXAM:
CT CHEST WITHOUT CONTRAST
TECHNIQUE: Multidetector CT imaging of the chest was performed following the
standard protocol without intravenous contrast. High resolution
imaging of the lungs, as well as inspiratory and expiratory imaging,
was performed.

[Series 2: high resolution · axial · 0.59mm/px · z∈[-293,-21]mm · 12 of 153 slices shown, 15 images]
[im 9/153  mediastinal]
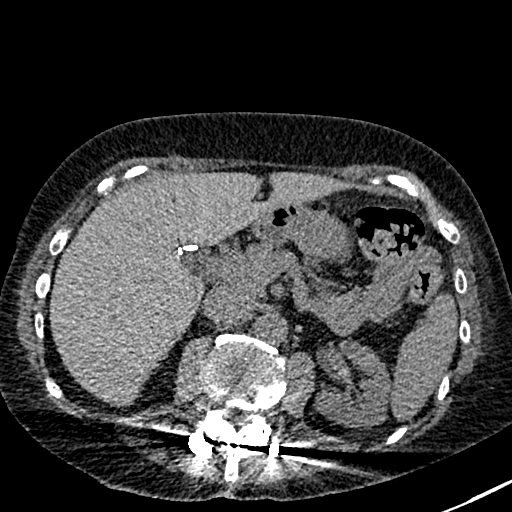
[im 9/153  lung]
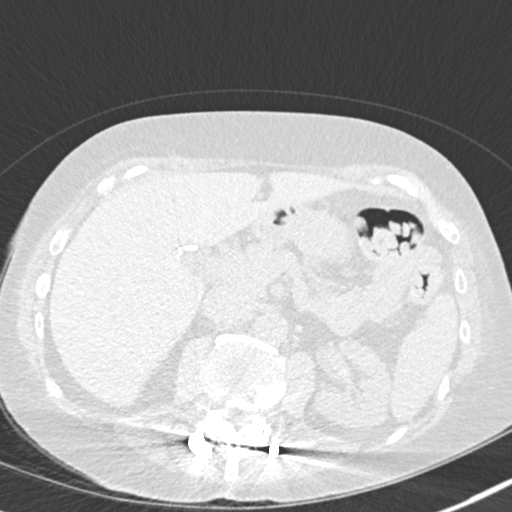
[im 25/153  lung]
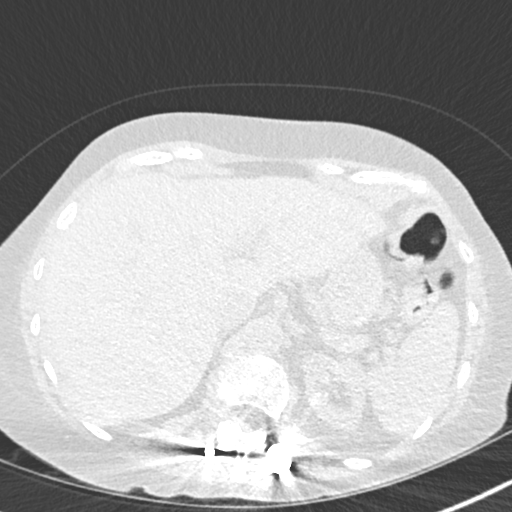
[im 33/153  lung]
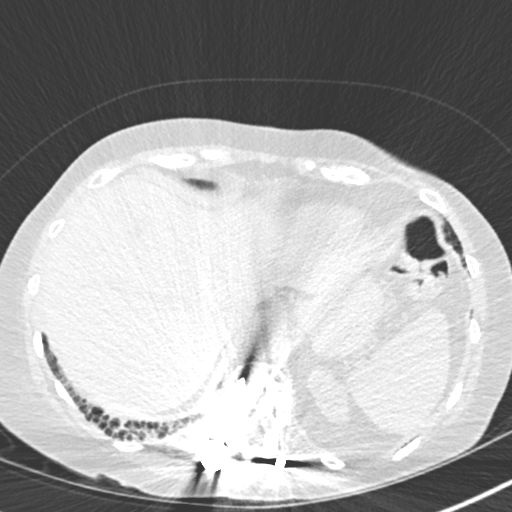
[im 49/153  lung]
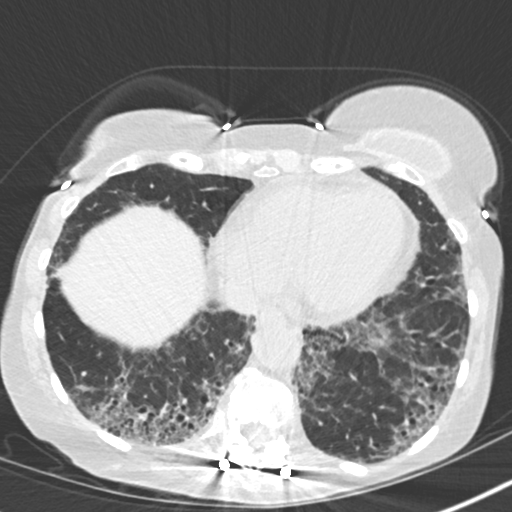
[im 57/153  mediastinal]
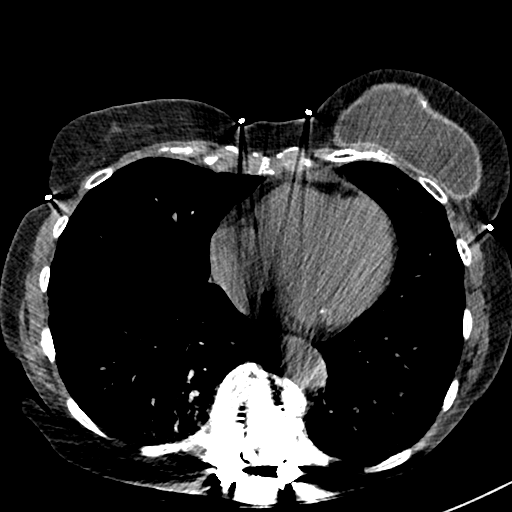
[im 57/153  lung]
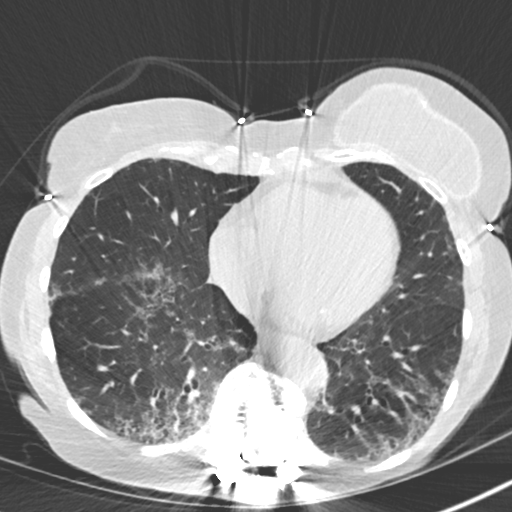
[im 73/153  lung]
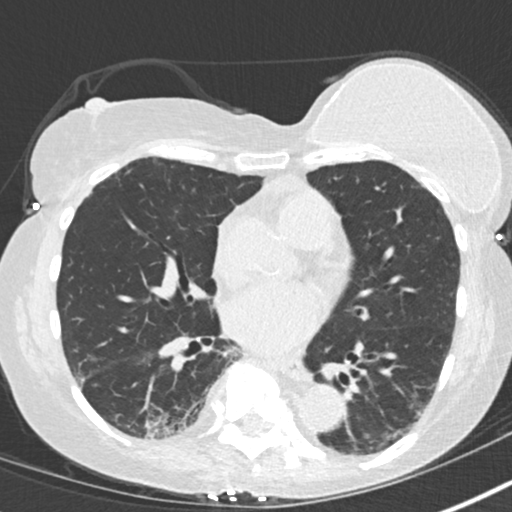
[im 81/153  lung]
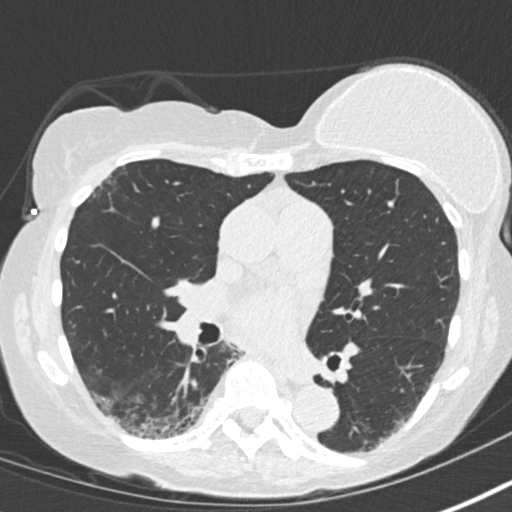
[im 97/153  lung]
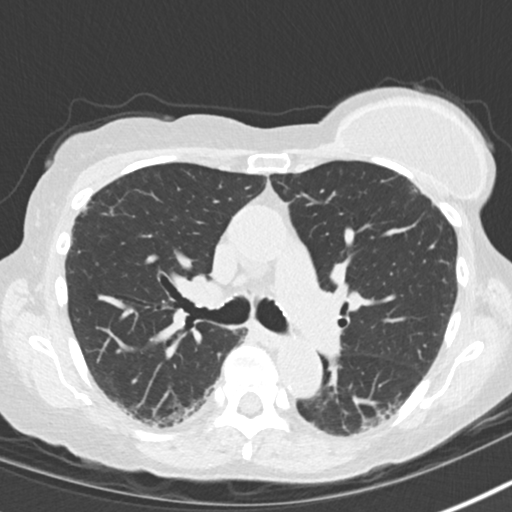
[im 105/153  mediastinal]
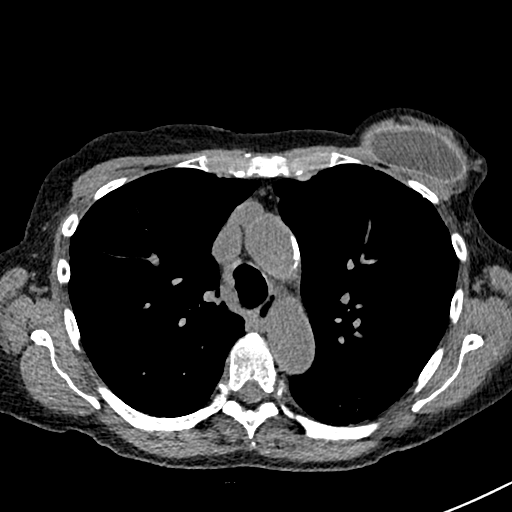
[im 105/153  lung]
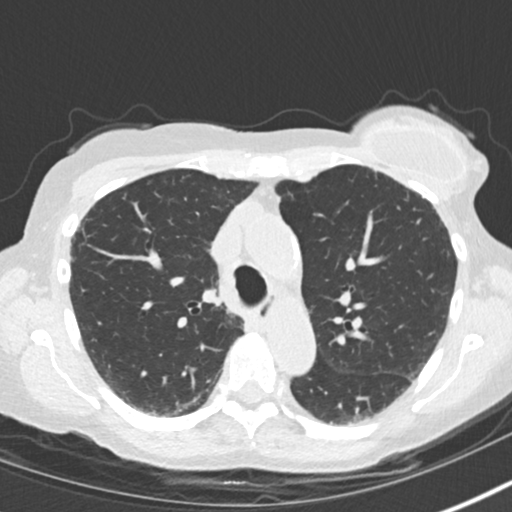
[im 121/153  lung]
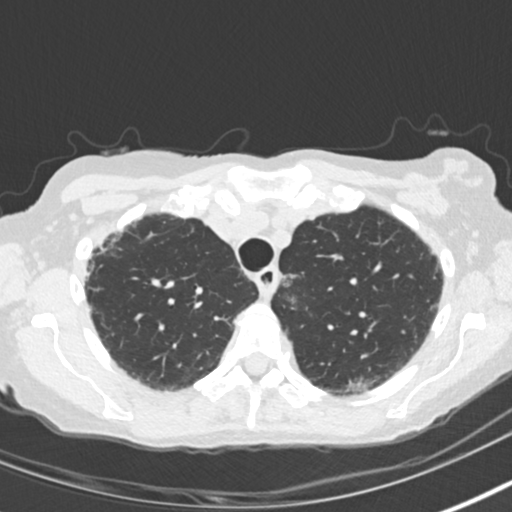
[im 129/153  lung]
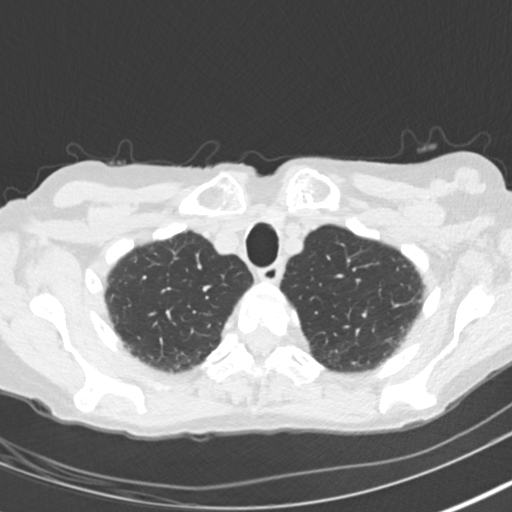
[im 145/153  lung]
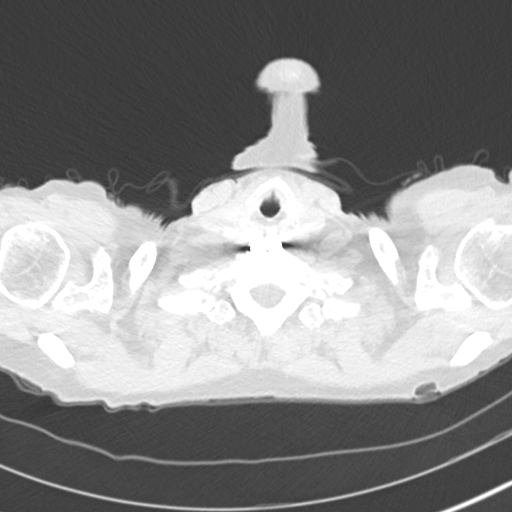

[Series 8: coronal · coronal · 0.59mm/px · 3 of 109 slices shown]
[im 22/109  lung]
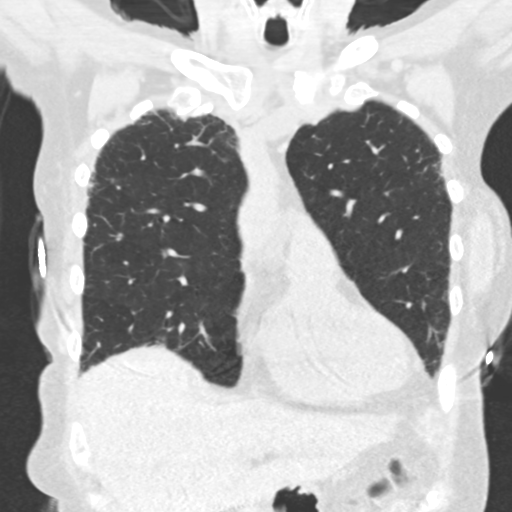
[im 44/109  lung]
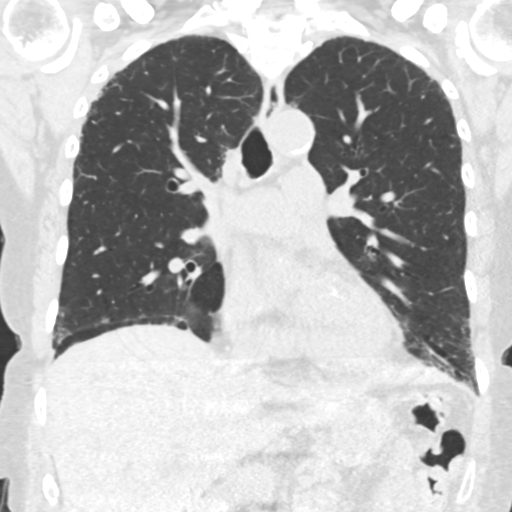
[im 65/109  lung]
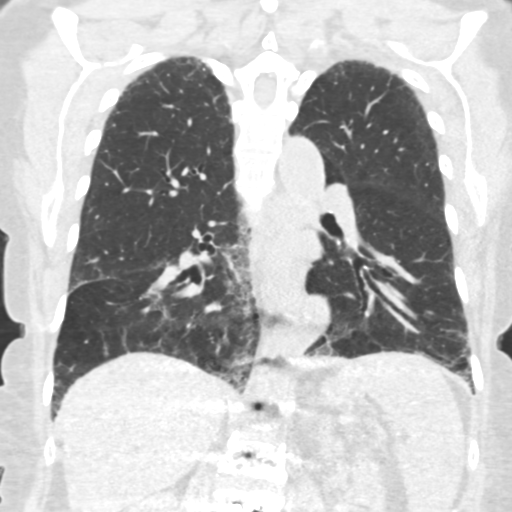

[15 of 36 positions shown; findings below may reference images not displayed]

FINDINGS: Cardiovascular: Normal heart size. Trace pericardial effusion/
thickening, not appreciably changed. Atherosclerotic nonaneurysmal
thoracic aorta. Normal caliber pulmonary arteries.

Mediastinum/Nodes: No discrete thyroid nodules. Unremarkable
esophagus. No pathologically enlarged axillary, mediastinal or gross
hilar lymph nodes, noting limited sensitivity for the detection of
hilar adenopathy on this noncontrast study.

Lungs/Pleura: No pneumothorax. No pleural effusion. No acute
consolidative airspace disease, lung masses or significant pulmonary
nodules. There is patchy subpleural reticulation and ground-glass
attenuation throughout both lungs with associated mild-to-moderate
traction bronchiectasis. There is a clear basilar gradient to these
findings. There is mild-to-moderate honeycombing at both lung bases,
asymmetrically involving the basilar right lower lobe. These
findings have progressed since 07/30/2006, particularly the
honeycombing. No significant air trapping on the expiration
sequence.

Upper abdomen: Cholecystectomy.

Musculoskeletal: No aggressive appearing focal osseous lesions.
Marked thoracic spondylosis, most prominent at T10-11. Partially
visualized surgical hardware from ACDF in the lower cervical spine.
Partially visualized bilateral posterior spinal fusion hardware in
the lower thoracic and lumbar spine. Intact appearing left breast
prosthesis.
IMPRESSION: Interval progression of basilar predominant fibrotic interstitial
lung disease with honeycombing. Although the rate of progression is
slow, these findings are considered diagnostic of usual interstitial
pneumonia (UIP).

Aortic Atherosclerosis ([id number]-[id number].O).

## 2017-11-22 ENCOUNTER — Telehealth: Payer: Self-pay | Admitting: *Deleted

## 2017-11-22 NOTE — Telephone Encounter (Signed)
Copied from [place]. Topic: Medical Record Request - Other >> Nov 21, 2017  2:50 PM Hewitt Shorts wrote: Pt is requesting a copy of most recent labs mailed to home address -best number [phone]   Route to [employer] for [place] clinics. For all other clinics, route to the clinic's PEC Pool.  Mailed to home address per patient request

## 2017-12-09 ENCOUNTER — Telehealth: Payer: Self-pay | Admitting: Family Medicine

## 2017-12-09 DIAGNOSIS — R269 Unspecified abnormalities of gait and mobility: Secondary | ICD-10-CM

## 2017-12-09 DIAGNOSIS — R2689 Other abnormalities of gait and mobility: Secondary | ICD-10-CM

## 2017-12-09 NOTE — Telephone Encounter (Signed)
Agree.  OK to set up.

## 2017-12-09 NOTE — Telephone Encounter (Signed)
Referral placed.

## 2017-12-09 NOTE — Telephone Encounter (Signed)
Copied from [place] [phone]. Topic: Quick Communication - See Telephone Encounter >> Dec 09, 2017  9:25 AM Ether Griffins B wrote: CRM for notification. See Telephone encounter for:  Pt's requesting PT for balance. pt has neuropathy in feet and thought it might help with her balance.  12/09/17.

## 2017-12-10 DIAGNOSIS — M810 Age-related osteoporosis without current pathological fracture: Secondary | ICD-10-CM | POA: Diagnosis not present

## 2017-12-19 ENCOUNTER — Ambulatory Visit: Payer: Medicare Other | Admitting: Physical Therapy

## 2017-12-24 DIAGNOSIS — M0589 Other rheumatoid arthritis with rheumatoid factor of multiple sites: Secondary | ICD-10-CM | POA: Diagnosis not present

## 2017-12-30 ENCOUNTER — Ambulatory Visit: Payer: Medicare Other | Attending: Family Medicine | Admitting: Physical Therapy

## 2017-12-30 ENCOUNTER — Encounter: Payer: Self-pay | Admitting: Physical Therapy

## 2017-12-30 ENCOUNTER — Other Ambulatory Visit: Payer: Self-pay

## 2017-12-30 DIAGNOSIS — G8929 Other chronic pain: Secondary | ICD-10-CM | POA: Insufficient documentation

## 2017-12-30 DIAGNOSIS — M545 Low back pain: Secondary | ICD-10-CM | POA: Insufficient documentation

## 2017-12-30 DIAGNOSIS — M6281 Muscle weakness (generalized): Secondary | ICD-10-CM | POA: Diagnosis not present

## 2017-12-30 DIAGNOSIS — R262 Difficulty in walking, not elsewhere classified: Secondary | ICD-10-CM | POA: Insufficient documentation

## 2017-12-30 DIAGNOSIS — R293 Abnormal posture: Secondary | ICD-10-CM

## 2017-12-30 NOTE — Therapy (Signed)
[facility] [address], [place], [place], [address] Phone: [phone]   Fax:  [phone]  Physical Therapy Evaluation  Patient Details  Name: [name] MRN: [id number] Date of Birth: Jun 03, 1941 Referring Provider: Eulas Post   Encounter Date: 12/30/2017  PT End of Session - 12/30/17 1142    Visit Number  1    Date for PT Re-Evaluation  02/24/18    PT Start Time  5329    PT Stop Time  1225    PT Time Calculation (min)  40 min    Activity Tolerance  Patient tolerated treatment well    Behavior During Therapy  [facility] for tasks assessed/performed       Past Medical History:  Diagnosis Date  . Allergic rhinitis   . Anemia   . Anxiety   . Bronchiectasis   . Carpal tunnel syndrome 07/13/2015   Bilateral  . CVA (cerebral infarction) 10/2007   Right thalamic   . Diverticulosis of colon   . Gait disorder   . GERD (gastroesophageal reflux disease)   . History of breast cancer   . HTN (hypertension)   . Hyperlipidemia   . Idiopathic pulmonary fibrosis   . Left knee DJD   . Migraine   . Osteoporosis   . Peripheral neuropathy   . PMR (polymyalgia rheumatica) (HCC)   . Polyneuropathy in other diseases classified elsewhere ([place]) 02/17/2013  . Previous back surgery 10/09/12  . Renal insufficiency   . Rheumatoid arthritis ([place])   . Seizure disorder ([place])   . Spondylosis, cervical, with myelopathy 08/31/2015   C3-4 myelopathy  . Temporal arteritis (HCC)    Right eye blind, on steroids per Neruro/ Dr [name]  . Type II or unspecified type diabetes mellitus without mention of complication, not stated as uncontrolled    2nd to steriods  . Vitamin D deficiency     Past Surgical History:  Procedure Laterality Date  . CARPAL TUNNEL RELEASE Right   . CATARACT EXTRACTION     OS - Summer 2010  . CERVICAL FUSION  2010   4 rods and pins in place  . CHOLECYSTECTOMY    . COLONOSCOPY  12/15/2011   Procedure:  COLONOSCOPY;  Surgeon: [name], MD;  Location: [facility];  Service: Endoscopy;  Laterality: N/A;  . LUMBAR FUSION  04/2010   W/Mechanical fixation - [place]  2013   rods in hips (to stabilize).  Marland Kitchen MASTECTOMY    . NECK SURGERY    . rheumatoid nodule removal    . SPINAL FUSION  07/2010   T10-L2 interbody fusion / [facility]  . TONSILLECTOMY    . TOTAL KNEE ARTHROPLASTY     Left    There were no vitals filed for this visit.   Subjective Assessment - 12/30/17 1148    Subjective  Pt states she is here to get more stable.  States she is feeling wobbly when walking.  Recently got an orthotic measurement and will get in 2-3 weeks.  Want to be able to stand and sit up straight     Pertinent History  3 back surgeries, 2 neck surgeries, osteoporosis    How long can you stand comfortably?   a few minutes, then back hurts    How long can you walk comfortably?  as long as I want with rollator, about 100 feet with straight cane and unable     Patient Stated Goals  walk more upright    Currently in Pain?  Yes    Pain Score  6  can get up to 5-6 with walking and standing    Pain Location  Back    Pain Orientation  Mid;Lower    Pain Descriptors / Indicators  Aching    Pain Type  Chronic pain    Pain Onset  More than a month ago    Pain Frequency  Intermittent    Aggravating Factors   standing and walking    Pain Relieving Factors  sitting or using rollator    Effect of Pain on Daily Activities  able to get places with LRAD    Multiple Pain Sites  No         [facility] PT Assessment - 12/30/17 0001      Assessment   Medical Diagnosis  R26.89 (ICD-10-CM) - Balance problem; R26.9 (ICD-10-CM) - Gait difficult    Referring Provider  Eulas Post    Onset Date/Surgical Date  -- chronic    Prior Therapy  Yes      Precautions   Precautions  None      Restrictions   Weight Bearing Restrictions  Yes      Balance Screen   Has the patient fallen in  the past 6 months  No      [place] residence    Living Arrangements  Alone widowed in 2009      Prior Function   Level of Independence  Independent      Cognition   Overall Cognitive Status  Within Functional Limits for tasks assessed      Posture/Postural Control   Posture/Postural Control  Postural limitations    Postural Limitations  Flexed trunk;Weight shift left in standing    Posture Comments  left leg length discrepancy, pelvis lower on left in standing      ROM / Strength   AROM / PROM / Strength  Strength      Strength   Overall Strength Comments  BLE hip flex, and core 4/5      Palpation   Palpation comment  muscle spasm left lumbar paraspinals      Ambulation/Gait   Gait Pattern  Trunk flexed;Lateral trunk lean to left;Trendelenburg left foot drop when tired      Standardized Balance Assessment   Five times sit to stand comments   19 sec 12.6 sec is age related norm      Furniture conservator/restorer   Sit to Stand  Able to stand without using hands and stabilize independently    Standing Unsupported  Able to stand safely 2 minutes    Sitting with Back Unsupported but Feet Supported on Floor or Stool  Able to sit safely and securely 2 minutes    Stand to Sit  Sits safely with minimal use of hands    Transfers  Able to transfer safely, minor use of hands    Standing Unsupported with Eyes Closed  Able to stand 10 seconds with supervision    Standing Ubsupported with Feet Together  Able to place feet together independently and stand for 1 minute with supervision    From Standing, Reach Forward with Outstretched Arm  Can reach forward >5 cm safely (2")    From Standing Position, Pick up Object from [place] to pick up shoe safely and easily    From Standing Position, Turn to Look Behind Over each Shoulder  Turn  sideways only but maintains balance    Turn 360 Degrees  Able to turn 360 degrees safely one side only in 4 seconds or less     Standing Unsupported, Alternately Place Feet on Step/Stool  Able to stand independently and safely and complete 8 steps in 20 seconds    Standing Unsupported, One Foot in Front  Able to plae foot ahead of the other independently and hold 30 seconds    Standing on One Leg  Tries to lift leg/unable to hold 3 seconds but remains standing independently 1 sec    Total Score  45      Timed Up and Go Test   Normal TUG (seconds)  12 12, 10, 15 (12 sec average of 3)                Objective measurements completed on examination: See above findings.                PT Short Term Goals - 12/30/17 1450      PT SHORT TERM GOAL #1   Title  ind with initial HEP    Baseline  not issued yet    Time  4    Period  Weeks    Status  New    Target Date  01/27/18      PT SHORT TERM GOAL #2   Title  Pt will be able to tolerate standing for at least 8 minutes at a time due to improved core strength and to achieve greater functional abilities.    Time  4    Period  Weeks    Status  New    Target Date  01/27/18      PT SHORT TERM GOAL #3   Title  Pt will perform TUG at > or = to 10 sec for reduced risk of falls    Time  4    Period  Weeks    Status  New    Target Date  01/27/18      PT SHORT TERM GOAL #4   Title  ...        PT Long Term Goals - 12/30/17 1233      PT LONG TERM GOAL #1   Title  Pt will be ind with advanced HEP    Time  8    Period  Weeks    Status  New    Target Date  02/24/18      PT LONG TERM GOAL #2   Title  Pt will perform 5x sit to stand < or = to 13 sec for decreased risk of falls    Baseline  19 seconds    Time  8    Period  Weeks    Status  New    Target Date  02/24/18      PT LONG TERM GOAL #3   Title  Pt will increase Berg Balance Test score to 50/56 for reduced fall risk.    Baseline  45/56 at eval    Time  8    Period  Weeks    Status  New    Target Date  02/24/18      PT LONG TERM GOAL #4   Title  Pt will be able to ambulate  200' on level surfaces with a cane in order to go out to eat and participate in community activities with family and friends.    Baseline  100 ft until back starts to really hurt  Time  8    Period  Weeks    Status  New    Target Date  02/24/18      PT LONG TERM GOAL #5   Title  Pt will be able to stand for > or = to 20 minutes without significantly increased pain in order to perform house hold chores and ADLs    Baseline  a few minutes    Time  8    Period  Weeks    Status  New    Target Date  02/24/18             Plan - 12/30/17 1453    Clinical Impression Statement  Pt presents to clinic today due to increased difficulty standing and walking due to inability to stand up straight.  Pt states she is having back pain when trying to stand up straighter.  Pt demonstrates postural limitations as stated above.  Left hip is lower in standing due to left leg length discrepancy.  Pt has LE and core weakness.  She has muscle spasms along lumbar spine Lt>Rt.  Pt has gait abnormalities as listed below and left foot drop when fatigued . She ambulates with [facility] for very short distances and rollator for longer community ambulation. Pt scored 45/56 on BERG which places her at increased risk of falls.  Pt also scored above age related norms for 5x sit to stand . Pt will benefit from skilled PT to address these deficits in order to obtain maximum function and community activities.    History and Personal Factors relevant to plan of care:  history of back surgeries multiple levels of fusion, osteoporosis    Clinical Presentation  Evolving    Clinical Presentation due to:  pt has noticed symptoms of back pain and impaired balance worsening    Clinical Decision Making  Moderate    Rehab Potential  Good    PT Frequency  2x / week    PT Duration  8 weeks    PT Treatment/Interventions  ADLs/Self Care Home Management;Biofeedback;Cryotherapy;Electrical Stimulation;Moist Heat;Ultrasound;Gait training;Stair  training;Functional mobility training;Therapeutic activities;Therapeutic exercise;Balance training;Neuromuscular re-education;Patient/family education;Manual techniques;Passive range of motion;Dry needling;Taping    PT Next Visit Plan  balance, core and LE strength, discuss heel lift, posture, gait, STM to left lumbar trigger points/muscle spasms    Consulted and Agree with Plan of Care  Patient       Patient will benefit from skilled therapeutic intervention in order to improve the following deficits and impairments:  Pain, Increased fascial restricitons, Postural dysfunction, Increased muscle spasms, Difficulty walking, Decreased strength, Abnormal gait, Impaired flexibility  Visit Diagnosis: Difficulty in walking, not elsewhere classified - Plan: PT plan of care cert/re-cert  Muscle weakness (generalized) - Plan: PT plan of care cert/re-cert  Abnormal posture - Plan: PT plan of care cert/re-cert  Chronic low back pain, unspecified back pain laterality, with sciatica presence unspecified - Plan: PT plan of care cert/re-cert     Problem List Patient Active Problem List   Diagnosis Date Noted  . Pain management 10/30/2017  . Rheumatoid arthritis ([place]) 04/25/2017  . Spondylosis, cervical, with myelopathy 08/31/2015  . Carpal tunnel syndrome 07/13/2015  . Obesity (BMI 30-39.9) 04/06/2014  . Shortness of breath 07/14/2013  . Bone cyst 07/14/2013  . Polyneuropathy in other diseases classified elsewhere ([place]) 02/17/2013  . Chronic back pain 12/09/2012  . Peripheral edema 09/25/2012  . Anemia due to chronic illness 09/01/2012  . Cervicalgia 07/11/2012  . Disturbance of skin sensation 07/11/2012  .  Temporal arteritis ([place]) 04/01/2012  . Lower GI bleed 12/24/2011  . Rectal bleed 12/13/2011  . Chronic renal insufficiency, stage II (mild) 04/04/2011  . LUMBAR RADICULOPATHY, LEFT 04/18/2009  . Essential hypertension 12/30/2007  . SEIZURE DISORDER 12/30/2007  . CEREBROVASCULAR  ACCIDENT, HX OF 12/30/2007  . Hyperlipidemia 10/08/2007  . GERD 10/08/2007  . DIVERTICULOSIS, COLON 10/08/2007  . BRONCHIECTASIS 06/26/2007  . PULMONARY FIBROSIS 05/17/2007  . Osteoporosis 05/17/2007  . BREAST CANCER, HX OF 05/17/2007  . MASTECTOMY, LEFT, HX OF 05/17/2007    Zannie Cove, PT 12/30/2017, 3:24 PM  [facility] [address], [place], [place], [address] Phone: [phone]   Fax:  [phone]  Name: [name] MRN: [id number] Date of Birth: 1941-02-26

## 2018-01-02 ENCOUNTER — Encounter: Payer: Self-pay | Admitting: Physical Therapy

## 2018-01-02 ENCOUNTER — Ambulatory Visit: Payer: Medicare Other | Admitting: Physical Therapy

## 2018-01-02 DIAGNOSIS — R262 Difficulty in walking, not elsewhere classified: Secondary | ICD-10-CM | POA: Diagnosis not present

## 2018-01-02 DIAGNOSIS — R293 Abnormal posture: Secondary | ICD-10-CM

## 2018-01-02 DIAGNOSIS — G8929 Other chronic pain: Secondary | ICD-10-CM

## 2018-01-02 DIAGNOSIS — M6281 Muscle weakness (generalized): Secondary | ICD-10-CM

## 2018-01-02 DIAGNOSIS — M545 Low back pain: Secondary | ICD-10-CM | POA: Diagnosis not present

## 2018-01-02 NOTE — Therapy (Signed)
[facility] [address], [place], [place], [address] Phone: [phone]   Fax:  [phone]  Physical Therapy Treatment  Patient Details  Name: [name] MRN: [id number] Date of Birth: 1941/05/28 Referring Provider: Eulas Post   Encounter Date: 01/02/2018  PT End of Session - 01/02/18 1435    Visit Number  2    Date for PT Re-Evaluation  02/24/18    PT Start Time  1400    PT Stop Time  1440    PT Time Calculation (min)  40 min    Activity Tolerance  Patient tolerated treatment well    Behavior During Therapy  [facility] for tasks assessed/performed       Past Medical History:  Diagnosis Date  . Allergic rhinitis   . Anemia   . Anxiety   . Bronchiectasis   . Carpal tunnel syndrome 07/13/2015   Bilateral  . CVA (cerebral infarction) 10/2007   Right thalamic   . Diverticulosis of colon   . Gait disorder   . GERD (gastroesophageal reflux disease)   . History of breast cancer   . HTN (hypertension)   . Hyperlipidemia   . Idiopathic pulmonary fibrosis   . Left knee DJD   . Migraine   . Osteoporosis   . Peripheral neuropathy   . PMR (polymyalgia rheumatica) (HCC)   . Polyneuropathy in other diseases classified elsewhere ([place]) 02/17/2013  . Previous back surgery 10/09/12  . Renal insufficiency   . Rheumatoid arthritis ([place])   . Seizure disorder ([place])   . Spondylosis, cervical, with myelopathy 08/31/2015   C3-4 myelopathy  . Temporal arteritis (HCC)    Right eye blind, on steroids per Neruro/ Dr [name]  . Type II or unspecified type diabetes mellitus without mention of complication, not stated as uncontrolled    2nd to steriods  . Vitamin D deficiency     Past Surgical History:  Procedure Laterality Date  . CARPAL TUNNEL RELEASE Right   . CATARACT EXTRACTION     OS - Summer 2010  . CERVICAL FUSION  2010   4 rods and pins in place  . CHOLECYSTECTOMY    . COLONOSCOPY  12/15/2011   Procedure:  COLONOSCOPY;  Surgeon: [name], MD;  Location: [facility];  Service: Endoscopy;  Laterality: N/A;  . LUMBAR FUSION  04/2010   W/Mechanical fixation - [place]  2013   rods in hips (to stabilize).  Marland Kitchen MASTECTOMY    . NECK SURGERY    . rheumatoid nodule removal    . SPINAL FUSION  07/2010   T10-L2 interbody fusion / [facility]  . TONSILLECTOMY    . TOTAL KNEE ARTHROPLASTY     Left    There were no vitals filed for this visit.  Subjective Assessment - 01/02/18 1358    Subjective  Lt foot is really having a lot of pain, has been fitted for orthotic and is awaiting for it to be made (will arrive 4/30).      Patient Stated Goals  walk more upright    Currently in Pain?  Yes    Pain Score  6     Pain Location  Foot    Pain Orientation  Left;Posterior    Pain Descriptors / Indicators  Aching    Pain Type  Chronic pain    Pain Onset  More than a month ago    Pain Frequency  Intermittent  Aggravating Factors   weight bearing    Pain Relieving Factors  sitting                       [facility] Adult PT Treatment/Exercise - 01/02/18 1408      Exercises   Exercises  Knee/Hip;Lumbar      Lumbar Exercises: Aerobic   Recumbent Bike  L1 x 5 min pt has recumbent bike at home      Lumbar Exercises: Supine   Other Supine Lumbar Exercises  lumbar decompression series x 10 reps bil      Knee/Hip Exercises: Standing   Hip Flexion  Both;10 reps;Knee bent    Hip Abduction  Both;10 reps;Knee straight    Hip Extension  Both;10 reps;Knee straight          Balance Exercises - 01/02/18 1413      Balance Exercises: Standing   Tandem Stance  Eyes open;Upper extremity support 2;3 reps;20 secs    SLS  Eyes open;Solid surface;Upper extremity support 2;3 reps;20 secs        PT Education - 01/02/18 1435    Education provided  Yes    Education Details  standing HEP    Person(s) Educated  Patient    Methods   Explanation;Demonstration;Handout    Comprehension  Verbalized understanding;Need further instruction;Returned demonstration       PT Short Term Goals - 12/30/17 1450      PT SHORT TERM GOAL #1   Title  ind with initial HEP    Baseline  not issued yet    Time  4    Period  Weeks    Status  New    Target Date  01/27/18      PT SHORT TERM GOAL #2   Title  Pt will be able to tolerate standing for at least 8 minutes at a time due to improved core strength and to achieve greater functional abilities.    Time  4    Period  Weeks    Status  New    Target Date  01/27/18      PT SHORT TERM GOAL #3   Title  Pt will perform TUG at > or = to 10 sec for reduced risk of falls    Time  4    Period  Weeks    Status  New    Target Date  01/27/18      PT SHORT TERM GOAL #4   Title  ...        PT Long Term Goals - 12/30/17 1233      PT LONG TERM GOAL #1   Title  Pt will be ind with advanced HEP    Time  8    Period  Weeks    Status  New    Target Date  02/24/18      PT LONG TERM GOAL #2   Title  Pt will perform 5x sit to stand < or = to 13 sec for decreased risk of falls    Baseline  19 seconds    Time  8    Period  Weeks    Status  New    Target Date  02/24/18      PT LONG TERM GOAL #3   Title  Pt will increase Berg Balance Test score to 50/56 for reduced fall risk.    Baseline  45/56 at eval    Time  8    Period  Weeks    Status  New    Target Date  02/24/18      PT LONG TERM GOAL #4   Title  Pt will be able to ambulate 200' on level surfaces with a cane in order to go out to eat and participate in community activities with family and friends.    Baseline  100 ft until back starts to really hurt    Time  8    Period  Weeks    Status  New    Target Date  02/24/18      PT LONG TERM GOAL #5   Title  Pt will be able to stand for > or = to 20 minutes without significantly increased pain in order to perform house hold chores and ADLs    Baseline  a few minutes    Time   8    Period  Weeks    Status  New    Target Date  02/24/18            Plan - 01/02/18 1416    Clinical Impression Statement  Session today focused on establishing HEP for balance and strengthening, needing min cues for posture and technique.  Pt also has recumbent bike at home so trialed today in anticipation of doing at home.  Will continue to benefit from PT to maximize function.    PT Treatment/Interventions  ADLs/Self Care Home Management;Biofeedback;Cryotherapy;Electrical Stimulation;Moist Heat;Ultrasound;Gait training;Stair training;Functional mobility training;Therapeutic activities;Therapeutic exercise;Balance training;Neuromuscular re-education;Patient/family education;Manual techniques;Passive range of motion;Dry needling;Taping    PT Next Visit Plan  review HEP, balance, core and LE strength, discuss heel lift, posture, gait, STM to left lumbar trigger points/muscle spasms    PT Home Exercise Plan  Access Code: [email or web address]    Consulted and Agree with Plan of Care  Patient       Patient will benefit from skilled therapeutic intervention in order to improve the following deficits and impairments:  Pain, Increased fascial restricitons, Postural dysfunction, Increased muscle spasms, Difficulty walking, Decreased strength, Abnormal gait, Impaired flexibility  Visit Diagnosis: Difficulty in walking, not elsewhere classified  Muscle weakness (generalized)  Abnormal posture  Chronic low back pain, unspecified back pain laterality, with sciatica presence unspecified     Problem List Patient Active Problem List   Diagnosis Date Noted  . Pain management 10/30/2017  . Rheumatoid arthritis ([place]) 04/25/2017  . Spondylosis, cervical, with myelopathy 08/31/2015  . Carpal tunnel syndrome 07/13/2015  . Obesity (BMI 30-39.9) 04/06/2014  . Shortness of breath 07/14/2013  . Bone cyst 07/14/2013  . Polyneuropathy in other diseases classified elsewhere ([place]) 02/17/2013  . Chronic back  pain 12/09/2012  . Peripheral edema 09/25/2012  . Anemia due to chronic illness 09/01/2012  . Cervicalgia 07/11/2012  . Disturbance of skin sensation 07/11/2012  . Temporal arteritis ([place]) 04/01/2012  . Lower GI bleed 12/24/2011  . Rectal bleed 12/13/2011  . Chronic renal insufficiency, stage II (mild) 04/04/2011  . LUMBAR RADICULOPATHY, LEFT 04/18/2009  . Essential hypertension 12/30/2007  . SEIZURE DISORDER 12/30/2007  . CEREBROVASCULAR ACCIDENT, HX OF 12/30/2007  . Hyperlipidemia 10/08/2007  . GERD 10/08/2007  . DIVERTICULOSIS, COLON 10/08/2007  . BRONCHIECTASIS 06/26/2007  . PULMONARY FIBROSIS 05/17/2007  . Osteoporosis 05/17/2007  . BREAST CANCER, HX OF 05/17/2007  . MASTECTOMY, LEFT, HX OF 05/17/2007      Laureen Abrahams, PT, DPT 01/02/18 2:39 PM    [facility] [address] [place], [place], [address] Phone: [phone]  Fax:  [phone]  Name: [name] MRN: [id number] Date of Birth: 04-20-1941

## 2018-01-02 NOTE — Patient Instructions (Signed)
Access Code: [email or web address]  URL: [email or web address]  Date: 01/02/2018  Prepared by: Faustino Congress   Exercises  Standing Hip Abduction with Counter Support - 10 reps - 1 sets - 1x daily - 7x weekly  Standing Hip Extension with Counter Support - 10 reps - 1 sets - 1x daily - 7x weekly  Standing March with Counter Support - 10 reps - 1 sets - 1x daily - 7x weekly  Standing Tandem Balance with Counter Support - 3 reps - 1 sets - 20 sec hold - 2x daily - 7x weekly  Standing Single Leg Stance with Unilateral Counter Support - 3 reps - 1 sets - 10 sec hold - 2x daily - 7x weekly

## 2018-01-07 ENCOUNTER — Ambulatory Visit: Payer: Medicare Other | Admitting: Physical Therapy

## 2018-01-07 ENCOUNTER — Encounter: Payer: Self-pay | Admitting: Physical Therapy

## 2018-01-07 VITALS — BP 152/90

## 2018-01-07 DIAGNOSIS — M6281 Muscle weakness (generalized): Secondary | ICD-10-CM | POA: Diagnosis not present

## 2018-01-07 DIAGNOSIS — R262 Difficulty in walking, not elsewhere classified: Secondary | ICD-10-CM

## 2018-01-07 DIAGNOSIS — R293 Abnormal posture: Secondary | ICD-10-CM | POA: Diagnosis not present

## 2018-01-07 DIAGNOSIS — G8929 Other chronic pain: Secondary | ICD-10-CM

## 2018-01-07 DIAGNOSIS — M545 Low back pain: Secondary | ICD-10-CM | POA: Diagnosis not present

## 2018-01-07 NOTE — Therapy (Addendum)
[facility] [address] [place], [place], [address] Phone: [phone]   Fax:  [phone]  Physical Therapy Treatment/Discharge  Patient Details  Name: [name] MRN: [id number] Date of Birth: 1941-08-13 Referring Provider: Eulas Post   Encounter Date: 01/07/2018  PT End of Session - 01/07/18 1524    Visit Number  3    Date for PT Re-Evaluation  02/24/18    PT Start Time  1445 time spent on phone call with [place]    PT Stop Time  1520    PT Time Calculation (min)  35 min    Activity Tolerance  Patient tolerated treatment well    Behavior During Therapy  [facility] for tasks assessed/performed       Past Medical History:  Diagnosis Date  . Allergic rhinitis   . Anemia   . Anxiety   . Bronchiectasis   . Carpal tunnel syndrome 07/13/2015   Bilateral  . CVA (cerebral infarction) 10/2007   Right thalamic   . Diverticulosis of colon   . Gait disorder   . GERD (gastroesophageal reflux disease)   . History of breast cancer   . HTN (hypertension)   . Hyperlipidemia   . Idiopathic pulmonary fibrosis   . Left knee DJD   . Migraine   . Osteoporosis   . Peripheral neuropathy   . PMR (polymyalgia rheumatica) (HCC)   . Polyneuropathy in other diseases classified elsewhere ([place]) 02/17/2013  . Previous back surgery 10/09/12  . Renal insufficiency   . Rheumatoid arthritis ([place])   . Seizure disorder ([place])   . Spondylosis, cervical, with myelopathy 08/31/2015   C3-4 myelopathy  . Temporal arteritis (HCC)    Right eye blind, on steroids per Neruro/ Dr [name]  . Type II or unspecified type diabetes mellitus without mention of complication, not stated as uncontrolled    2nd to steriods  . Vitamin D deficiency     Past Surgical History:  Procedure Laterality Date  . CARPAL TUNNEL RELEASE Right   . CATARACT EXTRACTION     OS - Summer 2010  . CERVICAL FUSION  2010   4 rods and pins in place  .  CHOLECYSTECTOMY    . COLONOSCOPY  12/15/2011   Procedure: COLONOSCOPY;  Surgeon: [name], MD;  [facility]: [facility];  Service: Endoscopy;  Laterality: N/A;  . LUMBAR FUSION  04/2010   W/Mechanical fixation - [place]  2013   rods in hips (to stabilize).  Marland Kitchen MASTECTOMY    . NECK SURGERY    . rheumatoid nodule removal    . SPINAL FUSION  07/2010   T10-L2 interbody fusion / [facility]  . TONSILLECTOMY    . TOTAL KNEE ARTHROPLASTY     Left    Vitals:   01/07/18 1449 01/07/18 1454 01/07/18 1457  BP: (!) 167/90 (!) 155/80 (!) 152/90    Subjective Assessment - 01/07/18 1449    Subjective  arrives today with c/o headache behind Lt eye; and busted blood vessels in Lt eye.    Patient Stated Goals  walk more upright    Pain Onset  More than a month ago                  Self-Care: Pt arrived to session today with c/o headache and noticed Lt burst capillaries in eye.  BP elevated when checked today.  Called pt's PCP and appt scheduled for tomorrow.  Educated pt  on warning signs of CVA and advised to call EMS if any changes in symptoms occur.  Pt verbalized understanding and appreciative of PT's assistance.  Session ended due to symptoms above.             PT Education - 01/07/18 1523    Education provided  Yes    Education Details  advised to call 9-1-1 if symptoms change; see session information    Person(s) Educated  Patient    Methods  Explanation    Comprehension  Verbalized understanding       PT Short Term Goals - 12/30/17 1450      PT SHORT TERM GOAL #1   Title  ind with initial HEP    Baseline  not issued yet    Time  4    Period  Weeks    Status  New    Target Date  01/27/18      PT SHORT TERM GOAL #2   Title  Pt will be able to tolerate standing for at least 8 minutes at a time due to improved core strength and to achieve greater functional abilities.    Time  4    Period  Weeks    Status  New    Target  Date  01/27/18      PT SHORT TERM GOAL #3   Title  Pt will perform TUG at > or = to 10 sec for reduced risk of falls    Time  4    Period  Weeks    Status  New    Target Date  01/27/18      PT SHORT TERM GOAL #4   Title  ...        PT Long Term Goals - 12/30/17 1233      PT LONG TERM GOAL #1   Title  Pt will be ind with advanced HEP    Time  8    Period  Weeks    Status  New    Target Date  02/24/18      PT LONG TERM GOAL #2   Title  Pt will perform 5x sit to stand < or = to 13 sec for decreased risk of falls    Baseline  19 seconds    Time  8    Period  Weeks    Status  New    Target Date  02/24/18      PT LONG TERM GOAL #3   Title  Pt will increase Berg Balance Test score to 50/56 for reduced fall risk.    Baseline  45/56 at eval    Time  8    Period  Weeks    Status  New    Target Date  02/24/18      PT LONG TERM GOAL #4   Title  Pt will be able to ambulate 200' on level surfaces with a cane in order to go out to eat and participate in community activities with family and friends.    Baseline  100 ft until back starts to really hurt    Time  8    Period  Weeks    Status  New    Target Date  02/24/18      PT LONG TERM GOAL #5   Title  Pt will be able to stand for > or = to 20 minutes without significantly increased pain in order to perform house hold chores and ADLs  Baseline  a few minutes    Time  8    Period  Weeks    Status  New    Target Date  02/24/18            Plan - 01/07/18 1524    Clinical Impression Statement  Pt arrived to session today with c/o headache and noticed Lt burst capillaries in eye.  BP elevated when checked today.  Called pt's PCP and appt scheduled for tomorrow.  Educated pt on warning signs of CVA and advised to call EMS if any changes in symptoms occur.  Pt verbalized understanding and appreciative of PT's assistance.    PT Treatment/Interventions  ADLs/Self Care Home Management;Biofeedback;Cryotherapy;Electrical  Stimulation;Moist Heat;Ultrasound;Gait training;Stair training;Functional mobility training;Therapeutic activities;Therapeutic exercise;Balance training;Neuromuscular re-education;Patient/family education;Manual techniques;Passive range of motion;Dry needling;Taping    PT Next Visit Plan  review HEP, balance, core and LE strength, discuss heel lift, posture, gait, STM to left lumbar trigger points/muscle spasms    PT Home Exercise Plan  Access Code: [email or web address]    Consulted and Agree with Plan of Care  Patient       Patient will benefit from skilled therapeutic intervention in order to improve the following deficits and impairments:  Pain, Increased fascial restricitons, Postural dysfunction, Increased muscle spasms, Difficulty walking, Decreased strength, Abnormal gait, Impaired flexibility  Visit Diagnosis: Difficulty in walking, not elsewhere classified  Muscle weakness (generalized)  Abnormal posture  Chronic low back pain, unspecified back pain laterality, with sciatica presence unspecified     Problem List Patient Active Problem List   Diagnosis Date Noted  . Pain management 10/30/2017  . Rheumatoid arthritis ([place]) 04/25/2017  . Spondylosis, cervical, with myelopathy 08/31/2015  . Carpal tunnel syndrome 07/13/2015  . Obesity (BMI 30-39.9) 04/06/2014  . Shortness of breath 07/14/2013  . Bone cyst 07/14/2013  . Polyneuropathy in other diseases classified elsewhere ([place]) 02/17/2013  . Chronic back pain 12/09/2012  . Peripheral edema 09/25/2012  . Anemia due to chronic illness 09/01/2012  . Cervicalgia 07/11/2012  . Disturbance of skin sensation 07/11/2012  . Temporal arteritis ([place]) 04/01/2012  . Lower GI bleed 12/24/2011  . Rectal bleed 12/13/2011  . Chronic renal insufficiency, stage II (mild) 04/04/2011  . LUMBAR RADICULOPATHY, LEFT 04/18/2009  . Essential hypertension 12/30/2007  . SEIZURE DISORDER 12/30/2007  . CEREBROVASCULAR ACCIDENT, HX OF 12/30/2007  .  Hyperlipidemia 10/08/2007  . GERD 10/08/2007  . DIVERTICULOSIS, COLON 10/08/2007  . BRONCHIECTASIS 06/26/2007  . PULMONARY FIBROSIS 05/17/2007  . Osteoporosis 05/17/2007  . BREAST CANCER, HX OF 05/17/2007  . MASTECTOMY, LEFT, HX OF 05/17/2007      Laureen Abrahams, PT, DPT 01/07/18 3:29 PM    [facility] [address] [place], [place], [address] Phone: [phone]   Fax:  [phone]  Name: BRIANNI MANTHE MRN: [id number] Date of Birth: Feb 22, 1941    PHYSICAL THERAPY DISCHARGE SUMMARY  Visits from Start of Care: 3  Current functional level related to goals / functional outcomes: See above   Remaining deficits: See above; pt cx appts due to elevated BP and did not reschedule after MD stated pt could return    Education / Equipment: HEP  Plan: Patient agrees to discharge.  Patient goals were not met. Patient is being discharged due to not returning since the last visit.  ?????      Laureen Abrahams, PT, DPT 05/22/18 10:37 AM    [facility] [place]  [place], [place] [address]  [phone] (office) [phone] (fax)

## 2018-01-08 ENCOUNTER — Ambulatory Visit (INDEPENDENT_AMBULATORY_CARE_PROVIDER_SITE_OTHER): Payer: Medicare Other | Admitting: Family Medicine

## 2018-01-08 ENCOUNTER — Encounter: Payer: Self-pay | Admitting: Family Medicine

## 2018-01-08 ENCOUNTER — Other Ambulatory Visit: Payer: Self-pay | Admitting: Family Medicine

## 2018-01-08 VITALS — BP 120/90 | HR 80 | Temp 98.2°F | Wt 146.9 lb

## 2018-01-08 DIAGNOSIS — Z6826 Body mass index (BMI) 26.0-26.9, adult: Secondary | ICD-10-CM | POA: Diagnosis not present

## 2018-01-08 DIAGNOSIS — I10 Essential (primary) hypertension: Secondary | ICD-10-CM | POA: Diagnosis not present

## 2018-01-08 DIAGNOSIS — R51 Headache: Secondary | ICD-10-CM | POA: Diagnosis not present

## 2018-01-08 DIAGNOSIS — M316 Other giant cell arteritis: Secondary | ICD-10-CM | POA: Diagnosis not present

## 2018-01-08 DIAGNOSIS — N184 Chronic kidney disease, stage 4 (severe): Secondary | ICD-10-CM | POA: Diagnosis not present

## 2018-01-08 DIAGNOSIS — H16102 Unspecified superficial keratitis, left eye: Secondary | ICD-10-CM | POA: Diagnosis not present

## 2018-01-08 DIAGNOSIS — M0589 Other rheumatoid arthritis with rheumatoid factor of multiple sites: Secondary | ICD-10-CM | POA: Diagnosis not present

## 2018-01-08 DIAGNOSIS — M79645 Pain in left finger(s): Secondary | ICD-10-CM | POA: Diagnosis not present

## 2018-01-08 DIAGNOSIS — J8410 Pulmonary fibrosis, unspecified: Secondary | ICD-10-CM | POA: Diagnosis not present

## 2018-01-08 DIAGNOSIS — H1132 Conjunctival hemorrhage, left eye: Secondary | ICD-10-CM | POA: Diagnosis not present

## 2018-01-08 DIAGNOSIS — M810 Age-related osteoporosis without current pathological fracture: Secondary | ICD-10-CM | POA: Diagnosis not present

## 2018-01-08 DIAGNOSIS — M79672 Pain in left foot: Secondary | ICD-10-CM | POA: Diagnosis not present

## 2018-01-08 DIAGNOSIS — M150 Primary generalized (osteo)arthritis: Secondary | ICD-10-CM | POA: Diagnosis not present

## 2018-01-08 DIAGNOSIS — E663 Overweight: Secondary | ICD-10-CM | POA: Diagnosis not present

## 2018-01-08 DIAGNOSIS — M5136 Other intervertebral disc degeneration, lumbar region: Secondary | ICD-10-CM | POA: Diagnosis not present

## 2018-01-08 NOTE — Patient Instructions (Signed)
Monitor blood pressure and be in touch if consistently > 140/90.   

## 2018-01-08 NOTE — Progress Notes (Signed)
Subjective:     Patient ID: [name], female   DOB: [birth year], 77 y.o.   MRN: [id number]  HPI Patient called issue today with concerns for elevated blood pressure. She had been at physical therapy and think she had reading up around 170/90. She's had some low-grade headache past few days. Past history of temporal arteritis. She also had subconjunctival hemorrhage of the left eye. She was told to follow-up today. She's actually had 2 other appointments earlier today including visit with ophthalmologist this morning and states her blood pressure 118/62 and she saw her rheumatologist earlier today with blood pressure 118/60.  She takes amlodipine 5 mg daily and compliant with therapy. Watches her sodium intake closely. No alcohol.  She's had no eye pain and no blurred vision. She does have history of temporal arteritis and is maintained on low-dose prednisone 4 mg daily. She states that some type of inflammatory marker blood test was taken by her rheumatologist earlier today.  Past Medical History:  Diagnosis Date  . Allergic rhinitis   . Anemia   . Anxiety   . Bronchiectasis   . Carpal tunnel syndrome 07/13/2015   Bilateral  . CVA (cerebral infarction) 10/2007   Right thalamic   . Diverticulosis of colon   . Gait disorder   . GERD (gastroesophageal reflux disease)   . History of breast cancer   . HTN (hypertension)   . Hyperlipidemia   . Idiopathic pulmonary fibrosis   . Left knee DJD   . Migraine   . Osteoporosis   . Peripheral neuropathy   . PMR (polymyalgia rheumatica) (HCC)   . Polyneuropathy in other diseases classified elsewhere ([place]) 02/17/2013  . Previous back surgery 10/09/12  . Renal insufficiency   . Rheumatoid arthritis ([place])   . Seizure disorder ([place])   . Spondylosis, cervical, with myelopathy 08/31/2015   C3-4 myelopathy  . Temporal arteritis (HCC)    Right eye blind, on steroids per Neruro/ Dr [name]  . Type II or unspecified type diabetes mellitus without  mention of complication, not stated as uncontrolled    2nd to steriods  . Vitamin D deficiency    Past Surgical History:  Procedure Laterality Date  . CARPAL TUNNEL RELEASE Right   . CATARACT EXTRACTION     OS - Summer 2010  . CERVICAL FUSION  2010   4 rods and pins in place  . CHOLECYSTECTOMY    . COLONOSCOPY  12/15/2011   Procedure: COLONOSCOPY;  Surgeon: [name], MD;  [facility];  Service: Endoscopy;  Laterality: N/A;  . LUMBAR FUSION  04/2010   W/Mechanical fixation - [place]  2013   rods in hips (to stabilize).  Marland Kitchen MASTECTOMY    . NECK SURGERY    . rheumatoid nodule removal    . SPINAL FUSION  07/2010   T10-L2 interbody fusion / [facility]  . TONSILLECTOMY    . TOTAL KNEE ARTHROPLASTY     Left    reports that she is a non-smoker but has been exposed to tobacco smoke. She has never used smokeless tobacco. She reports that she does not drink alcohol or use drugs. family history includes Arthritis in her father and mother; Brain cancer in her mother; Breast cancer in her sister; Dementia in her father; Hypertension in her father and mother; Lung cancer in her sister. Allergies  Allergen Reactions  . Hydromorphone Other (See Comments)    Cognitive changes  Review of Systems  Eyes: Negative for pain and visual disturbance.  Respiratory: Negative for shortness of breath.   Cardiovascular: Negative for chest pain.  Neurological: Positive for headaches. Negative for dizziness and syncope.       Objective:   Physical Exam  Constitutional: She appears well-developed and well-nourished.  Eyes:  Left subconjunctival hemorrhage. Right eye appears normal  Cardiovascular: Normal rate and regular rhythm.  Pulmonary/Chest: Effort normal and breath sounds normal. No respiratory distress. She has no wheezes. She has no rales.  Musculoskeletal: She exhibits no edema.       Assessment:     #1 benign appearing left  subconjunctival hemorrhage  #2 hypertension with elevated reading yesterday that improved today. Repeat on the right arm seated at rest 138/62    Plan:     -Reassurance regarding subconjunctival hemorrhage -Continue amlodipine 5 mg daily. She'll continue to monitor blood pressure at home and be in touch if consistently greater than 140/90  Eulas Post MD [facility]

## 2018-01-09 ENCOUNTER — Encounter: Payer: Medicare Other | Admitting: Physical Therapy

## 2018-01-09 ENCOUNTER — Telehealth: Payer: Self-pay | Admitting: Neurology

## 2018-01-09 NOTE — Telephone Encounter (Signed)
The patient is followed through rheumatology, Dr. Amil Amen.  Recent blood work done reveals a sedimentation rate of 22, C-reactive protein 1.7 which is normal.  This was done on 08 January 2018.  The patient is followed for rheumatoid arthritis.  She also has a history of temporal arteritis and pulmonary fibrosis.

## 2018-01-13 ENCOUNTER — Telehealth: Payer: Self-pay | Admitting: Family Medicine

## 2018-01-13 NOTE — Telephone Encounter (Signed)
I think it is OK to go ahead and start with some PT.

## 2018-01-13 NOTE — Telephone Encounter (Signed)
Please advise, pt wsa seen 01/08/18 and BP was 118/90

## 2018-01-13 NOTE — Telephone Encounter (Unsigned)
Copied from [place] [id number]. Topic: Quick Communication - See Telephone Encounter >> Jan 13, 2018  2:27 PM Percell Belt A wrote: CRM for notification. See Telephone encounter for: 01/13/18.  Pt called in and would like to know if Dr Elease Hashimoto thinks she needs to get her bp under control 1st and then do PT or is it ok to do PT now?    Best number [phone]

## 2018-01-14 ENCOUNTER — Ambulatory Visit: Payer: Medicare Other | Admitting: Physical Therapy

## 2018-01-14 NOTE — Telephone Encounter (Signed)
CRM Created

## 2018-01-14 NOTE — Telephone Encounter (Signed)
Called patient and she stated that she had cancelled her PT due to not hearing back from our office yesterday. She stated that her BP was 147/82 yesterday and has been high starting last Monday and her BP was 167/90 on 01/07/18. Patient stated that her BP has been high since she had a blood vessel break in her left eye and she has been having headaches. Patient went to her eye doctor, Dr. Kathrin Penner, and stated her eye was okay. Her BP has not come down much since this happened on 01/06/18.  Please advise.

## 2018-01-14 NOTE — Telephone Encounter (Signed)
Called patient and left a detailed voice message going over the notes from Dr. Elease Hashimoto. I did leave a call back number if patient wanted to call and make an appointment for a BP check up if she would like to be checked, OK per Dr. Elease Hashimoto.

## 2018-01-14 NOTE — Telephone Encounter (Signed)
-  We responded to note yesterday that we thought she could go back to PT -she was seen here on the 17th and BP was actually good at that time- so we were reluctant to add medication -offer her follow up if she would like to check BP again. -I think her recent sub-conjuctival hemorrhage was spontaneous and not related to BP

## 2018-01-16 ENCOUNTER — Encounter: Payer: Medicare Other | Admitting: Physical Therapy

## 2018-01-21 ENCOUNTER — Encounter: Payer: Medicare Other | Admitting: Physical Therapy

## 2018-01-22 ENCOUNTER — Encounter: Payer: Self-pay | Admitting: Family Medicine

## 2018-01-22 ENCOUNTER — Ambulatory Visit (INDEPENDENT_AMBULATORY_CARE_PROVIDER_SITE_OTHER): Payer: Medicare Other | Admitting: Family Medicine

## 2018-01-22 VITALS — BP 130/80 | HR 64 | Temp 98.1°F | Wt 146.2 lb

## 2018-01-22 DIAGNOSIS — E785 Hyperlipidemia, unspecified: Secondary | ICD-10-CM | POA: Diagnosis not present

## 2018-01-22 DIAGNOSIS — G8929 Other chronic pain: Secondary | ICD-10-CM

## 2018-01-22 DIAGNOSIS — I10 Essential (primary) hypertension: Secondary | ICD-10-CM

## 2018-01-22 DIAGNOSIS — M545 Low back pain: Secondary | ICD-10-CM | POA: Diagnosis not present

## 2018-01-22 LAB — LIPID PANEL
CHOL/HDL RATIO: 2
Cholesterol: 156 mg/dL (ref 0–200)
HDL: 69.5 mg/dL (ref >39.00)
LDL Cholesterol: 70 mg/dL (ref 0–99)
NonHDL: 86.19
TRIGLYCERIDES: 81 mg/dL (ref 0.0–149.0)
VLDL: 16.2 mg/dL (ref 0.0–40.0)

## 2018-01-22 MED ORDER — AMOXICILLIN-POT CLAVULANATE 875-125 MG PO TABS: 1.0000 | ORAL_TABLET | Freq: Two times a day (BID) | ORAL | 0 refills | Status: AC

## 2018-01-22 MED ORDER — OXYCODONE HCL ER 10 MG PO T12A: 10.0000 mg | EXTENDED_RELEASE_TABLET | Freq: Two times a day (BID) | ORAL | 0 refills | Status: DC

## 2018-01-22 MED ORDER — OXYCODONE HCL 10 MG PO TABS: 10.0000 mg | ORAL_TABLET | Freq: Four times a day (QID) | ORAL | 0 refills | Status: DC | PRN

## 2018-01-22 NOTE — Progress Notes (Signed)
Subjective:     Patient ID: [name], female   DOB: [birth year], 77 y.o.   MRN: [id number]  HPI Patient seen today to discuss the following issues  She has chronic back pain and has been on OxyContin 10 mg twice daily for several years. This controls her pain fairly well but she has had to take a little bit more frequent 10 mg oxycodone for breakthrough pain recently. No constipation or other side effects. Her current regimen has worked well for several years.  Indication for chronic opioid: Chronic back pain Medication and dose: OxyContin 10 mg twice daily and oxycodone 10 mg every 6 hours as needed for breakthrough pain  # pills per month: 60 Last UDS date: 2/19 Opioid Treatment Agreement signed (Y/N): yes.  New treatment agreement was signed today and reviewed Opioid Treatment Agreement last reviewed with patient:  today [place] reviewed this encounter (include red flags):  none  Patient has hyperlipidemia treated with low-dose Lipitor. She is due for follow-up lipids.  Hypertension. She had recent elevations but monitoring over the past couple weeks and has been very stable with readings as low as 323 systolic and as high of 557. Diastolics consistently 32K and 70s.  Past Medical History:  Diagnosis Date  . Allergic rhinitis   . Anemia   . Anxiety   . Bronchiectasis   . Carpal tunnel syndrome 07/13/2015   Bilateral  . CVA (cerebral infarction) 10/2007   Right thalamic   . Diverticulosis of colon   . Gait disorder   . GERD (gastroesophageal reflux disease)   . History of breast cancer   . HTN (hypertension)   . Hyperlipidemia   . Idiopathic pulmonary fibrosis   . Left knee DJD   . Migraine   . Osteoporosis   . Peripheral neuropathy   . PMR (polymyalgia rheumatica) (HCC)   . Polyneuropathy in other diseases classified elsewhere ([place]) 02/17/2013  . Previous back surgery 10/09/12  . Renal insufficiency   . Rheumatoid arthritis ([place])   . Seizure disorder ([place])   .  Spondylosis, cervical, with myelopathy 08/31/2015   C3-4 myelopathy  . Temporal arteritis (HCC)    Right eye blind, on steroids per Neruro/ Dr [name]  . Type II or unspecified type diabetes mellitus without mention of complication, not stated as uncontrolled    2nd to steriods  . Vitamin D deficiency    Past Surgical History:  Procedure Laterality Date  . CARPAL TUNNEL RELEASE Right   . CATARACT EXTRACTION     OS - Summer 2010  . CERVICAL FUSION  2010   4 rods and pins in place  . CHOLECYSTECTOMY    . COLONOSCOPY  12/15/2011   Procedure: COLONOSCOPY;  Surgeon: [name], MD;  [facility];  Service: Endoscopy;  Laterality: N/A;  . LUMBAR FUSION  04/2010   W/Mechanical fixation - [place]  2013   rods in hips (to stabilize).  Marland Kitchen MASTECTOMY    . NECK SURGERY    . rheumatoid nodule removal    . SPINAL FUSION  07/2010   T10-L2 interbody fusion / [facility]  . TONSILLECTOMY    . TOTAL KNEE ARTHROPLASTY     Left    reports that she is a non-smoker but has been exposed to tobacco smoke. She has never used smokeless tobacco. She reports that she does not drink alcohol or use drugs. family history includes Arthritis in her father and mother; Brain cancer in her  mother; Breast cancer in her sister; Dementia in her father; Hypertension in her father and mother; Lung cancer in her sister. Allergies  Allergen Reactions  . Hydromorphone Other (See Comments)    Cognitive changes       Review of Systems  Constitutional: Negative for fatigue.  Eyes: Negative for visual disturbance.  Respiratory: Negative for cough, chest tightness, shortness of breath and wheezing.   Cardiovascular: Negative for chest pain, palpitations and leg swelling.  Genitourinary: Negative for dysuria.  Musculoskeletal: Positive for arthralgias and back pain.  Neurological: Negative for dizziness, seizures, syncope, weakness, light-headedness and headaches.        Objective:   Physical Exam  Constitutional: She appears well-developed and well-nourished.  HENT:  Head: Normocephalic and atraumatic.  Neck: Neck supple. No thyromegaly present.  Cardiovascular: Normal rate and regular rhythm.  Pulmonary/Chest: Effort normal and breath sounds normal.  Musculoskeletal: She exhibits no edema.       Assessment:     #1 chronic back pain stable on current regimen of OxyContin 12 mg twice daily with oxycodone 10 mg for breakthrough pain  #2 hypertension stable and at goal  #3 dyslipidemia    Plan:     -Recheck lipid panel. We did not obtain hepatic as she gets comprehensive metabolic panels through rheumatology and this has been checked within the past year -Refilled her pain medications and these were sent through the computer for three-month refills -Routine follow-up in 3 months and sooner as needed  Eulas Post MD [facility]

## 2018-01-23 ENCOUNTER — Encounter: Payer: Medicare Other | Admitting: Physical Therapy

## 2018-01-24 ENCOUNTER — Telehealth: Payer: Self-pay | Admitting: Family Medicine

## 2018-01-24 MED ORDER — OXYCODONE HCL ER 10 MG PO T12A: 10.0000 mg | EXTENDED_RELEASE_TABLET | Freq: Two times a day (BID) | ORAL | 0 refills | Status: DC

## 2018-01-24 NOTE — Telephone Encounter (Signed)
Spoke with pharmacy and patient will have 2 prescriptions only to fill at [facility].  Patient will pick up printed prescription.

## 2018-01-24 NOTE — Telephone Encounter (Signed)
We had already sent in 3 rxs electronically. I suppose we could print out one for requested date- but would now have to call pharmacy to cancel one already sent in.

## 2018-01-24 NOTE — Telephone Encounter (Signed)
Copied from [place] [id number]. Topic: Quick Communication - See Telephone Encounter >> Jan 24, 2018  9:34 AM Cleaster Corin, NT wrote: CRM for notification. See Telephone encounter for: 01/24/18.  Pt. Calling to see if med oxyCODONE (OXYCONTIN) 10 mg 12 hr tablet [[id number]] could be sent to another pharmacy due to  Leaving to go to [place] on may 7th seeing if medication can be sent to [facility] on the [place], [place] - [address] [place] [address] Phone: [phone] Fax: [phone]

## 2018-01-27 ENCOUNTER — Ambulatory Visit: Payer: Medicare Other | Admitting: Family Medicine

## 2018-02-18 DIAGNOSIS — Z79899 Other long term (current) drug therapy: Secondary | ICD-10-CM | POA: Diagnosis not present

## 2018-02-18 DIAGNOSIS — M0589 Other rheumatoid arthritis with rheumatoid factor of multiple sites: Secondary | ICD-10-CM | POA: Diagnosis not present

## 2018-03-15 ENCOUNTER — Other Ambulatory Visit: Payer: Self-pay | Admitting: Neurology

## 2018-03-19 ENCOUNTER — Ambulatory Visit (INDEPENDENT_AMBULATORY_CARE_PROVIDER_SITE_OTHER): Payer: Medicare Other | Admitting: Neurology

## 2018-03-19 ENCOUNTER — Encounter: Payer: Self-pay | Admitting: Neurology

## 2018-03-19 VITALS — BP 124/72 | HR 68 | Wt 143.0 lb

## 2018-03-19 DIAGNOSIS — G63 Polyneuropathy in diseases classified elsewhere: Secondary | ICD-10-CM

## 2018-03-19 DIAGNOSIS — M316 Other giant cell arteritis: Secondary | ICD-10-CM

## 2018-03-19 DIAGNOSIS — G5603 Carpal tunnel syndrome, bilateral upper limbs: Secondary | ICD-10-CM | POA: Diagnosis not present

## 2018-03-19 NOTE — Progress Notes (Signed)
Reason for visit: Temporal arteritis, peripheral neuropathy  [name] is an 77 y.o. female  History of present illness:  [name] is a 77 year old right-handed white female with a history of temporal arteritis with significant visual impairment involving the right eye.  The patient has had stable vision in the left eye, she has rheumatoid arthritis and is followed through rheumatology.  She has carpal tunnel syndrome, she does report some achy sensations in the left shoulder at night.  Otherwise, carpal tunnel syndrome symptoms are not too bothersome for her.  She does have a peripheral neuropathy, she has lumbosacral spine disease with a left L5 radiculopathy and a left foot drop.  She has an orthosis for the left foot that she will use off and on.  She has not had any recent falls, she uses a walker for ambulation.  The patient has to get up every 2 hours at night to use the bathroom, she has difficulty sleeping because of this.  She drinks a lot of fluids at night because of a sensation of dry mouth.  She could not tolerate a lower dose than 4 mg daily of prednisone, she remains on this dose.  The patient has had recent blood work showing a normal sedimentation rate and C-reactive protein.  She returns to this office for an evaluation.  Past Medical History:  Diagnosis Date  . Allergic rhinitis   . Anemia   . Anxiety   . Bronchiectasis   . Carpal tunnel syndrome 07/13/2015   Bilateral  . CVA (cerebral infarction) 10/2007   Right thalamic   . Diverticulosis of colon   . Gait disorder   . GERD (gastroesophageal reflux disease)   . History of breast cancer   . HTN (hypertension)   . Hyperlipidemia   . Idiopathic pulmonary fibrosis   . Left knee DJD   . Migraine   . Osteoporosis   . Peripheral neuropathy   . PMR (polymyalgia rheumatica) (HCC)   . Polyneuropathy in other diseases classified elsewhere ([place]) 02/17/2013  . Previous back surgery 10/09/12  . Renal insufficiency    . Rheumatoid arthritis ([place])   . Seizure disorder ([place])   . Spondylosis, cervical, with myelopathy 08/31/2015   C3-4 myelopathy  . Temporal arteritis (HCC)    Right eye blind, on steroids per [facility]er[facility]/ Dr [name]  . Type II or unspecified type diabetes mellitus without mention of complication, not stated as uncontrolled    2nd to steriods  . Vitamin D deficiency     Past Surgical History:  Procedure Laterality Date  . CARPAL TUNNEL RELEASE Right   . CATARACT EXTRACTION     OS - Summer 2010  . CERVICAL FUSION  2010   4 rods and pins in place  . CHOLECYSTECTOMY    . COLONOSCOPY  12/15/2011   Procedure: COLONOSCOPY;  Surgeon: [name], MD;  Location: [facility];  Service: Endoscopy;  Laterality: N/A;  . LUMBAR FUSION  04/2010   W/Mechanical fixation - [place]  2013   rods in hips (to stabilize).  Marland Kitchen MASTECTOMY    . NECK SURGERY    . rheumatoid nodule removal    . SPINAL FUSION  07/2010   T10-L2 interbody fusion / [facility]  . TONSILLECTOMY    . TOTAL KNEE ARTHROPLASTY     Left    Family History  Problem Relation Age of Onset  . Brain cancer Mother   . Hypertension Mother   .  Arthritis Mother   . Dementia Father   . Hypertension Father   . Arthritis Father   . Breast cancer Sister   . Lung cancer Sister   . Lung disease Neg Hx   . Rheumatologic disease Neg Hx     Social history:  reports that she is a non-smoker but has been exposed to tobacco smoke. She has never used smokeless tobacco. She reports that she does not drink alcohol or use drugs.    Allergies  Allergen Reactions  . Hydromorphone Other (See Comments)    Cognitive changes     Medications:  Prior to Admission medications   Medication Sig Start Date End Date Taking? Authorizing Provider  amLODipine (NORVASC) 5 MG tablet TAKE 1 TABLET DAILY 10/09/17  Yes Burchette, Alinda Sierras, MD  Calcium Carb-Cholecalciferol (CALCIUM CARBONATE-VITAMIN D3 PO) Take 1 tablet by mouth  daily   Yes [provider]  cholecalciferol (VITAMIN D) 1000 UNITS tablet Take 1,000 Units by mouth daily.     Yes [provider]  denosumab (PROLIA) 60 MG/ML SOLN Inject 60 mg into the skin every 6 (six) months.     Yes [provider]  diclofenac sodium (VOLTAREN) 1 % GEL Apply 2 g topically 4 (four) times daily. 09/04/16  Yes Kathrynn Ducking, MD  ferrous sulfate 325 (65 FE) MG tablet Take 325 mg by mouth 2 (two) times daily.    Yes [provider]  furosemide (LASIX) 40 MG tablet TAKE 1 TABLET DAILY 08/05/17  Yes Burchette, Alinda Sierras, MD  gabapentin (NEURONTIN) 300 MG capsule Take 2 capsules (600 mg total) by mouth 2 (two) times daily. 03/12/17  Yes Kathrynn Ducking, MD  Golimumab [facility] ARIA IV) Inject into the vein. Takes infusion every 8 wks.   Yes [provider]  KLOR-CON M10 10 MEQ tablet TAKE 1 TABLET TWICE A DAY Patient taking differently: TAKE 1 TABLET daily 08/27/16  Yes Burchette, Alinda Sierras, MD  Multiple Vitamins-Minerals (MULTIVITAMIN,TX-MINERALS) tablet Take 1 tablet by mouth daily.     Yes [provider]  oxyCODONE (OXYCONTIN) 10 mg 12 hr tablet Take 1 tablet (10 mg total) by mouth every 12 (twelve) hours. 01/24/18  Yes Burchette, Alinda Sierras, MD  Oxycodone HCl 10 MG TABS Take 1 tablet (10 mg total) by mouth every 6 (six) hours as needed. 01/22/18  Yes Burchette, Alinda Sierras, MD  predniSONE (DELTASONE) 1 MG tablet Take 4 tablets (4 mg total) by mouth daily with breakfast. 08/21/17  Yes Kathrynn Ducking, MD  simvastatin (ZOCOR) 5 MG tablet TAKE 1 TABLET AT BEDTIME 01/08/18  Yes Burchette, Alinda Sierras, MD  potassium chloride (K-DUR) 10 MEQ tablet Take 1 tablet (10 mEq total) by mouth 2 (two) times daily. 12/29/12 03/02/13  Norins, Heinz Knuckles, MD    ROS:  Out of a complete 14 system review of symptoms, the patient complains only of the following symptoms, and all other reviewed systems are negative.  Frequency of urination  Blood pressure  124/72, pulse 68, weight 143 lb (64.9 kg).  Physical Exam  General: The patient is alert and cooperative at the time of the examination.  Skin: 2+ edema below the knees is seen bilaterally.   Neurologic Exam  Mental status: The patient is alert and oriented x 3 at the time of the examination. The patient has apparent normal recent and remote memory, with an apparently normal attention span and concentration ability.   Cranial nerves: Facial symmetry is present. Speech is normal, no aphasia  or dysarthria is noted. Extraocular movements are full. Visual fields are full.  Motor: The patient has good strength in all 4 extremities.  Sensory examination: Soft touch sensation is symmetric on the face, arms, and legs.  Coordination: The patient has good finger-nose-finger and heel-to-shin bilaterally.  Gait and station: The patient has a slightly wide-based gait.  The patient cannot perform tandem gait.  Romberg is negative.  The patient normally uses a walker for ambulation.  Reflexes: Deep tendon reflexes are symmetric, ankle jerk reflexes are depressed bilaterally, otherwise reflexes are if anything slightly brisk.   Assessment/Plan:  1.  Temporal arteritis  2.  Peripheral neuropathy  3.  Carpal tunnel syndrome  4.  Gait disorder  5.  Rheumatoid arthritis  The patient is doing relatively well at this point.  She will remain on the 4 mg dose of the prednisone.  She will follow-up through this office in 6 months.  She has had recent blood work done.  Jill Alexanders MD 03/19/2018 10:33 AM  [facility] [address] [place], [place] [address]  Phone [phone] Fax [phone]

## 2018-04-08 ENCOUNTER — Other Ambulatory Visit: Payer: Self-pay | Admitting: Family Medicine

## 2018-04-09 ENCOUNTER — Ambulatory Visit (INDEPENDENT_AMBULATORY_CARE_PROVIDER_SITE_OTHER): Payer: Medicare Other | Admitting: Family Medicine

## 2018-04-09 ENCOUNTER — Encounter: Payer: Self-pay | Admitting: Family Medicine

## 2018-04-09 VITALS — BP 110/70 | HR 75 | Temp 98.4°F | Wt 146.7 lb

## 2018-04-09 DIAGNOSIS — E785 Hyperlipidemia, unspecified: Secondary | ICD-10-CM | POA: Diagnosis not present

## 2018-04-09 DIAGNOSIS — I10 Essential (primary) hypertension: Secondary | ICD-10-CM

## 2018-04-09 DIAGNOSIS — M316 Other giant cell arteritis: Secondary | ICD-10-CM | POA: Diagnosis not present

## 2018-04-09 DIAGNOSIS — M542 Cervicalgia: Secondary | ICD-10-CM

## 2018-04-09 MED ORDER — OXYCODONE HCL ER 10 MG PO T12A: 10.0000 mg | EXTENDED_RELEASE_TABLET | Freq: Two times a day (BID) | ORAL | 0 refills | Status: DC

## 2018-04-09 NOTE — Progress Notes (Signed)
Subjective:     Patient ID: [name], female   DOB: [birth year], 77 y.o.   MRN: [id number]  HPI Patient seen for medical follow-up. Chronic problems include hypertension, temporal arteritis, history of pulmonary fibrosis, polyneuropathy, cervical spondylosis, osteoporosis, hyperlipidemia, history of breast cancer, chronic back pain  On regimen of OxyContin 10 mg twice daily for several years. Supplements occasionally with oxycodone. Pain relatively well controlled. No constipation or other side effects. Needs refills of OxyContin at this time. She had drug screen back in the winter which was normal.  Remains on low-dose simvastatin for hyperlipidemia. She takes amlodipine for hypertension. No recent dizziness. No chest pains. No headaches. Lipids were checked in May and stable.  Past Medical History:  Diagnosis Date  . Allergic rhinitis   . Anemia   . Anxiety   . Bronchiectasis   . Carpal tunnel syndrome 07/13/2015   Bilateral  . CVA (cerebral infarction) 10/2007   Right thalamic   . Diverticulosis of colon   . Gait disorder   . GERD (gastroesophageal reflux disease)   . History of breast cancer   . HTN (hypertension)   . Hyperlipidemia   . Idiopathic pulmonary fibrosis   . Left knee DJD   . Migraine   . Osteoporosis   . Peripheral neuropathy   . PMR (polymyalgia rheumatica) (HCC)   . Polyneuropathy in other diseases classified elsewhere ([place]) 02/17/2013  . Previous back surgery 10/09/12  . Renal insufficiency   . Rheumatoid arthritis ([place])   . Seizure disorder ([place])   . Spondylosis, cervical, with myelopathy 08/31/2015   C3-4 myelopathy  . Temporal arteritis (HCC)    Right eye blind, on steroids per [facility]/ Dr [name]  . Type II or unspecified type diabetes mellitus without mention of complication, not stated as uncontrolled    2nd to steriods  . Vitamin D deficiency    Past Surgical History:  Procedure Laterality Date  . CARPAL TUNNEL RELEASE Right   . CATARACT  EXTRACTION     OS - Summer 2010  . CERVICAL FUSION  2010   4 rods and pins in place  . CHOLECYSTECTOMY    . COLONOSCOPY  12/15/2011   Procedure: COLONOSCOPY;  Surgeon: [name], MD;  [facility];  Service: Endoscopy;  Laterality: N/A;  . LUMBAR FUSION  04/2010   W/Mechanical fixation - [place]  2013   rods in hips (to stabilize).  Marland Kitchen MASTECTOMY    . NECK SURGERY    . rheumatoid nodule removal    . SPINAL FUSION  07/2010   T10-L2 interbody fusion / [facility]  . TONSILLECTOMY    . TOTAL KNEE ARTHROPLASTY     Left    reports that she is a non-smoker but has been exposed to tobacco smoke. She has never used smokeless tobacco. She reports that she does not drink alcohol or use drugs. family history includes Arthritis in her father and mother; Brain cancer in her mother; Breast cancer in her sister; Dementia in her father; Hypertension in her father and mother; Lung cancer in her sister. Allergies  Allergen Reactions  . Hydromorphone Other (See Comments)    Cognitive changes      Review of Systems  Constitutional: Negative for fatigue and unexpected weight change.  Eyes: Negative for visual disturbance.  Respiratory: Negative for cough, chest tightness, shortness of breath and wheezing.   Cardiovascular: Negative for chest pain, palpitations and leg swelling.  Endocrine: Negative for polydipsia  and polyuria.  Musculoskeletal: Positive for neck pain.  Neurological: Negative for dizziness, seizures, syncope, weakness, light-headedness and headaches.       Objective:   Physical Exam  Constitutional: She appears well-developed and well-nourished.  Eyes: Pupils are equal, round, and reactive to light.  Neck: Neck supple. No JVD present. No thyromegaly present.  Cardiovascular: Normal rate and regular rhythm. Exam reveals no gallop.  Pulmonary/Chest: Effort normal and breath sounds normal. No respiratory distress. She has no wheezes.  She has no rales.  Musculoskeletal: She exhibits no edema.  Neurological: She is alert.       Assessment:     #1 chronic cervical neck pain stable on OxyContin 10 mg twice daily  #2 hypertension stable and at goal  #3 dyslipidemia  #4 temporal arteritis on low-dose prednisone followed by neurology    Plan:     -Refill OxyContin for 3 months -Recent labs reviewed with patient -Routine follow-up in 3 months and sooner as needed -need to consider repeat DEXA with chronic prednisone use and past hx of osteopenia.  Discuss at follow up.  Eulas Post MD [facility] at [facility]

## 2018-04-14 ENCOUNTER — Ambulatory Visit: Payer: Medicare Other | Admitting: Family Medicine

## 2018-04-15 DIAGNOSIS — M0589 Other rheumatoid arthritis with rheumatoid factor of multiple sites: Secondary | ICD-10-CM | POA: Diagnosis not present

## 2018-04-16 DIAGNOSIS — Z961 Presence of intraocular lens: Secondary | ICD-10-CM | POA: Diagnosis not present

## 2018-04-16 DIAGNOSIS — H52202 Unspecified astigmatism, left eye: Secondary | ICD-10-CM | POA: Diagnosis not present

## 2018-04-16 DIAGNOSIS — H16103 Unspecified superficial keratitis, bilateral: Secondary | ICD-10-CM | POA: Diagnosis not present

## 2018-04-16 DIAGNOSIS — H4720 Unspecified optic atrophy: Secondary | ICD-10-CM | POA: Diagnosis not present

## 2018-04-18 ENCOUNTER — Telehealth: Payer: Self-pay | Admitting: Neurology

## 2018-04-18 MED ORDER — DICLOFENAC SODIUM 1 % TD GEL: 2.0000 g | Freq: Four times a day (QID) | TRANSDERMAL | 3 refills | Status: DC

## 2018-04-18 NOTE — Telephone Encounter (Signed)
Pts called stating she is needing a new Rx for diclofenac sodium (VOLTAREN) 1 % GEL sent to [facility]

## 2018-04-18 NOTE — Telephone Encounter (Signed)
Refill for diclofenac gel sent to [facility].

## 2018-05-13 DIAGNOSIS — M316 Other giant cell arteritis: Secondary | ICD-10-CM | POA: Diagnosis not present

## 2018-05-13 DIAGNOSIS — M810 Age-related osteoporosis without current pathological fracture: Secondary | ICD-10-CM | POA: Diagnosis not present

## 2018-05-13 DIAGNOSIS — M150 Primary generalized (osteo)arthritis: Secondary | ICD-10-CM | POA: Diagnosis not present

## 2018-05-13 DIAGNOSIS — Z6826 Body mass index (BMI) 26.0-26.9, adult: Secondary | ICD-10-CM | POA: Diagnosis not present

## 2018-05-13 DIAGNOSIS — M79672 Pain in left foot: Secondary | ICD-10-CM | POA: Diagnosis not present

## 2018-05-13 DIAGNOSIS — M0589 Other rheumatoid arthritis with rheumatoid factor of multiple sites: Secondary | ICD-10-CM | POA: Diagnosis not present

## 2018-05-13 DIAGNOSIS — M79645 Pain in left finger(s): Secondary | ICD-10-CM | POA: Diagnosis not present

## 2018-05-13 DIAGNOSIS — E663 Overweight: Secondary | ICD-10-CM | POA: Diagnosis not present

## 2018-05-13 DIAGNOSIS — J8410 Pulmonary fibrosis, unspecified: Secondary | ICD-10-CM | POA: Diagnosis not present

## 2018-05-13 DIAGNOSIS — N184 Chronic kidney disease, stage 4 (severe): Secondary | ICD-10-CM | POA: Diagnosis not present

## 2018-05-13 DIAGNOSIS — M5136 Other intervertebral disc degeneration, lumbar region: Secondary | ICD-10-CM | POA: Diagnosis not present

## 2018-05-18 ENCOUNTER — Other Ambulatory Visit: Payer: Self-pay | Admitting: Neurology

## 2018-06-10 DIAGNOSIS — Z79899 Other long term (current) drug therapy: Secondary | ICD-10-CM | POA: Diagnosis not present

## 2018-06-10 DIAGNOSIS — M0589 Other rheumatoid arthritis with rheumatoid factor of multiple sites: Secondary | ICD-10-CM | POA: Diagnosis not present

## 2018-06-11 ENCOUNTER — Telehealth: Payer: Self-pay | Admitting: Neurology

## 2018-06-11 NOTE — Telephone Encounter (Signed)
Blood work done by Dr. Amil Amen on 10 June 2018.  White blood count 8.2, hemoglobin 13.1, hematocrit 38.9, MCV of 92, platelets of 229.  Sedimentation rate is 22, upper limits for lab 40.  C-reactive protein of 9, glucose 102, BUN of 18 creatinine of 1.19, estimated GFR of 44.  Sodium 145, potassium 3.5, chloride 98, CO2 31, calcium 9.7 total protein 6.4, albumin 3.9, liver profile is unremarkable.

## 2018-06-13 ENCOUNTER — Other Ambulatory Visit: Payer: Self-pay | Admitting: Neurology

## 2018-06-25 DIAGNOSIS — R600 Localized edema: Secondary | ICD-10-CM | POA: Diagnosis not present

## 2018-06-25 DIAGNOSIS — R739 Hyperglycemia, unspecified: Secondary | ICD-10-CM | POA: Diagnosis not present

## 2018-06-25 DIAGNOSIS — M069 Rheumatoid arthritis, unspecified: Secondary | ICD-10-CM | POA: Diagnosis not present

## 2018-06-25 DIAGNOSIS — N183 Chronic kidney disease, stage 3 (moderate): Secondary | ICD-10-CM | POA: Diagnosis not present

## 2018-06-25 DIAGNOSIS — M4802 Spinal stenosis, cervical region: Secondary | ICD-10-CM | POA: Diagnosis not present

## 2018-06-25 DIAGNOSIS — E669 Obesity, unspecified: Secondary | ICD-10-CM | POA: Diagnosis not present

## 2018-06-25 DIAGNOSIS — J479 Bronchiectasis, uncomplicated: Secondary | ICD-10-CM | POA: Diagnosis not present

## 2018-06-25 DIAGNOSIS — J8410 Pulmonary fibrosis, unspecified: Secondary | ICD-10-CM | POA: Diagnosis not present

## 2018-06-25 DIAGNOSIS — N179 Acute kidney failure, unspecified: Secondary | ICD-10-CM | POA: Diagnosis not present

## 2018-06-25 DIAGNOSIS — I129 Hypertensive chronic kidney disease with stage 1 through stage 4 chronic kidney disease, or unspecified chronic kidney disease: Secondary | ICD-10-CM | POA: Diagnosis not present

## 2018-06-25 LAB — BASIC METABOLIC PANEL
BUN: 17 (ref 4–21)
Creatinine: 1.2 — AB (ref <=1.1)
GLUCOSE: 92
Potassium: 3.5 (ref 3.4–5.3)
SODIUM: 143 (ref 137–147)

## 2018-06-25 LAB — HEPATIC FUNCTION PANEL
ALT: 20 (ref 7–35)
AST: 26 (ref 13–35)
Alkaline Phosphatase: 82 (ref 25–125)
Bilirubin, Total: 0.7

## 2018-06-25 LAB — CBC AND DIFFERENTIAL
HEMATOCRIT: 39 (ref 36–46)
Hemoglobin: 13 (ref 12.0–16.0)
Neutrophils Absolute: 5
Platelets: 226 (ref 150–399)
WBC: 7.4

## 2018-06-26 DIAGNOSIS — M810 Age-related osteoporosis without current pathological fracture: Secondary | ICD-10-CM | POA: Diagnosis not present

## 2018-07-02 ENCOUNTER — Encounter: Payer: Self-pay | Admitting: Family Medicine

## 2018-07-02 ENCOUNTER — Ambulatory Visit (INDEPENDENT_AMBULATORY_CARE_PROVIDER_SITE_OTHER): Payer: Medicare Other | Admitting: Family Medicine

## 2018-07-02 ENCOUNTER — Other Ambulatory Visit: Payer: Self-pay

## 2018-07-02 VITALS — BP 110/68 | HR 65 | Temp 98.3°F | Ht 60.5 in | Wt 140.8 lb

## 2018-07-02 DIAGNOSIS — I10 Essential (primary) hypertension: Secondary | ICD-10-CM

## 2018-07-02 DIAGNOSIS — E785 Hyperlipidemia, unspecified: Secondary | ICD-10-CM | POA: Diagnosis not present

## 2018-07-02 DIAGNOSIS — N182 Chronic kidney disease, stage 2 (mild): Secondary | ICD-10-CM

## 2018-07-02 DIAGNOSIS — M545 Low back pain, unspecified: Secondary | ICD-10-CM

## 2018-07-02 DIAGNOSIS — G8929 Other chronic pain: Secondary | ICD-10-CM | POA: Diagnosis not present

## 2018-07-02 DIAGNOSIS — Z23 Encounter for immunization: Secondary | ICD-10-CM

## 2018-07-02 MED ORDER — OXYCODONE HCL ER 10 MG PO T12A: 10.0000 mg | EXTENDED_RELEASE_TABLET | Freq: Two times a day (BID) | ORAL | 0 refills | Status: DC

## 2018-07-02 NOTE — Progress Notes (Signed)
Subjective:     Patient ID: [name], female   DOB: Feb 04, 1941, 77 y.o.   MRN: [id number]  HPI Patient seen for medical follow-up.  She has chronic low back pain predominantly and has been on OxyContin 10 mg every 12 hours for many years.  She came to [place] on that.  Her pain is fairly well-controlled.  She states she has 5 out of 10 pain most days but is very functional with that.  She rarely takes supplement with oxycodone 10 mg.  She does have some mild chronic constipation but no significant changes.  No other side effects.  She had drug screen back in February which was no concerns.  Has had some recent pains in her right lower lumbar area.  She thinks this is related to lifting something at the beach recently.  No fall.  No pelvic pain otherwise.  No radiculitis symptoms.  She has chronic kidney disease followed by nephrology.  She had recent labs per rheumatology and these were reviewed.  Kidney function was stable with GFR 44 and creatinine 1.19.  She has osteoporosis and gets Prolia injections every 6 months.  Hyperlipidemia treated with low-dose simvastatin.  Her lipids were checked back in May and very stable.  She denies any recent chest pains.  No dyspnea.  Still needs flu vaccine  Past Medical History:  Diagnosis Date  . Allergic rhinitis   . Anemia   . Anxiety   . Bronchiectasis   . Carpal tunnel syndrome 07/13/2015   Bilateral  . CVA (cerebral infarction) 10/2007   Right thalamic   . Diverticulosis of colon   . Gait disorder   . GERD (gastroesophageal reflux disease)   . History of breast cancer   . HTN (hypertension)   . Hyperlipidemia   . Idiopathic pulmonary fibrosis   . Left knee DJD   . Migraine   . Osteoporosis   . Peripheral neuropathy   . PMR (polymyalgia rheumatica) (HCC)   . Polyneuropathy in other diseases classified elsewhere ([place]) 02/17/2013  . Previous back surgery 10/09/12  . Renal insufficiency   . Rheumatoid arthritis ([place])   . Seizure disorder  ([place])   . Spondylosis, cervical, with myelopathy 08/31/2015   C3-4 myelopathy  . Temporal arteritis (HCC)    Right eye blind, on steroids per [facility]/ Dr [name]  . Type II or unspecified type diabetes mellitus without mention of complication, not stated as uncontrolled    2nd to steriods  . Vitamin D deficiency    Past Surgical History:  Procedure Laterality Date  . CARPAL TUNNEL RELEASE Right   . CATARACT EXTRACTION     OS - Summer 2010  . CERVICAL FUSION  2010   4 rods and pins in place  . CHOLECYSTECTOMY    . COLONOSCOPY  12/15/2011   Procedure: COLONOSCOPY;  Surgeon: [name], MD;  [facility];  Service: Endoscopy;  Laterality: N/A;  . LUMBAR FUSION  04/2010   W/Mechanical fixation - [place]  2013   rods in hips (to stabilize).  Marland Kitchen MASTECTOMY    . NECK SURGERY    . rheumatoid nodule removal    . SPINAL FUSION  07/2010   T10-L2 interbody fusion / [facility]  . TONSILLECTOMY    . TOTAL KNEE ARTHROPLASTY     Left    reports that she is a non-smoker but has been exposed to tobacco smoke. She has never used smokeless tobacco. She reports that  she does not drink alcohol or use drugs. family history includes Arthritis in her father and mother; Brain cancer in her mother; Breast cancer in her sister; Dementia in her father; Hypertension in her father and mother; Lung cancer in her sister. Allergies  Allergen Reactions  . Hydromorphone Other (See Comments)    Cognitive changes      Review of Systems  Constitutional: Negative for fatigue.  Eyes: Negative for visual disturbance.  Respiratory: Negative for cough, chest tightness, shortness of breath and wheezing.   Cardiovascular: Negative for chest pain, palpitations and leg swelling.  Gastrointestinal: Negative for abdominal pain.  Genitourinary: Negative for dysuria.  Musculoskeletal: Positive for arthralgias, back pain and neck pain.  Neurological: Negative for dizziness,  seizures, syncope, weakness, light-headedness and headaches.       Objective:   Physical Exam  Constitutional: She appears well-developed and well-nourished.  Eyes: Pupils are equal, round, and reactive to light.  Neck: Neck supple. No JVD present. No thyromegaly present.  Cardiovascular: Normal rate and regular rhythm. Exam reveals no gallop.  Pulmonary/Chest: Effort normal and breath sounds normal. No respiratory distress. She has no wheezes. She has no rales.  Musculoskeletal: She exhibits no edema.  Neurological: She is alert.  Skin: No rash noted.       Assessment:     #1 chronic low back pain overall stable on current pain medication regimen  #2 hypertension stable and at goal  #3 dyslipidemia.  We reviewed her recent labs from May and these were stable.  #4 history of chronic kidney disease followed by nephrology    Plan:     -Flu vaccine given -Refilled her OxyContin for 3 months.  We will plan to recheck urine drug screen at follow-up in about 3 months -No medication changes were made today  Eulas Post MD [facility] at [facility]

## 2018-07-04 ENCOUNTER — Ambulatory Visit: Payer: Medicare Other | Admitting: Family Medicine

## 2018-07-06 ENCOUNTER — Other Ambulatory Visit: Payer: Self-pay | Admitting: Family Medicine

## 2018-07-16 ENCOUNTER — Ambulatory Visit (INDEPENDENT_AMBULATORY_CARE_PROVIDER_SITE_OTHER): Payer: Medicare Other | Admitting: Family Medicine

## 2018-07-16 ENCOUNTER — Encounter: Payer: Self-pay | Admitting: Family Medicine

## 2018-07-16 ENCOUNTER — Other Ambulatory Visit: Payer: Self-pay

## 2018-07-16 ENCOUNTER — Encounter

## 2018-07-16 ENCOUNTER — Ambulatory Visit (INDEPENDENT_AMBULATORY_CARE_PROVIDER_SITE_OTHER): Payer: Medicare Other

## 2018-07-16 VITALS — BP 114/70 | HR 72 | Temp 98.5°F | Ht 60.5 in | Wt 140.8 lb

## 2018-07-16 DIAGNOSIS — M25551 Pain in right hip: Secondary | ICD-10-CM

## 2018-07-16 DIAGNOSIS — S79911A Unspecified injury of right hip, initial encounter: Secondary | ICD-10-CM | POA: Diagnosis not present

## 2018-07-16 NOTE — Patient Instructions (Signed)
We will call you after x-ray over read.

## 2018-07-16 NOTE — Progress Notes (Signed)
Subjective:     Patient ID: [name], female   DOB: [birth year], 77 y.o.   MRN: [id number]  HPI Patient seen with some continued vague pains really almost more in her right buttock region for about 3 weeks now.  She has chronic back pain but this pain is somewhat different.  She denies any groin or anterior hip pain.  No recent injury.  She has some pain when she first gets up to bear weight.  Denies any recent fall.  She has not noted any increased swelling.  Pain is really more in the lower buttock region than the sacroiliac region.  No alleviating factors. She does not describe any typical sciatica type symptoms.  Denies any lower extremity numbness or weakness.  Past Medical History:  Diagnosis Date  . Allergic rhinitis   . Anemia   . Anxiety   . Bronchiectasis   . Carpal tunnel syndrome 07/13/2015   Bilateral  . CVA (cerebral infarction) 10/2007   Right thalamic   . Diverticulosis of colon   . Gait disorder   . GERD (gastroesophageal reflux disease)   . History of breast cancer   . HTN (hypertension)   . Hyperlipidemia   . Idiopathic pulmonary fibrosis   . Left knee DJD   . Migraine   . Osteoporosis   . Peripheral neuropathy   . PMR (polymyalgia rheumatica) (HCC)   . Polyneuropathy in other diseases classified elsewhere ([place]) 02/17/2013  . Previous back surgery 10/09/12  . Renal insufficiency   . Rheumatoid arthritis ([place])   . Seizure disorder ([place])   . Spondylosis, cervical, with myelopathy 08/31/2015   C3-4 myelopathy  . Temporal arteritis (HCC)    Right eye blind, on steroids per [facility]er[facility]/ Dr [name]  . Type II or unspecified type diabetes mellitus without mention of complication, not stated as uncontrolled    2nd to steriods  . Vitamin D deficiency    Past Surgical History:  Procedure Laterality Date  . CARPAL TUNNEL RELEASE Right   . CATARACT EXTRACTION     OS - Summer 2010  . CERVICAL FUSION  2010   4 rods and pins in place  . CHOLECYSTECTOMY    .  COLONOSCOPY  12/15/2011   Procedure: COLONOSCOPY;  Surgeon: [name], MD;  [facility];  Service: Endoscopy;  Laterality: N/A;  . LUMBAR FUSION  04/2010   W/Mechanical fixation - [place]  2013   rods in hips (to stabilize).  Marland Kitchen MASTECTOMY    . NECK SURGERY    . rheumatoid nodule removal    . SPINAL FUSION  07/2010   T10-L2 interbody fusion / [facility]  . TONSILLECTOMY    . TOTAL KNEE ARTHROPLASTY     Left    reports that she is a non-smoker but has been exposed to tobacco smoke. She has never used smokeless tobacco. She reports that she does not drink alcohol or use drugs. family history includes Arthritis in her father and mother; Brain cancer in her mother; Breast cancer in her sister; Dementia in her father; Hypertension in her father and mother; Lung cancer in her sister. Allergies  Allergen Reactions  . Hydromorphone Other (See Comments)    Cognitive changes      Review of Systems  Constitutional: Negative for chills and fever.  Musculoskeletal: Positive for back pain.  Neurological: Negative for weakness and numbness.       Objective:   Physical Exam  Constitutional: She appears  well-developed and well-nourished.  Cardiovascular: Normal rate and regular rhythm.  Pulmonary/Chest: Effort normal and breath sounds normal.  Musculoskeletal: She exhibits no edema.  Right hip reveals no tenderness over the lateral bursa region.  She has good range of motion with internal and external rotation.  No sacroiliac tenderness.       Assessment:     Right buttock/hip pain.  No evidence for trochanteric bursitis.  Doubt intra-articular.  Doubt sciatica.  Location not over SI joint.    Plan:     -Obtain x-rays right hip for further evaluation- she does have some mild to moderate degenerative changes of both hips but no obvious acute bony abnormality.- to be over-read. -continue with her current/chronic pain medications. - follow  up for any weakness or progressive pain.  Eulas Post MD [facility] at [facility]

## 2018-07-21 ENCOUNTER — Other Ambulatory Visit: Payer: Self-pay | Admitting: Family Medicine

## 2018-07-21 NOTE — Telephone Encounter (Signed)
Copied from [place] [id number]. Topic: Quick Communication - Rx Refill/Question >> Jul 21, 2018 11:29 AM Reyne Dumas L wrote: Medication: oxyCODONE (OXYCONTIN) 10 mg 12 hr tablet  Has the patient contacted their pharmacy? No - states provider told her to call when she was out.  Pt states she has no pills left. (Agent: If no, request that the patient contact the pharmacy for the refill.) (Agent: If yes, when and what did the pharmacy advise?)  Preferred Pharmacy (with phone number or street name): [facility] [place], [address] AT [facility] [address] [place] [phone] (Phone) [phone] (Fax)  Agent: Please be advised that RX refills may take up to 3 business days. We ask that you follow-up with your pharmacy.

## 2018-07-22 NOTE — Telephone Encounter (Signed)
Requested medication (s) are due for refill today: yes  Requested medication (s) are on the active medication list: yes  Last refill:  07/02/18  Future visit scheduled: no  Notes to clinic:  Undelegated; 3 prescription requests for the same medication    Requested Prescriptions  Pending Prescriptions Disp Refills   oxyCODONE (OXYCONTIN) 10 mg 12 hr tablet 60 tablet 0    Sig: Take 1 tablet (10 mg total) by mouth every 12 (twelve) hours. Fill in two months     Not Delegated - Analgesics:  Opioid Agonists Failed - 07/21/2018  2:32 PM      Failed - This refill cannot be delegated      Failed - Urine Drug Screen completed in last 360 days.      Passed - Valid encounter within last 6 months    Recent Outpatient Visits          6 days ago Right hip pain   [place] at [facility], Alinda Sierras, MD   2 weeks ago Chronic bilateral low back pain without sciatica   [facility] [place], [place]   3 months ago Essential hypertension   [facility], Alinda Sierras, MD   6 months ago Essential hypertension   [facility], Alinda Sierras, MD   6 months ago Essential hypertension   [facility], Alinda Sierras, MD            oxyCODONE (OXYCONTIN) 10 mg 12 hr tablet 60 tablet 0    Sig: Take 1 tablet (10 mg total) by mouth every 12 (twelve) hours. Fill in one month     Not Delegated - Analgesics:  Opioid Agonists Failed - 07/21/2018  2:32 PM      Failed - This refill cannot be delegated      Failed - Urine Drug Screen completed in last 360 days.      Passed - Valid encounter within last 6 months    Recent Outpatient Visits          6 days ago Right hip pain   [place] at [facility], Alinda Sierras, MD   2 weeks ago Chronic bilateral low back pain without sciatica   [facility] [place], [place]   3 months ago Essential hypertension   [facility], Alinda Sierras, MD   6 months ago Essential hypertension   [facility], Alinda Sierras, MD   6 months ago Essential hypertension   [facility], Alinda Sierras, MD            oxyCODONE (OXYCONTIN) 10 mg 12 hr tablet 60 tablet 0    Sig: Take 1 tablet (10 mg total) by mouth every 12 (twelve) hours.     Not Delegated - Analgesics:  Opioid Agonists Failed - 07/21/2018  2:32 PM      Failed - This refill cannot be delegated      Failed - Urine Drug Screen completed in last 360 days.      Passed - Valid encounter within last 6 months    Recent Outpatient Visits          6 days ago Right hip pain   [place] [facility] [place], [place]   2 weeks ago Chronic bilateral low back pain without sciatica   [facility], Alinda Sierras, MD   3 months ago  Essential hypertension   [facility], Alinda Sierras, MD   6 months ago Essential hypertension   [facility], Alinda Sierras, MD   6 months ago Essential hypertension   [facility], Alinda Sierras, MD

## 2018-07-22 NOTE — Telephone Encounter (Signed)
This prescription has refills. Not sure why it is coming to [place] again.

## 2018-07-23 NOTE — Telephone Encounter (Signed)
Wrong medication placed in encounter by [facility] NT.   Pt is asking for Oxycodone

## 2018-07-23 NOTE — Telephone Encounter (Signed)
Pt states she needs the Oxycodone HCl 10 MG TABS that  is for her break through pain.  Pt has refills on the other, but states she needs asap, pt having severe back pain.  [facility] [place] AT [facility] [address] [phone] (Phone) [phone] (Fax)

## 2018-07-23 NOTE — Telephone Encounter (Signed)
Message cut off -  Pt asking for Oxycodone for break-through pain, not the 12hr tabs.

## 2018-07-23 NOTE — Telephone Encounter (Signed)
Please see message from [place].

## 2018-07-24 ENCOUNTER — Other Ambulatory Visit: Payer: Self-pay | Admitting: *Deleted

## 2018-07-24 NOTE — Telephone Encounter (Signed)
Copied from [place] [id number]. Topic: General - Other >> Jul 24, 2018  3:23 PM Janace Aris A wrote: Reason for CRM: spoke with pt, she wants [place] to make sure Dr. Elease Hashimoto can fill her RX order when he comes in tomorrow, says she doesn't want to wait till the afternoon to receive it.  I advised pt that I can send this message over on her behalf.

## 2018-07-25 MED ORDER — OXYCODONE HCL 10 MG PO TABS: 10.0000 mg | ORAL_TABLET | Freq: Four times a day (QID) | ORAL | 0 refills | Status: DC | PRN

## 2018-07-25 NOTE — Telephone Encounter (Signed)
sent 

## 2018-07-25 NOTE — Telephone Encounter (Signed)
Patient is requesting Oxycodone for break through pain.  Last filled 01/22/18, # 90 with 0 refills

## 2018-07-30 ENCOUNTER — Other Ambulatory Visit: Payer: Self-pay | Admitting: Family Medicine

## 2018-08-05 DIAGNOSIS — M0589 Other rheumatoid arthritis with rheumatoid factor of multiple sites: Secondary | ICD-10-CM | POA: Diagnosis not present

## 2018-08-18 ENCOUNTER — Telehealth: Payer: Self-pay | Admitting: *Deleted

## 2018-08-18 NOTE — Telephone Encounter (Signed)
Prior auth for Oxycontin 10mg  ER sent to [email or web address]-key [id number].  Message in Covermymeds stated: Drug is covered by current benefit plan. No further PA activity needed.  I called the pharmacy and informed Ludwig Clarks of this and he stated the pt has picked up the Rx.

## 2018-09-08 ENCOUNTER — Other Ambulatory Visit: Payer: Self-pay

## 2018-09-08 ENCOUNTER — Encounter: Payer: Self-pay | Admitting: Family Medicine

## 2018-09-08 ENCOUNTER — Ambulatory Visit: Payer: Medicare Other | Admitting: Family Medicine

## 2018-09-08 ENCOUNTER — Ambulatory Visit (INDEPENDENT_AMBULATORY_CARE_PROVIDER_SITE_OTHER): Payer: Medicare Other | Admitting: Family Medicine

## 2018-09-08 VITALS — BP 122/68 | HR 63 | Temp 98.0°F | Ht 60.5 in | Wt 142.4 lb

## 2018-09-08 DIAGNOSIS — G8929 Other chronic pain: Secondary | ICD-10-CM

## 2018-09-08 DIAGNOSIS — L84 Corns and callosities: Secondary | ICD-10-CM | POA: Diagnosis not present

## 2018-09-08 DIAGNOSIS — M79672 Pain in left foot: Secondary | ICD-10-CM

## 2018-09-08 NOTE — Patient Instructions (Signed)
Corns and Calluses Corns are small areas of thickened skin that occur on the top, sides, or tip of a toe. They contain a cone-shaped core with a point that can press on a nerve below. This causes pain. Calluses are areas of thickened skin that can occur anywhere on the body including hands, fingers, palms, soles of the feet, and heels.Calluses are usually larger than corns. What are the causes? Corns and calluses are caused by rubbing (friction) or pressure, such as from shoes that are too tight or do not fit properly. What increases the risk? Corns are more likely to develop in people who have toe deformities, such as hammer toes. Since calluses can occur with friction to any area of the skin, calluses are more likely to develop in people who:  Work with their hands.  Wear shoes that fit poorly, shoes that are too tight, or shoes that are high-heeled.  Have toes deformities.  What are the signs or symptoms? Symptoms of a corn or callus include:  A hard growth on the skin.  Pain or tenderness under the skin.  Redness and swelling.  Increased discomfort while wearing tight-fitting shoes.  How is this diagnosed? Corns and calluses may be diagnosed with a medical history and physical exam. How is this treated? Corns and calluses may be treated with:  Removing the cause of the friction or pressure. This may include: ? Changing your shoes. ? Wearing shoe inserts (orthotics) or other protective layers in your shoes, such as a corn pad. ? Wearing gloves.  Medicines to help soften skin in the hardened, thickened areas.  Reducing the size of the corn or callus by removing the dead layers of skin.  Antibiotic medicines to treat infection.  Surgery, if a toe deformity is the cause.  Follow these instructions at home:  Take medicines only as directed by your health care provider.  If you were prescribed an antibiotic, finish all of it even if you start to feel better.  Wear  shoes that fit well. Avoid wearing high-heeled shoes and shoes that are too tight or too loose.  Wear any padding, protective layers, gloves, or orthotics as directed by your health care provider.  Soak your hands or feet and then use a file or pumice stone to soften your corn or callus. Do this as directed by your health care provider.  Check your corn or callus every day for signs of infection. Watch for: ? Redness, swelling, or pain. ? Fluid, blood, or pus. Contact a health care provider if:  Your symptoms do not improve with treatment.  You have increased redness, swelling, or pain at the site of your corn or callus.  You have fluid, blood, or pus coming from your corn or callus.  You have new symptoms. This information is not intended to replace advice given to you by your health care provider. Make sure you discuss any questions you have with your health care provider. Document Released: 06/16/2004 Document Revised: 03/30/2016 Document Reviewed: 09/06/2014 Elsevier Interactive Patient Education  2018 Elsevier Inc.  

## 2018-09-08 NOTE — Progress Notes (Signed)
Subjective:     Patient ID: [name], female   DOB: 1941/03/30, 77 y.o.   MRN: [id number]  HPI Patient is here to have a "spot "evaluate on her left foot.  She first noticed some pain there about 6 to 8 months ago.  Growing in size since then.  She has fairly severe pain with ambulation.  She has noted some thickened callus tissue on the medial mid aspect of the foot.  No history of diabetes.  No peripheral vascular disease.  She does have history of polyneuropathy.  Past Medical History:  Diagnosis Date  . Allergic rhinitis   . Anemia   . Anxiety   . Bronchiectasis   . Carpal tunnel syndrome 07/13/2015   Bilateral  . CVA (cerebral infarction) 10/2007   Right thalamic   . Diverticulosis of colon   . Gait disorder   . GERD (gastroesophageal reflux disease)   . History of breast cancer   . HTN (hypertension)   . Hyperlipidemia   . Idiopathic pulmonary fibrosis   . Left knee DJD   . Migraine   . Osteoporosis   . Peripheral neuropathy   . PMR (polymyalgia rheumatica) (HCC)   . Polyneuropathy in other diseases classified elsewhere ([place]) 02/17/2013  . Previous back surgery 10/09/12  . Renal insufficiency   . Rheumatoid arthritis ([place])   . Seizure disorder ([place])   . Spondylosis, cervical, with myelopathy 08/31/2015   C3-4 myelopathy  . Temporal arteritis (HCC)    Right eye blind, on steroids per [facility]/ Dr [name]  . Type II or unspecified type diabetes mellitus without mention of complication, not stated as uncontrolled    2nd to steriods  . Vitamin D deficiency    Past Surgical History:  Procedure Laterality Date  . CARPAL TUNNEL RELEASE Right   . CATARACT EXTRACTION     OS - Summer 2010  . CERVICAL FUSION  2010   4 rods and pins in place  . CHOLECYSTECTOMY    . COLONOSCOPY  12/15/2011   Procedure: COLONOSCOPY;  Surgeon: [name], MD;  Location: [facility];  Service: Endoscopy;  Laterality: N/A;  . LUMBAR FUSION  04/2010   W/Mechanical fixation - [place]  2013   rods in hips (to stabilize).  Marland Kitchen MASTECTOMY    . NECK SURGERY    . rheumatoid nodule removal    . SPINAL FUSION  07/2010   T10-L2 interbody fusion / [facility]  . TONSILLECTOMY    . TOTAL KNEE ARTHROPLASTY     Left    reports that she is a non-smoker but has been exposed to tobacco smoke. She has never used smokeless tobacco. She reports that she does not drink alcohol or use drugs. family history includes Arthritis in her father and mother; Brain cancer in her mother; Breast cancer in her sister; Dementia in her father; Hypertension in her father and mother; Lung cancer in her sister. Allergies  Allergen Reactions  . Hydromorphone Other (See Comments)    Cognitive changes      Review of Systems  Respiratory: Negative for shortness of breath.   Cardiovascular: Negative for chest pain.  Musculoskeletal: Negative for gait problem.       Objective:   Physical Exam Constitutional:      Appearance: Normal appearance.  Cardiovascular:     Rate and Rhythm: Normal rate and regular rhythm.  Pulmonary:     Effort: Pulmonary effort is normal.  Breath sounds: Normal breath sounds.  Skin:    Comments: Patient has very large and very thickened callus left foot medially about midway back.  No obvious underlying ulceration.  Diameter of callus is about 1.5 cm.  Neurological:     Mental Status: She is alert.        Assessment:     Large thickened callus left foot.  High risk of developing ulceration    Plan:     -Using #15 blade we trimmed back the callused area and patient did note some immediate relief.  -She is requesting referral to foot specialist in [place].  She is had some chronic foot pain and would like to get their input regarding management.  We will set this up  Eulas Post MD [facility]

## 2018-09-11 DIAGNOSIS — M316 Other giant cell arteritis: Secondary | ICD-10-CM | POA: Diagnosis not present

## 2018-09-11 DIAGNOSIS — E663 Overweight: Secondary | ICD-10-CM | POA: Diagnosis not present

## 2018-09-11 DIAGNOSIS — M79645 Pain in left finger(s): Secondary | ICD-10-CM | POA: Diagnosis not present

## 2018-09-11 DIAGNOSIS — M79672 Pain in left foot: Secondary | ICD-10-CM | POA: Diagnosis not present

## 2018-09-11 DIAGNOSIS — J8410 Pulmonary fibrosis, unspecified: Secondary | ICD-10-CM | POA: Diagnosis not present

## 2018-09-11 DIAGNOSIS — M5136 Other intervertebral disc degeneration, lumbar region: Secondary | ICD-10-CM | POA: Diagnosis not present

## 2018-09-11 DIAGNOSIS — Z6825 Body mass index (BMI) 25.0-25.9, adult: Secondary | ICD-10-CM | POA: Diagnosis not present

## 2018-09-11 DIAGNOSIS — M150 Primary generalized (osteo)arthritis: Secondary | ICD-10-CM | POA: Diagnosis not present

## 2018-09-11 DIAGNOSIS — M810 Age-related osteoporosis without current pathological fracture: Secondary | ICD-10-CM | POA: Diagnosis not present

## 2018-09-11 DIAGNOSIS — M0589 Other rheumatoid arthritis with rheumatoid factor of multiple sites: Secondary | ICD-10-CM | POA: Diagnosis not present

## 2018-09-11 DIAGNOSIS — N184 Chronic kidney disease, stage 4 (severe): Secondary | ICD-10-CM | POA: Diagnosis not present

## 2018-09-12 LAB — HM MAMMOGRAPHY

## 2018-09-30 DIAGNOSIS — Z79899 Other long term (current) drug therapy: Secondary | ICD-10-CM | POA: Diagnosis not present

## 2018-09-30 DIAGNOSIS — M0589 Other rheumatoid arthritis with rheumatoid factor of multiple sites: Secondary | ICD-10-CM | POA: Diagnosis not present

## 2018-10-02 ENCOUNTER — Ambulatory Visit (INDEPENDENT_AMBULATORY_CARE_PROVIDER_SITE_OTHER): Payer: Medicare Other | Admitting: Neurology

## 2018-10-02 ENCOUNTER — Encounter: Payer: Self-pay | Admitting: Neurology

## 2018-10-02 DIAGNOSIS — M4712 Other spondylosis with myelopathy, cervical region: Secondary | ICD-10-CM

## 2018-10-02 DIAGNOSIS — M316 Other giant cell arteritis: Secondary | ICD-10-CM

## 2018-10-02 DIAGNOSIS — G63 Polyneuropathy in diseases classified elsewhere: Secondary | ICD-10-CM | POA: Diagnosis not present

## 2018-10-02 NOTE — Progress Notes (Signed)
Reason for visit: Temporal arteritis  [name] is an 78 y.o. female  History of present illness:  [name] is a 78 year old right-handed white female with a history of temporal arteritis, cervical myelopathy, peripheral neuropathy, and a left L5 radiculopathy in the past.  The patient has been quite stable since last seen, she walks with a walker, she denies any falls.  She denies issues controlling the bowels or the bladder.  She does have discomfort in the feet associated with the peripheral neuropathy, she does not wish to go up on her gabapentin dosing.  She has a callus on the left foot that may require surgery in the near future.  She remains on 4 mg of prednisone daily, she had significant malaise with lower doses previously.  The patient otherwise denies any new significant issues.  Past Medical History:  Diagnosis Date  . Allergic rhinitis   . Anemia   . Anxiety   . Bronchiectasis   . Carpal tunnel syndrome 07/13/2015   Bilateral  . CVA (cerebral infarction) 10/2007   Right thalamic   . Diverticulosis of colon   . Gait disorder   . GERD (gastroesophageal reflux disease)   . History of breast cancer   . HTN (hypertension)   . Hyperlipidemia   . Idiopathic pulmonary fibrosis   . Left knee DJD   . Migraine   . Osteoporosis   . Peripheral neuropathy   . PMR (polymyalgia rheumatica) (HCC)   . Polyneuropathy in other diseases classified elsewhere ([place]) 02/17/2013  . Previous back surgery 10/09/12  . Renal insufficiency   . Rheumatoid arthritis ([place])   . Seizure disorder ([place])   . Spondylosis, cervical, with myelopathy 08/31/2015   C3-4 myelopathy  . Temporal arteritis (HCC)    Right eye blind, on steroids per [facility]/ Dr [name]  . Type II or unspecified type diabetes mellitus without mention of complication, not stated as uncontrolled    2nd to steriods  . Vitamin D deficiency     Past Surgical History:  Procedure Laterality Date  . CARPAL TUNNEL RELEASE  Right   . CATARACT EXTRACTION     OS - Summer 2010  . CERVICAL FUSION  2010   4 rods and pins in place  . CHOLECYSTECTOMY    . COLONOSCOPY  12/15/2011   Procedure: COLONOSCOPY;  Surgeon: [name], MD;  Location: [facility];  Service: Endoscopy;  Laterality: N/A;  . LUMBAR FUSION  04/2010   W/Mechanical fixation - [employer]  2013   rods in hips (to stabilize).  Marland Kitchen MASTECTOMY    . NECK SURGERY    . rheumatoid nodule removal    . SPINAL FUSION  07/2010   T10-L2 interbody fusion / [facility]  . TONSILLECTOMY    . TOTAL KNEE ARTHROPLASTY     Left    Family History  Problem Relation Age of Onset  . Brain cancer Mother   . Hypertension Mother   . Arthritis Mother   . Dementia Father   . Hypertension Father   . Arthritis Father   . Breast cancer Sister   . Lung cancer Sister   . Lung disease Neg Hx   . Rheumatologic disease Neg Hx     Social history:  reports that she is a non-smoker but has been exposed to tobacco smoke. She has never used smokeless tobacco. She reports that she does not drink alcohol or use drugs.    Allergies  Allergen Reactions  . Hydromorphone Other (See Comments)    Cognitive changes     Medications:  Prior to Admission medications   Medication Sig Start Date End Date Taking? Authorizing Provider  amLODipine (NORVASC) 5 MG tablet TAKE 1 TABLET DAILY 07/07/18  Yes Burchette, Alinda Sierras, MD  Calcium Carb-Cholecalciferol (CALCIUM CARBONATE-VITAMIN D3 PO) Take 1 tablet by mouth daily   Yes [provider]  cholecalciferol (VITAMIN D) 1000 UNITS tablet Take 1,000 Units by mouth daily.     Yes [provider]  denosumab (PROLIA) 60 MG/ML SOLN Inject 60 mg into the skin every 6 (six) months.     Yes [provider]  diclofenac sodium (VOLTAREN) 1 % GEL Apply 2 g topically 4 (four) times daily. 04/18/18  Yes Kathrynn Ducking, MD  ferrous sulfate 325 (65 FE) MG tablet Take 325 mg by mouth 2 (two)  times daily.    Yes [provider]  furosemide (LASIX) 40 MG tablet TAKE 1 TABLET DAILY 07/30/18  Yes Burchette, Alinda Sierras, MD  gabapentin (NEURONTIN) 300 MG capsule TAKE 2 CAPSULES TWICE A DAY 06/16/18  Yes Kathrynn Ducking, MD  Golimumab [facility] ARIA IV) Inject into the vein. Takes infusion every 8 wks.   Yes [provider]  KLOR-CON M10 10 MEQ tablet TAKE 1 TABLET TWICE A DAY Patient taking differently: Take 10 mEq by mouth daily.  08/27/16  Yes Burchette, Alinda Sierras, MD  Multiple Vitamins-Minerals (MULTIVITAMIN,TX-MINERALS) tablet Take 1 tablet by mouth daily.     Yes [provider]  oxyCODONE (OXYCONTIN) 10 mg 12 hr tablet Take 1 tablet (10 mg total) by mouth every 12 (twelve) hours. Fill in two months 07/02/18  Yes Burchette, Alinda Sierras, MD  oxyCODONE (OXYCONTIN) 10 mg 12 hr tablet Take 1 tablet (10 mg total) by mouth every 12 (twelve) hours. Fill in one month 07/02/18  Yes Burchette, Alinda Sierras, MD  oxyCODONE (OXYCONTIN) 10 mg 12 hr tablet Take 1 tablet (10 mg total) by mouth every 12 (twelve) hours. 07/02/18  Yes Burchette, Alinda Sierras, MD  Oxycodone HCl 10 MG TABS Take 1 tablet (10 mg total) by mouth every 6 (six) hours as needed. 07/25/18  Yes Burchette, Alinda Sierras, MD  predniSONE (DELTASONE) 1 MG tablet TAKE 4 TABLETS DAILY WITH BREAKFAST 05/19/18  Yes Kathrynn Ducking, MD  simvastatin (ZOCOR) 5 MG tablet TAKE 1 TABLET AT BEDTIME 04/08/18  Yes Burchette, Alinda Sierras, MD  potassium chloride (K-DUR) 10 MEQ tablet Take 1 tablet (10 mEq total) by mouth 2 (two) times daily. 12/29/12 03/02/13  Norins, Heinz Knuckles, MD    ROS:  Out of a complete 14 system review of symptoms, the patient complains only of the following symptoms, and all other reviewed systems are negative.  Ringing in the ears Black stools, constipation Frequency of urination Back pain, walking difficulty Itching Bruising easily Numbness, weakness  There were no vitals taken for this visit.  Physical Exam  General: The  patient is alert and cooperative at the time of the examination.  Skin: No significant peripheral edema is noted.   Neurologic Exam  Mental status: The patient is alert and oriented x 3 at the time of the examination. The patient has apparent normal recent and remote memory, with an apparently normal attention span and concentration ability.   Cranial nerves: Facial symmetry is present. Speech is normal, no aphasia or dysarthria is noted. Extraocular movements are full, exotropia of the right eye is noted on primary gaze and  with superior gaze.  The patient has very limited vision in the right eye. Visual fields are full.  Motor: The patient has good strength in all 4 extremities, with exception of a left foot drop.  Sensory examination: Soft touch sensation is symmetric on the face, arms, and legs.  Coordination: The patient has good finger-nose-finger and heel-to-shin bilaterally.  Gait and station: The patient has a slightly wide-based gait, the patient walks with a walker, she has good stability with a walker.  Reflexes: Deep tendon reflexes are symmetric, but are depressed.   Assessment/Plan:  1.  Temporal arteritis  2.  Peripheral neuropathy  3.  History of cervical myelopathy  4.  History of left L5 radiculopathy  5.  Gait disorder  6.  Rheumatoid arthritis  The patient will continue the low-dose prednisone, she does not wish to go up on the dose of the gabapentin.  She will follow-up in 9 months, sooner if needed.  The patient appears to be relatively stable currently.  Jill Alexanders MD 10/02/2018 7:11 PM  [facility] [address] [place], [place] [address]  Phone [phone] Fax [phone]

## 2018-10-08 ENCOUNTER — Other Ambulatory Visit: Payer: Self-pay | Admitting: Family Medicine

## 2018-10-08 NOTE — Telephone Encounter (Signed)
Copied from [place] [id number]. Topic: Quick Communication - See Telephone Encounter >> Oct 08, 2018  4:39 PM Ivar Drape wrote: CRM for notification. See Telephone encounter for: 10/08/18. Patient would like a refill on her oxyCODONE (OXYCONTIN) 10 mg 12 hr tablet medication and have it sent to her preferred pharmacy [facility] on [address] in [place].  Patient will be out of her meds tomorrow.

## 2018-10-09 NOTE — Telephone Encounter (Signed)
Last Rx given 07/25/2018 #90 with no refills.

## 2018-10-09 NOTE — Telephone Encounter (Signed)
Pt called and asked can the oxyCODONE be called in by Friday due to her running out of Medicine/ advised Pt of turn around time.

## 2018-10-10 NOTE — Telephone Encounter (Signed)
She should be on cycle for office follow up every 3 months for refills so that refills can be done at office visit.  Does she have pending follow up?

## 2018-10-13 ENCOUNTER — Telehealth: Payer: Self-pay

## 2018-10-13 NOTE — Telephone Encounter (Signed)
Patient called today and stated that she is in severe pain and discomfort and did not have any pain medication on Saturday and Sunday and she has an appointment on Tuesday, 10/14/18 at 9 am.   Patient asked if there is anything else besides Aspirin that she can take to help give her relief until she comes in tomorrow? Please advise.

## 2018-10-13 NOTE — Telephone Encounter (Signed)
Called patient and gave her message per Dr. Elease Hashimoto. Patient verbalized an understanding.

## 2018-10-13 NOTE — Telephone Encounter (Signed)
I am not sure how we got off our 3 month cycle.  Obviously, she can take Tylenol.  Generally do not recommend NSAIDS- given her age.

## 2018-10-14 ENCOUNTER — Other Ambulatory Visit: Payer: Self-pay

## 2018-10-14 ENCOUNTER — Ambulatory Visit (INDEPENDENT_AMBULATORY_CARE_PROVIDER_SITE_OTHER): Payer: Medicare Other | Admitting: Family Medicine

## 2018-10-14 ENCOUNTER — Encounter: Payer: Self-pay | Admitting: Family Medicine

## 2018-10-14 VITALS — BP 110/78 | HR 62 | Temp 98.0°F | Ht 60.5 in

## 2018-10-14 DIAGNOSIS — I10 Essential (primary) hypertension: Secondary | ICD-10-CM

## 2018-10-14 DIAGNOSIS — G8929 Other chronic pain: Secondary | ICD-10-CM | POA: Diagnosis not present

## 2018-10-14 DIAGNOSIS — M545 Low back pain: Secondary | ICD-10-CM

## 2018-10-14 DIAGNOSIS — L84 Corns and callosities: Secondary | ICD-10-CM

## 2018-10-14 MED ORDER — OXYCODONE HCL ER 10 MG PO T12A: 10.0000 mg | EXTENDED_RELEASE_TABLET | Freq: Two times a day (BID) | ORAL | 0 refills | Status: DC

## 2018-10-14 MED ORDER — OXYCODONE HCL 10 MG PO TABS: 10.0000 mg | ORAL_TABLET | Freq: Four times a day (QID) | ORAL | 0 refills | Status: DC | PRN

## 2018-10-14 NOTE — Progress Notes (Signed)
Subjective:     Patient ID: [name], female   DOB: 12-Mar-1941, 78 y.o.   MRN: [id number]  HPI   Patient seen for medical follow-up.  She has chronic low back pain and has been on oxycodone for several years.  She takes 10 mg every 12 hours.  She unfortunately got off cycle and ran out of her medication on Friday.  She did have a couple of leftover oxycodone 10 mg immediate release which she takes for breakthrough pain.  She took one this morning.  She had worsening pain over the weekend with running out of medication.  She feels that her current dosage is working fairly well and allows her to be more functional.  Her location of pain is mostly lower lumbar without sciatica.  No new symptoms.  No constipation issues.  Hypertension treated with amlodipine.  Blood pressures been stable.  No headaches or dizziness.  No chest pains.  We trimmed a very large callus left foot last visit.  This felt much better afterwards but is recurring.  She has follow-up scheduled with podiatrist at the end of this month.  No history of diabetes.  No peripheral vascular disease.  She does have fallen arches.  Past Medical History:  Diagnosis Date  . Allergic rhinitis   . Anemia   . Anxiety   . Bronchiectasis   . Carpal tunnel syndrome 07/13/2015   Bilateral  . CVA (cerebral infarction) 10/2007   Right thalamic   . Diverticulosis of colon   . Gait disorder   . GERD (gastroesophageal reflux disease)   . History of breast cancer   . HTN (hypertension)   . Hyperlipidemia   . Idiopathic pulmonary fibrosis   . Left knee DJD   . Migraine   . Osteoporosis   . Peripheral neuropathy   . PMR (polymyalgia rheumatica) (HCC)   . Polyneuropathy in other diseases classified elsewhere ([place]) 02/17/2013  . Previous back surgery 10/09/12  . Renal insufficiency   . Rheumatoid arthritis ([place])   . Seizure disorder ([place])   . Spondylosis, cervical, with myelopathy 08/31/2015   C3-4 myelopathy  . Temporal arteritis  (HCC)    Right eye blind, on steroids per Neruro/ Dr [name]  . Type II or unspecified type diabetes mellitus without mention of complication, not stated as uncontrolled    2nd to steriods  . Vitamin D deficiency    Past Surgical History:  Procedure Laterality Date  . CARPAL TUNNEL RELEASE Right   . CATARACT EXTRACTION     OS - Summer 2010  . CERVICAL FUSION  2010   4 rods and pins in place  . CHOLECYSTECTOMY    . COLONOSCOPY  12/15/2011   Procedure: COLONOSCOPY;  Surgeon: [name], MD;  [facility]: [facility];  Service: Endoscopy;  Laterality: N/A;  . LUMBAR FUSION  04/2010   W/Mechanical fixation - [place]  2013   rods in hips (to stabilize).  Marland Kitchen MASTECTOMY    . NECK SURGERY    . rheumatoid nodule removal    . SPINAL FUSION  07/2010   T10-L2 interbody fusion / [facility]  . TONSILLECTOMY    . TOTAL KNEE ARTHROPLASTY     Left    reports that she is a non-smoker but has been exposed to tobacco smoke. She has never used smokeless tobacco. She reports that she does not drink alcohol or use drugs. family history includes Arthritis in her father and mother; Brain cancer  in her mother; Breast cancer in her sister; Dementia in her father; Hypertension in her father and mother; Lung cancer in her sister. Allergies  Allergen Reactions  . Hydromorphone Other (See Comments)    Cognitive changes        Review of Systems  Constitutional: Negative for appetite change, fatigue and unexpected weight change.  Eyes: Negative for visual disturbance.  Respiratory: Negative for cough, chest tightness, shortness of breath and wheezing.   Cardiovascular: Negative for chest pain, palpitations and leg swelling.  Genitourinary: Negative for dysuria.  Musculoskeletal: Positive for back pain.  Neurological: Negative for dizziness, seizures, syncope, weakness, light-headedness and headaches.       Objective:   Physical Exam Constitutional:       Appearance: She is well-developed.  Eyes:     Pupils: Pupils are equal, round, and reactive to light.  Neck:     Musculoskeletal: Neck supple.     Thyroid: No thyromegaly.     Vascular: No JVD.  Cardiovascular:     Rate and Rhythm: Normal rate and regular rhythm.     Heart sounds: No gallop.   Pulmonary:     Effort: Pulmonary effort is normal. No respiratory distress.     Breath sounds: Normal breath sounds. No wheezing or rales.  Skin:    Comments: Very large hardened callus left foot medially about midway back.  No ulceration.  Neurological:     Mental Status: She is alert.        Assessment:     #1 hypertension stable and at goal  #2 chronic low back pain stable on regimen of OxyContin 10 mg every 12 hours  #3 large callus left foot    Plan:     -Continue current medications -Obtain drug screen -Refilled OxyContin 10 mg every 12 hours for 3 months and will schedule III month follow-up  Eulas Post MD [facility] at [facility]

## 2018-10-14 NOTE — Telephone Encounter (Signed)
Patient is here now

## 2018-10-16 LAB — PAIN MGMT, PROFILE 8 W/CONF, U
6 Acetylmorphine: NEGATIVE ng/mL (ref <10)
Alcohol Metabolites: NEGATIVE ng/mL (ref <500)
Amphetamines: NEGATIVE ng/mL (ref <500)
Benzodiazepines: NEGATIVE ng/mL (ref <100)
Buprenorphine, Urine: NEGATIVE ng/mL (ref <5)
CODEINE: NEGATIVE ng/mL (ref <50)
Cocaine Metabolite: NEGATIVE ng/mL (ref <150)
Creatinine: 46.1 mg/dL
HYDROMORPHONE: NEGATIVE ng/mL (ref <50)
Hydrocodone: NEGATIVE ng/mL (ref <50)
MDMA: NEGATIVE ng/mL (ref <500)
Marijuana Metabolite: NEGATIVE ng/mL (ref <20)
Morphine: NEGATIVE ng/mL (ref <50)
Norhydrocodone: NEGATIVE ng/mL (ref <50)
Noroxycodone: 3639 ng/mL — ABNORMAL HIGH (ref <50)
Opiates: NEGATIVE ng/mL (ref <100)
Oxidant: NEGATIVE ug/mL (ref <200)
Oxycodone: 2103 ng/mL — ABNORMAL HIGH (ref <50)
Oxycodone: POSITIVE ng/mL — AB (ref <100)
Oxymorphone: 1541 ng/mL — ABNORMAL HIGH (ref <50)
pH: 6.83 (ref 4.5–9.0)

## 2018-10-20 DIAGNOSIS — Z961 Presence of intraocular lens: Secondary | ICD-10-CM | POA: Diagnosis not present

## 2018-10-20 DIAGNOSIS — H04123 Dry eye syndrome of bilateral lacrimal glands: Secondary | ICD-10-CM | POA: Diagnosis not present

## 2018-10-20 DIAGNOSIS — H4720 Unspecified optic atrophy: Secondary | ICD-10-CM | POA: Diagnosis not present

## 2018-10-23 DIAGNOSIS — Q828 Other specified congenital malformations of skin: Secondary | ICD-10-CM | POA: Diagnosis not present

## 2018-10-23 DIAGNOSIS — M722 Plantar fascial fibromatosis: Secondary | ICD-10-CM | POA: Diagnosis not present

## 2018-10-23 DIAGNOSIS — L851 Acquired keratosis [keratoderma] palmaris et plantaris: Secondary | ICD-10-CM | POA: Diagnosis not present

## 2018-11-11 ENCOUNTER — Telehealth: Payer: Self-pay | Admitting: Family Medicine

## 2018-11-11 NOTE — Telephone Encounter (Signed)
Please see message. Please advise before I call patient back.

## 2018-11-11 NOTE — Telephone Encounter (Signed)
Pt is requesting a call back from her provider regarding the lab result of her creatinine. See telephone encounter.

## 2018-11-11 NOTE — Telephone Encounter (Signed)
Called patient and LMOVM to return call  [place] for [facility] to Discuss results / PCP / recommendations / Schedule patient  Per Dr. Elease Hashimoto: This is urine (not serum).   The range is different.  She does not have serum creatinine of 46- that would be incompatible with life.  CRM Created.

## 2018-11-11 NOTE — Telephone Encounter (Signed)
This is urine (not serum).   The range is different.  She does not have serum creatinine of 46- that would be incompatible with life.

## 2018-11-11 NOTE — Telephone Encounter (Signed)
Copied from [place]. Topic: General - Inquiry >> Nov 11, 2018  9:01 AM Virl Axe D wrote: Reason for CRM: Pt has a question regarding her lab results from 10/14/18 under her pain management profile. She stated her Creatine says 46.1 but it has always been 1.2. She does not understand the jump and stated if that is accurate she should be in the hospital on Dialysis. She would like to speak with a nurse to discuss. Please advise.

## 2018-11-12 NOTE — Telephone Encounter (Signed)
Please see new message

## 2018-11-12 NOTE — Telephone Encounter (Signed)
Patient called and given the results of Dr. Elease Hashimoto below on 11/11/18, patient verbalized understanding. She asks what does it mean on her drug testing "inconsistent" when she's taking the prescribed amount of Oxycodone? I advised what inconsistent means and she verbalized understanding, but says she doesn't understand it being inconsistent when she's taking her medications as prescribed. She says she would like an explanation, so she will understand. I advised I will send to Dr. Elease Hashimoto and someone will call with his recommendation.

## 2018-11-12 NOTE — Telephone Encounter (Signed)
Called patient and we have her next OV on 11/19/18 at 11:20 am. Patient verbalized an understanding.

## 2018-11-12 NOTE — Telephone Encounter (Signed)
Set up follow up for further questions.

## 2018-11-19 ENCOUNTER — Encounter: Payer: Self-pay | Admitting: Family Medicine

## 2018-11-19 ENCOUNTER — Ambulatory Visit (INDEPENDENT_AMBULATORY_CARE_PROVIDER_SITE_OTHER): Payer: Medicare Other | Admitting: Family Medicine

## 2018-11-19 ENCOUNTER — Other Ambulatory Visit: Payer: Self-pay

## 2018-11-19 VITALS — BP 110/74 | HR 77 | Temp 98.0°F | Ht 60.5 in | Wt 141.0 lb

## 2018-11-19 DIAGNOSIS — M545 Low back pain: Secondary | ICD-10-CM | POA: Diagnosis not present

## 2018-11-19 DIAGNOSIS — G8929 Other chronic pain: Secondary | ICD-10-CM | POA: Diagnosis not present

## 2018-11-19 NOTE — Progress Notes (Signed)
Subjective:     Patient ID: [name], female   DOB: 1940-11-07, 78 y.o.   MRN: [id number]  HPI Patient seen today with questions regarding her recent drug screen and also a couple questions regarding her drug contract.  She has chronic low back pain and chronic neck pain takes oxycodone and has been on this for several years.  No history of misuse.  Her first concern is that she saw her recent urine drug screen results which listed urine creatinine of 46.  She was concerned about why this number was so high as she was comparing this to serum creatinine ranges we explained that her number was actually normal for urine creatinine  She also had several questions regarding why "consistent ", and "inconsistent "terminology was used on drug screen results.  She also had a couple of questions regarding her chronic pain management contract.  This had been signed back in May 2018  Past Medical History:  Diagnosis Date  . Allergic rhinitis   . Anemia   . Anxiety   . Bronchiectasis   . Carpal tunnel syndrome 07/13/2015   Bilateral  . CVA (cerebral infarction) 10/2007   Right thalamic   . Diverticulosis of colon   . Gait disorder   . GERD (gastroesophageal reflux disease)   . History of breast cancer   . HTN (hypertension)   . Hyperlipidemia   . Idiopathic pulmonary fibrosis   . Left knee DJD   . Migraine   . Osteoporosis   . Peripheral neuropathy   . PMR (polymyalgia rheumatica) (HCC)   . Polyneuropathy in other diseases classified elsewhere ([place]) 02/17/2013  . Previous back surgery 10/09/12  . Renal insufficiency   . Rheumatoid arthritis ([place])   . Seizure disorder ([place])   . Spondylosis, cervical, with myelopathy 08/31/2015   C3-4 myelopathy  . Temporal arteritis (HCC)    Right eye blind, on steroids per [facility]/ Dr [name]  . Type II or unspecified type diabetes mellitus without mention of complication, not stated as uncontrolled    2nd to steriods  . Vitamin D deficiency     Past Surgical History:  Procedure Laterality Date  . CARPAL TUNNEL RELEASE Right   . CATARACT EXTRACTION     OS - Summer 2010  . CERVICAL FUSION  2010   4 rods and pins in place  . CHOLECYSTECTOMY    . COLONOSCOPY  12/15/2011   Procedure: COLONOSCOPY;  Surgeon: [name], MD;  [facility];  Service: Endoscopy;  Laterality: N/A;  . LUMBAR FUSION  04/2010   W/Mechanical fixation - [place]  2013   rods in hips (to stabilize).  Marland Kitchen MASTECTOMY    . NECK SURGERY    . rheumatoid nodule removal    . SPINAL FUSION  07/2010   T10-L2 interbody fusion / [facility]  . TONSILLECTOMY    . TOTAL KNEE ARTHROPLASTY     Left    reports that she is a non-smoker but has been exposed to tobacco smoke. She has never used smokeless tobacco. She reports that she does not drink alcohol or use drugs. family history includes Arthritis in her father and mother; Brain cancer in her mother; Breast cancer in her sister; Dementia in her father; Hypertension in her father and mother; Lung cancer in her sister. Allergies  Allergen Reactions  . Hydromorphone Other (See Comments)    Cognitive changes      Review of Systems  Constitutional: Negative  for chills and fever.  Respiratory: Negative for shortness of breath.   Cardiovascular: Negative for chest pain.  Musculoskeletal: Positive for back pain.       Objective:   Physical Exam Constitutional:      Appearance: Normal appearance.  Cardiovascular:     Rate and Rhythm: Normal rate and regular rhythm.  Pulmonary:     Effort: Pulmonary effort is normal.     Breath sounds: Normal breath sounds.  Neurological:     Mental Status: She is alert.        Assessment:     Chronic back pain.  Stable on current regimen of oxycodone    Plan:     -We tried to answer questions as best we could regarding her recent drug screen results.  We explained that we did not see any concerning issues.  We also gave  her reassurance regarding her urine creatinine level.  We did review her drug contract again and answered questions regarding that  Eulas Post MD [facility] at [facility]

## 2018-11-25 DIAGNOSIS — Z79899 Other long term (current) drug therapy: Secondary | ICD-10-CM | POA: Diagnosis not present

## 2018-11-25 DIAGNOSIS — M0589 Other rheumatoid arthritis with rheumatoid factor of multiple sites: Secondary | ICD-10-CM | POA: Diagnosis not present

## 2018-12-09 ENCOUNTER — Other Ambulatory Visit: Payer: Self-pay | Admitting: Neurology

## 2018-12-29 DIAGNOSIS — M810 Age-related osteoporosis without current pathological fracture: Secondary | ICD-10-CM | POA: Diagnosis not present

## 2019-01-03 ENCOUNTER — Other Ambulatory Visit: Payer: Self-pay | Admitting: Family Medicine

## 2019-01-05 ENCOUNTER — Ambulatory Visit (INDEPENDENT_AMBULATORY_CARE_PROVIDER_SITE_OTHER): Payer: Medicare Other | Admitting: Family Medicine

## 2019-01-05 ENCOUNTER — Other Ambulatory Visit: Payer: Self-pay

## 2019-01-05 DIAGNOSIS — G8929 Other chronic pain: Secondary | ICD-10-CM | POA: Diagnosis not present

## 2019-01-05 DIAGNOSIS — M545 Low back pain, unspecified: Secondary | ICD-10-CM

## 2019-01-05 DIAGNOSIS — I10 Essential (primary) hypertension: Secondary | ICD-10-CM

## 2019-01-05 DIAGNOSIS — E785 Hyperlipidemia, unspecified: Secondary | ICD-10-CM

## 2019-01-05 NOTE — Progress Notes (Signed)
Patient ID: [name], female   DOB: 12-15-1940, 78 y.o.   MRN: [id number]  Virtual Visit via Video Note  I connected with [name] on 01/05/19 at 10:00 AM EDT by a video enabled telemedicine application and verified that I am speaking with the correct person using two identifiers.  Location patient: home Location provider:work or home office Persons participating in the virtual visit: patient, provider  I discussed the limitations of evaluation and management by telemedicine and the availability of in person appointments. The patient expressed understanding and agreed to proceed.   HPI: Patient has chronic problems including hypertension, history of temporal arteritis, pulmonary fibrosis, spondylosis of the cervical spine, osteoporosis, chronic renal insufficiency, hyperlipidemia, history of breast cancer, history of CVA  She is doing fairly well with regard to her chronic back pain.  She takes OxyContin 10 mg twice daily.  This seems to be controlling her fair pain fairly well.  Denies any side effects. More functional when taking her pain medication.    She has hypertension which has been stable.  Recent readings around 110/70.  She has hyperlipidemia and is on simvastatin for that.  Her lipids were checked last May.  She denies any myalgias.   ROS: See pertinent positives and negatives per HPI.  Past Medical History:  Diagnosis Date  . Allergic rhinitis   . Anemia   . Anxiety   . Bronchiectasis   . Carpal tunnel syndrome 07/13/2015   Bilateral  . CVA (cerebral infarction) 10/2007   Right thalamic   . Diverticulosis of colon   . Gait disorder   . GERD (gastroesophageal reflux disease)   . History of breast cancer   . HTN (hypertension)   . Hyperlipidemia   . Idiopathic pulmonary fibrosis   . Left knee DJD   . Migraine   . Osteoporosis   . Peripheral neuropathy   . PMR (polymyalgia rheumatica) (HCC)   . Polyneuropathy in other diseases classified elsewhere  ([place]) 02/17/2013  . Previous back surgery 10/09/12  . Renal insufficiency   . Rheumatoid arthritis ([place])   . Seizure disorder ([place])   . Spondylosis, cervical, with myelopathy 08/31/2015   C3-4 myelopathy  . Temporal arteritis (HCC)    Right eye blind, on steroids per Ner[facility]/ Dr [name]  . Type II or unspecified type diabetes mellitus without mention of complication, not stated as uncontrolled    2nd to steriods  . Vitamin D deficiency     Past Surgical History:  Procedure Laterality Date  . CARPAL TUNNEL RELEASE Right   . CATARACT EXTRACTION     OS - Summer 2010  . CERVICAL FUSION  2010   4 rods and pins in place  . CHOLECYSTECTOMY    . COLONOSCOPY  12/15/2011   Procedure: COLONOSCOPY;  Surgeon: [name], MD;  [facility];  Service: Endoscopy;  Laterality: N/A;  . LUMBAR FUSION  04/2010   W/Mechanical fixation - [place]  2013   rods in hips (to stabilize).  Marland Kitchen MASTECTOMY    . NECK SURGERY    . rheumatoid nodule removal    . SPINAL FUSION  07/2010   T10-L2 interbody fusion / [facility]  . TONSILLECTOMY    . TOTAL KNEE ARTHROPLASTY     Left    Family History  Problem Relation Age of Onset  . Brain cancer Mother   . Hypertension Mother   . Arthritis Mother   . Dementia Father   .  Hypertension Father   . Arthritis Father   . Breast cancer Sister   . Lung cancer Sister   . Lung disease Neg Hx   . Rheumatologic disease Neg Hx     SOCIAL HX: never smoked.  Widowed.   Current Outpatient Medications:  .  amLODipine (NORVASC) 5 MG tablet, TAKE 1 TABLET DAILY, Disp: 90 tablet, Rfl: 2 .  Calcium Carb-Cholecalciferol (CALCIUM CARBONATE-VITAMIN D3 PO), Take 1 tablet by mouth daily, Disp: , Rfl:  .  cholecalciferol (VITAMIN D) 1000 UNITS tablet, Take 1,000 Units by mouth daily.  , Disp: , Rfl:  .  denosumab (PROLIA) 60 MG/ML SOLN, Inject 60 mg into the skin every 6 (six) months.  , Disp: , Rfl:  .  diclofenac sodium (VOLTAREN)  1 % GEL, Apply 2 g topically 4 (four) times daily., Disp: 4 Tube, Rfl: 3 .  ferrous sulfate 325 (65 FE) MG tablet, Take 325 mg by mouth 2 (two) times daily. , Disp: , Rfl:  .  furosemide (LASIX) 40 MG tablet, TAKE 1 TABLET DAILY, Disp: 90 tablet, Rfl: 4 .  gabapentin (NEURONTIN) 300 MG capsule, TAKE 2 CAPSULES TWICE A DAY, Disp: 360 capsule, Rfl: 3 .  Golimumab (SIMPONI ARIA IV), Inject into the vein. Takes infusion every 8 wks., Disp: , Rfl:  .  KLOR-CON M10 10 MEQ tablet, TAKE 1 TABLET TWICE A DAY (Patient taking differently: Take 10 mEq by mouth daily. ), Disp: 180 tablet, Rfl: 3 .  Multiple Vitamins-Minerals (MULTIVITAMIN,TX-MINERALS) tablet, Take 1 tablet by mouth daily.  , Disp: , Rfl:  .  oxyCODONE (OXYCONTIN) 10 mg 12 hr tablet, Take 1 tablet (10 mg total) by mouth every 12 (twelve) hours. Fill in two months, Disp: 60 tablet, Rfl: 0 .  oxyCODONE (OXYCONTIN) 10 mg 12 hr tablet, Take 1 tablet (10 mg total) by mouth every 12 (twelve) hours. Fill in one month, Disp: 60 tablet, Rfl: 0 .  oxyCODONE (OXYCONTIN) 10 mg 12 hr tablet, Take 1 tablet (10 mg total) by mouth every 12 (twelve) hours., Disp: 60 tablet, Rfl: 0 .  Oxycodone HCl 10 MG TABS, Take 1 tablet (10 mg total) by mouth every 6 (six) hours as needed., Disp: 90 tablet, Rfl: 0 .  predniSONE (DELTASONE) 1 MG tablet, TAKE 4 TABLETS DAILY WITH BREAKFAST, Disp: 360 tablet, Rfl: 3 .  simvastatin (ZOCOR) 5 MG tablet, TAKE 1 TABLET AT BEDTIME, Disp: 90 tablet, Rfl: 2  EXAM:  VITALS per patient if applicable:  GENERAL: alert, oriented, appears well and in no acute distress  HEENT: atraumatic, conjunttiva clear, no obvious abnormalities on inspection of external nose and ears  NECK: normal movements of the head and neck  LUNGS: on inspection no signs of respiratory distress, breathing rate appears normal, no obvious gross SOB, gasping or wheezing  CV: no obvious cyanosis  MS: moves all visible extremities without noticeable  abnormality  PSYCH/NEURO: pleasant and cooperative, no obvious depression or anxiety, speech and thought processing grossly intact  ASSESSMENT AND PLAN:  Discussed the following assessment and plan:  #1 chronic back pain-stable -We will refill her OxyContin.  She is not due until April 19  #2 dyslipidemia -Patient needs follow-up labs but will wait until current pandemic crisis has resolved.  Hopefully we can get her back for lipids in 3 months  #3 hypertension stable by home readings -Continue current blood pressure medication with amlodipine     I discussed the assessment and treatment plan with the patient. The patient was provided  an opportunity to ask questions and all were answered. The patient agreed with the plan and demonstrated an understanding of the instructions.   The patient was advised to call back or seek an in-person evaluation if the symptoms worsen or if the condition fails to improve as anticipated   Carolann Littler, MD

## 2019-01-09 ENCOUNTER — Other Ambulatory Visit: Payer: Self-pay

## 2019-01-09 ENCOUNTER — Ambulatory Visit: Payer: Medicare Other | Admitting: Family Medicine

## 2019-01-09 MED ORDER — POTASSIUM CHLORIDE CRYS ER 10 MEQ PO TBCR: 10.0000 meq | EXTENDED_RELEASE_TABLET | Freq: Two times a day (BID) | ORAL | 3 refills | Status: DC

## 2019-01-09 MED ORDER — OXYCODONE HCL ER 10 MG PO T12A: 10.0000 mg | EXTENDED_RELEASE_TABLET | Freq: Two times a day (BID) | ORAL | 0 refills | Status: DC

## 2019-01-09 NOTE — Addendum Note (Signed)
Addended by: Eulas Post on: 01/09/2019 09:12 AM   Modules accepted: Orders

## 2019-01-20 DIAGNOSIS — M0589 Other rheumatoid arthritis with rheumatoid factor of multiple sites: Secondary | ICD-10-CM | POA: Diagnosis not present

## 2019-01-26 DIAGNOSIS — M810 Age-related osteoporosis without current pathological fracture: Secondary | ICD-10-CM | POA: Diagnosis not present

## 2019-01-26 DIAGNOSIS — M5136 Other intervertebral disc degeneration, lumbar region: Secondary | ICD-10-CM | POA: Diagnosis not present

## 2019-01-26 DIAGNOSIS — M79672 Pain in left foot: Secondary | ICD-10-CM | POA: Diagnosis not present

## 2019-01-26 DIAGNOSIS — Z6825 Body mass index (BMI) 25.0-25.9, adult: Secondary | ICD-10-CM | POA: Diagnosis not present

## 2019-01-26 DIAGNOSIS — J8410 Pulmonary fibrosis, unspecified: Secondary | ICD-10-CM | POA: Diagnosis not present

## 2019-01-26 DIAGNOSIS — E663 Overweight: Secondary | ICD-10-CM | POA: Diagnosis not present

## 2019-01-26 DIAGNOSIS — N184 Chronic kidney disease, stage 4 (severe): Secondary | ICD-10-CM | POA: Diagnosis not present

## 2019-01-26 DIAGNOSIS — M150 Primary generalized (osteo)arthritis: Secondary | ICD-10-CM | POA: Diagnosis not present

## 2019-01-26 DIAGNOSIS — M79645 Pain in left finger(s): Secondary | ICD-10-CM | POA: Diagnosis not present

## 2019-01-26 DIAGNOSIS — M316 Other giant cell arteritis: Secondary | ICD-10-CM | POA: Diagnosis not present

## 2019-01-26 DIAGNOSIS — M0589 Other rheumatoid arthritis with rheumatoid factor of multiple sites: Secondary | ICD-10-CM | POA: Diagnosis not present

## 2019-03-17 DIAGNOSIS — M0589 Other rheumatoid arthritis with rheumatoid factor of multiple sites: Secondary | ICD-10-CM | POA: Diagnosis not present

## 2019-03-20 DIAGNOSIS — M79672 Pain in left foot: Secondary | ICD-10-CM | POA: Diagnosis not present

## 2019-03-20 DIAGNOSIS — M722 Plantar fascial fibromatosis: Secondary | ICD-10-CM | POA: Diagnosis not present

## 2019-03-20 DIAGNOSIS — L851 Acquired keratosis [keratoderma] palmaris et plantaris: Secondary | ICD-10-CM | POA: Diagnosis not present

## 2019-03-20 DIAGNOSIS — Q828 Other specified congenital malformations of skin: Secondary | ICD-10-CM | POA: Diagnosis not present

## 2019-03-24 ENCOUNTER — Other Ambulatory Visit: Payer: Self-pay | Admitting: Neurology

## 2019-03-24 ENCOUNTER — Other Ambulatory Visit: Payer: Self-pay | Admitting: Family Medicine

## 2019-04-03 ENCOUNTER — Other Ambulatory Visit: Payer: Self-pay | Admitting: Family Medicine

## 2019-04-03 ENCOUNTER — Encounter: Payer: Self-pay | Admitting: Family Medicine

## 2019-04-03 ENCOUNTER — Ambulatory Visit (INDEPENDENT_AMBULATORY_CARE_PROVIDER_SITE_OTHER): Payer: Medicare Other | Admitting: Family Medicine

## 2019-04-03 ENCOUNTER — Other Ambulatory Visit: Payer: Self-pay

## 2019-04-03 VITALS — BP 120/82 | HR 77 | Temp 98.0°F | Ht 60.5 in | Wt 143.8 lb

## 2019-04-03 DIAGNOSIS — N182 Chronic kidney disease, stage 2 (mild): Secondary | ICD-10-CM | POA: Diagnosis not present

## 2019-04-03 DIAGNOSIS — E785 Hyperlipidemia, unspecified: Secondary | ICD-10-CM

## 2019-04-03 DIAGNOSIS — G8929 Other chronic pain: Secondary | ICD-10-CM

## 2019-04-03 DIAGNOSIS — M545 Low back pain, unspecified: Secondary | ICD-10-CM

## 2019-04-03 DIAGNOSIS — I10 Essential (primary) hypertension: Secondary | ICD-10-CM

## 2019-04-03 LAB — LIPID PANEL
Cholesterol: 147 mg/dL (ref 0–200)
HDL: 72.4 mg/dL (ref >39.00)
LDL Cholesterol: 60 mg/dL (ref 0–99)
NonHDL: 74.51
Total CHOL/HDL Ratio: 2
Triglycerides: 74 mg/dL (ref 0.0–149.0)
VLDL: 14.8 mg/dL (ref 0.0–40.0)

## 2019-04-03 NOTE — Progress Notes (Signed)
Subjective:     Patient ID: [name], female   DOB: 01-17-1941, 78 y.o.   MRN: [id number]  HPI Patient here for chronic pain management.  She has chronic back pain.  She has been on OxyContin for several years.  She is on fairly low dose and this is controlling her pain fairly well.  This allows her to be more active.  She has not any recent constipation issues.  She has multiple other chronic problems including history of breast cancer, history of CVA, pulmonary fibrosis, osteoporosis, rheumatoid arthritis, hypertension  Medications reviewed.  Compliant with all.  She is followed by rheumatology and gets CBC and CMP about every 6 months.  She is due for lipids.  She remains on cholesterol medication.  Past Medical History:  Diagnosis Date  . Allergic rhinitis   . Anemia   . Anxiety   . Bronchiectasis   . Carpal tunnel syndrome 07/13/2015   Bilateral  . CVA (cerebral infarction) 10/2007   Right thalamic   . Diverticulosis of colon   . Gait disorder   . GERD (gastroesophageal reflux disease)   . History of breast cancer   . HTN (hypertension)   . Hyperlipidemia   . Idiopathic pulmonary fibrosis   . Left knee DJD   . Migraine   . Osteoporosis   . Peripheral neuropathy   . PMR (polymyalgia rheumatica) (HCC)   . Polyneuropathy in other diseases classified elsewhere ([place]) 02/17/2013  . Previous back surgery 10/09/12  . Renal insufficiency   . Rheumatoid arthritis ([place])   . Seizure disorder ([place])   . Spondylosis, cervical, with myelopathy 08/31/2015   C3-4 myelopathy  . Temporal arteritis (HCC)    Right eye blind, on steroids per [facility]er[facility]/ Dr [name]  . Type II or unspecified type diabetes mellitus without mention of complication, not stated as uncontrolled    2nd to steriods  . Vitamin D deficiency    Past Surgical History:  Procedure Laterality Date  . CARPAL TUNNEL RELEASE Right   . CATARACT EXTRACTION     OS - Summer 2010  . CERVICAL FUSION  2010   4 rods and pins in  place  . CHOLECYSTECTOMY    . COLONOSCOPY  12/15/2011   Procedure: COLONOSCOPY;  Surgeon: [name], MD;  [facility];  Service: Endoscopy;  Laterality: N/A;  . LUMBAR FUSION  04/2010   W/Mechanical fixation - [place]  2013   rods in hips (to stabilize).  Marland Kitchen MASTECTOMY    . NECK SURGERY    . rheumatoid nodule removal    . SPINAL FUSION  07/2010   T10-L2 interbody fusion / [facility]  . TONSILLECTOMY    . TOTAL KNEE ARTHROPLASTY     Left    reports that she is a non-smoker but has been exposed to tobacco smoke. She has never used smokeless tobacco. She reports that she does not drink alcohol or use drugs. family history includes Arthritis in her father and mother; Brain cancer in her mother; Breast cancer in her sister; Dementia in her father; Hypertension in her father and mother; Lung cancer in her sister. Allergies  Allergen Reactions  . Hydromorphone Other (See Comments)    Cognitive changes      Review of Systems  Constitutional: Negative for fatigue.  Eyes: Negative for visual disturbance.  Respiratory: Negative for cough, chest tightness, shortness of breath and wheezing.   Cardiovascular: Negative for chest pain, palpitations and leg swelling.  Endocrine: Negative for polydipsia and polyuria.  Musculoskeletal: Positive for arthralgias and back pain.  Neurological: Negative for dizziness, seizures, syncope, weakness, light-headedness and headaches.       Objective:   Physical Exam Constitutional:      Appearance: She is well-developed.  Eyes:     Pupils: Pupils are equal, round, and reactive to light.  Neck:     Musculoskeletal: Neck supple.     Thyroid: No thyromegaly.     Vascular: No JVD.  Cardiovascular:     Rate and Rhythm: Normal rate and regular rhythm.     Heart sounds: No gallop.   Pulmonary:     Effort: Pulmonary effort is normal. No respiratory distress.     Breath sounds: Normal breath sounds. No  wheezing or rales.  Musculoskeletal:     Right lower leg: No edema.     Left lower leg: No edema.  Neurological:     Mental Status: She is alert.        Assessment:     #1 hypertension stable and at goal  #2 hyperlipidemia.  She takes low-dose simvastatin-overdue for lipids  #3 chronic back pain stable on low-dose OxyContin    Plan:     -Check fasting lipid panel -Refill pain medications for 3 months -Routine follow-up in 3 months and sooner as needed  Eulas Post MD [facility] at [facility]

## 2019-04-05 ENCOUNTER — Encounter: Payer: Self-pay | Admitting: Family Medicine

## 2019-04-06 ENCOUNTER — Ambulatory Visit: Payer: Medicare Other | Admitting: Family Medicine

## 2019-04-08 MED ORDER — OXYCODONE HCL ER 10 MG PO T12A: 10.0000 mg | EXTENDED_RELEASE_TABLET | Freq: Two times a day (BID) | ORAL | 0 refills | Status: DC

## 2019-04-08 NOTE — Addendum Note (Signed)
Addended by: Eulas Post on: 04/08/2019 01:16 PM   Modules accepted: Orders

## 2019-04-17 DIAGNOSIS — J479 Bronchiectasis, uncomplicated: Secondary | ICD-10-CM | POA: Diagnosis not present

## 2019-04-17 DIAGNOSIS — M4802 Spinal stenosis, cervical region: Secondary | ICD-10-CM | POA: Diagnosis not present

## 2019-04-17 DIAGNOSIS — J8410 Pulmonary fibrosis, unspecified: Secondary | ICD-10-CM | POA: Diagnosis not present

## 2019-04-17 DIAGNOSIS — M069 Rheumatoid arthritis, unspecified: Secondary | ICD-10-CM | POA: Diagnosis not present

## 2019-04-17 DIAGNOSIS — N179 Acute kidney failure, unspecified: Secondary | ICD-10-CM | POA: Diagnosis not present

## 2019-04-17 DIAGNOSIS — R739 Hyperglycemia, unspecified: Secondary | ICD-10-CM | POA: Diagnosis not present

## 2019-04-17 DIAGNOSIS — R600 Localized edema: Secondary | ICD-10-CM | POA: Diagnosis not present

## 2019-04-17 DIAGNOSIS — E669 Obesity, unspecified: Secondary | ICD-10-CM | POA: Diagnosis not present

## 2019-04-17 DIAGNOSIS — N183 Chronic kidney disease, stage 3 (moderate): Secondary | ICD-10-CM | POA: Diagnosis not present

## 2019-04-17 DIAGNOSIS — I129 Hypertensive chronic kidney disease with stage 1 through stage 4 chronic kidney disease, or unspecified chronic kidney disease: Secondary | ICD-10-CM | POA: Diagnosis not present

## 2019-04-23 DIAGNOSIS — Z961 Presence of intraocular lens: Secondary | ICD-10-CM | POA: Diagnosis not present

## 2019-04-23 DIAGNOSIS — H16103 Unspecified superficial keratitis, bilateral: Secondary | ICD-10-CM | POA: Diagnosis not present

## 2019-04-23 DIAGNOSIS — H04123 Dry eye syndrome of bilateral lacrimal glands: Secondary | ICD-10-CM | POA: Diagnosis not present

## 2019-04-23 DIAGNOSIS — H52202 Unspecified astigmatism, left eye: Secondary | ICD-10-CM | POA: Diagnosis not present

## 2019-05-12 ENCOUNTER — Other Ambulatory Visit: Payer: Self-pay | Admitting: Neurology

## 2019-05-12 DIAGNOSIS — M0589 Other rheumatoid arthritis with rheumatoid factor of multiple sites: Secondary | ICD-10-CM | POA: Diagnosis not present

## 2019-05-12 DIAGNOSIS — Z79899 Other long term (current) drug therapy: Secondary | ICD-10-CM | POA: Diagnosis not present

## 2019-06-02 DIAGNOSIS — M316 Other giant cell arteritis: Secondary | ICD-10-CM | POA: Diagnosis not present

## 2019-06-02 DIAGNOSIS — M5136 Other intervertebral disc degeneration, lumbar region: Secondary | ICD-10-CM | POA: Diagnosis not present

## 2019-06-02 DIAGNOSIS — M0589 Other rheumatoid arthritis with rheumatoid factor of multiple sites: Secondary | ICD-10-CM | POA: Diagnosis not present

## 2019-06-02 DIAGNOSIS — R5383 Other fatigue: Secondary | ICD-10-CM | POA: Diagnosis not present

## 2019-06-02 DIAGNOSIS — M150 Primary generalized (osteo)arthritis: Secondary | ICD-10-CM | POA: Diagnosis not present

## 2019-06-02 DIAGNOSIS — Z6825 Body mass index (BMI) 25.0-25.9, adult: Secondary | ICD-10-CM | POA: Diagnosis not present

## 2019-06-02 DIAGNOSIS — M79672 Pain in left foot: Secondary | ICD-10-CM | POA: Diagnosis not present

## 2019-06-02 DIAGNOSIS — M79645 Pain in left finger(s): Secondary | ICD-10-CM | POA: Diagnosis not present

## 2019-06-02 DIAGNOSIS — E663 Overweight: Secondary | ICD-10-CM | POA: Diagnosis not present

## 2019-06-02 DIAGNOSIS — J8410 Pulmonary fibrosis, unspecified: Secondary | ICD-10-CM | POA: Diagnosis not present

## 2019-06-02 DIAGNOSIS — M810 Age-related osteoporosis without current pathological fracture: Secondary | ICD-10-CM | POA: Diagnosis not present

## 2019-06-02 DIAGNOSIS — N184 Chronic kidney disease, stage 4 (severe): Secondary | ICD-10-CM | POA: Diagnosis not present

## 2019-07-01 ENCOUNTER — Ambulatory Visit (INDEPENDENT_AMBULATORY_CARE_PROVIDER_SITE_OTHER): Payer: Medicare Other | Admitting: Family Medicine

## 2019-07-01 ENCOUNTER — Other Ambulatory Visit: Payer: Self-pay

## 2019-07-01 ENCOUNTER — Telehealth: Payer: Self-pay | Admitting: Family Medicine

## 2019-07-01 ENCOUNTER — Encounter: Payer: Self-pay | Admitting: Family Medicine

## 2019-07-01 VITALS — BP 118/58 | HR 78 | Temp 98.2°F | Wt 142.5 lb

## 2019-07-01 DIAGNOSIS — G8929 Other chronic pain: Secondary | ICD-10-CM

## 2019-07-01 DIAGNOSIS — M545 Low back pain: Secondary | ICD-10-CM

## 2019-07-01 DIAGNOSIS — I10 Essential (primary) hypertension: Secondary | ICD-10-CM | POA: Diagnosis not present

## 2019-07-01 DIAGNOSIS — M810 Age-related osteoporosis without current pathological fracture: Secondary | ICD-10-CM | POA: Diagnosis not present

## 2019-07-01 NOTE — Telephone Encounter (Signed)
Copied from [place] [id number]. Topic: General - Inquiry >> Jul 01, 2019  4:05 PM Mathis Bud wrote: Reason for CRM: Patient is requesting a call back from nurse or pcp regarding her bone density test. Call back # [phone] >> Jul 01, 2019  4:11 PM Cox, Melburn Hake, CMA wrote: Pt needs to schedule bone density please.   Bone Density appointment has been scheduled

## 2019-07-01 NOTE — Progress Notes (Signed)
Subjective:     Patient ID: [name], female   DOB: 25-Aug-1941, 78 y.o.   MRN: [id number]  HPI [name] is here for medical follow-up.  She has chronic back pain and has been on low-dose OxyContin for many years and her pain is currently fairly well controlled.  She will need refills in about a week.  She has history of rheumatoid arthritis is maintained on low-dose prednisone.  She has hyperlipidemia, hypertension, osteoporosis.  Last DEXA scan was was 5 years ago.  She is on Prolia injections.  No recent falls.  Her chronic back pain is stable.  Denies medication side effects.    On Amlodipine for hypertension.  No dizziness.   Past Medical History:  Diagnosis Date  . Allergic rhinitis   . Anemia   . Anxiety   . Bronchiectasis   . Carpal tunnel syndrome 07/13/2015   Bilateral  . CVA (cerebral infarction) 10/2007   Right thalamic   . Diverticulosis of colon   . Gait disorder   . GERD (gastroesophageal reflux disease)   . History of breast cancer   . HTN (hypertension)   . Hyperlipidemia   . Idiopathic pulmonary fibrosis   . Left knee DJD   . Migraine   . Osteoporosis   . Peripheral neuropathy   . PMR (polymyalgia rheumatica) (HCC)   . Polyneuropathy in other diseases classified elsewhere ([place]) 02/17/2013  . Previous back surgery 10/09/12  . Renal insufficiency   . Rheumatoid arthritis ([place])   . Seizure disorder ([place])   . Spondylosis, cervical, with myelopathy 08/31/2015   C3-4 myelopathy  . Temporal arteritis (HCC)    Right eye blind, on steroids per [facility]/ Dr [name]  . Type II or unspecified type diabetes mellitus without mention of complication, not stated as uncontrolled    2nd to steriods  . Vitamin D deficiency    Past Surgical History:  Procedure Laterality Date  . CARPAL TUNNEL RELEASE Right   . CATARACT EXTRACTION     OS - Summer 2010  . CERVICAL FUSION  2010   4 rods and pins in place  . CHOLECYSTECTOMY    . COLONOSCOPY  12/15/2011   Procedure:  COLONOSCOPY;  Surgeon: [name], MD;  Location: [facility];  Service: Endoscopy;  Laterality: N/A;  . LUMBAR FUSION  04/2010   W/Mechanical fixation - [place]  2013   rods in hips (to stabilize).  Marland Kitchen MASTECTOMY    . NECK SURGERY    . rheumatoid nodule removal    . SPINAL FUSION  07/2010   T10-L2 interbody fusion / [facility]  . TONSILLECTOMY    . TOTAL KNEE ARTHROPLASTY     Left    reports that she is a non-smoker but has been exposed to tobacco smoke. She has never used smokeless tobacco. She reports that she does not drink alcohol or use drugs. family history includes Arthritis in her father and mother; Brain cancer in her mother; Breast cancer in her sister; Dementia in her father; Hypertension in her father and mother; Lung cancer in her sister. Allergies  Allergen Reactions  . Hydromorphone Other (See Comments)    Cognitive changes      Review of Systems  Constitutional: Negative for fatigue.  Eyes: Negative for visual disturbance.  Respiratory: Negative for cough, chest tightness, shortness of breath and wheezing.   Cardiovascular: Negative for chest pain, palpitations and leg swelling.  Endocrine: Negative for polydipsia and polyuria.  Musculoskeletal:  Positive for arthralgias and back pain.  Neurological: Negative for dizziness, seizures, syncope, weakness, light-headedness and headaches.       Objective:   Physical Exam Constitutional:      Appearance: Normal appearance.  Cardiovascular:     Rate and Rhythm: Normal rate and regular rhythm.  Pulmonary:     Comments: Crackles in both lung bases which are chronic Musculoskeletal:     Comments: Trace nonpitting edema legs bilaterally.  Left greater than right which is chronic  Neurological:     Mental Status: She is alert.        Assessment:     #1 chronic back pain overall stable  #2 hypertension well controlled  #3 osteoporosis.  Almost 5 years since last DEXA  scan    Plan:     -DEXA scan ordered and she will set this up -She will need refills of her chronic pain medicines in about 1 week -Flu vaccine already given -Patient will need labs at follow-up including chemistries, A1c, pain management profile.  We will schedule 3 month follow-up  Eulas Post MD [facility] at [facility]

## 2019-07-01 NOTE — Progress Notes (Signed)
PATIENT: [name] DOB: 02-14-1941  REASON FOR VISIT: follow up HISTORY FROM: patient  HISTORY OF PRESENT ILLNESS: Today 07/02/19  [name] is a 78 year old female with history of temporal arteritis, cervical myelopathy, peripheral neuropathy, and a left L5 radiculopathy in the past.  She has done relatively well since last seen.  She does have a callus on her left foot as result of a fallen arch that may require surgery.  She continues to use a rolling walker.  She has not had recent falls.  She does drive a car without difficulty.  She reports an occasional headache, otherwise has been doing well.  She remains on 4 mg of daily prednisone, in the past lower doses resulted in significant malaise.  She remains on gabapentin for peripheral neuropathy in her feet.  She will use Voltaren gel for neuropathy pain in her feet.  She does report about every 2 weeks she will have a night where she will have muscle spasms/cramps in her left leg that keeps her awake.  She is prescribed oxycodone for chronic back pain from her primary doctor.  She presents today for follow-up unaccompanied.  HISTORY 10/02/2018 Dr. [name]: [name] is a 78 year old right-handed white female with a history of temporal arteritis, cervical myelopathy, peripheral neuropathy, and a left L5 radiculopathy in the past.  The patient has been quite stable since last seen, she walks with a walker, she denies any falls.  She denies issues controlling the bowels or the bladder.  She does have discomfort in the feet associated with the peripheral neuropathy, she does not wish to go up on her gabapentin dosing.  She has a callus on the left foot that may require surgery in the near future.  She remains on 4 mg of prednisone daily, she had significant malaise with lower doses previously.  The patient otherwise denies any new significant issues.  REVIEW OF SYSTEMS: Out of a complete 14 system review of symptoms, the patient complains  only of the following symptoms, and all other reviewed systems are negative.  Neuropathy, foot pain, gait abnormality  ALLERGIES: Allergies  Allergen Reactions  . Hydromorphone Other (See Comments)    Cognitive changes  "made me unconscious"    HOME MEDICATIONS: Outpatient Medications Prior to Visit  Medication Sig Dispense Refill  . amLODipine (NORVASC) 5 MG tablet TAKE 1 TABLET DAILY 90 tablet 3  . Calcium Carb-Cholecalciferol (CALCIUM CARBONATE-VITAMIN D3 PO) Take 1 tablet by mouth daily    . cephALEXin (KEFLEX) 500 MG capsule     . cholecalciferol (VITAMIN D) 1000 UNITS tablet Take 1,000 Units by mouth daily.      Marland Kitchen denosumab (PROLIA) 60 MG/ML SOLN Inject 60 mg into the skin every 6 (six) months.      . diclofenac sodium (VOLTAREN) 1 % GEL APPLY 2 GRAMS TOPICALLY FOUR TIMES A DAY 400 g 6  . ferrous sulfate 325 (65 FE) MG tablet Take 325 mg by mouth 2 (two) times daily.     . furosemide (LASIX) 40 MG tablet TAKE 1 TABLET DAILY 90 tablet 4  . gabapentin (NEURONTIN) 300 MG capsule TAKE 2 CAPSULES TWICE A DAY 360 capsule 3  . Golimumab ([place] ARIA IV) Inject into the vein. Takes infusion every 8 wks.    . Multiple Vitamins-Minerals (MULTIVITAMIN,TX-MINERALS) tablet Take 1 tablet by mouth daily.      . MULTIPLE VITAMINS-MINERALS PO Take by mouth.    . oxyCODONE (OXYCONTIN) 10 mg 12 hr tablet Take 1  tablet (10 mg total) by mouth every 12 (twelve) hours. 60 tablet 0  . oxyCODONE (OXYCONTIN) 10 mg 12 hr tablet Take 1 tablet (10 mg total) by mouth every 12 (twelve) hours. Fill in one month 60 tablet 0  . oxyCODONE (OXYCONTIN) 10 mg 12 hr tablet Take 1 tablet (10 mg total) by mouth every 12 (twelve) hours. Fill in two months 60 tablet 0  . Oxycodone HCl 10 MG TABS Take 1 tablet (10 mg total) by mouth every 6 (six) hours as needed. 90 tablet 0  . potassium chloride (KLOR-CON M10) 10 MEQ tablet Take 1 tablet (10 mEq total) by mouth 2 (two) times daily. 180 tablet 3  . predniSONE (DELTASONE)  1 MG tablet TAKE 4 TABLETS DAILY WITH BREAKFAST 360 tablet 3  . simvastatin (ZOCOR) 5 MG tablet TAKE 1 TABLET AT BEDTIME 90 tablet 3   No facility-administered medications prior to visit.     PAST MEDICAL HISTORY: Past Medical History:  Diagnosis Date  . Allergic rhinitis   . Anemia   . Anxiety   . Bronchiectasis   . Carpal tunnel syndrome 07/13/2015   Bilateral  . CVA (cerebral infarction) 10/2007   Right thalamic   . Diverticulosis of colon   . Gait disorder   . GERD (gastroesophageal reflux disease)   . History of breast cancer   . HTN (hypertension)   . Hyperlipidemia   . Idiopathic pulmonary fibrosis   . Left knee DJD   . Migraine   . Osteoporosis   . Peripheral neuropathy   . PMR (polymyalgia rheumatica) (HCC)   . Polyneuropathy in other diseases classified elsewhere ([place]) 02/17/2013  . Previous back surgery 10/09/12  . Renal insufficiency   . Rheumatoid arthritis ([place])   . Seizure disorder ([place])   . Spondylosis, cervical, with myelopathy 08/31/2015   C3-4 myelopathy  . Temporal arteritis (HCC)    Right eye blind, on steroids per [facility]/ Dr [name]  . Type II or unspecified type diabetes mellitus without mention of complication, not stated as uncontrolled    2nd to steriods  . Vitamin D deficiency     PAST SURGICAL HISTORY: Past Surgical History:  Procedure Laterality Date  . CARPAL TUNNEL RELEASE Right   . CATARACT EXTRACTION     OS - Summer 2010  . CERVICAL FUSION  2010   4 rods and pins in place  . CHOLECYSTECTOMY    . COLONOSCOPY  12/15/2011   Procedure: COLONOSCOPY;  Surgeon: [name], MD;  Location[facility];  Service: Endoscopy;  Laterality: N/A;  . LUMBAR FUSION  04/2010   W/Mechanical fixation - [place]  2013   rods in hips (to stabilize).  Marland Kitchen MASTECTOMY    . NECK SURGERY    . rheumatoid nodule removal    . SPINAL FUSION  07/2010   T10-L2 interbody fusion / [facility]  . TONSILLECTOMY    . TOTAL KNEE  ARTHROPLASTY     Left    FAMILY HISTORY: Family History  Problem Relation Age of Onset  . Brain cancer Mother   . Hypertension Mother   . Arthritis Mother   . Dementia Father   . Hypertension Father   . Arthritis Father   . Breast cancer Sister   . Lung cancer Sister   . Lung disease Neg Hx   . Rheumatologic disease Neg Hx     SOCIAL HISTORY: Social History   Socioeconomic History  . Marital status: Widowed  Spouse name: Not on file  . Number of children: 1  . Years of education: 12+ coll.  . Highest education level: Not on file  Occupational History  . 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 Financial resource strain: Not on file  . Food insecurity    Worry: Not on file    Inability: Not on file  . Transportation needs    Medical: Not on file    Non-medical: Not on file  Tobacco Use  . Smoking status: Passive Smoke Exposure - Never Smoker  . Smokeless tobacco: Never Used  . Tobacco comment: Father   Substance and Sexual Activity  . Alcohol use: No    Alcohol/week: 0.0 standard drinks    Comment: heavy drinker until 1995 - sobriety with AA  . Drug use: No  . Sexual activity: Not Currently  Lifestyle  . Physical activity    Days per week: Not on file    Minutes per session: Not on file  . Stress: Not on file  Relationships  . Social Herbalist on phone: Not on file    Gets together: Not on file    Attends religious service: Not on file    Active member of club or organization: Not on file    Attends meetings of clubs or organizations: Not on file    Relationship status: Not on file  . Intimate partner violence    Fear of current or ex partner: Not on file    Emotionally abused: Not on file    Physically abused: Not on file    Forced sexual activity: Not on file  Other Topics Concern  . Not on file  Social History Narrative      Married - husband invalid with  stroke - widowed in 2009. 1 chil. 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 ACP - not formerly discussed - wishes to be a full code.    Patient is right handed. Patient consumes tea 4  times daily.         Patient drinks about 4-5 cups of caffeine daily.   Patient is right handed.       [facility] (04/25/17):   Originally from [place], [place]. Previously lived in [place]. 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 No bird or mold exposure. No hot tub exposure. Has traveled to [place], [place], and [place] No pets currently. Enjoys going to the beach.       PHYSICAL EXAM  Vitals:   07/02/19 1000  BP: 136/76  Pulse: 81  Temp: 97.8 F (36.6 C)  Weight: 141 lb 6.4 oz (64.1 kg)  Height: 5\' 1"  (1.549 m)   Body mass index is 26.72 kg/m.  Generalized: Well developed, in no acute distress   Neurological examination  Mentation: Alert oriented to time, place, history taking. Follows all commands speech and language fluent Cranial nerve II-XII: Pupils were equal round reactive to light. Extraocular movements were full, visual field were full on confrontational test. Facial sensation and strength were normal. Head turning and shoulder shrug  were normal and symmetric. Motor: The motor testing reveals 5 over 5 strength of all 4 extremities. Good symmetric motor tone is noted throughout.  Sensory: Sensory testing is intact to soft touch on all 4 extremities. No evidence of extinction is noted.  Coordination:  Cerebellar testing reveals good finger-nose-finger and heel-to-shin bilaterally.  Gait and station: Has to push off from seated position, limping type gait on the left, uses rolling walker, has good stability with a walker Reflexes: Deep tendon reflexes are symmetric   DIAGNOSTIC DATA (LABS, IMAGING, TESTING) - I reviewed patient records, labs, notes, testing and imaging myself where available.   Lab Results  Component Value Date   WBC 7.4 06/25/2018   HGB 13.0 06/25/2018   HCT 39 06/25/2018   MCV 93.2 04/25/2017   PLT 226 06/25/2018      Component Value Date/Time   NA 143 06/25/2018   K 3.5 06/25/2018   CL 99 04/25/2017 1017   CO2 35 (H) 04/25/2017 1017   GLUCOSE 104 (H) 04/25/2017 1017   GLUCOSE 132 (H) 10/01/2006 1605   BUN 17 06/25/2018   CREATININE 1.2 (A) 06/25/2018   CREATININE 1.11 04/25/2017 1017   CALCIUM 10.1 04/25/2017 1017   PROT 7.1 04/25/2017 1017   ALBUMIN 4.0 04/25/2017 1017   AST 26 06/25/2018   ALT 20 06/25/2018   ALKPHOS 82 06/25/2018   BILITOT 0.8 04/25/2017 1017   GFRNONAA 34 (L) 09/04/2013 1559   GFRAA 40 (L) 09/04/2013 1559   Lab Results  Component Value Date   CHOL 147 04/03/2019   HDL 72.40 04/03/2019   LDLCALC 60 04/03/2019   TRIG 74.0 04/03/2019   CHOLHDL 2 04/03/2019   Lab Results  Component Value Date   HGBA1C 5.3 10/30/2017   Lab Results  Component Value Date   VITAMINB12 709 06/13/2015   Lab Results  Component Value Date   TSH 1.32 06/13/2015    ASSESSMENT AND PLAN 78 y.o. year old female  has a past medical history of Allergic rhinitis, Anemia, Anxiety, Bronchiectasis, Carpal tunnel syndrome (07/13/2015), CVA (cerebral infarction) (10/2007), Diverticulosis of colon, Gait disorder, GERD (gastroesophageal reflux disease), History of breast cancer, HTN (hypertension), Hyperlipidemia, Idiopathic pulmonary fibrosis, Left knee DJD, Migraine, Osteoporosis, Peripheral neuropathy, PMR (polymyalgia rheumatica) ([place]), Polyneuropathy in other diseases classified elsewhere ([place]) (02/17/2013), Previous back surgery (10/09/12), Renal insufficiency, Rheumatoid arthritis ([place]), Seizure disorder ([place]), Spondylosis, cervical, with myelopathy (08/31/2015), Temporal arteritis ([place]), Type II or unspecified type diabetes mellitus without mention of complication, not stated as uncontrolled, and Vitamin D deficiency. here with:  1.  Temporal  arteritis 2.  Peripheral neuropathy 3.  History of cervical myelopathy 4.  History of left L5 radiculopathy 5.  Gait disorder 6.  Rheumatoid arthritis  She has continued to do well since last seen.  She will remain on low-dose prednisone 4 mg daily and gabapentin.  She may try tizanidine 2 mg capsule, as needed in the evening for muscle spasms/cramps in her left leg.  She will return in 6 months or sooner if needed.  She appears to be doing relatively well currently.  I spent 15 minutes with the patient. 50% of this time was spent discussing her plan of care.  Butler Denmark, AGNP-C, DNP 07/02/2019, 10:16 AM [facility] [address] [place], [place] [address] [phone]

## 2019-07-01 NOTE — Patient Instructions (Signed)
Consider shingles vaccine (Shingrix) and check at pharmacy if interested.   Set up repeat DEXA at [facility].

## 2019-07-02 ENCOUNTER — Ambulatory Visit (INDEPENDENT_AMBULATORY_CARE_PROVIDER_SITE_OTHER): Payer: Medicare Other | Admitting: Neurology

## 2019-07-02 ENCOUNTER — Other Ambulatory Visit: Payer: Self-pay

## 2019-07-02 ENCOUNTER — Encounter: Payer: Self-pay | Admitting: Neurology

## 2019-07-02 VITALS — BP 136/76 | HR 81 | Temp 97.8°F | Ht 61.0 in | Wt 141.4 lb

## 2019-07-02 DIAGNOSIS — M316 Other giant cell arteritis: Secondary | ICD-10-CM

## 2019-07-02 DIAGNOSIS — M810 Age-related osteoporosis without current pathological fracture: Secondary | ICD-10-CM | POA: Diagnosis not present

## 2019-07-02 DIAGNOSIS — G63 Polyneuropathy in diseases classified elsewhere: Secondary | ICD-10-CM

## 2019-07-02 MED ORDER — TIZANIDINE HCL 2 MG PO CAPS: 2.0000 mg | ORAL_CAPSULE | ORAL | 1 refills | Status: DC | PRN

## 2019-07-02 NOTE — Patient Instructions (Signed)
1. You may continue prednisone, gabapentin, voltaren gel 2. You may try tizanidine at bedtime for muscle cramps  3. Return in 6 months

## 2019-07-02 NOTE — Progress Notes (Signed)
I have read the note, and I agree with the clinical assessment and plan.  Charles K Willis   

## 2019-07-07 ENCOUNTER — Other Ambulatory Visit: Payer: Self-pay | Admitting: Family Medicine

## 2019-07-07 DIAGNOSIS — M0589 Other rheumatoid arthritis with rheumatoid factor of multiple sites: Secondary | ICD-10-CM | POA: Diagnosis not present

## 2019-07-07 MED ORDER — OXYCODONE HCL ER 10 MG PO T12A: 10.0000 mg | EXTENDED_RELEASE_TABLET | Freq: Two times a day (BID) | ORAL | 0 refills | Status: DC

## 2019-07-07 MED ORDER — OXYCODONE HCL 10 MG PO TABS: 10.0000 mg | ORAL_TABLET | Freq: Four times a day (QID) | ORAL | 0 refills | Status: DC | PRN

## 2019-07-07 NOTE — Telephone Encounter (Signed)
Refills of her pain medications sent for 3 months.

## 2019-07-08 ENCOUNTER — Other Ambulatory Visit: Payer: Medicare Other

## 2019-07-08 DIAGNOSIS — Q828 Other specified congenital malformations of skin: Secondary | ICD-10-CM | POA: Diagnosis not present

## 2019-07-08 DIAGNOSIS — L851 Acquired keratosis [keratoderma] palmaris et plantaris: Secondary | ICD-10-CM | POA: Diagnosis not present

## 2019-07-21 DIAGNOSIS — L570 Actinic keratosis: Secondary | ICD-10-CM | POA: Diagnosis not present

## 2019-07-21 DIAGNOSIS — R202 Paresthesia of skin: Secondary | ICD-10-CM | POA: Diagnosis not present

## 2019-07-21 DIAGNOSIS — D229 Melanocytic nevi, unspecified: Secondary | ICD-10-CM | POA: Diagnosis not present

## 2019-07-22 ENCOUNTER — Ambulatory Visit (INDEPENDENT_AMBULATORY_CARE_PROVIDER_SITE_OTHER)
Admission: RE | Admit: 2019-07-22 | Discharge: 2019-07-22 | Disposition: A | Discharge status: Home / Self Care | Payer: Medicare Other | Source: Ambulatory Visit | Attending: Family Medicine | Admitting: Family Medicine

## 2019-07-22 ENCOUNTER — Other Ambulatory Visit: Payer: Self-pay

## 2019-07-22 DIAGNOSIS — M810 Age-related osteoporosis without current pathological fracture: Secondary | ICD-10-CM | POA: Diagnosis not present

## 2019-08-03 ENCOUNTER — Telehealth: Payer: Self-pay | Admitting: *Deleted

## 2019-08-03 NOTE — Telephone Encounter (Signed)
Copied from [place] [id number]. Topic: General - Call Back - No Documentation >> Aug 03, 2019 11:17 AM Erick Blinks wrote: Reason for CRM: Call back request regarding bone density exam. PCP request  Best contact: [phone]

## 2019-08-03 NOTE — Telephone Encounter (Signed)
Already spoken to pt about her bone density. Please see imaging note. Closing this encounter

## 2019-08-19 ENCOUNTER — Ambulatory Visit: Payer: Self-pay

## 2019-08-19 DIAGNOSIS — T84218A Breakdown (mechanical) of internal fixation device of other bones, initial encounter: Secondary | ICD-10-CM | POA: Diagnosis not present

## 2019-08-19 NOTE — Telephone Encounter (Signed)
If no weakness, numbness, or severe pain would observe for now.   If any of those symptoms noted and progressing would have to be evaluated per ER.

## 2019-08-19 NOTE — Telephone Encounter (Signed)
Incoming call from Patient  With a complaint that she heard  Aloud ''pop'  When bending over  In the kitchen.  Reports that it hurts when standing or sitting Patient inquiring  What shall she  Do?  Didn't  know if she should contact Dr. Harl Bowie who did all back surgeries or Dr.  Elease Hashimoto.  Patient expecting a  return call  from  PCP.   Pain comes and go. Denies any cardiac sx.   Or neurologic Sx.         Answer Assessment - Initial Assessment Questions 1. SYMPTOM: "What is the main symptom you are concerned about?" (e.g., weakness, numbness)     *No Answer* 2. ONSET: "When did this start?" (minutes, hours, days; while sleeping)      around 1100 this am 3. LAST NORMAL: "When was the last time you were normal (no symptoms)?"      befor 1100am 4. PATTERN "Does this come and go, or has it been constant since it started?"  "Is it present now?"     Constant hurts with movement.  5. CARDIAC SYMPTOMS: "Have you had any of the following symptoms: chest pain, difficulty breathing, palpitations?"      6. NEUROLOGIC SYMPTOMS: "Have you had any of the following symptoms: headache, dizziness, vision loss, double vision, changes in speech, unsteady on your feet?"     Pain when standing or sitting.    7. OTHER SYMPTOMS: "Do you have any other symptoms?"      8. PREGNANCY: "Is there any chance you are pregnant?" "When was your last menstrual period?"     na  Protocols used: NEUROLOGIC DEFICIT-A-AH

## 2019-08-19 NOTE — Telephone Encounter (Signed)
Called patient and gave her the message from Dr. Burchette. Patient verbalized an understanding. 

## 2019-08-19 NOTE — Telephone Encounter (Signed)
Please see message. °

## 2019-08-23 DIAGNOSIS — X58XXXA Exposure to other specified factors, initial encounter: Secondary | ICD-10-CM | POA: Diagnosis not present

## 2019-08-23 DIAGNOSIS — Y998 Other external cause status: Secondary | ICD-10-CM | POA: Diagnosis not present

## 2019-08-23 DIAGNOSIS — Z8679 Personal history of other diseases of the circulatory system: Secondary | ICD-10-CM | POA: Diagnosis not present

## 2019-08-23 DIAGNOSIS — T84498A Other mechanical complication of other internal orthopedic devices, implants and grafts, initial encounter: Secondary | ICD-10-CM | POA: Diagnosis not present

## 2019-08-23 DIAGNOSIS — N289 Disorder of kidney and ureter, unspecified: Secondary | ICD-10-CM | POA: Diagnosis not present

## 2019-08-23 DIAGNOSIS — J8410 Pulmonary fibrosis, unspecified: Secondary | ICD-10-CM | POA: Diagnosis not present

## 2019-08-23 DIAGNOSIS — Z8673 Personal history of transient ischemic attack (TIA), and cerebral infarction without residual deficits: Secondary | ICD-10-CM | POA: Diagnosis not present

## 2019-08-23 DIAGNOSIS — M545 Low back pain: Secondary | ICD-10-CM | POA: Diagnosis not present

## 2019-08-23 DIAGNOSIS — M47814 Spondylosis without myelopathy or radiculopathy, thoracic region: Secondary | ICD-10-CM | POA: Diagnosis not present

## 2019-08-23 DIAGNOSIS — M47816 Spondylosis without myelopathy or radiculopathy, lumbar region: Secondary | ICD-10-CM | POA: Diagnosis not present

## 2019-08-25 DIAGNOSIS — T84216D Breakdown (mechanical) of internal fixation device of vertebrae, subsequent encounter: Secondary | ICD-10-CM | POA: Diagnosis not present

## 2019-08-25 DIAGNOSIS — T84216A Breakdown (mechanical) of internal fixation device of vertebrae, initial encounter: Secondary | ICD-10-CM | POA: Diagnosis not present

## 2019-08-25 DIAGNOSIS — T84226A Displacement of internal fixation device of vertebrae, initial encounter: Secondary | ICD-10-CM | POA: Diagnosis not present

## 2019-09-02 ENCOUNTER — Other Ambulatory Visit: Payer: Self-pay

## 2019-09-02 ENCOUNTER — Telehealth (INDEPENDENT_AMBULATORY_CARE_PROVIDER_SITE_OTHER): Payer: Medicare Other | Admitting: Family Medicine

## 2019-09-02 DIAGNOSIS — M545 Low back pain, unspecified: Secondary | ICD-10-CM

## 2019-09-02 DIAGNOSIS — R0981 Nasal congestion: Secondary | ICD-10-CM | POA: Diagnosis not present

## 2019-09-02 DIAGNOSIS — R509 Fever, unspecified: Secondary | ICD-10-CM

## 2019-09-02 DIAGNOSIS — G8929 Other chronic pain: Secondary | ICD-10-CM

## 2019-09-02 DIAGNOSIS — Z03818 Encounter for observation for suspected exposure to other biological agents ruled out: Secondary | ICD-10-CM | POA: Diagnosis not present

## 2019-09-02 NOTE — Progress Notes (Signed)
This visit type was conducted due to national recommendations for restrictions regarding the COVID-19 pandemic in an effort to limit this patient's exposure and mitigate transmission in our community.   Virtual Visit via Video Note  I connected with [name] on 09/02/19 at 10:00 AM EST by a video enabled telemedicine application and verified that I am speaking with the correct person using two identifiers.  Location patient: home Location provider:work or home office Persons participating in the virtual visit: patient, provider  I discussed the limitations of evaluation and management by telemedicine and the availability of in person appointments. The patient expressed understanding and agreed to proceed.   HPI: [name] has had multiple back surgeries including 2 neck surgeries and 3 lumbar surgeries.  She states that 2 weeks ago she fell and for about a week had no pain whatsoever.  Then the Wednesday prior to Thanksgiving she was cooking in her kitchen.  She stooped over to get a pen and felt a loud "pop "in her lumbar area.  This was associated with some severe pain.  She had low back pain.  She went to an urgent care on Friday and states that one of her lateral rods had broken loose.  She then went to [facility] ER on Sunday after progressive pain and had additional x-rays including CAT scan.  She was told that she would need surgery and was seen by her surgeon on Monday.  She had had previous Covid testing last Thursday which is negative and she has been fairly isolated since then.  Yesterday she went to see anesthesiologist as part of her preop and reportedly had temperature 100.6 which is confirmed on 2 readings.  At that point her surgery was delayed.  Patient states she had monitor temperatures all last week and these were consistently in the 98 range.  When she got home yesterday her temperature was 98.7 and subsequent temperatures yesterday and today 98.4 and 98.3.  She has some  chronic intermittent chills but has not documented any fever at home.  She denies any dysuria, cough, sore throat, diarrhea, abdominal pain, headaches, loss of taste or smell, or any skin rashes.  Mild nasal congestion which is common for her.  No sick contacts.  She was told to follow-up with primary for further evaluation of her "fever ".  Patient does relate that when she had her temperature taken over at [facility] she was in a very hot room to the point of feeling uncomfortable.   ROS: See pertinent positives and negatives per HPI.  Past Medical History:  Diagnosis Date  . Allergic rhinitis   . Anemia   . Anxiety   . Bronchiectasis   . Carpal tunnel syndrome 07/13/2015   Bilateral  . CVA (cerebral infarction) 10/2007   Right thalamic   . Diverticulosis of colon   . Gait disorder   . GERD (gastroesophageal reflux disease)   . History of breast cancer   . HTN (hypertension)   . Hyperlipidemia   . Idiopathic pulmonary fibrosis   . Left knee DJD   . Migraine   . Osteoporosis   . Peripheral neuropathy   . PMR (polymyalgia rheumatica) (HCC)   . Polyneuropathy in other diseases classified elsewhere ([place]) 02/17/2013  . Previous back surgery 10/09/12  . Renal insufficiency   . Rheumatoid arthritis ([place])   . Seizure disorder ([place])   . Spondylosis, cervical, with myelopathy 08/31/2015   C3-4 myelopathy  . Temporal arteritis (HCC)    Right  eye blind, on steroids per [facility]/ Dr [name]  . Type II or unspecified type diabetes mellitus without mention of complication, not stated as uncontrolled    2nd to steriods  . Vitamin D deficiency     Past Surgical History:  Procedure Laterality Date  . CARPAL TUNNEL RELEASE Right   . CATARACT EXTRACTION     OS - Summer 2010  . CERVICAL FUSION  2010   4 rods and pins in place  . CHOLECYSTECTOMY    . COLONOSCOPY  12/15/2011   Procedure: COLONOSCOPY;  Surgeon: [name], MD;  Location: [facility];  Service: Endoscopy;  Laterality: N/A;   . LUMBAR FUSION  04/2010   W/Mechanical fixation - [place]  2013   rods in hips (to stabilize).  Marland Kitchen MASTECTOMY    . NECK SURGERY    . rheumatoid nodule removal    . SPINAL FUSION  07/2010   T10-L2 interbody fusion / [facility]  . TONSILLECTOMY    . TOTAL KNEE ARTHROPLASTY     Left    Family History  Problem Relation Age of Onset  . Brain cancer Mother   . Hypertension Mother   . Arthritis Mother   . Dementia Father   . Hypertension Father   . Arthritis Father   . Breast cancer Sister   . Lung cancer Sister   . Lung disease Neg Hx   . Rheumatologic disease Neg Hx     SOCIAL HX: Non-smoker.  Lives alone.   Current Outpatient Medications:  .  amLODipine (NORVASC) 5 MG tablet, TAKE 1 TABLET DAILY, Disp: 90 tablet, Rfl: 3 .  Calcium Carb-Cholecalciferol (CALCIUM CARBONATE-VITAMIN D3 PO), Take 1 tablet by mouth daily, Disp: , Rfl:  .  cephALEXin (KEFLEX) 500 MG capsule, , Disp: , Rfl:  .  cholecalciferol (VITAMIN D) 1000 UNITS tablet, Take 1,000 Units by mouth daily.  , Disp: , Rfl:  .  denosumab (PROLIA) 60 MG/ML SOLN, Inject 60 mg into the skin every 6 (six) months.  , Disp: , Rfl:  .  diclofenac sodium (VOLTAREN) 1 % GEL, APPLY 2 GRAMS TOPICALLY FOUR TIMES A DAY, Disp: 400 g, Rfl: 6 .  ferrous sulfate 325 (65 FE) MG tablet, Take 325 mg by mouth 2 (two) times daily. , Disp: , Rfl:  .  furosemide (LASIX) 40 MG tablet, TAKE 1 TABLET DAILY, Disp: 90 tablet, Rfl: 4 .  gabapentin (NEURONTIN) 300 MG capsule, TAKE 2 CAPSULES TWICE A DAY, Disp: 360 capsule, Rfl: 3 .  Golimumab (SIMPONI ARIA IV), Inject into the vein. Takes infusion every 8 wks., Disp: , Rfl:  .  Multiple Vitamins-Minerals (MULTIVITAMIN,TX-MINERALS) tablet, Take 1 tablet by mouth daily.  , Disp: , Rfl:  .  MULTIPLE VITAMINS-MINERALS PO, Take by mouth., Disp: , Rfl:  .  oxyCODONE (OXYCONTIN) 10 mg 12 hr tablet, Take 1 tablet (10 mg total) by mouth every 12 (twelve) hours., Disp: 60  tablet, Rfl: 0 .  oxyCODONE (OXYCONTIN) 10 mg 12 hr tablet, Take 1 tablet (10 mg total) by mouth every 12 (twelve) hours. Fill in one month, Disp: 60 tablet, Rfl: 0 .  oxyCODONE (OXYCONTIN) 10 mg 12 hr tablet, Take 1 tablet (10 mg total) by mouth every 12 (twelve) hours. Fill in two months, Disp: 60 tablet, Rfl: 0 .  Oxycodone HCl 10 MG TABS, Take 1 tablet (10 mg total) by mouth every 6 (six) hours as needed., Disp: 90 tablet, Rfl: 0 .  potassium chloride (  KLOR-CON M10) 10 MEQ tablet, Take 1 tablet (10 mEq total) by mouth 2 (two) times daily., Disp: 180 tablet, Rfl: 3 .  predniSONE (DELTASONE) 1 MG tablet, TAKE 4 TABLETS DAILY WITH BREAKFAST, Disp: 360 tablet, Rfl: 3 .  simvastatin (ZOCOR) 5 MG tablet, TAKE 1 TABLET AT BEDTIME, Disp: 90 tablet, Rfl: 3 .  tizanidine (ZANAFLEX) 2 MG capsule, Take 1 capsule (2 mg total) by mouth as needed for muscle spasms (take 1 at bedtime as needed for muscle spasms)., Disp: 20 capsule, Rfl: 1  EXAM:  VITALS per patient if applicable:  GENERAL: alert, oriented, appears well and in no acute distress  HEENT: atraumatic, conjunttiva clear, no obvious abnormalities on inspection of external nose and ears  NECK: normal movements of the head and neck  LUNGS: on inspection no signs of respiratory distress, breathing rate appears normal, no obvious gross SOB, gasping or wheezing  CV: no obvious cyanosis  MS: moves all visible extremities without noticeable abnormality  PSYCH/NEURO: pleasant and cooperative, no obvious depression or anxiety, speech and thought processing grossly intact  ASSESSMENT AND PLAN:  Discussed the following assessment and plan:  Fever, unspecified fever cause - Plan: Novel Coronavirus, NAA ([facility])  -She only had 1 elevated temperature over at [facility] but no confirmed fever prior to that time or since then.  She really has no significant symptoms to suggest active infection but is needing further clearance and reassurance before  rescheduling her surgery.  We have suggested the following  -Repeat Covid testing.  We discussed getting this at [facility] versus urgent care -Patient also wants to check further infection screening such as CBC and possibly urinalysis and therefore will go to urgent care for those given that she did have possible fever as recently as yesterday.  If those are all normal she should be safe to get rescheduled for surgery -In the meantime, we recommend she monitor her temperature closely and also be in touch for any new symptoms     I discussed the assessment and treatment plan with the patient. The patient was provided an opportunity to ask questions and all were answered. The patient agreed with the plan and demonstrated an understanding of the instructions.   The patient was advised to call back or seek an in-person evaluation if the symptoms worsen or if the condition fails to improve as anticipated.     Carolann Littler, MD

## 2019-09-10 ENCOUNTER — Telehealth: Payer: Self-pay | Admitting: Family Medicine

## 2019-09-10 NOTE — Telephone Encounter (Signed)
Message Routed to PCP CMA 

## 2019-09-10 NOTE — Telephone Encounter (Signed)
Pt stated she is having surgery on Monday and at her pre-op appt yesterday she was advised to follow up with Dr. Elease Hashimoto regarding the amount of iron she is taking because her magnesium levels were high. Pt would like to know if it is okay to take one iron tablet instead of two. Requesting cb. Please advise.

## 2019-09-14 DIAGNOSIS — Z79891 Long term (current) use of opiate analgesic: Secondary | ICD-10-CM | POA: Diagnosis not present

## 2019-09-14 DIAGNOSIS — N183 Chronic kidney disease, stage 3 unspecified: Secondary | ICD-10-CM | POA: Diagnosis present

## 2019-09-14 DIAGNOSIS — T84216A Breakdown (mechanical) of internal fixation device of vertebrae, initial encounter: Secondary | ICD-10-CM | POA: Diagnosis not present

## 2019-09-14 DIAGNOSIS — M960 Pseudarthrosis after fusion or arthrodesis: Secondary | ICD-10-CM | POA: Diagnosis not present

## 2019-09-14 DIAGNOSIS — Z853 Personal history of malignant neoplasm of breast: Secondary | ICD-10-CM | POA: Diagnosis not present

## 2019-09-14 DIAGNOSIS — E785 Hyperlipidemia, unspecified: Secondary | ICD-10-CM | POA: Diagnosis present

## 2019-09-14 DIAGNOSIS — M4180 Other forms of scoliosis, site unspecified: Secondary | ICD-10-CM | POA: Diagnosis present

## 2019-09-14 DIAGNOSIS — I129 Hypertensive chronic kidney disease with stage 1 through stage 4 chronic kidney disease, or unspecified chronic kidney disease: Secondary | ICD-10-CM | POA: Diagnosis present

## 2019-09-14 DIAGNOSIS — Z8673 Personal history of transient ischemic attack (TIA), and cerebral infarction without residual deficits: Secondary | ICD-10-CM | POA: Diagnosis not present

## 2019-09-14 NOTE — Telephone Encounter (Signed)
Please see message.  Please advise. 

## 2019-09-14 NOTE — Telephone Encounter (Signed)
Called patient and LMOVM to return call  [place] for [facility] to Discuss results / PCP / recommendations / Schedule patient  Per Dr. Elease Hashimoto: Yes.  I'm not sure why she needs to be on any iron- but see no reason not to decrease.  Left a detailed message on VM.  CRM Created.

## 2019-09-14 NOTE — Telephone Encounter (Signed)
Yes.  I'm not sure why she needs to be on any iron- but see no reason not to decrease.

## 2019-09-16 MED ORDER — GABAPENTIN 400 MG PO CAPS
400.00 | ORAL_CAPSULE | ORAL | Status: DC
Start: 2019-09-16 — End: 2019-09-16

## 2019-09-16 MED ORDER — POTASSIUM CHLORIDE CRYS ER 10 MEQ PO TBCR
10.00 | EXTENDED_RELEASE_TABLET | ORAL | Status: DC
Start: 2019-09-17 — End: 2019-09-16

## 2019-09-16 MED ORDER — POLYETHYLENE GLYCOL 3350 17 GM/SCOOP PO POWD
17.00 | ORAL | Status: DC
Start: 2019-09-17 — End: 2019-09-16

## 2019-09-16 MED ORDER — FUROSEMIDE 20 MG PO TABS
40.00 | ORAL_TABLET | ORAL | Status: DC
Start: 2019-09-17 — End: 2019-09-16

## 2019-09-16 MED ORDER — DOCUSATE SODIUM 283 MG RE ENEM
283.00 | ENEMA | RECTAL | Status: DC
Start: ? — End: 2019-09-16

## 2019-09-16 MED ORDER — ONDANSETRON HCL 4 MG/2ML IJ SOLN
4.00 | INTRAMUSCULAR | Status: DC
Start: ? — End: 2019-09-16

## 2019-09-16 MED ORDER — LIDOCAINE 4 % EX PTCH
1.00 | MEDICATED_PATCH | CUTANEOUS | Status: DC
Start: ? — End: 2019-09-16

## 2019-09-16 MED ORDER — PREDNISONE 1 MG PO TABS
4.00 | ORAL_TABLET | ORAL | Status: DC
Start: 2019-09-17 — End: 2019-09-16

## 2019-09-16 MED ORDER — BISACODYL 10 MG RE SUPP
10.00 | RECTAL | Status: DC
Start: ? — End: 2019-09-16

## 2019-09-16 MED ORDER — KETAMINE HCL 10 MG/ML IJ SOLN
10.00 | INTRAMUSCULAR | Status: DC
Start: 2019-09-16 — End: 2019-09-16

## 2019-09-16 MED ORDER — ATORVASTATIN CALCIUM 10 MG PO TABS
10.00 | ORAL_TABLET | ORAL | Status: DC
Start: 2019-09-16 — End: 2019-09-16

## 2019-09-16 MED ORDER — OXYCODONE HCL ER 10 MG PO T12A
10.00 | EXTENDED_RELEASE_TABLET | ORAL | Status: DC
Start: 2019-09-16 — End: 2019-09-16

## 2019-09-16 MED ORDER — ACETAMINOPHEN 325 MG PO TABS
650.00 | ORAL_TABLET | ORAL | Status: DC
Start: 2019-09-16 — End: 2019-09-16

## 2019-09-16 MED ORDER — METHOCARBAMOL 500 MG PO TABS
500.00 | ORAL_TABLET | ORAL | Status: DC
Start: 2019-09-16 — End: 2019-09-16

## 2019-09-16 MED ORDER — CHOLECALCIFEROL 25 MCG (1000 UT) PO TABS
1000.00 | ORAL_TABLET | ORAL | Status: DC
Start: 2019-09-17 — End: 2019-09-16

## 2019-09-16 MED ORDER — AMLODIPINE BESYLATE 5 MG PO TABS
5.00 | ORAL_TABLET | ORAL | Status: DC
Start: 2019-09-17 — End: 2019-09-16

## 2019-09-16 MED ORDER — OXYCODONE HCL 5 MG PO TABS
5.00 | ORAL_TABLET | ORAL | Status: DC
Start: ? — End: 2019-09-16

## 2019-09-16 MED ORDER — SENNOSIDES-DOCUSATE SODIUM 8.6-50 MG PO TABS
2.00 | ORAL_TABLET | ORAL | Status: DC
Start: 2019-09-16 — End: 2019-09-16

## 2019-09-16 MED ORDER — FERROUS SULFATE 325 (65 FE) MG PO TABS
325.00 | ORAL_TABLET | ORAL | Status: DC
Start: 2019-09-16 — End: 2019-09-16

## 2019-10-02 ENCOUNTER — Telehealth: Payer: Self-pay | Admitting: *Deleted

## 2019-10-02 NOTE — Telephone Encounter (Signed)
Copied from [place] [id number]. Topic: General - Other >> Oct 02, 2019 11:41 AM [name] Rack wrote: Reason for CRM: Pt stated she was told to contact her pcp to have the staples removed from her back prior to her appt with the surgeon on Tuesday 10/06/19. Attempted to transfer pt to the office but there was no answer. Pt requests call back to schedule appt to have staples removed either today 10/02/19 or Monday 10/05/19

## 2019-10-04 NOTE — Telephone Encounter (Signed)
She sees Dr. Elease Hashimoto

## 2019-10-05 NOTE — Telephone Encounter (Signed)
Called patient and she stated that she has rescheduled her post-op with her surgeon for Friday and she is going to have staples and stitches out at that time. Patient verbalized an understanding.

## 2019-10-06 ENCOUNTER — Ambulatory Visit: Payer: Medicare Other | Admitting: Family Medicine

## 2019-10-09 ENCOUNTER — Other Ambulatory Visit: Payer: Self-pay

## 2019-10-09 DIAGNOSIS — Z981 Arthrodesis status: Secondary | ICD-10-CM | POA: Diagnosis not present

## 2019-10-09 DIAGNOSIS — M4312 Spondylolisthesis, cervical region: Secondary | ICD-10-CM | POA: Diagnosis not present

## 2019-10-09 DIAGNOSIS — M4313 Spondylolisthesis, cervicothoracic region: Secondary | ICD-10-CM | POA: Diagnosis not present

## 2019-10-09 DIAGNOSIS — T84216A Breakdown (mechanical) of internal fixation device of vertebrae, initial encounter: Secondary | ICD-10-CM | POA: Diagnosis not present

## 2019-10-09 DIAGNOSIS — M8588 Other specified disorders of bone density and structure, other site: Secondary | ICD-10-CM | POA: Diagnosis not present

## 2019-10-09 DIAGNOSIS — Z9889 Other specified postprocedural states: Secondary | ICD-10-CM | POA: Diagnosis not present

## 2019-10-12 ENCOUNTER — Ambulatory Visit (INDEPENDENT_AMBULATORY_CARE_PROVIDER_SITE_OTHER): Payer: Medicare Other | Admitting: Family Medicine

## 2019-10-12 ENCOUNTER — Other Ambulatory Visit: Payer: Self-pay

## 2019-10-12 ENCOUNTER — Encounter: Payer: Self-pay | Admitting: Family Medicine

## 2019-10-12 VITALS — BP 128/80 | HR 82 | Temp 98.3°F | Ht 61.0 in | Wt 137.7 lb

## 2019-10-12 DIAGNOSIS — R1010 Upper abdominal pain, unspecified: Secondary | ICD-10-CM | POA: Diagnosis not present

## 2019-10-12 DIAGNOSIS — K5900 Constipation, unspecified: Secondary | ICD-10-CM

## 2019-10-12 DIAGNOSIS — M545 Low back pain: Secondary | ICD-10-CM

## 2019-10-12 DIAGNOSIS — Z1231 Encounter for screening mammogram for malignant neoplasm of breast: Secondary | ICD-10-CM | POA: Diagnosis not present

## 2019-10-12 DIAGNOSIS — G8929 Other chronic pain: Secondary | ICD-10-CM

## 2019-10-12 MED ORDER — OXYCODONE HCL ER 10 MG PO T12A: 10.0000 mg | EXTENDED_RELEASE_TABLET | Freq: Two times a day (BID) | ORAL | 0 refills | Status: DC

## 2019-10-12 NOTE — Progress Notes (Signed)
Subjective:     Patient ID: [name], female   DOB: 11-16-1940, 79 y.o.   MRN: [id number]  HPI [name] is seen for chronic back pain management.  She had 2 titanium rods that broke in her back back in the fall and she ended up having stabilization surgery December 21 over at [facility].  Her surgery went very well.  She was discharged 2 days later and is much improved at this time overall.  She did receive some oxycodone 5 mg for acute breakthrough pain after her surgery but has been maintained for the most part on her 10 mg extended release oxycodone which she has been on for several years.  She has some constipation intermittently.  She has taken MiraLAX in the past without much success.  She has recently been eating prunes and drinking apple juice which seems to help.  Pain is currently fairly well controlled.  She does have some constipation type issues.  She had a separate type of pain recently about a month ago.  She described a very sharp right upper quadrant pain that radiated toward the back.  This is very fleeting.  She discussed this with her neurosurgeon and they felt this only some type of nerve pain.  She had follow-up postoperative x-rays which showed hardware to be in good place.  She had only one brief episode earlier today of similar pain.  No associated nausea or vomiting.  No fevers or chills.  No cough.  No dyspnea.  No pleuritic pain.  She has had previous cholecystectomy  Past Medical History:  Diagnosis Date  . Allergic rhinitis   . Anemia   . Anxiety   . Bronchiectasis   . Carpal tunnel syndrome 07/13/2015   Bilateral  . CVA (cerebral infarction) 10/2007   Right thalamic   . Diverticulosis of colon   . Gait disorder   . GERD (gastroesophageal reflux disease)   . History of breast cancer   . HTN (hypertension)   . Hyperlipidemia   . Idiopathic pulmonary fibrosis   . Left knee DJD   . Migraine   . Osteoporosis   . Peripheral neuropathy   . PMR  (polymyalgia rheumatica) (HCC)   . Polyneuropathy in other diseases classified elsewhere ([place]) 02/17/2013  . Previous back surgery 10/09/12  . Renal insufficiency   . Rheumatoid arthritis ([place])   . Seizure disorder ([place])   . Spondylosis, cervical, with myelopathy 08/31/2015   C3-4 myelopathy  . Temporal arteritis (HCC)    Right eye blind, on steroids per [facility]/ Dr [name]  . Type II or unspecified type diabetes mellitus without mention of complication, not stated as uncontrolled    2nd to steriods  . Vitamin D deficiency    Past Surgical History:  Procedure Laterality Date  . CARPAL TUNNEL RELEASE Right   . CATARACT EXTRACTION     OS - Summer 2010  . CERVICAL FUSION  2010   4 rods and pins in place  . CHOLECYSTECTOMY    . COLONOSCOPY  12/15/2011   Procedure: COLONOSCOPY;  Surgeon: [name], MD;  Location: [facility];  Service: Endoscopy;  Laterality: N/A;  . LUMBAR FUSION  04/2010   W/Mechanical fixation - [place]  2013   rods in hips (to stabilize).  Marland Kitchen MASTECTOMY    . NECK SURGERY    . rheumatoid nodule removal    . SPINAL FUSION  07/2010   T10-L2 interbody fusion / [facility]  .  TONSILLECTOMY    . TOTAL KNEE ARTHROPLASTY     Left    reports that she is a non-smoker but has been exposed to tobacco smoke. She has never used smokeless tobacco. She reports that she does not drink alcohol or use drugs. family history includes Arthritis in her father and mother; Brain cancer in her mother; Breast cancer in her sister; Dementia in her father; Hypertension in her father and mother; Lung cancer in her sister. Allergies  Allergen Reactions  . Hydromorphone Other (See Comments)    Cognitive changes  "made me unconscious"     Review of Systems  Constitutional: Negative for appetite change, chills, fever and unexpected weight change.  Respiratory: Negative for cough and shortness of breath.   Cardiovascular: Negative for chest pain.   Gastrointestinal: Positive for constipation. Negative for abdominal pain, nausea and vomiting.  Genitourinary: Negative for dysuria.  Musculoskeletal: Positive for back pain.  Skin: Negative for rash.       Objective:   Physical Exam Vitals reviewed.  Constitutional:      Appearance: Normal appearance.  Cardiovascular:     Rate and Rhythm: Normal rate and regular rhythm.  Pulmonary:     Effort: Pulmonary effort is normal.     Breath sounds: Normal breath sounds.  Neurological:     Mental Status: She is alert.        Assessment:     #1 chronic cervical, thoracolumbar back pain.  Recent stabilization surgery for broken titanium rods as above  #2 constipation probably related to chronic opioid use  #3 recent intermittent sharp quality fleeting right upper quadrant pain.  This sounded more neuropathic in nature    Plan:     -Refill OxyContin extended release 10 mg every 12 hours for the next 3 months  -Urine for pain management profile to be sent  -Discussed measures to help reduce constipation.  She will consider daily stool softener if needed if not relieved with natural remedies  -56-month follow-up  Eulas Post MD [facility] at [facility]

## 2019-10-13 ENCOUNTER — Telehealth: Payer: Self-pay

## 2019-10-13 NOTE — Telephone Encounter (Signed)
Called patient and left a detailed voice message to apologize to the patient and ask her to call us to make a lab appointment so she can leave a urine sample.  OK for [facility] to discuss and schedule a lab appointment for this patient.  CRM Created.

## 2019-10-15 ENCOUNTER — Other Ambulatory Visit: Payer: Self-pay

## 2019-10-15 ENCOUNTER — Other Ambulatory Visit: Payer: Medicare Other

## 2019-10-15 ENCOUNTER — Telehealth: Payer: Self-pay | Admitting: Family Medicine

## 2019-10-15 DIAGNOSIS — G8929 Other chronic pain: Secondary | ICD-10-CM | POA: Diagnosis not present

## 2019-10-15 DIAGNOSIS — M545 Low back pain: Secondary | ICD-10-CM | POA: Diagnosis not present

## 2019-10-15 NOTE — Telephone Encounter (Signed)
Pt is returning your call please call her back

## 2019-10-16 NOTE — Telephone Encounter (Signed)
Patient came back to the office to give urine sample to have it sent out to the lab. Nothing further needed at this time.

## 2019-10-18 LAB — PAIN MGMT, PROFILE 8 W/CONF, U
6 Acetylmorphine: NEGATIVE ng/mL
Alcohol Metabolites: NEGATIVE ng/mL (ref <500)
Amphetamines: NEGATIVE ng/mL
Benzodiazepines: NEGATIVE ng/mL
Buprenorphine, Urine: NEGATIVE ng/mL
Cocaine Metabolite: NEGATIVE ng/mL
Codeine: NEGATIVE ng/mL
Creatinine: 57.7 mg/dL
Hydrocodone: NEGATIVE ng/mL
Hydromorphone: NEGATIVE ng/mL
MDMA: NEGATIVE ng/mL
Marijuana Metabolite: NEGATIVE ng/mL
Morphine: NEGATIVE ng/mL
Norhydrocodone: NEGATIVE ng/mL
Noroxycodone: 2519 ng/mL
Opiates: NEGATIVE ng/mL
Oxidant: NEGATIVE ug/mL
Oxycodone: 1031 ng/mL
Oxycodone: POSITIVE ng/mL
Oxymorphone: 1079 ng/mL
pH: 6.6 (ref 4.5–9.0)

## 2019-10-19 ENCOUNTER — Telehealth: Payer: Self-pay | Admitting: Family Medicine

## 2019-10-19 NOTE — Telephone Encounter (Signed)
Please see message. °

## 2019-10-19 NOTE — Telephone Encounter (Signed)
If she thinks nasal symptoms are allergic consider trial of Flonase if she is not already taking this. We sent in 70-month prescription for her OxyContin 10 mg every 12 hours on the 18th so she should have refills at the pharmacy already

## 2019-10-19 NOTE — Telephone Encounter (Signed)
Right ear stopped up, runny nose and when she swallows her ear pops. She wants to know if [facility] can call her in something or is there something that she can take over the counter?  She also needs her oxyCODONE (OXYCONTIN) 10 mg 12 hr tablet refilled she has about 5 left.   Please advise  [facility] [phone] [address], [address] AT [address] Phone:  [phone]  Fax:  [phone]

## 2019-10-20 MED ORDER — OXYCODONE HCL 10 MG PO TABS: 10.0000 mg | ORAL_TABLET | Freq: Four times a day (QID) | ORAL | 0 refills | Status: DC | PRN

## 2019-10-20 NOTE — Telephone Encounter (Signed)
The patient said that Dr. Elease Hashimoto called in her  oxyCODONE (OXYCONTIN) 10 mg 12 hr tablet   She needs [facility] to call in her   Oxycodone HCl 10 MG TABS  Send to:  [facility] [place], [place] AT [address] Phone:  [phone]  Fax:  [phone]

## 2019-10-20 NOTE — Telephone Encounter (Signed)
Called patient and gave her the message.  Patient stated that she is not able to take any thing in her nose. Patient is asking for something in a tablet or capsule form. She stated that it is getting worse and she would really like to take something today.  Please advise.

## 2019-10-20 NOTE — Telephone Encounter (Signed)
See new message

## 2019-10-21 NOTE — Telephone Encounter (Signed)
Called patient and she stated that she has not tried either of these because she did not know if she could take. Please advise.

## 2019-10-21 NOTE — Telephone Encounter (Signed)
Zyrtec 10mg once daily.

## 2019-10-21 NOTE — Telephone Encounter (Signed)
Patient is very concerned because she really needs something for her ear and nose in a capsule/tablet form. She is asking for help. Please see messages and advise. Thank you!

## 2019-10-21 NOTE — Telephone Encounter (Signed)
Has she tried Zyrtec or Claritan?

## 2019-10-21 NOTE — Telephone Encounter (Signed)
Called patient and LMOVM to return call  [place] for [facility] to Discuss results / PCP / recommendations / Schedule patient  Per Dr. Elease Hashimoto: Zyrtec 10 mg once daily

## 2019-10-23 ENCOUNTER — Other Ambulatory Visit: Payer: Self-pay | Admitting: Family Medicine

## 2019-10-26 ENCOUNTER — Ambulatory Visit: Payer: Medicare Other

## 2019-10-28 ENCOUNTER — Telehealth: Payer: Self-pay | Admitting: Family Medicine

## 2019-10-28 NOTE — Telephone Encounter (Signed)
Called patient and LMOVM to return call  [place] for [facility] to Discuss results / PCP / recommendations / Schedule patient  I left a detailed voice message on answering machine for the message from Dr. Elease Hashimoto.

## 2019-10-28 NOTE — Telephone Encounter (Signed)
Patient stated that she is not able to take anything in her nose. Please advise.

## 2019-10-28 NOTE — Telephone Encounter (Signed)
May take Mucinex 1,200 mg bid

## 2019-10-28 NOTE — Telephone Encounter (Signed)
Pt states that the Zyrtec that Dr. Elease Hashimoto recommended has not helped and she is still expiercing her ears pop when she swallows. Pt is wondering if there is something else she can take to help.  Pt can be reached at [phone]

## 2019-10-28 NOTE — Telephone Encounter (Signed)
Please see message. °

## 2019-10-28 NOTE — Telephone Encounter (Signed)
If she has an effusion this can take several weeks or even months to resolve.  Unfortunately, medications have not been shown to be very effective.  Would not recommend decongestants because of risk of side effects.  Would also try to avoid prednisone.  She may have already tried Flonase or Nasacort that if she has not that would be worth trying at least

## 2019-11-01 ENCOUNTER — Ambulatory Visit: Payer: Medicare Other

## 2019-11-07 ENCOUNTER — Ambulatory Visit: Payer: Medicare Other | Attending: Internal Medicine

## 2019-11-07 DIAGNOSIS — Z23 Encounter for immunization: Secondary | ICD-10-CM | POA: Insufficient documentation

## 2019-11-07 NOTE — Progress Notes (Signed)
   Covid-19 Vaccination Clinic  Name:  [name]    MRN: [id number] DOB: 1941/05/12  11/07/2019  [name] was observed post Covid-19 immunization for 15 minutes without incidence. She was provided with Vaccine Information Sheet and instruction to access the V-Safe system.   [name] was instructed to call 911 with any severe reactions post vaccine: Marland Kitchen Difficulty breathing  . Swelling of your face and throat  . A fast heartbeat  . A bad rash all over your body  . Dizziness and weakness    Immunizations Administered    Name Date Dose VIS Date Route   Pfizer COVID-19 Vaccine 11/07/2019  8:13 AM 0.3 mL 09/04/2019 Intramuscular   Manufacturer: [place]   Lot: [id number]   [place]: [address]-1

## 2019-11-09 ENCOUNTER — Telehealth: Payer: Self-pay | Admitting: Family Medicine

## 2019-11-09 NOTE — Telephone Encounter (Signed)
Pt would like a recommended Ear Nose & Throat dr because she not getting any better. Pt would like a call back.   Patient OQXLL:[phone]

## 2019-11-09 NOTE — Telephone Encounter (Signed)
OK to set up ENT (bilateral ear fullness)

## 2019-11-09 NOTE — Telephone Encounter (Signed)
OK to refer.

## 2019-11-10 ENCOUNTER — Other Ambulatory Visit: Payer: Self-pay

## 2019-11-10 ENCOUNTER — Telehealth (INDEPENDENT_AMBULATORY_CARE_PROVIDER_SITE_OTHER): Payer: Medicare Other | Admitting: Family Medicine

## 2019-11-10 DIAGNOSIS — H938X3 Other specified disorders of ear, bilateral: Secondary | ICD-10-CM | POA: Diagnosis not present

## 2019-11-10 NOTE — Telephone Encounter (Signed)
Called patient to let her know that I have placed the referral.  Patient is concerned that she may loose her hearing. Patient has fullness and popping in ears. Patient has some nasal drip and the Zyrtec helped some with nasal drip. Patient is asking if she can have antibiotic or decongestant.  I have scheduled telephone appt today at 1:15pm. Sending as Juluis Rainier

## 2019-11-10 NOTE — Progress Notes (Signed)
.This visit type was conducted due to national recommendations for restrictions regarding the COVID-19 pandemic in an effort to limit this patient's exposure and mitigate transmission in our community.   Virtual Visit via Telephone Note  I connected with [name] on 11/10/19 at  1:15 PM EST by telephone and verified that I am speaking with the correct person using two identifiers.   I discussed the limitations, risks, security and privacy concerns of performing an evaluation and management service by telephone and the availability of in person appointments. I also discussed with the patient that there may be a patient responsible charge related to this service. The patient expressed understanding and agreed to proceed.  Location patient: home Location provider: work or home office Participants present for the call: patient, provider Patient did not have a visit in the prior 7 days to address this/these issue(s).   History of Present Illness:  [name] has had several weeks now of bilateral ear fullness and frequent sensation of "popping ".  She denies any hearing changes.  She has had some nasal congestion and she thinks this is probably allergy related.  She is taking Zyrtec and also tried some Mucinex without improvement.  No fever.  No ear pain.  She denies any sinus pain or any headaches.  No significant tinnitus.  No vertigo.  Past Medical History:  Diagnosis Date  . Allergic rhinitis   . Anemia   . Anxiety   . Bronchiectasis   . Carpal tunnel syndrome 07/13/2015   Bilateral  . CVA (cerebral infarction) 10/2007   Right thalamic   . Diverticulosis of colon   . Gait disorder   . GERD (gastroesophageal reflux disease)   . History of breast cancer   . HTN (hypertension)   . Hyperlipidemia   . Idiopathic pulmonary fibrosis   . Left knee DJD   . Migraine   . Osteoporosis   . Peripheral neuropathy   . PMR (polymyalgia rheumatica) (HCC)   . Polyneuropathy in other diseases  classified elsewhere ([place]) 02/17/2013  . Previous back surgery 10/09/12  . Renal insufficiency   . Rheumatoid arthritis ([place])   . Seizure disorder ([place])   . Spondylosis, cervical, with myelopathy 08/31/2015   C3-4 myelopathy  . Temporal arteritis (HCC)    Right eye blind, on steroids per [facility]/ Dr [name]  . Type II or unspecified type diabetes mellitus without mention of complication, not stated as uncontrolled    2nd to steriods  . Vitamin D deficiency    Past Surgical History:  Procedure Laterality Date  . CARPAL TUNNEL RELEASE Right   . CATARACT EXTRACTION     OS - Summer 2010  . CERVICAL FUSION  2010   4 rods and pins in place  . CHOLECYSTECTOMY    . COLONOSCOPY  12/15/2011   Procedure: COLONOSCOPY;  Surgeon: [name], MD;  [facility];  Service: Endoscopy;  Laterality: N/A;  . LUMBAR FUSION  04/2010   W/Mechanical fixation - [place]  2013   rods in hips (to stabilize).  Marland Kitchen MASTECTOMY    . NECK SURGERY    . rheumatoid nodule removal    . SPINAL FUSION  07/2010   T10-L2 interbody fusion / [facility]  . TONSILLECTOMY    . TOTAL KNEE ARTHROPLASTY     Left    reports that she is a non-smoker but has been exposed to tobacco smoke. She has never used smokeless tobacco. She reports that she  does not drink alcohol or use drugs. family history includes Arthritis in her father and mother; Brain cancer in her mother; Breast cancer in her sister; Dementia in her father; Hypertension in her father and mother; Lung cancer in her sister. Allergies  Allergen Reactions  . Hydromorphone Other (See Comments)    Cognitive changes  "made me unconscious"      Observations/Objective: Patient sounds cheerful and well on the phone. I do not appreciate any SOB. Speech and thought processing are grossly intact. Patient reported vitals:  Assessment and Plan:  Persistent bilateral ear fullness.  Suspect eustachian tube dysfunction.  She does  not any red flags such as sudden hearing loss  -We suggested trial of Flonase daily and at her request we went and set up ENT referral.  Follow-up immediately for any sudden hearing loss or other changes  Follow Up Instructions:  - as above   99441 5-10 [id number] 11-20 99443 21-30 I did not refer this patient for an OV in the next 24 hours for this/these issue(s).  I discussed the assessment and treatment plan with the patient. The patient was provided an opportunity to ask questions and all were answered. The patient agreed with the plan and demonstrated an understanding of the instructions.   The patient was advised to call back or seek an in-person evaluation if the symptoms worsen or if the condition fails to improve as anticipated.  I provided 17 minutes of non-face-to-face time during this encounter.   Carolann Littler, MD

## 2019-11-20 ENCOUNTER — Encounter (INDEPENDENT_AMBULATORY_CARE_PROVIDER_SITE_OTHER): Payer: Self-pay | Admitting: Otolaryngology

## 2019-11-20 ENCOUNTER — Other Ambulatory Visit: Payer: Self-pay

## 2019-11-20 ENCOUNTER — Ambulatory Visit (INDEPENDENT_AMBULATORY_CARE_PROVIDER_SITE_OTHER): Payer: Medicare Other | Admitting: Otolaryngology

## 2019-11-20 VITALS — Temp 97.7°F

## 2019-11-20 DIAGNOSIS — H903 Sensorineural hearing loss, bilateral: Secondary | ICD-10-CM | POA: Diagnosis not present

## 2019-11-20 DIAGNOSIS — H6121 Impacted cerumen, right ear: Secondary | ICD-10-CM

## 2019-11-20 DIAGNOSIS — H6983 Other specified disorders of Eustachian tube, bilateral: Secondary | ICD-10-CM

## 2019-11-20 NOTE — Progress Notes (Signed)
HPI: [name] is a 79 y.o. female who presents is referred by Dr. Elease Hashimoto for evaluation of ear symptoms.  Over the past 3 to 4 weeks she has been complaining of intermittent popping in her ears.  She has also been having some "sinus"congestion..  Denies any yellow-green discharge from her nose.  No headache or pain.  She has been using Flonase in the mornings.  The "popping" in her ears comes and goes.  Past Medical History:  Diagnosis Date  . Allergic rhinitis   . Anemia   . Anxiety   . Bronchiectasis   . Carpal tunnel syndrome 07/13/2015   Bilateral  . CVA (cerebral infarction) 10/2007   Right thalamic   . Diverticulosis of colon   . Gait disorder   . GERD (gastroesophageal reflux disease)   . History of breast cancer   . HTN (hypertension)   . Hyperlipidemia   . Idiopathic pulmonary fibrosis   . Left knee DJD   . Migraine   . Osteoporosis   . Peripheral neuropathy   . PMR (polymyalgia rheumatica) (HCC)   . Polyneuropathy in other diseases classified elsewhere ([place]) 02/17/2013  . Previous back surgery 10/09/12  . Renal insufficiency   . Rheumatoid arthritis ([place])   . Seizure disorder ([place])   . Spondylosis, cervical, with myelopathy 08/31/2015   C3-4 myelopathy  . Temporal arteritis (HCC)    Right eye blind, on steroids per [facility]/ Dr [name]  . Type II or unspecified type diabetes mellitus without mention of complication, not stated as uncontrolled    2nd to steriods  . Vitamin D deficiency    Past Surgical History:  Procedure Laterality Date  . CARPAL TUNNEL RELEASE Right   . CATARACT EXTRACTION     OS - Summer 2010  . CERVICAL FUSION  2010   4 rods and pins in place  . CHOLECYSTECTOMY    . COLONOSCOPY  12/15/2011   Procedure: COLONOSCOPY;  Surgeon: [name], MD;  [facility];  Service: Endoscopy;  Laterality: N/A;  . LUMBAR FUSION  04/2010   W/Mechanical fixation - [place]  2013   rods in hips (to stabilize).  Marland Kitchen  MASTECTOMY    . NECK SURGERY    . rheumatoid nodule removal    . SPINAL FUSION  07/2010   T10-L2 interbody fusion / [facility]  . TONSILLECTOMY    . TOTAL KNEE ARTHROPLASTY     Left   Social History   Socioeconomic History  . Marital status: Widowed    Spouse name: Not on file  . Number of children: 1  . Years of education: 12+ coll.  . Highest education level: Not on file  Occupational History  . Occupation: Education officer, environmental: RETIRED    Comment: retired  Tobacco Use  . Smoking status: Passive Smoke Exposure - Never Smoker  . Smokeless tobacco: Never Used  . Tobacco comment: Father   Substance and Sexual Activity  . Alcohol use: No    Alcohol/week: 0.0 standard drinks    Comment: heavy drinker until 1995 - sobriety with AA  . Drug use: No  . Sexual activity: Not Currently  Other Topics Concern  . Not on file  Social History Narrative      Married - husband invalid with stroke - widowed in 2009. 1 chil. 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 ACP - not formerly discussed - wishes  to be a full code.    Patient is right handed. Patient consumes tea 4  times daily.         Patient drinks about 4-5 cups of caffeine daily.   Patient is right handed.       [facility] (04/25/17):   Originally from [place], [place]. Previously lived in [place]. 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 No bird or mold exposure. No hot tub exposure. Has traveled to [place], [place], and [place] No pets currently. Enjoys going to the beach.       Social Determinants of Health   Financial Resource Strain:   . Difficulty of Paying Living Expenses: Not on file  Food Insecurity:   . Worried About Charity fundraiser in the Last Year: Not on file  . Ran Out of Food in the Last Year: Not on file  Transportation Needs:   . Lack of Transportation (Medical):  Not on file  . Lack of Transportation (Non-Medical): Not on file  Physical Activity:   . Days of Exercise per Week: Not on file  . Minutes of Exercise per Session: Not on file  Stress:   . Feeling of Stress : Not on file  Social Connections:   . Frequency of Communication with Friends and Family: Not on file  . Frequency of Social Gatherings with Friends and Family: Not on file  . Attends Religious Services: Not on file  . Active Member of Clubs or Organizations: Not on file  . Attends Archivist Meetings: Not on file  . Marital Status: Not on file   Family History  Problem Relation Age of Onset  . Brain cancer Mother   . Hypertension Mother   . Arthritis Mother   . Dementia Father   . Hypertension Father   . Arthritis Father   . Breast cancer Sister   . Lung cancer Sister   . Lung disease Neg Hx   . Rheumatologic disease Neg Hx    Allergies  Allergen Reactions  . Hydromorphone Other (See Comments)    Cognitive changes  "made me unconscious"   Prior to Admission medications   Medication Sig Start Date End Date Taking? Authorizing Provider  amLODipine (NORVASC) 5 MG tablet TAKE 1 TABLET DAILY 03/24/19  Yes Burchette, Alinda Sierras, MD  Calcium Carb-Cholecalciferol (CALCIUM CARBONATE-VITAMIN D3 PO) Take 1 tablet by mouth daily   Yes [provider]  cephALEXin (KEFLEX) 500 MG capsule  06/30/19  Yes [provider]  cholecalciferol (VITAMIN D) 1000 UNITS tablet Take 1,000 Units by mouth daily.     Yes [provider]  denosumab (PROLIA) 60 MG/ML SOLN Inject 60 mg into the skin every 6 (six) months.     Yes [provider]  diclofenac sodium (VOLTAREN) 1 % GEL APPLY 2 GRAMS TOPICALLY FOUR TIMES A DAY 05/12/19  Yes Kathrynn Ducking, MD  ferrous sulfate 325 (65 FE) MG tablet Take 325 mg by mouth 2 (two) times daily.    Yes [provider]  furosemide (LASIX) 40 MG tablet TAKE 1 TABLET DAILY 10/23/19  Yes Burchette, Alinda Sierras, MD   gabapentin (NEURONTIN) 300 MG capsule TAKE 2 CAPSULES TWICE A DAY 12/09/18  Yes Kathrynn Ducking, MD  Golimumab [facility] ARIA IV) Inject into the vein. Takes infusion every 8 wks.   Yes [provider]  Multiple Vitamins-Minerals (MULTIVITAMIN,TX-MINERALS) tablet Take 1 tablet by mouth daily.  Yes [provider]  MULTIPLE VITAMINS-MINERALS PO Take by mouth.   Yes [provider]  oxyCODONE (OXYCONTIN) 10 mg 12 hr tablet Take 1 tablet (10 mg total) by mouth every 12 (twelve) hours. 10/12/19  Yes Burchette, Alinda Sierras, MD  oxyCODONE (OXYCONTIN) 10 mg 12 hr tablet Take 1 tablet (10 mg total) by mouth every 12 (twelve) hours. Fill in one month 10/12/19  Yes Burchette, Alinda Sierras, MD  oxyCODONE (OXYCONTIN) 10 mg 12 hr tablet Take 1 tablet (10 mg total) by mouth every 12 (twelve) hours. Fill in two months 10/12/19  Yes Burchette, Alinda Sierras, MD  Oxycodone HCl 10 MG TABS Take 1 tablet (10 mg total) by mouth every 6 (six) hours as needed. 10/20/19  Yes Burchette, Alinda Sierras, MD  potassium chloride (KLOR-CON M10) 10 MEQ tablet Take 1 tablet (10 mEq total) by mouth 2 (two) times daily. 01/09/19  Yes Burchette, Alinda Sierras, MD  predniSONE (DELTASONE) 1 MG tablet TAKE 4 TABLETS DAILY WITH BREAKFAST 03/24/19  Yes Kathrynn Ducking, MD  simvastatin (ZOCOR) 5 MG tablet TAKE 1 TABLET AT BEDTIME 04/21/19  Yes Burchette, Alinda Sierras, MD  tizanidine (ZANAFLEX) 2 MG capsule Take 1 capsule (2 mg total) by mouth as needed for muscle spasms (take 1 at bedtime as needed for muscle spasms). 07/02/19  Yes Suzzanne Cloud, NP  potassium chloride (K-DUR) 10 MEQ tablet Take 1 tablet (10 mEq total) by mouth 2 (two) times daily. 12/29/12 03/02/13  Norins, Heinz Knuckles, MD     Positive ROS: Otherwise negative  All other systems have been reviewed and were otherwise negative with the exception of those mentioned in the HPI and as above.  Physical Exam: Constitutional: Alert, well-appearing, no acute distress Ears: External ears  without lesions or tenderness.  She has some wax buildup on the right side that was cleaned in the office.  The left ear canal was clear.  The TMs are clear bilaterally with good mobility pneumatic otoscopy with no middle ear effusion noted.  On tuning fork testing she has a mild decreased hearing with the 1024 tuning fork on both sides slightly worse on the left side. Nasal: External nose without lesions. Septum relatively midline with mild rhinitis.  Both middle meatus regions are clear with no signs of infection. Oral: Lips and gums without lesions. Tongue and palate mucosa without lesions. Posterior oropharynx clear with no substantial drainage noted. Neck: No palpable adenopathy or masses Respiratory: Breathing comfortably  Skin: No facial/neck lesions or rash noted.  Cerumen impaction removal  Date/Time: 11/20/2019 5:41 PM Performed by: Rozetta Nunnery, MD Authorized by: Rozetta Nunnery, MD   Consent:    Consent obtained:  Verbal   Consent given by:  Patient   Risks discussed:  Pain and bleeding Procedure details:    Location:  R ear   Procedure type: curette and forceps   Post-procedure details:    Inspection:  TM intact and canal normal   Hearing quality:  Improved   Patient tolerance of procedure:  Tolerated well, no immediate complications Comments:     TMs were clear bilaterally.    Assessment: Mild rhinitis. Mild sensorineural hearing loss noted on tuning fork testing. Eustachian tube dysfunction causing popping in the ears.  Plan: Recommended regular use of the Flonase and switch to nighttime dose versus a.m. dose. If she notices any difficulty with her hearing would recommend proceeding with audiologic testing.  She will call us back if she decides to get a hearing  test.   Radene Journey, MD   CC:

## 2019-11-23 DIAGNOSIS — M0589 Other rheumatoid arthritis with rheumatoid factor of multiple sites: Secondary | ICD-10-CM | POA: Diagnosis not present

## 2019-11-26 DIAGNOSIS — M79672 Pain in left foot: Secondary | ICD-10-CM | POA: Diagnosis not present

## 2019-11-26 DIAGNOSIS — N184 Chronic kidney disease, stage 4 (severe): Secondary | ICD-10-CM | POA: Diagnosis not present

## 2019-11-26 DIAGNOSIS — M5136 Other intervertebral disc degeneration, lumbar region: Secondary | ICD-10-CM | POA: Diagnosis not present

## 2019-11-26 DIAGNOSIS — M316 Other giant cell arteritis: Secondary | ICD-10-CM | POA: Diagnosis not present

## 2019-11-26 DIAGNOSIS — R5383 Other fatigue: Secondary | ICD-10-CM | POA: Diagnosis not present

## 2019-11-26 DIAGNOSIS — E663 Overweight: Secondary | ICD-10-CM | POA: Diagnosis not present

## 2019-11-26 DIAGNOSIS — J8410 Pulmonary fibrosis, unspecified: Secondary | ICD-10-CM | POA: Diagnosis not present

## 2019-11-26 DIAGNOSIS — M0589 Other rheumatoid arthritis with rheumatoid factor of multiple sites: Secondary | ICD-10-CM | POA: Diagnosis not present

## 2019-11-26 DIAGNOSIS — M79645 Pain in left finger(s): Secondary | ICD-10-CM | POA: Diagnosis not present

## 2019-11-26 DIAGNOSIS — M150 Primary generalized (osteo)arthritis: Secondary | ICD-10-CM | POA: Diagnosis not present

## 2019-11-26 DIAGNOSIS — M810 Age-related osteoporosis without current pathological fracture: Secondary | ICD-10-CM | POA: Diagnosis not present

## 2019-11-26 DIAGNOSIS — Z6825 Body mass index (BMI) 25.0-25.9, adult: Secondary | ICD-10-CM | POA: Diagnosis not present

## 2019-11-29 ENCOUNTER — Ambulatory Visit: Payer: Medicare Other | Attending: Internal Medicine

## 2019-11-29 DIAGNOSIS — Z23 Encounter for immunization: Secondary | ICD-10-CM

## 2019-11-29 NOTE — Progress Notes (Signed)
   Covid-19 Vaccination Clinic  Name:  [name]    MRN: [id number] DOB: [birth year]  11/29/2019  [name] was observed post Covid-19 immunization for 15 minutes without incident. She was provided with Vaccine Information Sheet and instruction to access the V-Safe system.   [name] was instructed to call 911 with any severe reactions post vaccine: Marland Kitchen Difficulty breathing  . Swelling of face and throat  . A fast heartbeat  . A bad rash all over body  . Dizziness and weakness   Immunizations Administered    Name Date Dose VIS Date Route   Pfizer COVID-19 Vaccine 11/29/2019  8:33 AM 0.3 mL 09/04/2019 Intramuscular   Manufacturer: [place]   Lot: [id number]   [place]: [id number]

## 2019-12-01 ENCOUNTER — Telehealth: Payer: Self-pay | Admitting: Neurology

## 2019-12-01 MED ORDER — GABAPENTIN 300 MG PO CAPS: 600.0000 mg | ORAL_CAPSULE | Freq: Two times a day (BID) | ORAL | 3 refills | Status: DC

## 2019-12-01 NOTE — Telephone Encounter (Signed)
Pt has appt scheduled.  Has been a consistent keeping RV.  Renewed gabapentin.

## 2019-12-01 NOTE — Telephone Encounter (Signed)
1) Medication(s) Requested (by name): gabapentin (NEURONTIN) 300 MG capsule   2) Pharmacy of Choice: [facility], [place]  [address] [place] [address]  Phone: [phone]

## 2019-12-02 DIAGNOSIS — Z9889 Other specified postprocedural states: Secondary | ICD-10-CM | POA: Diagnosis not present

## 2019-12-02 DIAGNOSIS — M47816 Spondylosis without myelopathy or radiculopathy, lumbar region: Secondary | ICD-10-CM | POA: Diagnosis not present

## 2019-12-02 DIAGNOSIS — Z96698 Presence of other orthopedic joint implants: Secondary | ICD-10-CM | POA: Diagnosis not present

## 2019-12-02 DIAGNOSIS — Z981 Arthrodesis status: Secondary | ICD-10-CM | POA: Diagnosis not present

## 2019-12-02 DIAGNOSIS — M545 Low back pain: Secondary | ICD-10-CM | POA: Diagnosis not present

## 2019-12-07 DIAGNOSIS — H04123 Dry eye syndrome of bilateral lacrimal glands: Secondary | ICD-10-CM | POA: Diagnosis not present

## 2019-12-07 DIAGNOSIS — H52202 Unspecified astigmatism, left eye: Secondary | ICD-10-CM | POA: Diagnosis not present

## 2019-12-07 DIAGNOSIS — H16103 Unspecified superficial keratitis, bilateral: Secondary | ICD-10-CM | POA: Diagnosis not present

## 2019-12-07 DIAGNOSIS — H4720 Unspecified optic atrophy: Secondary | ICD-10-CM | POA: Diagnosis not present

## 2019-12-08 DIAGNOSIS — M545 Low back pain: Secondary | ICD-10-CM | POA: Diagnosis not present

## 2019-12-08 DIAGNOSIS — Z789 Other specified health status: Secondary | ICD-10-CM | POA: Diagnosis not present

## 2019-12-08 DIAGNOSIS — R262 Difficulty in walking, not elsewhere classified: Secondary | ICD-10-CM | POA: Diagnosis not present

## 2019-12-08 DIAGNOSIS — G8929 Other chronic pain: Secondary | ICD-10-CM | POA: Diagnosis not present

## 2019-12-08 DIAGNOSIS — R29898 Other symptoms and signs involving the musculoskeletal system: Secondary | ICD-10-CM | POA: Diagnosis not present

## 2019-12-08 DIAGNOSIS — Z7409 Other reduced mobility: Secondary | ICD-10-CM | POA: Diagnosis not present

## 2019-12-08 DIAGNOSIS — Z981 Arthrodesis status: Secondary | ICD-10-CM | POA: Diagnosis not present

## 2019-12-09 DIAGNOSIS — Z7409 Other reduced mobility: Secondary | ICD-10-CM | POA: Diagnosis not present

## 2019-12-09 DIAGNOSIS — R29898 Other symptoms and signs involving the musculoskeletal system: Secondary | ICD-10-CM | POA: Diagnosis not present

## 2019-12-09 DIAGNOSIS — Z789 Other specified health status: Secondary | ICD-10-CM | POA: Diagnosis not present

## 2019-12-09 DIAGNOSIS — G8929 Other chronic pain: Secondary | ICD-10-CM | POA: Diagnosis not present

## 2019-12-09 DIAGNOSIS — R262 Difficulty in walking, not elsewhere classified: Secondary | ICD-10-CM | POA: Diagnosis not present

## 2019-12-09 DIAGNOSIS — M545 Low back pain: Secondary | ICD-10-CM | POA: Diagnosis not present

## 2019-12-16 DIAGNOSIS — R29898 Other symptoms and signs involving the musculoskeletal system: Secondary | ICD-10-CM | POA: Diagnosis not present

## 2019-12-16 DIAGNOSIS — M545 Low back pain: Secondary | ICD-10-CM | POA: Diagnosis not present

## 2019-12-16 DIAGNOSIS — R262 Difficulty in walking, not elsewhere classified: Secondary | ICD-10-CM | POA: Diagnosis not present

## 2019-12-16 DIAGNOSIS — G8929 Other chronic pain: Secondary | ICD-10-CM | POA: Diagnosis not present

## 2019-12-16 DIAGNOSIS — Z981 Arthrodesis status: Secondary | ICD-10-CM | POA: Diagnosis not present

## 2019-12-16 DIAGNOSIS — Z789 Other specified health status: Secondary | ICD-10-CM | POA: Diagnosis not present

## 2019-12-16 DIAGNOSIS — Z7409 Other reduced mobility: Secondary | ICD-10-CM | POA: Diagnosis not present

## 2019-12-19 ENCOUNTER — Other Ambulatory Visit: Payer: Self-pay | Admitting: Family Medicine

## 2019-12-22 DIAGNOSIS — R29898 Other symptoms and signs involving the musculoskeletal system: Secondary | ICD-10-CM | POA: Diagnosis not present

## 2019-12-22 DIAGNOSIS — M545 Low back pain: Secondary | ICD-10-CM | POA: Diagnosis not present

## 2019-12-22 DIAGNOSIS — R262 Difficulty in walking, not elsewhere classified: Secondary | ICD-10-CM | POA: Diagnosis not present

## 2019-12-22 DIAGNOSIS — Z789 Other specified health status: Secondary | ICD-10-CM | POA: Diagnosis not present

## 2019-12-22 DIAGNOSIS — Z981 Arthrodesis status: Secondary | ICD-10-CM | POA: Diagnosis not present

## 2019-12-22 DIAGNOSIS — G8929 Other chronic pain: Secondary | ICD-10-CM | POA: Diagnosis not present

## 2019-12-22 DIAGNOSIS — Z7409 Other reduced mobility: Secondary | ICD-10-CM | POA: Diagnosis not present

## 2019-12-24 DIAGNOSIS — R262 Difficulty in walking, not elsewhere classified: Secondary | ICD-10-CM | POA: Diagnosis not present

## 2019-12-24 DIAGNOSIS — M545 Low back pain: Secondary | ICD-10-CM | POA: Diagnosis not present

## 2019-12-24 DIAGNOSIS — R29898 Other symptoms and signs involving the musculoskeletal system: Secondary | ICD-10-CM | POA: Diagnosis not present

## 2019-12-24 DIAGNOSIS — Z981 Arthrodesis status: Secondary | ICD-10-CM | POA: Diagnosis not present

## 2019-12-24 DIAGNOSIS — G8929 Other chronic pain: Secondary | ICD-10-CM | POA: Diagnosis not present

## 2019-12-24 DIAGNOSIS — Z789 Other specified health status: Secondary | ICD-10-CM | POA: Diagnosis not present

## 2019-12-24 DIAGNOSIS — Z7409 Other reduced mobility: Secondary | ICD-10-CM | POA: Diagnosis not present

## 2019-12-28 DIAGNOSIS — M545 Low back pain: Secondary | ICD-10-CM | POA: Diagnosis not present

## 2019-12-28 DIAGNOSIS — Z7409 Other reduced mobility: Secondary | ICD-10-CM | POA: Diagnosis not present

## 2019-12-28 DIAGNOSIS — R262 Difficulty in walking, not elsewhere classified: Secondary | ICD-10-CM | POA: Diagnosis not present

## 2019-12-28 DIAGNOSIS — Z981 Arthrodesis status: Secondary | ICD-10-CM | POA: Diagnosis not present

## 2019-12-28 DIAGNOSIS — R29898 Other symptoms and signs involving the musculoskeletal system: Secondary | ICD-10-CM | POA: Diagnosis not present

## 2019-12-28 DIAGNOSIS — G8929 Other chronic pain: Secondary | ICD-10-CM | POA: Diagnosis not present

## 2019-12-28 DIAGNOSIS — Z789 Other specified health status: Secondary | ICD-10-CM | POA: Diagnosis not present

## 2019-12-30 DIAGNOSIS — M545 Low back pain: Secondary | ICD-10-CM | POA: Diagnosis not present

## 2019-12-30 DIAGNOSIS — Z789 Other specified health status: Secondary | ICD-10-CM | POA: Diagnosis not present

## 2019-12-30 DIAGNOSIS — R29898 Other symptoms and signs involving the musculoskeletal system: Secondary | ICD-10-CM | POA: Diagnosis not present

## 2019-12-30 DIAGNOSIS — Z7409 Other reduced mobility: Secondary | ICD-10-CM | POA: Diagnosis not present

## 2019-12-30 DIAGNOSIS — Z981 Arthrodesis status: Secondary | ICD-10-CM | POA: Diagnosis not present

## 2019-12-30 DIAGNOSIS — G8929 Other chronic pain: Secondary | ICD-10-CM | POA: Diagnosis not present

## 2019-12-30 DIAGNOSIS — R262 Difficulty in walking, not elsewhere classified: Secondary | ICD-10-CM | POA: Diagnosis not present

## 2020-01-04 DIAGNOSIS — I129 Hypertensive chronic kidney disease with stage 1 through stage 4 chronic kidney disease, or unspecified chronic kidney disease: Secondary | ICD-10-CM | POA: Diagnosis not present

## 2020-01-04 DIAGNOSIS — N183 Chronic kidney disease, stage 3 unspecified: Secondary | ICD-10-CM | POA: Diagnosis not present

## 2020-01-05 ENCOUNTER — Other Ambulatory Visit: Payer: Self-pay

## 2020-01-05 ENCOUNTER — Ambulatory Visit (INDEPENDENT_AMBULATORY_CARE_PROVIDER_SITE_OTHER): Payer: Medicare Other | Admitting: Neurology

## 2020-01-05 ENCOUNTER — Encounter: Payer: Self-pay | Admitting: Neurology

## 2020-01-05 VITALS — BP 128/72 | HR 71 | Temp 98.1°F | Ht 61.0 in | Wt 147.0 lb

## 2020-01-05 DIAGNOSIS — G63 Polyneuropathy in diseases classified elsewhere: Secondary | ICD-10-CM

## 2020-01-05 DIAGNOSIS — M316 Other giant cell arteritis: Secondary | ICD-10-CM

## 2020-01-05 MED ORDER — TIZANIDINE HCL 2 MG PO CAPS: 2.0000 mg | ORAL_CAPSULE | ORAL | 3 refills | Status: DC | PRN

## 2020-01-05 NOTE — Progress Notes (Signed)
Reason for visit: Temporal arteritis, peripheral neuropathy  [name] is an 79 y.o. female  History of present illness:  [name] is a 79 year old right-handed white female with a history of temporal arteritis, she also has rheumatoid arthritis, she has not been able to get lower than 4 mg daily of the prednisone.  The patient had her fourth lumbosacral spine surgery on 14 September 2019, she has actually done quite well since this without any pain in the back, she still uses a walker for ambulation.  She has a left foot drop related to a prior L5 radiculopathy.  She has not had any falls.  She is not having any neck pain, she has had 2 prior neck surgeries.  She does have chronic insomnia, she reports some edema of the left ankle and foot, she has significant arthritis affecting the foot.  The patient is followed through rheumatology with Dr. Amil Amen, she gets IV medications for her rheumatoid arthritis.  The patient now is in physical therapy following the back surgery.  She will have occasional jerking of the legs at night, she will take tizanidine for this.  Past Medical History:  Diagnosis Date  . Allergic rhinitis   . Anemia   . Anxiety   . Bronchiectasis   . Carpal tunnel syndrome 07/13/2015   Bilateral  . CVA (cerebral infarction) 10/2007   Right thalamic   . Diverticulosis of colon   . Gait disorder   . GERD (gastroesophageal reflux disease)   . History of breast cancer   . HTN (hypertension)   . Hyperlipidemia   . Idiopathic pulmonary fibrosis   . Left knee DJD   . Migraine   . Osteoporosis   . Peripheral neuropathy   . PMR (polymyalgia rheumatica) (HCC)   . Polyneuropathy in other diseases classified elsewhere ([place]) 02/17/2013  . Previous back surgery 10/09/12  . Renal insufficiency   . Rheumatoid arthritis ([place])   . Seizure disorder ([place])   . Spondylosis, cervical, with myelopathy 08/31/2015   C3-4 myelopathy  . Temporal arteritis (HCC)    Right eye  blind, on steroids per [facility]/ Dr [name]  . Type II or unspecified type diabetes mellitus without mention of complication, not stated as uncontrolled    2nd to steriods  . Vitamin D deficiency     Past Surgical History:  Procedure Laterality Date  . CARPAL TUNNEL RELEASE Right   . CATARACT EXTRACTION     OS - Summer 2010  . CERVICAL FUSION  2010   4 rods and pins in place  . CHOLECYSTECTOMY    . COLONOSCOPY  12/15/2011   Procedure: COLONOSCOPY;  Surgeon: [name], MD;  [facility];  Service: Endoscopy;  Laterality: N/A;  . LUMBAR FUSION  04/2010   W/Mechanical fixation - [place]  2013   rods in hips (to stabilize).  Marland Kitchen MASTECTOMY    . NECK SURGERY    . rheumatoid nodule removal    . SPINAL FUSION  07/2010   T10-L2 interbody fusion / [facility]  . TONSILLECTOMY    . TOTAL KNEE ARTHROPLASTY     Left    Family History  Problem Relation Age of Onset  . Brain cancer Mother   . Hypertension Mother   . Arthritis Mother   . Dementia Father   . Hypertension Father   . Arthritis Father   . Breast cancer Sister   . Lung cancer Sister   .  Lung disease Neg Hx   . Rheumatologic disease Neg Hx     Social history:  reports that she has never smoked. She has never used smokeless tobacco. She reports that she does not drink alcohol or use drugs.    Allergies  Allergen Reactions  . Hydromorphone Other (See Comments)    Cognitive changes  "made me unconscious"    Medications:  Prior to Admission medications   Medication Sig Start Date End Date Taking? Authorizing Provider  amLODipine (NORVASC) 5 MG tablet TAKE 1 TABLET DAILY 03/24/19  Yes Burchette, Alinda Sierras, MD  Calcium Carb-Cholecalciferol (CALCIUM CARBONATE-VITAMIN D3 PO) Take 1 tablet by mouth daily   Yes [provider]  cephALEXin (KEFLEX) 500 MG capsule Dental work 06/30/19  Yes [provider]  cholecalciferol (VITAMIN D) 1000 UNITS tablet Take 1,000 Units  by mouth daily.     Yes [provider]  denosumab (PROLIA) 60 MG/ML SOLN Inject 60 mg into the skin every 6 (six) months.     Yes [provider]  diclofenac sodium (VOLTAREN) 1 % GEL APPLY 2 GRAMS TOPICALLY FOUR TIMES A DAY 05/12/19  Yes Kathrynn Ducking, MD  ferrous sulfate 325 (65 FE) MG tablet Take 325 mg by mouth 2 (two) times daily.    Yes [provider]  furosemide (LASIX) 40 MG tablet TAKE 1 TABLET DAILY 10/23/19  Yes Burchette, Alinda Sierras, MD  gabapentin (NEURONTIN) 300 MG capsule Take 2 capsules (600 mg total) by mouth 2 (two) times daily. 12/01/19  Yes Kathrynn Ducking, MD  Golimumab [facility] ARIA IV) Inject into the vein. Takes infusion every 8 wks.   Yes [provider]  Multiple Vitamins-Minerals (MULTIVITAMIN,TX-MINERALS) tablet Take 1 tablet by mouth daily.     Yes [provider]  MULTIPLE VITAMINS-MINERALS PO Take by mouth.   Yes [provider]  oxyCODONE (OXYCONTIN) 10 mg 12 hr tablet Take 1 tablet (10 mg total) by mouth every 12 (twelve) hours. 10/12/19  Yes Burchette, Alinda Sierras, MD  Oxycodone HCl 10 MG TABS Take 1 tablet (10 mg total) by mouth every 6 (six) hours as needed. 10/20/19  Yes Burchette, Alinda Sierras, MD  potassium chloride (KLOR-CON) 10 MEQ tablet TAKE 1 TABLET TWICE A DAY 12/19/19  Yes Burchette, Alinda Sierras, MD  predniSONE (DELTASONE) 1 MG tablet TAKE 4 TABLETS DAILY WITH BREAKFAST 03/24/19  Yes Kathrynn Ducking, MD  simvastatin (ZOCOR) 5 MG tablet TAKE 1 TABLET AT BEDTIME 04/21/19  Yes Burchette, Alinda Sierras, MD  tizanidine (ZANAFLEX) 2 MG capsule Take 1 capsule (2 mg total) by mouth as needed for muscle spasms (take 1 at bedtime as needed for muscle spasms). 07/02/19  Yes Suzzanne Cloud, NP  potassium chloride (K-DUR) 10 MEQ tablet Take 1 tablet (10 mEq total) by mouth 2 (two) times daily. 12/29/12 03/02/13  Norins, Heinz Knuckles, MD    ROS:  Out of a complete 14 system review of symptoms, the patient complains only of the following  symptoms, and all other reviewed systems are negative.  Chronic insomnia Walking difficulty Left foot swelling  Blood pressure 128/72, pulse 71, temperature 98.1 F (36.7 C), height 5\' 1"  (1.549 m), weight 147 lb (66.7 kg).  Physical Exam  General: The patient is alert and cooperative at the time of the examination.  Skin: 1+ edema of the right ankle is noted, 2+ on the left.   Neurologic Exam  Mental status: The patient is alert and oriented x 3 at the time of  the examination. The patient has apparent normal recent and remote memory, with an apparently normal attention span and concentration ability.   Cranial nerves: Facial symmetry is present. Speech is normal, no aphasia or dysarthria is noted. Extraocular movements are full. Visual fields are full.  Motor: The patient has good strength in all 4 extremities, with exception of a left foot drop.  Sensory examination: Soft touch sensation is symmetric on the face, arms, and legs.  Coordination: The patient has good finger-nose-finger and heel-to-shin bilaterally.  Gait and station: The has significant kyphosis with standing, she is able to walk independently with a slightly wide-based gait, but usually uses a walker.  Romberg is negative.  Reflexes: Deep tendon reflexes are symmetric.   Assessment/Plan:  1.  Temporal arteritis  2.  Peripheral neuropathy  3.  Left foot drop  4.  Cervical myelopathy  5.  Lumbosacral spondylosis, recent back surgery  The patient is overall doing fairly well, she will continue on her current medications with gabapentin, prednisone, and tizanidine.  A prescription for tizanidine was sent in.  She will follow-up here in 1 year or sooner if needed.  Jill Alexanders MD 01/05/2020 11:16 AM  [facility] [address] [place], [place] [address]  Phone [phone] Fax [phone]

## 2020-01-06 DIAGNOSIS — Z7409 Other reduced mobility: Secondary | ICD-10-CM | POA: Diagnosis not present

## 2020-01-06 DIAGNOSIS — R262 Difficulty in walking, not elsewhere classified: Secondary | ICD-10-CM | POA: Diagnosis not present

## 2020-01-06 DIAGNOSIS — M545 Low back pain: Secondary | ICD-10-CM | POA: Diagnosis not present

## 2020-01-06 DIAGNOSIS — Z789 Other specified health status: Secondary | ICD-10-CM | POA: Diagnosis not present

## 2020-01-06 DIAGNOSIS — M0589 Other rheumatoid arthritis with rheumatoid factor of multiple sites: Secondary | ICD-10-CM | POA: Diagnosis not present

## 2020-01-06 DIAGNOSIS — Z981 Arthrodesis status: Secondary | ICD-10-CM | POA: Diagnosis not present

## 2020-01-06 DIAGNOSIS — M810 Age-related osteoporosis without current pathological fracture: Secondary | ICD-10-CM | POA: Diagnosis not present

## 2020-01-06 DIAGNOSIS — R29898 Other symptoms and signs involving the musculoskeletal system: Secondary | ICD-10-CM | POA: Diagnosis not present

## 2020-01-06 DIAGNOSIS — G8929 Other chronic pain: Secondary | ICD-10-CM | POA: Diagnosis not present

## 2020-01-07 DIAGNOSIS — Z7409 Other reduced mobility: Secondary | ICD-10-CM | POA: Diagnosis not present

## 2020-01-07 DIAGNOSIS — R29898 Other symptoms and signs involving the musculoskeletal system: Secondary | ICD-10-CM | POA: Diagnosis not present

## 2020-01-07 DIAGNOSIS — M545 Low back pain: Secondary | ICD-10-CM | POA: Diagnosis not present

## 2020-01-07 DIAGNOSIS — Z789 Other specified health status: Secondary | ICD-10-CM | POA: Diagnosis not present

## 2020-01-07 DIAGNOSIS — R262 Difficulty in walking, not elsewhere classified: Secondary | ICD-10-CM | POA: Diagnosis not present

## 2020-01-07 DIAGNOSIS — Z981 Arthrodesis status: Secondary | ICD-10-CM | POA: Diagnosis not present

## 2020-01-07 DIAGNOSIS — G8929 Other chronic pain: Secondary | ICD-10-CM | POA: Diagnosis not present

## 2020-01-08 ENCOUNTER — Ambulatory Visit (INDEPENDENT_AMBULATORY_CARE_PROVIDER_SITE_OTHER): Payer: Medicare Other | Admitting: Family Medicine

## 2020-01-08 ENCOUNTER — Encounter: Payer: Self-pay | Admitting: Family Medicine

## 2020-01-08 ENCOUNTER — Other Ambulatory Visit: Payer: Self-pay

## 2020-01-08 ENCOUNTER — Telehealth: Payer: Self-pay | Admitting: Family Medicine

## 2020-01-08 VITALS — BP 124/66 | HR 86 | Temp 98.1°F | Wt 142.6 lb

## 2020-01-08 DIAGNOSIS — G8929 Other chronic pain: Secondary | ICD-10-CM | POA: Diagnosis not present

## 2020-01-08 DIAGNOSIS — E876 Hypokalemia: Secondary | ICD-10-CM | POA: Diagnosis not present

## 2020-01-08 DIAGNOSIS — R5383 Other fatigue: Secondary | ICD-10-CM

## 2020-01-08 DIAGNOSIS — M545 Low back pain, unspecified: Secondary | ICD-10-CM

## 2020-01-08 LAB — CBC WITH DIFFERENTIAL/PLATELET
Basophils Absolute: 0 10*3/uL (ref 0.0–0.1)
Basophils Relative: 0.6 % (ref 0.0–3.0)
Eosinophils Absolute: 0.4 10*3/uL (ref 0.0–0.7)
Eosinophils Relative: 5.4 % — ABNORMAL HIGH (ref 0.0–5.0)
HCT: 39.8 % (ref 36.0–46.0)
Hemoglobin: 13.5 g/dL (ref 12.0–15.0)
Lymphocytes Relative: 16.4 % (ref 12.0–46.0)
Lymphs Abs: 1.1 10*3/uL (ref 0.7–4.0)
MCHC: 34 g/dL (ref 30.0–36.0)
MCV: 93.8 fl (ref 78.0–100.0)
Monocytes Absolute: 0.3 10*3/uL (ref 0.1–1.0)
Monocytes Relative: 4.6 % (ref 3.0–12.0)
Neutro Abs: 4.8 10*3/uL (ref 1.4–7.7)
Neutrophils Relative %: 73 % (ref 43.0–77.0)
Platelets: 205 10*3/uL (ref 150.0–400.0)
RBC: 4.25 Mil/uL (ref 3.87–5.11)
RDW: 14.4 % (ref 11.5–15.5)
WBC: 6.6 10*3/uL (ref 4.0–10.5)

## 2020-01-08 LAB — TSH: TSH: 2.13 u[IU]/mL (ref 0.35–4.50)

## 2020-01-08 LAB — BASIC METABOLIC PANEL
BUN: 21 mg/dL (ref 6–23)
CO2: 31 mEq/L (ref 19–32)
Calcium: 9.1 mg/dL (ref 8.4–10.5)
Chloride: 102 mEq/L (ref 96–112)
Creatinine, Ser: 1.3 mg/dL — ABNORMAL HIGH (ref 0.40–1.20)
GFR: 39.44 mL/min — ABNORMAL LOW (ref >60.00)
Glucose, Bld: 142 mg/dL — ABNORMAL HIGH (ref 70–99)
Potassium: 4.2 mEq/L (ref 3.5–5.1)
Sodium: 142 mEq/L (ref 135–145)

## 2020-01-08 MED ORDER — OXYCODONE HCL ER 10 MG PO T12A: 10.0000 mg | EXTENDED_RELEASE_TABLET | Freq: Two times a day (BID) | ORAL | 0 refills | Status: DC

## 2020-01-08 MED ORDER — OXYCODONE HCL ER 10 MG PO T12A: EXTENDED_RELEASE_TABLET | ORAL | 0 refills | Status: DC

## 2020-01-08 NOTE — Telephone Encounter (Signed)
Pt is requesting a 3 month refill for Oxycontin 10 mg. Pt uses [facility]. Thanks

## 2020-01-08 NOTE — Telephone Encounter (Signed)
Please advise 

## 2020-01-08 NOTE — Progress Notes (Signed)
Subjective:     Patient ID: [name], female   DOB: [birth year], 79 y.o.   MRN: [id number]  HPI   [name] is here for chronic pain management follow-up.  She has chronic back pain and has been on regimen of OxyContin 12 mg every 12 hours for several years.  She occasionally supplements with 10 mg oxycodone.  She had to have significant back surgery several months ago after rod broke in her back.  She has done very well since then.  She has recently had some physical therapy.  Her pain is fairly well controlled on current dose of oxycodone.  She has occasional constipation but not severe  She has some general fatigue issues.  She is not sure regarding etiology.  Appetite and weight are stable.  She does relate that she had recent renal function through her nephrologist and had low potassium level.  She is not sure how low but this was treated with oral replacement.  She has not had follow-up CBCs since her back surgery.  No recent TSH on file.  No chest pain.  No dizziness.  Past Medical History:  Diagnosis Date  . Allergic rhinitis   . Anemia   . Anxiety   . Bronchiectasis   . Carpal tunnel syndrome 07/13/2015   Bilateral  . CVA (cerebral infarction) 10/2007   Right thalamic   . Diverticulosis of colon   . Gait disorder   . GERD (gastroesophageal reflux disease)   . History of breast cancer   . HTN (hypertension)   . Hyperlipidemia   . Idiopathic pulmonary fibrosis   . Left knee DJD   . Migraine   . Osteoporosis   . Peripheral neuropathy   . PMR (polymyalgia rheumatica) (HCC)   . Polyneuropathy in other diseases classified elsewhere ([place]) 02/17/2013  . Previous back surgery 10/09/12  . Renal insufficiency   . Rheumatoid arthritis ([place])   . Seizure disorder ([place])   . Spondylosis, cervical, with myelopathy 08/31/2015   C3-4 myelopathy  . Temporal arteritis (HCC)    Right eye blind, on steroids per [facility]/ Dr [name]  . Type II or unspecified type diabetes mellitus without  mention of complication, not stated as uncontrolled    2nd to steriods  . Vitamin D deficiency    Past Surgical History:  Procedure Laterality Date  . CARPAL TUNNEL RELEASE Right   . CATARACT EXTRACTION     OS - Summer 2010  . CERVICAL FUSION  2010   4 rods and pins in place  . CHOLECYSTECTOMY    . COLONOSCOPY  12/15/2011   Procedure: COLONOSCOPY;  Surgeon: [name], MD;  Location: [facility];  Service: Endoscopy;  Laterality: N/A;  . LUMBAR FUSION  04/2010   W/Mechanical fixation - [place]  2013   rods in hips (to stabilize).  Marland Kitchen MASTECTOMY    . NECK SURGERY    . rheumatoid nodule removal    . SPINAL FUSION  07/2010   T10-L2 interbody fusion / [facility]  . TONSILLECTOMY    . TOTAL KNEE ARTHROPLASTY     Left    reports that she has never smoked. She has never used smokeless tobacco. She reports that she does not drink alcohol or use drugs. family history includes Arthritis in her father and mother; Brain cancer in her mother; Breast cancer in her sister; Dementia in her father; Hypertension in her father and mother; Lung cancer in her sister. Allergies  Allergen  Reactions  . Hydromorphone Other (See Comments)    Cognitive changes  "made me unconscious"     Review of Systems  Constitutional: Positive for fatigue. Negative for appetite change, chills, fever and unexpected weight change.  Respiratory: Negative for cough and shortness of breath.   Cardiovascular: Negative for chest pain.  Gastrointestinal: Negative for abdominal pain.  Genitourinary: Negative for dysuria.       Objective:   Physical Exam Constitutional:      Appearance: She is well-developed.  Eyes:     Pupils: Pupils are equal, round, and reactive to light.  Neck:     Thyroid: No thyromegaly.     Vascular: No JVD.  Cardiovascular:     Rate and Rhythm: Normal rate and regular rhythm.     Heart sounds: No gallop.   Pulmonary:     Effort: Pulmonary effort is  normal. No respiratory distress.     Breath sounds: Normal breath sounds. No wheezing or rales.  Musculoskeletal:     Cervical back: Neck supple.  Neurological:     Mental Status: She is alert.        Assessment:     #1 chronic neck and back pain stable on current dose of chronic opioid  #2 recent reported hypokalemia per nephrologist  #3 generalized fatigue of unclear etiology    Plan:     -Check basic metabolic panel, CBC, TSH- to evaluate her fatigue and recent hypokalemia.   -Continue physical therapy for general strengthening  -Refill her OxyContin for 3 months and will schedule III month follow-up  Eulas Post MD [facility]

## 2020-01-12 DIAGNOSIS — R29898 Other symptoms and signs involving the musculoskeletal system: Secondary | ICD-10-CM | POA: Diagnosis not present

## 2020-01-12 DIAGNOSIS — R262 Difficulty in walking, not elsewhere classified: Secondary | ICD-10-CM | POA: Diagnosis not present

## 2020-01-12 DIAGNOSIS — Z981 Arthrodesis status: Secondary | ICD-10-CM | POA: Diagnosis not present

## 2020-01-12 DIAGNOSIS — M545 Low back pain: Secondary | ICD-10-CM | POA: Diagnosis not present

## 2020-01-12 DIAGNOSIS — Z7409 Other reduced mobility: Secondary | ICD-10-CM | POA: Diagnosis not present

## 2020-01-12 DIAGNOSIS — G8929 Other chronic pain: Secondary | ICD-10-CM | POA: Diagnosis not present

## 2020-01-12 DIAGNOSIS — Z789 Other specified health status: Secondary | ICD-10-CM | POA: Diagnosis not present

## 2020-01-14 DIAGNOSIS — Z981 Arthrodesis status: Secondary | ICD-10-CM | POA: Diagnosis not present

## 2020-01-14 DIAGNOSIS — Z789 Other specified health status: Secondary | ICD-10-CM | POA: Diagnosis not present

## 2020-01-14 DIAGNOSIS — M545 Low back pain: Secondary | ICD-10-CM | POA: Diagnosis not present

## 2020-01-14 DIAGNOSIS — Z7409 Other reduced mobility: Secondary | ICD-10-CM | POA: Diagnosis not present

## 2020-01-14 DIAGNOSIS — G8929 Other chronic pain: Secondary | ICD-10-CM | POA: Diagnosis not present

## 2020-01-14 DIAGNOSIS — R262 Difficulty in walking, not elsewhere classified: Secondary | ICD-10-CM | POA: Diagnosis not present

## 2020-01-14 DIAGNOSIS — R29898 Other symptoms and signs involving the musculoskeletal system: Secondary | ICD-10-CM | POA: Diagnosis not present

## 2020-01-20 DIAGNOSIS — N183 Chronic kidney disease, stage 3 unspecified: Secondary | ICD-10-CM | POA: Diagnosis not present

## 2020-01-20 DIAGNOSIS — M0589 Other rheumatoid arthritis with rheumatoid factor of multiple sites: Secondary | ICD-10-CM | POA: Diagnosis not present

## 2020-01-21 ENCOUNTER — Telehealth: Payer: Self-pay | Admitting: Family Medicine

## 2020-01-21 DIAGNOSIS — R29898 Other symptoms and signs involving the musculoskeletal system: Secondary | ICD-10-CM | POA: Diagnosis not present

## 2020-01-21 DIAGNOSIS — Z981 Arthrodesis status: Secondary | ICD-10-CM | POA: Diagnosis not present

## 2020-01-21 DIAGNOSIS — M545 Low back pain: Secondary | ICD-10-CM | POA: Diagnosis not present

## 2020-01-21 DIAGNOSIS — Z789 Other specified health status: Secondary | ICD-10-CM | POA: Diagnosis not present

## 2020-01-21 DIAGNOSIS — R262 Difficulty in walking, not elsewhere classified: Secondary | ICD-10-CM | POA: Diagnosis not present

## 2020-01-21 DIAGNOSIS — G8929 Other chronic pain: Secondary | ICD-10-CM | POA: Diagnosis not present

## 2020-01-21 DIAGNOSIS — Z7409 Other reduced mobility: Secondary | ICD-10-CM | POA: Diagnosis not present

## 2020-01-21 NOTE — Telephone Encounter (Signed)
Patient dropped off disability placard form to have completed.  Call the patient to pick up form at: [phone]  Disposition: Dr's folder

## 2020-01-22 NOTE — Telephone Encounter (Signed)
Placed in red folder  

## 2020-01-26 DIAGNOSIS — G8929 Other chronic pain: Secondary | ICD-10-CM | POA: Diagnosis not present

## 2020-01-26 DIAGNOSIS — R29898 Other symptoms and signs involving the musculoskeletal system: Secondary | ICD-10-CM | POA: Diagnosis not present

## 2020-01-26 DIAGNOSIS — Z7409 Other reduced mobility: Secondary | ICD-10-CM | POA: Diagnosis not present

## 2020-01-26 DIAGNOSIS — M545 Low back pain: Secondary | ICD-10-CM | POA: Diagnosis not present

## 2020-01-26 DIAGNOSIS — R262 Difficulty in walking, not elsewhere classified: Secondary | ICD-10-CM | POA: Diagnosis not present

## 2020-01-26 DIAGNOSIS — Z789 Other specified health status: Secondary | ICD-10-CM | POA: Diagnosis not present

## 2020-01-26 DIAGNOSIS — Z981 Arthrodesis status: Secondary | ICD-10-CM | POA: Diagnosis not present

## 2020-01-28 DIAGNOSIS — Z981 Arthrodesis status: Secondary | ICD-10-CM | POA: Diagnosis not present

## 2020-01-28 DIAGNOSIS — M545 Low back pain: Secondary | ICD-10-CM | POA: Diagnosis not present

## 2020-01-28 DIAGNOSIS — Z789 Other specified health status: Secondary | ICD-10-CM | POA: Diagnosis not present

## 2020-01-28 DIAGNOSIS — Z7409 Other reduced mobility: Secondary | ICD-10-CM | POA: Diagnosis not present

## 2020-01-28 DIAGNOSIS — G8929 Other chronic pain: Secondary | ICD-10-CM | POA: Diagnosis not present

## 2020-01-28 DIAGNOSIS — R29898 Other symptoms and signs involving the musculoskeletal system: Secondary | ICD-10-CM | POA: Diagnosis not present

## 2020-01-28 DIAGNOSIS — R262 Difficulty in walking, not elsewhere classified: Secondary | ICD-10-CM | POA: Diagnosis not present

## 2020-02-02 DIAGNOSIS — M545 Low back pain: Secondary | ICD-10-CM | POA: Diagnosis not present

## 2020-02-02 DIAGNOSIS — Z981 Arthrodesis status: Secondary | ICD-10-CM | POA: Diagnosis not present

## 2020-02-02 DIAGNOSIS — G8929 Other chronic pain: Secondary | ICD-10-CM | POA: Diagnosis not present

## 2020-02-02 DIAGNOSIS — R29898 Other symptoms and signs involving the musculoskeletal system: Secondary | ICD-10-CM | POA: Diagnosis not present

## 2020-02-02 DIAGNOSIS — R262 Difficulty in walking, not elsewhere classified: Secondary | ICD-10-CM | POA: Diagnosis not present

## 2020-02-02 DIAGNOSIS — Z789 Other specified health status: Secondary | ICD-10-CM | POA: Diagnosis not present

## 2020-02-02 DIAGNOSIS — Z7409 Other reduced mobility: Secondary | ICD-10-CM | POA: Diagnosis not present

## 2020-02-11 DIAGNOSIS — Z789 Other specified health status: Secondary | ICD-10-CM | POA: Diagnosis not present

## 2020-02-11 DIAGNOSIS — R29898 Other symptoms and signs involving the musculoskeletal system: Secondary | ICD-10-CM | POA: Diagnosis not present

## 2020-02-11 DIAGNOSIS — Z981 Arthrodesis status: Secondary | ICD-10-CM | POA: Diagnosis not present

## 2020-02-11 DIAGNOSIS — R262 Difficulty in walking, not elsewhere classified: Secondary | ICD-10-CM | POA: Diagnosis not present

## 2020-02-11 DIAGNOSIS — Z7409 Other reduced mobility: Secondary | ICD-10-CM | POA: Diagnosis not present

## 2020-02-11 DIAGNOSIS — G8929 Other chronic pain: Secondary | ICD-10-CM | POA: Diagnosis not present

## 2020-02-11 DIAGNOSIS — M545 Low back pain: Secondary | ICD-10-CM | POA: Diagnosis not present

## 2020-02-15 ENCOUNTER — Other Ambulatory Visit: Payer: Self-pay | Admitting: Family Medicine

## 2020-02-15 NOTE — Telephone Encounter (Signed)
Pt is calling in stating that she will be out of town ([place]) for when her medication will be due to be filled on 02/18/2020 Oxycodone (OXYCONTIN) 10mg  12 hr  Pharm:  [facility] [address]  [phone]. This is only for this month for the refill.

## 2020-02-15 NOTE — Telephone Encounter (Signed)
She already has the rx at pharmacy.  Can you call to see if they can release her prescription early since she will [place] at usual time of refill?

## 2020-02-15 NOTE — Telephone Encounter (Signed)
Please advise pended with pharmacy

## 2020-02-15 NOTE — Telephone Encounter (Signed)
Lvm for pt to call back pharmacy will fill early

## 2020-02-16 DIAGNOSIS — R262 Difficulty in walking, not elsewhere classified: Secondary | ICD-10-CM | POA: Diagnosis not present

## 2020-02-16 DIAGNOSIS — M545 Low back pain: Secondary | ICD-10-CM | POA: Diagnosis not present

## 2020-02-16 DIAGNOSIS — Z981 Arthrodesis status: Secondary | ICD-10-CM | POA: Diagnosis not present

## 2020-02-16 DIAGNOSIS — Z789 Other specified health status: Secondary | ICD-10-CM | POA: Diagnosis not present

## 2020-02-16 DIAGNOSIS — G8929 Other chronic pain: Secondary | ICD-10-CM | POA: Diagnosis not present

## 2020-02-16 DIAGNOSIS — R29898 Other symptoms and signs involving the musculoskeletal system: Secondary | ICD-10-CM | POA: Diagnosis not present

## 2020-02-16 DIAGNOSIS — Z7409 Other reduced mobility: Secondary | ICD-10-CM | POA: Diagnosis not present

## 2020-02-25 DIAGNOSIS — Z7409 Other reduced mobility: Secondary | ICD-10-CM | POA: Diagnosis not present

## 2020-02-25 DIAGNOSIS — Z789 Other specified health status: Secondary | ICD-10-CM | POA: Diagnosis not present

## 2020-02-25 DIAGNOSIS — R262 Difficulty in walking, not elsewhere classified: Secondary | ICD-10-CM | POA: Diagnosis not present

## 2020-02-25 DIAGNOSIS — G8929 Other chronic pain: Secondary | ICD-10-CM | POA: Diagnosis not present

## 2020-02-25 DIAGNOSIS — R29898 Other symptoms and signs involving the musculoskeletal system: Secondary | ICD-10-CM | POA: Diagnosis not present

## 2020-02-25 DIAGNOSIS — M545 Low back pain: Secondary | ICD-10-CM | POA: Diagnosis not present

## 2020-02-25 DIAGNOSIS — Z981 Arthrodesis status: Secondary | ICD-10-CM | POA: Diagnosis not present

## 2020-02-26 ENCOUNTER — Other Ambulatory Visit: Payer: Self-pay | Admitting: Family Medicine

## 2020-02-27 ENCOUNTER — Emergency Department (HOSPITAL_COMMUNITY)
Admission: EM | Admit: 2020-02-27 | Discharge: 2020-02-27 | Disposition: A | Discharge status: Home / Self Care | Payer: Medicare Other | Attending: Emergency Medicine | Admitting: Emergency Medicine

## 2020-02-27 ENCOUNTER — Other Ambulatory Visit: Payer: Self-pay

## 2020-02-27 DIAGNOSIS — Y9289 Other specified places as the place of occurrence of the external cause: Secondary | ICD-10-CM | POA: Diagnosis not present

## 2020-02-27 DIAGNOSIS — S0502XA Injury of conjunctiva and corneal abrasion without foreign body, left eye, initial encounter: Secondary | ICD-10-CM | POA: Diagnosis not present

## 2020-02-27 DIAGNOSIS — Y998 Other external cause status: Secondary | ICD-10-CM | POA: Diagnosis not present

## 2020-02-27 DIAGNOSIS — I10 Essential (primary) hypertension: Secondary | ICD-10-CM | POA: Insufficient documentation

## 2020-02-27 DIAGNOSIS — E119 Type 2 diabetes mellitus without complications: Secondary | ICD-10-CM | POA: Insufficient documentation

## 2020-02-27 DIAGNOSIS — Z23 Encounter for immunization: Secondary | ICD-10-CM | POA: Insufficient documentation

## 2020-02-27 DIAGNOSIS — X58XXXA Exposure to other specified factors, initial encounter: Secondary | ICD-10-CM | POA: Insufficient documentation

## 2020-02-27 DIAGNOSIS — Z7984 Long term (current) use of oral hypoglycemic drugs: Secondary | ICD-10-CM | POA: Insufficient documentation

## 2020-02-27 DIAGNOSIS — Y9389 Activity, other specified: Secondary | ICD-10-CM | POA: Diagnosis not present

## 2020-02-27 DIAGNOSIS — S0592XA Unspecified injury of left eye and orbit, initial encounter: Secondary | ICD-10-CM | POA: Diagnosis present

## 2020-02-27 MED ORDER — TETRACAINE HCL 0.5 % OP SOLN
2.0000 [drp] | Freq: Once | OPHTHALMIC | Status: AC
  Administered 2020-02-27: 2 [drp] via OPHTHALMIC
  Filled 2020-02-27: qty 4

## 2020-02-27 MED ORDER — OFLOXACIN 0.3 % OP SOLN
2.0000 [drp] | Freq: Four times a day (QID) | OPHTHALMIC | Status: DC
  Administered 2020-02-27: 2 [drp] via OPHTHALMIC
  Filled 2020-02-27: qty 5

## 2020-02-27 MED ORDER — TETANUS-DIPHTH-ACELL PERTUSSIS 5-2.5-18.5 LF-MCG/0.5 IM SUSP
0.5000 mL | Freq: Once | INTRAMUSCULAR | Status: AC
  Administered 2020-02-27: 0.5 mL via INTRAMUSCULAR
  Filled 2020-02-27: qty 0.5

## 2020-02-27 MED ORDER — FLUORESCEIN SODIUM 1 MG OP STRP
1.0000 | ORAL_STRIP | Freq: Once | OPHTHALMIC | Status: AC
  Administered 2020-02-27: 1 via OPHTHALMIC
  Filled 2020-02-27: qty 1

## 2020-02-27 NOTE — Discharge Instructions (Addendum)
To use your eye drops please place two drops in your left eye 4 times a day for 5 days.   Please take Tylenol (acetaminophen) to relieve your pain.  You may take tylenol, up to 1,000 mg (two extra strength pills).  Do not take more than 3,000 mg tylenol in a 24 hour period.  Please check all medication labels as many medications such as pain and cold medications may contain tylenol. Please do not drink alcohol while taking this medication.   I would recommend not using eye make up (mascara, liner, etc) as you do not want to injure your good eye.

## 2020-02-27 NOTE — ED Provider Notes (Signed)
[facility] DEPT Provider Note   CSN: [id number] Arrival date & time: 02/27/20  0915     History Chief Complaint  Patient presents with  . Eye Injury    left    [name] is a 79 y.o. female with a past medical history of polymyalgia rheumatica, temporal arteritis causing right eye blindness, diabetes, rheumatoid arthritis, stroke, presents today for evaluation of the left eye injury.  She is seen by Dr. Kathrin Penner at [facility] ophthalmology.  She reports that this morning she was putting on her make-up when her hand twitched causing her to strike her left eye with her fingernail.  She reports that she had a small amount of bleeding around the eye with pain in the eye itself.  She denies any vision changes in the left eye.  She has minimal vision in the right eye due to vision loss from temporal arteritis.  She denies any eye symptoms today prior to accident and striking herself in the eye.  She is unsure when her last tetanus shot was.  She denies any nausea or vomiting.    HPI     Past Medical History:  Diagnosis Date  . Allergic rhinitis   . Anemia   . Anxiety   . Bronchiectasis   . Carpal tunnel syndrome 07/13/2015   Bilateral  . CVA (cerebral infarction) 10/2007   Right thalamic   . Diverticulosis of colon   . Gait disorder   . GERD (gastroesophageal reflux disease)   . History of breast cancer   . HTN (hypertension)   . Hyperlipidemia   . Idiopathic pulmonary fibrosis   . Left knee DJD   . Migraine   . Osteoporosis   . Peripheral neuropathy   . PMR (polymyalgia rheumatica) (HCC)   . Polyneuropathy in other diseases classified elsewhere ([place]) 02/17/2013  . Previous back surgery 10/09/12  . Renal insufficiency   . Rheumatoid arthritis ([place])   . Seizure disorder ([place])   . Spondylosis, cervical, with myelopathy 08/31/2015   C3-4 myelopathy  . Temporal arteritis (HCC)    Right eye blind, on steroids per [facility]/ Dr [name]  . Type  II or unspecified type diabetes mellitus without mention of complication, not stated as uncontrolled    2nd to steriods  . Vitamin D deficiency     Patient Active Problem List   Diagnosis Date Noted  . Pain management 10/30/2017  . Rheumatoid arthritis ([place]) 04/25/2017  . Spondylosis, cervical, with myelopathy 08/31/2015  . Carpal tunnel syndrome 07/13/2015  . Obesity (BMI 30-39.9) 04/06/2014  . Shortness of breath 07/14/2013  . Bone cyst 07/14/2013  . Polyneuropathy in other diseases classified elsewhere ([place]) 02/17/2013  . Chronic back pain 12/09/2012  . Peripheral edema 09/25/2012  . Anemia due to chronic illness 09/01/2012  . Cervicalgia 07/11/2012  . Disturbance of skin sensation 07/11/2012  . Temporal arteritis ([place]) 04/01/2012  . Lower GI bleed 12/24/2011  . Rectal bleed 12/13/2011  . Chronic renal insufficiency, stage II (mild) 04/04/2011  . LUMBAR RADICULOPATHY, LEFT 04/18/2009  . Essential hypertension 12/30/2007  . SEIZURE DISORDER 12/30/2007  . CEREBROVASCULAR ACCIDENT, HX OF 12/30/2007  . Hyperlipidemia 10/08/2007  . GERD 10/08/2007  . DIVERTICULOSIS, COLON 10/08/2007  . BRONCHIECTASIS 06/26/2007  . PULMONARY FIBROSIS 05/17/2007  . Osteoporosis 05/17/2007  . BREAST CANCER, HX OF 05/17/2007  . MASTECTOMY, LEFT, HX OF 05/17/2007    Past Surgical History:  Procedure Laterality Date  . CARPAL TUNNEL RELEASE Right   . CATARACT  EXTRACTION     OS - Summer 2010  . CERVICAL FUSION  2010   4 rods and pins in place  . CHOLECYSTECTOMY    . COLONOSCOPY  12/15/2011   Procedure: COLONOSCOPY;  Surgeon: [name], MD;  Location: [facility];  Service: Endoscopy;  Laterality: N/A;  . LUMBAR FUSION  04/2010   W/Mechanical fixation - [place]  2013   rods in hips (to stabilize).  Marland Kitchen MASTECTOMY    . NECK SURGERY    . rheumatoid nodule removal    . SPINAL FUSION  07/2010   T10-L2 interbody fusion / [facility]  . TONSILLECTOMY    .  TOTAL KNEE ARTHROPLASTY     Left     OB History   No obstetric history on file.     Family History  Problem Relation Age of Onset  . Brain cancer Mother   . Hypertension Mother   . Arthritis Mother   . Dementia Father   . Hypertension Father   . Arthritis Father   . Breast cancer Sister   . Lung cancer Sister   . Lung disease Neg Hx   . Rheumatologic disease Neg Hx     Social History   Tobacco Use  . Smoking status: Never Smoker  . Smokeless tobacco: Never Used  . Tobacco comment: Father   Substance Use Topics  . Alcohol use: No    Alcohol/week: 0.0 standard drinks    Comment: heavy drinker until 1995 - sobriety with AA  . Drug use: No    Home Medications Prior to Admission medications   Medication Sig Start Date End Date Taking? Authorizing Provider  amLODipine (NORVASC) 5 MG tablet TAKE 1 TABLET DAILY 02/26/20   Burchette, Alinda Sierras, MD  Calcium Carb-Cholecalciferol (CALCIUM CARBONATE-VITAMIN D3 PO) Take 1 tablet by mouth daily    [provider]  cephALEXin (KEFLEX) 500 MG capsule Dental work 06/30/19   [provider]  cholecalciferol (VITAMIN D) 1000 UNITS tablet Take 1,000 Units by mouth daily.      [provider]  denosumab (PROLIA) 60 MG/ML SOLN Inject 60 mg into the skin every 6 (six) months.      [provider]  diclofenac sodium (VOLTAREN) 1 % GEL APPLY 2 GRAMS TOPICALLY FOUR TIMES A DAY 05/12/19   Kathrynn Ducking, MD  ferrous sulfate 325 (65 FE) MG tablet Take 325 mg by mouth 2 (two) times daily.     [provider]  furosemide (LASIX) 40 MG tablet TAKE 1 TABLET DAILY 10/23/19   Burchette, Alinda Sierras, MD  gabapentin (NEURONTIN) 300 MG capsule Take 2 capsules (600 mg total) by mouth 2 (two) times daily. 12/01/19   Kathrynn Ducking, MD  Golimumab [facility] ARIA IV) Inject into the vein. Takes infusion every 8 wks.    [provider]  Multiple Vitamins-Minerals (MULTIVITAMIN,TX-MINERALS) tablet Take 1 tablet by  mouth daily.      [provider]  MULTIPLE VITAMINS-MINERALS PO Take by mouth.    [provider]  oxyCODONE (OXYCONTIN) 10 mg 12 hr tablet Take 1 tablet (10 mg total) by mouth every 12 (twelve) hours. 01/08/20   Burchette, Alinda Sierras, MD  oxyCODONE (OXYCONTIN) 10 mg 12 hr tablet Take one tablet by mouth every 12 hours.  May refill in one month 01/08/20   Burchette, Alinda Sierras, MD  oxyCODONE (OXYCONTIN) 10 mg 12 hr tablet Take 1 tablet by mouth every 12 hours.  May refill  in 2 months 01/08/20   Burchette, Alinda Sierras, MD  Oxycodone HCl 10 MG TABS Take 1 tablet (10 mg total) by mouth every 6 (six) hours as needed. 10/20/19   Burchette, Alinda Sierras, MD  potassium chloride (KLOR-CON) 10 MEQ tablet TAKE 1 TABLET TWICE A DAY 12/19/19   Burchette, Alinda Sierras, MD  predniSONE (DELTASONE) 1 MG tablet TAKE 4 TABLETS DAILY WITH BREAKFAST 03/24/19   Kathrynn Ducking, MD  simvastatin (ZOCOR) 5 MG tablet TAKE 1 TABLET AT BEDTIME 04/21/19   Burchette, Alinda Sierras, MD  tizanidine (ZANAFLEX) 2 MG capsule Take 1 capsule (2 mg total) by mouth as needed for muscle spasms (take 1 at bedtime as needed for muscle spasms). 01/05/20   Kathrynn Ducking, MD  potassium chloride (K-DUR) 10 MEQ tablet Take 1 tablet (10 mEq total) by mouth 2 (two) times daily. 12/29/12 03/02/13  Norins, Heinz Knuckles, MD    Allergies    Hydromorphone  Review of Systems   Review of Systems  Constitutional: Negative for chills and fever.  HENT: Negative for facial swelling.   Eyes: Positive for pain, discharge and redness. Negative for photophobia, itching and visual disturbance.  Respiratory: Negative for chest tightness and shortness of breath.   Cardiovascular: Negative for chest pain.  Gastrointestinal: Negative for abdominal pain, diarrhea, nausea and vomiting.  Genitourinary: Negative for dysuria.  Musculoskeletal: Negative for back pain and neck pain.  Neurological: Negative for headaches.  Psychiatric/Behavioral: Negative for confusion.  All  other systems reviewed and are negative.   Physical Exam Updated Vital Signs BP 125/82 (BP Location: Right Arm)   Pulse 65   Temp 98.4 F (36.9 C) (Oral)   Resp 16   Ht 5\' 2"  (1.575 m)   SpO2 98%   BMI 26.08 kg/m   Physical Exam Vitals and nursing note reviewed.  Constitutional:      General: She is not in acute distress.    Appearance: She is well-developed. She is not diaphoretic.  HENT:     Head: Normocephalic and atraumatic.  Eyes:     General: Lids are normal. No scleral icterus.       Right eye: No discharge.        Left eye: No discharge.     Conjunctiva/sclera: Conjunctivae normal.     Comments: Left eye with significant subconjunctival hemorrhage.  Left eye fluorescein uptake approximately 0.5 cm diameter area at 6:00 inferior to the pupil/iris.  Left eye with negative Seidel sign.  Vision is grossly intact to finger counting in all 4 quadrants of left eye.   Left eye pupil is round, reactive to light.  Right eye is not reactive to light, consistent with baseline. No TTP over left temporal artery.   Cardiovascular:     Rate and Rhythm: Normal rate and regular rhythm.  Pulmonary:     Effort: Pulmonary effort is normal. No respiratory distress.     Breath sounds: No stridor.  Abdominal:     General: There is no distension.  Musculoskeletal:        General: No deformity.     Cervical back: Normal range of motion.  Skin:    General: Skin is warm and dry.  Neurological:     Mental Status: She is alert.     Motor: No abnormal muscle tone.  Psychiatric:        Behavior: Behavior normal.     ED Results / Procedures / Treatments   Labs (all labs ordered are listed, but only  abnormal results are displayed) Labs Reviewed - No data to display  EKG None  Radiology No results found.  Procedures Procedures (including critical care time)  Medications Ordered in ED Medications  ofloxacin (OCUFLOX) 0.3 % ophthalmic solution 2 drop (2 drops Left Eye Given  02/27/20 1144)  fluorescein ophthalmic strip 1 strip (1 strip Left Eye Given 02/27/20 1030)  tetracaine (PONTOCAINE) 0.5 % ophthalmic solution 2 drop (2 drops Left Eye Given 02/27/20 1031)  Tdap (BOOSTRIX) injection 0.5 mL (0.5 mLs Intramuscular Given 02/27/20 1144)    ED Course  I have reviewed the triage vital signs and the nursing notes.  Pertinent labs & imaging results that were available during my care of the patient were reviewed by me and considered in my medical decision making (see chart for details).  Clinical Course as of Feb 27 1307  Sat Feb 27, 2020  [place] ophthalmology and left message for on call MD.    [EH]  1048 I spoke with Dr. Ellie Lunch MD at [facility] ophthalmology, she recommends Occufloxacin qid, can see Monday at 41    [EH]    Clinical Course User Index [EH] Lorin Glass, PA-C     Visual Acuity  Right Eye Distance:   Left Eye Distance: 20/30 Bilateral Distance:    Right Eye Near:   Left Eye Near:    Bilateral Near:      MDM Rules/Calculators/A&P                     Patient is 79 year old woman who presents today for evaluation of a left eye injury after she was putting her make-up on and her hand twitched causing her to hit her left eye.  She has minimal useful vision in her right eye.  She denies any left eye pain or symptoms prior to the injury and reports her vision is at baseline in the left eye.  While she has a history of temporal arteritis, given that this is posttraumatic do not suspect temporal arteritis.  She does not have any tenderness over the left temporal artery.  Fluorescein stain shows a corneal abrasion inferior to the pupil.  She also has significant subconjunctival hemorrhage.  I spoke with Dr. Ellie Lunch, on-call for [facility] ophthalmology where patient is established.  She recommends Ocuflox eyedrops and will see the patient on Monday.  Chart review shows patient's last tetanus was in 2005, Tdap is updated.  Recommended  Tylenol as needed for pain.  Return precautions were discussed with patient who states their understanding.  At the time of discharge patient denied any unaddressed complaints or concerns.  Patient is agreeable for discharge home.  Note: Portions of this report may have been transcribed using voice recognition software. Every effort was made to ensure accuracy; however, inadvertent computerized transcription errors may be present   Final Clinical Impression(s) / ED Diagnoses Final diagnoses:  Abrasion of left cornea, initial encounter    Rx / DC Orders ED Discharge Orders    None       Lorin Glass, PA-C 02/27/20 1309    Sherwood Gambler, MD 02/27/20 1341

## 2020-02-27 NOTE — ED Triage Notes (Addendum)
Patient reports she was putting make up on this morning and scratched her left eye with fingernail. Patient has visible redness/injury to left eye. Denies pain. Denies any vision changes in left eye. Patient states she is nervous because this is her only good eye, she is blind in right eye.

## 2020-02-29 DIAGNOSIS — H1132 Conjunctival hemorrhage, left eye: Secondary | ICD-10-CM | POA: Diagnosis not present

## 2020-03-03 DIAGNOSIS — Z789 Other specified health status: Secondary | ICD-10-CM | POA: Diagnosis not present

## 2020-03-03 DIAGNOSIS — Z981 Arthrodesis status: Secondary | ICD-10-CM | POA: Diagnosis not present

## 2020-03-03 DIAGNOSIS — G8929 Other chronic pain: Secondary | ICD-10-CM | POA: Diagnosis not present

## 2020-03-03 DIAGNOSIS — Z7409 Other reduced mobility: Secondary | ICD-10-CM | POA: Diagnosis not present

## 2020-03-03 DIAGNOSIS — R29898 Other symptoms and signs involving the musculoskeletal system: Secondary | ICD-10-CM | POA: Diagnosis not present

## 2020-03-03 DIAGNOSIS — R262 Difficulty in walking, not elsewhere classified: Secondary | ICD-10-CM | POA: Diagnosis not present

## 2020-03-03 DIAGNOSIS — M545 Low back pain: Secondary | ICD-10-CM | POA: Diagnosis not present

## 2020-03-07 DIAGNOSIS — Z981 Arthrodesis status: Secondary | ICD-10-CM | POA: Diagnosis not present

## 2020-03-07 DIAGNOSIS — G8929 Other chronic pain: Secondary | ICD-10-CM | POA: Diagnosis not present

## 2020-03-07 DIAGNOSIS — R29898 Other symptoms and signs involving the musculoskeletal system: Secondary | ICD-10-CM | POA: Diagnosis not present

## 2020-03-07 DIAGNOSIS — Z7409 Other reduced mobility: Secondary | ICD-10-CM | POA: Diagnosis not present

## 2020-03-07 DIAGNOSIS — Z789 Other specified health status: Secondary | ICD-10-CM | POA: Diagnosis not present

## 2020-03-07 DIAGNOSIS — R262 Difficulty in walking, not elsewhere classified: Secondary | ICD-10-CM | POA: Diagnosis not present

## 2020-03-07 DIAGNOSIS — M545 Low back pain: Secondary | ICD-10-CM | POA: Diagnosis not present

## 2020-03-14 ENCOUNTER — Telehealth: Payer: Self-pay | Admitting: Family Medicine

## 2020-03-14 NOTE — Telephone Encounter (Signed)
Pt states she will call back to make an appt

## 2020-03-14 NOTE — Telephone Encounter (Signed)
pt said she has not been feeling well want to come in and have lab work done today

## 2020-03-15 ENCOUNTER — Other Ambulatory Visit: Payer: Self-pay

## 2020-03-16 ENCOUNTER — Encounter: Payer: Self-pay | Admitting: Family Medicine

## 2020-03-16 ENCOUNTER — Ambulatory Visit (INDEPENDENT_AMBULATORY_CARE_PROVIDER_SITE_OTHER): Payer: Medicare Other | Admitting: Family Medicine

## 2020-03-16 VITALS — BP 124/68 | HR 87 | Temp 97.8°F | Wt 144.5 lb

## 2020-03-16 DIAGNOSIS — R5383 Other fatigue: Secondary | ICD-10-CM | POA: Diagnosis not present

## 2020-03-16 DIAGNOSIS — R252 Cramp and spasm: Secondary | ICD-10-CM

## 2020-03-16 DIAGNOSIS — R202 Paresthesia of skin: Secondary | ICD-10-CM | POA: Diagnosis not present

## 2020-03-16 DIAGNOSIS — G63 Polyneuropathy in diseases classified elsewhere: Secondary | ICD-10-CM

## 2020-03-16 DIAGNOSIS — M810 Age-related osteoporosis without current pathological fracture: Secondary | ICD-10-CM | POA: Diagnosis not present

## 2020-03-16 DIAGNOSIS — Z79899 Other long term (current) drug therapy: Secondary | ICD-10-CM | POA: Diagnosis not present

## 2020-03-16 LAB — CBC WITH DIFFERENTIAL/PLATELET
Basophils Absolute: 0.1 10*3/uL (ref 0.0–0.1)
Basophils Relative: 0.9 % (ref 0.0–3.0)
Eosinophils Absolute: 0.6 10*3/uL (ref 0.0–0.7)
Eosinophils Relative: 6.9 % — ABNORMAL HIGH (ref 0.0–5.0)
HCT: 39.2 % (ref 36.0–46.0)
Hemoglobin: 13.6 g/dL (ref 12.0–15.0)
Lymphocytes Relative: 12.6 % (ref 12.0–46.0)
Lymphs Abs: 1 10*3/uL (ref 0.7–4.0)
MCHC: 34.6 g/dL (ref 30.0–36.0)
MCV: 94.9 fl (ref 78.0–100.0)
Monocytes Absolute: 0.4 10*3/uL (ref 0.1–1.0)
Monocytes Relative: 5 % (ref 3.0–12.0)
Neutro Abs: 6 10*3/uL (ref 1.4–7.7)
Neutrophils Relative %: 74.6 % (ref 43.0–77.0)
Platelets: 241 10*3/uL (ref 150.0–400.0)
RBC: 4.13 Mil/uL (ref 3.87–5.11)
RDW: 13.5 % (ref 11.5–15.5)
WBC: 8.1 10*3/uL (ref 4.0–10.5)

## 2020-03-16 LAB — BASIC METABOLIC PANEL
BUN: 31 mg/dL — ABNORMAL HIGH (ref 6–23)
CO2: 31 mEq/L (ref 19–32)
Calcium: 9.8 mg/dL (ref 8.4–10.5)
Chloride: 101 mEq/L (ref 96–112)
Creatinine, Ser: 1.32 mg/dL — ABNORMAL HIGH (ref 0.40–1.20)
GFR: 38.74 mL/min — ABNORMAL LOW (ref >60.00)
Glucose, Bld: 98 mg/dL (ref 70–99)
Potassium: 4.1 mEq/L (ref 3.5–5.1)
Sodium: 140 mEq/L (ref 135–145)

## 2020-03-16 LAB — MAGNESIUM: Magnesium: 2.2 mg/dL (ref 1.5–2.5)

## 2020-03-16 LAB — VITAMIN B12: Vitamin B-12: 508 pg/mL (ref 211–911)

## 2020-03-16 NOTE — Progress Notes (Signed)
Established Patient Office Visit  Subjective:  Patient ID: [name], female    DOB: 1941/08/17  Age: 79 y.o. MRN: [id number]  CC:  Chief Complaint  Patient presents with  . Fatigue    pt states she has been having fatigue and weakness wants to discuss potassium and  iron     HPI [name] presents for progressive fatigue over the past week or so.  She has history of hypertension, pulmonary fibrosis, polyneuropathy, osteoporosis, chronic back pain, remote history of breast cancer, rheumatoid arthritis history.  She states that on Monday she was doing her physical therapy and was on the treadmill and felt somewhat lightheaded.  No chest pain.  No increased dyspnea.  She has had general malaise even at rest.  She has chronic insomnia and only gets about 4 hours sleep per night but this has been chronic for her.  She had some progressive paresthesias of the feet.  She did have B12 level checked back in 2016 which was normal.  Recent thyroid functions were normal back in April.  No history of diabetes.  No recent tick bites.  No fever.  She is also having some increased muscle cramps.  She is concerned whether she could have "low iron "or low potassium what she has had in the past.  Past Medical History:  Diagnosis Date  . Allergic rhinitis   . Anemia   . Anxiety   . Bronchiectasis   . Carpal tunnel syndrome 07/13/2015   Bilateral  . CVA (cerebral infarction) 10/2007   Right thalamic   . Diverticulosis of colon   . Gait disorder   . GERD (gastroesophageal reflux disease)   . History of breast cancer   . HTN (hypertension)   . Hyperlipidemia   . Idiopathic pulmonary fibrosis   . Left knee DJD   . Migraine   . Osteoporosis   . Peripheral neuropathy   . PMR (polymyalgia rheumatica) (HCC)   . Polyneuropathy in other diseases classified elsewhere ([place]) 02/17/2013  . Previous back surgery 10/09/12  . Renal insufficiency   . Rheumatoid arthritis ([place])   . Seizure disorder  ([place])   . Spondylosis, cervical, with myelopathy 08/31/2015   C3-4 myelopathy  . Temporal arteritis (HCC)    Right eye blind, on steroids per [facility]/ Dr [name]  . Type II or unspecified type diabetes mellitus without mention of complication, not stated as uncontrolled    2nd to steriods  . Vitamin D deficiency     Past Surgical History:  Procedure Laterality Date  . CARPAL TUNNEL RELEASE Right   . CATARACT EXTRACTION     OS - Summer 2010  . CERVICAL FUSION  2010   4 rods and pins in place  . CHOLECYSTECTOMY    . COLONOSCOPY  12/15/2011   Procedure: COLONOSCOPY;  Surgeon: [name], MD;  Location: [facility];  Service: Endoscopy;  Laterality: N/A;  . LUMBAR FUSION  04/2010   W/Mechanical fixation - [place]  2013   rods in hips (to stabilize).  Marland Kitchen MASTECTOMY    . NECK SURGERY    . rheumatoid nodule removal    . SPINAL FUSION  07/2010   T10-L2 interbody fusion / [facility]  . TONSILLECTOMY    . TOTAL KNEE ARTHROPLASTY     Left    Family History  Problem Relation Age of Onset  . Brain cancer Mother   . Hypertension Mother   . Arthritis Mother   .  Dementia Father   . Hypertension Father   . Arthritis Father   . Breast cancer Sister   . Lung cancer Sister   . Lung disease Neg Hx   . Rheumatologic disease Neg Hx     Social History   Socioeconomic History  . Marital status: Widowed    Spouse name: Not on file  . Number of children: 1  . Years of education: 12+ coll.  . Highest education level: Not on file  Occupational History  . Occupation: Education officer, environmental: RETIRED    Comment: retired  Tobacco Use  . Smoking status: Never Smoker  . Smokeless tobacco: Never Used  . Tobacco comment: Father   Vaping Use  . Vaping Use: Never used  Substance and Sexual Activity  . Alcohol use: No    Alcohol/week: 0.0 standard drinks    Comment: heavy drinker until 1995 - sobriety with AA  . Drug  use: No  . Sexual activity: Not Currently  Other Topics Concern  . Not on file  Social History Narrative      Married - husband invalid with stroke - widowed in 2009. 1 chil. 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 ACP - not formerly discussed - wishes to be a full code.    Patient is right handed. Patient consumes tea 4  times daily.         Patient drinks about 4-5 cups of caffeine daily.   Patient is right handed.       [facility] (04/25/17):   Originally from [place], [place]. Previously lived in [place]. 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 No bird or mold exposure. No hot tub exposure. Has traveled to [place], [place], and [place] No pets currently. Enjoys going to the beach.       Social Determinants of Health   Financial Resource Strain:   . Difficulty of Paying Living Expenses:   Food Insecurity:   . Worried About Charity fundraiser in the Last Year:   . Arboriculturist in the Last Year:   Transportation Needs:   . Film/video editor (Medical):   Marland Kitchen Lack of Transportation (Non-Medical):   Physical Activity:   . Days of Exercise per Week:   . Minutes of Exercise per Session:   Stress:   . Feeling of Stress :   Social Connections:   . Frequency of Communication with Friends and Family:   . Frequency of Social Gatherings with Friends and Family:   . Attends Religious Services:   . Active Member of Clubs or Organizations:   . Attends Archivist Meetings:   Marland Kitchen Marital Status:   Intimate Partner Violence:   . Fear of Current or Ex-Partner:   . Emotionally Abused:   Marland Kitchen Physically Abused:   . Sexually Abused:     Outpatient Medications Prior to Visit  Medication Sig Dispense Refill  . amLODipine (NORVASC) 5 MG tablet TAKE 1 TABLET DAILY 90 tablet 3  . Calcium Carb-Cholecalciferol (CALCIUM CARBONATE-VITAMIN D3 PO) Take 1 tablet by mouth daily    . cephALEXin  (KEFLEX) 500 MG capsule Dental work    . cholecalciferol (VITAMIN D) 1000 UNITS tablet Take 1,000 Units by mouth daily.      Marland Kitchen denosumab (PROLIA) 60 MG/ML SOLN Inject 60 mg into the skin every 6 (six) months.      Marland Kitchen  diclofenac sodium (VOLTAREN) 1 % GEL APPLY 2 GRAMS TOPICALLY FOUR TIMES A DAY 400 g 6  . ferrous sulfate 325 (65 FE) MG tablet Take 325 mg by mouth 2 (two) times daily.     . furosemide (LASIX) 40 MG tablet TAKE 1 TABLET DAILY 90 tablet 3  . gabapentin (NEURONTIN) 300 MG capsule Take 2 capsules (600 mg total) by mouth 2 (two) times daily. 360 capsule 3  . Golimumab ([place] ARIA IV) Inject into the vein. Takes infusion every 8 wks.    . Multiple Vitamins-Minerals (MULTIVITAMIN,TX-MINERALS) tablet Take 1 tablet by mouth daily.      . MULTIPLE VITAMINS-MINERALS PO Take by mouth.    . oxyCODONE (OXYCONTIN) 10 mg 12 hr tablet Take 1 tablet (10 mg total) by mouth every 12 (twelve) hours. 60 tablet 0  . oxyCODONE (OXYCONTIN) 10 mg 12 hr tablet Take one tablet by mouth every 12 hours.  May refill in one month 60 tablet 0  . oxyCODONE (OXYCONTIN) 10 mg 12 hr tablet Take 1 tablet by mouth every 12 hours.  May refill in 2 months 60 tablet 0  . Oxycodone HCl 10 MG TABS Take 1 tablet (10 mg total) by mouth every 6 (six) hours as needed. 90 tablet 0  . potassium chloride (KLOR-CON) 10 MEQ tablet TAKE 1 TABLET TWICE A DAY 180 tablet 3  . predniSONE (DELTASONE) 1 MG tablet TAKE 4 TABLETS DAILY WITH BREAKFAST 360 tablet 3  . simvastatin (ZOCOR) 5 MG tablet TAKE 1 TABLET AT BEDTIME 90 tablet 3  . tizanidine (ZANAFLEX) 2 MG capsule Take 1 capsule (2 mg total) by mouth as needed for muscle spasms (take 1 at bedtime as needed for muscle spasms). 30 capsule 3   No facility-administered medications prior to visit.    Allergies  Allergen Reactions  . Hydromorphone Other (See Comments)    Cognitive changes  "made me unconscious"    ROS Review of Systems  Constitutional: Positive for fatigue.  Negative for appetite change, chills, fever and unexpected weight change.  Respiratory: Negative for cough and shortness of breath.   Cardiovascular: Negative for chest pain.  Gastrointestinal: Negative for abdominal pain, diarrhea, nausea and vomiting.  Genitourinary: Negative for dysuria.  Neurological: Positive for light-headedness.  Hematological: Negative for adenopathy.  Psychiatric/Behavioral: Negative for confusion.      Objective:    Physical Exam Vitals reviewed.  Constitutional:      Appearance: Normal appearance.  Cardiovascular:     Rate and Rhythm: Normal rate and regular rhythm.  Pulmonary:     Effort: Pulmonary effort is normal.     Breath sounds: Normal breath sounds.  Musculoskeletal:     Right lower leg: No edema.     Left lower leg: No edema.  Neurological:     General: No focal deficit present.     Mental Status: She is alert.     Cranial Nerves: No cranial nerve deficit.     Motor: No weakness.     Gait: Gait normal.     BP 124/68 (BP Location: Left Arm, Patient Position: Sitting, Cuff Size: Normal)   Pulse 87   Temp 97.8 F (36.6 C) (Temporal)   Wt 144 lb 8 oz (65.5 kg)   SpO2 94%   BMI 26.43 kg/m  Wt Readings from Last 3 Encounters:  03/16/20 144 lb 8 oz (65.5 kg)  01/08/20 142 lb 9.6 oz (64.7 kg)  01/05/20 147 lb (66.7 kg)     Health Maintenance Due  Topic Date Due  . Hepatitis C Screening  Never done  . OPHTHALMOLOGY EXAM  Never done  . FOOT EXAM  04/26/2018  . HEMOGLOBIN A1C  04/29/2018    There are no preventive care reminders to display for this patient.  Lab Results  Component Value Date   TSH 2.13 01/08/2020   Lab Results  Component Value Date   WBC 6.6 01/08/2020   HGB 13.5 01/08/2020   HCT 39.8 01/08/2020   MCV 93.8 01/08/2020   PLT 205.0 01/08/2020   Lab Results  Component Value Date   NA 142 01/08/2020   K 4.2 01/08/2020   CO2 31 01/08/2020   GLUCOSE 142 (H) 01/08/2020   BUN 21 01/08/2020   CREATININE  1.30 (H) 01/08/2020   BILITOT 0.8 04/25/2017   ALKPHOS 82 06/25/2018   AST 26 06/25/2018   ALT 20 06/25/2018   PROT 7.1 04/25/2017   ALBUMIN 4.0 04/25/2017   CALCIUM 9.1 01/08/2020   GFR 39.44 (L) 01/08/2020   Lab Results  Component Value Date   CHOL 147 04/03/2019   Lab Results  Component Value Date   HDL 72.40 04/03/2019   Lab Results  Component Value Date   LDLCALC 60 04/03/2019   Lab Results  Component Value Date   TRIG 74.0 04/03/2019   Lab Results  Component Value Date   CHOLHDL 2 04/03/2019   Lab Results  Component Value Date   HGBA1C 5.3 10/30/2017      Assessment & Plan:   Problem List Items Addressed This Visit      Unprioritized   Polyneuropathy in other diseases classified elsewhere ([place]) - Primary (Chronic)   Relevant Orders   Vitamin B12    Other Visit Diagnoses    Fatigue, unspecified type       Relevant Orders   Basic metabolic panel   CBC with Differential/Platelet   Magnesium   Paresthesia       Relevant Orders   Vitamin B12    She presents with nonspecific fatigue over the past week or so.  She is having some intermittent muscle cramps.  Etiology unclear.  She does have background of chronic insomnia but symptoms of increased fatigue have been relatively acute past week.  No chest pains  -Check further labs with magnesium level, basic metabolic panel, CBC, I32 level -Follow-up promptly for any chest pains, fever, dyspnea, or any other new concerning symptoms -Sleep hygiene discussed  No orders of the defined types were placed in this encounter.   Follow-up: No follow-ups on file.    Carolann Littler, MD

## 2020-03-16 NOTE — Patient Instructions (Signed)

## 2020-03-23 ENCOUNTER — Other Ambulatory Visit: Payer: Self-pay | Admitting: Family Medicine

## 2020-03-23 ENCOUNTER — Other Ambulatory Visit: Payer: Self-pay | Admitting: Neurology

## 2020-04-07 DIAGNOSIS — Q828 Other specified congenital malformations of skin: Secondary | ICD-10-CM | POA: Diagnosis not present

## 2020-04-07 DIAGNOSIS — M19079 Primary osteoarthritis, unspecified ankle and foot: Secondary | ICD-10-CM | POA: Diagnosis not present

## 2020-04-07 DIAGNOSIS — M79672 Pain in left foot: Secondary | ICD-10-CM | POA: Diagnosis not present

## 2020-04-12 ENCOUNTER — Other Ambulatory Visit: Payer: Self-pay

## 2020-04-12 ENCOUNTER — Encounter: Payer: Self-pay | Admitting: Family Medicine

## 2020-04-12 ENCOUNTER — Ambulatory Visit (INDEPENDENT_AMBULATORY_CARE_PROVIDER_SITE_OTHER): Payer: Medicare Other | Admitting: Family Medicine

## 2020-04-12 VITALS — BP 130/68 | HR 62 | Temp 97.7°F | Wt 142.2 lb

## 2020-04-12 DIAGNOSIS — E785 Hyperlipidemia, unspecified: Secondary | ICD-10-CM

## 2020-04-12 DIAGNOSIS — M545 Low back pain: Secondary | ICD-10-CM | POA: Diagnosis not present

## 2020-04-12 DIAGNOSIS — I10 Essential (primary) hypertension: Secondary | ICD-10-CM

## 2020-04-12 DIAGNOSIS — G8929 Other chronic pain: Secondary | ICD-10-CM

## 2020-04-12 MED ORDER — OXYCODONE HCL ER 10 MG PO T12A: EXTENDED_RELEASE_TABLET | ORAL | 0 refills | Status: DC

## 2020-04-12 MED ORDER — OXYCODONE HCL ER 10 MG PO T12A: 10.0000 mg | EXTENDED_RELEASE_TABLET | Freq: Two times a day (BID) | ORAL | 0 refills | Status: DC

## 2020-04-12 NOTE — Progress Notes (Signed)
Established Patient Office Visit  Subjective:  Patient ID: [name], female    DOB: November 14, 1940  Age: 79 y.o. MRN: [id number]  CC:  Chief Complaint  Patient presents with  . Follow-up    pt is here for 3 month follow up on pain management     HPI CARENA STREAM presents for chronic pain management and other issues as below  She has chronic back pain he has been on OxyContin 10 mg twice daily for several years.  Her back pain has been relatively stable.  She rarely supplements with 10 mg oxycodone.  Denies any constipation or other side effects.  She is due for refills at this time.  Last drug screen was last January.  She has problems with some deformity of the left foot apparently from old fracture.  She has recurrent callus had been trimmed by orthopedic foot specialist recently.  She apparently had a small underlying ulcer.  No history of peripheral vascular disease.  No history of diabetes.  She has hypertension treated with amlodipine and this is been stable.  No recent dizziness.  Recent electrolytes were stable.  She is on low-dose simvastatin for hyperlipidemia.  No significant myalgias  Past Medical History:  Diagnosis Date  . Allergic rhinitis   . Anemia   . Anxiety   . Bronchiectasis   . Carpal tunnel syndrome 07/13/2015   Bilateral  . CVA (cerebral infarction) 10/2007   Right thalamic   . Diverticulosis of colon   . Gait disorder   . GERD (gastroesophageal reflux disease)   . History of breast cancer   . HTN (hypertension)   . Hyperlipidemia   . Idiopathic pulmonary fibrosis   . Left knee DJD   . Migraine   . Osteoporosis   . Peripheral neuropathy   . PMR (polymyalgia rheumatica) (HCC)   . Polyneuropathy in other diseases classified elsewhere ([place]) 02/17/2013  . Previous back surgery 10/09/12  . Renal insufficiency   . Rheumatoid arthritis ([place])   . Seizure disorder ([place])   . Spondylosis, cervical, with myelopathy 08/31/2015   C3-4 myelopathy  .  Temporal arteritis (HCC)    Right eye blind, on steroids per [facility]/ Dr [name]  . Type II or unspecified type diabetes mellitus without mention of complication, not stated as uncontrolled    2nd to steriods  . Vitamin D deficiency     Past Surgical History:  Procedure Laterality Date  . CARPAL TUNNEL RELEASE Right   . CATARACT EXTRACTION     OS - Summer 2010  . CERVICAL FUSION  2010   4 rods and pins in place  . CHOLECYSTECTOMY    . COLONOSCOPY  12/15/2011   Procedure: COLONOSCOPY;  Surgeon: [name], MD;  Location: [facility];  Service: Endoscopy;  Laterality: N/A;  . LUMBAR FUSION  04/2010   W/Mechanical fixation - [place]  2013   rods in hips (to stabilize).  Marland Kitchen MASTECTOMY    . NECK SURGERY    . rheumatoid nodule removal    . SPINAL FUSION  07/2010   T10-L2 interbody fusion / [facility]  . TONSILLECTOMY    . TOTAL KNEE ARTHROPLASTY     Left    Family History  Problem Relation Age of Onset  . Brain cancer Mother   . Hypertension Mother   . Arthritis Mother   . Dementia Father   . Hypertension Father   . Arthritis Father   . Breast  cancer Sister   . Lung cancer Sister   . Lung disease Neg Hx   . Rheumatologic disease Neg Hx     Social History   Socioeconomic History  . Marital status: Widowed    Spouse name: Not on file  . Number of children: 1  . Years of education: 12+ coll.  . Highest education level: Not on file  Occupational History  . Occupation: Education officer, environmental: RETIRED    Comment: retired  Tobacco Use  . Smoking status: Never Smoker  . Smokeless tobacco: Never Used  . Tobacco comment: Father   Vaping Use  . Vaping Use: Never used  Substance and Sexual Activity  . Alcohol use: No    Alcohol/week: 0.0 standard drinks    Comment: heavy drinker until 1995 - sobriety with AA  . Drug use: No  . Sexual activity: Not Currently  Other Topics Concern  . Not on file    Social History Narrative      Married - husband invalid with stroke - widowed in 2009. 1 chil. 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 ACP - not formerly discussed - wishes to be a full code.    Patient is right handed. Patient consumes tea 4  times daily.         Patient drinks about 4-5 cups of caffeine daily.   Patient is right handed.       [facility] (04/25/17):   Originally from [place], [place]. Previously lived in [place]. 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 No bird or mold exposure. No hot tub exposure. Has traveled to [place], [place], and [place] No pets currently. Enjoys going to the beach.       Social Determinants of Health   Financial Resource Strain:   . Difficulty of Paying Living Expenses:   Food Insecurity:   . Worried About Charity fundraiser in the Last Year:   . Arboriculturist in the Last Year:   Transportation Needs:   . Film/video editor (Medical):   Marland Kitchen Lack of Transportation (Non-Medical):   Physical Activity:   . Days of Exercise per Week:   . Minutes of Exercise per Session:   Stress:   . Feeling of Stress :   Social Connections:   . Frequency of Communication with Friends and Family:   . Frequency of Social Gatherings with Friends and Family:   . Attends Religious Services:   . Active Member of Clubs or Organizations:   . Attends Archivist Meetings:   Marland Kitchen Marital Status:   Intimate Partner Violence:   . Fear of Current or Ex-Partner:   . Emotionally Abused:   Marland Kitchen Physically Abused:   . Sexually Abused:     Outpatient Medications Prior to Visit  Medication Sig Dispense Refill  . amLODipine (NORVASC) 5 MG tablet TAKE 1 TABLET DAILY 90 tablet 3  . Calcium Carb-Cholecalciferol (CALCIUM CARBONATE-VITAMIN D3 PO) Take 1 tablet by mouth daily    . cholecalciferol (VITAMIN D) 1000 UNITS tablet Take 1,000 Units by mouth daily.      Marland Kitchen  denosumab (PROLIA) 60 MG/ML SOLN Inject 60 mg into the skin every 6 (six) months.      . diclofenac sodium (VOLTAREN) 1 % GEL APPLY 2 GRAMS TOPICALLY FOUR TIMES A DAY 400 g 6  . ferrous sulfate 325 (65 FE) MG  tablet Take 325 mg by mouth 2 (two) times daily.     . furosemide (LASIX) 40 MG tablet TAKE 1 TABLET DAILY 90 tablet 3  . gabapentin (NEURONTIN) 300 MG capsule Take 2 capsules (600 mg total) by mouth 2 (two) times daily. 360 capsule 3  . Golimumab ([place] ARIA IV) Inject into the vein. Takes infusion every 8 wks.    . Multiple Vitamins-Minerals (MULTIVITAMIN,TX-MINERALS) tablet Take 1 tablet by mouth daily.      . MULTIPLE VITAMINS-MINERALS PO Take by mouth.    . Oxycodone HCl 10 MG TABS Take 1 tablet (10 mg total) by mouth every 6 (six) hours as needed. 90 tablet 0  . potassium chloride (KLOR-CON) 10 MEQ tablet TAKE 1 TABLET TWICE A DAY 180 tablet 3  . predniSONE (DELTASONE) 1 MG tablet TAKE 4 TABLETS DAILY WITH BREAKFAST 360 tablet 3  . simvastatin (ZOCOR) 5 MG tablet TAKE 1 TABLET AT BEDTIME 90 tablet 3  . tizanidine (ZANAFLEX) 2 MG capsule Take 1 capsule (2 mg total) by mouth as needed for muscle spasms (take 1 at bedtime as needed for muscle spasms). 30 capsule 3  . cephALEXin (KEFLEX) 500 MG capsule Dental work    . oxyCODONE (OXYCONTIN) 10 mg 12 hr tablet Take 1 tablet (10 mg total) by mouth every 12 (twelve) hours. 60 tablet 0  . oxyCODONE (OXYCONTIN) 10 mg 12 hr tablet Take one tablet by mouth every 12 hours.  May refill in one month 60 tablet 0  . oxyCODONE (OXYCONTIN) 10 mg 12 hr tablet Take 1 tablet by mouth every 12 hours.  May refill in 2 months 60 tablet 0   No facility-administered medications prior to visit.    Allergies  Allergen Reactions  . Hydromorphone Other (See Comments)    Cognitive changes  "made me unconscious"    ROS Review of Systems  Constitutional: Negative for fatigue and unexpected weight change.  Eyes: Negative for visual disturbance.    Respiratory: Negative for cough, chest tightness, shortness of breath and wheezing.   Cardiovascular: Negative for chest pain, palpitations and leg swelling.  Musculoskeletal: Positive for back pain.  Neurological: Negative for dizziness, seizures, syncope, weakness, light-headedness and headaches.      Objective:    Physical Exam Constitutional:      Appearance: She is well-developed.  Eyes:     Pupils: Pupils are equal, round, and reactive to light.  Neck:     Thyroid: No thyromegaly.     Vascular: No JVD.  Cardiovascular:     Rate and Rhythm: Normal rate and regular rhythm.     Heart sounds: No gallop.   Pulmonary:     Effort: Pulmonary effort is normal. No respiratory distress.     Breath sounds: Normal breath sounds. No wheezing or rales.  Musculoskeletal:     Cervical back: Neck supple.  Skin:    Comments: Left foot reveals callused area along the medial aspect of her arch.  This has been recently trimmed.  There is no signs of secondary infection.  Neurological:     Mental Status: She is alert.     BP 130/68 (BP Location: Left Arm, Patient Position: Sitting, Cuff Size: Normal)   Pulse 62   Temp 97.7 F (36.5 C) (Oral)   Wt 142 lb 3.2 oz (64.5 kg)   SpO2 95%   BMI 26.01 kg/m  Wt Readings from Last 3 Encounters:  04/12/20 142 lb 3.2 oz (64.5 kg)  03/16/20 144 lb 8 oz (65.5  kg)  01/08/20 142 lb 9.6 oz (64.7 kg)     Health Maintenance Due  Topic Date Due  . Hepatitis C Screening  Never done  . OPHTHALMOLOGY EXAM  Never done  . FOOT EXAM  04/26/2018  . HEMOGLOBIN A1C  04/29/2018    There are no preventive care reminders to display for this patient.  Lab Results  Component Value Date   TSH 2.13 01/08/2020   Lab Results  Component Value Date   WBC 8.1 03/16/2020   HGB 13.6 03/16/2020   HCT 39.2 03/16/2020   MCV 94.9 03/16/2020   PLT 241.0 03/16/2020   Lab Results  Component Value Date   NA 140 03/16/2020   K 4.1 03/16/2020   CO2 31 03/16/2020    GLUCOSE 98 03/16/2020   BUN 31 (H) 03/16/2020   CREATININE 1.32 (H) 03/16/2020   BILITOT 0.8 04/25/2017   ALKPHOS 82 06/25/2018   AST 26 06/25/2018   ALT 20 06/25/2018   PROT 7.1 04/25/2017   ALBUMIN 4.0 04/25/2017   CALCIUM 9.8 03/16/2020   GFR 38.74 (L) 03/16/2020   Lab Results  Component Value Date   CHOL 147 04/03/2019   Lab Results  Component Value Date   HDL 72.40 04/03/2019   Lab Results  Component Value Date   LDLCALC 60 04/03/2019   Lab Results  Component Value Date   TRIG 74.0 04/03/2019   Lab Results  Component Value Date   CHOLHDL 2 04/03/2019   Lab Results  Component Value Date   HGBA1C 5.3 10/30/2017      Assessment & Plan:   Problem List Items Addressed This Visit      Unprioritized   Hyperlipidemia   Essential hypertension - Primary   Chronic back pain   Relevant Medications   oxyCODONE (OXYCONTIN) 10 mg 12 hr tablet   oxyCODONE (OXYCONTIN) 10 mg 12 hr tablet   oxyCODONE (OXYCONTIN) 10 mg 12 hr tablet    Chronic back pain is stable.  Refilled OxyContin for 3 months.  Recommend 19-month follow-up.  [place] controlled drug registry is reviewed and there are no red flags.  She did inquire about possibly seeing foot specialist closer to home as she does not like driving in [place].  We had suggested that she consider tried foot center and she will consider  Meds ordered this encounter  Medications  . oxyCODONE (OXYCONTIN) 10 mg 12 hr tablet    Sig: Take one tablet by mouth every 12 hours.  May refill in one month    Dispense:  60 tablet    Refill:  0  . oxyCODONE (OXYCONTIN) 10 mg 12 hr tablet    Sig: Take 1 tablet by mouth every 12 hours.  May refill in 2 months    Dispense:  60 tablet    Refill:  0  . oxyCODONE (OXYCONTIN) 10 mg 12 hr tablet    Sig: Take 1 tablet (10 mg total) by mouth every 12 (twelve) hours.    Dispense:  60 tablet    Refill:  0    Follow-up: Return in about 3 months (around 07/13/2020).    Carolann Littler, MD

## 2020-04-25 DIAGNOSIS — M79672 Pain in left foot: Secondary | ICD-10-CM | POA: Diagnosis not present

## 2020-04-25 DIAGNOSIS — M19072 Primary osteoarthritis, left ankle and foot: Secondary | ICD-10-CM | POA: Diagnosis not present

## 2020-04-27 ENCOUNTER — Other Ambulatory Visit: Payer: Self-pay

## 2020-04-27 ENCOUNTER — Ambulatory Visit (INDEPENDENT_AMBULATORY_CARE_PROVIDER_SITE_OTHER): Payer: Medicare Other | Admitting: Family Medicine

## 2020-04-27 ENCOUNTER — Encounter: Payer: Self-pay | Admitting: Family Medicine

## 2020-04-27 VITALS — BP 134/72 | HR 75 | Temp 98.6°F | Wt 142.3 lb

## 2020-04-27 DIAGNOSIS — M25551 Pain in right hip: Secondary | ICD-10-CM | POA: Diagnosis not present

## 2020-04-27 MED ORDER — METHYLPREDNISOLONE ACETATE 80 MG/ML IJ SUSP
80.0000 mg | Freq: Once | INTRAMUSCULAR | Status: AC
  Administered 2020-04-27: 80 mg via INTRALESIONAL

## 2020-04-27 NOTE — Patient Instructions (Signed)
Hip Bursitis  Hip bursitis is inflammation of a fluid-filled sac (bursa) in the hip joint. The bursa prevents the bones in the hip joint from rubbing against each other. Hip bursitis can cause mild to moderate pain, and symptoms often come and go over time. What are the causes? This condition may be caused by:  Injury to the hip.  Overuse of the muscles that surround the hip joint.  Previous injury or surgery of the hip.  Arthritis or gout.  Diabetes.  Thyroid disease.  Infection. In some cases, the cause may not be known. What are the signs or symptoms? Symptoms of this condition include:  Mild or moderate pain in the hip area. Pain may get worse with movement.  Tenderness and swelling of the hip, especially on the outer side of the hip.  In rare cases, the bursa may become infected. This may cause a fever, as well as warmth and redness in the area. Symptoms may come and go. How is this diagnosed? This condition may be diagnosed based on:  A physical exam.  Your medical history.  X-rays.  Removal of fluid from your inflamed bursa for testing (biopsy). You may be sent to a health care provider who specializes in bone diseases (orthopedist) or a provider who specializes in joint inflammation (rheumatologist). How is this treated? This condition is treated by resting, icing, applying pressure (compression), and raising (elevating) the injured area. This is called RICE treatment. In some cases, this may be enough to make your symptoms go away. Treatment may also include:  Using crutches.  Draining fluid out of the bursa to help relieve swelling.  Injecting medicine that helps to reduce inflammation (cortisone).  Additional medicines if the bursa is infected. Follow these instructions at home: Managing pain, stiffness, and swelling   If directed, put ice on the painful area. ? Put ice in a plastic bag. ? Place a towel between your skin and the bag. ? Leave the ice  on for 20 minutes, 2-3 times a day. ? Raise (elevate) your hip above the level of your heart as much as you can without pain. To do this, try putting a pillow under your hips while you lie down. Activity  Return to your normal activities as told by your health care provider. Ask your health care provider what activities are safe for you.  Rest and protect your hip as much as possible until your pain and swelling get better. General instructions  Take over-the-counter and prescription medicines only as told by your health care provider.  Wear compression wraps only as told by your health care provider.  Do not use your hip to support your body weight until your health care provider says that you can. Use crutches as told by your health care provider.  Gently massage and stretch your injured area as often as is comfortable.  Keep all follow-up visits as told by your health care provider. This is important. How is this prevented?  Exercise regularly, as told by your health care provider.  Warm up and stretch before being active.  Cool down and stretch after being active.  If an activity irritates your hip or causes pain, avoid the activity as much as possible.  Avoid sitting down for long periods at a time. Contact a health care provider if you:  Have a fever.  Develop new symptoms.  Have difficulty walking or doing everyday activities.  Have pain that gets worse or does not get better with medicine.    Develop red skin or a feeling of warmth in your hip area. Get help right away if you:  Cannot move your hip.  Have severe pain. Summary  Hip bursitis is inflammation of a fluid-filled sac (bursa) in the hip joint.  Hip bursitis can cause mild to moderate pain, and symptoms often come and go over time.  This condition is treated with rest, ice, compression, elevation, and medicines. This information is not intended to replace advice given to you by your health care  provider. Make sure you discuss any questions you have with your health care provider. Document Revised: 05/19/2018 Document Reviewed: 05/19/2018 Elsevier Patient Education  2020 Elsevier Inc.  

## 2020-04-27 NOTE — Progress Notes (Signed)
Established Patient Office Visit  Subjective:  Patient ID: [name], female    DOB: December 24, 1940  Age: 79 y.o. MRN: [id number]  CC:  Chief Complaint  Patient presents with  . Hip Pain    right hip has been hurting for about a week     HPI  [name] presents for right lateral hip pain for about the past week.  Denies any injury.  Location is upper outer hip.  She tried some heat without relief.  Interfering with sleep.  She had x-rays of the right hip back in 2019 which did not show any major degenerative changes.  Patient did not try any icing for recurrent pain.  Pain is moderate to severe at times  Past Medical History:  Diagnosis Date  . Allergic rhinitis   . Anemia   . Anxiety   . Bronchiectasis   . Carpal tunnel syndrome 07/13/2015   Bilateral  . CVA (cerebral infarction) 10/2007   Right thalamic   . Diverticulosis of colon   . Gait disorder   . GERD (gastroesophageal reflux disease)   . History of breast cancer   . HTN (hypertension)   . Hyperlipidemia   . Idiopathic pulmonary fibrosis   . Left knee DJD   . Migraine   . Osteoporosis   . Peripheral neuropathy   . PMR (polymyalgia rheumatica) (HCC)   . Polyneuropathy in other diseases classified elsewhere ([place]) 02/17/2013  . Previous back surgery 10/09/12  . Renal insufficiency   . Rheumatoid arthritis ([place])   . Seizure disorder ([place])   . Spondylosis, cervical, with myelopathy 08/31/2015   C3-4 myelopathy  . Temporal arteritis (HCC)    Right eye blind, on steroids per [facility]/ Dr [name]  . Type II or unspecified type diabetes mellitus without mention of complication, not stated as uncontrolled    2nd to steriods  . Vitamin D deficiency     Past Surgical History:  Procedure Laterality Date  . CARPAL TUNNEL RELEASE Right   . CATARACT EXTRACTION     OS - Summer 2010  . CERVICAL FUSION  2010   4 rods and pins in place  . CHOLECYSTECTOMY    . COLONOSCOPY  12/15/2011   Procedure: COLONOSCOPY;   Surgeon: [name], MD;  Location: [facility];  Service: Endoscopy;  Laterality: N/A;  . LUMBAR FUSION  04/2010   W/Mechanical fixation - [place]  2013   rods in hips (to stabilize).  Marland Kitchen MASTECTOMY    . NECK SURGERY    . rheumatoid nodule removal    . SPINAL FUSION  07/2010   T10-L2 interbody fusion / [facility]  . TONSILLECTOMY    . TOTAL KNEE ARTHROPLASTY     Left    Family History  Problem Relation Age of Onset  . Brain cancer Mother   . Hypertension Mother   . Arthritis Mother   . Dementia Father   . Hypertension Father   . Arthritis Father   . Breast cancer Sister   . Lung cancer Sister   . Lung disease Neg Hx   . Rheumatologic disease Neg Hx     Social History   Socioeconomic History  . Marital status: Widowed    Spouse name: Not on file  . Number of children: 1  . Years of education: 12+ coll.  . Highest education level: Not on file  Occupational History  . Occupation: Land  Employer: RETIRED    Comment: retired  Tobacco Use  . Smoking status: Never Smoker  . Smokeless tobacco: Never Used  . Tobacco comment: Father   Vaping Use  . Vaping Use: Never used  Substance and Sexual Activity  . Alcohol use: No    Alcohol/week: 0.0 standard drinks    Comment: heavy drinker until 1995 - sobriety with AA  . Drug use: No  . Sexual activity: Not Currently  Other Topics Concern  . Not on file  Social History Narrative      Married - husband invalid with stroke - widowed in 2009. 1 chil. 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 ACP - not formerly discussed - wishes to be a full code.    Patient is right handed. Patient consumes tea 4  times daily.         Patient drinks about 4-5 cups of caffeine daily.   Patient is right handed.       [facility] (04/25/17):   Originally from [place], [place]. Previously lived in [place]. 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 No bird or mold exposure. No hot tub exposure. Has traveled to [place], [place], and [place] No pets currently. Enjoys going to the beach.       Social Determinants of Health   Financial Resource Strain:   . Difficulty of Paying Living Expenses:   Food Insecurity:   . Worried About Charity fundraiser in the Last Year:   . Arboriculturist in the Last Year:   Transportation Needs:   . Film/video editor (Medical):   Marland Kitchen Lack of Transportation (Non-Medical):   Physical Activity:   . Days of Exercise per Week:   . Minutes of Exercise per Session:   Stress:   . Feeling of Stress :   Social Connections:   . Frequency of Communication with Friends and Family:   . Frequency of Social Gatherings with Friends and Family:   . Attends Religious Services:   . Active Member of Clubs or Organizations:   . Attends Archivist Meetings:   Marland Kitchen Marital Status:   Intimate Partner Violence:   . Fear of Current or Ex-Partner:   . Emotionally Abused:   Marland Kitchen Physically Abused:   . Sexually Abused:     Outpatient Medications Prior to Visit  Medication Sig Dispense Refill  . amLODipine (NORVASC) 5 MG tablet TAKE 1 TABLET DAILY 90 tablet 3  . Calcium Carb-Cholecalciferol (CALCIUM CARBONATE-VITAMIN D3 PO) Take 1 tablet by mouth daily    . cholecalciferol (VITAMIN D) 1000 UNITS tablet Take 1,000 Units by mouth daily.      Marland Kitchen denosumab (PROLIA) 60 MG/ML SOLN Inject 60 mg into the skin every 6 (six) months.      . diclofenac sodium (VOLTAREN) 1 % GEL APPLY 2 GRAMS TOPICALLY FOUR TIMES A DAY 400 g 6  . ferrous sulfate 325 (65 FE) MG tablet Take 325 mg by mouth 2 (two) times daily.     . furosemide (LASIX) 40 MG tablet TAKE 1 TABLET DAILY 90 tablet 3  . gabapentin (NEURONTIN) 300 MG capsule Take 2 capsules (600 mg total) by mouth 2 (two) times daily. 360 capsule 3  . Golimumab ([place] ARIA IV) Inject into the vein. Takes  infusion every 8 wks.    . Multiple Vitamins-Minerals (MULTIVITAMIN,TX-MINERALS) tablet Take 1 tablet by mouth daily.      Marland Kitchen  MULTIPLE VITAMINS-MINERALS PO Take by mouth.    . oxyCODONE (OXYCONTIN) 10 mg 12 hr tablet Take one tablet by mouth every 12 hours.  May refill in one month 60 tablet 0  . oxyCODONE (OXYCONTIN) 10 mg 12 hr tablet Take 1 tablet by mouth every 12 hours.  May refill in 2 months 60 tablet 0  . oxyCODONE (OXYCONTIN) 10 mg 12 hr tablet Take 1 tablet (10 mg total) by mouth every 12 (twelve) hours. 60 tablet 0  . Oxycodone HCl 10 MG TABS Take 1 tablet (10 mg total) by mouth every 6 (six) hours as needed. 90 tablet 0  . potassium chloride (KLOR-CON) 10 MEQ tablet TAKE 1 TABLET TWICE A DAY 180 tablet 3  . predniSONE (DELTASONE) 1 MG tablet TAKE 4 TABLETS DAILY WITH BREAKFAST 360 tablet 3  . simvastatin (ZOCOR) 5 MG tablet TAKE 1 TABLET AT BEDTIME 90 tablet 3  . tizanidine (ZANAFLEX) 2 MG capsule Take 1 capsule (2 mg total) by mouth as needed for muscle spasms (take 1 at bedtime as needed for muscle spasms). 30 capsule 3   No facility-administered medications prior to visit.    Allergies  Allergen Reactions  . Hydromorphone Other (See Comments)    Cognitive changes  "made me unconscious"    ROS Review of Systems  Constitutional: Negative for chills and fever.  Musculoskeletal: Positive for arthralgias.  Skin: Negative for rash.      Objective:    Physical Exam Vitals reviewed.  Constitutional:      Appearance: Normal appearance.  Cardiovascular:     Rate and Rhythm: Normal rate and regular rhythm.  Pulmonary:     Effort: Pulmonary effort is normal.     Breath sounds: Normal breath sounds.  Musculoskeletal:     Right lower leg: No edema.     Left lower leg: No edema.     Comments: Good ROM right hip.  Tender over greater trochanteric bursa of right hip.    Neurological:     Mental Status: She is alert.     Comments: No focal LE weakness.      BP 134/72  (BP Location: Left Arm, Patient Position: Sitting, Cuff Size: Normal)   Pulse 75   Temp 98.6 F (37 C) (Oral)   Wt 142 lb 4.8 oz (64.5 kg)   SpO2 97%   BMI 26.03 kg/m  Wt Readings from Last 3 Encounters:  04/27/20 142 lb 4.8 oz (64.5 kg)  04/12/20 142 lb 3.2 oz (64.5 kg)  03/16/20 144 lb 8 oz (65.5 kg)     Health Maintenance Due  Topic Date Due  . Hepatitis C Screening  Never done  . OPHTHALMOLOGY EXAM  Never done  . FOOT EXAM  04/26/2018  . HEMOGLOBIN A1C  04/29/2018  . INFLUENZA VACCINE  04/24/2020    There are no preventive care reminders to display for this patient.  Lab Results  Component Value Date   TSH 2.13 01/08/2020   Lab Results  Component Value Date   WBC 8.1 03/16/2020   HGB 13.6 03/16/2020   HCT 39.2 03/16/2020   MCV 94.9 03/16/2020   PLT 241.0 03/16/2020   Lab Results  Component Value Date   NA 140 03/16/2020   K 4.1 03/16/2020   CO2 31 03/16/2020   GLUCOSE 98 03/16/2020   BUN 31 (H) 03/16/2020   CREATININE 1.32 (H) 03/16/2020   BILITOT 0.8 04/25/2017   ALKPHOS 82 06/25/2018   AST 26 06/25/2018   ALT 20  06/25/2018   PROT 7.1 04/25/2017   ALBUMIN 4.0 04/25/2017   CALCIUM 9.8 03/16/2020   GFR 38.74 (L) 03/16/2020   Lab Results  Component Value Date   CHOL 147 04/03/2019   Lab Results  Component Value Date   HDL 72.40 04/03/2019   Lab Results  Component Value Date   LDLCALC 60 04/03/2019   Lab Results  Component Value Date   TRIG 74.0 04/03/2019   Lab Results  Component Value Date   CHOLHDL 2 04/03/2019   Lab Results  Component Value Date   HGBA1C 5.3 10/30/2017      Assessment & Plan:   Right lateral hip pain.  Suspect trochanteric bursitis  -discussed risk of steroid injection including bruising, bleeding, low risk of infection.  Pt consented.  Prepped skin with betadine.  Using 25 gauge 1.5 inch needle injected depo-medrol 1cc and 2 cc of plain xylocaine.  Pt tolerated well  Indication: right hip bursitis.  Touch  base if not improved in one week.    Meds ordered this encounter  Medications  . methylPREDNISolone acetate (DEPO-MEDROL) injection 80 mg    Follow-up: No follow-ups on file.    Carolann Littler, MD

## 2020-05-11 DIAGNOSIS — M0589 Other rheumatoid arthritis with rheumatoid factor of multiple sites: Secondary | ICD-10-CM | POA: Diagnosis not present

## 2020-05-17 DIAGNOSIS — H04122 Dry eye syndrome of left lacrimal gland: Secondary | ICD-10-CM | POA: Diagnosis not present

## 2020-05-17 DIAGNOSIS — H0100B Unspecified blepharitis left eye, upper and lower eyelids: Secondary | ICD-10-CM | POA: Diagnosis not present

## 2020-06-13 ENCOUNTER — Ambulatory Visit: Payer: Medicare Other

## 2020-06-13 ENCOUNTER — Ambulatory Visit (INDEPENDENT_AMBULATORY_CARE_PROVIDER_SITE_OTHER): Payer: Medicare Other

## 2020-06-13 ENCOUNTER — Other Ambulatory Visit: Payer: Self-pay

## 2020-06-13 DIAGNOSIS — Z0000 Encounter for general adult medical examination without abnormal findings: Secondary | ICD-10-CM

## 2020-06-13 DIAGNOSIS — Z1211 Encounter for screening for malignant neoplasm of colon: Secondary | ICD-10-CM

## 2020-06-13 NOTE — Progress Notes (Signed)
Subjective:   [name] is a 79 y.o. female who presents for Medicare Annual (Subsequent) preventive examination.  I connected with [name]  today by telephone and verified that I am speaking with the correct person using two identifiers. Location patient: home Location provider: work Persons participating in the virtual visit: patient, provider.   I discussed the limitations, risks, security and privacy concerns of performing an evaluation and management service by telephone and the availability of in person appointments. I also discussed with the patient that there may be a patient responsible charge related to this service. The patient expressed understanding and verbally consented to this telephonic visit.    Interactive audio and video telecommunications were attempted between this provider and patient, however failed, due to patient having technical difficulties OR patient did not have access to video capability.  We continued and completed visit with audio only.     Review of Systems    N/A Cardiac Risk Factors include: advanced age (>10men, >32 women)     Objective:    There were no vitals filed for this visit. There is no height or weight on file to calculate BMI.  Advanced Directives 06/13/2020 12/30/2017 04/26/2017 09/03/2016 08/31/2015 06/22/2015 01/10/2015  Does Patient Have a Medical Advance Directive? Yes Yes Yes Yes Yes Yes Yes  Type of [name];Living [name] [name] - [name] [name] [name] [name]  Does patient want to make changes to medical advance directive? No - Patient declined - - - - - -  Copy of [name] in Chart? No - copy requested No - copy requested - No - copy requested - - No - copy requested  Pre-existing out of facility DNR  order (yellow form or pink MOST form) - - - - - - -    Current Medications (verified) Outpatient Encounter Medications as of 06/13/2020  Medication Sig  . amLODipine (NORVASC) 5 MG tablet TAKE 1 TABLET DAILY  . Calcium Carb-Cholecalciferol (CALCIUM CARBONATE-VITAMIN D3 PO) Take 1 tablet by mouth daily  . cholecalciferol (VITAMIN D) 1000 UNITS tablet Take 1,000 Units by mouth daily.    Marland Kitchen denosumab (PROLIA) 60 MG/ML SOLN Inject 60 mg into the skin every 6 (six) months.    . diclofenac sodium (VOLTAREN) 1 % GEL APPLY 2 GRAMS TOPICALLY FOUR TIMES A DAY  . ferrous sulfate 325 (65 FE) MG tablet Take 325 mg by mouth 2 (two) times daily.   . furosemide (LASIX) 40 MG tablet TAKE 1 TABLET DAILY  . gabapentin (NEURONTIN) 300 MG capsule Take 2 capsules (600 mg total) by mouth 2 (two) times daily.  . Golimumab ([place] ARIA IV) Inject into the vein. Takes infusion every 8 wks.  . Multiple Vitamins-Minerals (MULTIVITAMIN,TX-MINERALS) tablet Take 1 tablet by mouth daily.    . MULTIPLE VITAMINS-MINERALS PO Take by mouth.  . oxyCODONE (OXYCONTIN) 10 mg 12 hr tablet Take one tablet by mouth every 12 hours.  May refill in one month  . oxyCODONE (OXYCONTIN) 10 mg 12 hr tablet Take 1 tablet by mouth every 12 hours.  May refill in 2 months  . Oxycodone HCl 10 MG TABS Take 1 tablet (10 mg total) by mouth every 6 (six) hours as needed.  . potassium chloride (KLOR-CON) 10 MEQ tablet TAKE 1 TABLET TWICE A DAY  . predniSONE (DELTASONE) 1 MG tablet TAKE 4 TABLETS  DAILY WITH BREAKFAST  . tizanidine (ZANAFLEX) 2 MG capsule Take 1 capsule (2 mg total) by mouth as needed for muscle spasms (take 1 at bedtime as needed for muscle spasms).  Marland Kitchen oxyCODONE (OXYCONTIN) 10 mg 12 hr tablet Take 1 tablet (10 mg total) by mouth every 12 (twelve) hours.  . simvastatin (ZOCOR) 5 MG tablet TAKE 1 TABLET AT BEDTIME  . [DISCONTINUED] potassium chloride (K-DUR) 10 MEQ tablet Take 1 tablet (10 mEq total) by mouth 2 (two) times daily.   No  facility-administered encounter medications on file as of 06/13/2020.    Allergies (verified) Hydromorphone   History: Past Medical History:  Diagnosis Date  . Allergic rhinitis   . Anemia   . Anxiety   . Bronchiectasis   . Carpal tunnel syndrome 07/13/2015   Bilateral  . CVA (cerebral infarction) 10/2007   Right thalamic   . Diverticulosis of colon   . Gait disorder   . GERD (gastroesophageal reflux disease)   . History of breast cancer   . HTN (hypertension)   . Hyperlipidemia   . Idiopathic pulmonary fibrosis   . Left knee DJD   . Migraine   . Osteoporosis   . Peripheral neuropathy   . PMR (polymyalgia rheumatica) (HCC)   . Polyneuropathy in other diseases classified elsewhere ([place]) 02/17/2013  . Previous back surgery 10/09/12  . Renal insufficiency   . Rheumatoid arthritis ([place])   . Seizure disorder ([place])   . Spondylosis, cervical, with myelopathy 08/31/2015   C3-4 myelopathy  . Temporal arteritis (HCC)    Right eye blind, on steroids per [facility]/ Dr [name]  . Type II or unspecified type diabetes mellitus without mention of complication, not stated as uncontrolled    2nd to steriods  . Vitamin D deficiency    Past Surgical History:  Procedure Laterality Date  . CARPAL TUNNEL RELEASE Right   . CATARACT EXTRACTION     OS - Summer 2010  . CERVICAL FUSION  2010   4 rods and pins in place  . CHOLECYSTECTOMY    . COLONOSCOPY  12/15/2011   Procedure: COLONOSCOPY;  Surgeon: [name], MD;  Location: [facility];  Service: Endoscopy;  Laterality: N/A;  . LUMBAR FUSION  04/2010   W/Mechanical fixation - [place]  2013   rods in hips (to stabilize).  Marland Kitchen MASTECTOMY    . NECK SURGERY    . rheumatoid nodule removal    . SPINAL FUSION  07/2010   T10-L2 interbody fusion / [facility]  . TONSILLECTOMY    . TOTAL KNEE ARTHROPLASTY     Left   Family History  Problem Relation Age of Onset  . Brain cancer Mother   . Hypertension Mother    . Arthritis Mother   . Dementia Father   . Hypertension Father   . Arthritis Father   . Breast cancer Sister   . Lung cancer Sister   . Lung disease Neg Hx   . Rheumatologic disease Neg Hx    Social History   Socioeconomic History  . Marital status: Widowed    Spouse name: Not on file  . Number of children: 1  . Years of education: 12+ coll.  . Highest education level: Not on file  Occupational History  . Occupation: Education officer, environmental: RETIRED    Comment: retired  Tobacco Use  . Smoking status: Never Smoker  . Smokeless tobacco: Never Used  .  Tobacco comment: Father   Vaping Use  . Vaping Use: Never used  Substance and Sexual Activity  . Alcohol use: No    Alcohol/week: 0.0 standard drinks    Comment: heavy drinker until 1995 - sobriety with AA  . Drug use: No  . Sexual activity: Not Currently  Other Topics Concern  . Not on file  Social History Narrative      Married - husband invalid with stroke - widowed in 2009. 1 chil. 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 ACP - not formerly discussed - wishes to be a full code.    Patient is right handed. Patient consumes tea 4  times daily.         Patient drinks about 4-5 cups of caffeine daily.   Patient is right handed.       [facility] (04/25/17):   Originally from [place], [place]. Previously lived in [place]. 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 No bird or mold exposure. No hot tub exposure. Has traveled to [place], [place], and [place] No pets currently. Enjoys going to the beach.       Social Determinants of Health   Financial Resource Strain:   . Difficulty of Paying Living Expenses: Not on file  Food Insecurity:   . Worried About Charity fundraiser in the Last Year: Not on file  . Ran Out of Food in the Last Year: Not on file  Transportation Needs:   . Lack of  Transportation (Medical): Not on file  . Lack of Transportation (Non-Medical): Not on file  Physical Activity:   . Days of Exercise per Week: Not on file  . Minutes of Exercise per Session: Not on file  Stress:   . Feeling of Stress : Not on file  Social Connections: Moderately Integrated  . Frequency of Communication with Friends and Family: More than three times a week  . Frequency of Social Gatherings with Friends and Family: Once a week  . Attends Religious Services: 1 to 4 times per year  . Active Member of Clubs or Organizations: Yes  . Attends Archivist Meetings: More than 4 times per year  . Marital Status: Widowed    Tobacco Counseling Counseling given: Not Answered Comment: Father    Clinical Intake:  Pre-visit preparation completed: Yes  Pain : No/denies pain     Nutritional Risks: None Diabetes: No  How often do you need to have someone help you when you read instructions, pamphlets, or other written materials from your doctor or pharmacy?: 1 - Never What is the last grade level you completed in school?: Some College  Diabetic?No  Interpreter Needed?: No  Information entered by :: [place] of Daily Living In your present state of health, do you have any difficulty performing the following activities: 06/13/2020  Hearing? N  Vision? Y  Comment Patient is blind in right eye  Difficulty concentrating or making decisions? Y  Comment has occassional issues with memory  Walking or climbing stairs? Y  Comment uses a walker  Dressing or bathing? N  Doing errands, shopping? N  Preparing Food and eating ? N  Using the Toilet? N  In the past six months, have you accidently leaked urine? N  Do you have problems with loss of bowel control? N  Managing your Medications? N  Managing your Finances? N  Housekeeping or managing your Housekeeping? N  Some recent data might be hidden    Patient Care Team: Eulas Post, MD as PCP -  General (Family Medicine) Kathrynn Ducking, MD (Neurology) Hennie Duos, MD as Consulting Physician (Rheumatology)  Indicate any recent Medical Services you may have received from other than [facility] providers in the past year (date may be approximate).     Assessment:   This is a routine wellness examination for [name].  Hearing/Vision screen  Hearing Screening   125Hz  250Hz  500Hz  1000Hz  2000Hz  3000Hz  4000Hz  6000Hz  8000Hz   Right ear:           Left ear:           Vision Screening Comments: Patient states gets eye checked twice a year, patient is blind in right eye   Dietary issues and exercise activities discussed: Current Exercise Habits: The patient does not participate in regular exercise at present  Goals    . Eat more fruits and vegetables     Would like to increase her vegetables K and w can pick up vegetables for a week    . Exercise 3x per week (30 min per time)      Depression Screen PHQ 2/9 Scores 06/13/2020 04/12/2020 11/19/2018 04/26/2017 12/31/2016 11/03/2015 08/29/2015  PHQ - 2 Score 0 0 0 0 0 0 0  PHQ- 9 Score 0 - 0 - - - -    Fall Risk Fall Risk  06/13/2020 04/12/2020 01/08/2020 11/19/2018 03/19/2018  Falls in the past year? 1 1 1  0 No  Number falls in past yr: 1 1 1  - -  Injury with Fall? 0 1 1 - -  Risk for fall due to : Impaired balance/gait;Medication side effect History of fall(s) History of fall(s);Impaired balance/gait - -  Follow up Falls evaluation completed;Falls prevention discussed Falls evaluation completed Falls evaluation completed - -    Any stairs in or around the home? Yes  If so, are there any without handrails? No  Home free of loose throw rugs in walkways, pet beds, electrical cords, etc? Yes  Adequate lighting in your home to reduce risk of falls? Yes   ASSISTIVE DEVICES UTILIZED TO PREVENT FALLS:  Life alert? No  Use of a cane, walker or w/c? Yes  Grab bars in the bathroom? Yes  Shower chair or bench in shower? Yes  Elevated toilet seat  or a handicapped toilet? Yes   Cognitive Function:     6CIT Screen 06/13/2020  What Year? 0 points  What month? 0 points  What time? 0 points  Count back from 20 0 points  Months in reverse 0 points  Repeat phrase 0 points  Total Score 0    Immunizations Immunization History  Administered Date(s) Administered  . Fluad Quad(high Dose 65+) 06/30/2019  . H1N1 10/19/2008  . Influenza Split 07/20/2012  . Influenza Whole 06/28/2006, 07/08/2007, 07/07/2008, 07/07/2009  . Influenza, High Dose Seasonal PF 07/14/2013, 07/30/2017, 07/02/2018  . Influenza,inj,Quad PF,6+ Mos 07/07/2014  . Influenza-Unspecified 07/15/2015, 07/24/2016  . PFIZER SARS-COV-2 Vaccination 11/07/2019, 11/29/2019  . Pneumococcal Conjugate-13 07/28/2013  . Pneumococcal Polysaccharide-23 06/28/2006  . Td 10/28/2003  . Tdap 02/27/2020  . Zoster 07/07/2008    TDAP status: Up to date Flu Vaccine status: Up to date Pneumococcal vaccine status: Up to date Covid-19 vaccine status: Completed vaccines  Qualifies for Shingles Vaccine? Yes   Zostavax completed Yes   Shingrix Completed?: No.    Education has been provided regarding the importance of this vaccine. Patient has been advised  to call insurance company to determine out of pocket expense if they have not yet received this vaccine. Advised may also receive vaccine at local pharmacy or Health Dept. Verbalized acceptance and understanding.  Screening Tests Health Maintenance  Topic Date Due  . OPHTHALMOLOGY EXAM  Never done  . FOOT EXAM  04/26/2018  . HEMOGLOBIN A1C  04/29/2018  . INFLUENZA VACCINE  04/24/2020  . MAMMOGRAM  10/11/2020  . TETANUS/TDAP  02/26/2030  . DEXA SCAN  Completed  . COVID-19 Vaccine  Completed  . PNA vac Low Risk Adult  Completed    Health Maintenance  Health Maintenance Due  Topic Date Due  . OPHTHALMOLOGY EXAM  Never done  . FOOT EXAM  04/26/2018  . HEMOGLOBIN A1C  04/29/2018  . INFLUENZA VACCINE  04/24/2020    Colorectal  cancer screening: Referral to GI placed 06/13/2020. Pt aware the office [name] call re: appt. Mammogram status: Completed 10/12/2019. Repeat every year Bone Density status: Completed 07/22/2019. Results reflect: Bone density results: OSTEOPENIA. Repeat every 2 years.  Lung Cancer Screening: (Low Dose CT Chest recommended if Age 31-80 years, 30 pack-year currently smoking OR have quit w/in 15years.) does not qualify.   Lung Cancer Screening Referral: N/A  Additional Screening:  Hepatitis C Screening: does not qualify;   Vision Screening: Recommended annual ophthalmology exams for early detection of glaucoma and other disorders of the eye. Is the patient up to date with their annual eye exam?  Yes  Who is the provider or what is the name of the office in which the patient attends annual eye exams? Patient states that she has  If pt is not established with a provider, would they like to be referred to a provider to establish care? No .   Dental Screening: Recommended annual dental exams for proper oral hygiene  Community Resource Referral / Chronic Care Management: CRR required this visit?  No   CCM required this visit?  No      Plan:     I have personally reviewed and noted the following in the patient's chart:   . Medical and social history . Use of alcohol, tobacco or illicit drugs  . Current medications and supplements . Functional ability and status . Nutritional status . Physical activity . Advanced directives . List of other physicians . Hospitalizations, surgeries, and ER visits in previous 12 months . Vitals . Screenings to include cognitive, depression, and falls . Referrals and appointments  In addition, I have reviewed and discussed with patient certain preventive protocols, quality metrics, and best practice recommendations. A written personalized care plan for preventive services as well as general preventive health recommendations were provided to patient.      Ofilia Neas, LPN   [id number]   Nurse Notes: None

## 2020-06-13 NOTE — Patient Instructions (Addendum)
[name] , Thank you for taking time to come for your Medicare Wellness Visit. I appreciate your ongoing commitment to your health goals. Please review the following plan we discussed and let me know if I can assist you in the future.   Screening recommendations/referrals: Colonoscopy: Currently due, orders placed today for GI Mammogram: Up to date next due 10/11/2020 Bone Density: Up to date, next due 07/21/2021 Recommended yearly ophthalmology/optometry visit for glaucoma screening and checkup Recommended yearly dental visit for hygiene and checkup  Vaccinations: Influenza vaccine: Currently due, for fall 2021-2022 flu vaccine Pneumococcal vaccine: completed series Tdap vaccine: Up to date, next due 02/26/2030 Shingles vaccine: Currently due for shingrix, if you would like to receive please contact your pharmacy to discuss cost and to receive vaccines   Advanced directives: Please bring a copy of your medical advanced directives into the office so that we may scan them into your chart.  Conditions/risks identified: None   Next appointment: 07/13/2020 @ 10:15 am with Dr. Elease Hashimoto    Preventive Care 65 Years and Older, Female Preventive care refers to lifestyle choices and visits with your health care provider that can promote health and wellness. What does preventive care include?  A yearly physical exam. This is also called an annual well check.  Dental exams once or twice a year.  Routine eye exams. Ask your health care provider how often you should have your eyes checked.  Personal lifestyle choices, including:  Daily care of your teeth and gums.  Regular physical activity.  Eating a healthy diet.  Avoiding tobacco and drug use.  Limiting alcohol use.  Practicing safe sex.  Taking low-dose aspirin every day.  Taking vitamin and mineral supplements as recommended by your health care provider. What happens during an annual well check? The services and  screenings done by your health care provider during your annual well check will depend on your age, overall health, lifestyle risk factors, and family history of disease. Counseling  Your health care provider may ask you questions about your:  Alcohol use.  Tobacco use.  Drug use.  Emotional well-being.  Home and relationship well-being.  Sexual activity.  Eating habits.  History of falls.  Memory and ability to understand (cognition).  Work and work Statistician.  Reproductive health. Screening  You may have the following tests or measurements:  Height, weight, and BMI.  Blood pressure.  Lipid and cholesterol levels. These may be checked every 5 years, or more frequently if you are over 26 years old.  Skin check.  Lung cancer screening. You may have this screening every year starting at age 56 if you have a 30-pack-year history of smoking and currently smoke or have quit within the past 15 years.  Fecal occult blood test (FOBT) of the stool. You may have this test every year starting at age 110.  Flexible sigmoidoscopy or colonoscopy. You may have a sigmoidoscopy every 5 years or a colonoscopy every 10 years starting at age 48.  Hepatitis C blood test.  Hepatitis B blood test.  Sexually transmitted disease (STD) testing.  Diabetes screening. This is done by checking your blood sugar (glucose) after you have not eaten for a while (fasting). You may have this done every 1-3 years.  Bone density scan. This is done to screen for osteoporosis. You may have this done starting at age 74.  Mammogram. This may be done every 1-2 years. Talk to your health care provider about how often you should have regular mammograms.  Talk with your health care provider about your test results, treatment options, and if necessary, the need for more tests. Vaccines  Your health care provider may recommend certain vaccines, such as:  Influenza vaccine. This is recommended every  year.  Tetanus, diphtheria, and acellular pertussis (Tdap, Td) vaccine. You may need a Td booster every 10 years.  Zoster vaccine. You may need this after age 42.  Pneumococcal 13-valent conjugate (PCV13) vaccine. One dose is recommended after age 87.  Pneumococcal polysaccharide (PPSV23) vaccine. One dose is recommended after age 55. Talk to your health care provider about which screenings and vaccines you need and how often you need them. This information is not intended to replace advice given to you by your health care provider. Make sure you discuss any questions you have with your health care provider. Document Released: 10/07/2015 Document Revised: 05/30/2016 Document Reviewed: 07/12/2015 Elsevier Interactive Patient Education  2017 [place] Prevention in the Home Falls can cause injuries. They can happen to people of all ages. There are many things you can do to make your home safe and to help prevent falls. What can I do on the outside of my home?  Regularly fix the edges of walkways and driveways and fix any cracks.  Remove anything that might make you trip as you walk through a door, such as a raised step or threshold.  Trim any bushes or trees on the path to your home.  Use bright outdoor lighting.  Clear any walking paths of anything that might make someone trip, such as rocks or tools.  Regularly check to see if handrails are loose or broken. Make sure that both sides of any steps have handrails.  Any raised decks and porches should have guardrails on the edges.  Have any leaves, snow, or ice cleared regularly.  Use sand or salt on walking paths during winter.  Clean up any spills in your garage right away. This includes oil or grease spills. What can I do in the bathroom?  Use night lights.  Install grab bars by the toilet and in the tub and shower. Do not use towel bars as grab bars.  Use non-skid mats or decals in the tub or shower.  If you  need to sit down in the shower, use a plastic, non-slip stool.  Keep the floor dry. Clean up any water that spills on the floor as soon as it happens.  Remove soap buildup in the tub or shower regularly.  Attach bath mats securely with double-sided non-slip rug tape.  Do not have throw rugs and other things on the floor that can make you trip. What can I do in the bedroom?  Use night lights.  Make sure that you have a light by your bed that is easy to reach.  Do not use any sheets or blankets that are too big for your bed. They should not hang down onto the floor.  Have a firm chair that has side arms. You can use this for support while you get dressed.  Do not have throw rugs and other things on the floor that can make you trip. What can I do in the kitchen?  Clean up any spills right away.  Avoid walking on wet floors.  Keep items that you use a lot in easy-to-reach places.  If you need to reach something above you, use a strong step stool that has a grab bar.  Keep electrical cords out of the way.  Do not use floor polish or wax that makes floors slippery. If you must use wax, use non-skid floor wax.  Do not have throw rugs and other things on the floor that can make you trip. What can I do with my stairs?  Do not leave any items on the stairs.  Make sure that there are handrails on both sides of the stairs and use them. Fix handrails that are broken or loose. Make sure that handrails are as long as the stairways.  Check any carpeting to make sure that it is firmly attached to the stairs. Fix any carpet that is loose or worn.  Avoid having throw rugs at the top or bottom of the stairs. If you do have throw rugs, attach them to the floor with carpet tape.  Make sure that you have a light switch at the top of the stairs and the bottom of the stairs. If you do not have them, ask someone to add them for you. What else can I do to help prevent falls?  Wear shoes  that:  Do not have high heels.  Have rubber bottoms.  Are comfortable and fit you well.  Are closed at the toe. Do not wear sandals.  If you use a stepladder:  Make sure that it is fully opened. Do not climb a closed stepladder.  Make sure that both sides of the stepladder are locked into place.  Ask someone to hold it for you, if possible.  Clearly mark and make sure that you can see:  Any grab bars or handrails.  First and last steps.  Where the edge of each step is.  Use tools that help you move around (mobility aids) if they are needed. These include:  Canes.  Walkers.  Scooters.  Crutches.  Turn on the lights when you go into a dark area. Replace any light bulbs as soon as they burn out.  Set up your furniture so you have a clear path. Avoid moving your furniture around.  If any of your floors are uneven, fix them.  If there are any pets around you, be aware of where they are.  Review your medicines with your doctor. Some medicines can make you feel dizzy. This can increase your chance of falling. Ask your doctor what other things that you can do to help prevent falls. This information is not intended to replace advice given to you by your health care provider. Make sure you discuss any questions you have with your health care provider. Document Released: 07/07/2009 Document Revised: 02/16/2016 Document Reviewed: 10/15/2014 Elsevier Interactive Patient Education  2017 [employer].

## 2020-06-16 ENCOUNTER — Telehealth: Payer: Self-pay | Admitting: Family Medicine

## 2020-06-16 DIAGNOSIS — M79672 Pain in left foot: Secondary | ICD-10-CM

## 2020-06-16 NOTE — Telephone Encounter (Signed)
Pt will like a referral for a colonoscopy. The health coach recommends she have one. And want a referral for an orthopedic for her foot in case she needs to see one.

## 2020-06-16 NOTE — Telephone Encounter (Signed)
Spoke with the pt and informed her the GI referral was placed and someone will call with appt info.  Patient stated she has been by an orthopedic surgeon Dr Dahlia Client,  due to fallen arches, callous and stated she would like a second opinion.  Patient is aware the ortho referral was placed and someone will call with appt info for this also.

## 2020-06-16 NOTE — Telephone Encounter (Signed)
GI referral already placed.  Don't know if she has specific orthopedist in mind, but OK to refer.

## 2020-06-22 ENCOUNTER — Telehealth: Payer: Self-pay | Admitting: Orthopedic Surgery

## 2020-06-22 NOTE — Telephone Encounter (Signed)
Spoke with Patient's son Dr. Cleophas Dunker he advised he received a call. I called patient mother left message to return call to schedule an appointment for left foot pain. Patient was referred to [place]

## 2020-06-28 DIAGNOSIS — Q828 Other specified congenital malformations of skin: Secondary | ICD-10-CM | POA: Diagnosis not present

## 2020-06-28 DIAGNOSIS — L851 Acquired keratosis [keratoderma] palmaris et plantaris: Secondary | ICD-10-CM | POA: Diagnosis not present

## 2020-06-28 DIAGNOSIS — M898X7 Other specified disorders of bone, ankle and foot: Secondary | ICD-10-CM | POA: Insufficient documentation

## 2020-06-28 DIAGNOSIS — M19079 Primary osteoarthritis, unspecified ankle and foot: Secondary | ICD-10-CM | POA: Diagnosis not present

## 2020-06-28 DIAGNOSIS — M79672 Pain in left foot: Secondary | ICD-10-CM | POA: Diagnosis not present

## 2020-07-06 DIAGNOSIS — M0589 Other rheumatoid arthritis with rheumatoid factor of multiple sites: Secondary | ICD-10-CM | POA: Diagnosis not present

## 2020-07-06 DIAGNOSIS — Z79899 Other long term (current) drug therapy: Secondary | ICD-10-CM | POA: Diagnosis not present

## 2020-07-07 ENCOUNTER — Ambulatory Visit: Payer: Medicare Other | Admitting: Orthopedic Surgery

## 2020-07-13 ENCOUNTER — Other Ambulatory Visit: Payer: Self-pay

## 2020-07-13 ENCOUNTER — Ambulatory Visit (INDEPENDENT_AMBULATORY_CARE_PROVIDER_SITE_OTHER): Payer: Medicare Other | Admitting: Family Medicine

## 2020-07-13 ENCOUNTER — Encounter: Payer: Self-pay | Admitting: Family Medicine

## 2020-07-13 VITALS — BP 104/60 | HR 82 | Temp 98.1°F | Ht 62.0 in | Wt 144.1 lb

## 2020-07-13 DIAGNOSIS — R52 Pain, unspecified: Secondary | ICD-10-CM | POA: Diagnosis not present

## 2020-07-13 DIAGNOSIS — M898X7 Other specified disorders of bone, ankle and foot: Secondary | ICD-10-CM | POA: Diagnosis not present

## 2020-07-13 DIAGNOSIS — I10 Essential (primary) hypertension: Secondary | ICD-10-CM

## 2020-07-13 DIAGNOSIS — Z23 Encounter for immunization: Secondary | ICD-10-CM | POA: Diagnosis not present

## 2020-07-13 MED ORDER — OXYCODONE HCL ER 10 MG PO T12A: EXTENDED_RELEASE_TABLET | ORAL | 0 refills | Status: DC

## 2020-07-13 MED ORDER — OXYCODONE HCL 10 MG PO TABS: 10.0000 mg | ORAL_TABLET | Freq: Four times a day (QID) | ORAL | 0 refills | Status: DC | PRN

## 2020-07-13 MED ORDER — OXYCODONE HCL ER 10 MG PO T12A: 10.0000 mg | EXTENDED_RELEASE_TABLET | Freq: Two times a day (BID) | ORAL | 0 refills | Status: DC

## 2020-07-13 NOTE — Progress Notes (Signed)
Established Patient Office Visit  Subjective:  Patient ID: [name], female    DOB: [birth year]  Age: 79 y.o. MRN: [id number]  CC:  Chief Complaint  Patient presents with  . Follow-up    3 MONTH follow up     HPI [name] presents for presurgical evaluation and follow-up regarding her chronic pain management.  Her chronic problems include hypertension, history of temporal arteritis, pulmonary fibrosis, chronic low back pain and neck pain with multiple prior back surgeries, rheumatoid arthritis, and osteoporosis.  She has chronic [name] disease followed by nephrology.  Remote history of breast cancer with previous left mastectomy.  She has history of lower extremity edema and is on Lasix and potassium.  She had recent electrolytes done in June which were stable  She has prominent exostosis left foot with overlying callus and ulcer is scheduled for debridement surgery over in [place] soon.  She needs preoperative forms signed.  No history of diabetes.  No history of peripheral vascular disease.  Indication for chronic opioid: Chronic back pain Medication and dose: OxyContin 12 mg every 12 hours.  She takes oxycodone 10 mg 1 every 6 hours for breakthrough pain and takes this fairly infrequently. # pills per month: 60 Last UDS date: 1/21 Opioid Treatment Agreement signed (Y/N): yes Opioid Treatment Agreement last reviewed with patient:  Reviewed with no red flags NCCSRS reviewed this encounter (include red flags): Yes   Past Medical History:  Diagnosis Date  . Allergic rhinitis   . Anemia   . Anxiety   . Bronchiectasis   . Carpal tunnel syndrome 07/13/2015   Bilateral  . CVA (cerebral infarction) 10/2007   Right thalamic   . Diverticulosis of colon   . Gait disorder   . GERD (gastroesophageal reflux disease)   . History of breast cancer   . HTN (hypertension)   . Hyperlipidemia   . Idiopathic pulmonary fibrosis   . Left knee DJD   . Migraine   .  Osteoporosis   . Peripheral neuropathy   . PMR (polymyalgia rheumatica) (HCC)   . Polyneuropathy in other diseases classified elsewhere ([place]) 02/17/2013  . Previous back surgery 10/09/12  . Renal insufficiency   . Rheumatoid arthritis ([place])   . Seizure disorder ([place])   . Spondylosis, cervical, with myelopathy 08/31/2015   C3-4 myelopathy  . Temporal arteritis (HCC)    Right eye blind, on steroids per [facility]/ Dr [name]  . Type II or unspecified type diabetes mellitus without mention of complication, not stated as uncontrolled    2nd to steriods  . Vitamin D deficiency     Past Surgical History:  Procedure Laterality Date  . CARPAL TUNNEL RELEASE Right   . CATARACT EXTRACTION     OS - Summer 2010  . CERVICAL FUSION  2010   4 rods and pins in place  . CHOLECYSTECTOMY    . COLONOSCOPY  12/15/2011   Procedure: COLONOSCOPY;  Surgeon: [name], MD;  Location: [facility];  Service: Endoscopy;  Laterality: N/A;  . LUMBAR FUSION  04/2010   W/Mechanical fixation - [place]  2013   rods in hips (to stabilize).  Marland Kitchen MASTECTOMY    . NECK SURGERY    . rheumatoid nodule removal    . SPINAL FUSION  07/2010   T10-L2 interbody fusion / [facility]  . TONSILLECTOMY    . TOTAL KNEE ARTHROPLASTY     Left    Family History  Problem  Relation Age of Onset  . Brain cancer Mother   . Hypertension Mother   . Arthritis Mother   . Dementia Father   . Hypertension Father   . Arthritis Father   . Breast cancer Sister   . Lung cancer Sister   . Lung disease Neg Hx   . Rheumatologic disease Neg Hx     Social History   Socioeconomic History  . Marital status: Widowed    Spouse name: Not on file  . Number of children: 1  . Years of education: 12+ coll.  . Highest education level: Not on file  Occupational History  . Occupation: Education officer, environmental: RETIRED    Comment: retired  Tobacco Use  . Smoking status: Never  Smoker  . Smokeless tobacco: Never Used  . Tobacco comment: Father   Vaping Use  . Vaping Use: Never used  Substance and Sexual Activity  . Alcohol use: No    Alcohol/week: 0.0 standard drinks    Comment: heavy drinker until 1995 - sobriety with AA  . Drug use: No  . Sexual activity: Not Currently  Other Topics Concern  . Not on file  Social History Narrative      Married - husband invalid with stroke - widowed in 2009. 1 chil. 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 ACP - not formerly discussed - wishes to be a full code.    Patient is right handed. Patient consumes tea 4  times daily.         Patient drinks about 4-5 cups of caffeine daily.   Patient is right handed.       [facility] (04/25/17):   Originally from [place], [place]. Previously lived in [place]. 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 No bird or mold exposure. No hot tub exposure. Has traveled to [place], [place], and [place] No pets currently. Enjoys going to the beach.       Social Determinants of Health   Financial Resource Strain:   . Difficulty of Paying Living Expenses: Not on file  Food Insecurity:   . Worried About Charity fundraiser in the Last Year: Not on file  . Ran Out of Food in the Last Year: Not on file  Transportation Needs:   . Lack of Transportation (Medical): Not on file  . Lack of Transportation (Non-Medical): Not on file  Physical Activity:   . Days of Exercise per Week: Not on file  . Minutes of Exercise per Session: Not on file  Stress:   . Feeling of Stress : Not on file  Social Connections: Moderately Integrated  . Frequency of Communication with Friends and Family: More than three times a week  . Frequency of Social Gatherings with Friends and Family: Once a week  . Attends Religious Services: 1 to 4 times per year  . Active Member of Clubs or Organizations: Yes  . Attends Theatre manager Meetings: More than 4 times per year  . Marital Status: Widowed  Intimate Partner Violence:   . Fear of Current or Ex-Partner: Not on file  . Emotionally Abused: Not on file  . Physically Abused: Not on file  . Sexually Abused: Not on file    Outpatient Medications Prior to Visit  Medication Sig Dispense Refill  . amLODipine (NORVASC) 5 MG tablet TAKE 1 TABLET DAILY 90 tablet 3  . Calcium  Carb-Cholecalciferol (CALCIUM CARBONATE-VITAMIN D3 PO) Take 1 tablet by mouth daily    . cholecalciferol (VITAMIN D) 1000 UNITS tablet Take 1,000 Units by mouth daily.      Marland Kitchen denosumab (PROLIA) 60 MG/ML SOLN Inject 60 mg into the skin every 6 (six) months.      . diclofenac sodium (VOLTAREN) 1 % GEL APPLY 2 GRAMS TOPICALLY FOUR TIMES A DAY 400 g 6  . ferrous sulfate 325 (65 FE) MG tablet Take 325 mg by mouth 2 (two) times daily.     . furosemide (LASIX) 40 MG tablet TAKE 1 TABLET DAILY 90 tablet 3  . gabapentin (NEURONTIN) 300 MG capsule Take 2 capsules (600 mg total) by mouth 2 (two) times daily. 360 capsule 3  . Golimumab ([place] ARIA IV) Inject into the vein. Takes infusion every 8 wks.    . Multiple Vitamins-Minerals (MULTIVITAMIN,TX-MINERALS) tablet Take 1 tablet by mouth daily.      Marland Kitchen oxyCODONE (OXYCONTIN) 10 mg 12 hr tablet Take one tablet by mouth every 12 hours.  May refill in one month 60 tablet 0  . oxyCODONE (OXYCONTIN) 10 mg 12 hr tablet Take 1 tablet by mouth every 12 hours.  May refill in 2 months 60 tablet 0  . oxyCODONE (OXYCONTIN) 10 mg 12 hr tablet Take 1 tablet (10 mg total) by mouth every 12 (twelve) hours. 60 tablet 0  . Oxycodone HCl 10 MG TABS Take 1 tablet (10 mg total) by mouth every 6 (six) hours as needed. 90 tablet 0  . potassium chloride (KLOR-CON) 10 MEQ tablet TAKE 1 TABLET TWICE A DAY 180 tablet 3  . predniSONE (DELTASONE) 1 MG tablet TAKE 4 TABLETS DAILY WITH BREAKFAST 360 tablet 3  . simvastatin (ZOCOR) 5 MG tablet TAKE 1 TABLET AT BEDTIME 90 tablet 3  .  tizanidine (ZANAFLEX) 2 MG capsule Take 1 capsule (2 mg total) by mouth as needed for muscle spasms (take 1 at bedtime as needed for muscle spasms). 30 capsule 3  . MULTIPLE VITAMINS-MINERALS PO Take by mouth.     No facility-administered medications prior to visit.    Allergies  Allergen Reactions  . Hydromorphone Other (See Comments)    Cognitive changes  "made me unconscious"    ROS Review of Systems  Constitutional: Negative for chills, fatigue and fever.  Respiratory: Negative for cough and shortness of breath.   Cardiovascular: Negative for chest pain and leg swelling.  Genitourinary: Negative for dysuria.  Musculoskeletal: Positive for back pain.      Objective:    Physical Exam Vitals reviewed.  Constitutional:      Appearance: Normal appearance.  Cardiovascular:     Rate and Rhythm: Normal rate and regular rhythm.  Pulmonary:     Effort: Pulmonary effort is normal.     Breath sounds: Normal breath sounds.  Musculoskeletal:     Right lower leg: No edema.     Left lower leg: No edema.  Neurological:     Mental Status: She is alert.     BP 104/60 (BP Location: Right Arm, Patient Position: Sitting, Cuff Size: Normal)   Pulse 82   Temp 98.1 F (36.7 C) (Oral)   Ht 5\' 2"  (1.575 m)   Wt 144 lb 1.6 oz (65.4 kg)   SpO2 96%   BMI 26.36 kg/m  Wt Readings from Last 3 Encounters:  07/13/20 144 lb 1.6 oz (65.4 kg)  04/27/20 142 lb 4.8 oz (64.5 kg)  04/12/20 142 lb 3.2 oz (64.5 kg)  Health Maintenance Due  Topic Date Due  . OPHTHALMOLOGY EXAM  Never done  . FOOT EXAM  04/26/2018  . HEMOGLOBIN A1C  04/29/2018    There are no preventive care reminders to display for this patient.  Lab Results  Component Value Date   TSH 2.13 01/08/2020   Lab Results  Component Value Date   WBC 8.1 03/16/2020   HGB 13.6 03/16/2020   HCT 39.2 03/16/2020   MCV 94.9 03/16/2020   PLT 241.0 03/16/2020   Lab Results  Component Value Date   NA 140 03/16/2020   K  4.1 03/16/2020   CO2 31 03/16/2020   GLUCOSE 98 03/16/2020   BUN 31 (H) 03/16/2020   CREATININE 1.32 (H) 03/16/2020   BILITOT 0.8 04/25/2017   ALKPHOS 82 06/25/2018   AST 26 06/25/2018   ALT 20 06/25/2018   PROT 7.1 04/25/2017   ALBUMIN 4.0 04/25/2017   CALCIUM 9.8 03/16/2020   GFR 38.74 (L) 03/16/2020   Lab Results  Component Value Date   CHOL 147 04/03/2019   Lab Results  Component Value Date   HDL 72.40 04/03/2019   Lab Results  Component Value Date   LDLCALC 60 04/03/2019   Lab Results  Component Value Date   TRIG 74.0 04/03/2019   Lab Results  Component Value Date   CHOLHDL 2 04/03/2019   Lab Results  Component Value Date   HGBA1C 5.3 10/30/2017      Assessment & Plan:   #1 chronic neck and low back pain with multiple prior surgeries.  Patient on chronic pain management with OxyContin 10 mg every 12 hours and takes oxycodone for breakthrough pain.  Needs refills of both medications at this time.  #2 exostosis left foot with overlying callus and associated ulcer.  Upcoming debridement surgery.  Forms were completed for surgical approval.  No known contraindications.  #3 hypertension stable -Continue amlodipine  #4 history of chronic [name] disease followed by nephrology  #5 history of osteoporosis-on Prolia injections every 6 months  #6 rheumatoid arthritis-treated with Simponi injection every 8 weeks  #7 health maintenance-flu vaccine given    No orders of the defined types were placed in this encounter.   Follow-up: Return in about 3 months (around 10/13/2020).    Carolann Littler, MD

## 2020-07-13 NOTE — Patient Instructions (Signed)
Community Occupational psychologist of Services Cost  A Matter of Balance Class locations vary. Call [place] on Aging for more information.  http://dawson-[email or web address]/ [phone] 8-Session program addressing the fear of falling and increasing activity levels of older adults Free to minimal cost  A.C.T. By [employer] [address], [place], [place] [address].  BetaBlues.dk [phone]  Personal training, gym, classes including Silver Sneakers* and ACTion for Aging Adults Fee-based  A.H.O.Y. (Add Health to [place]) Airs on Time [employer] 13, M-F at [place]: [employer],  [place] [facility] [place] [place],  [place], [facility], [address] [facility], [place], [place], [place] [address]  [place] Location: Sharrell Ku. [employer] [place]      [phone]  [phone]  [phone]  [phone]  [phone]  [phone]  [phone]  [phone]  [phone]    [phone] A total-body conditioning class for adults 77 and older; designed to increase muscular strength, endurance, range of movement, flexibility, balance, agility and coordination Free  [facility] [place], [place] [address] [place]      [address]      [phone]      Pilate's class for individualsreturning to exercise after an injury, before or after surgery or for individuals with complex musculoskeletal issues; designed to improve strength, balance , flexibility      $15/class  [place] [address] [place], [place] [address] [email or web address] [place] classes for beginners to advanced [place], [place] [address] [email or web address] [place] Chair Exercises Free, ages 77 and older; Ages 43-59 fee based  Marvia Pickles, [facility] [address] [place], [place] [address] Seniorcenter_1 .Beverlee Nims [phone]  A.H.O.Y. Tai Chi Fee-based Donation based or free  [place] Class locations vary.  Call or email Angela Burke or view website for more information. Info_[email or web address] .com [email or web address].com.[email or web address] [phone] Ongoing classes at local YMCAs and gyms Fee-based  Silver Sneakers A.C.T. By [place] [employer]: [facility] [facility] [place] [phone]  [phone] Classes designed for older adults who want to improve their strength, flexibility, balance and endurance.   Silver sneakers is covered by some insurance plans and includes a fitness center membership at participating locations. Find out more by calling [phone] or visiting [email or web address] Covered by some insurance plans  [facility] [place] [phone] A.H.O.Y., fitness room, personal training, fitness classes for injury prevention, strength, balance, flexibility, water fitness classes Ages 55+: $36 for 6 months; Ages 80-54: $13 for 6 months  Tai Chi for Everybody [facility] [address] [place], [place] [address] Taichiforeverybody_3 .Patsi Sears [phone] Tai Chi classes for beginners to advanced; geared for seniors Donation Based  [employer] (Helpling Others Participate in Exercise     Loyal Gambler. Rosana Hoes, PhD, [place] [email or web address] [place]     [phone]     A comprehensive fitness program for adults.  The program paris senior-level undergraduates Kinesiology students with adults who desire to learn how to exercise safely.  Includes a structural exercise class focusing on functional fitnesss     $100/semester in fall and spring; $75 in summer (no trainers)    *Silver Sneakers is covered by some Personal assistant and includes a  Radio producer at participating locations.  Find out more by calling [phone] or visiting [email or web address]  For additional health and human services resources for senior adults, please contact SeniorLine at [phone] in [place] and [place] at [phone] in all other areas.Fall Prevention in the Home, Adult Falls can cause injuries and can affect people from all age groups. There are many simple things that you can do to make your home safe and to help prevent falls. Ask for help when making these changes, if needed. What actions can I take to prevent falls? General instructions  Use good lighting in all rooms. Replace any light bulbs that burn out.  Turn on lights if it is dark. Use night-lights.  Place frequently used items in easy-to-reach places. Lower the shelves around your home if necessary.  Set up furniture so that there are clear paths around it. Avoid moving your furniture around.  Remove throw rugs and other tripping hazards from the floor.  Avoid walking on wet floors.  Fix any uneven floor surfaces.  Add color or contrast paint or tape to grab bars and handrails in your home. Place contrasting color strips on the first and last steps of  stairways.  When you use a stepladder, make sure that it is completely opened and that the sides are firmly locked. Have someone hold the ladder while you are using it. Do not climb a closed stepladder.  Be aware of any and all pets. What can I do in the bathroom?      Keep the floor dry. Immediately clean up any water that spills onto the floor.  Remove soap buildup in the tub or shower on a regular basis.  Use non-skid mats or decals on the floor of the tub or shower.  Attach bath mats securely with double-sided, non-slip rug tape.  If you need to sit down while you are in the shower, use a plastic, non-slip stool.  Install grab bars by the toilet and in the tub and shower. Do not use towel bars as grab bars. What can I do in the bedroom?  Make sure that a bedside light is easy to reach.  Do not use oversized bedding that drapes onto the floor.  Have a firm chair that has side arms to use for getting dressed. What can I do in the kitchen?  Clean up any spills right away.  If you need to reach for something above you, use a sturdy step stool that has a grab bar.  Keep electrical cables out of the way.  Do not use floor polish or wax that makes floors slippery. If you must use wax, make sure that it is non-skid floor wax. What can I do in the stairways?  Do not leave any items on the stairs.  Make sure that you have a light switch at the top of the stairs and the bottom of the stairs. Have them installed if you do not have them.  Make sure that there are handrails on both sides of the stairs. Fix handrails that are broken or loose. Make sure that handrails are as long as the stairways.  Install non-slip stair treads on all stairs in your home.  Avoid having throw rugs at the top or bottom of stairways, or secure the rugs with carpet tape to prevent them from moving.  Choose a carpet design that does not hide the edge of steps on the stairway.  Check any carpeting to  make sure that it is firmly attached to the stairs. Fix any carpet that is loose or worn. What can I do on the outside of my home?  Use bright outdoor lighting.  Regularly repair the edges of walkways and driveways and fix any cracks.  Remove high doorway thresholds.  Trim any shrubbery on the main path into your home.  Regularly check that handrails are securely fastened and in good repair. Both sides of any steps should have handrails.  Install guardrails along the edges of any raised decks or porches.  Clear walkways of debris and clutter, including tools and rocks.  Have leaves, snow, and ice cleared regularly.  Use sand or salt on walkways during winter months.  In the garage, clean up any spills right away, including grease or oil spills. What other actions can I take?  Wear closed-toe shoes that fit well and support your feet. Wear shoes that have rubber soles or low heels.  Use mobility aids as needed, such as canes, walkers, scooters, and crutches.  Review your medicines with your health care provider. Some medicines can cause dizziness or changes in blood pressure, which increase your risk of falling. Talk with your health care provider about other ways that you can decrease your risk of falls. This may include working with a physical therapist or trainer to improve your strength, balance, and endurance. Where to find more information  Centers for Disease Control and Prevention, STEADI: WebmailGuide.co.za  [employer] on Aging: BrainJudge.co.uk Contact a health care provider if:  You are afraid of falling at home.  You feel weak, drowsy, or dizzy at home.  You fall at home. Summary  There are many simple things that you can do to make your home safe and to help prevent falls.  Ways to make your home safe include removing tripping hazards and installing grab bars in the bathroom.  Ask for help when making these changes in your home. This  information is not intended to replace advice given to you by your health care provider. Make sure you discuss any questions you have with your health care provider. Document Revised: 08/23/2017 Document Reviewed: 04/25/2017 Elsevier Patient Education  2020 [employer].

## 2020-07-19 DIAGNOSIS — M810 Age-related osteoporosis without current pathological fracture: Secondary | ICD-10-CM | POA: Diagnosis not present

## 2020-08-09 ENCOUNTER — Encounter: Payer: Self-pay | Admitting: Dermatology

## 2020-08-09 ENCOUNTER — Ambulatory Visit (INDEPENDENT_AMBULATORY_CARE_PROVIDER_SITE_OTHER): Payer: Medicare Other | Admitting: Dermatology

## 2020-08-09 ENCOUNTER — Other Ambulatory Visit: Payer: Self-pay

## 2020-08-09 DIAGNOSIS — L821 Other seborrheic keratosis: Secondary | ICD-10-CM | POA: Diagnosis not present

## 2020-08-09 DIAGNOSIS — R202 Paresthesia of skin: Secondary | ICD-10-CM | POA: Diagnosis not present

## 2020-08-09 DIAGNOSIS — Z1283 Encounter for screening for malignant neoplasm of skin: Secondary | ICD-10-CM

## 2020-08-12 ENCOUNTER — Encounter: Payer: Self-pay | Admitting: Dermatology

## 2020-08-12 NOTE — Progress Notes (Signed)
   Follow-Up Visit   Subjective  [name] is a 79 y.o. female who presents for the following: Skin Problem (crusty spots on back- + itch ).  General skin examination Location: Growths and itching on back Duration:  Quality:  Associated Signs/Symptoms: Modifying Factors:  Severity:  Timing: Context:   Objective  Well appearing patient in no apparent distress; mood and affect are within normal limits.  All sun exposed areas plus back examined.   Assessment & Plan    Screening exam for skin cancer Chest - Medial [facility])  Annual skin examination  Seborrheic keratosis Left Upper Back  Leave if stable  Notalgia paresthetica Right Lower Back  May try in any pramoxine containing over-the-counter antiitch cream.     I, Lavonna Monarch, MD, have reviewed all documentation for this visit.  The documentation on 08/12/20 for the exam, diagnosis, procedures, and orders are all accurate and complete.

## 2020-08-16 DIAGNOSIS — M79604 Pain in right leg: Secondary | ICD-10-CM | POA: Diagnosis not present

## 2020-08-16 DIAGNOSIS — M545 Low back pain, unspecified: Secondary | ICD-10-CM | POA: Diagnosis not present

## 2020-08-16 DIAGNOSIS — M5431 Sciatica, right side: Secondary | ICD-10-CM | POA: Diagnosis not present

## 2020-08-22 ENCOUNTER — Ambulatory Visit (INDEPENDENT_AMBULATORY_CARE_PROVIDER_SITE_OTHER): Payer: Medicare Other | Admitting: Family Medicine

## 2020-08-22 ENCOUNTER — Other Ambulatory Visit: Payer: Self-pay

## 2020-08-22 ENCOUNTER — Encounter: Payer: Self-pay | Admitting: Family Medicine

## 2020-08-22 VITALS — BP 120/70 | HR 81 | Temp 98.1°F | Ht 62.0 in | Wt 146.4 lb

## 2020-08-22 DIAGNOSIS — M5431 Sciatica, right side: Secondary | ICD-10-CM | POA: Diagnosis not present

## 2020-08-22 DIAGNOSIS — M79672 Pain in left foot: Secondary | ICD-10-CM | POA: Diagnosis not present

## 2020-08-22 NOTE — Progress Notes (Signed)
Established Patient Office Visit  Subjective:  Patient ID: [name], female    DOB: 1940/12/20  Age: 79 y.o. MRN: [id number]  CC:  Chief Complaint  Patient presents with  . Pain    right hip and right side of buttocks pain started last week. Left foot pain also     HPI [name] presents for right buttock, right hip, and right thigh pain which started on 19 November.  She was getting ready to go out west to [place] to visit family for Thanksgiving.  She had just loaded her suitcase and was lifting this off the bed to put on the floor when she first noticed some pain in her right buttock radiating toward the hamstring.  She went ahead with her trip.  When she arrived in [place] the next day she had difficulty walking secondary to pain.  No progressive numbness.  She had couple episodes where she felt like she had some transient weakness right lower extremity.  She ended up going to an urgent care either on the 23rd or 24 November.  Her initial concern is whether this could have been a blood clot.  She was reassured by her exam there is no evidence for DVT.  No x-rays were done.  She was prescribed lidocaine patch.  Her pain is improving at this time.  She still has some pain in the right buttock but overall improved.  No urine or stool incontinence.  She has had some progressive left foot pains.  We had noted months ago very thickened callus and she is currently seeing podiatrist over [facility] and they are trying to schedule her surgery.  She apparently has ulcer underneath the callus.  Fortunately, she is not a diabetic.  No peripheral vascular disease.  Non-smoker.  Past Medical History:  Diagnosis Date  . Allergic rhinitis   . Anemia   . Anxiety   . Bronchiectasis   . Carpal tunnel syndrome 07/13/2015   Bilateral  . CVA (cerebral infarction) 10/2007   Right thalamic   . Diverticulosis of colon   . Gait disorder   . GERD (gastroesophageal reflux disease)   .  History of breast cancer   . HTN (hypertension)   . Hyperlipidemia   . Idiopathic pulmonary fibrosis   . Left knee DJD   . Migraine   . Osteoporosis   . Peripheral neuropathy   . PMR (polymyalgia rheumatica) (HCC)   . Polyneuropathy in other diseases classified elsewhere ([place]) 02/17/2013  . Previous back surgery 10/09/12  . Renal insufficiency   . Rheumatoid arthritis ([place])   . Seizure disorder ([place])   . Spondylosis, cervical, with myelopathy 08/31/2015   C3-4 myelopathy  . Temporal arteritis (HCC)    Right eye blind, on steroids per [facility]/ Dr [name]  . Type II or unspecified type diabetes mellitus without mention of complication, not stated as uncontrolled    2nd to steriods  . Vitamin D deficiency     Past Surgical History:  Procedure Laterality Date  . CARPAL TUNNEL RELEASE Right   . CATARACT EXTRACTION     OS - Summer 2010  . CERVICAL FUSION  2010   4 rods and pins in place  . CHOLECYSTECTOMY    . COLONOSCOPY  12/15/2011   Procedure: COLONOSCOPY;  Surgeon: [name], MD;  Location: [facility];  Service: Endoscopy;  Laterality: N/A;  . LUMBAR FUSION  04/2010   W/Mechanical fixation - [place]  2013  rods in hips (to stabilize).  Marland Kitchen MASTECTOMY    . NECK SURGERY    . rheumatoid nodule removal    . SPINAL FUSION  07/2010   T10-L2 interbody fusion / [facility]  . TONSILLECTOMY    . TOTAL KNEE ARTHROPLASTY     Left    Family History  Problem Relation Age of Onset  . Brain cancer Mother   . Hypertension Mother   . Arthritis Mother   . Dementia Father   . Hypertension Father   . Arthritis Father   . Breast cancer Sister   . Lung cancer Sister   . Lung disease Neg Hx   . Rheumatologic disease Neg Hx     Social History   Socioeconomic History  . Marital status: Widowed    Spouse name: Not on file  . Number of children: 1  . Years of education: 12+ coll.  . Highest education level: Not on file  Occupational History  .  Occupation: Education officer, environmental: RETIRED    Comment: retired  Tobacco Use  . Smoking status: Never Smoker  . Smokeless tobacco: Never Used  . Tobacco comment: Father   Vaping Use  . Vaping Use: Never used  Substance and Sexual Activity  . Alcohol use: No    Alcohol/week: 0.0 standard drinks    Comment: heavy drinker until 1995 - sobriety with AA  . Drug use: No  . Sexual activity: Not Currently  Other Topics Concern  . Not on file  Social History Narrative      Married - husband invalid with stroke - widowed in 2009. 1 chil. 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 ACP - not formerly discussed - wishes to be a full code.    Patient is right handed. Patient consumes tea 4  times daily.         Patient drinks about 4-5 cups of caffeine daily.   Patient is right handed.       [facility] (04/25/17):   Originally from [place], [place]. Previously lived in [place]. 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 No bird or mold exposure. No hot tub exposure. Has traveled to [place], [place], and [place] No pets currently. Enjoys going to the beach.       Social Determinants of Health   Financial Resource Strain:   . Difficulty of Paying Living Expenses: Not on file  Food Insecurity:   . Worried About Charity fundraiser in the Last Year: Not on file  . Ran Out of Food in the Last Year: Not on file  Transportation Needs:   . Lack of Transportation (Medical): Not on file  . Lack of Transportation (Non-Medical): Not on file  Physical Activity:   . Days of Exercise per Week: Not on file  . Minutes of Exercise per Session: Not on file  Stress:   . Feeling of Stress : Not on file  Social Connections: Moderately Integrated  . Frequency of Communication with Friends and Family: More than three times a week  . Frequency of Social Gatherings with Friends and  Family: Once a week  . Attends Religious Services: 1 to 4 times per year  . Active Member of Clubs or Organizations: Yes  . Attends Archivist Meetings: More than 4 times per year  . Marital Status: Widowed  Intimate Partner Violence:   .  Fear of Current or Ex-Partner: Not on file  . Emotionally Abused: Not on file  . Physically Abused: Not on file  . Sexually Abused: Not on file    Outpatient Medications Prior to Visit  Medication Sig Dispense Refill  . amLODipine (NORVASC) 5 MG tablet TAKE 1 TABLET DAILY 90 tablet 3  . Calcium Carb-Cholecalciferol (CALCIUM CARBONATE-VITAMIN D3 PO) Take 1 tablet by mouth daily    . cholecalciferol (VITAMIN D) 1000 UNITS tablet Take 1,000 Units by mouth daily.      Marland Kitchen denosumab (PROLIA) 60 MG/ML SOLN Inject 60 mg into the skin every 6 (six) months.      . diclofenac sodium (VOLTAREN) 1 % GEL APPLY 2 GRAMS TOPICALLY FOUR TIMES A DAY 400 g 6  . ferrous sulfate 325 (65 FE) MG tablet Take 325 mg by mouth 2 (two) times daily.     . furosemide (LASIX) 40 MG tablet TAKE 1 TABLET DAILY 90 tablet 3  . gabapentin (NEURONTIN) 300 MG capsule Take 2 capsules (600 mg total) by mouth 2 (two) times daily. 360 capsule 3  . Golimumab ([place] ARIA IV) Inject into the vein. Takes infusion every 8 wks.    . Multiple Vitamins-Minerals (MULTIVITAMIN,TX-MINERALS) tablet Take 1 tablet by mouth daily.      Marland Kitchen oxyCODONE (OXYCONTIN) 10 mg 12 hr tablet Take one tablet by mouth every 12 hours.  May refill in one month 60 tablet 0  . oxyCODONE (OXYCONTIN) 10 mg 12 hr tablet Take 1 tablet (10 mg total) by mouth every 12 (twelve) hours. 60 tablet 0  . oxyCODONE (OXYCONTIN) 10 mg 12 hr tablet Take 1 tablet by mouth every 12 hours.  May refill in 2 months 60 tablet 0  . Oxycodone HCl 10 MG TABS Take 1 tablet (10 mg total) by mouth every 6 (six) hours as needed. Take 1 tablet by mouth every 6 hours as needed for breakthrough pain 90 tablet 0  . potassium chloride (KLOR-CON) 10 MEQ  tablet TAKE 1 TABLET TWICE A DAY 180 tablet 3  . predniSONE (DELTASONE) 1 MG tablet TAKE 4 TABLETS DAILY WITH BREAKFAST 360 tablet 3  . simvastatin (ZOCOR) 5 MG tablet TAKE 1 TABLET AT BEDTIME 90 tablet 3  . tizanidine (ZANAFLEX) 2 MG capsule Take 1 capsule (2 mg total) by mouth as needed for muscle spasms (take 1 at bedtime as needed for muscle spasms). 30 capsule 3  . lidocaine (LIDODERM) 5 % 1 patch daily. (Patient not taking: Reported on 08/22/2020)     No facility-administered medications prior to visit.    Allergies  Allergen Reactions  . Hydromorphone Other (See Comments)    Cognitive changes  "made me unconscious"    ROS Review of Systems  Constitutional: Negative for chills and fever.  Respiratory: Negative for cough and shortness of breath.   Cardiovascular: Negative for chest pain.  Musculoskeletal: Positive for back pain.  Neurological: Negative for weakness.      Objective:    Physical Exam Vitals reviewed.  Constitutional:      Appearance: Normal appearance.  Cardiovascular:     Rate and Rhythm: Normal rate and regular rhythm.  Pulmonary:     Effort: Pulmonary effort is normal.     Breath sounds: Normal breath sounds.  Musculoskeletal:     Comments: No pitting edema.  Her left leg is actually slightly larger than her right but is been so chronically for some time.  There is no calf tenderness.  No thigh tenderness.  Straight  leg raise are negative.  Neurological:     Mental Status: She is alert.     Comments: Symmetric reflexes ankle and knee bilaterally.  Full strength with plantarflexion, dorsiflexion, and knee extension     BP 120/70   Pulse 81   Temp 98.1 F (36.7 C) (Oral)   Ht 5\' 2"  (1.575 m)   Wt 146 lb 6.4 oz (66.4 kg)   SpO2 96%   BMI 26.78 kg/m  Wt Readings from Last 3 Encounters:  08/22/20 146 lb 6.4 oz (66.4 kg)  07/13/20 144 lb 1.6 oz (65.4 kg)  04/27/20 142 lb 4.8 oz (64.5 kg)     Health Maintenance Due  Topic Date Due  .  OPHTHALMOLOGY EXAM  Never done  . FOOT EXAM  04/26/2018  . HEMOGLOBIN A1C  04/29/2018    There are no preventive care reminders to display for this patient.  Lab Results  Component Value Date   TSH 2.13 01/08/2020   Lab Results  Component Value Date   WBC 8.1 03/16/2020   HGB 13.6 03/16/2020   HCT 39.2 03/16/2020   MCV 94.9 03/16/2020   PLT 241.0 03/16/2020   Lab Results  Component Value Date   NA 140 03/16/2020   K 4.1 03/16/2020   CO2 31 03/16/2020   GLUCOSE 98 03/16/2020   BUN 31 (H) 03/16/2020   CREATININE 1.32 (H) 03/16/2020   BILITOT 0.8 04/25/2017   ALKPHOS 82 06/25/2018   AST 26 06/25/2018   ALT 20 06/25/2018   PROT 7.1 04/25/2017   ALBUMIN 4.0 04/25/2017   CALCIUM 9.8 03/16/2020   GFR 38.74 (L) 03/16/2020   Lab Results  Component Value Date   CHOL 147 04/03/2019   Lab Results  Component Value Date   HDL 72.40 04/03/2019   Lab Results  Component Value Date   LDLCALC 60 04/03/2019   Lab Results  Component Value Date   TRIG 74.0 04/03/2019   Lab Results  Component Value Date   CHOLHDL 2 04/03/2019   Lab Results  Component Value Date   HGBA1C 5.3 10/30/2017      Assessment & Plan:   #1 right lower extremity pain buttock to hamstring region.  Suspect sciatica symptoms.  Clinically, low suspicion for DVT.  Symptoms are improving at this time gradually  -Continue observation.  Watch for any progressive weakness or numbness. -Avoid heavy lifting  #2 left foot pain secondary to thick callus and ulceration -Patient has pending surgery with podiatrist over [facility]  No orders of the defined types were placed in this encounter.   Follow-up: No follow-ups on file.    Carolann Littler, MD

## 2020-08-22 NOTE — Patient Instructions (Signed)

## 2020-08-23 DIAGNOSIS — M898X7 Other specified disorders of bone, ankle and foot: Secondary | ICD-10-CM | POA: Diagnosis not present

## 2020-08-23 DIAGNOSIS — Q828 Other specified congenital malformations of skin: Secondary | ICD-10-CM | POA: Diagnosis not present

## 2020-08-31 DIAGNOSIS — Z79899 Other long term (current) drug therapy: Secondary | ICD-10-CM | POA: Diagnosis not present

## 2020-08-31 DIAGNOSIS — M0589 Other rheumatoid arthritis with rheumatoid factor of multiple sites: Secondary | ICD-10-CM | POA: Diagnosis not present

## 2020-09-01 DIAGNOSIS — H4720 Unspecified optic atrophy: Secondary | ICD-10-CM | POA: Diagnosis not present

## 2020-09-01 DIAGNOSIS — H04123 Dry eye syndrome of bilateral lacrimal glands: Secondary | ICD-10-CM | POA: Diagnosis not present

## 2020-09-01 DIAGNOSIS — H52202 Unspecified astigmatism, left eye: Secondary | ICD-10-CM | POA: Diagnosis not present

## 2020-09-01 DIAGNOSIS — H43813 Vitreous degeneration, bilateral: Secondary | ICD-10-CM | POA: Diagnosis not present

## 2020-09-09 ENCOUNTER — Other Ambulatory Visit: Payer: Self-pay | Admitting: Neurology

## 2020-09-14 ENCOUNTER — Other Ambulatory Visit: Payer: Self-pay

## 2020-09-14 ENCOUNTER — Encounter (HOSPITAL_BASED_OUTPATIENT_CLINIC_OR_DEPARTMENT_OTHER): Payer: Self-pay | Admitting: *Deleted

## 2020-09-14 ENCOUNTER — Emergency Department (HOSPITAL_BASED_OUTPATIENT_CLINIC_OR_DEPARTMENT_OTHER)
Admission: EM | Admit: 2020-09-14 | Discharge: 2020-09-14 | Disposition: A | Discharge status: Home / Self Care | Payer: Medicare Other | Attending: Emergency Medicine | Admitting: Emergency Medicine

## 2020-09-14 ENCOUNTER — Emergency Department (HOSPITAL_BASED_OUTPATIENT_CLINIC_OR_DEPARTMENT_OTHER): Payer: Medicare Other

## 2020-09-14 DIAGNOSIS — M79604 Pain in right leg: Secondary | ICD-10-CM | POA: Diagnosis not present

## 2020-09-14 DIAGNOSIS — Z79899 Other long term (current) drug therapy: Secondary | ICD-10-CM | POA: Insufficient documentation

## 2020-09-14 DIAGNOSIS — R600 Localized edema: Secondary | ICD-10-CM

## 2020-09-14 DIAGNOSIS — M79605 Pain in left leg: Secondary | ICD-10-CM | POA: Diagnosis not present

## 2020-09-14 DIAGNOSIS — E119 Type 2 diabetes mellitus without complications: Secondary | ICD-10-CM | POA: Diagnosis not present

## 2020-09-14 DIAGNOSIS — R238 Other skin changes: Secondary | ICD-10-CM | POA: Diagnosis not present

## 2020-09-14 DIAGNOSIS — Z853 Personal history of malignant neoplasm of breast: Secondary | ICD-10-CM | POA: Insufficient documentation

## 2020-09-14 DIAGNOSIS — M25551 Pain in right hip: Secondary | ICD-10-CM | POA: Diagnosis not present

## 2020-09-14 DIAGNOSIS — R2243 Localized swelling, mass and lump, lower limb, bilateral: Secondary | ICD-10-CM | POA: Diagnosis present

## 2020-09-14 DIAGNOSIS — I10 Essential (primary) hypertension: Secondary | ICD-10-CM | POA: Diagnosis not present

## 2020-09-14 DIAGNOSIS — M7989 Other specified soft tissue disorders: Secondary | ICD-10-CM | POA: Diagnosis not present

## 2020-09-14 DIAGNOSIS — Z96652 Presence of left artificial knee joint: Secondary | ICD-10-CM | POA: Insufficient documentation

## 2020-09-14 LAB — CBC WITH DIFFERENTIAL/PLATELET
Abs Immature Granulocytes: 0.02 K/uL (ref 0.00–0.07)
Basophils Absolute: 0.1 K/uL (ref 0.0–0.1)
Basophils Relative: 1 %
Eosinophils Absolute: 0.2 K/uL (ref 0.0–0.5)
Eosinophils Relative: 3 %
HCT: 40.9 % (ref 36.0–46.0)
Hemoglobin: 13.5 g/dL (ref 12.0–15.0)
Immature Granulocytes: 0 %
Lymphocytes Relative: 19 %
Lymphs Abs: 1.4 K/uL (ref 0.7–4.0)
MCH: 32.6 pg (ref 26.0–34.0)
MCHC: 33 g/dL (ref 30.0–36.0)
MCV: 98.8 fL (ref 80.0–100.0)
Monocytes Absolute: 0.6 K/uL (ref 0.1–1.0)
Monocytes Relative: 7 %
Neutro Abs: 5.3 K/uL (ref 1.7–7.7)
Neutrophils Relative %: 70 %
Platelets: 232 K/uL (ref 150–400)
RBC: 4.14 MIL/uL (ref 3.87–5.11)
RDW: 12.6 % (ref 11.5–15.5)
WBC: 7.6 K/uL (ref 4.0–10.5)
nRBC: 0 % (ref 0.0–0.2)

## 2020-09-14 LAB — BASIC METABOLIC PANEL
Anion gap: 10 (ref 5–15)
BUN: 18 mg/dL (ref 8–23)
CO2: 27 mmol/L (ref 22–32)
Calcium: 8.5 mg/dL — ABNORMAL LOW (ref 8.9–10.3)
Chloride: 102 mmol/L (ref 98–111)
Creatinine, Ser: 1.18 mg/dL — ABNORMAL HIGH (ref 0.44–1.00)
GFR, Estimated: 47 mL/min — ABNORMAL LOW (ref >60)
Glucose, Bld: 88 mg/dL (ref 70–99)
Potassium: 3.4 mmol/L — ABNORMAL LOW (ref 3.5–5.1)
Sodium: 139 mmol/L (ref 135–145)

## 2020-09-14 MED ORDER — POTASSIUM CHLORIDE CRYS ER 20 MEQ PO TBCR
40.0000 meq | EXTENDED_RELEASE_TABLET | Freq: Once | ORAL | Status: AC
  Administered 2020-09-14: 40 meq via ORAL
  Filled 2020-09-14: qty 2

## 2020-09-14 NOTE — ED Provider Notes (Signed)
[facility] EMERGENCY DEPARTMENT Provider Note   CSN: [id number] Arrival date & time: 09/14/20  1335     History Chief Complaint  Patient presents with  . Leg Swelling    [name] is a 79 y.o. female.  Patient presents with bilateral leg swelling and left leg discomfort ongoing for about 5 to 6 days.  She states that she went shopping a lot lately on her feet a lot more often than normal and noticed worsening swelling and pain.  She spoke with her physician who sent her to the ER to rule out DVT.  She denies chest pain or shortness of breath no fall or trauma no fevers or cough or chills.        Past Medical History:  Diagnosis Date  . Allergic rhinitis   . Anemia   . Anxiety   . Bronchiectasis   . Carpal tunnel syndrome 07/13/2015   Bilateral  . CVA (cerebral infarction) 10/2007   Right thalamic   . Diverticulosis of colon   . Gait disorder   . GERD (gastroesophageal reflux disease)   . History of breast cancer   . HTN (hypertension)   . Hyperlipidemia   . Idiopathic pulmonary fibrosis   . Left knee DJD   . Migraine   . Osteoporosis   . Peripheral neuropathy   . PMR (polymyalgia rheumatica) (HCC)   . Polyneuropathy in other diseases classified elsewhere ([place]) 02/17/2013  . Previous back surgery 10/09/12  . Renal insufficiency   . Rheumatoid arthritis ([place])   . Seizure disorder ([place])   . Spondylosis, cervical, with myelopathy 08/31/2015   C3-4 myelopathy  . Temporal arteritis (HCC)    Right eye blind, on steroids per [facility]er[facility]/ Dr [name]  . Type II or unspecified type diabetes mellitus without mention of complication, not stated as uncontrolled    2nd to steriods  . Vitamin D deficiency     Patient Active Problem List   Diagnosis Date Noted  . Pain management 10/30/2017  . Rheumatoid arthritis ([place]) 04/25/2017  . Spondylosis, cervical, with myelopathy 08/31/2015  . Carpal tunnel syndrome 07/13/2015  . Obesity (BMI 30-39.9) 04/06/2014  .  Shortness of breath 07/14/2013  . Bone cyst 07/14/2013  . Polyneuropathy in other diseases classified elsewhere ([place]) 02/17/2013  . Chronic back pain 12/09/2012  . Peripheral edema 09/25/2012  . Anemia due to chronic illness 09/01/2012  . Cervicalgia 07/11/2012  . Disturbance of skin sensation 07/11/2012  . Temporal arteritis ([place]) 04/01/2012  . Lower GI bleed 12/24/2011  . Rectal bleed 12/13/2011  . Chronic renal insufficiency, stage II (mild) 04/04/2011  . LUMBAR RADICULOPATHY, LEFT 04/18/2009  . Essential hypertension 12/30/2007  . SEIZURE DISORDER 12/30/2007  . CEREBROVASCULAR ACCIDENT, HX OF 12/30/2007  . Hyperlipidemia 10/08/2007  . GERD 10/08/2007  . DIVERTICULOSIS, COLON 10/08/2007  . BRONCHIECTASIS 06/26/2007  . PULMONARY FIBROSIS 05/17/2007  . Osteoporosis 05/17/2007  . BREAST CANCER, HX OF 05/17/2007  . MASTECTOMY, LEFT, HX OF 05/17/2007    Past Surgical History:  Procedure Laterality Date  . CARPAL TUNNEL RELEASE Right   . CATARACT EXTRACTION     OS - Summer 2010  . CERVICAL FUSION  2010   4 rods and pins in place  . CHOLECYSTECTOMY    . COLONOSCOPY  12/15/2011   Procedure: COLONOSCOPY;  Surgeon: [name], MD;  Location: [facility];  Service: Endoscopy;  Laterality: N/A;  . LUMBAR FUSION  04/2010   W/Mechanical fixation - [place]  FUSION  2013   rods in hips (to stabilize).  Marland Kitchen MASTECTOMY    . NECK SURGERY    . rheumatoid nodule removal    . SPINAL FUSION  07/2010   T10-L2 interbody fusion / [facility]  . TONSILLECTOMY    . TOTAL KNEE ARTHROPLASTY     Left     OB History   No obstetric history on file.     Family History  Problem Relation Age of Onset  . Brain cancer Mother   . Hypertension Mother   . Arthritis Mother   . Dementia Father   . Hypertension Father   . Arthritis Father   . Breast cancer Sister   . Lung cancer Sister   . Lung disease Neg Hx   . Rheumatologic disease Neg Hx     Social History    Tobacco Use  . Smoking status: Never Smoker  . Smokeless tobacco: Never Used  . Tobacco comment: Father   Vaping Use  . Vaping Use: Never used  Substance Use Topics  . Alcohol use: No    Alcohol/week: 0.0 standard drinks    Comment: heavy drinker until 1995 - sobriety with AA  . Drug use: No    Home Medications Prior to Admission medications   Medication Sig Start Date End Date Taking? Authorizing Provider  amLODipine (NORVASC) 5 MG tablet TAKE 1 TABLET DAILY 02/26/20   Burchette, Alinda Sierras, MD  Calcium Carb-Cholecalciferol (CALCIUM CARBONATE-VITAMIN D3 PO) Take 1 tablet by mouth daily    [provider]  cholecalciferol (VITAMIN D) 1000 UNITS tablet Take 1,000 Units by mouth daily.      [provider]  denosumab (PROLIA) 60 MG/ML SOLN Inject 60 mg into the skin every 6 (six) months.      [provider]  diclofenac sodium (VOLTAREN) 1 % GEL APPLY 2 GRAMS TOPICALLY FOUR TIMES A DAY 05/12/19   Kathrynn Ducking, MD  ferrous sulfate 325 (65 FE) MG tablet Take 325 mg by mouth 2 (two) times daily.     [provider]  furosemide (LASIX) 40 MG tablet TAKE 1 TABLET DAILY 10/23/19   Burchette, Alinda Sierras, MD  gabapentin (NEURONTIN) 300 MG capsule Take 2 capsules (600 mg total) by mouth 2 (two) times daily. 12/01/19   Kathrynn Ducking, MD  Golimumab [facility] ARIA IV) Inject into the vein. Takes infusion every 8 wks.    [provider]  lidocaine (LIDODERM) 5 % 1 patch daily. Patient not taking: Reported on 08/22/2020 08/16/20   [provider]  Multiple Vitamins-Minerals (MULTIVITAMIN,TX-MINERALS) tablet Take 1 tablet by mouth daily.      [provider]  oxyCODONE (OXYCONTIN) 10 mg 12 hr tablet Take one tablet by mouth every 12 hours.  May refill in one month 07/13/20   Burchette, Alinda Sierras, MD  oxyCODONE (OXYCONTIN) 10 mg 12 hr tablet Take 1 tablet (10 mg total) by mouth every 12 (twelve) hours. 07/13/20   Burchette, Alinda Sierras, MD   oxyCODONE (OXYCONTIN) 10 mg 12 hr tablet Take 1 tablet by mouth every 12 hours.  May refill in 2 months 07/13/20   Burchette, Alinda Sierras, MD  Oxycodone HCl 10 MG TABS Take 1 tablet (10 mg total) by mouth every 6 (six) hours as needed. Take 1 tablet by mouth every 6 hours as needed for breakthrough pain 07/13/20   Eulas Post, MD  potassium chloride (KLOR-CON) 10 MEQ tablet TAKE 1 TABLET TWICE A DAY 12/19/19   Burchette,  Alinda Sierras, MD  predniSONE (DELTASONE) 1 MG tablet TAKE 4 TABLETS DAILY WITH BREAKFAST 03/23/20   Kathrynn Ducking, MD  simvastatin (ZOCOR) 5 MG tablet TAKE 1 TABLET AT BEDTIME 03/23/20   Burchette, Alinda Sierras, MD  tizanidine (ZANAFLEX) 2 MG capsule Take 1 capsule (2 mg total) by mouth as needed for muscle spasms (take 1 at bedtime as needed for muscle spasms). 01/05/20   Kathrynn Ducking, MD  potassium chloride (K-DUR) 10 MEQ tablet Take 1 tablet (10 mEq total) by mouth 2 (two) times daily. 12/29/12 07/13/20  Norins, Heinz Knuckles, MD    Allergies    Hydromorphone  Review of Systems   Review of Systems  Constitutional: Negative for fever.  HENT: Negative for ear pain.   Eyes: Negative for pain.  Respiratory: Negative for cough.   Cardiovascular: Negative for chest pain.  Gastrointestinal: Negative for abdominal pain.  Genitourinary: Negative for flank pain.  Musculoskeletal: Negative for back pain.  Skin: Negative for rash.  Neurological: Negative for headaches.    Physical Exam Updated Vital Signs BP 134/79   Pulse 83   Temp 97.9 F (36.6 C) (Oral)   Resp 18   Ht 5\' 1"  (1.549 m)   Wt 64.4 kg   SpO2 98%   BMI 26.83 kg/m   Physical Exam Constitutional:      General: She is not in acute distress.    Appearance: Normal appearance.  HENT:     Head: Normocephalic.     Nose: Nose normal.  Eyes:     Extraocular Movements: Extraocular movements intact.  Cardiovascular:     Rate and Rhythm: Normal rate.  Pulmonary:     Effort: Pulmonary effort is normal.   Abdominal:     Palpations: Abdomen is soft.     Tenderness: There is no abdominal tenderness.  Musculoskeletal:        General: Normal range of motion.     Cervical back: Normal range of motion.     Right lower leg: Edema present.     Left lower leg: Edema present.     Comments: There is swelling and induration of bilateral lower extremities consistent with venous stasis disease.  No abnormal warmth or cellulitis noted.  Questionable mild increased welling of the left lower extremity compared to the right however.  Neurovascularly intact and compartments otherwise soft.  Skin:    General: Skin is warm.  Neurological:     General: No focal deficit present.     Mental Status: She is alert.     ED Results / Procedures / Treatments   Labs (all labs ordered are listed, but only abnormal results are displayed) Labs Reviewed  BASIC METABOLIC PANEL  CBC WITH DIFFERENTIAL/PLATELET    EKG None  Radiology No results found.  Procedures Procedures (including critical care time)  Medications Ordered in ED Medications - No data to display  ED Course  I have reviewed the triage vital signs and the nursing notes.  Pertinent labs & imaging results that were available during my care of the patient were reviewed by me and considered in my medical decision making (see chart for details).    MDM Rules/Calculators/A&P                          Labs are ordered and pending.  Ultrasound also ordered and pending final result.  Patient will be signed out to oncoming physician provider.  Final Clinical Impression(s) / ED Diagnoses  Final diagnoses:  None    Rx / DC Orders ED Discharge Orders    None       Luna Fuse, MD 09/14/20 [phone]

## 2020-09-14 NOTE — ED Provider Notes (Signed)
Care transferred to me.  Patient's labs are unremarkable besides mild hypokalemia.  Renal function is at baseline.  White blood cells are normal.  DVT ultrasound without obvious DVT.  On my exam she has bilateral lower extremity edema with what appears to be venous stasis.  I do not think this is cellulitis.  Discussed compression stockings and elevation of legs.  Follow-up with PCP.   Sherwood Gambler, MD 09/14/20 [phone]

## 2020-09-14 NOTE — ED Triage Notes (Signed)
C/o bil leg swelling x 2 days °

## 2020-09-22 ENCOUNTER — Telehealth: Payer: Self-pay | Admitting: Family Medicine

## 2020-09-22 NOTE — Telephone Encounter (Signed)
Left a detailed message at the pts cell number stating she can contact her pharmacy and ask that the Rx be switched to the [facility] of her choice.

## 2020-09-22 NOTE — Telephone Encounter (Signed)
The patient contacted the pharmacy and they stated that her doctor would have to send a new Rx for oxyCODONE (OXYCONTIN) 10 mg 12 hr tablet  [facility] [place] [place] - [address] Phone:  [phone]  Fax:  [phone]     She is completely out of this medication and in a pain and need this called in before the holidays.

## 2020-09-22 NOTE — Telephone Encounter (Signed)
Patient is calling and stated that the provider sent her refill for oxycodone but the pharmacy where she gets her medication is out. Patient wanted to see if medication can be switched to [facility] on [address], please advise. CB is [phone]

## 2020-09-24 MED ORDER — OXYCODONE HCL ER 10 MG PO T12A: 10.0000 mg | EXTENDED_RELEASE_TABLET | Freq: Two times a day (BID) | ORAL | 0 refills | Status: DC

## 2020-09-24 NOTE — Telephone Encounter (Signed)
I did not see this message until 09-24-20.  This came in 2 days ago and I was out of office then.   [facility] [address]

## 2020-09-24 NOTE — Addendum Note (Signed)
Addended by: Eulas Post on: 09/24/2020 03:21 PM   Modules accepted: Orders

## 2020-09-26 ENCOUNTER — Other Ambulatory Visit: Payer: Self-pay | Admitting: Family Medicine

## 2020-09-26 NOTE — Telephone Encounter (Signed)
Pt is calling in stating that she would like to see if her Oxycodone (OXYCONTIN) 10 mg 12 hr her original pharmacy stated that they do not have any and would like to see if it can be sent over there.  Pharm:  [facility] on [address]

## 2020-09-26 NOTE — Telephone Encounter (Signed)
Pt  Called back  want  rx  oxycodone ( OXYCONTIN  10 MG 12 HR   Called into  This  Pharmacy[facility] [place]  Do not have

## 2020-09-27 MED ORDER — OXYCODONE HCL ER 10 MG PO T12A: 10.0000 mg | EXTENDED_RELEASE_TABLET | Freq: Two times a day (BID) | ORAL | 0 refills | Status: DC

## 2020-09-27 NOTE — Telephone Encounter (Signed)
Oxycodone refill request to be sent to a different pharmacy.

## 2020-09-27 NOTE — Telephone Encounter (Signed)
Refill sent.

## 2020-10-04 DIAGNOSIS — Q828 Other specified congenital malformations of skin: Secondary | ICD-10-CM | POA: Diagnosis not present

## 2020-10-04 DIAGNOSIS — M898X7 Other specified disorders of bone, ankle and foot: Secondary | ICD-10-CM | POA: Diagnosis not present

## 2020-10-04 DIAGNOSIS — M19079 Primary osteoarthritis, unspecified ankle and foot: Secondary | ICD-10-CM | POA: Diagnosis not present

## 2020-10-17 ENCOUNTER — Other Ambulatory Visit: Payer: Self-pay | Admitting: Family Medicine

## 2020-10-17 ENCOUNTER — Encounter: Payer: Self-pay | Admitting: Family Medicine

## 2020-10-18 ENCOUNTER — Encounter: Payer: Self-pay | Admitting: Family Medicine

## 2020-10-18 ENCOUNTER — Ambulatory Visit (INDEPENDENT_AMBULATORY_CARE_PROVIDER_SITE_OTHER): Payer: Medicare Other | Admitting: Family Medicine

## 2020-10-18 ENCOUNTER — Other Ambulatory Visit: Payer: Self-pay

## 2020-10-18 VITALS — BP 142/84 | HR 74 | Ht 61.0 in | Wt 140.0 lb

## 2020-10-18 DIAGNOSIS — G8929 Other chronic pain: Secondary | ICD-10-CM

## 2020-10-18 DIAGNOSIS — E785 Hyperlipidemia, unspecified: Secondary | ICD-10-CM | POA: Diagnosis not present

## 2020-10-18 DIAGNOSIS — N183 Chronic kidney disease, stage 3 unspecified: Secondary | ICD-10-CM | POA: Diagnosis not present

## 2020-10-18 DIAGNOSIS — M4802 Spinal stenosis, cervical region: Secondary | ICD-10-CM | POA: Diagnosis not present

## 2020-10-18 DIAGNOSIS — M069 Rheumatoid arthritis, unspecified: Secondary | ICD-10-CM | POA: Diagnosis not present

## 2020-10-18 DIAGNOSIS — I10 Essential (primary) hypertension: Secondary | ICD-10-CM

## 2020-10-18 DIAGNOSIS — I129 Hypertensive chronic kidney disease with stage 1 through stage 4 chronic kidney disease, or unspecified chronic kidney disease: Secondary | ICD-10-CM | POA: Diagnosis not present

## 2020-10-18 DIAGNOSIS — J8410 Pulmonary fibrosis, unspecified: Secondary | ICD-10-CM | POA: Diagnosis not present

## 2020-10-18 DIAGNOSIS — E876 Hypokalemia: Secondary | ICD-10-CM | POA: Diagnosis not present

## 2020-10-18 LAB — BASIC METABOLIC PANEL
BUN: 24 mg/dL — ABNORMAL HIGH (ref 6–23)
CO2: 34 mEq/L — ABNORMAL HIGH (ref 19–32)
Calcium: 10 mg/dL (ref 8.4–10.5)
Chloride: 101 mEq/L (ref 96–112)
Creatinine, Ser: 1.19 mg/dL (ref 0.40–1.20)
GFR: 43.2 mL/min — ABNORMAL LOW (ref >60.00)
Glucose, Bld: 100 mg/dL — ABNORMAL HIGH (ref 70–99)
Potassium: 4.6 mEq/L (ref 3.5–5.1)
Sodium: 140 mEq/L (ref 135–145)

## 2020-10-18 LAB — LIPID PANEL
Cholesterol: 144 mg/dL (ref 0–200)
HDL: 62.5 mg/dL (ref >39.00)
LDL Cholesterol: 63 mg/dL (ref 0–99)
NonHDL: 81.7
Total CHOL/HDL Ratio: 2
Triglycerides: 92 mg/dL (ref 0.0–149.0)
VLDL: 18.4 mg/dL (ref 0.0–40.0)

## 2020-10-18 LAB — HEPATIC FUNCTION PANEL
ALT: 22 U/L (ref 0–35)
AST: 28 U/L (ref 0–37)
Albumin: 4.1 g/dL (ref 3.5–5.2)
Alkaline Phosphatase: 69 U/L (ref 39–117)
Bilirubin, Direct: 0.2 mg/dL (ref 0.0–0.3)
Total Bilirubin: 0.8 mg/dL (ref 0.2–1.2)
Total Protein: 7.4 g/dL (ref 6.0–8.3)

## 2020-10-18 MED ORDER — OXYCODONE HCL ER 10 MG PO T12A: 10.0000 mg | EXTENDED_RELEASE_TABLET | Freq: Two times a day (BID) | ORAL | 0 refills | Status: DC

## 2020-10-18 MED ORDER — OXYCODONE HCL ER 10 MG PO T12A: EXTENDED_RELEASE_TABLET | ORAL | 0 refills | Status: DC

## 2020-10-18 MED ORDER — OXYCODONE HCL 10 MG PO TABS: 10.0000 mg | ORAL_TABLET | Freq: Four times a day (QID) | ORAL | 0 refills | Status: DC | PRN

## 2020-10-18 NOTE — Progress Notes (Addendum)
Established Patient Office Visit  Subjective:  Patient ID: [name], female    DOB: 08-08-1941  Age: 80 y.o. MRN: [id number]  CC:  Chief Complaint  Patient presents with  . Pain Management    HPI [name] presents for medical follow-up.  She has chronic back pain has been on OxyContin 10 mg extended release twice daily for several years.  Generally pain fairly well controlled with that.  She does take occasional supplement with oxycodone 10 mg.  No history of misuse.  History of bilateral leg edema which prompted ER visit 12/22.  She stated her friend was visiting in town and they had been shopping a lot and perhaps eating out more than usual.  She had bilateral swelling with no dyspnea.  Venous Dopplers revealed no DVT.  Labs unremarkable except for mild hypokalemia with potassium 3.4.  She does have chronic kidney disease followed by nephrology.  She is on Lasix and takes potassium supplement  She has hyperlipidemia treated with low-dose simvastatin.  Due for follow-up labs.  Past Medical History:  Diagnosis Date  . Allergic rhinitis   . Anemia   . Anxiety   . Bronchiectasis   . Carpal tunnel syndrome 07/13/2015   Bilateral  . CVA (cerebral infarction) 10/2007   Right thalamic   . Diverticulosis of colon   . Gait disorder   . GERD (gastroesophageal reflux disease)   . History of breast cancer   . HTN (hypertension)   . Hyperlipidemia   . Idiopathic pulmonary fibrosis   . Left knee DJD   . Migraine   . Osteoporosis   . Peripheral neuropathy   . PMR (polymyalgia rheumatica) (HCC)   . Polyneuropathy in other diseases classified elsewhere ([place]) 02/17/2013  . Previous back surgery 10/09/12  . Renal insufficiency   . Rheumatoid arthritis ([place])   . Seizure disorder ([place])   . Spondylosis, cervical, with myelopathy 08/31/2015   C3-4 myelopathy  . Temporal arteritis (HCC)    Right eye blind, on steroids per [facility]eruro/ Dr [name]  . Type II or unspecified type  diabetes mellitus without mention of complication, not stated as uncontrolled    2nd to steriods  . Vitamin D deficiency     Past Surgical History:  Procedure Laterality Date  . CARPAL TUNNEL RELEASE Right   . CATARACT EXTRACTION     OS - Summer 2010  . CERVICAL FUSION  2010   4 rods and pins in place  . CHOLECYSTECTOMY    . COLONOSCOPY  12/15/2011   Procedure: COLONOSCOPY;  Surgeon: [name], MD;  Location: [facility];  Service: Endoscopy;  Laterality: N/A;  . LUMBAR FUSION  04/2010   W/Mechanical fixation - [place]  2013   rods in hips (to stabilize).  Marland Kitchen MASTECTOMY    . NECK SURGERY    . rheumatoid nodule removal    . SPINAL FUSION  07/2010   T10-L2 interbody fusion / [facility]  . TONSILLECTOMY    . TOTAL KNEE ARTHROPLASTY     Left    Family History  Problem Relation Age of Onset  . Brain cancer Mother   . Hypertension Mother   . Arthritis Mother   . Dementia Father   . Hypertension Father   . Arthritis Father   . Breast cancer Sister   . Lung cancer Sister   . Lung disease Neg Hx   . Rheumatologic disease Neg Hx     Social  History   Socioeconomic History  . Marital status: Widowed    Spouse name: Not on file  . Number of children: 1  . Years of education: 12+ coll.  . Highest education level: Not on file  Occupational History  . Occupation: Education officer, environmental: RETIRED    Comment: retired  Tobacco Use  . Smoking status: Never Smoker  . Smokeless tobacco: Never Used  . Tobacco comment: Father   Vaping Use  . Vaping Use: Never used  Substance and Sexual Activity  . Alcohol use: No    Alcohol/week: 0.0 standard drinks    Comment: heavy drinker until 1995 - sobriety with AA  . Drug use: No  . Sexual activity: Not Currently  Other Topics Concern  . Not on file  Social History Narrative      Married - husband invalid with stroke - widowed in 2009. 1 chil. retired -  Solicitor and Photographer. Lives alone. ACP - not formerly discussed - wishes to be a full code.    Patient is right handed. Patient consumes tea 4  times daily.         Patient drinks about 4-5 cups of caffeine daily.   Patient is right handed.       [facility] (04/25/17):   Originally from [place], [place]. Previously lived in [place]. 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 No bird or mold exposure. No hot tub exposure. Has traveled to [place], [place], and [place] No pets currently. Enjoys going to the beach.       Social Determinants of Health   Financial Resource Strain: Not on file  Food Insecurity: Not on file  Transportation Needs: Not on file  Physical Activity: Not on file  Stress: Not on file  Social Connections: Moderately Integrated  . Frequency of Communication with Friends and Family: More than three times a week  . Frequency of Social Gatherings with Friends and Family: Once a week  . Attends Religious Services: 1 to 4 times per year  . Active Member of Clubs or Organizations: Yes  . Attends Archivist Meetings: More than 4 times per year  . Marital Status: Widowed  Intimate Partner Violence: Not on file    Outpatient Medications Prior to Visit  Medication Sig Dispense Refill  . amLODipine (NORVASC) 5 MG tablet TAKE 1 TABLET DAILY 90 tablet 3  . Calcium Carb-Cholecalciferol (CALCIUM CARBONATE-VITAMIN D3 PO) Take 1 tablet by mouth daily    . cholecalciferol (VITAMIN D) 1000 UNITS tablet Take 1,000 Units by mouth daily.    Marland Kitchen denosumab (PROLIA) 60 MG/ML SOLN injection Inject 60 mg into the skin every 6 (six) months.    . diclofenac sodium (VOLTAREN) 1 % GEL APPLY 2 GRAMS TOPICALLY FOUR TIMES A DAY 400 g 6  . ferrous sulfate 325 (65 FE) MG tablet Take 325 mg by mouth 2 (two) times daily.    . furosemide (LASIX) 40 MG tablet TAKE 1 TABLET DAILY 90 tablet 3  . gabapentin  (NEURONTIN) 300 MG capsule Take 2 capsules (600 mg total) by mouth 2 (two) times daily. 360 capsule 3  . Golimumab ([place] ARIA IV) Inject into the vein. Takes infusion every 8 wks.    . lidocaine (LIDODERM) 5 % 1 patch daily.    . Multiple Vitamins-Minerals (MULTIVITAMIN,TX-MINERALS) tablet Take 1 tablet by mouth daily.    . potassium  chloride (KLOR-CON) 10 MEQ tablet TAKE 1 TABLET TWICE A DAY 180 tablet 3  . predniSONE (DELTASONE) 1 MG tablet TAKE 4 TABLETS DAILY WITH BREAKFAST 360 tablet 3  . simvastatin (ZOCOR) 5 MG tablet TAKE 1 TABLET AT BEDTIME 90 tablet 3  . tizanidine (ZANAFLEX) 2 MG capsule Take 1 capsule (2 mg total) by mouth as needed for muscle spasms (take 1 at bedtime as needed for muscle spasms). 30 capsule 3  . oxyCODONE (OXYCONTIN) 10 mg 12 hr tablet Take one tablet by mouth every 12 hours.  May refill in one month 60 tablet 0  . oxyCODONE (OXYCONTIN) 10 mg 12 hr tablet Take 1 tablet by mouth every 12 hours.  May refill in 2 months 60 tablet 0  . oxyCODONE (OXYCONTIN) 10 mg 12 hr tablet Take 1 tablet (10 mg total) by mouth every 12 (twelve) hours. 60 tablet 0  . Oxycodone HCl 10 MG TABS Take 1 tablet (10 mg total) by mouth every 6 (six) hours as needed. Take 1 tablet by mouth every 6 hours as needed for breakthrough pain 90 tablet 0   No facility-administered medications prior to visit.    Allergies  Allergen Reactions  . Hydromorphone Other (See Comments)    Cognitive changes  "made me unconscious"    ROS Review of Systems  Constitutional: Negative for fatigue.  Eyes: Negative for visual disturbance.  Respiratory: Negative for cough, chest tightness, shortness of breath and wheezing.   Cardiovascular: Negative for chest pain, palpitations and leg swelling.  Musculoskeletal: Positive for back pain.  Neurological: Negative for dizziness, seizures, syncope, weakness, light-headedness and headaches.      Objective:    Physical Exam Vitals reviewed.   Constitutional:      Appearance: Normal appearance.  Cardiovascular:     Rate and Rhythm: Normal rate and regular rhythm.  Pulmonary:     Effort: Pulmonary effort is normal.     Breath sounds: No wheezing.     Comments: Bibasilar rales which are chronic. Musculoskeletal:     Comments: No pitting edema  Neurological:     Mental Status: She is alert.     BP (!) 142/84   Pulse 74   Ht 5\' 1"  (1.549 m)   Wt 140 lb (63.5 kg)   SpO2 94%   BMI 26.45 kg/m  Wt Readings from Last 3 Encounters:  10/18/20 140 lb (63.5 kg)  09/14/20 142 lb (64.4 kg)  08/22/20 146 lb 6.4 oz (66.4 kg)     Health Maintenance Due  Topic Date Due  . OPHTHALMOLOGY EXAM  Never done  . FOOT EXAM  04/26/2018  . HEMOGLOBIN A1C  04/29/2018  . COVID-19 Vaccine (3 - Pfizer risk 4-dose series) 12/27/2019  . MAMMOGRAM  10/11/2020    There are no preventive care reminders to display for this patient.  Lab Results  Component Value Date   TSH 2.13 01/08/2020   Lab Results  Component Value Date   WBC 7.6 09/14/2020   HGB 13.5 09/14/2020   HCT 40.9 09/14/2020   MCV 98.8 09/14/2020   PLT 232 09/14/2020   Lab Results  Component Value Date   NA 139 09/14/2020   K 3.4 (L) 09/14/2020   CO2 27 09/14/2020   GLUCOSE 88 09/14/2020   BUN 18 09/14/2020   CREATININE 1.18 (H) 09/14/2020   BILITOT 0.8 04/25/2017   ALKPHOS 82 06/25/2018   AST 26 06/25/2018   ALT 20 06/25/2018   PROT 7.1 04/25/2017   ALBUMIN 4.0  04/25/2017   CALCIUM 8.5 (L) 09/14/2020   ANIONGAP 10 09/14/2020   GFR 38.74 (L) 03/16/2020   Lab Results  Component Value Date   CHOL 147 04/03/2019   Lab Results  Component Value Date   HDL 72.40 04/03/2019   Lab Results  Component Value Date   LDLCALC 60 04/03/2019   Lab Results  Component Value Date   TRIG 74.0 04/03/2019   Lab Results  Component Value Date   CHOLHDL 2 04/03/2019   Lab Results  Component Value Date   HGBA1C 5.3 10/30/2017      Assessment & Plan:   #1  chronic back pain stable on OxyContin.  Refilled her OxyContin and oxycodone.  Obtain follow-up pain management profile.  [facility] drug registry reviewed and no red flags  #2 hypertension stable  #3 dyslipidemia.  Treated with low-dose simvastatin -Recheck lipid and hepatic panel  #4 recent mild hypokalemia -Recheck basic metabolic panel  #5 peripheral edema likely related to venous stasis -Continue compression hose and frequent elevation -continue Lasix 40 mg daily -suggest monitor with daily weights  Meds ordered this encounter  Medications  . Oxycodone HCl 10 MG TABS    Sig: Take 1 tablet (10 mg total) by mouth every 6 (six) hours as needed. Take 1 tablet by mouth every 6 hours as needed for breakthrough pain    Dispense:  90 tablet    Refill:  0  . oxyCODONE (OXYCONTIN) 10 mg 12 hr tablet    Sig: Take one tablet by mouth every 12 hours.  May refill in one month    Dispense:  60 tablet    Refill:  0  . oxyCODONE (OXYCONTIN) 10 mg 12 hr tablet    Sig: Take 1 tablet by mouth every 12 hours.  May refill in 2 months    Dispense:  60 tablet    Refill:  0  . oxyCODONE (OXYCONTIN) 10 mg 12 hr tablet    Sig: Take 1 tablet (10 mg total) by mouth every 12 (twelve) hours.    Dispense:  60 tablet    Refill:  0    Follow-up: Return in about 3 months (around 01/16/2021).    Carolann Littler, MD

## 2020-10-19 DIAGNOSIS — M40294 Other kyphosis, thoracic region: Secondary | ICD-10-CM | POA: Diagnosis not present

## 2020-10-19 DIAGNOSIS — S22080D Wedge compression fracture of T11-T12 vertebra, subsequent encounter for fracture with routine healing: Secondary | ICD-10-CM | POA: Diagnosis not present

## 2020-10-19 DIAGNOSIS — M545 Low back pain, unspecified: Secondary | ICD-10-CM | POA: Diagnosis not present

## 2020-10-19 DIAGNOSIS — S22070D Wedge compression fracture of T9-T10 vertebra, subsequent encounter for fracture with routine healing: Secondary | ICD-10-CM | POA: Diagnosis not present

## 2020-10-19 DIAGNOSIS — Z981 Arthrodesis status: Secondary | ICD-10-CM | POA: Diagnosis not present

## 2020-10-19 DIAGNOSIS — M79672 Pain in left foot: Secondary | ICD-10-CM | POA: Diagnosis not present

## 2020-10-19 NOTE — Progress Notes (Signed)
Mychart message sent: Labs all stable.  Potassium now normal.   Lipids well controlled.  Liver panel normal   kidney function stable.

## 2020-10-20 LAB — DRUG MONITOR, PANEL 1, W/CONF, URINE
Amphetamines: NEGATIVE ng/mL (ref <500)
Barbiturates: NEGATIVE ng/mL (ref <300)
Benzodiazepines: NEGATIVE ng/mL (ref <100)
Cocaine Metabolite: NEGATIVE ng/mL (ref <150)
Codeine: NEGATIVE ng/mL (ref <50)
Creatinine: 47 mg/dL
Hydrocodone: NEGATIVE ng/mL (ref <50)
Hydromorphone: NEGATIVE ng/mL (ref <50)
Marijuana Metabolite: NEGATIVE ng/mL (ref <20)
Methadone Metabolite: NEGATIVE ng/mL (ref <100)
Morphine: NEGATIVE ng/mL (ref <50)
Norhydrocodone: NEGATIVE ng/mL (ref <50)
Noroxycodone: 1658 ng/mL — ABNORMAL HIGH (ref <50)
Opiates: NEGATIVE ng/mL (ref <100)
Oxidant: NEGATIVE ug/mL
Oxycodone: 1089 ng/mL — ABNORMAL HIGH (ref <50)
Oxycodone: POSITIVE ng/mL — AB (ref <100)
Oxymorphone: 1277 ng/mL — ABNORMAL HIGH (ref <50)
Phencyclidine: NEGATIVE ng/mL (ref <25)
pH: 6.5 (ref 4.5–9.0)

## 2020-10-20 LAB — DM TEMPLATE

## 2020-10-21 ENCOUNTER — Ambulatory Visit: Payer: TRICARE For Life (TFL) | Admitting: Family Medicine

## 2020-10-26 DIAGNOSIS — M0589 Other rheumatoid arthritis with rheumatoid factor of multiple sites: Secondary | ICD-10-CM | POA: Diagnosis not present

## 2020-11-01 DIAGNOSIS — M25551 Pain in right hip: Secondary | ICD-10-CM | POA: Diagnosis not present

## 2020-11-01 DIAGNOSIS — Z789 Other specified health status: Secondary | ICD-10-CM | POA: Diagnosis not present

## 2020-11-01 DIAGNOSIS — Z7409 Other reduced mobility: Secondary | ICD-10-CM | POA: Diagnosis not present

## 2020-11-01 DIAGNOSIS — Z981 Arthrodesis status: Secondary | ICD-10-CM | POA: Diagnosis not present

## 2020-11-08 DIAGNOSIS — Z981 Arthrodesis status: Secondary | ICD-10-CM | POA: Diagnosis not present

## 2020-11-08 DIAGNOSIS — Z789 Other specified health status: Secondary | ICD-10-CM | POA: Diagnosis not present

## 2020-11-08 DIAGNOSIS — Z7409 Other reduced mobility: Secondary | ICD-10-CM | POA: Diagnosis not present

## 2020-11-08 DIAGNOSIS — M25551 Pain in right hip: Secondary | ICD-10-CM | POA: Diagnosis not present

## 2020-11-10 DIAGNOSIS — Z789 Other specified health status: Secondary | ICD-10-CM | POA: Diagnosis not present

## 2020-11-10 DIAGNOSIS — Z7409 Other reduced mobility: Secondary | ICD-10-CM | POA: Diagnosis not present

## 2020-11-10 DIAGNOSIS — Z981 Arthrodesis status: Secondary | ICD-10-CM | POA: Diagnosis not present

## 2020-11-10 DIAGNOSIS — M25551 Pain in right hip: Secondary | ICD-10-CM | POA: Diagnosis not present

## 2020-11-11 ENCOUNTER — Other Ambulatory Visit: Payer: Self-pay

## 2020-11-14 ENCOUNTER — Other Ambulatory Visit: Payer: Self-pay

## 2020-11-14 ENCOUNTER — Ambulatory Visit (INDEPENDENT_AMBULATORY_CARE_PROVIDER_SITE_OTHER): Payer: Medicare Other

## 2020-11-14 ENCOUNTER — Ambulatory Visit (INDEPENDENT_AMBULATORY_CARE_PROVIDER_SITE_OTHER): Payer: Medicare Other | Admitting: Family Medicine

## 2020-11-14 VITALS — BP 144/80 | HR 97 | Ht 61.0 in | Wt 139.0 lb

## 2020-11-14 DIAGNOSIS — Z981 Arthrodesis status: Secondary | ICD-10-CM | POA: Diagnosis not present

## 2020-11-14 DIAGNOSIS — R102 Pelvic and perineal pain: Secondary | ICD-10-CM | POA: Diagnosis not present

## 2020-11-14 DIAGNOSIS — Z853 Personal history of malignant neoplasm of breast: Secondary | ICD-10-CM

## 2020-11-14 DIAGNOSIS — M898X8 Other specified disorders of bone, other site: Secondary | ICD-10-CM

## 2020-11-14 DIAGNOSIS — M160 Bilateral primary osteoarthritis of hip: Secondary | ICD-10-CM | POA: Diagnosis not present

## 2020-11-14 DIAGNOSIS — R296 Repeated falls: Secondary | ICD-10-CM | POA: Diagnosis not present

## 2020-11-14 NOTE — Progress Notes (Signed)
Established Patient Office Visit  Subjective:  Patient ID: [name], female    DOB: [birth year]  Age: 80 y.o. MRN: [id number]  CC:  Chief Complaint  Patient presents with  . Hip Pain    HPI [name] presents for complaints of right "hip "pain.  Her pain location is actually right iliac crest.  She states this started a few months ago.  She has had some recent falls but is not sure this is related to the fall.  She does not have any pain in the actual hip joint region.  She has tried some heat without much relief.  He has not noted any visible swelling or bruising in that region.  She has very localized tenderness over a specific point of the iliac crest.  No difficulty with ambulation.  Has had some balance issues in general and is getting physical therapy for that and thinks that may be helping.  She ambulates with a walker.  No low back pain currently.  No radiculitis symptoms.  No appetite or weight changes.  Remote history of breast cancer with previous left mastectomy back in the 80s.  No recent appetite or weight changes  Previous hip films from 07/16/2018 reviewed.  She did not have any major osteoarthritis changes of the right hip at that time.  Pelvic bones looked unremarkable that time.  Past Medical History:  Diagnosis Date  . Allergic rhinitis   . Anemia   . Anxiety   . Bronchiectasis   . Carpal tunnel syndrome 07/13/2015   Bilateral  . CVA (cerebral infarction) 10/2007   Right thalamic   . Diverticulosis of colon   . Gait disorder   . GERD (gastroesophageal reflux disease)   . History of breast cancer   . HTN (hypertension)   . Hyperlipidemia   . Idiopathic pulmonary fibrosis   . Left knee DJD   . Migraine   . Osteoporosis   . Peripheral neuropathy   . PMR (polymyalgia rheumatica) (HCC)   . Polyneuropathy in other diseases classified elsewhere ([place]) 02/17/2013  . Previous back surgery 10/09/12  . Renal insufficiency   . Rheumatoid arthritis ([place])    . Seizure disorder ([place])   . Spondylosis, cervical, with myelopathy 08/31/2015   C3-4 myelopathy  . Temporal arteritis (HCC)    Right eye blind, on steroids per [facility]/ Dr [name]  . Type II or unspecified type diabetes mellitus without mention of complication, not stated as uncontrolled    2nd to steriods  . Vitamin D deficiency     Past Surgical History:  Procedure Laterality Date  . CARPAL TUNNEL RELEASE Right   . CATARACT EXTRACTION     OS - Summer 2010  . CERVICAL FUSION  2010   4 rods and pins in place  . CHOLECYSTECTOMY    . COLONOSCOPY  12/15/2011   Procedure: COLONOSCOPY;  Surgeon: [name], MD;  Location: [facility];  Service: Endoscopy;  Laterality: N/A;  . LUMBAR FUSION  04/2010   W/Mechanical fixation - [facility]  2013   rods in hips (to stabilize).  Marland Kitchen MASTECTOMY    . NECK SURGERY    . rheumatoid nodule removal    . SPINAL FUSION  07/2010   T10-L2 interbody fusion / [facility]  . TONSILLECTOMY    . TOTAL KNEE ARTHROPLASTY     Left    Family History  Problem Relation Age of Onset  . Brain cancer Mother   .  Hypertension Mother   . Arthritis Mother   . Dementia Father   . Hypertension Father   . Arthritis Father   . Breast cancer Sister   . Lung cancer Sister   . Lung disease Neg Hx   . Rheumatologic disease Neg Hx     Social History   Socioeconomic History  . Marital status: Widowed    Spouse name: Not on file  . Number of children: 1  . Years of education: 12+ coll.  . Highest education level: Not on file  Occupational History  . Occupation: Education officer, environmental: RETIRED    Comment: retired  Tobacco Use  . Smoking status: Never Smoker  . Smokeless tobacco: Never Used  . Tobacco comment: Father   Vaping Use  . Vaping Use: Never used  Substance and Sexual Activity  . Alcohol use: No    Alcohol/week: 0.0 standard drinks    Comment: heavy drinker until 1995 -  sobriety with AA  . Drug use: No  . Sexual activity: Not Currently  Other Topics Concern  . Not on file  Social History Narrative      Married - husband invalid with stroke - widowed in 2009. 1 chil. 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 ACP - not formerly discussed - wishes to be a full code.    Patient is right handed. Patient consumes tea 4  times daily.         Patient drinks about 4-5 cups of caffeine daily.   Patient is right handed.       [facility] (04/25/17):   Originally from [place], [place]. Previously lived in [place]. 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 No bird or mold exposure. No hot tub exposure. Has traveled to [place], [place], and [place] No pets currently. Enjoys going to the beach.       Social Determinants of Health   Financial Resource Strain: Not on file  Food Insecurity: Not on file  Transportation Needs: Not on file  Physical Activity: Not on file  Stress: Not on file  Social Connections: Moderately Integrated  . Frequency of Communication with Friends and Family: More than three times a week  . Frequency of Social Gatherings with Friends and Family: Once a week  . Attends Religious Services: 1 to 4 times per year  . Active Member of Clubs or Organizations: Yes  . Attends Archivist Meetings: More than 4 times per year  . Marital Status: Widowed  Intimate Partner Violence: Not on file    Outpatient Medications Prior to Visit  Medication Sig Dispense Refill  . amLODipine (NORVASC) 5 MG tablet TAKE 1 TABLET DAILY 90 tablet 3  . Calcium Carb-Cholecalciferol (CALCIUM CARBONATE-VITAMIN D3 PO) Take 1 tablet by mouth daily    . cholecalciferol (VITAMIN D) 1000 UNITS tablet Take 1,000 Units by mouth daily.    Marland Kitchen denosumab (PROLIA) 60 MG/ML SOLN injection Inject 60 mg into the skin every 6 (six) months.    . diclofenac sodium (VOLTAREN) 1 % GEL APPLY  2 GRAMS TOPICALLY FOUR TIMES A DAY 400 g 6  . ferrous sulfate 325 (65 FE) MG tablet Take 325 mg by mouth 2 (two) times daily.    . furosemide (LASIX) 40 MG tablet TAKE 1 TABLET DAILY 90 tablet 3  . gabapentin (NEURONTIN) 300 MG capsule Take 2 capsules (600 mg total) by  mouth 2 (two) times daily. 360 capsule 3  . Golimumab ([place] ARIA IV) Inject into the vein. Takes infusion every 8 wks.    . lidocaine (LIDODERM) 5 % 1 patch daily.    . Multiple Vitamins-Minerals (MULTIVITAMIN,TX-MINERALS) tablet Take 1 tablet by mouth daily.    Marland Kitchen oxyCODONE (OXYCONTIN) 10 mg 12 hr tablet Take one tablet by mouth every 12 hours.  May refill in one month 60 tablet 0  . oxyCODONE (OXYCONTIN) 10 mg 12 hr tablet Take 1 tablet by mouth every 12 hours.  May refill in 2 months 60 tablet 0  . oxyCODONE (OXYCONTIN) 10 mg 12 hr tablet Take 1 tablet (10 mg total) by mouth every 12 (twelve) hours. 60 tablet 0  . Oxycodone HCl 10 MG TABS Take 1 tablet (10 mg total) by mouth every 6 (six) hours as needed. Take 1 tablet by mouth every 6 hours as needed for breakthrough pain 90 tablet 0  . potassium chloride (KLOR-CON) 10 MEQ tablet TAKE 1 TABLET TWICE A DAY 180 tablet 3  . predniSONE (DELTASONE) 1 MG tablet TAKE 4 TABLETS DAILY WITH BREAKFAST 360 tablet 3  . simvastatin (ZOCOR) 5 MG tablet TAKE 1 TABLET AT BEDTIME 90 tablet 3  . tizanidine (ZANAFLEX) 2 MG capsule Take 1 capsule (2 mg total) by mouth as needed for muscle spasms (take 1 at bedtime as needed for muscle spasms). 30 capsule 3   No facility-administered medications prior to visit.    Allergies  Allergen Reactions  . Hydromorphone Other (See Comments)    Cognitive changes  "made me unconscious"    ROS Review of Systems  Constitutional: Negative for appetite change, chills, fever and unexpected weight change.  Respiratory: Negative for shortness of breath.   Cardiovascular: Negative for chest pain.  Gastrointestinal: Negative for abdominal pain.   Hematological: Negative for adenopathy.      Objective:    Physical Exam Vitals reviewed.  Constitutional:      Appearance: Normal appearance.  Cardiovascular:     Rate and Rhythm: Normal rate and regular rhythm.  Pulmonary:     Effort: Pulmonary effort is normal.     Breath sounds: Normal breath sounds.  Musculoskeletal:     Right lower leg: No edema.     Left lower leg: No edema.     Comments: Excellent range of motion right hip.  No greater trochanteric bursa tenderness.  She has some tenderness localized over small area of the right iliac crest laterally  Neurological:     Mental Status: She is alert.     BP (!) 144/80   Pulse 97   Ht 5\' 1"  (1.549 m)   Wt 139 lb (63 kg)   SpO2 97%   BMI 26.26 kg/m  Wt Readings from Last 3 Encounters:  11/14/20 139 lb (63 kg)  10/18/20 140 lb (63.5 kg)  09/14/20 142 lb (64.4 kg)     Health Maintenance Due  Topic Date Due  . OPHTHALMOLOGY EXAM  Never done  . FOOT EXAM  04/26/2018  . HEMOGLOBIN A1C  04/29/2018  . COVID-19 Vaccine (3 - Pfizer risk 4-dose series) 12/27/2019  . MAMMOGRAM  10/11/2020    There are no preventive care reminders to display for this patient.  Lab Results  Component Value Date   TSH 2.13 01/08/2020   Lab Results  Component Value Date   WBC 7.6 09/14/2020   HGB 13.5 09/14/2020   HCT 40.9 09/14/2020   MCV 98.8 09/14/2020   PLT  232 09/14/2020   Lab Results  Component Value Date   NA 140 10/18/2020   K 4.6 10/18/2020   CO2 34 (H) 10/18/2020   GLUCOSE 100 (H) 10/18/2020   BUN 24 (H) 10/18/2020   CREATININE 1.19 10/18/2020   BILITOT 0.8 10/18/2020   ALKPHOS 69 10/18/2020   AST 28 10/18/2020   ALT 22 10/18/2020   PROT 7.4 10/18/2020   ALBUMIN 4.1 10/18/2020   CALCIUM 10.0 10/18/2020   ANIONGAP 10 09/14/2020   GFR 43.20 (L) 10/18/2020   Lab Results  Component Value Date   CHOL 144 10/18/2020   Lab Results  Component Value Date   HDL 62.50 10/18/2020   Lab Results  Component  Value Date   LDLCALC 63 10/18/2020   Lab Results  Component Value Date   TRIG 92.0 10/18/2020   Lab Results  Component Value Date   CHOLHDL 2 10/18/2020   Lab Results  Component Value Date   HGBA1C 5.3 10/30/2017      Assessment & Plan:   Problem List Items Addressed This Visit   None   Visit Diagnoses    Iliac crest bone pain    -  Primary   Relevant Orders   DG Pelvis 1-2 Views    Patient relates 61-month history of pain as above.  She has had some recent falls but is not sure this pain is related.  She does not recall striking this area directly with any of her falls .  She denies any red flags such as weight loss, fever, etc. no history of breast cancer back in the 80s  -Start with plain films of the pelvis  No orders of the defined types were placed in this encounter.   Follow-up: No follow-ups on file.    Carolann Littler, MD

## 2020-11-15 DIAGNOSIS — Z7409 Other reduced mobility: Secondary | ICD-10-CM | POA: Diagnosis not present

## 2020-11-15 DIAGNOSIS — M25551 Pain in right hip: Secondary | ICD-10-CM | POA: Diagnosis not present

## 2020-11-15 DIAGNOSIS — Z981 Arthrodesis status: Secondary | ICD-10-CM | POA: Diagnosis not present

## 2020-11-15 DIAGNOSIS — Z789 Other specified health status: Secondary | ICD-10-CM | POA: Diagnosis not present

## 2020-11-16 ENCOUNTER — Telehealth: Payer: Self-pay

## 2020-11-16 NOTE — Telephone Encounter (Signed)
-----   Message from Eulas Post, MD sent at 11/15/2020  4:04 PM EST ----- X-rays of the pelvis show diffuse osteopenia and degenerative changes of both hips with old right superior and inferior pubic rami fractures.  No acute bony abnormality.  Specifically, no bony abnormality and area of tenderness right iliac crest region.  If bone pain persist in this region we could get further studies such as bone scan but would probably give this another few weeks to see if this starts to ease up

## 2020-11-16 NOTE — Telephone Encounter (Signed)
Informed patient of results and recommendations. 

## 2020-11-17 DIAGNOSIS — Z981 Arthrodesis status: Secondary | ICD-10-CM | POA: Diagnosis not present

## 2020-11-17 DIAGNOSIS — Z789 Other specified health status: Secondary | ICD-10-CM | POA: Diagnosis not present

## 2020-11-17 DIAGNOSIS — M25551 Pain in right hip: Secondary | ICD-10-CM | POA: Diagnosis not present

## 2020-11-17 DIAGNOSIS — Z7409 Other reduced mobility: Secondary | ICD-10-CM | POA: Diagnosis not present

## 2020-11-21 NOTE — Telephone Encounter (Signed)
Pt is calling in needing a paper copy of her last xray report that was gone over with her on last week.  Pt stated that she is not able to get in to there mychart to visible see them.  Pt would like a copy mailed to her address on file it was verified as being her correct address.

## 2020-11-22 ENCOUNTER — Other Ambulatory Visit: Payer: Self-pay | Admitting: Family Medicine

## 2020-11-22 DIAGNOSIS — M25551 Pain in right hip: Secondary | ICD-10-CM | POA: Diagnosis not present

## 2020-11-22 DIAGNOSIS — Z7409 Other reduced mobility: Secondary | ICD-10-CM | POA: Diagnosis not present

## 2020-11-22 DIAGNOSIS — Z789 Other specified health status: Secondary | ICD-10-CM | POA: Diagnosis not present

## 2020-11-22 DIAGNOSIS — Z981 Arthrodesis status: Secondary | ICD-10-CM | POA: Diagnosis not present

## 2020-11-24 DIAGNOSIS — R29898 Other symptoms and signs involving the musculoskeletal system: Secondary | ICD-10-CM | POA: Diagnosis not present

## 2020-11-24 DIAGNOSIS — R262 Difficulty in walking, not elsewhere classified: Secondary | ICD-10-CM | POA: Diagnosis not present

## 2020-11-24 DIAGNOSIS — M25551 Pain in right hip: Secondary | ICD-10-CM | POA: Diagnosis not present

## 2020-11-24 DIAGNOSIS — G8929 Other chronic pain: Secondary | ICD-10-CM | POA: Diagnosis not present

## 2020-11-24 DIAGNOSIS — M545 Low back pain, unspecified: Secondary | ICD-10-CM | POA: Diagnosis not present

## 2020-11-24 DIAGNOSIS — Z789 Other specified health status: Secondary | ICD-10-CM | POA: Diagnosis not present

## 2020-11-24 DIAGNOSIS — Z7409 Other reduced mobility: Secondary | ICD-10-CM | POA: Diagnosis not present

## 2020-11-24 DIAGNOSIS — Z981 Arthrodesis status: Secondary | ICD-10-CM | POA: Diagnosis not present

## 2020-11-29 DIAGNOSIS — L97421 Non-pressure chronic ulcer of left heel and midfoot limited to breakdown of skin: Secondary | ICD-10-CM | POA: Diagnosis not present

## 2020-11-29 DIAGNOSIS — M898X7 Other specified disorders of bone, ankle and foot: Secondary | ICD-10-CM | POA: Diagnosis not present

## 2020-11-29 DIAGNOSIS — M21072 Valgus deformity, not elsewhere classified, left ankle: Secondary | ICD-10-CM | POA: Diagnosis not present

## 2020-11-29 DIAGNOSIS — L851 Acquired keratosis [keratoderma] palmaris et plantaris: Secondary | ICD-10-CM | POA: Diagnosis not present

## 2020-11-29 DIAGNOSIS — M19079 Primary osteoarthritis, unspecified ankle and foot: Secondary | ICD-10-CM | POA: Diagnosis not present

## 2020-12-01 DIAGNOSIS — Z7409 Other reduced mobility: Secondary | ICD-10-CM | POA: Diagnosis not present

## 2020-12-01 DIAGNOSIS — M545 Low back pain, unspecified: Secondary | ICD-10-CM | POA: Diagnosis not present

## 2020-12-01 DIAGNOSIS — G8929 Other chronic pain: Secondary | ICD-10-CM | POA: Diagnosis not present

## 2020-12-01 DIAGNOSIS — R262 Difficulty in walking, not elsewhere classified: Secondary | ICD-10-CM | POA: Diagnosis not present

## 2020-12-01 DIAGNOSIS — Z981 Arthrodesis status: Secondary | ICD-10-CM | POA: Diagnosis not present

## 2020-12-01 DIAGNOSIS — M25551 Pain in right hip: Secondary | ICD-10-CM | POA: Diagnosis not present

## 2020-12-01 DIAGNOSIS — R29898 Other symptoms and signs involving the musculoskeletal system: Secondary | ICD-10-CM | POA: Diagnosis not present

## 2020-12-01 DIAGNOSIS — Z789 Other specified health status: Secondary | ICD-10-CM | POA: Diagnosis not present

## 2020-12-06 DIAGNOSIS — M545 Low back pain, unspecified: Secondary | ICD-10-CM | POA: Diagnosis not present

## 2020-12-06 DIAGNOSIS — M25551 Pain in right hip: Secondary | ICD-10-CM | POA: Diagnosis not present

## 2020-12-06 DIAGNOSIS — R262 Difficulty in walking, not elsewhere classified: Secondary | ICD-10-CM | POA: Diagnosis not present

## 2020-12-06 DIAGNOSIS — G8929 Other chronic pain: Secondary | ICD-10-CM | POA: Diagnosis not present

## 2020-12-06 DIAGNOSIS — Z981 Arthrodesis status: Secondary | ICD-10-CM | POA: Diagnosis not present

## 2020-12-06 DIAGNOSIS — Z789 Other specified health status: Secondary | ICD-10-CM | POA: Diagnosis not present

## 2020-12-06 DIAGNOSIS — R29898 Other symptoms and signs involving the musculoskeletal system: Secondary | ICD-10-CM | POA: Diagnosis not present

## 2020-12-06 DIAGNOSIS — Z7409 Other reduced mobility: Secondary | ICD-10-CM | POA: Diagnosis not present

## 2020-12-08 ENCOUNTER — Telehealth: Payer: Self-pay | Admitting: Family Medicine

## 2020-12-08 DIAGNOSIS — R102 Pelvic and perineal pain: Secondary | ICD-10-CM

## 2020-12-08 NOTE — Telephone Encounter (Signed)
Patient had an Xray done and nothing was seen and the patient is still in pain and would like a CAT Scan done to see what's wrong with her hip.

## 2020-12-09 NOTE — Telephone Encounter (Signed)
Please advise 

## 2020-12-12 NOTE — Addendum Note (Signed)
Addended by: Rodrigo Ran on: 12/12/2020 03:03 PM   Modules accepted: Orders

## 2020-12-12 NOTE — Telephone Encounter (Signed)
Pt called back and was given the below msg per Judson Roch

## 2020-12-12 NOTE — Telephone Encounter (Signed)
Let her know I have ordered CT scan.  We will try to get a soon as possible but we have to wait for approval from insurance usually.  I was debating between MRI and CT but think CT would be a good place to start.  We particularly need to consider this in view of her past history of breast cancer.  We may need to refer her to orthopedics or someone else would like to get CAT scan results first

## 2020-12-12 NOTE — Addendum Note (Signed)
Addended by: Eulas Post on: 12/12/2020 12:49 PM   Modules accepted: Orders

## 2020-12-12 NOTE — Telephone Encounter (Signed)
I left pt a voicemail letting her know that the CT scan has been ordered, we are working to get it approved through insurance and then we can get it scheduled for her.

## 2020-12-12 NOTE — Telephone Encounter (Signed)
Pt is calling in to check the status of the below msg and stated that her R hip is very painful and would like to see if she and get a referral sometime this week to have a CAT scan done.  Pt would like to have a call back once it has been done.

## 2020-12-13 DIAGNOSIS — G8929 Other chronic pain: Secondary | ICD-10-CM | POA: Diagnosis not present

## 2020-12-13 DIAGNOSIS — Z789 Other specified health status: Secondary | ICD-10-CM | POA: Diagnosis not present

## 2020-12-13 DIAGNOSIS — Z981 Arthrodesis status: Secondary | ICD-10-CM | POA: Diagnosis not present

## 2020-12-13 DIAGNOSIS — Z7409 Other reduced mobility: Secondary | ICD-10-CM | POA: Diagnosis not present

## 2020-12-13 DIAGNOSIS — M25551 Pain in right hip: Secondary | ICD-10-CM | POA: Diagnosis not present

## 2020-12-13 DIAGNOSIS — R262 Difficulty in walking, not elsewhere classified: Secondary | ICD-10-CM | POA: Diagnosis not present

## 2020-12-13 DIAGNOSIS — M545 Low back pain, unspecified: Secondary | ICD-10-CM | POA: Diagnosis not present

## 2020-12-15 DIAGNOSIS — Z981 Arthrodesis status: Secondary | ICD-10-CM | POA: Diagnosis not present

## 2020-12-15 DIAGNOSIS — Z789 Other specified health status: Secondary | ICD-10-CM | POA: Diagnosis not present

## 2020-12-15 DIAGNOSIS — G8929 Other chronic pain: Secondary | ICD-10-CM | POA: Diagnosis not present

## 2020-12-15 DIAGNOSIS — R262 Difficulty in walking, not elsewhere classified: Secondary | ICD-10-CM | POA: Diagnosis not present

## 2020-12-15 DIAGNOSIS — Z7409 Other reduced mobility: Secondary | ICD-10-CM | POA: Diagnosis not present

## 2020-12-15 DIAGNOSIS — M25551 Pain in right hip: Secondary | ICD-10-CM | POA: Diagnosis not present

## 2020-12-15 DIAGNOSIS — R29898 Other symptoms and signs involving the musculoskeletal system: Secondary | ICD-10-CM | POA: Diagnosis not present

## 2020-12-15 DIAGNOSIS — M545 Low back pain, unspecified: Secondary | ICD-10-CM | POA: Diagnosis not present

## 2020-12-23 ENCOUNTER — Ambulatory Visit
Admission: RE | Admit: 2020-12-23 | Discharge: 2020-12-23 | Disposition: A | Discharge status: Home / Self Care | Payer: Medicare Other | Source: Ambulatory Visit | Attending: Family Medicine | Admitting: Family Medicine

## 2020-12-23 DIAGNOSIS — R102 Pelvic and perineal pain: Secondary | ICD-10-CM

## 2020-12-23 DIAGNOSIS — S32501A Unspecified fracture of right pubis, initial encounter for closed fracture: Secondary | ICD-10-CM | POA: Diagnosis not present

## 2020-12-23 DIAGNOSIS — K5750 Diverticulosis of both small and large intestine without perforation or abscess without bleeding: Secondary | ICD-10-CM | POA: Diagnosis not present

## 2020-12-23 DIAGNOSIS — N281 Cyst of kidney, acquired: Secondary | ICD-10-CM | POA: Diagnosis not present

## 2020-12-23 DIAGNOSIS — M4317 Spondylolisthesis, lumbosacral region: Secondary | ICD-10-CM | POA: Diagnosis not present

## 2020-12-23 MED ORDER — IOPAMIDOL (ISOVUE-300) INJECTION 61%
60.0000 mL | Freq: Once | INTRAVENOUS | Status: AC | PRN
  Administered 2020-12-23: 60 mL via INTRAVENOUS

## 2020-12-26 ENCOUNTER — Other Ambulatory Visit: Payer: Self-pay | Admitting: Family Medicine

## 2020-12-26 DIAGNOSIS — R102 Pelvic and perineal pain: Secondary | ICD-10-CM

## 2020-12-27 ENCOUNTER — Other Ambulatory Visit (INDEPENDENT_AMBULATORY_CARE_PROVIDER_SITE_OTHER): Payer: Self-pay

## 2020-12-27 ENCOUNTER — Other Ambulatory Visit: Payer: Self-pay | Admitting: Neurology

## 2020-12-27 ENCOUNTER — Telehealth: Payer: Self-pay | Admitting: Neurology

## 2020-12-27 DIAGNOSIS — M316 Other giant cell arteritis: Secondary | ICD-10-CM | POA: Diagnosis not present

## 2020-12-27 DIAGNOSIS — Z0289 Encounter for other administrative examinations: Secondary | ICD-10-CM

## 2020-12-27 DIAGNOSIS — H04123 Dry eye syndrome of bilateral lacrimal glands: Secondary | ICD-10-CM | POA: Diagnosis not present

## 2020-12-27 DIAGNOSIS — R519 Headache, unspecified: Secondary | ICD-10-CM | POA: Diagnosis not present

## 2020-12-27 MED ORDER — PREDNISONE 20 MG PO TABS: 60.0000 mg | ORAL_TABLET | Freq: Every day | ORAL | 0 refills | Status: DC

## 2020-12-27 NOTE — Telephone Encounter (Signed)
[name], Please call and see if Dr. [name] or an NP has an opening soon

## 2020-12-27 NOTE — Telephone Encounter (Signed)
Reached patient by phone.  I was able to work patient into Sara's schedule on Thursday 12/29/20 and cancelled her appointment she had originally on 4/14.  Patient is coming in today for labs.  She is aware of the steroids being called in and must have labs completed prior to starting the steroids.   Patient denied further questions, verbalized understanding and expressed appreciation for the phone call.

## 2020-12-27 NOTE — Telephone Encounter (Signed)
Dr. Marygrace Drought call today from ophthalmology, this is a patient who has a history of giant cell arteritis and vision loss in the right eye complaining of new onset left temporal headache and dimming of her vision in the left eye. VA was 20/30, optic nerve looks normal, Dr. Satira Sark concerned for GCA left.  She is on 4 mg a day of steroids, I will have her come in and get an ESR CRP, may also consider increasing her steroids back up to 41m daily at least until the ESR and CRP values come back or she sees Dr. WJannifer Franklin will see if Dr. WJannifer Franklinhas an opening next week or if one of the NP's has an opening, I can also look at my schedule for this week.  Bethany give patient a call and tell her to come into the office as soon as possible to get the ESR and CRP and that we may consider increasing her steroids at least temporarily.  We do not want her to lose vision in the left eye as well.

## 2020-12-28 ENCOUNTER — Telehealth: Payer: Self-pay | Admitting: *Deleted

## 2020-12-28 DIAGNOSIS — M0589 Other rheumatoid arthritis with rheumatoid factor of multiple sites: Secondary | ICD-10-CM | POA: Diagnosis not present

## 2020-12-28 LAB — C-REACTIVE PROTEIN: CRP: 7 mg/L (ref 0–10)

## 2020-12-28 LAB — SEDIMENTATION RATE: Sed Rate: 15 mm/hr (ref 0–40)

## 2020-12-28 NOTE — Progress Notes (Signed)
PATIENT: [name] DOB: [birth year]  REASON FOR VISIT: follow up HISTORY FROM: patient  HISTORY OF PRESENT ILLNESS: Today 12/29/20 [name] is an 80 year old female with history of right temporal arteritis around 2009, rheumatoid arthritis, not able to get any lower than 4 mg daily of prednisone.  [name] is seen on an urgent basis today complaining of new onset left temporal headache and dimming of the vision in the left eye.  She was seen by ophthalmology Dr. Satira Sark earlier this week, concern for left giant cell arteritis.  At the time, was taking 4 mg of prednisone daily.  She came in 12/26/20 for blood work, ESR (15) (and CRP (7) were normal. As precaution, Dr. Jaynee Eagles the on call doctor immediately ordered prednisone 60 mg daily 2 days ago (12/26/20).   Here today alone, 1 month left sided headache behind the eye, radiates posterior. Not every day, usually at night, takes OTC generic "headache tablet", then goes to bed. Next morning feeling fairly well. Sometimes bothersome during day but not much. 3 days ago woke up left vision was blurry, had to go to [place] to take taxes. She was driving, vision wasn't as bright, or as clear, that morning felt nauseated/vomited once. Mild headache. Spent day in [place], that night was reading, felt the words were coming together, stopped reading. Got worried, due to history of right temporal arteritis that is now blind. Next day called Dr. Satira Sark office, told eye anatomy was okay, concern for left temporal arteritis. Was already on prednisone 4 mg daily. She started prednisone 60 mg last night. Today she feels tired, more than normal, no fevers, no jaw pain, feels the left vision is somewhat better, isn't exactly normal, she feels things aren't as bright, isn't blurry. Now has frontal headache 4/10. Can't think of any reason for her to be tired. She lives alone. Did have a fall 2 months ago to right hip, had CT scan showed, some swelling. Does feel her  balance hasn't been as good.  Here today for evaluation unaccompanied, she drove here.  HISTORY 01/05/2020 Dr. [name]: [name] is a 80 year old right-handed white female with a history of temporal arteritis, she also has rheumatoid arthritis, she has not been able to get lower than 4 mg daily of the prednisone.  The patient had her fourth lumbosacral spine surgery on 14 September 2019, she has actually done quite well since this without any pain in the back, she still uses a walker for ambulation.  She has a left foot drop related to a prior L5 radiculopathy.  She has not had any falls.  She is not having any neck pain, she has had 2 prior neck surgeries.  She does have chronic insomnia, she reports some edema of the left ankle and foot, she has significant arthritis affecting the foot.  The patient is followed through rheumatology with Dr. Amil Amen, she gets IV medications for her rheumatoid arthritis.  The patient now is in physical therapy following the back surgery.  She will have occasional jerking of the legs at night, she will take tizanidine for this.  REVIEW OF SYSTEMS: Out of a complete 14 system review of symptoms, the patient complains only of the following symptoms, and all other reviewed systems are negative.  See HPI  ALLERGIES: Allergies  Allergen Reactions  . Hydromorphone Other (See Comments)    Cognitive changes  "made me unconscious"    HOME MEDICATIONS: Outpatient Medications Prior to Visit  Medication Sig Dispense Refill  .  amLODipine (NORVASC) 5 MG tablet TAKE 1 TABLET DAILY 90 tablet 3  . Calcium Carb-Cholecalciferol (CALCIUM CARBONATE-VITAMIN D3 PO) Take 1 tablet by mouth daily    . cholecalciferol (VITAMIN D) 1000 UNITS tablet Take 1,000 Units by mouth daily.    Marland Kitchen denosumab (PROLIA) 60 MG/ML SOLN injection Inject 60 mg into the skin every 6 (six) months.    . diclofenac sodium (VOLTAREN) 1 % GEL APPLY 2 GRAMS TOPICALLY FOUR TIMES A DAY 400 g 6  . ferrous sulfate 325  (65 FE) MG tablet Take 325 mg by mouth 2 (two) times daily.    . furosemide (LASIX) 40 MG tablet TAKE 1 TABLET DAILY 90 tablet 3  . gabapentin (NEURONTIN) 300 MG capsule Take 2 capsules (600 mg total) by mouth 2 (two) times daily. 360 capsule 3  . Golimumab ([place] ARIA IV) Inject into the vein. Takes infusion every 8 wks.    . Multiple Vitamins-Minerals (MULTIVITAMIN,TX-MINERALS) tablet Take 1 tablet by mouth daily.    Marland Kitchen oxyCODONE (OXYCONTIN) 10 mg 12 hr tablet Take one tablet by mouth every 12 hours.  May refill in one month 60 tablet 0  . oxyCODONE (OXYCONTIN) 10 mg 12 hr tablet Take 1 tablet by mouth every 12 hours.  May refill in 2 months 60 tablet 0  . oxyCODONE (OXYCONTIN) 10 mg 12 hr tablet Take 1 tablet (10 mg total) by mouth every 12 (twelve) hours. 60 tablet 0  . Oxycodone HCl 10 MG TABS Take 1 tablet (10 mg total) by mouth every 6 (six) hours as needed. Take 1 tablet by mouth every 6 hours as needed for breakthrough pain 90 tablet 0  . potassium chloride (KLOR-CON) 10 MEQ tablet TAKE 1 TABLET TWICE A DAY 180 tablet 0  . predniSONE (DELTASONE) 1 MG tablet TAKE 4 TABLETS DAILY WITH BREAKFAST 360 tablet 3  . predniSONE (DELTASONE) 20 MG tablet Take 3 tablets (60 mg total) by mouth daily. 30 tablet 0  . simvastatin (ZOCOR) 5 MG tablet TAKE 1 TABLET AT BEDTIME 90 tablet 3  . tizanidine (ZANAFLEX) 2 MG capsule Take 1 capsule (2 mg total) by mouth as needed for muscle spasms (take 1 at bedtime as needed for muscle spasms). 30 capsule 3  . lidocaine (LIDODERM) 5 % 1 patch daily.     No facility-administered medications prior to visit.    PAST MEDICAL HISTORY: Past Medical History:  Diagnosis Date  . Allergic rhinitis   . Anemia   . Anxiety   . Bronchiectasis   . Carpal tunnel syndrome 07/13/2015   Bilateral  . CVA (cerebral infarction) 10/2007   Right thalamic   . Diverticulosis of colon   . Gait disorder   . GERD (gastroesophageal reflux disease)   . History of breast cancer    . HTN (hypertension)   . Hyperlipidemia   . Idiopathic pulmonary fibrosis   . Left knee DJD   . Migraine   . Osteoporosis   . Peripheral neuropathy   . PMR (polymyalgia rheumatica) (HCC)   . Polyneuropathy in other diseases classified elsewhere ([place]) 02/17/2013  . Previous back surgery 10/09/12  . Renal insufficiency   . Rheumatoid arthritis ([place])   . Seizure disorder ([place])   . Spondylosis, cervical, with myelopathy 08/31/2015   C3-4 myelopathy  . Temporal arteritis (HCC)    Right eye blind, on steroids per [facility]/ Dr [name]  . Type II or unspecified type diabetes mellitus without mention of complication, not stated as uncontrolled    2nd  to steriods  . Vitamin D deficiency     PAST SURGICAL HISTORY: Past Surgical History:  Procedure Laterality Date  . CARPAL TUNNEL RELEASE Right   . CATARACT EXTRACTION     OS - Summer 2010  . CERVICAL FUSION  2010   4 rods and pins in place  . CHOLECYSTECTOMY    . COLONOSCOPY  12/15/2011   Procedure: COLONOSCOPY;  Surgeon: [name], MD;  Location: [facility];  Service: Endoscopy;  Laterality: N/A;  . LUMBAR FUSION  04/2010   W/Mechanical fixation - [facility]  2013   rods in hips (to stabilize).  Marland Kitchen MASTECTOMY    . NECK SURGERY    . rheumatoid nodule removal    . SPINAL FUSION  07/2010   T10-L2 interbody fusion / [facility]  . TONSILLECTOMY    . TOTAL KNEE ARTHROPLASTY     Left    FAMILY HISTORY: Family History  Problem Relation Age of Onset  . Brain cancer Mother   . Hypertension Mother   . Arthritis Mother   . Dementia Father   . Hypertension Father   . Arthritis Father   . Breast cancer Sister   . Lung cancer Sister   . Lung disease Neg Hx   . Rheumatologic disease Neg Hx     SOCIAL HISTORY: Social History   Socioeconomic History  . Marital status: Widowed    Spouse name: Not on file  . Number of children: 1  . Years of education: 12+ coll.  . Highest education level: Not on  file  Occupational History  . Occupation: Education officer, environmental: RETIRED    Comment: retired  Tobacco Use  . Smoking status: Never Smoker  . Smokeless tobacco: Never Used  . Tobacco comment: Father   Vaping Use  . Vaping Use: Never used  Substance and Sexual Activity  . Alcohol use: No    Alcohol/week: 0.0 standard drinks    Comment: heavy drinker until 1995 - sobriety with AA  . Drug use: No  . Sexual activity: Not Currently  Other Topics Concern  . Not on file  Social History Narrative      Married - husband invalid with stroke - widowed in 2009. 1 chil. 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 ACP - not formerly discussed - wishes to be a full code.    Patient is right handed. Patient consumes tea 4  times daily.         Patient drinks about 4-5 cups of caffeine daily.   Patient is right handed.       [facility] (04/25/17):   Originally from [place], [place]. Previously lived in [place]. 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 No bird or mold exposure. No hot tub exposure. Has traveled to [place], [place], and [place] No pets currently. Enjoys going to the beach.       Social Determinants of Health   Financial Resource Strain: Not on file  Food Insecurity: Not on file  Transportation Needs: Not on file  Physical Activity: Not on file  Stress: Not on file  Social Connections: Moderately Integrated  . Frequency of Communication with Friends and Family: More than three times a week  . Frequency of Social Gatherings with Friends and Family: Once a week  . Attends Religious Services: 1 to 4 times per year  .  Active Member of Clubs or Organizations: Yes  . Attends Archivist Meetings: More than 4 times per year  . Marital Status: Widowed  Intimate Partner Violence: Not on file   PHYSICAL EXAM  Vitals:   12/29/20 1255  BP: (!)  142/85  Pulse: 68  Weight: 137 lb (62.1 kg)  Height: 5' 1"  (1.549 m)   Body mass index is 25.89 kg/m.  Generalized: Well developed, in no acute distress  Neurological examination  Mentation: Alert oriented to time, place, history taking. Follows all commands speech and language fluent Cranial nerve II-XII: Pupils were equal round reactive to light. Extraocular movements were full, visual field were full on confrontational test. Facial sensation and strength were normal.  No left-sided temple tenderness.  No jaw pain.  Head turning and shoulder shrug  were normal and symmetric. Motor: Good strength all extremities, no weakness noted Sensory: Sensory testing is intact to soft touch on all 4 extremities. No evidence of extinction is noted.  Coordination: Cerebellar testing reveals good finger-nose-finger and heel-to-shin bilaterally.  Gait and station: Kyphosis with standing, can walk independently, gait is wide-based, uses a walker in the hallway Reflexes: Deep tendon reflexes are symmetric and normal bilaterally  DIAGNOSTIC DATA (LABS, IMAGING, TESTING) - I reviewed patient records, labs, notes, testing and imaging myself where available.  Lab Results  Component Value Date   WBC 7.6 09/14/2020   HGB 13.5 09/14/2020   HCT 40.9 09/14/2020   MCV 98.8 09/14/2020   PLT 232 09/14/2020      Component Value Date/Time   NA 140 10/18/2020 1142   NA 143 06/25/2018 0000   K 4.6 10/18/2020 1142   CL 101 10/18/2020 1142   CO2 34 (H) 10/18/2020 1142   GLUCOSE 100 (H) 10/18/2020 1142   GLUCOSE 132 (H) 10/01/2006 1605   BUN 24 (H) 10/18/2020 1142   BUN 17 06/25/2018 0000   CREATININE 1.19 10/18/2020 1142   CALCIUM 10.0 10/18/2020 1142   PROT 7.4 10/18/2020 1142   ALBUMIN 4.1 10/18/2020 1142   AST 28 10/18/2020 1142   ALT 22 10/18/2020 1142   ALKPHOS 69 10/18/2020 1142   BILITOT 0.8 10/18/2020 1142   GFRNONAA 47 (L) 09/14/2020 1526   GFRAA 40 (L) 09/04/2013 1559   Lab Results   Component Value Date   CHOL 144 10/18/2020   HDL 62.50 10/18/2020   LDLCALC 63 10/18/2020   TRIG 92.0 10/18/2020   CHOLHDL 2 10/18/2020   Lab Results  Component Value Date   HGBA1C 5.3 10/30/2017   Lab Results  Component Value Date   VITAMINB12 508 03/16/2020   Lab Results  Component Value Date   TSH 2.13 01/08/2020      ASSESSMENT AND PLAN 80 y.o. year old female  has a past medical history of Allergic rhinitis, Anemia, Anxiety, Bronchiectasis, Carpal tunnel syndrome (07/13/2015), CVA (cerebral infarction) (10/2007), Diverticulosis of colon, Gait disorder, GERD (gastroesophageal reflux disease), History of breast cancer, HTN (hypertension), Hyperlipidemia, Idiopathic pulmonary fibrosis, Left knee DJD, Migraine, Osteoporosis, Peripheral neuropathy, PMR (polymyalgia rheumatica) ([place]), Polyneuropathy in other diseases classified elsewhere ([place]) (02/17/2013), Previous back surgery (10/09/12), Renal insufficiency, Rheumatoid arthritis ([place]), Seizure disorder ([place]), Spondylosis, cervical, with myelopathy (08/31/2015), Temporal arteritis ([place]), Type II or unspecified type diabetes mellitus without mention of complication, not stated as uncontrolled, and Vitamin D deficiency. here with:  1.  Right temporal arteritis, chronic 2.  Concern for left-sided temporal arteritis (left temporal headache, vision dimming)  -ESR (15), CRP (7) were normal 12/26/20 -1  month history of intermittent left-sided headache, retro-orbital, left temple; 3 days ago visual disturbance of blurry vision, now feels things are not as bright, in general feels tired, has headache -Order MRI of the brain with and without contrast, will check creatinine today -Send for urgent left temporal artery biopsy with general surgery, given history of right temporal arteritis that resulted in right sided blindness -Continue high-dose steroids 60 mg daily until biopsy results -ESR, CRP were normal, but was taking 4 mg daily prednisone  at the time, unclear how significantly this would skew the labs -Will hold slot for revisit next week 4/12 with Dr. [name], if he feels indicated -We contacted her son with the plan, he is a doctor, she lives alone -I reviewed plan with Dr. Jaynee Eagles , Dr. [name] is out of the office  I spent 30 minutes of face-to-face and non-face-to-face time with patient. This included previsit chart review, lab review, study review, order entry, electronic health record documentation, patient education.  Butler Denmark, AGNP-C, DNP 12/29/2020, 1:38 PM [facility] [address] [place], [place] [address] [phone]

## 2020-12-28 NOTE — Telephone Encounter (Addendum)
I tried to call the patient twice in a row but it went straight to voicemail. I called [facility]. Pt has not picked up the Prednisone 20 mg tablet rx yet. I asked them to try to reach pt as well about picking it up and asked if they would not mind telling pt to call our office if they can reach her.   Tried to call pt once more and LVM (ok per DPR) advising of lab results and that she needs to pick up the higher dose of Prednisone from the pharmacy and take 60 mg daily. Left office number in message and asked for call back asap.   I was able to reach the patient's son [name] (on [place]) and I explained to him the situation and the lab results, the importance of patient starting on the high dose of steroids to avoid vision loss and following up in the office.  He verbalized understanding and will try to reach the patient immediately.  If he can reach her, he is going to have her start the 60 mg today and then wait to take tomorrow's dose until after she sees Sarah at 1:15 so the doses are not too close together. He verbalized appreciation for the call.

## 2020-12-28 NOTE — Telephone Encounter (Signed)
Both the sedimentation rate and C-reactive protein were well within normal limits, it certainly would be unusual for temporal arteritis to reactivate years and years and years after initial presentation.  If no ophthalmologic explanation is found, MRI of the brain may be indicated.

## 2020-12-28 NOTE — Telephone Encounter (Signed)
That's fine, we will see her tomorrow. Judson Roch can come talk to me during the appointment and I can step in if needed thanks

## 2020-12-28 NOTE — Telephone Encounter (Signed)
I called the pt and LVM (ok per DPR) advising her of normal ESR and CRP however per Dr Jaynee Eagles, pt is on the low dose steroids. She needs to continue the high-dose steroids, Prednisone 60 mg daily, until she sees Dr [name]. We do have her scheduled with Judson Roch tomorrow 4/7 and she should keep that appt. We also are going to tentatively schedule pt to see Dr [name] next week. I have asked the pt to call us back. Left office number in message. For visit with Dr [name], he has next Tuesday 4/12 at 2:45 pm available.

## 2020-12-28 NOTE — Telephone Encounter (Signed)
Tried to call patient 1 more time this evening and her phone went straight to voicemail again.  Will try tomorrow morning.

## 2020-12-28 NOTE — Telephone Encounter (Signed)
-----  Message from Melvenia Beam, MD sent at 12/28/2020  8:45 AM EDT ----- [name]: Esr/crp negative however she is on low dose prednisone. I would continue the high dose steroids until she sees Dr. [name]. Thanks (FYI Dr. [name] and Dr. Satira Sark)

## 2020-12-29 ENCOUNTER — Ambulatory Visit (INDEPENDENT_AMBULATORY_CARE_PROVIDER_SITE_OTHER): Payer: Medicare Other | Admitting: Neurology

## 2020-12-29 ENCOUNTER — Telehealth: Payer: Self-pay | Admitting: Neurology

## 2020-12-29 ENCOUNTER — Encounter: Payer: Self-pay | Admitting: Neurology

## 2020-12-29 VITALS — BP 142/85 | HR 68 | Ht 61.0 in | Wt 137.0 lb

## 2020-12-29 DIAGNOSIS — M316 Other giant cell arteritis: Secondary | ICD-10-CM | POA: Diagnosis not present

## 2020-12-29 DIAGNOSIS — H5310 Unspecified subjective visual disturbances: Secondary | ICD-10-CM | POA: Diagnosis not present

## 2020-12-29 NOTE — Patient Instructions (Addendum)
Continue taking the high dose prednisone 60 mg daily until we get results back Set up for temporal artery biopsy on the left  Order mri of the brain  Check kidney function  Holding appointment for 4/12 with Dr. [name]

## 2020-12-29 NOTE — Telephone Encounter (Signed)
Medicare/tricare order sent to GI. No auth they will reach out to the patient to schedule.  

## 2020-12-29 NOTE — Progress Notes (Signed)
I have read the note, and I agree with the clinical assessment and plan.  Ronita Hargreaves K Kalid Ghan   

## 2020-12-29 NOTE — Telephone Encounter (Signed)
Patient is scheduled for 12/30/2020 with Dr. Kieth Brightly at 3:40 I have talked to patient and went over all details and instructions . Thanks Hinton Dyer

## 2020-12-29 NOTE — Telephone Encounter (Signed)
[name] I ordered urgent left temporal artery biopsy on this patient can you call over to general surgery and get set up?

## 2020-12-30 DIAGNOSIS — M316 Other giant cell arteritis: Secondary | ICD-10-CM | POA: Diagnosis not present

## 2020-12-30 LAB — CREATININE, SERUM
Creatinine, Ser: 1.31 mg/dL — ABNORMAL HIGH (ref 0.57–1.00)
eGFR: 41 mL/min/{1.73_m2} — ABNORMAL LOW (ref >59)

## 2021-01-02 ENCOUNTER — Telehealth: Payer: Self-pay | Admitting: Neurology

## 2021-01-02 NOTE — Telephone Encounter (Signed)
Creatinine mildly elevated 1.31 GFR 41, should be ok for MRI with contrast, make sure she drinks enough water before and after MRI. Keep appointment with Dr. [name] tomorrow, was scheduled for temporal artery biopsy last Friday.

## 2021-01-02 NOTE — Telephone Encounter (Signed)
Called pt and relayed that creatinine level mildly elevated. [place] for MRI with contrast. She has had MRI previously tolerated ok.   Has appt tomorrow with Dr. [name] at [phone].  Did not have biopsy waiting to see Dr. [name].  Will see [place] tomorrow.

## 2021-01-02 NOTE — Telephone Encounter (Signed)
Called on 12/29/2020 for apt. Patient was scheduled . 12/30/2020. Patient was aware of details on Thursday 12/29/2020 .

## 2021-01-03 ENCOUNTER — Encounter: Payer: Self-pay | Admitting: Neurology

## 2021-01-03 ENCOUNTER — Other Ambulatory Visit: Payer: Self-pay

## 2021-01-03 ENCOUNTER — Ambulatory Visit: Payer: TRICARE For Life (TFL) | Admitting: Orthopaedic Surgery

## 2021-01-03 ENCOUNTER — Ambulatory Visit (INDEPENDENT_AMBULATORY_CARE_PROVIDER_SITE_OTHER): Payer: Medicare Other | Admitting: Neurology

## 2021-01-03 VITALS — BP 138/76 | HR 71 | Ht 61.0 in | Wt 138.0 lb

## 2021-01-03 DIAGNOSIS — G4489 Other headache syndrome: Secondary | ICD-10-CM

## 2021-01-03 DIAGNOSIS — M316 Other giant cell arteritis: Secondary | ICD-10-CM | POA: Diagnosis not present

## 2021-01-03 MED ORDER — PREDNISONE 10 MG PO TABS: ORAL_TABLET | ORAL | 2 refills | Status: DC

## 2021-01-03 NOTE — Progress Notes (Signed)
Reason for visit: Headache, temporal arteritis  CECILLIA MENEES is an 80 y.o. female  History of present illness:  Ms. Demeritt is an 80 year old right-handed white female with a history of temporal arteritis with visual loss on the right eye.  She also has rheumatoid arthritis and has been treated with low-dose prednisone of 4 mg daily.  The patient has had lumbosacral spine surgery on several occasions, she has a chronic left foot drop secondary to a prior L5 radiculopathy.  She does report some chronic insomnia.  The patient also has a history of cervical myelopathy and peripheral neuropathy.  Beginning around the third week in March 2022, the patient began having a left frontotemporal headache.  The patient reported some mild vision changes, she has been seen by ophthalmology in this has not shown any significant abnormalities.  The patient also felt some alteration in balance.  She uses a walker for ambulation, she has not had any recent falls but she did fall 2 months ago and claims that she did strike the back of the head with the fall.  She has been taking over-the-counter medications for the headache.  She was seen through this office on 29 December 2020, she was started on 60 mg of prednisone which seemed to help the headache but not eliminate the headache.  She still has some left frontotemporal headache events.  She has no overt new numbness or weakness of extremities.  The visual acuity is at baseline currently.  She returns the office today for an evaluation.  MRI of the brain has been ordered but not yet done.  Blood work showed a sedimentation rate of 15.  Past Medical History:  Diagnosis Date  . Allergic rhinitis   . Anemia   . Anxiety   . Bronchiectasis   . Carpal tunnel syndrome 07/13/2015   Bilateral  . CVA (cerebral infarction) 10/2007   Right thalamic   . Diverticulosis of colon   . Gait disorder   . GERD (gastroesophageal reflux disease)   . History of breast cancer   .  HTN (hypertension)   . Hyperlipidemia   . Idiopathic pulmonary fibrosis   . Left knee DJD   . Migraine   . Osteoporosis   . Peripheral neuropathy   . PMR (polymyalgia rheumatica) (HCC)   . Polyneuropathy in other diseases classified elsewhere ([place]) 02/17/2013  . Previous back surgery 10/09/12  . Renal insufficiency   . Rheumatoid arthritis ([place])   . Seizure disorder ([place])   . Spondylosis, cervical, with myelopathy 08/31/2015   C3-4 myelopathy  . Temporal arteritis (HCC)    Right eye blind, on steroids per [facility]/ Dr [name]  . Type II or unspecified type diabetes mellitus without mention of complication, not stated as uncontrolled    2nd to steriods  . Vitamin D deficiency     Past Surgical History:  Procedure Laterality Date  . CARPAL TUNNEL RELEASE Right   . CATARACT EXTRACTION     OS - Summer 2010  . CERVICAL FUSION  2010   4 rods and pins in place  . CHOLECYSTECTOMY    . COLONOSCOPY  12/15/2011   Procedure: COLONOSCOPY;  Surgeon: [name], MD;  Location: [facility];  Service: Endoscopy;  Laterality: N/A;  . LUMBAR FUSION  04/2010   W/Mechanical fixation - [facility]  2013   rods in hips (to stabilize).  [facility] MASTECTOMY    . NECK SURGERY    . rheumatoid  nodule removal    . SPINAL FUSION  07/2010   T10-L2 interbody fusion / [facility]  . TONSILLECTOMY    . TOTAL KNEE ARTHROPLASTY     Left    Family History  Problem Relation Age of Onset  . Brain cancer Mother   . Hypertension Mother   . Arthritis Mother   . Dementia Father   . Hypertension Father   . Arthritis Father   . Breast cancer Sister   . Lung cancer Sister   . Lung disease Neg Hx   . Rheumatologic disease Neg Hx     Social history:  reports that she has never smoked. She has never used smokeless tobacco. She reports that she does not drink alcohol and does not use drugs.    Allergies  Allergen Reactions  . Hydromorphone Other (See Comments)    Cognitive changes   "made me unconscious"    Medications:  Prior to Admission medications   Medication Sig Start Date End Date Taking? Authorizing Provider  amLODipine (NORVASC) 5 MG tablet TAKE 1 TABLET DAILY 02/26/20  Yes Burchette, Alinda Sierras, MD  Calcium Carb-Cholecalciferol (CALCIUM CARBONATE-VITAMIN D3 PO) Take 1 tablet by mouth daily   Yes [provider]  cholecalciferol (VITAMIN D) 1000 UNITS tablet Take 1,000 Units by mouth daily.   Yes [provider]  denosumab (PROLIA) 60 MG/ML SOLN injection Inject 60 mg into the skin every 6 (six) months.   Yes [provider]  diclofenac sodium (VOLTAREN) 1 % GEL APPLY 2 GRAMS TOPICALLY FOUR TIMES A DAY 05/12/19  Yes Kathrynn Ducking, MD  ferrous sulfate 325 (65 FE) MG tablet Take 325 mg by mouth 2 (two) times daily.   Yes [provider]  furosemide (LASIX) 40 MG tablet TAKE 1 TABLET DAILY 10/17/20  Yes Burchette, Alinda Sierras, MD  gabapentin (NEURONTIN) 300 MG capsule Take 2 capsules (600 mg total) by mouth 2 (two) times daily. 12/01/19  Yes Kathrynn Ducking, MD  Golimumab [facility] ARIA IV) Inject into the vein. Takes infusion every 8 wks.   Yes [provider]  Multiple Vitamins-Minerals (MULTIVITAMIN,TX-MINERALS) tablet Take 1 tablet by mouth daily.   Yes [provider]  oxyCODONE (OXYCONTIN) 10 mg 12 hr tablet Take one tablet by mouth every 12 hours.  May refill in one month 10/18/20  Yes Burchette, Alinda Sierras, MD  oxyCODONE (OXYCONTIN) 10 mg 12 hr tablet Take 1 tablet by mouth every 12 hours.  May refill in 2 months 10/18/20  Yes Burchette, Alinda Sierras, MD  oxyCODONE (OXYCONTIN) 10 mg 12 hr tablet Take 1 tablet (10 mg total) by mouth every 12 (twelve) hours. 10/18/20  Yes Burchette, Alinda Sierras, MD  Oxycodone HCl 10 MG TABS Take 1 tablet (10 mg total) by mouth every 6 (six) hours as needed. Take 1 tablet by mouth every 6 hours as needed for breakthrough pain 10/18/20  Yes Burchette, Alinda Sierras, MD  potassium chloride (KLOR-CON) 10 MEQ  tablet TAKE 1 TABLET TWICE A DAY 11/22/20  Yes Burchette, Alinda Sierras, MD  predniSONE (DELTASONE) 1 MG tablet TAKE 4 TABLETS DAILY WITH BREAKFAST 03/23/20  Yes Kathrynn Ducking, MD  predniSONE (DELTASONE) 20 MG tablet Take 3 tablets (60 mg total) by mouth daily. 12/27/20  Yes Melvenia Beam, MD  simvastatin (ZOCOR) 5 MG tablet TAKE 1 TABLET AT BEDTIME 03/23/20  Yes Burchette, Alinda Sierras, MD  tizanidine (ZANAFLEX) 2 MG capsule Take 1 capsule (2 mg total) by mouth as needed for muscle  spasms (take 1 at bedtime as needed for muscle spasms). 01/05/20  Yes Kathrynn Ducking, MD  potassium chloride (K-DUR) 10 MEQ tablet Take 1 tablet (10 mEq total) by mouth 2 (two) times daily. 12/29/12 07/13/20  Norins, Heinz Knuckles, MD    ROS:  Out of a complete 14 system review of symptoms, the patient complains only of the following symptoms, and all other reviewed systems are negative.  Headache Vision change Walking difficulty  Blood pressure 138/76, pulse 71, height 5\' 1"  (1.549 m), weight 138 lb (62.6 kg).  Physical Exam  General: The patient is alert and cooperative at the time of the examination.  Skin: No significant peripheral edema is noted.   Neurologic Exam  Mental status: The patient is alert and oriented x 3 at the time of the examination. The patient has apparent normal recent and remote memory, with an apparently normal attention span and concentration ability.   Cranial nerves: Facial symmetry is present. Speech is normal, no aphasia or dysarthria is noted. Extraocular movements are full. Visual fields are full with the left eye, the right eye is essentially blind.  Motor: The patient has good strength in all 4 extremities, with exception of a prominent left foot drop.  Sensory examination: Soft touch sensation is symmetric on the face, arms, and legs.  Coordination: The patient has good finger-nose-finger and heel-to-shin bilaterally.  Gait and station: The patient has a significant pelvic tilt  with the left side being lower than the right with walking.  The patient uses a walker for ambulation.  She can walk independently but has a wide-based gait.  Reflexes: Deep tendon reflexes are symmetric.   Assessment/Plan:  1.  Left frontotemporal headache, new onset  2.  History of temporal arteritis  The patient has not had full elimination of her headaches with the use of prednisone.  She has had pain mainly in the frontotemporal area on the left.  The patient will undergo MRI of the brain, I will check a carotid Doppler study to exclude left carotid artery disease.  She will begin a taper off of the prednisone by 10 mg each week, trying to get her back to her baseline prednisone dosing of 4 mg daily.  If the headache worsens with the taper, the patient will call me.  She will otherwise follow-up in about 3 months.  Jill Alexanders MD 01/03/2021 2:31 PM  [facility] [address] [place], [place] [address]  Phone [phone] Fax [phone]

## 2021-01-05 ENCOUNTER — Ambulatory Visit: Payer: Medicare Other | Admitting: Neurology

## 2021-01-07 ENCOUNTER — Ambulatory Visit
Admission: RE | Admit: 2021-01-07 | Discharge: 2021-01-07 | Disposition: A | Discharge status: Home / Self Care | Payer: Medicare Other | Source: Ambulatory Visit | Attending: Neurology | Admitting: Neurology

## 2021-01-07 ENCOUNTER — Other Ambulatory Visit: Payer: Self-pay

## 2021-01-07 DIAGNOSIS — M316 Other giant cell arteritis: Secondary | ICD-10-CM

## 2021-01-07 MED ORDER — GADOBENATE DIMEGLUMINE 529 MG/ML IV SOLN
13.0000 mL | Freq: Once | INTRAVENOUS | Status: AC | PRN
  Administered 2021-01-07: 13 mL via INTRAVENOUS

## 2021-01-10 ENCOUNTER — Telehealth: Payer: Self-pay | Admitting: Neurology

## 2021-01-10 NOTE — Telephone Encounter (Signed)
Pt called, checking status of MRI results. Would like a call from the nurse.

## 2021-01-11 NOTE — Telephone Encounter (Signed)
LVM requesting call back.

## 2021-01-16 ENCOUNTER — Encounter: Payer: Self-pay | Admitting: *Deleted

## 2021-01-16 ENCOUNTER — Telehealth: Payer: Self-pay | Admitting: Neurology

## 2021-01-16 NOTE — Telephone Encounter (Signed)
I called the patient.  The MRI of the brain does show a moderate level of small vessel disease.  This has progressed some since 2014.  The patient is coming down slowly on the prednisone, she still has her headaches but they are somewhat better than it had been, she will be getting a carotid Doppler study next week.   MRI brain 01/07/21:  IMPRESSION: This MRI of the brain with and without contrast shows the following: 1.   Normal enhancement pattern.  No acute findings. 2.   Mild generalized cortical atrophy, progressed compared to the 2014 MRI. 3.   Scattered T2/FLAIR hyperintense foci in the hemispheres and pons consistent with moderate chronic microvascular ischemic change, progressed compared to the 2014 MRI. 4.   2 chronic microhemorrhages in the right parietal lobe, unchanged compared to the 2014 MRI. 5.   Normal enhancement pattern.  No acute findings.

## 2021-01-16 NOTE — Telephone Encounter (Signed)
Mailed letter with NP's MRI brain result note.

## 2021-01-16 NOTE — Telephone Encounter (Signed)
LVM #2 to call back for MRI results.

## 2021-01-17 ENCOUNTER — Encounter (HOSPITAL_COMMUNITY): Payer: TRICARE For Life (TFL)

## 2021-01-22 HISTORY — PX: APPENDECTOMY: SHX54

## 2021-01-23 ENCOUNTER — Telehealth: Payer: Self-pay | Admitting: Neurology

## 2021-01-23 ENCOUNTER — Ambulatory Visit (HOSPITAL_COMMUNITY)
Admission: RE | Admit: 2021-01-23 | Discharge: 2021-01-23 | Disposition: A | Discharge status: Home / Self Care | Payer: Medicare Other | Source: Ambulatory Visit | Attending: Neurology | Admitting: Neurology

## 2021-01-23 ENCOUNTER — Other Ambulatory Visit: Payer: Self-pay

## 2021-01-23 DIAGNOSIS — G4489 Other headache syndrome: Secondary | ICD-10-CM | POA: Diagnosis not present

## 2021-01-23 DIAGNOSIS — M316 Other giant cell arteritis: Secondary | ICD-10-CM | POA: Diagnosis not present

## 2021-01-23 NOTE — Telephone Encounter (Signed)
  I called the patient.  The carotid Doppler study is unremarkable, the patient will contact our office if she believes that her headaches are worsening as she is coming down on the prednisone.  Carotid doppler 01/23/21:  Summary: Right Carotid: The extracranial vessels were near-normal with only minimal wall                thickening or plaque.  Left Carotid: The extracranial vessels were near-normal with only minimal wall               thickening or plaque.  Vertebrals:  Bilateral vertebral arteries demonstrate antegrade flow. Subclavians: Normal flow hemodynamics were seen in bilateral subclavian              arteries.

## 2021-01-23 NOTE — Progress Notes (Signed)
VASCULAR LAB    Carotid duplex has been performed.  See CV proc for preliminary results.   Linnette Panella, RVT 01/23/2021, 1:46 PM

## 2021-01-24 DIAGNOSIS — M810 Age-related osteoporosis without current pathological fracture: Secondary | ICD-10-CM | POA: Diagnosis not present

## 2021-02-03 ENCOUNTER — Telehealth: Payer: Self-pay | Admitting: Family Medicine

## 2021-02-03 ENCOUNTER — Other Ambulatory Visit: Payer: Self-pay | Admitting: Adult Health

## 2021-02-03 DIAGNOSIS — E876 Hypokalemia: Secondary | ICD-10-CM | POA: Diagnosis present

## 2021-02-03 DIAGNOSIS — R4182 Altered mental status, unspecified: Secondary | ICD-10-CM | POA: Diagnosis not present

## 2021-02-03 DIAGNOSIS — K651 Peritoneal abscess: Secondary | ICD-10-CM | POA: Diagnosis not present

## 2021-02-03 DIAGNOSIS — N179 Acute kidney failure, unspecified: Secondary | ICD-10-CM | POA: Diagnosis not present

## 2021-02-03 DIAGNOSIS — A419 Sepsis, unspecified organism: Secondary | ICD-10-CM | POA: Diagnosis not present

## 2021-02-03 DIAGNOSIS — N200 Calculus of kidney: Secondary | ICD-10-CM | POA: Diagnosis not present

## 2021-02-03 DIAGNOSIS — E162 Hypoglycemia, unspecified: Secondary | ICD-10-CM | POA: Diagnosis not present

## 2021-02-03 DIAGNOSIS — K3532 Acute appendicitis with perforation and localized peritonitis, without abscess: Secondary | ICD-10-CM | POA: Diagnosis not present

## 2021-02-03 DIAGNOSIS — K3533 Acute appendicitis with perforation and localized peritonitis, with abscess: Secondary | ICD-10-CM | POA: Diagnosis present

## 2021-02-03 DIAGNOSIS — K660 Peritoneal adhesions (postprocedural) (postinfection): Secondary | ICD-10-CM | POA: Diagnosis present

## 2021-02-03 DIAGNOSIS — M316 Other giant cell arteritis: Secondary | ICD-10-CM | POA: Diagnosis present

## 2021-02-03 DIAGNOSIS — Z20822 Contact with and (suspected) exposure to covid-19: Secondary | ICD-10-CM | POA: Diagnosis present

## 2021-02-03 DIAGNOSIS — N739 Female pelvic inflammatory disease, unspecified: Secondary | ICD-10-CM | POA: Diagnosis not present

## 2021-02-03 DIAGNOSIS — R1900 Intra-abdominal and pelvic swelling, mass and lump, unspecified site: Secondary | ICD-10-CM | POA: Diagnosis not present

## 2021-02-03 DIAGNOSIS — K5790 Diverticulosis of intestine, part unspecified, without perforation or abscess without bleeding: Secondary | ICD-10-CM | POA: Diagnosis not present

## 2021-02-03 DIAGNOSIS — R140 Abdominal distension (gaseous): Secondary | ICD-10-CM | POA: Diagnosis not present

## 2021-02-03 DIAGNOSIS — R410 Disorientation, unspecified: Secondary | ICD-10-CM | POA: Diagnosis not present

## 2021-02-03 DIAGNOSIS — E860 Dehydration: Secondary | ICD-10-CM | POA: Diagnosis not present

## 2021-02-03 DIAGNOSIS — G8929 Other chronic pain: Secondary | ICD-10-CM | POA: Diagnosis present

## 2021-02-03 DIAGNOSIS — D72829 Elevated white blood cell count, unspecified: Secondary | ICD-10-CM | POA: Diagnosis not present

## 2021-02-03 DIAGNOSIS — M069 Rheumatoid arthritis, unspecified: Secondary | ICD-10-CM | POA: Diagnosis present

## 2021-02-03 DIAGNOSIS — Z7952 Long term (current) use of systemic steroids: Secondary | ICD-10-CM | POA: Diagnosis not present

## 2021-02-03 DIAGNOSIS — J8410 Pulmonary fibrosis, unspecified: Secondary | ICD-10-CM | POA: Diagnosis present

## 2021-02-03 DIAGNOSIS — K760 Fatty (change of) liver, not elsewhere classified: Secondary | ICD-10-CM | POA: Diagnosis not present

## 2021-02-03 DIAGNOSIS — G9341 Metabolic encephalopathy: Secondary | ICD-10-CM | POA: Diagnosis not present

## 2021-02-03 DIAGNOSIS — R109 Unspecified abdominal pain: Secondary | ICD-10-CM | POA: Diagnosis not present

## 2021-02-03 DIAGNOSIS — N189 Chronic kidney disease, unspecified: Secondary | ICD-10-CM | POA: Diagnosis present

## 2021-02-03 DIAGNOSIS — R945 Abnormal results of liver function studies: Secondary | ICD-10-CM | POA: Diagnosis not present

## 2021-02-03 DIAGNOSIS — Z79891 Long term (current) use of opiate analgesic: Secondary | ICD-10-CM | POA: Diagnosis not present

## 2021-02-03 DIAGNOSIS — R0602 Shortness of breath: Secondary | ICD-10-CM | POA: Diagnosis not present

## 2021-02-03 MED ORDER — OXYCODONE HCL ER 10 MG PO T12A: EXTENDED_RELEASE_TABLET | ORAL | 0 refills | Status: DC

## 2021-02-03 MED ORDER — OXYCODONE HCL ER 10 MG PO T12A: 10.0000 mg | EXTENDED_RELEASE_TABLET | Freq: Two times a day (BID) | ORAL | 0 refills | Status: DC

## 2021-02-03 NOTE — Telephone Encounter (Signed)
I sent in a 5 day supply

## 2021-02-03 NOTE — Addendum Note (Signed)
Addended by: Alysia Penna A on: 02/03/2021 12:53 PM   Modules accepted: Orders

## 2021-02-03 NOTE — Telephone Encounter (Signed)
The patient went on vacation to [place] with her family and she did not have her oxyCODONE (OXYCONTIN) 10 mg 12 hr tablet and now they are on the way home and she can barely walk and she is sicker than sick. [name] called to see if we can have this medication sent to her home town pharmacy so they can get the patient better before they take her back home.  [place], [place] Phone:  [phone]  Fax:  [phone]

## 2021-02-03 NOTE — Telephone Encounter (Signed)
Cory can't send it electronically, so another provider would have to send it in.

## 2021-02-03 NOTE — Telephone Encounter (Signed)
Please advise.  Are you able to help with this or should this wait until Dr. Elease Hashimoto returns?

## 2021-02-03 NOTE — Telephone Encounter (Signed)
Dr. Sarajane Jews, would you be able to help with this please?

## 2021-02-03 NOTE — Telephone Encounter (Signed)
Spoke with amy. She is aware that a 5 day supply has been sent in. She stated that the patient had been taken to the hospital. Will send to Dr. Elease Hashimoto as [place]

## 2021-02-13 DIAGNOSIS — K3532 Acute appendicitis with perforation and localized peritonitis, without abscess: Secondary | ICD-10-CM | POA: Diagnosis not present

## 2021-02-13 DIAGNOSIS — M069 Rheumatoid arthritis, unspecified: Secondary | ICD-10-CM | POA: Diagnosis present

## 2021-02-13 DIAGNOSIS — A419 Sepsis, unspecified organism: Secondary | ICD-10-CM | POA: Diagnosis not present

## 2021-02-13 DIAGNOSIS — R279 Unspecified lack of coordination: Secondary | ICD-10-CM | POA: Diagnosis not present

## 2021-02-13 DIAGNOSIS — M6281 Muscle weakness (generalized): Secondary | ICD-10-CM | POA: Diagnosis not present

## 2021-02-13 DIAGNOSIS — I10 Essential (primary) hypertension: Secondary | ICD-10-CM | POA: Diagnosis not present

## 2021-02-13 DIAGNOSIS — N739 Female pelvic inflammatory disease, unspecified: Secondary | ICD-10-CM | POA: Diagnosis not present

## 2021-02-13 DIAGNOSIS — L02211 Cutaneous abscess of abdominal wall: Secondary | ICD-10-CM | POA: Diagnosis not present

## 2021-02-13 DIAGNOSIS — N179 Acute kidney failure, unspecified: Secondary | ICD-10-CM | POA: Diagnosis not present

## 2021-02-13 DIAGNOSIS — B952 Enterococcus as the cause of diseases classified elsewhere: Secondary | ICD-10-CM | POA: Diagnosis present

## 2021-02-13 DIAGNOSIS — Z7952 Long term (current) use of systemic steroids: Secondary | ICD-10-CM | POA: Diagnosis not present

## 2021-02-13 DIAGNOSIS — T8143XA Infection following a procedure, organ and space surgical site, initial encounter: Secondary | ICD-10-CM | POA: Diagnosis present

## 2021-02-13 DIAGNOSIS — E43 Unspecified severe protein-calorie malnutrition: Secondary | ICD-10-CM | POA: Diagnosis present

## 2021-02-13 DIAGNOSIS — E861 Hypovolemia: Secondary | ICD-10-CM | POA: Diagnosis present

## 2021-02-13 DIAGNOSIS — K5730 Diverticulosis of large intestine without perforation or abscess without bleeding: Secondary | ICD-10-CM | POA: Diagnosis not present

## 2021-02-13 DIAGNOSIS — R630 Anorexia: Secondary | ICD-10-CM | POA: Diagnosis not present

## 2021-02-13 DIAGNOSIS — E785 Hyperlipidemia, unspecified: Secondary | ICD-10-CM | POA: Diagnosis present

## 2021-02-13 DIAGNOSIS — Z885 Allergy status to narcotic agent status: Secondary | ICD-10-CM | POA: Diagnosis not present

## 2021-02-13 DIAGNOSIS — K651 Peritoneal abscess: Secondary | ICD-10-CM | POA: Diagnosis not present

## 2021-02-13 DIAGNOSIS — A499 Bacterial infection, unspecified: Secondary | ICD-10-CM | POA: Diagnosis not present

## 2021-02-13 DIAGNOSIS — M19019 Primary osteoarthritis, unspecified shoulder: Secondary | ICD-10-CM | POA: Diagnosis present

## 2021-02-13 DIAGNOSIS — Z6827 Body mass index (BMI) 27.0-27.9, adult: Secondary | ICD-10-CM | POA: Diagnosis not present

## 2021-02-13 DIAGNOSIS — D849 Immunodeficiency, unspecified: Secondary | ICD-10-CM | POA: Diagnosis present

## 2021-02-13 DIAGNOSIS — T8149XA Infection following a procedure, other surgical site, initial encounter: Secondary | ICD-10-CM | POA: Diagnosis not present

## 2021-02-13 DIAGNOSIS — E162 Hypoglycemia, unspecified: Secondary | ICD-10-CM | POA: Diagnosis present

## 2021-02-24 DIAGNOSIS — G7281 Critical illness myopathy: Secondary | ICD-10-CM | POA: Diagnosis present

## 2021-02-24 DIAGNOSIS — A419 Sepsis, unspecified organism: Secondary | ICD-10-CM | POA: Diagnosis not present

## 2021-02-24 DIAGNOSIS — Z6835 Body mass index (BMI) 35.0-35.9, adult: Secondary | ICD-10-CM | POA: Diagnosis not present

## 2021-02-24 DIAGNOSIS — K632 Fistula of intestine: Secondary | ICD-10-CM | POA: Diagnosis not present

## 2021-02-24 DIAGNOSIS — I10 Essential (primary) hypertension: Secondary | ICD-10-CM | POA: Diagnosis present

## 2021-02-24 DIAGNOSIS — R279 Unspecified lack of coordination: Secondary | ICD-10-CM | POA: Diagnosis not present

## 2021-02-24 DIAGNOSIS — K6389 Other specified diseases of intestine: Secondary | ICD-10-CM | POA: Diagnosis not present

## 2021-02-24 DIAGNOSIS — N281 Cyst of kidney, acquired: Secondary | ICD-10-CM | POA: Diagnosis not present

## 2021-02-24 DIAGNOSIS — G8929 Other chronic pain: Secondary | ICD-10-CM | POA: Diagnosis present

## 2021-02-24 DIAGNOSIS — N179 Acute kidney failure, unspecified: Secondary | ICD-10-CM | POA: Diagnosis not present

## 2021-02-24 DIAGNOSIS — L89626 Pressure-induced deep tissue damage of left heel: Secondary | ICD-10-CM | POA: Diagnosis present

## 2021-02-24 DIAGNOSIS — I82409 Acute embolism and thrombosis of unspecified deep veins of unspecified lower extremity: Secondary | ICD-10-CM | POA: Diagnosis not present

## 2021-02-24 DIAGNOSIS — K3580 Unspecified acute appendicitis: Secondary | ICD-10-CM | POA: Diagnosis not present

## 2021-02-24 DIAGNOSIS — J9602 Acute respiratory failure with hypercapnia: Secondary | ICD-10-CM | POA: Diagnosis not present

## 2021-02-24 DIAGNOSIS — K3532 Acute appendicitis with perforation and localized peritonitis, without abscess: Secondary | ICD-10-CM | POA: Diagnosis not present

## 2021-02-24 DIAGNOSIS — L89616 Pressure-induced deep tissue damage of right heel: Secondary | ICD-10-CM | POA: Diagnosis present

## 2021-02-24 DIAGNOSIS — J9690 Respiratory failure, unspecified, unspecified whether with hypoxia or hypercapnia: Secondary | ICD-10-CM | POA: Diagnosis not present

## 2021-02-24 DIAGNOSIS — M069 Rheumatoid arthritis, unspecified: Secondary | ICD-10-CM | POA: Diagnosis present

## 2021-02-24 DIAGNOSIS — K3533 Acute appendicitis with perforation and localized peritonitis, with abscess: Secondary | ICD-10-CM | POA: Diagnosis present

## 2021-02-24 DIAGNOSIS — D638 Anemia in other chronic diseases classified elsewhere: Secondary | ICD-10-CM | POA: Diagnosis not present

## 2021-02-24 DIAGNOSIS — N200 Calculus of kidney: Secondary | ICD-10-CM | POA: Diagnosis not present

## 2021-02-24 DIAGNOSIS — K219 Gastro-esophageal reflux disease without esophagitis: Secondary | ICD-10-CM | POA: Diagnosis present

## 2021-02-24 DIAGNOSIS — E46 Unspecified protein-calorie malnutrition: Secondary | ICD-10-CM | POA: Diagnosis not present

## 2021-02-24 DIAGNOSIS — I498 Other specified cardiac arrhythmias: Secondary | ICD-10-CM | POA: Diagnosis not present

## 2021-02-24 DIAGNOSIS — R109 Unspecified abdominal pain: Secondary | ICD-10-CM | POA: Diagnosis not present

## 2021-02-24 DIAGNOSIS — E43 Unspecified severe protein-calorie malnutrition: Secondary | ICD-10-CM | POA: Diagnosis present

## 2021-02-24 DIAGNOSIS — L02211 Cutaneous abscess of abdominal wall: Secondary | ICD-10-CM | POA: Diagnosis not present

## 2021-02-24 DIAGNOSIS — M6281 Muscle weakness (generalized): Secondary | ICD-10-CM | POA: Diagnosis not present

## 2021-02-24 DIAGNOSIS — T8183XA Persistent postprocedural fistula, initial encounter: Secondary | ICD-10-CM | POA: Diagnosis not present

## 2021-02-24 DIAGNOSIS — K651 Peritoneal abscess: Secondary | ICD-10-CM | POA: Diagnosis not present

## 2021-02-24 DIAGNOSIS — J189 Pneumonia, unspecified organism: Secondary | ICD-10-CM | POA: Diagnosis not present

## 2021-02-24 DIAGNOSIS — L89890 Pressure ulcer of other site, unstageable: Secondary | ICD-10-CM | POA: Diagnosis present

## 2021-02-24 DIAGNOSIS — Z7401 Bed confinement status: Secondary | ICD-10-CM | POA: Diagnosis not present

## 2021-02-25 DIAGNOSIS — G7281 Critical illness myopathy: Secondary | ICD-10-CM | POA: Diagnosis present

## 2021-02-25 DIAGNOSIS — A419 Sepsis, unspecified organism: Secondary | ICD-10-CM | POA: Diagnosis not present

## 2021-02-25 DIAGNOSIS — N179 Acute kidney failure, unspecified: Secondary | ICD-10-CM | POA: Diagnosis not present

## 2021-02-25 DIAGNOSIS — L02211 Cutaneous abscess of abdominal wall: Secondary | ICD-10-CM | POA: Diagnosis not present

## 2021-02-25 DIAGNOSIS — Z6835 Body mass index (BMI) 35.0-35.9, adult: Secondary | ICD-10-CM | POA: Diagnosis not present

## 2021-02-25 DIAGNOSIS — I82409 Acute embolism and thrombosis of unspecified deep veins of unspecified lower extremity: Secondary | ICD-10-CM | POA: Diagnosis not present

## 2021-02-25 DIAGNOSIS — K3533 Acute appendicitis with perforation and localized peritonitis, with abscess: Secondary | ICD-10-CM | POA: Diagnosis present

## 2021-02-25 DIAGNOSIS — N281 Cyst of kidney, acquired: Secondary | ICD-10-CM | POA: Diagnosis not present

## 2021-02-25 DIAGNOSIS — I498 Other specified cardiac arrhythmias: Secondary | ICD-10-CM | POA: Diagnosis not present

## 2021-02-25 DIAGNOSIS — K632 Fistula of intestine: Secondary | ICD-10-CM | POA: Diagnosis not present

## 2021-02-25 DIAGNOSIS — J9690 Respiratory failure, unspecified, unspecified whether with hypoxia or hypercapnia: Secondary | ICD-10-CM | POA: Diagnosis not present

## 2021-02-25 DIAGNOSIS — E46 Unspecified protein-calorie malnutrition: Secondary | ICD-10-CM | POA: Diagnosis not present

## 2021-02-25 DIAGNOSIS — Z7401 Bed confinement status: Secondary | ICD-10-CM | POA: Diagnosis not present

## 2021-02-25 DIAGNOSIS — G8929 Other chronic pain: Secondary | ICD-10-CM | POA: Diagnosis present

## 2021-02-25 DIAGNOSIS — K219 Gastro-esophageal reflux disease without esophagitis: Secondary | ICD-10-CM | POA: Diagnosis present

## 2021-02-25 DIAGNOSIS — J9602 Acute respiratory failure with hypercapnia: Secondary | ICD-10-CM | POA: Diagnosis not present

## 2021-02-25 DIAGNOSIS — L89890 Pressure ulcer of other site, unstageable: Secondary | ICD-10-CM | POA: Diagnosis present

## 2021-02-25 DIAGNOSIS — E43 Unspecified severe protein-calorie malnutrition: Secondary | ICD-10-CM | POA: Diagnosis present

## 2021-02-25 DIAGNOSIS — M069 Rheumatoid arthritis, unspecified: Secondary | ICD-10-CM | POA: Diagnosis not present

## 2021-02-25 DIAGNOSIS — K3580 Unspecified acute appendicitis: Secondary | ICD-10-CM | POA: Diagnosis not present

## 2021-02-25 DIAGNOSIS — T8183XA Persistent postprocedural fistula, initial encounter: Secondary | ICD-10-CM | POA: Diagnosis not present

## 2021-02-25 DIAGNOSIS — K651 Peritoneal abscess: Secondary | ICD-10-CM | POA: Diagnosis not present

## 2021-02-25 DIAGNOSIS — M6281 Muscle weakness (generalized): Secondary | ICD-10-CM | POA: Diagnosis not present

## 2021-02-25 DIAGNOSIS — J189 Pneumonia, unspecified organism: Secondary | ICD-10-CM | POA: Diagnosis not present

## 2021-02-25 DIAGNOSIS — L89616 Pressure-induced deep tissue damage of right heel: Secondary | ICD-10-CM | POA: Diagnosis present

## 2021-02-25 DIAGNOSIS — R109 Unspecified abdominal pain: Secondary | ICD-10-CM | POA: Diagnosis not present

## 2021-02-25 DIAGNOSIS — K3532 Acute appendicitis with perforation and localized peritonitis, without abscess: Secondary | ICD-10-CM | POA: Diagnosis not present

## 2021-02-25 DIAGNOSIS — I10 Essential (primary) hypertension: Secondary | ICD-10-CM | POA: Diagnosis not present

## 2021-02-25 DIAGNOSIS — K6389 Other specified diseases of intestine: Secondary | ICD-10-CM | POA: Diagnosis not present

## 2021-02-25 DIAGNOSIS — N200 Calculus of kidney: Secondary | ICD-10-CM | POA: Diagnosis not present

## 2021-02-25 DIAGNOSIS — L89626 Pressure-induced deep tissue damage of left heel: Secondary | ICD-10-CM | POA: Diagnosis present

## 2021-02-25 DIAGNOSIS — R279 Unspecified lack of coordination: Secondary | ICD-10-CM | POA: Diagnosis not present

## 2021-02-25 DIAGNOSIS — D638 Anemia in other chronic diseases classified elsewhere: Secondary | ICD-10-CM | POA: Diagnosis not present

## 2021-02-26 DIAGNOSIS — A419 Sepsis, unspecified organism: Secondary | ICD-10-CM | POA: Diagnosis not present

## 2021-02-26 DIAGNOSIS — I10 Essential (primary) hypertension: Secondary | ICD-10-CM | POA: Diagnosis not present

## 2021-02-27 DIAGNOSIS — N179 Acute kidney failure, unspecified: Secondary | ICD-10-CM | POA: Diagnosis not present

## 2021-02-27 DIAGNOSIS — K632 Fistula of intestine: Secondary | ICD-10-CM | POA: Diagnosis not present

## 2021-02-27 DIAGNOSIS — M069 Rheumatoid arthritis, unspecified: Secondary | ICD-10-CM | POA: Diagnosis not present

## 2021-02-27 DIAGNOSIS — A419 Sepsis, unspecified organism: Secondary | ICD-10-CM | POA: Diagnosis not present

## 2021-02-28 DIAGNOSIS — N179 Acute kidney failure, unspecified: Secondary | ICD-10-CM | POA: Diagnosis not present

## 2021-02-28 DIAGNOSIS — A419 Sepsis, unspecified organism: Secondary | ICD-10-CM | POA: Diagnosis not present

## 2021-02-28 DIAGNOSIS — M069 Rheumatoid arthritis, unspecified: Secondary | ICD-10-CM | POA: Diagnosis not present

## 2021-02-28 DIAGNOSIS — K6389 Other specified diseases of intestine: Secondary | ICD-10-CM | POA: Diagnosis not present

## 2021-02-28 DIAGNOSIS — K632 Fistula of intestine: Secondary | ICD-10-CM | POA: Diagnosis not present

## 2021-02-28 DIAGNOSIS — J9690 Respiratory failure, unspecified, unspecified whether with hypoxia or hypercapnia: Secondary | ICD-10-CM | POA: Diagnosis not present

## 2021-03-01 DIAGNOSIS — K632 Fistula of intestine: Secondary | ICD-10-CM | POA: Diagnosis not present

## 2021-03-01 DIAGNOSIS — N179 Acute kidney failure, unspecified: Secondary | ICD-10-CM | POA: Diagnosis not present

## 2021-03-01 DIAGNOSIS — M069 Rheumatoid arthritis, unspecified: Secondary | ICD-10-CM | POA: Diagnosis not present

## 2021-03-01 DIAGNOSIS — A419 Sepsis, unspecified organism: Secondary | ICD-10-CM | POA: Diagnosis not present

## 2021-03-02 DIAGNOSIS — M069 Rheumatoid arthritis, unspecified: Secondary | ICD-10-CM | POA: Diagnosis not present

## 2021-03-02 DIAGNOSIS — K6389 Other specified diseases of intestine: Secondary | ICD-10-CM | POA: Diagnosis not present

## 2021-03-02 DIAGNOSIS — J9690 Respiratory failure, unspecified, unspecified whether with hypoxia or hypercapnia: Secondary | ICD-10-CM | POA: Diagnosis not present

## 2021-03-02 DIAGNOSIS — A419 Sepsis, unspecified organism: Secondary | ICD-10-CM | POA: Diagnosis not present

## 2021-03-02 DIAGNOSIS — N179 Acute kidney failure, unspecified: Secondary | ICD-10-CM | POA: Diagnosis not present

## 2021-03-02 DIAGNOSIS — K632 Fistula of intestine: Secondary | ICD-10-CM | POA: Diagnosis not present

## 2021-03-03 DIAGNOSIS — K632 Fistula of intestine: Secondary | ICD-10-CM | POA: Diagnosis not present

## 2021-03-03 DIAGNOSIS — A419 Sepsis, unspecified organism: Secondary | ICD-10-CM | POA: Diagnosis not present

## 2021-03-03 DIAGNOSIS — N179 Acute kidney failure, unspecified: Secondary | ICD-10-CM | POA: Diagnosis not present

## 2021-03-03 DIAGNOSIS — M069 Rheumatoid arthritis, unspecified: Secondary | ICD-10-CM | POA: Diagnosis not present

## 2021-03-04 DIAGNOSIS — K632 Fistula of intestine: Secondary | ICD-10-CM | POA: Diagnosis not present

## 2021-03-04 DIAGNOSIS — N179 Acute kidney failure, unspecified: Secondary | ICD-10-CM | POA: Diagnosis not present

## 2021-03-04 DIAGNOSIS — A419 Sepsis, unspecified organism: Secondary | ICD-10-CM | POA: Diagnosis not present

## 2021-03-04 DIAGNOSIS — M069 Rheumatoid arthritis, unspecified: Secondary | ICD-10-CM | POA: Diagnosis not present

## 2021-03-05 DIAGNOSIS — N179 Acute kidney failure, unspecified: Secondary | ICD-10-CM | POA: Diagnosis not present

## 2021-03-05 DIAGNOSIS — M069 Rheumatoid arthritis, unspecified: Secondary | ICD-10-CM | POA: Diagnosis not present

## 2021-03-05 DIAGNOSIS — K632 Fistula of intestine: Secondary | ICD-10-CM | POA: Diagnosis not present

## 2021-03-05 DIAGNOSIS — A419 Sepsis, unspecified organism: Secondary | ICD-10-CM | POA: Diagnosis not present

## 2021-03-06 DIAGNOSIS — K3580 Unspecified acute appendicitis: Secondary | ICD-10-CM | POA: Diagnosis not present

## 2021-03-06 DIAGNOSIS — E46 Unspecified protein-calorie malnutrition: Secondary | ICD-10-CM | POA: Diagnosis not present

## 2021-03-06 DIAGNOSIS — I10 Essential (primary) hypertension: Secondary | ICD-10-CM | POA: Diagnosis not present

## 2021-03-07 DIAGNOSIS — I10 Essential (primary) hypertension: Secondary | ICD-10-CM | POA: Diagnosis not present

## 2021-03-07 DIAGNOSIS — K3580 Unspecified acute appendicitis: Secondary | ICD-10-CM | POA: Diagnosis not present

## 2021-03-07 DIAGNOSIS — E46 Unspecified protein-calorie malnutrition: Secondary | ICD-10-CM | POA: Diagnosis not present

## 2021-03-08 DIAGNOSIS — I10 Essential (primary) hypertension: Secondary | ICD-10-CM | POA: Diagnosis not present

## 2021-03-08 DIAGNOSIS — K3580 Unspecified acute appendicitis: Secondary | ICD-10-CM | POA: Diagnosis not present

## 2021-03-08 DIAGNOSIS — E46 Unspecified protein-calorie malnutrition: Secondary | ICD-10-CM | POA: Diagnosis not present

## 2021-03-09 DIAGNOSIS — E46 Unspecified protein-calorie malnutrition: Secondary | ICD-10-CM | POA: Diagnosis not present

## 2021-03-09 DIAGNOSIS — I10 Essential (primary) hypertension: Secondary | ICD-10-CM | POA: Diagnosis not present

## 2021-03-09 DIAGNOSIS — K3580 Unspecified acute appendicitis: Secondary | ICD-10-CM | POA: Diagnosis not present

## 2021-03-10 DIAGNOSIS — T8183XA Persistent postprocedural fistula, initial encounter: Secondary | ICD-10-CM | POA: Diagnosis not present

## 2021-03-10 DIAGNOSIS — K3580 Unspecified acute appendicitis: Secondary | ICD-10-CM | POA: Diagnosis not present

## 2021-03-10 DIAGNOSIS — I10 Essential (primary) hypertension: Secondary | ICD-10-CM | POA: Diagnosis not present

## 2021-03-10 DIAGNOSIS — E46 Unspecified protein-calorie malnutrition: Secondary | ICD-10-CM | POA: Diagnosis not present

## 2021-03-11 DIAGNOSIS — M069 Rheumatoid arthritis, unspecified: Secondary | ICD-10-CM | POA: Diagnosis not present

## 2021-03-11 DIAGNOSIS — K3532 Acute appendicitis with perforation and localized peritonitis, without abscess: Secondary | ICD-10-CM | POA: Diagnosis not present

## 2021-03-11 DIAGNOSIS — I82409 Acute embolism and thrombosis of unspecified deep veins of unspecified lower extremity: Secondary | ICD-10-CM | POA: Diagnosis not present

## 2021-03-12 DIAGNOSIS — M069 Rheumatoid arthritis, unspecified: Secondary | ICD-10-CM | POA: Diagnosis not present

## 2021-03-12 DIAGNOSIS — I82409 Acute embolism and thrombosis of unspecified deep veins of unspecified lower extremity: Secondary | ICD-10-CM | POA: Diagnosis not present

## 2021-03-12 DIAGNOSIS — K3532 Acute appendicitis with perforation and localized peritonitis, without abscess: Secondary | ICD-10-CM | POA: Diagnosis not present

## 2021-03-13 DIAGNOSIS — K651 Peritoneal abscess: Secondary | ICD-10-CM | POA: Diagnosis not present

## 2021-03-13 DIAGNOSIS — M069 Rheumatoid arthritis, unspecified: Secondary | ICD-10-CM | POA: Diagnosis not present

## 2021-03-13 DIAGNOSIS — K3532 Acute appendicitis with perforation and localized peritonitis, without abscess: Secondary | ICD-10-CM | POA: Diagnosis not present

## 2021-03-13 DIAGNOSIS — J9602 Acute respiratory failure with hypercapnia: Secondary | ICD-10-CM | POA: Diagnosis not present

## 2021-03-14 DIAGNOSIS — M069 Rheumatoid arthritis, unspecified: Secondary | ICD-10-CM | POA: Diagnosis not present

## 2021-03-14 DIAGNOSIS — K651 Peritoneal abscess: Secondary | ICD-10-CM | POA: Diagnosis not present

## 2021-03-14 DIAGNOSIS — K3532 Acute appendicitis with perforation and localized peritonitis, without abscess: Secondary | ICD-10-CM | POA: Diagnosis not present

## 2021-03-14 DIAGNOSIS — J9602 Acute respiratory failure with hypercapnia: Secondary | ICD-10-CM | POA: Diagnosis not present

## 2021-03-15 DIAGNOSIS — J9602 Acute respiratory failure with hypercapnia: Secondary | ICD-10-CM | POA: Diagnosis not present

## 2021-03-15 DIAGNOSIS — K3532 Acute appendicitis with perforation and localized peritonitis, without abscess: Secondary | ICD-10-CM | POA: Diagnosis not present

## 2021-03-15 DIAGNOSIS — M069 Rheumatoid arthritis, unspecified: Secondary | ICD-10-CM | POA: Diagnosis not present

## 2021-03-15 DIAGNOSIS — K651 Peritoneal abscess: Secondary | ICD-10-CM | POA: Diagnosis not present

## 2021-03-16 DIAGNOSIS — M069 Rheumatoid arthritis, unspecified: Secondary | ICD-10-CM | POA: Diagnosis not present

## 2021-03-16 DIAGNOSIS — J9602 Acute respiratory failure with hypercapnia: Secondary | ICD-10-CM | POA: Diagnosis not present

## 2021-03-16 DIAGNOSIS — K651 Peritoneal abscess: Secondary | ICD-10-CM | POA: Diagnosis not present

## 2021-03-16 DIAGNOSIS — K6389 Other specified diseases of intestine: Secondary | ICD-10-CM | POA: Diagnosis not present

## 2021-03-16 DIAGNOSIS — K3532 Acute appendicitis with perforation and localized peritonitis, without abscess: Secondary | ICD-10-CM | POA: Diagnosis not present

## 2021-03-17 DIAGNOSIS — M069 Rheumatoid arthritis, unspecified: Secondary | ICD-10-CM | POA: Diagnosis not present

## 2021-03-17 DIAGNOSIS — J9602 Acute respiratory failure with hypercapnia: Secondary | ICD-10-CM | POA: Diagnosis not present

## 2021-03-17 DIAGNOSIS — K651 Peritoneal abscess: Secondary | ICD-10-CM | POA: Diagnosis not present

## 2021-03-17 DIAGNOSIS — K3532 Acute appendicitis with perforation and localized peritonitis, without abscess: Secondary | ICD-10-CM | POA: Diagnosis not present

## 2021-03-18 DIAGNOSIS — K3532 Acute appendicitis with perforation and localized peritonitis, without abscess: Secondary | ICD-10-CM | POA: Diagnosis not present

## 2021-03-18 DIAGNOSIS — J9602 Acute respiratory failure with hypercapnia: Secondary | ICD-10-CM | POA: Diagnosis not present

## 2021-03-18 DIAGNOSIS — R109 Unspecified abdominal pain: Secondary | ICD-10-CM | POA: Diagnosis not present

## 2021-03-18 DIAGNOSIS — M069 Rheumatoid arthritis, unspecified: Secondary | ICD-10-CM | POA: Diagnosis not present

## 2021-03-18 DIAGNOSIS — K651 Peritoneal abscess: Secondary | ICD-10-CM | POA: Diagnosis not present

## 2021-03-19 DIAGNOSIS — K3532 Acute appendicitis with perforation and localized peritonitis, without abscess: Secondary | ICD-10-CM | POA: Diagnosis not present

## 2021-03-19 DIAGNOSIS — K651 Peritoneal abscess: Secondary | ICD-10-CM | POA: Diagnosis not present

## 2021-03-19 DIAGNOSIS — J9602 Acute respiratory failure with hypercapnia: Secondary | ICD-10-CM | POA: Diagnosis not present

## 2021-03-19 DIAGNOSIS — M069 Rheumatoid arthritis, unspecified: Secondary | ICD-10-CM | POA: Diagnosis not present

## 2021-03-21 DIAGNOSIS — N200 Calculus of kidney: Secondary | ICD-10-CM | POA: Diagnosis not present

## 2021-03-21 DIAGNOSIS — M6281 Muscle weakness (generalized): Secondary | ICD-10-CM | POA: Diagnosis not present

## 2021-03-21 DIAGNOSIS — E43 Unspecified severe protein-calorie malnutrition: Secondary | ICD-10-CM | POA: Diagnosis present

## 2021-03-21 DIAGNOSIS — R269 Unspecified abnormalities of gait and mobility: Secondary | ICD-10-CM | POA: Diagnosis not present

## 2021-03-21 DIAGNOSIS — M069 Rheumatoid arthritis, unspecified: Secondary | ICD-10-CM | POA: Diagnosis present

## 2021-03-21 DIAGNOSIS — K5790 Diverticulosis of intestine, part unspecified, without perforation or abscess without bleeding: Secondary | ICD-10-CM | POA: Diagnosis not present

## 2021-03-21 DIAGNOSIS — I10 Essential (primary) hypertension: Secondary | ICD-10-CM | POA: Diagnosis present

## 2021-03-21 DIAGNOSIS — R279 Unspecified lack of coordination: Secondary | ICD-10-CM | POA: Diagnosis not present

## 2021-03-21 DIAGNOSIS — Z6830 Body mass index (BMI) 30.0-30.9, adult: Secondary | ICD-10-CM | POA: Diagnosis not present

## 2021-03-21 DIAGNOSIS — A419 Sepsis, unspecified organism: Secondary | ICD-10-CM | POA: Diagnosis not present

## 2021-03-21 DIAGNOSIS — L02211 Cutaneous abscess of abdominal wall: Secondary | ICD-10-CM | POA: Diagnosis not present

## 2021-03-21 DIAGNOSIS — J8410 Pulmonary fibrosis, unspecified: Secondary | ICD-10-CM | POA: Diagnosis not present

## 2021-03-21 DIAGNOSIS — N281 Cyst of kidney, acquired: Secondary | ICD-10-CM | POA: Diagnosis not present

## 2021-03-21 DIAGNOSIS — K3533 Acute appendicitis with perforation and localized peritonitis, with abscess: Secondary | ICD-10-CM | POA: Diagnosis not present

## 2021-03-21 DIAGNOSIS — Z885 Allergy status to narcotic agent status: Secondary | ICD-10-CM | POA: Diagnosis not present

## 2021-03-21 DIAGNOSIS — R2681 Unsteadiness on feet: Secondary | ICD-10-CM | POA: Diagnosis not present

## 2021-03-21 DIAGNOSIS — R634 Abnormal weight loss: Secondary | ICD-10-CM | POA: Diagnosis not present

## 2021-03-21 DIAGNOSIS — Z9049 Acquired absence of other specified parts of digestive tract: Secondary | ICD-10-CM | POA: Diagnosis not present

## 2021-03-21 DIAGNOSIS — Z853 Personal history of malignant neoplasm of breast: Secondary | ICD-10-CM | POA: Diagnosis not present

## 2021-03-21 DIAGNOSIS — R630 Anorexia: Secondary | ICD-10-CM | POA: Diagnosis not present

## 2021-03-21 DIAGNOSIS — Z7952 Long term (current) use of systemic steroids: Secondary | ICD-10-CM | POA: Diagnosis not present

## 2021-03-21 DIAGNOSIS — K651 Peritoneal abscess: Secondary | ICD-10-CM | POA: Diagnosis not present

## 2021-03-21 DIAGNOSIS — J189 Pneumonia, unspecified organism: Secondary | ICD-10-CM | POA: Diagnosis not present

## 2021-03-21 DIAGNOSIS — E785 Hyperlipidemia, unspecified: Secondary | ICD-10-CM | POA: Diagnosis present

## 2021-03-23 DIAGNOSIS — E46 Unspecified protein-calorie malnutrition: Secondary | ICD-10-CM | POA: Diagnosis not present

## 2021-03-23 DIAGNOSIS — Z4889 Encounter for other specified surgical aftercare: Secondary | ICD-10-CM | POA: Diagnosis not present

## 2021-03-23 DIAGNOSIS — E43 Unspecified severe protein-calorie malnutrition: Secondary | ICD-10-CM | POA: Diagnosis not present

## 2021-03-23 DIAGNOSIS — J8410 Pulmonary fibrosis, unspecified: Secondary | ICD-10-CM | POA: Diagnosis not present

## 2021-03-23 DIAGNOSIS — R2681 Unsteadiness on feet: Secondary | ICD-10-CM | POA: Diagnosis not present

## 2021-03-23 DIAGNOSIS — M4325 Fusion of spine, thoracolumbar region: Secondary | ICD-10-CM | POA: Diagnosis not present

## 2021-03-23 DIAGNOSIS — Z9882 Breast implant status: Secondary | ICD-10-CM | POA: Diagnosis not present

## 2021-03-23 DIAGNOSIS — K651 Peritoneal abscess: Secondary | ICD-10-CM | POA: Diagnosis not present

## 2021-03-23 DIAGNOSIS — R269 Unspecified abnormalities of gait and mobility: Secondary | ICD-10-CM | POA: Diagnosis not present

## 2021-03-23 DIAGNOSIS — A419 Sepsis, unspecified organism: Secondary | ICD-10-CM | POA: Diagnosis not present

## 2021-03-23 DIAGNOSIS — R279 Unspecified lack of coordination: Secondary | ICD-10-CM | POA: Diagnosis not present

## 2021-03-23 DIAGNOSIS — K3532 Acute appendicitis with perforation and localized peritonitis, without abscess: Secondary | ICD-10-CM | POA: Diagnosis not present

## 2021-03-23 DIAGNOSIS — Z9049 Acquired absence of other specified parts of digestive tract: Secondary | ICD-10-CM | POA: Diagnosis not present

## 2021-03-23 DIAGNOSIS — N281 Cyst of kidney, acquired: Secondary | ICD-10-CM | POA: Diagnosis not present

## 2021-03-23 DIAGNOSIS — M6281 Muscle weakness (generalized): Secondary | ICD-10-CM | POA: Diagnosis not present

## 2021-03-23 DIAGNOSIS — R000 Tachycardia, unspecified: Secondary | ICD-10-CM | POA: Diagnosis not present

## 2021-03-23 DIAGNOSIS — K5730 Diverticulosis of large intestine without perforation or abscess without bleeding: Secondary | ICD-10-CM | POA: Diagnosis not present

## 2021-03-23 DIAGNOSIS — K5790 Diverticulosis of intestine, part unspecified, without perforation or abscess without bleeding: Secondary | ICD-10-CM | POA: Diagnosis not present

## 2021-03-23 DIAGNOSIS — J984 Other disorders of lung: Secondary | ICD-10-CM | POA: Diagnosis not present

## 2021-03-23 DIAGNOSIS — I10 Essential (primary) hypertension: Secondary | ICD-10-CM | POA: Diagnosis not present

## 2021-03-23 DIAGNOSIS — M069 Rheumatoid arthritis, unspecified: Secondary | ICD-10-CM | POA: Diagnosis not present

## 2021-03-23 DIAGNOSIS — Z049 Encounter for examination and observation for unspecified reason: Secondary | ICD-10-CM | POA: Diagnosis not present

## 2021-03-23 DIAGNOSIS — K3533 Acute appendicitis with perforation and localized peritonitis, with abscess: Secondary | ICD-10-CM | POA: Diagnosis not present

## 2021-03-23 DIAGNOSIS — I700 Atherosclerosis of aorta: Secondary | ICD-10-CM | POA: Diagnosis not present

## 2021-03-23 DIAGNOSIS — L89154 Pressure ulcer of sacral region, stage 4: Secondary | ICD-10-CM | POA: Diagnosis not present

## 2021-03-24 DIAGNOSIS — K651 Peritoneal abscess: Secondary | ICD-10-CM | POA: Diagnosis not present

## 2021-03-24 DIAGNOSIS — K3532 Acute appendicitis with perforation and localized peritonitis, without abscess: Secondary | ICD-10-CM | POA: Diagnosis not present

## 2021-03-24 DIAGNOSIS — E46 Unspecified protein-calorie malnutrition: Secondary | ICD-10-CM | POA: Diagnosis not present

## 2021-03-24 DIAGNOSIS — I10 Essential (primary) hypertension: Secondary | ICD-10-CM | POA: Diagnosis not present

## 2021-03-24 DIAGNOSIS — M069 Rheumatoid arthritis, unspecified: Secondary | ICD-10-CM | POA: Diagnosis not present

## 2021-04-05 DIAGNOSIS — Z9882 Breast implant status: Secondary | ICD-10-CM | POA: Diagnosis not present

## 2021-04-05 DIAGNOSIS — N281 Cyst of kidney, acquired: Secondary | ICD-10-CM | POA: Diagnosis not present

## 2021-04-05 DIAGNOSIS — K651 Peritoneal abscess: Secondary | ICD-10-CM | POA: Diagnosis not present

## 2021-04-05 DIAGNOSIS — J984 Other disorders of lung: Secondary | ICD-10-CM | POA: Diagnosis not present

## 2021-04-06 DIAGNOSIS — Z9049 Acquired absence of other specified parts of digestive tract: Secondary | ICD-10-CM | POA: Diagnosis not present

## 2021-04-06 DIAGNOSIS — K651 Peritoneal abscess: Secondary | ICD-10-CM | POA: Diagnosis not present

## 2021-04-07 DIAGNOSIS — J8410 Pulmonary fibrosis, unspecified: Secondary | ICD-10-CM | POA: Diagnosis not present

## 2021-04-07 DIAGNOSIS — R000 Tachycardia, unspecified: Secondary | ICD-10-CM | POA: Diagnosis not present

## 2021-04-07 DIAGNOSIS — Z4889 Encounter for other specified surgical aftercare: Secondary | ICD-10-CM | POA: Diagnosis not present

## 2021-04-07 DIAGNOSIS — K651 Peritoneal abscess: Secondary | ICD-10-CM | POA: Diagnosis not present

## 2021-04-07 DIAGNOSIS — Z049 Encounter for examination and observation for unspecified reason: Secondary | ICD-10-CM | POA: Diagnosis not present

## 2021-04-07 DIAGNOSIS — K5730 Diverticulosis of large intestine without perforation or abscess without bleeding: Secondary | ICD-10-CM | POA: Diagnosis not present

## 2021-04-10 ENCOUNTER — Encounter: Payer: Self-pay | Admitting: Neurology

## 2021-04-10 ENCOUNTER — Ambulatory Visit: Payer: Medicare Other | Admitting: Neurology

## 2021-04-10 DIAGNOSIS — K5730 Diverticulosis of large intestine without perforation or abscess without bleeding: Secondary | ICD-10-CM | POA: Diagnosis not present

## 2021-04-10 DIAGNOSIS — K651 Peritoneal abscess: Secondary | ICD-10-CM | POA: Diagnosis not present

## 2021-04-10 DIAGNOSIS — J8410 Pulmonary fibrosis, unspecified: Secondary | ICD-10-CM | POA: Diagnosis not present

## 2021-04-10 NOTE — Progress Notes (Deleted)
PATIENT: [name] DOB: [birth year]  REASON FOR VISIT: follow up HISTORY FROM: patient Primary Neurologist:  HISTORY OF PRESENT ILLNESS: Today 04/10/21 [name] is an 80 year old female who has been seen in the office recently for a new problem on March 2022 with a left frontotemporal headache, up concerning history of temporal arteritis in the right eye with vision loss. Has RA on low dose prednisone.  MRI of the brain showed moderate level SVD, some progression since 2014.  Carotid Doppler study was negative.  HISTORY  01/03/2021 Dr. [name]: [name] is an 80 year old right-handed white female with a history of temporal arteritis with visual loss on the right eye.  She also has rheumatoid arthritis and has been treated with low-dose prednisone of 4 mg daily.  The patient has had lumbosacral spine surgery on several occasions, she has a chronic left foot drop secondary to a prior L5 radiculopathy.  She does report some chronic insomnia.  The patient also has a history of cervical myelopathy and peripheral neuropathy.  Beginning around the third week in March 2022, the patient began having a left frontotemporal headache.  The patient reported some mild vision changes, she has been seen by ophthalmology in this has not shown any significant abnormalities.  The patient also felt some alteration in balance.  She uses a walker for ambulation, she has not had any recent falls but she did fall 2 months ago and claims that she did strike the back of the head with the fall.  She has been taking over-the-counter medications for the headache.  She was seen through this office on 29 December 2020, she was started on 60 mg of prednisone which seemed to help the headache but not eliminate the headache.  She still has some left frontotemporal headache events.  She has no overt new numbness or weakness of extremities.  The visual acuity is at baseline currently.  She returns the office today for an  evaluation.  MRI of the brain has been ordered but not yet done.  Blood work showed a sedimentation rate of 15.  REVIEW OF SYSTEMS: Out of a complete 14 system review of symptoms, the patient complains only of the following symptoms, and all other reviewed systems are negative.  ALLERGIES: Allergies  Allergen Reactions   Hydromorphone Other (See Comments)    Cognitive changes  "made me unconscious"    HOME MEDICATIONS: Outpatient Medications Prior to Visit  Medication Sig Dispense Refill   amLODipine (NORVASC) 5 MG tablet TAKE 1 TABLET DAILY 90 tablet 3   Calcium Carb-Cholecalciferol (CALCIUM CARBONATE-VITAMIN D3 PO) Take 1 tablet by mouth daily     cholecalciferol (VITAMIN D) 1000 UNITS tablet Take 1,000 Units by mouth daily.     denosumab (PROLIA) 60 MG/ML SOLN injection Inject 60 mg into the skin every 6 (six) months.     diclofenac sodium (VOLTAREN) 1 % GEL APPLY 2 GRAMS TOPICALLY FOUR TIMES A DAY 400 g 6   ferrous sulfate 325 (65 FE) MG tablet Take 325 mg by mouth 2 (two) times daily.     furosemide (LASIX) 40 MG tablet TAKE 1 TABLET DAILY 90 tablet 3   gabapentin (NEURONTIN) 300 MG capsule Take 2 capsules (600 mg total) by mouth 2 (two) times daily. 360 capsule 3   Golimumab (SIMPONI ARIA IV) Inject into the vein. Takes infusion every 8 wks.     Multiple Vitamins-Minerals (MULTIVITAMIN,TX-MINERALS) tablet Take 1 tablet by mouth daily.     oxyCODONE (OXYCONTIN)  10 mg 12 hr tablet Take 1 tablet by mouth every 12 hours.  May refill in 2 months 60 tablet 0   oxyCODONE (OXYCONTIN) 10 mg 12 hr tablet Take 1 tablet (10 mg total) by mouth every 12 (twelve) hours. 60 tablet 0   oxyCODONE (OXYCONTIN) 10 mg 12 hr tablet Take one tablet by mouth every 12 hours. 10 tablet 0   Oxycodone HCl 10 MG TABS Take 1 tablet (10 mg total) by mouth every 6 (six) hours as needed. Take 1 tablet by mouth every 6 hours as needed for breakthrough pain 90 tablet 0   potassium chloride (KLOR-CON) 10 MEQ tablet  TAKE 1 TABLET TWICE A DAY 180 tablet 0   predniSONE (DELTASONE) 1 MG tablet TAKE 4 TABLETS DAILY WITH BREAKFAST 360 tablet 3   predniSONE (DELTASONE) 10 MG tablet Begin 5 tablets a day, taper by one tablet each week 90 tablet 2   simvastatin (ZOCOR) 5 MG tablet TAKE 1 TABLET AT BEDTIME 90 tablet 3   tizanidine (ZANAFLEX) 2 MG capsule Take 1 capsule (2 mg total) by mouth as needed for muscle spasms (take 1 at bedtime as needed for muscle spasms). 30 capsule 3   No facility-administered medications prior to visit.    PAST MEDICAL HISTORY: Past Medical History:  Diagnosis Date   Allergic rhinitis    Anemia    Anxiety    Bronchiectasis    Carpal tunnel syndrome 07/13/2015   Bilateral   CVA (cerebral infarction) 10/2007   Right thalamic    Diverticulosis of colon    Gait disorder    GERD (gastroesophageal reflux disease)    History of breast cancer    HTN (hypertension)    Hyperlipidemia    Idiopathic pulmonary fibrosis    Left knee DJD    Migraine    Osteoporosis    Peripheral neuropathy    PMR (polymyalgia rheumatica) (HCC)    Polyneuropathy in other diseases classified elsewhere ([place]) 02/17/2013   Previous back surgery 10/09/12   Renal insufficiency    Rheumatoid arthritis (HCC)    Seizure disorder (HCC)    Spondylosis, cervical, with myelopathy 08/31/2015   C3-4 myelopathy   Temporal arteritis (HCC)    Right eye blind, on steroids per [facility]/ Dr [name]   Type II or unspecified type diabetes mellitus without mention of complication, not stated as uncontrolled    2nd to steriods   Vitamin D deficiency     PAST SURGICAL HISTORY: Past Surgical History:  Procedure Laterality Date   CARPAL TUNNEL RELEASE Right    CATARACT EXTRACTION     OS - Summer 2010   CERVICAL FUSION  2010   4 rods and pins in place   CHOLECYSTECTOMY     COLONOSCOPY  12/15/2011   Procedure: COLONOSCOPY;  Surgeon: [name], MD;  Location: [facility];  Service: Endoscopy;  Laterality: N/A;    LUMBAR FUSION  04/2010   W/Mechanical fixation - [facility]   LUMBAR FUSION  2013   rods in hips (to stabilize).   MASTECTOMY     NECK SURGERY     rheumatoid nodule removal     SPINAL FUSION  07/2010   T10-L2 interbody fusion / [facility]   TONSILLECTOMY     TOTAL KNEE ARTHROPLASTY     Left    FAMILY HISTORY: Family History  Problem Relation Age of Onset   Brain cancer Mother    Hypertension Mother    Arthritis Mother    Dementia  Father    Hypertension Father    Arthritis Father    Breast cancer Sister    Lung cancer Sister    Lung disease Neg Hx    Rheumatologic disease Neg Hx     SOCIAL HISTORY: Social History   Socioeconomic History   Marital status: Widowed    Spouse name: Not on file   Number of children: 1   Years of education: 12+ coll.   Highest education level: Not on file  Occupational History   Occupation: Education officer, environmental: RETIRED    Comment: retired  Tobacco Use   Smoking status: Never   Smokeless tobacco: Never   Tobacco comments:    Father   Vaping Use   Vaping Use: Never used  Substance and Sexual Activity   Alcohol use: No    Alcohol/week: 0.0 standard drinks    Comment: heavy drinker until 1995 - sobriety with AA   Drug use: No   Sexual activity: Not Currently  Other Topics Concern   Not on file  Social History Narrative   Lives alone   Right handed   Drinks 6-8 cups caffeine daily      Social Determinants of Health   Financial Resource Strain: Not on file  Food Insecurity: Not on file  Transportation Needs: Not on file  Physical Activity: Not on file  Stress: Not on file  Social Connections: Moderately Integrated   Frequency of Communication with Friends and Family: More than three times a week   Frequency of Social Gatherings with Friends and Family: Once a week   Attends Religious Services: 1 to 4 times per year   Active Member of [employer] or Organizations: Yes    Attends Archivist Meetings: More than 4 times per year   Marital Status: Widowed  Intimate Partner Violence: Not on file      PHYSICAL EXAM  There were no vitals filed for this visit. There is no height or weight on file to calculate BMI.  Generalized: Well developed, in no acute distress   Neurological examination  Mentation: Alert oriented to time, place, history taking. Follows all commands speech and language fluent Cranial nerve II-XII: Pupils were equal round reactive to light. Extraocular movements were full, visual field were full on confrontational test. Facial sensation and strength were normal. Uvula tongue midline. Head turning and shoulder shrug  were normal and symmetric. Motor: The motor testing reveals 5 over 5 strength of all 4 extremities. Good symmetric motor tone is noted throughout.  Sensory: Sensory testing is intact to soft touch on all 4 extremities. No evidence of extinction is noted.  Coordination: Cerebellar testing reveals good finger-nose-finger and heel-to-shin bilaterally.  Gait and station: Gait is normal. Tandem gait is normal. Romberg is negative. No drift is seen.  Reflexes: Deep tendon reflexes are symmetric and normal bilaterally.   DIAGNOSTIC DATA (LABS, IMAGING, TESTING) - I reviewed patient records, labs, notes, testing and imaging myself where available.  Lab Results  Component Value Date   WBC 7.6 09/14/2020   HGB 13.5 09/14/2020   HCT 40.9 09/14/2020   MCV 98.8 09/14/2020   PLT 232 09/14/2020      Component Value Date/Time   NA 140 10/18/2020 1142   NA 143 06/25/2018 0000   K 4.6 10/18/2020 1142   CL 101 10/18/2020 1142   CO2 34 (H) 10/18/2020 1142   GLUCOSE 100 (H) 10/18/2020 1142   GLUCOSE 132 (H) 10/01/2006 1605  BUN 24 (H) 10/18/2020 1142   BUN 17 06/25/2018 0000   CREATININE 1.31 (H) 12/29/2020 1347   CALCIUM 10.0 10/18/2020 1142   PROT 7.4 10/18/2020 1142   ALBUMIN 4.1 10/18/2020 1142   AST 28 10/18/2020  1142   ALT 22 10/18/2020 1142   ALKPHOS 69 10/18/2020 1142   BILITOT 0.8 10/18/2020 1142   GFRNONAA 47 (L) 09/14/2020 1526   GFRAA 40 (L) 09/04/2013 1559   Lab Results  Component Value Date   CHOL 144 10/18/2020   HDL 62.50 10/18/2020   LDLCALC 63 10/18/2020   TRIG 92.0 10/18/2020   CHOLHDL 2 10/18/2020   Lab Results  Component Value Date   HGBA1C 5.3 10/30/2017   Lab Results  Component Value Date   VITAMINB12 508 03/16/2020   Lab Results  Component Value Date   TSH 2.13 01/08/2020      ASSESSMENT AND PLAN 80 y.o. year old female  has a past medical history of Allergic rhinitis, Anemia, Anxiety, Bronchiectasis, Carpal tunnel syndrome (07/13/2015), CVA (cerebral infarction) (10/2007), Diverticulosis of colon, Gait disorder, GERD (gastroesophageal reflux disease), History of breast cancer, HTN (hypertension), Hyperlipidemia, Idiopathic pulmonary fibrosis, Left knee DJD, Migraine, Osteoporosis, Peripheral neuropathy, PMR (polymyalgia rheumatica) ([place]), Polyneuropathy in other diseases classified elsewhere ([place]) (02/17/2013), Previous back surgery (10/09/12), Renal insufficiency, Rheumatoid arthritis ([place]), Seizure disorder ([place]), Spondylosis, cervical, with myelopathy (08/31/2015), Temporal arteritis ([place]), Type II or unspecified type diabetes mellitus without mention of complication, not stated as uncontrolled, and Vitamin D deficiency. here with ***   I spent 15 minutes with the patient. 50% of this time was spent   Butler Denmark, [place], DNP 04/10/2021, 5:43 AM [facility] [address] [place], [place] [address] [phone]

## 2021-04-11 DIAGNOSIS — K651 Peritoneal abscess: Secondary | ICD-10-CM | POA: Diagnosis not present

## 2021-04-17 DIAGNOSIS — I700 Atherosclerosis of aorta: Secondary | ICD-10-CM | POA: Diagnosis not present

## 2021-04-17 DIAGNOSIS — N281 Cyst of kidney, acquired: Secondary | ICD-10-CM | POA: Diagnosis not present

## 2021-04-17 DIAGNOSIS — K651 Peritoneal abscess: Secondary | ICD-10-CM | POA: Diagnosis not present

## 2021-04-17 DIAGNOSIS — M4325 Fusion of spine, thoracolumbar region: Secondary | ICD-10-CM | POA: Diagnosis not present

## 2021-04-17 DIAGNOSIS — K5730 Diverticulosis of large intestine without perforation or abscess without bleeding: Secondary | ICD-10-CM | POA: Diagnosis not present

## 2021-04-18 ENCOUNTER — Other Ambulatory Visit: Payer: Self-pay | Admitting: Neurology

## 2021-04-18 ENCOUNTER — Other Ambulatory Visit: Payer: Self-pay | Admitting: Family Medicine

## 2021-04-19 DIAGNOSIS — K651 Peritoneal abscess: Secondary | ICD-10-CM | POA: Diagnosis not present

## 2021-05-03 DIAGNOSIS — K651 Peritoneal abscess: Secondary | ICD-10-CM | POA: Diagnosis not present

## 2021-06-05 ENCOUNTER — Other Ambulatory Visit: Payer: Self-pay | Admitting: Family Medicine

## 2021-06-05 MED ORDER — FUROSEMIDE 40 MG PO TABS: 40.0000 mg | ORAL_TABLET | Freq: Every day | ORAL | 0 refills | Status: DC

## 2021-06-05 MED ORDER — GABAPENTIN 300 MG PO CAPS: 600.0000 mg | ORAL_CAPSULE | Freq: Two times a day (BID) | ORAL | 0 refills | Status: DC

## 2021-06-05 MED ORDER — OXYCODONE HCL ER 10 MG PO T12A: 10.0000 mg | EXTENDED_RELEASE_TABLET | Freq: Two times a day (BID) | ORAL | 0 refills | Status: DC

## 2021-06-05 NOTE — Telephone Encounter (Signed)
PT called to request the following meds to be refill through [facility]:  furosemide (LASIX) 40 MG tablet  oxyCODONE (OXYCONTIN) 10 mg 12 hr tablet  gabapentin (NEURONTIN) 300 MG capsule

## 2021-06-05 NOTE — Telephone Encounter (Signed)
Provided 1 month refill until follow-up.

## 2021-06-05 NOTE — Telephone Encounter (Signed)
Last filled 02/03/2021 Last OV 11/14/2020  Ok to fill?

## 2021-06-12 ENCOUNTER — Ambulatory Visit (INDEPENDENT_AMBULATORY_CARE_PROVIDER_SITE_OTHER): Payer: Medicare Other | Admitting: Family Medicine

## 2021-06-12 ENCOUNTER — Other Ambulatory Visit: Payer: Self-pay

## 2021-06-12 VITALS — BP 152/70 | HR 63 | Temp 98.5°F | Wt 130.5 lb

## 2021-06-12 DIAGNOSIS — I10 Essential (primary) hypertension: Secondary | ICD-10-CM | POA: Diagnosis not present

## 2021-06-12 DIAGNOSIS — R300 Dysuria: Secondary | ICD-10-CM

## 2021-06-12 DIAGNOSIS — R5383 Other fatigue: Secondary | ICD-10-CM | POA: Diagnosis not present

## 2021-06-12 DIAGNOSIS — Z23 Encounter for immunization: Secondary | ICD-10-CM

## 2021-06-12 DIAGNOSIS — R634 Abnormal weight loss: Secondary | ICD-10-CM

## 2021-06-12 LAB — POCT URINALYSIS DIPSTICK
Bilirubin, UA: NEGATIVE
Blood, UA: NEGATIVE
Glucose, UA: NEGATIVE
Ketones, UA: NEGATIVE
Nitrite, UA: NEGATIVE
Protein, UA: NEGATIVE
Spec Grav, UA: 1.015 (ref 1.010–1.025)
Urobilinogen, UA: 0.2 E.U./dL
pH, UA: 6 (ref 5.0–8.0)

## 2021-06-12 MED ORDER — OXYCODONE HCL ER 10 MG PO T12A: 10.0000 mg | EXTENDED_RELEASE_TABLET | Freq: Two times a day (BID) | ORAL | 0 refills | Status: DC

## 2021-06-12 MED ORDER — OXYCODONE HCL ER 10 MG PO T12A: EXTENDED_RELEASE_TABLET | ORAL | 0 refills | Status: DC

## 2021-06-12 NOTE — Progress Notes (Signed)
Established Patient Office Visit  Subjective:  Patient ID: [name], female    DOB: 12-11-1940  Age: 80 y.o. MRN: [id number]  CC:  Chief Complaint  Patient presents with   Urinary Tract Infection    In and out of the hospital since may for a ruptured appendix, had a cath, dysuria while in hospital, given 2 rounds of antibiotic cipro and ???, still having some dysuria    [name] presents for hospital follow-up.  Patient was down in [place] in May with several family members.  On the way back they were driving through [place] and she became extremely fatigued and basically became unresponsive in the backseat.  They pulled over to a convenience store and called 911.  She was taken to local hospital and diagnosed with ruptured appendix.  She was initially hospitalized in [place] and then eventually transferred to [facility] to be closer to her son.  She stayed in rehab until September 8.  She has had extensive physical therapy.  She had a lot of weight loss but appetite is gradually improving.  She does relate some mild burning with urination but has had somewhat chronic symptoms of this.  She has not a Foley catheter now in a couple months.  No recent fever.  No abdominal pain.  Is trying to eat more but still having to work back into her normal appetite.  Past Medical History:  Diagnosis Date   Allergic rhinitis    Anemia    Anxiety    Bronchiectasis    Carpal tunnel syndrome 07/13/2015   Bilateral   CVA (cerebral infarction) 10/2007   Right thalamic    Diverticulosis of colon    Gait disorder    GERD (gastroesophageal reflux disease)    History of breast cancer    HTN (hypertension)    Hyperlipidemia    Idiopathic pulmonary fibrosis    Left knee DJD    Migraine    Osteoporosis    Peripheral neuropathy    PMR (polymyalgia rheumatica) (HCC)    Polyneuropathy in other diseases classified elsewhere ([place]) 02/17/2013   Previous back surgery 10/09/12    Renal insufficiency    Rheumatoid arthritis (HCC)    Seizure disorder (HCC)    Spondylosis, cervical, with myelopathy 08/31/2015   C3-4 myelopathy   Temporal arteritis (HCC)    Right eye blind, on steroids per [facility]/ Dr [name]   Type II or unspecified type diabetes mellitus without mention of complication, not stated as uncontrolled    2nd to steriods   Vitamin D deficiency     Past Surgical History:  Procedure Laterality Date   CARPAL TUNNEL RELEASE Right    CATARACT EXTRACTION     OS - Summer 2010   CERVICAL FUSION  2010   4 rods and pins in place   CHOLECYSTECTOMY     COLONOSCOPY  12/15/2011   Procedure: COLONOSCOPY;  Surgeon: [name], MD;  Location: [facility];  Service: Endoscopy;  Laterality: N/A;   LUMBAR FUSION  04/2010   W/Mechanical fixation - [facility]   LUMBAR FUSION  2013   rods in hips (to stabilize).   MASTECTOMY     NECK SURGERY     rheumatoid nodule removal     SPINAL FUSION  07/2010   T10-L2 interbody fusion / [facility]   TONSILLECTOMY     TOTAL KNEE ARTHROPLASTY     Left    Family History  Problem Relation Age of  Onset   Brain cancer Mother    Hypertension Mother    Arthritis Mother    Dementia Father    Hypertension Father    Arthritis Father    Breast cancer Sister    Lung cancer Sister    Lung disease Neg Hx    Rheumatologic disease Neg Hx     Social History   Socioeconomic History   Marital status: Widowed    Spouse name: Not on file   Number of children: 1   Years of education: 12+ coll.   Highest education level: Not on file  Occupational History   Occupation: Education officer, environmental: RETIRED    Comment: retired  Tobacco Use   Smoking status: Never   Smokeless tobacco: Never   Tobacco comments:    Father   Vaping Use   Vaping Use: Never used  Substance and Sexual Activity   Alcohol use: No    Alcohol/week: 0.0 standard drinks    Comment: heavy drinker until  1995 - sobriety with AA   Drug use: No   Sexual activity: Not Currently  Other Topics Concern   Not on file  Social History Narrative   Lives alone   Right handed   Drinks 6-8 cups caffeine daily      Social Determinants of Health   Financial Resource Strain: Not on file  Food Insecurity: Not on file  Transportation Needs: Not on file  Physical Activity: Not on file  Stress: Not on file  Social Connections: Moderately Integrated   Frequency of Communication with Friends and Family: More than three times a week   Frequency of Social Gatherings with Friends and Family: Once a week   Attends Religious Services: 1 to 4 times per year   Active Member of [employer] or Organizations: Yes   Attends Archivist Meetings: More than 4 times per year   Marital Status: Widowed  Human resources officer Violence: Not on file    Outpatient Medications Prior to Visit  Medication Sig Dispense Refill   amLODipine (NORVASC) 5 MG tablet TAKE 1 TABLET DAILY 90 tablet 3   Calcium Carb-Cholecalciferol (CALCIUM CARBONATE-VITAMIN D3 PO) Take 1 tablet by mouth daily     cholecalciferol (VITAMIN D) 1000 UNITS tablet Take 1,000 Units by mouth daily.     denosumab (PROLIA) 60 MG/ML SOLN injection Inject 60 mg into the skin every 6 (six) months.     diclofenac sodium (VOLTAREN) 1 % GEL APPLY 2 GRAMS TOPICALLY FOUR TIMES A DAY 400 g 6   ferrous sulfate 325 (65 FE) MG tablet Take 325 mg by mouth 2 (two) times daily.     furosemide (LASIX) 40 MG tablet Take 1 tablet (40 mg total) by mouth daily. 90 tablet 0   gabapentin (NEURONTIN) 300 MG capsule Take 2 capsules (600 mg total) by mouth 2 (two) times daily. 360 capsule 0   Golimumab (SIM[place]ONI ARIA IV) Inject into the vein. Takes infusion every 8 wks.     Multiple Vitamins-Minerals (MULTIVITAMIN,TX-MINERALS) tablet Take 1 tablet by mouth daily.     oxyCODONE (OXYCONTIN) 10 mg 12 hr tablet Take 1 tablet by mouth every 12 hours.  May refill in 2 months 60 tablet 0    oxyCODONE (OXYCONTIN) 10 mg 12 hr tablet Take one tablet by mouth every 12 hours. 10 tablet 0   oxyCODONE (OXYCONTIN) 10 mg 12 hr tablet Take 1 tablet (10 mg total) by mouth every 12 (twelve) hours. [place]  tablet 0   Oxycodone HCl 10 MG TABS Take 1 tablet (10 mg total) by mouth every 6 (six) hours as needed. Take 1 tablet by mouth every 6 hours as needed for breakthrough pain 90 tablet 0   potassium chloride (KLOR-CON) 10 MEQ tablet TAKE 1 TABLET TWICE A DAY 180 tablet 1   predniSONE (DELTASONE) 1 MG tablet TAKE 4 TABLETS DAILY WITH BREAKFAST 360 tablet 3   predniSONE (DELTASONE) 10 MG tablet Begin 5 tablets a day, taper by one tablet each week 90 tablet 2   simvastatin (ZOCOR) 5 MG tablet TAKE 1 TABLET AT BEDTIME 90 tablet 1   tizanidine (ZANAFLEX) 2 MG capsule Take 1 capsule (2 mg total) by mouth as needed for muscle spasms (take 1 at bedtime as needed for muscle spasms). 30 capsule 3   No facility-administered medications prior to visit.    Allergies  Allergen Reactions   Hydromorphone Other (See Comments)    Cognitive changes  "made me unconscious"    ROS Review of Systems  Constitutional:  Negative for chills and fever.  Gastrointestinal:  Negative for abdominal pain, nausea and vomiting.  Genitourinary:  Positive for dysuria.     Objective:    Physical Exam Vitals reviewed.  Cardiovascular:     Rate and Rhythm: Normal rate and regular rhythm.  Pulmonary:     Effort: Pulmonary effort is normal.     Breath sounds: Normal breath sounds.  Abdominal:     Palpations: Abdomen is soft.     Tenderness: There is no abdominal tenderness.  Musculoskeletal:     Right lower leg: No edema.     Left lower leg: No edema.    BP (!) 152/70 (BP Location: Left Arm, Patient Position: Sitting, Cuff Size: Normal)   Pulse 63   Temp 98.5 F (36.9 C) (Oral)   Wt 130 lb 8 oz (59.2 kg)   SpO2 98%   BMI 24.66 kg/m  Wt Readings from Last 3 Encounters:  06/12/21 130 lb 8 oz (59.2 kg)  01/03/21  138 lb (62.6 kg)  12/29/20 137 lb (62.1 kg)     Health Maintenance Due  Topic Date Due   OPHTHALMOLOGY EXAM  Never done   Zoster Vaccines- Shingrix (1 of 2) Never done   FOOT EXAM  04/26/2018   HEMOGLOBIN A1C  04/29/2018   COVID-19 Vaccine (3 - Pfizer risk series) 12/27/2019   INFLUENZA VACCINE  04/24/2021    There are no preventive care reminders to display for this patient.  Lab Results  Component Value Date   TSH 2.13 01/08/2020   Lab Results  Component Value Date   WBC 7.6 09/14/2020   HGB 13.5 09/14/2020   HCT 40.9 09/14/2020   MCV 98.8 09/14/2020   PLT 232 09/14/2020   Lab Results  Component Value Date   NA 140 10/18/2020   K 4.6 10/18/2020   CO2 34 (H) 10/18/2020   GLUCOSE 100 (H) 10/18/2020   BUN 24 (H) 10/18/2020   CREATININE 1.31 (H) 12/29/2020   BILITOT 0.8 10/18/2020   ALKPHOS 69 10/18/2020   AST 28 10/18/2020   ALT 22 10/18/2020   PROT 7.4 10/18/2020   ALBUMIN 4.1 10/18/2020   CALCIUM 10.0 10/18/2020   ANIONGAP 10 09/14/2020   EGFR 41 (L) 12/29/2020   GFR 43.20 (L) 10/18/2020   Lab Results  Component Value Date   CHOL 144 10/18/2020   Lab Results  Component Value Date   HDL 62.50 10/18/2020   Lab Results  Component Value Date   LDLCALC 63 10/18/2020   Lab Results  Component Value Date   TRIG 92.0 10/18/2020   Lab Results  Component Value Date   CHOLHDL 2 10/18/2020   Lab Results  Component Value Date   HGBA1C 5.3 10/30/2017      Assessment & Plan:   #1 history of recent ruptured appendix as above.  Patient never had surgery per report.  Have not had time yet to review all of her hospital records.  She was treated with prolonged antibiotics and had prolonged hospitalization followed by a long rehab stay.  She feels like she is slowly recovering with regard to appetite.  She has home physical therapy set up already.  -Recheck CBC and comprehensive metabolic panel  #2 hypertension.  Slightly up today.  Has been fairly well  controlled in the past -monitor over next few weeks and needs further intervention if remaining up over 140/90.    #3 chronic pain related to chronic cervicalgia.  She has been on OxyContin for years.  We recently provided 1 refill and will provide remainder of refills for the next 3 months.   No orders of the defined types were placed in this encounter.   Follow-up: Return in about 3 months (around 09/11/2021).    Carolann Littler, MD

## 2021-06-13 DIAGNOSIS — K5790 Diverticulosis of intestine, part unspecified, without perforation or abscess without bleeding: Secondary | ICD-10-CM | POA: Diagnosis not present

## 2021-06-13 DIAGNOSIS — R32 Unspecified urinary incontinence: Secondary | ICD-10-CM | POA: Diagnosis not present

## 2021-06-13 DIAGNOSIS — E559 Vitamin D deficiency, unspecified: Secondary | ICD-10-CM | POA: Diagnosis not present

## 2021-06-13 DIAGNOSIS — K5900 Constipation, unspecified: Secondary | ICD-10-CM | POA: Diagnosis not present

## 2021-06-13 DIAGNOSIS — K219 Gastro-esophageal reflux disease without esophagitis: Secondary | ICD-10-CM | POA: Diagnosis not present

## 2021-06-13 DIAGNOSIS — J8410 Pulmonary fibrosis, unspecified: Secondary | ICD-10-CM | POA: Diagnosis not present

## 2021-06-13 DIAGNOSIS — M069 Rheumatoid arthritis, unspecified: Secondary | ICD-10-CM | POA: Diagnosis not present

## 2021-06-13 DIAGNOSIS — M5136 Other intervertebral disc degeneration, lumbar region: Secondary | ICD-10-CM | POA: Diagnosis not present

## 2021-06-13 DIAGNOSIS — E785 Hyperlipidemia, unspecified: Secondary | ICD-10-CM | POA: Diagnosis not present

## 2021-06-13 DIAGNOSIS — A0472 Enterocolitis due to Clostridium difficile, not specified as recurrent: Secondary | ICD-10-CM | POA: Diagnosis not present

## 2021-06-13 DIAGNOSIS — K3532 Acute appendicitis with perforation and localized peritonitis, without abscess: Secondary | ICD-10-CM | POA: Diagnosis not present

## 2021-06-13 DIAGNOSIS — I10 Essential (primary) hypertension: Secondary | ICD-10-CM | POA: Diagnosis not present

## 2021-06-13 DIAGNOSIS — H5461 Unqualified visual loss, right eye, normal vision left eye: Secondary | ICD-10-CM | POA: Diagnosis not present

## 2021-06-13 DIAGNOSIS — D649 Anemia, unspecified: Secondary | ICD-10-CM | POA: Diagnosis not present

## 2021-06-14 LAB — URINE CULTURE
MICRO NUMBER:: 12392331
SPECIMEN QUALITY:: ADEQUATE

## 2021-06-14 NOTE — Progress Notes (Signed)
Pt. Called into office and results were verbalized she stated even though the results are negative she still experiencing burning during urination and would like to know if there are any suggestion to help with the burning?

## 2021-06-15 DIAGNOSIS — M069 Rheumatoid arthritis, unspecified: Secondary | ICD-10-CM | POA: Diagnosis not present

## 2021-06-15 DIAGNOSIS — I10 Essential (primary) hypertension: Secondary | ICD-10-CM | POA: Diagnosis not present

## 2021-06-15 DIAGNOSIS — K5790 Diverticulosis of intestine, part unspecified, without perforation or abscess without bleeding: Secondary | ICD-10-CM | POA: Diagnosis not present

## 2021-06-15 DIAGNOSIS — K5900 Constipation, unspecified: Secondary | ICD-10-CM | POA: Diagnosis not present

## 2021-06-15 DIAGNOSIS — R32 Unspecified urinary incontinence: Secondary | ICD-10-CM | POA: Diagnosis not present

## 2021-06-15 DIAGNOSIS — K3532 Acute appendicitis with perforation and localized peritonitis, without abscess: Secondary | ICD-10-CM | POA: Diagnosis not present

## 2021-06-16 ENCOUNTER — Telehealth: Payer: Self-pay | Admitting: Family Medicine

## 2021-06-16 NOTE — Telephone Encounter (Signed)
Spoke with patient, informed of message.  Will decided if willing to schedule an virtual appointment.

## 2021-06-16 NOTE — Telephone Encounter (Signed)
Patient called because she had previous appointment with Dr.Burchette about possible UTI problems she was having. Patient states urine culture came back normal so she wants to know her next steps on taking care of this problem. Asked patient if she wanted to go ahead and schedule an appointment but she would like a call back first.      Good callback number is [phone]

## 2021-06-16 NOTE — Telephone Encounter (Signed)
I would set up an OV with Dr. Elease Hashimoto to discuss this

## 2021-06-16 NOTE — Telephone Encounter (Signed)
Last office visit- 06/12/21.  Please advise

## 2021-06-19 ENCOUNTER — Other Ambulatory Visit: Payer: Self-pay

## 2021-06-19 ENCOUNTER — Telehealth: Payer: Self-pay | Admitting: Family Medicine

## 2021-06-19 ENCOUNTER — Ambulatory Visit (INDEPENDENT_AMBULATORY_CARE_PROVIDER_SITE_OTHER): Payer: Medicare Other | Admitting: Otolaryngology

## 2021-06-19 DIAGNOSIS — H6123 Impacted cerumen, bilateral: Secondary | ICD-10-CM

## 2021-06-19 DIAGNOSIS — M069 Rheumatoid arthritis, unspecified: Secondary | ICD-10-CM | POA: Diagnosis not present

## 2021-06-19 DIAGNOSIS — H6501 Acute serous otitis media, right ear: Secondary | ICD-10-CM | POA: Diagnosis not present

## 2021-06-19 DIAGNOSIS — R32 Unspecified urinary incontinence: Secondary | ICD-10-CM | POA: Diagnosis not present

## 2021-06-19 DIAGNOSIS — K3532 Acute appendicitis with perforation and localized peritonitis, without abscess: Secondary | ICD-10-CM | POA: Diagnosis not present

## 2021-06-19 DIAGNOSIS — K5900 Constipation, unspecified: Secondary | ICD-10-CM | POA: Diagnosis not present

## 2021-06-19 DIAGNOSIS — K5790 Diverticulosis of intestine, part unspecified, without perforation or abscess without bleeding: Secondary | ICD-10-CM | POA: Diagnosis not present

## 2021-06-19 DIAGNOSIS — I10 Essential (primary) hypertension: Secondary | ICD-10-CM | POA: Diagnosis not present

## 2021-06-19 MED ORDER — FLUTICASONE PROPIONATE 50 MCG/ACT NA SUSP: 2.0000 | Freq: Every day | NASAL | 6 refills | Status: DC

## 2021-06-19 NOTE — Telephone Encounter (Signed)
[place] called to get verbal orders to continue with nursing: Once a week for 9 weeks. Please advise and call back at [phone].

## 2021-06-19 NOTE — Progress Notes (Signed)
HPI: [name] is a 80 y.o. female who returns today for evaluation of wax buildup in her ears.  She was last cleaned 8 or 9 years ago.  She was recently hospitalized.  She has some mild nasal congestion.  She has noticed decreased hearing predominantly in the right ear..  Past Medical History:  Diagnosis Date   Allergic rhinitis    Anemia    Anxiety    Bronchiectasis    Carpal tunnel syndrome 07/13/2015   Bilateral   CVA (cerebral infarction) 10/2007   Right thalamic    Diverticulosis of colon    Gait disorder    GERD (gastroesophageal reflux disease)    History of breast cancer    HTN (hypertension)    Hyperlipidemia    Idiopathic pulmonary fibrosis    Left knee DJD    Migraine    Osteoporosis    Peripheral neuropathy    PMR (polymyalgia rheumatica) (HCC)    Polyneuropathy in other diseases classified elsewhere ([place]) 02/17/2013   Previous back surgery 10/09/12   Renal insufficiency    Rheumatoid arthritis (HCC)    Seizure disorder (HCC)    Spondylosis, cervical, with myelopathy 08/31/2015   C3-4 myelopathy   Temporal arteritis (HCC)    Right eye blind, on steroids per [facility]/ Dr [name]   Type II or unspecified type diabetes mellitus without mention of complication, not stated as uncontrolled    2nd to steriods   Vitamin D deficiency    Past Surgical History:  Procedure Laterality Date   CARPAL TUNNEL RELEASE Right    CATARACT EXTRACTION     OS - Summer 2010   CERVICAL FUSION  2010   4 rods and pins in place   CHOLECYSTECTOMY     COLONOSCOPY  12/15/2011   Procedure: COLONOSCOPY;  Surgeon: [name], MD;  Location: [facility];  Service: Endoscopy;  Laterality: N/A;   LUMBAR FUSION  04/2010   W/Mechanical fixation - [facility]   LUMBAR FUSION  2013   rods in hips (to stabilize).   MASTECTOMY     NECK SURGERY     rheumatoid nodule removal     SPINAL FUSION  07/2010   T10-L2 interbody fusion / [facility]   TONSILLECTOMY     TOTAL KNEE  ARTHROPLASTY     Left   Social History   Socioeconomic History   Marital status: Widowed    Spouse name: Not on file   Number of children: 1   Years of education: 12+ coll.   Highest education level: Not on file  Occupational History   Occupation: Education officer, environmental: RETIRED    Comment: retired  Tobacco Use   Smoking status: Never   Smokeless tobacco: Never   Tobacco comments:    Father   Vaping Use   Vaping Use: Never used  Substance and Sexual Activity   Alcohol use: No    Alcohol/week: 0.0 standard drinks    Comment: heavy drinker until 1995 - sobriety with AA   Drug use: No   Sexual activity: Not Currently  Other Topics Concern   Not on file  Social History Narrative   Lives alone   Right handed   Drinks 6-8 cups caffeine daily      Social Determinants of Health   Financial Resource Strain: Not on file  Food Insecurity: Not on file  Transportation Needs: Not on file  Physical Activity: Not on file  Stress: Not  on file  Social Connections: Not on file   Family History  Problem Relation Age of Onset   Brain cancer Mother    Hypertension Mother    Arthritis Mother    Dementia Father    Hypertension Father    Arthritis Father    Breast cancer Sister    Lung cancer Sister    Lung disease Neg Hx    Rheumatologic disease Neg Hx    Allergies  Allergen Reactions   Hydromorphone Other (See Comments)    Cognitive changes  "made me unconscious"   Prior to Admission medications   Medication Sig Start Date End Date Taking? Authorizing Provider  amLODipine (NORVASC) 5 MG tablet TAKE 1 TABLET DAILY 02/26/20   Burchette, Alinda Sierras, MD  Calcium Carb-Cholecalciferol (CALCIUM CARBONATE-VITAMIN D3 PO) Take 1 tablet by mouth daily    [provider]  cholecalciferol (VITAMIN D) 1000 UNITS tablet Take 1,000 Units by mouth daily.    [provider]  denosumab (PROLIA) 60 MG/ML SOLN injection Inject 60 mg into the  skin every 6 (six) months.    [provider]  diclofenac sodium (VOLTAREN) 1 % GEL APPLY 2 GRAMS TOPICALLY FOUR TIMES A DAY 05/12/19   Kathrynn Ducking, MD  ferrous sulfate 325 (65 FE) MG tablet Take 325 mg by mouth 2 (two) times daily.    [provider]  furosemide (LASIX) 40 MG tablet Take 1 tablet (40 mg total) by mouth daily. 06/05/21   Burchette, Alinda Sierras, MD  gabapentin (NEURONTIN) 300 MG capsule Take 2 capsules (600 mg total) by mouth 2 (two) times daily. 06/05/21   Burchette, Alinda Sierras, MD  Golimumab ([place] ARIA IV) Inject into the vein. Takes infusion every 8 wks.    [provider]  Multiple Vitamins-Minerals (MULTIVITAMIN,TX-MINERALS) tablet Take 1 tablet by mouth daily.    [provider]  oxyCODONE (OXYCONTIN) 10 mg 12 hr tablet Take one tablet by mouth every 12 hours. 02/03/21   Laurey Morale, MD  oxyCODONE (OXYCONTIN) 10 mg 12 hr tablet Take 1 tablet by mouth every 12 hours.  May refill in 2 months 06/12/21   Eulas Post, MD  oxyCODONE (OXYCONTIN) 10 mg 12 hr tablet Take 1 tablet (10 mg total) by mouth every 12 (twelve) hours. 06/12/21   Burchette, Alinda Sierras, MD  Oxycodone HCl 10 MG TABS Take 1 tablet (10 mg total) by mouth every 6 (six) hours as needed. Take 1 tablet by mouth every 6 hours as needed for breakthrough pain 10/18/20   Eulas Post, MD  potassium chloride (KLOR-CON) 10 MEQ tablet TAKE 1 TABLET TWICE A DAY 04/19/21   Burchette, Alinda Sierras, MD  predniSONE (DELTASONE) 1 MG tablet TAKE 4 TABLETS DAILY WITH BREAKFAST 03/23/20   Kathrynn Ducking, MD  predniSONE (DELTASONE) 10 MG tablet Begin 5 tablets a day, taper by one tablet each week 01/03/21   Kathrynn Ducking, MD  simvastatin (ZOCOR) 5 MG tablet TAKE 1 TABLET AT BEDTIME 04/19/21   Burchette, Alinda Sierras, MD  tizanidine (ZANAFLEX) 2 MG capsule Take 1 capsule (2 mg total) by mouth as needed for muscle spasms (take 1 at bedtime as needed for muscle spasms). 01/05/20   Kathrynn Ducking, MD   potassium chloride (K-DUR) 10 MEQ tablet Take 1 tablet (10 mEq total) by mouth 2 (two) times daily. 12/29/12 07/13/20  Norins, Heinz Knuckles, MD     Positive ROS: Otherwise negative  All other systems have been reviewed and were  otherwise negative with the exception of those mentioned in the HPI and as above.  Physical Exam: Constitutional: Alert, well-appearing, no acute distress Ears: External ears without lesions or tenderness.  She had minimal wax buildup in both ear canals that was nonobstructing that was cleaned in the office using curette.  The left TM was clear the right TM with a minimal serous otitis and some air bubbles. Nasal: External nose without lesions. Septum with minimal deformity and mild rhinitis.. Clear nasal passages Oral: Lips and gums without lesions. Tongue and palate mucosa without lesions. Posterior oropharynx clear. Neck: No palpable adenopathy or masses Respiratory: Breathing comfortably  Skin: No facial/neck lesions or rash noted.  Cerumen impaction removal  Date/Time: 06/19/2021 1:09 PM Performed by: Rozetta Nunnery, MD Authorized by: Rozetta Nunnery, MD   Consent:    Consent obtained:  Verbal   Consent given by:  Patient   Risks discussed:  Pain and bleeding Procedure details:    Location:  L ear and R ear   Procedure type: curette   Post-procedure details:    Inspection:  TM intact and canal normal   Hearing quality:  Improved   Procedure completion:  Tolerated well, no immediate complications Comments:     Minimal wax buildup in both ear canals that was cleaned with a curette.  She has a right serous otitis with air bubbles.  Assessment: Wax buildup in both ear canals. Right serous otitis media  Plan: Recommended use of Flonase 2 sprays each nostril at night for the next couple weeks or until the ear clears.   Radene Journey, MD

## 2021-06-19 NOTE — Telephone Encounter (Signed)
Ok for orders? 

## 2021-06-20 ENCOUNTER — Ambulatory Visit: Payer: TRICARE For Life (TFL)

## 2021-06-20 ENCOUNTER — Telehealth: Payer: Self-pay | Admitting: Family Medicine

## 2021-06-20 NOTE — Telephone Encounter (Signed)
Dental Treatment Clearance form to be filled out--placed in dr's folder.  Call patient for pickup at [phone] and also mail to Dr. Mirna Mires to address on form.

## 2021-06-20 NOTE — Telephone Encounter (Signed)
Orders have been given.  

## 2021-06-20 NOTE — Telephone Encounter (Signed)
Please okay these orders  ?

## 2021-06-20 NOTE — Telephone Encounter (Signed)
Forms have been placed in Dr. Burchette's red folder.  

## 2021-06-21 ENCOUNTER — Telehealth: Payer: Self-pay | Admitting: Family Medicine

## 2021-06-21 NOTE — Telephone Encounter (Signed)
Please review in Dr. Erick Blinks absence.  [place] for orders?

## 2021-06-21 NOTE — Telephone Encounter (Signed)
Noted.  Will send this back to Dr. Erick Blinks basket for when he returns.

## 2021-06-21 NOTE — Telephone Encounter (Signed)
Please hold this for Dr. Elease Hashimoto

## 2021-06-21 NOTE — Telephone Encounter (Signed)
[facility] called to request verbal orders for the PT for Home Health Physical Therapy. They would like to do it twice a week for 4 weeks and then once a week for 1 week. Please call at [phone] for clarification and verbal order.

## 2021-06-21 NOTE — Telephone Encounter (Signed)
Orders have been given. Nothing further needed.  °

## 2021-06-22 ENCOUNTER — Ambulatory Visit (INDEPENDENT_AMBULATORY_CARE_PROVIDER_SITE_OTHER): Payer: Medicare Other

## 2021-06-22 ENCOUNTER — Telehealth: Payer: Self-pay

## 2021-06-22 DIAGNOSIS — Z0000 Encounter for general adult medical examination without abnormal findings: Secondary | ICD-10-CM

## 2021-06-22 NOTE — Telephone Encounter (Signed)
Patient had call blocker unable to reach , patient called in directly.  L.Gyasi Hazzard,LPN

## 2021-06-22 NOTE — Patient Instructions (Signed)
[name] , Thank you for taking time to come for your Medicare Wellness Visit. I appreciate your ongoing commitment to your health goals. Please review the following plan we discussed and let me know if I can assist you in the future.   Screening recommendations/referrals: Colonoscopy: no longer required  Mammogram: no longer required  Bone Density: 07/22/2019 Recommended yearly ophthalmology/optometry visit for glaucoma screening and checkup Recommended yearly dental visit for hygiene and checkup  Vaccinations: Influenza vaccine: completed  Pneumococcal vaccine: completed series  Tdap vaccine: 02/27/2020 Shingles vaccine: will consider     Advanced directives: will provide copies   Conditions/risks identified: none   Next appointment: none    Preventive Care 35 Years and Older, Female Preventive care refers to lifestyle choices and visits with your health care provider that can promote health and wellness. What does preventive care include? A yearly physical exam. This is also called an annual well check. Dental exams once or twice a year. Routine eye exams. Ask your health care provider how often you should have your eyes checked. Personal lifestyle choices, including: Daily care of your teeth and gums. Regular physical activity. Eating a healthy diet. Avoiding tobacco and drug use. Limiting alcohol use. Practicing safe sex. Taking low-dose aspirin every day. Taking vitamin and mineral supplements as recommended by your health care provider. What happens during an annual well check? The services and screenings done by your health care provider during your annual well check will depend on your age, overall health, lifestyle risk factors, and family history of disease. Counseling  Your health care provider may ask you questions about your: Alcohol use. Tobacco use. Drug use. Emotional well-being. Home and relationship well-being. Sexual activity. Eating  habits. History of falls. Memory and ability to understand (cognition). Work and work Statistician. Reproductive health. Screening  You may have the following tests or measurements: Height, weight, and BMI. Blood pressure. Lipid and cholesterol levels. These may be checked every 5 years, or more frequently if you are over 69 years old. Skin check. Lung cancer screening. You may have this screening every year starting at age 56 if you have a 30-pack-year history of smoking and currently smoke or have quit within the past 15 years. Fecal occult blood test (FOBT) of the stool. You may have this test every year starting at age 8. Flexible sigmoidoscopy or colonoscopy. You may have a sigmoidoscopy every 5 years or a colonoscopy every 10 years starting at age 29. Hepatitis C blood test. Hepatitis B blood test. Sexually transmitted disease (STD) testing. Diabetes screening. This is done by checking your blood sugar (glucose) after you have not eaten for a while (fasting). You may have this done every 1-3 years. Bone density scan. This is done to screen for osteoporosis. You may have this done starting at age 28. Mammogram. This may be done every 1-2 years. Talk to your health care provider about how often you should have regular mammograms. Talk with your health care provider about your test results, treatment options, and if necessary, the need for more tests. Vaccines  Your health care provider may recommend certain vaccines, such as: Influenza vaccine. This is recommended every year. Tetanus, diphtheria, and acellular pertussis (Tdap, Td) vaccine. You may need a Td booster every 10 years. Zoster vaccine. You may need this after age 73. Pneumococcal 13-valent conjugate (PCV13) vaccine. One dose is recommended after age 58. Pneumococcal polysaccharide (PPSV23) vaccine. One dose is recommended after age 2. Talk to your health care provider about  which screenings and vaccines you need and how  often you need them. This information is not intended to replace advice given to you by your health care provider. Make sure you discuss any questions you have with your health care provider. Document Released: 10/07/2015 Document Revised: 05/30/2016 Document Reviewed: 07/12/2015 Elsevier Interactive Patient Education  2017 [place] Prevention in the Home Falls can cause injuries. They can happen to people of all ages. There are many things you can do to make your home safe and to help prevent falls. What can I do on the outside of my home? Regularly fix the edges of walkways and driveways and fix any cracks. Remove anything that might make you trip as you walk through a door, such as a raised step or threshold. Trim any bushes or trees on the path to your home. Use bright outdoor lighting. Clear any walking paths of anything that might make someone trip, such as rocks or tools. Regularly check to see if handrails are loose or broken. Make sure that both sides of any steps have handrails. Any raised decks and porches should have guardrails on the edges. Have any leaves, snow, or ice cleared regularly. Use sand or salt on walking paths during winter. Clean up any spills in your garage right away. This includes oil or grease spills. What can I do in the bathroom? Use night lights. Install grab bars by the toilet and in the tub and shower. Do not use towel bars as grab bars. Use non-skid mats or decals in the tub or shower. If you need to sit down in the shower, use a plastic, non-slip stool. Keep the floor dry. Clean up any water that spills on the floor as soon as it happens. Remove soap buildup in the tub or shower regularly. Attach bath mats securely with double-sided non-slip rug tape. Do not have throw rugs and other things on the floor that can make you trip. What can I do in the bedroom? Use night lights. Make sure that you have a light by your bed that is easy to  reach. Do not use any sheets or blankets that are too big for your bed. They should not hang down onto the floor. Have a firm chair that has side arms. You can use this for support while you get dressed. Do not have throw rugs and other things on the floor that can make you trip. What can I do in the kitchen? Clean up any spills right away. Avoid walking on wet floors. Keep items that you use a lot in easy-to-reach places. If you need to reach something above you, use a strong step stool that has a grab bar. Keep electrical cords out of the way. Do not use floor polish or wax that makes floors slippery. If you must use wax, use non-skid floor wax. Do not have throw rugs and other things on the floor that can make you trip. What can I do with my stairs? Do not leave any items on the stairs. Make sure that there are handrails on both sides of the stairs and use them. Fix handrails that are broken or loose. Make sure that handrails are as long as the stairways. Check any carpeting to make sure that it is firmly attached to the stairs. Fix any carpet that is loose or worn. Avoid having throw rugs at the top or bottom of the stairs. If you do have throw rugs, attach them to the floor with  carpet tape. Make sure that you have a light switch at the top of the stairs and the bottom of the stairs. If you do not have them, ask someone to add them for you. What else can I do to help prevent falls? Wear shoes that: Do not have high heels. Have rubber bottoms. Are comfortable and fit you well. Are closed at the toe. Do not wear sandals. If you use a stepladder: Make sure that it is fully opened. Do not climb a closed stepladder. Make sure that both sides of the stepladder are locked into place. Ask someone to hold it for you, if possible. Clearly mark and make sure that you can see: Any grab bars or handrails. First and last steps. Where the edge of each step is. Use tools that help you move  around (mobility aids) if they are needed. These include: Canes. Walkers. Scooters. Crutches. Turn on the lights when you go into a dark area. Replace any light bulbs as soon as they burn out. Set up your furniture so you have a clear path. Avoid moving your furniture around. If any of your floors are uneven, fix them. If there are any pets around you, be aware of where they are. Review your medicines with your doctor. Some medicines can make you feel dizzy. This can increase your chance of falling. Ask your doctor what other things that you can do to help prevent falls. This information is not intended to replace advice given to you by your health care provider. Make sure you discuss any questions you have with your health care provider. Document Released: 07/07/2009 Document Revised: 02/16/2016 Document Reviewed: 10/15/2014 Elsevier Interactive Patient Education  2017 [employer].

## 2021-06-22 NOTE — Progress Notes (Signed)
Subjective:   [name] is a 80 y.o. female who presents for Medicare Annual (Subsequent) preventive examination.  I connected with [name] today by telephone and verified that I am speaking with the correct person using two identifiers. Location patient: home Location provider: work Persons participating in the virtual visit: patient, provider.   I discussed the limitations, risks, security and privacy concerns of performing an evaluation and management service by telephone and the availability of in person appointments. I also discussed with the patient that there may be a patient responsible charge related to this service. The patient expressed understanding and verbally consented to this telephonic visit.    Interactive audio and video telecommunications were attempted between this provider and patient, however failed, due to patient having technical difficulties OR patient did not have access to video capability.  We continued and completed visit with audio only.    Review of Systems    N/A       Objective:    Today's Vitals   There is no height or weight on file to calculate BMI.  Advanced Directives 06/22/2021 09/14/2020 06/13/2020 12/30/2017 04/26/2017 09/03/2016 08/31/2015  Does Patient Have a Medical Advance Directive? Yes No Yes Yes Yes Yes Yes  Type of Paramedic of [place];Living will - [place];Living will [place];Living will - [place];Living will [place];Living will  Does patient want to make changes to medical advance directive? - - No - Patient declined - - - -  Copy of [place] in Chart? No - copy requested - No - copy requested No - copy requested - No - copy requested -  Would patient like information on creating a medical advance directive? - No - Patient declined - - - - -  Pre-existing out of facility DNR order (yellow form or  pink MOST form) - - - - - - -    Current Medications (verified) Outpatient Encounter Medications as of 06/22/2021  Medication Sig   amLODipine (NORVASC) 5 MG tablet TAKE 1 TABLET DAILY   Calcium Carb-Cholecalciferol (CALCIUM CARBONATE-VITAMIN D3 PO) Take 1 tablet by mouth daily   cholecalciferol (VITAMIN D) 1000 UNITS tablet Take 1,000 Units by mouth daily.   denosumab (PROLIA) 60 MG/ML SOLN injection Inject 60 mg into the skin every 6 (six) months.   diclofenac sodium (VOLTAREN) 1 % GEL APPLY 2 GRAMS TOPICALLY FOUR TIMES A DAY   ferrous sulfate 325 (65 FE) MG tablet Take 325 mg by mouth 2 (two) times daily.   fluticasone (FLONASE) 50 MCG/ACT nasal spray Place 2 sprays into both nostrils daily.   furosemide (LASIX) 40 MG tablet Take 1 tablet (40 mg total) by mouth daily.   gabapentin (NEURONTIN) 300 MG capsule Take 2 capsules (600 mg total) by mouth 2 (two) times daily.   Golimumab ([place] ARIA IV) Inject into the vein. Takes infusion every 8 wks.   Multiple Vitamins-Minerals (MULTIVITAMIN,TX-MINERALS) tablet Take 1 tablet by mouth daily.   oxyCODONE (OXYCONTIN) 10 mg 12 hr tablet Take one tablet by mouth every 12 hours.   oxyCODONE (OXYCONTIN) 10 mg 12 hr tablet Take 1 tablet by mouth every 12 hours.  May refill in 2 months   oxyCODONE (OXYCONTIN) 10 mg 12 hr tablet Take 1 tablet (10 mg total) by mouth every 12 (twelve) hours.   Oxycodone HCl 10 MG TABS Take 1 tablet (10 mg total) by mouth every 6 (six) hours as  needed. Take 1 tablet by mouth every 6 hours as needed for breakthrough pain   potassium chloride (KLOR-CON) 10 MEQ tablet TAKE 1 TABLET TWICE A DAY   predniSONE (DELTASONE) 1 MG tablet TAKE 4 TABLETS DAILY WITH BREAKFAST   predniSONE (DELTASONE) 10 MG tablet Begin 5 tablets a day, taper by one tablet each week (Patient not taking: Reported on 06/22/2021)   simvastatin (ZOCOR) 5 MG tablet TAKE 1 TABLET AT BEDTIME (Patient not taking: Reported on 06/22/2021)   tizanidine (ZANAFLEX) 2  MG capsule Take 1 capsule (2 mg total) by mouth as needed for muscle spasms (take 1 at bedtime as needed for muscle spasms). (Patient not taking: Reported on 06/22/2021)   [DISCONTINUED] potassium chloride (K-DUR) 10 MEQ tablet Take 1 tablet (10 mEq total) by mouth 2 (two) times daily.   No facility-administered encounter medications on file as of 06/22/2021.    Allergies (verified) Hydromorphone   History: Past Medical History:  Diagnosis Date   Allergic rhinitis    Anemia    Anxiety    Bronchiectasis    Carpal tunnel syndrome 07/13/2015   Bilateral   CVA (cerebral infarction) 10/2007   Right thalamic    Diverticulosis of colon    Gait disorder    GERD (gastroesophageal reflux disease)    History of breast cancer    HTN (hypertension)    Hyperlipidemia    Idiopathic pulmonary fibrosis    Left knee DJD    Migraine    Osteoporosis    Peripheral neuropathy    PMR (polymyalgia rheumatica) (HCC)    Polyneuropathy in other diseases classified elsewhere ([place]) 02/17/2013   Previous back surgery 10/09/12   Renal insufficiency    Rheumatoid arthritis (HCC)    Seizure disorder (HCC)    Spondylosis, cervical, with myelopathy 08/31/2015   C3-4 myelopathy   Temporal arteritis (HCC)    Right eye blind, on steroids per [facility]/ Dr [name]   Type II or unspecified type diabetes mellitus without mention of complication, not stated as uncontrolled    2nd to steriods   Vitamin D deficiency    Past Surgical History:  Procedure Laterality Date   APPENDECTOMY     CARPAL TUNNEL RELEASE Right    CATARACT EXTRACTION     OS - Summer 2010   CERVICAL FUSION  09/24/2008   4 rods and pins in place   CHOLECYSTECTOMY     COLONOSCOPY  12/15/2011   Procedure: COLONOSCOPY;  Surgeon: [name], MD;  Location: [facility];  Service: Endoscopy;  Laterality: N/A;   LUMBAR FUSION  04/24/2010   W/Mechanical fixation - [facility]   LUMBAR FUSION  09/25/2011   rods in hips (to stabilize).    MASTECTOMY     NECK SURGERY     rheumatoid nodule removal     SPINAL FUSION  07/25/2010   T10-L2 interbody fusion / [facility]   TONSILLECTOMY     TOTAL KNEE ARTHROPLASTY     Left   Family History  Problem Relation Age of Onset   Brain cancer Mother    Hypertension Mother    Arthritis Mother    Dementia Father    Hypertension Father    Arthritis Father    Breast cancer Sister    Lung cancer Sister    Lung disease Neg Hx    Rheumatologic disease Neg Hx    Social History   Socioeconomic History   Marital status: Widowed    Spouse name: Not on file  Number of children: 1   Years of education: 12+ coll.   Highest education level: Not on file  Occupational History   Occupation: Education officer, environmental: RETIRED    Comment: retired  Tobacco Use   Smoking status: Never   Smokeless tobacco: Never   Tobacco comments:    Father   Vaping Use   Vaping Use: Never used  Substance and Sexual Activity   Alcohol use: No    Alcohol/week: 0.0 standard drinks    Comment: heavy drinker until 1995 - sobriety with AA   Drug use: No   Sexual activity: Not Currently  Other Topics Concern   Not on file  Social History Narrative   Lives alone   Right handed   Drinks 6-8 cups caffeine daily      Social Determinants of Health   Financial Resource Strain: Low Risk    Difficulty of Paying Living Expenses: Not hard at all  Food Insecurity: No Food Insecurity   Worried About Charity fundraiser in the Last Year: Never true   [place] in the Last Year: Never true  Transportation Needs: No Transportation Needs   Lack of Transportation (Medical): No   Lack of Transportation (Non-Medical): No  Physical Activity: Inactive   Days of Exercise per Week: 0 days   Minutes of Exercise per Session: 0 min  Stress: No Stress Concern Present   Feeling of Stress : Not at all  Social Connections: Socially Isolated   Frequency of Communication with  Friends and Family: Three times a week   Frequency of Social Gatherings with Friends and Family: Three times a week   Attends Religious Services: Never   Active Member of Clubs or Organizations: No   Attends Archivist Meetings: Never   Marital Status: Widowed    Tobacco Counseling Counseling given: Not Answered Tobacco comments: Father    Clinical Intake:  Pre-visit preparation completed: Yes  Pain : No/denies pain     Nutritional Risks: None Diabetes: No  How often do you need to have someone help you when you read instructions, pamphlets, or other written materials from your doctor or pharmacy?: 1 - Never What is the last grade level you completed in school?: college  Diabetic?no  Interpreter Needed?: No  Information entered by :: L.Duan Scharnhorst,LPN   Activities of Daily Living No flowsheet data found.  Patient Care Team: Eulas Post, MD as PCP - General (Family Medicine) Kathrynn Ducking, MD (Neurology) Hennie Duos, MD as Consulting Physician (Rheumatology) Lavonna Monarch, MD as Consulting Physician (Dermatology)  Indicate any recent Medical Services you may have received from other than [facility] providers in the past year (date may be approximate).     Assessment:   This is a routine wellness examination for [place].  Hearing/Vision screen Vision Screening - Comments:: Annual eye exams wear glasses   Dietary issues and exercise activities discussed:     Goals Addressed   None    Depression Screen PHQ 2/9 Scores 06/22/2021 06/22/2021 06/13/2020 04/12/2020 11/19/2018 04/26/2017 12/31/2016  PHQ - 2 Score 0 0 0 0 0 0 0  PHQ- 9 Score - - 0 - 0 - -    Fall Risk Fall Risk  06/22/2021 08/22/2020 06/13/2020 04/12/2020 01/08/2020  Falls in the past year? 0 1 1 1 1   Number falls in past yr: 0 1 1 1 1   Injury with Fall? 0 0 0 1 1  Risk  for fall due to : Impaired balance/gait;Impaired mobility - Impaired balance/gait;Medication side effect History of  fall(s) History of fall(s);Impaired balance/gait  Risk for fall due to: Comment cane walker wheelchair - - - -  Follow up Falls evaluation completed - Falls evaluation completed;Falls prevention discussed Falls evaluation completed Falls evaluation completed    FALL RISK PREVENTION PERTAINING TO THE HOME:  Any stairs in or around the home? No  If so, are there any without handrails? No  Home free of loose throw rugs in walkways, pet beds, electrical cords, etc? Yes  Adequate lighting in your home to reduce risk of falls? Yes   ASSISTIVE DEVICES UTILIZED TO PREVENT FALLS:  Life alert? No  Use of a cane, walker or w/c? Yes  Grab bars in the bathroom? Yes  Shower chair or bench in shower? Yes  Elevated toilet seat or a handicapped toilet? Yes    Cognitive Function:  Normal cognitive status assessed by direct observation by this Nurse Health Advisor. No abnormalities found.     6CIT Screen 06/13/2020  What Year? 0 points  What month? 0 points  What time? 0 points  Count back from 20 0 points  Months in reverse 0 points  Repeat phrase 0 points  Total Score 0    Immunizations Immunization History  Administered Date(s) Administered   Fluad Quad(high Dose 65+) 06/30/2019, 07/13/2020, 06/12/2021   H1N1 10/19/2008   Influenza Split 07/20/2012   Influenza Whole 06/28/2006, 07/08/2007, 07/07/2008, 07/07/2009   Influenza, High Dose Seasonal PF 07/14/2013, 07/30/2017, 07/02/2018   Influenza,inj,Quad PF,6+ Mos 07/07/2014   Influenza-Unspecified 07/15/2015, 07/24/2016   PFIZER(Purple Top)SARS-COV-2 Vaccination 11/07/2019, 11/29/2019   Pneumococcal Conjugate-13 07/28/2013   Pneumococcal Polysaccharide-23 06/28/2006   Td 10/28/2003   Tdap 02/27/2020   Zoster, Live 07/07/2008    TDAP status: Up to date  Flu Vaccine status: Up to date  Pneumococcal vaccine status: Up to date  Covid-19 vaccine status: Completed vaccines  Qualifies for Shingles Vaccine? Yes   Zostavax completed  No   Shingrix Completed?: No.    Education has been provided regarding the importance of this vaccine. Patient has been advised to call insurance company to determine out of pocket expense if they have not yet received this vaccine. Advised may also receive vaccine at local pharmacy or Health Dept. Verbalized acceptance and understanding.  Screening Tests Health Maintenance  Topic Date Due   OPHTHALMOLOGY EXAM  Never done   Zoster Vaccines- Shingrix (1 of 2) Never done   FOOT EXAM  04/26/2018   HEMOGLOBIN A1C  04/29/2018   COVID-19 Vaccine (3 - Pfizer risk series) 12/27/2019   MAMMOGRAM  09/28/2021   TETANUS/TDAP  02/26/2030   INFLUENZA VACCINE  Completed   DEXA SCAN  Completed   HPV VACCINES  Aged Out    Health Maintenance  Health Maintenance Due  Topic Date Due   OPHTHALMOLOGY EXAM  Never done   Zoster Vaccines- Shingrix (1 of 2) Never done   FOOT EXAM  04/26/2018   HEMOGLOBIN A1C  04/29/2018   COVID-19 Vaccine (3 - Pfizer risk series) 12/27/2019    Colorectal cancer screening: No longer required.   Mammogram status: No longer required due to age.  Bone Density status: Completed 07/22/2019. Results reflect: Bone density results: OSTEOPOROSIS. Repeat every 2 years.  Lung Cancer Screening: (Low Dose CT Chest recommended if Age 78-80 years, 30 pack-year currently smoking OR have quit w/in 15years.) does not qualify.   Lung Cancer Screening Referral: n/a  Additional Screening:  Hepatitis C Screening: does not qualify;   Vision Screening: Recommended annual ophthalmology exams for early detection of glaucoma and other disorders of the eye. Is the patient up to date with their annual eye exam?  Yes  Who is the provider or what is the name of the office in which the patient attends annual eye exams? Dr.Lyles If pt is not established with a provider, would they like to be referred to a provider to establish care? No .   Dental Screening: Recommended annual dental exams for  proper oral hygiene  Community Resource Referral / Chronic Care Management: CRR required this visit?  No   CCM required this visit?  No      Plan:     I have personally reviewed and noted the following in the patient's chart:   Medical and social history Use of alcohol, tobacco or illicit drugs  Current medications and supplements including opioid prescriptions.  Functional ability and status Nutritional status Physical activity Advanced directives List of other physicians Hospitalizations, surgeries, and ER visits in previous 12 months Vitals Screenings to include cognitive, depression, and falls Referrals and appointments  In addition, I have reviewed and discussed with patient certain preventive protocols, quality metrics, and best practice recommendations. A written personalized care plan for preventive services as well as general preventive health recommendations were provided to patient.     Randel Pigg, LPN   [id number]   Nurse Notes: none

## 2021-06-23 DIAGNOSIS — M069 Rheumatoid arthritis, unspecified: Secondary | ICD-10-CM | POA: Diagnosis not present

## 2021-06-23 DIAGNOSIS — K3532 Acute appendicitis with perforation and localized peritonitis, without abscess: Secondary | ICD-10-CM | POA: Diagnosis not present

## 2021-06-23 DIAGNOSIS — K5790 Diverticulosis of intestine, part unspecified, without perforation or abscess without bleeding: Secondary | ICD-10-CM | POA: Diagnosis not present

## 2021-06-23 DIAGNOSIS — I10 Essential (primary) hypertension: Secondary | ICD-10-CM | POA: Diagnosis not present

## 2021-06-23 DIAGNOSIS — K5900 Constipation, unspecified: Secondary | ICD-10-CM | POA: Diagnosis not present

## 2021-06-23 DIAGNOSIS — R32 Unspecified urinary incontinence: Secondary | ICD-10-CM | POA: Diagnosis not present

## 2021-06-26 ENCOUNTER — Telehealth: Payer: Self-pay

## 2021-06-26 NOTE — Telephone Encounter (Signed)
Left message for patient to call back  

## 2021-06-26 NOTE — Telephone Encounter (Signed)
Forms have been completed and placed up front for pick up.

## 2021-06-26 NOTE — Telephone Encounter (Signed)
Noted  

## 2021-06-26 NOTE — Telephone Encounter (Signed)
Patient called asking for results and was informed and verbalized understanding.

## 2021-06-26 NOTE — Telephone Encounter (Signed)
Patient called requesting a call back when paper work is ready for pick up.

## 2021-06-27 DIAGNOSIS — K5900 Constipation, unspecified: Secondary | ICD-10-CM | POA: Diagnosis not present

## 2021-06-27 DIAGNOSIS — M069 Rheumatoid arthritis, unspecified: Secondary | ICD-10-CM | POA: Diagnosis not present

## 2021-06-27 DIAGNOSIS — I10 Essential (primary) hypertension: Secondary | ICD-10-CM | POA: Diagnosis not present

## 2021-06-27 DIAGNOSIS — K3532 Acute appendicitis with perforation and localized peritonitis, without abscess: Secondary | ICD-10-CM | POA: Diagnosis not present

## 2021-06-27 DIAGNOSIS — R32 Unspecified urinary incontinence: Secondary | ICD-10-CM | POA: Diagnosis not present

## 2021-06-27 DIAGNOSIS — K5790 Diverticulosis of intestine, part unspecified, without perforation or abscess without bleeding: Secondary | ICD-10-CM | POA: Diagnosis not present

## 2021-06-27 NOTE — Telephone Encounter (Signed)
Spoke with the patient. She stated she has already picked these up. Nothing further needed.

## 2021-06-28 DIAGNOSIS — K3532 Acute appendicitis with perforation and localized peritonitis, without abscess: Secondary | ICD-10-CM | POA: Diagnosis not present

## 2021-06-28 DIAGNOSIS — M069 Rheumatoid arthritis, unspecified: Secondary | ICD-10-CM | POA: Diagnosis not present

## 2021-06-28 DIAGNOSIS — K5900 Constipation, unspecified: Secondary | ICD-10-CM | POA: Diagnosis not present

## 2021-06-28 DIAGNOSIS — K5790 Diverticulosis of intestine, part unspecified, without perforation or abscess without bleeding: Secondary | ICD-10-CM | POA: Diagnosis not present

## 2021-06-28 DIAGNOSIS — R32 Unspecified urinary incontinence: Secondary | ICD-10-CM | POA: Diagnosis not present

## 2021-06-28 DIAGNOSIS — I10 Essential (primary) hypertension: Secondary | ICD-10-CM | POA: Diagnosis not present

## 2021-06-29 DIAGNOSIS — K3532 Acute appendicitis with perforation and localized peritonitis, without abscess: Secondary | ICD-10-CM | POA: Diagnosis not present

## 2021-06-29 DIAGNOSIS — R32 Unspecified urinary incontinence: Secondary | ICD-10-CM | POA: Diagnosis not present

## 2021-06-29 DIAGNOSIS — K5790 Diverticulosis of intestine, part unspecified, without perforation or abscess without bleeding: Secondary | ICD-10-CM | POA: Diagnosis not present

## 2021-06-29 DIAGNOSIS — M069 Rheumatoid arthritis, unspecified: Secondary | ICD-10-CM | POA: Diagnosis not present

## 2021-06-29 DIAGNOSIS — K5900 Constipation, unspecified: Secondary | ICD-10-CM | POA: Diagnosis not present

## 2021-06-29 DIAGNOSIS — I10 Essential (primary) hypertension: Secondary | ICD-10-CM | POA: Diagnosis not present

## 2021-07-03 ENCOUNTER — Encounter: Payer: Self-pay | Admitting: Neurology

## 2021-07-03 ENCOUNTER — Ambulatory Visit (INDEPENDENT_AMBULATORY_CARE_PROVIDER_SITE_OTHER): Payer: Medicare Other | Admitting: Neurology

## 2021-07-03 VITALS — BP 149/83 | HR 92 | Ht 61.0 in | Wt 129.0 lb

## 2021-07-03 DIAGNOSIS — G63 Polyneuropathy in diseases classified elsewhere: Secondary | ICD-10-CM

## 2021-07-03 DIAGNOSIS — M545 Low back pain, unspecified: Secondary | ICD-10-CM

## 2021-07-03 DIAGNOSIS — G8929 Other chronic pain: Secondary | ICD-10-CM | POA: Diagnosis not present

## 2021-07-03 DIAGNOSIS — M316 Other giant cell arteritis: Secondary | ICD-10-CM | POA: Diagnosis not present

## 2021-07-03 MED ORDER — PREDNISONE 1 MG PO TABS: ORAL_TABLET | ORAL | 3 refills | Status: DC

## 2021-07-03 NOTE — Progress Notes (Signed)
Reason for visit: Temporal arteritis  [name] is an 80 y.o. female  History of present illness:  [name] is an 80 year old right-handed white female with a history of temporal arteritis, rheumatoid arthritis, cervical myelopathy, and peripheral neuropathy.  The patient has a gait disturbance.  She uses a walker to get around, she has not had any recent falls.  In May 2022, the patient had appendicitis and required a prolonged hospital stay and a stay at a rehab facility.  The patient has recovered from this.  She is back home, she is trying to stay active.  She is sleeping well at this point.  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  She is getting physical therapy ongoing.  She returns here for further evaluation.  She is no longer having headaches at this point.  Past Medical History:  Diagnosis Date   Allergic rhinitis    Anemia    Anxiety    Bronchiectasis    Carpal tunnel syndrome 07/13/2015   Bilateral   CVA (cerebral infarction) 10/2007   Right thalamic    Diverticulosis of colon    Gait disorder    GERD (gastroesophageal reflux disease)    History of breast cancer    HTN (hypertension)    Hyperlipidemia    Idiopathic pulmonary fibrosis    Left knee DJD    Migraine    Osteoporosis    Peripheral neuropathy    PMR (polymyalgia rheumatica) (HCC)    Polyneuropathy in other diseases classified elsewhere ([place]) 02/17/2013   Previous back surgery 10/09/12   Renal insufficiency    Rheumatoid arthritis (HCC)    Seizure disorder (HCC)    Spondylosis, cervical, with myelopathy 08/31/2015   C3-4 myelopathy   Temporal arteritis (HCC)    Right eye blind, on steroids per [facility]er[facility]/ Dr [name]   Type II or unspecified type diabetes mellitus without mention of complication, not stated as uncontrolled    2nd to steriods   Vitamin D deficiency     Past Surgical History:  Procedure Laterality Date   APPENDECTOMY  01/2021   CARPAL TUNNEL RELEASE Right    CATARACT  EXTRACTION     OS - Summer 2010   CERVICAL FUSION  09/24/2008   4 rods and pins in place   CHOLECYSTECTOMY     COLONOSCOPY  12/15/2011   Procedure: COLONOSCOPY;  Surgeon: [name], MD;  Location: [facility];  Service: Endoscopy;  Laterality: N/A;   LUMBAR FUSION  04/24/2010   W/Mechanical fixation - [facility]   LUMBAR FUSION  09/25/2011   rods in hips (to stabilize).   MASTECTOMY     NECK SURGERY     rheumatoid nodule removal     SPINAL FUSION  07/25/2010   T10-L2 interbody fusion / [place]     Left    Family History  Problem Relation Age of Onset   Brain cancer Mother    Hypertension Mother    Arthritis Mother    Dementia Father    Hypertension Father    Arthritis Father    Breast cancer Sister    Lung cancer Sister    Lung disease Neg Hx    Rheumatologic disease Neg Hx     Social history:  reports that she has never smoked. She has never used smokeless tobacco. She reports that she does not drink alcohol and does not use drugs.  Allergies  Allergen Reactions   Hydromorphone Other (See Comments)    Cognitive changes  "made me unconscious"    Medications:  Prior to Admission medications   Medication Sig Start Date End Date Taking? Authorizing Provider  amLODipine (NORVASC) 5 MG tablet TAKE 1 TABLET DAILY 02/26/20  Yes Burchette, Alinda Sierras, MD  Calcium Carb-Cholecalciferol (CALCIUM CARBONATE-VITAMIN D3 PO) Take 1 tablet by mouth daily   Yes [provider]  cholecalciferol (VITAMIN D) 1000 UNITS tablet Take 1,000 Units by mouth daily.   Yes [provider]  denosumab (PROLIA) 60 MG/ML SOLN injection Inject 60 mg into the skin every 6 (six) months.   Yes [provider]  diclofenac sodium (VOLTAREN) 1 % GEL APPLY 2 GRAMS TOPICALLY FOUR TIMES A DAY 05/12/19  Yes Kathrynn Ducking, MD  ferrous sulfate 325 (65 FE) MG tablet Take 325 mg by mouth 2 (two) times daily.   Yes  [provider]  fluticasone (FLONASE) 50 MCG/ACT nasal spray Place 2 sprays into both nostrils daily. 06/19/21  Yes Rozetta Nunnery, MD  furosemide (LASIX) 40 MG tablet Take 1 tablet (40 mg total) by mouth daily. 06/05/21  Yes Burchette, Alinda Sierras, MD  gabapentin (NEURONTIN) 300 MG capsule Take 2 capsules (600 mg total) by mouth 2 (two) times daily. 06/05/21  Yes Burchette, Alinda Sierras, MD  Golimumab ([place] ARIA IV) Inject into the vein. Takes infusion every 8 wks.   Yes [provider]  Multiple Vitamins-Minerals (MULTIVITAMIN,TX-MINERALS) tablet Take 1 tablet by mouth daily.   Yes [provider]  oxyCODONE (OXYCONTIN) 10 mg 12 hr tablet Take 1 tablet (10 mg total) by mouth every 12 (twelve) hours. 06/12/21  Yes Burchette, Alinda Sierras, MD  Oxycodone HCl 10 MG TABS Take 1 tablet (10 mg total) by mouth every 6 (six) hours as needed. Take 1 tablet by mouth every 6 hours as needed for breakthrough pain 10/18/20  Yes Burchette, Alinda Sierras, MD  potassium chloride (KLOR-CON) 10 MEQ tablet TAKE 1 TABLET TWICE A DAY 04/19/21  Yes Burchette, Alinda Sierras, MD  predniSONE (DELTASONE) 1 MG tablet TAKE 4 TABLETS DAILY WITH BREAKFAST 03/23/20  Yes Kathrynn Ducking, MD  simvastatin (ZOCOR) 5 MG tablet TAKE 1 TABLET AT BEDTIME Patient not taking: No sig reported 04/19/21   Eulas Post, MD  tizanidine (ZANAFLEX) 2 MG capsule Take 1 capsule (2 mg total) by mouth as needed for muscle spasms (take 1 at bedtime as needed for muscle spasms). Patient not taking: No sig reported 01/05/20   Kathrynn Ducking, MD  potassium chloride (K-DUR) 10 MEQ tablet Take 1 tablet (10 mEq total) by mouth 2 (two) times daily. 12/29/12 07/13/20  Norins, Heinz Knuckles, MD    ROS:  Out of a complete 14 system review of symptoms, the patient complains only of the following symptoms, and all other reviewed systems are negative.  Visual changes Walking difficulty  Blood pressure (!) 149/83, pulse 92, height 5\' 1"  (1.549 m),  weight 129 lb (58.5 kg).  Physical Exam  General: The patient is alert and cooperative at the time of the examination.  Skin: No significant peripheral edema is noted.   Neurologic Exam  Mental status: The patient is alert and oriented x 3 at the time of the examination. The patient has apparent normal recent and remote memory, with an apparently normal attention span and concentration ability.   Cranial nerves: Facial symmetry is present. Speech is normal, no aphasia or dysarthria is noted. Extraocular movements  are full.  There is exotropia of the right eye on primary gaze.  Decreased visual acuity of the right eye is noted.  Visual fields are full.  Motor: The patient has good strength in all 4 extremities, with exception of a left foot drop.  Sensory examination: Soft touch sensation is symmetric on the face, arms, and legs.  Coordination: The patient has good finger-nose-finger and heel-to-shin bilaterally.  Gait and station: The patient a stooped posture, she will ambulates with a walker.  Reflexes: Deep tendon reflexes are symmetric.   MRI brain 01/07/21:  IMPRESSION: This MRI of the brain with and without contrast shows the following: 1.   Normal enhancement pattern.  No acute findings. 2.   Mild generalized cortical atrophy, progressed compared to the 2014 MRI. 3.   Scattered T2/FLAIR hyperintense foci in the hemispheres and pons consistent with moderate chronic microvascular ischemic change, progressed compared to the 2014 MRI. 4.   2 chronic microhemorrhages in the right parietal lobe, unchanged compared to the 2014 MRI. 5.   Normal enhancement pattern.  No acute findings.   * MRI scan images were reviewed online. I agree with the written report.   Carotid doppler 01/23/21:   Summary: Right Carotid: The extracranial vessels were near-normal with only minimal wall                thickening or plaque.   Left Carotid: The extracranial vessels were near-normal with  only minimal wall               thickening or plaque.   Vertebrals:  Bilateral vertebral arteries demonstrate antegrade flow. Subclavians: Normal flow hemodynamics were seen in bilateral subclavian              arteries.   Assessment/Plan:  1.  Temporal arteritis  2.  Gait disturbance  3.  Peripheral neuropathy  The patient will try to reduce the prednisone dose slightly taking 3 mg alternating with 4 mg every other day.  Previously, 3 mg daily dose resulted in increased malaise and symptoms.  She will follow-up here in 6 months.  In the future, she can be followed through Dr. Krista Blue.  Jill Alexanders MD 07/03/2021 1:17 PM  [facility] [address] [place], [place] [address]  Phone [phone] Fax [phone]

## 2021-07-05 DIAGNOSIS — M069 Rheumatoid arthritis, unspecified: Secondary | ICD-10-CM | POA: Diagnosis not present

## 2021-07-05 DIAGNOSIS — R32 Unspecified urinary incontinence: Secondary | ICD-10-CM | POA: Diagnosis not present

## 2021-07-05 DIAGNOSIS — K5790 Diverticulosis of intestine, part unspecified, without perforation or abscess without bleeding: Secondary | ICD-10-CM | POA: Diagnosis not present

## 2021-07-05 DIAGNOSIS — K5900 Constipation, unspecified: Secondary | ICD-10-CM | POA: Diagnosis not present

## 2021-07-05 DIAGNOSIS — I10 Essential (primary) hypertension: Secondary | ICD-10-CM | POA: Diagnosis not present

## 2021-07-05 DIAGNOSIS — K3532 Acute appendicitis with perforation and localized peritonitis, without abscess: Secondary | ICD-10-CM | POA: Diagnosis not present

## 2021-07-07 DIAGNOSIS — K5790 Diverticulosis of intestine, part unspecified, without perforation or abscess without bleeding: Secondary | ICD-10-CM | POA: Diagnosis not present

## 2021-07-07 DIAGNOSIS — K3532 Acute appendicitis with perforation and localized peritonitis, without abscess: Secondary | ICD-10-CM | POA: Diagnosis not present

## 2021-07-07 DIAGNOSIS — M069 Rheumatoid arthritis, unspecified: Secondary | ICD-10-CM | POA: Diagnosis not present

## 2021-07-07 DIAGNOSIS — I10 Essential (primary) hypertension: Secondary | ICD-10-CM | POA: Diagnosis not present

## 2021-07-07 DIAGNOSIS — K5900 Constipation, unspecified: Secondary | ICD-10-CM | POA: Diagnosis not present

## 2021-07-07 DIAGNOSIS — R32 Unspecified urinary incontinence: Secondary | ICD-10-CM | POA: Diagnosis not present

## 2021-07-10 ENCOUNTER — Telehealth: Payer: Self-pay | Admitting: Family Medicine

## 2021-07-10 NOTE — Telephone Encounter (Signed)
Pt is calling and would like a refill on oxycontin 10 mg send to [facility] [address] [place] in [place]

## 2021-07-12 DIAGNOSIS — K5790 Diverticulosis of intestine, part unspecified, without perforation or abscess without bleeding: Secondary | ICD-10-CM | POA: Diagnosis not present

## 2021-07-12 DIAGNOSIS — R32 Unspecified urinary incontinence: Secondary | ICD-10-CM | POA: Diagnosis not present

## 2021-07-12 DIAGNOSIS — M069 Rheumatoid arthritis, unspecified: Secondary | ICD-10-CM | POA: Diagnosis not present

## 2021-07-12 DIAGNOSIS — K3532 Acute appendicitis with perforation and localized peritonitis, without abscess: Secondary | ICD-10-CM | POA: Diagnosis not present

## 2021-07-12 DIAGNOSIS — I10 Essential (primary) hypertension: Secondary | ICD-10-CM | POA: Diagnosis not present

## 2021-07-12 DIAGNOSIS — K5900 Constipation, unspecified: Secondary | ICD-10-CM | POA: Diagnosis not present

## 2021-07-12 MED ORDER — OXYCODONE HCL ER 10 MG PO T12A: 10.0000 mg | EXTENDED_RELEASE_TABLET | Freq: Two times a day (BID) | ORAL | 0 refills | Status: DC

## 2021-07-12 NOTE — Telephone Encounter (Signed)
I refilled her OxyContin for 3 months.  Make sure she has 16-month office follow-up.

## 2021-07-12 NOTE — Telephone Encounter (Signed)
Spoke with the patient. She is aware her prescription has been sent in and will call back to schedule a follow up.

## 2021-07-12 NOTE — Telephone Encounter (Signed)
Last filled 06/12/2021 Last OV 06/12/2021  Ok to fill?

## 2021-07-13 DIAGNOSIS — K5900 Constipation, unspecified: Secondary | ICD-10-CM | POA: Diagnosis not present

## 2021-07-13 DIAGNOSIS — M069 Rheumatoid arthritis, unspecified: Secondary | ICD-10-CM | POA: Diagnosis not present

## 2021-07-13 DIAGNOSIS — A0472 Enterocolitis due to Clostridium difficile, not specified as recurrent: Secondary | ICD-10-CM | POA: Diagnosis not present

## 2021-07-13 DIAGNOSIS — K3532 Acute appendicitis with perforation and localized peritonitis, without abscess: Secondary | ICD-10-CM | POA: Diagnosis not present

## 2021-07-13 DIAGNOSIS — K219 Gastro-esophageal reflux disease without esophagitis: Secondary | ICD-10-CM | POA: Diagnosis not present

## 2021-07-13 DIAGNOSIS — I10 Essential (primary) hypertension: Secondary | ICD-10-CM | POA: Diagnosis not present

## 2021-07-13 DIAGNOSIS — E559 Vitamin D deficiency, unspecified: Secondary | ICD-10-CM | POA: Diagnosis not present

## 2021-07-13 DIAGNOSIS — H5461 Unqualified visual loss, right eye, normal vision left eye: Secondary | ICD-10-CM | POA: Diagnosis not present

## 2021-07-13 DIAGNOSIS — J8410 Pulmonary fibrosis, unspecified: Secondary | ICD-10-CM | POA: Diagnosis not present

## 2021-07-13 DIAGNOSIS — M5136 Other intervertebral disc degeneration, lumbar region: Secondary | ICD-10-CM | POA: Diagnosis not present

## 2021-07-13 DIAGNOSIS — E785 Hyperlipidemia, unspecified: Secondary | ICD-10-CM | POA: Diagnosis not present

## 2021-07-13 DIAGNOSIS — D649 Anemia, unspecified: Secondary | ICD-10-CM | POA: Diagnosis not present

## 2021-07-13 DIAGNOSIS — R32 Unspecified urinary incontinence: Secondary | ICD-10-CM | POA: Diagnosis not present

## 2021-07-13 DIAGNOSIS — K5790 Diverticulosis of intestine, part unspecified, without perforation or abscess without bleeding: Secondary | ICD-10-CM | POA: Diagnosis not present

## 2021-07-14 ENCOUNTER — Other Ambulatory Visit: Payer: Self-pay

## 2021-07-14 ENCOUNTER — Ambulatory Visit (INDEPENDENT_AMBULATORY_CARE_PROVIDER_SITE_OTHER): Payer: Medicare Other | Admitting: Family Medicine

## 2021-07-14 VITALS — BP 142/70 | HR 88 | Temp 97.9°F | Wt 127.0 lb

## 2021-07-14 DIAGNOSIS — L723 Sebaceous cyst: Secondary | ICD-10-CM

## 2021-07-14 DIAGNOSIS — R5383 Other fatigue: Secondary | ICD-10-CM

## 2021-07-14 DIAGNOSIS — L659 Nonscarring hair loss, unspecified: Secondary | ICD-10-CM

## 2021-07-14 DIAGNOSIS — R419 Unspecified symptoms and signs involving cognitive functions and awareness: Secondary | ICD-10-CM | POA: Diagnosis not present

## 2021-07-14 DIAGNOSIS — R195 Other fecal abnormalities: Secondary | ICD-10-CM | POA: Diagnosis not present

## 2021-07-14 DIAGNOSIS — R634 Abnormal weight loss: Secondary | ICD-10-CM | POA: Diagnosis not present

## 2021-07-14 LAB — CBC WITH DIFFERENTIAL/PLATELET
Basophils Absolute: 0.1 10*3/uL (ref 0.0–0.1)
Basophils Relative: 0.7 % (ref 0.0–3.0)
Eosinophils Absolute: 0.4 10*3/uL (ref 0.0–0.7)
Eosinophils Relative: 4.5 % (ref 0.0–5.0)
HCT: 40.1 % (ref 36.0–46.0)
Hemoglobin: 13 g/dL (ref 12.0–15.0)
Lymphocytes Relative: 18.5 % (ref 12.0–46.0)
Lymphs Abs: 1.7 10*3/uL (ref 0.7–4.0)
MCHC: 32.4 g/dL (ref 30.0–36.0)
MCV: 90.9 fl (ref 78.0–100.0)
Monocytes Absolute: 0.4 10*3/uL (ref 0.1–1.0)
Monocytes Relative: 4.8 % (ref 3.0–12.0)
Neutro Abs: 6.5 10*3/uL (ref 1.4–7.7)
Neutrophils Relative %: 71.5 % (ref 43.0–77.0)
Platelets: 253 10*3/uL (ref 150.0–400.0)
RBC: 4.41 Mil/uL (ref 3.87–5.11)
RDW: 16.5 % — ABNORMAL HIGH (ref 11.5–15.5)
WBC: 9.1 10*3/uL (ref 4.0–10.5)

## 2021-07-14 LAB — COMPREHENSIVE METABOLIC PANEL
ALT: 10 U/L (ref 0–35)
AST: 21 U/L (ref 0–37)
Albumin: 3.8 g/dL (ref 3.5–5.2)
Alkaline Phosphatase: 86 U/L (ref 39–117)
BUN: 24 mg/dL — ABNORMAL HIGH (ref 6–23)
CO2: 32 mEq/L (ref 19–32)
Calcium: 10 mg/dL (ref 8.4–10.5)
Chloride: 99 mEq/L (ref 96–112)
Creatinine, Ser: 1.33 mg/dL — ABNORMAL HIGH (ref 0.40–1.20)
GFR: 37.61 mL/min — ABNORMAL LOW (ref >60.00)
Glucose, Bld: 85 mg/dL (ref 70–99)
Potassium: 3.3 mEq/L — ABNORMAL LOW (ref 3.5–5.1)
Sodium: 139 mEq/L (ref 135–145)
Total Bilirubin: 0.7 mg/dL (ref 0.2–1.2)
Total Protein: 7.5 g/dL (ref 6.0–8.3)

## 2021-07-14 LAB — TSH: TSH: 3.17 u[IU]/mL (ref 0.35–5.50)

## 2021-07-14 LAB — VITAMIN B12: Vitamin B-12: 351 pg/mL (ref 211–911)

## 2021-07-14 NOTE — Patient Instructions (Signed)
Consider fiber supplement such as Metamucil, Fibercon

## 2021-07-14 NOTE — Progress Notes (Signed)
Established Patient Office Visit  Subjective:  Patient ID: [name], female    DOB: October 29, 1940  Age: 80 y.o. MRN: [id number]  CC:  Chief Complaint  Patient presents with   hair thinning    Hair loss, memory issues, stomach issues.     HPI [name] presents for concern for several items as follows  Diffuse alopecia past few months.  Denies any new hair treatment products.  Has had some weight loss and fatigue issues.  Last TSH was over a year ago and normal.  Second issue is a "knot "on parietal area of scalp.  She has a small bump which is nontender.  First noted about 6 months ago.  She has somewhat frequent loose stools.  Usually occur about 30 minutes after eating.  No diarrhea.  No bloody stools.  Appetite is poor to fair.  No abdominal pain.  She thinks that stool frequency may be due to some stress issues.  She recently had heating issues with her home which she finally got fixed.  Other issues concern for possible cognitive deficit.  She is here with a friend who has been living with her recently.  Her friend states that she seems to have some occasional impairment with short-term memory.  Her father did apparently have dementia.  Past Medical History:  Diagnosis Date   Allergic rhinitis    Anemia    Anxiety    Bronchiectasis    Carpal tunnel syndrome 07/13/2015   Bilateral   CVA (cerebral infarction) 10/2007   Right thalamic    Diverticulosis of colon    Gait disorder    GERD (gastroesophageal reflux disease)    History of breast cancer    HTN (hypertension)    Hyperlipidemia    Idiopathic pulmonary fibrosis    Left knee DJD    Migraine    Osteoporosis    Peripheral neuropathy    PMR (polymyalgia rheumatica) (HCC)    Polyneuropathy in other diseases classified elsewhere ([place]) 02/17/2013   Previous back surgery 10/09/12   Renal insufficiency    Rheumatoid arthritis (HCC)    Seizure disorder (HCC)    Spondylosis, cervical, with myelopathy  08/31/2015   C3-4 myelopathy   Temporal arteritis (HCC)    Right eye blind, on steroids per [facility]/ Dr [name]   Type II or unspecified type diabetes mellitus without mention of complication, not stated as uncontrolled    2nd to steriods   Vitamin D deficiency     Past Surgical History:  Procedure Laterality Date   APPENDECTOMY  01/2021   CARPAL TUNNEL RELEASE Right    CATARACT EXTRACTION     OS - Summer 2010   CERVICAL FUSION  09/24/2008   4 rods and pins in place   CHOLECYSTECTOMY     COLONOSCOPY  12/15/2011   Procedure: COLONOSCOPY;  Surgeon: [name], MD;  Location: [facility];  Service: Endoscopy;  Laterality: N/A;   LUMBAR FUSION  04/24/2010   W/Mechanical fixation - [facility]   LUMBAR FUSION  09/25/2011   rods in hips (to stabilize).   MASTECTOMY     NECK SURGERY     rheumatoid nodule removal     SPINAL FUSION  07/25/2010   T10-L2 interbody fusion / [facility]   TONSILLECTOMY     TOTAL KNEE ARTHROPLASTY     Left    Family History  Problem Relation Age of Onset   Brain cancer Mother    Hypertension Mother  Arthritis Mother    Dementia Father    Hypertension Father    Arthritis Father    Breast cancer Sister    Lung cancer Sister    Lung disease Neg Hx    Rheumatologic disease Neg Hx     Social History   Socioeconomic History   Marital status: Widowed    Spouse name: Not on file   Number of children: 1   Years of education: 12+ coll.   Highest education level: Not on file  Occupational History   Occupation: Education officer, environmental: RETIRED    Comment: retired  Tobacco Use   Smoking status: Never   Smokeless tobacco: Never   Tobacco comments:    Father   Vaping Use   Vaping Use: Never used  Substance and Sexual Activity   Alcohol use: No    Alcohol/week: 0.0 standard drinks    Comment: heavy drinker until 1995 - sobriety with AA   Drug use: No   Sexual activity: Not Currently  Other  Topics Concern   Not on file  Social History Narrative   07/03/21 friend [name] living with her   Right handed   Drinks 6-8 cups caffeine daily      Social Determinants of Health   Financial Resource Strain: Low Risk    Difficulty of Paying Living Expenses: Not hard at all  Food Insecurity: No Food Insecurity   Worried About Charity fundraiser in the Last Year: Never true   Arboriculturist in the Last Year: Never true  Transportation Needs: No Transportation Needs   Lack of Transportation (Medical): No   Lack of Transportation (Non-Medical): No  Physical Activity: Inactive   Days of Exercise per Week: 0 days   Minutes of Exercise per Session: 0 min  Stress: No Stress Concern Present   Feeling of Stress : Not at all  Social Connections: Socially Isolated   Frequency of Communication with Friends and Family: Three times a week   Frequency of Social Gatherings with Friends and Family: Three times a week   Attends Religious Services: Never   Active Member of Clubs or Organizations: No   Attends Archivist Meetings: Never   Marital Status: Widowed  Human resources officer Violence: Not At Risk   Fear of Current or Ex-Partner: No   Emotionally Abused: No   Physically Abused: No   Sexually Abused: No    Outpatient Medications Prior to Visit  Medication Sig Dispense Refill   amLODipine (NORVASC) 5 MG tablet TAKE 1 TABLET DAILY 90 tablet 3   Calcium Carb-Cholecalciferol (CALCIUM CARBONATE-VITAMIN D3 PO) Take 1 tablet by mouth daily     cholecalciferol (VITAMIN D) 1000 UNITS tablet Take 1,000 Units by mouth daily.     denosumab (PROLIA) 60 MG/ML SOLN injection Inject 60 mg into the skin every 6 (six) months.     diclofenac sodium (VOLTAREN) 1 % GEL APPLY 2 GRAMS TOPICALLY FOUR TIMES A DAY 400 g 6   ferrous sulfate 325 (65 FE) MG tablet Take 325 mg by mouth 2 (two) times daily.     fluticasone (FLONASE) 50 MCG/ACT nasal spray Place 2 sprays into both nostrils daily. 16 g 6    furosemide (LASIX) 40 MG tablet Take 1 tablet (40 mg total) by mouth daily. 90 tablet 0   gabapentin (NEURONTIN) 300 MG capsule Take 2 capsules (600 mg total) by mouth 2 (two) times daily. 360 capsule 0  Golimumab ([place] ARIA IV) Inject into the vein. Takes infusion every 8 wks.     Multiple Vitamins-Minerals (MULTIVITAMIN,TX-MINERALS) tablet Take 1 tablet by mouth daily.     oxyCODONE (OXYCONTIN) 10 mg 12 hr tablet Take 1 tablet (10 mg total) by mouth every 12 (twelve) hours. 60 tablet 0   oxyCODONE (OXYCONTIN) 10 mg 12 hr tablet Take 1 tablet (10 mg total) by mouth every 12 (twelve) hours. 60 tablet 0   oxyCODONE (OXYCONTIN) 10 mg 12 hr tablet Take 1 tablet (10 mg total) by mouth every 12 (twelve) hours. 60 tablet 0   Oxycodone HCl 10 MG TABS Take 1 tablet (10 mg total) by mouth every 6 (six) hours as needed. Take 1 tablet by mouth every 6 hours as needed for breakthrough pain 90 tablet 0   potassium chloride (KLOR-CON) 10 MEQ tablet TAKE 1 TABLET TWICE A DAY 180 tablet 1   predniSONE (DELTASONE) 1 MG tablet TAKE 4 TABLETS DAILY WITH BREAKFAST 360 tablet 3   simvastatin (ZOCOR) 5 MG tablet TAKE 1 TABLET AT BEDTIME 90 tablet 1   No facility-administered medications prior to visit.    Allergies  Allergen Reactions   Hydromorphone Other (See Comments)    Cognitive changes  "made me unconscious"    ROS Review of Systems  Constitutional:  Negative for chills and fever.  Respiratory:  Negative for cough and shortness of breath.   Cardiovascular:  Negative for chest pain.  Gastrointestinal:  Negative for abdominal distention, blood in stool, nausea and vomiting.     Objective:    Physical Exam Vitals reviewed.  Constitutional:      Appearance: Normal appearance.  Cardiovascular:     Rate and Rhythm: Normal rate and regular rhythm.  Pulmonary:     Effort: Pulmonary effort is normal.     Breath sounds: Normal breath sounds.  Skin:    Comments: She has small approximately half  centimeter mobile nontender cyst mid parietal scalp  Neurological:     Mental Status: She is alert.    BP (!) 142/70 (BP Location: Left Arm, Patient Position: Sitting, Cuff Size: Normal)   Pulse 88   Temp 97.9 F (36.6 C) (Oral)   Wt 127 lb (57.6 kg)   SpO2 96%   BMI 24.00 kg/m  Wt Readings from Last 3 Encounters:  07/14/21 127 lb (57.6 kg)  07/03/21 129 lb (58.5 kg)  06/12/21 130 lb 8 oz (59.2 kg)     Health Maintenance Due  Topic Date Due   OPHTHALMOLOGY EXAM  Never done   Zoster Vaccines- Shingrix (1 of 2) Never done   FOOT EXAM  04/26/2018   HEMOGLOBIN A1C  04/29/2018   COVID-19 Vaccine (3 - Pfizer risk series) 12/27/2019    There are no preventive care reminders to display for this patient.  Lab Results  Component Value Date   TSH 2.13 01/08/2020   Lab Results  Component Value Date   WBC 7.6 09/14/2020   HGB 13.5 09/14/2020   HCT 40.9 09/14/2020   MCV 98.8 09/14/2020   PLT 232 09/14/2020   Lab Results  Component Value Date   NA 140 10/18/2020   K 4.6 10/18/2020   CO2 34 (H) 10/18/2020   GLUCOSE 100 (H) 10/18/2020   BUN 24 (H) 10/18/2020   CREATININE 1.31 (H) 12/29/2020   BILITOT 0.8 10/18/2020   ALKPHOS 69 10/18/2020   AST 28 10/18/2020   ALT 22 10/18/2020   PROT 7.4 10/18/2020   ALBUMIN 4.1 10/18/2020  CALCIUM 10.0 10/18/2020   ANIONGAP 10 09/14/2020   EGFR 41 (L) 12/29/2020   GFR 43.20 (L) 10/18/2020   Lab Results  Component Value Date   CHOL 144 10/18/2020   Lab Results  Component Value Date   HDL 62.50 10/18/2020   Lab Results  Component Value Date   LDLCALC 63 10/18/2020   Lab Results  Component Value Date   TRIG 92.0 10/18/2020   Lab Results  Component Value Date   CHOLHDL 2 10/18/2020   Lab Results  Component Value Date   HGBA1C 5.3 10/30/2017      Assessment & Plan:   #1 benign appearing sebaceous cyst parietal scalp.  Reassurance given.  #2 diffuse alopecia.  Check TSH.  If normal, consider trial of  Rogaine  #3 concern for cognitive change.  She actually scored 30 out of 30 on MMSE.  No problems with clock drawing. -We have suggested consideration for 66-monthfollow-up.  We also discussed option of referral to neuropsychologist that she had ongoing concerns. -Also checking TSH as above and include B12  #4 intermittent loose stools.  Possibly stress related.  She does not have any red flags such as bloody stools, diarrhea, recent antibiotics, etc.   No orders of the defined types were placed in this encounter.   Follow-up: No follow-ups on file.    BCarolann Littler MD

## 2021-07-17 DIAGNOSIS — I10 Essential (primary) hypertension: Secondary | ICD-10-CM | POA: Diagnosis not present

## 2021-07-17 DIAGNOSIS — K5900 Constipation, unspecified: Secondary | ICD-10-CM | POA: Diagnosis not present

## 2021-07-17 DIAGNOSIS — K3532 Acute appendicitis with perforation and localized peritonitis, without abscess: Secondary | ICD-10-CM | POA: Diagnosis not present

## 2021-07-17 DIAGNOSIS — R32 Unspecified urinary incontinence: Secondary | ICD-10-CM | POA: Diagnosis not present

## 2021-07-17 DIAGNOSIS — M069 Rheumatoid arthritis, unspecified: Secondary | ICD-10-CM | POA: Diagnosis not present

## 2021-07-17 DIAGNOSIS — K5790 Diverticulosis of intestine, part unspecified, without perforation or abscess without bleeding: Secondary | ICD-10-CM | POA: Diagnosis not present

## 2021-07-18 ENCOUNTER — Telehealth: Payer: Self-pay | Admitting: Family Medicine

## 2021-07-18 DIAGNOSIS — R5383 Other fatigue: Secondary | ICD-10-CM | POA: Diagnosis not present

## 2021-07-18 DIAGNOSIS — N184 Chronic kidney disease, stage 4 (severe): Secondary | ICD-10-CM | POA: Diagnosis not present

## 2021-07-18 DIAGNOSIS — Z111 Encounter for screening for respiratory tuberculosis: Secondary | ICD-10-CM | POA: Diagnosis not present

## 2021-07-18 DIAGNOSIS — M5136 Other intervertebral disc degeneration, lumbar region: Secondary | ICD-10-CM | POA: Diagnosis not present

## 2021-07-18 DIAGNOSIS — Z6822 Body mass index (BMI) 22.0-22.9, adult: Secondary | ICD-10-CM | POA: Diagnosis not present

## 2021-07-18 DIAGNOSIS — M79672 Pain in left foot: Secondary | ICD-10-CM | POA: Diagnosis not present

## 2021-07-18 DIAGNOSIS — M316 Other giant cell arteritis: Secondary | ICD-10-CM | POA: Diagnosis not present

## 2021-07-18 DIAGNOSIS — M150 Primary generalized (osteo)arthritis: Secondary | ICD-10-CM | POA: Diagnosis not present

## 2021-07-18 DIAGNOSIS — M810 Age-related osteoporosis without current pathological fracture: Secondary | ICD-10-CM | POA: Diagnosis not present

## 2021-07-18 DIAGNOSIS — M79645 Pain in left finger(s): Secondary | ICD-10-CM | POA: Diagnosis not present

## 2021-07-18 DIAGNOSIS — M0589 Other rheumatoid arthritis with rheumatoid factor of multiple sites: Secondary | ICD-10-CM | POA: Diagnosis not present

## 2021-07-18 DIAGNOSIS — J8410 Pulmonary fibrosis, unspecified: Secondary | ICD-10-CM | POA: Diagnosis not present

## 2021-07-18 NOTE — Telephone Encounter (Signed)
Left message for patient to call back  

## 2021-07-18 NOTE — Telephone Encounter (Signed)
Patient was in the hospital in [facility] and her potassium was low.  They increased her Klor-con, however now she has been taking 10 mg like before.  She wants to know which dose she should be taking, however she doesn't know the higher dose mg given to her in the hospital.    Pt goes to the dentist at 10:00 am on Wednesday so needs a call back so she can let them know and so she will know what to take as well. Please give pt a call.

## 2021-07-19 DIAGNOSIS — K5790 Diverticulosis of intestine, part unspecified, without perforation or abscess without bleeding: Secondary | ICD-10-CM | POA: Diagnosis not present

## 2021-07-19 DIAGNOSIS — R32 Unspecified urinary incontinence: Secondary | ICD-10-CM | POA: Diagnosis not present

## 2021-07-19 DIAGNOSIS — K5900 Constipation, unspecified: Secondary | ICD-10-CM | POA: Diagnosis not present

## 2021-07-19 DIAGNOSIS — M069 Rheumatoid arthritis, unspecified: Secondary | ICD-10-CM | POA: Diagnosis not present

## 2021-07-19 DIAGNOSIS — K3532 Acute appendicitis with perforation and localized peritonitis, without abscess: Secondary | ICD-10-CM | POA: Diagnosis not present

## 2021-07-19 DIAGNOSIS — I10 Essential (primary) hypertension: Secondary | ICD-10-CM | POA: Diagnosis not present

## 2021-07-19 NOTE — Telephone Encounter (Signed)
Spoke with the patient. She is aware of Dr. Burchette's message.  °

## 2021-07-20 ENCOUNTER — Telehealth: Payer: Self-pay

## 2021-07-20 NOTE — Telephone Encounter (Signed)
Flor from home health called to request an extention for physical therapy 1x 3 weeks Call back # [phone]

## 2021-07-21 NOTE — Telephone Encounter (Signed)
Spoke with [facility]. Verbal order has been given.

## 2021-07-26 DIAGNOSIS — K5790 Diverticulosis of intestine, part unspecified, without perforation or abscess without bleeding: Secondary | ICD-10-CM | POA: Diagnosis not present

## 2021-07-26 DIAGNOSIS — K3532 Acute appendicitis with perforation and localized peritonitis, without abscess: Secondary | ICD-10-CM | POA: Diagnosis not present

## 2021-07-26 DIAGNOSIS — I10 Essential (primary) hypertension: Secondary | ICD-10-CM | POA: Diagnosis not present

## 2021-07-26 DIAGNOSIS — R32 Unspecified urinary incontinence: Secondary | ICD-10-CM | POA: Diagnosis not present

## 2021-07-26 DIAGNOSIS — K5900 Constipation, unspecified: Secondary | ICD-10-CM | POA: Diagnosis not present

## 2021-07-26 DIAGNOSIS — M069 Rheumatoid arthritis, unspecified: Secondary | ICD-10-CM | POA: Diagnosis not present

## 2021-07-28 DIAGNOSIS — M810 Age-related osteoporosis without current pathological fracture: Secondary | ICD-10-CM | POA: Diagnosis not present

## 2021-07-31 DIAGNOSIS — M0589 Other rheumatoid arthritis with rheumatoid factor of multiple sites: Secondary | ICD-10-CM | POA: Diagnosis not present

## 2021-08-01 DIAGNOSIS — I10 Essential (primary) hypertension: Secondary | ICD-10-CM | POA: Diagnosis not present

## 2021-08-01 DIAGNOSIS — M069 Rheumatoid arthritis, unspecified: Secondary | ICD-10-CM | POA: Diagnosis not present

## 2021-08-01 DIAGNOSIS — K3532 Acute appendicitis with perforation and localized peritonitis, without abscess: Secondary | ICD-10-CM | POA: Diagnosis not present

## 2021-08-01 DIAGNOSIS — K5900 Constipation, unspecified: Secondary | ICD-10-CM | POA: Diagnosis not present

## 2021-08-01 DIAGNOSIS — R32 Unspecified urinary incontinence: Secondary | ICD-10-CM | POA: Diagnosis not present

## 2021-08-01 DIAGNOSIS — K5790 Diverticulosis of intestine, part unspecified, without perforation or abscess without bleeding: Secondary | ICD-10-CM | POA: Diagnosis not present

## 2021-08-07 ENCOUNTER — Telehealth: Payer: Self-pay | Admitting: Family Medicine

## 2021-08-07 DIAGNOSIS — R32 Unspecified urinary incontinence: Secondary | ICD-10-CM | POA: Diagnosis not present

## 2021-08-07 DIAGNOSIS — I10 Essential (primary) hypertension: Secondary | ICD-10-CM | POA: Diagnosis not present

## 2021-08-07 DIAGNOSIS — K5790 Diverticulosis of intestine, part unspecified, without perforation or abscess without bleeding: Secondary | ICD-10-CM | POA: Diagnosis not present

## 2021-08-07 DIAGNOSIS — K5900 Constipation, unspecified: Secondary | ICD-10-CM | POA: Diagnosis not present

## 2021-08-07 DIAGNOSIS — M069 Rheumatoid arthritis, unspecified: Secondary | ICD-10-CM | POA: Diagnosis not present

## 2021-08-07 DIAGNOSIS — K3532 Acute appendicitis with perforation and localized peritonitis, without abscess: Secondary | ICD-10-CM | POA: Diagnosis not present

## 2021-08-07 NOTE — Telephone Encounter (Signed)
Flora  PT with [facility] is calling and need extension order for the pt to have physical therapy 1x4

## 2021-08-08 NOTE — Telephone Encounter (Signed)
Orders have been given.  

## 2021-08-09 ENCOUNTER — Other Ambulatory Visit: Payer: Self-pay

## 2021-08-09 ENCOUNTER — Ambulatory Visit (INDEPENDENT_AMBULATORY_CARE_PROVIDER_SITE_OTHER): Payer: Medicare Other | Admitting: Dermatology

## 2021-08-09 ENCOUNTER — Encounter: Payer: Self-pay | Admitting: Dermatology

## 2021-08-09 DIAGNOSIS — R208 Other disturbances of skin sensation: Secondary | ICD-10-CM

## 2021-08-09 DIAGNOSIS — L821 Other seborrheic keratosis: Secondary | ICD-10-CM

## 2021-08-09 DIAGNOSIS — L299 Pruritus, unspecified: Secondary | ICD-10-CM

## 2021-08-09 DIAGNOSIS — Z1283 Encounter for screening for malignant neoplasm of skin: Secondary | ICD-10-CM

## 2021-08-09 NOTE — Patient Instructions (Signed)
Pramoxine anti itch cream.

## 2021-08-12 DIAGNOSIS — R32 Unspecified urinary incontinence: Secondary | ICD-10-CM | POA: Diagnosis not present

## 2021-08-12 DIAGNOSIS — M069 Rheumatoid arthritis, unspecified: Secondary | ICD-10-CM | POA: Diagnosis not present

## 2021-08-12 DIAGNOSIS — J8410 Pulmonary fibrosis, unspecified: Secondary | ICD-10-CM | POA: Diagnosis not present

## 2021-08-12 DIAGNOSIS — E785 Hyperlipidemia, unspecified: Secondary | ICD-10-CM | POA: Diagnosis not present

## 2021-08-12 DIAGNOSIS — K5790 Diverticulosis of intestine, part unspecified, without perforation or abscess without bleeding: Secondary | ICD-10-CM | POA: Diagnosis not present

## 2021-08-12 DIAGNOSIS — I10 Essential (primary) hypertension: Secondary | ICD-10-CM | POA: Diagnosis not present

## 2021-08-12 DIAGNOSIS — A0472 Enterocolitis due to Clostridium difficile, not specified as recurrent: Secondary | ICD-10-CM | POA: Diagnosis not present

## 2021-08-12 DIAGNOSIS — H5461 Unqualified visual loss, right eye, normal vision left eye: Secondary | ICD-10-CM | POA: Diagnosis not present

## 2021-08-12 DIAGNOSIS — K5900 Constipation, unspecified: Secondary | ICD-10-CM | POA: Diagnosis not present

## 2021-08-12 DIAGNOSIS — M5136 Other intervertebral disc degeneration, lumbar region: Secondary | ICD-10-CM | POA: Diagnosis not present

## 2021-08-12 DIAGNOSIS — K219 Gastro-esophageal reflux disease without esophagitis: Secondary | ICD-10-CM | POA: Diagnosis not present

## 2021-08-12 DIAGNOSIS — D649 Anemia, unspecified: Secondary | ICD-10-CM | POA: Diagnosis not present

## 2021-08-12 DIAGNOSIS — K3532 Acute appendicitis with perforation and localized peritonitis, without abscess: Secondary | ICD-10-CM | POA: Diagnosis not present

## 2021-08-12 DIAGNOSIS — E559 Vitamin D deficiency, unspecified: Secondary | ICD-10-CM | POA: Diagnosis not present

## 2021-08-16 ENCOUNTER — Telehealth: Payer: Self-pay

## 2021-08-16 NOTE — Telephone Encounter (Signed)
Christene Lye PT called FYI patient missed her appt due to the holidays  [phone]

## 2021-08-23 DIAGNOSIS — M069 Rheumatoid arthritis, unspecified: Secondary | ICD-10-CM | POA: Diagnosis not present

## 2021-08-23 DIAGNOSIS — K5790 Diverticulosis of intestine, part unspecified, without perforation or abscess without bleeding: Secondary | ICD-10-CM | POA: Diagnosis not present

## 2021-08-23 DIAGNOSIS — I10 Essential (primary) hypertension: Secondary | ICD-10-CM | POA: Diagnosis not present

## 2021-08-23 DIAGNOSIS — R32 Unspecified urinary incontinence: Secondary | ICD-10-CM | POA: Diagnosis not present

## 2021-08-23 DIAGNOSIS — K3532 Acute appendicitis with perforation and localized peritonitis, without abscess: Secondary | ICD-10-CM | POA: Diagnosis not present

## 2021-08-23 DIAGNOSIS — K5900 Constipation, unspecified: Secondary | ICD-10-CM | POA: Diagnosis not present

## 2021-08-25 ENCOUNTER — Other Ambulatory Visit: Payer: Self-pay | Admitting: Family Medicine

## 2021-08-29 DIAGNOSIS — I10 Essential (primary) hypertension: Secondary | ICD-10-CM | POA: Diagnosis not present

## 2021-08-29 DIAGNOSIS — K5900 Constipation, unspecified: Secondary | ICD-10-CM | POA: Diagnosis not present

## 2021-08-29 DIAGNOSIS — K3532 Acute appendicitis with perforation and localized peritonitis, without abscess: Secondary | ICD-10-CM | POA: Diagnosis not present

## 2021-08-29 DIAGNOSIS — K5790 Diverticulosis of intestine, part unspecified, without perforation or abscess without bleeding: Secondary | ICD-10-CM | POA: Diagnosis not present

## 2021-08-29 DIAGNOSIS — R32 Unspecified urinary incontinence: Secondary | ICD-10-CM | POA: Diagnosis not present

## 2021-08-29 DIAGNOSIS — M069 Rheumatoid arthritis, unspecified: Secondary | ICD-10-CM | POA: Diagnosis not present

## 2021-09-01 DIAGNOSIS — N390 Urinary tract infection, site not specified: Secondary | ICD-10-CM | POA: Diagnosis not present

## 2021-09-01 DIAGNOSIS — H5010 Unspecified exotropia: Secondary | ICD-10-CM | POA: Diagnosis not present

## 2021-09-01 DIAGNOSIS — J8410 Pulmonary fibrosis, unspecified: Secondary | ICD-10-CM | POA: Diagnosis not present

## 2021-09-01 DIAGNOSIS — M069 Rheumatoid arthritis, unspecified: Secondary | ICD-10-CM | POA: Diagnosis not present

## 2021-09-01 DIAGNOSIS — R32 Unspecified urinary incontinence: Secondary | ICD-10-CM | POA: Diagnosis not present

## 2021-09-01 DIAGNOSIS — D631 Anemia in chronic kidney disease: Secondary | ICD-10-CM | POA: Diagnosis not present

## 2021-09-01 DIAGNOSIS — M4802 Spinal stenosis, cervical region: Secondary | ICD-10-CM | POA: Diagnosis not present

## 2021-09-01 DIAGNOSIS — N189 Chronic kidney disease, unspecified: Secondary | ICD-10-CM | POA: Diagnosis not present

## 2021-09-01 DIAGNOSIS — H52202 Unspecified astigmatism, left eye: Secondary | ICD-10-CM | POA: Diagnosis not present

## 2021-09-01 DIAGNOSIS — H4720 Unspecified optic atrophy: Secondary | ICD-10-CM | POA: Diagnosis not present

## 2021-09-01 DIAGNOSIS — N183 Chronic kidney disease, stage 3 unspecified: Secondary | ICD-10-CM | POA: Diagnosis not present

## 2021-09-01 DIAGNOSIS — I129 Hypertensive chronic kidney disease with stage 1 through stage 4 chronic kidney disease, or unspecified chronic kidney disease: Secondary | ICD-10-CM | POA: Diagnosis not present

## 2021-09-04 ENCOUNTER — Ambulatory Visit (INDEPENDENT_AMBULATORY_CARE_PROVIDER_SITE_OTHER): Payer: Medicare Other | Admitting: Family Medicine

## 2021-09-04 ENCOUNTER — Telehealth: Payer: Self-pay

## 2021-09-04 ENCOUNTER — Encounter: Payer: Self-pay | Admitting: Dermatology

## 2021-09-04 VITALS — BP 142/80 | HR 92 | Temp 97.6°F | Wt 123.5 lb

## 2021-09-04 DIAGNOSIS — E876 Hypokalemia: Secondary | ICD-10-CM | POA: Diagnosis not present

## 2021-09-04 DIAGNOSIS — M545 Low back pain, unspecified: Secondary | ICD-10-CM

## 2021-09-04 DIAGNOSIS — I10 Essential (primary) hypertension: Secondary | ICD-10-CM | POA: Diagnosis not present

## 2021-09-04 DIAGNOSIS — R42 Dizziness and giddiness: Secondary | ICD-10-CM

## 2021-09-04 DIAGNOSIS — G8929 Other chronic pain: Secondary | ICD-10-CM | POA: Diagnosis not present

## 2021-09-04 LAB — BASIC METABOLIC PANEL
BUN: 22 mg/dL (ref 6–23)
CO2: 31 mEq/L (ref 19–32)
Calcium: 9.3 mg/dL (ref 8.4–10.5)
Chloride: 104 mEq/L (ref 96–112)
Creatinine, Ser: 1.19 mg/dL (ref 0.40–1.20)
GFR: 42.93 mL/min — ABNORMAL LOW (ref >60.00)
Glucose, Bld: 98 mg/dL (ref 70–99)
Potassium: 5 mEq/L (ref 3.5–5.1)
Sodium: 142 mEq/L (ref 135–145)

## 2021-09-04 NOTE — Telephone Encounter (Signed)
Spoke with the patient. She is aware of Dr. Burchette's message.  °

## 2021-09-04 NOTE — Progress Notes (Signed)
Established Patient Office Visit  Subjective:  Patient ID: [name], female    DOB: 29-Jun-1941  Age: 80 y.o. MRN: [id number]  CC:  Chief Complaint  Patient presents with   Follow-up    HPI [name] presents for medical follow-up.  She has continued to lose a little weight.  She is actually happy with her recent weight loss though she states her appetite is not very good.  She states for years she has eaten just 2 meals per day.  Does not use any nutritional supplements.  Blood pressure has been running high by several home readings.  She had a high reading of 174/80 and several readings 951 systolic.  She had previously been on amlodipine but has not been on this for some time.  This is still on her med list.  She denies any alcohol use.  Tries to watch sodium intake.  Has had some recent intermittent headaches.  She had hypokalemia with potassium 3.3 last check.  Does take furosemide.  Is also on potassium replacement.  Recent vertigo symptoms.  Duration about a week and a half.  Symptoms worse early morning.  Denies any syncope or presyncope symptoms.  Symptoms tend to get better through the day.  No sudden hearing loss.  No ataxia.  No focal weakness.  No nausea or vomiting.  She has chronic pain and is treated with low-dose OxyContin.  She has history of severe osteoarthritis and has been on pain medication for years.  Past Medical History:  Diagnosis Date   Allergic rhinitis    Anemia    Anxiety    Bronchiectasis    Carpal tunnel syndrome 07/13/2015   Bilateral   CVA (cerebral infarction) 10/2007   Right thalamic    Diverticulosis of colon    Gait disorder    GERD (gastroesophageal reflux disease)    History of breast cancer    HTN (hypertension)    Hyperlipidemia    Idiopathic pulmonary fibrosis    Left knee DJD    Migraine    Osteoporosis    Peripheral neuropathy    PMR (polymyalgia rheumatica) (HCC)    Polyneuropathy in other diseases classified  elsewhere ([place]) 02/17/2013   Previous back surgery 10/09/12   Renal insufficiency    Rheumatoid arthritis (HCC)    Seizure disorder (HCC)    Spondylosis, cervical, with myelopathy 08/31/2015   C3-4 myelopathy   Temporal arteritis (HCC)    Right eye blind, on steroids per [facility]/ Dr [name]   Type II or unspecified type diabetes mellitus without mention of complication, not stated as uncontrolled    2nd to steriods   Vitamin D deficiency     Past Surgical History:  Procedure Laterality Date   APPENDECTOMY  01/2021   CARPAL TUNNEL RELEASE Right    CATARACT EXTRACTION     OS - Summer 2010   CERVICAL FUSION  09/24/2008   4 rods and pins in place   CHOLECYSTECTOMY     COLONOSCOPY  12/15/2011   Procedure: COLONOSCOPY;  Surgeon: [name], MD;  Location: [facility];  Service: Endoscopy;  Laterality: N/A;   LUMBAR FUSION  04/24/2010   W/Mechanical fixation - [facility]   LUMBAR FUSION  09/25/2011   rods in hips (to stabilize).   MASTECTOMY     NECK SURGERY     rheumatoid nodule removal     SPINAL FUSION  07/25/2010   T10-L2 interbody fusion / [place]  TOTAL KNEE ARTHROPLASTY     Left    Family History  Problem Relation Age of Onset   Brain cancer Mother    Hypertension Mother    Arthritis Mother    Dementia Father    Hypertension Father    Arthritis Father    Breast cancer Sister    Lung cancer Sister    Lung disease Neg Hx    Rheumatologic disease Neg Hx     Social History   Socioeconomic History   Marital status: Widowed    Spouse name: Not on file   Number of children: 1   Years of education: 12+ coll.   Highest education level: Not on file  Occupational History   Occupation: Education officer, environmental: RETIRED    Comment: retired  Tobacco Use   Smoking status: Never   Smokeless tobacco: Never   Tobacco comments:    Father   Vaping Use   Vaping Use: Never used  Substance and  Sexual Activity   Alcohol use: No    Alcohol/week: 0.0 standard drinks    Comment: heavy drinker until 1995 - sobriety with AA   Drug use: No   Sexual activity: Not Currently  Other Topics Concern   Not on file  Social History Narrative   07/03/21 friend [name] living with her   Right handed   Drinks 6-8 cups caffeine daily      Social Determinants of Health   Financial Resource Strain: Low Risk    Difficulty of Paying Living Expenses: Not hard at all  Food Insecurity: No Food Insecurity   Worried About Charity fundraiser in the Last Year: Never true   Arboriculturist in the Last Year: Never true  Transportation Needs: No Transportation Needs   Lack of Transportation (Medical): No   Lack of Transportation (Non-Medical): No  Physical Activity: Inactive   Days of Exercise per Week: 0 days   Minutes of Exercise per Session: 0 min  Stress: No Stress Concern Present   Feeling of Stress : Not at all  Social Connections: Socially Isolated   Frequency of Communication with Friends and Family: Three times a week   Frequency of Social Gatherings with Friends and Family: Three times a week   Attends Religious Services: Never   Active Member of Clubs or Organizations: No   Attends Archivist Meetings: Never   Marital Status: Widowed  Human resources officer Violence: Not At Risk   Fear of Current or Ex-Partner: No   Emotionally Abused: No   Physically Abused: No   Sexually Abused: No    Outpatient Medications Prior to Visit  Medication Sig Dispense Refill   amLODipine (NORVASC) 5 MG tablet TAKE 1 TABLET DAILY 90 tablet 3   Calcium Carb-Cholecalciferol (CALCIUM CARBONATE-VITAMIN D3 PO) Take 1 tablet by mouth daily     cholecalciferol (VITAMIN D) 1000 UNITS tablet Take 1,000 Units by mouth daily.     denosumab (PROLIA) 60 MG/ML SOLN injection Inject 60 mg into the skin every 6 (six) months.     diclofenac sodium (VOLTAREN) 1 % GEL APPLY 2 GRAMS TOPICALLY FOUR TIMES A DAY 400  g 6   ferrous sulfate 325 (65 FE) MG tablet Take 325 mg by mouth 2 (two) times daily.     fluticasone (FLONASE) 50 MCG/ACT nasal spray Place 2 sprays into both nostrils daily. 16 g 6   furosemide (LASIX) 40 MG tablet Take 1 tablet (40  mg total) by mouth daily. 90 tablet 0   gabapentin (NEURONTIN) 300 MG capsule TAKE 2 CAPSULES TWICE A DAY (KEEP APPOINTMENT SCHEDULED) 360 capsule 0   Golimumab (SIMPONI ARIA IV) Inject into the vein. Takes infusion every 8 wks.     Multiple Vitamins-Minerals (MULTIVITAMIN,TX-MINERALS) tablet Take 1 tablet by mouth daily.     oxyCODONE (OXYCONTIN) 10 mg 12 hr tablet Take 1 tablet (10 mg total) by mouth every 12 (twelve) hours. 60 tablet 0   oxyCODONE (OXYCONTIN) 10 mg 12 hr tablet Take 1 tablet (10 mg total) by mouth every 12 (twelve) hours. 60 tablet 0   oxyCODONE (OXYCONTIN) 10 mg 12 hr tablet Take 1 tablet (10 mg total) by mouth every 12 (twelve) hours. 60 tablet 0   Oxycodone HCl 10 MG TABS Take 1 tablet (10 mg total) by mouth every 6 (six) hours as needed. Take 1 tablet by mouth every 6 hours as needed for breakthrough pain 90 tablet 0   potassium chloride (KLOR-CON) 10 MEQ tablet TAKE 1 TABLET TWICE A DAY 180 tablet 1   predniSONE (DELTASONE) 1 MG tablet TAKE 4 TABLETS DAILY WITH BREAKFAST 360 tablet 3   simvastatin (ZOCOR) 5 MG tablet TAKE 1 TABLET AT BEDTIME 90 tablet 1   No facility-administered medications prior to visit.    Allergies  Allergen Reactions   Hydromorphone Other (See Comments)    Cognitive changes  "made me unconscious"    ROS Review of Systems  Constitutional:  Negative for chills and fever.  Eyes:  Negative for visual disturbance.  Respiratory:  Negative for cough, chest tightness, shortness of breath and wheezing.   Cardiovascular:  Negative for chest pain, palpitations and leg swelling.  Genitourinary:  Negative for dysuria.  Neurological:  Positive for dizziness. Negative for seizures, syncope, weakness, light-headedness and  headaches.     Objective:    Physical Exam Constitutional:      Appearance: She is well-developed.  Eyes:     Pupils: Pupils are equal, round, and reactive to light.  Neck:     Thyroid: No thyromegaly.     Vascular: No JVD.  Cardiovascular:     Rate and Rhythm: Normal rate and regular rhythm.     Heart sounds:    No gallop.  Pulmonary:     Effort: Pulmonary effort is normal. No respiratory distress.     Breath sounds: Normal breath sounds. No wheezing or rales.  Musculoskeletal:     Cervical back: Neck supple.     Right lower leg: No edema.     Left lower leg: No edema.  Neurological:     General: No focal deficit present.     Mental Status: She is alert.     Cranial Nerves: No cranial nerve deficit.     Comments: No focal weakness.  No ataxia.    BP (!) 142/80 (BP Location: Left Arm, Patient Position: Sitting, Cuff Size: Normal)   Pulse 92   Temp 97.6 F (36.4 C) (Oral)   Wt 123 lb 8 oz (56 kg)   SpO2 92%   BMI 23.34 kg/m  Wt Readings from Last 3 Encounters:  09/04/21 123 lb 8 oz (56 kg)  07/14/21 127 lb (57.6 kg)  07/03/21 129 lb (58.5 kg)     Health Maintenance Due  Topic Date Due   OPHTHALMOLOGY EXAM  Never done   Zoster Vaccines- Shingrix (1 of 2) Never done   FOOT EXAM  04/26/2018   HEMOGLOBIN A1C  04/29/2018  COVID-19 Vaccine (3 - Pfizer risk series) 12/27/2019    There are no preventive care reminders to display for this patient.  Lab Results  Component Value Date   TSH 3.17 07/14/2021   Lab Results  Component Value Date   WBC 9.1 07/14/2021   HGB 13.0 07/14/2021   HCT 40.1 07/14/2021   MCV 90.9 07/14/2021   PLT 253.0 07/14/2021   Lab Results  Component Value Date   NA 139 07/14/2021   K 3.3 (L) 07/14/2021   CO2 32 07/14/2021   GLUCOSE 85 07/14/2021   BUN 24 (H) 07/14/2021   CREATININE 1.33 (H) 07/14/2021   BILITOT 0.7 07/14/2021   ALKPHOS 86 07/14/2021   AST 21 07/14/2021   ALT 10 07/14/2021   PROT 7.5 07/14/2021   ALBUMIN  3.8 07/14/2021   CALCIUM 10.0 07/14/2021   ANIONGAP 10 09/14/2020   EGFR 41 (L) 12/29/2020   GFR 37.61 (L) 07/14/2021   Lab Results  Component Value Date   CHOL 144 10/18/2020   Lab Results  Component Value Date   HDL 62.50 10/18/2020   Lab Results  Component Value Date   LDLCALC 63 10/18/2020   Lab Results  Component Value Date   TRIG 92.0 10/18/2020   Lab Results  Component Value Date   CHOLHDL 2 10/18/2020   Lab Results  Component Value Date   HGBA1C 5.3 10/30/2017      Assessment & Plan:   #1 hypertension.  Poorly controlled.  Repeat reading here after rest left arm seated 144/70.  Start back amlodipine 5 mg daily.  Monitor blood pressure and be in touch if not consistently less than 140/90 in the next few weeks  #2 hypokalemia by recent labs.  Patient on furosemide.  Recheck basic metabolic panel  #3 recent onset vertigo.  Nonfocal neuro exam currently.  We recommend watching this another week.  If not improving further at that point consider getting back into see her neurologist sooner.  #4 chronic neck and back pain.  Patient on low-dose OxyContin.  Pain symptoms are stable.  She does not need refills at this time.   No orders of the defined types were placed in this encounter.   Follow-up: Return in about 3 months (around 12/03/2021).    Carolann Littler, MD

## 2021-09-04 NOTE — Telephone Encounter (Signed)
Patient called because she forgot to ask during office visit if Dr. Elease Hashimoto thinks it's ok for patient to drive she hasn't driven since May.

## 2021-09-04 NOTE — Progress Notes (Signed)
   Follow-Up Visit   Subjective  [name] is a 80 y.o. female who presents for the following: Annual Exam (Waist line and back itchy keratoses?).  Normal skin examination, multiple keratoses, several areas that itch Location:  Duration:  Quality:  Associated Signs/Symptoms: Modifying Factors:  Severity:  Timing: Context:   Objective  Well appearing patient in no apparent distress; mood and affect are within normal limits. Left Inguinal Area, Right Inguinal Area Pruritus lower torso and areas without visible rash or inflamed keratoses.  This may be a variant of neurogenic pruritus or perhaps a physical urticaria related to pressure from clothing.  Left Lower Back, Left Lower Leg - Anterior, Right Lower Back Multiple tan textured flattopped 4 to 10 mm papules with typical dermoscopy.  Scalp Patient describes tingling, itching on scalp.  Once again, no objective visible scale, inflammation, or sign of psoriasis or eczema.    A full examination was performed including scalp, head, eyes, ears, nose, lips, neck, chest, axillae, abdomen, back, buttocks, bilateral upper extremities, bilateral lower extremities, hands, feet, fingers, toes, fingernails, and toenails. All findings within normal limits unless otherwise noted below.  Areas beneath undergarments not fully examined.   Assessment & Plan    Pruritus (2) Left Inguinal Area; Right Inguinal Area  Initially she will try an over-the-counter cream containing pramoxine like CeraVe itch relief.  Seborrheic keratosis (3) Left Lower Leg - Anterior; Left Lower Back; Right Lower Back  I will leave if clinically stable  Dysesthesia Scalp  May try over-the-counter Scalpicin lotion.  Although this may respond to medicines used for peripheral neuropathy like gabapentin, other potential side effects may outweigh any benefit so for now no prescription given.      I, Lavonna Monarch, MD, have reviewed all documentation for  this visit.  The documentation on 09/04/21 for the exam, diagnosis, procedures, and orders are all accurate and complete.

## 2021-09-05 DIAGNOSIS — K5790 Diverticulosis of intestine, part unspecified, without perforation or abscess without bleeding: Secondary | ICD-10-CM | POA: Diagnosis not present

## 2021-09-05 DIAGNOSIS — K3532 Acute appendicitis with perforation and localized peritonitis, without abscess: Secondary | ICD-10-CM | POA: Diagnosis not present

## 2021-09-05 DIAGNOSIS — R32 Unspecified urinary incontinence: Secondary | ICD-10-CM | POA: Diagnosis not present

## 2021-09-05 DIAGNOSIS — M069 Rheumatoid arthritis, unspecified: Secondary | ICD-10-CM | POA: Diagnosis not present

## 2021-09-05 DIAGNOSIS — K5900 Constipation, unspecified: Secondary | ICD-10-CM | POA: Diagnosis not present

## 2021-09-05 DIAGNOSIS — I10 Essential (primary) hypertension: Secondary | ICD-10-CM | POA: Diagnosis not present

## 2021-09-11 ENCOUNTER — Telehealth: Payer: Self-pay | Admitting: Family Medicine

## 2021-09-11 DIAGNOSIS — L659 Nonscarring hair loss, unspecified: Secondary | ICD-10-CM

## 2021-09-11 NOTE — Telephone Encounter (Signed)
Pt seen dr Elease Hashimoto in oct for alopecia pt is still having hair loss and would like dr Elease Hashimoto to refer her to dermatologist. Pt has medicare and tricare for life insurance

## 2021-09-11 NOTE — Telephone Encounter (Signed)
Referral has been placed. Patient has been made aware.

## 2021-09-19 ENCOUNTER — Other Ambulatory Visit: Payer: Self-pay | Admitting: Family Medicine

## 2021-09-26 ENCOUNTER — Other Ambulatory Visit: Payer: Self-pay

## 2021-09-26 DIAGNOSIS — Z111 Encounter for screening for respiratory tuberculosis: Secondary | ICD-10-CM | POA: Diagnosis not present

## 2021-09-26 DIAGNOSIS — Z79899 Other long term (current) drug therapy: Secondary | ICD-10-CM | POA: Diagnosis not present

## 2021-09-26 DIAGNOSIS — M0589 Other rheumatoid arthritis with rheumatoid factor of multiple sites: Secondary | ICD-10-CM | POA: Diagnosis not present

## 2021-09-26 DIAGNOSIS — R5383 Other fatigue: Secondary | ICD-10-CM | POA: Diagnosis not present

## 2021-09-26 MED ORDER — PREDNISONE 1 MG PO TABS: ORAL_TABLET | ORAL | 3 refills | Status: DC

## 2021-09-26 NOTE — Progress Notes (Signed)
Rx refilled.

## 2021-09-28 ENCOUNTER — Other Ambulatory Visit: Payer: Self-pay | Admitting: Family Medicine

## 2021-10-18 DIAGNOSIS — L650 Telogen effluvium: Secondary | ICD-10-CM | POA: Diagnosis not present

## 2021-10-23 DIAGNOSIS — Z1231 Encounter for screening mammogram for malignant neoplasm of breast: Secondary | ICD-10-CM | POA: Diagnosis not present

## 2021-10-23 LAB — HM MAMMOGRAPHY

## 2021-10-25 ENCOUNTER — Ambulatory Visit (INDEPENDENT_AMBULATORY_CARE_PROVIDER_SITE_OTHER): Payer: Medicare Other | Admitting: Podiatry

## 2021-10-25 ENCOUNTER — Telehealth: Payer: Self-pay | Admitting: Family Medicine

## 2021-10-25 ENCOUNTER — Other Ambulatory Visit: Payer: Self-pay

## 2021-10-25 ENCOUNTER — Encounter: Payer: Self-pay | Admitting: Podiatry

## 2021-10-25 ENCOUNTER — Ambulatory Visit (INDEPENDENT_AMBULATORY_CARE_PROVIDER_SITE_OTHER): Payer: Medicare Other

## 2021-10-25 DIAGNOSIS — M778 Other enthesopathies, not elsewhere classified: Secondary | ICD-10-CM | POA: Diagnosis not present

## 2021-10-25 DIAGNOSIS — L84 Corns and callosities: Secondary | ICD-10-CM | POA: Diagnosis not present

## 2021-10-25 MED ORDER — TRIAMCINOLONE ACETONIDE 10 MG/ML IJ SUSP
10.0000 mg | Freq: Once | INTRAMUSCULAR | Status: AC
  Administered 2021-10-25: 10 mg

## 2021-10-25 NOTE — Telephone Encounter (Signed)
[id number] - [address], [place]

## 2021-10-25 NOTE — Telephone Encounter (Signed)
Last filled 07/12/2021 Last OV 09/04/2021  Ok to fill?

## 2021-10-25 NOTE — Telephone Encounter (Signed)
Patient is requesting a medication refill for oxyCODONE (OXYCONTIN) 10 mg 12 hr tablet [[id number]]  to be sent to her pharmacy.  Patient could be contacted at [phone].  Patient is scheduled for 02/03.  Please advise.

## 2021-10-25 NOTE — Progress Notes (Signed)
Subjective:   Patient ID: [name], female   DOB: 81 y.o.   MRN: [id number]   HPI Patient presents with caregiver with severe collapse medial longitudinal arch left with inflammation and pain and lesion formation.  Also has relative normal arch right and states that she can get some discomfort with that foot also and states the left has been doing this for over a year and patient does not smoke likes to be active   Review of Systems  All other systems reviewed and are negative.      Objective:  Physical Exam Vitals and nursing note reviewed.  Constitutional:      Appearance: She is well-developed.  Pulmonary:     Effort: Pulmonary effort is normal.  Musculoskeletal:        General: Normal range of motion.  Skin:    General: Skin is warm.  Neurological:     Mental Status: She is alert.    Neurovascular status was found to be intact muscle strength adequate with patient's left arch completely collapsed and patient is basically walking on her navicular but is developed inflammation and fluid buildup underneath this area.  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  Patient will be seen back for that visit and is to question and come in with any issues that do occur  X-rays indicate severe collapse medial longitudinal arch left with normal collapse right with navicular that is quite prominent plantarly

## 2021-10-26 ENCOUNTER — Encounter: Payer: Self-pay | Admitting: Family Medicine

## 2021-10-26 NOTE — Telephone Encounter (Signed)
Pt has an appt tomorrow and she is aware md out of office today

## 2021-10-27 ENCOUNTER — Ambulatory Visit (INDEPENDENT_AMBULATORY_CARE_PROVIDER_SITE_OTHER): Payer: Medicare Other | Admitting: Family Medicine

## 2021-10-27 VITALS — BP 136/70 | HR 75 | Temp 98.2°F | Wt 123.5 lb

## 2021-10-27 DIAGNOSIS — R5383 Other fatigue: Secondary | ICD-10-CM

## 2021-10-27 DIAGNOSIS — I10 Essential (primary) hypertension: Secondary | ICD-10-CM | POA: Diagnosis not present

## 2021-10-27 DIAGNOSIS — M545 Low back pain, unspecified: Secondary | ICD-10-CM

## 2021-10-27 DIAGNOSIS — G8929 Other chronic pain: Secondary | ICD-10-CM

## 2021-10-27 DIAGNOSIS — G4700 Insomnia, unspecified: Secondary | ICD-10-CM

## 2021-10-27 MED ORDER — OXYCODONE HCL ER 10 MG PO T12A: 10.0000 mg | EXTENDED_RELEASE_TABLET | Freq: Two times a day (BID) | ORAL | 0 refills | Status: DC

## 2021-10-27 NOTE — Progress Notes (Signed)
Established Patient Office Visit  Subjective:  Patient ID: [name], female    DOB: [birth year]  Age: 81 y.o. MRN: [id number]  CC:  Chief Complaint  Patient presents with   Medication Refill    HPI [name] presents for several items as follows  She has chronic neck and back pain.  She has been on OxyContin 10 mg twice daily for many years.  No history of misuse.  Pain fairly stable with current pain medications.  She does have chronic daily pain and states this pain would be debilitating without her medication.  She does have some recent increased insomnia issues.  She has had some issues for quite some time with this.  Frequently gets about 4 hours sleep at night.  Avoids late day use of caffeine.  No alcohol.  Does play games on her cell phone frequently at night before going to bed.  No daytime naps.  Has not tried any supplements such as melatonin.  She has hypertension which is treated with amlodipine.  Her blood pressures been fairly well controlled.  No recent dizziness.  Her weight is stable compared to last visit.  She lost some weight following emergent appendectomy last year.  She states her appetite has been slow to improve.  She expressed some concerns regarding memory.  Not confused regarding day of week and that sort of thing but really just feels like cognitively she is slightly slower than she used to be.  Past Medical History:  Diagnosis Date   Allergic rhinitis    Anemia    Anxiety    Bronchiectasis    Carpal tunnel syndrome 07/13/2015   Bilateral   CVA (cerebral infarction) 10/2007   Right thalamic    Diverticulosis of colon    Gait disorder    GERD (gastroesophageal reflux disease)    History of breast cancer    HTN (hypertension)    Hyperlipidemia    Idiopathic pulmonary fibrosis    Left knee DJD    Migraine    Osteoporosis    Peripheral neuropathy    PMR (polymyalgia rheumatica) (HCC)    Polyneuropathy in other diseases classified  elsewhere ([place]) 02/17/2013   Previous back surgery 10/09/12   Renal insufficiency    Rheumatoid arthritis (HCC)    Seizure disorder (HCC)    Spondylosis, cervical, with myelopathy 08/31/2015   C3-4 myelopathy   Temporal arteritis (HCC)    Right eye blind, on steroids per [facility]/ Dr [name]   Type II or unspecified type diabetes mellitus without mention of complication, not stated as uncontrolled    2nd to steriods   Vitamin D deficiency     Past Surgical History:  Procedure Laterality Date   APPENDECTOMY  01/2021   CARPAL TUNNEL RELEASE Right    CATARACT EXTRACTION     OS - Summer 2010   CERVICAL FUSION  09/24/2008   4 rods and pins in place   CHOLECYSTECTOMY     COLONOSCOPY  12/15/2011   Procedure: COLONOSCOPY;  Surgeon: [name], MD;  Location: [facility];  Service: Endoscopy;  Laterality: N/A;   LUMBAR FUSION  04/24/2010   W/Mechanical fixation - [facility]   LUMBAR FUSION  09/25/2011   rods in hips (to stabilize).   MASTECTOMY     NECK SURGERY     rheumatoid nodule removal     SPINAL FUSION  07/25/2010   T10-L2 interbody fusion / [place]  ARTHROPLASTY     Left    Family History  Problem Relation Age of Onset   Brain cancer Mother    Hypertension Mother    Arthritis Mother    Dementia Father    Hypertension Father    Arthritis Father    Breast cancer Sister    Lung cancer Sister    Lung disease Neg Hx    Rheumatologic disease Neg Hx     Social History   Socioeconomic History   Marital status: Widowed    Spouse name: Not on file   Number of children: 1   Years of education: 12+ coll.   Highest education level: Not on file  Occupational History   Occupation: Education officer, environmental: RETIRED    Comment: retired  Tobacco Use   Smoking status: Never   Smokeless tobacco: Never   Tobacco comments:    Father   Vaping Use   Vaping Use: Never used  Substance and  Sexual Activity   Alcohol use: No    Alcohol/week: 0.0 standard drinks    Comment: heavy drinker until 1995 - sobriety with AA   Drug use: No   Sexual activity: Not Currently  Other Topics Concern   Not on file  Social History Narrative   07/03/21 friend [name] living with her   Right handed   Drinks 6-8 cups caffeine daily      Social Determinants of Health   Financial Resource Strain: Low Risk    Difficulty of Paying Living Expenses: Not hard at all  Food Insecurity: No Food Insecurity   Worried About Charity fundraiser in the Last Year: Never true   Arboriculturist in the Last Year: Never true  Transportation Needs: No Transportation Needs   Lack of Transportation (Medical): No   Lack of Transportation (Non-Medical): No  Physical Activity: Inactive   Days of Exercise per Week: 0 days   Minutes of Exercise per Session: 0 min  Stress: No Stress Concern Present   Feeling of Stress : Not at all  Social Connections: Socially Isolated   Frequency of Communication with Friends and Family: Three times a week   Frequency of Social Gatherings with Friends and Family: Three times a week   Attends Religious Services: Never   Active Member of Clubs or Organizations: No   Attends Archivist Meetings: Never   Marital Status: Widowed  Human resources officer Violence: Not At Risk   Fear of Current or Ex-Partner: No   Emotionally Abused: No   Physically Abused: No   Sexually Abused: No    Outpatient Medications Prior to Visit  Medication Sig Dispense Refill   amLODipine (NORVASC) 5 MG tablet TAKE 1 TABLET DAILY 90 tablet 3   Calcium Carb-Cholecalciferol (CALCIUM CARBONATE-VITAMIN D3 PO) Take 1 tablet by mouth daily     cholecalciferol (VITAMIN D) 1000 UNITS tablet Take 1,000 Units by mouth daily.     denosumab (PROLIA) 60 MG/ML SOLN injection Inject 60 mg into the skin every 6 (six) months.     diclofenac sodium (VOLTAREN) 1 % GEL APPLY 2 GRAMS TOPICALLY FOUR TIMES A DAY 400  g 6   ferrous sulfate 325 (65 FE) MG tablet Take 325 mg by mouth 2 (two) times daily.     fluticasone (FLONASE) 50 MCG/ACT nasal spray Place 2 sprays into both nostrils daily. 16 g 6   furosemide (LASIX) 40 MG tablet TAKE 1 TABLET DAILY 90 tablet  3   gabapentin (NEURONTIN) 300 MG capsule TAKE 2 CAPSULES TWICE A DAY (KEEP APPOINTMENT SCHEDULED) 360 capsule 0   Golimumab (SIMPONI ARIA IV) Inject into the vein. Takes infusion every 8 wks.     Multiple Vitamins-Minerals (MULTIVITAMIN,TX-MINERALS) tablet Take 1 tablet by mouth daily.     Oxycodone HCl 10 MG TABS Take 1 tablet (10 mg total) by mouth every 6 (six) hours as needed. Take 1 tablet by mouth every 6 hours as needed for breakthrough pain 90 tablet 0   potassium chloride (KLOR-CON M) 10 MEQ tablet Take 1 tablet (10 mEq total) by mouth 2 (two) times daily. 180 tablet 1   predniSONE (DELTASONE) 1 MG tablet TAKE 4 TABLETS DAILY WITH BREAKFAST 360 tablet 3   simvastatin (ZOCOR) 5 MG tablet TAKE 1 TABLET AT BEDTIME 90 tablet 1   oxyCODONE (OXYCONTIN) 10 mg 12 hr tablet Take 1 tablet (10 mg total) by mouth every 12 (twelve) hours. 60 tablet 0   oxyCODONE (OXYCONTIN) 10 mg 12 hr tablet Take 1 tablet (10 mg total) by mouth every 12 (twelve) hours. 60 tablet 0   oxyCODONE (OXYCONTIN) 10 mg 12 hr tablet Take 1 tablet (10 mg total) by mouth every 12 (twelve) hours. 60 tablet 0   No facility-administered medications prior to visit.    Allergies  Allergen Reactions   Hydromorphone Other (See Comments)    Cognitive changes  "made me unconscious"    ROS Review of Systems  Constitutional:  Positive for fatigue. Negative for unexpected weight change.  Eyes:  Negative for visual disturbance.  Respiratory:  Negative for cough, chest tightness, shortness of breath and wheezing.   Cardiovascular:  Negative for chest pain, palpitations and leg swelling.  Genitourinary:  Negative for dysuria.  Musculoskeletal:  Positive for back pain and neck pain.   Neurological:  Negative for dizziness, seizures, syncope, weakness, light-headedness and headaches.  Psychiatric/Behavioral:  Positive for sleep disturbance. Negative for confusion and dysphoric mood.      Objective:    Physical Exam Vitals reviewed.  Cardiovascular:     Rate and Rhythm: Normal rate.  Pulmonary:     Effort: Pulmonary effort is normal.     Breath sounds: Normal breath sounds.  Neurological:     General: No focal deficit present.     Mental Status: She is alert and oriented to person, place, and time.    BP 136/70 (BP Location: Left Arm, Patient Position: Sitting, Cuff Size: Normal)    Pulse 75    Temp 98.2 F (36.8 C) (Oral)    Wt 123 lb 8 oz (56 kg)    SpO2 97%    BMI 23.34 kg/m  Wt Readings from Last 3 Encounters:  10/27/21 123 lb 8 oz (56 kg)  09/04/21 123 lb 8 oz (56 kg)  07/14/21 127 lb (57.6 kg)     Health Maintenance Due  Topic Date Due   OPHTHALMOLOGY EXAM  Never done   Zoster Vaccines- Shingrix (1 of 2) Never done   FOOT EXAM  04/26/2018   HEMOGLOBIN A1C  04/29/2018   COVID-19 Vaccine (3 - Pfizer risk series) 12/27/2019    There are no preventive care reminders to display for this patient.  Lab Results  Component Value Date   TSH 3.17 07/14/2021   Lab Results  Component Value Date   WBC 9.1 07/14/2021   HGB 13.0 07/14/2021   HCT 40.1 07/14/2021   MCV 90.9 07/14/2021   PLT 253.0 07/14/2021   Lab Results  Component Value Date   NA 142 09/04/2021   K 5.0 09/04/2021   CO2 31 09/04/2021   GLUCOSE 98 09/04/2021   BUN 22 09/04/2021   CREATININE 1.19 09/04/2021   BILITOT 0.7 07/14/2021   ALKPHOS 86 07/14/2021   AST 21 07/14/2021   ALT 10 07/14/2021   PROT 7.5 07/14/2021   ALBUMIN 3.8 07/14/2021   CALCIUM 9.3 09/04/2021   ANIONGAP 10 09/14/2020   EGFR 41 (L) 12/29/2020   GFR 42.93 (L) 09/04/2021   Lab Results  Component Value Date   CHOL 144 10/18/2020   Lab Results  Component Value Date   HDL 62.50 10/18/2020   Lab  Results  Component Value Date   LDLCALC 63 10/18/2020   Lab Results  Component Value Date   TRIG 92.0 10/18/2020   Lab Results  Component Value Date   CHOLHDL 2 10/18/2020   Lab Results  Component Value Date   HGBA1C 5.3 10/30/2017      Assessment & Plan:   #1 chronic neck and low back pain.  Overall stable.  Refill OxyContin for 3 months.  We will plan urine drug screen with next visit in 3 months.  PDMP reviewed with no red flags.  #2 insomnia.  We discussed sleep hygiene in some detail.  Handout given.  Avoid bright lights within 2 to 3 hours of bedtime.  Recommend she consider trial of melatonin up to 5 mg nightly.  If not seeing any improvement with that could consider trial of low-dose trazodone.  #3 hypertension stable and at goal.  Continue current medications.  Meds ordered this encounter  Medications   oxyCODONE (OXYCONTIN) 10 mg 12 hr tablet    Sig: Take 1 tablet (10 mg total) by mouth every 12 (twelve) hours.    Dispense:  60 tablet    Refill:  0   oxyCODONE (OXYCONTIN) 10 mg 12 hr tablet    Sig: Take 1 tablet (10 mg total) by mouth every 12 (twelve) hours.    Dispense:  60 tablet    Refill:  0    May refill in 2 months   oxyCODONE (OXYCONTIN) 10 mg 12 hr tablet    Sig: Take 1 tablet (10 mg total) by mouth every 12 (twelve) hours.    Dispense:  60 tablet    Refill:  0    May refill in 1 month    Follow-up: Return in about 3 months (around 01/24/2022).    Carolann Littler, MD

## 2021-10-27 NOTE — Patient Instructions (Signed)
Consider trial of Melatonin for sleep- up to 5 mg at night.

## 2021-10-27 NOTE — Telephone Encounter (Signed)
Address at appointment today.

## 2021-11-02 ENCOUNTER — Telehealth: Payer: Self-pay | Admitting: Family Medicine

## 2021-11-02 NOTE — Telephone Encounter (Signed)
Patient called due to not having anymore refills on RX Amlodipne 5 [facility].  Patient request RX to be sent in

## 2021-11-02 NOTE — Telephone Encounter (Signed)
This was last filled in 2021. Ok to refill?

## 2021-11-03 MED ORDER — AMLODIPINE BESYLATE 5 MG PO TABS: 5.0000 mg | ORAL_TABLET | Freq: Every day | ORAL | 3 refills | Status: DC

## 2021-11-03 NOTE — Telephone Encounter (Signed)
Rx sent in

## 2021-11-06 ENCOUNTER — Other Ambulatory Visit: Payer: Self-pay

## 2021-11-06 ENCOUNTER — Ambulatory Visit (INDEPENDENT_AMBULATORY_CARE_PROVIDER_SITE_OTHER): Payer: Medicare Other

## 2021-11-06 DIAGNOSIS — L84 Corns and callosities: Secondary | ICD-10-CM

## 2021-11-06 DIAGNOSIS — M778 Other enthesopathies, not elsewhere classified: Secondary | ICD-10-CM | POA: Diagnosis not present

## 2021-11-06 NOTE — Progress Notes (Signed)
SITUATION Reason for Consult: Evaluation for Bilateral Custom Foot Orthoses Patient / Caregiver Report: Patient is ready for foot orthotics  OBJECTIVE DATA: Patient History / Diagnosis:    ICD-10-CM   1. Capsulitis of left foot  M77.8     2. Callus  L84       Current or Previous Devices: None and no history  Foot Examination: Skin presentation:   Intact Ulcers & Callousing:   None and no history Toe / Foot Deformities:  Left pes planovalgus with navicular collapse Weight Bearing Presentation:  Planus Sensation:    Intact  Shoe size: 75M  ORTHOTIC RECOMMENDATION Recommended Device: 1x pair of custom functional foot orthotics  GOALS OF ORTHOSES - Reduce Pain - Prevent Foot Deformity - Prevent Progression of Further Foot Deformity - Relieve Pressure - Improve the Overall Biomechanical Function of the Foot and Lower Extremity.  ACTIONS PERFORMED Patient was casted for Foot Orthoses via crush box. Procedure was explained and patient tolerated procedure well. All questions were answered and concerns addressed.  PLAN Potential out of pocket cost was communicated to patient. Casts are to be sent to [facility] for fabrication. Patient is to be called for fitting when devices are ready.

## 2021-11-15 ENCOUNTER — Other Ambulatory Visit: Payer: Self-pay | Admitting: Family Medicine

## 2021-11-21 DIAGNOSIS — M0589 Other rheumatoid arthritis with rheumatoid factor of multiple sites: Secondary | ICD-10-CM | POA: Diagnosis not present

## 2021-11-28 ENCOUNTER — Other Ambulatory Visit: Payer: Self-pay

## 2021-11-28 ENCOUNTER — Ambulatory Visit: Payer: Medicare Other

## 2021-11-28 DIAGNOSIS — L84 Corns and callosities: Secondary | ICD-10-CM

## 2021-11-28 DIAGNOSIS — M778 Other enthesopathies, not elsewhere classified: Secondary | ICD-10-CM

## 2021-11-28 NOTE — Progress Notes (Signed)
SITUATION: ?Reason for Visit: Fitting and Delivery of Custom Fabricated Foot Orthoses ?Patient Report: Patient reports comfort and is satisfied with device. ? ?OBJECTIVE DATA: ?Patient History / Diagnosis:   ?  ICD-10-CM   ?1. Capsulitis of left foot  M77.8   ?  ?2. Callus  L84   ?  ? ? ?Provided Device:  Custom Functional Foot Orthotics ?    [place] [id number] ? ?GOAL OF ORTHOSIS ?- Improve gait ?- Decrease energy expenditure ?- Improve Balance ?- Provide Triplanar stability of foot complex ?- Facilitate motion ? ?ACTIONS PERFORMED ?Patient was fit with foot orthotics trimmed to shoe last. Patient tolerated fittign procedure.  ? ?Patient was provided with verbal and written instruction and demonstration regarding donning, doffing, wear, care, proper fit, function, purpose, cleaning, and use of the orthosis and in all related precautions and risks and benefits regarding the orthosis. ? ?Patient was also provided with verbal instruction regarding how to report any failures or malfunctions of the orthosis and necessary follow up care. Patient was also instructed to contact our office regarding any change in status that may affect the function of the orthosis. ? ?Patient demonstrated independence with proper donning, doffing, and fit and verbalized understanding of all instructions. ? ?PLAN: ?Patient is to follow up in one week or as necessary (PRN). All questions were answered and concerns addressed. Plan of care was discussed with and agreed upon by the patient. ? ?

## 2021-12-28 ENCOUNTER — Telehealth: Payer: Self-pay | Admitting: *Deleted

## 2021-12-28 NOTE — Chronic Care Management (AMB) (Signed)
?  Care Management  ? ?Outreach Note ? ?12/28/2021 ?Name: LLEWELLYN SCHOENBERGER MRN: [id number] DOB: 05/20/1941 ? ?Referred by: Eulas Post, MD ?Reason for referral : Care Coordination (Initial outreach to schedule with RNCM ) ? ? ?An unsuccessful telephone outreach was attempted today. The patient was referred to the case management team for assistance with care management and care coordination.  ? ?Follow Up Plan:  ?A HIPAA compliant phone message was left for the patient providing contact information and requesting a return call.  ?The care management team will reach out to the patient again over the next 14 days.  ?If patient returns call to provider office, please advise to call [facility]* at [phone].* ? ?Laverda Sorenson  ?Care Guide, Embedded Care Coordination ?[facility]  Care Management  ?Direct Dial: [phone] ? ?

## 2022-01-01 NOTE — Chronic Care Management (AMB) (Signed)
?  Care Management  ? ?Outreach Note ? ?01/01/2022 ?Name: JAYLISE PEEK MRN: [id number] DOB: 1941-07-21 ? ?Referred by: Eulas Post, MD ?Reason for referral : Care Coordination (Initial outreach to schedule with RNCM ) ? ? ?A second unsuccessful telephone outreach was attempted today. The patient was referred to the case management team for assistance with care management and care coordination.  ? ?Follow Up Plan:  ?A HIPAA compliant phone message was left for the patient providing contact information and requesting a return call.  ? ?Nirel Babler, CCMA ?Care Guide, Embedded Care Coordination ?[facility]  Care Management  ?Direct Dial: [phone] ? ? ?

## 2022-01-02 ENCOUNTER — Ambulatory Visit: Payer: Medicare Other | Admitting: Neurology

## 2022-01-02 NOTE — Chronic Care Management (AMB) (Signed)
?  Care Management  ? ?Note ? ?01/02/2022 ?Name: VESTAL CRANDALL MRN: [id number] DOB: [birth year] ? ?MARLIS OLDAKER is a 81 y.o. year old female who is a primary care patient of Burchette, Alinda Sierras, MD. I reached out to [name] by phone today offer care coordination services.  ? ?Ms. Humphreys was given information about care management services today including:  ?Care management services include personalized support from designated clinical staff supervised by her physician, including individualized plan of care and coordination with other care providers ?24/7 contact phone numbers for assistance for urgent and routine care needs. ?The patient may stop care management services at any time by phone call to the office staff. ? ?Patient agreed to services and verbal consent obtained.  ? ?Follow up plan: ?Telephone appointment with care management team member scheduled for:  01/15/2022 ? ?Anika Shore, CCMA ?Care Guide, Embedded Care Coordination ?[facility]  Care Management  ?Direct Dial: [phone] ? ? ?

## 2022-01-08 ENCOUNTER — Telehealth: Payer: Self-pay | Admitting: Family Medicine

## 2022-01-08 NOTE — Telephone Encounter (Signed)
She just got this filled on 12-29-21 for #60- so we cannot send in yet.  She is due for 3 month chronic pain management follow up in early May.   Make sure she has follow up (either last week of this month or first week of May).  We can discuss changes going forward in pharmacy at that time.   ?

## 2022-01-08 NOTE — Telephone Encounter (Signed)
I spoke with the pt and she [facility] to [facility] at [facility].  ?

## 2022-01-08 NOTE — Telephone Encounter (Signed)
Pt needs to change pharmacy from [facility] to  ?[place], [place] Phone:  [phone]  ?Fax:  [phone]  ?  ? Patient requests a call from the nurse.  ?

## 2022-01-09 MED ORDER — OXYCODONE HCL ER 10 MG PO T12A: 10.0000 mg | EXTENDED_RELEASE_TABLET | Freq: Two times a day (BID) | ORAL | 0 refills | Status: DC

## 2022-01-09 NOTE — Telephone Encounter (Signed)
I sent in 1 month prescription to [facility] ?

## 2022-01-09 NOTE — Telephone Encounter (Signed)
Noted and pt aware 

## 2022-01-09 NOTE — Telephone Encounter (Signed)
I spoke with the pt and informed her of the message and pt expressed understanding. Pt stated she [facility] due to processing times taking 2 weeks and she reported she has approximately 15 days left of medication and she will run out before upcoming visit on 01/24/2022. Pt requested for message to be sent back to PCP.  ?

## 2022-01-09 NOTE — Addendum Note (Signed)
Addended by: Eulas Post on: 01/09/2022 01:45 PM ? ? Modules accepted: Orders ? ?

## 2022-01-11 DIAGNOSIS — M069 Rheumatoid arthritis, unspecified: Secondary | ICD-10-CM | POA: Diagnosis not present

## 2022-01-11 DIAGNOSIS — N2581 Secondary hyperparathyroidism of renal origin: Secondary | ICD-10-CM | POA: Diagnosis not present

## 2022-01-11 DIAGNOSIS — I129 Hypertensive chronic kidney disease with stage 1 through stage 4 chronic kidney disease, or unspecified chronic kidney disease: Secondary | ICD-10-CM | POA: Diagnosis not present

## 2022-01-11 DIAGNOSIS — N183 Chronic kidney disease, stage 3 unspecified: Secondary | ICD-10-CM | POA: Diagnosis not present

## 2022-01-11 DIAGNOSIS — D631 Anemia in chronic kidney disease: Secondary | ICD-10-CM | POA: Diagnosis not present

## 2022-01-11 DIAGNOSIS — N189 Chronic kidney disease, unspecified: Secondary | ICD-10-CM | POA: Diagnosis not present

## 2022-01-15 ENCOUNTER — Ambulatory Visit: Payer: Medicare Other

## 2022-01-15 DIAGNOSIS — I10 Essential (primary) hypertension: Secondary | ICD-10-CM

## 2022-01-15 DIAGNOSIS — M069 Rheumatoid arthritis, unspecified: Secondary | ICD-10-CM

## 2022-01-15 DIAGNOSIS — G8929 Other chronic pain: Secondary | ICD-10-CM

## 2022-01-15 NOTE — Patient Instructions (Signed)
Visit Information ? ?Thank you for taking time to visit with me today. Please don't hesitate to contact me if I can be of assistance to you before our next scheduled telephone appointment. ? ?Following are the goals we discussed today:  ?Take all medications as prescribed ?Attend all scheduled provider appointments ?Call pharmacy for medication refills 3-7 days in advance of running out of medications ?Perform all self care activities independently  ?Call provider office for new concerns or questions  ?check blood pressure weekly ?choose a place to take my blood pressure (home, clinic or office, retail store) ?write blood pressure results in a log or diary ?call doctor for signs and symptoms of high blood pressure ?begin an exercise program ?eat more whole grains, fruits and vegetables, lean meats and healthy fats ?limit salt intake to 2373m/day ?It is important to avoid accidents which may result in broken bones.  Here are a few ideas on how to make your home safer so you will be less likely to trip or fall. ? ?Use nonskid mats or non slip strips in your shower or tub, on your bathroom floor and around sinks.  If you know that you have spilled water, wipe it up! ?In the bathroom, it is important to have properly installed grab bars on the walls or on the edge of the tub.  Towel racks are NOT strong enough for you to hold onto or to pull on for support. ?Stairs and hallways should have enough light.  Add lamps or night lights if you need ore light. ?It is good to have handrails on both sides of the stairs if possible.  Always fix broken handrails right away. ?It is important to see the edges of steps.  Paint the edges of outdoor steps white so you can see them better.  Put colored tape on the edge of inside steps. ?Throw-rugs are dangerous because they can slide.  Removing the rugs is the best idea, but if they must stay, add adhesive carpet tape to prevent slipping. ?Do not keep things on stairs or in the halls.   Remove small furniture that blocks the halls as it may cause you to trip.  Keep telephone and electrical cords out of the way where you walk. ?Always were sturdy, rubber-soled shoes for good support.  Never wear just socks, especially on the stairs.  Socks may cause you to slip or fall.  Do not wear full-length housecoats as you can easily trip on the bottom.  ?Place the things you use the most on the shelves that are the easiest to reach.  If you use a stepstool, make sure it is in good condition.  If you feel unsteady, DO NOT climb, ask for help. ?If a health professional advises you to use a cane or walker, do not be ashamed.  These items can keep you from falling and breaking your bones. ?Our next appointment is by telephone on 03/01/22 at 11 AM ? ?Please call the care guide team at [phone] you need to cancel or reschedule your appointment.  ? ?If you are experiencing a Mental Health or [place] need someone to talk to, please call the Suicide and Crisis Lifeline: 988 ?call the [employer] Suicide Prevention Lifeline: 1206-517-0246or TTY: 1567 074 7310TTY [phone])337-9611 to talk to a trained counselor ?call 1-800-273-TALK (toll free, 24 hour hotline) ?go to [facility] [address] [place]([phone] ?call 911  ? ?Following is a copy of your full  plan of care:  ?Care Plan : [place] [facility] Care  ?Updates made by Dimitri Ped, RN since 01/15/2022 12:00 AM  ?  ? ?Problem: Disease Management and Care Coordination Needs(HTN, RA, chronic pain, osteoporosis)   ?Priority: High  ?  ? ?Long-Range Goal: Establish Plan of Care for Care Coordination and Disease Management Needs(HTN, RA, chronic pain, osteoporosis)   ?Start Date: 01/15/2022  ?Expected End Date: 01/15/2023  ?Priority: High  ?Note:   ?Current Barriers:  ?Knowledge Deficits related to plan of care for management of HTN, Osteoporosis, and RA, chronic pain  ?Chronic Disease  Management support and education needs related to HTN, Osteoporosis, and RA, chronic pain   ?States that she has recovered quite a bit since she was sick last yr.  States she now is staying alone and is independent taking care of herself.  States her back pain is controlled with her pain medications.  States she has had 2 falls in the last year.  States she has the information about getting an emergency call alarm but has not called to get yet.  States she does use her walker or Rolator all of the time.  States she does not check her B/P regularly as it has been good since she has lost about 100 lbs in the last few yrs.  ? ?RNCM Clinical Goal(s):  ?Patient will verbalize understanding of plan for management of HTN, Osteoporosis, and RA, chronic pain  as evidenced by voiced adherence to plan of care ?verbalize basic understanding of  HTN, Osteoporosis, and RA, chronic pain  disease process and self health management plan as evidenced by voiced understanding and teach back  ?take all medications exactly as prescribed and will call provider for medication related questions as evidenced by dispense report and pt verbalization  ?attend all scheduled medical appointments: Neurology 01/23/22, Dr. Elease Hashimoto 01/24/22 as evidenced by medical records ?demonstrate Improved adherence to prescribed treatment plan for HTN, Osteoporosis, and RA, chronic pain  as evidenced by readings within limits, voiced adherence to plan of care ?continue to work with RN Care Manager to address care management and care coordination needs related to  HTN, Osteoporosis, and RA, chronic pain  as evidenced by adherence to CM Team Scheduled appointments through collaboration with RN Care manager, provider, and care team.  ? ?Interventions: ?1:1 collaboration with primary care provider regarding development and update of comprehensive plan of care as evidenced by provider attestation and co-signature ?Inter-disciplinary care team collaboration (see  longitudinal plan of care) ?Evaluation of current treatment plan related to  self management and patient's adherence to plan as established by provider ? ? ?Falls Interventions:  (Status:  New goal.) Long Term Goal ?Provided written and verbal education re: potential causes of falls and Fall prevention strategies ?Reviewed medications and discussed potential side effects of medications such as dizziness and frequent urination ?Advised patient of importance of notifying provider of falls ?Provided patient information for fall alert systems ?Screening for signs and symptoms of depression related to chronic disease state  ?Assessed social determinant of health barriers ? ? ?Hypertension Interventions:  (Status:  New goal.) Long Term Goal ?Last practice recorded BP readings:  ?BP Readings from Last 3 Encounters:  ?10/27/21 136/70  ?09/04/21 (!) 142/80  ?07/14/21 (!) 142/70  ?Most recent eGFR/CrCl:  ?Lab Results  ?Component Value Date  ? EGFR 41 (L) 12/29/2020  ?  No components found for: CRCL ? ?Evaluation of current treatment plan related to hypertension self management and patient's adherence  to plan as established by provider ?Provided education to patient re: stroke prevention, s/s of heart attack and stroke ?Reviewed medications with patient and discussed importance of compliance ?Advised patient, providing education and rationale, to monitor blood pressure daily and record, calling PCP for findings outside established parameters ?Provided education on prescribed diet low sodium ? ?Pain/Rheumatoid Arthritis Interventions:  (Status:  New goal.) Long Term Goal ?Pain assessment performed ?Medications reviewed ?Reviewed provider established plan for pain management ?Discussed importance of adherence to all scheduled medical appointments ?Counseled on the importance of reporting any/all new or changed pain symptoms or management strategies to pain management provider ?Advised patient to report to care team affect of  pain on daily activities ?Discussed use of relaxation techniques and/or diversional activities to assist with pain reduction (distraction, imagery, relaxation, massage, acupressure, TENS, heat, and cold application

## 2022-01-15 NOTE — Chronic Care Management (AMB) (Signed)
? Care Management ?  ? RN Visit Note ? ?01/15/2022 ?Name: [name] MRN: [id number] DOB: February 25, 1941 ? ?Subjective: ?[name] is a 81 y.o. year old female who is a primary care patient of Burchette, Alinda Sierras, MD. The care management team was consulted for assistance with disease management and care coordination needs.   ? ?Engaged with patient by telephone for initial visit in response to provider referral for case management and/or care coordination services.  ? ?Consent to Services:  ? Ms. Game was given information about Care Management services today including:  ?Care Management services includes personalized support from designated clinical staff supervised by her physician, including individualized plan of care and coordination with other care providers ?24/7 contact phone numbers for assistance for urgent and routine care needs. ?The patient may stop case management services at any time by phone call to the office staff. ? ?Patient agreed to services and consent obtained.  ? ?Assessment: Review of patient past medical history, allergies, medications, health status, including review of consultants reports, laboratory and other test data, was performed as part of comprehensive evaluation and provision of chronic care management services.  ? ?SDOH (Social Determinants of Health) assessments and interventions performed:   ? ?Care Plan ? ?Allergies  ?Allergen Reactions  ? Hydromorphone Other (See Comments)  ?  Cognitive changes  ?"made me unconscious"  ? ? ?Outpatient Encounter Medications as of 01/15/2022  ?Medication Sig  ? amLODipine (NORVASC) 5 MG tablet Take 1 tablet (5 mg total) by mouth daily.  ? Calcium Carb-Cholecalciferol (CALCIUM CARBONATE-VITAMIN D3 PO) Take 1 tablet by mouth daily  ? cholecalciferol (VITAMIN D) 1000 UNITS tablet Take 1,000 Units by mouth daily.  ? denosumab (PROLIA) 60 MG/ML SOLN injection Inject 60 mg into the skin every 6 (six) months.  ? diclofenac sodium (VOLTAREN) 1 %  GEL APPLY 2 GRAMS TOPICALLY FOUR TIMES A DAY  ? fluticasone (FLONASE) 50 MCG/ACT nasal spray Place 2 sprays into both nostrils daily.  ? furosemide (LASIX) 40 MG tablet TAKE 1 TABLET DAILY  ? gabapentin (NEURONTIN) 300 MG capsule TAKE 2 CAPSULES TWICE A DAY (KEEP APPOINTMENT SCHEDULED)  ? Golimumab ([place] ARIA IV) Inject into the vein. Takes infusion every 8 wks.  ? Multiple Vitamins-Minerals (MULTIVITAMIN,TX-MINERALS) tablet Take 1 tablet by mouth daily.  ? oxyCODONE (OXYCONTIN) 10 mg 12 hr tablet Take 1 tablet (10 mg total) by mouth every 12 (twelve) hours.  ? potassium chloride (KLOR-CON M) 10 MEQ tablet Take 1 tablet (10 mEq total) by mouth 2 (two) times daily.  ? predniSONE (DELTASONE) 1 MG tablet TAKE 4 TABLETS DAILY WITH BREAKFAST  ? ferrous sulfate 325 (65 FE) MG tablet Take 325 mg by mouth 2 (two) times daily. (Patient not taking: Reported on 01/15/2022)  ? oxyCODONE (OXYCONTIN) 10 mg 12 hr tablet Take 1 tablet (10 mg total) by mouth every 12 (twelve) hours.  ? oxyCODONE (OXYCONTIN) 10 mg 12 hr tablet Take 1 tablet (10 mg total) by mouth every 12 (twelve) hours.  ? Oxycodone HCl 10 MG TABS Take 1 tablet (10 mg total) by mouth every 6 (six) hours as needed. Take 1 tablet by mouth every 6 hours as needed for breakthrough pain  ? simvastatin (ZOCOR) 5 MG tablet TAKE 1 TABLET AT BEDTIME (Patient not taking: Reported on 01/15/2022)  ? [DISCONTINUED] potassium chloride (K-DUR) 10 MEQ tablet Take 1 tablet (10 mEq total) by mouth 2 (two) times daily.  ? ?No facility-administered encounter medications on file as of 01/15/2022.  ? ? ?  Patient Active Problem List  ? Diagnosis Date Noted  ? Headache syndrome 01/03/2021  ? Subjective visual disturbance, left eye 12/29/2020  ? Exostosis of left foot 06/28/2020  ? 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 Pain management 10/30/2017  ? Rheumatoid arthritis ([place]) 04/25/2017  ? S/P cervical spinal fusion 09/05/2015  ? Spondylosis, cervical, with  myelopathy 08/31/2015  ? Carpal tunnel syndrome 07/13/2015  ? Torn ear lobe 06/07/2015  ? Obesity (BMI 30-39.9) 04/06/2014  ? Shortness of breath 07/14/2013  ? Bone cyst 07/14/2013  ? Polyneuropathy in other diseases classified elsewhere ([place]) 02/17/2013  ? Chronic back pain 12/09/2012  ? Peripheral edema 09/25/2012  ? Anemia due to chronic illness 09/01/2012  ? DDD (degenerative disc disease), lumbosacral 08/12/2012  ? Cervicalgia 07/11/2012  ? Disturbance of skin sensation 07/11/2012  ? Temporal arteritis ([place]) 04/01/2012  ? Lower GI bleed 12/24/2011  ? Rectal bleed 12/13/2011  ? Arthrodesis status 09/04/2011  ? Chronic renal insufficiency, stage II (mild) 04/04/2011  ? LUMBAR RADICULOPATHY, LEFT 04/18/2009  ? Essential hypertension 12/30/2007  ? SEIZURE DISORDER 12/30/2007  ? CEREBROVASCULAR ACCIDENT, HX OF 12/30/2007  ? Hyperlipidemia 10/08/2007  ? GERD 10/08/2007  ? DIVERTICULOSIS, COLON 10/08/2007  ? BRONCHIECTASIS 06/26/2007  ? PULMONARY FIBROSIS 05/17/2007  ? Osteoporosis 05/17/2007  ? BREAST CANCER, HX OF 05/17/2007  ? MASTECTOMY, LEFT, HX OF 05/17/2007  ? ? ?Conditions to be addressed/monitored: HTN, Osteoporosis, and RA, chronic pain ? ?Care Plan : RN Care Manager Plan of Care  ?Updates made by Dimitri Ped, RN since 01/15/2022 12:00 AM  ?  ? ?Problem: Disease Management and Care Coordination Needs(HTN, RA, chronic pain, osteoporosis)   ?Priority: High  ?  ? ?Long-Range Goal: Establish Plan of Care for Care Coordination and Disease Management Needs(HTN, RA, chronic pain, osteoporosis)   ?Start Date: 01/15/2022  ?Expected End Date: 01/15/2023  ?Priority: High  ?Note:   ?Current Barriers:  ?Knowledge Deficits related to plan of care for management of HTN, Osteoporosis, and RA, chronic pain  ?Chronic Disease Management support and education needs related to HTN, Osteoporosis, and RA, chronic pain   ?States that she has recovered quite a bit since she was sick last yr.  States she now is staying alone and  is independent taking care of herself.  States her back pain is controlled with her pain medications.  States she has had 2 falls in the last year.  States she has the information about getting an emergency call alarm but has not called to get yet.  States she does use her walker or Rolator all of the time.  States she does not check her B/P regularly as it has been good since she has lost about 100 lbs in the last few yrs.  ? ?RNCM Clinical Goal(s):  ?Patient will verbalize understanding of plan for management of HTN, Osteoporosis, and RA, chronic pain  as evidenced by voiced adherence to plan of care ?verbalize basic understanding of  HTN, Osteoporosis, and RA, chronic pain  disease process and self health management plan as evidenced by voiced understanding and teach back  ?take all medications exactly as prescribed and will call provider for medication related questions as evidenced by dispense report and pt verbalization  ?attend all scheduled medical appointments: Neurology 01/23/22, Dr. Elease Hashimoto 01/24/22 as evidenced by medical records ?demonstrate Improved adherence to prescribed treatment plan for HTN, Osteoporosis, and RA, chronic pain  as evidenced by readings within limits, voiced adherence to plan of care ?continue  to work with RN Care Manager to address care management and care coordination needs related to  HTN, Osteoporosis, and RA, chronic pain  as evidenced by adherence to CM Team Scheduled appointments through collaboration with RN Care manager, provider, and care team.  ? ?Interventions: ?1:1 collaboration with primary care provider regarding development and update of comprehensive plan of care as evidenced by provider attestation and co-signature ?Inter-disciplinary care team collaboration (see longitudinal plan of care) ?Evaluation of current treatment plan related to  self management and patient's adherence to plan as established by provider ? ? ?Falls Interventions:  (Status:  New goal.) Long  Term Goal ?Provided written and verbal education re: potential causes of falls and Fall prevention strategies ?Reviewed medications and discussed potential side effects of medications such as dizziness and fre

## 2022-01-16 DIAGNOSIS — M0589 Other rheumatoid arthritis with rheumatoid factor of multiple sites: Secondary | ICD-10-CM | POA: Diagnosis not present

## 2022-01-16 DIAGNOSIS — H04123 Dry eye syndrome of bilateral lacrimal glands: Secondary | ICD-10-CM | POA: Diagnosis not present

## 2022-01-16 DIAGNOSIS — H35372 Puckering of macula, left eye: Secondary | ICD-10-CM | POA: Diagnosis not present

## 2022-01-16 DIAGNOSIS — H4720 Unspecified optic atrophy: Secondary | ICD-10-CM | POA: Diagnosis not present

## 2022-01-22 DIAGNOSIS — M546 Pain in thoracic spine: Secondary | ICD-10-CM | POA: Diagnosis not present

## 2022-01-22 DIAGNOSIS — M5459 Other low back pain: Secondary | ICD-10-CM | POA: Diagnosis not present

## 2022-01-22 DIAGNOSIS — M8580 Other specified disorders of bone density and structure, unspecified site: Secondary | ICD-10-CM | POA: Diagnosis not present

## 2022-01-22 DIAGNOSIS — Z981 Arthrodesis status: Secondary | ICD-10-CM | POA: Diagnosis not present

## 2022-01-22 DIAGNOSIS — M542 Cervicalgia: Secondary | ICD-10-CM | POA: Diagnosis not present

## 2022-01-22 DIAGNOSIS — Z9889 Other specified postprocedural states: Secondary | ICD-10-CM | POA: Diagnosis not present

## 2022-01-23 ENCOUNTER — Encounter: Payer: Self-pay | Admitting: Neurology

## 2022-01-23 ENCOUNTER — Ambulatory Visit (INDEPENDENT_AMBULATORY_CARE_PROVIDER_SITE_OTHER): Payer: Medicare Other | Admitting: Neurology

## 2022-01-23 VITALS — BP 116/74 | HR 83 | Ht 61.0 in | Wt 123.0 lb

## 2022-01-23 DIAGNOSIS — R269 Unspecified abnormalities of gait and mobility: Secondary | ICD-10-CM | POA: Diagnosis not present

## 2022-01-23 DIAGNOSIS — M316 Other giant cell arteritis: Secondary | ICD-10-CM

## 2022-01-23 MED ORDER — PREDNISONE 1 MG PO TABS: ORAL_TABLET | ORAL | 3 refills | Status: DC

## 2022-01-23 NOTE — Progress Notes (Signed)
? ? ?Patient: [name] ?Date of Birth: 02-09-1941 ? ?Reason for Visit: Follow up for right-sided temporal arteritis ?History from: Patient, alone ?Primary Neurologist: Dr. Krista Blue  ? ?ASSESSMENT AND PLAN ?81 y.o. year old female  ? ?1.  Temporal arteritis, right ?2.  Gait disturbance ?3.  Peripheral neuropathy ? ?-Well-appearing today, seems to be doing well ?-Will again try to slightly reduce the dose of prednisone by alternating daily dose from 3 mg to 4 mg every other day, watch for any worsening symptoms; if she cannot tolerate will remain off 4 mg daily; has been on some dose of prednisone since at least 2012 ?-She will return in 6 to 8 months for revisit, will see Dr.Yan to establish care  ? ?HISTORY OF PRESENT ILLNESS: ?Today 01/23/22 ?[name] here today for follow-up.  History of temporal arteritis with vision loss in the right eye. Has RA, on low-dose prednisone 4 mg daily.  History of several lumbosacral spine surgeries, has chronic left foot drop.  Had a left frontotemporal headache in March 2020 to develop.  Treated with prednisone 60 mg daily, but did not have resolution of headache.  MRI brain showed moderate level SVD, carotid Doppler study was unremarkable.  When saw Dr. [name] in October 2022, alternating prednisone dose daily 3 mg to 4 mg, she felt terrible, went back to 4 mg daily. Still has occasional left sided headache, but not nearly as bad. Had appendicitis in May 2022, went to rehab, didn't go back to her home until September. Is back to driving, living alone now. Gets infusion every 8 weeks with rheumatology. On prednisone since around 2012. Saw spine doctor yesterday, after a fall, hardware looked good.  ? ?HISTORY  ?07/03/2021 Dr. [name]: Ms. Adamski is an 81 year old right-handed white female with a history of temporal arteritis, rheumatoid arthritis, cervical myelopathy, and peripheral neuropathy.  The patient has a gait disturbance.  She uses a walker to get around, she has  not had any recent falls.  In May 2022, the patient had appendicitis and required a prolonged hospital stay and a stay at a rehab facility.  The patient has recovered from this.  She is back home, she is trying to stay active.  She is sleeping well at this point.  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  She is getting physical therapy ongoing.  She returns here for further evaluation.  She is no longer having headaches at this point. ?  ?REVIEW OF SYSTEMS: Out of a complete 14 system review of symptoms, the patient complains only of the following symptoms, and all other reviewed systems are negative. ? ?See HPI ? ?ALLERGIES: ?Allergies  ?Allergen Reactions  ? Hydromorphone Other (See Comments)  ?  Cognitive changes  ?"made me unconscious"  ? ? ?HOME MEDICATIONS: ?Outpatient Medications Prior to Visit  ?Medication Sig Dispense Refill  ? amLODipine (NORVASC) 5 MG tablet Take 1 tablet (5 mg total) by mouth daily. 90 tablet 3  ? Calcium Carb-Cholecalciferol (CALCIUM CARBONATE-VITAMIN D3 PO) Take 1 tablet by mouth daily    ? cholecalciferol (VITAMIN D) 1000 UNITS tablet Take 1,000 Units by mouth daily.    ? denosumab (PROLIA) 60 MG/ML SOLN injection Inject 60 mg into the skin every 6 (six) months.    ? diclofenac sodium (VOLTAREN) 1 % GEL APPLY 2 GRAMS TOPICALLY FOUR TIMES A DAY 400 g 6  ? ferrous sulfate 325 (65 FE) MG tablet Take 325 mg by mouth 2 (two) times daily.    ?  fluticasone (FLONASE) 50 MCG/ACT nasal spray Place 2 sprays into both nostrils daily. 16 g 6  ? furosemide (LASIX) 40 MG tablet TAKE 1 TABLET DAILY 90 tablet 3  ? gabapentin (NEURONTIN) 300 MG capsule TAKE 2 CAPSULES TWICE A DAY (KEEP APPOINTMENT SCHEDULED) 360 capsule 1  ? Golimumab (Erath ARIA IV) Inject into the vein. Takes infusion every 8 wks.    ? Multiple Vitamins-Minerals (MULTIVITAMIN,TX-MINERALS) tablet Take 1 tablet by mouth daily.    ? oxyCODONE (OXYCONTIN) 10 mg 12 hr tablet Take 1 tablet (10 mg total) by mouth every 12  (twelve) hours. 60 tablet 0  ? potassium chloride (KLOR-CON M) 10 MEQ tablet Take 1 tablet (10 mEq total) by mouth 2 (two) times daily. 180 tablet 1  ? oxyCODONE (OXYCONTIN) 10 mg 12 hr tablet Take 1 tablet (10 mg total) by mouth every 12 (twelve) hours. 60 tablet 0  ? oxyCODONE (OXYCONTIN) 10 mg 12 hr tablet Take 1 tablet (10 mg total) by mouth every 12 (twelve) hours. 60 tablet 0  ? Oxycodone HCl 10 MG TABS Take 1 tablet (10 mg total) by mouth every 6 (six) hours as needed. Take 1 tablet by mouth every 6 hours as needed for breakthrough pain 90 tablet 0  ? predniSONE (DELTASONE) 1 MG tablet TAKE 4 TABLETS DAILY WITH BREAKFAST 360 tablet 3  ? simvastatin (ZOCOR) 5 MG tablet TAKE 1 TABLET AT BEDTIME (Patient not taking: Reported on 01/15/2022) 90 tablet 1  ? ?No facility-administered medications prior to visit.  ? ? ?PAST MEDICAL HISTORY: ?Past Medical History:  ?Diagnosis Date  ? Allergic rhinitis   ? Anemia   ? Anxiety   ? Bronchiectasis   ? Carpal tunnel syndrome 07/13/2015  ? Bilateral  ? CVA (cerebral infarction) 10/2007  ? Right thalamic   ? Diverticulosis of colon   ? Gait disorder   ? GERD (gastroesophageal reflux disease)   ? History of breast cancer   ? HTN (hypertension)   ? Hyperlipidemia   ? Idiopathic pulmonary fibrosis   ? Left knee DJD   ? Migraine   ? Osteoporosis   ? Peripheral neuropathy   ? PMR (polymyalgia rheumatica) (HCC)   ? Polyneuropathy in other diseases classified elsewhere ([place]) 02/17/2013  ? Previous back surgery 10/09/12  ? Renal insufficiency   ? Rheumatoid arthritis ([place])   ? Seizure disorder ([place])   ? Spondylosis, cervical, with myelopathy 08/31/2015  ? C3-4 myelopathy  ? Temporal arteritis ([place])   ? Right eye blind, on steroids per [facility]/ Dr [name]  ? Type II or unspecified type diabetes mellitus without mention of complication, not stated as uncontrolled   ? 2nd to steriods  ? Vitamin D deficiency   ? ? ?PAST SURGICAL HISTORY: ?Past Surgical History:  ?Procedure Laterality Date  ?  APPENDECTOMY  01/2021  ? CARPAL TUNNEL RELEASE Right   ? CATARACT EXTRACTION    ? OS - Summer 2010  ? CERVICAL FUSION  09/24/2008  ? 4 rods and pins in place  ? CHOLECYSTECTOMY    ? COLONOSCOPY  12/15/2011  ? Procedure: COLONOSCOPY;  Surgeon: [name], MD;  Location: [facility];  Service: Endoscopy;  Laterality: N/A;  ? LUMBAR FUSION  04/24/2010  ? W/Mechanical fixation - [facility]  ? LUMBAR FUSION  09/25/2011  ? rods in hips (to stabilize).  ? MASTECTOMY    ? NECK SURGERY    ? rheumatoid nodule removal    ? SPINAL FUSION  07/25/2010  ? T10-L2 interbody  fusion / [facility]  ? TONSILLECTOMY    ? TOTAL KNEE ARTHROPLASTY    ? Left  ? ? ?FAMILY HISTORY: ?Family History  ?Problem Relation Age of Onset  ? Brain cancer Mother   ? Hypertension Mother   ? Arthritis Mother   ? Dementia Father   ? Hypertension Father   ? Arthritis Father   ? Breast cancer Sister   ? Lung cancer Sister   ? Lung disease Neg Hx   ? Rheumatologic disease Neg Hx   ? ? ?SOCIAL HISTORY: ?Social History  ? ?Socioeconomic History  ? Marital status: Widowed  ?  Spouse name: Not on file  ? Number of children: 1  ? Years of education: 12+ coll.  ? Highest education level: Not on file  ?Occupational History  ? Occupation: Land  ?  Employer: RETIRED  ?  Comment: retired  ?Tobacco Use  ? Smoking status: Never  ? Smokeless tobacco: Never  ? Tobacco comments:  ?  Father   ?Vaping Use  ? Vaping Use: Never used  ?Substance and Sexual Activity  ? Alcohol use: No  ?  Alcohol/week: 0.0 standard drinks  ?  Comment: heavy drinker until 1995 - sobriety with AA  ? Drug use: No  ? Sexual activity: Not Currently  ?Other Topics Concern  ? Not on file  ?Social History Narrative  ? 07/03/21 friend [name] living with her  ? Right handed  ? Drinks 6-8 cups caffeine daily  ?   ? ?Social Determinants of Health  ? ?Financial Resource Strain: Low Risk   ? Difficulty of Paying Living Expenses: Not hard at all   ?Food Insecurity: No Food Insecurity  ? Worried About Charity fundraiser in the Last Year: Never true  ? Ran Out of Food in the Last Year: Never true  ?Transportation Needs: No Transportation Needs  ? Lack of T

## 2022-01-23 NOTE — Progress Notes (Signed)
Chart reviewed, agree above plan ?

## 2022-01-23 NOTE — Patient Instructions (Signed)
Try to reduce the dose of prednisone by alternating 3 mg daily with 4 mg, watch for worsening symptoms ?Come back in 6-8 months with Dr. Krista Blue  ?

## 2022-01-24 ENCOUNTER — Encounter: Payer: Self-pay | Admitting: Family Medicine

## 2022-01-24 ENCOUNTER — Ambulatory Visit (INDEPENDENT_AMBULATORY_CARE_PROVIDER_SITE_OTHER): Payer: Medicare Other | Admitting: Family Medicine

## 2022-01-24 VITALS — BP 116/70 | HR 78 | Temp 97.9°F | Ht 61.0 in | Wt 122.2 lb

## 2022-01-24 DIAGNOSIS — I10 Essential (primary) hypertension: Secondary | ICD-10-CM

## 2022-01-24 DIAGNOSIS — G8929 Other chronic pain: Secondary | ICD-10-CM

## 2022-01-24 DIAGNOSIS — R52 Pain, unspecified: Secondary | ICD-10-CM

## 2022-01-24 DIAGNOSIS — M545 Low back pain, unspecified: Secondary | ICD-10-CM

## 2022-01-24 NOTE — Progress Notes (Signed)
? ?Established Patient Office Visit ? ?Subjective   ?Patient ID: [name], female    DOB: 06/23/1941  Age: 81 y.o. MRN: [id number] ? ?Chief Complaint  ?Patient presents with  ? Follow-up  ? ? ?HPI ? ? ?[name] is here for chronic pain management follow-up and medical follow-up.  She has history of temporal arteritis and is still followed by neurology.  They are in the process of trying to taper her prednisone further.  She just went from 4 mg to 3 mg.  So far, no breakthrough headaches or other symptoms.  Her other medical problems include history of hypertension, pulmonary fibrosis, remote history of breast cancer, severe arthritis involving her spine.  She has chronic neck and back pain.  She is still followed by neurosurgery.  Multiple prior surgeries.  Current pain mostly mid thoracic back. ? ?She remains on gabapentin twice daily and takes OxyContin 10 mg twice daily with occasional 10 mg immediate release for breakthrough pain.  Recently switched to [facility] which will save her considerable amount of money.  No constipation issues. ? ?She has hypertension currently treated with amlodipine 5 mg daily.  Compliant with therapy.  No dizziness.  No headaches.  No significant peripheral edema.  Blood pressures have been stable. ? ?Past Medical History:  ?Diagnosis Date  ? Allergic rhinitis   ? Anemia   ? Anxiety   ? Bronchiectasis   ? Carpal tunnel syndrome 07/13/2015  ? Bilateral  ? CVA (cerebral infarction) 10/2007  ? Right thalamic   ? Diverticulosis of colon   ? Gait disorder   ? GERD (gastroesophageal reflux disease)   ? History of breast cancer   ? HTN (hypertension)   ? Hyperlipidemia   ? Idiopathic pulmonary fibrosis   ? Left knee DJD   ? Migraine   ? Osteoporosis   ? Peripheral neuropathy   ? PMR (polymyalgia rheumatica) (HCC)   ? Polyneuropathy in other diseases classified elsewhere ([place]) 02/17/2013  ? Previous back surgery 10/09/12  ? Renal insufficiency   ? Rheumatoid arthritis ([place])   ?  Seizure disorder ([place])   ? Spondylosis, cervical, with myelopathy 08/31/2015  ? C3-4 myelopathy  ? Temporal arteritis ([place])   ? Right eye blind, on steroids per [facility]/ Dr [name]  ? Type II or unspecified type diabetes mellitus without mention of complication, not stated as uncontrolled   ? 2nd to steriods  ? Vitamin D deficiency   ? ?Past Surgical History:  ?Procedure Laterality Date  ? APPENDECTOMY  01/2021  ? CARPAL TUNNEL RELEASE Right   ? CATARACT EXTRACTION    ? OS - Summer 2010  ? CERVICAL FUSION  09/24/2008  ? 4 rods and pins in place  ? CHOLECYSTECTOMY    ? COLONOSCOPY  12/15/2011  ? Procedure: COLONOSCOPY;  Surgeon: [name], MD;  Location: [facility];  Service: Endoscopy;  Laterality: N/A;  ? LUMBAR FUSION  04/24/2010  ? W/Mechanical fixation - [facility]  ? LUMBAR FUSION  09/25/2011  ? rods in hips (to stabilize).  ? MASTECTOMY    ? NECK SURGERY    ? rheumatoid nodule removal    ? SPINAL FUSION  07/25/2010  ? T10-L2 interbody fusion / [place]    ? TOTAL KNEE ARTHROPLASTY    ? Left  ? ? reports that she has never smoked. She has never used smokeless tobacco. She reports that she does not drink alcohol and does not use drugs. ?family history includes  Arthritis in her father and mother; Brain cancer in her mother; Breast cancer in her sister; Dementia in her father; Hypertension in her father and mother; Lung cancer in her sister. ?Allergies  ?Allergen Reactions  ? Hydromorphone Other (See Comments)  ?  Cognitive changes  ?"made me unconscious"  ? ? ?Review of Systems  ?Constitutional:  Negative for chills and fever.  ?Respiratory:  Negative for shortness of breath and wheezing.   ?Cardiovascular:  Negative for chest pain.  ?Genitourinary:  Negative for dysuria.  ?Musculoskeletal:  Positive for back pain and neck pain.  ?Neurological:  Negative for dizziness and headaches.  ? ?  ?Objective:  ?  ? ?BP 116/70 (BP Location: Left Arm, Patient Position: Sitting, Cuff  Size: Normal)   Pulse 78   Temp 97.9 ?F (36.6 ?C) (Oral)   Ht '5\' 1"'$  (1.549 m)   Wt 122 lb 3.2 oz (55.4 kg)   SpO2 95%   BMI 23.09 kg/m?  ? ? ?Physical Exam ?Vitals reviewed.  ?Cardiovascular:  ?   Rate and Rhythm: Normal rate and regular rhythm.  ?Pulmonary:  ?   Effort: Pulmonary effort is normal.  ?   Breath sounds: Normal breath sounds.  ?Musculoskeletal:  ?   Right lower leg: No edema.  ?   Left lower leg: No edema.  ?Neurological:  ?   Mental Status: She is alert.  ? ? ? ?No results found for any visits on 01/24/22. ? ? ? ?The ASCVD Risk score (Arnett DK, et al., 2019) failed to calculate for the following reasons: ?  The 2019 ASCVD risk score is only valid for ages 63 to 83 ? ?  ?Assessment & Plan:  ? ?#1 chronic pain management related to chronic severe osteoarthritis throughout her cervical to lumbar spine.  Currently stable on regimen of OxyContin 10 mg twice daily ? ?-PDMP reviewed with no concerns ?-Check urine drug screen ?-Previous controlled medicine contract has been signed ?-She is not due at this time for pain medication.  We will need to send a new prescription in about 3 weeks to [facility] ? ?#2 hypertension stable and at goal.  Continue amlodipine 5 mg daily ? ? ?Return in about 3 months (around 04/26/2022).  ? ? ?Carolann Littler, MD ? ?

## 2022-01-25 LAB — DRUG MONITOR, PANEL 1, SCREEN, URINE
Amphetamines: NEGATIVE ng/mL (ref <500)
Barbiturates: NEGATIVE ng/mL (ref <300)
Benzodiazepines: NEGATIVE ng/mL (ref <100)
Cocaine Metabolite: NEGATIVE ng/mL (ref <150)
Creatinine: 105.8 mg/dL (ref >=20.0)
Marijuana Metabolite: NEGATIVE ng/mL (ref <20)
Methadone Metabolite: NEGATIVE ng/mL (ref <100)
Opiates: POSITIVE ng/mL — AB (ref <100)
Oxidant: NEGATIVE ug/mL (ref <200)
Oxycodone: POSITIVE ng/mL — AB (ref <100)
Phencyclidine: NEGATIVE ng/mL (ref <25)
pH: 6.7 (ref 4.5–9.0)

## 2022-01-25 LAB — DM TEMPLATE

## 2022-01-26 DIAGNOSIS — M810 Age-related osteoporosis without current pathological fracture: Secondary | ICD-10-CM | POA: Diagnosis not present

## 2022-01-29 DIAGNOSIS — R262 Difficulty in walking, not elsewhere classified: Secondary | ICD-10-CM | POA: Diagnosis not present

## 2022-01-29 DIAGNOSIS — Z981 Arthrodesis status: Secondary | ICD-10-CM | POA: Diagnosis not present

## 2022-01-29 DIAGNOSIS — M545 Low back pain, unspecified: Secondary | ICD-10-CM | POA: Diagnosis not present

## 2022-01-29 DIAGNOSIS — M21372 Foot drop, left foot: Secondary | ICD-10-CM | POA: Diagnosis not present

## 2022-01-29 DIAGNOSIS — M542 Cervicalgia: Secondary | ICD-10-CM | POA: Diagnosis not present

## 2022-01-29 DIAGNOSIS — R29898 Other symptoms and signs involving the musculoskeletal system: Secondary | ICD-10-CM | POA: Diagnosis not present

## 2022-02-07 DIAGNOSIS — M21372 Foot drop, left foot: Secondary | ICD-10-CM | POA: Diagnosis not present

## 2022-02-07 DIAGNOSIS — M545 Low back pain, unspecified: Secondary | ICD-10-CM | POA: Diagnosis not present

## 2022-02-07 DIAGNOSIS — R29898 Other symptoms and signs involving the musculoskeletal system: Secondary | ICD-10-CM | POA: Diagnosis not present

## 2022-02-07 DIAGNOSIS — M542 Cervicalgia: Secondary | ICD-10-CM | POA: Diagnosis not present

## 2022-02-07 DIAGNOSIS — R262 Difficulty in walking, not elsewhere classified: Secondary | ICD-10-CM | POA: Diagnosis not present

## 2022-02-20 ENCOUNTER — Telehealth: Payer: Self-pay | Admitting: Family Medicine

## 2022-02-20 MED ORDER — OXYCODONE HCL ER 10 MG PO T12A: 10.0000 mg | EXTENDED_RELEASE_TABLET | Freq: Two times a day (BID) | ORAL | 0 refills | Status: DC

## 2022-02-20 NOTE — Telephone Encounter (Signed)
Requesting refill of oxyCODONE (OXYCONTIN) 10 mg 12 hr tablet   [place], [place] Phone:  [phone]  Fax:  [phone]

## 2022-02-21 DIAGNOSIS — M21372 Foot drop, left foot: Secondary | ICD-10-CM | POA: Diagnosis not present

## 2022-02-21 DIAGNOSIS — M545 Low back pain, unspecified: Secondary | ICD-10-CM | POA: Diagnosis not present

## 2022-02-21 DIAGNOSIS — R29898 Other symptoms and signs involving the musculoskeletal system: Secondary | ICD-10-CM | POA: Diagnosis not present

## 2022-02-21 DIAGNOSIS — M542 Cervicalgia: Secondary | ICD-10-CM | POA: Diagnosis not present

## 2022-02-21 DIAGNOSIS — R262 Difficulty in walking, not elsewhere classified: Secondary | ICD-10-CM | POA: Diagnosis not present

## 2022-02-26 DIAGNOSIS — M545 Low back pain, unspecified: Secondary | ICD-10-CM | POA: Diagnosis not present

## 2022-02-26 DIAGNOSIS — R262 Difficulty in walking, not elsewhere classified: Secondary | ICD-10-CM | POA: Diagnosis not present

## 2022-02-26 DIAGNOSIS — M21372 Foot drop, left foot: Secondary | ICD-10-CM | POA: Diagnosis not present

## 2022-02-26 DIAGNOSIS — M542 Cervicalgia: Secondary | ICD-10-CM | POA: Diagnosis not present

## 2022-03-01 ENCOUNTER — Telehealth: Payer: Medicare Other

## 2022-03-01 ENCOUNTER — Telehealth: Payer: Self-pay | Admitting: Family Medicine

## 2022-03-01 ENCOUNTER — Ambulatory Visit: Payer: Medicare Other

## 2022-03-01 DIAGNOSIS — I10 Essential (primary) hypertension: Secondary | ICD-10-CM

## 2022-03-01 DIAGNOSIS — M545 Other chronic pain: Secondary | ICD-10-CM

## 2022-03-01 DIAGNOSIS — M069 Rheumatoid arthritis, unspecified: Secondary | ICD-10-CM

## 2022-03-01 NOTE — Telephone Encounter (Signed)
Last refill per controlled substance database: 02/26/21 Last OV: 01/24/22 Next OV: 04/25/22  LVM informing pt that medication she is requesting cannot be filled at this time due to it being filled 3 days ago; it can be requested for refill again in 1 month; may call office if any questions.

## 2022-03-01 NOTE — Telephone Encounter (Signed)
noted 

## 2022-03-01 NOTE — Telephone Encounter (Signed)
Pt requesting a prescription of a few pills to get her thru until her prescription [facility] arrives.

## 2022-03-01 NOTE — Chronic Care Management (AMB) (Signed)
Care Management    RN Visit Note  03/01/2022 Name: [name] MRN: [id number] DOB: [birth year]  Subjective: [name] is a 81 y.o. year old female who is a primary care patient of Burchette, Alinda Sierras, MD. The care management team was consulted for assistance with disease management and care coordination needs.    Engaged with patient by telephone for follow up visit in response to provider referral for case management and/or care coordination services.   Consent to Services:   Ms. Barich was given information about Care Management services today including:  Care Management services includes personalized support from designated clinical staff supervised by her physician, including individualized plan of care and coordination with other care providers 24/7 contact phone numbers for assistance for urgent and routine care needs. The patient may stop case management services at any time by phone call to the office staff.  Patient agreed to services and consent obtained.   Assessment: Review of patient past medical history, allergies, medications, health status, including review of consultants reports, laboratory and other test data, was performed as part of comprehensive evaluation and provision of chronic care management services.   SDOH (Social Determinants of Health) assessments and interventions performed:    Care Plan  Allergies  Allergen Reactions   Hydromorphone Other (See Comments)    Cognitive changes  "made me unconscious"    Outpatient Encounter Medications as of 03/01/2022  Medication Sig   amLODipine (NORVASC) 5 MG tablet Take 1 tablet (5 mg total) by mouth daily.   Calcium Carb-Cholecalciferol (CALCIUM CARBONATE-VITAMIN D3 PO) Take 1 tablet by mouth daily   cholecalciferol (VITAMIN D) 1000 UNITS tablet Take 1,000 Units by mouth daily.   denosumab (PROLIA) 60 MG/ML SOLN injection Inject 60 mg into the skin every 6 (six) months.   diclofenac sodium (VOLTAREN) 1 %  GEL APPLY 2 GRAMS TOPICALLY FOUR TIMES A DAY   ferrous sulfate 325 (65 FE) MG tablet Take 325 mg by mouth 2 (two) times daily.   fluticasone (FLONASE) 50 MCG/ACT nasal spray Place 2 sprays into both nostrils daily.   furosemide (LASIX) 40 MG tablet TAKE 1 TABLET DAILY   gabapentin (NEURONTIN) 300 MG capsule TAKE 2 CAPSULES TWICE A DAY (KEEP APPOINTMENT SCHEDULED)   Golimumab (SIMPONI ARIA IV) Inject into the vein. Takes infusion every 8 wks.   Multiple Vitamins-Minerals (MULTIVITAMIN,TX-MINERALS) tablet Take 1 tablet by mouth daily.   oxyCODONE (OXYCONTIN) 10 mg 12 hr tablet Take 1 tablet (10 mg total) by mouth every 12 (twelve) hours.   potassium chloride (KLOR-CON M) 10 MEQ tablet Take 1 tablet (10 mEq total) by mouth 2 (two) times daily.   predniSONE (DELTASONE) 1 MG tablet TAKE 4 TABLETS DAILY WITH BREAKFAST   [DISCONTINUED] potassium chloride (K-DUR) 10 MEQ tablet Take 1 tablet (10 mEq total) by mouth 2 (two) times daily.   No facility-administered encounter medications on file as of 03/01/2022.    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   Pain management 10/30/2017   Rheumatoid arthritis ([place]) 04/25/2017   S/P cervical spinal fusion 09/05/2015   Spondylosis, cervical, with myelopathy 08/31/2015   Carpal tunnel syndrome 07/13/2015   Torn ear lobe 06/07/2015   Obesity (BMI 30-39.9) 04/06/2014   Shortness of breath 07/14/2013   Bone  cyst 07/14/2013   Polyneuropathy in other diseases classified elsewhere ([place]) 02/17/2013   Chronic back pain 12/09/2012   Peripheral edema 09/25/2012   Anemia due to chronic illness 09/01/2012   DDD (degenerative disc disease), lumbosacral 08/12/2012   Cervicalgia 07/11/2012   Disturbance of skin sensation 07/11/2012   Temporal arteritis  ([place]) 04/01/2012   Lower GI bleed 12/24/2011   Rectal bleed 12/13/2011   Arthrodesis status 09/04/2011   Chronic renal insufficiency, stage II (mild) 04/04/2011   LUMBAR RADICULOPATHY, LEFT 04/18/2009   Essential hypertension 12/30/2007   SEIZURE DISORDER 12/30/2007   CEREBROVASCULAR ACCIDENT, HX OF 12/30/2007   Hyperlipidemia 10/08/2007   GERD 10/08/2007   DIVERTICULOSIS, COLON 10/08/2007   BRONCHIECTASIS 06/26/2007   PULMONARY FIBROSIS 05/17/2007   Osteoporosis 05/17/2007   BREAST CANCER, HX OF 05/17/2007   MASTECTOMY, LEFT, HX OF 05/17/2007    Conditions to be addressed/monitored: HTN, Osteoporosis, and RA, chronic pain  Care Plan : RN Care Manager Plan of Care  Updates made by Dimitri Ped, RN since 03/01/2022 12:00 AM     Problem: Disease Management and Care Coordination Needs(HTN, RA, chronic pain, osteoporosis)   Priority: High     Long-Range Goal: Establish Plan of Care for Care Coordination and Disease Management Needs(HTN, RA, chronic pain, osteoporosis)   Start Date: 01/15/2022  Expected End Date: 01/15/2023  Priority: High  Note:   Current Barriers:  Knowledge Deficits related to plan of care for management of HTN, Osteoporosis, and RA, chronic pain  Chronic Disease Management support and education needs related to HTN, Osteoporosis, and RA, chronic pain    States she now is staying alone and is independent taking care of herself.  States her back pain is controlled with her pain medications. States she is waiting for her delivery of her pain medications and she will be out if it does not come today.  States she has called [facility] and it has been mailed out. States from now on she will need to get her refills sent in 2 week in advance. Denies any recent falls. States she is going to PT for strengthening and she feels her balance is getting better. States she is still thinking about getting an emergency call alarm but has not called to get yet.  States she  does use her walker or Rolator all of the time.  States she does not check her B/P regularly as it has been good since she has lost about 100 lbs in the last few yrs. States she tries to follow a low sodium diet  RNCM Clinical Goal(s):  Patient will verbalize understanding of plan for management of HTN, Osteoporosis, and RA, chronic pain  as evidenced by voiced adherence to plan of care verbalize basic understanding of  HTN, Osteoporosis, and RA, chronic pain  disease process and self health management plan as evidenced by voiced understanding and teach back  take all medications exactly as prescribed and will call provider for medication related questions as evidenced by dispense report and pt verbalization  attend all scheduled medical appointments: Neurology 08/07/22, Dr. Elease Hashimoto 04/25/22 as evidenced by medical records demonstrate Improved adherence to prescribed treatment plan for HTN, Osteoporosis, and RA, chronic pain  as evidenced by readings within limits, voiced adherence to plan of care continue to work with RN Care Manager to address care management and care coordination needs related to  HTN, Osteoporosis, and RA, chronic pain  as evidenced by adherence to CM Team Scheduled appointments through collaboration with RN Care  manager, provider, and care team.   Interventions: 1:1 collaboration with primary care provider regarding development and update of comprehensive plan of care as evidenced by provider attestation and co-signature Inter-disciplinary care team collaboration (see longitudinal plan of care) Evaluation of current treatment plan related to  self management and patient's adherence to plan as established by provider   Falls Interventions:  (Status:  Goal on track:  Yes.) Long Term Goal Provided written and verbal education re: potential causes of falls and Fall prevention strategies Reviewed medications and discussed potential side effects of medications such as dizziness and  frequent urination Advised patient of importance of notifying provider of falls Assessed for falls since last encounter Provided patient information for fall alert systems Reviewed importance of having a fall alert system since she lives alone   Hypertension Interventions:  (Status:  Goal on track:  Yes.) Long Term Goal Last practice recorded BP readings:  BP Readings from Last 3 Encounters:  01/24/22 116/70  01/23/22 116/74  10/27/21 136/70  Most recent eGFR/CrCl:  Lab Results  Component Value Date   EGFR 41 (L) 12/29/2020    No components found for: CRCL  Evaluation of current treatment plan related to hypertension self management and patient's adherence to plan as established by provider Provided education to patient re: stroke prevention, s/s of heart attack and stroke Reviewed medications with patient and discussed importance of compliance Advised patient, providing education and rationale, to monitor blood pressure daily and record, calling PCP for findings outside established parameters Provided education on prescribed diet low sodium Discussed complications of poorly controlled blood pressure such as heart disease, stroke, circulatory complications, vision complications, kidney impairment, sexual dysfunction  Pain/Rheumatoid Arthritis Interventions:  (Status:  Goal on track:  Yes.) Long Term Goal Pain assessment performed Medications reviewed Reviewed provider established plan for pain management Discussed importance of adherence to all scheduled medical appointments Counseled on the importance of reporting any/all new or changed pain symptoms or management strategies to pain management provider Advised patient to report to care team affect of pain on daily activities Discussed use of relaxation techniques and/or diversional activities to assist with pain reduction (distraction, imagery, relaxation, massage, acupressure, TENS, heat, and cold application Reviewed with patient  prescribed pharmacological and nonpharmacological pain relief strategies Reviewed to call provider if her pain medication is not delivered today to try to get partial  fill sent to a local pharmacy  Patient Goals/Self-Care Activities: Take all medications as prescribed Attend all scheduled provider appointments Call pharmacy for medication refills 3-7 days in advance of running out of medications Perform all self care activities independently  Call provider office for new concerns or questions  Consider getting a fall alert system check blood pressure weekly choose a place to take my blood pressure (home, clinic or office, retail store) write blood pressure results in a log or diary call doctor for signs and symptoms of high blood pressure begin an exercise program eat more whole grains, fruits and vegetables, lean meats and healthy fats limit salt intake to 2314m/day  Follow Up Plan:  Telephone follow up appointment with care management team member scheduled for:  05/17/22 The patient has been provided with contact information for the care management team and has been advised to call with any health related questions or concerns.       Plan: Telephone follow up appointment with care management team member scheduled for:  05/17/22 The patient has been provided with contact information for the care management team and has  been advised to call with any health related questions or concerns.  Peter Garter RN, Jackquline Denmark, CDE Care Management Coordinator [facility] [phone]

## 2022-03-01 NOTE — Telephone Encounter (Signed)
Pt call and stated she is out of oxyCODONE (OXYCONTIN) 10 mg 12 hr tablet and want a rx for 14 days sent to  [facility] [address] Phone:  [phone]  Fax:  [phone]

## 2022-03-01 NOTE — Telephone Encounter (Signed)
Pt called back and her medication has arrived.

## 2022-03-01 NOTE — Patient Instructions (Signed)
Visit Information  Thank you for taking time to visit with me today. Please don't hesitate to contact me if I can be of assistance to you before our next scheduled telephone appointment.  Following are the goals we discussed today:  Take all medications as prescribed Attend all scheduled provider appointments Call pharmacy for medication refills 3-7 days in advance of running out of medications Perform all self care activities independently  Call provider office for new concerns or questions  Consider getting a fall alert system check blood pressure weekly choose a place to take my blood pressure (home, clinic or office, retail store) write blood pressure results in a log or diary call doctor for signs and symptoms of high blood pressure begin an exercise program eat more whole grains, fruits and vegetables, lean meats and healthy fats limit salt intake to '2300mg'$ /day  Our next appointment is by telephone on 05/17/22 at 11:30 PM  Please call the care guide team at [phone] if you need to cancel or reschedule your appointment.   If you are experiencing a Mental Health or [place] or need someone to talk to, please call the Suicide and Crisis Lifeline: 988 call the [place] National Suicide Prevention Lifeline: (612) 428-3800 or TTY: 304-355-6273 TTY (517)448-7388) to talk to a trained counselor call 1-800-273-TALK (toll free, 24 hour hotline) go to [facility] [address], [place] [phone]) call 911   Patient verbalizes understanding of instructions and care plan provided today and agrees to view in [place]. Active MyChart status and patient understanding of how to access instructions and care plan via MyChart confirmed with patient.     Telephone follow up appointment with care management team member scheduled for:05/17/22 at 11:30 AM  [place], [facility], CDE Care Management Coordinator [facility] [phone]

## 2022-03-05 DIAGNOSIS — R29898 Other symptoms and signs involving the musculoskeletal system: Secondary | ICD-10-CM | POA: Diagnosis not present

## 2022-03-05 DIAGNOSIS — R262 Difficulty in walking, not elsewhere classified: Secondary | ICD-10-CM | POA: Diagnosis not present

## 2022-03-05 DIAGNOSIS — M545 Low back pain, unspecified: Secondary | ICD-10-CM | POA: Diagnosis not present

## 2022-03-05 DIAGNOSIS — Z981 Arthrodesis status: Secondary | ICD-10-CM | POA: Diagnosis not present

## 2022-03-05 DIAGNOSIS — M21372 Foot drop, left foot: Secondary | ICD-10-CM | POA: Diagnosis not present

## 2022-03-05 DIAGNOSIS — M542 Cervicalgia: Secondary | ICD-10-CM | POA: Diagnosis not present

## 2022-03-07 DIAGNOSIS — M545 Low back pain, unspecified: Secondary | ICD-10-CM | POA: Diagnosis not present

## 2022-03-07 DIAGNOSIS — R29898 Other symptoms and signs involving the musculoskeletal system: Secondary | ICD-10-CM | POA: Diagnosis not present

## 2022-03-07 DIAGNOSIS — R262 Difficulty in walking, not elsewhere classified: Secondary | ICD-10-CM | POA: Diagnosis not present

## 2022-03-07 DIAGNOSIS — M542 Cervicalgia: Secondary | ICD-10-CM | POA: Diagnosis not present

## 2022-03-07 DIAGNOSIS — M21372 Foot drop, left foot: Secondary | ICD-10-CM | POA: Diagnosis not present

## 2022-03-07 DIAGNOSIS — Z981 Arthrodesis status: Secondary | ICD-10-CM | POA: Diagnosis not present

## 2022-03-13 DIAGNOSIS — M0589 Other rheumatoid arthritis with rheumatoid factor of multiple sites: Secondary | ICD-10-CM | POA: Diagnosis not present

## 2022-03-15 ENCOUNTER — Telehealth: Payer: Self-pay | Admitting: Family Medicine

## 2022-03-15 MED ORDER — OXYCODONE HCL ER 10 MG PO T12A: 10.0000 mg | EXTENDED_RELEASE_TABLET | Freq: Two times a day (BID) | ORAL | 0 refills | Status: DC

## 2022-03-15 NOTE — Telephone Encounter (Signed)
I sent in two month supply.   We need to make sure we have her on every 3 month cycle for office follow up.

## 2022-03-15 NOTE — Telephone Encounter (Signed)
Pt requesting refill oxyCODONE (OXYCONTIN) 10 mg 12 hr tablet   [place], [place] Phone:  [phone]  Fax:  [phone]

## 2022-03-15 NOTE — Telephone Encounter (Signed)
Pt informed that rx was sent. And she stated she would follow up every 3 months

## 2022-03-27 ENCOUNTER — Other Ambulatory Visit: Payer: Self-pay | Admitting: Family Medicine

## 2022-03-30 ENCOUNTER — Ambulatory Visit: Payer: Self-pay

## 2022-03-30 DIAGNOSIS — I10 Essential (primary) hypertension: Secondary | ICD-10-CM

## 2022-03-30 NOTE — Chronic Care Management (AMB) (Signed)
Care Management    RN Visit Note  03/30/2022 Name: [name] MRN: [id number] DOB: Sep 04, 1941  Subjective: [name] is a 81 y.o. year old female who is a primary care patient of Burchette, Alinda Sierras, MD. The care management team was consulted for assistance with disease management and care coordination needs.    Engaged with patient by telephone for  Case closed goals met  in response to provider referral for case management and/or care coordination services.   Consent to Services:   [name] was given information about Care Management services today including:  Care Management services includes personalized support from designated clinical staff supervised by her physician, including individualized plan of care and coordination with other care providers 24/7 contact phone numbers for assistance for urgent and routine care needs. The patient may stop case management services at any time by phone call to the office staff.  Patient agreed to services and consent obtained.   Assessment: Review of patient past medical history, allergies, medications, health status, including review of consultants reports, laboratory and other test data, was performed as part of comprehensive evaluation and provision of chronic care management services.   SDOH (Social Determinants of Health) assessments and interventions performed:    Care Plan  Allergies  Allergen Reactions   Hydromorphone Other (See Comments)    Cognitive changes  "made me unconscious"    Outpatient Encounter Medications as of 03/30/2022  Medication Sig   amLODipine (NORVASC) 5 MG tablet Take 1 tablet (5 mg total) by mouth daily.   Calcium Carb-Cholecalciferol (CALCIUM CARBONATE-VITAMIN D3 PO) Take 1 tablet by mouth daily   cholecalciferol (VITAMIN D) 1000 UNITS tablet Take 1,000 Units by mouth daily.   denosumab (PROLIA) 60 MG/ML SOLN injection Inject 60 mg into the skin every 6 (six) months.   diclofenac sodium  (VOLTAREN) 1 % GEL APPLY 2 GRAMS TOPICALLY FOUR TIMES A DAY   ferrous sulfate 325 (65 FE) MG tablet Take 325 mg by mouth 2 (two) times daily.   fluticasone (FLONASE) 50 MCG/ACT nasal spray Place 2 sprays into both nostrils daily.   furosemide (LASIX) 40 MG tablet TAKE 1 TABLET DAILY   gabapentin (NEURONTIN) 300 MG capsule TAKE 2 CAPSULES TWICE A DAY (KEEP APPOINTMENT SCHEDULED)   Golimumab (SIMPONI ARIA IV) Inject into the vein. Takes infusion every 8 wks.   Multiple Vitamins-Minerals (MULTIVITAMIN,TX-MINERALS) tablet Take 1 tablet by mouth daily.   oxyCODONE (OXYCONTIN) 10 mg 12 hr tablet Take 1 tablet (10 mg total) by mouth every 12 (twelve) hours.   oxyCODONE (OXYCONTIN) 10 mg 12 hr tablet Take 1 tablet (10 mg total) by mouth every 12 (twelve) hours.   potassium chloride (KLOR-CON M) 10 MEQ tablet TAKE 1 TABLET TWICE A DAY   predniSONE (DELTASONE) 1 MG tablet TAKE 4 TABLETS DAILY WITH BREAKFAST   [DISCONTINUED] potassium chloride (K-DUR) 10 MEQ tablet Take 1 tablet (10 mEq total) by mouth 2 (two) times daily.   No facility-administered encounter medications on file as of 03/30/2022.    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[place]) 08/25/2019   Pain management 10/30/2017   Rheumatoid arthritis ([place]) 04/25/2017   S/P cervical spinal fusion 09/05/2015   Spondylosis, cervical, with myelopathy 08/31/2015   Carpal tunnel syndrome 07/13/2015   Torn  ear lobe 06/07/2015   Obesity (BMI 30-39.9) 04/06/2014   Shortness of breath 07/14/2013   Bone cyst 07/14/2013   Polyneuropathy in other diseases classified elsewhere ([place]) 02/17/2013   Chronic back pain 12/09/2012   Peripheral edema 09/25/2012   Anemia due to chronic illness 09/01/2012   DDD (degenerative disc disease), lumbosacral 08/12/2012    Cervicalgia 07/11/2012   Disturbance of skin sensation 07/11/2012   Temporal arteritis ([place]) 04/01/2012   Lower GI bleed 12/24/2011   Rectal bleed 12/13/2011   Arthrodesis status 09/04/2011   Chronic renal insufficiency, stage II (mild) 04/04/2011   LUMBAR RADICULOPATHY, LEFT 04/18/2009   Essential hypertension 12/30/2007   SEIZURE DISORDER 12/30/2007   CEREBROVASCULAR ACCIDENT, HX OF 12/30/2007   Hyperlipidemia 10/08/2007   GERD 10/08/2007   DIVERTICULOSIS, COLON 10/08/2007   BRONCHIECTASIS 06/26/2007   PULMONARY FIBROSIS 05/17/2007   Osteoporosis 05/17/2007   BREAST CANCER, HX OF 05/17/2007   MASTECTOMY, LEFT, HX OF 05/17/2007    Conditions to be addressed/monitored: HTN  Care Plan : RN Care Manager Plan of Care  Updates made by Dimitri Ped, RN since 03/30/2022 12:00 AM  Completed 03/30/2022   Problem: Disease Management and Care Coordination Needs(HTN, RA, chronic pain, osteoporosis) Resolved 03/30/2022  Priority: High     Long-Range Goal: Establish Plan of Care for Care Coordination and Disease Management Needs(HTN, RA, chronic pain, osteoporosis) Completed 03/30/2022  Start Date: 01/15/2022  Expected End Date: 01/15/2023  Priority: High  Note:   Case closed goals met Current Barriers:  Knowledge Deficits related to plan of care for management of HTN, Osteoporosis, and RA, chronic pain  Chronic Disease Management support and education needs related to HTN, Osteoporosis, and RA, chronic pain    States she now is staying alone and is independent taking care of herself.  States her back pain is controlled with her pain medications. States she is waiting for her delivery of her pain medications and she will be out if it does not come today.  States she has called [facility] and it has been mailed out. States from now on she will need to get her refills sent in 2 week in advance. Denies any recent falls. States she is going to PT for strengthening and she feels her balance is  getting better. States she is still thinking about getting an emergency call alarm but has not called to get yet.  States she does use her walker or Rolator all of the time.  States she does not check her B/P regularly as it has been good since she has lost about 100 lbs in the last few yrs. States she tries to follow a low sodium diet  RNCM Clinical Goal(s):  Patient will verbalize understanding of plan for management of HTN, Osteoporosis, and RA, chronic pain  as evidenced by voiced adherence to plan of care verbalize basic understanding of  HTN, Osteoporosis, and RA, chronic pain  disease process and self health management plan as evidenced by voiced understanding and teach back  take all medications exactly as prescribed and will call provider for medication related questions as evidenced by dispense report and pt verbalization  attend all scheduled medical appointments: Neurology 08/07/22, Dr. Elease Hashimoto 04/25/22 as evidenced by medical records demonstrate Improved adherence to prescribed treatment plan for HTN, Osteoporosis, and RA, chronic pain  as evidenced by readings within limits, voiced adherence to plan of care continue to work with RN Care Manager to address care management and care coordination needs related to  HTN,  Osteoporosis, and RA, chronic pain  as evidenced by adherence to CM Team Scheduled appointments through collaboration with RN Care manager, provider, and care team.   Interventions: 1:1 collaboration with primary care provider regarding development and update of comprehensive plan of care as evidenced by provider attestation and co-signature Inter-disciplinary care team collaboration (see longitudinal plan of care) Evaluation of current treatment plan related to  self management and patient's adherence to plan as established by provider   Falls Interventions:  (Status:  Goal Met.) Long Term Goal Provided written and verbal education re: potential causes of falls and Fall  prevention strategies Reviewed medications and discussed potential side effects of medications such as dizziness and frequent urination Advised patient of importance of notifying provider of falls Assessed for falls since last encounter Provided patient information for fall alert systems Reviewed importance of having a fall alert system since she lives alone   Hypertension Interventions:  (Status:  Goal Met.) Long Term Goal Last practice recorded BP readings:  BP Readings from Last 3 Encounters:  01/24/22 116/70  01/23/22 116/74  10/27/21 136/70  Most recent eGFR/CrCl:  Lab Results  Component Value Date   EGFR 41 (L) 12/29/2020    No components found for: CRCL  Evaluation of current treatment plan related to hypertension self management and patient's adherence to plan as established by provider Provided education to patient re: stroke prevention, s/s of heart attack and stroke Reviewed medications with patient and discussed importance of compliance Advised patient, providing education and rationale, to monitor blood pressure daily and record, calling PCP for findings outside established parameters Provided education on prescribed diet low sodium Discussed complications of poorly controlled blood pressure such as heart disease, stroke, circulatory complications, vision complications, kidney impairment, sexual dysfunction  Pain/Rheumatoid Arthritis Interventions:  (Status:  Goal Met.) Long Term Goal Pain assessment performed Medications reviewed Reviewed provider established plan for pain management Discussed importance of adherence to all scheduled medical appointments Counseled on the importance of reporting any/all new or changed pain symptoms or management strategies to pain management provider Advised patient to report to care team affect of pain on daily activities Discussed use of relaxation techniques and/or diversional activities to assist with pain reduction (distraction,  imagery, relaxation, massage, acupressure, TENS, heat, and cold application Reviewed with patient prescribed pharmacological and nonpharmacological pain relief strategies Reviewed to call provider if her pain medication is not delivered today to try to get partial  fill sent to a local pharmacy  Patient Goals/Self-Care Activities: Take all medications as prescribed Attend all scheduled provider appointments Call pharmacy for medication refills 3-7 days in advance of running out of medications Perform all self care activities independently  Call provider office for new concerns or questions  Consider getting a fall alert system check blood pressure weekly choose a place to take my blood pressure (home, clinic or office, retail store) write blood pressure results in a log or diary call doctor for signs and symptoms of high blood pressure begin an exercise program eat more whole grains, fruits and vegetables, lean meats and healthy fats limit salt intake to 2366m/day  Follow Up Plan:  The patient has been provided with contact information for the care management team and has been advised to call with any health related questions or concerns.  No further follow up required: Case closed goals met       Plan: The patient has been provided with contact information for the care management team and has been advised to call with  any health related questions or concerns.  No further follow up required: Case closed goals met  Peter Garter RN, [facility], CDE Care Management Coordinator [facility] [phone]

## 2022-03-30 NOTE — Patient Instructions (Signed)
Visit Information Case closed goals met Thank you for allowing me to share the care management and care coordination services that are available to you as part of your health plan and services through your primary care provider and medical home. Please reach out to me at [phone] if the care management/care coordination team may be of assistance to you in the future.   Prerna Harold RN, BSN,CCM, CDE Care Management Coordinator [facility] [phone]   

## 2022-04-05 DIAGNOSIS — M7918 Myalgia, other site: Secondary | ICD-10-CM | POA: Diagnosis not present

## 2022-04-05 DIAGNOSIS — M545 Low back pain, unspecified: Secondary | ICD-10-CM | POA: Diagnosis not present

## 2022-04-05 DIAGNOSIS — Z981 Arthrodesis status: Secondary | ICD-10-CM | POA: Diagnosis not present

## 2022-04-05 DIAGNOSIS — Z9181 History of falling: Secondary | ICD-10-CM | POA: Diagnosis not present

## 2022-04-25 ENCOUNTER — Ambulatory Visit (INDEPENDENT_AMBULATORY_CARE_PROVIDER_SITE_OTHER): Payer: Medicare Other | Admitting: Family Medicine

## 2022-04-25 ENCOUNTER — Encounter: Payer: Self-pay | Admitting: Family Medicine

## 2022-04-25 VITALS — BP 120/72 | HR 85 | Temp 97.9°F | Ht 61.0 in | Wt 127.3 lb

## 2022-04-25 DIAGNOSIS — G8929 Other chronic pain: Secondary | ICD-10-CM | POA: Diagnosis not present

## 2022-04-25 DIAGNOSIS — I10 Essential (primary) hypertension: Secondary | ICD-10-CM | POA: Diagnosis not present

## 2022-04-25 DIAGNOSIS — M545 Low back pain, unspecified: Secondary | ICD-10-CM

## 2022-04-25 DIAGNOSIS — L853 Xerosis cutis: Secondary | ICD-10-CM | POA: Diagnosis not present

## 2022-04-25 DIAGNOSIS — R42 Dizziness and giddiness: Secondary | ICD-10-CM

## 2022-04-25 DIAGNOSIS — R0989 Other specified symptoms and signs involving the circulatory and respiratory systems: Secondary | ICD-10-CM | POA: Diagnosis not present

## 2022-04-25 MED ORDER — CLOBETASOL PROPIONATE 0.05 % EX FOAM: Freq: Two times a day (BID) | CUTANEOUS | 2 refills | Status: DC

## 2022-04-25 NOTE — Progress Notes (Signed)
Established Patient Office Visit  Subjective   Patient ID: [name], female    DOB: 1941-04-15  Age: 81 y.o. MRN: [id number]  Chief Complaint  Patient presents with   Follow-up    HPI   [name] is seen to discuss several items as follows  She has chronic neck and back pain.  She has been on opioids for many years and currently stable on regimen of oxycodone extended release 10 mg every 12 hours.  No recent constipation issues.  She has had some issues intermittently in the past.  Pain reasonably well controlled.  She had episode couple weeks ago of transient vertigo.  This lasted for several days but fully resolved at this time.  No ataxia.  No focal weakness.  No recent hearing changes.  She has dry skin especially involving her back and neck and occipital scalp region.  Has difficulty reaching her back.  She does take very hot showers.  Struggles to get moisturizer on areas like her back.  She has fatigue issues.  She apparently is only getting a few hours sleep many nights.  Recent stress dealing with son-in-law and she thinks that may be triggering some sleep disturbance.  She prefers to avoid any further medications.  No regular alcohol use.  Avoid late day use of caffeine.  Occasional sensation of air hunger when seated at rest.  Not with ambulation.  No orthopnea.  No recent increased peripheral edema.  No chest pain.  Past Medical History:  Diagnosis Date   Allergic rhinitis    Anemia    Anxiety    Bronchiectasis    Carpal tunnel syndrome 07/13/2015   Bilateral   CVA (cerebral infarction) 10/2007   Right thalamic    Diverticulosis of colon    Gait disorder    GERD (gastroesophageal reflux disease)    History of breast cancer    HTN (hypertension)    Hyperlipidemia    Idiopathic pulmonary fibrosis    Left knee DJD    Migraine    Osteoporosis    Peripheral neuropathy    PMR (polymyalgia rheumatica) (HCC)    Polyneuropathy in other diseases classified  elsewhere ([place]) 02/17/2013   Previous back surgery 10/09/12   Renal insufficiency    Rheumatoid arthritis (HCC)    Seizure disorder (HCC)    Spondylosis, cervical, with myelopathy 08/31/2015   C3-4 myelopathy   Temporal arteritis (HCC)    Right eye blind, on steroids per [facility]/ Dr [name]   Type II or unspecified type diabetes mellitus without mention of complication, not stated as uncontrolled    2nd to steriods   Vitamin D deficiency    Past Surgical History:  Procedure Laterality Date   APPENDECTOMY  01/2021   CARPAL TUNNEL RELEASE Right    CATARACT EXTRACTION     OS - Summer 2010   CERVICAL FUSION  09/24/2008   4 rods and pins in place   CHOLECYSTECTOMY     COLONOSCOPY  12/15/2011   Procedure: COLONOSCOPY;  Surgeon: [name], MD;  Location: [facility];  Service: Endoscopy;  Laterality: N/A;   LUMBAR FUSION  04/24/2010   W/Mechanical fixation - [facility]   LUMBAR FUSION  09/25/2011   rods in hips (to stabilize).   MASTECTOMY     NECK SURGERY     rheumatoid nodule removal     SPINAL FUSION  07/25/2010   T10-L2 interbody fusion / [place]  Left    reports that she has never smoked. She has never used smokeless tobacco. She reports that she does not drink alcohol and does not use drugs. family history includes Arthritis in her father and mother; Brain cancer in her mother; Breast cancer in her sister; Dementia in her father; Hypertension in her father and mother; Lung cancer in her sister. Allergies  Allergen Reactions   Hydromorphone Other (See Comments)    Cognitive changes  "made me unconscious"    Review of Systems  Constitutional:  Negative for weight loss.  Respiratory:  Negative for cough and wheezing.        See HPI  Cardiovascular:  Negative for chest pain and leg swelling.  Gastrointestinal:  Negative for abdominal pain.  Genitourinary:  Negative for dysuria.  Neurological:        See  HPI      Objective:     BP 120/72 (BP Location: Left Arm, Patient Position: Sitting, Cuff Size: Normal)   Pulse 85   Temp 97.9 F (36.6 C) (Oral)   Ht '5\' 1"'$  (1.549 m)   Wt 127 lb 4.8 oz (57.7 kg)   SpO2 96%   BMI 24.05 kg/m    Physical Exam Vitals reviewed.  Constitutional:      Appearance: Normal appearance.  HENT:     Head: Normocephalic and atraumatic.  Cardiovascular:     Rate and Rhythm: Normal rate and regular rhythm.  Pulmonary:     Effort: Pulmonary effort is normal.     Breath sounds: Normal breath sounds. No wheezing or rales.  Musculoskeletal:     Right lower leg: No edema.     Left lower leg: No edema.  Skin:    Comments: Generally very dry skin especially involving her back and upper extremities.  She has a few excoriations on her upper back.  Neurological:     General: No focal deficit present.     Mental Status: She is alert.     Cranial Nerves: No cranial nerve deficit.      No results found for any visits on 04/25/22.    The ASCVD Risk score (Arnett DK, et al., 2019) failed to calculate for the following reasons:   The 2019 ASCVD risk score is only valid for ages 46 to 34    Assessment & Plan:   #1 chronic neck and back pain.  She has been on chronic opioids for years and pain fairly well controlled with OxyContin 10 mg every 12 hours.  Does not need refills at this time.  Had recent drug screen.  #2 dry skin dermatitis.  Discussed importance of avoiding prolonged bathing and also avoiding hot water.  Recommend liberal use of moisturizers especially after bathing.  We also wrote for some Olux foam for scalp involvement.  Use once or twice daily as needed  #3 recent transient vertigo.  Sounds like probably benign vertigo.  Symptoms fully resolved at this time.  Follow-up for any recurrence.  #4 fatigue probably related to recent insomnia.  Very poor sleep quality.  Sleep hygiene reviewed with handout given.  Avoid sedative hypnotics if  possible  #5 sensation of air hunger.  No exertional dyspnea.  She states she frequently has difficulty getting a full deep breath.  Doubt primary pulmonary or cardiac source.   Return in about 3 months (around 07/26/2022).    Carolann Littler, MD

## 2022-04-30 DIAGNOSIS — H04123 Dry eye syndrome of bilateral lacrimal glands: Secondary | ICD-10-CM | POA: Diagnosis not present

## 2022-04-30 DIAGNOSIS — H52202 Unspecified astigmatism, left eye: Secondary | ICD-10-CM | POA: Diagnosis not present

## 2022-05-08 DIAGNOSIS — M0589 Other rheumatoid arthritis with rheumatoid factor of multiple sites: Secondary | ICD-10-CM | POA: Diagnosis not present

## 2022-05-09 DIAGNOSIS — Z6822 Body mass index (BMI) 22.0-22.9, adult: Secondary | ICD-10-CM | POA: Diagnosis not present

## 2022-05-09 DIAGNOSIS — M5136 Other intervertebral disc degeneration, lumbar region: Secondary | ICD-10-CM | POA: Diagnosis not present

## 2022-05-09 DIAGNOSIS — M79645 Pain in left finger(s): Secondary | ICD-10-CM | POA: Diagnosis not present

## 2022-05-09 DIAGNOSIS — M0589 Other rheumatoid arthritis with rheumatoid factor of multiple sites: Secondary | ICD-10-CM | POA: Diagnosis not present

## 2022-05-09 DIAGNOSIS — M316 Other giant cell arteritis: Secondary | ICD-10-CM | POA: Diagnosis not present

## 2022-05-09 DIAGNOSIS — M150 Primary generalized (osteo)arthritis: Secondary | ICD-10-CM | POA: Diagnosis not present

## 2022-05-09 DIAGNOSIS — J8410 Pulmonary fibrosis, unspecified: Secondary | ICD-10-CM | POA: Diagnosis not present

## 2022-05-09 DIAGNOSIS — M79672 Pain in left foot: Secondary | ICD-10-CM | POA: Diagnosis not present

## 2022-05-09 DIAGNOSIS — M810 Age-related osteoporosis without current pathological fracture: Secondary | ICD-10-CM | POA: Diagnosis not present

## 2022-05-09 DIAGNOSIS — N184 Chronic kidney disease, stage 4 (severe): Secondary | ICD-10-CM | POA: Diagnosis not present

## 2022-05-16 DIAGNOSIS — I129 Hypertensive chronic kidney disease with stage 1 through stage 4 chronic kidney disease, or unspecified chronic kidney disease: Secondary | ICD-10-CM | POA: Diagnosis not present

## 2022-05-16 DIAGNOSIS — N1832 Chronic kidney disease, stage 3b: Secondary | ICD-10-CM | POA: Diagnosis not present

## 2022-05-16 DIAGNOSIS — N183 Chronic kidney disease, stage 3 unspecified: Secondary | ICD-10-CM | POA: Diagnosis not present

## 2022-05-16 DIAGNOSIS — D631 Anemia in chronic kidney disease: Secondary | ICD-10-CM | POA: Diagnosis not present

## 2022-05-16 DIAGNOSIS — N2581 Secondary hyperparathyroidism of renal origin: Secondary | ICD-10-CM | POA: Diagnosis not present

## 2022-05-16 LAB — CBC AND DIFFERENTIAL
HCT: 41 (ref 36–46)
Hemoglobin: 13.8 (ref 12.0–16.0)
Platelets: 217 10*3/uL (ref 150–400)
WBC: 8.3

## 2022-05-16 LAB — COMPREHENSIVE METABOLIC PANEL
Albumin: 4.2 (ref 3.5–5.0)
Calcium: 9.5 (ref 8.7–10.7)
eGFR: 46

## 2022-05-16 LAB — BASIC METABOLIC PANEL
BUN: 19 (ref 4–21)
CO2: 31 — AB (ref 13–22)
Chloride: 101 (ref 99–108)
Creatinine: 1.2 — AB (ref 0.5–1.1)
Glucose: 94
Potassium: 4 mEq/L (ref 3.5–5.1)
Sodium: 138 (ref 137–147)

## 2022-05-16 LAB — CBC: RBC: 4.37 (ref 3.87–5.11)

## 2022-05-17 ENCOUNTER — Telehealth: Payer: Medicare Other

## 2022-05-17 ENCOUNTER — Telehealth: Payer: Self-pay | Admitting: Family Medicine

## 2022-05-17 NOTE — Telephone Encounter (Signed)
Last OV- 04/25/22 Last refill- 03/15/22--60 tabs, 0 refills  Next OV- 07/27/2022.   Pharmacy updated

## 2022-05-17 NOTE — Telephone Encounter (Signed)
Pt requesting refill of oxyCODONE (OXYCONTIN) 10 mg 12 hr tablet   [place], [place] Phone:  [phone]  Fax:  [phone]

## 2022-05-17 NOTE — Telephone Encounter (Signed)
Pt is calling and would like to have cologuard. Pt had one benign polyp about 30 yrs ago. Pt does not want to do another colonscopy

## 2022-05-18 MED ORDER — OXYCODONE HCL ER 10 MG PO T12A: 10.0000 mg | EXTENDED_RELEASE_TABLET | Freq: Two times a day (BID) | ORAL | 0 refills | Status: DC

## 2022-05-18 NOTE — Telephone Encounter (Signed)
Patient informed of the message and expressed understanding  

## 2022-05-21 ENCOUNTER — Encounter: Payer: Self-pay | Admitting: Podiatry

## 2022-05-21 ENCOUNTER — Ambulatory Visit (INDEPENDENT_AMBULATORY_CARE_PROVIDER_SITE_OTHER): Payer: Medicare Other | Admitting: Podiatry

## 2022-05-21 DIAGNOSIS — M778 Other enthesopathies, not elsewhere classified: Secondary | ICD-10-CM

## 2022-05-21 DIAGNOSIS — L84 Corns and callosities: Secondary | ICD-10-CM

## 2022-05-21 MED ORDER — TRIAMCINOLONE ACETONIDE 10 MG/ML IJ SUSP
10.0000 mg | Freq: Once | INTRAMUSCULAR | Status: AC
  Administered 2022-05-21: 10 mg

## 2022-05-22 NOTE — Progress Notes (Signed)
Subjective:   Patient ID: [name], female   DOB: 81 y.o.   MRN: [id number]   HPI Patient states that she has had some inflammation and lesion around the middle of the left arch and states that its been inflamed and that she is does well with orthotics but does not wear them all the time   ROS      Objective:  Physical Exam  Patient has a relative collapse medial longitudinal arch with prominence of the navicular cuneiform that causes pressure against the plantar surface with fluid buildup around the area localized     Assessment:  Inflammatory condition with probability for capsular inflammation of the joint surface with collapse arch pathology     Plan:  Reviewed with her and caregiver and we could consider shaving bone but we will get a try conservative at this point and I did do sterile prep and I injected the plantar capsule 3 mg Dexasone Kenalog 5 mg Xylocaine debrided lesion encouraged orthotics and discussed possible shaving of bones if symptoms persist or problems were to develop.  Reappoint as indicated

## 2022-05-24 ENCOUNTER — Encounter: Payer: Self-pay | Admitting: Family Medicine

## 2022-05-30 ENCOUNTER — Encounter: Payer: Self-pay | Admitting: Family Medicine

## 2022-05-30 ENCOUNTER — Ambulatory Visit (INDEPENDENT_AMBULATORY_CARE_PROVIDER_SITE_OTHER): Payer: Medicare Other | Admitting: Family Medicine

## 2022-05-30 VITALS — BP 134/60 | HR 75 | Temp 98.2°F | Ht 61.0 in | Wt 131.4 lb

## 2022-05-30 DIAGNOSIS — K5903 Drug induced constipation: Secondary | ICD-10-CM

## 2022-05-30 DIAGNOSIS — T402X5A Adverse effect of other opioids, initial encounter: Secondary | ICD-10-CM | POA: Diagnosis not present

## 2022-05-30 NOTE — Progress Notes (Signed)
Established Patient Office Visit  Subjective   Patient ID: [name], female    DOB: 03-Jul-1941  Age: 81 y.o. MRN: [id number]  Chief Complaint  Patient presents with   Constipation    Patient complains of constipation, x2 days, Tried Docusate with little relief     HPI   [name] is seen with intermittent constipation issues.  She has been on chronic opioids for chronic back pain for many years.  Generally has done well for the most part but seems to have had more issues recently.  She sometimes goes up to 7 to 9 days between stools but usually 3-5 is more common.  Also has very small caliber stools.  Sometimes has to strain.  She recently went to the pharmacy and was advised to try Senokot-S and Dulcolax which did provide some relief.  Denies any abdominal pain.  Occasionally has abdominal bloating.  No nausea or vomiting.  No recent bloody stools.  She is on OxyContin and takes 10 mg twice daily.  Does eat some fiber in the form of cereal such as raisin bran and some fruits.  Not a lot of vegetables generally.  Past Medical History:  Diagnosis Date   Allergic rhinitis    Anemia    Anxiety    Bronchiectasis    Carpal tunnel syndrome 07/13/2015   Bilateral   CVA (cerebral infarction) 10/2007   Right thalamic    Diverticulosis of colon    Gait disorder    GERD (gastroesophageal reflux disease)    History of breast cancer    HTN (hypertension)    Hyperlipidemia    Idiopathic pulmonary fibrosis    Left knee DJD    Migraine    Osteoporosis    Peripheral neuropathy    PMR (polymyalgia rheumatica) (HCC)    Polyneuropathy in other diseases classified elsewhere ([place]) 02/17/2013   Previous back surgery 10/09/12   Renal insufficiency    Rheumatoid arthritis (HCC)    Seizure disorder (HCC)    Spondylosis, cervical, with myelopathy 08/31/2015   C3-4 myelopathy   Temporal arteritis (HCC)    Right eye blind, on steroids per [facility]/ Dr [name]   Type II or unspecified type  diabetes mellitus without mention of complication, not stated as uncontrolled    2nd to steriods   Vitamin D deficiency    Past Surgical History:  Procedure Laterality Date   APPENDECTOMY  01/2021   CARPAL TUNNEL RELEASE Right    CATARACT EXTRACTION     OS - Summer 2010   CERVICAL FUSION  09/24/2008   4 rods and pins in place   CHOLECYSTECTOMY     COLONOSCOPY  12/15/2011   Procedure: COLONOSCOPY;  Surgeon: [name], MD;  Location: [facility];  Service: Endoscopy;  Laterality: N/A;   LUMBAR FUSION  04/24/2010   W/Mechanical fixation - [facility]   LUMBAR FUSION  09/25/2011   rods in hips (to stabilize).   MASTECTOMY     NECK SURGERY     rheumatoid nodule removal     SPINAL FUSION  07/25/2010   T10-L2 interbody fusion / [facility]     Left    reports that she has never smoked. She has never used smokeless tobacco. She reports that she does not drink alcohol and does not use drugs. family history includes Arthritis in her father and mother; Brain cancer in her mother; Breast cancer in her sister; Dementia  in her father; Hypertension in her father and mother; Lung cancer in her sister. Allergies  Allergen Reactions   Hydromorphone Other (See Comments)    Cognitive changes  "made me unconscious"    Review of Systems  Constitutional:  Negative for chills and fever.  Gastrointestinal:  Positive for constipation. Negative for abdominal pain, blood in stool, melena, nausea and vomiting.      Objective:     BP 134/60 (BP Location: Left Arm, Patient Position: Sitting, Cuff Size: Normal)   Pulse 75   Temp 98.2 F (36.8 C) (Oral)   Ht '5\' 1"'$  (1.549 m)   Wt 131 lb 6.4 oz (59.6 kg)   SpO2 98%   BMI 24.83 kg/m    Physical Exam Vitals reviewed.  Constitutional:      Appearance: Normal appearance.  Cardiovascular:     Rate and Rhythm: Normal rate and regular rhythm.  Pulmonary:     Effort: Pulmonary effort  is normal.     Breath sounds: Normal breath sounds.  Genitourinary:    Comments: Digital exam with nurse present reveals no impaction.  She does have some small pellet-like stool in the rectal vault but not impacted. Neurological:     Mental Status: She is alert.      No results found for any visits on 05/30/22.    The ASCVD Risk score (Arnett DK, et al., 2019) failed to calculate for the following reasons:   The 2019 ASCVD risk score is only valid for ages 75 to 76    Assessment & Plan:   Constipation.  Symptoms worsened by chronic opioid use.  No evidence for impaction  -Increase recent fluid intake -Continue stool softener -Recommend that she avoid regular use of stimulant laxatives and try MiraLAX instead. -Can supplement with Senokot S occasionally as needed -Increase fiber intake to 25 g daily and if necessary consider supplement with FiberCon, Citrucel, or Metamucil. -consider pharmacologic options such as Amitiza if symptoms persist in spite of above.    No follow-ups on file.    Carolann Littler, MD

## 2022-05-30 NOTE — Patient Instructions (Signed)
Try OTC Miralax 17 grams once daily  Increase fiber intake- at least 25 grams per day.  Try to reduce caffeine intake  Increase water intake  May continue with the stool softener as needed.    If above not working we can look at prescription medications.

## 2022-05-31 ENCOUNTER — Telehealth: Payer: Self-pay | Admitting: Family Medicine

## 2022-05-31 MED ORDER — OXYCODONE HCL ER 10 MG PO T12A: 10.0000 mg | EXTENDED_RELEASE_TABLET | Freq: Two times a day (BID) | ORAL | 0 refills | Status: DC

## 2022-05-31 NOTE — Telephone Encounter (Signed)
Pt called to say she mistakenly threw away her medication from [facility] last night.  oxyCODONE (OXYCONTIN) 10 mg 12 hr tablet  Pt called [facility] and was told she would need to call MD and ask him to send in another Rx to the pharmacy:    [facility] [place] [address] Phone:  [phone]  Fax:  [phone]     This is because [facility] would take at least a week to refill and [facility] could get it to her sooner, as she only has one pill left.  When [facility] gets the Rx from MD, it will be shown as rejected. Pt will then call [phone], as per [facility] Patient Care Advocate.  Rx will then be over-written because of accidental throwing away of medication.  Please advise.

## 2022-05-31 NOTE — Telephone Encounter (Signed)
I sent in refill

## 2022-06-03 ENCOUNTER — Other Ambulatory Visit: Payer: Self-pay | Admitting: Family Medicine

## 2022-06-25 ENCOUNTER — Ambulatory Visit (INDEPENDENT_AMBULATORY_CARE_PROVIDER_SITE_OTHER): Payer: Medicare Other

## 2022-06-25 VITALS — BP 122/62 | HR 67 | Temp 98.1°F | Ht 61.0 in | Wt 130.0 lb

## 2022-06-25 DIAGNOSIS — Z0000 Encounter for general adult medical examination without abnormal findings: Secondary | ICD-10-CM | POA: Diagnosis not present

## 2022-06-25 NOTE — Patient Instructions (Addendum)
[name] , Thank you for taking time to come for your Medicare Wellness Visit. I appreciate your ongoing commitment to your health goals. Please review the following plan we discussed and let me know if I can assist you in the future.   These are the goals we discussed:  Goals       Eat more fiber (pt-stated)      Eat more fruits and vegetables      Would like to increase her vegetables K and w can pick up vegetables for a week      Exercise 3x per week (30 min per time)        This is a list of the screening recommended for you and due dates:  Health Maintenance  Topic Date Due   Eye exam for diabetics  Never done   Complete foot exam   04/26/2018   Hemoglobin A1C  04/29/2018   COVID-19 Vaccine (3 - Pfizer risk series) 07/11/2022*   Zoster (Shingles) Vaccine (1 of 2) 09/25/2022*   Flu Shot  12/23/2022*   Yearly kidney health urinalysis for diabetes  09/01/2022   Mammogram  10/23/2022   Yearly kidney function blood test for diabetes  05/17/2023   Tetanus Vaccine  02/26/2030   Pneumonia Vaccine  Completed   DEXA scan (bone density measurement)  Completed   HPV Vaccine  Aged Out  *Topic was postponed. The date shown is not the original due date.  Opioid Pain Medicine Management Opioids are powerful medicines that are used to treat moderate to severe pain. When used for short periods of time, they can help you to: Sleep better. Do better in physical or occupational therapy. Feel better in the first few days after an injury. Recover from surgery. Opioids should be taken with the supervision of a trained health care provider. They should be taken for the shortest period of time possible. This is because opioids can be addictive, and the longer you take opioids, the greater your risk of addiction. This addiction can also be called opioid use disorder. What are the risks? Using opioid pain medicines for longer than 3 days increases your risk of side effects. Side effects  include: Constipation. Nausea and vomiting. Breathing difficulties (respiratory depression). Drowsiness. Confusion. Opioid use disorder. Itching. Taking opioid pain medicine for a long period of time can affect your ability to do daily tasks. It also puts you at risk for: Motor vehicle crashes. Depression. Suicide. Heart attack. Overdose, which can be life-threatening. What is a pain treatment plan? A pain treatment plan is an agreement between you and your health care provider. Pain is unique to each person, and treatments vary depending on your condition. To manage your pain, you and your health care provider need to work together. To help you do this: Discuss the goals of your treatment, including how much pain you might expect to have and how you will manage the pain. Review the risks and benefits of taking opioid medicines. Remember that a good treatment plan uses more than one approach and minimizes the chance of side effects. Be honest about the amount of medicines you take and about any drug or alcohol use. Get pain medicine prescriptions from only one health care provider. Pain can be managed with many types of alternative treatments. Ask your health care provider to refer you to one or more specialists who can help you manage pain through: Physical or occupational therapy. Counseling (cognitive behavioral therapy). Good nutrition. Biofeedback. Massage. Meditation. Non-opioid medicine. Following  a gentle exercise program. How to use opioid pain medicine Taking medicine Take your pain medicine exactly as told by your health care provider. Take it only when you need it. If your pain gets less severe, you may take less than your prescribed dose if your health care provider approves. If you are not having pain, do nottake pain medicine unless your health care provider tells you to take it. If your pain is severe, do nottry to treat it yourself by taking more pills than  instructed on your prescription. Contact your health care provider for help. Write down the times when you take your pain medicine. It is easy to become confused while on pain medicine. Writing the time can help you avoid overdose. Take other over-the-counter or prescription medicines only as told by your health care provider. Keeping yourself and others safe  While you are taking opioid pain medicine: Do not drive, use machinery, or power tools. Do not sign legal documents. Do not drink alcohol. Do not take sleeping pills. Do not supervise children by yourself. Do not do activities that require climbing or being in high places. Do not go to a lake, river, ocean, spa, or swimming pool. Do not share your pain medicine with anyone. Keep pain medicine in a locked cabinet or in a secure area where pets and children cannot reach it. Stopping your use of opioids If you have been taking opioid medicine for more than a few weeks, you may need to slowly decrease (taper) how much you take until you stop completely. Tapering your use of opioids can decrease your risk of symptoms of withdrawal, such as: Pain and cramping in the abdomen. Nausea. Sweating. Sleepiness. Restlessness. Uncontrollable shaking (tremors). Cravings for the medicine. Do not attempt to taper your use of opioids on your own. Talk with your health care provider about how to do this. Your health care provider may prescribe a step-down schedule based on how much medicine you are taking and how long you have been taking it. Getting rid of leftover pills Do not save any leftover pills. Get rid of leftover pills safely by: Taking the medicine to a prescription take-back program. This is usually offered by the county or law enforcement. Bringing them to a pharmacy that has a drug disposal container. Flushing them down the toilet. Check the label or package insert of your medicine to see whether this is safe to do. Throwing them out in  the trash. Check the label or package insert of your medicine to see whether this is safe to do. If it is safe to throw it out, remove the medicine from the original container, put it into a sealable bag or container, and mix it with used coffee grounds, food scraps, dirt, or cat litter before putting it in the trash. Follow these instructions at home: Activity Do exercises as told by your health care provider. Avoid activities that make your pain worse. Return to your normal activities as told by your health care provider. Ask your health care provider what activities are safe for you. General instructions You may need to take these actions to prevent or treat constipation: Drink enough fluid to keep your urine pale yellow. Take over-the-counter or prescription medicines. Eat foods that are high in fiber, such as beans, whole grains, and fresh fruits and vegetables. Limit foods that are high in fat and processed sugars, such as fried or sweet foods. Keep all follow-up visits. This is important. Where to find support If you  have been taking opioids for a long time, you may benefit from receiving support for quitting from a local support group or counselor. Ask your health care provider for a referral to these resources in your area. Where to find more information Centers for Disease Control and Prevention (CDC): http://www.wolf.info/ [place] Food and Drug Administration (FDA): [place] Get help right away if: You may have taken too much of an opioid (overdosed). Common symptoms of an overdose: Your breathing is slower or more shallow than normal. You have a very slow heartbeat (pulse). You have slurred speech. You have nausea and vomiting. Your pupils become very small. You have other potential symptoms: You are very confused. You faint or feel like you will faint. You have cold, clammy skin. You have blue lips or fingernails. You have thoughts of harming yourself or harming others. These  symptoms may represent a serious problem that is an emergency. Do not wait to see if the symptoms will go away. Get medical help right away. Call your local emergency services (911 in the [place] Do not drive yourself to the hospital.  If you ever feel like you may hurt yourself or others, or have thoughts about taking your own life, get help right away. Go to your nearest emergency department or: Call your local emergency services (911 in the [place] Call the [facility] 858-532-0978 in the [place] Call a suicide crisis helpline, such as the [facility] at 531-325-2922 or 988 in the [place]. This is open 24 hours a day in the [place] Text the Crisis Text Line at 803-721-8409 (in the [place].). Summary Opioid medicines can help you manage moderate to severe pain for a short period of time. A pain treatment plan is an agreement between you and your health care provider. Discuss the goals of your treatment, including how much pain you might expect to have and how you will manage the pain. If you think that you or someone else may have taken too much of an opioid, get medical help right away. This information is not intended to replace advice given to you by your health care provider. Make sure you discuss any questions you have with your health care provider. Document Revised: 04/05/2021 Document Reviewed: 12/21/2020 Elsevier Patient Education  [place] directives: Copy in Chart  Conditions/risks identified: None  Next appointment: Follow up in one year for your annual wellness visit.   Preventive Care 23 Years and Older, Female  Preventive care refers to lifestyle choices and visits with your health care provider that can promote health and wellness. What does preventive care include? A yearly physical exam. This is also called an annual well check. Dental exams once or twice a year. Routine eye exams. Ask your health care provider how often  you should have your eyes checked. Personal lifestyle choices, including: Daily care of your teeth and gums. Regular physical activity. Eating a healthy diet. Avoiding tobacco and drug use. Limiting alcohol use. Practicing safe sex. Taking low doses of aspirin every day. Taking vitamin and mineral supplements as recommended by your health care provider. What happens during an annual well check? The services and screenings done by your health care provider during your annual well check will depend on your age, overall health, lifestyle risk factors, and family history of disease. Counseling  Your health care provider may ask you questions about your: Alcohol use. Tobacco use. Drug use. Emotional well-being. Home and relationship well-being. Sexual activity. Eating habits.  History of falls. Memory and ability to understand (cognition). Work and work Statistician. Screening  You may have the following tests or measurements: Height, weight, and BMI. Blood pressure. Lipid and cholesterol levels. These may be checked every 5 years, or more frequently if you are over 39 years old. Skin check. Lung cancer screening. You may have this screening every year starting at age 96 if you have a 30-pack-year history of smoking and currently smoke or have quit within the past 15 years. Fecal occult blood test (FOBT) of the stool. You may have this test every year starting at age 23. Flexible sigmoidoscopy or colonoscopy. You may have a sigmoidoscopy every 5 years or a colonoscopy every 10 years starting at age 11. Prostate cancer screening. Recommendations will vary depending on your family history and other risks. Hepatitis C blood test. Hepatitis B blood test. Sexually transmitted disease (STD) testing. Diabetes screening. This is done by checking your blood sugar (glucose) after you have not eaten for a while (fasting). You may have this done every 1-3 years. Abdominal aortic aneurysm (AAA)  screening. You may need this if you are a current or former smoker. Osteoporosis. You may be screened starting at age 78 if you are at high risk. Talk with your health care provider about your test results, treatment options, and if necessary, the need for more tests. Vaccines  Your health care provider may recommend certain vaccines, such as: Influenza vaccine. This is recommended every year. Tetanus, diphtheria, and acellular pertussis (Tdap, Td) vaccine. You may need a Td booster every 10 years. Zoster vaccine. You may need this after age 82. Pneumococcal 13-valent conjugate (PCV13) vaccine. One dose is recommended after age 42. Pneumococcal polysaccharide (PPSV23) vaccine. One dose is recommended after age 50. Talk to your health care provider about which screenings and vaccines you need and how often you need them. This information is not intended to replace advice given to you by your health care provider. Make sure you discuss any questions you have with your health care provider. Document Released: 10/07/2015 Document Revised: 05/30/2016 Document Reviewed: 07/12/2015 Elsevier Interactive Patient Education  2017 [place] Prevention in the Home Falls can cause injuries. They can happen to people of all ages. There are many things you can do to make your home safe and to help prevent falls. What can I do on the outside of my home? Regularly fix the edges of walkways and driveways and fix any cracks. Remove anything that might make you trip as you walk through a door, such as a raised step or threshold. Trim any bushes or trees on the path to your home. Use bright outdoor lighting. Clear any walking paths of anything that might make someone trip, such as rocks or tools. Regularly check to see if handrails are loose or broken. Make sure that both sides of any steps have handrails. Any raised decks and porches should have guardrails on the edges. Have any leaves, snow, or ice  cleared regularly. Use sand or salt on walking paths during winter. Clean up any spills in your garage right away. This includes oil or grease spills. What can I do in the bathroom? Use night lights. Install grab bars by the toilet and in the tub and shower. Do not use towel bars as grab bars. Use non-skid mats or decals in the tub or shower. If you need to sit down in the shower, use a plastic, non-slip stool. Keep the floor dry. Clean up any  water that spills on the floor as soon as it happens. Remove soap buildup in the tub or shower regularly. Attach bath mats securely with double-sided non-slip rug tape. Do not have throw rugs and other things on the floor that can make you trip. What can I do in the bedroom? Use night lights. Make sure that you have a light by your bed that is easy to reach. Do not use any sheets or blankets that are too big for your bed. They should not hang down onto the floor. Have a firm chair that has side arms. You can use this for support while you get dressed. Do not have throw rugs and other things on the floor that can make you trip. What can I do in the kitchen? Clean up any spills right away. Avoid walking on wet floors. Keep items that you use a lot in easy-to-reach places. If you need to reach something above you, use a strong step stool that has a grab bar. Keep electrical cords out of the way. Do not use floor polish or wax that makes floors slippery. If you must use wax, use non-skid floor wax. Do not have throw rugs and other things on the floor that can make you trip. What can I do with my stairs? Do not leave any items on the stairs. Make sure that there are handrails on both sides of the stairs and use them. Fix handrails that are broken or loose. Make sure that handrails are as long as the stairways. Check any carpeting to make sure that it is firmly attached to the stairs. Fix any carpet that is loose or worn. Avoid having throw rugs at the  top or bottom of the stairs. If you do have throw rugs, attach them to the floor with carpet tape. Make sure that you have a light switch at the top of the stairs and the bottom of the stairs. If you do not have them, ask someone to add them for you. What else can I do to help prevent falls? Wear shoes that: Do not have high heels. Have rubber bottoms. Are comfortable and fit you well. Are closed at the toe. Do not wear sandals. If you use a stepladder: Make sure that it is fully opened. Do not climb a closed stepladder. Make sure that both sides of the stepladder are locked into place. Ask someone to hold it for you, if possible. Clearly mark and make sure that you can see: Any grab bars or handrails. First and last steps. Where the edge of each step is. Use tools that help you move around (mobility aids) if they are needed. These include: Canes. Walkers. Scooters. Crutches. Turn on the lights when you go into a dark area. Replace any light bulbs as soon as they burn out. Set up your furniture so you have a clear path. Avoid moving your furniture around. If any of your floors are uneven, fix them. If there are any pets around you, be aware of where they are. Review your medicines with your doctor. Some medicines can make you feel dizzy. This can increase your chance of falling. Ask your doctor what other things that you can do to help prevent falls. This information is not intended to replace advice given to you by your health care provider. Make sure you discuss any questions you have with your health care provider. Document Released: 07/07/2009 Document Revised: 02/16/2016 Document Reviewed: 10/15/2014 Elsevier Interactive Patient Education  2017 [employer].

## 2022-06-25 NOTE — Progress Notes (Signed)
Subjective:   [name] is a 81 y.o. female who presents for Medicare Annual (Subsequent) preventive examination.  Review of Systems      Cardiac Risk Factors include: advanced age (>51mn, >>87women);hypertension     Objective:    Today's Vitals   06/25/22 1056  BP: 122/62  Pulse: 67  Temp: 98.1 F (36.7 C)  TempSrc: Oral  SpO2: 95%  Weight: 130 lb (59 kg)  Height: '5\' 1"'$  (1.549 m)   Body mass index is 24.56 kg/m.     06/25/2022   11:10 AM 01/15/2022   11:17 AM 06/22/2021   11:51 AM 09/14/2020    3:29 PM 06/13/2020   10:52 AM 12/30/2017   11:56 AM 04/26/2017   12:16 PM  Advanced Directives  Does Patient Have a Medical Advance Directive? Yes Yes Yes No Yes Yes Yes  Type of AParamedicof [place]utch[place] Island SouthLiving will [place]Living will [place]west CityLiving will  [place]Living will [place]oryLiving will   Does patient want to make changes to medical advance directive? No - Patient declined    No - Patient declined    Copy of [place] Chart? Yes - validated most recent copy scanned in chart (See row information) No - copy requested No - copy requested  No - copy requested No - copy requested   Would patient like information on creating a medical advance directive?    No - Patient declined       Current Medications (verified) Outpatient Encounter Medications as of 06/25/2022  Medication Sig   amLODipine (NORVASC) 5 MG tablet Take 1 tablet (5 mg total) by mouth daily.   Calcium Carb-Cholecalciferol (CALCIUM CARBONATE-VITAMIN D3 PO) Take 1 tablet by mouth daily   cholecalciferol (VITAMIN D) 1000 UNITS tablet Take 1,000 Units by mouth daily.   denosumab (PROLIA) 60 MG/ML SOLN injection Inject 60 mg into the skin every 6 (six) months.   diclofenac sodium (VOLTAREN) 1 % GEL APPLY 2 GRAMS TOPICALLY FOUR TIMES A DAY   ferrous sulfate 325 (65 FE) MG tablet Take  325 mg by mouth 2 (two) times daily.   fluticasone (FLONASE) 50 MCG/ACT nasal spray Place 2 sprays into both nostrils daily.   furosemide (LASIX) 40 MG tablet TAKE 1 TABLET DAILY   gabapentin (NEURONTIN) 300 MG capsule TAKE 2 CAPSULES TWICE A DAY (KEEP APPOINTMENT SCHEDULED)   Golimumab (SIMPONI ARIA IV) Inject into the vein. Takes infusion every 8 wks.   Multiple Vitamins-Minerals (MULTIVITAMIN,TX-MINERALS) tablet Take 1 tablet by mouth daily.   oxyCODONE (OXYCONTIN) 10 mg 12 hr tablet Take 1 tablet (10 mg total) by mouth every 12 (twelve) hours.   oxyCODONE (OXYCONTIN) 10 mg 12 hr tablet Take 1 tablet (10 mg total) by mouth every 12 (twelve) hours.   potassium chloride (KLOR-CON M) 10 MEQ tablet TAKE 1 TABLET TWICE A DAY   predniSONE (DELTASONE) 1 MG tablet TAKE 4 TABLETS DAILY WITH BREAKFAST   [DISCONTINUED] potassium chloride (K-DUR) 10 MEQ tablet Take 1 tablet (10 mEq total) by mouth 2 (two) times daily.   No facility-administered encounter medications on file as of 06/25/2022.    Allergies (verified) Hydromorphone   History: Past Medical History:  Diagnosis Date   Allergic rhinitis    Anemia    Anxiety    Bronchiectasis    Carpal tunnel syndrome 07/13/2015   Bilateral   CVA (cerebral infarction) 10/2007   Right thalamic  Diverticulosis of colon    Gait disorder    GERD (gastroesophageal reflux disease)    History of breast cancer    HTN (hypertension)    Hyperlipidemia    Idiopathic pulmonary fibrosis    Left knee DJD    Migraine    Osteoporosis    Peripheral neuropathy    PMR (polymyalgia rheumatica) (HCC)    Polyneuropathy in other diseases classified elsewhere ([place]) 02/17/2013   Previous back surgery 10/09/12   Renal insufficiency    Rheumatoid arthritis (HCC)    Seizure disorder (HCC)    Spondylosis, cervical, with myelopathy 08/31/2015   C3-4 myelopathy   Temporal arteritis (HCC)    Right eye blind, on steroids per [facility]er[facility]/ Dr [name]   Type II or unspecified  type diabetes mellitus without mention of complication, not stated as uncontrolled    2nd to steriods   Vitamin D deficiency    Past Surgical History:  Procedure Laterality Date   APPENDECTOMY  01/2021   CARPAL TUNNEL RELEASE Right    CATARACT EXTRACTION     OS - Summer 2010   CERVICAL FUSION  09/24/2008   4 rods and pins in place   CHOLECYSTECTOMY     COLONOSCOPY  12/15/2011   Procedure: COLONOSCOPY;  Surgeon: [name], MD;  Location: [facility];  Service: Endoscopy;  Laterality: N/A;   LUMBAR FUSION  04/24/2010   W/Mechanical fixation - [facility]   LUMBAR FUSION  09/25/2011   rods in hips (to stabilize).   MASTECTOMY     NECK SURGERY     rheumatoid nodule removal     SPINAL FUSION  07/25/2010   T10-L2 interbody fusion / [facility]   TONSILLECTOMY     TOTAL KNEE ARTHROPLASTY     Left   Family History  Problem Relation Age of Onset   Brain cancer Mother    Hypertension Mother    Arthritis Mother    Dementia Father    Hypertension Father    Arthritis Father    Breast cancer Sister    Lung cancer Sister    Lung disease Neg Hx    Rheumatologic disease Neg Hx    Social History   Socioeconomic History   Marital status: Widowed    Spouse name: Not on file   Number of children: 1   Years of education: 12+ coll.   Highest education level: Not on file  Occupational History   Occupation: Education officer, environmental: RETIRED    Comment: retired  Tobacco Use   Smoking status: Never   Smokeless tobacco: Never   Tobacco comments:    Father   Vaping Use   Vaping Use: Never used  Substance and Sexual Activity   Alcohol use: No    Alcohol/week: 0.0 standard drinks of alcohol    Comment: heavy drinker until 1995 - sobriety with AA   Drug use: No   Sexual activity: Not Currently  Other Topics Concern   Not on file  Social History Narrative   07/03/21 friend [name] living with her   Right handed   Drinks 6-8  cups caffeine daily      Social Determinants of Health   Financial Resource Strain: Low Risk  (06/25/2022)   Overall Financial Resource Strain (CARDIA)    Difficulty of Paying Living Expenses: Not hard at all  Food Insecurity: No Food Insecurity (06/25/2022)   Hunger Vital Sign    Worried About Running Out of Food  in the Last Year: Never true    [place] in the Last Year: Never true  Transportation Needs: No Transportation Needs (06/22/2021)   PRAPARE - Hydrologist (Medical): No    Lack of Transportation (Non-Medical): No  Physical Activity: Inactive (06/25/2022)   Exercise Vital Sign    Days of Exercise per Week: 0 days    Minutes of Exercise per Session: 0 min  Stress: No Stress Concern Present (06/25/2022)   [place]    Feeling of Stress : Not at all  Social Connections: Moderately Integrated (06/25/2022)   Social Connection and Isolation Panel [NHANES]    Frequency of Communication with Friends and Family: More than three times a week    Frequency of Social Gatherings with Friends and Family: More than three times a week    Attends Religious Services: More than 4 times per year    Active Member of [employer] or Organizations: Yes    Attends Archivist Meetings: More than 4 times per year    Marital Status: Widowed    Tobacco Counseling Counseling given: Not Answered Tobacco comments: Father    Clinical Intake:  Pre-visit preparation completed: No  Pain : No/denies pain     BMI - recorded: 24.56 Nutritional Status: BMI of 19-24  Normal Nutritional Risks: None Diabetes: No  How often do you need to have someone help you when you read instructions, pamphlets, or other written materials from your doctor or pharmacy?: 1 - Never  Diabetic?  No  Interpreter Needed?: No  Comments: Rolene Arbour LPN   Activities of Daily Living    06/25/2022   11:05 AM  In  your present state of health, do you have any difficulty performing the following activities:  Hearing? 0  Vision? 0  Difficulty concentrating or making decisions? 0  Walking or climbing stairs? 0  Dressing or bathing? 0  Doing errands, shopping? 0  Preparing Food and eating ? N  Using the Toilet? N  In the past six months, have you accidently leaked urine? Y  Comment Wears pad and breif. Followed by Urologist  Do you have problems with loss of bowel control? N  Managing your Medications? N  Managing your Finances? N  Housekeeping or managing your Housekeeping? N    Patient Care Team: Eulas Post, MD as PCP - General (Family Medicine) Kathrynn Ducking, MD (Inactive) (Neurology) Hennie Duos, MD as Consulting Physician (Rheumatology) Lavonna Monarch, MD (Inactive) as Consulting Physician (Dermatology)  Indicate any recent Medical Services you may have received from other than [facility] providers in the past year (date may be approximate).     Assessment:   This is a routine wellness examination for [place].  Hearing/Vision screen Hearing Screening - Comments:: Denies hearing difficulties   Vision Screening - Comments:: Wears reading glasses - up to date with routine eye exams with  Dr Delman Cheadle  Dietary issues and exercise activities discussed: Current Exercise Habits: The patient does not participate in regular exercise at present, Exercise limited by: None identified   Goals Addressed               This Visit's Progress     Eat more fiber (pt-stated)         Depression Screen    06/25/2022   11:03 AM 01/15/2022   11:17 AM 10/27/2021   10:03 AM 06/22/2021   11:52 AM 06/22/2021  11:50 AM 06/13/2020   10:59 AM 04/12/2020    1:01 PM  PHQ 2/9 Scores  PHQ - 2 Score 0 0 0 0 0 0 0  PHQ- 9 Score      0     Fall Risk    06/25/2022   11:07 AM 01/24/2022   11:34 AM 01/15/2022   11:12 AM 10/27/2021   10:04 AM 06/22/2021   11:49 AM  Fall Risk   Falls in the past year? 1  0 1 1 0  Number falls in past yr: 1 0 1 0 0  Injury with Fall? 0 0 0 0 0  Comment No injury or medical attention needed      Risk for fall due to : Other (Comment) No Fall Risks History of fall(s);Impaired mobility;Impaired balance/gait  Impaired balance/gait;Impaired mobility  Risk for fall due to: Comment Patient stated loses balance at times. Uses walker    cane walker wheelchair  Follow up Falls prevention discussed Falls evaluation completed Falls prevention discussed;Education provided  Falls evaluation completed    FALL RISK PREVENTION PERTAINING TO THE HOME:  Any stairs in or around the home? Yes  If so, are there any without handrails? No  Home free of loose throw rugs in walkways, pet beds, electrical cords, etc? Yes  Adequate lighting in your home to reduce risk of falls? Yes   ASSISTIVE DEVICES UTILIZED TO PREVENT FALLS:  Life alert? No  Use of a cane, walker or w/c? Yes  Grab bars in the bathroom? Yes  Shower chair or bench in shower? Yes  Elevated toilet seat or a handicapped toilet? Yes   TIMED UP AND GO:  Was the test performed? Yes .  Length of time to ambulate 10 feet: 10 sec.   Gait slow and steady with assistive device  Cognitive Function:        06/25/2022   11:10 AM 06/13/2020   11:01 AM  6CIT Screen  What Year? 0 points 0 points  What month? 0 points 0 points  What time? 0 points 0 points  Count back from 20 0 points 0 points  Months in reverse 0 points 0 points  Repeat phrase 2 points 0 points  Total Score 2 points 0 points    Immunizations Immunization History  Administered Date(s) Administered   Fluad Quad(high Dose 65+) 06/30/2019, 07/13/2020, 06/12/2021   H1N1 10/19/2008   Influenza Split 07/20/2012   Influenza Whole 06/28/2006, 07/08/2007, 07/07/2008, 07/07/2009   Influenza, High Dose Seasonal PF 07/14/2013, 07/30/2017, 07/02/2018   Influenza,inj,Quad PF,6+ Mos 07/07/2014   Influenza-Unspecified 07/15/2015, 07/24/2016    PFIZER(Purple Top)SARS-COV-2 Vaccination 11/07/2019, 11/29/2019   Pneumococcal Conjugate-13 07/28/2013   Pneumococcal Polysaccharide-23 06/28/2006   Td 10/28/2003   Tdap 02/27/2020   Zoster, Live 07/07/2008    TDAP status: Up to date  Flu Vaccine status: Due, Education has been provided regarding the importance of this vaccine. Advised may receive this vaccine at local pharmacy or Health Dept. Aware to provide a copy of the vaccination record if obtained from local pharmacy or Health Dept. Verbalized acceptance and understanding.  Pneumococcal vaccine status: Up to date  Covid-19 vaccine status: Completed vaccines  Qualifies for Shingles Vaccine? Yes   Zostavax completed No   Shingrix Completed?: No.    Education has been provided regarding the importance of this vaccine. Patient has been advised to call insurance company to determine out of pocket expense if they have not yet received this vaccine. Advised may also receive vaccine at  local pharmacy or Health Dept. Verbalized acceptance and understanding.  Screening Tests Health Maintenance  Topic Date Due   OPHTHALMOLOGY EXAM  Never done   FOOT EXAM  04/26/2018   HEMOGLOBIN A1C  04/29/2018   COVID-19 Vaccine (3 - Pfizer risk series) 07/11/2022 (Originally 12/27/2019)   Zoster Vaccines- Shingrix (1 of 2) 09/25/2022 (Originally 05/05/1960)   INFLUENZA VACCINE  12/23/2022 (Originally 04/24/2022)   Diabetic kidney evaluation - Urine ACR  09/01/2022   MAMMOGRAM  10/23/2022   Diabetic kidney evaluation - GFR measurement  05/17/2023   TETANUS/TDAP  02/26/2030   Pneumonia Vaccine 9+ Years old  Completed   DEXA SCAN  Completed   HPV VACCINES  Aged Out    Health Maintenance  Health Maintenance Due  Topic Date Due   OPHTHALMOLOGY EXAM  Never done   FOOT EXAM  04/26/2018   HEMOGLOBIN A1C  04/29/2018    Colorectal cancer screening: No longer required.   Mammogram status: Completed 10/23/21. Repeat every year  Bone Density status:  Completed 07/02/19. Results reflect: Bone density results: OSTEOPOROSIS. Repeat every   years.  Lung Cancer Screening: (Low Dose CT Chest recommended if Age 72-80 years, 30 pack-year currently smoking OR have quit w/in 15years.) does not qualify.     Additional Screening:  Hepatitis C Screening: does not qualify; Completed   Vision Screening: Recommended annual ophthalmology exams for early detection of glaucoma and other disorders of the eye. Is the patient up to date with their annual eye exam?  Yes  Who is the provider or what is the name of the office in which the patient attends annual eye exams? Dr Delman Cheadle If pt is not established with a provider, would they like to be referred to a provider to establish care? No .   Dental Screening: Recommended annual dental exams for proper oral hygiene  Community Resource Referral / Chronic Care Management:  CRR required this visit?  No   CCM required this visit?  No      Plan:     I have personally reviewed and noted the following in the patient's chart:   Medical and social history Use of alcohol, tobacco or illicit drugs  Current medications and supplements including opioid prescriptions. Patient is currently taking opioid prescriptions. Information provided to patient regarding non-opioid alternatives. Patient advised to discuss non-opioid treatment plan with their provider. Functional ability and status Nutritional status Physical activity Advanced directives List of other physicians Hospitalizations, surgeries, and ER visits in previous 12 months Vitals Screenings to include cognitive, depression, and falls Referrals and appointments  In addition, I have reviewed and discussed with patient certain preventive protocols, quality metrics, and best practice recommendations. A written personalized care plan for preventive services as well as general preventive health recommendations were provided to patient.     Criselda Peaches, LPN   01/24/9766   Nurse Notes: Patient due labs Hemoglobin A1c . Also patient request refills on medication oxyCODONE.

## 2022-06-26 ENCOUNTER — Ambulatory Visit (INDEPENDENT_AMBULATORY_CARE_PROVIDER_SITE_OTHER): Payer: Medicare Other | Admitting: Family Medicine

## 2022-06-26 ENCOUNTER — Encounter: Payer: Self-pay | Admitting: Family Medicine

## 2022-06-26 VITALS — BP 120/60 | HR 61 | Temp 98.2°F | Ht 61.0 in | Wt 131.3 lb

## 2022-06-26 DIAGNOSIS — K5903 Drug induced constipation: Secondary | ICD-10-CM | POA: Diagnosis not present

## 2022-06-26 DIAGNOSIS — G8929 Other chronic pain: Secondary | ICD-10-CM

## 2022-06-26 DIAGNOSIS — I10 Essential (primary) hypertension: Secondary | ICD-10-CM

## 2022-06-26 DIAGNOSIS — M545 Low back pain, unspecified: Secondary | ICD-10-CM

## 2022-06-26 DIAGNOSIS — Z23 Encounter for immunization: Secondary | ICD-10-CM

## 2022-06-26 MED ORDER — OXYCODONE HCL ER 10 MG PO T12A: 10.0000 mg | EXTENDED_RELEASE_TABLET | Freq: Two times a day (BID) | ORAL | 0 refills | Status: DC

## 2022-06-26 NOTE — Progress Notes (Signed)
Established Patient Office Visit  Subjective   Patient ID: [name], female    DOB: Oct 29, 1940  Age: 81 y.o. MRN: [id number]  Chief Complaint  Patient presents with   Medication Consultation    HPI   [name] is here to discuss chronic pain management.  She has been on regimen of OxyContin 10 mg twice daily for many years.  This continues to work well for her.  She had a situation that came up in September where she brought her medication home in the bag that her medication was in she was using as a trash bag and ended up throwing out her prescription.  She has not had any regular pattern of medication misuse and we made an exception and went ahead and sent in 1 month refill locally.  She normally gets her prescriptions through [facility].  She has enough to take her through October 6 but will need refills at this time through [facility] not to lapse in her coverage.  OxyContin continues to work fairly well for her.  She does have some chronic mild intermittent constipation.  She has upped her fiber intake but is not taking any stool softeners.  She is hypertension treated with amlodipine 5 mg daily.  She also takes furosemide 40 mg daily and K-Lor.  Recent electrolytes stable.  No recent dizziness.  No chest pains.  Indication for chronic opioid: Chronic neck and back pain Medication and dose: OxyContin 10 mg twice daily # pills per month: 60 Last UDS date: 5/23 Opioid Treatment Agreement signed (Y/N): yes Opioid Treatment Agreement last reviewed with patient: today  NCCSRS reviewed this encounter (include red flags):none     Past Medical History:  Diagnosis Date   Allergic rhinitis    Anemia    Anxiety    Bronchiectasis    Carpal tunnel syndrome 07/13/2015   Bilateral   CVA (cerebral infarction) 10/2007   Right thalamic    Diverticulosis of colon    Gait disorder    GERD (gastroesophageal reflux disease)    History of breast cancer    HTN (hypertension)     Hyperlipidemia    Idiopathic pulmonary fibrosis    Left knee DJD    Migraine    Osteoporosis    Peripheral neuropathy    PMR (polymyalgia rheumatica) (HCC)    Polyneuropathy in other diseases classified elsewhere ([place]) 02/17/2013   Previous back surgery 10/09/12   Renal insufficiency    Rheumatoid arthritis (HCC)    Seizure disorder (HCC)    Spondylosis, cervical, with myelopathy 08/31/2015   C3-4 myelopathy   Temporal arteritis (HCC)    Right eye blind, on steroids per [facility]er[facility]/ Dr [name]   Type II or unspecified type diabetes mellitus without mention of complication, not stated as uncontrolled    2nd to steriods   Vitamin D deficiency    Past Surgical History:  Procedure Laterality Date   APPENDECTOMY  01/2021   CARPAL TUNNEL RELEASE Right    CATARACT EXTRACTION     OS - Summer 2010   CERVICAL FUSION  09/24/2008   4 rods and pins in place   CHOLECYSTECTOMY     COLONOSCOPY  12/15/2011   Procedure: COLONOSCOPY;  Surgeon: [name], MD;  [facility];  Service: Endoscopy;  Laterality: N/A;   LUMBAR FUSION  04/24/2010   W/Mechanical fixation - [facility]   LUMBAR FUSION  09/25/2011   rods in hips (to stabilize).   MASTECTOMY  NECK SURGERY     rheumatoid nodule removal     SPINAL FUSION  07/25/2010   T10-L2 interbody fusion / [place]     Left    reports that she has never smoked. She has never used smokeless tobacco. She reports that she does not drink alcohol and does not use drugs. family history includes Arthritis in her father and mother; Brain cancer in her mother; Breast cancer in her sister; Dementia in her father; Hypertension in her father and mother; Lung cancer in her sister. Allergies  Allergen Reactions   Hydromorphone Other (See Comments)    Cognitive changes  "made me unconscious"     Review of Systems  Constitutional:  Negative for malaise/fatigue.  Eyes:  Negative for  blurred vision.  Respiratory:  Negative for shortness of breath.   Cardiovascular:  Negative for chest pain.  Gastrointestinal:  Positive for constipation.  Genitourinary:  Negative for dysuria.  Musculoskeletal:  Positive for back pain.  Neurological:  Negative for dizziness, weakness and headaches.      Objective:     BP 120/60 (BP Location: Left Arm, Patient Position: Sitting, Cuff Size: Normal)   Pulse 61   Temp 98.2 F (36.8 C) (Oral)   Ht '5\' 1"'$  (1.549 m)   Wt 131 lb 4.8 oz (59.6 kg)   SpO2 98%   BMI 24.81 kg/m    Physical Exam Vitals reviewed.  Constitutional:      Appearance: She is well-developed.  Eyes:     Pupils: Pupils are equal, round, and reactive to light.  Neck:     Thyroid: No thyromegaly.     Vascular: No JVD.  Cardiovascular:     Rate and Rhythm: Normal rate and regular rhythm.     Heart sounds:     No gallop.  Pulmonary:     Effort: Pulmonary effort is normal. No respiratory distress.     Breath sounds: Normal breath sounds. No wheezing or rales.  Musculoskeletal:     Cervical back: Neck supple.     Right lower leg: No edema.     Left lower leg: No edema.  Neurological:     Mental Status: She is alert.      No results found for any visits on 06/26/22.    The ASCVD Risk score (Arnett DK, et al., 2019) failed to calculate for the following reasons:   The 2019 ASCVD risk score is only valid for ages 37 to 78    Assessment & Plan:   #1 chronic neck and low back pain.  She has history of extensive scoliosis with prior surgeries.  Pain currently stable on OxyContin 10 mg twice daily.  Refill sent for 3 months  #2 constipation related to chronic opioid use.  Increase fluid and fiber intake.  Suggest that she try over-the-counter stool softener such as Colace to daily.  Consider MiraLAX as needed  #3 hypertension stable and at goal.  Continue amlodipine 5 mg daily  Flu vaccine given Routine follow-up in 3 months and sooner as  needed   Return in about 3 months (around 09/26/2022).    Carolann Littler, MD

## 2022-06-29 ENCOUNTER — Other Ambulatory Visit: Payer: Self-pay | Admitting: Family Medicine

## 2022-07-04 ENCOUNTER — Telehealth: Payer: Self-pay | Admitting: Family Medicine

## 2022-07-04 DIAGNOSIS — M0589 Other rheumatoid arthritis with rheumatoid factor of multiple sites: Secondary | ICD-10-CM | POA: Diagnosis not present

## 2022-07-04 NOTE — Telephone Encounter (Signed)
[facility] is [facility] told her it will be wed or Thursday next wk before her oxycontin arrives. Pt said [facility] with [facility] and was told to ask for new rx oxycodone not (OXYCONTIN because it will be rejected please would like 10 days supply sent to  [facility], [address] Phone:  [phone]  Fax:  [phone]

## 2022-07-05 ENCOUNTER — Telehealth: Payer: Self-pay | Admitting: Family Medicine

## 2022-07-05 MED ORDER — OXYCODONE HCL ER 10 MG PO T12A: 10.0000 mg | EXTENDED_RELEASE_TABLET | Freq: Two times a day (BID) | ORAL | 0 refills | Status: DC

## 2022-07-05 NOTE — Telephone Encounter (Signed)
I sent in 10 day refill to local pharmacy and this means next fills will need to be written around 10-06-21.     Eulas Post MD [facility]

## 2022-07-05 NOTE — Telephone Encounter (Signed)
Noted and patient aware. 

## 2022-07-05 NOTE — Telephone Encounter (Signed)
Pharmacy updated.

## 2022-07-05 NOTE — Telephone Encounter (Signed)
Pt called and stated her current [facility] is not carrying the Rx oxyCODONE (OXYCONTIN) 10 mg 12 hr tablet and would like it to be sent to   [facility] [address], [place], [place] [address]   [phone]  Pt stated she has been out of this med and her back is really hurting she is expecting to have this filled today. Pt is aware Dr is out of the office.  Please advise

## 2022-07-06 MED ORDER — OXYCODONE HCL ER 10 MG PO T12A: 10.0000 mg | EXTENDED_RELEASE_TABLET | Freq: Two times a day (BID) | ORAL | 0 refills | Status: DC

## 2022-07-06 NOTE — Addendum Note (Signed)
Addended by: Eulas Post on: 07/06/2022 01:12 PM   Modules accepted: Orders

## 2022-07-06 NOTE — Telephone Encounter (Signed)
I was out of the office yesterday afternoon, so I will send this back to Dr. Elease Hashimoto

## 2022-07-06 NOTE — Telephone Encounter (Signed)
I have sent this again.    [name] W [name] Saad MD [facility]  

## 2022-07-06 NOTE — Telephone Encounter (Signed)
Noted  

## 2022-07-09 ENCOUNTER — Emergency Department (HOSPITAL_BASED_OUTPATIENT_CLINIC_OR_DEPARTMENT_OTHER): Payer: Medicare Other | Admitting: Radiology

## 2022-07-09 ENCOUNTER — Encounter (HOSPITAL_BASED_OUTPATIENT_CLINIC_OR_DEPARTMENT_OTHER): Payer: Self-pay

## 2022-07-09 ENCOUNTER — Other Ambulatory Visit: Payer: Self-pay

## 2022-07-09 ENCOUNTER — Emergency Department (HOSPITAL_BASED_OUTPATIENT_CLINIC_OR_DEPARTMENT_OTHER)
Admission: EM | Admit: 2022-07-09 | Discharge: 2022-07-10 | Disposition: A | Discharge status: Home / Self Care | Payer: Medicare Other | Attending: Emergency Medicine | Admitting: Emergency Medicine

## 2022-07-09 DIAGNOSIS — Z79899 Other long term (current) drug therapy: Secondary | ICD-10-CM | POA: Insufficient documentation

## 2022-07-09 DIAGNOSIS — S8992XA Unspecified injury of left lower leg, initial encounter: Secondary | ICD-10-CM | POA: Diagnosis present

## 2022-07-09 DIAGNOSIS — W1839XA Other fall on same level, initial encounter: Secondary | ICD-10-CM | POA: Diagnosis not present

## 2022-07-09 DIAGNOSIS — I10 Essential (primary) hypertension: Secondary | ICD-10-CM | POA: Diagnosis not present

## 2022-07-09 DIAGNOSIS — S8012XA Contusion of left lower leg, initial encounter: Secondary | ICD-10-CM | POA: Insufficient documentation

## 2022-07-09 NOTE — ED Triage Notes (Signed)
Patient here POV from Home.  Endorses Fall sustained approximately 5-6 hours ago. She was lifting a [name] out of the car when she lost her balance and fell.    No LOC. No Head Injury. No Anticoagulants. Main complaints is to Left Lower Leg which has severe bruising and to Left Knee.   NAD noted during Triage. A&Ox4. GCS 15. BIB Wheelchair.

## 2022-07-10 NOTE — ED Provider Notes (Signed)
[facility] EMERGENCY DEPT Provider Note   CSN: [id number] Arrival date & time: 07/09/22  2145     History  Chief Complaint  Patient presents with   [name] is a 81 y.o. female.  Patient is an 81 year old female with past medical history of hyperlipidemia, polyneuropathy, hypertension, pulmonary fibrosis, rheumatoid arthritis.  Patient presenting today for evaluation of fall.  She reports getting her walker out of the trunk of the car, then lost her balance and fell on the ground.  She reports bruising to the left lower leg and pain in her left hip/pelvis.  She required assistance to get up off the ground, but has been able to ambulate.  She has had increasing bruising and swelling to the left lower leg since.  The history is provided by the patient.       Home Medications Prior to Admission medications   Medication Sig Start Date End Date Taking? Authorizing Provider  amLODipine (NORVASC) 5 MG tablet Take 1 tablet (5 mg total) by mouth daily. 11/03/21   Burchette, Alinda Sierras, MD  Calcium Carb-Cholecalciferol (CALCIUM CARBONATE-VITAMIN D3 PO) Take 1 tablet by mouth daily    [provider]  cholecalciferol (VITAMIN D) 1000 UNITS tablet Take 1,000 Units by mouth daily.    [provider]  denosumab (PROLIA) 60 MG/ML SOLN injection Inject 60 mg into the skin every 6 (six) months.    [provider]  diclofenac sodium (VOLTAREN) 1 % GEL APPLY 2 GRAMS TOPICALLY FOUR TIMES A DAY 05/12/19   Kathrynn Ducking, MD  ferrous sulfate 325 (65 FE) MG tablet Take 325 mg by mouth 2 (two) times daily.    [provider]  fluticasone (FLONASE) 50 MCG/ACT nasal spray Place 2 sprays into both nostrils daily. 06/19/21   Rozetta Nunnery, MD  furosemide (LASIX) 40 MG tablet TAKE 1 TABLET DAILY 09/19/21   Burchette, Alinda Sierras, MD  gabapentin (NEURONTIN) 300 MG capsule Take 1 capsule (300 mg total) by mouth 2 (two) times daily. 06/29/22    Burchette, Alinda Sierras, MD  Golimumab ([place] ARIA IV) Inject into the vein. Takes infusion every 8 wks.    [provider]  Multiple Vitamins-Minerals (MULTIVITAMIN,TX-MINERALS) tablet Take 1 tablet by mouth daily.    [provider]  oxyCODONE (OXYCONTIN) 10 mg 12 hr tablet Take 1 tablet (10 mg total) by mouth every 12 (twelve) hours. 06/26/22   Burchette, Alinda Sierras, MD  oxyCODONE (OXYCONTIN) 10 mg 12 hr tablet Take 1 tablet (10 mg total) by mouth every 12 (twelve) hours. 06/26/22   Burchette, Alinda Sierras, MD  oxyCODONE (OXYCONTIN) 10 mg 12 hr tablet Take 1 tablet (10 mg total) by mouth every 12 (twelve) hours. 07/06/22   Burchette, Alinda Sierras, MD  potassium chloride (KLOR-CON M) 10 MEQ tablet TAKE 1 TABLET TWICE A DAY 06/04/22   Burchette, Alinda Sierras, MD  predniSONE (DELTASONE) 1 MG tablet TAKE 4 TABLETS DAILY WITH BREAKFAST 01/23/22   Suzzanne Cloud, NP  potassium chloride (K-DUR) 10 MEQ tablet Take 1 tablet (10 mEq total) by mouth 2 (two) times daily. 12/29/12 07/13/20  Norins, Heinz Knuckles, MD      Allergies    Hydromorphone    Review of Systems   Review of Systems  All other systems reviewed and are negative.   Physical Exam Updated Vital Signs BP (!) 164/92   Pulse 80   Temp 99.1 F (37.3 C) (Temporal)   Resp 18  Ht '5\' 1"'$  (1.549 m)   Wt 59.6 kg   SpO2 98%   BMI 24.83 kg/m  Physical Exam Vitals and nursing note reviewed.  Constitutional:      Appearance: Normal appearance.  Pulmonary:     Effort: Pulmonary effort is normal.  Musculoskeletal:     Comments: The left lower extremity is noted to have a large hematoma to the anterior tibial region.  It is tender to the touch.  DP pulses are palpable and motor and sensation are intact throughout the entire foot.  The posterior compartment of the leg is soft.  Skin:    General: Skin is warm and dry.  Neurological:     Mental Status: She is alert.     ED Results / Procedures / Treatments   Labs (all labs ordered are listed,  but only abnormal results are displayed) Labs Reviewed - No data to display  EKG None  Radiology DG Pelvis 1-2 Views  Result Date: 07/09/2022 CLINICAL DATA:  Left-sided buttock pain after fall EXAM: PELVIS - 1-2 VIEW COMPARISON:  CT abdomen and pelvis 12/23/2020 FINDINGS: No definite acute fracture. No dislocation. Chronic posttraumatic deformities about the right superior and inferior pubic rami. Lumbosacral fusion hardware. Dystrophic soft tissue calcification about the right iliac wing. IMPRESSION: No acute fracture or dislocation. Electronically Signed   By: Placido Sou M.D.   On: 07/09/2022 22:41   DG Knee Complete 4 Views Left  Result Date: 07/09/2022 CLINICAL DATA:  Status post fall EXAM: LEFT KNEE - COMPLETE 4+ VIEW; LEFT TIBIA AND FIBULA - 2 VIEW COMPARISON:  None Available. FINDINGS: Left TKA. No radiographic evidence of loosening. No evidence of fracture, dislocation, or joint effusion. No evidence of arthropathy or other focal bone abnormality. Heterotopic ossification about the fibular head. IMPRESSION: No acute fracture or dislocation. Electronically Signed   By: Placido Sou M.D.   On: 07/09/2022 22:38   DG Tibia/Fibula Left  Result Date: 07/09/2022 CLINICAL DATA:  Status post fall EXAM: LEFT KNEE - COMPLETE 4+ VIEW; LEFT TIBIA AND FIBULA - 2 VIEW COMPARISON:  None Available. FINDINGS: Left TKA. No radiographic evidence of loosening. No evidence of fracture, dislocation, or joint effusion. No evidence of arthropathy or other focal bone abnormality. Heterotopic ossification about the fibular head. IMPRESSION: No acute fracture or dislocation. Electronically Signed   By: Placido Sou M.D.   On: 07/09/2022 22:38    Procedures Procedures    Medications Ordered in ED Medications - No data to display  ED Course/ Medical Decision Making/ A&P  Patient presents here with complaints of a fall as described in the HPI.  She has a large hematoma noted to the anterior tibial  region.  There is no external bleeding.  The leg is neurovascularly intact.  At this point, I do not feel as though any intervention is indicated.  The foot is well-perfused and is neurovascularly intact.  There is no evidence for compartment syndrome.  Patient to be treated with rest, elevation, and return as needed.  Final Clinical Impression(s) / ED Diagnoses Final diagnoses:  None    Rx / DC Orders ED Discharge Orders     None         Veryl Speak, MD 07/10/22 [id number]

## 2022-07-10 NOTE — Discharge Instructions (Signed)
Keep your leg elevated is much as possible for the next several days.  Ice for the next 24 hours to help with swelling, then heating pad afterwards to help dissipate the hematoma.  Return to the emergency department if you develop worsening swelling, numbness or pain to the foot, high fever, increasing pain, or for other new and concerning symptoms.

## 2022-07-13 ENCOUNTER — Other Ambulatory Visit: Payer: Self-pay

## 2022-07-13 ENCOUNTER — Emergency Department (HOSPITAL_BASED_OUTPATIENT_CLINIC_OR_DEPARTMENT_OTHER)
Admission: EM | Admit: 2022-07-13 | Discharge: 2022-07-13 | Disposition: A | Discharge status: Home / Self Care | Payer: Medicare Other | Attending: Emergency Medicine | Admitting: Emergency Medicine

## 2022-07-13 ENCOUNTER — Encounter (HOSPITAL_BASED_OUTPATIENT_CLINIC_OR_DEPARTMENT_OTHER): Payer: Self-pay | Admitting: Emergency Medicine

## 2022-07-13 DIAGNOSIS — W19XXXS Unspecified fall, sequela: Secondary | ICD-10-CM

## 2022-07-13 DIAGNOSIS — I10 Essential (primary) hypertension: Secondary | ICD-10-CM | POA: Insufficient documentation

## 2022-07-13 DIAGNOSIS — T148XXA Other injury of unspecified body region, initial encounter: Secondary | ICD-10-CM

## 2022-07-13 DIAGNOSIS — S8012XD Contusion of left lower leg, subsequent encounter: Secondary | ICD-10-CM | POA: Diagnosis not present

## 2022-07-13 DIAGNOSIS — S8012XS Contusion of left lower leg, sequela: Secondary | ICD-10-CM | POA: Insufficient documentation

## 2022-07-13 DIAGNOSIS — W010XXS Fall on same level from slipping, tripping and stumbling without subsequent striking against object, sequela: Secondary | ICD-10-CM | POA: Insufficient documentation

## 2022-07-13 DIAGNOSIS — Z79899 Other long term (current) drug therapy: Secondary | ICD-10-CM | POA: Diagnosis not present

## 2022-07-13 DIAGNOSIS — S8992XS Unspecified injury of left lower leg, sequela: Secondary | ICD-10-CM | POA: Diagnosis present

## 2022-07-13 NOTE — Discharge Instructions (Addendum)
You were seen today for reevaluation of a hematoma on the left lower leg.  There are no signs of infection at this time.  Recommend warm compresses throughout the day.  If you choose to use a heating pad please be sure to use the lowest setting to avoid burning your skin.  You may use the provided Ace bandage for light compression if you find this tolerable.  This should resolve on its own.  If you continue have issues you may follow-up with orthopedics if needed.

## 2022-07-13 NOTE — ED Provider Notes (Signed)
[facility] EMERGENCY DEPT Provider Note   CSN: [id number] Arrival date & time: 07/13/22  1137     History  Chief Complaint  Patient presents with   Hematoma    [name] is a 81 y.o. female.  Patient presents to the emergency department requesting reevaluation of hematoma.  Patient states that on Monday she slipped and fell when her rollator slid out from under her.  She was evaluated in the emergency department at that time.  No fracture was reported but she was found to have a hematoma on the anterior tibia on the left leg.  Patient returns today because she is concerned that the hematoma has not resolved.  She denies any new injury.  Past medical history significant for hyperlipidemia, polyneuropathy, hypertension, pulmonary fibrosis, rheumatoid arthritis  HPI     Home Medications Prior to Admission medications   Medication Sig Start Date End Date Taking? Authorizing Provider  amLODipine (NORVASC) 5 MG tablet Take 1 tablet (5 mg total) by mouth daily. 11/03/21   Burchette, Alinda Sierras, MD  Calcium Carb-Cholecalciferol (CALCIUM CARBONATE-VITAMIN D3 PO) Take 1 tablet by mouth daily    [provider]  cholecalciferol (VITAMIN D) 1000 UNITS tablet Take 1,000 Units by mouth daily.    [provider]  denosumab (PROLIA) 60 MG/ML SOLN injection Inject 60 mg into the skin every 6 (six) months.    [provider]  diclofenac sodium (VOLTAREN) 1 % GEL APPLY 2 GRAMS TOPICALLY FOUR TIMES A DAY 05/12/19   Kathrynn Ducking, MD  ferrous sulfate 325 (65 FE) MG tablet Take 325 mg by mouth 2 (two) times daily.    [provider]  fluticasone (FLONASE) 50 MCG/ACT nasal spray Place 2 sprays into both nostrils daily. 06/19/21   Rozetta Nunnery, MD  furosemide (LASIX) 40 MG tablet TAKE 1 TABLET DAILY 09/19/21   Burchette, Alinda Sierras, MD  gabapentin (NEURONTIN) 300 MG capsule Take 1 capsule (300 mg total) by mouth 2 (two) times daily. 06/29/22    Burchette, Alinda Sierras, MD  Golimumab ([place] ARIA IV) Inject into the vein. Takes infusion every 8 wks.    [provider]  Multiple Vitamins-Minerals (MULTIVITAMIN,TX-MINERALS) tablet Take 1 tablet by mouth daily.    [provider]  oxyCODONE (OXYCONTIN) 10 mg 12 hr tablet Take 1 tablet (10 mg total) by mouth every 12 (twelve) hours. 06/26/22   Burchette, Alinda Sierras, MD  oxyCODONE (OXYCONTIN) 10 mg 12 hr tablet Take 1 tablet (10 mg total) by mouth every 12 (twelve) hours. 06/26/22   Burchette, Alinda Sierras, MD  oxyCODONE (OXYCONTIN) 10 mg 12 hr tablet Take 1 tablet (10 mg total) by mouth every 12 (twelve) hours. 07/06/22   Burchette, Alinda Sierras, MD  potassium chloride (KLOR-CON M) 10 MEQ tablet TAKE 1 TABLET TWICE A DAY 06/04/22   Burchette, Alinda Sierras, MD  predniSONE (DELTASONE) 1 MG tablet TAKE 4 TABLETS DAILY WITH BREAKFAST 01/23/22   Suzzanne Cloud, NP  potassium chloride (K-DUR) 10 MEQ tablet Take 1 tablet (10 mEq total) by mouth 2 (two) times daily. 12/29/12 07/13/20  Norins, Heinz Knuckles, MD      Allergies    Hydromorphone    Review of Systems   Review of Systems  Skin:        Left tibia hematoma    Physical Exam Updated Vital Signs BP 115/73 (BP Location: Left Arm)   Pulse 80   Temp 99.7 F (37.6 C) (Temporal)   Resp 16  SpO2 98%  Physical Exam HENT:     Head: Normocephalic and atraumatic.  Eyes:     Conjunctiva/sclera: Conjunctivae normal.  Cardiovascular:     Rate and Rhythm: Normal rate.  Pulmonary:     Effort: Pulmonary effort is normal. No respiratory distress.  Abdominal:     Palpations: Abdomen is soft.  Musculoskeletal:        General: No signs of injury.     Cervical back: Normal range of motion.     Comments: Left lower leg compartments soft, pedal pulse is palpable in the left foot  Skin:    General: Skin is dry.     Capillary Refill: Capillary refill takes less than 2 seconds.     Comments: See hematoma in image below.  No signs of infection at this time.   No warmth, no spreading erythema.  Neurological:     Mental Status: She is alert.  Psychiatric:        Speech: Speech normal.        Behavior: Behavior normal.      ED Results / Procedures / Treatments   Labs (all labs ordered are listed, but only abnormal results are displayed) Labs Reviewed - No data to display  EKG None  Radiology No results found.  Procedures Procedures    Medications Ordered in ED Medications - No data to display  ED Course/ Medical Decision Making/ A&P                           Medical Decision Making  Patient presents to the emergency department with a hematoma.  I reviewed past medical history including imaging from Monday of this week showing negative pelvis, knee, and tib-fib x-rays   There is no indication at this time for repeat imaging or lab work.  The patient has soft compartments.  Pedal pulse is palpable.  There is brisk capillary fill the left great toe.  I see no indication of compartment syndrome.  No indication of vascular compromise at this time.  This seems to be a hematoma.  Shown a picture of the injury for Monday and the bruising surrounding the hematoma does appear to be somewhat resolving.  Discussed hematoma care with the patient.  This includes warm compresses and light compression as needed.  I ordered the patient an Ace wrap and recommended the compresses at home.  The patient was reassured.  Patient given orthopedic follow-up information in case this fails to resolve over the next few weeks or greater.  Patient reassured that this should resolve on its own.  There is no indication for admission.  She may discharge home at this time       Final Clinical Impression(s) / ED Diagnoses Final diagnoses:  Hematoma  Fall, sequela    Rx / DC Orders ED Discharge Orders     None         Ronny Bacon 07/13/22 1239    Varney Biles, MD 07/15/22 1337

## 2022-07-13 NOTE — ED Notes (Signed)
Ace wrap applied to left lower leg.  Education on application given.  Patient gave verbal understanding of this and of discharge instructions.

## 2022-07-13 NOTE — ED Triage Notes (Signed)
Pt arrives to ED with c/o hematoma to left lower leg post fall 10/16. Pt reports the hematoma has gotten worse.

## 2022-07-17 ENCOUNTER — Telehealth: Payer: Self-pay | Admitting: Licensed Clinical Social Worker

## 2022-07-17 ENCOUNTER — Telehealth (INDEPENDENT_AMBULATORY_CARE_PROVIDER_SITE_OTHER): Payer: Medicare Other | Admitting: Family Medicine

## 2022-07-17 ENCOUNTER — Encounter: Payer: Self-pay | Admitting: Family Medicine

## 2022-07-17 VITALS — Ht 61.0 in | Wt 130.0 lb

## 2022-07-17 DIAGNOSIS — R059 Cough, unspecified: Secondary | ICD-10-CM | POA: Diagnosis not present

## 2022-07-17 DIAGNOSIS — R0981 Nasal congestion: Secondary | ICD-10-CM | POA: Diagnosis not present

## 2022-07-17 MED ORDER — BENZONATATE 100 MG PO CAPS: 100.0000 mg | ORAL_CAPSULE | Freq: Three times a day (TID) | ORAL | 0 refills | Status: DC | PRN

## 2022-07-17 NOTE — Progress Notes (Signed)
Virtual Visit via Telephone Note  I connected with [name] on 07/17/22 at 12:00 PM EDT by telephone and verified that I am speaking with the correct person using two identifiers.   I discussed the limitations of performing an evaluation and management service by telephone and requested permission for a phone visit. The patient expressed understanding and agreed to proceed.  Location patient:  [place] Location provider: work or home office Participants present for the call: patient, provider Patient did not have a visit with me in the prior 7 days to address this/these issue(s).   History of Present Illness:  Acute telemedicine visit for congestion and cough - says feels like "a cold": -Onset: about 5-6 days ago  -did not do a covid test -Symptoms include: nasal congestion, cough, pnd -Denies: fever, CP, SOB, vomiting, diarrhea -Has tried: gargling salt water -Pertinent past medical history: see below -Pertinent medication allergies:  Allergies  Allergen Reactions   Hydromorphone Other (See Comments)    Cognitive changes  "made me unconscious"  -COVID-19 vaccine status: Immunization History  Administered Date(s) Administered   Fluad Quad(high Dose 65+) 06/30/2019, 07/13/2020, 06/12/2021, 06/26/2022   H1N1 10/19/2008   Influenza Split 07/20/2012   Influenza Whole 06/28/2006, 07/08/2007, 07/07/2008, 07/07/2009   Influenza, High Dose Seasonal PF 07/14/2013, 07/30/2017, 07/02/2018   Influenza,inj,Quad PF,6+ Mos 07/07/2014   Influenza-Unspecified 07/15/2015, 07/24/2016   PFIZER(Purple Top)SARS-COV-2 Vaccination 11/07/2019, 11/29/2019   Pneumococcal Conjugate-13 07/28/2013   Pneumococcal Polysaccharide-23 06/28/2006   Td 10/28/2003   Tdap 02/27/2020   Zoster, Live 07/07/2008      Past Medical History:  Diagnosis Date   Allergic rhinitis    Anemia    Anxiety    Bronchiectasis    Carpal tunnel syndrome 07/13/2015   Bilateral   CVA (cerebral infarction) 10/2007   Right  thalamic    Diverticulosis of colon    Gait disorder    GERD (gastroesophageal reflux disease)    History of breast cancer    HTN (hypertension)    Hyperlipidemia    Idiopathic pulmonary fibrosis    Left knee DJD    Migraine    Osteoporosis    Peripheral neuropathy    PMR (polymyalgia rheumatica) (HCC)    Polyneuropathy in other diseases classified elsewhere ([place]) 02/17/2013   Previous back surgery 10/09/12   Renal insufficiency    Rheumatoid arthritis (HCC)    Seizure disorder (HCC)    Spondylosis, cervical, with myelopathy 08/31/2015   C3-4 myelopathy   Temporal arteritis (HCC)    Right eye blind, on steroids per [facility]/ Dr [name]   Type II or unspecified type diabetes mellitus without mention of complication, not stated as uncontrolled    2nd to steriods   Vitamin D deficiency     Current Outpatient Medications on File Prior to Visit  Medication Sig Dispense Refill   amLODipine (NORVASC) 5 MG tablet Take 1 tablet (5 mg total) by mouth daily. 90 tablet 3   Calcium Carb-Cholecalciferol (CALCIUM CARBONATE-VITAMIN D3 PO) Take 1 tablet by mouth daily     cholecalciferol (VITAMIN D) 1000 UNITS tablet Take 1,000 Units by mouth daily.     denosumab (PROLIA) 60 MG/ML SOLN injection Inject 60 mg into the skin every 6 (six) months.     diclofenac sodium (VOLTAREN) 1 % GEL APPLY 2 GRAMS TOPICALLY FOUR TIMES A DAY 400 g 6   ferrous sulfate 325 (65 FE) MG tablet Take 325 mg by mouth 2 (two) times daily.     fluticasone (FLONASE) 50  MCG/ACT nasal spray Place 2 sprays into both nostrils daily. 16 g 6   furosemide (LASIX) 40 MG tablet TAKE 1 TABLET DAILY 90 tablet 3   gabapentin (NEURONTIN) 300 MG capsule Take 1 capsule (300 mg total) by mouth 2 (two) times daily. 360 capsule 0   Golimumab ([place] ARIA IV) Inject into the vein. Takes infusion every 8 wks.     Multiple Vitamins-Minerals (MULTIVITAMIN,TX-MINERALS) tablet Take 1 tablet by mouth daily.     oxyCODONE (OXYCONTIN) 10 mg 12 hr  tablet Take 1 tablet (10 mg total) by mouth every 12 (twelve) hours. 60 tablet 0   oxyCODONE (OXYCONTIN) 10 mg 12 hr tablet Take 1 tablet (10 mg total) by mouth every 12 (twelve) hours. 60 tablet 0   oxyCODONE (OXYCONTIN) 10 mg 12 hr tablet Take 1 tablet (10 mg total) by mouth every 12 (twelve) hours. 20 tablet 0   potassium chloride (KLOR-CON M) 10 MEQ tablet TAKE 1 TABLET TWICE A DAY 180 tablet 0   predniSONE (DELTASONE) 1 MG tablet TAKE 4 TABLETS DAILY WITH BREAKFAST 360 tablet 3   [DISCONTINUED] potassium chloride (K-DUR) 10 MEQ tablet Take 1 tablet (10 mEq total) by mouth 2 (two) times daily. 180 tablet 0   No current facility-administered medications on file prior to visit.    Observations/Objective: Patient sounds cheerful and well on the phone. I do not appreciate any SOB. Speech and thought processing are grossly intact. Patient reported vitals:  Assessment and Plan:  Nasal congestion  Cough, unspecified type  -we discussed possible serious and likely etiologies, options for evaluation and workup, limitations of telemedicine visit vs in person visit, treatment, treatment risks and precautions. Pt prefers to treat via telemedicine empirically rather than in person at this moment. Suspect VURI vs other. Discussed possibility of covid or flu - though less likely and out of tx window for oral medications for theses. She has opted to try nasal saline, cough lozenges, Tessalon rx. Advised to seek prompt virtual visit or in person care if worsening, new symptoms arise, or if is not improving with treatment as expected per our conversation of expected course. Discussed options for follow up care. Did let this patient know that I do telemedicine on Tuesdays and Thursdays for [facility] and those are the days I am logged into the system. Advised to schedule follow up visit with PCP, [facility] virtual visits or [facility] if any further questions or concerns to avoid delays in care.   I discussed the  assessment and treatment plan with the patient. The patient was provided an opportunity to ask questions and all were answered. The patient agreed with the plan and demonstrated an understanding of the instructions.    Follow Up Instructions:  I did not refer this patient for an OV with me in the next 24 hours for this/these issue(s).  I discussed the assessment and treatment plan with the patient. The patient was provided an opportunity to ask questions and all were answered. The patient agreed with the plan and demonstrated an understanding of the instructions.   I spent 18 minutes on the date of this visit in the care of this patient. See summary of tasks completed to properly care for this patient in the detailed notes above which also included counseling of above, review of PMH, medications, allergies, evaluation of the patient and ordering and/or  instructing patient on testing and care options.     Lucretia Kern, DO

## 2022-07-17 NOTE — Patient Instructions (Signed)
  HOME CARE TIPS:  -COVID19 testing information: [email or web address]  -I sent the medication(s) we discussed to your pharmacy: Meds ordered this encounter  Medications   benzonatate (TESSALON PERLES) 100 MG capsule    Sig: Take 1 capsule (100 mg total) by mouth 3 (three) times daily as needed.    Dispense:  30 capsule    Refill:  0   -throat lozenges with lemon and/or honey - theses are available over the counter  -can use tylenol if needed for fevers, aches and pains per instructions  -nasal saline sinus rinses twice daily  -stay hydrated, drink plenty of fluids and eat small healthy meals - avoid dairy  -follow up with your doctor in 2-3 days unless improving and feeling better  -stay home while sick, except to seek medical care. If you have COVID19, you will likely be contagious for 7-10 days. Flu or Influenza is likely contagious for about 7 days. Other respiratory viral infections remain contagious for 5-10+ days depending on the virus and many other factors. Wear a good mask that fits snugly (such as N95 or KN95) if around others to reduce the risk of transmission.  It was nice to meet you today, and I really hope you are feeling better soon. I help [facility] out with telemedicine visits on Tuesdays and Thursdays and am happy to help if you need a follow up virtual visit on those days. Otherwise, if you have any concerns or questions following this visit please schedule a follow up visit with your Primary Care doctor or seek care at a local urgent care clinic to avoid delays in care.    Seek in person care or schedule a follow up video visit promptly if your symptoms worsen, new concerns arise or you are not improving with treatment. Call 911 and/or seek emergency care if your symptoms are severe or life threatening.

## 2022-07-20 ENCOUNTER — Encounter: Payer: Self-pay | Admitting: Family Medicine

## 2022-07-20 ENCOUNTER — Ambulatory Visit (INDEPENDENT_AMBULATORY_CARE_PROVIDER_SITE_OTHER): Payer: Medicare Other | Admitting: Family Medicine

## 2022-07-20 VITALS — BP 128/82 | HR 77 | Temp 98.5°F | Wt 130.2 lb

## 2022-07-20 DIAGNOSIS — R0981 Nasal congestion: Secondary | ICD-10-CM | POA: Diagnosis not present

## 2022-07-20 DIAGNOSIS — S8012XD Contusion of left lower leg, subsequent encounter: Secondary | ICD-10-CM | POA: Diagnosis not present

## 2022-07-20 DIAGNOSIS — R051 Acute cough: Secondary | ICD-10-CM

## 2022-07-20 NOTE — Progress Notes (Signed)
Subjective:    Patient ID: [name], female    DOB: Aug 11, 1941, 81 y.o.   MRN: [id number]  Chief Complaint  Patient presents with   Cough    Cough and chest congestion for a wek or longer, gotten worse. Wakes her up at night, makes her nervous. Had VV with Maudie Mercury, was given tesselon perles but is not helping  Had a fall, walker got away from her and landed on foot, has hematoma but was bleeding this morning    HPI Patient is an 81 year old female with PMH SIG for anxiety, GERD, history of breast cancer, HTN, HLD, idiopathic pulmonary fibrosis, allergic rhinitis, polymyalgia rheumatica, RA, temporal arteritis, DM 2, seizure disorder followed by Dr. Elease Hashimoto and seen today for follow-up on ongoing concern.  Patient endorses cough, chest congestion, sore throat x1 week.  Seen virtually 10/24 with Dr. Maudie Mercury.  Given Tessalon without relief in symptoms.  Patient states when symptoms initially started she had rhinorrhea, sore throat which have now resolved.  Cough sounds deep with increased phlegm.  Having difficulty getting phlegm out.  Has not tried any other medications.  Denies fever, chills, ear pain/pressure, facial pain/pressure, nausea, vomiting, diarrhea.  Sick contacts may have included her sister.  Patient seen in ED on 10/16 and 07/13/2022 for a large L leg hematoma status post fall.  States rollator moved causing fall.  Pt elevating leg, using ice, and now heat.  Also applying compression with Ace bandage.  Patient became concerned as there was a little bit of dried blood on Ace bandage this morning when she woke up.  Past Medical History:  Diagnosis Date   Allergic rhinitis    Anemia    Anxiety    Bronchiectasis    Carpal tunnel syndrome 07/13/2015   Bilateral   CVA (cerebral infarction) 10/2007   Right thalamic    Diverticulosis of colon    Gait disorder    GERD (gastroesophageal reflux disease)    History of breast cancer    HTN (hypertension)    Hyperlipidemia    Idiopathic  pulmonary fibrosis    Left knee DJD    Migraine    Osteoporosis    Peripheral neuropathy    PMR (polymyalgia rheumatica) (HCC)    Polyneuropathy in other diseases classified elsewhere ([place]) 02/17/2013   Previous back surgery 10/09/12   Renal insufficiency    Rheumatoid arthritis (HCC)    Seizure disorder (HCC)    Spondylosis, cervical, with myelopathy 08/31/2015   C3-4 myelopathy   Temporal arteritis (HCC)    Right eye blind, on steroids per [facility]/ Dr [name]   Type II or unspecified type diabetes mellitus without mention of complication, not stated as uncontrolled    2nd to steriods   Vitamin D deficiency     Allergies  Allergen Reactions   Hydromorphone Other (See Comments)    Cognitive changes  "made me unconscious"    ROS General: Denies fever, chills, night sweats, changes in weight, changes in appetite HEENT: Denies headaches, ear pain, changes in vision, rhinorrhea, sore throat + rhinorrhea, sore throat now resolved. CV: Denies CP, palpitations, SOB, orthopnea Pulm: Denies SOB, cough, wheezing + cough with ingestion GI: Denies abdominal pain, nausea, vomiting, diarrhea, constipation GU: Denies dysuria, hematuria, frequency, vaginal discharge Msk: Denies muscle cramps, joint pains Neuro: Denies weakness, numbness, tingling Skin: Denies rashes, bruising + left leg hematoma Psych: Denies depression, anxiety, hallucinations     Objective:    Blood pressure 128/82, pulse 77, temperature 98.5 F (36.9  C), temperature source Oral, weight 130 lb 3.2 oz (59.1 kg), SpO2 96 %.  Gen. Pleasant, well-nourished, in no distress, normal affect   HEENT: [place]/AT, face symmetric, conjunctiva clear, no scleral icterus, PERRLA, EOMI, nares patent without drainage, pharynx small amount of postnasal drainage, no erythema or exudate.  TMs full bilaterally.  No TTP of maxillary, ethmoid, frontal sinuses. Lungs: Productive sounding cough no accessory muscle use, CTAB, no wheezes or  rales Cardiovascular: RRR, no m/r/g, 1-2+ pitting edema LLE Musculoskeletal: No deformities, no cyanosis or clubbing, normal tone Neuro:  A&Ox3, CN II-XII intact, ambulating with rollator Skin:  Warm, dry.  Left lower leg with large hematoma with small amount of dried blood on Ace bandage.  Surrounding edema improving when compared to picture taken from ED visit.   Wt Readings from Last 3 Encounters:  07/20/22 130 lb 3.2 oz (59.1 kg)  07/17/22 130 lb (59 kg)  07/09/22 131 lb 6.3 oz (59.6 kg)    Lab Results  Component Value Date   WBC 8.3 05/16/2022   HGB 13.8 05/16/2022   HCT 41 05/16/2022   PLT 217 05/16/2022   GLUCOSE 98 09/04/2021   CHOL 144 10/18/2020   TRIG 92.0 10/18/2020   HDL 62.50 10/18/2020   LDLCALC 63 10/18/2020   ALT 10 07/14/2021   AST 21 07/14/2021   NA 138 05/16/2022   K 4.0 05/16/2022   CL 101 05/16/2022   CREATININE 1.2 (A) 05/16/2022   BUN 19 05/16/2022   CO2 31 (A) 05/16/2022   TSH 3.17 07/14/2021   INR 1.05 08/21/2012   HGBA1C 5.3 10/30/2017    Assessment/Plan:  Acute cough -Reassured as lungs clear on exam -Discussed postnasal drainage likely contributing to productive cough -Continue supportive care.  Consider OTC Mucinex for people with high blood pressure -Discussed other at home remedies -Given strict precautions  Nasal congestion -Likely contributing to cough -Discussed possible causes including viral URI versus seasonal allergies -Discussed using OTC Mucinex for patients with high blood pressure can also use saline nasal rinse or half tab Allegra -Continue with other supportive care including rest, hydration, etc. -Given precautions  Leg hematoma, left, subsequent encounter -Stable/improving -Not on a blood thinner -Continue supportive care including compression, elevation, heat  F/u as needed  Grier Mitts, MD

## 2022-07-23 DIAGNOSIS — M79605 Pain in left leg: Secondary | ICD-10-CM | POA: Diagnosis not present

## 2022-07-23 NOTE — Patient Instructions (Signed)
Visit Information  Thank you for taking time to visit with me today. Please don't hesitate to contact me if I can be of assistance to you.   Following are the goals we discussed today:   Goals Addressed             This Visit's Progress    LCSW-Increase activity outside of home/Strengthen Support System   On track    Care Coordination Interventions: Solution-Focused Strategies employed:  Active listening / Reflection utilized  Emotional Support Provided Verbalization of feelings encouraged  Pt reports feeling "a little bit better" Pt picked up medications from pharmacy LCSW assessed for [facility]. No needs identified Patient endorses limited support. States she does not have any local family or friends LCSW informed patient of care coordination services. Pt is interested in working with LCSW on strengthening support system and obtaining supportive resources LCSW discussed senior resources and will provide additional information via mail, per pt request         Our next appointment is by telephone on 07/31/22   Please call the care guide team at [phone] if you need to cancel or reschedule your appointment.   If you are experiencing a Mental Health or [place] or need someone to talk to, please call the Suicide and Crisis Lifeline: 988 call 911   Patient verbalizes understanding of instructions and care plan provided today and agrees to view in [place]. Active MyChart status and patient understanding of how to access instructions and care plan via MyChart confirmed with patient.     Christa See, MSW, [email or web address] Phone [phone] 5:32 PM

## 2022-07-23 NOTE — Patient Outreach (Signed)
  Care Coordination   Initial Visit Note   07/23/2022 Name: [name] MRN: [id number] DOB: 22-Jun-1941  [name] is a 81 y.o. year old female who sees Burchette, Alinda Sierras, MD for primary care. I spoke with  [name] by phone today.  What matters to the patients health and wellness today?  Strengthening support system    Goals Addressed             This Visit's Progress    LCSW-Increase activity outside of home/Strengthen Support System   On track    Care Coordination Interventions: Solution-Focused Strategies employed:  Active listening / Reflection utilized  Emotional Support Provided Verbalization of feelings encouraged  Pt reports feeling "a little bit better" Pt picked up medications from pharmacy LCSW assessed for [facility]. No needs identified Patient endorses limited support. States she does not have any local family or friends LCSW informed patient of care coordination services. Pt is interested in working with LCSW on strengthening support system and obtaining supportive resources LCSW discussed senior resources and will provide additional information via mail, per pt request         SDOH assessments and interventions completed:  Yes  SDOH Interventions Today    Flowsheet Row Most Recent Value  SDOH Interventions   Housing Interventions Intervention Not Indicated  Transportation Interventions Intervention Not Indicated        Care Coordination Interventions Activated:  Yes  Care Coordination Interventions:  Yes, provided   Follow up plan: Follow up call scheduled for 07/31/22    Encounter Outcome:  Pt. Visit Completed   Christa See, MSW, [name] Phone [phone] 5:32 PM

## 2022-07-27 ENCOUNTER — Ambulatory Visit: Payer: Medicare Other | Admitting: Family Medicine

## 2022-07-30 ENCOUNTER — Encounter (HOSPITAL_BASED_OUTPATIENT_CLINIC_OR_DEPARTMENT_OTHER): Payer: Self-pay

## 2022-07-30 ENCOUNTER — Other Ambulatory Visit: Payer: Self-pay

## 2022-07-30 ENCOUNTER — Emergency Department (HOSPITAL_BASED_OUTPATIENT_CLINIC_OR_DEPARTMENT_OTHER)
Admission: EM | Admit: 2022-07-30 | Discharge: 2022-07-30 | Disposition: A | Discharge status: Home / Self Care | Payer: Medicare Other | Attending: Emergency Medicine | Admitting: Emergency Medicine

## 2022-07-30 DIAGNOSIS — L089 Local infection of the skin and subcutaneous tissue, unspecified: Secondary | ICD-10-CM | POA: Diagnosis present

## 2022-07-30 DIAGNOSIS — L03116 Cellulitis of left lower limb: Secondary | ICD-10-CM | POA: Insufficient documentation

## 2022-07-30 DIAGNOSIS — M810 Age-related osteoporosis without current pathological fracture: Secondary | ICD-10-CM | POA: Diagnosis not present

## 2022-07-30 DIAGNOSIS — L0390 Cellulitis, unspecified: Secondary | ICD-10-CM

## 2022-07-30 LAB — CBC WITH DIFFERENTIAL/PLATELET
Abs Immature Granulocytes: 0.02 10*3/uL (ref 0.00–0.07)
Basophils Absolute: 0.1 10*3/uL (ref 0.0–0.1)
Basophils Relative: 1 %
Eosinophils Absolute: 0.2 10*3/uL (ref 0.0–0.5)
Eosinophils Relative: 2 %
HCT: 41.1 % (ref 36.0–46.0)
Hemoglobin: 13.5 g/dL (ref 12.0–15.0)
Immature Granulocytes: 0 %
Lymphocytes Relative: 24 %
Lymphs Abs: 2.1 10*3/uL (ref 0.7–4.0)
MCH: 32.3 pg (ref 26.0–34.0)
MCHC: 32.8 g/dL (ref 30.0–36.0)
MCV: 98.3 fL (ref 80.0–100.0)
Monocytes Absolute: 0.6 10*3/uL (ref 0.1–1.0)
Monocytes Relative: 7 %
Neutro Abs: 5.6 10*3/uL (ref 1.7–7.7)
Neutrophils Relative %: 66 %
Platelets: 259 10*3/uL (ref 150–400)
RBC: 4.18 MIL/uL (ref 3.87–5.11)
RDW: 13.7 % (ref 11.5–15.5)
WBC: 8.6 10*3/uL (ref 4.0–10.5)
nRBC: 0 % (ref 0.0–0.2)

## 2022-07-30 LAB — COMPREHENSIVE METABOLIC PANEL
ALT: 12 U/L (ref 0–44)
AST: 22 U/L (ref 15–41)
Albumin: 4.2 g/dL (ref 3.5–5.0)
Alkaline Phosphatase: 72 U/L (ref 38–126)
Anion gap: 9 (ref 5–15)
BUN: 25 mg/dL — ABNORMAL HIGH (ref 8–23)
CO2: 30 mmol/L (ref 22–32)
Calcium: 10 mg/dL (ref 8.9–10.3)
Chloride: 100 mmol/L (ref 98–111)
Creatinine, Ser: 1.51 mg/dL — ABNORMAL HIGH (ref 0.44–1.00)
GFR, Estimated: 34 mL/min — ABNORMAL LOW (ref >60)
Glucose, Bld: 138 mg/dL — ABNORMAL HIGH (ref 70–99)
Potassium: 4.1 mmol/L (ref 3.5–5.1)
Sodium: 139 mmol/L (ref 135–145)
Total Bilirubin: 0.7 mg/dL (ref 0.3–1.2)
Total Protein: 7.6 g/dL (ref 6.5–8.1)

## 2022-07-30 LAB — LACTIC ACID, PLASMA: Lactic Acid, Venous: 1.5 mmol/L (ref 0.5–1.9)

## 2022-07-30 MED ORDER — PROBIOTIC (LACTOBACILLUS) PO CAPS: 1.0000 | ORAL_CAPSULE | Freq: Every day | ORAL | 0 refills | Status: DC

## 2022-07-30 MED ORDER — CLINDAMYCIN HCL 150 MG PO CAPS
300.0000 mg | ORAL_CAPSULE | Freq: Once | ORAL | Status: AC
  Administered 2022-07-30: 300 mg via ORAL
  Filled 2022-07-30: qty 2

## 2022-07-30 MED ORDER — CLINDAMYCIN HCL 300 MG PO CAPS: 300.0000 mg | ORAL_CAPSULE | Freq: Three times a day (TID) | ORAL | 0 refills | Status: DC

## 2022-07-30 NOTE — ED Triage Notes (Signed)
Pt states that she fell on 10/16 and had hematoma after fall. Pt states that hematoma is now large and is oozing purulent drainage. Pt unable to get into wound care clinic until next week.

## 2022-07-30 NOTE — ED Provider Notes (Signed)
[facility] EMERGENCY DEPT Provider Note   CSN: [id number] Arrival date & time: 07/30/22  1459     History Chief Complaint  Patient presents with   Wound Infection    HPI [name] is a 81 y.o. female presenting for left lower extremity wound.  3 weeks ago she fell had a hematoma to her left anterior shin.  It ruptured and she had a residual ulcer.  Family shows multiple pictures.  Since the rupture, the wound has been healing very slowly.  Over the past 3 days he developed erythema and warmth.  She is ambulatory.  She denies fevers or chills, nausea vomiting, syncope shortness of breath.  She is otherwise ambulatory tolerating p.o. intake..   Patient's recorded medical, surgical, social, medication list and allergies were reviewed in the Snapshot window as part of the initial history.   Review of Systems   Review of Systems  Constitutional:  Negative for chills and fever.  HENT:  Negative for ear pain and sore throat.   Eyes:  Negative for pain and visual disturbance.  Respiratory:  Negative for cough and shortness of breath.   Cardiovascular:  Negative for chest pain and palpitations.  Gastrointestinal:  Negative for abdominal pain and vomiting.  Genitourinary:  Negative for dysuria and hematuria.  Musculoskeletal:  Negative for arthralgias and back pain.  Skin:  Positive for color change and wound. Negative for rash.  Neurological:  Negative for seizures and syncope.  All other systems reviewed and are negative.   Physical Exam Updated Vital Signs BP (!) 163/91 (BP Location: Right Arm)   Pulse (!) 102   Temp 98.3 F (36.8 C) (Oral)   Resp 16   Ht '5\' 1"'$  (1.549 m)   Wt 59 kg   SpO2 92%   BMI 24.56 kg/m  Physical Exam Vitals and nursing note reviewed.  Constitutional:      General: She is not in acute distress.    Appearance: She is well-developed.  HENT:     Head: Normocephalic and atraumatic.  Eyes:     Conjunctiva/sclera: Conjunctivae  normal.  Cardiovascular:     Rate and Rhythm: Normal rate and regular rhythm.     Heart sounds: No murmur heard. Pulmonary:     Effort: Pulmonary effort is normal. No respiratory distress.     Breath sounds: Normal breath sounds.  Abdominal:     Palpations: Abdomen is soft.     Tenderness: There is no abdominal tenderness.  Musculoskeletal:        General: Deformity and signs of injury present. No swelling.     Cervical back: Neck supple.  Skin:    General: Skin is warm and dry.     Capillary Refill: Capillary refill takes less than 2 seconds.  Neurological:     Mental Status: She is alert.  Psychiatric:        Mood and Affect: Mood normal.      ED Course/ Medical Decision Making/ A&P    Procedures Procedures   Medications Ordered in ED Medications  clindamycin (CLEOCIN) capsule 300 mg (300 mg Oral Given 07/30/22 2126)    Medical Decision Making:    [name] is a 81 y.o. female who presented to the ED today with left lower extremity skin lesion detailed above.     Patient's presentation is complicated by their history of advanced age, multiple comorbid medical problems.  Patient placed on continuous vitals and telemetry monitoring while in ED which was reviewed periodically.  Complete initial physical exam performed, notably the patient  was hemodynamically stable in no acute distress.  Large wound appreciated.  Debrided today with a significant mount of dead skin cells removed.     Reviewed and confirmed nursing documentation for past medical history, family history, social history.    Initial Assessment:   With the patient's presentation of with, most likely diagnosis is cellulitis of her wound likely due to lack of debridement prior to today, poor wound care. Other diagnoses were considered including (but not limited to) sepsis, osteomyelitis, DVT. These are considered less likely due to history of present illness and physical exam findings.   This is most  consistent with an acute life/limb threatening illness complicated by underlying chronic conditions.  Initial Plan:  I debrided the wound at bedside today.  I performed a wet-to-dry dressing and informed patient on how to perform this dressing change.  Screening labs were performed by triage and grossly reassuring without signs of systemic sepsis or other focal abnormality.  Slight creatinine rise without evidence of acute kidney injury at this time. We will start patient on clindamycin forSuspected cellulitis, risks of this medication and C. difficile discussed with the patient who will utilize supportive medications.  Unfortunately patient has not arranged follow-up with her PCP yet.  I instructed her to call her PCP in the morning for reassessment and close outpatient wound care referral.  Patient ambulatory tolerating p.o. intake at this time in no acute distress stable for outpatient care management. Disposition:  I have considered need for hospitalization, however, considering all of the above, I believe this patient is stable for discharge at this time.  Patient/family educated about specific return precautions for given chief complaint and symptoms.  Patient/family educated about follow-up with PCP.     Patient/family expressed understanding of return precautions and need for follow-up. Patient spoken to regarding all imaging and laboratory results and appropriate follow up for these results. All education provided in verbal form with additional information in written form. Time was allowed for answering of patient questions. Patient discharged.    Emergency Department Medication Summary:   Medications  clindamycin (CLEOCIN) capsule 300 mg (300 mg Oral Given 07/30/22 2126)         Clinical Impression:  1. Wound cellulitis      Discharge   Final Clinical Impression(s) / ED Diagnoses Final diagnoses:  Wound cellulitis    Rx / DC Orders ED Discharge Orders          Ordered     clindamycin (CLEOCIN) 300 MG capsule  3 times daily        07/30/22 2046    Probiotic, Lactobacillus, CAPS  Daily        07/30/22 2046              Tretha Sciara, MD 07/30/22 2144

## 2022-07-30 NOTE — ED Notes (Signed)
Discharge paperwork given and verbally understood. 

## 2022-07-31 ENCOUNTER — Telehealth: Payer: Self-pay | Admitting: Family Medicine

## 2022-07-31 ENCOUNTER — Encounter: Payer: Self-pay | Admitting: Licensed Clinical Social Worker

## 2022-07-31 NOTE — Telephone Encounter (Signed)
Refill oxyCODONE (OXYCONTIN) 10 mg 12 hr tablet  rec'd 20 tablets on 07/06/22 from [facility] then 60 pills from [facility] on 07/11/22 (40d supply). Says 08/01/22 would be her next supply date [place], [place] Phone: [phone]  Fax: [phone]

## 2022-07-31 NOTE — Patient Outreach (Addendum)
  Care Coordination   Follow Up Visit Note   07/31/2022 Name: [name] MRN: [id number] DOB: 1940-12-22  [name] is a 81 y.o. year old female who sees Burchette, Alinda Sierras, MD for primary care. I spoke with  [name] by phone today.  What matters to the patients health and wellness today?  ED follow up call/Supportive resources    Goals Addressed             This Visit's Progress    LCSW-Increase activity outside of home/Strengthen Support System   On track    Care Coordination Interventions: Solution-Focused Strategies employed:  Active listening / Reflection utilized  Emotional Support Provided Verbalization of feelings encouraged  Pt reports feeling "a little bit better" Pt picked up medications from pharmacy LCSW assessed for [facility]. No needs identified Patient endorses limited support. States she does not have any local family or friends LCSW reminded pt of nurse follow up in care coordination services. Pt shared she does not need additional f/up at this time LCSW informed patient of care coordination services. Pt is interested in working with LCSW on strengthening support system and obtaining supportive resources LCSW discussed senior resources and will provide additional information via mail, per pt request         SDOH assessments and interventions completed:  No     Care Coordination Interventions Activated:  Yes  Care Coordination Interventions:  Yes, provided   Follow up plan: Follow up call scheduled for 08/28/22    Encounter Outcome:  Pt. Visit Completed   [name], MSW, [name] Phone [phone] 3:38 PM

## 2022-07-31 NOTE — Patient Instructions (Addendum)
Visit Information  Thank you for taking time to visit with me today. Please don't hesitate to contact me if I can be of assistance to you.   Following are the goals we discussed today:   Goals Addressed             This Visit's Progress    LCSW-Increase activity outside of home/Strengthen Support System   On track    Care Coordination Interventions: Solution-Focused Strategies employed:  Active listening / Reflection utilized  Emotional Support Provided Verbalization of feelings encouraged  Pt reports feeling "a little bit better" Pt picked up medications from pharmacy LCSW assessed for [facility]. No needs identified Patient endorses limited support. States she does not have any local family or friends LCSW reminded pt of nurse follow up in care coordination services. Pt shared she does not need additional f/up at this time LCSW informed patient of care coordination services. Pt is interested in working with LCSW on strengthening support system and obtaining supportive resources LCSW discussed senior resources and will provide additional information via mail, per pt request         Our next appointment is by telephone on 08/28/22 at 3 PM  Please call the care guide team at [phone] if you need to cancel or reschedule your appointment.   If you are experiencing a Mental Health or [place] or need someone to talk to, please call the Suicide and Crisis Lifeline: 988 call 911   Patient verbalizes understanding of instructions and care plan provided today and agrees to view in [place]. Active MyChart status and patient understanding of how to access instructions and care plan via MyChart confirmed with patient.     Christa See, MSW, [email or web address] Phone [phone] 3:38 PM

## 2022-07-31 NOTE — Telephone Encounter (Signed)
Pt called again and the below message is correct please order her medication tomorrow on 08-01-2022 from [facility]

## 2022-07-31 NOTE — Telephone Encounter (Addendum)
Pt called back and said please disregard the below message

## 2022-08-01 ENCOUNTER — Encounter: Payer: Self-pay | Admitting: Family Medicine

## 2022-08-01 ENCOUNTER — Ambulatory Visit (INDEPENDENT_AMBULATORY_CARE_PROVIDER_SITE_OTHER): Payer: Medicare Other | Admitting: Family Medicine

## 2022-08-01 VITALS — BP 106/60 | HR 66 | Temp 98.0°F | Ht 61.0 in | Wt 128.8 lb

## 2022-08-01 DIAGNOSIS — G8929 Other chronic pain: Secondary | ICD-10-CM

## 2022-08-01 DIAGNOSIS — L03116 Cellulitis of left lower limb: Secondary | ICD-10-CM | POA: Diagnosis not present

## 2022-08-01 DIAGNOSIS — S81802A Unspecified open wound, left lower leg, initial encounter: Secondary | ICD-10-CM | POA: Diagnosis not present

## 2022-08-01 DIAGNOSIS — M545 Low back pain, unspecified: Secondary | ICD-10-CM

## 2022-08-01 MED ORDER — CLINDAMYCIN HCL 300 MG PO CAPS: ORAL_CAPSULE | ORAL | 0 refills | Status: DC

## 2022-08-01 NOTE — Telephone Encounter (Signed)
Patient has upcoming appointment today

## 2022-08-01 NOTE — Patient Instructions (Signed)
Keep follow up with the [facility]  Follow up for any fever or increased surrounding redness  Change dressing daily  Keep clean with soap and water  Use the vaseline gauze, along with the gauze and coban wrap.   Elevate leg frequently

## 2022-08-01 NOTE — Progress Notes (Unsigned)
Established Patient Office Visit  Subjective   Patient ID: [name], female    DOB: May 17, 1941  Age: 81 y.o. MRN: [id number]  Chief Complaint  Patient presents with   Hospitalization Follow-up   Wound Check    Patient complains of wound on left leg, x2 days    Fall   Cough    Patient complains of cough, Productive cough with yellow sputum,     HPI  {History (Optional):23778} Patient here for wound recheck and follow-up regarding cellulitis left leg.  She states that on 15 October she was getting her rollator out of her car and this got away from her and she ended up having to chase it down on basically her rollator fell over onto her leg.  She ended up with significant hematoma left mid lateral leg.  She developed some necrosis of the skin afterwards and then a few days ago developed some increased erythema and warmth.  She went to the ER on the sixth and was started on clindamycin 300 mg 3 times daily.  She feels like there is somewhat less redness today.  No fever.  She reportedly has appointment with wound care center on the 14th.  She has no history of diabetes.  No peripheral vascular disease.  Non-smoker.  She had some debridement in the ER and was started on saline wet-to-dry dressing.  No significant pain.  Past Medical History:  Diagnosis Date   Allergic rhinitis    Anemia    Anxiety    Bronchiectasis    Carpal tunnel syndrome 07/13/2015   Bilateral   CVA (cerebral infarction) 10/2007   Right thalamic    Diverticulosis of colon    Gait disorder    GERD (gastroesophageal reflux disease)    History of breast cancer    HTN (hypertension)    Hyperlipidemia    Idiopathic pulmonary fibrosis    Left knee DJD    Migraine    Osteoporosis    Peripheral neuropathy    PMR (polymyalgia rheumatica) (HCC)    Polyneuropathy in other diseases classified elsewhere ([place]) 02/17/2013   Previous back surgery 10/09/12   Renal insufficiency    Rheumatoid arthritis (HCC)     Seizure disorder (HCC)    Spondylosis, cervical, with myelopathy 08/31/2015   C3-4 myelopathy   Temporal arteritis (HCC)    Right eye blind, on steroids per [facility]/ Dr [name]   Type II or unspecified type diabetes mellitus without mention of complication, not stated as uncontrolled    2nd to steriods   Vitamin D deficiency    Past Surgical History:  Procedure Laterality Date   APPENDECTOMY  01/2021   CARPAL TUNNEL RELEASE Right    CATARACT EXTRACTION     OS - Summer 2010   CERVICAL FUSION  09/24/2008   4 rods and pins in place   CHOLECYSTECTOMY     COLONOSCOPY  12/15/2011   Procedure: COLONOSCOPY;  Surgeon: [name], MD;  Location: [facility];  Service: Endoscopy;  Laterality: N/A;   LUMBAR FUSION  04/24/2010   W/Mechanical fixation - [facility]   LUMBAR FUSION  09/25/2011   rods in hips (to stabilize).   MASTECTOMY     NECK SURGERY     rheumatoid nodule removal     SPINAL FUSION  07/25/2010   T10-L2 interbody fusion / [place]     Left    reports that she has never smoked.  She has never used smokeless tobacco. She reports that she does not currently use alcohol. She reports that she does not use drugs. family history includes Arthritis in her father and mother; Brain cancer in her mother; Breast cancer in her sister; Dementia in her father; Hypertension in her father and mother; Lung cancer in her sister. Allergies  Allergen Reactions   Hydromorphone Other (See Comments)    Cognitive changes  "made me unconscious"    Review of Systems  Constitutional:  Negative for chills and fever.      Objective:     BP 106/60 (BP Location: Left Arm, Patient Position: Sitting, Cuff Size: Normal)   Pulse 66   Temp 98 F (36.7 C) (Oral)   Ht '5\' 1"'$  (1.549 m)   Wt 128 lb 12.8 oz (58.4 kg)   SpO2 98%   BMI 24.34 kg/m  {Vitals History (Optional):23777}  Physical Exam Vitals reviewed.  Constitutional:       General: She is not in acute distress.    Appearance: She is not toxic-appearing.  Cardiovascular:     Rate and Rhythm: Normal rate and regular rhythm.  Pulmonary:     Effort: Pulmonary effort is normal.     Breath sounds: Normal breath sounds.  Skin:    Comments: Left lateral leg reveals wound which is 6 x 7.5 cm in dimension.  About 2 mm depth.  She has good granulation tissue.  No visible necrotic tissue at this time.  Mild surrounding erythema and increased warmth.  No purulent drainage.  No foul odor.  Neurological:     Mental Status: She is alert.      No results found for any visits on 08/01/22.  {Labs (Optional):23779}  The ASCVD Risk score (Arnett DK, et al., 2019) failed to calculate for the following reasons:   The 2019 ASCVD risk score is only valid for ages 78 to 67    Assessment & Plan:   Left leg wound following hematoma now with some cellulitis changes.  Slightly improved since starting clindamycin.  -She was given just 5 days of clindamycin and we did extend this for 3 more days.  She is aware of potential side effects including C. difficile.  She is currently tolerating well -Elevate legs frequently -Keep follow-up with wound care center as scheduled -Clean around the wound gently and daily with soap and water -Avoid peroxide, alcohol, or other caustic agents -This was redressed today after wound was cleaned with Vaseline gauze, outer gauze, and Coban  No follow-ups on file.    Carolann Littler, MD

## 2022-08-02 ENCOUNTER — Other Ambulatory Visit: Payer: Self-pay

## 2022-08-02 ENCOUNTER — Emergency Department (HOSPITAL_COMMUNITY): Payer: Medicare Other

## 2022-08-02 ENCOUNTER — Observation Stay (HOSPITAL_COMMUNITY)
Admission: EM | Admit: 2022-08-02 | Discharge: 2022-08-03 | Disposition: A | Discharge status: Home / Self Care | Payer: Medicare Other | Attending: Emergency Medicine | Admitting: Emergency Medicine

## 2022-08-02 ENCOUNTER — Encounter (HOSPITAL_COMMUNITY): Payer: Self-pay

## 2022-08-02 DIAGNOSIS — E1122 Type 2 diabetes mellitus with diabetic chronic kidney disease: Secondary | ICD-10-CM | POA: Diagnosis not present

## 2022-08-02 DIAGNOSIS — I129 Hypertensive chronic kidney disease with stage 1 through stage 4 chronic kidney disease, or unspecified chronic kidney disease: Secondary | ICD-10-CM | POA: Diagnosis not present

## 2022-08-02 DIAGNOSIS — Z853 Personal history of malignant neoplasm of breast: Secondary | ICD-10-CM | POA: Insufficient documentation

## 2022-08-02 DIAGNOSIS — R0902 Hypoxemia: Secondary | ICD-10-CM | POA: Diagnosis not present

## 2022-08-02 DIAGNOSIS — Z96652 Presence of left artificial knee joint: Secondary | ICD-10-CM | POA: Diagnosis not present

## 2022-08-02 DIAGNOSIS — E1140 Type 2 diabetes mellitus with diabetic neuropathy, unspecified: Secondary | ICD-10-CM | POA: Diagnosis not present

## 2022-08-02 DIAGNOSIS — G459 Transient cerebral ischemic attack, unspecified: Secondary | ICD-10-CM | POA: Diagnosis not present

## 2022-08-02 DIAGNOSIS — E785 Hyperlipidemia, unspecified: Secondary | ICD-10-CM

## 2022-08-02 DIAGNOSIS — N182 Chronic kidney disease, stage 2 (mild): Secondary | ICD-10-CM | POA: Diagnosis not present

## 2022-08-02 DIAGNOSIS — R42 Dizziness and giddiness: Secondary | ICD-10-CM | POA: Diagnosis not present

## 2022-08-02 DIAGNOSIS — N2889 Other specified disorders of kidney and ureter: Secondary | ICD-10-CM | POA: Diagnosis present

## 2022-08-02 DIAGNOSIS — I10 Essential (primary) hypertension: Secondary | ICD-10-CM

## 2022-08-02 DIAGNOSIS — I959 Hypotension, unspecified: Secondary | ICD-10-CM | POA: Diagnosis not present

## 2022-08-02 DIAGNOSIS — Z79899 Other long term (current) drug therapy: Secondary | ICD-10-CM | POA: Insufficient documentation

## 2022-08-02 DIAGNOSIS — K219 Gastro-esophageal reflux disease without esophagitis: Secondary | ICD-10-CM

## 2022-08-02 DIAGNOSIS — Z1152 Encounter for screening for COVID-19: Secondary | ICD-10-CM | POA: Diagnosis not present

## 2022-08-02 DIAGNOSIS — R0689 Other abnormalities of breathing: Secondary | ICD-10-CM | POA: Diagnosis not present

## 2022-08-02 DIAGNOSIS — I639 Cerebral infarction, unspecified: Secondary | ICD-10-CM | POA: Diagnosis not present

## 2022-08-02 DIAGNOSIS — I700 Atherosclerosis of aorta: Secondary | ICD-10-CM | POA: Diagnosis not present

## 2022-08-02 DIAGNOSIS — M47814 Spondylosis without myelopathy or radiculopathy, thoracic region: Secondary | ICD-10-CM | POA: Diagnosis not present

## 2022-08-02 LAB — CBC WITH DIFFERENTIAL/PLATELET
Abs Immature Granulocytes: 0.02 10*3/uL (ref 0.00–0.07)
Basophils Absolute: 0.1 10*3/uL (ref 0.0–0.1)
Basophils Relative: 1 %
Eosinophils Absolute: 0.2 10*3/uL (ref 0.0–0.5)
Eosinophils Relative: 2 %
HCT: 35.6 % — ABNORMAL LOW (ref 36.0–46.0)
Hemoglobin: 12.9 g/dL (ref 12.0–15.0)
Immature Granulocytes: 0 %
Lymphocytes Relative: 17 %
Lymphs Abs: 1.4 10*3/uL (ref 0.7–4.0)
MCH: 36 pg — ABNORMAL HIGH (ref 26.0–34.0)
MCHC: 36.2 g/dL — ABNORMAL HIGH (ref 30.0–36.0)
MCV: 99.4 fL (ref 80.0–100.0)
Monocytes Absolute: 0.3 10*3/uL (ref 0.1–1.0)
Monocytes Relative: 3 %
Neutro Abs: 6.4 10*3/uL (ref 1.7–7.7)
Neutrophils Relative %: 77 %
Platelets: 258 10*3/uL (ref 150–400)
RBC: 3.58 MIL/uL — ABNORMAL LOW (ref 3.87–5.11)
RDW: 13.2 % (ref 11.5–15.5)
WBC: 8.3 10*3/uL (ref 4.0–10.5)
nRBC: 0 % (ref 0.0–0.2)

## 2022-08-02 LAB — COMPREHENSIVE METABOLIC PANEL
ALT: 14 U/L (ref 0–44)
AST: 21 U/L (ref 15–41)
Albumin: 3.4 g/dL — ABNORMAL LOW (ref 3.5–5.0)
Alkaline Phosphatase: 66 U/L (ref 38–126)
Anion gap: 12 (ref 5–15)
BUN: 15 mg/dL (ref 8–23)
CO2: 26 mmol/L (ref 22–32)
Calcium: 8.1 mg/dL — ABNORMAL LOW (ref 8.9–10.3)
Chloride: 103 mmol/L (ref 98–111)
Creatinine, Ser: 1.29 mg/dL — ABNORMAL HIGH (ref 0.44–1.00)
GFR, Estimated: 41 mL/min — ABNORMAL LOW (ref >60)
Glucose, Bld: 103 mg/dL — ABNORMAL HIGH (ref 70–99)
Potassium: 3.7 mmol/L (ref 3.5–5.1)
Sodium: 141 mmol/L (ref 135–145)
Total Bilirubin: 0.9 mg/dL (ref 0.3–1.2)
Total Protein: 7 g/dL (ref 6.5–8.1)

## 2022-08-02 LAB — PROTIME-INR
INR: 1.1 (ref 0.8–1.2)
Prothrombin Time: 14.5 seconds (ref 11.4–15.2)

## 2022-08-02 LAB — URINALYSIS, ROUTINE W REFLEX MICROSCOPIC
Bilirubin Urine: NEGATIVE
Glucose, UA: NEGATIVE mg/dL
Hgb urine dipstick: NEGATIVE
Ketones, ur: NEGATIVE mg/dL
Leukocytes,Ua: NEGATIVE
Nitrite: NEGATIVE
Protein, ur: NEGATIVE mg/dL
Specific Gravity, Urine: 1.01 (ref 1.005–1.030)
pH: 5 (ref 5.0–8.0)

## 2022-08-02 LAB — RAPID URINE DRUG SCREEN, HOSP PERFORMED
Amphetamines: NOT DETECTED
Barbiturates: NOT DETECTED
Benzodiazepines: NOT DETECTED
Cocaine: NOT DETECTED
Opiates: POSITIVE — AB
Tetrahydrocannabinol: NOT DETECTED

## 2022-08-02 LAB — TROPONIN I (HIGH SENSITIVITY): Troponin I (High Sensitivity): 10 ng/L (ref <18)

## 2022-08-02 LAB — CK: Total CK: 102 U/L (ref 38–234)

## 2022-08-02 LAB — MAGNESIUM: Magnesium: 2.2 mg/dL (ref 1.7–2.4)

## 2022-08-02 LAB — PHOSPHORUS: Phosphorus: 2 mg/dL — ABNORMAL LOW (ref 2.5–4.6)

## 2022-08-02 LAB — TSH: TSH: 1.512 u[IU]/mL (ref 0.350–4.500)

## 2022-08-02 MED ORDER — POTASSIUM PHOSPHATES 15 MMOLE/5ML IV SOLN
15.0000 mmol | Freq: Once | INTRAVENOUS | Status: AC
  Administered 2022-08-03: 15 mmol via INTRAVENOUS
  Filled 2022-08-02: qty 5

## 2022-08-02 MED ORDER — OXYCODONE HCL ER 10 MG PO T12A: 10.0000 mg | EXTENDED_RELEASE_TABLET | Freq: Two times a day (BID) | ORAL | 0 refills | Status: DC

## 2022-08-02 MED ORDER — ACETAMINOPHEN 650 MG RE SUPP: 650.0000 mg | RECTAL | Status: DC | PRN

## 2022-08-02 MED ORDER — SENNOSIDES-DOCUSATE SODIUM 8.6-50 MG PO TABS: 1.0000 | ORAL_TABLET | Freq: Every evening | ORAL | Status: DC | PRN

## 2022-08-02 MED ORDER — STROKE: EARLY STAGES OF RECOVERY BOOK
Freq: Once | Status: AC
  Filled 2022-08-02: qty 1

## 2022-08-02 MED ORDER — ACETAMINOPHEN 160 MG/5ML PO SOLN: 650.0000 mg | ORAL | Status: DC | PRN

## 2022-08-02 MED ORDER — OXYCODONE HCL ER 10 MG PO T12A
10.0000 mg | EXTENDED_RELEASE_TABLET | Freq: Two times a day (BID) | ORAL | Status: DC
  Administered 2022-08-02 – 2022-08-03 (×2): 10 mg via ORAL
  Filled 2022-08-02 (×2): qty 1

## 2022-08-02 MED ORDER — GABAPENTIN 300 MG PO CAPS
300.0000 mg | ORAL_CAPSULE | Freq: Two times a day (BID) | ORAL | Status: DC
  Administered 2022-08-02 – 2022-08-03 (×2): 300 mg via ORAL
  Filled 2022-08-02 (×2): qty 1

## 2022-08-02 MED ORDER — GUAIFENESIN ER 600 MG PO TB12
600.0000 mg | ORAL_TABLET | Freq: Two times a day (BID) | ORAL | Status: DC
  Administered 2022-08-03 (×2): 600 mg via ORAL
  Filled 2022-08-02 (×2): qty 1

## 2022-08-02 MED ORDER — ACETAMINOPHEN 325 MG PO TABS: 650.0000 mg | ORAL_TABLET | ORAL | Status: DC | PRN

## 2022-08-02 MED ORDER — ASPIRIN 300 MG RE SUPP: 300.0000 mg | Freq: Every day | RECTAL | Status: DC

## 2022-08-02 MED ORDER — SODIUM CHLORIDE 0.9 % IV BOLUS
1000.0000 mL | Freq: Once | INTRAVENOUS | Status: AC
  Administered 2022-08-02: 1000 mL via INTRAVENOUS

## 2022-08-02 MED ORDER — CLINDAMYCIN HCL 150 MG PO CAPS
300.0000 mg | ORAL_CAPSULE | Freq: Three times a day (TID) | ORAL | Status: DC
  Administered 2022-08-02 – 2022-08-03 (×3): 300 mg via ORAL
  Filled 2022-08-02 (×3): qty 2

## 2022-08-02 MED ORDER — SODIUM CHLORIDE 0.9 % IV SOLN: INTRAVENOUS | Status: DC

## 2022-08-02 MED ORDER — CLINDAMYCIN HCL 150 MG PO CAPS: 300.0000 mg | ORAL_CAPSULE | ORAL | Status: DC

## 2022-08-02 MED ORDER — ASPIRIN 325 MG PO TABS
325.0000 mg | ORAL_TABLET | Freq: Every day | ORAL | Status: DC
  Administered 2022-08-02 – 2022-08-03 (×2): 325 mg via ORAL
  Filled 2022-08-02 (×2): qty 1

## 2022-08-02 NOTE — Assessment & Plan Note (Addendum)
Restart NOrvasc 5 mg po po q day

## 2022-08-02 NOTE — Assessment & Plan Note (Signed)
-  chronic avoid nephrotoxic medications such as NSAIDs, Vanco Zosyn combo,  avoid hypotension, continue to follow renal function  

## 2022-08-02 NOTE — ED Notes (Addendum)
Patient back from CT at this time

## 2022-08-02 NOTE — ED Notes (Signed)
Pt requesting to use the restroom and use the bedside commode. Pt provided grip socks at this time and assisted to the bedside commode

## 2022-08-02 NOTE — ED Notes (Signed)
Admit provider at bedside 

## 2022-08-02 NOTE — ED Provider Notes (Signed)
[facility] EMERGENCY DEPARTMENT Provider Note   CSN: [id number] Arrival date & time: 08/02/22  1312     History  Chief Complaint  Patient presents with   Dizziness    From home via ems with c/o possible syncopal episode. States she was feeling dizzy and "foggy" this morning at approx 0900, sat on the couch, and woke up at approx 12:00. Denies fall.     [name] is a 81 y.o. female.  The history is provided by the patient and medical records. No language interpreter was used.  Dizziness    81 year old female significant history of breast cancer, idiopathic pulmonary fibrosis, prior stroke, seizure disorder, hypertension, gait disorder brought here via EMS from home with concerns of potential syncopal episode.  Patient report she this morning, felt fine, took her normal morning medication.  Around 9 AM patient states she felt off.  She described more as having bouts of dizziness but more so a lightheadedness as if she is going to fall.  She was having mental fogginess, and while trying to call her friend on the phone, she was having difficulty formulating her words.  Her friend was concerned and recommend patient to come to the ER.  Patient states his symptoms lasted for approximately an hour but has since resolved.  She feels much better.  She reported having a wound infection in her left lower extremity for the past several weeks that she is currently on clindamycin for.  She is still taking antibiotic.  She does not endorse any fever, body aches, worsening pain to her leg.  She denies nausea vomiting diarrhea or urinary symptoms.  She is not on any blood thinner medication.  She did not have breakfast this morning.  Home Medications Prior to Admission medications   Medication Sig Start Date End Date Taking? Authorizing Provider  amLODipine (NORVASC) 5 MG tablet Take 1 tablet (5 mg total) by mouth daily. 11/03/21  Yes Burchette, Alinda Sierras, MD  benzonatate (TESSALON  PERLES) 100 MG capsule Take 1 capsule (100 mg total) by mouth 3 (three) times daily as needed. 07/17/22  Yes Lucretia Kern, DO  Calcium Carb-Cholecalciferol (CALCIUM CARBONATE-VITAMIN D3 PO) Take 1 tablet by mouth daily   Yes [provider]  cholecalciferol (VITAMIN D) 1000 UNITS tablet Take 1,000 Units by mouth daily.   Yes [provider]  clindamycin (CLEOCIN) 300 MG capsule Take one capsule by mouth three times daily for three more days. 08/01/22  Yes Burchette, Alinda Sierras, MD  denosumab (PROLIA) 60 MG/ML SOLN injection Inject 60 mg into the skin every 6 (six) months.   Yes [provider]  furosemide (LASIX) 40 MG tablet TAKE 1 TABLET DAILY 09/19/21  Yes Burchette, Alinda Sierras, MD  gabapentin (NEURONTIN) 300 MG capsule Take 1 capsule (300 mg total) by mouth 2 (two) times daily. 06/29/22  Yes Burchette, Alinda Sierras, MD  Golimumab ([place] ARIA IV) Inject into the vein. Takes infusion every 8 wks.   Yes [provider]  Multiple Vitamins-Minerals (MULTIVITAMIN,TX-MINERALS) tablet Take 1 tablet by mouth daily.   Yes [provider]  oxyCODONE (OXYCONTIN) 10 mg 12 hr tablet Take 1 tablet (10 mg total) by mouth every 12 (twelve) hours. 08/02/22  Yes Burchette, Alinda Sierras, MD  potassium chloride (KLOR-CON M) 10 MEQ tablet TAKE 1 TABLET TWICE A DAY 06/04/22  Yes Burchette, Alinda Sierras, MD  predniSONE (DELTASONE) 1 MG tablet TAKE 4 TABLETS DAILY WITH BREAKFAST 01/23/22  Yes Suzzanne Cloud,  NP  Probiotic, Lactobacillus, CAPS Take 1 each by mouth daily. 07/30/22  Yes Countryman, Cheri Rous, MD  diclofenac sodium (VOLTAREN) 1 % GEL APPLY 2 GRAMS TOPICALLY FOUR TIMES A DAY Patient not taking: Reported on 08/02/2022 05/12/19   Kathrynn Ducking, MD  ferrous sulfate 325 (65 FE) MG tablet Take 325 mg by mouth 2 (two) times daily. Patient not taking: Reported on 08/02/2022    [provider]  fluticasone (FLONASE) 50 MCG/ACT nasal spray Place 2 sprays into both nostrils daily. Patient  not taking: Reported on 08/02/2022 06/19/21   Rozetta Nunnery, MD  oxyCODONE (OXYCONTIN) 10 mg 12 hr tablet Take 1 tablet (10 mg total) by mouth every 12 (twelve) hours. 08/02/22   Burchette, Alinda Sierras, MD  oxyCODONE (OXYCONTIN) 10 mg 12 hr tablet Take 1 tablet (10 mg total) by mouth every 12 (twelve) hours. 08/02/22   Burchette, Alinda Sierras, MD  potassium chloride (K-DUR) 10 MEQ tablet Take 1 tablet (10 mEq total) by mouth 2 (two) times daily. 12/29/12 07/13/20  Norins, Heinz Knuckles, MD      Allergies    Hydromorphone    Review of Systems   Review of Systems  Neurological:  Positive for dizziness.  All other systems reviewed and are negative.   Physical Exam Updated Vital Signs BP 130/68   Pulse 69   Temp 98.6 F (37 C) (Oral)   Resp 19   Ht '5\' 1"'$  (1.549 m)   Wt 58.1 kg   SpO2 97%   BMI 24.19 kg/m  Physical Exam Vitals and nursing note reviewed.  Constitutional:      General: She is not in acute distress.    Appearance: She is well-developed.  HENT:     Head: Normocephalic and atraumatic.  Eyes:     Conjunctiva/sclera: Conjunctivae normal.  Cardiovascular:     Rate and Rhythm: Normal rate and regular rhythm.  Pulmonary:     Effort: Pulmonary effort is normal.  Abdominal:     Palpations: Abdomen is soft.     Tenderness: There is no abdominal tenderness.  Musculoskeletal:     Cervical back: Neck supple.  Skin:    Findings: No rash.     Comments: Left lower extremity: There is a large skin ulceration to the anterior tib-fib with good granular tissue.  Mild surrounding skin erythema but no significant warmth.  Neurological:     Mental Status: She is alert and oriented to person, place, and time.     GCS: GCS eye subscore is 4. GCS verbal subscore is 5. GCS motor subscore is 6.     Cranial Nerves: Cranial nerves 2-12 are intact.     Sensory: Sensation is intact.     Motor: Motor function is intact.     Coordination: Coordination is intact.  Psychiatric:        Mood and  Affect: Mood normal.     ED Results / Procedures / Treatments   Labs (all labs ordered are listed, but only abnormal results are displayed) Labs Reviewed  CBC WITH DIFFERENTIAL/PLATELET - Abnormal; Notable for the following components:      Result Value   RBC 3.58 (*)    HCT 35.6 (*)    MCH 36.0 (*)    MCHC 36.2 (*)    All other components within normal limits  COMPREHENSIVE METABOLIC PANEL - Abnormal; Notable for the following components:   Glucose, Bld 103 (*)    Creatinine, Ser 1.29 (*)    Calcium 8.1 (*)  Albumin 3.4 (*)    GFR, Estimated 41 (*)    All other components within normal limits  RAPID URINE DRUG SCREEN, HOSP PERFORMED - Abnormal; Notable for the following components:   Opiates POSITIVE (*)    All other components within normal limits  URINALYSIS, ROUTINE W REFLEX MICROSCOPIC - Abnormal; Notable for the following components:   Color, Urine STRAW (*)    All other components within normal limits  MAGNESIUM    EKG None ED ECG REPORT   Date: 08/02/2022  Rate: 78  Rhythm: normal sinus rhythm  QRS Axis: left  Intervals: normal  ST/T Wave abnormalities: nonspecific ST changes  Conduction Disutrbances:none  Narrative Interpretation:   Old EKG Reviewed: unchanged  I have personally reviewed the EKG tracing and agree with the computerized printout as noted.   Radiology MR BRAIN WO CONTRAST  Result Date: 08/02/2022 CLINICAL DATA:  Transient ischemic attack. EXAM: MRI HEAD WITHOUT CONTRAST MRA HEAD WITHOUT CONTRAST TECHNIQUE: Multiplanar, multi-echo pulse sequences of the brain and surrounding structures were acquired without intravenous contrast. Angiographic images of the Circle of Willis were acquired using MRA technique without intravenous contrast. COMPARISON:  Head CT August 02, 2022; MRI of the brain January 07, 2021. FINDINGS: MRI HEAD FINDINGS The study is degraded by motion. Brain: No acute infarction, hemorrhage, hydrocephalus, extra-axial collection  or mass lesion. Scattered and confluent foci of T2 hyperintensity are seen within the white matter of the cerebral hemispheres and within the pons, nonspecific, most likely related to chronic small vessel ischemia. Moderate parenchymal volume loss. Foci of susceptibility artifact in the anterior right parietal lobe, similar to prior MRI. A new focus of susceptibility artifact is noted in the right occipital lobe. Vascular: Normal flow voids. Skull and upper cervical spine: Retro odontoid soft tissue thickening may be related to rheumatoid arthritis versus CPPD. Susceptibility artifact from cervical spine fusion. No focal marrow lesion identified. Sinuses/Orbits: No acute or significant finding. Other: None. MRA HEAD FINDINGS Anterior circulation: Normal flow related enhancement of the visualized upper cervical internal carotid arteries and intracranial internal carotid arteries noting increased tortuosity of left ICA just below the skull base. Bilateral infundibular lie are noted at the origin of the posterior communicating arteries. The bilateral ACA and MCA vascular trees are maintained. Posterior circulation: Normal caliber and flow related enhancement of the intracranial vertebral arteries, basilar artery and bilateral posterior cerebral arteries. Anatomic variants: None significant. IMPRESSION: 1. No acute intracranial abnormality. 2. Moderate chronic microvascular ischemic changes of the white matter. 3. Moderate parenchymal volume loss. 4. No evidence of intracranial large vessel occlusion or significant stenosis. Electronically Signed   By: Pedro Earls M.D.   On: 08/02/2022 17:22   MR ANGIO HEAD WO CONTRAST  Result Date: 08/02/2022 CLINICAL DATA:  Transient ischemic attack. EXAM: MRI HEAD WITHOUT CONTRAST MRA HEAD WITHOUT CONTRAST TECHNIQUE: Multiplanar, multi-echo pulse sequences of the brain and surrounding structures were acquired without intravenous contrast. Angiographic images of  the Circle of Willis were acquired using MRA technique without intravenous contrast. COMPARISON:  Head CT August 02, 2022; MRI of the brain January 07, 2021. FINDINGS: MRI HEAD FINDINGS The study is degraded by motion. Brain: No acute infarction, hemorrhage, hydrocephalus, extra-axial collection or mass lesion. Scattered and confluent foci of T2 hyperintensity are seen within the white matter of the cerebral hemispheres and within the pons, nonspecific, most likely related to chronic small vessel ischemia. Moderate parenchymal volume loss. Foci of susceptibility artifact in the anterior right parietal lobe, similar to  prior MRI. A new focus of susceptibility artifact is noted in the right occipital lobe. Vascular: Normal flow voids. Skull and upper cervical spine: Retro odontoid soft tissue thickening may be related to rheumatoid arthritis versus CPPD. Susceptibility artifact from cervical spine fusion. No focal marrow lesion identified. Sinuses/Orbits: No acute or significant finding. Other: None. MRA HEAD FINDINGS Anterior circulation: Normal flow related enhancement of the visualized upper cervical internal carotid arteries and intracranial internal carotid arteries noting increased tortuosity of left ICA just below the skull base. Bilateral infundibular lie are noted at the origin of the posterior communicating arteries. The bilateral ACA and MCA vascular trees are maintained. Posterior circulation: Normal caliber and flow related enhancement of the intracranial vertebral arteries, basilar artery and bilateral posterior cerebral arteries. Anatomic variants: None significant. IMPRESSION: 1. No acute intracranial abnormality. 2. Moderate chronic microvascular ischemic changes of the white matter. 3. Moderate parenchymal volume loss. 4. No evidence of intracranial large vessel occlusion or significant stenosis. Electronically Signed   By: Pedro Earls M.D.   On: 08/02/2022 17:22   CT HEAD WO  CONTRAST  Result Date: 08/02/2022 CLINICAL DATA:  Dizziness EXAM: CT HEAD WITHOUT CONTRAST TECHNIQUE: Contiguous axial images were obtained from the base of the skull through the vertex without intravenous contrast. RADIATION DOSE REDUCTION: This exam was performed according to the departmental dose-optimization program which includes automated exposure control, adjustment of the mA and/or kV according to patient size and/or use of iterative reconstruction technique. COMPARISON:  MRI 01/07/2021, CT 90 09/04/2013 FINDINGS: Brain: No acute territorial infarction, hemorrhage or intracranial mass. Mild atrophy. Chronic small vessel ischemic changes of the white matter. Small chronic basal ganglial and white matter infarcts. Stable ventricle size Vascular: No hyperdense vessels. Vertebral and carotid vascular calcification Skull: Normal. Negative for fracture or focal lesion. Sinuses/Orbits: No acute finding. Other: None IMPRESSION: 1. No CT evidence for acute intracranial abnormality. 2. Atrophy and chronic small vessel ischemic changes of the white matter. Electronically Signed   By: Donavan Foil M.D.   On: 08/02/2022 16:06    Procedures Procedures    Medications Ordered in ED Medications  sodium chloride 0.9 % bolus 1,000 mL (0 mLs Intravenous Stopped 08/02/22 1913)    ED Course/ Medical Decision Making/ A&P                           Medical Decision Making Amount and/or Complexity of Data Reviewed Labs: ordered. Radiology: ordered.   BP 130/68   Pulse 69   Temp 98.6 F (37 C) (Oral)   Resp 19   Ht '5\' 1"'$  (1.549 m)   Wt 58.1 kg   SpO2 97%   BMI 24.19 kg/m   72:57 PM  81 year old female significant history of breast cancer, idiopathic pulmonary fibrosis, prior stroke, seizure disorder, hypertension, gait disorder brought here via EMS from home with concerns of potential syncopal episode.  Patient report she this morning, felt fine, took her normal morning medication.  Around 9 AM patient  states she felt off.  She described more as having bouts of dizziness but more so a lightheadedness as if she is going to fall.  She was having mental fogginess, and while trying to call her friend on the phone, she was having difficulty formulating her words.  Her friend was concerned and recommend patient to come to the ER.  Patient states his symptoms lasted for approximately an hour but has since resolved.  She feels much  better.  She reported having a wound infection in her left lower extremity for the past several weeks that she is currently on clindamycin for.  She is still taking antibiotic.  She does not endorse any fever, body aches, worsening pain to her leg.  She denies nausea vomiting diarrhea or urinary symptoms.  She is not on any blood thinner medication.  She did not have breakfast this morning.  On exam this is a well elderly female sitting in bed appears to be in no acute discomfort.  She is alert and oriented x4 and answering question appropriately.  Heart with normal rate and rhythm, lungs with coarse lung sounds to lung bases likely in the setting of idiopathic pulmonary fibrosis.  Abdomen is soft nontender.  Her left lower extremity is wrapped in Coban, wrap was removed, she has a large skin defect with good granular tissue and some mild surrounding skin erythema but no significant warmth noted.  She has intact distal pulses.  She is without focal neurodeficit.  Patient presents with symptoms suggestive of a TIA.  She is back to her baseline.  She will benefit from TIA work-up. Her ABCD2 score is 4, which puts her at moderate risk of 90 day stroke risk at 9.8%  7:59 PM I have discussed care with neurologist who recommend patient to be admitted for further TIA work-up.  8:27 PM I have consulted [facility] Dr. Roel Cluck who agrees to see and will admit pt   This patient presents to the ED for concern of aphasia, this involves an extensive number of treatment options, and is a  complaint that carries with it a high risk of complications and morbidity.  The differential diagnosis includes TIA, stroke, near syncope, electrolytes derangement, anemia, cardiac arrhythmia, medication side effect  Co morbidities that complicate the patient evaluation DM, HTN, prior stroke Additional history obtained:  Additional history obtained from EMS External records from outside source obtained and reviewed including EMR  Lab Tests:  I Ordered, and personally interpreted labs.  The pertinent results include:  opiate positive, mild aki with Cr. 1.29, ivf given  Imaging Studies ordered:  I ordered imaging studies including head CT along with brain MRI/MRA I independently visualized and interpreted imaging which showed no acute finding I agree with the radiologist interpretation  Cardiac Monitoring:  The patient was maintained on a cardiac monitor.  I personally viewed and interpreted the cardiac monitored which showed an underlying rhythm of: NSR  Medicines ordered and prescription drug management:  I ordered medication including IVF  for aki Reevaluation of the patient after these medicines showed that the patient improved I have reviewed the patients home medicines and have made adjustments as needed  Test Considered: as above  Critical Interventions: as above  Consultations Obtained:  I requested consultation with the neurology DR. Carolin Guernsey,  and discussed lab and imaging findings as well as pertinent plan - they recommend: neurology team will evaluate pt and give recs  Problem List / ED Course: TIA  Reevaluation:  After the interventions noted above, I reevaluated the patient and found that they have :improved  Social Determinants of Health: inactivity  Dispostion:  After consideration of the diagnostic results and the patients response to treatment, I feel that the patent would benefit from admission.         Final Clinical Impression(s) / ED  Diagnoses Final diagnoses:  TIA (transient ischemic attack)    Rx / DC Orders ED Discharge Orders     None  Domenic Moras, PA-C 08/02/22 2031    Jeanell Sparrow, DO 08/08/22 [phone]

## 2022-08-02 NOTE — Assessment & Plan Note (Signed)
Lipid panel in AM

## 2022-08-02 NOTE — ED Notes (Signed)
Pt visitor outside of room stating that the pt would like something to eat or drink. Sent a message to Dr. Roel Cluck reference same at this time

## 2022-08-02 NOTE — ED Notes (Signed)
Patient got dizzy and had to sit down walking to the bathroom

## 2022-08-02 NOTE — H&P (Addendum)
[name] VQM:[id number] DOB: 1941/01/20 DOA: 08/02/2022     PCP: Eulas Post, MD   Outpatient Specialists:     NEphrology:  Dr. Arty Baumgartner NEurology  Dr. [name] or Krista Blue  Pulmonology used to see Dr. Right  Patient arrived to ER on 08/02/22 at 1312 Referred by Attending Toy Baker, MD   Patient coming from:    home Lives alone,      Chief Complaint:   Chief Complaint  Patient presents with   Dizziness    From home via ems with c/o possible syncopal episode. States she was feeling dizzy and "foggy" this morning at approx 0900, sat on the couch, and woke up at approx 12:00. Denies fall.     HPI: [name] is a 81 y.o. female with medical history significant of breast cancer pulmonary fibrosis history of CVA history of seizure disorder HTN ataxia, leg wound chronic pain, GERD polymyalgia rheumatica, rheumatoid arthritis    Presented with feeling lightheaded and difficulty finding words At 9 Am pt started to have some difficulty speaking felt lightheaded mental fogginess and confusion trouble finding words.  She called her friend who recommended going to emergency department symptoms lasted for about an hour and after that she felt much better. She has known wound infection for which she takes clindamycin no fevers no chills no nausea no vomiting no diarrhea no urinary complaints.  She is not on aspirin Patient skipped her breakfast this morning She has known history of pulmonary fibrosis   Of note she has recently been seen by her primary care provider complaining of cough productive of yellow sputum on October 15 she had an accident where her walker hit her leg with hematoma left mid to lateral leg and set as an infection and has been taking clindamycin for this she has been followed by wound care for this and has an appointment on 14 November. She has chronic back pain for which she takes OxyContin 10 mg twice daily No CP   No results found for:  "[id number]"   Regarding pertinent Chronic problems:     HTN on Norvasc    CKD stage IIIa- baseline Cr 1.3 Estimated Creatinine Clearance: 27.5 mL/min (A) (by C-G formula based on SCr of 1.29 mg/dL (H)).  Lab Results  Component Value Date   CREATININE 1.29 (H) 08/02/2022   CREATININE 1.51 (H) 07/30/2022   CREATININE 1.2 (A) 05/16/2022    Hx of seizure only had one in 2009 While in ER:   Neurology has been consulted   CT HEAD non acute  MRA/MRI  no CVA  CXR - Interstitial prominence predominantly at the lung bases, compatible with known interstitial lung disease    Following Medications were ordered in ER: Medications  sodium chloride 0.9 % bolus 1,000 mL (0 mLs Intravenous Stopped 08/02/22 1913)    _______________________________________________________ ER Provider Called:  Neurology      They Recommend admit to medicine   Will see in consult   ED Triage Vitals  Enc Vitals Group     BP 08/02/22 1326 (!) 154/80     Pulse Rate 08/02/22 1326 75     Resp 08/02/22 1326 17     Temp 08/02/22 1326 98.6 F (37 C)     Temp Source 08/02/22 1326 Oral     SpO2 08/02/22 1324 100 %     Weight 08/02/22 1327 128 lb (58.1 kg)     Height 08/02/22 1327 '5\' 1"'$  (1.549 m)  Head Circumference --      Peak Flow --      Pain Score 08/02/22 1327 0     Pain Loc --      Pain Edu? --      Excl. in [place]? --   TMAX(24)@     _________________________________________ Significant initial  Findings: Abnormal Labs Reviewed  CBC WITH DIFFERENTIAL/PLATELET - Abnormal; Notable for the following components:      Result Value   RBC 3.58 (*)    HCT 35.6 (*)    MCH 36.0 (*)    MCHC 36.2 (*)    All other components within normal limits  COMPREHENSIVE METABOLIC PANEL - Abnormal; Notable for the following components:   Glucose, Bld 103 (*)    Creatinine, Ser 1.29 (*)    Calcium 8.1 (*)    Albumin 3.4 (*)    GFR, Estimated 41 (*)    All other components within normal limits  RAPID URINE DRUG  SCREEN, HOSP PERFORMED - Abnormal; Notable for the following components:   Opiates POSITIVE (*)    All other components within normal limits  URINALYSIS, ROUTINE W REFLEX MICROSCOPIC - Abnormal; Notable for the following components:   Color, Urine STRAW (*)    All other components within normal limits    _________________________ Troponin 10 ECG: Ordered Personally reviewed and interpreted by me showing: HR : 78 Rhythm:Sinus rhythm Atrial premature complexes Left axis deviation QTC 462   The recent clinical data is shown below. Vitals:   08/02/22 1800 08/02/22 1930 08/02/22 2000 08/02/22 2030  BP: 118/76 137/78 132/67 130/71  Pulse: 65 75 69 70  Resp: 14 15 (!) 28 (!) 21  Temp:      TempSrc:      SpO2: 97% 98% 95% 95%  Weight:      Height:        WBC     Component Value Date/Time   WBC 8.3 08/02/2022 1427   LYMPHSABS 1.4 08/02/2022 1427   LYMPHSABS 1.3 11/14/2007 1423   MONOABS 0.3 08/02/2022 1427   MONOABS 1.1 (H) 11/14/2007 1423   EOSABS 0.2 08/02/2022 1427   EOSABS 0.2 11/14/2007 1423   BASOSABS 0.1 08/02/2022 1427   BASOSABS 0.0 11/14/2007 1423      UA   no evidence of UTI     Urine analysis:    Component Value Date/Time   COLORURINE STRAW (A) 08/02/2022 1534   APPEARANCEUR CLEAR 08/02/2022 1534   LABSPEC 1.010 08/02/2022 1534   PHURINE 5.0 08/02/2022 1534   GLUCOSEU NEGATIVE 08/02/2022 1534   GLUCOSEU NEGATIVE 04/02/2011 1647   HGBUR NEGATIVE 08/02/2022 1534   HGBUR trace-lysed 11/10/2009 0000   BILIRUBINUR NEGATIVE 08/02/2022 1534   BILIRUBINUR negative 06/12/2021 1346   KETONESUR NEGATIVE 08/02/2022 1534   PROTEINUR NEGATIVE 08/02/2022 1534   UROBILINOGEN 0.2 06/12/2021 1346   UROBILINOGEN 0.2 04/02/2011 1647   NITRITE NEGATIVE 08/02/2022 1534   LEUKOCYTESUR NEGATIVE 08/02/2022 1534    Results for orders placed or performed in visit on 06/12/21  Urine Culture     Status: None   Collection Time: 06/12/21  2:36 PM   Specimen: Urine  Result  Value Ref Range Status   MICRO NUMBER: [id number]  Final   SPECIMEN QUALITY: Adequate  Final   Sample Source URINE  Final   STATUS: FINAL  Final   ISOLATE 1:   Final    Mixed genital flora isolated. These superficial bacteria are not indicative of a urinary tract infection. No further organism  identification is warranted on this specimen. If clinically indicated, recollect clean-catch, mid-stream urine and transfer  immediately to Urine Culture Transport Tube.     _______________________________________________ Hospitalist was called for admission for   TIA     The following Work up has been ordered so far:  Orders Placed This Encounter  Procedures   [place]   MR ANGIO HEAD WO CONTRAST   DG Chest 2 View   CBC with Differential   Comprehensive metabolic panel   Magnesium   Urine rapid drug screen (hosp performed)not at [facility]   Urinalysis, Routine w reflex microscopic   Cardiac monitoring   Initiate Carrier Fluid Protocol   Cardiac monitoring   Consult to neurology   Consult to neurology   Consult to hospitalist   Pulse oximetry, continuous   EKG 12-Lead   ED EKG   Place in observation (patient's expected length of stay will be less than 2 midnights)     OTHER Significant initial  Findings:  labs showing:    Recent Labs  Lab 07/30/22 1605 08/02/22 1427  NA 139 141  K 4.1 3.7  CO2 30 26  GLUCOSE 138* 103*  BUN 25* 15  CREATININE 1.51* 1.29*  CALCIUM 10.0 8.1*  MG  --  2.2    Cr  stable,    Lab Results  Component Value Date   CREATININE 1.29 (H) 08/02/2022   CREATININE 1.51 (H) 07/30/2022   CREATININE 1.2 (A) 05/16/2022    Recent Labs  Lab 07/30/22 1605 08/02/22 1427  AST 22 21  ALT 12 14  ALKPHOS 72 66  BILITOT 0.7 0.9  PROT 7.6 7.0  ALBUMIN 4.2 3.4*   Lab Results  Component Value Date   CALCIUM 8.1 (L) 08/02/2022   PHOS 3.6 12/14/2011    Plt: Lab Results  Component Value Date   PLT 258 08/02/2022      Recent Labs  Lab 07/30/22 1605 08/02/22 1427  WBC 8.6 8.3  NEUTROABS 5.6 6.4  HGB 13.5 12.9  HCT 41.1 35.6*  MCV 98.3 99.4  PLT 259 258    HG/HCT stable,      Component Value Date/Time   HGB 12.9 08/02/2022 1427   HGB 11.5 (L) 11/14/2007 1423   HCT 35.6 (L) 08/02/2022 1427   HCT 34.0 (L) 11/14/2007 1423   MCV 99.4 08/02/2022 1427   MCV 84.5 11/14/2007 1423           Cultures:    Component Value Date/Time   SDES BLOOD 09/01/2012 1545   SDES Blood 09/01/2012 1545   SPECREQUEST BOTTLES DRAWN AEROBIC AND ANAEROBIC 09/01/2012 1545   SPECREQUEST NONE 09/01/2012 1545   CULT NO GROWTH 5 DAYS 09/01/2012 1545   CULT NO GROWTH 5 DAYS 09/01/2012 1545   REPTSTATUS 09/08/2012 FINAL 09/01/2012 1545   REPTSTATUS 09/08/2012 FINAL 09/01/2012 1545     Radiological Exams on Admission: MR BRAIN WO CONTRAST  Result Date: 08/02/2022 CLINICAL DATA:  Transient ischemic attack. EXAM: MRI HEAD WITHOUT CONTRAST MRA HEAD WITHOUT CONTRAST TECHNIQUE: Multiplanar, multi-echo pulse sequences of the brain and surrounding structures were acquired without intravenous contrast. Angiographic images of the Circle of Willis were acquired using MRA technique without intravenous contrast. COMPARISON:  Head CT August 02, 2022; MRI of the brain January 07, 2021. FINDINGS: MRI HEAD FINDINGS The study is degraded by motion. Brain: No acute infarction, hemorrhage, hydrocephalus, extra-axial collection or mass lesion. Scattered and confluent foci of T2 hyperintensity are seen  within the white matter of the cerebral hemispheres and within the pons, nonspecific, most likely related to chronic small vessel ischemia. Moderate parenchymal volume loss. Foci of susceptibility artifact in the anterior right parietal lobe, similar to prior MRI. A new focus of susceptibility artifact is noted in the right occipital lobe. Vascular: Normal flow voids. Skull and upper cervical spine: Retro odontoid soft tissue thickening may be related  to rheumatoid arthritis versus CPPD. Susceptibility artifact from cervical spine fusion. No focal marrow lesion identified. Sinuses/Orbits: No acute or significant finding. Other: None. MRA HEAD FINDINGS Anterior circulation: Normal flow related enhancement of the visualized upper cervical internal carotid arteries and intracranial internal carotid arteries noting increased tortuosity of left ICA just below the skull base. Bilateral infundibular lie are noted at the origin of the posterior communicating arteries. The bilateral ACA and MCA vascular trees are maintained. Posterior circulation: Normal caliber and flow related enhancement of the intracranial vertebral arteries, basilar artery and bilateral posterior cerebral arteries. Anatomic variants: None significant. IMPRESSION: 1. No acute intracranial abnormality. 2. Moderate chronic microvascular ischemic changes of the white matter. 3. Moderate parenchymal volume loss. 4. No evidence of intracranial large vessel occlusion or significant stenosis. Electronically Signed   By: Pedro Earls M.D.   On: 08/02/2022 17:22   MR ANGIO HEAD WO CONTRAST  Result Date: 08/02/2022 CLINICAL DATA:  Transient ischemic attack. EXAM: MRI HEAD WITHOUT CONTRAST MRA HEAD WITHOUT CONTRAST TECHNIQUE: Multiplanar, multi-echo pulse sequences of the brain and surrounding structures were acquired without intravenous contrast. Angiographic images of the Circle of Willis were acquired using MRA technique without intravenous contrast. COMPARISON:  Head CT August 02, 2022; MRI of the brain January 07, 2021. FINDINGS: MRI HEAD FINDINGS The study is degraded by motion. Brain: No acute infarction, hemorrhage, hydrocephalus, extra-axial collection or mass lesion. Scattered and confluent foci of T2 hyperintensity are seen within the white matter of the cerebral hemispheres and within the pons, nonspecific, most likely related to chronic small vessel ischemia. Moderate parenchymal  volume loss. Foci of susceptibility artifact in the anterior right parietal lobe, similar to prior MRI. A new focus of susceptibility artifact is noted in the right occipital lobe. Vascular: Normal flow voids. Skull and upper cervical spine: Retro odontoid soft tissue thickening may be related to rheumatoid arthritis versus CPPD. Susceptibility artifact from cervical spine fusion. No focal marrow lesion identified. Sinuses/Orbits: No acute or significant finding. Other: None. MRA HEAD FINDINGS Anterior circulation: Normal flow related enhancement of the visualized upper cervical internal carotid arteries and intracranial internal carotid arteries noting increased tortuosity of left ICA just below the skull base. Bilateral infundibular lie are noted at the origin of the posterior communicating arteries. The bilateral ACA and MCA vascular trees are maintained. Posterior circulation: Normal caliber and flow related enhancement of the intracranial vertebral arteries, basilar artery and bilateral posterior cerebral arteries. Anatomic variants: None significant. IMPRESSION: 1. No acute intracranial abnormality. 2. Moderate chronic microvascular ischemic changes of the white matter. 3. Moderate parenchymal volume loss. 4. No evidence of intracranial large vessel occlusion or significant stenosis. Electronically Signed   By: Pedro Earls M.D.   On: 08/02/2022 17:22   CT HEAD WO CONTRAST  Result Date: 08/02/2022 CLINICAL DATA:  Dizziness EXAM: CT HEAD WITHOUT CONTRAST TECHNIQUE: Contiguous axial images were obtained from the base of the skull through the vertex without intravenous contrast. RADIATION DOSE REDUCTION: This exam was performed according to the departmental dose-optimization program which includes automated exposure control, adjustment of  the mA and/or kV according to patient size and/or use of iterative reconstruction technique. COMPARISON:  MRI 01/07/2021, CT 90 09/04/2013 FINDINGS: Brain:  No acute territorial infarction, hemorrhage or intracranial mass. Mild atrophy. Chronic small vessel ischemic changes of the white matter. Small chronic basal ganglial and white matter infarcts. Stable ventricle size Vascular: No hyperdense vessels. Vertebral and carotid vascular calcification Skull: Normal. Negative for fracture or focal lesion. Sinuses/Orbits: No acute finding. Other: None IMPRESSION: 1. No CT evidence for acute intracranial abnormality. 2. Atrophy and chronic small vessel ischemic changes of the white matter. Electronically Signed   By: Donavan Foil M.D.   On: 08/02/2022 16:06   _______________________________________________________________________________________________________ Latest  Blood pressure 130/71, pulse 70, temperature 98.2 F (36.8 C), temperature source Oral, resp. rate (!) 21, height '5\' 1"'$  (1.549 m), weight 58.1 kg, SpO2 95 %.   Vitals  labs and radiology finding personally reviewed  Review of Systems:    Pertinent positives include:  , fatigue, word finding difficulties   Constitutional:  No weight loss, night sweats, Fevers, chills weight loss  HEENT:  No headaches, Difficulty swallowing,Tooth/dental problems,Sore throat,  No sneezing, itching, ear ache, nasal congestion, post nasal drip,  Cardio-vascular:  No chest pain, Orthopnea, PND, anasarca, dizziness, palpitations.no Bilateral lower extremity swelling  GI:  No heartburn, indigestion, abdominal pain, nausea, vomiting, diarrhea, change in bowel habits, loss of appetite, melena, blood in stool, hematemesis Resp:  no shortness of breath at rest. No dyspnea on exertion, No excess mucus, no productive cough, No non-productive cough, No coughing up of blood.No change in color of mucus.No wheezing. Skin:  no rash or lesions. No jaundice GU:  no dysuria, change in color of urine, no urgency or frequency. No straining to urinate.  No flank pain.  Musculoskeletal:  No joint pain or no joint swelling. No  decreased range of motion. No back pain.  Psych:  No change in mood or affect. No depression or anxiety. No memory loss.  Neuro: no localizing neurological complaints, no tingling, no weakness, no double vision, no gait abnormality, no slurred speech, no confusion  All systems reviewed and apart from [place] all are negative _______________________________________________________________________________________________ Past Medical History:   Past Medical History:  Diagnosis Date   Allergic rhinitis    Anemia    Anxiety    Bronchiectasis    Carpal tunnel syndrome 07/13/2015   Bilateral   CVA (cerebral infarction) 10/2007   Right thalamic    Diverticulosis of colon    Gait disorder    GERD (gastroesophageal reflux disease)    History of breast cancer    HTN (hypertension)    Hyperlipidemia    Idiopathic pulmonary fibrosis    Left knee DJD    Migraine    Osteoporosis    Peripheral neuropathy    PMR (polymyalgia rheumatica) (HCC)    Polyneuropathy in other diseases classified elsewhere ([place]) 02/17/2013   Previous back surgery 10/09/12   Renal insufficiency    Rheumatoid arthritis (HCC)    Seizure disorder (HCC)    Spondylosis, cervical, with myelopathy 08/31/2015   C3-4 myelopathy   Temporal arteritis (HCC)    Right eye blind, on steroids per [facility]/ Dr [name]   Type II or unspecified type diabetes mellitus without mention of complication, not stated as uncontrolled    2nd to steriods   Vitamin D deficiency       Past Surgical History:  Procedure Laterality Date   APPENDECTOMY  01/2021   CARPAL TUNNEL RELEASE Right  CATARACT EXTRACTION     OS - Summer 2010   CERVICAL FUSION  09/24/2008   4 rods and pins in place   CHOLECYSTECTOMY     COLONOSCOPY  12/15/2011   Procedure: COLONOSCOPY;  Surgeon: [name], MD;  Location: [facility];  Service: Endoscopy;  Laterality: N/A;   LUMBAR FUSION  04/24/2010   W/Mechanical fixation - [facility]   LUMBAR FUSION   09/25/2011   rods in hips (to stabilize).   MASTECTOMY     NECK SURGERY     rheumatoid nodule removal     SPINAL FUSION  07/25/2010   T10-L2 interbody fusion / [facility] ARTHROPLASTY     Left    Social History:  Ambulatory   walker      reports that she has never smoked. She has never used smokeless tobacco. She reports that she does not currently use alcohol. She reports that she does not use drugs.     Family History:  Family History  Problem Relation Age of Onset   Brain cancer Mother    Hypertension Mother    Arthritis Mother    Dementia Father    Hypertension Father    Arthritis Father    Breast cancer Sister    Lung cancer Sister    Lung disease Neg Hx    Rheumatologic disease Neg Hx    ______________________________________________________________________________________________ Allergies: Allergies  Allergen Reactions   Hydromorphone Other (See Comments)    Cognitive changes  "made me unconscious" Dilaudid     Prior to Admission medications   Medication Sig Start Date End Date Taking? Authorizing Provider  amLODipine (NORVASC) 5 MG tablet Take 1 tablet (5 mg total) by mouth daily. 11/03/21  Yes Burchette, Alinda Sierras, MD  Calcium Carb-Cholecalciferol (CALCIUM CARBONATE-VITAMIN D3 PO) Take 1 tablet by mouth daily   Yes [provider]  cholecalciferol (VITAMIN D) 1000 UNITS tablet Take 1,000 Units by mouth daily.   Yes [provider]  clindamycin (CLEOCIN) 300 MG capsule Take one capsule by mouth three times daily for three more days. Patient taking differently: Take 300 mg by mouth See admin instructions. Take one capsule (300 mg) by mouth three times daily for three more days. 08/01/22  Yes Burchette, Alinda Sierras, MD  denosumab (PROLIA) 60 MG/ML SOLN injection Inject 60 mg into the skin every 6 (six) months.   Yes [provider]  docusate sodium (COLACE) 100 MG capsule Take 100 mg by mouth daily.    Yes [provider]  furosemide (LASIX) 40 MG tablet TAKE 1 TABLET DAILY 09/19/21  Yes Burchette, Alinda Sierras, MD  gabapentin (NEURONTIN) 300 MG capsule Take 1 capsule (300 mg total) by mouth 2 (two) times daily. 06/29/22  Yes Burchette, Alinda Sierras, MD  Golimumab ([place] ARIA IV) Inject into the vein. Takes infusion every 8 wks.   Yes [provider]  Multiple Vitamins-Minerals (MULTIVITAMIN,TX-MINERALS) tablet Take 1 tablet by mouth daily.   Yes [provider]  Omega-3 Fatty Acids (FISH OIL OMEGA-3 PO) Take 1 capsule by mouth daily.   Yes [provider]  oxyCODONE (OXYCONTIN) 10 mg 12 hr tablet Take 1 tablet (10 mg total) by mouth every 12 (twelve) hours. 08/02/22  Yes Burchette, Alinda Sierras, MD  oxyCODONE (OXYCONTIN) 10 mg 12 hr tablet Take 1 tablet (10 mg total) by mouth every 12 (twelve) hours. 08/02/22  Yes Burchette, Alinda Sierras, MD  oxyCODONE (OXYCONTIN) 10 mg 12 hr  tablet Take 1 tablet (10 mg total) by mouth every 12 (twelve) hours. 08/02/22  Yes Burchette, Alinda Sierras, MD  potassium chloride (KLOR-CON M) 10 MEQ tablet TAKE 1 TABLET TWICE A DAY 06/04/22  Yes Burchette, Alinda Sierras, MD  predniSONE (DELTASONE) 1 MG tablet TAKE 4 TABLETS DAILY WITH BREAKFAST Patient taking differently: Take 4 mg by mouth daily with breakfast. 01/23/22  Yes Suzzanne Cloud, NP  Probiotic, Lactobacillus, CAPS Take 1 each by mouth daily. 07/30/22  Yes Tretha Sciara, MD  vitamin B-12 (CYANOCOBALAMIN) 100 MCG tablet Take 100 mcg by mouth daily.   Yes [provider]  benzonatate (TESSALON PERLES) 100 MG capsule Take 1 capsule (100 mg total) by mouth 3 (three) times daily as needed. Patient not taking: Reported on 08/02/2022 07/17/22   Lucretia Kern, DO  fluticasone [facility]) 50 MCG/ACT nasal spray Place 2 sprays into both nostrils daily. Patient not taking: Reported on 08/02/2022 06/19/21   Rozetta Nunnery, MD  potassium chloride (K-DUR) 10 MEQ tablet Take 1 tablet (10 mEq total) by mouth 2  (two) times daily. 12/29/12 07/13/20  Neena Rhymes, MD    ___________________________________________________________________________________________________ Physical Exam:    08/02/2022    8:30 PM 08/02/2022    8:00 PM 08/02/2022    7:30 PM  Vitals with BMI  Systolic [phone]  Diastolic 71 67 78  Pulse 70 69 75     1. General:  in No  Acute distress    Chronically ill   -appearing 2. Psychological: Alert and   Oriented 3. Head/ENT:   Dry Mucous Membranes                          Head Non traumatic, neck supple                       Poor Dentition 4. SKIN:  decreased Skin turgor,  Skin clean Dry and intact  wound on left leg     5. Heart: Regular rate and rhythm no  Murmur, no Rub or gallop 6. Lungs:  no wheezes or crackles   7. Abdomen: Soft,  non-tender, Non distended  bowel sounds present 8. Lower extremities: no clubbing, cyanosis, no  edema 9. Neurologically  strength 5 out of 5 in all 4 extremities cranial nerves II through XII intact 10. MSK: Normal range of motion    Chart has been reviewed  ______________________________________________________________________________________________  Assessment/Plan  81 y.o. female with medical history significant of breast cancer pulmonary fibrosis history of CVA history of seizure disorder HTN ataxia, leg wound chronic pain, GERD polymyalgia rheumatica, rheumatoid arthritis   Admitted for TIA      Present on Admission:  TIA (transient ischemic attack)  GERD  Hyperlipidemia  Essential hypertension  Chronic renal insufficiency, stage II (mild)   TIA (transient ischemic attack)  - will admit based on TIA/CVA protocol,        MRA/MRI unremarkable Carotid Doppler ordered        Echo to evaluate for possible embolic source,        obtain cardiac enzymes,  ECG,   Lipid panel, TSH.        Order PT/OT evaluation.        Start diet if passes swallow eval         Will make sure patient is on antiplatelet ASA '81mg'$   and  statin if LDL above 70  Allow permissive Hypertension keep BP <220/120        Neurology consulted Have will see  pt in ER    GERD Chronic , continue on Protonix 40 mg po qday   Hyperlipidemia Lipid panel in AM  Essential hypertension Allow permissive HTN  SEIZURE DISORDER Chronic unlikely seizure presentation  Monitor for any seizure activity Appreciate neurology consult  Chronic renal insufficiency, stage II (mild)  -chronic avoid nephrotoxic medications such as NSAIDs, Vanco Zosyn combo,  avoid hypotension, continue to follow renal function   Other plan as per orders.  DVT prophylaxis:  SCD     Code Status:    Code Status: Prior FULL   as per patient    I had personally discussed CODE STATUS with patient      Family Communication:   Family not at  Bedside    Disposition Plan:       To home once workup is complete and patient is stable   Following barriers for discharge:                            Electrolytes corrected                                                Will need consultants to evaluate patient prior to discharge                     Would benefit from PT/OT eval prior to DC  Ordered                   Swallow eval - SLP ordered                    Consults called: neurology is aware  Admission status:  ED Disposition     ED Disposition  Admit   Condition  --   [place]: [place] [[id number]]  Level of Care: Telemetry Medical [[id number]]  May place patient in observation at [facility] or [place] if equivalent level of care is available:: No  Covid Evaluation: Asymptomatic - no recent exposure (last 10 days) testing not required  Diagnosis: TIA (transient ischemic attack) [947654]  Admitting Physician: Toy Baker [[id number]]  Attending Physician: Toy Baker [[id number]]          Obs     Level of care     tele  For   24H    Kenson Groh 08/02/2022, 10:20 PM    [facility]     after 2 AM  please page floor coverage PA If 7AM-7PM, please contact the day team taking care of the patient using [email or web address]   Patient was evaluated in the context of the global COVID-19 pandemic, which necessitated consideration that the patient might be at risk for infection with the SARS-CoV-2 virus that causes COVID-19. Institutional protocols and algorithms that pertain to the evaluation of patients at risk for COVID-19 are in a state of rapid change based on information released by regulatory bodies including the CDC and federal and state organizations. These policies and algorithms were followed during the patient's care.

## 2022-08-02 NOTE — ED Notes (Signed)
Provided pt with drink and [place] sandwich at this time

## 2022-08-02 NOTE — Subjective & Objective (Signed)
At 9 Am pt started to have some difficulty speaking felt lightheaded mental fogginess and confusion trouble finding words.  She called her friend who recommended going to emergency department symptoms lasted for about an hour and after that she felt much better. She has known wound infection for which she takes clindamycin no fevers no chills no nausea no vomiting no diarrhea no urinary complaints.  She is not on aspirin Patient skipped her breakfast this morning She has known history of pulmonary fibrosis

## 2022-08-02 NOTE — ED Notes (Signed)
Neuro at bedside at this time.

## 2022-08-02 NOTE — ED Notes (Signed)
Pt in radiology at this time. 

## 2022-08-02 NOTE — ED Notes (Signed)
Admit provider arrived and decided she was going to come back sine neuro is at bedside

## 2022-08-02 NOTE — Assessment & Plan Note (Signed)
Chronic unlikely seizure presentation  Monitor for any seizure activity Appreciate neurology consult

## 2022-08-02 NOTE — Consult Note (Incomplete)
Neurology Consultation Reason for Consult: Concern for TIA Requesting Physician: Toy Baker  CC: Dizziness, imbalance, slurred speech  History is obtained from: Patient and chart review  HPI: [name] is a 81 y.o. female with a past medical history significant for PMR/GCA complicated by right eye blindness, cervical myelopathy s/p cervical spine fusion, rheumatoid arthritis, pulmonary fibrosis, lumbar degenerative disc disease, CKD stage II, hypertension, hyperlipidemia, chart diagnosis of steroid related diabetes (denied by patient), chart diagnosis of right thalamic CVA (10/2007, patient not aware of this diagnosis), remote rectal bleeding  She notes that she was initially fine when she woke up at 8 AM and she taken all of her medications including her clindamycin.  Subsequently upon ambulation to the bathroom she began to feel very dizzy.  She denies any diaphoresis, does not feel like this was a lightheadedness/presyncopal kind of feeling from standing up too fast, but did have generally trouble seeing as well as a feeling that her head was enlarged.  She was finding it very hard to walk but managed to get to her recliner and then apparently lost consciousness, coming to about an hour later.  At this time she phoned a friend and felt very anxious, still felt like her head was enlarged, and had very slurred speech such that her friend was having a great deal of difficulty understanding her.  Patient did not feel like she was using incorrect words or having trouble finding her words, and she could understand her friend, but her own speech was too slurred to be understood.  Her friend repeatedly told her to call 911 and she notes that she was able to hold her phone in her left hand pretty well but had a great deal of difficulty controlling her right hand to dial the numbers, seeming quite weak in the right hand.  The dispatcher again had difficulty understanding her speech.  Regarding  seizure history on review of the chart it appears that she had altered mental status secondary to PRES in February 2009, at which time she also had acute lacunar infarct on MRI.  I do not see any other history of seizures and the patient has not been on antiseizure medications    LKW: *** Thrombolytic given?: No, or if yes, time given ***  Checklist of contraindications was reviewed and negative. Risks, benefits and alternatives were discussed *** IA performed?: No, or if yes, groin puncture time: *** Premorbid modified rankin scale: ***     0 - No symptoms.     1 - No significant disability. Able to carry out all usual activities, despite some symptoms.     2 - Slight disability. Able to look after own affairs without assistance, but unable to carry out all previous activities.     3 - Moderate disability. Requires some help, but able to walk unassisted.     4 - Moderately severe disability. Unable to attend to own bodily needs without assistance, and unable to walk unassisted.     5 - Severe disability. Requires constant nursing care and attention, bedridden, incontinent.     6 - Dead. ICH Score: ***  Time performed: *** GCS: {Blank single:19197::"3-4 is 2 points","5-12 is 1 point","13-15 is 0 points"} Infratentorial: {yes no:314532}. If yes, 1 point Volume: {Blank single:19197::">30cc is 1 point","<30cc is 0 points"}  Age: 81 y.o.. >80 is 1 point Intraventricular extension is 1 point  Score:***  A Score of {Blank single:19197::"0 points has a 30 day mortality of 0%","1 points has  a 30 day mortality of 13%","2 points has a 30 day mortality of 26%","3 points has a 30 day mortality of 72%","4 points has a 30 day mortality of 97%","5-6 points has a 30 day mortality of 100%"}. Stroke. 2001 Apr;32(4):891-7.    ROS: All other review of systems was negative except as noted in the HPI. *** Unable to obtain due to altered mental status.   Past Medical History:  Diagnosis Date  . Allergic  rhinitis   . Anemia   . Anxiety   . Bronchiectasis   . Carpal tunnel syndrome 07/13/2015   Bilateral  . CVA (cerebral infarction) 10/2007   Right thalamic   . Diverticulosis of colon   . Gait disorder   . GERD (gastroesophageal reflux disease)   . History of breast cancer   . HTN (hypertension)   . Hyperlipidemia   . Idiopathic pulmonary fibrosis   . Left knee DJD   . Migraine   . Osteoporosis   . Peripheral neuropathy   . PMR (polymyalgia rheumatica) (HCC)   . Polyneuropathy in other diseases classified elsewhere ([place]) 02/17/2013  . Previous back surgery 10/09/12  . Renal insufficiency   . Rheumatoid arthritis ([place])   . Seizure disorder ([place])   . Spondylosis, cervical, with myelopathy 08/31/2015   C3-4 myelopathy  . Temporal arteritis (HCC)    Right eye blind, on steroids per [facility]/ Dr [name]  . Type II or unspecified type diabetes mellitus without mention of complication, not stated as uncontrolled    2nd to steriods  . Vitamin D deficiency    ***  Family History  Problem Relation Age of Onset  . Brain cancer Mother   . Hypertension Mother   . Arthritis Mother   . Dementia Father   . Hypertension Father   . Arthritis Father   . Breast cancer Sister   . Lung cancer Sister   . Lung disease Neg Hx   . Rheumatologic disease Neg Hx    ***  Social History:  reports that she has never smoked. She has never used smokeless tobacco. She reports that she does not currently use alcohol. She reports that she does not use drugs. ***  Exam: Current vital signs: BP (!) 141/62   Pulse 64   Temp 98.5 F (36.9 C) (Oral)   Resp 13   Ht '5\' 1"'$  (1.549 m)   Wt 58.1 kg   SpO2 97%   BMI 24.19 kg/m  Vital signs in last 24 hours: Temp:  [97.8 F (36.6 C)-98.6 F (37 C)] 98.5 F (36.9 C) (11/09 2132) Pulse Rate:  [64-79] 64 (11/09 2130) Resp:  [13-28] 13 (11/09 2130) BP: (100-154)/(57-88) 141/62 (11/09 2130) SpO2:  [95 %-100 %] 97 % (11/09 2130) Weight:  [58.1 kg] 58.1 kg  (11/09 1327)   Physical Exam  Constitutional: Appears well-developed and well-nourished.  Psych: Affect appropriate to situation, *** Eyes: No scleral injection HENT: No oropharyngeal obstruction.  MSK: no joint deformities.  Cardiovascular: Normal rate and regular rhythm. *** Perfusing extremities well Respiratory: Effort normal, non-labored breathing GI: Soft.  No distension. There is no tenderness.  Skin: Warm dry and intact visible skin  Neuro: Mental Status: Patient is awake, alert, oriented to person, place, month, year, and situation.*** Patient is able to give a clear and coherent history.*** No signs of aphasia or neglect*** Cranial Nerves: II: Visual Fields are full. Pupils are equal, round, and reactive to light.  *** III,IV, VI: EOMI without ptosis or diploplia.  V: Facial  sensation is symmetric to temperature VII: Facial movement is symmetric.  VIII: hearing is intact to voice X: Uvula elevates symmetrically XI: Shoulder shrug is symmetric. XII: tongue is midline without atrophy or fasciculations.  Motor: Tone is normal. Bulk is normal. 5/5 strength was present in all four extremities. *** Sensory: Sensation is symmetric to light touch and temperature in the arms and legs.*** Deep Tendon Reflexes: 2+ and symmetric in the brachioradialis and patellae. *** Plantars: Toes are downgoing bilaterally. *** Cerebellar: FNF and HKS are intact bilaterally*** Gait:  Deferred in acute setting ***  NIHSS total *** Score breakdown: *** Performed at *** time of patient arrival to ED    I have reviewed labs in epic and the results pertinent to this consultation are:  Basic Metabolic Panel: Recent Labs  Lab 07/30/22 1605 08/02/22 1427  NA 139 141  K 4.1 3.7  CL 100 103  CO2 30 26  GLUCOSE 138* 103*  BUN 25* 15  CREATININE 1.51* 1.29*  CALCIUM 10.0 8.1*  MG  --  2.2    CBC: Recent Labs  Lab 07/30/22 1605 08/02/22 1427  WBC 8.6 8.3  NEUTROABS 5.6 6.4   HGB 13.5 12.9  HCT 41.1 35.6*  MCV 98.3 99.4  PLT 259 258    Coagulation Studies: Recent Labs    08/02/22 2129  LABPROT 14.5  INR 1.1     Lab Results  Component Value Date   ESRSEDRATE 15 12/27/2020   Lab Results  Component Value Date   HGBA1C 5.3 10/30/2017   Lab Results  Component Value Date   CHOL 144 10/18/2020   HDL 62.50 10/18/2020   LDLCALC 63 10/18/2020   TRIG 92.0 10/18/2020   CHOLHDL 2 10/18/2020     I have reviewed the images obtained:  MRI brain and MRA head personally reviewed, agree with radiology:   1. No acute intracranial abnormality. 2. Moderate chronic microvascular ischemic changes of the white matter. 3. Moderate parenchymal volume loss. 4. No evidence of intracranial large vessel occlusion or significant stenosis.   Impression: ***  Recommendations: - ***   Lesleigh Noe MD-PhD [facility] [phone]   *** [facility], [facility], [facility]    Total critical care time: *** minutes   Critical care time was exclusive of separately billable procedures and treating other patients.   Critical care was necessary to treat or prevent imminent or life-threatening deterioration.   Critical care was time spent personally by me on the following activities: development of treatment plan with patient and/or surrogate as well as nursing, discussions with consultants/primary team, evaluation of patient's response to treatment, examination of patient, obtaining history from patient or surrogate, ordering and performing treatments and interventions, ordering and review of laboratory studies, ordering and review of radiographic studies, and re-evaluation of patient's condition as needed, as documented above.

## 2022-08-02 NOTE — Assessment & Plan Note (Addendum)
-   will admit based on TIA/CVA protocol,        MRA/MRI unremarkable Carotid Doppler ordered        Echo to evaluate for possible embolic source,        obtain cardiac enzymes,  ECG,   Lipid panel, TSH.        Order PT/OT evaluation.        Start diet if passes swallow eval         Will make sure patient is on antiplatelet ASA '81mg'$    add Plavix 75 mg po q day and statin if LDL above 70       No need for permissive Hypertension given negative MRI       Neurology consulted Have will see  pt in ER

## 2022-08-02 NOTE — Addendum Note (Signed)
Addended by: Eulas Post on: 08/02/2022 10:25 AM   Modules accepted: Orders

## 2022-08-02 NOTE — ED Notes (Signed)
Received verbal report from Philippa Sicks at this time

## 2022-08-02 NOTE — ED Notes (Signed)
Pt to MRI at this time.

## 2022-08-02 NOTE — Consult Note (Signed)
Neurology Consultation Reason for Consult: Concern for TIA Requesting Physician: Toy Baker  CC: Dizziness, imbalance, slurred speech  History is obtained from: Patient and chart review  HPI: [name] is a 81 y.o. female with a past medical history significant for PMR/GCA complicated by right eye blindness, cervical myelopathy s/p cervical spine fusion, rheumatoid arthritis, pulmonary fibrosis, lumbar degenerative disc disease, CKD stage II, hypertension, hyperlipidemia, chart diagnosis of steroid related diabetes (denied by patient), chart diagnosis of right thalamic CVA (10/2007, patient not aware of this diagnosis), remote rectal bleeding  She notes that she was initially fine when she woke up at 8 AM and she taken all of her medications including her clindamycin recently started for cellulitis of the left leg secondary to fall and hematoma.  Subsequently upon ambulation to the bathroom she began to feel very dizzy.  She denies any diaphoresis, does not feel like this was a lightheadedness/presyncopal kind of feeling from standing up too fast, but did have generally trouble seeing as well as a feeling that her head was enlarged.  She was finding it very hard to walk but managed to get to her recliner and then apparently lost consciousness, coming to about an hour later.  At this time she phoned a friend and felt very anxious, still felt like her head was enlarged, and had very slurred speech such that her friend was having a great deal of difficulty understanding her.  Patient did not feel like she was using incorrect words or having trouble finding her words, and she could understand her friend, but her own speech was too slurred to be understood.  Her friend repeatedly told her to call 911 and she notes that she was able to hold her phone in her left hand pretty well but had a great deal of difficulty controlling her right hand to dial the numbers, seeming quite weak in the right hand.   The dispatcher again had difficulty understanding her speech.  Regarding seizure history on review of the chart it appears that she had altered mental status secondary to PRES in February 2009, at which time she also had acute lacunar infarct on MRI.  I do not see any other history of seizures and the patient has not been on antiseizure medications  She reports she has not been on aspirin due to being told by her nephrologist to avoid it for pain control.  She was unaware of a stroke diagnosis.  Premorbid modified rankin scale:      2 - Slight disability. Able to look after own affairs without assistance, but unable to carry out all previous activities.   ROS: All other review of systems was negative except as noted in the HPI.   Past Medical History:  Diagnosis Date   Allergic rhinitis    Anemia    Anxiety    Bronchiectasis    Carpal tunnel syndrome 07/13/2015   Bilateral   CVA (cerebral infarction) 10/2007   Right thalamic    Diverticulosis of colon    Gait disorder    GERD (gastroesophageal reflux disease)    History of breast cancer    HTN (hypertension)    Hyperlipidemia    Idiopathic pulmonary fibrosis    Left knee DJD    Migraine    Osteoporosis    Peripheral neuropathy    PMR (polymyalgia rheumatica) (HCC)    Polyneuropathy in other diseases classified elsewhere ([place]) 02/17/2013   Previous back surgery 10/09/12   Renal insufficiency  Rheumatoid arthritis (HCC)    Seizure disorder (HCC)    Spondylosis, cervical, with myelopathy 08/31/2015   C3-4 myelopathy   Temporal arteritis (HCC)    Right eye blind, on steroids per [facility]/ Dr [name]   Type II or unspecified type diabetes mellitus without mention of complication, not stated as uncontrolled    2nd to steriods   Vitamin D deficiency    Past Surgical History:  Procedure Laterality Date   APPENDECTOMY  01/2021   CARPAL TUNNEL RELEASE Right    CATARACT EXTRACTION     OS - Summer 2010   CERVICAL FUSION   09/24/2008   4 rods and pins in place   CHOLECYSTECTOMY     COLONOSCOPY  12/15/2011   Procedure: COLONOSCOPY;  Surgeon: [name], MD;  Location: [facility] ENDOSCOPY;  Service: Endoscopy;  Laterality: N/A;   LUMBAR FUSION  04/24/2010   W/Mechanical fixation - [facility]   LUMBAR FUSION  09/25/2011   rods in hips (to stabilize).   MASTECTOMY     NECK SURGERY     rheumatoid nodule removal     SPINAL FUSION  07/25/2010   T10-L2 interbody fusion / [facility]   TONSILLECTOMY     TOTAL KNEE ARTHROPLASTY     Left   Current Outpatient Medications  Medication Instructions   amLODipine (NORVASC) 5 mg, Oral, Daily   benzonatate (TESSALON PERLES) 100 mg, Oral, 3 times daily PRN   Calcium Carb-Cholecalciferol (CALCIUM CARBONATE-VITAMIN D3 PO) Take 1 tablet by mouth daily    cholecalciferol (VITAMIN D) 1,000 Units, Oral, Daily,     clindamycin (CLEOCIN) 300 MG capsule Take one capsule by mouth three times daily for three more days.   denosumab (PROLIA) 60 mg, Subcutaneous, Every 6 months,     docusate sodium (COLACE) 100 mg, Oral, Daily   fluticasone (FLONASE) 50 MCG/ACT nasal spray 2 sprays, Each Nare, Daily   furosemide (LASIX) 40 MG tablet TAKE 1 TABLET DAILY   gabapentin (NEURONTIN) 300 mg, Oral, 2 times daily   Golimumab (SIMPONI ARIA IV) Intravenous, Takes infusion every 8 wks.   Multiple Vitamins-Minerals (MULTIVITAMIN,TX-MINERALS) tablet 1 tablet, Oral, Daily,     Omega-3 Fatty Acids (FISH OIL OMEGA-3 PO) 1 capsule, Oral, Daily   oxyCODONE (OXYCONTIN) 10 mg, Oral, Every 12 hours   oxyCODONE (OXYCONTIN) 10 mg, Oral, Every 12 hours   oxyCODONE (OXYCONTIN) 10 mg, Oral, Every 12 hours   potassium chloride (KLOR-CON M) 10 MEQ tablet TAKE 1 TABLET TWICE A DAY   predniSONE (DELTASONE) 1 MG tablet TAKE 4 TABLETS DAILY WITH BREAKFAST   Probiotic, Lactobacillus, CAPS 1 each, Oral, Daily   vitamin B-12 (CYANOCOBALAMIN) 100 mcg, Oral, Daily    Family History  Problem Relation Age  of Onset   Brain cancer Mother    Hypertension Mother    Arthritis Mother    Dementia Father    Hypertension Father    Arthritis Father    Breast cancer Sister    Lung cancer Sister    Lung disease Neg Hx    Rheumatologic disease Neg Hx    Social History:  reports that she has never smoked. She has never used smokeless tobacco. She reports that she does not currently use alcohol. She reports that she does not use drugs.   Exam: Current vital signs: BP (!) 141/62   Pulse 64   Temp 98.5 F (36.9 C) (Oral)   Resp 13   Ht _0  (1.549 m)   Wt 58.1 kg  SpO2 97%   BMI 24.19 kg/m  Vital signs in last 24 hours: Temp:  [97.8 F (36.6 C)-98.6 F (37 C)] 98.5 F (36.9 C) (11/09 2132) Pulse Rate:  [64-79] 64 (11/09 2130) Resp:  [13-28] 13 (11/09 2130) BP: (100-154)/(57-88) 141/62 (11/09 2130) SpO2:  [95 %-100 %] 97 % (11/09 2130) Weight:  [58.1 kg] 58.1 kg (11/09 1327)   Physical Exam  Constitutional: Appears somewhat frail but no acute distress Psych: Affect appropriate to situation, calm and cooperative Eyes: No scleral injection HENT: No oropharyngeal obstruction.  MSK: Some arthritic changes of her joints, limited range of motion of the neck Cardiovascular: Normal rate and regular rhythm overall but intermittently irregular on the monitor, perfusing extremities well Respiratory: Effort normal, non-labored breathing GI: Soft.  No distension. There is no tenderness.  Skin: Bandage in place on the left lower leg, hematoma of the right forearm where there was IV access attempt  Neuro: Mental Status: Patient is awake, alert, oriented to person, place, month, year, and situation. Patient is able to give a clear and coherent history. No signs of aphasia or neglect Cranial Nerves: II: Visual Fields are full and the left eye, sliver of preserved vision in the right eye but essentially blind on the right.  Right eye APD III,IV, VI: EOMI, slightly disconjugate gaze at times V:  Facial sensation is symmetric to temperature VII: Facial movement is symmetric.  VIII: hearing is intact to voice X: Uvula elevates symmetrically XI: Shoulder shrug is symmetric. XII: tongue is midline without atrophy or fasciculations.  Motor: Tone is normal. Bulk is normal. 5/5 strength was present in all four extremities.  Sensory: Sensation is symmetric to light touch and temperature in the arms and legs. Deep Tendon Reflexes: 3+ and symmetric brachioradialis, patellae, 2+ symmetric Achilles Plantars: Toes are downgoing on the left and mute on the right Cerebellar: FNF and HKS are intact bilaterally Gait:  Deferred   NIHSS total 0   I have reviewed labs in epic and the results pertinent to this consultation are:  Basic Metabolic Panel: Recent Labs  Lab 07/30/22 1605 08/02/22 1427  NA 139 141  K 4.1 3.7  CL 100 103  CO2 30 26  GLUCOSE 138* 103*  BUN 25* 15  CREATININE 1.51* 1.29*  CALCIUM 10.0 8.1*  MG  --  2.2    CBC: Recent Labs  Lab 07/30/22 1605 08/02/22 1427  WBC 8.6 8.3  NEUTROABS 5.6 6.4  HGB 13.5 12.9  HCT 41.1 35.6*  MCV 98.3 99.4  PLT 259 258    Coagulation Studies: Recent Labs    08/02/22 2129  LABPROT 14.5  INR 1.1     Lab Results  Component Value Date   ESRSEDRATE 15 12/27/2020   Lab Results  Component Value Date   HGBA1C 5.3 10/30/2017   Lab Results  Component Value Date   CHOL 144 10/18/2020   HDL 62.50 10/18/2020   LDLCALC 63 10/18/2020   TRIG 92.0 10/18/2020   CHOLHDL 2 10/18/2020   Lab Results  Component Value Date   TSH 1.512 08/02/2022     I have reviewed the images obtained:  MRI brain and MRA head personally reviewed, agree with radiology:   1. No acute intracranial abnormality. 2. Moderate chronic microvascular ischemic changes of the white matter. 3. Moderate parenchymal volume loss. 4. No evidence of intracranial large vessel occlusion or significant stenosis.   Impression: Given patient's risk  factors and age, I am concerned about the possibility of TIA,  potentially posterior circulation given loss of consciousness, generalized ataxia, severe dysarthria without aphasia as described by the patient.  Due to her history of PMR/GCA, I do think it is reasonable to test ESR as well, but will interpret with caution given her current infection/hematoma will likely raise it to some degree.  Recommendations:  # Concern for posterior circulation TIA - Stroke labs ESR, TSH (1.51), HgbA1c, fasting lipid panel - Carotid duplex - Frequent neuro checks - Echocardiogram - Aspirin 81 mg daily - Plavix 300 mg load with 75 mg daily course to be determined pending carotid duplex - Risk factor modification - Telemetry monitoring; consider 30 day event monitor on discharge if no arrythmias captured or no other indication for chronic anticoagulation discovered - Blood pressure goal   - Normotension given negative MRI brain - PT consult, OT consult, Speech consult, unless patient is back to baseline - Continue prednisone 4 mg daily for now - Stroke team to follow  [place] [phone] Available 7 PM to 7 AM, outside of these hours please call Neurologist on call as listed on Amion.

## 2022-08-02 NOTE — Telephone Encounter (Signed)
Prescriptions sent

## 2022-08-02 NOTE — Telephone Encounter (Signed)
Noted  

## 2022-08-02 NOTE — Telephone Encounter (Signed)
Pt called to say she was seen here yesterday and wants to make sure that MD calls in her Rx for  oxyCODONE (OXYCONTIN) 10 mg 12 hr tablet   Pt states MD has to call it in 2 weeks in advance.  LOV:  08/01/2022   [facility] [place], [place] Phone: [phone]  Fax: [phone]

## 2022-08-02 NOTE — Assessment & Plan Note (Signed)
Chronic , continue on Protonix 40 mg po qday

## 2022-08-03 ENCOUNTER — Observation Stay (HOSPITAL_BASED_OUTPATIENT_CLINIC_OR_DEPARTMENT_OTHER): Payer: Medicare Other

## 2022-08-03 DIAGNOSIS — G459 Transient cerebral ischemic attack, unspecified: Secondary | ICD-10-CM

## 2022-08-03 LAB — ECHOCARDIOGRAM COMPLETE
AR max vel: 2.15 cm2
AV Area VTI: 2.39 cm2
AV Area mean vel: 2.1 cm2
AV Mean grad: 4 mmHg
AV Peak grad: 7.4 mmHg
Ao pk vel: 1.36 m/s
Area-P 1/2: 3.24 cm2
Calc EF: 67.9 %
Height: 61 in
S' Lateral: 2 cm
Single Plane A2C EF: 61.7 %
Single Plane A4C EF: 67.8 %
Weight: 2048 oz

## 2022-08-03 LAB — SARS CORONAVIRUS 2 BY RT PCR: SARS Coronavirus 2 by RT PCR: NEGATIVE

## 2022-08-03 LAB — LIPID PANEL
Cholesterol: 143 mg/dL (ref 0–200)
HDL: 47 mg/dL (ref >40)
LDL Cholesterol: 85 mg/dL (ref 0–99)
Total CHOL/HDL Ratio: 3 RATIO
Triglycerides: 56 mg/dL (ref <150)
VLDL: 11 mg/dL (ref 0–40)

## 2022-08-03 LAB — SEDIMENTATION RATE: Sed Rate: 34 mm/hr — ABNORMAL HIGH (ref 0–22)

## 2022-08-03 LAB — HEMOGLOBIN A1C
Hgb A1c MFr Bld: 5 % (ref 4.8–5.6)
Mean Plasma Glucose: 96.8 mg/dL

## 2022-08-03 LAB — PREALBUMIN: Prealbumin: 13 mg/dL — ABNORMAL LOW (ref 18–38)

## 2022-08-03 LAB — TROPONIN I (HIGH SENSITIVITY): Troponin I (High Sensitivity): 9 ng/L (ref <18)

## 2022-08-03 MED ORDER — AMLODIPINE BESYLATE 5 MG PO TABS
5.0000 mg | ORAL_TABLET | Freq: Every day | ORAL | Status: DC
  Administered 2022-08-03: 5 mg via ORAL
  Filled 2022-08-03: qty 1

## 2022-08-03 MED ORDER — PREDNISONE 1 MG PO TABS
4.0000 mg | ORAL_TABLET | Freq: Every day | ORAL | Status: DC
  Administered 2022-08-03: 4 mg via ORAL
  Filled 2022-08-03: qty 4

## 2022-08-03 MED ORDER — ATORVASTATIN CALCIUM 40 MG PO TABS: 40.0000 mg | ORAL_TABLET | Freq: Every day | ORAL | 2 refills | Status: DC

## 2022-08-03 MED ORDER — ASPIRIN 81 MG PO TBEC: 81.0000 mg | DELAYED_RELEASE_TABLET | Freq: Every day | ORAL | 11 refills | Status: AC

## 2022-08-03 MED ORDER — GUAIFENESIN ER 600 MG PO TB12: 600.0000 mg | ORAL_TABLET | Freq: Two times a day (BID) | ORAL | 0 refills | Status: AC

## 2022-08-03 MED ORDER — SACCHAROMYCES BOULARDII 250 MG PO CAPS
250.0000 mg | ORAL_CAPSULE | Freq: Two times a day (BID) | ORAL | Status: DC
  Administered 2022-08-03: 250 mg via ORAL
  Filled 2022-08-03 (×2): qty 1

## 2022-08-03 MED ORDER — CLOPIDOGREL BISULFATE 75 MG PO TABS: 75.0000 mg | ORAL_TABLET | Freq: Every day | ORAL | 0 refills | Status: AC

## 2022-08-03 MED ORDER — CLOPIDOGREL BISULFATE 75 MG PO TABS: 75.0000 mg | ORAL_TABLET | Freq: Every day | ORAL | Status: DC

## 2022-08-03 MED ORDER — ATORVASTATIN CALCIUM 40 MG PO TABS
40.0000 mg | ORAL_TABLET | Freq: Every day | ORAL | Status: DC
  Administered 2022-08-03: 40 mg via ORAL
  Filled 2022-08-03: qty 1

## 2022-08-03 MED ORDER — CLOPIDOGREL BISULFATE 300 MG PO TABS
300.0000 mg | ORAL_TABLET | Freq: Once | ORAL | Status: AC
  Administered 2022-08-03: 300 mg via ORAL
  Filled 2022-08-03: qty 1

## 2022-08-03 NOTE — Evaluation (Signed)
Physical Therapy Evaluation Patient Details Name: [name] MRN: [id number] DOB: 11-13-1940 Today's Date: 08/03/2022  History of Present Illness  Pt is 81 yo female admitted with lightheadedness and difficulty word finding.  She is now back to baseline.  Per neuro TIA.  Pt with hx of breast CA, pulmonary fibrosis, CVA, seizure disorder, HTN, ataxia, leg wound, GERD, polymyalgia rheumatica, and RA.  Clinical Impression  Pt admitted with above diagnosis. Pt lives alone and is independent with use a rollator. Does report 3 falls this year.  Today, pt ambulating 5' with min guard and fatigued easily.  Her strength was equal bilaterally (other than chronic issues L ankle). Pt feels close to normal other than fatigued easily.  With hx of falls and easily fatigued will benefit from acute PT services and do recommend HHPT and possible transition to outpt PT to advance balance/decrease fall risk. Pt currently with functional limitations due to the deficits listed below (see PT Problem List). Pt will benefit from skilled PT to increase their independence and safety with mobility to allow discharge to the venue listed below.          Recommendations for follow up therapy are one component of a multi-disciplinary discharge planning process, led by the attending physician.  Recommendations may be updated based on patient status, additional functional criteria and insurance authorization.  Follow Up Recommendations Home health PT      Assistance Recommended at Discharge PRN  Patient can return home with the following  Help with stairs or ramp for entrance    Equipment Recommendations None recommended by PT  Recommendations for Other Services       Functional Status Assessment Patient has had a recent decline in their functional status and demonstrates the ability to make significant improvements in function in a reasonable and predictable amount of time.     Precautions / Restrictions  Precautions Precautions: Fall      Mobility  Bed Mobility Overal bed mobility: Needs Assistance Bed Mobility: Supine to Sit, Sit to Supine     Supine to sit: Min assist Sit to supine: Supervision   General bed mobility comments: light HHA to lift trunk    Transfers Overall transfer level: Needs assistance Equipment used: Rolling walker (2 wheels) Transfers: Sit to/from Stand Sit to Stand: Min guard           General transfer comment: min guard for safety; performed from toielt and bed; min A for ADLs due to lines    Ambulation/Gait Ambulation/Gait assistance: Min guard Gait Distance (Feet): 70 Feet Assistive device: Rolling walker (2 wheels) Gait Pattern/deviations: Step-to pattern, Decreased stride length, Trunk flexed Gait velocity: decreased     General Gait Details: cues for RW proximity (used to rollator); reports arms fatiguing toward end of walk; reports weaker than normal; tends to lean trunk to L and hips right - walking more on R side of RW cued to center  Stairs            Wheelchair Mobility    Modified Rankin (Stroke Patients Only) Modified Rankin (Stroke Patients Only) Pre-Morbid Rankin Score: Slight disability Modified Rankin: Moderate disability     Balance Overall balance assessment: Needs assistance Sitting-balance support: No upper extremity supported Sitting balance-Leahy Scale: Good     Standing balance support: No upper extremity supported, Bilateral upper extremity supported Standing balance-Leahy Scale: Fair Standing balance comment: could static stand without AD; RW to ambulate  Pertinent Vitals/Pain Pain Assessment Pain Assessment: No/denies pain    Home Living Family/patient expects to be discharged to:: Private residence Living Arrangements: Alone Available Help at Discharge: Neighbor;Available PRN/intermittently Type of Home: House Home Access: Ramped entrance       Home  Layout: One level Home Equipment: Shower seat - built in;Grab bars - tub/shower;Rollator (4 wheels)      Prior Function Prior Level of Function : Independent/Modified Independent;Driving             Mobility Comments: Ambulates with rollator; can ambulate in community; 3 falls this year - reports feet  got tangled ADLs Comments: independent with ADLs and IADLs     Hand Dominance        Extremity/Trunk Assessment   Upper Extremity Assessment Upper Extremity Assessment: Overall WFL for tasks assessed    Lower Extremity Assessment Lower Extremity Assessment: LLE deficits/detail;RLE deficits/detail RLE Deficits / Details: ROM WFL; MMT 5/5 LLE Deficits / Details: ROM : hip and knee WFL, ankle with dropped arch and limitations but functional; MMT 5/5    Cervical / Trunk Assessment Cervical / Trunk Assessment: Kyphotic;Other exceptions Cervical / Trunk Exceptions: leans shoulders L and hips R  Communication   Communication: No difficulties  Cognition Arousal/Alertness: Awake/alert Behavior During Therapy: WFL for tasks assessed/performed Overall Cognitive Status: Within Functional Limits for tasks assessed                                          General Comments General comments (skin integrity, edema, etc.): BP as follows: supine 110/85, sitting 114/71, standing 106/60.  HR and O2 sats stable.    Exercises     Assessment/Plan    PT Assessment Patient needs continued PT services  PT Problem List Decreased strength;Cardiopulmonary status limiting activity;Decreased range of motion;Decreased activity tolerance;Decreased knowledge of use of DME;Decreased balance;Decreased safety awareness;Decreased mobility;Decreased knowledge of precautions       PT Treatment Interventions DME instruction;Therapeutic exercise;Gait training;Balance training;Stair training;Neuromuscular re-education;Functional mobility training;Therapeutic activities;Patient/family  education;Modalities;Manual techniques    PT Goals (Current goals can be found in the Care Plan section)  Acute Rehab PT Goals Patient Stated Goal: return home PT Goal Formulation: With patient Time For Goal Achievement: 08/17/22 Potential to Achieve Goals: Good    Frequency Min 3X/week     Co-evaluation               AM-PAC PT "6 Clicks" Mobility  Outcome Measure Help needed turning from your back to your side while in a flat bed without using bedrails?: A Little Help needed moving from lying on your back to sitting on the side of a flat bed without using bedrails?: A Little Help needed moving to and from a bed to a chair (including a wheelchair)?: A Little Help needed standing up from a chair using your arms (e.g., wheelchair or bedside chair)?: A Little Help needed to walk in hospital room?: A Little Help needed climbing 3-5 steps with a railing? : A Little 6 Click Score: 18    End of Session Equipment Utilized During Treatment: Gait belt Activity Tolerance: Patient limited by fatigue Patient left: in bed;with call bell/phone within reach (in ED) Nurse Communication: Mobility status PT Visit Diagnosis: Other abnormalities of gait and mobility (R26.89);History of falling (Z91.81)    Time: 6010-9323 PT Time Calculation (min) (ACUTE ONLY): 27 min   Charges:   PT Evaluation $PT Eval  Low Complexity: 1 Low PT Treatments $Gait Training: 8-22 mins        Abran Richard, PT Acute Rehab [facility] [place] 08/03/2022, 1:14 PM

## 2022-08-03 NOTE — ED Notes (Signed)
Neurology at bedside.

## 2022-08-03 NOTE — Progress Notes (Signed)
Carotid duplex bilateral study completed.   Please see CV Proc for preliminary results.   Angelica Wix, RDMS, RVT  

## 2022-08-03 NOTE — Discharge Summary (Signed)
[facility]  Physician Discharge Summary   Patient ID: [name] MRN: [id number] DOB/AGE: 1940-09-28 81 y.o.  Admit date: 08/02/2022 Discharge date: 08/03/2022    PCP: Eulas Post, MD  DISCHARGE DIAGNOSES:    TIA (transient ischemic attack)   Hyperlipidemia   Essential hypertension   GERD   Chronic renal insufficiency, stage II (mild)   RECOMMENDATIONS FOR OUTPATIENT FOLLOW UP: Ambulatory referral sent to neurology, Dr. Krista Blue   Home Health: Home health PT Equipment/Devices: None  CODE STATUS: Full code  DISCHARGE CONDITION: fair  Diet recommendation: Heart healthy  INITIAL HISTORY: 81 y.o. female with medical history significant of breast cancer, pulmonary fibrosis, history of CVA, history of seizure disorder antiepileptics, HTN, ataxia, leg wound chronic pain, GERD polymyalgia rheumatica, rheumatoid arthritis who presented after feeling lightheaded and having difficulty finding words.  Concern was for TIA.  Patient was hospitalized for further management. Of note she has recently been seen by her primary care provider complaining of cough productive of yellow sputum on October 15 she had an accident where her walker hit her leg with hematoma left mid to lateral leg.  There was concern for infection and she has been taking clindamycin for this. She has been followed by wound care for this and has an appointment on 14 November. She has chronic back pain for which she takes OxyContin 10 mg twice daily   Consultants: Neurology   Procedures: Transthoracic echocardiogram and carotid Doppler studies  HOSPITAL COURSE:   TIA MRI brain did not show any acute stroke.  Carotid Doppler did not show significant stenosis.  Echocardiogram showed normal systolic function of the left ventricle.   LDL 85.  HbA1c 5.0.  TSH 1.5.  UA does not suggest infection.  Urine drug screen positive for opioids.  No arrhythmia noted on EKG. Neurology recommends aspirin and Plavix  for 3 weeks followed by aspirin alone.  Seen by PT and OT.  Home health has been ordered.  Okay for discharge today.   Seizure disorder She mentioned that she had a seizure back in 2009.  Has not had any recurrence of this.  Not on any antiepileptics.   Essential hypertension   History of temporal arteritis Followed by neurology.  Noted to be on prednisone on a daily basis.   Left leg wound Seen by wound care.  Apparently has an appointment at the wound center in the near future.  Treated with clindamycin for cellulitis.    GERD PPI   Chronic kidney disease stage II Renal function is stable.   Patient is stable.  Okay for discharge home today.   PERTINENT LABS:  The results of significant diagnostics from this hospitalization (including imaging, microbiology, ancillary and laboratory) are listed below for reference.    Microbiology: Recent Results (from the past 240 hour(s))  SARS Coronavirus 2 by RT PCR (hospital order, performed in [facility] hospital lab) *cepheid single result test* Anterior Nasal Swab     Status: None   Collection Time: 08/03/22 12:37 AM   Specimen: Anterior Nasal Swab  Result Value Ref Range Status   SARS Coronavirus 2 by RT PCR NEGATIVE NEGATIVE Final    Comment: (NOTE) SARS-CoV-2 target nucleic acids are NOT DETECTED.  The SARS-CoV-2 RNA is generally detectable in upper and lower respiratory specimens during the acute phase of infection. The lowest concentration of SARS-CoV-2 viral copies this assay can detect is 250 copies / mL. A negative result does not preclude SARS-CoV-2 infection and should not be used  as the sole basis for treatment or other patient management decisions.  A negative result may occur with improper specimen collection / handling, submission of specimen other than nasopharyngeal swab, presence of viral mutation(s) within the areas targeted by this assay, and inadequate number of viral copies (<250 copies / mL). A negative  result must be combined with clinical observations, patient history, and epidemiological information.  Fact Sheet for Patients:   https://www.patel.info/  Fact Sheet for Healthcare Providers: https://hall.com/  This test is not yet approved or  cleared by the [place] FDA and has been authorized for detection and/or diagnosis of SARS-CoV-2 by FDA under an Emergency Use Authorization (EUA).  This EUA will remain in effect (meaning this test can be used) for the duration of the COVID-19 declaration under Section 564(b)(1) of the Act, 21 U.S.C. section 360bbb-3(b)(1), unless the authorization is terminated or revoked sooner.  Performed at [facility], [place] [address] [place], [place] [address]      Labs:   Basic Metabolic Panel: Recent Labs  Lab 07/30/22 1605 08/02/22 1427 08/02/22 2129  NA 139 141  --   K 4.1 3.7  --   CL 100 103  --   CO2 30 26  --   GLUCOSE 138* 103*  --   BUN 25* 15  --   CREATININE 1.51* 1.29*  --   CALCIUM 10.0 8.1*  --   MG  --  2.2  --   PHOS  --   --  2.0*   Liver Function Tests: Recent Labs  Lab 07/30/22 1605 08/02/22 1427  AST 22 21  ALT 12 14  ALKPHOS 72 66  BILITOT 0.7 0.9  PROT 7.6 7.0  ALBUMIN 4.2 3.4*    CBC: Recent Labs  Lab 07/30/22 1605 08/02/22 1427  WBC 8.6 8.3  NEUTROABS 5.6 6.4  HGB 13.5 12.9  HCT 41.1 35.6*  MCV 98.3 99.4  PLT 259 258   Cardiac Enzymes: Recent Labs  Lab 08/02/22 2129  CKTOTAL 102      IMAGING STUDIES ECHOCARDIOGRAM COMPLETE  Result Date: 08/03/2022    ECHOCARDIOGRAM REPORT   Patient Name:   [name] Date of Exam: 08/03/2022 Medical Rec #:  [id number]        Height:       61.0 in Accession #:    [id number]       Weight:       128.0 lb Date of Birth:  1941-05-07        BSA:          1.562 m Patient Age:    63 years         BP:           124/66 mmHg Patient Gender: F                HR:           77 bpm. Exam Location:   Inpatient Procedure: 2D Echo Indications:    TIA  History:        Patient has prior history of Echocardiogram examinations, most                 recent 09/04/2012. Risk Factors:Hypertension and Dyslipidemia.  Sonographer:    Harvie Junior Referring Phys: [place]  Sonographer Comments: Technically difficult study due to poor echo windows. IMPRESSIONS  1. Left ventricular ejection fraction, by estimation, is 65 to 70%. Left ventricular ejection fraction by 2D MOD biplane is  67.9 %. The left ventricle has normal function. The left ventricle has no regional wall motion abnormalities. Left ventricular diastolic parameters are consistent with Grade I diastolic dysfunction (impaired relaxation).  2. Right ventricular systolic function is normal. The right ventricular size is normal. Tricuspid regurgitation signal is inadequate for assessing PA pressure.  3. The mitral valve is grossly normal. Trivial mitral valve regurgitation.  4. The aortic valve is tricuspid. Aortic valve regurgitation is not visualized.  5. The inferior vena cava is normal in size with greater than 50% respiratory variability, suggesting right atrial pressure of 3 mmHg. FINDINGS  Left Ventricle: Left ventricular ejection fraction, by estimation, is 65 to 70%. Left ventricular ejection fraction by 2D MOD biplane is 67.9 %. The left ventricle has normal function. The left ventricle has no regional wall motion abnormalities. The left ventricular internal cavity size was normal in size. There is no left ventricular hypertrophy. Left ventricular diastolic parameters are consistent with Grade I diastolic dysfunction (impaired relaxation). Indeterminate filling pressures. Right Ventricle: The right ventricular size is normal. No increase in right ventricular wall thickness. Right ventricular systolic function is normal. Tricuspid regurgitation signal is inadequate for assessing PA pressure. Left Atrium: Left atrial size was normal in size.  Right Atrium: Right atrial size was normal in size. Pericardium: There is no evidence of pericardial effusion. Mitral Valve: The mitral valve is grossly normal. Trivial mitral valve regurgitation. Tricuspid Valve: The tricuspid valve is normal in structure. Tricuspid valve regurgitation is not demonstrated. Aortic Valve: The aortic valve is tricuspid. Aortic valve regurgitation is not visualized. Aortic valve mean gradient measures 4.0 mmHg. Aortic valve peak gradient measures 7.4 mmHg. Aortic valve area, by VTI measures 2.39 cm. Pulmonic Valve: The pulmonic valve was normal in structure. Pulmonic valve regurgitation is not visualized. Aorta: The aortic root and ascending aorta are structurally normal, with no evidence of dilitation. Venous: The inferior vena cava is normal in size with greater than 50% respiratory variability, suggesting right atrial pressure of 3 mmHg. IAS/Shunts: The interatrial septum was not well visualized.  LEFT VENTRICLE PLAX 2D                        Biplane EF (MOD) LVIDd:         2.70 cm         LV Biplane EF:   Left LVIDs:         2.00 cm                          ventricular LV PW:         0.80 cm                          ejection LV IVS:        0.80 cm                          fraction by LVOT diam:     1.90 cm                          2D MOD LV SV:         71                               biplane is LV  SV Index:   45                               67.9 %. LVOT Area:     2.84 cm                                Diastology                                LV e' medial:    9.03 cm/s LV Volumes (MOD)               LV E/e' medial:  7.8 LV vol d, MOD    48.6 ml       LV e' lateral:   6.96 cm/s A2C:                           LV E/e' lateral: 10.2 LV vol d, MOD    47.9 ml A4C: LV vol s, MOD    18.6 ml A2C: LV vol s, MOD    15.4 ml A4C: LV SV MOD A2C:   30.0 ml LV SV MOD A4C:   47.9 ml LV SV MOD BP:    35.6 ml RIGHT VENTRICLE RV Basal diam:  3.60 cm RV Mid diam:    2.80 cm RV S prime:     13.90  cm/s TAPSE (M-mode): 2.1 cm LEFT ATRIUM             Index        RIGHT ATRIUM           Index LA Vol (A2C):   28.3 ml 18.11 ml/m  RA Area:     12.40 cm LA Vol (A4C):   47.6 ml 30.47 ml/m  RA Volume:   25.70 ml  16.45 ml/m LA Biplane Vol: 37.3 ml 23.87 ml/m  AORTIC VALVE                     PULMONIC VALVE AV Area (Vmax):    2.15 cm      PV Vmax:       0.87 m/s AV Area (Vmean):   2.10 cm      PV Peak grad:  3.0 mmHg AV Area (VTI):     2.39 cm AV Vmax:           136.00 cm/s AV Vmean:          100.000 cm/s AV VTI:            0.295 m AV Peak Grad:      7.4 mmHg AV Mean Grad:      4.0 mmHg LVOT Vmax:         103.00 cm/s LVOT Vmean:        73.900 cm/s LVOT VTI:          0.249 m LVOT/AV VTI ratio: 0.84  AORTA Ao Root diam: 3.10 cm Ao Asc diam:  3.00 cm MITRAL VALVE MV Area (PHT): 3.24 cm    SHUNTS MV Decel Time: 234 msec    Systemic VTI:  0.25 m MV E velocity: 70.70 cm/s  Systemic Diam: 1.90 cm MV A velocity: 91.30 cm/s MV E/A ratio:  0.77 Lyman Bishop MD Electronically signed by Lyman Bishop MD Signature Date/Time:  08/03/2022/1:02:44 PM    Final    VAS US CAROTID (at [facility] and [facility] only)  Result Date: 08/03/2022 Carotid Arterial Duplex Study Patient Name:  [name]  Date of Exam:   08/03/2022 Medical Rec #: [id number]         Accession #:    [id number] Date of Birth: March 27, 1941         Patient Gender: F Patient Age:   24 years Exam Location:  [facility] Procedure:      VAS US CAROTID Referring Phys: Nyoka Lint DOUTOVA --------------------------------------------------------------------------------  Indications:       TIA. Risk Factors:      Hypertension, hyperlipidemia, prior CVA. Other Factors:     Temporal arteritis. Comparison Study:  01-23-2021 Prior carotid duplex was near normal bilaterally                    with only minimal plaque/intimal thickening. Performing Technologist: Darlin Coco RDMS, RVT  Examination Guidelines: A complete evaluation includes B-mode imaging, spectral Doppler,  color Doppler, and power Doppler as needed of all accessible portions of each vessel. Bilateral testing is considered an integral part of a complete examination. Limited examinations for reoccurring indications may be performed as noted.  Right Carotid Findings: +----------+--------+--------+--------+------------------+--------+           PSV cm/sEDV cm/sStenosisPlaque DescriptionComments +----------+--------+--------+--------+------------------+--------+ CCA Prox  68      13                                         +----------+--------+--------+--------+------------------+--------+ CCA Distal49      13                                         +----------+--------+--------+--------+------------------+--------+ ICA Prox  49      14      1-39%   hyperechoic                +----------+--------+--------+--------+------------------+--------+ ICA Mid   45      12                                         +----------+--------+--------+--------+------------------+--------+ ICA Distal39      12                                         +----------+--------+--------+--------+------------------+--------+ ECA       36                                                 +----------+--------+--------+--------+------------------+--------+ +----------+--------+-------+----------------+-------------------+           PSV cm/sEDV cmsDescribe        Arm Pressure (mmHG) +----------+--------+-------+----------------+-------------------+ [id number]             Multiphasic, WNL                    +----------+--------+-------+----------------+-------------------+ +---------+--------+--+--------+--+---------+ VertebralPSV cm/s39EDV cm/s10Antegrade +---------+--------+--+--------+--+---------+  Left Carotid Findings: +----------+--------+--------+--------+------------------+--------+  PSV cm/sEDV cm/sStenosisPlaque DescriptionComments  +----------+--------+--------+--------+------------------+--------+ CCA Prox  71      13                                         +----------+--------+--------+--------+------------------+--------+ CCA Distal44      11                                         +----------+--------+--------+--------+------------------+--------+ ICA Prox  40      14                                tortuous +----------+--------+--------+--------+------------------+--------+ ICA Mid   69      25                                         +----------+--------+--------+--------+------------------+--------+ ICA Distal62      20                                         +----------+--------+--------+--------+------------------+--------+ ECA       79      10                                         +----------+--------+--------+--------+------------------+--------+ +----------+--------+--------+----------------+-------------------+           PSV cm/sEDV cm/sDescribe        Arm Pressure (mmHG) +----------+--------+--------+----------------+-------------------+ [id number]              Multiphasic, WNL                    +----------+--------+--------+----------------+-------------------+ +---------+--------+--+--------+-+---------+ VertebralPSV cm/s49EDV cm/s9Antegrade +---------+--------+--+--------+-+---------+   Summary: Right Carotid: Velocities in the right ICA are consistent with a 1-39% stenosis. Left Carotid: The extracranial vessels were near-normal with only minimal wall               thickening or plaque. Vertebrals:  Bilateral vertebral arteries demonstrate antegrade flow. Subclavians: Normal flow hemodynamics were seen in bilateral subclavian              arteries. *See table(s) above for measurements and observations.     Preliminary    DG Chest 2 View  Result Date: 08/02/2022 CLINICAL DATA:  TIA workup. EXAM: CHEST - 2 VIEW COMPARISON:  12/01/2014, 05/02/2017. FINDINGS: The  heart size and mediastinal contours are within normal limits. There is atherosclerotic calcification of the aorta. Interstitial prominence is noted in the lungs bilaterally with a basilar predominance. No effusion or pneumothorax. Cervical and thoracolumbar spinal fusion hardware is noted. Degenerative changes are present in the thoracic spine. No acute osseous abnormality. IMPRESSION: 1. No active cardiopulmonary disease. 2. Interstitial prominence predominantly at the lung bases, compatible with known interstitial lung disease. 3. Aortic atherosclerosis. Electronically Signed   By: Brett Fairy M.D.   On: 08/02/2022 21:10   MR BRAIN WO CONTRAST  Result Date: 08/02/2022 CLINICAL DATA:  Transient ischemic attack. EXAM: MRI HEAD WITHOUT CONTRAST MRA HEAD WITHOUT CONTRAST  TECHNIQUE: Multiplanar, multi-echo pulse sequences of the brain and surrounding structures were acquired without intravenous contrast. Angiographic images of the Circle of Willis were acquired using MRA technique without intravenous contrast. COMPARISON:  Head CT August 02, 2022; MRI of the brain January 07, 2021. FINDINGS: MRI HEAD FINDINGS The study is degraded by motion. Brain: No acute infarction, hemorrhage, hydrocephalus, extra-axial collection or mass lesion. Scattered and confluent foci of T2 hyperintensity are seen within the white matter of the cerebral hemispheres and within the pons, nonspecific, most likely related to chronic small vessel ischemia. Moderate parenchymal volume loss. Foci of susceptibility artifact in the anterior right parietal lobe, similar to prior MRI. A new focus of susceptibility artifact is noted in the right occipital lobe. Vascular: Normal flow voids. Skull and upper cervical spine: Retro odontoid soft tissue thickening may be related to rheumatoid arthritis versus CPPD. Susceptibility artifact from cervical spine fusion. No focal marrow lesion identified. Sinuses/Orbits: No acute or significant finding.  Other: None. MRA HEAD FINDINGS Anterior circulation: Normal flow related enhancement of the visualized upper cervical internal carotid arteries and intracranial internal carotid arteries noting increased tortuosity of left ICA just below the skull base. Bilateral infundibular lie are noted at the origin of the posterior communicating arteries. The bilateral ACA and MCA vascular trees are maintained. Posterior circulation: Normal caliber and flow related enhancement of the intracranial vertebral arteries, basilar artery and bilateral posterior cerebral arteries. Anatomic variants: None significant. IMPRESSION: 1. No acute intracranial abnormality. 2. Moderate chronic microvascular ischemic changes of the white matter. 3. Moderate parenchymal volume loss. 4. No evidence of intracranial large vessel occlusion or significant stenosis. Electronically Signed   By: Pedro Earls M.D.   On: 08/02/2022 17:22   MR ANGIO HEAD WO CONTRAST  Result Date: 08/02/2022 CLINICAL DATA:  Transient ischemic attack. EXAM: MRI HEAD WITHOUT CONTRAST MRA HEAD WITHOUT CONTRAST TECHNIQUE: Multiplanar, multi-echo pulse sequences of the brain and surrounding structures were acquired without intravenous contrast. Angiographic images of the Circle of Willis were acquired using MRA technique without intravenous contrast. COMPARISON:  Head CT August 02, 2022; MRI of the brain January 07, 2021. FINDINGS: MRI HEAD FINDINGS The study is degraded by motion. Brain: No acute infarction, hemorrhage, hydrocephalus, extra-axial collection or mass lesion. Scattered and confluent foci of T2 hyperintensity are seen within the white matter of the cerebral hemispheres and within the pons, nonspecific, most likely related to chronic small vessel ischemia. Moderate parenchymal volume loss. Foci of susceptibility artifact in the anterior right parietal lobe, similar to prior MRI. A new focus of susceptibility artifact is noted in the right  occipital lobe. Vascular: Normal flow voids. Skull and upper cervical spine: Retro odontoid soft tissue thickening may be related to rheumatoid arthritis versus CPPD. Susceptibility artifact from cervical spine fusion. No focal marrow lesion identified. Sinuses/Orbits: No acute or significant finding. Other: None. MRA HEAD FINDINGS Anterior circulation: Normal flow related enhancement of the visualized upper cervical internal carotid arteries and intracranial internal carotid arteries noting increased tortuosity of left ICA just below the skull base. Bilateral infundibular lie are noted at the origin of the posterior communicating arteries. The bilateral ACA and MCA vascular trees are maintained. Posterior circulation: Normal caliber and flow related enhancement of the intracranial vertebral arteries, basilar artery and bilateral posterior cerebral arteries. Anatomic variants: None significant. IMPRESSION: 1. No acute intracranial abnormality. 2. Moderate chronic microvascular ischemic changes of the white matter. 3. Moderate parenchymal volume loss. 4. No evidence of intracranial large vessel occlusion or  significant stenosis. Electronically Signed   By: Pedro Earls M.D.   On: 08/02/2022 17:22   CT HEAD WO CONTRAST  Result Date: 08/02/2022 CLINICAL DATA:  Dizziness EXAM: CT HEAD WITHOUT CONTRAST TECHNIQUE: Contiguous axial images were obtained from the base of the skull through the vertex without intravenous contrast. RADIATION DOSE REDUCTION: This exam was performed according to the departmental dose-optimization program which includes automated exposure control, adjustment of the mA and/or kV according to patient size and/or use of iterative reconstruction technique. COMPARISON:  MRI 01/07/2021, CT 90 09/04/2013 FINDINGS: Brain: No acute territorial infarction, hemorrhage or intracranial mass. Mild atrophy. Chronic small vessel ischemic changes of the white matter. Small chronic basal  ganglial and white matter infarcts. Stable ventricle size Vascular: No hyperdense vessels. Vertebral and carotid vascular calcification Skull: Normal. Negative for fracture or focal lesion. Sinuses/Orbits: No acute finding. Other: None IMPRESSION: 1. No CT evidence for acute intracranial abnormality. 2. Atrophy and chronic small vessel ischemic changes of the white matter. Electronically Signed   By: Donavan Foil M.D.   On: 08/02/2022 16:06   DG Pelvis 1-2 Views  Result Date: 07/09/2022 CLINICAL DATA:  Left-sided buttock pain after fall EXAM: PELVIS - 1-2 VIEW COMPARISON:  CT abdomen and pelvis 12/23/2020 FINDINGS: No definite acute fracture. No dislocation. Chronic posttraumatic deformities about the right superior and inferior pubic rami. Lumbosacral fusion hardware. Dystrophic soft tissue calcification about the right iliac wing. IMPRESSION: No acute fracture or dislocation. Electronically Signed   By: Placido Sou M.D.   On: 07/09/2022 22:41   DG Knee Complete 4 Views Left  Result Date: 07/09/2022 CLINICAL DATA:  Status post fall EXAM: LEFT KNEE - COMPLETE 4+ VIEW; LEFT TIBIA AND FIBULA - 2 VIEW COMPARISON:  None Available. FINDINGS: Left TKA. No radiographic evidence of loosening. No evidence of fracture, dislocation, or joint effusion. No evidence of arthropathy or other focal bone abnormality. Heterotopic ossification about the fibular head. IMPRESSION: No acute fracture or dislocation. Electronically Signed   By: Placido Sou M.D.   On: 07/09/2022 22:38   DG Tibia/Fibula Left  Result Date: 07/09/2022 CLINICAL DATA:  Status post fall EXAM: LEFT KNEE - COMPLETE 4+ VIEW; LEFT TIBIA AND FIBULA - 2 VIEW COMPARISON:  None Available. FINDINGS: Left TKA. No radiographic evidence of loosening. No evidence of fracture, dislocation, or joint effusion. No evidence of arthropathy or other focal bone abnormality. Heterotopic ossification about the fibular head. IMPRESSION: No acute fracture or  dislocation. Electronically Signed   By: Placido Sou M.D.   On: 07/09/2022 22:38    DISCHARGE EXAMINATION: See progress note from earlier today   DISPOSITION: Home  Discharge Instructions     Ambulatory referral to Neurology   Complete by: As directed    An appointment is requested in approximately: 8 weeks for TIA   Call MD for:  difficulty breathing, headache or visual disturbances   Complete by: As directed    Call MD for:  extreme fatigue   Complete by: As directed    Call MD for:  persistant dizziness or light-headedness   Complete by: As directed    Call MD for:  persistant nausea and vomiting   Complete by: As directed    Call MD for:  severe uncontrolled pain   Complete by: As directed    Call MD for:  temperature >100.4   Complete by: As directed    Diet - low sodium heart healthy   Complete by: As directed  Discharge instructions   Complete by: As directed    Please take your medications as prescribed. Home health has been ordered. A referral has been sent to Dr. Rhea Belton office for follow up.  You were cared for by a hospitalist during your hospital stay. If you have any questions about your discharge medications or the care you received while you were in the hospital after you are discharged, you can call the unit and asked to speak with the hospitalist on call if the hospitalist that took care of you is not available. Once you are discharged, your primary care physician will handle any further medical issues. Please note that NO REFILLS for any discharge medications will be authorized once you are discharged, as it is imperative that you return to your primary care physician (or establish a relationship with a primary care physician if you do not have one) for your aftercare needs so that they can reassess your need for medications and monitor your lab values. If you do not have a primary care physician, you can call [phone] for a physician referral.   Increase  activity slowly   Complete by: As directed    No wound care   Complete by: As directed           Allergies as of 08/03/2022       Reactions   Hydromorphone Other (See Comments)   Cognitive changes  "made me unconscious" Dilaudid        Medication List     STOP taking these medications    benzonatate 100 MG capsule Commonly known as: Tessalon Perles   fluticasone 50 MCG/ACT nasal spray Commonly known as: FLONASE       TAKE these medications    amLODipine 5 MG tablet Commonly known as: NORVASC Take 1 tablet (5 mg total) by mouth daily.   aspirin EC 81 MG tablet Take 1 tablet (81 mg total) by mouth daily. Swallow whole.   atorvastatin 40 MG tablet Commonly known as: LIPITOR Take 1 tablet (40 mg total) by mouth daily.   CALCIUM CARBONATE-VITAMIN D3 PO Take 1 tablet by mouth daily   cholecalciferol 1000 units tablet Commonly known as: VITAMIN D Take 1,000 Units by mouth daily.   clindamycin 300 MG capsule Commonly known as: CLEOCIN Take one capsule by mouth three times daily for three more days. What changed:  how much to take how to take this when to take this additional instructions   clopidogrel 75 MG tablet Commonly known as: PLAVIX Take 1 tablet (75 mg total) by mouth daily for 21 days.   denosumab 60 MG/ML Soln injection Commonly known as: PROLIA Inject 60 mg into the skin every 6 (six) months.   docusate sodium 100 MG capsule Commonly known as: COLACE Take 100 mg by mouth daily.   FISH OIL OMEGA-3 PO Take 1 capsule by mouth daily.   furosemide 40 MG tablet Commonly known as: LASIX TAKE 1 TABLET DAILY   gabapentin 300 MG capsule Commonly known as: NEURONTIN Take 1 capsule (300 mg total) by mouth 2 (two) times daily.   guaiFENesin 600 MG 12 hr tablet Commonly known as: MUCINEX Take 1 tablet (600 mg total) by mouth 2 (two) times daily for 14 days.   multivitamin,tx-minerals tablet Take 1 tablet by mouth daily.   oxyCODONE  10 mg 12 hr tablet Commonly known as: OXYCONTIN Take 1 tablet (10 mg total) by mouth every 12 (twelve) hours. What changed: Another medication with the same name was  removed. Continue taking this medication, and follow the directions you see here.   potassium chloride 10 MEQ tablet Commonly known as: KLOR-CON M TAKE 1 TABLET TWICE A DAY   predniSONE 1 MG tablet Commonly known as: DELTASONE TAKE 4 TABLETS DAILY WITH BREAKFAST What changed:  how much to take how to take this when to take this additional instructions   Probiotic (Lactobacillus) Caps Take 1 each by mouth daily.   [place] ARIA IV Inject into the vein. Takes infusion every 8 wks.   vitamin B-12 100 MCG tablet Commonly known as: CYANOCOBALAMIN Take 100 mcg by mouth daily.           TOTAL DISCHARGE TIME: 35 minutes  Oluwaseyi Tull [employer] on [email or web address]  08/04/2022, 10:37 AM

## 2022-08-03 NOTE — Progress Notes (Addendum)
[facility] PROGRESS NOTE   [name] BDZ:[id number] DOB: 05/19/1941 DOA: 08/02/2022  PCP: Eulas Post, MD  Brief History/Interval Summary:  81 y.o. female with medical history significant of breast cancer, pulmonary fibrosis, history of CVA, history of seizure disorder antiepileptics, HTN, ataxia, leg wound chronic pain, GERD polymyalgia rheumatica, rheumatoid arthritis who presented after feeling lightheaded and having difficulty finding words.  Concern was for TIA.  Patient was hospitalized for further management. Of note she has recently been seen by her primary care provider complaining of cough productive of yellow sputum on October 15 she had an accident where her walker hit her leg with hematoma left mid to lateral leg.  There was concern for infection and she has been taking clindamycin for this. She has been followed by wound care for this and has an appointment on 14 November. She has chronic back pain for which she takes OxyContin 10 mg twice daily  Consultants: Neurology  Procedures: Transthoracic echocardiogram and carotid Doppler studies pending    Subjective/Interval History: Patient mentions that she feels well this morning.  Has not had recurrence of her symptoms.  Her symptoms lasted about an hour or so.    Assessment/Plan:  Transient neurological deficits, concern for TIA MRI brain did not show any acute stroke.  Carotid Doppler and echocardiogram is pending. LDL 85.  HbA1c 5.0.  TSH 1.5.  UA does not suggest infection.  Urine drug screen positive for opioids.  No arrhythmia noted on EKG. Await neurology input. Await PT and OT evaluation. Patient appears to be on aspirin and Plavix currently.  Seizure disorder She mentioned that she had a seizure back in 2009.  Has not had any recurrence of this.  Not on any antiepileptics.  Essential hypertension Monitor blood pressures closely.  History of temporal arteritis Followed by neurology.   Noted to be on prednisone on a daily basis.  This will be resumed.  Left leg wound Seen by wound care.  Apparently has an appointment at the wound center in the near future.  Treated with clindamycin for cellulitis.  Being continued.  Initiate probiotics.  GERD PPI  Chronic kidney disease stage II Renal function is stable.  DVT Prophylaxis: SCDs Code Status: Full code Family Communication: Discussed with patient Disposition Plan: To be determined.  Hopefully return home when cleared by neurology.  Status is: Observation The patient remains OBS appropriate and will d/c before 2 midnights.      Medications: Scheduled:  amLODipine  5 mg Oral Daily   aspirin  300 mg Rectal Daily   Or   aspirin  325 mg Oral Daily   atorvastatin  40 mg Oral Daily   clindamycin  300 mg Oral Q8H   [START ON 08/04/2022] clopidogrel  75 mg Oral Daily   gabapentin  300 mg Oral BID   guaiFENesin  600 mg Oral BID   oxyCODONE  10 mg Oral Q12H   Continuous:  sodium chloride 75 mL/hr at 08/03/22 1007   TMH:[id number] **OR** acetaminophen (TYLENOL) oral liquid 160 mg/5 mL **OR** acetaminophen, senna-docusate  Antibiotics: Anti-infectives (From admission, onward)    Start     Dose/Rate Route Frequency Ordered Stop   08/02/22 2200  clindamycin (CLEOCIN) capsule 300 mg        300 mg Oral Every 8 hours 08/02/22 2157 08/04/22 2159   08/02/22 2152  clindamycin (CLEOCIN) capsule 300 mg  Status:  Discontinued       Note to Pharmacy: OP HER:DEYC one capsule  by mouth three times daily for three more days. Patient taking differently: Take one capsule (300 mg) by mouth three times daily for three more days.     300 mg Oral See admin instructions 08/02/22 2152 08/02/22 2157       Objective:  Vital Signs  Vitals:   08/03/22 0830 08/03/22 0900 08/03/22 0930 08/03/22 1000  BP: (!) 139/57 (!) 127/59 117/62 119/66  Pulse: 67 (!) 59 61 64  Resp: 13 (!) '21 15 19  '$ Temp:    97.9 F (36.6 C)  TempSrc:     Oral  SpO2: 96% 95% 97% 97%  Weight:      Height:        Intake/Output Summary (Last 24 hours) at 08/03/2022 1032 Last data filed at 08/03/2022 0809 Gross per 24 hour  Intake 1246.94 ml  Output --  Net 1246.94 ml   Filed Weights   08/02/22 1327  Weight: 58.1 kg    General appearance: Awake alert.  In no distress Resp: Clear to auscultation bilaterally.  Normal effort Cardio: S1-S2 is normal regular.  No S3-S4.  No rubs murmurs or bruit GI: Abdomen is soft.  Nontender nondistended.  Bowel sounds are present normal.  No masses organomegaly Extremities: left leg covered in dressing Neurologic: Alert and oriented x3.  No focal neurological deficits.    Lab Results:  Data Reviewed: I have personally reviewed following labs and reports of the imaging studies  CBC: Recent Labs  Lab 07/30/22 1605 08/02/22 1427  WBC 8.6 8.3  NEUTROABS 5.6 6.4  HGB 13.5 12.9  HCT 41.1 35.6*  MCV 98.3 99.4  PLT 259 295    Basic Metabolic Panel: Recent Labs  Lab 07/30/22 1605 08/02/22 1427 08/02/22 2129  NA 139 141  --   K 4.1 3.7  --   CL 100 103  --   CO2 30 26  --   GLUCOSE 138* 103*  --   BUN 25* 15  --   CREATININE 1.51* 1.29*  --   CALCIUM 10.0 8.1*  --   MG  --  2.2  --   PHOS  --   --  2.0*    GFR: Estimated Creatinine Clearance: 27.5 mL/min (A) (by C-G formula based on SCr of 1.29 mg/dL (H)).  Liver Function Tests: Recent Labs  Lab 07/30/22 1605 08/02/22 1427  AST 22 21  ALT 12 14  ALKPHOS 72 66  BILITOT 0.7 0.9  PROT 7.6 7.0  ALBUMIN 4.2 3.4*     Coagulation Profile: Recent Labs  Lab 08/02/22 2129  INR 1.1    Cardiac Enzymes: Recent Labs  Lab 08/02/22 2129  CKTOTAL 102    HbA1C: Recent Labs    08/03/22 0215  HGBA1C 5.0      Lipid Profile: Recent Labs    08/03/22 0215  CHOL 143  HDL 47  LDLCALC 85  TRIG 56  CHOLHDL 3.0    Thyroid Function Tests: Recent Labs    08/02/22 2129  TSH 1.512     Recent Results (from the past  240 hour(s))  SARS Coronavirus 2 by RT PCR (hospital order, performed in [facility] hospital lab) *cepheid single result test* Anterior Nasal Swab     Status: None   Collection Time: 08/03/22 12:37 AM   Specimen: Anterior Nasal Swab  Result Value Ref Range Status   SARS Coronavirus 2 by RT PCR NEGATIVE NEGATIVE Final    Comment: (NOTE) SARS-CoV-2 target nucleic acids are NOT DETECTED.  The  SARS-CoV-2 RNA is generally detectable in upper and lower respiratory specimens during the acute phase of infection. The lowest concentration of SARS-CoV-2 viral copies this assay can detect is 250 copies / mL. A negative result does not preclude SARS-CoV-2 infection and should not be used as the sole basis for treatment or other patient management decisions.  A negative result may occur with improper specimen collection / handling, submission of specimen other than nasopharyngeal swab, presence of viral mutation(s) within the areas targeted by this assay, and inadequate number of viral copies (<250 copies / mL). A negative result must be combined with clinical observations, patient history, and epidemiological information.  Fact Sheet for Patients:   https://www.patel.info/  Fact Sheet for Healthcare Providers: https://hall.com/  This test is not yet approved or  cleared by the [place] FDA and has been authorized for detection and/or diagnosis of SARS-CoV-2 by FDA under an Emergency Use Authorization (EUA).  This EUA will remain in effect (meaning this test can be used) for the duration of the COVID-19 declaration under Section 564(b)(1) of the Act, 21 U.S.C. section 360bbb-3(b)(1), unless the authorization is terminated or revoked sooner.  Performed at [facility], [place] [address] [place], [place] [address]       Radiology Studies: DG Chest 2 View  Result Date: 08/02/2022 CLINICAL DATA:  TIA workup. EXAM: CHEST - 2 VIEW  COMPARISON:  12/01/2014, 05/02/2017. FINDINGS: The heart size and mediastinal contours are within normal limits. There is atherosclerotic calcification of the aorta. Interstitial prominence is noted in the lungs bilaterally with a basilar predominance. No effusion or pneumothorax. Cervical and thoracolumbar spinal fusion hardware is noted. Degenerative changes are present in the thoracic spine. No acute osseous abnormality. IMPRESSION: 1. No active cardiopulmonary disease. 2. Interstitial prominence predominantly at the lung bases, compatible with known interstitial lung disease. 3. Aortic atherosclerosis. Electronically Signed   By: Brett Fairy M.D.   On: 08/02/2022 21:10   MR BRAIN WO CONTRAST  Result Date: 08/02/2022 CLINICAL DATA:  Transient ischemic attack. EXAM: MRI HEAD WITHOUT CONTRAST MRA HEAD WITHOUT CONTRAST TECHNIQUE: Multiplanar, multi-echo pulse sequences of the brain and surrounding structures were acquired without intravenous contrast. Angiographic images of the Circle of Willis were acquired using MRA technique without intravenous contrast. COMPARISON:  Head CT August 02, 2022; MRI of the brain January 07, 2021. FINDINGS: MRI HEAD FINDINGS The study is degraded by motion. Brain: No acute infarction, hemorrhage, hydrocephalus, extra-axial collection or mass lesion. Scattered and confluent foci of T2 hyperintensity are seen within the white matter of the cerebral hemispheres and within the pons, nonspecific, most likely related to chronic small vessel ischemia. Moderate parenchymal volume loss. Foci of susceptibility artifact in the anterior right parietal lobe, similar to prior MRI. A new focus of susceptibility artifact is noted in the right occipital lobe. Vascular: Normal flow voids. Skull and upper cervical spine: Retro odontoid soft tissue thickening may be related to rheumatoid arthritis versus CPPD. Susceptibility artifact from cervical spine fusion. No focal marrow lesion identified.  Sinuses/Orbits: No acute or significant finding. Other: None. MRA HEAD FINDINGS Anterior circulation: Normal flow related enhancement of the visualized upper cervical internal carotid arteries and intracranial internal carotid arteries noting increased tortuosity of left ICA just below the skull base. Bilateral infundibular lie are noted at the origin of the posterior communicating arteries. The bilateral ACA and MCA vascular trees are maintained. Posterior circulation: Normal caliber and flow related enhancement of the intracranial vertebral arteries, basilar artery and bilateral posterior  cerebral arteries. Anatomic variants: None significant. IMPRESSION: 1. No acute intracranial abnormality. 2. Moderate chronic microvascular ischemic changes of the white matter. 3. Moderate parenchymal volume loss. 4. No evidence of intracranial large vessel occlusion or significant stenosis. Electronically Signed   By: Pedro Earls M.D.   On: 08/02/2022 17:22   MR ANGIO HEAD WO CONTRAST  Result Date: 08/02/2022 CLINICAL DATA:  Transient ischemic attack. EXAM: MRI HEAD WITHOUT CONTRAST MRA HEAD WITHOUT CONTRAST TECHNIQUE: Multiplanar, multi-echo pulse sequences of the brain and surrounding structures were acquired without intravenous contrast. Angiographic images of the Circle of Willis were acquired using MRA technique without intravenous contrast. COMPARISON:  Head CT August 02, 2022; MRI of the brain January 07, 2021. FINDINGS: MRI HEAD FINDINGS The study is degraded by motion. Brain: No acute infarction, hemorrhage, hydrocephalus, extra-axial collection or mass lesion. Scattered and confluent foci of T2 hyperintensity are seen within the white matter of the cerebral hemispheres and within the pons, nonspecific, most likely related to chronic small vessel ischemia. Moderate parenchymal volume loss. Foci of susceptibility artifact in the anterior right parietal lobe, similar to prior MRI. A new focus of  susceptibility artifact is noted in the right occipital lobe. Vascular: Normal flow voids. Skull and upper cervical spine: Retro odontoid soft tissue thickening may be related to rheumatoid arthritis versus CPPD. Susceptibility artifact from cervical spine fusion. No focal marrow lesion identified. Sinuses/Orbits: No acute or significant finding. Other: None. MRA HEAD FINDINGS Anterior circulation: Normal flow related enhancement of the visualized upper cervical internal carotid arteries and intracranial internal carotid arteries noting increased tortuosity of left ICA just below the skull base. Bilateral infundibular lie are noted at the origin of the posterior communicating arteries. The bilateral ACA and MCA vascular trees are maintained. Posterior circulation: Normal caliber and flow related enhancement of the intracranial vertebral arteries, basilar artery and bilateral posterior cerebral arteries. Anatomic variants: None significant. IMPRESSION: 1. No acute intracranial abnormality. 2. Moderate chronic microvascular ischemic changes of the white matter. 3. Moderate parenchymal volume loss. 4. No evidence of intracranial large vessel occlusion or significant stenosis. Electronically Signed   By: Pedro Earls M.D.   On: 08/02/2022 17:22   CT HEAD WO CONTRAST  Result Date: 08/02/2022 CLINICAL DATA:  Dizziness EXAM: CT HEAD WITHOUT CONTRAST TECHNIQUE: Contiguous axial images were obtained from the base of the skull through the vertex without intravenous contrast. RADIATION DOSE REDUCTION: This exam was performed according to the departmental dose-optimization program which includes automated exposure control, adjustment of the mA and/or kV according to patient size and/or use of iterative reconstruction technique. COMPARISON:  MRI 01/07/2021, CT 90 09/04/2013 FINDINGS: Brain: No acute territorial infarction, hemorrhage or intracranial mass. Mild atrophy. Chronic small vessel ischemic changes  of the white matter. Small chronic basal ganglial and white matter infarcts. Stable ventricle size Vascular: No hyperdense vessels. Vertebral and carotid vascular calcification Skull: Normal. Negative for fracture or focal lesion. Sinuses/Orbits: No acute finding. Other: None IMPRESSION: 1. No CT evidence for acute intracranial abnormality. 2. Atrophy and chronic small vessel ischemic changes of the white matter. Electronically Signed   By: Donavan Foil M.D.   On: 08/02/2022 16:06       LOS: 0 days   [facility] Pager on [email or web address]  08/03/2022, 10:32 AM

## 2022-08-03 NOTE — Consult Note (Signed)
WOC Nurse Consult Note: Reason for Consult:Wound care to chronic, healing Left anterior LE wound. Past seen by provider Dr. Girtha Hake on 08/01/22. Wound type:trauma (07/08/22) Pressure Injury POA: N/A Measurement:On 08/01/22, 6c mx 7.5cm x 0.2cm  Wound bed:red, moist Drainage (amount, consistency, odor) scant Periwound:intact Dressing procedure/placement/frequency:This wound has ben healing well with conservative topical therapy and I will continue with that POC here using a daily soap and water cleanse, rinse and pat dry followed by placement of an antimicrobial nonadherent (xeroform). This is to be topped with an ABD pad for comport and the LE wrapped from just below toes to just below knee with a Kerlix roll gauze/paper tape. The Kerlix is to be topped with an ACE bandage applied in a similar manner for edema management, enhancement of venous return, and promotion of wound healing. Heels are to be floated. A sacral foam is to be placed for prophylactic pressure injury prevention. A pressure redistribution chair cushion is provided for her use while OOB in a chair and is to be sent home with her upon discharge.  [place] nursing team will not follow, but will remain available to this patient, the nursing and medical teams.  Please re-consult if needed.  Thank you for inviting [place] to participate in this patient's Plan of Care.  Maudie Flakes, MSN, RN, CNS, [place], Serita Grammes, [employer], [employer] phone:  [phone]

## 2022-08-03 NOTE — ED Notes (Signed)
Pt wound to lt lower leg dressed with zeroform, 4x4, kerlex and koban per admit provider request. Once done pt moved to room 40

## 2022-08-03 NOTE — Progress Notes (Addendum)
STROKE TEAM PROGRESS NOTE   INTERVAL HISTORY No family at the bedside.  She is alert, awake and oriented. Moves all extremities, 5/5. No ataxia. NO facial droop. No slurred speech or aphasia.  She states she feels like she is back to baseline.  MRI scan of the brain shows no acute abnormality and MRI of the brain also shows no large vessel stenosis or occlusion. Vitals:   08/03/22 0500 08/03/22 0530 08/03/22 0600 08/03/22 0701  BP: (!) 136/119 (!) 104/55 124/66   Pulse: (!) 55 (!) 57 (!) 57   Resp: '18 20 19   '$ Temp:    97.8 F (36.6 C)  TempSrc:      SpO2: 94% 92% 98%   Weight:      Height:       CBC:  Recent Labs  Lab 07/30/22 1605 08/02/22 1427  WBC 8.6 8.3  NEUTROABS 5.6 6.4  HGB 13.5 12.9  HCT 41.1 35.6*  MCV 98.3 99.4  PLT 259 970   Basic Metabolic Panel:  Recent Labs  Lab 07/30/22 1605 08/02/22 1427 08/02/22 2129  NA 139 141  --   K 4.1 3.7  --   CL 100 103  --   CO2 30 26  --   GLUCOSE 138* 103*  --   BUN 25* 15  --   CREATININE 1.51* 1.29*  --   CALCIUM 10.0 8.1*  --   MG  --  2.2  --   PHOS  --   --  2.0*   Lipid Panel:  Recent Labs  Lab 08/03/22 0215  CHOL 143  TRIG 56  HDL 47  CHOLHDL 3.0  VLDL 11  LDLCALC 85   HgbA1c:  Recent Labs  Lab 08/03/22 0215  HGBA1C 5.0   Urine Drug Screen:  Recent Labs  Lab 08/02/22 1534  LABOPIA POSITIVE*  COCAINSCRNUR NONE DETECTED  LABBENZ NONE DETECTED  AMPHETMU NONE DETECTED  THCU NONE DETECTED  LABBARB NONE DETECTED    Alcohol Level No results for input(s): "ETH" in the last 168 hours.  IMAGING past 24 hours DG Chest 2 View  Result Date: 08/02/2022 CLINICAL DATA:  TIA workup. EXAM: CHEST - 2 VIEW COMPARISON:  12/01/2014, 05/02/2017. FINDINGS: The heart size and mediastinal contours are within normal limits. There is atherosclerotic calcification of the aorta. Interstitial prominence is noted in the lungs bilaterally with a basilar predominance. No effusion or pneumothorax. Cervical and  thoracolumbar spinal fusion hardware is noted. Degenerative changes are present in the thoracic spine. No acute osseous abnormality. IMPRESSION: 1. No active cardiopulmonary disease. 2. Interstitial prominence predominantly at the lung bases, compatible with known interstitial lung disease. 3. Aortic atherosclerosis. Electronically Signed   By: Brett Fairy M.D.   On: 08/02/2022 21:10   MR BRAIN WO CONTRAST  Result Date: 08/02/2022 CLINICAL DATA:  Transient ischemic attack. EXAM: MRI HEAD WITHOUT CONTRAST MRA HEAD WITHOUT CONTRAST TECHNIQUE: Multiplanar, multi-echo pulse sequences of the brain and surrounding structures were acquired without intravenous contrast. Angiographic images of the Circle of Willis were acquired using MRA technique without intravenous contrast. COMPARISON:  Head CT August 02, 2022; MRI of the brain January 07, 2021. FINDINGS: MRI HEAD FINDINGS The study is degraded by motion. Brain: No acute infarction, hemorrhage, hydrocephalus, extra-axial collection or mass lesion. Scattered and confluent foci of T2 hyperintensity are seen within the white matter of the cerebral hemispheres and within the pons, nonspecific, most likely related to chronic small vessel ischemia. Moderate parenchymal volume loss. Foci of susceptibility  artifact in the anterior right parietal lobe, similar to prior MRI. A new focus of susceptibility artifact is noted in the right occipital lobe. Vascular: Normal flow voids. Skull and upper cervical spine: Retro odontoid soft tissue thickening may be related to rheumatoid arthritis versus CPPD. Susceptibility artifact from cervical spine fusion. No focal marrow lesion identified. Sinuses/Orbits: No acute or significant finding. Other: None. MRA HEAD FINDINGS Anterior circulation: Normal flow related enhancement of the visualized upper cervical internal carotid arteries and intracranial internal carotid arteries noting increased tortuosity of left ICA just below the skull  base. Bilateral infundibular lie are noted at the origin of the posterior communicating arteries. The bilateral ACA and MCA vascular trees are maintained. Posterior circulation: Normal caliber and flow related enhancement of the intracranial vertebral arteries, basilar artery and bilateral posterior cerebral arteries. Anatomic variants: None significant. IMPRESSION: 1. No acute intracranial abnormality. 2. Moderate chronic microvascular ischemic changes of the white matter. 3. Moderate parenchymal volume loss. 4. No evidence of intracranial large vessel occlusion or significant stenosis. Electronically Signed   By: Pedro Earls M.D.   On: 08/02/2022 17:22   MR ANGIO HEAD WO CONTRAST  Result Date: 08/02/2022 CLINICAL DATA:  Transient ischemic attack. EXAM: MRI HEAD WITHOUT CONTRAST MRA HEAD WITHOUT CONTRAST TECHNIQUE: Multiplanar, multi-echo pulse sequences of the brain and surrounding structures were acquired without intravenous contrast. Angiographic images of the Circle of Willis were acquired using MRA technique without intravenous contrast. COMPARISON:  Head CT August 02, 2022; MRI of the brain January 07, 2021. FINDINGS: MRI HEAD FINDINGS The study is degraded by motion. Brain: No acute infarction, hemorrhage, hydrocephalus, extra-axial collection or mass lesion. Scattered and confluent foci of T2 hyperintensity are seen within the white matter of the cerebral hemispheres and within the pons, nonspecific, most likely related to chronic small vessel ischemia. Moderate parenchymal volume loss. Foci of susceptibility artifact in the anterior right parietal lobe, similar to prior MRI. A new focus of susceptibility artifact is noted in the right occipital lobe. Vascular: Normal flow voids. Skull and upper cervical spine: Retro odontoid soft tissue thickening may be related to rheumatoid arthritis versus CPPD. Susceptibility artifact from cervical spine fusion. No focal marrow lesion identified.  Sinuses/Orbits: No acute or significant finding. Other: None. MRA HEAD FINDINGS Anterior circulation: Normal flow related enhancement of the visualized upper cervical internal carotid arteries and intracranial internal carotid arteries noting increased tortuosity of left ICA just below the skull base. Bilateral infundibular lie are noted at the origin of the posterior communicating arteries. The bilateral ACA and MCA vascular trees are maintained. Posterior circulation: Normal caliber and flow related enhancement of the intracranial vertebral arteries, basilar artery and bilateral posterior cerebral arteries. Anatomic variants: None significant. IMPRESSION: 1. No acute intracranial abnormality. 2. Moderate chronic microvascular ischemic changes of the white matter. 3. Moderate parenchymal volume loss. 4. No evidence of intracranial large vessel occlusion or significant stenosis. Electronically Signed   By: Pedro Earls M.D.   On: 08/02/2022 17:22   CT HEAD WO CONTRAST  Result Date: 08/02/2022 CLINICAL DATA:  Dizziness EXAM: CT HEAD WITHOUT CONTRAST TECHNIQUE: Contiguous axial images were obtained from the base of the skull through the vertex without intravenous contrast. RADIATION DOSE REDUCTION: This exam was performed according to the departmental dose-optimization program which includes automated exposure control, adjustment of the mA and/or kV according to patient size and/or use of iterative reconstruction technique. COMPARISON:  MRI 01/07/2021, CT 90 09/04/2013 FINDINGS: Brain: No acute territorial infarction, hemorrhage  or intracranial mass. Mild atrophy. Chronic small vessel ischemic changes of the white matter. Small chronic basal ganglial and white matter infarcts. Stable ventricle size Vascular: No hyperdense vessels. Vertebral and carotid vascular calcification Skull: Normal. Negative for fracture or focal lesion. Sinuses/Orbits: No acute finding. Other: None IMPRESSION: 1. No CT  evidence for acute intracranial abnormality. 2. Atrophy and chronic small vessel ischemic changes of the white matter. Electronically Signed   By: Donavan Foil M.D.   On: 08/02/2022 16:06    PHYSICAL EXAM  Temp:  [97.8 F (36.6 C)-98.6 F (37 C)] 97.9 F (36.6 C) (11/10 1000) Pulse Rate:  [54-79] 64 (11/10 1000) Resp:  [13-28] 19 (11/10 1000) BP: (100-154)/(52-119) 119/66 (11/10 1000) SpO2:  [91 %-100 %] 97 % (11/10 1000) Weight:  [58.1 kg] 58.1 kg (11/09 1327)  General - Frail elderly Caucasian female Well nourished, well developed, in no apparent distress.  Cardiovascular - Regular rhythm and rate.  Mental Status -  Level of arousal and orientation to time, place, and person were intact. Language including expression, naming, repetition, comprehension was assessed and found intact. Attention span and concentration were normal. Recent and remote memory were intact. Fund of Knowledge was assessed and was intact.  Cranial Nerves II - XII - II - Visual field intact OU. III, IV, VI - Extraocular movements intact. V - Facial sensation intact bilaterally. VII - Facial movement intact bilaterally. VIII - Hearing & vestibular intact bilaterally. X - Palate elevates symmetrically. XI - Chin turning & shoulder shrug intact bilaterally. XII - Tongue protrusion intact.  Motor Strength - The patient's strength was normal in all extremities and pronator drift was absent.  Bulk was normal and fasciculations were absent.   Motor Tone - Muscle tone was assessed at the neck and appendages and was normal..  Sensory - Light touch, temperature/pinprick were assessed and were symmetrical.    Coordination - The patient had normal movements in the hands and feet with no ataxia or dysmetria.  Tremor was absent.  Gait and Station - deferred.  ASSESSMENT/PLAN [name] is a 81 y.o. female with history of  breast cancer pulmonary fibrosis history of CVA history of seizure disorder HTN  ataxia, leg wound chronic pain, GERD polymyalgia rheumatica, rheumatoid arthritis who presented with  lightheaded and difficulty finding words   Posterior circulation TIA:   Code Stroke CT head  1. No CT evidence for acute intracranial abnormality. 2. Atrophy and chronic small vessel ischemic changes of the white matter.   MRI /MRA   1. No acute intracranial abnormality. 2. Moderate chronic microvascular ischemic changes of the white matter. 3. Moderate parenchymal volume loss. 4. No evidence of intracranial large vessel occlusion or significant stenosis. Carotid Doppler no significant bilateral extracranial stenosis.   2D Echo ejection fraction 65 to 70%.  LDL 85 HgbA1c 5.0 VTE prophylaxis - SCD's     Diet   Diet NPO time specified   No antithrombotic prior to admission, now on aspirin 81 mg daily and clopidogrel 75 mg daily. For 3 weeks than ASA alone  Therapy recommendations: Home health PT Disposition: Home  hypertension Home meds:  norvasc,  Stable Permissive hypertension (OK if < 220/120) but gradually normalize in 5-7 days Long-term BP goal normotensive  Hyperlipidemia Home meds:  none LDL 85, goal < 70 Add atorvastatin '40mg'$    Continue statin at discharge  Other Stroke Risk Factors Advanced Age >/= 69  Hx stroke/TIA Migraines  Hospital day # 0  Beulah Gandy  DNP, ACNPC-AG    STROKE MD NOTE :  I have personally obtained history,examined this patient, reviewed notes, independently viewed imaging studies, participated in medical decision making and plan of care.ROS completed by me personally and pertinent positives fully documented  I have made any additions or clarifications directly to the above note. Agree with note above.  She presented with transient episode of dizziness, imbalance, slurred speech and right upper extremity heaviness and weakness likely from posterior circulation TIA from small vessel disease.  Continue ongoing stroke work-up.  Recommend aspirin  and Plavix for 3 weeks followed by aspirin alone and aggressive risk factor modification.  Statin for elevated lipids.  Discussed with patient and Dr. Maryland Pink.  Greater than 50% time during this 50-minute visit was spent in counseling and coordination of care about TIA and discussion about stroke evaluation, prevention and treatment and answering questions.  Follow-up as an outpatient stroke clinic in 2 months with nurse practitioner.  Kindly call for questions.  Antony Contras, MD Medical Director [facility] Pager: [phone] 08/03/2022 3:39 PM  To contact Stroke Continuity provider, please refer to [email or web address] After hours, contact General Neurology

## 2022-08-03 NOTE — ED Notes (Signed)
Verbal report given to Crystal B RN at this time

## 2022-08-03 NOTE — Progress Notes (Incomplete)
  Echocardiogram 2D Echocardiogram has been performed.  [name] 08/03/2022, 12:45 PM

## 2022-08-05 ENCOUNTER — Telehealth: Payer: Self-pay

## 2022-08-05 NOTE — Telephone Encounter (Signed)
Patient called in to ask about medicine. Has concerns about lipitor and plavix. Encouraged to follow up Monday with PCP

## 2022-08-07 ENCOUNTER — Telehealth: Payer: Self-pay | Admitting: Family Medicine

## 2022-08-07 ENCOUNTER — Encounter (HOSPITAL_BASED_OUTPATIENT_CLINIC_OR_DEPARTMENT_OTHER): Payer: Medicare Other | Attending: General Surgery | Admitting: General Surgery

## 2022-08-07 ENCOUNTER — Telehealth: Payer: Self-pay | Admitting: Neurology

## 2022-08-07 ENCOUNTER — Encounter: Payer: Self-pay | Admitting: Neurology

## 2022-08-07 ENCOUNTER — Ambulatory Visit (INDEPENDENT_AMBULATORY_CARE_PROVIDER_SITE_OTHER): Payer: Medicare Other | Admitting: Neurology

## 2022-08-07 VITALS — BP 153/74 | HR 87 | Ht 61.0 in | Wt 130.0 lb

## 2022-08-07 DIAGNOSIS — Z7901 Long term (current) use of anticoagulants: Secondary | ICD-10-CM | POA: Insufficient documentation

## 2022-08-07 DIAGNOSIS — L97822 Non-pressure chronic ulcer of other part of left lower leg with fat layer exposed: Secondary | ICD-10-CM | POA: Diagnosis not present

## 2022-08-07 DIAGNOSIS — L84 Corns and callosities: Secondary | ICD-10-CM | POA: Diagnosis not present

## 2022-08-07 DIAGNOSIS — R410 Disorientation, unspecified: Secondary | ICD-10-CM | POA: Diagnosis not present

## 2022-08-07 DIAGNOSIS — N182 Chronic kidney disease, stage 2 (mild): Secondary | ICD-10-CM | POA: Diagnosis not present

## 2022-08-07 DIAGNOSIS — I129 Hypertensive chronic kidney disease with stage 1 through stage 4 chronic kidney disease, or unspecified chronic kidney disease: Secondary | ICD-10-CM | POA: Insufficient documentation

## 2022-08-07 DIAGNOSIS — L97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: Secondary | ICD-10-CM | POA: Diagnosis not present

## 2022-08-07 DIAGNOSIS — G63 Polyneuropathy in diseases classified elsewhere: Secondary | ICD-10-CM | POA: Diagnosis not present

## 2022-08-07 DIAGNOSIS — M316 Other giant cell arteritis: Secondary | ICD-10-CM | POA: Diagnosis not present

## 2022-08-07 DIAGNOSIS — Z7952 Long term (current) use of systemic steroids: Secondary | ICD-10-CM | POA: Insufficient documentation

## 2022-08-07 DIAGNOSIS — G6289 Other specified polyneuropathies: Secondary | ICD-10-CM | POA: Diagnosis not present

## 2022-08-07 DIAGNOSIS — I872 Venous insufficiency (chronic) (peripheral): Secondary | ICD-10-CM | POA: Diagnosis not present

## 2022-08-07 DIAGNOSIS — L97522 Non-pressure chronic ulcer of other part of left foot with fat layer exposed: Secondary | ICD-10-CM | POA: Diagnosis not present

## 2022-08-07 DIAGNOSIS — M069 Rheumatoid arthritis, unspecified: Secondary | ICD-10-CM | POA: Insufficient documentation

## 2022-08-07 NOTE — Progress Notes (Signed)
[name], [name] ([id number]) [id number] Page 1 of 2 Visit Report for 08/07/2022 Chief Complaint Document Details Patient Name: Date of Service: [name], [name] 08/07/2022 9:00 A M Medical Record Number: [id number] Patient Account Number: [id number] Date of Birth/Sex: Treating RN: 11-26-1940 (81 y.o. F) Primary Care Provider: Carolann Littler Other Clinician: Referring Provider: Treating Provider/Extender: [name] in Treatment: 0 Information Obtained from: Patient Chief Complaint Patient seen for complaints of Non-Healing Wound. Electronic Signature(s) Signed: 08/07/2022 8:39:14 AM By: Fredirick Maudlin MD FACS Entered By: Fredirick Maudlin on 08/07/2022 08:39:14 -------------------------------------------------------------------------------- HPI Details Patient Name: Date of Service: [name], [place] RO [name] 08/07/2022 9:00 A M Medical Record Number: [id number] Patient Account Number: [id number] Date of Birth/Sex: Treating RN: [birth year] (81 y.o. F) Primary Care Provider: Carolann Littler Other Clinician: Referring Provider: Treating Provider/Extender: [name] in Treatment: 0 History of Present Illness HPI Description: ADMISSION 11.14.2023 This is an 81 year old woman on chronic steroids and anticoagulants who fell near the beginning of October. She initially developed a hematoma on her left lower leg which subsequently ruptured. She presented to the emergency department on November 6 with concern for a wound infection due to developing erythema and warmth. The wound was debrided in the ED and clindamycin prescribed along with a recommendation for probiotics. Wet-to-dry dressings were prescribed, as well. She was seen again in the emergency room 3 days later for other symptoms. Photographs in the electronic medical record show that the wound is quite a bit cleaner. Wound care was  consulted at that time. They recommended daily soap and water cleanse and Xeroform topped with an ABD and a Kerlix and Ace wrap. Upon her discharge from the hospital, she was referred to the wound care center for further evaluation and management. Electronic Signature(s) Signed: 08/07/2022 8:43:05 AM By: Fredirick Maudlin MD FACS Entered By: Fredirick Maudlin on 08/07/2022 08:43:05 -------------------------------------------------------------------------------- Problem List Details Patient Name: Date of Service: [name], [place] RO [name] 08/07/2022 9:00 A M Medical Record Number: [id number] Patient Account Number: [id number] Date of Birth/Sex: Treating RN: 29-Aug-1941 (81 y.o. Ly Wass, [name] ([id number]) [id number] Page 2 of 2 Primary Care Provider: Carolann Littler Other Clinician: Referring Provider: Treating Provider/Extender: [name] in Treatment: 0 Active Problems ICD-10 Encounter Code Description Active Date MDM Diagnosis [id number] Non-pressure chronic ulcer of other part of left lower leg with fat layer exposed11/14/2023 No Yes G63 Polyneuropathy in diseases classified elsewhere 08/07/2022 No Yes N18.2 Chronic kidney disease, stage 2 (mild) 08/07/2022 No Yes M06.9 Rheumatoid arthritis, unspecified 08/07/2022 No Yes I10 Essential (primary) hypertension 08/07/2022 No Yes Z79.52 Long term (current) use of systemic steroids 08/07/2022 No Yes Inactive Problems Resolved Problems Electronic Signature(s) Signed: 08/07/2022 8:39:01 AM By: Fredirick Maudlin MD FACS Entered By: Fredirick Maudlin on 08/07/2022 08:39:01

## 2022-08-07 NOTE — Telephone Encounter (Signed)
[name] has accepted pt, they will contact her to set up services.

## 2022-08-07 NOTE — Telephone Encounter (Signed)
[name] with [facility]   [phone] Ref # [id number]  Clinical concerns with Pt taking both of these medications together: oxyCODONE (OXYCONTIN) 10 mg 12 hr tablet  gabapentin (NEURONTIN) 300 MG capsule

## 2022-08-07 NOTE — Progress Notes (Signed)
Chief Complaint  Patient presents with   Follow-up    RM 19, with friend. May have had a TIA. Never started lipitor or plavix.      ASSESSMENT AND PLAN  [name] is a 81 y.o. female   Transient loss of consciousness on August 02, 2022  In the setting of active left lower extremity infection, antibiotic treatment, decreased p.o. intake,  Worrisome for syncope episode, due to low volume, orthostatic blood pressure changes,  MRI of the brain showed moderate small vessel disease, atrophy, no acute abnormalities,  Reported a history of seizure many years ago, was not treated with anti-epileptic medications, repeat EEG  Advised patient no driving until episode free for 6 months Increased gait abnormality, Also reported mild cognitive impairment,  Had Lengthy discussion with patient and friend [name], suggested her to stay in a supervised environment,  Arrange home care for physical therapy, left lower extremity wound care, social worker    Return To Clinic With NP In 3 Months   DIAGNOSTIC DATA (LABS, IMAGING, TESTING) - I reviewed patient records, labs, notes, testing and imaging myself where available. CXR on Aug 02 2022 1. No active cardiopulmonary disease. 2. Interstitial prominence predominantly at the lung bases, compatible with known interstitial lung disease. 3. Aortic atherosclerosis.  Lab in Nov 2023: CMP, creat 1.51, CBC, Hg 13.5, TSH, CPK, Troponin, A1C 5.0,     MEDICAL HISTORY:  [name] is a 81 year old female, accompanied by her l longtime friend [name] to follow-up on her recent hospital admission for passing out episode,  I reviewed and summarized the referring note. PMHX. 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 [name] in the past, temporal arteritis, with polymyalgia rheumatica, with sudden onset of right visual loss, she has been treated with  prednisone for a long time, gradually tapering dose, currently taking 4 mg daily, reported relapse of body achy pain if the dosage tapering down to below 3 mg daily  She also has rheumatoid arthritis, gait abnormality, documented using walker as far back in 2014, bilateral lower extremity neuropathic pain, required variable dose of neuropathic pain medications  At baseline, she lives independently alone at her house, drive, ambulate with walker, her only son is a physician at [employer], Per [name], is in the process of moving patient to a facility to be closer to him  Hospital admission from November 9 to 10, 2023, around 10 AM August 02, 2022, she woke up, went to the bathroom to urinate, was able to wash her hands afterwards, while washing her hands, she felt lightheaded, dizzy," her brain feels so big", she took a quick steps to her chair, slumped into the chair, apparently lost consciousness for about 2 hours, she came around calling her friend [name], was noted to have slurred speech, not herself, [name] urged her to call ambulance, was taken to the hospital  Prior to the incident, she has suffered left lower extremity bleeding, infection due to recent fall accident, she was taking antibiotics, did not have good appetite, was not able to keep up her fluid intake, was seen by wound care, pain medication OxyContin 10 mg twice a day  At the hospital, ultrasound of carotid artery showed no significant stenosis  Echocardiogram showed normal ejection fraction  LDL 85 A1c 5.0, TSH within normal limit, UA showed no evidence of infection, EKG showed no significant abnormality  Personally reviewed MRI of  the brain no acute intracranial abnormality, moderate small vessel disease, moderate atrophy  MRA of the brain showed no large vessel disease,  Worried about the TIA, she was given the prescription of Lipitor combination of aspirin and Plavix upon discharge, she has not taking Lipitor and  Plavix,  Also reported a history of seizure, few years ago, was found by family on the floor, no antiepileptic medications    PHYSICAL EXAM:   Vitals:   08/07/22 1337  BP: (!) 153/74  Pulse: 87  Weight: 130 lb (59 kg)  Height: '5\' 1"'$  (1.549 m)   Not recorded     Body mass index is 24.56 kg/m.  PHYSICAL EXAMNIATION:  Gen: NAD, conversant, well nourised, well groomed                     Cardiovascular: Regular rate rhythm, no peripheral edema, warm, nontender. Eyes: Conjunctivae clear without exudates or hemorrhage Neck: Supple, no carotid bruits. Pulmonary: Clear to auscultation bilaterally   NEUROLOGICAL EXAM:  MENTAL STATUS: Speech/cognition: Awake, alert, oriented to history taking and casual conversation CRANIAL NERVES: CN II: Visual fields are full to confrontation. Pupils are round equal and briskly reactive to light. CN III, IV, VI: extraocular movement are normal. No ptosis. CN V: Facial sensation is intact to light touch CN VII: Face is symmetric with normal eye closure  CN VIII: Hearing is normal to causal conversation. CN IX, X: Phonation is normal. CN XI: Head turning and shoulder shrug are intact  MOTOR: Significant deformity of bilateral hands joints, no significant upper extremity proximal and distal muscle weakness, lower extremity motor strength testing is limited because of her joints pain, and left anterior shin pain, bilateral proximal and distal muscle strength is at least 4 out of 5  REFLEXES: Hypoactive and symmetric SENSORY: Length dependent decreased light touch, vibratory sensation to mid shin level  COORDINATION: There is no trunk or limb dysmetria noted.  GAIT/STANCE: With effort she can get up from seated position arm crossed, scoliosis, left knee valgus, unsteady, rely on her cane  REVIEW OF SYSTEMS:  Full 14 system review of systems performed and notable only for as above All other review of systems were  negative.   ALLERGIES: Allergies  Allergen Reactions   Hydromorphone Other (See Comments)    Cognitive changes  "made me unconscious" Dilaudid    HOME MEDICATIONS: Current Outpatient Medications  Medication Sig Dispense Refill   amLODipine (NORVASC) 5 MG tablet Take 1 tablet (5 mg total) by mouth daily. 90 tablet 3   aspirin EC 81 MG tablet Take 1 tablet (81 mg total) by mouth daily. Swallow whole. 30 tablet 11   Calcium Carb-Cholecalciferol (CALCIUM CARBONATE-VITAMIN D3 PO) Take 1 tablet by mouth daily     cholecalciferol (VITAMIN D) 1000 UNITS tablet Take 1,000 Units by mouth daily.     clindamycin (CLEOCIN) 300 MG capsule Take one capsule by mouth three times daily for three more days. (Patient taking differently: Take 300 mg by mouth See admin instructions. Take one capsule (300 mg) by mouth three times daily for three more days.) 9 capsule 0   clopidogrel (PLAVIX) 75 MG tablet Take 1 tablet (75 mg total) by mouth daily for 21 days. 21 tablet 0   denosumab (PROLIA) 60 MG/ML SOLN injection Inject 60 mg into the skin every 6 (six) months.     docusate sodium (COLACE) 100 MG capsule Take 100 mg by mouth daily.     furosemide (LASIX) 40 MG  tablet TAKE 1 TABLET DAILY 90 tablet 3   gabapentin (NEURONTIN) 300 MG capsule Take 1 capsule (300 mg total) by mouth 2 (two) times daily. 360 capsule 0   Golimumab (SIMPONI ARIA IV) Inject into the vein. Takes infusion every 8 wks.     guaiFENesin (MUCINEX) 600 MG 12 hr tablet Take 1 tablet (600 mg total) by mouth 2 (two) times daily for 14 days. 28 tablet 0   Multiple Vitamins-Minerals (MULTIVITAMIN,TX-MINERALS) tablet Take 1 tablet by mouth daily.     Omega-3 Fatty Acids (FISH OIL OMEGA-3 PO) Take 1 capsule by mouth daily.     oxyCODONE (OXYCONTIN) 10 mg 12 hr tablet Take 1 tablet (10 mg total) by mouth every 12 (twelve) hours. 60 tablet 0   potassium chloride (KLOR-CON M) 10 MEQ tablet TAKE 1 TABLET TWICE A DAY 180 tablet 0   predniSONE  (DELTASONE) 1 MG tablet TAKE 4 TABLETS DAILY WITH BREAKFAST (Patient taking differently: Take 4 mg by mouth daily with breakfast.) 360 tablet 3   Probiotic, Lactobacillus, CAPS Take 1 each by mouth daily. 30 capsule 0   vitamin B-12 (CYANOCOBALAMIN) 100 MCG tablet Take 100 mcg by mouth daily.     atorvastatin (LIPITOR) 40 MG tablet Take 1 tablet (40 mg total) by mouth daily. (Patient not taking: Reported on 08/07/2022) 30 tablet 2   No current facility-administered medications for this visit.    PAST MEDICAL HISTORY: Past Medical History:  Diagnosis Date   Allergic rhinitis    Anemia    Anxiety    Bronchiectasis    Carpal tunnel syndrome 07/13/2015   Bilateral   CVA (cerebral infarction) 10/2007   Right thalamic    Diverticulosis of colon    Gait disorder    GERD (gastroesophageal reflux disease)    History of breast cancer    HTN (hypertension)    Hyperlipidemia    Idiopathic pulmonary fibrosis    Left knee DJD    Migraine    Osteoporosis    Peripheral neuropathy    PMR (polymyalgia rheumatica) (HCC)    Polyneuropathy in other diseases classified elsewhere ([place]) 02/17/2013   Previous back surgery 10/09/12   Renal insufficiency    Rheumatoid arthritis (HCC)    Seizure disorder (HCC)    Spondylosis, cervical, with myelopathy 08/31/2015   C3-4 myelopathy   Temporal arteritis (HCC)    Right eye blind, on steroids per [facility]/ Dr [name]   Type II or unspecified type diabetes mellitus without mention of complication, not stated as uncontrolled    2nd to steriods   Vitamin D deficiency     PAST SURGICAL HISTORY: Past Surgical History:  Procedure Laterality Date   APPENDECTOMY  01/2021   CARPAL TUNNEL RELEASE Right    CATARACT EXTRACTION     OS - Summer 2010   CERVICAL FUSION  09/24/2008   4 rods and pins in place   CHOLECYSTECTOMY     COLONOSCOPY  12/15/2011   Procedure: COLONOSCOPY;  Surgeon: [name], MD;  Location: [facility];  Service: Endoscopy;  Laterality:  N/A;   LUMBAR FUSION  04/24/2010   W/Mechanical fixation - [facility]   LUMBAR FUSION  09/25/2011   rods in hips (to stabilize).   MASTECTOMY     NECK SURGERY     rheumatoid nodule removal     SPINAL FUSION  07/25/2010   T10-L2 interbody fusion / [place]     Left  FAMILY HISTORY: Family History  Problem Relation Age of Onset   Brain cancer Mother    Hypertension Mother    Arthritis Mother    Dementia Father    Hypertension Father    Arthritis Father    Breast cancer Sister    Lung cancer Sister    Lung disease Neg Hx    Rheumatologic disease Neg Hx     SOCIAL HISTORY: Social History   Socioeconomic History   Marital status: Widowed    Spouse name: Not on file   Number of children: 1   Years of education: 12+ coll.   Highest education level: Not on file  Occupational History   Occupation: Education officer, environmental: RETIRED    Comment: retired  Tobacco Use   Smoking status: Never   Smokeless tobacco: Never   Tobacco comments:    Father   Vaping Use   Vaping Use: Never used  Substance and Sexual Activity   Alcohol use: Not Currently    Comment: heavy drinker until 1995 - sobriety with AA   Drug use: No   Sexual activity: Not Currently  Other Topics Concern   Not on file  Social History Narrative   07/03/21 friend [name] living with her   Right handed   Drinks 6-8 cups caffeine daily      Social Determinants of Health   Financial Resource Strain: Low Risk  (06/25/2022)   Overall Financial Resource Strain (CARDIA)    Difficulty of Paying Living Expenses: Not hard at all  Food Insecurity: No Food Insecurity (06/25/2022)   Hunger Vital Sign    Worried About Running Out of Food in the Last Year: Never true    [place] in the Last Year: Never true  Transportation Needs: No Transportation Needs (07/17/2022)   PRAPARE - Radiographer, therapeutic (Medical): No    Lack of Transportation (Non-Medical): No  Physical Activity: Inactive (06/25/2022)   Exercise Vital Sign    Days of Exercise per Week: 0 days    Minutes of Exercise per Session: 0 min  Stress: No Stress Concern Present (06/25/2022)   [place]    Feeling of Stress : Not at all  Social Connections: Moderately Integrated (06/25/2022)   Social Connection and Isolation Panel [NHANES]    Frequency of Communication with Friends and Family: More than three times a week    Frequency of Social Gatherings with Friends and Family: More than three times a week    Attends Religious Services: More than 4 times per year    Active Member of [employer] or Organizations: Yes    Attends Archivist Meetings: More than 4 times per year    Marital Status: Widowed  Intimate Partner Violence: Not At Risk (06/25/2022)   Humiliation, Afraid, Rape, and Kick questionnaire    Fear of Current or Ex-Partner: No    Emotionally Abused: No    Physically Abused: No    Sexually Abused: No      Marcial Pacas, M.D. Ph.D.  [facility] [address] [place], [place] [address] Ph: [phone] Fax: [phone]  CC:  Eulas Post, MD [place],   [id number]  Eulas Post, MD    Total time spent reviewing the chart, obtaining history, examined patient, ordering tests, documentation, consultations and family, care coordination was  60 minutes

## 2022-08-08 DIAGNOSIS — K5790 Diverticulosis of intestine, part unspecified, without perforation or abscess without bleeding: Secondary | ICD-10-CM | POA: Diagnosis not present

## 2022-08-08 DIAGNOSIS — M315 Giant cell arteritis with polymyalgia rheumatica: Secondary | ICD-10-CM | POA: Diagnosis not present

## 2022-08-08 DIAGNOSIS — M1712 Unilateral primary osteoarthritis, left knee: Secondary | ICD-10-CM | POA: Diagnosis not present

## 2022-08-08 DIAGNOSIS — F419 Anxiety disorder, unspecified: Secondary | ICD-10-CM | POA: Diagnosis not present

## 2022-08-08 DIAGNOSIS — M069 Rheumatoid arthritis, unspecified: Secondary | ICD-10-CM | POA: Diagnosis not present

## 2022-08-08 DIAGNOSIS — E1122 Type 2 diabetes mellitus with diabetic chronic kidney disease: Secondary | ICD-10-CM | POA: Diagnosis not present

## 2022-08-08 DIAGNOSIS — E1142 Type 2 diabetes mellitus with diabetic polyneuropathy: Secondary | ICD-10-CM | POA: Diagnosis not present

## 2022-08-08 DIAGNOSIS — M4712 Other spondylosis with myelopathy, cervical region: Secondary | ICD-10-CM | POA: Diagnosis not present

## 2022-08-08 DIAGNOSIS — L97822 Non-pressure chronic ulcer of other part of left lower leg with fat layer exposed: Secondary | ICD-10-CM | POA: Diagnosis not present

## 2022-08-08 DIAGNOSIS — Z9181 History of falling: Secondary | ICD-10-CM | POA: Diagnosis not present

## 2022-08-08 DIAGNOSIS — E559 Vitamin D deficiency, unspecified: Secondary | ICD-10-CM | POA: Diagnosis not present

## 2022-08-08 DIAGNOSIS — K219 Gastro-esophageal reflux disease without esophagitis: Secondary | ICD-10-CM | POA: Diagnosis not present

## 2022-08-08 DIAGNOSIS — G3184 Mild cognitive impairment, so stated: Secondary | ICD-10-CM | POA: Diagnosis not present

## 2022-08-08 DIAGNOSIS — Z79891 Long term (current) use of opiate analgesic: Secondary | ICD-10-CM | POA: Diagnosis not present

## 2022-08-08 DIAGNOSIS — N182 Chronic kidney disease, stage 2 (mild): Secondary | ICD-10-CM | POA: Diagnosis not present

## 2022-08-08 DIAGNOSIS — H5461 Unqualified visual loss, right eye, normal vision left eye: Secondary | ICD-10-CM | POA: Diagnosis not present

## 2022-08-08 DIAGNOSIS — L97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: Secondary | ICD-10-CM | POA: Diagnosis not present

## 2022-08-08 DIAGNOSIS — Z7952 Long term (current) use of systemic steroids: Secondary | ICD-10-CM | POA: Diagnosis not present

## 2022-08-08 DIAGNOSIS — G43909 Migraine, unspecified, not intractable, without status migrainosus: Secondary | ICD-10-CM | POA: Diagnosis not present

## 2022-08-08 DIAGNOSIS — I700 Atherosclerosis of aorta: Secondary | ICD-10-CM | POA: Diagnosis not present

## 2022-08-08 DIAGNOSIS — G40909 Epilepsy, unspecified, not intractable, without status epilepticus: Secondary | ICD-10-CM | POA: Diagnosis not present

## 2022-08-08 DIAGNOSIS — J84112 Idiopathic pulmonary fibrosis: Secondary | ICD-10-CM | POA: Diagnosis not present

## 2022-08-08 DIAGNOSIS — E785 Hyperlipidemia, unspecified: Secondary | ICD-10-CM | POA: Diagnosis not present

## 2022-08-08 DIAGNOSIS — I129 Hypertensive chronic kidney disease with stage 1 through stage 4 chronic kidney disease, or unspecified chronic kidney disease: Secondary | ICD-10-CM | POA: Diagnosis not present

## 2022-08-08 DIAGNOSIS — M810 Age-related osteoporosis without current pathological fracture: Secondary | ICD-10-CM | POA: Diagnosis not present

## 2022-08-08 NOTE — Progress Notes (Signed)
ASAIAH, HUNNICUTT ([id number]) [id number] Page 1 of 4 Visit Report for 08/07/2022 Abuse Risk Screen Details Patient Name: Date of Service: [name], [name] 08/07/2022 9:00 A M Medical Record Number: [id number] Patient Account Number: [id number] Date of Birth/Sex: Treating RN: 08-03-1941 (81 y.o. Elam Dutch Primary Care Raymond Azure: Carolann Littler Other Clinician: Referring Nailyn Dearinger: Treating Karim Aiello/Extender: [name] in Treatment: 0 Abuse Risk Screen Items Answer ABUSE RISK SCREEN: Has anyone close to you tried to hurt or harm you recentlyo No Do you feel uncomfortable with anyone in your familyo No Has anyone forced you do things that you didnt want to doo No Electronic Signature(s) Signed: 08/07/2022 5:53:41 PM By: Baruch Gouty RN, BSN Entered By: Baruch Gouty on 08/07/2022 10:04:19 -------------------------------------------------------------------------------- Activities of Daily Living Details Patient Name: Date of Service: [name], [name] 08/07/2022 9:00 A M Medical Record Number: [id number] Patient Account Number: [id number] Date of Birth/Sex: Treating RN: [birth year] (81 y.o. Elam Dutch Primary Care Belvin Gauss: Carolann Littler Other Clinician: Referring Brena Windsor: Treating Jayleene Glaeser/Extender: [name] in Treatment: 0 Activities of Daily Living Items Answer Activities of Daily Living (Please select one for each item) Drive Automobile Completely Able T Medications ake Completely Able Use T elephone Completely Able Care for Appearance Completely Able Use T oilet Completely Able Bath / Shower Completely Able Dress Self Completely Able Feed Self Completely Able Walk Completely Able Get In / Out Bed Completely Able Housework Completely Able Prepare Meals Completely Able Handle Money Completely Able Shop for Self Completely Able Electronic  Signature(s) Signed: 08/07/2022 5:53:41 PM By: Baruch Gouty RN, BSN Entered By: Baruch Gouty on 08/07/2022 10:04:43 [name] ([id number]) [phone] [id number] Page 2 of 4 -------------------------------------------------------------------------------- Education Screening Details Patient Name: Date of Service: [name], [name] 08/07/2022 9:00 A M Medical Record Number: [id number] Patient Account Number: [id number] Date of Birth/Sex: Treating RN: 11-25-1940 (81 y.o. Elam Dutch Primary Care Tameshia Bonneville: Carolann Littler Other Clinician: Referring Skila Rollins: Treating Roshawn Lacina/Extender: [name] in Treatment: 0 Primary Learner Assessed: Patient Learning Preferences/Education Level/Primary Language Learning Preference: Explanation, Demonstration, Printed Material Highest Education Level: College or Above Preferred Language: English Cognitive Barrier Language Barrier: No Translator Needed: No Memory Deficit: No Emotional Barrier: No Cultural/Religious Beliefs Affecting Medical Care: No Physical Barrier Impaired Vision: No Impaired Hearing: No Decreased Hand dexterity: No Knowledge/Comprehension Knowledge Level: High Comprehension Level: High Ability to understand written instructions: High Ability to understand verbal instructions: High Motivation Anxiety Level: Calm Cooperation: Cooperative Education Importance: Acknowledges Need Interest in Health Problems: Asks Questions Perception: Coherent Willingness to Engage in Self-Management High Activities: Readiness to Engage in Self-Management High Activities: Electronic Signature(s) Signed: 08/07/2022 5:53:41 PM By: Baruch Gouty RN, BSN Entered By: Baruch Gouty on 08/07/2022 10:05:09 -------------------------------------------------------------------------------- Fall Risk Assessment Details Patient Name: Date of Service: [name], [name] 08/07/2022 9:00 A M Medical Record Number: [id number] Patient Account Number: [id number] Date of Birth/Sex: Treating RN: 04-Mar-1941 (81 y.o. Elam Dutch Primary Care Keisean Skowron: Carolann Littler Other Clinician: Referring Claudie Rathbone: Treating Jadesola Poynter/Extender: [name] in Treatment: 0 Fall Risk Assessment Items Have you had 2 or more falls in the last [address] CORLETTE, CIANO ([id number]) [phone] [id number] Page 3 of 4 Have you had any fall that resulted in injury in the last 12 monthso 0 Yes FALLS RISK SCREEN History of falling - immediate or within 3 months  25 Yes Secondary diagnosis (Do you have 2 or more medical diagnoseso) 0 No Ambulatory aid None/bed rest/wheelchair/nurse 0 Yes Crutches/cane/walker 0 No Furniture 0 No Intravenous therapy Access/Saline/Heparin Lock 0 No Gait/Transferring Normal/ bed rest/ wheelchair 0 Yes Weak (short steps with or without shuffle, stooped but able to lift head while walking, may seek 0 No support from furniture) Impaired (short steps with shuffle, may have difficulty arising from chair, head down, impaired 0 No balance) Mental Status Oriented to own ability 0 Yes Electronic Signature(s) Signed: 08/07/2022 5:53:41 PM By: Baruch Gouty RN, BSN Entered By: Baruch Gouty on 08/07/2022 10:05:24 -------------------------------------------------------------------------------- Foot Assessment Details Patient Name: Date of Service: [name], [place] RO [name] 08/07/2022 9:00 A M Medical Record Number: [id number] Patient Account Number: [id number] Date of Birth/Sex: Treating RN: 05/28/1941 (81 y.o. Elam Dutch Primary Care Sullivan Blasing: Carolann Littler Other Clinician: Referring Wataru Mccowen: Treating Hays Dunnigan/Extender: [name] in Treatment: 0 Foot Assessment Items Site Locations + = Sensation present, - = Sensation absent, C = Callus, U =  Ulcer R = Redness, W = Warmth, M = Maceration, PU = Pre-ulcerative lesion F = Fissure, S = Swelling, D = Dryness Assessment Right: Left: Other Deformity: No No Prior Foot Ulcer: No No Prior Amputation: No No Charcot Joint: No No Ambulatory Status: Ambulatory With Help Assistance Device: NGUYEN, BUTLER ([id number]) [phone] [id number] Page 4 of 4 Gait: Steady Electronic Signature(s) Signed: 08/07/2022 5:53:41 PM By: Baruch Gouty RN, BSN Entered By: Baruch Gouty on 08/07/2022 10:08:13 -------------------------------------------------------------------------------- Nutrition Risk Screening Details Patient Name: Date of Service: [name], [place] RO [name] 08/07/2022 9:00 A M Medical Record Number: [id number] Patient Account Number: [id number] Date of Birth/Sex: Treating RN: Oct 20, 1940 (81 y.o. Elam Dutch Primary Care Ellissa Ayo: Carolann Littler Other Clinician: Referring Aubery Douthat: Treating Marda Breidenbach/Extender: [name] in Treatment: 0 Height (in): 61 Weight (lbs): 128 Body Mass Index (BMI): 24.2 Nutrition Risk Screening Items Score Screening NUTRITION RISK SCREEN: I have an illness or condition that made me change the kind and/or amount of food I eat 0 No I eat fewer than two meals per day 3 Yes I eat few fruits and vegetables, or milk products 2 Yes I have three or more drinks of beer, liquor or wine almost every day 0 No I have tooth or mouth problems that make it hard for me to eat 0 No I don't always have enough money to buy the food I need 0 No I eat alone most of the time 1 Yes I take three or more different prescribed or over-the-counter drugs a day 1 Yes Without wanting to, I have lost or gained 10 pounds in the last six months 0 No I am not always physically able to shop, cook and/or feed myself 0 No Nutrition Protocols Good Risk Protocol Moderate Risk Protocol High Risk Proctocol 0 Provide  education on nutrition Risk Level: High Risk Score: 7 Electronic Signature(s) Signed: 08/07/2022 5:53:41 PM By: Baruch Gouty RN, BSN Entered By: Baruch Gouty on 08/07/2022 10:06:07

## 2022-08-08 NOTE — Progress Notes (Signed)
KEYAIRA, CLAPHAM ([id number]) [id number] Page 1 of 11 Visit Report for 08/07/2022 Allergy List Details Patient Name: Date of Service: [name], [place] [name] [name] 08/07/2022 9:00 A M Medical Record Number: [id number] Patient Account Number: [id number] Date of Birth/Sex: Treating RN: Jan 24, 1941 (81 y.o. Elam Dutch Primary Care Tiarrah Saville: Carolann Littler Other Clinician: Referring Tyneshia Stivers: Treating Tiahna Cure/Extender: [name] in Treatment: 0 Allergies Active Allergies hydromorphone Reaction: could not speak Allergy Notes Electronic Signature(s) Signed: 08/07/2022 5:53:41 PM By: Baruch Gouty RN, BSN Entered By: Baruch Gouty on 08/07/2022 09:50:37 -------------------------------------------------------------------------------- Arrival Information Details Patient Name: Date of Service: [name], [place] [name] [name] 08/07/2022 9:00 A M Medical Record Number: [id number] Patient Account Number: [id number] Date of Birth/Sex: Treating RN: [birth year] (81 y.o. Elam Dutch Primary Care Fedor Kazmierski: Carolann Littler Other Clinician: Referring Gorge Almanza: Treating Chaitanya Amedee/Extender: [name] in Treatment: 0 Visit Information Patient Arrived: Charlyn Minerva Time: 09:41 Accompanied By: friend Transfer Assistance: None Patient Identification Verified: Yes Secondary Verification Process Completed: Yes Patient Requires Transmission-Based Precautions: No Patient Has Alerts: No History Since Last Visit Has Dressing in Place as Prescribed: Yes Electronic Signature(s) Signed: 08/07/2022 5:53:41 PM By: Baruch Gouty RN, BSN Entered By: Baruch Gouty on 08/07/2022 09:42:32 -------------------------------------------------------------------------------- Clinic Level of Care Assessment Details Patient Name: Date of Service: [name], [place] [name] [name] 08/07/2022 9:00 A Glennie Hawk ([id number])  [phone].pdf Page 2 of 11 Medical Record Number: [id number] Patient Account Number: [id number] Date of Birth/Sex: Treating RN: [birth year] (81 y.o. Elam Dutch Primary Care Shaundra Fullam: Carolann Littler Other Clinician: Referring Jacora Hopkins: Treating Galan Ghee/Extender: [name] in Treatment: 0 Clinic Level of Care Assessment Items TOOL 1 Quantity Score '[]'$  - 0 Use when EandM and Procedure is performed on INITIAL visit ASSESSMENTS - Nursing Assessment / Reassessment X- 1 20 General Physical Exam (combine w/ comprehensive assessment (listed just below) when performed on new pt. evals) X- 1 25 Comprehensive Assessment (HX, ROS, Risk Assessments, Wounds Hx, etc.) ASSESSMENTS - Wound and Skin Assessment / Reassessment '[]'$  - 0 Dermatologic / Skin Assessment (not related to wound area) ASSESSMENTS - Ostomy and/or Continence Assessment and Care '[]'$  - 0 Incontinence Assessment and Management '[]'$  - 0 Ostomy Care Assessment and Management (repouching, etc.) PROCESS - Coordination of Care X - Simple Patient / Family Education for ongoing care 1 15 '[]'$  - 0 Complex (extensive) Patient / Family Education for ongoing care X- 1 10 Staff obtains Programmer, systems, Records, T Results / Process Orders est '[]'$  - 0 Staff telephones HHA, Nursing Homes / Clarify orders / etc '[]'$  - 0 Routine Transfer to another Facility (non-emergent condition) '[]'$  - 0 Routine Hospital Admission (non-emergent condition) X- 1 15 New Admissions / Biomedical engineer / Ordering NPWT Apligraf, etc. , '[]'$  - 0 Emergency Hospital Admission (emergent condition) PROCESS - Special Needs '[]'$  - 0 Pediatric / Minor Patient Management '[]'$  - 0 Isolation Patient Management '[]'$  - 0 Hearing / Language / Visual special needs '[]'$  - 0 Assessment of Community assistance (transportation, D/C planning, etc.) '[]'$  - 0 Additional assistance / Altered mentation '[]'$  - 0 Support Surface(s)  Assessment (bed, cushion, seat, etc.) INTERVENTIONS - Miscellaneous '[]'$  - 0 External ear exam '[]'$  - 0 Patient Transfer (multiple staff / Civil Service fast streamer / Similar devices) '[]'$  - 0 Simple Staple / Suture removal (25 or less) '[]'$  - 0 Complex Staple / Suture removal (26 or more) '[]'$  - 0 Hypo/Hyperglycemic Management (do not check if billed  separately) X- 1 15 Ankle / Brachial Index (ABI) - do not check if billed separately Has the patient been seen at the hospital within the last three years: Yes Total Score: 100 Level Of Care: New/Established - Level 3 Electronic Signature(s) Signed: 08/07/2022 5:53:41 PM By: Baruch Gouty RN, BSN Entered By: Baruch Gouty on 08/07/2022 10:36:32 [name] ([id number]) [id number] Page 3 of 11 -------------------------------------------------------------------------------- Compression Therapy Details Patient Name: Date of Service: [name], [place] [name] [name] 08/07/2022 9:00 A M Medical Record Number: [id number] Patient Account Number: [id number] Date of Birth/Sex: Treating RN: [birth year] (81 y.o. Elam Dutch Primary Care Selso Mannor: Carolann Littler Other Clinician: Referring Jaycelyn Orrison: Treating Latasia Silberstein/Extender: [name] in Treatment: 0 Compression Therapy Performed for Wound Assessment: Wound #6 Left,Anterior Lower Leg Performed By: Clinician Baruch Gouty, RN Compression Type: Three Layer Post Procedure Diagnosis Same as Pre-procedure Electronic Signature(s) Signed: 08/07/2022 5:53:41 PM By: Baruch Gouty RN, BSN Entered By: Baruch Gouty on 08/07/2022 10:33:28 -------------------------------------------------------------------------------- Encounter Discharge Information Details Patient Name: Date of Service: [name] [name] 08/07/2022 9:00 A M Medical Record Number: [id number] Patient Account Number: [id number] Date of Birth/Sex: Treating RN: 05/08/1941 (81 y.o. Elam Dutch Primary Care Rhaelyn Giron: Carolann Littler Other Clinician: Referring Nadiya Pieratt: Treating Miral Hoopes/Extender: [name] in Treatment: 0 Encounter Discharge Information Items Post Procedure Vitals Discharge Condition: Stable Temperature (F): 98 Ambulatory Status: Walker Pulse (bpm): 67 Discharge Destination: Home Respiratory Rate (breaths/min): 18 Transportation: Private Auto Blood Pressure (mmHg): 144/82 Accompanied By: friend Schedule Follow-up Appointment: Yes Clinical Summary of Care: Patient Declined Electronic Signature(s) Signed: 08/07/2022 5:53:41 PM By: Baruch Gouty RN, BSN Entered By: Baruch Gouty on 08/07/2022 11:54:11 -------------------------------------------------------------------------------- Lower Extremity Assessment Details Patient Name: Date of Service: [name] [name] [name] 08/07/2022 9:00 A M Medical Record Number: [id number] Patient Account Number: [id number] Date of Birth/Sex: Treating RN: 1941-08-03 (81 y.o. Elam Dutch Primary Care Tylena Prisk: Carolann Littler Other Clinician: Referring Caedmon Louque: Treating Briar Witherspoon/Extender: [name] in Treatment: 0 Edema Assessment Left: [Left: Right] [name]: :] Assessed: [Left: No] [Right: No] Edema: [Left: Ye] [Right: s] Calf Left: Right: Point of Measurement: From Medial Instep 41 cm Ankle Left: Right: Point of Measurement: From Medial Instep 23.5 cm Knee To Floor Left: Right: From Medial Instep 37 cm Vascular Assessment Pulses: Dorsalis Pedis Palpable: [Left:Yes] Blood Pressure: Brachial: [Left:144] Dorsalis Pedis: 176 Ankle: Posterior Tibial: 180 Ankle Brachial Index: [Left:1.25] Electronic Signature(s) Signed: 08/07/2022 5:53:41 PM By: Baruch Gouty RN, BSN Entered By: Baruch Gouty on 08/07/2022 10:22:36 -------------------------------------------------------------------------------- Multi Wound Chart  Details Patient Name: Date of Service: [name] [name] [name] 08/07/2022 9:00 A M Medical Record Number: [id number] Patient Account Number: [id number] Date of Birth/Sex: Treating RN: 1940-12-07 (81 y.o. F) Primary Care Catilyn Boggus: Carolann Littler Other Clinician: Referring Zaul Hubers: Treating Michi Herrmann/Extender: [name] in Treatment: 0 Vital Signs Height(in): 61 Pulse(bpm): 67 Weight(lbs): 128 Blood Pressure(mmHg): 144/82 Body Mass Index(BMI): 24.2 Temperature(F): 98 Respiratory Rate(breaths/min): 18 [6:Photos:] [N/A:N/A] Left, Anterior Lower Leg Left, Plantar Foot N/A Wound Location: Trauma Gradually Appeared N/A Wounding Event: Venous Leg Ulcer Neuropathic Ulcer-Non Diabetic N/A Primary Etiology: Cataracts, Hypertension, Rheumatoid Cataracts, Hypertension, Rheumatoid N/A Comorbid History: Arthritis, Seizure Disorder Arthritis, Seizure Disorder 07/08/2022 06/24/2022 N/A Date Acquired: 0 0 N/A Weeks of Treatment: [name], [name] ([id number]) [id number] Page 5 of 11 Open Open N/A Wound Status: No No N/A Wound Recurrence: 6.5x6.4x0.1 0.3x0.4x0.3 N/A Measurements L x W x  D (cm) 32.673 0.094 N/A A (cm) : rea 3.267 0.028 N/A Volume (cm) : 12 Starting Position 1 (o'clock): 12 Ending Position 1 (o'clock): 0.4 Maximum Distance 1 (cm): No Yes N/A Undermining: Full Thickness Without Exposed Full Thickness Without Exposed N/A Classification: Support Structures Support Structures Medium Small N/A Exudate A mount: Serosanguineous Serosanguineous N/A Exudate Type: red, brown red, brown N/A Exudate Color: Flat and Intact Thickened N/A Wound Margin: Medium (34-66%) Large (67-100%) N/A Granulation A mount: Red Red N/A Granulation Quality: Medium (34-66%) Small (1-33%) N/A Necrotic A mount: Fat Layer (Subcutaneous Tissue): Yes Fat Layer (Subcutaneous Tissue): Yes N/A Exposed Structures: Fascia: No Fascia:  No Tendon: No Tendon: No Muscle: No Muscle: No Joint: No Joint: No Bone: No Bone: No Small (1-33%) None N/A Epithelialization: Debridement - Selective/Open Wound Debridement - Selective/Open Wound N/A Debridement: Pre-procedure Verification/Time Out 10:25 10:25 N/A Taken: Lidocaine 4% Topical Solution Lidocaine 4% T opical Solution N/A Pain Control: [facility], Slough N/A Tissue Debrided: Non-Viable Tissue Skin/Epidermis N/A Level: 41.6 2.25 N/A Debridement A (sq cm): rea Curette Curette N/A Instrument: Minimum Minimum N/A Bleeding: Pressure Pressure N/A Hemostasis A chieved: 0 0 N/A Procedural Pain: 0 0 N/A Post Procedural Pain: Procedure was tolerated well Procedure was tolerated well N/A Debridement Treatment Response: 6.5x6.4x0.1 0.4x0.4x0.2 N/A Post Debridement Measurements L x W x D (cm) 3.267 0.025 N/A Post Debridement Volume: (cm) Excoriation: No Callus: Yes N/A Periwound Skin Texture: Induration: No Callus: No Crepitus: No Rash: No Scarring: No Maceration: No No Abnormalities Noted N/A Periwound Skin Moisture: Dry/Scaly: No Hemosiderin Staining: Yes No Abnormalities Noted N/A Periwound Skin Color: Atrophie Blanche: No Cyanosis: No Ecchymosis: No Erythema: No Mottled: No Pallor: No Rubor: No No Abnormality No Abnormality N/A Temperature: Yes N/A N/A Tenderness on Palpation: Compression Therapy Debridement N/A Procedures Performed: Debridement Treatment Notes Electronic Signature(s) Signed: 08/07/2022 10:42:37 AM By: Fredirick Maudlin MD FACS Entered By: Fredirick Maudlin on 08/07/2022 10:42:37 -------------------------------------------------------------------------------- Multi-Disciplinary Care Plan Details Patient Name: Date of Service: [name], [place] [name] [name] 08/07/2022 9:00 A M Medical Record Number: [id number] Patient Account Number: [id number] Date of Birth/Sex: Treating RN: 1941/04/19 (81 y.o. Elam Dutch Primary  Care Baljit Liebert: Carolann Littler Other Clinician: Earnest [name] ([id number]) [id number] Page 6 of 11 Referring Gerri Acre: Treating Denai Caba/Extender: [name] in Treatment: 0 Multidisciplinary Care Plan reviewed with physician Active Inactive Abuse / Safety / Falls / Self Care Management Nursing Diagnoses: History of Falls Potential for falls Goals: Patient/caregiver will verbalize/demonstrate measures taken to prevent injury and/or falls Date Initiated: 08/07/2022 Target Resolution Date: 09/04/2022 Goal Status: Active Interventions: Assess fall risk on admission and as needed Assess impairment of mobility on admission and as needed per policy Notes: Venous Leg Ulcer Nursing Diagnoses: Knowledge deficit related to disease process and management Potential for venous Insuffiency (use before diagnosis confirmed) Goals: Patient will maintain optimal edema control Date Initiated: 08/07/2022 Target Resolution Date: 09/05/2022 Goal Status: Active Interventions: Assess peripheral edema status every visit. Compression as ordered Provide education on venous insufficiency Treatment Activities: Therapeutic compression applied : 08/07/2022 Notes: Wound/Skin Impairment Nursing Diagnoses: Impaired tissue integrity Knowledge deficit related to ulceration/compromised skin integrity Goals: Patient/caregiver will verbalize understanding of skin care regimen Date Initiated: 08/07/2022 Target Resolution Date: 09/04/2022 Goal Status: Active Ulcer/skin breakdown will have a volume reduction of 30% by week 4 Date Initiated: 08/07/2022 Target Resolution Date: 09/04/2022 Goal Status: Active Interventions: Assess patient/caregiver ability to obtain necessary supplies Assess patient/caregiver ability to perform ulcer/skin care regimen upon admission  and as needed Assess ulceration(s) every visit Provide education on ulcer and skin  care Treatment Activities: Skin care regimen initiated : 08/07/2022 Topical wound management initiated : 08/07/2022 Notes: Electronic Signature(s) Signed: 08/07/2022 5:53:41 PM By: Baruch Gouty RN, BSN Entered By: Baruch Gouty on 08/07/2022 10:34:58 [name] ([id number]) [id number] Page 7 of 11 -------------------------------------------------------------------------------- Pain Assessment Details Patient Name: Date of Service: [name], [place] [name] [name] 08/07/2022 9:00 A M Medical Record Number: [id number] Patient Account Number: [id number] Date of Birth/Sex: Treating RN: 1941/05/01 (81 y.o. Elam Dutch Primary Care Jaquae Rieves: Carolann Littler Other Clinician: Referring Monique Hefty: Treating Alzora Ha/Extender: [name] in Treatment: 0 Active Problems Location of Pain Severity and Description of Pain Patient Has Paino Yes Site Locations Pain Location: Pain in Ulcers With Dressing Change: Yes Duration of the Pain. Constant / Intermittento Intermittent Rate the pain. Current Pain Level: 0 Worst Pain Level: 3 Character of Pain Describe the Pain: Burning, Other: stinging Pain Management and Medication Current Pain Management: Medication: Yes Is the Current Pain Management Adequate: Adequate How does your wound impact your activities of daily livingo Sleep: No Bathing: No Appetite: No Relationship With Others: No Bladder Continence: No Emotions: No Bowel Continence: No Work: No Toileting: No Drive: No Dressing: No Hobbies: No Electronic Signature(s) Signed: 08/07/2022 5:53:41 PM By: Baruch Gouty RN, BSN Entered By: Baruch Gouty on 08/07/2022 10:25:16 -------------------------------------------------------------------------------- Patient/Caregiver Education Details Patient Name: Date of Service: [name] [name] [name] 11/14/2023andnbsp9:00 A M Medical Record Number: [id number] Patient  Account Number: [id number] Date of Birth/Gender: Treating RN: 06-26-1941 (81 y.o. Elam Dutch Primary Care Physician: Carolann Littler Other Clinician: Referring Physician: Treating Physician/Extender: Kamira, Mellette ([id number]) [id number] Page 8 of 11 Weeks in Treatment: 0 Education Assessment Education Provided To: Patient Education Topics Provided Venous: Handouts: Controlling Swelling with Multilayered Compression Wraps Methods: Explain/Verbal, Printed Responses: Reinforcements needed, State content correctly [name]: o Handouts: Welcome T [facility] o Methods: Explain/Verbal, Printed Responses: Reinforcements needed, State content correctly Wound Debridement: Handouts: Wound Debridement Methods: Explain/Verbal, Printed Responses: Reinforcements needed, State content correctly Electronic Signature(s) Signed: 08/07/2022 5:53:41 PM By: Baruch Gouty RN, BSN Entered By: Baruch Gouty on 08/07/2022 10:35:50 -------------------------------------------------------------------------------- Wound Assessment Details Patient Name: Date of Service: [name], [place] [name] [name] 08/07/2022 9:00 A M Medical Record Number: [id number] Patient Account Number: [id number] Date of Birth/Sex: Treating RN: Feb 19, 1941 (81 y.o. Elam Dutch Primary Care Kerrigan Gombos: Carolann Littler Other Clinician: Referring Rilley Stash: Treating Mychaela Lennartz/Extender: [name] in Treatment: 0 Wound Status Wound Number: 6 Primary Venous Leg Ulcer Etiology: Wound Location: Left, Anterior Lower Leg Wound Status: Open Wounding Event: Trauma Comorbid Cataracts, Hypertension, Rheumatoid Arthritis, Seizure Date Acquired: 07/08/2022 History: Disorder Weeks Of Treatment: 0 Clustered Wound: No Photos Wound Measurements Length: (cm) 6.5 Width: (cm) 6.4 Depth: (cm) 0.1 Area: (cm)  32.673 [name], [name] ([id number]) Volume: (cm) 3.267 % Reduction in Area: % Reduction in Volume: Epithelialization: Small (1-33%) Tunneling: No [id number] Page 9 of 11 Undermining: No Wound Description Classification: Full Thickness Without Exposed Support Wound Margin: Flat and Intact Exudate Amount: Medium Exudate Type: Serosanguineous Exudate Color: red, brown Structures Foul Odor After Cleansing: No Slough/Fibrino Yes Wound Bed Granulation Amount: Medium (34-66%) Exposed Structure Granulation Quality: Red Fascia Exposed: No Necrotic Amount: Medium (34-66%) Fat Layer (Subcutaneous Tissue) Exposed: Yes Necrotic Quality: Adherent Slough Tendon Exposed: No Muscle Exposed: No Joint Exposed: No Bone Exposed:  No Periwound Skin Texture Texture Color No Abnormalities Noted: Yes No Abnormalities Noted: No Atrophie Blanche: No Moisture Cyanosis: No No Abnormalities Noted: Yes Ecchymosis: No Erythema: No Hemosiderin Staining: Yes Mottled: No Pallor: No Rubor: No Temperature / Pain Temperature: No Abnormality Tenderness on Palpation: Yes Treatment Notes Wound #6 (Lower Leg) Wound Laterality: Left, Anterior Cleanser Peri-Wound Care Sween Lotion (Moisturizing lotion) Discharge Instruction: Apply moisturizing lotion as directed Topical Primary Dressing KerraCel Ag Gelling Fiber Dressing, 2x2 in (silver alginate) Discharge Instruction: Apply silver alginate to wound bed as instructed Secondary Dressing ABD Pad, 8x10 Discharge Instruction: Apply over primary dressing as directed. Secured With Compression Wrap ThreePress (3 layer compression wrap) Discharge Instruction: Apply three layer compression as directed. Compression Stockings Add-Ons Electronic Signature(s) Signed: 08/07/2022 5:53:41 PM By: Baruch Gouty RN, BSN Entered By: Baruch Gouty on 08/07/2022 10:15:19 [name] ([id number])  [id number] Page 10 of 11 -------------------------------------------------------------------------------- Wound Assessment Details Patient Name: Date of Service: [name], [place] [name] [name] 08/07/2022 9:00 A M Medical Record Number: [id number] Patient Account Number: [id number] Date of Birth/Sex: Treating RN: Apr 04, 1941 (81 y.o. Elam Dutch Primary Care Jahaad Penado: Carolann Littler Other Clinician: Referring Andrian Urbach: Treating Valissa Lyvers/Extender: [name] in Treatment: 0 Wound Status Wound Number: 7 Primary Neuropathic Ulcer-Non Diabetic Etiology: Wound Location: Left, Plantar Foot Wound Status: Open Wounding Event: Gradually Appeared Comorbid Cataracts, Hypertension, Rheumatoid Arthritis, Seizure Date Acquired: 06/24/2022 History: Disorder Weeks Of Treatment: 0 Clustered Wound: No Photos Wound Measurements Length: (cm) 0.3 Width: (cm) 0.4 Depth: (cm) 0.3 Area: (cm) 0.094 Volume: (cm) 0.028 % Reduction in Area: % Reduction in Volume: Epithelialization: None Tunneling: No Undermining: Yes Starting Position (o'clock): 12 Ending Position (o'clock): 12 Maximum Distance: (cm) 0.4 Wound Description Classification: Full Thickness Without Exposed Support Wound Margin: Thickened Exudate Amount: Small Exudate Type: Serosanguineous Exudate Color: red, brown Structures Foul Odor After Cleansing: No Slough/Fibrino Yes Wound Bed Granulation Amount: Large (67-100%) Exposed Structure Granulation Quality: Red Fascia Exposed: No Necrotic Amount: Small (1-33%) Fat Layer (Subcutaneous Tissue) Exposed: Yes Necrotic Quality: Adherent Slough Tendon Exposed: No Muscle Exposed: No Joint Exposed: No Bone Exposed: No Periwound Skin Texture Texture Color No Abnormalities Noted: No No Abnormalities Noted: Yes Callus: Yes Temperature / Pain Temperature: No Abnormality Moisture No Abnormalities Noted: Yes Treatment  Notes Wound #7 (Foot) Wound Laterality: Plantar, Left Cleanser Peri-Wound Care [name], [name] ([id number]) [phone].pdf Page 11 of 11 Topical Primary Dressing KerraCel Ag Gelling Fiber Dressing, 2x2 in (silver alginate) Discharge Instruction: Apply silver alginate to wound bed as instructed Secondary Dressing Optifoam Non-Adhesive Dressing, 4x4 in Discharge Instruction: Apply over primary dressing cut to make foam donut Woven Gauze Sponges 2x2 in Discharge Instruction: Apply over primary dressing as directed. Secured With 49M Medipore H Soft Cloth Surgical T ape, 4 x 10 (in/yd) Discharge Instruction: Secure with tape as directed. Compression Wrap Compression Stockings Add-Ons Electronic Signature(s) Signed: 08/07/2022 5:53:41 PM By: Baruch Gouty RN, BSN Entered By: Baruch Gouty on 08/07/2022 10:15:55 -------------------------------------------------------------------------------- Vitals Details Patient Name: Date of Service: [name], [place] [name] [name] 08/07/2022 9:00 A M Medical Record Number: [id number] Patient Account Number: [id number] Date of Birth/Sex: Treating RN: 1941-02-06 (81 y.o. Elam Dutch Primary Care Fernando Torry: Carolann Littler Other Clinician: Referring Aide Wojnar: Treating Maddix Kliewer/Extender: [name] in Treatment: 0 Vital Signs Time Taken: 09:42 Temperature (F): 98 Height (in): 61 Pulse (bpm): 67 Source: Stated Respiratory Rate (breaths/min): 18 Weight (lbs): 128 Blood Pressure (mmHg): 144/82 Source: Stated Reference Range: 80 -  120 mg / dl Body Mass Index (BMI): 24.2 Electronic Signature(s) Signed: 08/07/2022 5:53:41 PM By: Baruch Gouty RN, BSN Entered By: Baruch Gouty on 08/07/2022 09:43:08

## 2022-08-09 ENCOUNTER — Telehealth: Payer: Self-pay | Admitting: Family Medicine

## 2022-08-09 NOTE — Telephone Encounter (Signed)
Pt called to say she is almost out of the oxyCODONE (OXYCONTIN) 10 mg 12 hr tablet  Pt states she has about a week left.   Pt states [facility] will be contacting MD for authorization and she wants to make sure MD does that ASAP, as she is worried she will run out before the next refill. Pt was reminded MD does not work on Thursdays.  [place], [place] Phone: [phone]  Fax: [phone]      LOV:  08/01/22 = HFU

## 2022-08-09 NOTE — Telephone Encounter (Signed)
Spoke with pharmacist at [facility], Oxycodone '10mg'$  will be prepared to be shipped out on today.   See 08/07/22 telephone message.

## 2022-08-09 NOTE — Telephone Encounter (Signed)
Called [facility] spoke with Rodman Key, pharmacist.   Message given below per Dr. Elease Hashimoto.    Rodman Key stated that prescriptions will be prepared to be shipped out to patient.

## 2022-08-12 DIAGNOSIS — M315 Giant cell arteritis with polymyalgia rheumatica: Secondary | ICD-10-CM | POA: Diagnosis not present

## 2022-08-12 DIAGNOSIS — L97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: Secondary | ICD-10-CM | POA: Diagnosis not present

## 2022-08-12 DIAGNOSIS — M069 Rheumatoid arthritis, unspecified: Secondary | ICD-10-CM | POA: Diagnosis not present

## 2022-08-12 DIAGNOSIS — L97822 Non-pressure chronic ulcer of other part of left lower leg with fat layer exposed: Secondary | ICD-10-CM | POA: Diagnosis not present

## 2022-08-12 DIAGNOSIS — J84112 Idiopathic pulmonary fibrosis: Secondary | ICD-10-CM | POA: Diagnosis not present

## 2022-08-12 DIAGNOSIS — E1142 Type 2 diabetes mellitus with diabetic polyneuropathy: Secondary | ICD-10-CM | POA: Diagnosis not present

## 2022-08-13 DIAGNOSIS — L97822 Non-pressure chronic ulcer of other part of left lower leg with fat layer exposed: Secondary | ICD-10-CM | POA: Diagnosis not present

## 2022-08-13 DIAGNOSIS — M315 Giant cell arteritis with polymyalgia rheumatica: Secondary | ICD-10-CM | POA: Diagnosis not present

## 2022-08-13 DIAGNOSIS — E1142 Type 2 diabetes mellitus with diabetic polyneuropathy: Secondary | ICD-10-CM | POA: Diagnosis not present

## 2022-08-13 DIAGNOSIS — J84112 Idiopathic pulmonary fibrosis: Secondary | ICD-10-CM | POA: Diagnosis not present

## 2022-08-13 DIAGNOSIS — M069 Rheumatoid arthritis, unspecified: Secondary | ICD-10-CM | POA: Diagnosis not present

## 2022-08-13 DIAGNOSIS — L97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: Secondary | ICD-10-CM | POA: Diagnosis not present

## 2022-08-14 ENCOUNTER — Ambulatory Visit (INDEPENDENT_AMBULATORY_CARE_PROVIDER_SITE_OTHER): Payer: Medicare Other | Admitting: Family Medicine

## 2022-08-14 ENCOUNTER — Encounter (HOSPITAL_BASED_OUTPATIENT_CLINIC_OR_DEPARTMENT_OTHER): Payer: Medicare Other | Admitting: General Surgery

## 2022-08-14 ENCOUNTER — Encounter: Payer: Self-pay | Admitting: Family Medicine

## 2022-08-14 VITALS — BP 160/80 | HR 53 | Temp 98.2°F | Ht 61.0 in | Wt 132.2 lb

## 2022-08-14 DIAGNOSIS — R5383 Other fatigue: Secondary | ICD-10-CM

## 2022-08-14 DIAGNOSIS — L97822 Non-pressure chronic ulcer of other part of left lower leg with fat layer exposed: Secondary | ICD-10-CM | POA: Diagnosis not present

## 2022-08-14 DIAGNOSIS — I129 Hypertensive chronic kidney disease with stage 1 through stage 4 chronic kidney disease, or unspecified chronic kidney disease: Secondary | ICD-10-CM | POA: Diagnosis not present

## 2022-08-14 DIAGNOSIS — G6289 Other specified polyneuropathies: Secondary | ICD-10-CM | POA: Diagnosis not present

## 2022-08-14 DIAGNOSIS — G63 Polyneuropathy in diseases classified elsewhere: Secondary | ICD-10-CM | POA: Diagnosis not present

## 2022-08-14 DIAGNOSIS — L97522 Non-pressure chronic ulcer of other part of left foot with fat layer exposed: Secondary | ICD-10-CM | POA: Diagnosis not present

## 2022-08-14 DIAGNOSIS — I872 Venous insufficiency (chronic) (peripheral): Secondary | ICD-10-CM | POA: Diagnosis not present

## 2022-08-14 DIAGNOSIS — R059 Cough, unspecified: Secondary | ICD-10-CM

## 2022-08-14 DIAGNOSIS — M069 Rheumatoid arthritis, unspecified: Secondary | ICD-10-CM | POA: Diagnosis not present

## 2022-08-14 DIAGNOSIS — L97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: Secondary | ICD-10-CM | POA: Diagnosis not present

## 2022-08-14 DIAGNOSIS — N182 Chronic kidney disease, stage 2 (mild): Secondary | ICD-10-CM | POA: Diagnosis not present

## 2022-08-14 MED ORDER — DOXYCYCLINE HYCLATE 100 MG PO CAPS: 100.0000 mg | ORAL_CAPSULE | Freq: Two times a day (BID) | ORAL | 0 refills | Status: DC

## 2022-08-14 NOTE — Progress Notes (Unsigned)
Established Patient Office Visit  Subjective   Patient ID: [name], female    DOB: 03/30/1941  Age: 81 y.o. MRN: [id number]  Chief Complaint  Patient presents with   Fatigue    Patient complains of fatigue,    Cough    Patient complains of cough, Productive cough, x1 month, Tried Mucinex with little relief   Nasal Congestion    Patient complains of nasal congestion, x1 month     HPI  {History (Optional):23778} [name] is here today accompanied by a friend who drove her and has been staying with her recently.  She states that she has felt poorly for the past month.  She states 4 weeks ago she developed a "cold ".  No fever.  She had some cough which she states is productive for about a month.  Tried Mucinex with little relief.  General nonspecific fatigue.  No chest pains.  Had some nasal congestion during this time.  No documented fever and no chills.  She had chest x-ray on the ninth of this month with no acute findings.  Recent referral to wound center for leg wound.  Also has had foot wound and apparently was written prescription for Cleocin earlier today by wound care specialist.  She had recent admission with question of TIA.  MRI did not show any acute abnormality.  Moderate chronic microvascular ischemic changes.  Moderate parenchymal volume loss.  No evidence for significant intracranial large vessel occlusion or stenosis.  Past Medical History:  Diagnosis Date   Allergic rhinitis    Anemia    Anxiety    Bronchiectasis    Carpal tunnel syndrome 07/13/2015   Bilateral   CVA (cerebral infarction) 10/2007   Right thalamic    Diverticulosis of colon    Gait disorder    GERD (gastroesophageal reflux disease)    History of breast cancer    HTN (hypertension)    Hyperlipidemia    Idiopathic pulmonary fibrosis    Left knee DJD    Migraine    Osteoporosis    Peripheral neuropathy    PMR (polymyalgia rheumatica) (HCC)    Polyneuropathy in other diseases classified  elsewhere ([place]) 02/17/2013   Previous back surgery 10/09/12   Renal insufficiency    Rheumatoid arthritis (HCC)    Seizure disorder (HCC)    Spondylosis, cervical, with myelopathy 08/31/2015   C3-4 myelopathy   Temporal arteritis (HCC)    Right eye blind, on steroids per [facility]/ Dr [name]   Type II or unspecified type diabetes mellitus without mention of complication, not stated as uncontrolled    2nd to steriods   Vitamin D deficiency    Past Surgical History:  Procedure Laterality Date   APPENDECTOMY  01/2021   CARPAL TUNNEL RELEASE Right    CATARACT EXTRACTION     OS - Summer 2010   CERVICAL FUSION  09/24/2008   4 rods and pins in place   CHOLECYSTECTOMY     COLONOSCOPY  12/15/2011   Procedure: COLONOSCOPY;  Surgeon: [name], MD;  Location: [facility];  Service: Endoscopy;  Laterality: N/A;   LUMBAR FUSION  04/24/2010   W/Mechanical fixation - [facility]   LUMBAR FUSION  09/25/2011   rods in hips (to stabilize).   MASTECTOMY     NECK SURGERY     rheumatoid nodule removal     SPINAL FUSION  07/25/2010   T10-L2 interbody fusion / [place]  Left    reports that she has never smoked. She has never used smokeless tobacco. She reports that she does not currently use alcohol. She reports that she does not use drugs. family history includes Arthritis in her father and mother; Brain cancer in her mother; Breast cancer in her sister; Dementia in her father; Hypertension in her father and mother; Lung cancer in her sister. Allergies  Allergen Reactions   Hydromorphone Other (See Comments)    Cognitive changes  "made me unconscious" Dilaudid    Review of Systems  Constitutional:  Positive for malaise/fatigue. Negative for chills and fever.  HENT:  Negative for sinus pain.   Respiratory:  Positive for cough and sputum production. Negative for wheezing.   Cardiovascular:  Negative for chest pain.       Objective:     BP (!) 160/80 (BP Location: Left Arm, Patient Position: Sitting, Cuff Size: Normal)   Pulse (!) 53   Temp 98.2 F (36.8 C) (Oral)   Ht '5\' 1"'$  (1.549 m)   Wt 132 lb 3.2 oz (60 kg)   SpO2 98%   BMI 24.98 kg/m  {Vitals History (Optional):23777}  Physical Exam Vitals reviewed.  Constitutional:      Appearance: Normal appearance.  Cardiovascular:     Rate and Rhythm: Normal rate and regular rhythm.  Pulmonary:     Effort: Pulmonary effort is normal.     Breath sounds: Normal breath sounds.     Comments: He has chronic bibasilar crackles consistent with her chronic lung disease diagnosis. Musculoskeletal:     Right lower leg: No edema.     Left lower leg: No edema.  Neurological:     Mental Status: She is alert.      No results found for any visits on 08/14/22.  {Labs (Optional):23779}  The ASCVD Risk score (Arnett DK, et al., 2019) failed to calculate for the following reasons:   The 2019 ASCVD risk score is only valid for ages 3 to 51    Assessment & Plan:   Persistent cough and malaise.  She states she has productive cough with yellow sputum now for over a month.  Recent chest x-ray showed no pneumonia.  No documented fever.  No respiratory distress.  O2 sat 98% room air and heart rate around 55.  Nontoxic in appearance.  -Given duration of symptoms we discussed starting doxycycline 100 mg twice daily for 7 days. -Plenty of fluids -Consider over-the-counter plain Mucinex twice daily -Follow-up immediately for any fever or increased shortness of breath or if cough not improving next few weeks     Carolann Littler, MD

## 2022-08-14 NOTE — Patient Instructions (Signed)
Follow up immediately for any fever or increased shortness of breath.  Let me know by early next week if no better.

## 2022-08-14 NOTE — Progress Notes (Signed)
[name], [name] ([id number]) [id number] Page 1 of 11 Visit Report for 08/14/2022 Chief Complaint Document Details Patient Name: Date of Service: [name], [name] 08/14/2022 7:45 A M Medical Record Number: [id number] Patient Account Number: [id number] Date of Birth/Sex: Treating RN: Jun 22, 1941 (81 y.o. F) Primary Care Provider: Carolann Littler Other Clinician: Referring Provider: Treating Provider/Extender: [name] in Treatment: 1 Information Obtained from: Patient Chief Complaint Patient seen for complaints of Non-Healing Wound. Electronic Signature(s) Signed: 08/14/2022 8:25:15 AM By: Fredirick Maudlin MD FACS Entered By: Fredirick Maudlin on 08/14/2022 08:25:14 -------------------------------------------------------------------------------- Debridement Details Patient Name: Date of Service: [name], [place] RO [name] 08/14/2022 7:45 A M Medical Record Number: [id number] Patient Account Number: [id number] Date of Birth/Sex: Treating RN: Apr 12, 1941 (81 y.o. [name] Primary Care Provider: Carolann Littler Other Clinician: Referring Provider: Treating Provider/Extender: [name] in Treatment: 1 Debridement Performed for Assessment: Wound #6 Left,Anterior Lower Leg Performed By: Physician Fredirick Maudlin, MD Debridement Type: Debridement Severity of Tissue Pre Debridement: Fat layer exposed Level of Consciousness (Pre-procedure): Awake and Alert Pre-procedure Verification/Time Out Yes - 08:05 Taken: Start Time: 08:05 Pain Control: Lidocaine 4% T opical Solution T Area Debrided (L x W): otal 3 (cm) x 3 (cm) = 9 (cm) Tissue and other material debrided: Non-Viable, Slough, Subcutaneous, Slough Level: Skin/Subcutaneous Tissue Debridement Description: Excisional Instrument: Curette Bleeding: Minimum Hemostasis Achieved: Pressure Response to Treatment: Procedure was  tolerated well Level of Consciousness (Post- Awake and Alert procedure): Post Debridement Measurements of Total Wound Length: (cm) 6 Width: (cm) 6.3 Depth: (cm) 0.1 Volume: (cm) 2.969 Character of Wound/Ulcer Post Debridement: Improved Severity of Tissue Post Debridement: Fat layer exposed Post Procedure Diagnosis Same as Pre-procedure [name] ([id number]) [id number] Page 2 of 11 Notes scribed for Dr. Celine Ahr by Adline Peals, RN Electronic Signature(s) Signed: 08/14/2022 8:42:26 AM By: Fredirick Maudlin MD FACS Signed: 08/14/2022 4:08:33 PM By: Adline Peals Entered By: Adline Peals on 08/14/2022 08:11:15 -------------------------------------------------------------------------------- Debridement Details Patient Name: Date of Service: [name], [place] [name] 08/14/2022 7:45 A M Medical Record Number: [id number] Patient Account Number: [id number] Date of Birth/Sex: Treating RN: 11/07/1940 (81 y.o. Donney Rankins, Lovena Le Primary Care Provider: Carolann Littler Other Clinician: Referring Provider: Treating Provider/Extender: [name] in Treatment: 1 Debridement Performed for Assessment: Wound #7 Left,Plantar Foot Performed By: Physician Fredirick Maudlin, MD Debridement Type: Debridement Level of Consciousness (Pre-procedure): Awake and Alert Pre-procedure Verification/Time Out Yes - 08:05 Taken: Start Time: 08:05 Pain Control: Lidocaine 4% T opical Solution T Area Debrided (L x W): otal 0.5 (cm) x 0.4 (cm) = 0.2 (cm) Tissue and other material debrided: Non-Viable, Callus, Slough, Subcutaneous, Slough Level: Skin/Subcutaneous Tissue Debridement Description: Excisional Instrument: Curette Specimen: Tissue Culture Number of Specimens T aken: 1 Bleeding: Minimum Hemostasis Achieved: Pressure Response to Treatment: Procedure was tolerated well Level of Consciousness (Post- Awake and  Alert procedure): Post Debridement Measurements of Total Wound Length: (cm) 0.5 Width: (cm) 0.4 Depth: (cm) 0.4 Volume: (cm) 0.063 Character of Wound/Ulcer Post Debridement: Improved Post Procedure Diagnosis Same as Pre-procedure Notes scribed for Dr. Celine Ahr by Adline Peals, RN Electronic Signature(s) Signed: 08/14/2022 12:59:49 PM By: Fredirick Maudlin MD FACS Signed: 08/14/2022 4:08:33 PM By: Adline Peals Previous Signature: 08/14/2022 8:42:26 AM Version By: Fredirick Maudlin MD FACS Entered By: Adline Peals on 08/14/2022 09:22:46 HPI Details -------------------------------------------------------------------------------- [name] ([id number]) [id number] Page 3 of 11 Patient Name: Date of Service: [name], [name] 08/14/2022 7:45 A M Medical Record Number: [id number] Patient Account Number: [id number] Date of Birth/Sex: Treating RN: July 10, 1941 (81 y.o. F) Primary Care Provider: Carolann Littler Other Clinician: Referring Provider: Treating Provider/Extender: [name] in Treatment: 1 History of Present Illness HPI Description: ADMISSION 11.14.2023 This is an 81 year old woman on chronic steroids and anticoagulants who fell near the beginning of October. She initially developed a hematoma on her left lower leg which subsequently ruptured. She presented to the emergency department on November 6 with concern for a wound infection due to developing erythema and warmth. The wound was debrided in the ED and clindamycin prescribed along with a recommendation for probiotics. Wet-to-dry dressings were prescribed, as well. She was seen again in the emergency room 3 days later for other symptoms. Photographs in the electronic medical record show that the wound is quite a bit cleaner. Wound care was consulted at that time. They recommended daily soap and water cleanse and Xeroform topped with an ABD and  a Kerlix and Ace wrap. Upon her discharge from the hospital, she was referred to the wound care center for further evaluation and management. ABI in clinic was 1.25. On intake, she was also noted to have an ulcer on her left midfoot associated with a fallen arch and callus. On her left anterior tibial surface, there is a large ovoid wound with some evidence of epithelialization. There is granulation tissue and slough present. No concern for infection. On the left midfoot, there is a large callus, in the center of which there is an ulceration exposing the fat layer. No malodor or purulent drainage. 08/14/2022: The left anterior tibial wound is smaller and cleaner. There is more epithelialization present. There is some slough accumulation on the surface. When her dressing was removed from the midfoot wound, pus drained out. The patient did say she had been having quite a bit more pain in her foot during the week. Electronic Signature(s) Signed: 08/14/2022 8:26:05 AM By: Fredirick Maudlin MD FACS Entered By: Fredirick Maudlin on 08/14/2022 08:26:04 -------------------------------------------------------------------------------- Physical Exam Details Patient Name: Date of Service: [name], [place] RO [name] 08/14/2022 7:45 A M Medical Record Number: [id number] Patient Account Number: [id number] Date of Birth/Sex: Treating RN: 20-Dec-1940 (81 y.o. F) Primary Care Provider: Carolann Littler Other Clinician: Referring Provider: Treating Provider/Extender: [name] in Treatment: 1 Constitutional . . . . No acute distress. Respiratory Normal work of breathing on room air. Notes 08/14/2022: The left anterior tibial wound is smaller and cleaner. There is more epithelialization present. There is some slough accumulation on the surface. When her dressing was removed from the midfoot wound, pus drained out. Electronic Signature(s) Signed: 08/14/2022 8:26:34 AM By: Fredirick Maudlin MD FACS Entered By: Fredirick Maudlin on 08/14/2022 08:26:34 -------------------------------------------------------------------------------- Physician Orders Details Patient Name: Date of Service: [name], [place] [name] 08/14/2022 7:45 A M Medical Record Number: [id number] Patient Account Number: [id number] Date of Birth/Sex: Treating RN: 06-Apr-1941 (81 y[address]o. [address], [place] [address] ([id number]) [phone].pdf Page 4 of 11 Primary Care Provider: Carolann Littler Other Clinician: Referring Provider: Treating Provider/Extender: [name] in Treatment: 1 Verbal / Phone Orders: No Diagnosis Coding ICD-10 Coding Code Description [phone] Non-pressure chronic ulcer of other part of left lower leg with fat layer exposed L97.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 G63 Polyneuropathy in diseases classified elsewhere N18.2 Chronic kidney disease, stage 2 (mild) M06.9 Rheumatoid arthritis, unspecified  I10 Essential (primary) hypertension Z79.52 Long term (current) use of systemic steroids Follow-up Appointments ppointment in 1 week. - Dr. Celine Ahr RM 1 Return A Anesthetic Wound #6 Left,Anterior Lower Leg (In clinic) Topical Lidocaine 4% applied to wound bed Bathing/ Shower/ Hygiene May shower with protection but do not get wound dressing(s) wet. - may be purchased at [facility] or [facility] Edema Control - Lymphedema / SCD / Other Elevate legs to the level of the heart or above for 30 minutes daily and/or when sitting, a frequency of: - throughout the day while sitting Avoid standing for long periods of time. Patient to wear own compression stockings every day. - right leg Exercise regularly Wound Treatment Wound #6 - Lower Leg Wound Laterality: Left, Anterior Cleanser: Soap and Water 1 x Per Week/30 Days Discharge Instructions: May shower and wash wound with dial antibacterial soap  and water prior to dressing change. Cleanser: Wound Cleanser 1 x Per Week/30 Days Discharge Instructions: Cleanse the wound with wound cleanser prior to applying a clean dressing using gauze sponges, not tissue or cotton balls. Peri-Wound Care: Sween Lotion (Moisturizing lotion) 1 x Per Week/30 Days Discharge Instructions: Apply moisturizing lotion as directed Prim Dressing: KerraCel Ag Gelling Fiber Dressing, 2x2 in (silver alginate) 1 x Per Week/30 Days ary Discharge Instructions: Apply silver alginate to wound bed as instructed Secondary Dressing: ABD Pad, 8x10 1 x Per Week/30 Days Discharge Instructions: Apply over primary dressing as directed. Compression Wrap: ThreePress (3 layer compression wrap) 1 x Per Week/30 Days Discharge Instructions: Apply three layer compression as directed. Wound #7 - Foot Wound Laterality: Plantar, Left Cleanser: Soap and Water 1 x Per Week/30 Days Discharge Instructions: May shower and wash wound with dial antibacterial soap and water prior to dressing change. Cleanser: Wound Cleanser 1 x Per Week/30 Days Discharge Instructions: Cleanse the wound with wound cleanser prior to applying a clean dressing using gauze sponges, not tissue or cotton balls. Topical: Gentamicin 1 x Per Week/30 Days Discharge Instructions: As directed by physician Prim Dressing: KerraCel Ag Gelling Fiber Dressing, 2x2 in (silver alginate) 1 x Per Week/30 Days ary Discharge Instructions: Apply silver alginate to wound bed as instructed Secondary Dressing: Optifoam Non-Adhesive Dressing, 4x4 in 1 x Per Week/30 Days Discharge Instructions: Apply over primary dressing cut to make foam donut Secondary Dressing: Woven Gauze Sponges 2x2 in 1 x Per Week/30 Days Discharge Instructions: Apply over primary dressing as directed. Secured With: 91M Medipore H Soft Cloth Surgical T ape, 4 x 10 (in/yd) 1 x Per Week/30 Days Discharge Instructions: Secure with tape as directed. MIQUELA, COSTABILE  ([id number]) [id number] Page 5 of 11 Laboratory naerobe culture (MICRO) - PCR of nonhealing wound to left foot - (ICD10 L97.422 - Non- Bacteria identified in Unspecified specimen by A pressure chronic ulcer of left heel and midfoot with fat layer exposed) LOINC Code: 818-2 Convenience Name: Anaerobic culture Patient Medications llergies: hydromorphone A Notifications Medication Indication Start End 08/14/2022 lidocaine DOSE topical 4 % cream - cream topical 08/14/2022 amoxicillin-pot clavulanate DOSE oral 875 mg-125 mg tablet - 1 tab p.o. twice daily x 10 days Electronic Signature(s) Signed: 08/14/2022 12:59:49 PM By: Fredirick Maudlin MD FACS Signed: 08/14/2022 4:08:33 PM By: Adline Peals Previous Signature: 08/14/2022 8:28:47 AM Version By: Fredirick Maudlin MD FACS Entered By: Adline Peals on 08/14/2022 09:23:30 -------------------------------------------------------------------------------- Problem List Details Patient Name: Date of Service: [name], [place] RO [name] 08/14/2022 7:45 A M Medical Record Number: [id number] Patient Account Number: [id number] Date of Birth/Sex: Treating RN:  1941-03-09 (81 y.o. F) Primary Care Provider: Carolann Littler Other Clinician: Referring Provider: Treating Provider/Extender: [name] in Treatment: 1 Active Problems ICD-10 Encounter Code Description Active Date MDM Diagnosis [phone] Non-pressure chronic ulcer of other part of left lower leg with fat layer exposed11/14/2023 No Yes L97.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 08/07/2022 No Yes G63 Polyneuropathy in diseases classified elsewhere 08/07/2022 No Yes N18.2 Chronic kidney disease, stage 2 (mild) 08/07/2022 No Yes M06.9 Rheumatoid arthritis, unspecified 08/07/2022 No Yes I10 Essential (primary) hypertension 08/07/2022 No Yes Z79.52 Long term (current) use of systemic  steroids 08/07/2022 No Yes TIJAH, HANE ([id number]) [id number] Page 6 of 11 Inactive Problems Resolved Problems Electronic Signature(s) Signed: 08/14/2022 8:25:00 AM By: Fredirick Maudlin MD FACS Entered By: Fredirick Maudlin on 08/14/2022 08:25:00 -------------------------------------------------------------------------------- Progress Note Details Patient Name: Date of Service: [name], [place] RO [name] 08/14/2022 7:45 A M Medical Record Number: [id number] Patient Account Number: [id number] Date of Birth/Sex: Treating RN: 1941/06/21 (81 y.o. F) Primary Care Provider: Carolann Littler Other Clinician: Referring Provider: Treating Provider/Extender: [name] in Treatment: 1 Subjective Chief Complaint Information obtained from Patient Patient seen for complaints of Non-Healing Wound. History of Present Illness (HPI) ADMISSION 11.14.2023 This is an 81 year old woman on chronic steroids and anticoagulants who fell near the beginning of October. She initially developed a hematoma on her left lower leg which subsequently ruptured. She presented to the emergency department on November 6 with concern for a wound infection due to developing erythema and warmth. The wound was debrided in the ED and clindamycin prescribed along with a recommendation for probiotics. Wet-to-dry dressings were prescribed, as well. She was seen again in the emergency room 3 days later for other symptoms. Photographs in the electronic medical record show that the wound is quite a bit cleaner. Wound care was consulted at that time. They recommended daily soap and water cleanse and Xeroform topped with an ABD and a Kerlix and Ace wrap. Upon her discharge from the hospital, she was referred to the wound care center for further evaluation and management. ABI in clinic was 1.25. On intake, she was also noted to have an ulcer on her left midfoot associated with a  fallen arch and callus. On her left anterior tibial surface, there is a large ovoid wound with some evidence of epithelialization. There is granulation tissue and slough present. No concern for infection. On the left midfoot, there is a large callus, in the center of which there is an ulceration exposing the fat layer. No malodor or purulent drainage. 08/14/2022: The left anterior tibial wound is smaller and cleaner. There is more epithelialization present. There is some slough accumulation on the surface. When her dressing was removed from the midfoot wound, pus drained out. The patient did say she had been having quite a bit more pain in her foot during the week. Patient History Information obtained from Patient, Chart. Family History Cancer - Mother,Siblings,Maternal Grandparents, Hypertension - Father, Seizures - Mother, Stroke - Mother, No family history of Diabetes, Heart Disease, Hereditary Spherocytosis, Kidney Disease, Lung Disease, Thyroid Problems, Tuberculosis. Social History Never smoker, Marital Status - Widowed, Alcohol Use - Rarely, Drug Use - No History, Caffeine Use - Daily - tea. Medical History Eyes Patient has history of Cataracts - bil extracted Denies history of Glaucoma, Optic Neuritis Cardiovascular Patient has history of Hypertension Genitourinary Denies history of End Stage Renal Disease Integumentary (Skin) Denies history of History  of Burn Musculoskeletal Patient has history of Rheumatoid Arthritis Neurologic Patient has history of Seizure Disorder - hx Oncologic Denies history of Received Chemotherapy, Received Radiation Psychiatric Denies history of [place] [address] NEVE, BRANSCOMB ([id number]) [id number] Page 7 of 11 Medical A Surgical History Notes nd Respiratory hx pulmonary fibrosis Cardiovascular hx temporal arteritis, hyperlipidemia Gastrointestinal GERD, diverticulosis Genitourinary CRI  stage 2 Musculoskeletal osteoporosis Neurologic TIA, CVA, chronic back pain Oncologic hx breast CA Objective Constitutional No acute distress. Vitals Time Taken: 7:46 AM, Height: 61 in, Weight: 128 lbs, BMI: 24.2, Temperature: 98.4 F, Pulse: 96 bpm, Respiratory Rate: 18 breaths/min, Blood Pressure: 136/81 mmHg. Respiratory Normal work of breathing on room air. General Notes: 08/14/2022: The left anterior tibial wound is smaller and cleaner. There is more epithelialization present. There is some slough accumulation on the surface. When her dressing was removed from the midfoot wound, pus drained out. Integumentary (Hair, Skin) Wound #6 status is Open. Original cause of wound was Trauma. The date acquired was: 07/08/2022. The wound has been in treatment 1 weeks. The wound is located on the Left,Anterior Lower Leg. The wound measures 6cm length x 6.3cm width x 0.1cm depth; 29.688cm^2 area and 2.969cm^3 volume. There is Fat Layer (Subcutaneous Tissue) exposed. There is no tunneling or undermining noted. There is a medium amount of serosanguineous drainage noted. The wound margin is flat and intact. There is large (67-100%) red granulation within the wound bed. There is a small (1-33%) amount of necrotic tissue within the wound bed including Adherent Slough. The periwound skin appearance had no abnormalities noted for texture. The periwound skin appearance had no abnormalities noted for moisture. The periwound skin appearance exhibited: Hemosiderin Staining. The periwound skin appearance did not exhibit: Atrophie Blanche, Cyanosis, Ecchymosis, Mottled, Pallor, Rubor, Erythema. Periwound temperature was noted as No Abnormality. The periwound has tenderness on palpation. Wound #7 status is Open. Original cause of wound was Gradually Appeared. The date acquired was: 06/24/2022. The wound has been in treatment 1 weeks. The wound is located on the [place]. The wound measures 0.5cm length x  0.4cm width x 0.4cm depth; 0.157cm^2 area and 0.063cm^3 volume. There is Fat Layer (Subcutaneous Tissue) exposed. There is no tunneling noted, however, there is undermining starting at 12:00 and ending at 12:00 with a maximum distance of 0.3cm. There is a medium amount of purulent drainage noted. The wound margin is thickened. There is large (67-100%) red granulation within the wound bed. There is a small (1-33%) amount of necrotic tissue within the wound bed including Adherent Slough. The periwound skin appearance had no abnormalities noted for moisture. The periwound skin appearance had no abnormalities noted for color. The periwound skin appearance exhibited: Callus. Periwound temperature was noted as No Abnormality. 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 There was a Excisional Skin/Subcutaneous Tissue Debridement with a total area of 9 sq cm performed by Fredirick Maudlin, MD. With the following instrument(s): Curette to remove Non-Viable tissue/material. Material removed includes Subcutaneous Tissue and Slough and after achieving pain control using Lidocaine 4% T opical Solution. No specimens  were taken. A time out was conducted at 08:05, prior to the start of the procedure. A Minimum amount of bleeding was controlled with Pressure. The procedure was tolerated well. Post Debridement Measurements: 6cm length x 6.3cm width x 0.1cm depth; 2.969cm^3 volume. RACHE, KLIMASZEWSKI  ([id number]) [id number] Page 8 of 11 Character of Wound/Ulcer Post Debridement is improved. Severity of Tissue Post Debridement is: Fat layer exposed. Post procedure Diagnosis Wound #6: Same as Pre-Procedure General Notes: scribed for Dr. Celine Ahr by Adline Peals, RN. Pre-procedure diagnosis of Wound #6 is a Venous Leg Ulcer located on the Left,Anterior Lower Leg . There was a Three Layer Compression Therapy Procedure by Adline Peals, RN. Post procedure Diagnosis Wound #6: Same as Pre-Procedure Wound #7 Pre-procedure diagnosis of Wound #7 is a Neuropathic Ulcer-Non Diabetic located on the Left,Plantar Foot . There was a Excisional Skin/Subcutaneous Tissue Debridement with a total area of 0.2 sq cm performed by Fredirick Maudlin, MD. With the following instrument(s): Curette to remove Non-Viable tissue/material. Material removed includes Callus, Subcutaneous Tissue, and Slough after achieving pain control using Lidocaine 4% T opical Solution. 1 specimen was taken by a Tissue Culture and sent to the lab per facility protocol. A time out was conducted at 08:05, prior to the start of the procedure. A Minimum amount of bleeding was controlled with Pressure. The procedure was tolerated well. Post Debridement Measurements: 0.5cm length x 0.4cm width x 0.4cm depth; 0.063cm^3 volume. Character of Wound/Ulcer Post Debridement is improved. Post procedure Diagnosis Wound #7: Same as Pre-Procedure General Notes: scribed for Dr. Celine Ahr by Adline Peals, RN. Plan Follow-up Appointments: Return Appointment in 1 week. - Dr. Celine Ahr RM 1 Anesthetic: Wound #6 Left,Anterior Lower Leg: (In clinic) Topical Lidocaine 4% applied to wound bed Bathing/ Shower/ Hygiene: May shower with protection but do not get wound dressing(s) wet. - may be purchased at [facility] or [facility] Edema Control - Lymphedema / SCD / Other: Elevate legs to the level of the heart or above for 30 minutes  daily and/or when sitting, a frequency of: - throughout the day while sitting Avoid standing for long periods of time. Patient to wear own compression stockings every day. - right leg Exercise regularly Laboratory ordered were: Anaerobic culture - PCR of nonhealing wound to left foot The following medication(s) was prescribed: lidocaine topical 4 % cream cream topical was prescribed at facility amoxicillin-pot clavulanate oral 875 mg-125 mg tablet 1 tab p.o. twice daily x 10 days starting 08/14/2022 WOUND #6: - Lower Leg Wound Laterality: Left, Anterior Cleanser: Soap and Water 1 x Per Week/30 Days Discharge Instructions: May shower and wash wound with dial antibacterial soap and water prior to dressing change. Cleanser: Wound Cleanser 1 x Per Week/30 Days Discharge Instructions: Cleanse the wound with wound cleanser prior to applying a clean dressing using gauze sponges, not tissue or cotton balls. Peri-Wound Care: Sween Lotion (Moisturizing lotion) 1 x Per Week/30 Days Discharge Instructions: Apply moisturizing lotion as directed Prim Dressing: KerraCel Ag Gelling Fiber Dressing, 2x2 in (silver alginate) 1 x Per Week/30 Days ary Discharge Instructions: Apply silver alginate to wound bed as instructed Secondary Dressing: ABD Pad, 8x10 1 x Per Week/30 Days Discharge Instructions: Apply over primary dressing as directed. Com pression Wrap: ThreePress (3 layer compression wrap) 1 x Per Week/30 Days Discharge Instructions: Apply three layer compression as directed. WOUND #7: - Foot Wound Laterality: Plantar, Left Cleanser: Soap and Water 1 x Per Week/30 Days Discharge Instructions: May shower and wash wound with dial antibacterial soap and  water prior to dressing change. Cleanser: Wound Cleanser 1 x Per Week/30 Days Discharge Instructions: Cleanse the wound with wound cleanser prior to applying a clean dressing using gauze sponges, not tissue or cotton balls. Topical: Gentamicin 1 x Per  Week/30 Days Discharge Instructions: As directed by physician Prim Dressing: KerraCel Ag Gelling Fiber Dressing, 2x2 in (silver alginate) 1 x Per Week/30 Days ary Discharge Instructions: Apply silver alginate to wound bed as instructed Secondary Dressing: Optifoam Non-Adhesive Dressing, 4x4 in 1 x Per Week/30 Days Discharge Instructions: Apply over primary dressing cut to make foam donut Secondary Dressing: Woven Gauze Sponges 2x2 in 1 x Per Week/30 Days Discharge Instructions: Apply over primary dressing as directed. Secured With: 18M Medipore H Soft Cloth Surgical T ape, 4 x 10 (in/yd) 1 x Per Week/30 Days Discharge Instructions: Secure with tape as directed. 08/14/2022: The left anterior tibial wound is smaller and cleaner. There is more epithelialization present. There is some slough accumulation on the surface. When her dressing was removed from the midfoot wound, pus drained out. I used a curette to debride callus, nonviable subcutaneous tissue, and slough from the foot wound. I then took a culture. I empirically prescribed Augmentin and will tailor antibiotic therapy appropriately once culture data returned. We will pack the wound with gentamicin and silver alginate. I debrided slough and subcutaneous tissue from the anterior tibial wound. We will continue silver alginate and 3 layer compression for this wound. She will follow-up in 1 week. Electronic Signature(s) Signed: 08/14/2022 3:41:18 PM By: Fredirick Maudlin MD FACS Signed: 08/14/2022 4:08:33 PM By: Adline Peals Previous Signature: 08/14/2022 12:59:49 PM Version By: Fredirick Maudlin MD FACS Previous Signature: 08/14/2022 8:29:52 AM Version By: Fredirick Maudlin MD FACS [name] ([id number]) [id number] Page 9 of 11 Previous Signature: 08/14/2022 8:29:52 AM Version By: Fredirick Maudlin MD FACS Entered By: Adline Peals on 08/14/2022  15:10:17 -------------------------------------------------------------------------------- HxROS Details Patient Name: Date of Service: [name], [place] RO [name] 08/14/2022 7:45 A M Medical Record Number: [id number] Patient Account Number: [id number] Date of Birth/Sex: Treating RN: 1941-07-12 (81 y.o. F) Primary Care Provider: Carolann Littler Other Clinician: Referring Provider: Treating Provider/Extender: [name] in Treatment: 1 Information Obtained From Patient Chart Eyes Medical History: Positive for: Cataracts - bil extracted Negative for: Glaucoma; Optic Neuritis Respiratory Medical History: Past Medical History Notes: hx pulmonary fibrosis Cardiovascular Medical History: Positive for: Hypertension Past Medical History Notes: hx temporal arteritis, hyperlipidemia Gastrointestinal Medical History: Past Medical History Notes: GERD, diverticulosis Genitourinary Medical History: Negative for: End Stage Renal Disease Past Medical History Notes: CRI stage 2 Integumentary (Skin) Medical History: Negative for: History of Burn Musculoskeletal Medical History: Positive for: Rheumatoid Arthritis Past Medical History Notes: osteoporosis Neurologic Medical History: Positive for: Seizure Disorder - hx Past Medical History Notes: TIA, CVA, chronic back pain Oncologic Medical History: Negative for: Received Chemotherapy; Received Radiation Past Medical History Notes: hx breast CA LORENNA, LURRY ([id number]) [id number] Page 10 of 11 Psychiatric Medical History: Negative for: Anorexia/bulimia; Confinement Anxiety HBO Extended History Items Eyes: Cataracts Immunizations Pneumococcal Vaccine: Received Pneumococcal Vaccination: Yes Received Pneumococcal Vaccination On or After 60th Birthday: Yes Implantable Devices No devices added Family and Social History Cancer: Yes - Mother,Siblings,Maternal  Grandparents; Diabetes: No; Heart Disease: No; Hereditary Spherocytosis: No; Hypertension: Yes - Father; Kidney Disease: No; Lung Disease: No; Seizures: Yes - Mother; Stroke: Yes - Mother; Thyroid Problems: No; Tuberculosis: No; Never smoker; Marital Status - Widowed; Alcohol Use: Rarely; Drug Use: No History; Caffeine Use:  Daily - tea; Financial Concerns: No; Food, Clothing or Shelter Needs: No; Support System Lacking: No Electronic Signature(s) Signed: 08/14/2022 8:42:26 AM By: Fredirick Maudlin MD FACS Entered By: Fredirick Maudlin on 08/14/2022 08:26:10 -------------------------------------------------------------------------------- SuperBill Details Patient Name: Date of Service: [name], [place] RO [name] 08/14/2022 Medical Record Number: [id number] Patient Account Number: [id number] Date of Birth/Sex: Treating RN: 29-Sep-1940 (81 y.o. 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 G63 Polyneuropathy in diseases classified elsewhere N18.2 Chronic kidney disease, stage 2 (mild) M06.9 Rheumatoid arthritis, unspecified I10 Essential (primary) hypertension Z79.52 Long term (current) use of systemic steroids Facility Procedures : CPT4 Code: [id number] Description: 11042 - DEB SUBQ TISSUE 20 SQ CM/< ICD-10 Diagnosis Description L97.822 Non-pressure chronic ulcer of other part of left lower leg with fat layer expo L97.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 Modifier: sed Quantity: 1 Physician Procedures : CPT4 Code Description Modifier [id number] 99214 - WC PHYS LEVEL 4 - EST PT 25 ICD-10 Diagnosis Description L97.822  Non-pressure chronic ulcer of other part of left lower leg with fat layer exposed VIELKA, KLINEDINST ([id number])  [id number] L97.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 G63 Polyneuropathy in diseases classified elsewhere Z79.52 Long term (current) use of systemic steroids Quantity: 1 227.pdf Page 11 of 11 : [id number] 11042 - WC PHYS SUBQ TISS 20 SQ CM ICD-10 Diagnosis Description L97.822 Non-pressure chronic ulcer of other part of left lower leg with fat layer exposed L97.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

## 2022-08-14 NOTE — Progress Notes (Signed)
[name], [name] ([id number]) [id number] Page 1 of 10 Visit Report for 08/14/2022 Arrival Information Details Patient Name: Date of Service: [name], [name] 08/14/2022 7:45 A M Medical Record Number: [id number] Patient Account Number: [id number] Date of Birth/Sex: Treating RN: [birth year] (81 y.o. [name] Primary Care Ambre Kobayashi: Carolann Littler Other Clinician: Referring Mathis Cashman: Treating [name]/Extender: [name] in Treatment: 1 Visit Information History Since Last Visit Added or deleted any medications: No Patient Arrived: [name] Any new allergies or adverse reactions: No Arrival Time: 07:41 Had a fall or experienced change in No Accompanied By: friend activities of daily living that may affect Transfer Assistance: None risk of falls: Patient Identification Verified: Yes Signs or symptoms of abuse/neglect since last visito No Secondary Verification Process Completed: Yes Hospitalized since last visit: No Patient Requires Transmission-Based Precautions: No Implantable device outside of the clinic excluding No Patient Has Alerts: No cellular tissue based products placed in the center since last visit: Has Dressing in Place as Prescribed: Yes Has Compression in Place as Prescribed: Yes Pain Present Now: Yes Electronic Signature(s) Signed: 08/14/2022 4:08:33 PM By: Adline Peals Entered By: Adline Peals on 08/14/2022 07:46:27 -------------------------------------------------------------------------------- Compression Therapy Details Patient Name: Date of Service: [name], [place] RO [name] 08/14/2022 7:45 A M Medical Record Number: [id number] Patient Account Number: [id number] Date of Birth/Sex: Treating RN: 09-05-1941 (81 y.o. [name] Primary Care [name]: Carolann Littler Other Clinician: Referring [name]: Treating Mahesh Sizemore/Extender: [name] in Treatment: 1 Compression Therapy Performed for Wound Assessment: Wound #6 Left,Anterior Lower Leg Performed By: Clinician Adline Peals, RN Compression Type: Three Layer Post Procedure Diagnosis Same as Pre-procedure Electronic Signature(s) Signed: 08/14/2022 4:08:33 PM By: Adline Peals Entered By: Adline Peals on 08/14/2022 15:09:56 [name] ([id number]) [id number] Page 2 of 10 -------------------------------------------------------------------------------- Encounter Discharge Information Details Patient Name: Date of Service: [name] RO [name] 08/14/2022 7:45 A M Medical Record Number: [id number] Patient Account Number: [id number] Date of Birth/Sex: Treating RN: October 01, 1940 (81 y.o. [name] Primary Care [name]: Carolann Littler Other Clinician: Referring [name]: Treating [name]/Extender: [name] in Treatment: 1 Encounter Discharge Information Items Post Procedure Vitals Discharge Condition: Stable Temperature (F): 98.4 Ambulatory Status: Walker Pulse (bpm): 96 Discharge Destination: Home Respiratory Rate (breaths/min): 18 Transportation: Private Auto Blood Pressure (mmHg): 136/81 Accompanied By: friend Schedule Follow-up Appointment: Yes Clinical Summary of Care: Patient Declined Electronic Signature(s) Signed: 08/14/2022 4:08:33 PM By: Adline Peals Entered By: Adline Peals on 08/14/2022 09:17:33 -------------------------------------------------------------------------------- Lower Extremity Assessment Details Patient Name: Date of Service: [name], [place] [name] 08/14/2022 7:45 A M Medical Record Number: [id number] Patient Account Number: [id number] Date of Birth/Sex: Treating RN: Jan 08, 1941 (81 y.o. [name] Primary Care Brandi Armato: Carolann Littler Other Clinician: Referring Magaline Steinberg: Treating [name]/Extender: [name] in Treatment: 1 Edema Assessment Assessed: [Left: No] Patrice Paradise: No] Edema: [Left: Ye] [Right: s] Calf Left: Right: Point of Measurement: From Medial Instep 36 cm Ankle Left: Right: Point of Measurement: From Medial Instep 22.8 cm Vascular Assessment Pulses: Dorsalis Pedis Palpable: [Left:Yes] Electronic Signature(s) Signed: 08/14/2022 4:08:33 PM By: Adline Peals Entered By: Adline Peals on 08/14/2022 07:54:58 Multi Wound Chart Details -------------------------------------------------------------------------------- [name] ([id number]) [id number] Page 3 of 10 Patient Name: Date of Service: [name], [name] 08/14/2022 7:45 A M Medical Record Number: [id number] Patient Account Number: [id number] Date of Birth/Sex: Treating RN: [birth year] (81 y.o. F)  Primary Care [name]: Carolann Littler Other Clinician: Referring [name]: Treating [name]/Extender: [name] in Treatment: 1 Vital Signs Height(in): 61 Pulse(bpm): 96 Weight(lbs): 128 Blood Pressure(mmHg): 136/81 Body Mass Index(BMI): 24.2 Temperature(F): 98.4 Respiratory Rate(breaths/min): 18 Wound Assessments Wound Number: 6 7 N/A Photos: N/A Left, Anterior Lower Leg Left, Plantar Foot N/A Wound Location: Trauma Gradually Appeared N/A Wounding Event: Venous Leg Ulcer Neuropathic Ulcer-Non Diabetic N/A Primary Etiology: Cataracts, Hypertension, Rheumatoid Cataracts, Hypertension, Rheumatoid N/A Comorbid History: Arthritis, Seizure Disorder Arthritis, Seizure Disorder 07/08/2022 06/24/2022 N/A Date Acquired: 1 1 N/A Weeks of Treatment: Open Open N/A Wound Status: No No N/A Wound Recurrence: 6x6.3x0.1 0.5x0.4x0.4 N/A Measurements L x W x D (cm) 29.688 0.157 N/A A (cm) : rea 2.969 0.063 N/A Volume (cm) : 9.10% -67.00% N/A % Reduction in A rea: 9.10% -125.00% N/A % Reduction in  Volume: 12 Starting Position 1 (o'clock): 12 Ending Position 1 (o'clock): 0.3 Maximum Distance 1 (cm): No Yes N/A Undermining: Full Thickness Without Exposed Full Thickness Without Exposed N/A Classification: Support Structures Support Structures Medium Medium N/A Exudate A mount: Serosanguineous Purulent N/A Exudate Type: red, brown yellow, brown, green N/A Exudate Color: Flat and Intact Thickened N/A Wound Margin: Large (67-100%) Large (67-100%) N/A Granulation A mount: Red Red N/A Granulation Quality: Small (1-33%) Small (1-33%) N/A Necrotic A mount: Fat Layer (Subcutaneous Tissue): Yes Fat Layer (Subcutaneous Tissue): Yes N/A Exposed Structures: Fascia: No Fascia: No Tendon: No Tendon: No Muscle: No Muscle: No Joint: No Joint: No Bone: No Bone: No Medium (34-66%) None N/A Epithelialization: Debridement - Excisional Debridement - Excisional N/A Debridement: Pre-procedure Verification/Time Out 08:05 08:05 N/A Taken: Lidocaine 4% Topical Solution Lidocaine 4% Topical Solution N/A Pain Control: Subcutaneous, Slough Callus, Subcutaneous, Slough N/A Tissue Debrided: Skin/Subcutaneous Tissue Skin/Subcutaneous Tissue N/A Level: 9 0.2 N/A Debridement A (sq cm): rea Curette Curette N/A Instrument: Minimum Minimum N/A Bleeding: Pressure Pressure N/A Hemostasis A chieved: Procedure was tolerated well Procedure was tolerated well N/A Debridement Treatment Response: 6x6.3x0.1 0.5x0.4x0.4 N/A Post Debridement Measurements L x W x D (cm) 2.969 0.063 N/A Post Debridement Volume: (cm) Excoriation: No Callus: Yes N/A Periwound Skin Texture: Induration: No Callus: No Crepitus: No Rash: No Scarring: No Maceration: No No Abnormalities Noted N/A Periwound Skin Moisture: Dry/Scaly: No Hemosiderin Staining: Yes No Abnormalities Noted N/A Periwound Skin ColorMARLISA, CARIDI ([id number]) [id number] Page 4 of 10 Atrophie Blanche:  No Cyanosis: No Ecchymosis: No Erythema: No Mottled: No Pallor: No Rubor: No No Abnormality No Abnormality N/A Temperature: Yes N/A N/A Tenderness on Palpation: Debridement Debridement N/A Procedures Performed: Treatment Notes Electronic Signature(s) Signed: 08/14/2022 8:25:08 AM By: Fredirick Maudlin MD FACS Entered By: Fredirick Maudlin on 08/14/2022 08:25:07 -------------------------------------------------------------------------------- Nelson Details Patient Name: Date of Service: [name], [place] [name] 08/14/2022 7:45 A M Medical Record Number: [id number] Patient Account Number: [id number] Date of Birth/Sex: Treating RN: 11-19-1940 (81 y.o. [name] Primary Care Gerald Kuehl: Carolann Littler Other Clinician: Referring Vercie Pokorny: Treating Terika Pillard/Extender: [name] in Treatment: 1 Multidisciplinary Care Plan reviewed with physician Active Inactive Abuse / Safety / Falls / Self Care Management Nursing Diagnoses: History of Falls Potential for falls Goals: Patient/caregiver will verbalize/demonstrate measures taken to prevent injury and/or falls Date Initiated: 08/07/2022 Target Resolution Date: 09/04/2022 Goal Status: Active Interventions: Assess fall risk on admission and as needed Assess impairment of mobility on admission and as needed per policy Notes: Venous Leg Ulcer Nursing Diagnoses: Knowledge deficit related to disease process and management Potential  for venous Insuffiency (use before diagnosis confirmed) Goals: Patient will maintain optimal edema control Date Initiated: 08/07/2022 Target Resolution Date: 09/05/2022 Goal Status: Active Interventions: Assess peripheral edema status every visit. Compression as ordered Provide education on venous insufficiency Treatment Activities: Therapeutic compression applied : 08/07/2022 Notes: QUINLEY, NESLER ([id number])  [id number] Page 5 of 10 Wound/Skin Impairment Nursing Diagnoses: Impaired tissue integrity Knowledge deficit related to ulceration/compromised skin integrity Goals: Patient/caregiver will verbalize understanding of skin care regimen Date Initiated: 08/07/2022 Target Resolution Date: 09/04/2022 Goal Status: Active Ulcer/skin breakdown will have a volume reduction of 30% by week 4 Date Initiated: 08/07/2022 Target Resolution Date: 09/04/2022 Goal Status: Active Interventions: Assess patient/caregiver ability to obtain necessary supplies Assess patient/caregiver ability to perform ulcer/skin care regimen upon admission and as needed Assess ulceration(s) every visit Provide education on ulcer and skin care Treatment Activities: Skin care regimen initiated : 08/07/2022 Topical wound management initiated : 08/07/2022 Notes: Electronic Signature(s) Signed: 08/14/2022 4:08:33 PM By: Adline Peals Entered By: Adline Peals on 08/14/2022 08:11:28 -------------------------------------------------------------------------------- Pain Assessment Details Patient Name: Date of Service: [name] RO [name] 08/14/2022 7:45 A M Medical Record Number: [id number] Patient Account Number: [id number] Date of Birth/Sex: Treating RN: [birth year] (81 y.o. [name] Primary Care Phillippe Orlick: Carolann Littler Other Clinician: Referring Adelbert Gaspard: Treating Ruvi Fullenwider/Extender: [name] in Treatment: 1 Active Problems Location of Pain Severity and Description of Pain Patient Has Paino Yes Site Locations Pain Location: Pain in Ulcers Duration of the Pain. Constant / Intermittento Intermittent Rate the pain. Current Pain Level: 9 Pain Management and Medication Current Pain Management: TIERRIA, WATSON ([id number]) [id number] Page 6 of 10 Electronic Signature(s) Signed: 08/14/2022 4:08:33 PM By:  Sabas Sous By: Adline Peals on 08/14/2022 07:47:00 -------------------------------------------------------------------------------- Patient/Caregiver Education Details Patient Name: Date of Service: [name] RO [name] 11/21/2023andnbsp7:45 A M Medical Record Number: [id number] Patient Account Number: [id number] Date of Birth/Gender: Treating RN: 25-May-1941 (81 y.o. [name] Primary Care Physician: Carolann Littler Other Clinician: Referring Physician: Treating Physician/Extender: [name] in Treatment: 1 Education Assessment Education Provided To: Patient Education Topics Provided Wound/Skin Impairment: Methods: Explain/Verbal Responses: Reinforcements needed, State content correctly Electronic Signature(s) Signed: 08/14/2022 4:08:33 PM By: Adline Peals Entered By: Adline Peals on 08/14/2022 08:11:37 -------------------------------------------------------------------------------- Wound Assessment Details Patient Name: Date of Service: [name] RO [name] 08/14/2022 7:45 A M Medical Record Number: [id number] Patient Account Number: [id number] Date of Birth/Sex: Treating RN: 08/01/1941 (81 y.o. [name] Primary Care Amarius Toto: Carolann Littler Other Clinician: Referring Contina Strain: Treating Charm Stenner/Extender: [name] in Treatment: 1 Wound Status Wound Number: 6 Primary Venous Leg Ulcer Etiology: Wound Location: Left, Anterior Lower Leg Wound Status: Open Wounding Event: Trauma Comorbid Cataracts, Hypertension, Rheumatoid Arthritis, Seizure Date Acquired: 07/08/2022 History: Disorder Weeks Of Treatment: 1 Clustered Wound: No Photos LELLA, MULLANY ([id number]) [id number] Page 7 of 10 Wound Measurements Length: (cm) 6 Width: (cm) 6.3 Depth: (cm) 0.1 Area: (cm) 29.688 Volume: (cm) 2.969 % Reduction in Area: 9.1% %  Reduction in Volume: 9.1% Epithelialization: Medium (34-66%) Tunneling: No Undermining: No Wound Description Classification: Full Thickness Without Exposed Support Structures Wound Margin: Flat and Intact Exudate Amount: Medium Exudate Type: Serosanguineous Exudate Color: red, brown Foul Odor After Cleansing: No Slough/Fibrino No Wound Bed Granulation Amount: Large (67-100%) Exposed Structure Granulation Quality: Red Fascia Exposed: No Necrotic Amount: Small (1-33%) Fat Layer (Subcutaneous Tissue) Exposed: Yes Necrotic Quality: Adherent Slough Tendon Exposed: No Muscle  Exposed: No Joint Exposed: No Bone Exposed: No Periwound Skin Texture Texture Color No Abnormalities Noted: Yes No Abnormalities Noted: No Atrophie Blanche: No Moisture Cyanosis: No No Abnormalities Noted: Yes Ecchymosis: No Erythema: No Hemosiderin Staining: Yes Mottled: No Pallor: No Rubor: No Temperature / Pain Temperature: No Abnormality Tenderness on Palpation: Yes Treatment Notes Wound #6 (Lower Leg) Wound Laterality: Left, Anterior Cleanser Soap and Water Discharge Instruction: May shower and wash wound with dial antibacterial soap and water prior to dressing change. Wound Cleanser Discharge Instruction: Cleanse the wound with wound cleanser prior to applying a clean dressing using gauze sponges, not tissue or cotton balls. Peri-Wound Care Sween Lotion (Moisturizing lotion) Discharge Instruction: Apply moisturizing lotion as directed Topical Primary Dressing KerraCel Ag Gelling Fiber Dressing, 2x2 in (silver alginate) Discharge Instruction: Apply silver alginate to wound bed as instructed Secondary Dressing ABD Pad, 8x10 Discharge Instruction: Apply over primary dressing as directed. AMARISA, WILINSKI ([id number]) [id number] Page 8 of 10 Secured With Compression Wrap ThreePress (3 layer compression wrap) Discharge Instruction: Apply three layer compression as  directed. Compression Stockings Add-Ons Electronic Signature(s) Signed: 08/14/2022 4:08:33 PM By: Adline Peals Entered By: Adline Peals on 08/14/2022 07:58:47 -------------------------------------------------------------------------------- Wound Assessment Details Patient Name: Date of Service: [name] RO [name] 08/14/2022 7:45 A M Medical Record Number: [id number] Patient Account Number: [id number] Date of Birth/Sex: Treating RN: Sep 11, 1941 (81 y.o. [name] Primary Care Annastacia Duba: Carolann Littler Other Clinician: Referring Simya Tercero: Treating Raegen Tarpley/Extender: [name] in Treatment: 1 Wound Status Wound Number: 7 Primary Neuropathic Ulcer-Non Diabetic Etiology: Wound Location: Left, Plantar Foot Wound Status: Open Wounding Event: Gradually Appeared Comorbid Cataracts, Hypertension, Rheumatoid Arthritis, Seizure Date Acquired: 06/24/2022 History: Disorder Weeks Of Treatment: 1 Clustered Wound: No Photos Wound Measurements Length: (cm) 0.5 Width: (cm) 0.4 Depth: (cm) 0.4 Area: (cm) 0.157 Volume: (cm) 0.063 % Reduction in Area: -67% % Reduction in Volume: -125% Epithelialization: None Tunneling: No Undermining: Yes Starting Position (o'clock): 12 Ending Position (o'clock): 12 Maximum Distance: (cm) 0.3 Wound Description Classification: Full Thickness Without Exposed Support Structures Wound Margin: Thickened Exudate Amount: Medium Exudate Type: Purulent Exudate Color: yellow, brown, green Foul Odor After Cleansing: No Slough/Fibrino Yes Wound Bed Granulation Amount: Large (67-100%) Exposed WENDOLYN, RASO ([id number]) [id number] Page 9 of 10 Granulation Quality: Red Fascia Exposed: No Necrotic Amount: Small (1-33%) Fat Layer (Subcutaneous Tissue) Exposed: Yes Necrotic Quality: Adherent Slough Tendon Exposed: No Muscle Exposed: No Joint Exposed: No Bone  Exposed: No Periwound Skin Texture Texture Color No Abnormalities Noted: No No Abnormalities Noted: Yes Callus: Yes Temperature / Pain Temperature: No Abnormality Moisture No Abnormalities Noted: Yes Treatment Notes Wound #7 (Foot) Wound Laterality: Plantar, Left Cleanser Soap and Water Discharge Instruction: May shower and wash wound with dial antibacterial soap and water prior to dressing change. Wound Cleanser Discharge Instruction: Cleanse the wound with wound cleanser prior to applying a clean dressing using gauze sponges, not tissue or cotton balls. Peri-Wound Care Topical Gentamicin Discharge Instruction: As directed by physician Primary Dressing KerraCel Ag Gelling Fiber Dressing, 2x2 in (silver alginate) Discharge Instruction: Apply silver alginate to wound bed as instructed Secondary Dressing Optifoam Non-Adhesive Dressing, 4x4 in Discharge Instruction: Apply over primary dressing cut to make foam donut Woven Gauze Sponges 2x2 in Discharge Instruction: Apply over primary dressing as directed. Secured With 53M Medipore H Soft Cloth Surgical T ape, 4 x 10 (in/yd) Discharge Instruction: Secure with tape as directed. Compression Wrap Compression Stockings Add-Ons Electronic Signature(s) Signed: 08/14/2022 4:08:33  PM By: Sabas Sous By: Adline Peals on 08/14/2022 07:59:11 -------------------------------------------------------------------------------- Vitals Details Patient Name: Date of Service: [name], [place] RO [name] 08/14/2022 7:45 A M Medical Record Number: [id number] Patient Account Number: [id number] Date of Birth/Sex: Treating RN: Mar 21, 1941 (81 y.o. [name] Primary Care Iwao Shamblin: Carolann Littler Other Clinician: Referring Najia Hurlbutt: Treating Keyshaun Exley/Extender: [name] in Treatment: 1 Vital Signs Time Taken: 07:46 Temperature (F): 98.4 CHARLIE, CHAR ([id number])  [id number] Page 10 of 10 Height (in): 61 Pulse (bpm): 96 Weight (lbs): 128 Respiratory Rate (breaths/min): 18 Body Mass Index (BMI): 24.2 Blood Pressure (mmHg): 136/81 Reference Range: 80 - 120 mg / dl Electronic Signature(s) Signed: 08/14/2022 4:08:33 PM By: Adline Peals Entered By: Adline Peals on 08/14/2022 07:46:41

## 2022-08-20 ENCOUNTER — Telehealth: Payer: Self-pay | Admitting: Family Medicine

## 2022-08-20 DIAGNOSIS — R059 Cough, unspecified: Secondary | ICD-10-CM

## 2022-08-20 NOTE — Telephone Encounter (Addendum)
Pt called to let MD know she is still coughing, but it is a productive cough. Pt says the medication is helping, but she only has one day left. Also , she thinks she may need another chest xray.  Please advise.

## 2022-08-20 NOTE — Telephone Encounter (Signed)
Left a message for the pt to return my call.  

## 2022-08-20 NOTE — Telephone Encounter (Signed)
Patient returned call per message left

## 2022-08-21 ENCOUNTER — Encounter (HOSPITAL_BASED_OUTPATIENT_CLINIC_OR_DEPARTMENT_OTHER): Payer: Medicare Other | Admitting: General Surgery

## 2022-08-21 DIAGNOSIS — L97822 Non-pressure chronic ulcer of other part of left lower leg with fat layer exposed: Secondary | ICD-10-CM | POA: Diagnosis not present

## 2022-08-21 DIAGNOSIS — G6289 Other specified polyneuropathies: Secondary | ICD-10-CM | POA: Diagnosis not present

## 2022-08-21 DIAGNOSIS — G63 Polyneuropathy in diseases classified elsewhere: Secondary | ICD-10-CM | POA: Diagnosis not present

## 2022-08-21 DIAGNOSIS — L97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: Secondary | ICD-10-CM | POA: Diagnosis not present

## 2022-08-21 DIAGNOSIS — N182 Chronic kidney disease, stage 2 (mild): Secondary | ICD-10-CM | POA: Diagnosis not present

## 2022-08-21 DIAGNOSIS — I872 Venous insufficiency (chronic) (peripheral): Secondary | ICD-10-CM | POA: Diagnosis not present

## 2022-08-21 DIAGNOSIS — I129 Hypertensive chronic kidney disease with stage 1 through stage 4 chronic kidney disease, or unspecified chronic kidney disease: Secondary | ICD-10-CM | POA: Diagnosis not present

## 2022-08-21 DIAGNOSIS — L97522 Non-pressure chronic ulcer of other part of left foot with fat layer exposed: Secondary | ICD-10-CM | POA: Diagnosis not present

## 2022-08-21 DIAGNOSIS — M069 Rheumatoid arthritis, unspecified: Secondary | ICD-10-CM | POA: Diagnosis not present

## 2022-08-21 NOTE — Telephone Encounter (Addendum)
Pt called back again from missed call this morning. Is "stressed out" thinking she may not get her callback. In addition to returning the call, she is reqeusting a refill doxycycline (VIBRAMYCIN) 100 MG capsule after Dr Sarajane Jews advised her to call back to let him know how she was doing. Says Dr Sarajane Jews says he may want to do a chest xray because he heard a "rattle " in her chest

## 2022-08-21 NOTE — Telephone Encounter (Signed)
Patient calling back again, returning previous call

## 2022-08-21 NOTE — Progress Notes (Signed)
FRANCENE, MCERLEAN ([id number]) [id number] Page 1 of 11 Visit Report for 08/21/2022 Chief Complaint Document Details Patient Name: Date of Service: [name], [place] [name] [name] 08/21/2022 11:30 A M Medical Record Number: [id number] Patient Account Number: [id number] Date of Birth/Sex: Treating RN: 09-15-1941 (81 y.o. F) Primary Care Provider: Carolann Littler Other Clinician: Referring Provider: Treating Provider/Extender: [name] in Treatment: 2 Information Obtained from: Patient Chief Complaint Patient seen for complaints of Non-Healing Wound. Electronic Signature(s) Signed: 08/21/2022 12:09:48 PM By: Fredirick Maudlin MD FACS Entered By: Fredirick Maudlin on 08/21/2022 12:09:47 -------------------------------------------------------------------------------- Debridement Details Patient Name: Date of Service: [name], [place] [name] [name] 08/21/2022 11:30 A M Medical Record Number: [id number] Patient Account Number: [id number] Date of Birth/Sex: Treating RN: 1941-03-25 (81 y.o. [name] Primary Care Provider: Carolann Littler Other Clinician: Referring Provider: Treating Provider/Extender: [name] in Treatment: 2 Debridement Performed for Assessment: Wound #6 Left,Anterior Lower Leg Performed By: Physician Fredirick Maudlin, MD Debridement Type: Debridement Severity of Tissue Pre Debridement: Fat layer exposed Level of Consciousness (Pre-procedure): Awake and Alert Pre-procedure Verification/Time Out Yes - 12:00 Taken: Start Time: 12:00 Pain Control: Lidocaine 4% T opical Solution T Area Debrided (L x W): otal 3.3 (cm) x 4.8 (cm) = 15.84 (cm) Tissue and other material debrided: Non-Viable, Slough, Subcutaneous, Slough Level: Skin/Subcutaneous Tissue Debridement Description: Excisional Instrument: Curette Bleeding: Minimum Hemostasis Achieved: Pressure Procedural Pain: 0 Post Procedural  Pain: 0 Response to Treatment: Procedure was tolerated well Level of Consciousness (Post- Awake and Alert procedure): Post Debridement Measurements of Total Wound Length: (cm) 3.3 Width: (cm) 4.8 Depth: (cm) 0.1 Volume: (cm) 1.244 Character of Wound/Ulcer Post Debridement: Improved Severity of Tissue Post Debridement: Fat layer exposed [name] ([id number]) [id number] Page 2 of 11 Post Procedure Diagnosis Same as Pre-procedure Notes Scribed for Dr Celine Ahr by Sharyn Creamer, RN Electronic Signature(s) Signed: 08/21/2022 12:17:40 PM By: Fredirick Maudlin MD FACS Signed: 08/21/2022 5:04:15 PM By: Sharyn Creamer RN, BSN Entered By: Sharyn Creamer on 08/21/2022 12:03:09 -------------------------------------------------------------------------------- Debridement Details Patient Name: Date of Service: [name], [place] [name] [name] 08/21/2022 11:30 A M Medical Record Number: [id number] Patient Account Number: [id number] Date of Birth/Sex: Treating RN: April 12, 1941 (81 y.o. [name] Primary Care Provider: Carolann Littler Other Clinician: Referring Provider: Treating Provider/Extender: [name] in Treatment: 2 Debridement Performed for Assessment: Wound #7 Left,Plantar Foot Performed By: Physician Fredirick Maudlin, MD Debridement Type: Debridement Level of Consciousness (Pre-procedure): Awake and Alert Pre-procedure Verification/Time Out Yes - 12:00 Taken: Start Time: 12:00 Pain Control: Lidocaine 4% T opical Solution T Area Debrided (L x W): otal 0.5 (cm) x 0.5 (cm) = 0.25 (cm) Tissue and other material debrided: Non-Viable, Callus, Slough, Subcutaneous, Slough Level: Skin/Subcutaneous Tissue Debridement Description: Excisional Instrument: Curette Bleeding: Minimum Hemostasis Achieved: Pressure Procedural Pain: 0 Post Procedural Pain: 0 Response to Treatment: Procedure was tolerated well Level of Consciousness  (Post- Awake and Alert procedure): Post Debridement Measurements of Total Wound Length: (cm) 0.3 Width: (cm) 0.2 Depth: (cm) 0.3 Volume: (cm) 0.014 Character of Wound/Ulcer Post Debridement: Improved Post Procedure Diagnosis Same as Pre-procedure Notes Scribed for Dr Celine Ahr by Sharyn Creamer, RN Electronic Signature(s) Signed: 08/21/2022 12:17:40 PM By: Fredirick Maudlin MD FACS Signed: 08/21/2022 5:04:15 PM By: Sharyn Creamer RN, BSN Entered By: Sharyn Creamer on 08/21/2022 12:04:25 [name] ([id number]) [id number] Page 3 of 11 -------------------------------------------------------------------------------- HPI Details Patient Name: Date of Service: [name], [place] [name] [name] 08/21/2022  11:30 A M Medical Record Number: [id number] Patient Account Number: [id number] Date of Birth/Sex: Treating RN: Feb 06, 1941 (81 y.o. F) Primary Care Provider: Carolann Littler Other Clinician: Referring Provider: Treating Provider/Extender: [name] in Treatment: 2 History of Present Illness HPI Description: ADMISSION 11.14.2023 This is an 81 year old woman on chronic steroids and anticoagulants who fell near the beginning of October. She initially developed a hematoma on her left lower leg which subsequently ruptured. She presented to the emergency department on November 6 with concern for a wound infection due to developing erythema and warmth. The wound was debrided in the ED and clindamycin prescribed along with a recommendation for probiotics. Wet-to-dry dressings were prescribed, as well. She was seen again in the emergency room 3 days later for other symptoms. Photographs in the electronic medical record show that the wound is quite a bit cleaner. Wound care was consulted at that time. They recommended daily soap and water cleanse and Xeroform topped with an ABD and a Kerlix and Ace wrap. Upon her discharge from the hospital, she  was referred to the wound care center for further evaluation and management. ABI in clinic was 1.25. On intake, she was also noted to have an ulcer on her left midfoot associated with a fallen arch and callus. On her left anterior tibial surface, there is a large ovoid wound with some evidence of epithelialization. There is granulation tissue and slough present. No concern for infection. On the left midfoot, there is a large callus, in the center of which there is an ulceration exposing the fat layer. No malodor or purulent drainage. 08/14/2022: The left anterior tibial wound is smaller and cleaner. There is more epithelialization present. There is some slough accumulation on the surface. When her dressing was removed from the midfoot wound, pus drained out. The patient did say she had been having quite a bit more pain in her foot during the week. 08/21/2022: The culture that I took last week grew out very low levels of just some skin flora. The patient filled but did not take the antibiotic that I prescribed, as her primary care doctor put her on doxycycline for an upper respiratory infection. The anterior tibial wound is looking good. There is robust granulation tissue with a light layer of slough on the surface. The plantar midfoot wound has accumulated a thick callus once again. The opening in the skin is quite small. No purulent drainage present. Electronic Signature(s) Signed: 08/21/2022 12:11:34 PM By: Fredirick Maudlin MD FACS Entered By: Fredirick Maudlin on 08/21/2022 12:11:34 -------------------------------------------------------------------------------- Physical Exam Details Patient Name: Date of Service: [name], [place] [name] [name] 08/21/2022 11:30 A M Medical Record Number: [id number] Patient Account Number: [id number] Date of Birth/Sex: Treating RN: May 28, 1941 (81 y.o. F) Primary Care Provider: Carolann Littler Other Clinician: Referring Provider: Treating Provider/Extender: [name] in Treatment: 2 Constitutional . . . . No acute distress. Respiratory Normal work of breathing on room air. Notes 08/21/2022: The anterior tibial wound is looking good. There is robust granulation tissue with a light layer of slough on the surface. The plantar midfoot wound has accumulated a thick callus once again. The opening in the skin is quite small. No purulent drainage present. Electronic Signature(s) Signed: 08/21/2022 12:15:32 PM By: Fredirick Maudlin MD FACS Entered By: Fredirick Maudlin on 08/21/2022 12:15:32 [name] ([id number]) [id number] Page 4 of 11 -------------------------------------------------------------------------------- Physician Orders Details Patient Name: Date of Service: [name], [place] [name] [name] 08/21/2022 11:30  A M Medical Record Number: [id number] Patient Account Number: [id number] Date of Birth/Sex: Treating RN: 01/08/1941 (81 y.o. [name] Primary Care Provider: Carolann Littler Other Clinician: Referring Provider: Treating Provider/Extender: [name] in Treatment: 2 Verbal / Phone Orders: No Diagnosis Coding ICD-10 Coding Code Description [phone] Non-pressure chronic ulcer of other part of left lower leg with fat layer exposed L97.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 G63 Polyneuropathy in diseases classified elsewhere N18.2 Chronic kidney disease, stage 2 (mild) M06.9 Rheumatoid arthritis, unspecified I10 Essential (primary) hypertension Z79.52 Long term (current) use of systemic steroids Follow-up Appointments ppointment in 1 week. - Dr. Celine Ahr RM 1 Return A Anesthetic Wound #6 Left,Anterior Lower Leg (In clinic) Topical Lidocaine 4% applied to wound bed Bathing/ Shower/ Hygiene May shower with protection but do not get wound dressing(s) wet. - may be purchased at [facility] or [facility] Edema Control  - Lymphedema / SCD / Other Elevate legs to the level of the heart or above for 30 minutes daily and/or when sitting, a frequency of: - throughout the day while sitting Avoid standing for long periods of time. Patient to wear own compression stockings every day. - right leg Exercise regularly Wound Treatment Wound #6 - Lower Leg Wound Laterality: Left, Anterior Cleanser: Soap and Water 1 x Per Week/30 Days Discharge Instructions: May shower and wash wound with dial antibacterial soap and water prior to dressing change. Cleanser: Wound Cleanser 1 x Per Week/30 Days Discharge Instructions: Cleanse the wound with wound cleanser prior to applying a clean dressing using gauze sponges, not tissue or cotton balls. Peri-Wound Care: Sween Lotion (Moisturizing lotion) 1 x Per Week/30 Days Discharge Instructions: Apply moisturizing lotion as directed Prim Dressing: KerraCel Ag Gelling Fiber Dressing, 2x2 in (silver alginate) 1 x Per Week/30 Days ary Discharge Instructions: Apply silver alginate to wound bed as instructed Secondary Dressing: ABD Pad, 8x10 1 x Per Week/30 Days Discharge Instructions: Apply over primary dressing as directed. Compression Wrap: ThreePress (3 layer compression wrap) 1 x Per Week/30 Days Discharge Instructions: Apply three layer compression as directed. Wound #7 - Foot Wound Laterality: Plantar, Left Cleanser: Soap and Water 1 x Per Week/30 Days Discharge Instructions: May shower and wash wound with dial antibacterial soap and water prior to dressing change. Cleanser: Wound Cleanser 1 x Per Week/30 Days Discharge Instructions: Cleanse the wound with wound cleanser prior to applying a clean dressing using gauze sponges, not tissue or cotton balls. Prim Dressing: KerraCel Ag Gelling Fiber Dressing, 2x2 in (silver alginate) 1 x Per Week/30 Days ary Discharge Instructions: Apply silver alginate to wound bed as instructed [name], [name] ([id number])  [id number] Page 5 of 11 Secondary Dressing: Optifoam Non-Adhesive Dressing, 4x4 in 1 x Per Week/30 Days Discharge Instructions: Apply over primary dressing cut to make foam donut Secondary Dressing: Woven Gauze Sponges 2x2 in 1 x Per Week/30 Days Discharge Instructions: Apply over primary dressing as directed. Secured With: 77M Medipore H Soft Cloth Surgical T ape, 4 x 10 (in/yd) 1 x Per Week/30 Days Discharge Instructions: Secure with tape as directed. Patient Medications llergies: hydromorphone A Notifications Medication Indication Start End prior to debridement 08/21/2022 lidocaine DOSE topical 4 % cream - cream topical once daily Electronic Signature(s) Signed: 08/21/2022 12:17:40 PM By: Fredirick Maudlin MD FACS Entered By: Fredirick Maudlin on 08/21/2022 12:15:48 -------------------------------------------------------------------------------- Problem List Details Patient Name: Date of Service: [name], [place] [name] [name] 08/21/2022 11:30 A M Medical Record Number: [id number]  Patient Account Number: [id number] Date of Birth/Sex: Treating RN: 03/27/1941 (81 y.o. Elam Dutch Primary Care Provider: Carolann Littler Other Clinician: Referring Provider: Treating Provider/Extender: [name] in Treatment: 2 Active Problems ICD-10 Encounter Code Description Active Date MDM Diagnosis [phone] Non-pressure chronic ulcer of other part of left lower leg with fat layer exposed11/14/2023 No Yes L97.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 08/07/2022 No Yes G63 Polyneuropathy in diseases classified elsewhere 08/07/2022 No Yes N18.2 Chronic kidney disease, stage 2 (mild) 08/07/2022 No Yes M06.9 Rheumatoid arthritis, unspecified 08/07/2022 No Yes I10 Essential (primary) hypertension 08/07/2022 No Yes Z79.52 Long term (current) use of systemic steroids 08/07/2022 No Yes [name], [name]  ([id number]) [id number] Page 6 of 11 Inactive Problems Resolved Problems Electronic Signature(s) Signed: 08/21/2022 12:09:36 PM By: Fredirick Maudlin MD FACS Entered By: Fredirick Maudlin on 08/21/2022 12:09:36 -------------------------------------------------------------------------------- Progress Note Details Patient Name: Date of Service: [name], [place] [name] [name] 08/21/2022 11:30 A M Medical Record Number: [id number] Patient Account Number: [id number] Date of Birth/Sex: Treating RN: 1941-01-15 (81 y.o. F) Primary Care Provider: Carolann Littler Other Clinician: Referring Provider: Treating Provider/Extender: [name] in Treatment: 2 Subjective Chief Complaint Information obtained from Patient Patient seen for complaints of Non-Healing Wound. History of Present Illness (HPI) ADMISSION 11.14.2023 This is an 81 year old woman on chronic steroids and anticoagulants who fell near the beginning of October. She initially developed a hematoma on her left lower leg which subsequently ruptured. She presented to the emergency department on November 6 with concern for a wound infection due to developing erythema and warmth. The wound was debrided in the ED and clindamycin prescribed along with a recommendation for probiotics. Wet-to-dry dressings were prescribed, as well. She was seen again in the emergency room 3 days later for other symptoms. Photographs in the electronic medical record show that the wound is quite a bit cleaner. Wound care was consulted at that time. They recommended daily soap and water cleanse and Xeroform topped with an ABD and a Kerlix and Ace wrap. Upon her discharge from the hospital, she was referred to the wound care center for further evaluation and management. ABI in clinic was 1.25. On intake, she was also noted to have an ulcer on her left midfoot associated with a fallen arch and callus. On her left anterior  tibial surface, there is a large ovoid wound with some evidence of epithelialization. There is granulation tissue and slough present. No concern for infection. On the left midfoot, there is a large callus, in the center of which there is an ulceration exposing the fat layer. No malodor or purulent drainage. 08/14/2022: The left anterior tibial wound is smaller and cleaner. There is more epithelialization present. There is some slough accumulation on the surface. When her dressing was removed from the midfoot wound, pus drained out. The patient did say she had been having quite a bit more pain in her foot during the week. 08/21/2022: The culture that I took last week grew out very low levels of just some skin flora. The patient filled but did not take the antibiotic that I prescribed, as her primary care doctor put her on doxycycline for an upper respiratory infection. The anterior tibial wound is looking good. There is robust granulation tissue with a light layer of slough on the surface. The plantar midfoot wound has accumulated a thick callus once again. The opening in the skin is quite small. No purulent drainage  present. Patient History Information obtained from Patient, Chart. Family History Cancer - Mother,Siblings,Maternal Grandparents, Hypertension - Father, Seizures - Mother, Stroke - Mother, No family history of Diabetes, Heart Disease, Hereditary Spherocytosis, Kidney Disease, Lung Disease, Thyroid Problems, Tuberculosis. Social History Never smoker, Marital Status - Widowed, Alcohol Use - Rarely, Drug Use - No History, Caffeine Use - Daily - tea. Medical History Eyes Patient has history of Cataracts - bil extracted Denies history of Glaucoma, Optic Neuritis Cardiovascular Patient has history of Hypertension Genitourinary Denies history of End Stage Renal Disease Integumentary (Skin) Denies history of History of Burn Musculoskeletal Patient has history of Rheumatoid  Arthritis Neurologic Patient has history of Seizure Disorder - hx Oncologic Denies history of Received Chemotherapy, Received Radiation Psychiatric [name], [name] ([id number]) [id number] Page 7 of 11 Denies history of Anorexia/bulimia, Confinement Anxiety Medical A Surgical History Notes nd Respiratory hx pulmonary fibrosis Cardiovascular hx temporal arteritis, hyperlipidemia Gastrointestinal GERD, diverticulosis Genitourinary CRI stage 2 Musculoskeletal osteoporosis Neurologic TIA, CVA, chronic back pain Oncologic hx breast CA Objective Constitutional No acute distress. Vitals Time Taken: 11:42 AM, Height: 61 in, Weight: 128 lbs, BMI: 24.2, Temperature: 97.9 F, Pulse: 74 bpm, Respiratory Rate: 18 breaths/min, Blood Pressure: 126/74 mmHg. Respiratory Normal work of breathing on room air. General Notes: 08/21/2022: The anterior tibial wound is looking good. There is robust granulation tissue with a light layer of slough on the surface. The plantar midfoot wound has accumulated a thick callus once again. The opening in the skin is quite small. No purulent drainage present. Integumentary (Hair, Skin) Wound #6 status is Open. Original cause of wound was Trauma. The date acquired was: 07/08/2022. The wound has been in treatment 2 weeks. The wound is located on the Left,Anterior Lower Leg. The wound measures 3.3cm length x 4.8cm width x 0.1cm depth; 12.441cm^2 area and 1.244cm^3 volume. There is Fat Layer (Subcutaneous Tissue) exposed. There is no undermining noted. There is a medium amount of serosanguineous drainage noted. The wound margin is flat and intact. There is large (67-100%) red granulation within the wound bed. There is a small (1-33%) amount of necrotic tissue within the wound bed including Adherent Slough. The periwound skin appearance had no abnormalities noted for texture. The periwound skin appearance had no abnormalities noted  for moisture. The periwound skin appearance exhibited: Hemosiderin Staining. The periwound skin appearance did not exhibit: Atrophie Blanche, Cyanosis, Ecchymosis, Mottled, Pallor, Rubor, Erythema. Periwound temperature was noted as No Abnormality. The periwound has tenderness on palpation. Wound #7 status is Open. Original cause of wound was Gradually Appeared. The date acquired was: 06/24/2022. The wound has been in treatment 2 weeks. The wound is located on the [place]. The wound measures 0.3cm length x 0.2cm width x 0.3cm depth; 0.047cm^2 area and 0.014cm^3 volume. There is Fat Layer (Subcutaneous Tissue) exposed. There is no tunneling noted, however, there is undermining starting at 9:00 and ending at 12:00 with a maximum distance of 0.2cm. There is a small amount of serosanguineous drainage noted. The wound margin is thickened. There is large (67-100%) red, pale granulation within the wound bed. There is a small (1-33%) amount of necrotic tissue within the wound bed including Adherent Slough. The periwound skin appearance had no abnormalities noted for moisture. The periwound skin appearance had no abnormalities noted for color. The periwound skin appearance exhibited: Callus. Periwound temperature was noted as No Abnormality. The periwound has tenderness on palpation. Assessment Active Problems ICD-10 Non-pressure chronic ulcer of other part of left lower leg with fat layer  exposed Non-pressure chronic ulcer of left heel and midfoot with fat layer exposed Polyneuropathy in diseases classified elsewhere Chronic kidney disease, stage 2 (mild) Rheumatoid arthritis, unspecified Essential (primary) hypertension Long term (current) use of systemic steroids Procedures Wound #6 Pre-procedure diagnosis of Wound #6 is a Venous Leg Ulcer located on the Left,Anterior Lower Leg .Severity of Tissue Pre Debridement is: Fat layer exposed. There was a Excisional Skin/Subcutaneous Tissue  Debridement with a total area of 15.84 sq cm performed by Fredirick Maudlin, MD. With the following instrument(s): Curette to remove Non-Viable tissue/material. Material removed includes Subcutaneous Tissue and Slough and after achieving pain control using [name], [name] ([id number]) [id number] Page 8 of 11 Lidocaine 4% Topical Solution. No specimens were taken. A time out was conducted at 12:00, prior to the start of the procedure. A Minimum amount of bleeding was controlled with Pressure. The procedure was tolerated well with a pain level of 0 throughout and a pain level of 0 following the procedure. Post Debridement Measurements: 3.3cm length x 4.8cm width x 0.1cm depth; 1.244cm^3 volume. Character of Wound/Ulcer Post Debridement is improved. Severity of Tissue Post Debridement is: Fat layer exposed. Post procedure Diagnosis Wound #6: Same as Pre-Procedure General Notes: Scribed for Dr Celine Ahr by Sharyn Creamer, RN. Wound #7 Pre-procedure diagnosis of Wound #7 is a Neuropathic Ulcer-Non Diabetic located on the Left,Plantar Foot . There was a Excisional Skin/Subcutaneous Tissue Debridement with a total area of 0.25 sq cm performed by Fredirick Maudlin, MD. With the following instrument(s): Curette to remove Non-Viable tissue/material. Material removed includes Callus, Subcutaneous Tissue, and Slough after achieving pain control using Lidocaine 4% T opical Solution. No specimens were taken. A time out was conducted at 12:00, prior to the start of the procedure. A Minimum amount of bleeding was controlled with Pressure. The procedure was tolerated well with a pain level of 0 throughout and a pain level of 0 following the procedure. Post Debridement Measurements: 0.3cm length x 0.2cm width x 0.3cm depth; 0.014cm^3 volume. Character of Wound/Ulcer Post Debridement is improved. Post procedure Diagnosis Wound #7: Same as Pre-Procedure General Notes: Scribed for Dr Celine Ahr by  Sharyn Creamer, RN. Plan Follow-up Appointments: Return Appointment in 1 week. - Dr. Celine Ahr RM 1 Anesthetic: Wound #6 Left,Anterior Lower Leg: (In clinic) Topical Lidocaine 4% applied to wound bed Bathing/ Shower/ Hygiene: May shower with protection but do not get wound dressing(s) wet. - may be purchased at [facility] or [facility] Edema Control - Lymphedema / SCD / Other: Elevate legs to the level of the heart or above for 30 minutes daily and/or when sitting, a frequency of: - throughout the day while sitting Avoid standing for long periods of time. Patient to wear own compression stockings every day. - right leg Exercise regularly The following medication(s) was prescribed: lidocaine topical 4 % cream cream topical once daily for prior to debridement was prescribed at facility WOUND #6: - Lower Leg Wound Laterality: Left, Anterior Cleanser: Soap and Water 1 x Per Week/30 Days Discharge Instructions: May shower and wash wound with dial antibacterial soap and water prior to dressing change. Cleanser: Wound Cleanser 1 x Per Week/30 Days Discharge Instructions: Cleanse the wound with wound cleanser prior to applying a clean dressing using gauze sponges, not tissue or cotton balls. Peri-Wound Care: Sween Lotion (Moisturizing lotion) 1 x Per Week/30 Days Discharge Instructions: Apply moisturizing lotion as directed Prim Dressing: KerraCel Ag Gelling Fiber Dressing, 2x2 in (silver alginate) 1 x Per Week/30 Days ary Discharge Instructions: Apply silver alginate to  wound bed as instructed Secondary Dressing: ABD Pad, 8x10 1 x Per Week/30 Days Discharge Instructions: Apply over primary dressing as directed. Com pression Wrap: ThreePress (3 layer compression wrap) 1 x Per Week/30 Days Discharge Instructions: Apply three layer compression as directed. WOUND #7: - Foot Wound Laterality: Plantar, Left Cleanser: Soap and Water 1 x Per Week/30 Days Discharge Instructions: May shower and wash wound with  dial antibacterial soap and water prior to dressing change. Cleanser: Wound Cleanser 1 x Per Week/30 Days Discharge Instructions: Cleanse the wound with wound cleanser prior to applying a clean dressing using gauze sponges, not tissue or cotton balls. Prim Dressing: KerraCel Ag Gelling Fiber Dressing, 2x2 in (silver alginate) 1 x Per Week/30 Days ary Discharge Instructions: Apply silver alginate to wound bed as instructed Secondary Dressing: Optifoam Non-Adhesive Dressing, 4x4 in 1 x Per Week/30 Days Discharge Instructions: Apply over primary dressing cut to make foam donut Secondary Dressing: Woven Gauze Sponges 2x2 in 1 x Per Week/30 Days Discharge Instructions: Apply over primary dressing as directed. Secured With: 91M Medipore H Soft Cloth Surgical T ape, 4 x 10 (in/yd) 1 x Per Week/30 Days Discharge Instructions: Secure with tape as directed. 08/21/2022: The anterior tibial wound is looking good. There is robust granulation tissue with a light layer of slough on the surface. The plantar midfoot wound has accumulated a thick callus once again. The opening in the skin is quite small. No purulent drainage present. I used a curette to debride callus, slough, and nonviable subcutaneous tissue from the foot wound. I debrided slough and nonviable subcutaneous tissue from the anterior leg wound with the same curette. We will continue silver alginate to both sites. I told the patient that I did not think she needed to take the Augmentin I had prescribed, as I think the doxycycline her PCP gave her for her upper respiratory issues probably covered the skin flora from her foot. Continue 3 layer compression. Follow-up in 1 week. Electronic Signature(s) Signed: 08/21/2022 12:17:06 PM By: Fredirick Maudlin MD FACS Entered By: Fredirick Maudlin on 08/21/2022 12:17:06 [name] ([id number]) [id number] Page 9 of  11 -------------------------------------------------------------------------------- HxROS Details Patient Name: Date of Service: [name], [place] [name] [name] 08/21/2022 11:30 A M Medical Record Number: [id number] Patient Account Number: [id number] Date of Birth/Sex: Treating RN: 02/18/1941 (82 y.o. F) Primary Care Provider: Carolann Littler Other Clinician: Referring Provider: Treating Provider/Extender: [name] in Treatment: 2 Information Obtained From Patient Chart Eyes Medical History: Positive for: Cataracts - bil extracted Negative for: Glaucoma; Optic Neuritis Respiratory Medical History: Past Medical History Notes: hx pulmonary fibrosis Cardiovascular Medical History: Positive for: Hypertension Past Medical History Notes: hx temporal arteritis, hyperlipidemia Gastrointestinal Medical History: Past Medical History Notes: GERD, diverticulosis Genitourinary Medical History: Negative for: End Stage Renal Disease Past Medical History Notes: CRI stage 2 Integumentary (Skin) Medical History: Negative for: History of Burn Musculoskeletal Medical History: Positive for: Rheumatoid Arthritis Past Medical History Notes: osteoporosis Neurologic Medical History: Positive for: Seizure Disorder - hx Past Medical History Notes: TIA, CVA, chronic back pain Oncologic Medical History: Negative for: Received Chemotherapy; Received Radiation Past Medical History Notes: hx breast CA Psychiatric Medical History: Negative for: [name] [name], [name] ([id number]) [id number] Page 10 of 11 [name] Eyes: Cataracts Immunizations Pneumococcal Vaccine: Received Pneumococcal Vaccination: Yes Received Pneumococcal Vaccination On or After 60th Birthday: Yes Implantable Devices No devices added Family and Social History Cancer: Yes - Mother,Siblings,Maternal Grandparents;  Diabetes: No; Heart  Disease: No; Hereditary Spherocytosis: No; Hypertension: Yes - Father; Kidney Disease: No; Lung Disease: No; Seizures: Yes - Mother; Stroke: Yes - Mother; Thyroid Problems: No; Tuberculosis: No; Never smoker; Marital Status - Widowed; Alcohol Use: Rarely; Drug Use: No History; Caffeine Use: Daily - tea; Financial Concerns: No; Food, Clothing or Shelter Needs: No; Support System Lacking: No Electronic Signature(s) Signed: 08/21/2022 12:17:40 PM By: Fredirick Maudlin MD FACS Entered By: Fredirick Maudlin on 08/21/2022 12:11:40 -------------------------------------------------------------------------------- SuperBill Details Patient Name: Date of Service: [name], [place] [name] [name] 08/21/2022 Medical Record Number: [id number] Patient Account Number: [id number] Date of Birth/Sex: Treating RN: Feb 24, 1941 (81 y.o. 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 G63 Polyneuropathy in diseases classified elsewhere N18.2 Chronic kidney disease, stage 2 (mild) M06.9 Rheumatoid arthritis, unspecified I10 Essential (primary) hypertension Z79.52 Long term (current) use of systemic steroids Facility Procedures : CPT4 Code: [id number] Description: 11042 - DEB SUBQ TISSUE 20 SQ CM/< ICD-10 Diagnosis Description L97.822 Non-pressure chronic ulcer of other part of left lower leg with fat layer expo L97.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 Modifier: sed Quantity: 1 Physician Procedures : CPT4 Code Description Modifier [id number] - WC PHYS LEVEL 4 - EST PT 25 ICD-10 Diagnosis Description L97.822 Non-pressure chronic  ulcer of other part of left lower leg with fat layer exposed L97.422 Non-pressure chronic ulcer of left heel and  midfoot with fat layer exposed N18.2 Chronic kidney disease, stage 2 (mild) Z79.52 Long term (current) use of systemic steroids [name], [name] ([id number]) [id number] 11042 - WC PHYS SUBQ TISS 20 SQ CM ICD-10 Diagnosis  Description L97.822 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

## 2022-08-21 NOTE — Telephone Encounter (Signed)
Left message for pt to return my call.

## 2022-08-21 NOTE — Telephone Encounter (Signed)
Left a message for the pt to return my call.  

## 2022-08-21 NOTE — Progress Notes (Signed)
STEFANIA, GOULART ([id number]) [phone].pdf Page 1 of 10 Visit Report for [name]/28/2023 Arrival Information Details Patient Name: Date of Service: [name], [name] [name]/28/2023 [name]:30 A M Medical Record Number: [id number] Patient Account Number: [id number] Date of Birth/Sex: Treating RN: [birth year] (81 y.o. [name] Primary Care Saquoia Sianez: Carolann Littler Other Clinician: Referring Zivah Mayr: Treating Codee Bloodworth/Extender: [name] in Treatment: 2 Visit Information History Since Last Visit Added or deleted any medications: Yes Patient Arrived: Walker Any new allergies or adverse reactions: No Arrival Time: [name]:41 Had a fall or experienced change in No Accompanied By: friends activities of daily living that may affect Transfer Assistance: None risk of falls: Patient Requires Transmission-Based Precautions: No Signs or symptoms of abuse/neglect since last visito No Patient Has Alerts: No Hospitalized since last visit: No Implantable device outside of the clinic excluding No cellular tissue based products placed in the center since last visit: Has Dressing in Place as Prescribed: Yes Has Compression in Place as Prescribed: Yes Pain Present Now: Yes Electronic Signature(s) Signed: 08/21/2022 5:04:43 PM By: Baruch Gouty RN, BSN Entered By: Baruch Gouty on [name]/28/2023 [name]:42:29 -------------------------------------------------------------------------------- Compression Therapy Details Patient Name: Date of Service: [name], [place] RO [name] [name]/28/2023 [name]:30 A M Medical Record Number: [id number] Patient Account Number: [id number] Date of Birth/Sex: Treating RN: [birth year] (81 y.o. [name] Primary Care Chantel Teti: Carolann Littler Other Clinician: Referring Cordera Stineman: Treating Abia Monaco/Extender: [name] in Treatment: 2 Compression Therapy Performed for Wound Assessment: Wound #6  Left,Anterior Lower Leg Performed By: Clinician Sharyn Creamer, RN Compression Type: Three Layer Post Procedure Diagnosis Same as Pre-procedure Electronic Signature(s) Signed: 08/21/2022 5:04:15 PM By: Sharyn Creamer RN, BSN Entered By: Sharyn Creamer on [name]/28/2023 16:47:55 [name] ([id number]) [id number] Page 2 of 10 -------------------------------------------------------------------------------- Compression Therapy Details Patient Name: Date of Service: [name], [name] [name]/28/2023 [name]:30 A M Medical Record Number: [id number] Patient Account Number: [id number] Date of Birth/Sex: Treating RN: [birth year][name] (81 y.o. [name] Primary Care Javohn Basey: Carolann Littler Other Clinician: Referring Shayn Madole: Treating Prestin Munch/Extender: [name] in Treatment: 2 Compression Therapy Performed for Wound Assessment: Wound #7 Left,Plantar Foot Performed By: Clinician Sharyn Creamer, RN Compression Type: Three Layer Post Procedure Diagnosis Same as Pre-procedure Electronic Signature(s) Signed: 08/21/2022 5:04:15 PM By: Sharyn Creamer RN, BSN Entered By: Sharyn Creamer on [name]/28/2023 16:47:55 -------------------------------------------------------------------------------- Encounter Discharge Information Details Patient Name: Date of Service: [name], [place] [name] [name]/28/2023 [name]:30 A M Medical Record Number: [id number] Patient Account Number: [id number] Date of Birth/Sex: Treating RN: [birth year][name] (81 y.o. [name] Primary Care Hisae Decoursey: Carolann Littler Other Clinician: Referring Gergory Biello: Treating Deerica Waszak/Extender: [name] in Treatment: 2 Encounter Discharge Information Items Post Procedure Vitals Discharge Condition: Stable Temperature (F): 97.9 Ambulatory Status: Walker Pulse (bpm): 74 Discharge Destination: Home Respiratory Rate (breaths/min): 18 Transportation: Private  Auto Blood Pressure (mmHg): 126/74 Accompanied By: friend Schedule Follow-up Appointment: Yes Clinical Summary of Care: Patient Declined Electronic Signature(s) Signed: 08/21/2022 5:04:15 PM By: Sharyn Creamer RN, BSN Entered By: Sharyn Creamer on [name]/28/2023 12:19:41 -------------------------------------------------------------------------------- Lower Extremity Assessment Details Patient Name: Date of Service: [name][place] RO [name] [name]/28/2023 [name]:30 A M Medical Record Number: [id number] Patient Account Number: [id number] Date of Birth/Sex: Treating RN: [name][birth year] (81 y.o. [name] Primary Care Nimesh Riolo: Carolann Littler Other Clinician: Referring Oziel Beitler: Treating Vaanya Shambaugh/Extender: [name] in Treatment: 2 Edema Assessment Assessed: [Left: No] [  Right: No] Edema: [Left: Ye] [Right: s] Calf [name], [name] ([id number]) [id number] Page 3 of 10 Left: Right: Point of Measurement: From Medial Instep 32.5 cm Ankle Left: Right: Point of Measurement: From Medial Instep 1.5 cm Vascular Assessment Pulses: Dorsalis Pedis Palpable: [Left:Yes] Electronic Signature(s) Signed: 08/21/2022 5:04:43 PM By: Baruch Gouty RN, BSN Entered By: Baruch Gouty on [name]/28/2023 [name]:50:06 -------------------------------------------------------------------------------- Multi Wound Chart Details Patient Name: Date of Service: [name][place] RO [name] [name]/28/2023 [name]:30 A M Medical Record Number: [id number] Patient Account Number: [id number] Date of Birth/Sex: Treating RN: [name][birth year] (81 y.o. F) Primary Care [name]: Carolann Littler Other Clinician: Referring Chayanne Filippi: Treating Tammera Engert/Extender: [name] in Treatment: 2 Vital Signs Height(in): 61 Pulse(bpm): 74 Weight(lbs): 128 Blood Pressure(mmHg): 126/74 Body Mass Index(BMI): 24.2 Temperature(F): 97.9 Respiratory Rate(breaths/min):  18 [6:Photos:] [N/A:N/A] Left, Anterior Lower Leg Left, Plantar Foot N/A Wound Location: Trauma Gradually Appeared N/A Wounding Event: Venous Leg Ulcer Neuropathic Ulcer-Non Diabetic N/A Primary Etiology: Cataracts, Hypertension, Rheumatoid Cataracts, Hypertension, Rheumatoid N/A Comorbid History: Arthritis, Seizure Disorder Arthritis, Seizure Disorder 07/08/2022 06/24/2022 N/A Date Acquired: 2 2 N/A Weeks of Treatment: Open Open N/A Wound Status: No No N/A Wound Recurrence: 3.3x4.8x0.1 0.3x0.2x0.3 N/A Measurements L x W x D (cm) 12.441 0.047 N/A A (cm) : rea 1.244 0.014 N/A Volume (cm) : 61.90% 50.00% N/A % Reduction in A rea: 61.90% 50.00% N/A % Reduction in Volume: 9 Starting Position 1 (o'clock): 12 Ending Position 1 (o'clock): 0.2 Maximum Distance 1 (cm): No Yes N/A Undermining: Full Thickness Without Exposed Full Thickness Without Exposed N/A Classification: Support Structures Support Structures Medium Small N/A Exudate Amount: Serosanguineous Serosanguineous N/A Exudate Type: red, brown red, brown N/A Exudate ColorDAMONIE, [name] ([id number]) [id number] Page 4 of 10 Flat and Intact Thickened N/A Wound Margin: Large (67-100%) Large (67-100%) N/A Granulation Amount: Red Red, Pale N/A Granulation Quality: Small (1-33%) Small (1-33%) N/A Necrotic Amount: Fat Layer (Subcutaneous Tissue): Yes Fat Layer (Subcutaneous Tissue): Yes N/A Exposed Structures: Fascia: No Fascia: No Tendon: No Tendon: No Muscle: No Muscle: No Joint: No Joint: No Bone: No Bone: No Small (1-33%) None N/A Epithelialization: Debridement - Excisional Debridement - Excisional N/A Debridement: Pre-procedure Verification/Time Out 12:00 12:00 N/A Taken: Lidocaine 4% Topical Solution Lidocaine 4% Topical Solution N/A Pain Control: Subcutaneous, Slough Callus, Subcutaneous, Slough N/A Tissue Debrided: Skin/Subcutaneous Tissue Skin/Subcutaneous  Tissue N/A Level: 15.84 0.25 N/A Debridement A (sq cm): rea Curette Curette N/A Instrument: Minimum Minimum N/A Bleeding: Pressure Pressure N/A Hemostasis A chieved: 0 0 N/A Procedural Pain: 0 0 N/A Post Procedural Pain: Procedure was tolerated well Procedure was tolerated well N/A Debridement Treatment Response: 3.3x4.8x0.1 0.3x0.2x0.3 N/A Post Debridement Measurements L x W x D (cm) 1.244 0.014 N/A Post Debridement Volume: (cm) Excoriation: No Callus: Yes N/A Periwound Skin Texture: Induration: No Callus: No Crepitus: No Rash: No Scarring: No Maceration: No No Abnormalities Noted N/A Periwound Skin Moisture: Dry/Scaly: No Hemosiderin Staining: Yes No Abnormalities Noted N/A Periwound Skin Color: Atrophie Blanche: No Cyanosis: No Ecchymosis: No Erythema: No Mottled: No Pallor: No Rubor: No No Abnormality No Abnormality N/A Temperature: Yes Yes N/A Tenderness on Palpation: Debridement Debridement N/A Procedures Performed: Treatment Notes Electronic Signature(s) Signed: 08/21/2022 12:09:42 PM By: [name] Maudlin MD [name] Entered By: [name] on [name]/28/2023 12:09:42 -------------------------------------------------------------------------------- Multi-Disciplinary Care Plan Details Patient Name: Date of Service: [name], [place] [name] [name]/28/2023 [name]:30 A M Medical Record Number: [id number] Patient Account Number: [id number] Date of Birth/Sex: Treating RN: [name], [name] (80 y.o. F)  Baruch Gouty Primary Care Janelle Culton: Carolann Littler Other Clinician: Referring Terez Montee: Treating Keysi Oelkers/Extender: [name] in Treatment: 2 Multidisciplinary Care Plan reviewed with physician Active Inactive Abuse / Safety / Falls / Self Care Management Nursing Diagnoses: History of Falls Potential for falls [name], [name] ([id number]) [phone].pdf Page 5 of 10 Goals: Patient/caregiver will  verbalize/demonstrate measures taken to prevent injury and/or falls Date Initiated: [name]/14/2023 Target Resolution Date: 09/04/2022 Goal Status: Active Interventions: Assess fall risk on admission and as needed Assess impairment of mobility on admission and as needed per policy Notes: Venous Leg Ulcer Nursing Diagnoses: Knowledge deficit related to disease process and management Potential for venous Insuffiency (use before diagnosis confirmed) Goals: Patient will maintain optimal edema control Date Initiated: [name]/14/2023 Target Resolution Date: 09/05/2022 Goal Status: Active Interventions: Assess peripheral edema status every visit. Compression as ordered Provide education on venous insufficiency Treatment Activities: Therapeutic compression applied : [name]/14/2023 Notes: Wound/Skin Impairment Nursing Diagnoses: Impaired tissue integrity Knowledge deficit related to ulceration/compromised skin integrity Goals: Patient/caregiver will verbalize understanding of skin care regimen Date Initiated: [name]/14/2023 Target Resolution Date: 09/04/2022 Goal Status: Active Ulcer/skin breakdown will have a volume reduction of 30% by week 4 Date Initiated: [name]/14/2023 Target Resolution Date: 09/04/2022 Goal Status: Active Interventions: Assess patient/caregiver ability to obtain necessary supplies Assess patient/caregiver ability to perform ulcer/skin care regimen upon admission and as needed Assess ulceration(s) every visit Provide education on ulcer and skin care Treatment Activities: Skin care regimen initiated : [name]/14/2023 Topical wound management initiated : [name]/14/2023 Notes: Electronic Signature(s) Signed: 08/21/2022 5:04:43 PM By: Baruch Gouty RN, BSN Entered By: Baruch Gouty on [name]/28/2023 [name]:53:22 -------------------------------------------------------------------------------- Pain Assessment Details Patient Name: Date of Service: [name][place] RO [name] [name]/28/2023 [name]:30 A  M Medical Record Number: [id number] Patient Account Number: [id number] Date of Birth/Sex: Treating RN: [birth year] (81 y.o. [name] Primary Care Jermane Brayboy: Carolann Littler Other Clinician: Earnest [name] ([id number]) [id number] Page 6 of 10 Referring Quanisha Drewry: Treating Iyanni Hepp/Extender: [name] in Treatment: 2 Active Problems Location of Pain Severity and Description of Pain Patient Has Paino Yes Site Locations Pain Location: Pain in Ulcers With Dressing Change: Yes Duration of the Pain. Constant / Intermittento Intermittent Rate the pain. Current Pain Level: 0 Worst Pain Level: 8 Least Pain Level: 0 Character of Pain Describe the Pain: Tender Pain Management and Medication Current Pain Management: Notes reports pain in left foot with walking Electronic Signature(s) Signed: 08/21/2022 5:04:43 PM By: Baruch Gouty RN, BSN Entered By: Baruch Gouty on [name]/28/2023 [name]:44:35 -------------------------------------------------------------------------------- Patient/Caregiver Education Details Patient Name: Date of Service: [name] RO [name] [name]/28/2023andnbsp11:30 A M Medical Record Number: [id number] Patient Account Number: [id number] Date of Birth/Gender: Treating RN: [birth year][name] (81 y.o. [name] Primary Care Physician: Carolann Littler Other Clinician: Referring Physician: Treating Physician/Extender: [name] in Treatment: 2 Education Assessment Education Provided To: Patient Education Topics Provided Offloading: Methods: Explain/Verbal Responses: Reinforcements needed, State content correctly Venous: Methods: Explain/Verbal Responses: Reinforcements needed, State content correctly [name], [name] ([id number]) [id number] Page 7 of 10 Electronic Signature(s) Signed: 08/21/2022 5:04:43 PM By: Baruch Gouty RN, BSN Entered  By: Baruch Gouty on [name]/28/2023 [name]:53:42 -------------------------------------------------------------------------------- Wound Assessment Details Patient Name: Date of Service: [name][place] RO [name] [name]/28/2023 [name]:30 A M Medical Record Number: [id number] Patient Account Number: [id number] Date of Birth/Sex: Treating RN: [birth year][name][birth year] (81 y.o. [name] Primary Care Sondos Wolfman: Carolann Littler Other Clinician: Referring Sonda Coppens: Treating Brahm Barbeau/Extender: Tammi Sou,  Bruce Weeks in Treatment: 2 Wound Status Wound Number: 6 Primary Venous Leg Ulcer Etiology: Wound Location: Left, Anterior Lower Leg Wound Status: Open Wounding Event: Trauma Comorbid Cataracts, Hypertension, Rheumatoid Arthritis, Seizure Date Acquired: 07/08/2022 History: Disorder Weeks Of Treatment: 2 Clustered Wound: No Photos Wound Measurements Length: (cm) 3.3 Width: (cm) 4.8 Depth: (cm) 0.1 Area: (cm) 12.441 Volume: (cm) 1.244 % Reduction in Area: 61.9% % Reduction in Volume: 61.9% Epithelialization: Small (1-33%) Undermining: No Wound Description Classification: Full Thickness Without Exposed Support Structures Wound Margin: Flat and Intact Exudate Amount: Medium Exudate Type: Serosanguineous Exudate Color: red, brown Foul Odor After Cleansing: No Slough/Fibrino No Wound Bed Granulation Amount: Large (67-100%) Exposed Structure Granulation Quality: Red Fascia Exposed: No Necrotic Amount: Small (1-33%) Fat Layer (Subcutaneous Tissue) Exposed: Yes Necrotic Quality: Adherent Slough Tendon Exposed: No Muscle Exposed: No Joint Exposed: No Bone Exposed: No Periwound Skin Texture Texture Color No Abnormalities Noted: Yes No Abnormalities Noted: No Atrophie Blanche: No Moisture Cyanosis: No No Abnormalities Noted: Yes Ecchymosis: No Erythema: No Hemosiderin Staining: Yes [name], [name] ([id number]) [id number] Page 8 of 10 Mottled:  No Pallor: No Rubor: No Temperature / Pain Temperature: No Abnormality Tenderness on Palpation: Yes Treatment Notes Wound #6 (Lower Leg) Wound Laterality: Left, Anterior Cleanser Soap and Water Discharge Instruction: May shower and wash wound with dial antibacterial soap and water prior to dressing change. Wound Cleanser Discharge Instruction: Cleanse the wound with wound cleanser prior to applying a clean dressing using gauze sponges, not tissue or cotton balls. Peri-Wound Care Sween Lotion (Moisturizing lotion) Discharge Instruction: Apply moisturizing lotion as directed Topical Primary Dressing KerraCel Ag Gelling Fiber Dressing, 2x2 in (silver alginate) Discharge Instruction: Apply silver alginate to wound bed as instructed Secondary Dressing ABD Pad, 8x10 Discharge Instruction: Apply over primary dressing as directed. Secured With Compression Wrap ThreePress (3 layer compression wrap) Discharge Instruction: Apply three layer compression as directed. Compression Stockings Add-Ons Electronic Signature(s) Signed: 08/21/2022 5:04:43 PM By: Baruch Gouty RN, BSN Entered By: Baruch Gouty on [name]/28/2023 [name]:51:50 -------------------------------------------------------------------------------- Wound Assessment Details Patient Name: Date of Service: [name], [place] RO [name] [name]/28/2023 [name]:30 A M Medical Record Number: [id number] Patient Account Number: [id number] Date of Birth/Sex: Treating RN: [name][birth year] (81 y.o. [name] Primary Care Larissa Pegg: Carolann Littler Other Clinician: Referring Sheba Whaling: Treating Dyer Klug/Extender: [name] in Treatment: 2 Wound Status Wound Number: 7 Primary Neuropathic Ulcer-Non Diabetic Etiology: Wound Location: Left, Plantar Foot Wound Status: Open Wounding Event: Gradually Appeared Comorbid Cataracts, Hypertension, Rheumatoid Arthritis, Seizure Date Acquired: 06/24/2022 History: Disorder Weeks Of  Treatment: 2 Clustered Wound: No Photos [name], [name] ([id number]) [id number] Page 9 of 10 Wound Measurements Length: (cm) 0.3 Width: (cm) 0.2 Depth: (cm) 0.3 Area: (cm) 0.047 Volume: (cm) 0.014 % Reduction in Area: 50% % Reduction in Volume: 50% Epithelialization: None Tunneling: No Undermining: Yes Starting Position (o'clock): 9 Ending Position (o'clock): 12 Maximum Distance: (cm) 0.2 Wound Description Classification: Full Thickness Without Exposed Support Structures Wound Margin: Thickened Exudate Amount: Small Exudate Type: Serosanguineous Exudate Color: red, brown Foul Odor After Cleansing: No Slough/Fibrino Yes Wound Bed Granulation Amount: Large (67-100%) Exposed Structure Granulation Quality: Red, Pale Fascia Exposed: No Necrotic Amount: Small (1-33%) Fat Layer (Subcutaneous Tissue) Exposed: Yes Necrotic Quality: Adherent Slough Tendon Exposed: No Muscle Exposed: No Joint Exposed: No Bone Exposed: No Periwound Skin Texture Texture Color No Abnormalities Noted: No No Abnormalities Noted: Yes Callus: Yes Temperature / Pain Temperature: No Abnormality Moisture No Abnormalities Noted: Yes Tenderness on Palpation: Yes  Treatment Notes Wound #7 (Foot) Wound Laterality: Plantar, Left Cleanser Soap and Water Discharge Instruction: May shower and wash wound with dial antibacterial soap and water prior to dressing change. Wound Cleanser Discharge Instruction: Cleanse the wound with wound cleanser prior to applying a clean dressing using gauze sponges, not tissue or cotton balls. Peri-Wound Care Topical Primary Dressing KerraCel Ag Gelling Fiber Dressing, 2x2 in (silver alginate) Discharge Instruction: Apply silver alginate to wound bed as instructed Secondary Dressing Optifoam Non-Adhesive Dressing, 4x4 in Discharge Instruction: Apply over primary dressing cut to make foam donut Woven Gauze Sponges 2x2 in Discharge  Instruction: Apply over primary dressing as directed. Secured With 31M Medipore H Soft Cloth Surgical T ape, 4 x 10 (in/yd) Discharge Instruction: Secure with tape as directed. [name], [name] ([id number]) [phone].pdf Page 10 of 10 Compression Wrap Compression Stockings Add-Ons Electronic Signature(s) Signed: 08/21/2022 5:04:43 PM By: Baruch Gouty RN, BSN Entered By: Baruch Gouty on [name]/28/2023 [name]:52:50 -------------------------------------------------------------------------------- Vitals Details Patient Name: Date of Service: [name][place] RO [name] [name]/28/2023 [name]:30 A M Medical Record Number: [id number] Patient Account Number: [id number] Date of Birth/Sex: Treating RN: [birth year], [name] (81 y.o. [name] Primary Care Riona Lahti: Carolann Littler Other Clinician: Referring Arah Aro: Treating Gwendloyn Forsee/Extender: [name] in Treatment: 2 Vital Signs Time Taken: [name]:42 Temperature (F): 97.9 Height (in): 61 Pulse (bpm): 74 Weight (lbs): 128 Respiratory Rate (breaths/min): 18 Body Mass Index (BMI): 24.2 Blood Pressure (mmHg): 126/74 Reference Range: 80 - 120 mg / dl Electronic Signature(s) Signed: 08/21/2022 5:04:43 PM By: Baruch Gouty RN, BSN Entered By: Baruch Gouty on [name]/28/2023 [name]:42:51

## 2022-08-22 ENCOUNTER — Encounter (HOSPITAL_BASED_OUTPATIENT_CLINIC_OR_DEPARTMENT_OTHER): Payer: Medicare Other | Admitting: General Surgery

## 2022-08-22 NOTE — Telephone Encounter (Signed)
Left a message for the pt to return my call.  

## 2022-08-22 NOTE — Telephone Encounter (Signed)
Chest x-ray placed and patient scheduled.

## 2022-08-22 NOTE — Telephone Encounter (Signed)
Pt is returning mykal call and she will be home all afternoon

## 2022-08-22 NOTE — Telephone Encounter (Signed)
Patient informed of the message below concerning Antibiotics and repeat chest x-ray. Patient reports she is still having ongoing cough and productive cough with yellow sputum.

## 2022-08-23 ENCOUNTER — Other Ambulatory Visit: Payer: Medicare Other

## 2022-08-23 ENCOUNTER — Ambulatory Visit (INDEPENDENT_AMBULATORY_CARE_PROVIDER_SITE_OTHER): Payer: Medicare Other

## 2022-08-23 DIAGNOSIS — L97822 Non-pressure chronic ulcer of other part of left lower leg with fat layer exposed: Secondary | ICD-10-CM | POA: Diagnosis not present

## 2022-08-23 DIAGNOSIS — M315 Giant cell arteritis with polymyalgia rheumatica: Secondary | ICD-10-CM | POA: Diagnosis not present

## 2022-08-23 DIAGNOSIS — L97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: Secondary | ICD-10-CM | POA: Diagnosis not present

## 2022-08-23 DIAGNOSIS — M069 Rheumatoid arthritis, unspecified: Secondary | ICD-10-CM | POA: Diagnosis not present

## 2022-08-23 DIAGNOSIS — R059 Cough, unspecified: Secondary | ICD-10-CM

## 2022-08-23 DIAGNOSIS — J84112 Idiopathic pulmonary fibrosis: Secondary | ICD-10-CM | POA: Diagnosis not present

## 2022-08-23 DIAGNOSIS — E1142 Type 2 diabetes mellitus with diabetic polyneuropathy: Secondary | ICD-10-CM | POA: Diagnosis not present

## 2022-08-27 DIAGNOSIS — J84112 Idiopathic pulmonary fibrosis: Secondary | ICD-10-CM | POA: Diagnosis not present

## 2022-08-27 DIAGNOSIS — M069 Rheumatoid arthritis, unspecified: Secondary | ICD-10-CM | POA: Diagnosis not present

## 2022-08-27 DIAGNOSIS — L97822 Non-pressure chronic ulcer of other part of left lower leg with fat layer exposed: Secondary | ICD-10-CM | POA: Diagnosis not present

## 2022-08-27 DIAGNOSIS — E1142 Type 2 diabetes mellitus with diabetic polyneuropathy: Secondary | ICD-10-CM | POA: Diagnosis not present

## 2022-08-27 DIAGNOSIS — M315 Giant cell arteritis with polymyalgia rheumatica: Secondary | ICD-10-CM | POA: Diagnosis not present

## 2022-08-27 DIAGNOSIS — L97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: Secondary | ICD-10-CM | POA: Diagnosis not present

## 2022-08-28 ENCOUNTER — Ambulatory Visit: Payer: Self-pay | Admitting: Licensed Clinical Social Worker

## 2022-08-29 ENCOUNTER — Encounter (HOSPITAL_BASED_OUTPATIENT_CLINIC_OR_DEPARTMENT_OTHER): Payer: Medicare Other | Attending: General Surgery | Admitting: General Surgery

## 2022-08-29 DIAGNOSIS — M069 Rheumatoid arthritis, unspecified: Secondary | ICD-10-CM | POA: Diagnosis not present

## 2022-08-29 DIAGNOSIS — L97822 Non-pressure chronic ulcer of other part of left lower leg with fat layer exposed: Secondary | ICD-10-CM | POA: Insufficient documentation

## 2022-08-29 DIAGNOSIS — Z7952 Long term (current) use of systemic steroids: Secondary | ICD-10-CM | POA: Diagnosis not present

## 2022-08-29 DIAGNOSIS — L97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: Secondary | ICD-10-CM | POA: Diagnosis not present

## 2022-08-29 DIAGNOSIS — N182 Chronic kidney disease, stage 2 (mild): Secondary | ICD-10-CM | POA: Diagnosis not present

## 2022-08-29 DIAGNOSIS — G63 Polyneuropathy in diseases classified elsewhere: Secondary | ICD-10-CM | POA: Diagnosis not present

## 2022-08-29 DIAGNOSIS — I129 Hypertensive chronic kidney disease with stage 1 through stage 4 chronic kidney disease, or unspecified chronic kidney disease: Secondary | ICD-10-CM | POA: Diagnosis not present

## 2022-08-29 DIAGNOSIS — L97522 Non-pressure chronic ulcer of other part of left foot with fat layer exposed: Secondary | ICD-10-CM | POA: Diagnosis not present

## 2022-08-29 DIAGNOSIS — I872 Venous insufficiency (chronic) (peripheral): Secondary | ICD-10-CM | POA: Diagnosis not present

## 2022-08-30 NOTE — Progress Notes (Signed)
LONDA, MACKOWSKI ([id number]) [id number] Page 1 of 10 Visit Report for 08/29/2022 Arrival Information Details Patient Name: Date of Service: [name], [place] [name] [name] 08/29/2022 11:00 A M Medical Record Number: [id number] Patient Account Number: [id number] Date of Birth/Sex: Treating RN: July 13, 1941 (81 y.o. [name] Primary Care Shakendra Griffeth: Carolann Littler Other Clinician: Referring Francy Mcilvaine: Treating Fionna Merriott/Extender: [name] in Treatment: 3 Visit Information History Since Last Visit Added or deleted any medications: No Patient Arrived: [name] Any new allergies or adverse reactions: No Arrival Time: 11:17 Had a fall or experienced change in No Accompanied By: friend activities of daily living that may affect Transfer Assistance: None risk of falls: Patient Identification Verified: Yes Signs or symptoms of abuse/neglect since last visito No Patient Requires Transmission-Based Precautions: No Hospitalized since last visit: No Patient Has Alerts: No Implantable device outside of the clinic excluding No cellular tissue based products placed in the center since last visit: Has Dressing in Place as Prescribed: Yes Has Compression in Place as Prescribed: Yes Pain Present Now: No Electronic Signature(s) Signed: 08/29/2022 4:44:14 PM By: Dellie Catholic RN Entered By: Dellie Catholic on 08/29/2022 11:18:16 -------------------------------------------------------------------------------- Compression Therapy Details Patient Name: Date of Service: [name], [place] [name] [name] 08/29/2022 11:00 A M Medical Record Number: [id number] Patient Account Number: [id number] Date of Birth/Sex: Treating RN: [birth year] (81 y.o. [name] Primary Care Katriana Dortch: Carolann Littler Other Clinician: Referring Lashan Gluth: Treating Batina Dougan/Extender: [name] in Treatment: 3 Compression Therapy Performed for  Wound Assessment: Wound #6 Left,Anterior Lower Leg Performed By: Clinician Dellie Catholic, RN Compression Type: Three Layer Post Procedure Diagnosis Same as Pre-procedure Electronic Signature(s) Signed: 08/29/2022 4:44:14 PM By: Dellie Catholic RN Entered By: Dellie Catholic on 08/29/2022 11:45:56 [name] ([id number]) [id number] Page 2 of 10 -------------------------------------------------------------------------------- Encounter Discharge Information Details Patient Name: Date of Service: [name], [place] [name] [name] 08/29/2022 11:00 A M Medical Record Number: [id number] Patient Account Number: [id number] Date of Birth/Sex: Treating RN: Jul 11, 1941 (81 y.o. [name] Primary Care Bera Pinela: Carolann Littler Other Clinician: Referring Judee Hennick: Treating Eleanor Gatliff/Extender: [name] in Treatment: 3 Encounter Discharge Information Items Post Procedure Vitals Discharge Condition: Stable Temperature (F): 98.6 Ambulatory Status: Walker Pulse (bpm): 77 Discharge Destination: Home Respiratory Rate (breaths/min): 18 Transportation: Private Auto Blood Pressure (mmHg): 143/77 Accompanied By: friend Schedule Follow-up Appointment: Yes Clinical Summary of Care: Patient Declined Electronic Signature(s) Signed: 08/29/2022 4:44:14 PM By: Dellie Catholic RN Entered By: Dellie Catholic on 08/29/2022 16:41:58 -------------------------------------------------------------------------------- Lower Extremity Assessment Details Patient Name: Date of Service: [name], [place] [name] [name] 08/29/2022 11:00 A M Medical Record Number: [id number] Patient Account Number: [id number] Date of Birth/Sex: Treating RN: 08/21/1941 (81 y.o. [name] Primary Care Seanpaul Preece: Carolann Littler Other Clinician: Referring Karrah Mangini: Treating Ahriana Gunkel/Extender: [name] in Treatment: 3 Edema Assessment Assessed: [Left:  No] [Right: No] Edema: [Left: Ye] [Right: s] Calf Left: Right: Point of Measurement: From Medial Instep 32.5 cm Ankle Left: Right: Point of Measurement: From Medial Instep 1.5 cm Vascular Assessment Pulses: Dorsalis Pedis Palpable: [Left:Yes] Electronic Signature(s) Signed: 08/29/2022 4:44:14 PM By: Dellie Catholic RN Entered By: Dellie Catholic on 08/29/2022 11:18:52 Multi Wound Chart Details -------------------------------------------------------------------------------- [name] ([id number]) [id number] Page 3 of 10 Patient Name: Date of Service: [name], [place] [name] [name] 08/29/2022 11:00 A M Medical Record Number: [id number] Patient Account Number: [id number] Date of Birth/Sex: Treating RN: 04/14/1941 (81 y.o. F) Primary  Care Derisha Funderburke: Carolann Littler Other Clinician: Referring Domnique Vantine: Treating Avaley Coop/Extender: [name] in Treatment: 3 Vital Signs Height(in): 61 Pulse(bpm): 50 Weight(lbs): 128 Blood Pressure(mmHg): 143/77 Body Mass Index(BMI): 24.2 Temperature(F): 98.6 Respiratory Rate(breaths/min): 18 Wound Assessments Wound Number: 6 7 N/A Photos: N/A Left, Anterior Lower Leg Left, Plantar Foot N/A Wound Location: Trauma Gradually Appeared N/A Wounding Event: Venous Leg Ulcer Neuropathic Ulcer-Non Diabetic N/A Primary Etiology: Cataracts, Hypertension, Rheumatoid Cataracts, Hypertension, Rheumatoid N/A Comorbid History: Arthritis, Seizure Disorder Arthritis, Seizure Disorder 07/08/2022 06/24/2022 N/A Date Acquired: 3 3 N/A Weeks of Treatment: Open Open N/A Wound Status: No No N/A Wound Recurrence: 3x4.2x0.1 0.2x0.2x0.1 N/A Measurements L x W x D (cm) 9.896 0.031 N/A A (cm) : rea 0.99 0.003 N/A Volume (cm) : 69.70% 67.00% N/A % Reduction in A rea: 69.70% 89.30% N/A % Reduction in Volume: Full Thickness Without Exposed Full Thickness Without Exposed N/A Classification: Support  Structures Support Structures Medium Small N/A Exudate A mount: Serosanguineous Serosanguineous N/A Exudate Type: red, [name] red, [name] N/A Exudate Color: Flat and Intact Thickened N/A Wound Margin: Large (67-100%) Large (67-100%) N/A Granulation A mount: Red, Hyper-granulation Red, Pale N/A Granulation Quality: Small (1-33%) Small (1-33%) N/A Necrotic A mount: Fat Layer (Subcutaneous Tissue): Yes Fat Layer (Subcutaneous Tissue): Yes N/A Exposed Structures: Fascia: No Fascia: No Tendon: No Tendon: No Muscle: No Muscle: No Joint: No Joint: No Bone: No Bone: No Small (1-33%) None N/A Epithelialization: Debridement - Selective/Open Wound Debridement - Selective/Open Wound N/A Debridement: Pre-procedure Verification/Time Out 11:35 11:35 N/A Taken: Lidocaine 4% Topical Solution Lidocaine 4% T opical Solution N/A Pain Control: [employer], Slough N/A Tissue Debrided: Non-Viable Tissue Non-Viable Tissue N/A Level: 12.6 0.04 N/A Debridement A (sq cm): rea Curette Curette N/A Instrument: Minimum Minimum N/A Bleeding: Pressure Pressure N/A Hemostasis A chieved: 0 0 N/A Procedural Pain: 0 0 N/A Post Procedural Pain: Procedure was tolerated well Procedure was tolerated well N/A Debridement Treatment Response: 3x4.2x0.1 0.2x0.2x0.1 N/A Post Debridement Measurements L x W x D (cm) 0.99 0.003 N/A Post Debridement Volume: (cm) Excoriation: No Callus: Yes N/A Periwound Skin Texture: Induration: No Callus: No Crepitus: No Rash: No Scarring: No Maceration: No No Abnormalities Noted N/A Periwound Skin Moisture: Dry/Scaly: No Hemosiderin Staining: Yes No Abnormalities Noted N/A Periwound Skin Color: Atrophie Blanche: No Cyanosis: No [name], [name] ([id number]) [id number] Page 4 of 10 Ecchymosis: No Erythema: No Mottled: No Pallor: No Rubor: No No Abnormality No Abnormality N/A Temperature: Yes Yes N/A Tenderness on  Palpation: Compression Therapy Debridement N/A Procedures Performed: Debridement Treatment Notes Electronic Signature(s) Signed: 08/29/2022 12:44:02 PM By: Fredirick Maudlin MD FACS Entered By: Fredirick Maudlin on 08/29/2022 12:44:02 -------------------------------------------------------------------------------- Multi-Disciplinary Care Plan Details Patient Name: Date of Service: [name], [place] [name] [name] 08/29/2022 11:00 A M Medical Record Number: [id number] Patient Account Number: [id number] Date of Birth/Sex: Treating RN: 07-Jan-1941 (81 y.o. [name] Primary Care Jalise Zawistowski: Carolann Littler Other Clinician: Referring Zayon Trulson: Treating Lakyra Tippins/Extender: [name] in Treatment: 3 Multidisciplinary Care Plan reviewed with physician Active Inactive Abuse / Safety / Falls / Self Care Management Nursing Diagnoses: History of Falls Potential for falls Goals: Patient/caregiver will verbalize/demonstrate measures taken to prevent injury and/or falls Date Initiated: 08/07/2022 Target Resolution Date: 10/24/2022 Goal Status: Active Interventions: Assess fall risk on admission and as needed Assess impairment of mobility on admission and as needed per policy Notes: Venous Leg Ulcer Nursing Diagnoses: Knowledge deficit related to disease process and management Potential for venous Insuffiency (use before  diagnosis confirmed) Goals: Patient will maintain optimal edema control Date Initiated: 08/07/2022 Target Resolution Date: 10/24/2022 Goal Status: Active Interventions: Assess peripheral edema status every visit. Compression as ordered Provide education on venous insufficiency Treatment Activities: Therapeutic compression applied : 08/07/2022 Notes: [name], [name] ([id number]) [id number] Page 5 of 10 Wound/Skin Impairment Nursing Diagnoses: Impaired tissue integrity Knowledge deficit related to  ulceration/compromised skin integrity Goals: Patient/caregiver will verbalize understanding of skin care regimen Date Initiated: 08/07/2022 Target Resolution Date: 10/24/2022 Goal Status: Active Ulcer/skin breakdown will have a volume reduction of 30% by week 4 Date Initiated: 08/07/2022 Target Resolution Date: 10/24/2022 Goal Status: Active Interventions: Assess patient/caregiver ability to obtain necessary supplies Assess patient/caregiver ability to perform ulcer/skin care regimen upon admission and as needed Assess ulceration(s) every visit Provide education on ulcer and skin care Treatment Activities: Skin care regimen initiated : 08/07/2022 Topical wound management initiated : 08/07/2022 Notes: Electronic Signature(s) Signed: 08/29/2022 4:44:14 PM By: Dellie Catholic RN Entered By: Dellie Catholic on 08/29/2022 16:39:28 -------------------------------------------------------------------------------- Pain Assessment Details Patient Name: Date of Service: [name] [name] [name] 08/29/2022 11:00 A M Medical Record Number: [id number] Patient Account Number: [id number] Date of Birth/Sex: Treating RN: 1941/02/11 (81 y.o. [name] Primary Care Demarus Latterell: Carolann Littler Other Clinician: Referring Danyal Adorno: Treating Jessicah Croll/Extender: [name] in Treatment: 3 Active Problems Location of Pain Severity and Description of Pain Patient Has Paino No Site Locations Pain Management and Medication Current Pain Management: [name], [name] ([id number]) [id number] Page 6 of 10 Electronic Signature(s) Signed: 08/29/2022 4:44:14 PM By: Dellie Catholic RN Entered By: Dellie Catholic on 08/29/2022 11:18:46 -------------------------------------------------------------------------------- Patient/Caregiver Education Details Patient Name: Date of Service: [name] [name] [name] 12/6/2023andnbsp11:00 A M Medical Record Number:  [id number] Patient Account Number: [id number] Date of Birth/Gender: Treating RN: [birth year] (81 y.o. [name] Primary Care Physician: Carolann Littler Other Clinician: Referring Physician: Treating Physician/Extender: [name] in Treatment: 3 Education Assessment Education Provided To: Patient Education Topics Provided Wound/Skin Impairment: Methods: Explain/Verbal Responses: Return demonstration correctly Electronic Signature(s) Signed: 08/29/2022 4:44:14 PM By: Dellie Catholic RN Entered By: Dellie Catholic on 08/29/2022 16:39:42 -------------------------------------------------------------------------------- Wound Assessment Details Patient Name: Date of Service: [name], [place] [name] [name] 08/29/2022 11:00 A M Medical Record Number: [id number] Patient Account Number: [id number] Date of Birth/Sex: Treating RN: [birth year] (81 y.o. [name] Primary Care [name]: Carolann Littler Other Clinician: Referring Yossi Hinchman: Treating Shellee Streng/Extender: [name] in Treatment: 3 Wound Status Wound Number: 6 Primary Venous Leg Ulcer Etiology: Wound Location: Left, Anterior Lower Leg Wound Status: Open Wounding Event: Trauma Comorbid Cataracts, Hypertension, Rheumatoid Arthritis, Seizure Date Acquired: 07/08/2022 History: Disorder Weeks Of Treatment: 3 Clustered Wound: No Photos [name], [name] ([id number]) [id number] Page 7 of 10 Wound Measurements Length: (cm) 3 Width: (cm) 4.2 Depth: (cm) 0.1 Area: (cm) 9.896 Volume: (cm) 0.99 % Reduction in Area: 69.7% % Reduction in Volume: 69.7% Epithelialization: Small (1-33%) Tunneling: No Undermining: No Wound Description Classification: Full Thickness Without Exposed Support Structures Wound Margin: Flat and Intact Exudate Amount: Medium Exudate Type: Serosanguineous Exudate Color: red, [name] Foul Odor After Cleansing:  No Slough/Fibrino No Wound Bed Granulation Amount: Large (67-100%) Exposed Structure Granulation Quality: Red, Hyper-granulation Fascia Exposed: No Necrotic Amount: Small (1-33%) Fat Layer (Subcutaneous Tissue) Exposed: Yes Necrotic Quality: Adherent Slough Tendon Exposed: No Muscle Exposed: No Joint Exposed: No Bone Exposed: No Periwound Skin Texture Texture Color No Abnormalities Noted: Yes No Abnormalities Noted: No Atrophie Blanche:  No Moisture Cyanosis: No No Abnormalities Noted: Yes Ecchymosis: No Erythema: No Hemosiderin Staining: Yes Mottled: No Pallor: No Rubor: No Temperature / Pain Temperature: No Abnormality Tenderness on Palpation: Yes Treatment Notes Wound #6 (Lower Leg) Wound Laterality: Left, Anterior Cleanser Soap and Water Discharge Instruction: May shower and wash wound with dial antibacterial soap and water prior to dressing change. Wound Cleanser Discharge Instruction: Cleanse the wound with wound cleanser prior to applying a clean dressing using gauze sponges, not tissue or cotton balls. Peri-Wound Care Sween Lotion (Moisturizing lotion) Discharge Instruction: Apply moisturizing lotion as directed Topical Primary Dressing KerraCel Ag Gelling Fiber Dressing, 2x2 in (silver alginate) Discharge Instruction: Apply silver alginate to wound bed as instructed Secondary Dressing ABD Pad, 8x10 Discharge Instruction: Apply over primary dressing as directed. [name], [name] ([id number]) [id number] Page 8 of 10 Secured With Compression Wrap ThreePress (3 layer compression wrap) Discharge Instruction: Apply three layer compression as directed. Compression Stockings Add-Ons Electronic Signature(s) Signed: 08/29/2022 4:44:14 PM By: Dellie Catholic RN Entered By: Dellie Catholic on 08/29/2022 11:30:40 -------------------------------------------------------------------------------- Wound Assessment Details Patient Name: Date  of Service: [name], [place] [name] [name] 08/29/2022 11:00 A M Medical Record Number: [id number] Patient Account Number: [id number] Date of Birth/Sex: Treating RN: 12/27/1940 (81 y.o. [name] Primary Care [name]: Carolann Littler Other Clinician: Referring Breleigh Carpino: Treating Milayna Rotenberg/Extender: [name] in Treatment: 3 Wound Status Wound Number: 7 Primary Neuropathic Ulcer-Non Diabetic Etiology: Wound Location: Left, Plantar Foot Wound Status: Open Wounding Event: Gradually Appeared Comorbid Cataracts, Hypertension, Rheumatoid Arthritis, Seizure Date Acquired: 06/24/2022 History: Disorder Weeks Of Treatment: 3 Clustered Wound: No Photos Wound Measurements Length: (cm) 0.2 Width: (cm) 0.2 Depth: (cm) 0.1 Area: (cm) 0.031 Volume: (cm) 0.003 % Reduction in Area: 67% % Reduction in Volume: 89.3% Epithelialization: None Tunneling: No Undermining: No Wound Description Classification: Full Thickness Without Exposed Support Structures Wound Margin: Thickened Exudate Amount: Small Exudate Type: Serosanguineous Exudate Color: red, [name] Foul Odor After Cleansing: No Slough/Fibrino Yes Wound Bed Granulation Amount: Large (67-100%) Exposed Structure Granulation Quality: Red, Pale Fascia Exposed: No Necrotic Amount: Small (1-33%) Fat Layer (Subcutaneous Tissue) Exposed: Yes Necrotic Quality: Adherent Slough Tendon Exposed: No Muscle Exposed: No [name], [name] ([id number]) [id number] Page 9 of 10 Joint Exposed: No Bone Exposed: No Periwound Skin Texture Texture Color No Abnormalities Noted: No No Abnormalities Noted: Yes Callus: Yes Temperature / Pain Temperature: No Abnormality Moisture No Abnormalities Noted: Yes Tenderness on Palpation: Yes Treatment Notes Wound #7 (Foot) Wound Laterality: Plantar, Left Cleanser Soap and Water Discharge Instruction: May shower and wash wound with dial antibacterial  soap and water prior to dressing change. Wound Cleanser Discharge Instruction: Cleanse the wound with wound cleanser prior to applying a clean dressing using gauze sponges, not tissue or cotton balls. Peri-Wound Care Topical Primary Dressing KerraCel Ag Gelling Fiber Dressing, 2x2 in (silver alginate) Discharge Instruction: Apply silver alginate to wound bed as instructed Secondary Dressing Optifoam Non-Adhesive Dressing, 4x4 in Discharge Instruction: Apply over primary dressing cut to make foam donut Woven Gauze Sponges 2x2 in Discharge Instruction: Apply over primary dressing as directed. Secured With 90M Medipore H Soft Cloth Surgical T ape, 4 x 10 (in/yd) Discharge Instruction: Secure with tape as directed. Compression Wrap Compression Stockings Add-Ons Electronic Signature(s) Signed: 08/29/2022 4:44:14 PM By: Dellie Catholic RN Entered By: Dellie Catholic on 08/29/2022 11:32:29 -------------------------------------------------------------------------------- Vitals Details Patient Name: Date of Service: [name], [place] [name] [name] 08/29/2022 11:00 A M Medical Record Number: [id number] Patient Account  Number: [id number] Date of Birth/Sex: Treating RN: May 30, 1941 (81 y.o. [name] Primary Care Jayanna Kroeger: Carolann Littler Other Clinician: Referring Sakeenah Valcarcel: Treating Rochelle Nephew/Extender: [name] in Treatment: 3 Vital Signs Time Taken: 11:18 Temperature (F): 98.6 Height (in): 61 Pulse (bpm): 77 Weight (lbs): 128 Respiratory Rate (breaths/min): 18 Body Mass Index (BMI): 24.2 Blood Pressure (mmHg): 143/77 Reference Range: 80 - 120 mg / dl Electronic Signature(s) KARALYNE, NUSSER ([id number]) [id number] Page 10 of 10 Signed: 08/29/2022 4:44:14 PM By: Dellie Catholic RN Entered By: Dellie Catholic on 08/29/2022 11:18:40

## 2022-08-30 NOTE — Progress Notes (Addendum)
LUCRESHA, DISMUKE ([id number]) [id number] Page 1 of 11 Visit Report for 08/29/2022 Chief Complaint Document Details Patient Name: Date of Service: [name], [place] [name] [name] 08/29/2022 11:00 A M Medical Record Number: [id number] Patient Account Number: [id number] Date of Birth/Sex: Treating RN: 08-10-1941 (81 y.o. F) Primary Care Provider: Carolann Littler Other Clinician: Referring Provider: Treating Provider/Extender: [name] in Treatment: 3 Information Obtained from: Patient Chief Complaint Patient seen for complaints of Non-Healing Wound. Electronic Signature(s) Signed: 08/29/2022 12:44:09 PM By: Fredirick Maudlin MD FACS Entered By: Fredirick Maudlin on 08/29/2022 12:44:09 -------------------------------------------------------------------------------- Debridement Details Patient Name: Date of Service: [name], [place] [name] [name] 08/29/2022 11:00 A M Medical Record Number: [id number] Patient Account Number: [id number] Date of Birth/Sex: Treating RN: 28-Jun-1941 (81 y.o. [name] Primary Care Provider: Carolann Littler Other Clinician: Referring Provider: Treating Provider/Extender: [name] in Treatment: 3 Debridement Performed for Assessment: Wound #6 Left,Anterior Lower Leg Performed By: Physician Fredirick Maudlin, MD Debridement Type: Debridement Severity of Tissue Pre Debridement: Fat layer exposed Level of Consciousness (Pre-procedure): Awake and Alert Pre-procedure Verification/Time Out Yes - 11:35 Taken: Start Time: 11:35 Pain Control: Lidocaine 4% T opical Solution T Area Debrided (L x W): otal 3 (cm) x 4.2 (cm) = 12.6 (cm) Tissue and other material debrided: Non-Viable, Slough, Slough Level: Non-Viable Tissue Debridement Description: Selective/Open Wound Instrument: Curette Bleeding: Minimum Hemostasis Achieved: Pressure End Time: 11:37 Procedural Pain: 0 Post Procedural  Pain: 0 Response to Treatment: Procedure was tolerated well Level of Consciousness (Post- Awake and Alert procedure): Post Debridement Measurements of Total Wound Length: (cm) 3 Width: (cm) 4.2 Depth: (cm) 0.1 Volume: (cm) 0.99 Character of Wound/Ulcer Post Debridement: Improved Severity of Tissue Post Debridement: Fat layer exposed [name], [name] ([id number]) [id number] Page 2 of 11 Post Procedure Diagnosis Same as Pre-procedure Notes Scribed for Dr. Celine Ahr for [place] Signature(s) Signed: 08/29/2022 4:44:14 PM By: Dellie Catholic RN Signed: 08/29/2022 5:07:18 PM By: Fredirick Maudlin MD FACS Entered By: Dellie Catholic on 08/29/2022 11:43:58 -------------------------------------------------------------------------------- Debridement Details Patient Name: Date of Service: [name] [name] [name] 08/29/2022 11:00 A M Medical Record Number: [id number] Patient Account Number: [id number] Date of Birth/Sex: Treating RN: 1941/03/10 (81 y.o. [name] Primary Care Provider: Carolann Littler Other Clinician: Referring Provider: Treating Provider/Extender: [name] in Treatment: 3 Debridement Performed for Assessment: Wound #7 Left,Plantar Foot Performed By: Physician Fredirick Maudlin, MD Debridement Type: Debridement Level of Consciousness (Pre-procedure): Awake and Alert Pre-procedure Verification/Time Out Yes - 11:35 Taken: Start Time: 11:35 Pain Control: Lidocaine 4% T opical Solution T Area Debrided (L x W): otal 0.2 (cm) x 0.2 (cm) = 0.04 (cm) Tissue and other material debrided: Non-Viable, Callus, Slough, Slough Level: Non-Viable Tissue Debridement Description: Selective/Open Wound Instrument: Curette Bleeding: Minimum Hemostasis Achieved: Pressure End Time: 11:37 Procedural Pain: 0 Post Procedural Pain: 0 Response to Treatment: Procedure was tolerated well Level of Consciousness (Post-  Awake and Alert procedure): Post Debridement Measurements of Total Wound Length: (cm) 0.2 Width: (cm) 0.2 Depth: (cm) 0.1 Volume: (cm) 0.003 Character of Wound/Ulcer Post Debridement: Improved Post Procedure Diagnosis Same as Pre-procedure Notes Scribed for Dr. Celine Ahr by J.Scotton Electronic Signature(s) Signed: 08/29/2022 4:44:14 PM By: Dellie Catholic RN Signed: 08/29/2022 5:07:18 PM By: Fredirick Maudlin MD FACS Entered By: Dellie Catholic on 08/29/2022 11:45:18 [name] ([id number]) [id number] Page 3 of 11 -------------------------------------------------------------------------------- HPI Details Patient Name: Date of Service: [name], [place] [name] [name] 08/29/2022  11:00 A M Medical Record Number: [id number] Patient Account Number: [id number] Date of Birth/Sex: Treating RN: [birth year] (81 y.o. F) Primary Care Provider: Carolann Littler Other Clinician: Referring Provider: Treating Provider/Extender: [name] in Treatment: 3 History of Present Illness HPI Description: ADMISSION 11.14.2023 This is an 81 year old woman on chronic steroids and anticoagulants who fell near the beginning of October. She initially developed a hematoma on her left lower leg which subsequently ruptured. She presented to the emergency department on November 6 with concern for a wound infection due to developing erythema and warmth. The wound was debrided in the ED and clindamycin prescribed along with a recommendation for probiotics. Wet-to-dry dressings were prescribed, as well. She was seen again in the emergency room 3 days later for other symptoms. Photographs in the electronic medical record show that the wound is quite a bit cleaner. Wound care was consulted at that time. They recommended daily soap and water cleanse and Xeroform topped with an ABD and a Kerlix and Ace wrap. Upon her discharge from the hospital, she was referred to the  wound care center for further evaluation and management. ABI in clinic was 1.25. On intake, she was also noted to have an ulcer on her left midfoot associated with a fallen arch and callus. On her left anterior tibial surface, there is a large ovoid wound with some evidence of epithelialization. There is granulation tissue and slough present. No concern for infection. On the left midfoot, there is a large callus, in the center of which there is an ulceration exposing the fat layer. No malodor or purulent drainage. 08/14/2022: The left anterior tibial wound is smaller and cleaner. There is more epithelialization present. There is some slough accumulation on the surface. When her dressing was removed from the midfoot wound, pus drained out. The patient did say she had been having quite a bit more pain in her foot during the week. 08/21/2022: The culture that I took last week grew out very low levels of just some skin flora. The patient filled but did not take the antibiotic that I prescribed, as her primary care doctor put her on doxycycline for an upper respiratory infection. The anterior tibial wound is looking good. There is robust granulation tissue with a light layer of slough on the surface. The plantar midfoot wound has accumulated a thick callus once again. The opening in the skin is quite small. No purulent drainage present. 08/29/2022: The anterior tibial wound continues to contract. There is minimal slough and there has been greater epithelialization. She does have a bit of hypertrophic granulation tissue present. The plantar midfoot wound is once again covered and callus. The opening in the skin is basically unchanged. No concern for infection. Electronic Signature(s) Signed: 08/29/2022 12:44:56 PM By: Fredirick Maudlin MD FACS Entered By: Fredirick Maudlin on 08/29/2022 12:44:56 -------------------------------------------------------------------------------- Physical Exam Details Patient  Name: Date of Service: [name] [name] [name] 08/29/2022 11:00 A M Medical Record Number: [id number] Patient Account Number: [id number] Date of Birth/Sex: Treating RN: 07/24/1941 (81 y.o. F) Primary Care Provider: Carolann Littler Other Clinician: Referring Provider: Treating Provider/Extender: [name] in Treatment: 3 Constitutional Slightly hypertensive. . . . No acute distress. Respiratory Normal work of breathing on room air. Notes 08/29/2022: The anterior tibial wound continues to contract. There is minimal slough and there has been greater epithelialization. She does have a bit of hypertrophic granulation tissue present. The plantar midfoot wound is once again covered and callus. The  opening in the skin is basically unchanged. No concern for infection. Electronic Signature(s) [name], [name] ([id number]) [id number] Page 4 of 11 Signed: 08/29/2022 12:49:00 PM By: Fredirick Maudlin MD FACS Entered By: Fredirick Maudlin on 08/29/2022 12:49:00 -------------------------------------------------------------------------------- Physician Orders Details Patient Name: Date of Service: [name], [place] [name] [name] 08/29/2022 11:00 A M Medical Record Number: [id number] Patient Account Number: [id number] Date of Birth/Sex: Treating RN: 09/22/1941 (81 y.o. [name] Primary Care Provider: Carolann Littler Other Clinician: Referring Provider: Treating Provider/Extender: [name] in Treatment: 3 Verbal / Phone Orders: No Diagnosis Coding ICD-10 Coding Code Description [phone] Non-pressure chronic ulcer of other part of left lower leg with fat layer exposed L97.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 G63 Polyneuropathy in diseases classified elsewhere N18.2 Chronic kidney disease, stage 2 (mild) M06.9 Rheumatoid arthritis, unspecified I10 Essential (primary)  hypertension Z79.52 Long term (current) use of systemic steroids Follow-up Appointments ppointment in 1 week. - Dr. Celine Ahr RM 2 Return A Anesthetic Wound #6 Left,Anterior Lower Leg (In clinic) Topical Lidocaine 4% applied to wound bed Bathing/ Shower/ Hygiene May shower with protection but do not get wound dressing(s) wet. - may be purchased at [facility] or [facility] Edema Control - Lymphedema / SCD / Other Elevate legs to the level of the heart or above for 30 minutes daily and/or when sitting, a frequency of: - throughout the day while sitting Avoid standing for long periods of time. Patient to wear own compression stockings every day. - right leg Exercise regularly Wound Treatment Wound #6 - Lower Leg Wound Laterality: Left, Anterior Cleanser: Soap and Water 1 x Per Week/30 Days Discharge Instructions: May shower and wash wound with dial antibacterial soap and water prior to dressing change. Cleanser: Wound Cleanser 1 x Per Week/30 Days Discharge Instructions: Cleanse the wound with wound cleanser prior to applying a clean dressing using gauze sponges, not tissue or cotton balls. Peri-Wound Care: Sween Lotion (Moisturizing lotion) 1 x Per Week/30 Days Discharge Instructions: Apply moisturizing lotion as directed Prim Dressing: KerraCel Ag Gelling Fiber Dressing, 2x2 in (silver alginate) 1 x Per Week/30 Days ary Discharge Instructions: Apply silver alginate to wound bed as instructed Secondary Dressing: ABD Pad, 8x10 1 x Per Week/30 Days Discharge Instructions: Apply over primary dressing as directed. Compression Wrap: ThreePress (3 layer compression wrap) 1 x Per Week/30 Days Discharge Instructions: Apply three layer compression as directed. Wound #7 - Foot Wound Laterality: Plantar, Left Cleanser: Soap and Water 1 x Per Week/30 Days Discharge Instructions: May shower and wash wound with dial antibacterial soap and water prior to dressing change. Cleanser: Wound Cleanser 1 x Per Week/30  Days [name], [name] ([id number]) [id number] Page 5 of 11 Discharge Instructions: Cleanse the wound with wound cleanser prior to applying a clean dressing using gauze sponges, not tissue or cotton balls. Prim Dressing: KerraCel Ag Gelling Fiber Dressing, 2x2 in (silver alginate) 1 x Per Week/30 Days ary Discharge Instructions: Apply silver alginate to wound bed as instructed Secondary Dressing: Optifoam Non-Adhesive Dressing, 4x4 in 1 x Per Week/30 Days Discharge Instructions: Apply over primary dressing cut to make foam donut Secondary Dressing: Woven Gauze Sponges 2x2 in 1 x Per Week/30 Days Discharge Instructions: Apply over primary dressing as directed. Secured With: 35M Medipore H Soft Cloth Surgical T ape, 4 x 10 (in/yd) 1 x Per Week/30 Days Discharge Instructions: Secure with tape as directed. Electronic Signature(s) Signed: 08/29/2022 5:07:18 PM By: Fredirick Maudlin MD FACS  Entered By: Fredirick Maudlin on 08/29/2022 12:49:15 -------------------------------------------------------------------------------- Problem List Details Patient Name: Date of Service: [name] [name] [name] 08/29/2022 11:00 A M Medical Record Number: [id number] Patient Account Number: [id number] Date of Birth/Sex: Treating RN: Jul 26, 1941 (81 y.o. F) Primary Care Provider: Carolann Littler Other Clinician: Referring Provider: Treating Provider/Extender: [name] in Treatment: 3 Active Problems ICD-10 Encounter Code Description Active Date MDM Diagnosis [phone] Non-pressure chronic ulcer of other part of left lower leg with fat layer exposed11/14/2023 No Yes L97.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 08/07/2022 No Yes G63 Polyneuropathy in diseases classified elsewhere 08/07/2022 No Yes N18.2 Chronic kidney disease, stage 2 (mild) 08/07/2022 No Yes M06.9 Rheumatoid arthritis, unspecified 08/07/2022 No Yes I10  Essential (primary) hypertension 08/07/2022 No Yes Z79.52 Long term (current) use of systemic steroids 08/07/2022 No Yes Inactive Problems Resolved Problems [name], [name] ([id number]) [id number] Page 6 of 11 Electronic Signature(s) Signed: 08/29/2022 12:43:56 PM By: Fredirick Maudlin MD FACS Entered By: Fredirick Maudlin on 08/29/2022 12:43:56 -------------------------------------------------------------------------------- Progress Note Details Patient Name: Date of Service: [name] [name] [name] 08/29/2022 11:00 A M Medical Record Number: [id number] Patient Account Number: [id number] Date of Birth/Sex: Treating RN: Mar 16, 1941 (81 y.o. F) Primary Care Provider: Carolann Littler Other Clinician: Referring Provider: Treating Provider/Extender: [name] in Treatment: 3 Subjective Chief Complaint Information obtained from Patient Patient seen for complaints of Non-Healing Wound. History of Present Illness (HPI) ADMISSION 11.14.2023 This is an 81 year old woman on chronic steroids and anticoagulants who fell near the beginning of October. She initially developed a hematoma on her left lower leg which subsequently ruptured. She presented to the emergency department on November 6 with concern for a wound infection due to developing erythema and warmth. The wound was debrided in the ED and clindamycin prescribed along with a recommendation for probiotics. Wet-to-dry dressings were prescribed, as well. She was seen again in the emergency room 3 days later for other symptoms. Photographs in the electronic medical record show that the wound is quite a bit cleaner. Wound care was consulted at that time. They recommended daily soap and water cleanse and Xeroform topped with an ABD and a Kerlix and Ace wrap. Upon her discharge from the hospital, she was referred to the wound care center for further evaluation and management. ABI in clinic  was 1.25. On intake, she was also noted to have an ulcer on her left midfoot associated with a fallen arch and callus. On her left anterior tibial surface, there is a large ovoid wound with some evidence of epithelialization. There is granulation tissue and slough present. No concern for infection. On the left midfoot, there is a large callus, in the center of which there is an ulceration exposing the fat layer. No malodor or purulent drainage. 08/14/2022: The left anterior tibial wound is smaller and cleaner. There is more epithelialization present. There is some slough accumulation on the surface. When her dressing was removed from the midfoot wound, pus drained out. The patient did say she had been having quite a bit more pain in her foot during the week. 08/21/2022: The culture that I took last week grew out very low levels of just some skin flora. The patient filled but did not take the antibiotic that I prescribed, as her primary care doctor put her on doxycycline for an upper respiratory infection. The anterior tibial wound is looking good. There is robust granulation tissue with a  light layer of slough on the surface. The plantar midfoot wound has accumulated a thick callus once again. The opening in the skin is quite small. No purulent drainage present. 08/29/2022: The anterior tibial wound continues to contract. There is minimal slough and there has been greater epithelialization. She does have a bit of hypertrophic granulation tissue present. The plantar midfoot wound is once again covered and callus. The opening in the skin is basically unchanged. No concern for infection. Patient History Information obtained from Patient, Chart. Family History Cancer - Mother,Siblings,Maternal Grandparents, Hypertension - Father, Seizures - Mother, Stroke - Mother, No family history of Diabetes, Heart Disease, Hereditary Spherocytosis, Kidney Disease, Lung Disease, Thyroid Problems,  Tuberculosis. Social History Never smoker, Marital Status - Widowed, Alcohol Use - Rarely, Drug Use - No History, Caffeine Use - Daily - tea. Medical History Eyes Patient has history of Cataracts - bil extracted Denies history of Glaucoma, Optic Neuritis Cardiovascular Patient has history of Hypertension Genitourinary Denies history of End Stage Renal Disease Integumentary (Skin) Denies history of History of Burn Musculoskeletal Patient has history of Rheumatoid Arthritis Neurologic Patient has history of Seizure Disorder - hx Oncologic Denies history of Received Chemotherapy, Received Radiation Psychiatric Denies history of [place] [address] [name], [name] ([id number]) [id number] Page 7 of 11 Medical A Surgical History Notes nd Respiratory hx pulmonary fibrosis Cardiovascular hx temporal arteritis, hyperlipidemia Gastrointestinal GERD, diverticulosis Genitourinary CRI stage 2 Musculoskeletal osteoporosis Neurologic TIA, CVA, chronic back pain Oncologic hx breast CA Objective Constitutional Slightly hypertensive. No acute distress. Vitals Time Taken: 11:18 AM, Height: 61 in, Weight: 128 lbs, BMI: 24.2, Temperature: 98.6 F, Pulse: 77 bpm, Respiratory Rate: 18 breaths/min, Blood Pressure: 143/77 mmHg. Respiratory Normal work of breathing on room air. General Notes: 08/29/2022: The anterior tibial wound continues to contract. There is minimal slough and there has been greater epithelialization. She does have a bit of hypertrophic granulation tissue present. The plantar midfoot wound is once again covered and callus. The opening in the skin is basically unchanged. No concern for infection. Integumentary (Hair, Skin) Wound #6 status is Open. Original cause of wound was Trauma. The date acquired was: 07/08/2022. The wound has been in treatment 3 weeks. The wound is located on the Left,Anterior Lower Leg. The wound  measures 3cm length x 4.2cm width x 0.1cm depth; 9.896cm^2 area and 0.99cm^3 volume. There is Fat Layer (Subcutaneous Tissue) exposed. There is no tunneling or undermining noted. There is a medium amount of serosanguineous drainage noted. The wound margin is flat and intact. There is large (67-100%) red, hyper - granulation within the wound bed. There is a small (1-33%) amount of necrotic tissue within the wound bed including Adherent Slough. The periwound skin appearance had no abnormalities noted for texture. The periwound skin appearance had no abnormalities noted for moisture. The periwound skin appearance exhibited: Hemosiderin Staining. The periwound skin appearance did not exhibit: Atrophie Blanche, Cyanosis, Ecchymosis, Mottled, Pallor, Rubor, Erythema. Periwound temperature was noted as No Abnormality. The periwound has tenderness on palpation. Wound #7 status is Open. Original cause of wound was Gradually Appeared. The date acquired was: 06/24/2022. The wound has been in treatment 3 weeks. The wound is located on the [place]. The wound measures 0.2cm length x 0.2cm width x 0.1cm depth; 0.031cm^2 area and 0.003cm^3 volume. There is Fat Layer (Subcutaneous Tissue) exposed. There is no tunneling or undermining noted. There is a small amount of serosanguineous drainage noted. The wound margin is thickened. There is large (67-100%) red, pale granulation within  the wound bed. There is a small (1-33%) amount of necrotic tissue within the wound bed including Adherent Slough. The periwound skin appearance had no abnormalities noted for moisture. The periwound skin appearance had no abnormalities noted for color. The periwound skin appearance exhibited: Callus. Periwound temperature was noted as No Abnormality. The periwound has tenderness on palpation. Assessment Active Problems ICD-10 Non-pressure chronic ulcer of other part of left lower leg with fat layer exposed Non-pressure chronic  ulcer of left heel and midfoot with fat layer exposed Polyneuropathy in diseases classified elsewhere Chronic kidney disease, stage 2 (mild) Rheumatoid arthritis, unspecified Essential (primary) hypertension Long term (current) use of systemic steroids Procedures Wound #6 Pre-procedure diagnosis of Wound #6 is a Venous Leg Ulcer located on the Left,Anterior Lower Leg .Severity of Tissue Pre Debridement is: Fat layer exposed. There was a Selective/Open Wound Non-Viable Tissue Debridement with a total area of 12.6 sq cm performed by Fredirick Maudlin, MD. With the following instrument(s): Curette to remove Non-Viable tissue/material. Material removed includes [facility] after achieving pain control using Lidocaine 4% Topical Solution. [name], [name] ([id number]) [id number] Page 8 of 11 No specimens were taken. A time out was conducted at 11:35, prior to the start of the procedure. A Minimum amount of bleeding was controlled with Pressure. The procedure was tolerated well with a pain level of 0 throughout and a pain level of 0 following the procedure. Post Debridement Measurements: 3cm length x 4.2cm width x 0.1cm depth; 0.99cm^3 volume. Character of Wound/Ulcer Post Debridement is improved. Severity of Tissue Post Debridement is: Fat layer exposed. Post procedure Diagnosis Wound #6: Same as Pre-Procedure General Notes: Scribed for Dr. Celine Ahr for [place]. Pre-procedure diagnosis of Wound #6 is a Venous Leg Ulcer located on the Left,Anterior Lower Leg . There was a Three Layer Compression Therapy Procedure by Dellie Catholic, RN. Post procedure Diagnosis Wound #6: Same as Pre-Procedure Wound #7 Pre-procedure diagnosis of Wound #7 is a Neuropathic Ulcer-Non Diabetic located on the Left,Plantar Foot . There was a Selective/Open Wound Non-Viable Tissue Debridement with a total area of 0.04 sq cm performed by Fredirick Maudlin, MD. With the following instrument(s): Curette  to remove Non-Viable tissue/material. Material removed includes Callus and Slough and after achieving pain control using Lidocaine 4% Topical Solution. No specimens were taken. A time out was conducted at 11:35, prior to the start of the procedure. A Minimum amount of bleeding was controlled with Pressure. The procedure was tolerated well with a pain level of 0 throughout and a pain level of 0 following the procedure. Post Debridement Measurements: 0.2cm length x 0.2cm width x 0.1cm depth; 0.003cm^3 volume. Character of Wound/Ulcer Post Debridement is improved. Post procedure Diagnosis Wound #7: Same as Pre-Procedure General Notes: Scribed for Dr. Celine Ahr by J.Scotton. Plan Follow-up Appointments: Return Appointment in 1 week. - Dr. Celine Ahr RM 2 Anesthetic: Wound #6 Left,Anterior Lower Leg: (In clinic) Topical Lidocaine 4% applied to wound bed Bathing/ Shower/ Hygiene: May shower with protection but do not get wound dressing(s) wet. - may be purchased at [facility] or [facility] Edema Control - Lymphedema / SCD / Other: Elevate legs to the level of the heart or above for 30 minutes daily and/or when sitting, a frequency of: - throughout the day while sitting Avoid standing for long periods of time. Patient to wear own compression stockings every day. - right leg Exercise regularly WOUND #6: - Lower Leg Wound Laterality: Left, Anterior Cleanser: Soap and Water 1 x Per Week/30 Days Discharge Instructions: May shower and wash  wound with dial antibacterial soap and water prior to dressing change. Cleanser: Wound Cleanser 1 x Per Week/30 Days Discharge Instructions: Cleanse the wound with wound cleanser prior to applying a clean dressing using gauze sponges, not tissue or cotton balls. Peri-Wound Care: Sween Lotion (Moisturizing lotion) 1 x Per Week/30 Days Discharge Instructions: Apply moisturizing lotion as directed Prim Dressing: KerraCel Ag Gelling Fiber Dressing, 2x2 in (silver alginate) 1 x Per  Week/30 Days ary Discharge Instructions: Apply silver alginate to wound bed as instructed Secondary Dressing: ABD Pad, 8x10 1 x Per Week/30 Days Discharge Instructions: Apply over primary dressing as directed. Com pression Wrap: ThreePress (3 layer compression wrap) 1 x Per Week/30 Days Discharge Instructions: Apply three layer compression as directed. WOUND #7: - Foot Wound Laterality: Plantar, Left Cleanser: Soap and Water 1 x Per Week/30 Days Discharge Instructions: May shower and wash wound with dial antibacterial soap and water prior to dressing change. Cleanser: Wound Cleanser 1 x Per Week/30 Days Discharge Instructions: Cleanse the wound with wound cleanser prior to applying a clean dressing using gauze sponges, not tissue or cotton balls. Prim Dressing: KerraCel Ag Gelling Fiber Dressing, 2x2 in (silver alginate) 1 x Per Week/30 Days ary Discharge Instructions: Apply silver alginate to wound bed as instructed Secondary Dressing: Optifoam Non-Adhesive Dressing, 4x4 in 1 x Per Week/30 Days Discharge Instructions: Apply over primary dressing cut to make foam donut Secondary Dressing: Woven Gauze Sponges 2x2 in 1 x Per Week/30 Days Discharge Instructions: Apply over primary dressing as directed. Secured With: 3M Medipore H Soft Cloth Surgical T ape, 4 x 10 (in/yd) 1 x Per Week/30 Days Discharge Instructions: Secure with tape as directed. 08/29/2022: The anterior tibial wound continues to contract. There is minimal slough and there has been greater epithelialization. She does have a bit of hypertrophic granulation tissue present. The plantar midfoot wound is once again covered and callus. The opening in the skin is basically unchanged. No concern for infection. I used a curette to debride the slough from the anterior tibial wound and then chemically cauterized the hypertrophic granulation tissue with silver nitrate. I debrided callus and slough from the plantar foot wound. We will continue  silver alginate and 3 layer compression for the leg; silver alginate and foam donut for the foot. Follow-up in 1 week. Electronic Signature(s) Signed: 08/29/2022 12:54:47 PM By: Fredirick Maudlin MD FACS Entered By: Fredirick Maudlin on 08/29/2022 12:54:47 [name] ([id number]) [id number] Page 9 of 11 -------------------------------------------------------------------------------- HxROS Details Patient Name: Date of Service: [name], [place] [name] [name] 08/29/2022 11:00 A M Medical Record Number: [id number] Patient Account Number: [id number] Date of Birth/Sex: Treating RN: 03-05-1941 (81 y.o. F) Primary Care Provider: Carolann Littler Other Clinician: Referring Provider: Treating Provider/Extender: [name] in Treatment: 3 Information Obtained From Patient Chart Eyes Medical History: Positive for: Cataracts - bil extracted Negative for: Glaucoma; Optic Neuritis Respiratory Medical History: Past Medical History Notes: hx pulmonary fibrosis Cardiovascular Medical History: Positive for: Hypertension Past Medical History Notes: hx temporal arteritis, hyperlipidemia Gastrointestinal Medical History: Past Medical History Notes: GERD, diverticulosis Genitourinary Medical History: Negative for: End Stage Renal Disease Past Medical History Notes: CRI stage 2 Integumentary (Skin) Medical History: Negative for: History of Burn Musculoskeletal Medical History: Positive for: Rheumatoid Arthritis Past Medical History Notes: osteoporosis Neurologic Medical History: Positive for: Seizure Disorder - hx Past Medical History Notes: TIA, CVA, chronic back pain Oncologic Medical History: Negative for: Received Chemotherapy; Received Radiation Past Medical History Notes: hx breast CA  Psychiatric Medical History: Negative for: [name] [name], [name] ([id number])  [id number] Page 10 of 11 HBO Extended History Items Eyes: Cataracts Immunizations Pneumococcal Vaccine: Received Pneumococcal Vaccination: Yes Received Pneumococcal Vaccination On or After 60th Birthday: Yes Implantable Devices No devices added Family and Social History Cancer: Yes - Mother,Siblings,Maternal Grandparents; Diabetes: No; Heart Disease: No; Hereditary Spherocytosis: No; Hypertension: Yes - Father; Kidney Disease: No; Lung Disease: No; Seizures: Yes - Mother; Stroke: Yes - Mother; Thyroid Problems: No; Tuberculosis: No; Never smoker; Marital Status - Widowed; Alcohol Use: Rarely; Drug Use: No History; Caffeine Use: Daily - tea; Financial Concerns: No; Food, Clothing or Shelter Needs: No; Support System Lacking: No Electronic Signature(s) Signed: 08/29/2022 5:07:18 PM By: Fredirick Maudlin MD FACS Entered By: Fredirick Maudlin on 08/29/2022 12:45:42 -------------------------------------------------------------------------------- SuperBill Details Patient Name: Date of Service: [name], [place] [name] [name] 08/29/2022 Medical Record Number: [id number] Patient Account Number: [id number] Date of Birth/Sex: Treating RN: September 30, 1940 (81 y.o. F) Primary Care Provider: Carolann Littler Other Clinician: Referring Provider: Treating Provider/Extender: [name] in Treatment: 3 Diagnosis Coding ICD-10 Codes Code Description [phone] Non-pressure chronic ulcer of other part of left lower leg with fat layer exposed L97.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 G63 Polyneuropathy in diseases classified elsewhere N18.2 Chronic kidney disease, stage 2 (mild) M06.9 Rheumatoid arthritis, unspecified I10 Essential (primary) hypertension Z79.52 Long term (current) use of systemic steroids Facility Procedures : CPT4 Code: [id number] Description: 97597 - DEBRIDE WOUND 1ST 20 SQ CM OR < ICD-10 Diagnosis  Description L97.822 Non-pressure chronic ulcer of other part of left lower leg with fat layer expose L97.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 Modifier: d Quantity: 1 Physician Procedures : CPT4 Code Description Modifier [id number] - WC PHYS LEVEL 3 - EST PT 25 ICD-10 Diagnosis Description L97.822 Non-pressure chronic ulcer of other part of left lower leg with fat layer exposed L97.422 Non-pressure chronic ulcer of left heel and  midfoot with fat layer exposed Z79.52 Long term (current) use of systemic steroids G63 Polyneuropathy in diseases classified elsewhere [name], [name] ([id number]) [id number] - WC PHYS DEBR WO ANESTH 20 SQ CM 1  ICD-10 Diagnosis Description L97.822 Non-pressure chronic ulcer of other part of left lower leg with fat layer exposed L97.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 Quantity: 1 27.pdf Page 11 of 11 Electronic Signature(s) Signed: 08/29/2022 12:55:11 PM By: Fredirick Maudlin MD FACS Entered By: Fredirick Maudlin on 08/29/2022 12:55:10

## 2022-08-30 NOTE — Patient Outreach (Signed)
  Care Coordination   Follow Up Visit Note   08/30/2022 Name: [name] MRN: [id number] DOB: Apr 30, 1941  [name] is a 81 y.o. year old female who sees Burchette, Alinda Sierras, MD for primary care. I spoke with  [name] by phone today.  What matters to the patients health and wellness today?  Supportive services    Goals Addressed             This Visit's Progress    LCSW-Increase activity outside of home/Strengthen Support System   On track    Care Coordination Interventions: Solution-Focused Strategies employed:  Active listening / Reflection utilized  Emotional Support Provided Verbalization of feelings encouraged  Patient is visiting with wound care once a week. Leg is healing well. Focusing on bottom on foot; however, it's getting better Pt reports compliance with medications, which are mailed to residence Pt calls monthly for Oxycontin to [facility]. They encouraged pt to have PCP call meds in early to prevent delay in pt receiving through mail. Pt reports a 2-3 day delay in obtaining meds negatively impacting pain management Patient continues to practice gratitude thinking and is appreciative for support from medical staff Validation and encouragement provided Participating in PT once weekly in the home, feels it is helpful. Had a visit from social worker once Friend from [place] visits weekly and takes her to the appt and provide support during appts to remind her. Explained benefits of having that type of support         SDOH assessments and interventions completed:  No     Care Coordination Interventions:  Yes, provided   Follow up plan: Follow up call scheduled for 4-6 weeks    Encounter Outcome:  Pt. Visit Completed   [name], MSW, [email or web address] Phone [phone] 5:17 PM

## 2022-08-30 NOTE — Patient Instructions (Signed)
Visit Information  Thank you for taking time to visit with me today. Please don't hesitate to contact me if I can be of assistance to you.   Following are the goals we discussed today:   Goals Addressed             This Visit's Progress    LCSW-Increase activity outside of home/Strengthen Support System   On track    Care Coordination Interventions: Solution-Focused Strategies employed:  Active listening / Reflection utilized  Emotional Support Provided Verbalization of feelings encouraged  Patient is visiting with wound care once a week. Leg is healing well. Focusing on bottom on foot; however, it's getting better Pt reports compliance with medications, which are mailed to residence Pt calls monthly for Oxycontin to [facility]. They encouraged pt to have PCP call meds in early to prevent delay in pt receiving through mail. Pt reports a 2-3 day delay in obtaining meds negatively impacting pain management Patient continues to practice gratitude thinking and is appreciative for support from medical staff Validation and encouragement provided Participating in PT once weekly in the home, feels it is helpful. Had a visit from social worker once Friend from [place] visits weekly and takes her to the appt and provide support during appts to remind her. Explained benefits of having that type of support         Our next appointment is by telephone on 10/17/22 at 3 PM  Please call the care guide team at [phone] if you need to cancel or reschedule your appointment.   If you are experiencing a Mental Health or [place] or need someone to talk to, please call the Suicide and Crisis Lifeline: 988 call 911   Patient verbalizes understanding of instructions and care plan provided today and agrees to view in [place]. Active MyChart status and patient understanding of how to access instructions and care plan via MyChart confirmed with patient.     Christa See, MSW,  [email or web address] Phone [phone] 5:17 PM

## 2022-09-03 ENCOUNTER — Other Ambulatory Visit: Payer: Self-pay | Admitting: Family Medicine

## 2022-09-03 ENCOUNTER — Other Ambulatory Visit: Payer: Self-pay | Admitting: Neurology

## 2022-09-03 MED ORDER — OXYCODONE HCL ER 10 MG PO T12A: 10.0000 mg | EXTENDED_RELEASE_TABLET | Freq: Two times a day (BID) | ORAL | 0 refills | Status: DC

## 2022-09-03 NOTE — Telephone Encounter (Signed)
Prescriptions have been renewed with [facility]  Eulas Post MD [facility]

## 2022-09-03 NOTE — Addendum Note (Signed)
Addended by: Eulas Post on: 09/03/2022 01:05 PM   Modules accepted: Orders

## 2022-09-03 NOTE — Telephone Encounter (Signed)
Noted  

## 2022-09-03 NOTE — Telephone Encounter (Signed)
Pt called to request a refill of the following: oxyCODONE (OXYCONTIN) 10 mg 12 hr tablet    [place], [place] - [address] Phone: [phone]  Fax: [phone]     LOV:  08/14/22

## 2022-09-04 DIAGNOSIS — R5383 Other fatigue: Secondary | ICD-10-CM | POA: Diagnosis not present

## 2022-09-04 DIAGNOSIS — Z111 Encounter for screening for respiratory tuberculosis: Secondary | ICD-10-CM | POA: Diagnosis not present

## 2022-09-04 DIAGNOSIS — Z79899 Other long term (current) drug therapy: Secondary | ICD-10-CM | POA: Diagnosis not present

## 2022-09-04 DIAGNOSIS — H52202 Unspecified astigmatism, left eye: Secondary | ICD-10-CM | POA: Diagnosis not present

## 2022-09-04 DIAGNOSIS — M0589 Other rheumatoid arthritis with rheumatoid factor of multiple sites: Secondary | ICD-10-CM | POA: Diagnosis not present

## 2022-09-04 DIAGNOSIS — H4720 Unspecified optic atrophy: Secondary | ICD-10-CM | POA: Diagnosis not present

## 2022-09-05 ENCOUNTER — Encounter (HOSPITAL_BASED_OUTPATIENT_CLINIC_OR_DEPARTMENT_OTHER): Payer: Medicare Other | Admitting: Internal Medicine

## 2022-09-05 DIAGNOSIS — G63 Polyneuropathy in diseases classified elsewhere: Secondary | ICD-10-CM | POA: Diagnosis not present

## 2022-09-05 DIAGNOSIS — L97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: Secondary | ICD-10-CM | POA: Diagnosis not present

## 2022-09-05 DIAGNOSIS — M069 Rheumatoid arthritis, unspecified: Secondary | ICD-10-CM | POA: Diagnosis not present

## 2022-09-05 DIAGNOSIS — I872 Venous insufficiency (chronic) (peripheral): Secondary | ICD-10-CM | POA: Diagnosis not present

## 2022-09-05 DIAGNOSIS — L97822 Non-pressure chronic ulcer of other part of left lower leg with fat layer exposed: Secondary | ICD-10-CM | POA: Diagnosis not present

## 2022-09-05 DIAGNOSIS — I129 Hypertensive chronic kidney disease with stage 1 through stage 4 chronic kidney disease, or unspecified chronic kidney disease: Secondary | ICD-10-CM | POA: Diagnosis not present

## 2022-09-05 DIAGNOSIS — N182 Chronic kidney disease, stage 2 (mild): Secondary | ICD-10-CM | POA: Diagnosis not present

## 2022-09-06 NOTE — Progress Notes (Signed)
TELESIA, ATES ([id number]) [id number] Page 1 of 7 Visit Report for 09/05/2022 Debridement Details Patient Name: Date of Service: [name], [name] 09/05/2022 8:45 A M Medical Record Number: [id number] Patient Account Number: [id number] Date of Birth/Sex: Treating RN: 1941/03/15 (81 y.o. F) Primary Care Provider: Carolann Littler Other Clinician: Referring Provider: Treating Provider/Extender: Jettie Pagan in Treatment: 4 Debridement Performed for Assessment: Wound #6 Left,Anterior Lower Leg Performed By: Physician Ricard Dillon., MD Debridement Type: Debridement Severity of Tissue Pre Debridement: Fat layer exposed Level of Consciousness (Pre-procedure): Awake and Alert Pre-procedure Verification/Time Out Yes - 09:00 Taken: Start Time: 09:00 Pain Control: Lidocaine T Area Debrided (L x W): otal 2 (cm) x 3 (cm) = 6 (cm) Tissue and other material debrided: Viable, Non-Viable, Slough, Skin: Dermis , Skin: Epidermis, Slough Level: Skin/Epidermis Debridement Description: Selective/Open Wound Instrument: Curette Bleeding: Minimum Hemostasis Achieved: Pressure End Time: 09:00 Procedural Pain: 0 Post Procedural Pain: 0 Response to Treatment: Procedure was tolerated well Level of Consciousness (Post- Awake and Alert procedure): Post Debridement Measurements of Total Wound Length: (cm) 2 Width: (cm) 3 Depth: (cm) 0.1 Volume: (cm) 0.471 Character of Wound/Ulcer Post Debridement: Improved Severity of Tissue Post Debridement: Fat layer exposed Post Procedure Diagnosis Same as Pre-procedure Electronic Signature(s) Signed: 09/05/2022 5:47:10 PM By: Linton Ham MD Entered By: Linton Ham on 09/05/2022 09:13:04 -------------------------------------------------------------------------------- HPI Details Patient Name: Date of Service: [name], [name] RO [name] 09/05/2022 8:45 A M Medical Record Number:  [id number] Patient Account Number: [id number] Date of Birth/Sex: Treating RN: 19-Aug-1941 (81 y.o. F) Primary Care Provider: Carolann Littler Other Clinician: Referring Provider: Treating Provider/Extender: Jettie Pagan in Treatment: 4 History of Present Illness [name], [name] ([id number]) [id number] Page 2 of 7 HPI Description: ADMISSION 11.14.2023 This is an 81 year old woman on chronic steroids and anticoagulants who fell near the beginning of October. She initially developed a hematoma on her left lower leg which subsequently ruptured. She presented to the emergency department on November 6 with concern for a wound infection due to developing erythema and warmth. The wound was debrided in the ED and clindamycin prescribed along with a recommendation for probiotics. Wet-to-dry dressings were prescribed, as well. She was seen again in the emergency room 3 days later for other symptoms. Photographs in the electronic medical record show that the wound is quite a bit cleaner. Wound care was consulted at that time. They recommended daily soap and water cleanse and Xeroform topped with an ABD and a Kerlix and Ace wrap. Upon her discharge from the hospital, she was referred to the wound care center for further evaluation and management. ABI in clinic was 1.25. On intake, she was also noted to have an ulcer on her left midfoot associated with a fallen arch and callus. On her left anterior tibial surface, there is a large ovoid wound with some evidence of epithelialization. There is granulation tissue and slough present. No concern for infection. On the left midfoot, there is a large callus, in the center of which there is an ulceration exposing the fat layer. No malodor or purulent drainage. 08/14/2022: The left anterior tibial wound is smaller and cleaner. There is more epithelialization present. There is some slough accumulation on the  surface. When her dressing was removed from the midfoot wound, pus drained out. The patient did say she had been having quite a bit more pain in her foot during the week. 08/21/2022: The culture that I took last week  grew out very low levels of just some skin flora. The patient filled but did not take the antibiotic that I prescribed, as her primary care doctor put her on doxycycline for an upper respiratory infection. The anterior tibial wound is looking good. There is robust granulation tissue with a light layer of slough on the surface. The plantar midfoot wound has accumulated a thick callus once again. The opening in the skin is quite small. No purulent drainage present. 08/29/2022: The anterior tibial wound continues to contract. There is minimal slough and there has been greater epithelialization. She does have a bit of hypertrophic granulation tissue present. The plantar midfoot wound is once again covered and callus. The opening in the skin is basically unchanged. No concern for infection. 12/13; thick eschar on the anterior tibial wound. The wound on her left foot has healed. This is in the medial part of the foot. She has thick hemorrhagic callus but no open wound in this area. Electronic Signature(s) Signed: 09/05/2022 5:47:10 PM By: Linton Ham MD Entered By: Linton Ham on 09/05/2022 09:14:58 -------------------------------------------------------------------------------- Physical Exam Details Patient Name: Date of Service: [name], [name] RO [name] 09/05/2022 8:45 A M Medical Record Number: [id number] Patient Account Number: [id number] Date of Birth/Sex: Treating RN: 1941/05/28 (81 y.o. F) Primary Care Provider: Carolann Littler Other Clinician: Referring Provider: Treating Provider/Extender: Jettie Pagan in Treatment: 4 Constitutional Sitting or standing Blood Pressure is within target range for patient.. Pulse regular and within target range  for patient.Marland Kitchen Respirations regular, non-labored and within target range.. Temperature is normal and within the target range for the patient.Marland Kitchen Appears in no distress. Notes Wound exam; the anterior tibia wound required removal of the surface eschar. Underneath this is contracted nicely she has 1 small open area but it as healthy looking granulation no further debridement was required. Her edema control is good On the plantar foot there is no open wound however she has thick hemorrhagic callus. This would put her at risk for recurrent wounds in this area. She has been to see podiatry about this Electronic Signature(s) Signed: 09/05/2022 5:47:10 PM By: Linton Ham MD Entered By: Linton Ham on 09/05/2022 09:16:32 -------------------------------------------------------------------------------- Physician Orders Details Patient Name: Date of Service: [name], [name] RO [name] 09/05/2022 8:45 A M Medical Record Number: [id number] Patient Account Number: [id number] Date of Birth/Sex: Treating RN: 20-Oct-1940 (81 y.o. Naylin, Burkle, [name] ([id number]) [id number] Page 3 of 7 Primary Care Provider: Carolann Littler Other Clinician: Referring Provider: Treating Provider/Extender: Jettie Pagan in Treatment: 4 Verbal / Phone Orders: No Diagnosis Coding Follow-up Appointments ppointment in 2 weeks. - (when pt. gets back from vacation) w/ Dr. Celine Ahr Return A Other: - T wrap off on 09/14/22 before you leave to go to your sons house and place silver alginate on wound with bandaid then your compression ake stocking. Change every other day until you come back. Anesthetic Wound #6 Left,Anterior Lower Leg (In clinic) Topical Lidocaine 4% applied to wound bed Bathing/ Shower/ Hygiene May shower with protection but do not get wound dressing(s) wet. - may be purchased at [facility] or [facility] Edema Control - Lymphedema / SCD /  Other Elevate legs to the level of the heart or above for 30 minutes daily and/or when sitting, a frequency of: - throughout the day while sitting Avoid standing for long periods of time. Patient to wear own compression stockings every day. - right leg Exercise regularly Wound Treatment Wound #6 -  Lower Leg Wound Laterality: Left, Anterior Cleanser: Soap and Water 1 x Per Week/30 Days Discharge Instructions: May shower and wash wound with dial antibacterial soap and water prior to dressing change. Cleanser: Wound Cleanser 1 x Per Week/30 Days Discharge Instructions: Cleanse the wound with wound cleanser prior to applying a clean dressing using gauze sponges, not tissue or cotton balls. Peri-Wound Care: Sween Lotion (Moisturizing lotion) 1 x Per Week/30 Days Discharge Instructions: Apply moisturizing lotion as directed Prim Dressing: KerraCel Ag Gelling Fiber Dressing, 2x2 in (silver alginate) 1 x Per Week/30 Days ary Discharge Instructions: Apply silver alginate to wound bed as instructed Secondary Dressing: ABD Pad, 8x10 1 x Per Week/30 Days Discharge Instructions: Apply over primary dressing as directed. Compression Wrap: ThreePress (3 layer compression wrap) 1 x Per Week/30 Days Discharge Instructions: Apply three layer compression as directed. Electronic Signature(s) Signed: 09/05/2022 5:47:10 PM By: Linton Ham MD Signed: 09/06/2022 5:16:09 PM By: Rhae Hammock RN Entered By: Rhae Hammock on 09/05/2022 09:19:15 -------------------------------------------------------------------------------- Problem List Details Patient Name: Date of Service: [name], [name] RO [name] 09/05/2022 8:45 A M Medical Record Number: [id number] Patient Account Number: [id number] Date of Birth/Sex: Treating RN: Apr 14, 1941 (81 y.o. F) Primary Care Provider: Carolann Littler Other Clinician: Referring Provider: Treating Provider/Extender: Jettie Pagan in Treatment:  4 Active Problems ICD-10 Encounter Code Description Active Date MDM Diagnosis [phone] Non-pressure chronic ulcer of other part of left lower leg with fat layer exposed11/14/2023 No Yes [name], [name] ([id number]) [id number] Page 4 of 7 [phone] Non-pressure chronic ulcer of left heel and midfoot with fat layer exposed 08/07/2022 No Yes G63 Polyneuropathy in diseases classified elsewhere 08/07/2022 No Yes N18.2 Chronic kidney disease, stage 2 (mild) 08/07/2022 No Yes M06.9 Rheumatoid arthritis, unspecified 08/07/2022 No Yes I10 Essential (primary) hypertension 08/07/2022 No Yes Z79.52 Long term (current) use of systemic steroids 08/07/2022 No Yes Inactive Problems Resolved Problems Electronic Signature(s) Signed: 09/05/2022 5:47:10 PM By: Linton Ham MD Entered By: Linton Ham on 09/05/2022 09:12:47 -------------------------------------------------------------------------------- Progress Note Details Patient Name: Date of Service: [name], [name] RO [name] 09/05/2022 8:45 A M Medical Record Number: [id number] Patient Account Number: [id number] Date of Birth/Sex: Treating RN: 28-Jun-1941 (81 y.o. F) Primary Care Provider: Carolann Littler Other Clinician: Referring Provider: Treating Provider/Extender: Jettie Pagan in Treatment: 4 Subjective History of Present Illness (HPI) ADMISSION 11.14.2023 This is an 81 year old woman on chronic steroids and anticoagulants who fell near the beginning of October. She initially developed a hematoma on her left lower leg which subsequently ruptured. She presented to the emergency department on November 6 with concern for a wound infection due to developing erythema and warmth. The wound was debrided in the ED and clindamycin prescribed along with a recommendation for probiotics. Wet-to-dry dressings were prescribed, as well. She was seen again in the emergency room 3 days later for other  symptoms. Photographs in the electronic medical record show that the wound is quite a bit cleaner. Wound care was consulted at that time. They recommended daily soap and water cleanse and Xeroform topped with an ABD and a Kerlix and Ace wrap. Upon her discharge from the hospital, she was referred to the wound care center for further evaluation and management. ABI in clinic was 1.25. On intake, she was also noted to have an ulcer on her left midfoot associated with a fallen arch and callus. On her left anterior tibial surface, there is a large ovoid wound with some evidence of epithelialization. There  is granulation tissue and slough present. No concern for infection. On the left midfoot, there is a large callus, in the center of which there is an ulceration exposing the fat layer. No malodor or purulent drainage. 08/14/2022: The left anterior tibial wound is smaller and cleaner. There is more epithelialization present. There is some slough accumulation on the surface. When her dressing was removed from the midfoot wound, pus drained out. The patient did say she had been having quite a bit more pain in her foot during the week. 08/21/2022: The culture that I took last week grew out very low levels of just some skin flora. The patient filled but did not take the antibiotic that I prescribed, as her primary care doctor put her on doxycycline for an upper respiratory infection. The anterior tibial wound is looking good. There is robust granulation tissue with a light layer of slough on the surface. The plantar midfoot wound has accumulated a thick callus once again. The opening in the skin is quite small. No purulent drainage present. [name], [name] ([id number]) [id number] Page 5 of 7 08/29/2022: The anterior tibial wound continues to contract. There is minimal slough and there has been greater epithelialization. She does have a bit of hypertrophic granulation tissue  present. The plantar midfoot wound is once again covered and callus. The opening in the skin is basically unchanged. No concern for infection. 12/13; thick eschar on the anterior tibial wound. The wound on her left foot has healed. This is in the medial part of the foot. She has thick hemorrhagic callus but no open wound in this area. Objective Constitutional Sitting or standing Blood Pressure is within target range for patient.. Pulse regular and within target range for patient.Marland Kitchen Respirations regular, non-labored and within target range.. Temperature is normal and within the target range for the patient.Marland Kitchen Appears in no distress. Vitals Time Taken: 8:20 AM, Height: 61 in, Weight: 128 lbs, BMI: 24.2, Temperature: 98.0 F, Pulse: 76 bpm, Respiratory Rate: 20 breaths/min, Blood Pressure: 132/77 mmHg. General Notes: Wound exam; the anterior tibia wound required removal of the surface eschar. Underneath this is contracted nicely she has 1 small open area but it as healthy looking granulation no further debridement was required. Her edema control is good On the plantar foot there is no open wound however she has thick hemorrhagic callus. This would put her at risk for recurrent wounds in this area. She has been to see podiatry about this Integumentary (Hair, Skin) Wound #6 status is Open. Original cause of wound was Trauma. The date acquired was: 07/08/2022. The wound has been in treatment 4 weeks. The wound is located on the Left,Anterior Lower Leg. The wound measures 2cm length x 3cm width x 0.1cm depth; 4.712cm^2 area and 0.471cm^3 volume. There is Fat Layer (Subcutaneous Tissue) exposed. There is no tunneling or undermining noted. There is a medium amount of serosanguineous drainage noted. The wound margin is flat and intact. There is large (67-100%) pink granulation within the wound bed. There is a small (1-33%) amount of necrotic tissue within the wound bed including Adherent Slough. The periwound  skin appearance had no abnormalities noted for texture. The periwound skin appearance had no abnormalities noted for moisture. The periwound skin appearance exhibited: Hemosiderin Staining. The periwound skin appearance did not exhibit: Atrophie Blanche, Cyanosis, Ecchymosis, Mottled, Pallor, Rubor, Erythema. Periwound temperature was noted as No Abnormality. The periwound has tenderness on palpation. Wound #7 status is Healed - Epithelialized. Original cause of wound was Gradually Appeared.  The date acquired was: 06/24/2022. The wound has been in treatment 4 weeks. The wound is located on the [name]. The wound measures 0cm length x 0cm width x 0cm depth; 0cm^2 area and 0cm^3 volume. There is Fat Layer (Subcutaneous Tissue) exposed. There is no tunneling or undermining noted. There is a small amount of serosanguineous drainage noted. The wound margin is thickened. There is no granulation within the wound bed. There is no necrotic tissue within the wound bed. The periwound skin appearance had no abnormalities noted for moisture. The periwound skin appearance had no abnormalities noted for color. The periwound skin appearance exhibited: Callus. Periwound temperature was noted as No Abnormality. The periwound has tenderness on palpation. 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 There was a Selective/Open Wound Skin/Epidermis Debridement with a total area of 6 sq cm  performed by Ricard Dillon., MD. With the following instrument(s): Curette to remove Viable and Non-Viable tissue/material. Material removed includes Slough, Skin: Dermis, and Skin: Epidermis after achieving pain control using Lidocaine. No specimens were taken. A time out was conducted at 09:00, prior to the start of the procedure. A Minimum amount of bleeding was controlled with Pressure. The procedure was tolerated well with a pain level of 0 throughout and a pain level of 0 following the procedure. Post Debridement Measurements: 2cm length x 3cm width x 0.1cm depth; 0.471cm^3 volume. Character of Wound/Ulcer Post Debridement is improved. Severity of Tissue Post Debridement is: Fat layer exposed. Post procedure Diagnosis Wound #6: Same as Pre-Procedure Pre-procedure diagnosis of Wound #6 is a Venous Leg Ulcer located on the Left,Anterior Lower Leg . There was a Three Layer Compression Therapy Procedure by Rhae Hammock, RN. Post procedure Diagnosis Wound #6: Same as Pre-Procedure [name], [name] ([id number]) [id number] Page 6 of 7 Plan Follow-up Appointments: Return Appointment in 1 week. - Dr. Celine Ahr Anesthetic: Wound #6 Left,Anterior Lower Leg: (In clinic) Topical Lidocaine 4% applied to wound bed Bathing/ Shower/ Hygiene: May shower with protection but do not get wound dressing(s) wet. - may be purchased at [facility] or [facility] Edema Control - Lymphedema / SCD / Other: Elevate legs to the level of the heart or above for 30 minutes daily and/or when sitting, a frequency of: - throughout the day while sitting Avoid standing for long periods of time. Patient to wear own compression stockings every day. - right leg Exercise regularly WOUND #6: - Lower Leg Wound Laterality: Left, Anterior Cleanser: Soap and Water 1 x Per Week/30 Days Discharge Instructions: May shower and wash wound with dial antibacterial soap and water prior to dressing  change. Cleanser: Wound Cleanser 1 x Per Week/30 Days Discharge Instructions: Cleanse the wound with wound cleanser prior to applying a clean dressing using gauze sponges, not tissue or cotton balls. Peri-Wound Care: Sween Lotion (Moisturizing lotion) 1 x Per Week/30 Days Discharge Instructions: Apply moisturizing lotion as directed Prim Dressing: KerraCel Ag Gelling Fiber Dressing, 2x2 in (silver alginate) 1 x Per Week/30 Days ary Discharge Instructions: Apply silver alginate to wound bed as instructed Secondary Dressing: ABD Pad, 8x10  1 x Per Week/30 Days Discharge Instructions: Apply over primary dressing as directed. Com pression Wrap: ThreePress (3 layer compression wrap) 1 x Per Week/30 Days Discharge Instructions: Apply three layer compression as directed. 1. We continued with the silver alginate on the remaining wound on the left anterior lower leg. The wound on the left foot is technically healed although this area is going to be at risk because of the foot deformity and recurrent callus. The patient tells me that her podiatrist told her the only thing that could be done is foot surgery to pare down the bone in this area and hopefully relieve some of the pressure. I do not disagree with this however for now she is simply going to have to pad this area to prevent pressure and friction as best she can 2. She is going away next week for the holidays to [place]. I asked her to take off her own wrap in a week and we will give her some Tubigrip and some silver alginate to apply hopefully this will not cause deterioration Electronic Signature(s) Signed: 09/05/2022 5:47:10 PM By: Linton Ham MD Entered By: Linton Ham on 09/05/2022 09:17:58 -------------------------------------------------------------------------------- SuperBill Details Patient Name: Date of Service: [name], [name] RO [name] 09/05/2022 Medical Record Number: [id number] Patient Account Number: [id number] Date  of Birth/Sex: Treating RN: 1940/10/02 (81 y.o. [name], [name] Primary Care Provider: Carolann Littler Other Clinician: Referring Provider: Treating Provider/Extender: Jettie Pagan in Treatment: 4 Diagnosis Coding ICD-10 Codes Code Description [phone] Non-pressure chronic ulcer of other part of left lower leg with fat layer exposed L97.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 G63 Polyneuropathy in diseases classified elsewhere N18.2 Chronic kidney disease, stage 2 (mild) M06.9 Rheumatoid arthritis, unspecified I10 Essential (primary) hypertension Z79.52 Long term (current) use of systemic steroids Facility Procedures : [name], [name] Code: [phone] TANNA LOEFFLER ([id number] Description: [phone] - DEBRIDE WOUND 1ST 20 SQ CM OR < ICD-10 Diagnosis Description L97.822 Non-pressure chronic ulcer of other part of left lower leg with fat layer exposed 8) [id number] Modifier: [id number] Quantity: 1 Page 7 of 7 Physician Procedures : CPT4 Code Description Modifier [id number] - WC PHYS DEBR WO ANESTH 20 SQ CM ICD-10 Diagnosis Description L97.822 Non-pressure chronic ulcer of other part of left lower leg with fat layer exposed Quantity: 1 Electronic Signature(s) Signed: 09/05/2022 5:47:10 PM By: Linton Ham MD Entered By: Linton Ham on 09/05/2022 09:18:17

## 2022-09-06 NOTE — Progress Notes (Signed)
[name], [name] ([id number]) [id number] Page 1 of 10 Visit Report for 09/05/2022 Arrival Information Details Patient Name: Date of Service: [name], [name] 09/05/2022 8:45 A M Medical Record Number: [id number] Patient Account Number: [id number] Date of Birth/Sex: Treating RN: March 25, 1941 (81 y.o. F) Primary Care Teliyah Royal: Carolann Littler Other Clinician: Referring Taino Maertens: Treating Tarahji Ramthun/Extender: Jettie Pagan in Treatment: 4 Visit Information History Since Last Visit All ordered tests and consults were completed: No Patient Arrived: [name] Added or deleted any medications: No Arrival Time: 08:24 Any new allergies or adverse reactions: No Accompanied By: self Had a fall or experienced change in No Transfer Assistance: None activities of daily living that may affect Patient Identification Verified: Yes risk of falls: Secondary Verification Process Completed: Yes Signs or symptoms of abuse/neglect since last visito No Patient Requires Transmission-Based Precautions: No Hospitalized since last visit: No Patient Has Alerts: No Implantable device outside of the clinic excluding No cellular tissue based products placed in the center since last visit: Pain Present Now: No Electronic Signature(s) Signed: 09/05/2022 8:43:05 AM By: Worthy Rancher Entered By: Worthy Rancher on 09/05/2022 08:24:28 -------------------------------------------------------------------------------- Compression Therapy Details Patient Name: Date of Service: [name], [place] RO [name] 09/05/2022 8:45 A M Medical Record Number: [id number] Patient Account Number: [id number] Date of Birth/Sex: Treating RN: Feb 13, 1941 (81 y.o. Tonita Phoenix, Lauren Primary Care Tamzin Bertling: Carolann Littler Other Clinician: Referring Chane Magner: Treating Leno Mathes/Extender: Jettie Pagan in Treatment: 4 Compression Therapy Performed for Wound Assessment:  Wound #6 Left,Anterior Lower Leg Performed By: Clinician Rhae Hammock, RN Compression Type: Three Layer Post Procedure Diagnosis Same as Pre-procedure Electronic Signature(s) Signed: 09/06/2022 5:16:09 PM By: Rhae Hammock RN Entered By: Rhae Hammock on 09/05/2022 09:03:46 [name] ([id number]) [id number] Page 2 of 10 -------------------------------------------------------------------------------- Encounter Discharge Information Details Patient Name: Date of Service: [name] RO [name] 09/05/2022 8:45 A M Medical Record Number: [id number] Patient Account Number: [id number] Date of Birth/Sex: Treating RN: November 21, 1940 (81 y.o. Tonita Phoenix, Lauren Primary Care Luz Mares: Carolann Littler Other Clinician: Referring Josh Nicolosi: Treating Maki Sweetser/Extender: Jettie Pagan in Treatment: 4 Encounter Discharge Information Items Post Procedure Vitals Discharge Condition: Stable Temperature (F): 98.7 Ambulatory Status: Ambulatory Pulse (bpm): 74 Discharge Destination: Home Respiratory Rate (breaths/min): 17 Transportation: Private Auto Blood Pressure (mmHg): 120/70 Accompanied By: self Schedule Follow-up Appointment: Yes Clinical Summary of Care: Patient Declined Electronic Signature(s) Signed: 09/06/2022 5:16:09 PM By: Rhae Hammock RN Entered By: Rhae Hammock on 09/05/2022 09:03:24 -------------------------------------------------------------------------------- Lower Extremity Assessment Details Patient Name: Date of Service: [name] RO [name] 09/05/2022 8:45 A M Medical Record Number: [id number] Patient Account Number: [id number] Date of Birth/Sex: Treating RN: 1940-12-20 (81 y.o. [name] Primary Care Rizwan Kuyper: Carolann Littler Other Clinician: Referring Felisa Zechman: Treating Antigone Crowell/Extender: Jettie Pagan in Treatment: 4 Edema Assessment Assessed: Shirlyn Goltz: No]  Patrice Paradise: No] Edema: [Left: Ye] [Right: s] Calf Left: Right: Point of Measurement: From Medial Instep 32.5 cm Ankle Left: Right: Point of Measurement: From Medial Instep 21.5 cm Vascular Assessment Pulses: Dorsalis Pedis Palpable: [Left:Yes] Electronic Signature(s) Signed: 09/05/2022 10:44:48 AM By: Dellie Catholic RN Entered By: Dellie Catholic on 09/05/2022 08:38:08 Multi Wound Chart Details -------------------------------------------------------------------------------- [name] ([id number]) [id number] Page 3 of 10 Patient Name: Date of Service: [name], [name] 09/05/2022 8:45 A M Medical Record Number: [id number] Patient Account Number: [id number] Date of Birth/Sex: Treating RN: 02-Aug-1941 (81 y.o. F) Primary Care Roxi Hlavaty: Carolann Littler  Other Clinician: Referring Pete Merten: Treating Shantoria Ellwood/Extender: Jettie Pagan in Treatment: 4 Vital Signs Height(in): 61 Pulse(bpm): 22 Weight(lbs): 128 Blood Pressure(mmHg): 132/77 Body Mass Index(BMI): 24.2 Temperature(F): 98.0 Respiratory Rate(breaths/min): 20 Wound Assessments Wound Number: 6 7 N/A Photos: N/A Left, Anterior Lower Leg Left, Plantar Foot N/A Wound Location: Trauma Gradually Appeared N/A Wounding Event: Venous Leg Ulcer Neuropathic Ulcer-Non Diabetic N/A Primary Etiology: Cataracts, Hypertension, Rheumatoid Cataracts, Hypertension, Rheumatoid N/A Comorbid History: Arthritis, Seizure Disorder Arthritis, Seizure Disorder 07/08/2022 06/24/2022 N/A Date Acquired: 4 4 N/A Weeks of Treatment: Open Healed - Epithelialized N/A Wound Status: No No N/A Wound Recurrence: 2x3x0.1 0x0x0 N/A Measurements L x W x D (cm) 4.712 0 N/A A (cm) : rea 0.471 0 N/A Volume (cm) : 85.60% 100.00% N/A % Reduction in A rea: 85.60% 100.00% N/A % Reduction in Volume: Full Thickness Without Exposed Full Thickness Without Exposed  N/A Classification: Support Structures Support Structures Medium Small N/A Exudate A mount: Serosanguineous Serosanguineous N/A Exudate Type: red, [name] red, [name] N/A Exudate Color: Flat and Intact Thickened N/A Wound Margin: Large (67-100%) None Present (0%) N/A Granulation A mount: Pink N/A N/A Granulation Quality: Small (1-33%) None Present (0%) N/A Necrotic A mount: Fat Layer (Subcutaneous Tissue): Yes Fat Layer (Subcutaneous Tissue): Yes N/A Exposed Structures: Fascia: No Fascia: No Tendon: No Tendon: No Muscle: No Muscle: No Joint: No Joint: No Bone: No Bone: No Small (1-33%) None N/A Epithelialization: Debridement - Selective/Open Wound N/A N/A Debridement: Pre-procedure Verification/Time Out 09:00 N/A N/A Taken: Lidocaine N/A N/A Pain Control: Slough N/A N/A Tissue Debrided: Skin/Epidermis N/A N/A Level: 6 N/A N/A Debridement A (sq cm): rea Curette N/A N/A Instrument: Minimum N/A N/A Bleeding: Pressure N/A N/A Hemostasis A chieved: 0 N/A N/A Procedural Pain: 0 N/A N/A Post Procedural Pain: Procedure was tolerated well N/A N/A Debridement Treatment Response: 2x3x0.1 N/A N/A Post Debridement Measurements L x W x D (cm) 0.471 N/A N/A Post Debridement Volume: (cm) Excoriation: No Callus: Yes N/A Periwound Skin Texture: Induration: No Callus: No Crepitus: No Rash: No Scarring: No Maceration: No No Abnormalities Noted N/A Periwound Skin Moisture: Dry/Scaly: No Hemosiderin Staining: Yes No Abnormalities Noted N/A Periwound Skin Color: Atrophie Blanche: No Cyanosis: No ZABRINA, BROTHERTON ([id number]) [id number] Page 4 of 10 Ecchymosis: No Erythema: No Mottled: No Pallor: No Rubor: No No Abnormality No Abnormality N/A Temperature: Yes Yes N/A Tenderness on Palpation: Compression Therapy N/A N/A Procedures Performed: Debridement Treatment Notes Wound #6 (Lower Leg) Wound Laterality: Left,  Anterior Cleanser Soap and Water Discharge Instruction: May shower and wash wound with dial antibacterial soap and water prior to dressing change. Wound Cleanser Discharge Instruction: Cleanse the wound with wound cleanser prior to applying a clean dressing using gauze sponges, not tissue or cotton balls. Peri-Wound Care Sween Lotion (Moisturizing lotion) Discharge Instruction: Apply moisturizing lotion as directed Topical Primary Dressing KerraCel Ag Gelling Fiber Dressing, 2x2 in (silver alginate) Discharge Instruction: Apply silver alginate to wound bed as instructed Secondary Dressing ABD Pad, 8x10 Discharge Instruction: Apply over primary dressing as directed. Secured With Compression Wrap ThreePress (3 layer compression wrap) Discharge Instruction: Apply three layer compression as directed. Compression Stockings Add-Ons Wound #7 (Foot) Wound Laterality: Plantar, Left Cleanser Peri-Wound Care Topical Primary Dressing Secondary Dressing Secured With Compression Wrap Compression Stockings Add-Ons Electronic Signature(s) Signed: 09/05/2022 5:47:10 PM By: Linton Ham MD Entered By: Linton Ham on 09/05/2022 09:12:55 [place] Details -------------------------------------------------------------------------------- [name] ([id number]) [id number] Page 5 of 10 Patient Name: Date of Service: FA RLO  W, [place] RO [name] 09/05/2022 8:45 A M Medical Record Number: [id number] Patient Account Number: [id number] Date of Birth/Sex: Treating RN: [birth year] (81 y.o. Tonita Phoenix, Lauren Primary Care Ariell Gunnels: Carolann Littler Other Clinician: Referring Latera Mclin: Treating Ludie Hudon/Extender: Jettie Pagan in Treatment: 4 Multidisciplinary Care Plan reviewed with physician Active Inactive Venous Leg Ulcer Nursing Diagnoses: Knowledge deficit related to disease process and management Potential for  venous Insuffiency (use before diagnosis confirmed) Goals: Patient will maintain optimal edema control Date Initiated: 08/07/2022 Target Resolution Date: 10/24/2022 Goal Status: Active Interventions: Assess peripheral edema status every visit. Compression as ordered Provide education on venous insufficiency Treatment Activities: Therapeutic compression applied : 08/07/2022 Notes: Wound/Skin Impairment Nursing Diagnoses: Impaired tissue integrity Knowledge deficit related to ulceration/compromised skin integrity Goals: Patient/caregiver will verbalize understanding of skin care regimen Date Initiated: 08/07/2022 Target Resolution Date: 10/24/2022 Goal Status: Active Ulcer/skin breakdown will have a volume reduction of 30% by week 4 Date Initiated: 08/07/2022 Target Resolution Date: 10/24/2022 Goal Status: Active Interventions: Assess patient/caregiver ability to obtain necessary supplies Assess patient/caregiver ability to perform ulcer/skin care regimen upon admission and as needed Assess ulceration(s) every visit Provide education on ulcer and skin care Treatment Activities: Skin care regimen initiated : 08/07/2022 Topical wound management initiated : 08/07/2022 Notes: Electronic Signature(s) Signed: 09/06/2022 5:16:09 PM By: Rhae Hammock RN Entered By: Rhae Hammock on 09/05/2022 09:02:25 -------------------------------------------------------------------------------- Pain Assessment Details Patient Name: Date of Service: [name], [place] RO [name] 09/05/2022 8:45 A M Medical Record Number: [id number] Patient Account Number: [id number] JULENE, RAHN ([id number]) [id number] Page 6 of 10 Date of Birth/Sex: Treating RN: 11-Nov-1940 (81 y.o. F) Primary Care Oumar Marcott: Carolann Littler Other Clinician: Referring Lashana Spang: Treating Irelynd Zumstein/Extender: Jettie Pagan in Treatment: 4 Active Problems Location of Pain  Severity and Description of Pain Patient Has Paino No Site Locations Pain Management and Medication Current Pain Management: Electronic Signature(s) Signed: 09/05/2022 8:43:05 AM By: Worthy Rancher Entered By: Worthy Rancher on 09/05/2022 08:25:07 -------------------------------------------------------------------------------- Patient/Caregiver Education Details Patient Name: Date of Service: [name] RO [name] 12/13/2023andnbsp8:45 A M Medical Record Number: [id number] Patient Account Number: [id number] Date of Birth/Gender: Treating RN: [birth year] (81 y.o. Benjaman Lobe Primary Care Physician: Carolann Littler Other Clinician: Referring Physician: Treating Physician/Extender: Jettie Pagan in Treatment: 4 Education Assessment Education Provided To: Patient Education Topics Provided Venous: Methods: Explain/Verbal Responses: Reinforcements needed, State content correctly Electronic Signature(s) Signed: 09/06/2022 5:16:09 PM By: Rhae Hammock RN Entered By: Rhae Hammock on 09/05/2022 09:02:36 [name] ([id number]) [id number] Page 7 of 10 -------------------------------------------------------------------------------- Wound Assessment Details Patient Name: Date of Service: [name], [name] 09/05/2022 8:45 A M Medical Record Number: [id number] Patient Account Number: [id number] Date of Birth/Sex: Treating RN: 03/07/1941 (81 y.o. [name] Primary Care Tiaria Biby: Carolann Littler Other Clinician: Referring Louie Meaders: Treating Lucero Auzenne/Extender: Jettie Pagan in Treatment: 4 Wound Status Wound Number: 6 Primary Venous Leg Ulcer Etiology: Wound Location: Left, Anterior Lower Leg Wound Status: Open Wounding Event: Trauma Comorbid Cataracts, Hypertension, Rheumatoid Arthritis, Seizure Date Acquired: 07/08/2022 History: Disorder Weeks Of Treatment: 4 Clustered  Wound: No Photos Wound Measurements Length: (cm) 2 Width: (cm) 3 Depth: (cm) 0.1 Area: (cm) 4.712 Volume: (cm) 0.471 % Reduction in Area: 85.6% % Reduction in Volume: 85.6% Epithelialization: Small (1-33%) Tunneling: No Undermining: No Wound Description Classification: Full Thickness Without Exposed Support Wound Margin: Flat and Intact Exudate Amount: Medium Exudate Type: Serosanguineous Exudate Color: red, [name] Structures  Foul Odor After Cleansing: No Slough/Fibrino No Wound Bed Granulation Amount: Large (67-100%) Exposed Structure Granulation Quality: Pink Fascia Exposed: No Necrotic Amount: Small (1-33%) Fat Layer (Subcutaneous Tissue) Exposed: Yes Necrotic Quality: Adherent Slough Tendon Exposed: No Muscle Exposed: No Joint Exposed: No Bone Exposed: No Periwound Skin Texture Texture Color No Abnormalities Noted: Yes No Abnormalities Noted: No Atrophie Blanche: No Moisture Cyanosis: No No Abnormalities Noted: Yes Ecchymosis: No Erythema: No Hemosiderin Staining: Yes Mottled: No Pallor: No Rubor: No Temperature / Pain Temperature: No Abnormality Tenderness on Palpation: Yes JENELLE, DRENNON ([id number]) [id number] Page 8 of 10 Treatment Notes Wound #6 (Lower Leg) Wound Laterality: Left, Anterior Cleanser Soap and Water Discharge Instruction: May shower and wash wound with dial antibacterial soap and water prior to dressing change. Wound Cleanser Discharge Instruction: Cleanse the wound with wound cleanser prior to applying a clean dressing using gauze sponges, not tissue or cotton balls. Peri-Wound Care Sween Lotion (Moisturizing lotion) Discharge Instruction: Apply moisturizing lotion as directed Topical Primary Dressing KerraCel Ag Gelling Fiber Dressing, 2x2 in (silver alginate) Discharge Instruction: Apply silver alginate to wound bed as instructed Secondary Dressing ABD Pad, 8x10 Discharge Instruction: Apply over  primary dressing as directed. Secured With Compression Wrap ThreePress (3 layer compression wrap) Discharge Instruction: Apply three layer compression as directed. Compression Stockings Add-Ons Electronic Signature(s) Signed: 09/05/2022 10:44:48 AM By: Dellie Catholic RN Entered By: Dellie Catholic on 09/05/2022 08:42:57 -------------------------------------------------------------------------------- Wound Assessment Details Patient Name: Date of Service: [name], [place] RO [name] 09/05/2022 8:45 A M Medical Record Number: [id number] Patient Account Number: [id number] Date of Birth/Sex: Treating RN: 09/28/1940 (81 y.o. Tonita Phoenix, Lauren Primary Care Ranetta Armacost: Carolann Littler Other Clinician: Referring Daisean Brodhead: Treating Jessen Siegman/Extender: Jettie Pagan in Treatment: 4 Wound Status Wound Number: 7 Primary Neuropathic Ulcer-Non Diabetic Etiology: Wound Location: Left, Plantar Foot Wound Status: Healed - Epithelialized Wounding Event: Gradually Appeared Comorbid Cataracts, Hypertension, Rheumatoid Arthritis, Seizure Date Acquired: 06/24/2022 History: Disorder Weeks Of Treatment: 4 Clustered Wound: No Photos DEMONICA, FARREY ([id number]) [id number] Page 9 of 10 Wound Measurements Length: (cm) Width: (cm) Depth: (cm) Area: (cm) Volume: (cm) 0 % Reduction in Area: 100% 0 % Reduction in Volume: 100% 0 Epithelialization: None 0 Tunneling: No 0 Undermining: No Wound Description Classification: Full Thickness Without Exposed Support Wound Margin: Thickened Exudate Amount: Small Exudate Type: Serosanguineous Exudate Color: red, [name] Structures Foul Odor After Cleansing: No Slough/Fibrino Yes Wound Bed Granulation Amount: None Present (0%) Exposed Structure Necrotic Amount: None Present (0%) Fascia Exposed: No Fat Layer (Subcutaneous Tissue) Exposed: Yes Tendon Exposed: No Muscle Exposed: No Joint Exposed: No Bone  Exposed: No Periwound Skin Texture Texture Color No Abnormalities Noted: No No Abnormalities Noted: Yes Callus: Yes Temperature / Pain Temperature: No Abnormality Moisture No Abnormalities Noted: Yes Tenderness on Palpation: Yes Treatment Notes Wound #7 (Foot) Wound Laterality: Plantar, Left Cleanser Peri-Wound Care Topical Primary Dressing Secondary Dressing Secured With Compression Wrap Compression Stockings Add-Ons Electronic Signature(s) Signed: 09/06/2022 5:16:09 PM By: Rhae Hammock RN Entered By: Rhae Hammock on 09/05/2022 09:00:12 [name] ([id number]) [id number] Page 10 of 10 -------------------------------------------------------------------------------- Vitals Details Patient Name: Date of Service: [name], [name] 09/05/2022 8:45 A M Medical Record Number: [id number] Patient Account Number: [id number] Date of Birth/Sex: Treating RN: 08/20/1941 (81 y.o. F) Primary Care Therin Vetsch: Carolann Littler Other Clinician: Referring Barry Culverhouse: Treating Daelan Gatt/Extender: Jettie Pagan in Treatment: 4 Vital Signs Time Taken: 08:20 Temperature (F): 98.0 Height (in): 61 Pulse (bpm): 76  Weight (lbs): 128 Respiratory Rate (breaths/min): 20 Body Mass Index (BMI): 24.2 Blood Pressure (mmHg): 132/77 Reference Range: 80 - 120 mg / dl Electronic Signature(s) Signed: 09/05/2022 8:43:05 AM By: Worthy Rancher Entered By: Worthy Rancher on 09/05/2022 08:24:58

## 2022-09-07 DIAGNOSIS — Z7952 Long term (current) use of systemic steroids: Secondary | ICD-10-CM | POA: Diagnosis not present

## 2022-09-07 DIAGNOSIS — G3184 Mild cognitive impairment, so stated: Secondary | ICD-10-CM | POA: Diagnosis not present

## 2022-09-07 DIAGNOSIS — M069 Rheumatoid arthritis, unspecified: Secondary | ICD-10-CM | POA: Diagnosis not present

## 2022-09-07 DIAGNOSIS — Z9181 History of falling: Secondary | ICD-10-CM | POA: Diagnosis not present

## 2022-09-07 DIAGNOSIS — M810 Age-related osteoporosis without current pathological fracture: Secondary | ICD-10-CM | POA: Diagnosis not present

## 2022-09-07 DIAGNOSIS — L97822 Non-pressure chronic ulcer of other part of left lower leg with fat layer exposed: Secondary | ICD-10-CM | POA: Diagnosis not present

## 2022-09-07 DIAGNOSIS — K219 Gastro-esophageal reflux disease without esophagitis: Secondary | ICD-10-CM | POA: Diagnosis not present

## 2022-09-07 DIAGNOSIS — E559 Vitamin D deficiency, unspecified: Secondary | ICD-10-CM | POA: Diagnosis not present

## 2022-09-07 DIAGNOSIS — M315 Giant cell arteritis with polymyalgia rheumatica: Secondary | ICD-10-CM | POA: Diagnosis not present

## 2022-09-07 DIAGNOSIS — L97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: Secondary | ICD-10-CM | POA: Diagnosis not present

## 2022-09-07 DIAGNOSIS — G40909 Epilepsy, unspecified, not intractable, without status epilepticus: Secondary | ICD-10-CM | POA: Diagnosis not present

## 2022-09-07 DIAGNOSIS — G43909 Migraine, unspecified, not intractable, without status migrainosus: Secondary | ICD-10-CM | POA: Diagnosis not present

## 2022-09-07 DIAGNOSIS — I129 Hypertensive chronic kidney disease with stage 1 through stage 4 chronic kidney disease, or unspecified chronic kidney disease: Secondary | ICD-10-CM | POA: Diagnosis not present

## 2022-09-07 DIAGNOSIS — M1712 Unilateral primary osteoarthritis, left knee: Secondary | ICD-10-CM | POA: Diagnosis not present

## 2022-09-07 DIAGNOSIS — E1142 Type 2 diabetes mellitus with diabetic polyneuropathy: Secondary | ICD-10-CM | POA: Diagnosis not present

## 2022-09-07 DIAGNOSIS — F419 Anxiety disorder, unspecified: Secondary | ICD-10-CM | POA: Diagnosis not present

## 2022-09-07 DIAGNOSIS — K5790 Diverticulosis of intestine, part unspecified, without perforation or abscess without bleeding: Secondary | ICD-10-CM | POA: Diagnosis not present

## 2022-09-07 DIAGNOSIS — J84112 Idiopathic pulmonary fibrosis: Secondary | ICD-10-CM | POA: Diagnosis not present

## 2022-09-07 DIAGNOSIS — E1122 Type 2 diabetes mellitus with diabetic chronic kidney disease: Secondary | ICD-10-CM | POA: Diagnosis not present

## 2022-09-07 DIAGNOSIS — N182 Chronic kidney disease, stage 2 (mild): Secondary | ICD-10-CM | POA: Diagnosis not present

## 2022-09-07 DIAGNOSIS — Z79891 Long term (current) use of opiate analgesic: Secondary | ICD-10-CM | POA: Diagnosis not present

## 2022-09-07 DIAGNOSIS — E785 Hyperlipidemia, unspecified: Secondary | ICD-10-CM | POA: Diagnosis not present

## 2022-09-07 DIAGNOSIS — I700 Atherosclerosis of aorta: Secondary | ICD-10-CM | POA: Diagnosis not present

## 2022-09-07 DIAGNOSIS — M4712 Other spondylosis with myelopathy, cervical region: Secondary | ICD-10-CM | POA: Diagnosis not present

## 2022-09-07 DIAGNOSIS — H5461 Unqualified visual loss, right eye, normal vision left eye: Secondary | ICD-10-CM | POA: Diagnosis not present

## 2022-09-11 DIAGNOSIS — L97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: Secondary | ICD-10-CM | POA: Diagnosis not present

## 2022-09-11 DIAGNOSIS — L97822 Non-pressure chronic ulcer of other part of left lower leg with fat layer exposed: Secondary | ICD-10-CM | POA: Diagnosis not present

## 2022-09-11 DIAGNOSIS — M315 Giant cell arteritis with polymyalgia rheumatica: Secondary | ICD-10-CM | POA: Diagnosis not present

## 2022-09-11 DIAGNOSIS — M069 Rheumatoid arthritis, unspecified: Secondary | ICD-10-CM | POA: Diagnosis not present

## 2022-09-11 DIAGNOSIS — E1142 Type 2 diabetes mellitus with diabetic polyneuropathy: Secondary | ICD-10-CM | POA: Diagnosis not present

## 2022-09-11 DIAGNOSIS — J84112 Idiopathic pulmonary fibrosis: Secondary | ICD-10-CM | POA: Diagnosis not present

## 2022-09-12 ENCOUNTER — Ambulatory Visit (INDEPENDENT_AMBULATORY_CARE_PROVIDER_SITE_OTHER): Payer: Medicare Other | Admitting: Neurology

## 2022-09-12 DIAGNOSIS — R410 Disorientation, unspecified: Secondary | ICD-10-CM | POA: Diagnosis not present

## 2022-09-14 ENCOUNTER — Other Ambulatory Visit: Payer: Self-pay | Admitting: Family Medicine

## 2022-09-14 NOTE — Procedures (Signed)
   HISTORY: 81 year old female presented with transient loss of consciousness  TECHNIQUE:  This is a routine 16 channel EEG recording with one channel devoted to a limited EKG recording.  It was performed during wakefulness, drowsiness and asleep.  Photic stimulation were performed as activating procedures.  There are minimum muscle and movement artifact noted.  Upon maximum arousal, posterior dominant waking rhythm consistent of rhythmic alpha range activity. Activities are symmetric over the bilateral posterior derivations and attenuated with eye opening.  Hyperventilation was not performed due to age  Photic stimulation did not alter the tracing.  During EEG recording, patient developed drowsiness and no deeper stage of sleep was achieved.  During EEG recording, there was no epileptiform discharge noted.  EKG demonstrate sinus rhythm   CONCLUSION: This is a  normal awake EEG.  There is no electrodiagnostic evidence of epileptiform discharge.  Marcial Pacas, M.D. Ph.D.  [facility] [place], [place] [address] Phone: [phone] Fax:      [phone]

## 2022-09-25 ENCOUNTER — Telehealth: Payer: Self-pay | Admitting: *Deleted

## 2022-09-25 ENCOUNTER — Encounter: Payer: Self-pay | Admitting: *Deleted

## 2022-09-25 ENCOUNTER — Ambulatory Visit: Payer: Medicare Other | Admitting: Family Medicine

## 2022-09-25 NOTE — Patient Outreach (Signed)
  Care Coordination   Initial Visit Note   09/25/2022 Name: [name] MRN: [id number] DOB: [birth year]  [name] is a 82 y.o. year old female who sees Burchette, Alinda Sierras, MD for primary care. I spoke with  [name] by phone today.  What matters to the patients health and wellness today?  No needs    Goals Addressed             This Visit's Progress    COMPLETED: care coordination activity       Care Coordination Interventions: Reviewed medications with patient and discussed adherence with no needed refills Reviewed scheduled/upcoming provider appointments including sufficient transportation source Screening for signs and symptoms of depression related to chronic disease state  Assessed social determinant of health barriers Educated on care management services with no presented needs at this time.         SDOH assessments and interventions completed:  Yes  SDOH Interventions Today    Flowsheet Row Most Recent Value  SDOH Interventions   Food Insecurity Interventions Intervention Not Indicated  Housing Interventions Intervention Not Indicated  Transportation Interventions Intervention Not Indicated  Utilities Interventions Intervention Not Indicated        Care Coordination Interventions:  Yes, provided   Follow up plan: No further intervention required.   Encounter Outcome:  Pt. Visit Completed   Raina Mina, RN Care Management Coordinator [place] Office [phone]

## 2022-09-25 NOTE — Patient Instructions (Signed)
Visit Information  Thank you for taking time to visit with me today. Please don't hesitate to contact me if I can be of assistance to you.   Following are the goals we discussed today:   Goals Addressed             This Visit's Progress    COMPLETED: care coordination activity       Care Coordination Interventions: Reviewed medications with patient and discussed adherence with no needed refills Reviewed scheduled/upcoming provider appointments including sufficient transportation source Screening for signs and symptoms of depression related to chronic disease state  Assessed social determinant of health barriers Educated on care management services with no presented needs at this time.         Please call the care guide team at [phone] if you need to cancel or reschedule your appointment.   If you are experiencing a Mental Health or [place] or need someone to talk to, please call the Suicide and Crisis Lifeline: 988  Patient verbalizes understanding of instructions and care plan provided today and agrees to view in [place]. Active MyChart status and patient understanding of how to access instructions and care plan via MyChart confirmed with patient.     No further follow up required: No follow up needs  Raina Mina, RN Care Management Coordinator [place] Office [phone]

## 2022-09-26 ENCOUNTER — Encounter: Payer: Self-pay | Admitting: Family Medicine

## 2022-09-26 ENCOUNTER — Ambulatory Visit (INDEPENDENT_AMBULATORY_CARE_PROVIDER_SITE_OTHER): Payer: Medicare Other | Admitting: Family Medicine

## 2022-09-26 VITALS — BP 140/72 | HR 82 | Temp 98.4°F | Ht 61.0 in | Wt 132.6 lb

## 2022-09-26 DIAGNOSIS — M545 Low back pain, unspecified: Secondary | ICD-10-CM | POA: Diagnosis not present

## 2022-09-26 DIAGNOSIS — I10 Essential (primary) hypertension: Secondary | ICD-10-CM | POA: Diagnosis not present

## 2022-09-26 DIAGNOSIS — E785 Hyperlipidemia, unspecified: Secondary | ICD-10-CM | POA: Diagnosis not present

## 2022-09-26 DIAGNOSIS — G8929 Other chronic pain: Secondary | ICD-10-CM

## 2022-09-26 MED ORDER — ATORVASTATIN CALCIUM 40 MG PO TABS: 40.0000 mg | ORAL_TABLET | Freq: Every day | ORAL | 3 refills | Status: DC

## 2022-09-26 NOTE — Progress Notes (Signed)
Established Patient Office Visit  Subjective   Patient ID: [name], female    DOB: July 21, 1941  Age: 82 y.o. MRN: [id number]  Chief Complaint  Patient presents with   Follow-up   Fatigue         HPI   [name] is here for medical follow-up.  She has chronic back pain and is on low-dose OxyContin 10 mg twice daily.  She is still waiting for her mail shipment to arrive and should be arriving any day.  In reviewing PDMP this was filled on 12-27.  She had some recent glitches with getting her mail order pain meds and time.  Current back pain is fairly well-controlled on current dose of OxyContin.  Has had some constipation intermittently and no active problems currently.  We reviewed her medications.  Question of TIA back in the fall.  She is followed by neurology.  She apparently is not taking Lipitor currently.  Requesting refill.  She has hypertension treated with amlodipine 5 mg daily.  No recent orthostatic symptoms.  No headaches.  She had some peripheral edema and takes Lasix.  Weight and edema issues stable.  No orthopnea.  Past Medical History:  Diagnosis Date   Allergic rhinitis    Anemia    Anxiety    Bronchiectasis    Carpal tunnel syndrome 07/13/2015   Bilateral   CVA (cerebral infarction) 10/2007   Right thalamic    Diverticulosis of colon    Gait disorder    GERD (gastroesophageal reflux disease)    History of breast cancer    HTN (hypertension)    Hyperlipidemia    Idiopathic pulmonary fibrosis    Left knee DJD    Migraine    Osteoporosis    Peripheral neuropathy    PMR (polymyalgia rheumatica) (HCC)    Polyneuropathy in other diseases classified elsewhere ([facility]) 02/17/2013   Previous back surgery 10/09/12   Renal insufficiency    Rheumatoid arthritis (HCC)    Seizure disorder (HCC)    Spondylosis, cervical, with myelopathy 08/31/2015   C3-4 myelopathy   Temporal arteritis (HCC)    Right eye blind, on steroids per [facility]/ Dr [name]   Type II or  unspecified type diabetes mellitus without mention of complication, not stated as uncontrolled    2nd to steriods   Vitamin D deficiency    Past Surgical History:  Procedure Laterality Date   APPENDECTOMY  01/2021   CARPAL TUNNEL RELEASE Right    CATARACT EXTRACTION     OS - Summer 2010   CERVICAL FUSION  09/24/2008   4 rods and pins in place   CHOLECYSTECTOMY     COLONOSCOPY  12/15/2011   Procedure: COLONOSCOPY;  Surgeon: [name], MD;  Location: [facility];  Service: Endoscopy;  Laterality: N/A;   LUMBAR FUSION  04/24/2010   W/Mechanical fixation - [facility]   LUMBAR FUSION  09/25/2011   rods in hips (to stabilize).   MASTECTOMY     NECK SURGERY     rheumatoid nodule removal     SPINAL FUSION  07/25/2010   T10-L2 interbody fusion / [facility]     Left    reports that she has never smoked. She has never used smokeless tobacco. She reports that she does not currently use alcohol. She reports that she does not use drugs. family history includes Arthritis in her father and mother; Brain cancer in her mother; Breast cancer  in her sister; Dementia in her father; Hypertension in her father and mother; Lung cancer in her sister. Allergies  Allergen Reactions   Hydromorphone Other (See Comments)    Cognitive changes  "made me unconscious" Dilaudid    Review of Systems  Constitutional:  Negative for malaise/fatigue.  Eyes:  Negative for blurred vision.  Respiratory:  Negative for shortness of breath.   Cardiovascular:  Negative for chest pain.  Musculoskeletal:  Positive for back pain and joint pain.  Neurological:  Negative for dizziness, weakness and headaches.      Objective:     BP (!) 140/72   Pulse 82   Temp 98.4 F (36.9 C) (Oral)   Ht '5\' 1"'$  (1.549 m)   Wt 132 lb 9.6 oz (60.1 kg)   SpO2 97%   BMI 25.05 kg/m  BP Readings from Last 3 Encounters:  09/26/22 (!) 140/72  08/14/22 (!) 160/80   08/07/22 (!) 153/74   Wt Readings from Last 3 Encounters:  09/26/22 132 lb 9.6 oz (60.1 kg)  08/14/22 132 lb 3.2 oz (60 kg)  08/07/22 130 lb (59 kg)      Physical Exam Vitals reviewed.  Constitutional:      Appearance: Normal appearance.  Cardiovascular:     Rate and Rhythm: Normal rate and regular rhythm.  Pulmonary:     Effort: Pulmonary effort is normal.     Breath sounds: No wheezing.  Musculoskeletal:     Right lower leg: No edema.     Left lower leg: No edema.  Neurological:     Mental Status: She is alert.      No results found for any visits on 09/26/22.  Last CBC Lab Results  Component Value Date   WBC 8.3 08/02/2022   HGB 12.9 08/02/2022   HCT 35.6 (L) 08/02/2022   MCV 99.4 08/02/2022   MCH 36.0 (H) 08/02/2022   RDW 13.2 08/02/2022   PLT 258 69/67/8938   Last metabolic panel Lab Results  Component Value Date   GLUCOSE 103 (H) 08/02/2022   NA 141 08/02/2022   K 3.7 08/02/2022   CL 103 08/02/2022   CO2 26 08/02/2022   BUN 15 08/02/2022   CREATININE 1.29 (H) 08/02/2022   GFRNONAA 41 (L) 08/02/2022   CALCIUM 8.1 (L) 08/02/2022   PHOS 2.0 (L) 08/02/2022   PROT 7.0 08/02/2022   ALBUMIN 3.4 (L) 08/02/2022   BILITOT 0.9 08/02/2022   ALKPHOS 66 08/02/2022   AST 21 08/02/2022   ALT 14 08/02/2022   ANIONGAP 12 08/02/2022   Last lipids Lab Results  Component Value Date   CHOL 143 08/03/2022   HDL 47 08/03/2022   LDLCALC 85 08/03/2022   TRIG 56 08/03/2022   CHOLHDL 3.0 08/03/2022   Last hemoglobin A1c Lab Results  Component Value Date   HGBA1C 5.0 08/03/2022   Last thyroid functions Lab Results  Component Value Date   TSH 1.512 08/02/2022      The ASCVD Risk score (Arnett DK, et al., 2019) failed to calculate for the following reasons:   The 2019 ASCVD risk score is only valid for ages 53 to 42    Assessment & Plan:   #1 chronic neck and back pain.  Multiple prior surgeries.  Patient stable on OxyContin 10 mg twice daily.  Refills  were already sent in December and should be arriving any day.  PDMP reviewed with no concerns.  No history of medication misuse.  Continue current regimen.  Recheck in 3  months.  #2 history of hyperlipidemia.  Questionable history of TIA.  Patient not currently on Lipitor.  Refill sent.  Recheck fasting lipid and hepatic panel at follow-up in 3 months  #3 hypertension stable.  Continue amlodipine 5 mg daily.  Carolann Littler, MD

## 2022-09-27 ENCOUNTER — Encounter (HOSPITAL_BASED_OUTPATIENT_CLINIC_OR_DEPARTMENT_OTHER): Payer: Medicare Other | Attending: General Surgery | Admitting: General Surgery

## 2022-09-27 DIAGNOSIS — I129 Hypertensive chronic kidney disease with stage 1 through stage 4 chronic kidney disease, or unspecified chronic kidney disease: Secondary | ICD-10-CM | POA: Insufficient documentation

## 2022-09-27 DIAGNOSIS — N182 Chronic kidney disease, stage 2 (mild): Secondary | ICD-10-CM | POA: Diagnosis not present

## 2022-09-27 DIAGNOSIS — G6289 Other specified polyneuropathies: Secondary | ICD-10-CM | POA: Diagnosis not present

## 2022-09-27 DIAGNOSIS — J8410 Pulmonary fibrosis, unspecified: Secondary | ICD-10-CM | POA: Diagnosis not present

## 2022-09-27 DIAGNOSIS — M069 Rheumatoid arthritis, unspecified: Secondary | ICD-10-CM | POA: Insufficient documentation

## 2022-09-27 DIAGNOSIS — L97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: Secondary | ICD-10-CM | POA: Insufficient documentation

## 2022-09-27 DIAGNOSIS — M2142 Flat foot [pes planus] (acquired), left foot: Secondary | ICD-10-CM | POA: Insufficient documentation

## 2022-09-27 DIAGNOSIS — G40909 Epilepsy, unspecified, not intractable, without status epilepticus: Secondary | ICD-10-CM | POA: Diagnosis not present

## 2022-09-27 DIAGNOSIS — L97822 Non-pressure chronic ulcer of other part of left lower leg with fat layer exposed: Secondary | ICD-10-CM | POA: Diagnosis not present

## 2022-09-27 DIAGNOSIS — K219 Gastro-esophageal reflux disease without esophagitis: Secondary | ICD-10-CM | POA: Insufficient documentation

## 2022-09-27 DIAGNOSIS — I890 Lymphedema, not elsewhere classified: Secondary | ICD-10-CM | POA: Diagnosis not present

## 2022-09-27 DIAGNOSIS — L97522 Non-pressure chronic ulcer of other part of left foot with fat layer exposed: Secondary | ICD-10-CM | POA: Diagnosis not present

## 2022-09-27 DIAGNOSIS — I872 Venous insufficiency (chronic) (peripheral): Secondary | ICD-10-CM | POA: Diagnosis not present

## 2022-09-27 DIAGNOSIS — E785 Hyperlipidemia, unspecified: Secondary | ICD-10-CM | POA: Diagnosis not present

## 2022-10-02 NOTE — Progress Notes (Signed)
AUBRIELLA, PEREZGARCIA ([id number]) [phone].pdf Page 1 of 9 Visit Report for 09/27/2022 Arrival Information Details Patient Name: Date of Service: [name], [place] [name] [name] 09/27/2022 11:15 A M Medical Record Number: [id number] Patient Account Number: [id number] Date of Birth/Sex: Treating RN: [birth year] (82 y.o. [name] Primary Care Vanilla Heatherington: Carolann Littler Other Clinician: Referring Marijayne Rauth: Treating Markeem Noreen/Extender: [name] in Treatment: 7 Visit Information History Since Last Visit Added or deleted any medications: No Patient Arrived: [name] Any new allergies or adverse reactions: No Arrival Time: 11:03 Had a fall or experienced change in No Accompanied By: self activities of daily living that may affect Transfer Assistance: Manual risk of falls: Patient Identification Verified: Yes Signs or symptoms of abuse/neglect since last visito No Patient Requires Transmission-Based Precautions: No Hospitalized since last visit: No Patient Has Alerts: No Implantable device outside of the clinic excluding No cellular tissue based products placed in the center since last visit: Has Dressing in Place as Prescribed: Yes Pain Present Now: No Electronic Signature(s) Signed: 09/27/2022 12:05:17 PM By: Dellie Catholic RN Entered By: Dellie Catholic on 09/27/2022 11:07:16 -------------------------------------------------------------------------------- Compression Therapy Details Patient Name: Date of Service: [name], [place] [name] [name] 09/27/2022 11:15 A M Medical Record Number: [id number] Patient Account Number: [id number] Date of Birth/Sex: Treating RN: [birth year] (82 y.o. Donalda Ewings Primary Care Ashyra Cantin: Carolann Littler Other Clinician: Referring Glenna Brunkow: Treating Ameira Alessandrini/Extender: [name] in Treatment: 7 Compression Therapy Performed for Wound Assessment: Wound #6 Left,Anterior Lower  Leg Performed By: Clinician Sharyn Creamer, RN Compression Type: Three Layer Post Procedure Diagnosis Same as Pre-procedure Electronic Signature(s) Signed: 10/01/2022 5:16:32 PM By: Sharyn Creamer RN, BSN Entered By: Sharyn Creamer on 09/27/2022 12:14:58 [name] ([id number]) [id number] Page 2 of 9 -------------------------------------------------------------------------------- Encounter Discharge Information Details Patient Name: Date of Service: Ludger Nutting [name] 09/27/2022 11:15 A M Medical Record Number: [id number] Patient Account Number: [id number] Date of Birth/Sex: Treating RN: [birth year] (82 y.o. Donalda Ewings Primary Care Latrena Benegas: Carolann Littler Other Clinician: Referring Aino Heckert: Treating Briceyda Abdullah/Extender: [name] in Treatment: 7 Encounter Discharge Information Items Post Procedure Vitals Discharge Condition: Stable Temperature (F): 97.8 Ambulatory Status: Walker Pulse (bpm): 90 Discharge Destination: Home Respiratory Rate (breaths/min): 16 Transportation: Private Auto Blood Pressure (mmHg): 161/86 Accompanied By: self Schedule Follow-up Appointment: No Clinical Summary of Care: Electronic Signature(s) Signed: 10/01/2022 5:16:32 PM By: Sharyn Creamer RN, BSN Entered By: Sharyn Creamer on 09/27/2022 12:16:14 -------------------------------------------------------------------------------- Lower Extremity Assessment Details Patient Name: Date of Service: [name], [place] [name] [name] 09/27/2022 11:15 A M Medical Record Number: [id number] Patient Account Number: [id number] Date of Birth/Sex: Treating RN: July 14, 1941 (82 y.o. [name] Primary Care Kamyla Olejnik: Carolann Littler Other Clinician: Referring Evonna Stoltz: Treating Merrillyn Ackerley/Extender: [name] in Treatment: 7 Edema Assessment Assessed: [Left: No] [Right: No] Edema: [Left: Ye] [Right: s] Calf Left:  Right: Point of Measurement: From Medial Instep 33 cm Ankle Left: Right: Point of Measurement: From Medial Instep 22.5 cm Vascular Assessment Pulses: Dorsalis Pedis Palpable: [Left:Yes] Electronic Signature(s) Signed: 09/27/2022 12:05:17 PM By: Dellie Catholic RN Entered By: Dellie Catholic on 09/27/2022 11:09:53 Multi Wound Chart Details -------------------------------------------------------------------------------- [name] ([id number]) [id number] Page 3 of 9 Patient Name: Date of Service: [name], [place] [name] [name] 09/27/2022 11:15 A M Medical Record Number: [id number] Patient Account Number: [id number] Date of Birth/Sex: Treating RN: 03-Jan-1941 (82 y.o. F) Primary Care Syncere Eble: Carolann Littler Other Clinician: Referring  Orland Visconti: Treating Marquet Faircloth/Extender: [name] in Treatment: 7 Vital Signs Height(in): 61 Pulse(bpm): 90 Weight(lbs): 128 Blood Pressure(mmHg): 161/86 Body Mass Index(BMI): 24.2 Temperature(F): 97.8 Respiratory Rate(breaths/min): 16 Wound Assessments Wound Number: 6 7 N/A Photos: No Photos N/A Left, Anterior Lower Leg Left, Plantar Foot N/A Wound Location: Trauma Gradually Appeared N/A Wounding Event: Venous Leg Ulcer Neuropathic Ulcer-Non Diabetic N/A Primary Etiology: Cataracts, Hypertension, Rheumatoid Cataracts, Hypertension, Rheumatoid N/A Comorbid History: Arthritis, Seizure Disorder Arthritis, Seizure Disorder 07/08/2022 06/24/2022 N/A Date Acquired: 7 7 N/A Weeks of Treatment: Open Open N/A Wound Status: No No N/A Wound Recurrence: 1.3x0.5x0.1 0.2x0.2x0.2 N/A Measurements L x W x D (cm) 0.511 0.031 N/A A (cm) : rea 0.051 0.006 N/A Volume (cm) : 98.40% 67.00% N/A % Reduction in A rea: 98.40% 78.60% N/A % Reduction in Volume: Full Thickness Without Exposed Full Thickness Without Exposed N/A Classification: Support Structures Support Structures Medium Small  N/A Exudate A mount: Serosanguineous Serosanguineous N/A Exudate Type: red, brown red, brown N/A Exudate Color: Flat and Intact Thickened N/A Wound Margin: Large (67-100%) Large (67-100%) N/A Granulation A mount: Pink Red N/A Granulation Quality: Small (1-33%) Small (1-33%) N/A Necrotic A mount: Fat Layer (Subcutaneous Tissue): Yes Fat Layer (Subcutaneous Tissue): Yes N/A Exposed Structures: Fascia: No Fascia: No Tendon: No Tendon: No Muscle: No Muscle: No Joint: No Joint: No Bone: No Bone: No Medium (34-66%) None N/A Epithelialization: Debridement - Selective/Open Wound Debridement - Selective/Open Wound N/A Debridement: Pre-procedure Verification/Time Out 11:20 11:20 N/A Taken: Lidocaine 5% topical ointment Lidocaine 5% topical ointment N/A Pain Control: Slough Callus N/A Tissue Debrided: Non-Viable Tissue Non-Viable Tissue N/A Level: 0.25 1 N/A Debridement A (sq cm): rea Curette Curette N/A Instrument: None None N/A Bleeding: 0 0 N/A Procedural Pain: 0 0 N/A Post Procedural Pain: Procedure was tolerated well Procedure was tolerated well N/A Debridement Treatment Response: 1.3x0.5x0.1 0.2x0.2x0.2 N/A Post Debridement Measurements L x W x D (cm) 0.051 0.006 N/A Post Debridement Volume: (cm) Excoriation: No Callus: Yes N/A Periwound Skin Texture: Induration: No Callus: No Crepitus: No Rash: No Scarring: No Maceration: No No Abnormalities Noted N/A Periwound Skin Moisture: Dry/Scaly: No Hemosiderin Staining: Yes No Abnormalities Noted N/A Periwound Skin Color: Atrophie Blanche: No Cyanosis: No Ecchymosis: No [name], [name] ([id number]) [id number] Page 4 of 9 Erythema: No Mottled: No Pallor: No Rubor: No No Abnormality No Abnormality N/A Temperature: Yes Yes N/A Tenderness on Palpation: Debridement Debridement N/A Procedures Performed: Treatment Notes Electronic Signature(s) Signed: 09/27/2022 11:33:40 AM  By: Fredirick Maudlin MD FACS Entered By: Fredirick Maudlin on 09/27/2022 11:33:40 -------------------------------------------------------------------------------- Multi-Disciplinary Care Plan Details Patient Name: Date of Service: [name], [place] [name] [name] 09/27/2022 11:15 A M Medical Record Number: [id number] Patient Account Number: [id number] Date of Birth/Sex: Treating RN: 03/23/1941 (82 y.o. Donalda Ewings Primary Care Kyro Joswick: Carolann Littler Other Clinician: Referring Vanna Shavers: Treating Abron Neddo/Extender: [name] in Treatment: 7 Multidisciplinary Care Plan reviewed with physician Active Inactive Venous Leg Ulcer Nursing Diagnoses: Knowledge deficit related to disease process and management Potential for venous Insuffiency (use before diagnosis confirmed) Goals: Patient will maintain optimal edema control Date Initiated: 08/07/2022 Target Resolution Date: 10/24/2022 Goal Status: Active Interventions: Assess peripheral edema status every visit. Compression as ordered Provide education on venous insufficiency Treatment Activities: Therapeutic compression applied : 08/07/2022 Notes: Wound/Skin Impairment Nursing Diagnoses: Impaired tissue integrity Knowledge deficit related to ulceration/compromised skin integrity Goals: Patient/caregiver will verbalize understanding of skin care regimen Date Initiated: 08/07/2022 Target Resolution Date: 10/24/2022 Goal Status: Active Ulcer/skin  breakdown will have a volume reduction of 30% by week 4 Date Initiated: 08/07/2022 Target Resolution Date: 10/24/2022 Goal Status: Active Interventions: Assess patient/caregiver ability to obtain necessary supplies Assess patient/caregiver ability to perform ulcer/skin care regimen upon admission and as needed Assess ulceration(s) every visit [name], [name] ([id number]) [id number] Page 5 of 9 Provide education on ulcer and skin  care Treatment Activities: Skin care regimen initiated : 08/07/2022 Topical wound management initiated : 08/07/2022 Notes: Electronic Signature(s) Signed: 10/01/2022 5:16:32 PM By: Sharyn Creamer RN, BSN Entered By: Sharyn Creamer on 09/27/2022 11:25:53 -------------------------------------------------------------------------------- Pain Assessment Details Patient Name: Date of Service: [name], [place] [name] [name] 09/27/2022 11:15 A M Medical Record Number: [id number] Patient Account Number: [id number] Date of Birth/Sex: Treating RN: 1941/02/14 (82 y.o. [name] Primary Care [name] Alarie: Carolann Littler Other Clinician: Referring Lanai Conlee: Treating Rishawn Walck/Extender: [name] in Treatment: 7 Active Problems Location of Pain Severity and Description of Pain Patient Has Paino No Site Locations Pain Management and Medication Current Pain Management: Electronic Signature(s) Signed: 09/27/2022 12:05:17 PM By: Dellie Catholic RN Entered By: Dellie Catholic on 09/27/2022 11:07:59 -------------------------------------------------------------------------------- Patient/Caregiver Education Details Patient Name: Date of Service: [name] [name] [name] 1/4/2024andnbsp11:15 A M Medical Record Number: [id number] Patient Account Number: [id number] Date of Birth/Gender: Treating RN: 1940/10/09 (82 y.o. Donalda Ewings Primary Care Physician: Carolann Littler Other Clinician: Referring Physician: Treating Physician/Extender: Floree, Zuniga ([id number]) [id number] Page 6 of 9 Weeks in Treatment: 7 Education Assessment Education Provided To: Patient Education Topics Provided Wound/Skin Impairment: Methods: Explain/Verbal Responses: State content correctly Electronic Signature(s) Signed: 10/01/2022 5:16:32 PM By: Sharyn Creamer RN, BSN Entered By: Sharyn Creamer on 09/27/2022  11:34:09 -------------------------------------------------------------------------------- Wound Assessment Details Patient Name: Date of Service: [name], [place] [name] [name] 09/27/2022 11:15 A M Medical Record Number: [id number] Patient Account Number: [id number] Date of Birth/Sex: Treating RN: Nov 23, 1940 (82 y.o. [name] Primary Care Vincie Linn: Carolann Littler Other Clinician: Referring Jawaun Celmer: Treating Karandeep Resende/Extender: [name] in Treatment: 7 Wound Status Wound Number: 6 Primary Venous Leg Ulcer Etiology: Wound Location: Left, Anterior Lower Leg Wound Status: Open Wounding Event: Trauma Comorbid Cataracts, Hypertension, Rheumatoid Arthritis, Seizure Date Acquired: 07/08/2022 History: Disorder Weeks Of Treatment: 7 Clustered Wound: No Photos Wound Measurements Length: (cm) 1.3 Width: (cm) 0.5 Depth: (cm) 0.1 Area: (cm) 0.511 Volume: (cm) 0.051 % Reduction in Area: 98.4% % Reduction in Volume: 98.4% Epithelialization: Medium (34-66%) Wound Description Classification: Full Thickness Without Exposed Support Structures Wound Margin: Flat and Intact Exudate Amount: Medium Exudate Type: Serosanguineous Exudate Color: red, brown Foul Odor After Cleansing: No Slough/Fibrino No Wound Bed [name], [name] ([id number]) [id number] Page 7 of 9 Granulation Amount: Large (67-100%) Exposed Structure Granulation Quality: Pink Fascia Exposed: No Necrotic Amount: Small (1-33%) Fat Layer (Subcutaneous Tissue) Exposed: Yes Necrotic Quality: Adherent Slough Tendon Exposed: No Muscle Exposed: No Joint Exposed: No Bone Exposed: No Periwound Skin Texture Texture Color No Abnormalities Noted: Yes No Abnormalities Noted: No Atrophie Blanche: No Moisture Cyanosis: No No Abnormalities Noted: Yes Ecchymosis: No Erythema: No Hemosiderin Staining: Yes Mottled: No Pallor: No Rubor: No Temperature /  Pain Temperature: No Abnormality Tenderness on Palpation: Yes Treatment Notes Wound #6 (Lower Leg) Wound Laterality: Left, Anterior Cleanser Soap and Water Discharge Instruction: May shower and wash wound with dial antibacterial soap and water prior to dressing change. Wound Cleanser Discharge Instruction: Cleanse the wound with wound cleanser prior to applying a clean dressing using  gauze sponges, not tissue or cotton balls. Peri-Wound Care Sween Lotion (Moisturizing lotion) Discharge Instruction: Apply moisturizing lotion as directed Topical Primary Dressing KerraCel Ag Gelling Fiber Dressing, 2x2 in (silver alginate) Discharge Instruction: Apply silver alginate to wound bed as instructed Secondary Dressing ABD Pad, 8x10 Discharge Instruction: Apply over primary dressing as directed. Secured With Compression Wrap ThreePress (3 layer compression wrap) Discharge Instruction: Apply three layer compression as directed. Compression Stockings Add-Ons Electronic Signature(s) Signed: 09/27/2022 12:05:17 PM By: Dellie Catholic RN Entered By: Dellie Catholic on 09/27/2022 11:16:25 -------------------------------------------------------------------------------- Wound Assessment Details Patient Name: Date of Service: [name], [place] [name] [name] 09/27/2022 11:15 A M Medical Record Number: [id number] Patient Account Number: [id number] Date of Birth/Sex: Treating RN: 12/17/1940 (82 y.o. Donalda Ewings Primary Care Caylon Saine: Carolann Littler Other Clinician: Earnest [name] ([id number]) [id number] Page 8 of 9 Referring Amy Gothard: Treating Michell Kader/Extender: [name] in Treatment: 7 Wound Status Wound Number: 7 Primary Neuropathic Ulcer-Non Diabetic Etiology: Wound Location: Left, Plantar Foot Wound Status: Open Wounding Event: Gradually Appeared Comorbid Cataracts, Hypertension, Rheumatoid Arthritis, Seizure Date Acquired:  06/24/2022 History: Disorder Weeks Of Treatment: 7 Clustered Wound: No Wound Measurements Length: (cm) 0.2 Width: (cm) 0.2 Depth: (cm) 0.2 Area: (cm) 0.031 Volume: (cm) 0.006 % Reduction in Area: 67% % Reduction in Volume: 78.6% Epithelialization: None Tunneling: No Undermining: No Wound Description Classification: Full Thickness Without Exposed Support Structures Wound Margin: Thickened Exudate Amount: Small Exudate Type: Serosanguineous Exudate Color: red, brown Foul Odor After Cleansing: No Slough/Fibrino Yes Wound Bed Granulation Amount: Large (67-100%) Exposed Structure Granulation Quality: Red Fascia Exposed: No Necrotic Amount: Small (1-33%) Fat Layer (Subcutaneous Tissue) Exposed: Yes Necrotic Quality: Adherent Slough Tendon Exposed: No Muscle Exposed: No Joint Exposed: No Bone Exposed: No Periwound Skin Texture Texture Color No Abnormalities Noted: No No Abnormalities Noted: Yes Callus: Yes Temperature / Pain Temperature: No Abnormality Moisture No Abnormalities Noted: Yes Tenderness on Palpation: Yes Treatment Notes Wound #7 (Foot) Wound Laterality: Plantar, Left Cleanser Soap and Water Discharge Instruction: May shower and wash wound with dial antibacterial soap and water prior to dressing change. Peri-Wound Care Sween Lotion (Moisturizing lotion) Discharge Instruction: Apply moisturizing lotion as directed Topical Primary Dressing Sorbalgon AG Dressing 2x2 (in/in) Discharge Instruction: Apply to wound bed as instructed Secondary Dressing Optifoam Non-Adhesive Dressing, 4x4 in Discharge Instruction: Apply over primary dressing as directed. Woven Gauze Sponge, Non-Sterile 4x4 in Discharge Instruction: Apply over primary dressing as directed. Secured With Compression Wrap Compression Stockings Add-Ons Electronic Signature(s) [name], [name] ([id number]) [id number] Page 9 of 9 Signed: 10/01/2022 5:16:32 PM By:  Sharyn Creamer RN, BSN Entered By: Sharyn Creamer on 09/27/2022 11:30:15 -------------------------------------------------------------------------------- Vitals Details Patient Name: Date of Service: [name], [place] [name] [name] 09/27/2022 11:15 A M Medical Record Number: [id number] Patient Account Number: [id number] Date of Birth/Sex: Treating RN: 11/30/1940 (82 y.o. [name] Primary Care Nikolaus Pienta: Carolann Littler Other Clinician: Referring Valla Pacey: Treating Cheick Suhr/Extender: [name] in Treatment: 7 Vital Signs Time Taken: 11:08 Temperature (F): 97.8 Height (in): 61 Pulse (bpm): 90 Weight (lbs): 128 Respiratory Rate (breaths/min): 16 Body Mass Index (BMI): 24.2 Blood Pressure (mmHg): 161/86 Reference Range: 80 - 120 mg / dl Electronic Signature(s) Signed: 09/27/2022 12:05:17 PM By: Dellie Catholic RN Entered By: Dellie Catholic on 09/27/2022 11:07:52

## 2022-10-02 NOTE — Progress Notes (Signed)
[name], [name] ([id number]) [id number] Page 1 of 11 Visit Report for 09/27/2022 Chief Complaint Document Details Patient Name: Date of Service: [name], [place] [name] [name] 09/27/2022 11:15 A M Medical Record Number: [id number] Patient Account Number: [id number] Date of Birth/Sex: Treating RN: 03-Aug-1941 (82 y.o. F) Primary Care Provider: Carolann Littler Other Clinician: Referring Provider: Treating Provider/Extender: [name] in Treatment: 7 Information Obtained from: Patient Chief Complaint Patient seen for complaints of Non-Healing Wound. Electronic Signature(s) Signed: 09/27/2022 11:33:48 AM By: Fredirick Maudlin MD FACS Entered By: Fredirick Maudlin on 09/27/2022 11:33:48 -------------------------------------------------------------------------------- Debridement Details Patient Name: Date of Service: [name], [place] [name] [name] 09/27/2022 11:15 A M Medical Record Number: [id number] Patient Account Number: [id number] Date of Birth/Sex: Treating RN: 1941-03-10 (82 y.o. [name] Primary Care Provider: Carolann Littler Other Clinician: Referring Provider: Treating Provider/Extender: [name] in Treatment: 7 Debridement Performed for Assessment: Wound #6 Left,Anterior Lower Leg Performed By: Physician Fredirick Maudlin, MD Debridement Type: Debridement Severity of Tissue Pre Debridement: Fat layer exposed Level of Consciousness (Pre-procedure): Awake and Alert Pre-procedure Verification/Time Out Yes - 11:20 Taken: Start Time: 11:25 Pain Control: Lidocaine 5% topical ointment T Area Debrided (L x W): otal 0.5 (cm) x 0.5 (cm) = 0.25 (cm) Tissue and other material debrided: Non-Viable, Slough, Slough Level: Non-Viable Tissue Debridement Description: Selective/Open Wound Instrument: Curette Bleeding: None Procedural Pain: 0 Post Procedural Pain: 0 Response to Treatment: Procedure was tolerated  well Level of Consciousness (Post- Awake and Alert procedure): Post Debridement Measurements of Total Wound Length: (cm) 1.3 Width: (cm) 0.5 Depth: (cm) 0.1 Volume: (cm) 0.051 Character of Wound/Ulcer Post Debridement: Improved Severity of Tissue Post Debridement: Fat layer exposed Post Procedure Diagnosis [name], [name] ([id number]) [id number] Page 2 of 11 Same as Pre-procedure Notes Scribed for Dr Micael Barb by Sharyn Creamer, Rn Electronic Signature(s) Signed: 09/27/2022 12:11:14 PM By: Fredirick Maudlin MD FACS Signed: 10/01/2022 5:16:32 PM By: Sharyn Creamer RN, BSN Entered By: Sharyn Creamer on 09/27/2022 11:28:42 -------------------------------------------------------------------------------- Debridement Details Patient Name: Date of Service: [name], [place] [name] [name] 09/27/2022 11:15 A M Medical Record Number: [id number] Patient Account Number: [id number] Date of Birth/Sex: Treating RN: [birth year] (82 y.o. [name] Primary Care Provider: Carolann Littler Other Clinician: Referring Provider: Treating Provider/Extender: [name] in Treatment: 7 Debridement Performed for Assessment: Wound #7 Left,Plantar Foot Performed By: Physician Fredirick Maudlin, MD Debridement Type: Debridement Level of Consciousness (Pre-procedure): Awake and Alert Pre-procedure Verification/Time Out Yes - 11:20 Taken: Start Time: 11:25 Pain Control: Lidocaine 5% topical ointment T Area Debrided (L x W): otal 1 (cm) x 1 (cm) = 1 (cm) Tissue and other material debrided: Non-Viable, Callus Level: Non-Viable Tissue Debridement Description: Selective/Open Wound Instrument: Curette Bleeding: None Procedural Pain: 0 Post Procedural Pain: 0 Response to Treatment: Procedure was tolerated well Level of Consciousness (Post- Awake and Alert procedure): Post Debridement Measurements of Total Wound Length: (cm) 0.2 Width: (cm) 0.2 Depth: (cm)  0.2 Volume: (cm) 0.006 Character of Wound/Ulcer Post Debridement: Improved Post Procedure Diagnosis Same as Pre-procedure Notes Scribed for Dr Namya Voges by Sharyn Creamer, Rn Electronic Signature(s) Signed: 09/27/2022 12:11:14 PM By: Fredirick Maudlin MD FACS Signed: 10/01/2022 5:16:32 PM By: Sharyn Creamer RN, BSN Entered By: Sharyn Creamer on 09/27/2022 11:31:03 HPI Details -------------------------------------------------------------------------------- [name] ([id number]) [id number] Page 3 of 11 Patient Name: Date of Service: [name], [place] [name] [name] 09/27/2022 11:15 A M Medical Record Number: [id number] Patient Account Number:  [id number] Date of Birth/Sex: Treating RN: [birth year] (82 y.o. F) Primary Care Provider: Carolann Littler Other Clinician: Referring Provider: Treating Provider/Extender: [name] in Treatment: 7 History of Present Illness HPI Description: ADMISSION 11.14.2023 This is an 82 year old woman on chronic steroids and anticoagulants who fell near the beginning of October. She initially developed a hematoma on her left lower leg which subsequently ruptured. She presented to the emergency department on November 6 with concern for a wound infection due to developing erythema and warmth. The wound was debrided in the ED and clindamycin prescribed along with a recommendation for probiotics. Wet-to-dry dressings were prescribed, as well. She was seen again in the emergency room 3 days later for other symptoms. Photographs in the electronic medical record show that the wound is quite a bit cleaner. Wound care was consulted at that time. They recommended daily soap and water cleanse and Xeroform topped with an ABD and a Kerlix and Ace wrap. Upon her discharge from the hospital, she was referred to the wound care center for further evaluation and management. ABI in clinic was 1.25. On intake, she was also noted to  have an ulcer on her left midfoot associated with a fallen arch and callus. On her left anterior tibial surface, there is a large ovoid wound with some evidence of epithelialization. There is granulation tissue and slough present. No concern for infection. On the left midfoot, there is a large callus, in the center of which there is an ulceration exposing the fat layer. No malodor or purulent drainage. 08/14/2022: The left anterior tibial wound is smaller and cleaner. There is more epithelialization present. There is some slough accumulation on the surface. When her dressing was removed from the midfoot wound, pus drained out. The patient did say she had been having quite a bit more pain in her foot during the week. 08/21/2022: The culture that I took last week grew out very low levels of just some skin flora. The patient filled but did not take the antibiotic that I prescribed, as her primary care doctor put her on doxycycline for an upper respiratory infection. The anterior tibial wound is looking good. There is robust granulation tissue with a light layer of slough on the surface. The plantar midfoot wound has accumulated a thick callus once again. The opening in the skin is quite small. No purulent drainage present. 08/29/2022: The anterior tibial wound continues to contract. There is minimal slough and there has been greater epithelialization. She does have a bit of hypertrophic granulation tissue present. The plantar midfoot wound is once again covered and callus. The opening in the skin is basically unchanged. No concern for infection. 12/13; thick eschar on the anterior tibial wound. The wound on her left foot has healed. This is in the medial part of the foot. She has thick hemorrhagic callus but no open wound in this area. 09/27/2022: The anterior tibial wound is nearly healed with just a couple of tiny open areas underlying some very thin eschar. The medial plantar foot wound is covered with  a thick layer of callus, once this was removed, a tiny open area was appreciated. Electronic Signature(s) Signed: 09/27/2022 11:34:56 AM By: Fredirick Maudlin MD FACS Entered By: Fredirick Maudlin on 09/27/2022 11:34:56 -------------------------------------------------------------------------------- Physical Exam Details Patient Name: Date of Service: [name], [place] [name] [name] 09/27/2022 11:15 A M Medical Record Number: [id number] Patient Account Number: [id number] Date of Birth/Sex: Treating RN: Nov 28, 1940 (82 y.o. F) Primary Care Provider: Carolann Littler  Other Clinician: Referring Provider: Treating Provider/Extender: [name] in Treatment: 7 Constitutional She is hypertensive, but asymptomatic.. . . . No acute distress. Respiratory Normal work of breathing on room air. Notes 09/27/2022: The anterior tibial wound is nearly healed with just a couple of tiny open areas underlying some very thin eschar. The medial plantar foot wound is covered with a thick layer of callus, once this was removed, a tiny open area was appreciated. Electronic Signature(s) Signed: 09/27/2022 11:35:29 AM By: Fredirick Maudlin MD FACS Entered By: Fredirick Maudlin on 09/27/2022 11:35:29 [name] ([id number]) [id number] Page 4 of 11 -------------------------------------------------------------------------------- Physician Orders Details Patient Name: Date of Service: Ludger Nutting [name] 09/27/2022 11:15 A M Medical Record Number: [id number] Patient Account Number: [id number] Date of Birth/Sex: Treating RN: [birth year] (82 y.o. [name] Primary Care Provider: Carolann Littler Other Clinician: Referring Provider: Treating Provider/Extender: [name] in Treatment: 7 Verbal / Phone Orders: No Diagnosis Coding ICD-10 Coding Code Description [phone] Non-pressure chronic ulcer of other part of left lower leg with  fat layer exposed L97.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 G63 Polyneuropathy in diseases classified elsewhere N18.2 Chronic kidney disease, stage 2 (mild) M06.9 Rheumatoid arthritis, unspecified I10 Essential (primary) hypertension Z79.52 Long term (current) use of systemic steroids Follow-up Appointments ppointment in 1 week. - Dr Celine Ahr Return A Anesthetic Wound #6 Left,Anterior Lower Leg (In clinic) Topical Lidocaine 4% applied to wound bed Bathing/ Shower/ Hygiene May shower with protection but do not get wound dressing(s) wet. Protect dressing(s) with water repellant cover (for example, large plastic bag) or a cast cover and may then take shower. Edema Control - Lymphedema / SCD / Other Avoid standing for long periods of time. Patient to wear own compression stockings every day. - right leg Exercise regularly Wound Treatment Wound #6 - Lower Leg Wound Laterality: Left, Anterior Cleanser: Soap and Water 1 x Per Week/30 Days Discharge Instructions: May shower and wash wound with dial antibacterial soap and water prior to dressing change. Cleanser: Wound Cleanser 1 x Per Week/30 Days Discharge Instructions: Cleanse the wound with wound cleanser prior to applying a clean dressing using gauze sponges, not tissue or cotton balls. Peri-Wound Care: Sween Lotion (Moisturizing lotion) 1 x Per Week/30 Days Discharge Instructions: Apply moisturizing lotion as directed Prim Dressing: KerraCel Ag Gelling Fiber Dressing, 2x2 in (silver alginate) 1 x Per Week/30 Days ary Discharge Instructions: Apply silver alginate to wound bed as instructed Secondary Dressing: ABD Pad, 8x10 1 x Per Week/30 Days Discharge Instructions: Apply over primary dressing as directed. Compression Wrap: ThreePress (3 layer compression wrap) 1 x Per Week/30 Days Discharge Instructions: Apply three layer compression as directed. Wound #7 - Foot Wound Laterality: Plantar,  Left Cleanser: Soap and Water Discharge Instructions: May shower and wash wound with dial antibacterial soap and water prior to dressing change. Peri-Wound Care: Sween Lotion (Moisturizing lotion) Discharge Instructions: Apply moisturizing lotion as directed Prim Dressing: Sorbalgon AG Dressing 2x2 (in/in) ary Discharge Instructions: Apply to wound bed as instructed [name], [name] ([id number]) [id number] Page 5 of 11 Secondary Dressing: Optifoam Non-Adhesive Dressing, 4x4 in Discharge Instructions: Apply over primary dressing as directed. Secondary Dressing: Woven Gauze Sponge, Non-Sterile 4x4 in Discharge Instructions: Apply over primary dressing as directed. Patient Medications llergies: hydromorphone A Notifications Medication Indication Start End prior to debridement 09/27/2022 lidocaine DOSE topical 4 % cream - cream topical once daily Electronic Signature(s) Signed: 09/27/2022 12:11:14  PM By: Fredirick Maudlin MD FACS Entered By: Fredirick Maudlin on 09/27/2022 11:35:42 -------------------------------------------------------------------------------- Problem List Details Patient Name: Date of Service: [name], [place] [name] [name] 09/27/2022 11:15 A M Medical Record Number: [id number] Patient Account Number: [id number] Date of Birth/Sex: Treating RN: December 23, 1940 (82 y.o. F) Primary Care Provider: Carolann Littler Other Clinician: Referring Provider: Treating Provider/Extender: [name] in Treatment: 7 Active Problems ICD-10 Encounter Code Description Active Date MDM Diagnosis [id number] Non-pressure chronic ulcer of other part of left lower leg with fat layer exposed11/14/2023 No Yes L97.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 08/07/2022 No Yes G63 Polyneuropathy in diseases classified elsewhere 08/07/2022 No Yes N18.2 Chronic kidney disease, stage 2 (mild) 08/07/2022 No Yes M06.9  Rheumatoid arthritis, unspecified 08/07/2022 No Yes I10 Essential (primary) hypertension 08/07/2022 No Yes Z79.52 Long term (current) use of systemic steroids 08/07/2022 No Yes Inactive Problems Resolved Problems [name], [name] ([id number]) [id number] Page 6 of 11 Electronic Signature(s) Signed: 09/27/2022 11:33:32 AM By: Fredirick Maudlin MD FACS Entered By: Fredirick Maudlin on 09/27/2022 11:33:32 -------------------------------------------------------------------------------- Progress Note Details Patient Name: Date of Service: [name], [place] [name] [name] 09/27/2022 11:15 A M Medical Record Number: [id number] Patient Account Number: [id number] Date of Birth/Sex: Treating RN: 1941-08-04 (82 y.o. F) Primary Care Provider: Carolann Littler Other Clinician: Referring Provider: Treating Provider/Extender: [name] in Treatment: 7 Subjective Chief Complaint Information obtained from Patient Patient seen for complaints of Non-Healing Wound. History of Present Illness (HPI) ADMISSION 11.14.2023 This is an 82 year old woman on chronic steroids and anticoagulants who fell near the beginning of October. She initially developed a hematoma on her left lower leg which subsequently ruptured. She presented to the emergency department on November 6 with concern for a wound infection due to developing erythema and warmth. The wound was debrided in the ED and clindamycin prescribed along with a recommendation for probiotics. Wet-to-dry dressings were prescribed, as well. She was seen again in the emergency room 3 days later for other symptoms. Photographs in the electronic medical record show that the wound is quite a bit cleaner. Wound care was consulted at that time. They recommended daily soap and water cleanse and Xeroform topped with an ABD and a Kerlix and Ace wrap. Upon her discharge from the hospital, she was referred to the wound care center  for further evaluation and management. ABI in clinic was 1.25. On intake, she was also noted to have an ulcer on her left midfoot associated with a fallen arch and callus. On her left anterior tibial surface, there is a large ovoid wound with some evidence of epithelialization. There is granulation tissue and slough present. No concern for infection. On the left midfoot, there is a large callus, in the center of which there is an ulceration exposing the fat layer. No malodor or purulent drainage. 08/14/2022: The left anterior tibial wound is smaller and cleaner. There is more epithelialization present. There is some slough accumulation on the surface. When her dressing was removed from the midfoot wound, pus drained out. The patient did say she had been having quite a bit more pain in her foot during the week. 08/21/2022: The culture that I took last week grew out very low levels of just some skin flora. The patient filled but did not take the antibiotic that I prescribed, as her primary care doctor put her on doxycycline for an upper respiratory infection. The anterior tibial wound is looking good. There  is robust granulation tissue with a light layer of slough on the surface. The plantar midfoot wound has accumulated a thick callus once again. The opening in the skin is quite small. No purulent drainage present. 08/29/2022: The anterior tibial wound continues to contract. There is minimal slough and there has been greater epithelialization. She does have a bit of hypertrophic granulation tissue present. The plantar midfoot wound is once again covered and callus. The opening in the skin is basically unchanged. No concern for infection. 12/13; thick eschar on the anterior tibial wound. The wound on her left foot has healed. This is in the medial part of the foot. She has thick hemorrhagic callus but no open wound in this area. 09/27/2022: The anterior tibial wound is nearly healed with just a couple of  tiny open areas underlying some very thin eschar. The medial plantar foot wound is covered with a thick layer of callus, once this was removed, a tiny open area was appreciated. Patient History Information obtained from Patient, Chart. Family History Cancer - Mother,Siblings,Maternal Grandparents, Hypertension - Father, Seizures - Mother, Stroke - Mother, No family history of Diabetes, Heart Disease, Hereditary Spherocytosis, Kidney Disease, Lung Disease, Thyroid Problems, Tuberculosis. Social History Never smoker, Marital Status - Widowed, Alcohol Use - Rarely, Drug Use - No History, Caffeine Use - Daily - tea. Medical History Eyes Patient has history of Cataracts - bil extracted Denies history of Glaucoma, Optic Neuritis Cardiovascular Patient has history of Hypertension Genitourinary Denies history of End Stage Renal Disease Integumentary (Skin) Denies history of History of Burn Musculoskeletal Patient has history of Rheumatoid Arthritis [name], [name] ([id number]) [id number] Page 7 of 11 Neurologic Patient has history of Seizure Disorder - hx Oncologic Denies history of Received Chemotherapy, Received Radiation Psychiatric Denies history of Anorexia/bulimia, Confinement Anxiety Medical A Surgical History Notes nd Respiratory hx pulmonary fibrosis Cardiovascular hx temporal arteritis, hyperlipidemia Gastrointestinal GERD, diverticulosis Genitourinary CRI stage 2 Musculoskeletal osteoporosis Neurologic TIA, CVA, chronic back pain Oncologic hx breast CA Objective Constitutional She is hypertensive, but asymptomatic.Marland Kitchen No acute distress. Vitals Time Taken: 11:08 AM, Height: 61 in, Weight: 128 lbs, BMI: 24.2, Temperature: 97.8 F, Pulse: 90 bpm, Respiratory Rate: 16 breaths/min, Blood Pressure: 161/86 mmHg. Respiratory Normal work of breathing on room air. General Notes: 09/27/2022: The anterior tibial wound is nearly healed with just a  couple of tiny open areas underlying some very thin eschar. The medial plantar foot wound is covered with a thick layer of callus, once this was removed, a tiny open area was appreciated. Integumentary (Hair, Skin) Wound #6 status is Open. Original cause of wound was Trauma. The date acquired was: 07/08/2022. The wound has been in treatment 7 weeks. The wound is located on the Left,Anterior Lower Leg. The wound measures 1.3cm length x 0.5cm width x 0.1cm depth; 0.511cm^2 area and 0.051cm^3 volume. There is Fat Layer (Subcutaneous Tissue) exposed. There is a medium amount of serosanguineous drainage noted. The wound margin is flat and intact. There is large (67- 100%) pink granulation within the wound bed. There is a small (1-33%) amount of necrotic tissue within the wound bed including Adherent Slough. The periwound skin appearance had no abnormalities noted for texture. The periwound skin appearance had no abnormalities noted for moisture. The periwound skin appearance exhibited: Hemosiderin Staining. The periwound skin appearance did not exhibit: Atrophie Blanche, Cyanosis, Ecchymosis, Mottled, Pallor, Rubor, Erythema. Periwound temperature was noted as No Abnormality. The periwound has tenderness on palpation. Wound #7 status is Open. Original cause of wound  was Gradually Appeared. The date acquired was: 06/24/2022. The wound has been in treatment 7 weeks. The wound is located on the [place]. The wound measures 0.2cm length x 0.2cm width x 0.2cm depth; 0.031cm^2 area and 0.006cm^3 volume. There is Fat Layer (Subcutaneous Tissue) exposed. There is no tunneling or undermining noted. There is a small amount of serosanguineous drainage noted. The wound margin is thickened. There is large (67-100%) red granulation within the wound bed. There is a small (1-33%) amount of necrotic tissue within the wound bed including Adherent Slough. The periwound skin appearance had no abnormalities noted for  moisture. The periwound skin appearance had no abnormalities noted for color. The periwound skin appearance exhibited: Callus. Periwound temperature was noted as No Abnormality. The periwound has tenderness on palpation. Assessment Active Problems ICD-10 Non-pressure chronic ulcer of other part of left lower leg with fat layer exposed Non-pressure chronic ulcer of left heel and midfoot with fat layer exposed Polyneuropathy in diseases classified elsewhere Chronic kidney disease, stage 2 (mild) Rheumatoid arthritis, unspecified Essential (primary) hypertension Long term (current) use of systemic steroids Procedures [name], [name] ([id number]) [id number] Page 8 of 11 Wound #6 Pre-procedure diagnosis of Wound #6 is a Venous Leg Ulcer located on the Left,Anterior Lower Leg .Severity of Tissue Pre Debridement is: Fat layer exposed. There was a Selective/Open Wound Non-Viable Tissue Debridement with a total area of 0.25 sq cm performed by Fredirick Maudlin, MD. With the following instrument(s): Curette to remove Non-Viable tissue/material. Material removed includes [facility] after achieving pain control using Lidocaine 5% topical ointment. No specimens were taken. A time out was conducted at 11:20, prior to the start of the procedure. There was no bleeding. The procedure was tolerated well with a pain level of 0 throughout and a pain level of 0 following the procedure. Post Debridement Measurements: 1.3cm length x 0.5cm width x 0.1cm depth; 0.051cm^3 volume. Character of Wound/Ulcer Post Debridement is improved. Severity of Tissue Post Debridement is: Fat layer exposed. Post procedure Diagnosis Wound #6: Same as Pre-Procedure General Notes: Scribed for Dr Zianna Dercole by Sharyn Creamer, Rn. Wound #7 Pre-procedure diagnosis of Wound #7 is a Neuropathic Ulcer-Non Diabetic located on the Left,Plantar Foot . There was a Selective/Open Wound Non-Viable Tissue Debridement with a  total area of 1 sq cm performed by Fredirick Maudlin, MD. With the following instrument(s): Curette to remove Non-Viable tissue/material. Material removed includes Callus after achieving pain control using Lidocaine 5% topical ointment. No specimens were taken. A time out was conducted at 11:20, prior to the start of the procedure. There was no bleeding. The procedure was tolerated well with a pain level of 0 throughout and a pain level of 0 following the procedure. Post Debridement Measurements: 0.2cm length x 0.2cm width x 0.2cm depth; 0.006cm^3 volume. Character of Wound/Ulcer Post Debridement is improved. Post procedure Diagnosis Wound #7: Same as Pre-Procedure General Notes: Scribed for Dr Trayonna Bachmeier by Sharyn Creamer, Rn. Plan Follow-up Appointments: Return Appointment in 1 week. - Dr Celine Ahr Anesthetic: Wound #6 Left,Anterior Lower Leg: (In clinic) Topical Lidocaine 4% applied to wound bed Bathing/ Shower/ Hygiene: May shower with protection but do not get wound dressing(s) wet. Protect dressing(s) with water repellant cover (for example, large plastic bag) or a cast cover and may then take shower. Edema Control - Lymphedema / SCD / Other: Avoid standing for long periods of time. Patient to wear own compression stockings every day. - right leg Exercise regularly The following medication(s) was prescribed: lidocaine topical 4 % cream cream  topical once daily for prior to debridement was prescribed at facility WOUND #6: - Lower Leg Wound Laterality: Left, Anterior Cleanser: Soap and Water 1 x Per Week/30 Days Discharge Instructions: May shower and wash wound with dial antibacterial soap and water prior to dressing change. Cleanser: Wound Cleanser 1 x Per Week/30 Days Discharge Instructions: Cleanse the wound with wound cleanser prior to applying a clean dressing using gauze sponges, not tissue or cotton balls. Peri-Wound Care: Sween Lotion (Moisturizing lotion) 1 x Per Week/30 Days Discharge  Instructions: Apply moisturizing lotion as directed Prim Dressing: KerraCel Ag Gelling Fiber Dressing, 2x2 in (silver alginate) 1 x Per Week/30 Days ary Discharge Instructions: Apply silver alginate to wound bed as instructed Secondary Dressing: ABD Pad, 8x10 1 x Per Week/30 Days Discharge Instructions: Apply over primary dressing as directed. Com pression Wrap: ThreePress (3 layer compression wrap) 1 x Per Week/30 Days Discharge Instructions: Apply three layer compression as directed. WOUND #7: - Foot Wound Laterality: Plantar, Left Cleanser: Soap and Water Discharge Instructions: May shower and wash wound with dial antibacterial soap and water prior to dressing change. Peri-Wound Care: Sween Lotion (Moisturizing lotion) Discharge Instructions: Apply moisturizing lotion as directed Prim Dressing: Katherine Dressing 2x2 (in/in) ary Discharge Instructions: Apply to wound bed as instructed Secondary Dressing: Optifoam Non-Adhesive Dressing, 4x4 in Discharge Instructions: Apply over primary dressing as directed. Secondary Dressing: Woven Gauze Sponge, Non-Sterile 4x4 in Discharge Instructions: Apply over primary dressing as directed. 09/27/2022: The anterior tibial wound is nearly healed with just a couple of tiny open areas underlying some very thin eschar. The medial plantar foot wound is covered with a thick layer of callus, once this was removed, a tiny open area was appreciated. I used a curette to debride eschar from the anterior tibial wound and callus from the plantar foot wound. We will use silver alginate to both sites. 3 layer compression to the leg and a foam donut for the foot. Follow-up in 1 week. Electronic Signature(s) Signed: 09/27/2022 11:42:44 AM By: Fredirick Maudlin MD FACS Entered By: Fredirick Maudlin on 09/27/2022 11:42:44 [name] ([id number]) [id number] Page 9 of  11 -------------------------------------------------------------------------------- HxROS Details Patient Name: Date of Service: [name], [place] [name] [name] 09/27/2022 11:15 A M Medical Record Number: [id number] Patient Account Number: [id number] Date of Birth/Sex: Treating RN: 24-Aug-1941 (82 y.o. F) Primary Care Provider: Carolann Littler Other Clinician: Referring Provider: Treating Provider/Extender: [name] in Treatment: 7 Information Obtained From Patient Chart Eyes Medical History: Positive for: Cataracts - bil extracted Negative for: Glaucoma; Optic Neuritis Respiratory Medical History: Past Medical History Notes: hx pulmonary fibrosis Cardiovascular Medical History: Positive for: Hypertension Past Medical History Notes: hx temporal arteritis, hyperlipidemia Gastrointestinal Medical History: Past Medical History Notes: GERD, diverticulosis Genitourinary Medical History: Negative for: End Stage Renal Disease Past Medical History Notes: CRI stage 2 Integumentary (Skin) Medical History: Negative for: History of Burn Musculoskeletal Medical History: Positive for: Rheumatoid Arthritis Past Medical History Notes: osteoporosis Neurologic Medical History: Positive for: Seizure Disorder - hx Past Medical History Notes: TIA, CVA, chronic back pain Oncologic Medical History: Negative for: Received Chemotherapy; Received Radiation Past Medical History Notes: hx breast CA Psychiatric Medical History: Negative for: Pollyann Savoy [name], [name] ([id number]) [id number] Page 10 of 11 HBO Extended History Items Eyes: Cataracts Immunizations Pneumococcal Vaccine: Received Pneumococcal Vaccination: Yes Received Pneumococcal Vaccination On or After 60th Birthday: Yes Implantable Devices No devices added Family and Social History Cancer: Yes - Mother,Siblings,Maternal Grandparents;  Diabetes: No;  Heart Disease: No; Hereditary Spherocytosis: No; Hypertension: Yes - Father; Kidney Disease: No; Lung Disease: No; Seizures: Yes - Mother; Stroke: Yes - Mother; Thyroid Problems: No; Tuberculosis: No; Never smoker; Marital Status - Widowed; Alcohol Use: Rarely; Drug Use: No History; Caffeine Use: Daily - tea; Financial Concerns: No; Food, Clothing or Shelter Needs: No; Support System Lacking: No Electronic Signature(s) Signed: 09/27/2022 12:11:14 PM By: Fredirick Maudlin MD FACS Entered By: Fredirick Maudlin on 09/27/2022 11:35:02 -------------------------------------------------------------------------------- SuperBill Details Patient Name: Date of Service: [name], [place] [name] [name] 09/27/2022 Medical Record Number: [id number] Patient Account Number: [id number] Date of Birth/Sex: Treating RN: 07/09/1941 (81 y.o. 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 G63 Polyneuropathy in diseases classified elsewhere N18.2 Chronic kidney disease, stage 2 (mild) M06.9 Rheumatoid arthritis, unspecified I10 Essential (primary) hypertension Z79.52 Long term (current) use of systemic steroids Facility Procedures : CPT4 Code: [id number] Description: 97597 - DEBRIDE WOUND 1ST 20 SQ CM OR < ICD-10 Diagnosis Description L97.822 Non-pressure chronic ulcer of other part of left lower leg with fat layer expose L97.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 Modifier: d Quantity: 1 Physician Procedures : CPT4 Code Description Modifier [id number] - WC PHYS LEVEL 3 - EST PT 25 ICD-10 Diagnosis Description L97.822 Non-pressure chronic  ulcer of other part of left lower leg with fat layer exposed L97.422 Non-pressure chronic ulcer of left heel and  midfoot with fat layer exposed Z79.52 Long term (current) use of systemic steroids G63 Polyneuropathy in diseases classified elsewhere [name], [name] ([id number]) [id number] - WC PHYS DEBR WO ANESTH 20 SQ CM 1  ICD-10 Diagnosis Description L97.822 Non-pressure chronic ulcer of other part of left lower leg with fat layer exposed L97.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

## 2022-10-05 ENCOUNTER — Encounter (HOSPITAL_BASED_OUTPATIENT_CLINIC_OR_DEPARTMENT_OTHER): Payer: Medicare Other | Admitting: General Surgery

## 2022-10-05 DIAGNOSIS — L97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: Secondary | ICD-10-CM | POA: Diagnosis not present

## 2022-10-05 DIAGNOSIS — S91302A Unspecified open wound, left foot, initial encounter: Secondary | ICD-10-CM | POA: Diagnosis not present

## 2022-10-05 DIAGNOSIS — E785 Hyperlipidemia, unspecified: Secondary | ICD-10-CM | POA: Diagnosis not present

## 2022-10-05 DIAGNOSIS — G40909 Epilepsy, unspecified, not intractable, without status epilepticus: Secondary | ICD-10-CM | POA: Diagnosis not present

## 2022-10-05 DIAGNOSIS — S81802A Unspecified open wound, left lower leg, initial encounter: Secondary | ICD-10-CM | POA: Diagnosis not present

## 2022-10-05 DIAGNOSIS — L97822 Non-pressure chronic ulcer of other part of left lower leg with fat layer exposed: Secondary | ICD-10-CM | POA: Diagnosis not present

## 2022-10-05 DIAGNOSIS — I890 Lymphedema, not elsewhere classified: Secondary | ICD-10-CM | POA: Diagnosis not present

## 2022-10-05 DIAGNOSIS — M069 Rheumatoid arthritis, unspecified: Secondary | ICD-10-CM | POA: Diagnosis not present

## 2022-10-05 NOTE — Progress Notes (Signed)
[name], [name] ([id number]) [id number] Page 1 of 9 Visit Report for 10/05/2022 Arrival Information Details Patient Name: Date of Service: [name], [place] [name] [name] 10/05/2022 10:45 A M Medical Record Number: [id number] Patient Account Number: [id number] Date of Birth/Sex: Treating RN: [birth year] (82 y.o. [name] Primary Care Janayla Marik: Carolann Littler Other Clinician: Referring Russ Looper: Treating Summerlyn Fickel/Extender: [name] in Treatment: 8 Visit Information History Since Last Visit Added or deleted any medications: No Patient Arrived: [name] Any new allergies or adverse reactions: No Arrival Time: 10:54 Had a fall or experienced change in No Accompanied By: self activities of daily living that may affect Transfer Assistance: Manual risk of falls: Patient Identification Verified: Yes Signs or symptoms of abuse/neglect since last visito No Secondary Verification Process Completed: Yes Hospitalized since last visit: No Patient Requires Transmission-Based Precautions: No Implantable device outside of the clinic excluding No Patient Has Alerts: No cellular tissue based products placed in the center since last visit: Has Dressing in Place as Prescribed: Yes Has Compression in Place as Prescribed: Yes Pain Present Now: No Electronic Signature(s) Signed: 10/05/2022 3:53:03 PM By: Adline Peals Entered By: Adline Peals on 10/05/2022 10:54:44 -------------------------------------------------------------------------------- Clinic Level of Care Assessment Details Patient Name: Date of Service: [name] [name] [name] 10/05/2022 10:45 A M Medical Record Number: [id number] Patient Account Number: [id number] Date of Birth/Sex: Treating RN: 1941-09-16 (82 y.o. [name] Primary Care Clementina Mareno: Carolann Littler Other Clinician: Referring Zadia Uhde: Treating Diona Peregoy/Extender: [name] in Treatment: 8 Clinic Level of Care Assessment Items TOOL 4 Quantity Score X- 1 0 Use when only an EandM is performed on FOLLOW-UP visit ASSESSMENTS - Nursing Assessment / Reassessment X- 1 10 Reassessment of Co-morbidities (includes updates in patient status) X- 1 5 Reassessment of Adherence to Treatment Plan ASSESSMENTS - Wound and Skin A ssessment / Reassessment X - Simple Wound Assessment / Reassessment - one wound 1 5 '[]'$  - 0 Complex Wound Assessment / Reassessment - multiple wounds '[]'$  - 0 Dermatologic / Skin Assessment (not related to wound area) ASSESSMENTS - Focused Assessment X- 1 5 Circumferential Edema Measurements - multi extremities '[]'$  - 0 Nutritional Assessment / Counseling / Intervention JALISHA, ENNEKING ([id number]) [id number] Page 2 of 9 X- 1 5 Lower Extremity Assessment (monofilament, tuning fork, pulses) '[]'$  - 0 Peripheral Arterial Disease Assessment (using hand held doppler) ASSESSMENTS - Ostomy and/or Continence Assessment and Care '[]'$  - 0 Incontinence Assessment and Management '[]'$  - 0 Ostomy Care Assessment and Management (repouching, etc.) PROCESS - Coordination of Care X - Simple Patient / Family Education for ongoing care 1 15 '[]'$  - 0 Complex (extensive) Patient / Family Education for ongoing care X- 1 10 Staff obtains Programmer, systems, Records, T Results / Process Orders est '[]'$  - 0 Staff telephones HHA, Nursing Homes / Clarify orders / etc '[]'$  - 0 Routine Transfer to another Facility (non-emergent condition) '[]'$  - 0 Routine Hospital Admission (non-emergent condition) '[]'$  - 0 New Admissions / Biomedical engineer / Ordering NPWT Apligraf, etc. , '[]'$  - 0 Emergency Hospital Admission (emergent condition) X- 1 10 Simple Discharge Coordination '[]'$  - 0 Complex (extensive) Discharge Coordination PROCESS - Special Needs '[]'$  - 0 Pediatric / Minor Patient Management '[]'$  - 0 Isolation Patient Management '[]'$  - 0 Hearing  / Language / Visual special needs '[]'$  - 0 Assessment of Community assistance (transportation, D/C planning, etc.) '[]'$  - 0 Additional assistance / Altered mentation '[]'$  - 0 Support Surface(s) Assessment (bed, cushion,  seat, etc.) INTERVENTIONS - Wound Cleansing / Measurement X - Simple Wound Cleansing - one wound 1 5 '[]'$  - 0 Complex Wound Cleansing - multiple wounds X- 1 5 Wound Imaging (photographs - any number of wounds) '[]'$  - 0 Wound Tracing (instead of photographs) X- 1 5 Simple Wound Measurement - one wound '[]'$  - 0 Complex Wound Measurement - multiple wounds INTERVENTIONS - Wound Dressings X - Small Wound Dressing one or multiple wounds 1 10 '[]'$  - 0 Medium Wound Dressing one or multiple wounds '[]'$  - 0 Large Wound Dressing one or multiple wounds '[]'$  - 0 Application of Medications - topical '[]'$  - 0 Application of Medications - injection INTERVENTIONS - Miscellaneous '[]'$  - 0 External ear exam '[]'$  - 0 Specimen Collection (cultures, biopsies, blood, body fluids, etc.) '[]'$  - 0 Specimen(s) / Culture(s) sent or taken to Lab for analysis '[]'$  - 0 Patient Transfer (multiple staff / Civil Service fast streamer / Similar devices) '[]'$  - 0 Simple Staple / Suture removal (25 or less) '[]'$  - 0 Complex Staple / Suture removal (26 or more) '[]'$  - 0 Hypo / Hyperglycemic Management (close monitor of Blood Glucose) [name], [name] ([id number]) [id number] Page 3 of 9 '[]'$  - 0 Ankle / Brachial Index (ABI) - do not check if billed separately X- 1 5 Vital Signs Has the patient been seen at the hospital within the last three years: Yes Total Score: 95 Level Of Care: New/Established - Level 3 Electronic Signature(s) Signed: 10/05/2022 3:53:03 PM By: Adline Peals Entered By: Adline Peals on 10/05/2022 11:28:03 -------------------------------------------------------------------------------- Encounter Discharge Information Details Patient Name: Date of Service: [name], [place] [name] [name]  10/05/2022 10:45 A M Medical Record Number: [id number] Patient Account Number: [id number] Date of Birth/Sex: Treating RN: [birth year] (82 y.o. [name] Primary Care Ciara Kagan: Carolann Littler Other Clinician: Referring Safa Derner: Treating Chrishonda Hesch/Extender: [name] in Treatment: 8 Encounter Discharge Information Items Discharge Condition: Stable Ambulatory Status: Walker Discharge Destination: Home Transportation: Private Auto Accompanied By: self Schedule Follow-up Appointment: No Clinical Summary of Care: Patient Declined Electronic Signature(s) Signed: 10/05/2022 3:53:03 PM By: Adline Peals Entered By: Adline Peals on 10/05/2022 11:28:31 -------------------------------------------------------------------------------- Lower Extremity Assessment Details Patient Name: Date of Service: [name], [place] [name] [name] 10/05/2022 10:45 A M Medical Record Number: [id number] Patient Account Number: [id number] Date of Birth/Sex: Treating RN: 1941/09/12 (82 y.o. [name] Primary Care Brittanni Cariker: Carolann Littler Other Clinician: Referring Coltyn Hanning: Treating Anuar Walgren/Extender: [name] in Treatment: 8 Edema Assessment Assessed: [Left: No] [Right: No] Edema: [Left: Ye] [Right: s] Calf Left: Right: Point of Measurement: From Medial Instep 34 cm Ankle Left: Right: Point of Measurement: From Medial Instep 22 cm Vascular Assessment Left: [[id number] Page 4 of 9Right:] Pulses: Dorsalis Pedis Palpable: [[id number] Page 4 of 9Yes] Electronic Signature(s) Signed: 10/05/2022 3:53:03 PM By: Adline Peals Entered By: Adline Peals on 10/05/2022 11:02:50 -------------------------------------------------------------------------------- Multi Wound Chart Details Patient Name: Date of Service: [name], [place] [name] [name] 10/05/2022 10:45 A M Medical  Record Number: [id number] Patient Account Number: [id number] Date of Birth/Sex: Treating RN: 15-Jun-1941 (82 y.o. F) Primary Care Diona Peregoy: Carolann Littler Other Clinician: Referring Jaxsen Bernhart: Treating Eileen Kangas/Extender: [name] in Treatment: 8 Vital Signs Height(in): 61 Pulse(bpm): 114 Weight(lbs): 128 Blood Pressure(mmHg): 149/94 Body Mass Index(BMI): 24.2 Temperature(F): 98.2 Respiratory Rate(breaths/min): 16 [6:Photos:] [N/A:N/A] Left, Anterior Lower Leg Left, Plantar Foot N/A Wound Location: Trauma Gradually Appeared N/A Wounding Event: Venous Leg Ulcer Neuropathic Ulcer-Non Diabetic N/A Primary Etiology:  Cataracts, Hypertension, Rheumatoid Cataracts, Hypertension, Rheumatoid N/A Comorbid History: Arthritis, Seizure Disorder Arthritis, Seizure Disorder 07/08/2022 06/24/2022 N/A Date Acquired: 8 8 N/A Weeks of Treatment: Open Open N/A Wound Status: No No N/A Wound Recurrence: 0x0x0 0x0x0 N/A Measurements L x W x D (cm) 0 0 N/A A (cm) : rea 0 0 N/A Volume (cm) : 100.00% 100.00% N/A % Reduction in Area: 100.00% 100.00% N/A % Reduction in Volume: Full Thickness Without Exposed Full Thickness Without Exposed N/A Classification: Support Structures Support Structures None Present None Present N/A Exudate Amount: Flat and Intact Thickened N/A Wound Margin: None Present (0%) None Present (0%) N/A Granulation Amount: None Present (0%) None Present (0%) N/A Necrotic Amount: Fascia: No Fascia: No N/A Exposed Structures: Fat Layer (Subcutaneous Tissue): No Fat Layer (Subcutaneous Tissue): No Tendon: No Tendon: No Muscle: No Muscle: No Joint: No Joint: No Bone: No Bone: No Large (67-100%) Large (67-100%) N/A Epithelialization: Excoriation: No Callus: Yes N/A Periwound Skin Texture: Induration: No Callus: No Crepitus: No Rash: No Scarring: No Maceration: No No Abnormalities Noted N/A [address]TONNI, [name] ([id number]) [id number] Page 5 of 9 Dry/Scaly: No Hemosiderin Staining: Yes No Abnormalities Noted N/A Periwound Skin Color: Atrophie Blanche: No Cyanosis: No Ecchymosis: No Erythema: No Mottled: No Pallor: No Rubor: No No Abnormality No Abnormality N/A Temperature: Yes Yes N/A Tenderness on Palpation: Treatment Notes Electronic Signature(s) Signed: 10/05/2022 11:21:55 AM By: Fredirick Maudlin MD FACS Entered By: Fredirick Maudlin on 10/05/2022 11:21:55 -------------------------------------------------------------------------------- Multi-Disciplinary Care Plan Details Patient Name: Date of Service: [name], [place] [name] [name] 10/05/2022 10:45 A M Medical Record Number: [id number] Patient Account Number: [id number] Date of Birth/Sex: Treating RN: 1941/09/22 (82 y.o. [name] Primary Care Terald Jump: Carolann Littler Other Clinician: Referring Jarica Plass: Treating Jeanluc Wegman/Extender: [name] in Treatment: 8 Multidisciplinary Care Plan reviewed with physician Active Inactive Electronic Signature(s) Signed: 10/05/2022 3:53:03 PM By: Sabas Sous By: Adline Peals on 10/05/2022 11:27:39 -------------------------------------------------------------------------------- Pain Assessment Details Patient Name: Date of Service: [name], [place] [name] [name] 10/05/2022 10:45 A M Medical Record Number: [id number] Patient Account Number: [id number] Date of Birth/Sex: Treating RN: 1941-08-21 (82 y.o. [name] Primary Care Gaelyn Tukes: Carolann Littler Other Clinician: Referring Lawana Hartzell: Treating Prisha Hiley/Extender: [name] in Treatment: 8 Active Problems Location of Pain Severity and Description of Pain Patient Has Paino No Site Locations Rate the pain. [name], [name] ([id number]) [id number] Page 6 of 9 Rate the  pain. Current Pain Level: 0 Pain Management and Medication Current Pain Management: Electronic Signature(s) Signed: 10/05/2022 3:53:03 PM By: Adline Peals Entered By: Adline Peals on 10/05/2022 10:54:54 -------------------------------------------------------------------------------- Patient/Caregiver Education Details Patient Name: Date of Service: [name] [name] [name] 1/12/2024andnbsp10:45 A M Medical Record Number: [id number] Patient Account Number: [id number] Date of Birth/Gender: Treating RN: 02/05/1941 (82 y.o. [name] Primary Care Physician: Carolann Littler Other Clinician: Referring Physician: Treating Physician/Extender: [name] in Treatment: 8 Education Assessment Education Provided To: Patient Education Topics Provided Wound/Skin Impairment: Methods: Explain/Verbal Responses: Reinforcements needed, State content correctly Electronic Signature(s) Signed: 10/05/2022 3:53:03 PM By: Adline Peals Entered By: Adline Peals on 10/05/2022 10:55:17 -------------------------------------------------------------------------------- Wound Assessment Details Patient Name: Date of Service: [name], [place] [name] [name] 10/05/2022 10:45 A M Medical Record Number: [id number] Patient Account Number: [id number] Date of Birth/Sex: Treating RN: [birth year] (82 y.o. [name] Primary Care Kirk Sampley: Carolann Littler Other Clinician: Referring Neoma Uhrich: Treating Rey Dansby/Extender: Justice Deeds  [name], [name] ([id number]) [id number] Page 7 of 9 Weeks in Treatment: 8 Wound Status Wound Number: 6 Primary Venous Leg Ulcer Etiology: Wound Location: Left, Anterior Lower Leg Wound Status: Open Wounding Event: Trauma Comorbid Cataracts, Hypertension, Rheumatoid Arthritis, Seizure Date Acquired: 07/08/2022 History: Disorder Weeks Of Treatment: 8 Clustered Wound:  No Photos Wound Measurements Length: (cm) Width: (cm) Depth: (cm) Area: (cm) Volume: (cm) 0 % Reduction in Area: 100% 0 % Reduction in Volume: 100% 0 Epithelialization: Large (67-100%) 0 Tunneling: No 0 Undermining: No Wound Description Classification: Full Thickness Without Exposed Support Structures Wound Margin: Flat and Intact Exudate Amount: None Present Foul Odor After Cleansing: No Slough/Fibrino No Wound Bed Granulation Amount: None Present (0%) Exposed Structure Necrotic Amount: None Present (0%) Fascia Exposed: No Fat Layer (Subcutaneous Tissue) Exposed: No Tendon Exposed: No Muscle Exposed: No Joint Exposed: No Bone Exposed: No Periwound Skin Texture Texture Color No Abnormalities Noted: Yes No Abnormalities Noted: No Atrophie Blanche: No Moisture Cyanosis: No No Abnormalities Noted: Yes Ecchymosis: No Erythema: No Hemosiderin Staining: Yes Mottled: No Pallor: No Rubor: No Temperature / Pain Temperature: No Abnormality Tenderness on Palpation: Yes Electronic Signature(s) Signed: 10/05/2022 3:53:03 PM By: Adline Peals Entered By: Adline Peals on 10/05/2022 11:07:32 [name] ([id number]) [id number] Page 8 of 9 -------------------------------------------------------------------------------- Wound Assessment Details Patient Name: Date of Service: [name], [place] [name] [name] 10/05/2022 10:45 A M Medical Record Number: [id number] Patient Account Number: [id number] Date of Birth/Sex: Treating RN: May 05, 1941 (82 y.o. [name] Primary Care Wyllow Seigler: Carolann Littler Other Clinician: Referring Vanessa Kampf: Treating Kandice Schmelter/Extender: [name] in Treatment: 8 Wound Status Wound Number: 7 Primary Neuropathic Ulcer-Non Diabetic Etiology: Wound Location: Left, Plantar Foot Wound Status: Open Wounding Event: Gradually Appeared Comorbid Cataracts, Hypertension, Rheumatoid  Arthritis, Seizure Date Acquired: 06/24/2022 History: Disorder Weeks Of Treatment: 8 Clustered Wound: No Photos Wound Measurements Length: (cm) Width: (cm) Depth: (cm) Area: (cm) Volume: (cm) 0 % Reduction in Area: 100% 0 % Reduction in Volume: 100% 0 Epithelialization: Large (67-100%) 0 Tunneling: No 0 Undermining: No Wound Description Classification: Full Thickness Without Exposed Support Structures Wound Margin: Thickened Exudate Amount: None Present Foul Odor After Cleansing: No Slough/Fibrino No Wound Bed Granulation Amount: None Present (0%) Exposed Structure Necrotic Amount: None Present (0%) Fascia Exposed: No Fat Layer (Subcutaneous Tissue) Exposed: No Tendon Exposed: No Muscle Exposed: No Joint Exposed: No Bone Exposed: No Periwound Skin Texture Texture Color No Abnormalities Noted: No No Abnormalities Noted: Yes Callus: Yes Temperature / Pain Temperature: No Abnormality Moisture No Abnormalities Noted: Yes Tenderness on Palpation: Yes Electronic Signature(s) Signed: 10/05/2022 3:53:03 PM By: Adline Peals Entered By: Adline Peals on 10/05/2022 11:09:18 [name] ([id number]) [id number] Page 9 of 9 -------------------------------------------------------------------------------- Vitals Details Patient Name: Date of Service: [name], [place] [name] [name] 10/05/2022 10:45 A M Medical Record Number: [id number] Patient Account Number: [id number] Date of Birth/Sex: Treating RN: Oct 18, 1940 (82 y.o. [name] Primary Care Miki Blank: Carolann Littler Other Clinician: Referring Armando Bukhari: Treating Praise Dolecki/Extender: [name] in Treatment: 8 Vital Signs Time Taken: 10:56 Temperature (F): 98.2 Height (in): 61 Pulse (bpm): 114 Weight (lbs): 128 Respiratory Rate (breaths/min): 16 Body Mass Index (BMI): 24.2 Blood Pressure (mmHg): 149/94 Reference Range: 80 - 120 mg /  dl Electronic Signature(s) Signed: 10/05/2022 3:53:03 PM By: Adline Peals Entered By: Adline Peals on 10/05/2022 10:58:04

## 2022-10-05 NOTE — Progress Notes (Signed)
[name], [name] ([id number]) [id number] Page 1 of 8 Visit Report for 10/05/2022 Chief Complaint Document Details Patient Name: Date of Service: [name], [place] [name]. 10/05/2022 10:45 A M Medical Record Number: [id number] Patient Account Number: [id number] Date of Birth/Sex: Treating RN: 03/18/1941 (82 y.o. F) Primary Care Provider: Carolann Littler Other Clinician: Referring Provider: Treating Provider/Extender: [name] in Treatment: 8 Information Obtained from: Patient Chief Complaint Patient seen for complaints of Non-Healing Wound. Electronic Signature(s) Signed: 10/05/2022 11:22:01 AM By: Fredirick Maudlin MD FACS Entered By: Fredirick Maudlin on 10/05/2022 11:22:01 -------------------------------------------------------------------------------- HPI Details Patient Name: Date of Service: [name], [name] [name]. 10/05/2022 10:45 A M Medical Record Number: [id number] Patient Account Number: [id number] Date of Birth/Sex: Treating RN: May 19, 1941 (82 y.o. F) Primary Care Provider: Carolann Littler Other Clinician: Referring Provider: Treating Provider/Extender: [name] in Treatment: 8 History of Present Illness HPI Description: ADMISSION 11.14.2023 This is an 82 year old woman on chronic steroids and anticoagulants who fell near the beginning of October. She initially developed a hematoma on her left lower leg which subsequently ruptured. She presented to the emergency department on November 6 with concern for a wound infection due to developing erythema and warmth. The wound was debrided in the ED and clindamycin prescribed along with a recommendation for probiotics. Wet-to-dry dressings were prescribed, as well. She was seen again in the emergency room 3 days later for other symptoms. Photographs in the electronic medical record show that the wound is quite a bit cleaner. Wound care was  consulted at that time. They recommended daily soap and water cleanse and Xeroform topped with an ABD and a Kerlix and Ace wrap. Upon her discharge from the hospital, she was referred to the wound care center for further evaluation and management. ABI in clinic was 1.25. On intake, she was also noted to have an ulcer on her left midfoot associated with a fallen arch and callus. On her left anterior tibial surface, there is a large ovoid wound with some evidence of epithelialization. There is granulation tissue and slough present. No concern for infection. On the left midfoot, there is a large callus, in the center of which there is an ulceration exposing the fat layer. No malodor or purulent drainage. 08/14/2022: The left anterior tibial wound is smaller and cleaner. There is more epithelialization present. There is some slough accumulation on the surface. When her dressing was removed from the midfoot wound, pus drained out. The patient did say she had been having quite a bit more pain in her foot during the week. 08/21/2022: The culture that I took last week grew out very low levels of just some skin flora. The patient filled but did not take the antibiotic that I prescribed, as her primary care doctor put her on doxycycline for an upper respiratory infection. The anterior tibial wound is looking good. There is robust granulation tissue with a light layer of slough on the surface. The plantar midfoot wound has accumulated a thick callus once again. The opening in the skin is quite small. No purulent drainage present. 08/29/2022: The anterior tibial wound continues to contract. There is minimal slough and there has been greater epithelialization. She does have a bit of hypertrophic granulation tissue present. The plantar midfoot wound is once again covered and callus. The opening in the skin is basically unchanged. No concern for infection. 12/13; thick eschar on the anterior tibial wound. The wound  on her left foot has  healed. This is in the medial part of the foot. She has thick hemorrhagic callus but no open wound in this area. 09/27/2022: The anterior tibial wound is nearly healed with just a couple of tiny open areas underlying some very thin eschar. The medial plantar foot wound is covered with a thick layer of callus, once this was removed, a tiny open area was appreciated. [name], [name] ([id number]) [id number] Page 2 of 8 10/05/2022: Both wounds are healed. Electronic Signature(s) Signed: 10/05/2022 11:22:25 AM By: Fredirick Maudlin MD FACS Entered By: Fredirick Maudlin on 10/05/2022 11:22:25 -------------------------------------------------------------------------------- Physical Exam Details Patient Name: Date of Service: [name], [name] [name]. 10/05/2022 10:45 A M Medical Record Number: [id number] Patient Account Number: [id number] Date of Birth/Sex: Treating RN: 01-Nov-1940 (82 y.o. F) Primary Care Provider: Carolann Littler Other Clinician: Referring Provider: Treating Provider/Extender: [name] in Treatment: 8 Constitutional Slightly hypertensive. Tachycardic, asymptomatic. . . no acute distress. Respiratory Normal work of breathing on room air. Notes 10/05/2022: Her wounds are healed. Electronic Signature(s) Signed: 10/05/2022 11:23:20 AM By: Fredirick Maudlin MD FACS Entered By: Fredirick Maudlin on 10/05/2022 11:23:20 -------------------------------------------------------------------------------- Physician Orders Details Patient Name: Date of Service: [name], [name] [name]. 10/05/2022 10:45 A M Medical Record Number: [id number] Patient Account Number: [id number] Date of Birth/Sex: Treating RN: [birth year] (82 y.o. [name] Primary Care Provider: Carolann Littler Other Clinician: Referring Provider: Treating Provider/Extender: [name] in Treatment: 8 Verbal /  Phone Orders: No Diagnosis Coding ICD-10 Coding Code Description [phone] Non-pressure chronic ulcer of other part of left lower leg with fat layer exposed L97.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 G63 Polyneuropathy in diseases classified elsewhere N18.2 Chronic kidney disease, stage 2 (mild) M06.9 Rheumatoid arthritis, unspecified I10 Essential (primary) hypertension Z79.52 Long term (current) use of systemic steroids Discharge From [facility] Discharge from [place]!!!!!!!!! Edema Control - Lymphedema / SCD / [name], [name] ([id number]) [id number] Page 3 of 8 Elevate legs to the level of the heart or above for 30 minutes daily and/or when sitting for 3-4 times a day throughout the day. Avoid standing for long periods of time. Patient to wear own compression stockings every day. - right leg Exercise regularly Electronic Signature(s) Signed: 10/05/2022 11:23:33 AM By: Fredirick Maudlin MD FACS Entered By: Fredirick Maudlin on 10/05/2022 11:23:33 -------------------------------------------------------------------------------- Problem List Details Patient Name: Date of Service: [name], [name] [name]. 10/05/2022 10:45 A M Medical Record Number: [id number] Patient Account Number: [id number] Date of Birth/Sex: Treating RN: November 29, 1940 (82 y.o. F) Primary Care Provider: Carolann Littler Other Clinician: Referring Provider: Treating Provider/Extender: [name] in Treatment: 8 Active Problems ICD-10 Encounter Code Description Active Date MDM Diagnosis [phone] Non-pressure chronic ulcer of other part of left lower leg with fat layer exposed11/14/2023 No Yes L97.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 08/07/2022 No Yes G63 Polyneuropathy in diseases classified elsewhere 08/07/2022 No Yes N18.2 Chronic kidney disease,  stage 2 (mild) 08/07/2022 No Yes M06.9 Rheumatoid arthritis, unspecified 08/07/2022 No Yes I10 Essential (primary) hypertension 08/07/2022 No Yes Z79.52 Long term (current) use of systemic steroids 08/07/2022 No Yes Inactive Problems Resolved Problems Electronic Signature(s) Signed: 10/05/2022 11:21:48 AM By: Fredirick Maudlin MD FACS Entered By: Fredirick Maudlin on 10/05/2022 11:21:47 [name] ([id number]) [id number] Page 4 of 8 -------------------------------------------------------------------------------- Progress Note Details Patient Name: Date of Service: [name] RLO  [name], [name] [name]. 10/05/2022 10:45 A M Medical Record Number: [id number] Patient Account Number: [id number] Date of Birth/Sex: Treating RN: 04-Apr-1941 (82 y.o. F) Primary Care Provider: Carolann Littler Other Clinician: Referring Provider: Treating Provider/Extender: [name] in Treatment: 8 Subjective Chief Complaint Information obtained from Patient Patient seen for complaints of Non-Healing Wound. History of Present Illness (HPI) ADMISSION 11.14.2023 This is an 82 year old woman on chronic steroids and anticoagulants who fell near the beginning of October. She initially developed a hematoma on her left lower leg which subsequently ruptured. She presented to the emergency department on November 6 with concern for a wound infection due to developing erythema and warmth. The wound was debrided in the ED and clindamycin prescribed along with a recommendation for probiotics. Wet-to-dry dressings were prescribed, as well. She was seen again in the emergency room 3 days later for other symptoms. Photographs in the electronic medical record show that the wound is quite a bit cleaner. Wound care was consulted at that time. They recommended daily soap and water cleanse and Xeroform topped with an ABD and a Kerlix and Ace wrap. Upon her discharge from the hospital,  she was referred to the wound care center for further evaluation and management. ABI in clinic was 1.25. On intake, she was also noted to have an ulcer on her left midfoot associated with a fallen arch and callus. On her left anterior tibial surface, there is a large ovoid wound with some evidence of epithelialization. There is granulation tissue and slough present. No concern for infection. On the left midfoot, there is a large callus, in the center of which there is an ulceration exposing the fat layer. No malodor or purulent drainage. 08/14/2022: The left anterior tibial wound is smaller and cleaner. There is more epithelialization present. There is some slough accumulation on the surface. When her dressing was removed from the midfoot wound, pus drained out. The patient did say she had been having quite a bit more pain in her foot during the week. 08/21/2022: The culture that I took last week grew out very low levels of just some skin flora. The patient filled but did not take the antibiotic that I prescribed, as her primary care doctor put her on doxycycline for an upper respiratory infection. The anterior tibial wound is looking good. There is robust granulation tissue with a light layer of slough on the surface. The plantar midfoot wound has accumulated a thick callus once again. The opening in the skin is quite small. No purulent drainage present. 08/29/2022: The anterior tibial wound continues to contract. There is minimal slough and there has been greater epithelialization. She does have a bit of hypertrophic granulation tissue present. The plantar midfoot wound is once again covered and callus. The opening in the skin is basically unchanged. No concern for infection. 12/13; thick eschar on the anterior tibial wound. The wound on her left foot has healed. This is in the medial part of the foot. She has thick hemorrhagic callus but no open wound in this area. 09/27/2022: The anterior tibial  wound is nearly healed with just a couple of tiny open areas underlying some very thin eschar. The medial plantar foot wound is covered with a thick layer of callus, once this was removed, a tiny open area was appreciated. 10/05/2022: Both wounds are healed. Patient History Information obtained from Patient, Chart. Family History Cancer - Mother,Siblings,Maternal Grandparents, Hypertension - Father, Seizures - Mother, Stroke - Mother, No family history of  Diabetes, Heart Disease, Hereditary Spherocytosis, Kidney Disease, Lung Disease, Thyroid Problems, Tuberculosis. Social History Never smoker, Marital Status - Widowed, Alcohol Use - Rarely, Drug Use - No History, Caffeine Use - Daily - tea. Medical History Eyes Patient has history of Cataracts - bil extracted Denies history of Glaucoma, Optic Neuritis Cardiovascular Patient has history of Hypertension Genitourinary Denies history of End Stage Renal Disease Integumentary (Skin) Denies history of History of Burn Musculoskeletal Patient has history of Rheumatoid Arthritis Neurologic Patient has history of Seizure Disorder - hx Oncologic Denies history of Received Chemotherapy, Received Radiation Psychiatric Denies history of Anorexia/bulimia, Confinement Anxiety Medical A Surgical History Notes nd [name], [name] ([id number]) [id number] Page 5 of 8 Respiratory hx pulmonary fibrosis Cardiovascular hx temporal arteritis, hyperlipidemia Gastrointestinal GERD, diverticulosis Genitourinary CRI stage 2 Musculoskeletal osteoporosis Neurologic TIA, CVA, chronic back pain Oncologic hx breast [name] Objective Constitutional Slightly hypertensive. Tachycardic, asymptomatic. no acute distress. Vitals Time Taken: 10:56 AM, Height: 61 in, Weight: 128 lbs, BMI: 24.2, Temperature: 98.2 F, Pulse: 114 bpm, Respiratory Rate: 16 breaths/min, Blood Pressure: 149/94 mmHg. Respiratory Normal work of breathing on  room air. General Notes: 10/05/2022: Her wounds are healed. Integumentary (Hair, Skin) Wound #6 status is Open. Original cause of wound was Trauma. The date acquired was: 07/08/2022. The wound has been in treatment 8 weeks. The wound is located on the Left,Anterior Lower Leg. The wound measures 0cm length x 0cm width x 0cm depth; 0cm^2 area and 0cm^3 volume. There is no tunneling or undermining noted. There is a none present amount of drainage noted. The wound margin is flat and intact. There is no granulation within the wound bed. There is no necrotic tissue within the wound bed. The periwound skin appearance had no abnormalities noted for texture. The periwound skin appearance had no abnormalities noted for moisture. The periwound skin appearance exhibited: Hemosiderin Staining. The periwound skin appearance did not exhibit: Atrophie Blanche, Cyanosis, Ecchymosis, Mottled, Pallor, Rubor, Erythema. Periwound temperature was noted as No Abnormality. The periwound has tenderness on palpation. Wound #7 status is Open. Original cause of wound was Gradually Appeared. The date acquired was: 06/24/2022. The wound has been in treatment 8 weeks. The wound is located on the [place]. The wound measures 0cm length x 0cm width x 0cm depth; 0cm^2 area and 0cm^3 volume. There is no tunneling or undermining noted. There is a none present amount of drainage noted. The wound margin is thickened. There is no granulation within the wound bed. There is no necrotic tissue within the wound bed. The periwound skin appearance had no abnormalities noted for moisture. The periwound skin appearance had no abnormalities noted for color. The periwound skin appearance exhibited: Callus. Periwound temperature was noted as No Abnormality. The periwound has tenderness on palpation. Assessment Active Problems ICD-10 Non-pressure chronic ulcer of other part of left lower leg with fat layer exposed Non-pressure chronic  ulcer of left heel and midfoot with fat layer exposed Polyneuropathy in diseases classified elsewhere Chronic kidney disease, stage 2 (mild) Rheumatoid arthritis, unspecified Essential (primary) hypertension Long term (current) use of systemic steroids Plan Discharge From [facility] Services: Discharge from [place]!!!!!!!!! Edema Control - Lymphedema / SCD / Other: Elevate legs to the level of the heart or above for 30 minutes daily and/or when sitting for 3-4 times a day throughout the day. Avoid standing for long periods of time. Patient to wear own compression stockings every day. - right leg Exercise regularly [name], [name] ([id number]) [id number] Page 6  of 8 10/05/2022: Her wounds are healed. I recommended that she continue to use a foam donut to take pressure off the bony prominence in her foot. She is going to see her podiatrist to discuss surgical intervention in this location. I recommended that she wear compression stockings and keep her legs elevated throughout the day and at night. We will discharge her from the wound care center. She may follow-up as needed. Electronic Signature(s) Signed: 10/05/2022 11:25:02 AM By: Fredirick Maudlin MD FACS Entered By: Fredirick Maudlin on 10/05/2022 11:25:01 -------------------------------------------------------------------------------- HxROS Details Patient Name: Date of Service: [name], [name] [name]. 10/05/2022 10:45 A M Medical Record Number: [id number] Patient Account Number: [id number] Date of Birth/Sex: Treating RN: [birth year] (82 y.o. F) Primary Care Provider: Carolann Littler Other Clinician: Referring Provider: Treating Provider/Extender: [name] in Treatment: 8 Information Obtained From Patient Chart Eyes Medical History: Positive for: Cataracts - bil extracted Negative for: Glaucoma; Optic Neuritis Respiratory Medical History: Past  Medical History Notes: hx pulmonary fibrosis Cardiovascular Medical History: Positive for: Hypertension Past Medical History Notes: hx temporal arteritis, hyperlipidemia Gastrointestinal Medical History: Past Medical History Notes: GERD, diverticulosis Genitourinary Medical History: Negative for: End Stage Renal Disease Past Medical History Notes: CRI stage 2 Integumentary (Skin) Medical History: Negative for: History of Burn Musculoskeletal Medical History: Positive for: Rheumatoid Arthritis Past Medical History Notes: osteoporosis Neurologic Medical History: Positive for: Seizure Disorder [name], [name] ([id number]) [id number] Page 7 of 8 Past Medical History Notes: TIA, CVA, chronic back pain Oncologic Medical History: Negative for: Received Chemotherapy; Received Radiation Past Medical History Notes: hx breast [name] Psychiatric Medical History: Negative for: Anorexia/bulimia; Confinement Anxiety HBO Extended History Items Eyes: Cataracts Immunizations Pneumococcal Vaccine: Received Pneumococcal Vaccination: Yes Received Pneumococcal Vaccination On or After 60th Birthday: Yes Implantable Devices No devices added Family and Social History Cancer: Yes - Mother,Siblings,Maternal Grandparents; Diabetes: No; Heart Disease: No; Hereditary Spherocytosis: No; Hypertension: Yes - Father; Kidney Disease: No; Lung Disease: No; Seizures: Yes - Mother; Stroke: Yes - Mother; Thyroid Problems: No; Tuberculosis: No; Never smoker; Marital Status - Widowed; Alcohol Use: Rarely; Drug Use: No History; Caffeine Use: Daily - tea; Financial Concerns: No; Food, Clothing or Shelter Needs: No; Support System Lacking: No Electronic Signature(s) Signed: 10/05/2022 11:31:20 AM By: Fredirick Maudlin MD FACS Entered By: Fredirick Maudlin on 10/05/2022 11:22:44 -------------------------------------------------------------------------------- SuperBill  Details Patient Name: Date of Service: [name], [name] [name]. 10/05/2022 Medical Record Number: [id number] Patient Account Number: [id number] Date of Birth/Sex: Treating RN: [birth year] (81 y.o. F) Primary Care Provider: Carolann Littler Other Clinician: Referring Provider: Treating Provider/Extender: [name] in Treatment: 8 Diagnosis Coding ICD-10 Codes Code Description [phone] Non-pressure chronic ulcer of other part of left lower leg with fat layer exposed L97.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 G63 Polyneuropathy in diseases classified elsewhere N18.2 Chronic kidney disease, stage 2 (mild) M06.9 Rheumatoid arthritis, unspecified I10 Essential (primary) hypertension Z79.52 Long term (current) use of systemic steroids Facility Procedures Physician Procedures : CPT4 Code Description Modifier [id number] - WC PHYS LEVEL 3 - EST PT ICD-10 Diagnosis Description L97.822 Non-pressure chronic ulcer of other part of left lower leg with fat layer exposed L97.422 Non-pressure chronic ulcer of left heel and midfoot  with fat layer exposed Z79.52 Long term (current) use of systemic steroids G63 Polyneuropathy in diseases classified elsewhere Quantity: 1 Electronic Signature(s) Signed: 10/05/2022 11:31:20 AM By: Fredirick Maudlin MD FACS Signed: 10/05/2022 3:53:03  PM By: Adline Peals Previous Signature: 10/05/2022 11:25:20 AM Version By: Fredirick Maudlin MD FACS Entered By: Adline Peals on 10/05/2022 11:28:10

## 2022-10-09 ENCOUNTER — Telehealth: Payer: Self-pay

## 2022-10-09 NOTE — Telephone Encounter (Signed)
--  pt states she was seen in the hospital a month ago d/t tia (dizziness, syncope, slurred speech). has had a headache since saturday. takes baby aspirin daily and was told to take an extra one. yesterday it was ok, today it is back. dizziness on saturday and sunday. 7/10 pain constantly. pain decreased yesterday after the aspirin.  10/09/2022 9:08:29 AM See PCP within 24 Hours Altamease Oiler, RN, Adriana  Comments User: Kizzie Fantasia, RN Date/Time Eilene Ghazi Time): 10/09/2022 9:08:58 AM has appt scheduled for tomorrow  Referrals REFERRED TO PCP OFFICE

## 2022-10-10 ENCOUNTER — Ambulatory Visit (HOSPITAL_BASED_OUTPATIENT_CLINIC_OR_DEPARTMENT_OTHER): Payer: Medicare Other | Admitting: General Surgery

## 2022-10-10 ENCOUNTER — Ambulatory Visit: Payer: Medicare Other | Admitting: Family Medicine

## 2022-10-15 ENCOUNTER — Ambulatory Visit (INDEPENDENT_AMBULATORY_CARE_PROVIDER_SITE_OTHER): Payer: Medicare Other | Admitting: Family Medicine

## 2022-10-15 ENCOUNTER — Encounter: Payer: Self-pay | Admitting: Family Medicine

## 2022-10-15 VITALS — BP 150/78 | HR 82 | Temp 98.3°F | Wt 130.8 lb

## 2022-10-15 DIAGNOSIS — R519 Headache, unspecified: Secondary | ICD-10-CM

## 2022-10-15 DIAGNOSIS — I10 Essential (primary) hypertension: Secondary | ICD-10-CM | POA: Diagnosis not present

## 2022-10-15 NOTE — Progress Notes (Signed)
Established Patient Office Visit  Subjective   Patient ID: [name], female    DOB: Mar 05, 1941  Age: 82 y.o. MRN: [id number]  Chief Complaint  Patient presents with   Headache    Patient complains of headaches, x1 week, Tried Aspirin with little relief     HPI   [name] is seen with reported onset little over a week ago intermittent left-sided headache behind the left eye.  She states she started with typical cold-like symptoms.  Denies any throbbing headache.  No recent head injury.  She has had occasional nasal discharge but mostly clear.  No fever.  Has taken occasional aspirin which helps.  Denies any recent acute visual changes left eye.  She states she had temporal arteritis back in the 80s or 90s right eye and had significant loss of vision with that.  She is followed by neurology.  Still takes low-dose prednisone.  She had MRI brain and MR angiogram of the brain back in November which showed only microvascular changes but no acute large vessel disease.  She has history of hypertension takes amlodipine 5 mg daily.  She states her home blood pressures have been consistently well-controlled recently.  Past Medical History:  Diagnosis Date   Allergic rhinitis    Anemia    Anxiety    Bronchiectasis    Carpal tunnel syndrome 07/13/2015   Bilateral   CVA (cerebral infarction) 10/2007   Right thalamic    Diverticulosis of colon    Gait disorder    GERD (gastroesophageal reflux disease)    History of breast cancer    HTN (hypertension)    Hyperlipidemia    Idiopathic pulmonary fibrosis    Left knee DJD    Migraine    Osteoporosis    Peripheral neuropathy    PMR (polymyalgia rheumatica) (HCC)    Polyneuropathy in other diseases classified elsewhere ([place]) 02/17/2013   Previous back surgery 10/09/12   Renal insufficiency    Rheumatoid arthritis (HCC)    Seizure disorder (HCC)    Spondylosis, cervical, with myelopathy 08/31/2015   C3-4 myelopathy   Temporal arteritis  (HCC)    Right eye blind, on steroids per [facility]/ Dr [name]   Type II or unspecified type diabetes mellitus without mention of complication, not stated as uncontrolled    2nd to steriods   Vitamin D deficiency    Past Surgical History:  Procedure Laterality Date   APPENDECTOMY  01/2021   CARPAL TUNNEL RELEASE Right    CATARACT EXTRACTION     OS - Summer 2010   CERVICAL FUSION  09/24/2008   4 rods and pins in place   CHOLECYSTECTOMY     COLONOSCOPY  12/15/2011   Procedure: COLONOSCOPY;  Surgeon: [name], MD;  Location: [facility];  Service: Endoscopy;  Laterality: N/A;   LUMBAR FUSION  04/24/2010   W/Mechanical fixation - [facility]   LUMBAR FUSION  09/25/2011   rods in hips (to stabilize).   MASTECTOMY     NECK SURGERY     rheumatoid nodule removal     SPINAL FUSION  07/25/2010   T10-L2 interbody fusion / [place]     Left    reports that she has never smoked. She has never used smokeless tobacco. She reports that she does not currently use alcohol. She reports that she does not use drugs. family history includes Arthritis in her father and mother; Brain cancer in  her mother; Breast cancer in her sister; Dementia in her father; Hypertension in her father and mother; Lung cancer in her sister. Allergies  Allergen Reactions   Hydromorphone Other (See Comments)    Cognitive changes  "made me unconscious" Dilaudid    Review of Systems  Constitutional:  Negative for chills, fever and malaise/fatigue.  Eyes:  Negative for blurred vision.  Respiratory:  Negative for shortness of breath.   Cardiovascular:  Negative for chest pain.  Neurological:  Positive for headaches. Negative for dizziness and weakness.      Objective:     BP (!) 150/78 (BP Location: Right Arm, Patient Position: Sitting, Cuff Size: Normal)   Pulse 82   Temp 98.3 F (36.8 C) (Oral)   Wt 130 lb 12.8 oz (59.3 kg)   SpO2 99%   BMI  24.71 kg/m  BP Readings from Last 3 Encounters:  10/15/22 (!) 150/78  09/26/22 (!) 140/72  08/14/22 (!) 160/80   Wt Readings from Last 3 Encounters:  10/15/22 130 lb 12.8 oz (59.3 kg)  09/26/22 132 lb 9.6 oz (60.1 kg)  08/14/22 132 lb 3.2 oz (60 kg)      Physical Exam Vitals reviewed.  Constitutional:      Appearance: She is well-developed.  Eyes:     Extraocular Movements: Extraocular movements intact.     Pupils: Pupils are equal, round, and reactive to light.  Cardiovascular:     Rate and Rhythm: Normal rate and regular rhythm.  Pulmonary:     Effort: Pulmonary effort is normal.     Breath sounds: Normal breath sounds.  Neurological:     Mental Status: She is alert.     Cranial Nerves: No cranial nerve deficit, dysarthria or facial asymmetry.     Motor: No weakness.      No results found for any visits on 10/15/22.    The ASCVD Risk score (Arnett DK, et al., 2019) failed to calculate for the following reasons:   The 2019 ASCVD risk score is only valid for ages 24 to 57    Assessment & Plan:   #1 unilateral headache intermittently over the past week.  Location is left eye.  Denies any visual changes.  Remote history of temporal arteritis and patient still on low-dose prednisone.  Recheck sed rate.  Follow-up immediately for any visual changes or progressive headache  #2 hypertension.  Slightly up today.  She states that her blood pressure always is up when she first gets here.  She thinks the effort of walking back elevates this. Continue low-sodium diet.  Continue close home monitoring.  Be in touch if consistently greater than 798 systolic   No follow-ups on file.    Carolann Littler, MD

## 2022-10-16 LAB — SEDIMENTATION RATE: Sed Rate: 44 mm/hr — ABNORMAL HIGH (ref 0–30)

## 2022-10-17 ENCOUNTER — Ambulatory Visit: Payer: Self-pay | Admitting: Licensed Clinical Social Worker

## 2022-10-22 ENCOUNTER — Other Ambulatory Visit: Payer: Self-pay | Admitting: Family Medicine

## 2022-10-23 DIAGNOSIS — L918 Other hypertrophic disorders of the skin: Secondary | ICD-10-CM | POA: Diagnosis not present

## 2022-10-23 DIAGNOSIS — L570 Actinic keratosis: Secondary | ICD-10-CM | POA: Diagnosis not present

## 2022-10-23 DIAGNOSIS — L821 Other seborrheic keratosis: Secondary | ICD-10-CM | POA: Diagnosis not present

## 2022-10-23 DIAGNOSIS — L853 Xerosis cutis: Secondary | ICD-10-CM | POA: Diagnosis not present

## 2022-10-23 DIAGNOSIS — L820 Inflamed seborrheic keratosis: Secondary | ICD-10-CM | POA: Diagnosis not present

## 2022-10-25 DIAGNOSIS — Z1231 Encounter for screening mammogram for malignant neoplasm of breast: Secondary | ICD-10-CM | POA: Diagnosis not present

## 2022-10-25 LAB — HM MAMMOGRAPHY

## 2022-10-26 ENCOUNTER — Ambulatory Visit: Payer: Medicare Other | Admitting: Family Medicine

## 2022-10-26 NOTE — Patient Instructions (Signed)
Visit Information  Thank you for taking time to visit with me today. Please don't hesitate to contact me if I can be of assistance to you.   Following are the goals we discussed today:   Goals Addressed             This Visit's Progress    LCSW-Increase activity outside of home/Strengthen Support System   On track    Care Coordination Interventions: Solution-Focused Strategies employed:  Active listening / Reflection utilized  Emotional Support Provided Verbalization of feelings encouraged  Patient is visiting with wound care once a week. Leg is healing well. Focusing on bottom on foot; however, it's getting better Pt reports compliance with medications, which are mailed to residence Pt calls monthly for Oxycontin to [facility]. They encouraged pt to have PCP call meds in early to prevent delay in pt receiving through mail. Pt reports a 2-3 day delay in obtaining meds negatively impacting pain management Patient continues to practice gratitude thinking and is appreciative for support from medical staff Validation and encouragement provided Participating in PT once weekly in the home, feels it is helpful. Had a visit from social worker once Friend from [place] visits weekly and takes her to the appt and provide support during appts to remind her. Explained benefits of having that type of support         If you are experiencing a Mental Health or [place] or need someone to talk to, please call the Suicide and Crisis Lifeline: 988 call 911   Patient verbalizes understanding of instructions and care plan provided today and agrees to view in [place]. Active MyChart status and patient understanding of how to access instructions and care plan via MyChart confirmed with patient.     No further follow up required:    Christa See, MSW, [email or web address] Phone [phone] 4:26 AM

## 2022-10-26 NOTE — Patient Outreach (Signed)
  Care Coordination Late Entry  Follow Up Visit Note   Outreach completed 10/17/22 Name: [name] MRN: [id number] DOB: [birth year]  [name] is a 82 y.o. year old female who sees Burchette, Alinda Sierras, MD for primary care. I spoke with  [name] by phone today.  What matters to the patients health and wellness today?  Care Coordination    Goals Addressed             This Visit's Progress    LCSW-Increase activity outside of home/Strengthen Support System   On track    Care Coordination Interventions: Solution-Focused Strategies employed:  Active listening / Reflection utilized  Emotional Support Provided Verbalization of feelings encouraged  Patient is visiting with wound care once a week. Leg is healing well. Focusing on bottom on foot; however, it's getting better Pt reports compliance with medications, which are mailed to residence Pt calls monthly for Oxycontin to [facility]. They encouraged pt to have PCP call meds in early to prevent delay in pt receiving through mail. Pt reports a 2-3 day delay in obtaining meds negatively impacting pain management Patient continues to practice gratitude thinking and is appreciative for support from medical staff Validation and encouragement provided Participating in PT once weekly in the home, feels it is helpful. Had a visit from social worker once Friend from [place] visits weekly and takes her to the appt and provide support during appts to remind her. Explained benefits of having that type of support         SDOH assessments and interventions completed:  No     Care Coordination Interventions:  Yes, provided   Follow up plan: No further intervention required.   Encounter Outcome:  Pt. Visit Completed   [name], MSW, [name] Phone [phone] 4:26 AM

## 2022-10-29 ENCOUNTER — Ambulatory Visit (INDEPENDENT_AMBULATORY_CARE_PROVIDER_SITE_OTHER): Payer: Medicare Other | Admitting: Podiatry

## 2022-10-29 ENCOUNTER — Encounter: Payer: Self-pay | Admitting: Podiatry

## 2022-10-29 DIAGNOSIS — M778 Other enthesopathies, not elsewhere classified: Secondary | ICD-10-CM

## 2022-10-29 DIAGNOSIS — M898X7 Other specified disorders of bone, ankle and foot: Secondary | ICD-10-CM

## 2022-10-30 DIAGNOSIS — M0589 Other rheumatoid arthritis with rheumatoid factor of multiple sites: Secondary | ICD-10-CM | POA: Diagnosis not present

## 2022-10-30 NOTE — Progress Notes (Signed)
Subjective:   Patient ID: [name], female   DOB: 82 y.o.   MRN: [id number]   HPI Patient continues to complain of pain in the plantar aspect of the left foot and states that the debridement and medication is only giving her short-term relief at the current time.  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  Patient wants to pursue this approach I went ahead today I allowed her to read consent form going over alternative treatments complications associated with this she is willing to accept risk wants surgery and after review signed consent form.  Patient scheduled outpatient surgery with all questions answered today I do not want the arch to collapse further and I did dispense air fracture walker that I want her to start wearing now so it does not breakdown prior to procedure and it was fitted properly to her lower leg and she will wear it after surgery.  All questions answered along with risk discussed

## 2022-11-02 ENCOUNTER — Telehealth: Payer: Self-pay | Admitting: Family Medicine

## 2022-11-02 MED ORDER — OXYCODONE HCL ER 10 MG PO T12A: 10.0000 mg | EXTENDED_RELEASE_TABLET | Freq: Two times a day (BID) | ORAL | 0 refills | Status: DC

## 2022-11-02 NOTE — Telephone Encounter (Signed)
Prescription Request  11/02/2022  Is this a "Controlled Substance" medicine? Yes  LOV: 10/15/2022  What is the name of the medication or equipment? oxycontin  Have you contacted your pharmacy to request a refill? Yes  Which pharmacy would you like this sent to?  [facility] [address] [place] [address] Phone: [phone] Fax: [phone]    Patient notified that their request is being sent to the clinical staff for review and that they should receive a response within 2 business days.   Please advise at Mobile [phone] (mobile)

## 2022-11-05 MED ORDER — HYDROCODONE-ACETAMINOPHEN 10-325 MG PO TABS: 1.0000 | ORAL_TABLET | Freq: Three times a day (TID) | ORAL | 0 refills | Status: AC | PRN

## 2022-11-05 NOTE — Addendum Note (Signed)
Addended by: Wallene Huh on: 11/05/2022 04:52 PM   Modules accepted: Orders

## 2022-11-06 ENCOUNTER — Encounter: Payer: Self-pay | Admitting: Podiatry

## 2022-11-06 DIAGNOSIS — M25775 Osteophyte, left foot: Secondary | ICD-10-CM | POA: Diagnosis not present

## 2022-11-06 DIAGNOSIS — M7752 Other enthesopathy of left foot: Secondary | ICD-10-CM | POA: Diagnosis not present

## 2022-11-07 ENCOUNTER — Encounter: Payer: Self-pay | Admitting: Podiatry

## 2022-11-07 ENCOUNTER — Ambulatory Visit (INDEPENDENT_AMBULATORY_CARE_PROVIDER_SITE_OTHER): Payer: Medicare Other

## 2022-11-07 ENCOUNTER — Telehealth: Payer: Self-pay

## 2022-11-07 ENCOUNTER — Ambulatory Visit (INDEPENDENT_AMBULATORY_CARE_PROVIDER_SITE_OTHER): Payer: Medicare Other | Admitting: Podiatry

## 2022-11-07 DIAGNOSIS — Z9889 Other specified postprocedural states: Secondary | ICD-10-CM

## 2022-11-07 DIAGNOSIS — M898X7 Other specified disorders of bone, ankle and foot: Secondary | ICD-10-CM | POA: Diagnosis not present

## 2022-11-07 NOTE — Progress Notes (Signed)
Subjective:   Patient ID: [name], female   DOB: 82 y.o.   MRN: [id number]   HPI Patient presents stating I was just concerned about some sharp pain I have this morning and bleeding   ROS      Objective:  Physical Exam  Neurovascular status appears to be intact with patient's incision site not exposed today as its only been 1 day since surgery but I did take part of the dressing off there was no fresh blood I applied more compression to the area which was tolerated well and reapplied boot     Assessment:  Looks relatively normal no indications of pathology and bleeding which appears to be within normal limits for 24 hours postop     Plan:  X-ray does indicate good structure I did not see any pathology from that standpoint continue wearing sterile dressing till we see her back next week and boot

## 2022-11-08 NOTE — Telephone Encounter (Signed)
I can't tell if this is new, I did see her yesterday

## 2022-11-12 ENCOUNTER — Ambulatory Visit (INDEPENDENT_AMBULATORY_CARE_PROVIDER_SITE_OTHER): Payer: Medicare Other | Admitting: Podiatry

## 2022-11-12 ENCOUNTER — Telehealth: Payer: Self-pay | Admitting: Podiatry

## 2022-11-12 ENCOUNTER — Encounter: Payer: Self-pay | Admitting: Podiatry

## 2022-11-12 DIAGNOSIS — M898X7 Other specified disorders of bone, ankle and foot: Secondary | ICD-10-CM | POA: Diagnosis not present

## 2022-11-12 DIAGNOSIS — Z9889 Other specified postprocedural states: Secondary | ICD-10-CM

## 2022-11-12 NOTE — Telephone Encounter (Signed)
Patient called and she wanted to know when she should start back on her blood thinners. Omega 3 fish oil and baby Asprin.   She has been off of this medication since before sx.   Please advise

## 2022-11-12 NOTE — Progress Notes (Signed)
Subjective:   Patient ID: [name], female   DOB: 82 y.o.   MRN: [id number]   HPI Patient presents stating that she is very pleased with surgery she is doing much better with minimal discomfort   ROS      Objective:  Physical Exam  Neurovascular status intact negative Bevelyn Buckles' sign noted wound edges well coapted left midfoot with what appears to be reduction of the bone bulkiness plantar medial midfoot     Assessment:  Doing well post bone resection     Plan:  Discussed continued immobilization elevation compression applied Ace wrap and dispensed surgical shoe but I would like for her as best as possible to still wear the boot over the next 1 to 2 weeks.  Reappoint 2 weeks for suture removal or earlier if needed

## 2022-11-12 NOTE — Telephone Encounter (Signed)
She can start now

## 2022-11-13 ENCOUNTER — Other Ambulatory Visit: Payer: Self-pay

## 2022-11-13 DIAGNOSIS — Z79899 Other long term (current) drug therapy: Secondary | ICD-10-CM

## 2022-11-16 ENCOUNTER — Telehealth: Payer: Self-pay

## 2022-11-16 NOTE — Progress Notes (Signed)
Patient ID: [name], female   DOB: 1941/02/01, 82 y.o.   MRN: [id number] Care Management & Coordination Services Pharmacy Team  Reason for Encounter: Chart Prep for initial encounter with Theo Dills on 11/21/22 at 3 pm in office. Spoke with patient on 11/16/2022   Have you seen any other providers since your last visit? Patient reports other than Podiatry she has not.  Any changes in your medications or health? Patient reports after her foot surgery she still has no started back on her blood thinners as she had some bleeding from her incision, she Is to follow up with podiatry to speak with him about this week, she reports she is aware he stated it was ok to start via my chart.  Any side effects from any medications? Patient reports none  Do you have an symptoms or problems not managed by your medications? Patient reports no  Any concerns about your health right now? Patient reports she has normal concerns with her aging and she does not like being in a boot for her foot and it affecting her mobility at present.   Has your provider asked that you check blood pressure, blood sugar, or follow special diet at home? Patient reports she does check her blood pressures at home often, she thinks that her readings are high in the office because it is usually taken after an exerting walk to the room without rest. She will bring her cuff with her to the visit for accuracy.  Do you get any type of exercise on a regular basis? Patient reports she is using a Rolator at all times and a wheelchair her friend helps her get around with when needed.  Do you have any problems getting your medications? Patient reports she has been [facility] as it is the most cost effective for her.   Is there anything that you would like to discuss during the appointment? Patient reports none  Patient aware to bring blood pressure cuff, medications that do not need refrigeration and supplements to appointment    Chart review:  Recent office visits:  10/15/22 Eulas Post, MD - Patient presented for Unilateral headache and other concerns. No medication changes.  09/26/22 Burchette, Alinda Sierras, MD - Patient presented for Chronic bilateral low back pain without sciatica and other concerns. Stopped Doxycycline.   08/14/22 Burchette, Alinda Sierras, MD - Patient presented for Fatigue unspecified type and other concerns. Prescribed Doxycycline. Stopped Clindamycin.   08/01/22 Burchette, Alinda Sierras, MD - Patient presented for Wound of left lower extremity and other concerns. Extended Clindamycin for 3 more days.  07/20/22 Billie Ruddy, MD - Patient presented for Acute cough and other concerns. Recommended Mucinex OTC. No medication changes.  07/17/22 Lucretia Kern, DO - Patient presented via video for nasal congestion and other concerns. Prescribed Benzonatate.   06/26/22 Burchette, Alinda Sierras, MD - Patient presented for Chronic bilateral low back pain without sciatica and other concern. Prescribed Oxycodone 10 mg.   06/25/22 Criselda Peaches, LPN - Patient presented for Medicare Annual Wellness Exam. Patient expressed goal of eating more fiber. No medication changes.   05/30/22 Burchette, Alinda Sierras, MD - Patient presented for Constipation due to opoid therapy. Stopped Clobetasol.  Recent consult visits:  11/12/22 Wallene Huh, DPM (Podiatry) - Patient presented for Exostosis of left foot and other concerns. No medication changes.  11/08/22 Leigh Aurora (Rheumatology) - Patient presented for Follow up . No other visit details available.  11/07/22 Ila Mcgill  S, DPM (Podiatry) - Patient presented for Post op state. No medication changes.   10/30/22  Leigh Aurora (Rheumatology) - Patient presented for Rheumatoid arthritis and other concerns. No medication changes noted.  10/29/22 Wallene Huh, DPM  (Podiatry) - Patient presented for Capsulitis of left foot and other concerns. Prescribed Norco  10/05/22 Fredirick Maudlin, MD ([facility]) - Patient presented for wound care of midfoot. No medication changes.  09/12/22 Marcial Pacas, MD (Neurology) - Patient presented for confusion. EEG done no medication changes.   09/05/22 Ricard Dillon, MD ([place]) - Patient presented for wound care. No medication changes.   09/04/22 JAMES [facility] (Rheumatology) - Patient presented for rheumatoid arthritis with rheumatoid factor of multiple sites and other concerns.  No medication changes.   08/07/22 Marcial Pacas, MD (Neurology) - Patient presented for confusion. No medication changes.  07/23/22 HANNAH WALKER ([facility] ) - Patient presented for Wound of left lowe extremity.  No medication changes.   05/21/22 Wallene Huh, DPM (Podiatry) - Patient presented for Capsulitis of left foot and other concerns.Steroid Injection administered.    Hospital visits:  Medication Reconciliation was completed by comparing discharge summary, patient's EMR and Pharmacy list, and upon discussion with patient.  Patient presented to [facility] ED at [facility] on  08/02/22 due to Dizziness. Patient was present for 26 hours.  New?Medications Started at [facility] Discharge:?? -started  Asprin 81 mg Atorvastatin 40 mg Clopidogrel 75 mg Guaifenesin 600 mg   Medication Changes at Hospital Discharge: -Changed  none  Medications Discontinued at Hospital Discharge: -Stopped  Benzonatate 100 mg Flonase 50 mcg Oxycodone 10 mg  Medications that remain the same after Hospital Discharge:??  -All other medications will remain the same.     Medication Reconciliation was completed by comparing discharge summary, patient's EMR and Pharmacy list, and upon discussion with patient.  Patient presented to [facility] ED at [facility] on 07/30/22 due to Wound Cellulitis. Patient was present for 2 hours.  New?Medications Started at [facility] Discharge:?? -started  Clindamycin 300 mg Lactobacillus  Medication  Changes at Hospital Discharge: -Changed  none  Medications Discontinued at Hospital Discharge: -Stopped  none  Medications that remain the same after Hospital Discharge:??  -All other medications will remain the same.  Medication Reconciliation was completed by comparing discharge summary, patient's EMR and Pharmacy list, and upon discussion with patient.   Patient presented to [facility] ED at [facility] on 07/13/22 due to Hematoma Patient was present for 1 hours.  New?Medications Started at [facility] Discharge:?? -started  none  Medication Changes at Hospital Discharge: -Changed  none  Medications Discontinued at Hospital Discharge: -Stopped  none  Medications that remain the same after Hospital Discharge:??  -All other medications will remain the same.    Medication Reconciliation was completed by comparing discharge summary, patient's EMR and Pharmacy list, and upon discussion with patient.  Patient presented to [facility] ED at [facility] on 07/09/22 due to Leg hematoma and other concerns. Patient was present for 59 min.  New?Medications Started at [facility] Discharge:?? -started  none  Medication Changes at Hospital Discharge: -Changed  none  Medications Discontinued at Hospital Discharge: -Stopped  none  Medications that remain the same after Hospital Discharge:??  -All other medications will remain the same.     Fill History: AMLODIPINE BESYLATE 5 MG TAB 10/22/2022 90   ATORVASTATIN '40MG'$  TABLETS 09/26/2022 90   FUROSEMIDE 40 MG TABLET 09/14/2022 90   GABAPENTIN 300 MG CAPSULE  09/19/2022 90   HYDROCODONE/ACETAMINOPHEN 10-325 T 11/05/2022 5   OXYCONTIN ER 10 MG TABLET 11/14/2022 30   POTASSIUM CL ER 10 MEQ TABLET 09/03/2022 90    Star Rating Drugs:  Atorvastatin 40 mg- Last filled 09/26/22 90 DS at [place]: Eye Exam - Overdue Diabetic Urine - Overdue Zoster Vaccine - Overdue Foot Exam - Overdue Covid  Booster - Overdue Lab Results  Component Value Date   HGBA1C 5.0 08/03/2022        Ned Clines [place] Clinical Pharmacist Assistant [phone]

## 2022-11-20 ENCOUNTER — Telehealth: Payer: Self-pay

## 2022-11-20 NOTE — Progress Notes (Signed)
Patient ID: [name], female   DOB: [birth year], 82 y.o.   MRN: [id number]  Care Management & Coordination Services Pharmacy Team  Reason for Encounter: Appointment Reminder  Contacted patient to confirm in office appointment with [name], PharmD on 11/21/22 at 3. Spoke with patient on 11/20/2022      Star Rating Drugs:  Atorvastatin 40 mg- Last filled 09/26/22 90 DS at [place]: Eye Exam - Overdue Diabetic Urine - Overdue Zoster Vaccine - Overdue Foot Exam - Overdue Covid Booster - Overdue Recent Labs       Lab Results  Component Value Date    HGBA1C 5.0 08/03/2022          Ned Clines [place] Clinical Pharmacist Assistant [phone]

## 2022-11-20 NOTE — Progress Notes (Unsigned)
Care Management & Coordination Services Pharmacy Note  11/21/2022 Name:  LYNELL BENZIE MRN:  [id number] DOB:  [birth year]  Summary: BP at goal <140/90 Denies any falls this year LDL elevated >70 Recovering from foot sx, experiencing SOB episodes that she is unsure if anxious or breathing issue, f/u scheduled with PCP for eval and possible referral to pulm if needed  Recommendations/Changes made from today's visit: -Check BP once weekly and keep a log. Counseled on signs of hypo/hypertension and stroke. -Due for DEXA scan this year, recommend scheduling at next pharmacist appt -Continue cholesterol medication, be mindful of fried fatty foods. [facility] -Discussed strategies for coping with increased stress/anxiety post-foot surgery and reviewed signs of worsening lung issues. Scheduled f/u with PCP  Follow up plan: Scheduled f/u with PCP on 3/8 per patient request BP call in 3-4 weeks Pharmacist visit in May   Subjective: AZAYLEA DEMBY is an 82 y.o. year old female who is a primary patient of Burchette, Alinda Sierras, MD.  The care coordination team was consulted for assistance with disease management and care coordination needs.    Engaged with patient face to face for initial visit. Her friend Wannetta Sender is present for visit, as pt is still recovering from foot surgery, has a boot on and has not been cleared to drive. Patient brings meds to appt, is very organized, and knows what her medications are for. Takes extensive notes during the appt and understands all info discussed.  Primary complaint today is she has begun to experience periods of shortness of breath that she cannot recall when it started, is not sure if it is stress/anxiety related or if her pulmonary fibrosis is worsening. Sch appt with PCP for 3/8 for eval, discussed coping mechanisms for stress/anxiety in the meantime.  Recent office visits: 10/15/22 Eulas Post, MD -  Patient presented for Unilateral headache and other concerns. No medication changes.   09/26/22 Burchette, Alinda Sierras, MD - Patient presented for Chronic bilateral low back pain without sciatica and other concerns. Stopped Doxycycline.    08/14/22 Burchette, Alinda Sierras, MD - Patient presented for Fatigue unspecified type and other concerns. Prescribed Doxycycline. Stopped Clindamycin.    08/01/22 Burchette, Alinda Sierras, MD - Patient presented for Wound of left lower extremity and other concerns. Extended Clindamycin for 3 more days.   07/20/22 Billie Ruddy, MD - Patient presented for Acute cough and other concerns. Recommended Mucinex OTC. No medication changes.   07/17/22 Lucretia Kern, DO - Patient presented via video for nasal congestion and other concerns. Prescribed Benzonatate.    06/26/22 Burchette, Alinda Sierras, MD - Patient presented for Chronic bilateral low back pain without sciatica and other concern. Prescribed Oxycodone 10 mg.    06/25/22 Criselda Peaches, LPN - Patient presented for Medicare Annual Wellness Exam. Patient expressed goal of eating more fiber. No medication changes.    05/30/22 Burchette, Alinda Sierras, MD - Patient presented for Constipation due to opoid therapy. Stopped Clobetasol.  Recent consult visits: 11/12/22 Wallene Huh, DPM (Podiatry) - Patient presented for Exostosis of left foot and other concerns. No medication changes.   11/08/22 Leigh Aurora (Rheumatology) - Patient presented for Follow up . No other visit details available.   11/07/22 Wallene Huh, DPM (Podiatry) - Patient presented for Post op state. No medication changes.    10/30/22  Leigh Aurora (Rheumatology) - Patient presented for Rheumatoid arthritis and other concerns. No medication changes noted.  10/29/22 Wallene Huh, DPM  (Podiatry) - Patient presented for Capsulitis of left foot and other concerns. Prescribed Norco   10/05/22 Fredirick Maudlin, MD ([facility]) - Patient presented for wound care of  midfoot. No medication changes.   09/12/22 Marcial Pacas, MD (Neurology) - Patient presented for confusion. EEG done no medication changes.    09/05/22 Ricard Dillon, MD ([facility]) - Patient presented for wound care. No medication changes.    09/04/22 JAMES [facility] (Rheumatology) - Patient presented for rheumatoid arthritis with rheumatoid factor of multiple sites and other concerns.  No medication changes.    08/07/22 Marcial Pacas, MD (Neurology) - Patient presented for confusion. No medication changes.   07/23/22 HANNAH WALKER ([facility] ) - Patient presented for Wound of left lowe extremity.  No medication changes.    05/21/22 Wallene Huh, DPM (Podiatry) - Patient presented for Capsulitis of left foot and other concerns.Steroid Injection administered.   Hospital visits: 08/02/22 due to Dizziness. Patient was present for 26 hours at [facility]. STARTED Asprin 81 mg, Atorvastatin 40 mg, Clopidogrel 75 mg, Guaifenesin 600 mg. Stopped Benzonatate 100 mg, Flonase 50 mcg, Oxycodone 10 mg   07/30/22 [place] [facility] due to Wound Cellulitis. Patient was present for 2 hours. START  Clindamycin 300 mg, Lactobacillus   07/13/22 due to Hematoma Patient was present for 1 hour. No med changes   Objective:  Lab Results  Component Value Date   CREATININE 1.29 (H) 08/02/2022   BUN 15 08/02/2022   GFR 42.93 (L) 09/04/2021   EGFR 46 05/16/2022   GFRNONAA 41 (L) 08/02/2022   GFRAA 40 (L) 09/04/2013   NA 141 08/02/2022   K 3.7 08/02/2022   CALCIUM 8.1 (L) 08/02/2022   CO2 26 08/02/2022   GLUCOSE 103 (H) 08/02/2022    Lab Results  Component Value Date/Time   HGBA1C 5.0 08/03/2022 02:15 AM   HGBA1C 5.3 10/30/2017 11:08 AM   HGBA1C 5.6 06/13/2015 03:09 PM   GFR 42.93 (L) 09/04/2021 11:39 AM   GFR 37.61 (L) 07/14/2021 09:52 AM    Last diabetic Eye exam: No results found for: "HMDIABEYEEXA"  Last diabetic Foot exam: No results found for: "HMDIABFOOTEX"   Lab Results  Component Value  Date   CHOL 143 08/03/2022   HDL 47 08/03/2022   LDLCALC 85 08/03/2022   TRIG 56 08/03/2022   CHOLHDL 3.0 08/03/2022       Latest Ref Rng & Units 08/02/2022    2:27 PM 07/30/2022    4:05 PM 05/16/2022   12:00 AM  Hepatic Function  Total Protein 6.5 - 8.1 g/dL 7.0  7.6    Albumin 3.5 - 5.0 g/dL 3.4  4.2  4.2      AST 15 - 41 U/L 21  22    ALT 0 - 44 U/L 14  12    Alk Phosphatase 38 - 126 U/L 66  72    Total Bilirubin 0.3 - 1.2 mg/dL 0.9  0.7       This result is from an external source.    Lab Results  Component Value Date/Time   TSH 1.512 08/02/2022 09:29 PM   TSH 3.17 07/14/2021 09:52 AM   TSH 2.13 01/08/2020 12:04 PM       Latest Ref Rng & Units 08/02/2022    2:27 PM 07/30/2022    4:05 PM 05/16/2022   12:00 AM  CBC  WBC 4.0 - 10.5 K/uL 8.3  8.6  8.3  Hemoglobin 12.0 - 15.0 g/dL 12.9  13.5  13.8      Hematocrit 36.0 - 46.0 % 35.6  41.1  41      Platelets 150 - 400 K/uL 258  259  217         This result is from an external source.    Lab Results  Component Value Date/Time   VITAMINB12 351 07/14/2021 09:52 AM   [id number] 03/16/2020 10:27 AM    Clinical ASCVD: Yes  The ASCVD Risk score (Arnett DK, et al., 2019) failed to calculate for the following reasons:   The 2019 ASCVD risk score is only valid for ages 81 to 1        09/25/2022    1:50 PM 06/25/2022   11:03 AM 01/15/2022   11:17 AM  Depression screen PHQ 2/9  Decreased Interest 0 0 0  Down, Depressed, Hopeless 0 0 0  PHQ - 2 Score 0 0 0     Social History   Tobacco Use  Smoking Status Never  Smokeless Tobacco Never  Tobacco Comments   Father    BP Readings from Last 3 Encounters:  11/21/22 136/85  10/15/22 (!) 150/78  09/26/22 (!) 140/72   Pulse Readings from Last 3 Encounters:  11/21/22 73  10/15/22 82  09/26/22 82   Wt Readings from Last 3 Encounters:  10/15/22 130 lb 12.8 oz (59.3 kg)  09/26/22 132 lb 9.6 oz (60.1 kg)  08/14/22 132 lb 3.2 oz (60 kg)   BMI Readings from  Last 3 Encounters:  10/15/22 24.71 kg/m  09/26/22 25.05 kg/m  08/14/22 24.98 kg/m    Allergies  Allergen Reactions   Hydromorphone Other (See Comments)    Cognitive changes  "made me unconscious" Dilaudid    Medications Reviewed Today     Reviewed by Maren Reamer, RPH (Pharmacist) on 11/21/22 at 40  Med List Status: <None>   Medication Order Taking? Sig Documenting Provider Last Dose Status Informant  amLODipine (NORVASC) 5 MG tablet [id number]  TAKE 1 TABLET DAILY Burchette, Alinda Sierras, MD  Active   aspirin EC 81 MG tablet [id number]  Take 1 tablet (81 mg total) by mouth daily. Swallow whole. Bonnielee Haff, MD  Active   atorvastatin (LIPITOR) 40 MG tablet [id number]  Take 1 tablet (40 mg total) by mouth daily. Eulas Post, MD  Active   Calcium Carb-Cholecalciferol (CALCIUM CARBONATE-VITAMIN D3 PO) [id number]  Take 1 tablet by mouth daily [provider]  Active Self, Pharmacy Records  cholecalciferol (VITAMIN D) 1000 UNITS tablet [id number]  Take 1,000 Units by mouth daily. [provider]  Active Self, Pharmacy Records  denosumab (PROLIA) 60 MG/ML SOLN injection [id number]  Inject 60 mg into the skin every 6 (six) months. [provider]  Active Self, Pharmacy Records           Med Note (COX, HEATHER C   Fri Jun 14, 2017 11:28 AM)    docusate sodium (COLACE) 100 MG capsule [id number]  Take 100 mg by mouth daily. [provider]  Active Self, Pharmacy Records  furosemide (LASIX) 40 MG tablet [id number]  TAKE 1 TABLET DAILY Burchette, Alinda Sierras, MD  Active   gabapentin (NEURONTIN) 300 MG capsule [id number]  Take 1 capsule (300 mg total) by mouth 2 (two) times daily. Eulas Post, MD  Active Self, Pharmacy Records  Golimumab [facility] ARIA IV) [id number]  Inject into the vein. Takes infusion every 8 wks. [provider]  Active Self, Pharmacy  Records  Multiple Vitamins-Minerals (MULTIVITAMIN,TX-MINERALS) tablet [id number]  Take 1 tablet  by mouth daily. [provider]  Active Self, Pharmacy Records  Omega-3 Fatty Acids (FISH OIL OMEGA-3 PO) [id number]  Take 1 capsule by mouth daily. [provider]  Active Self, Pharmacy Records  oxyCODONE (OXYCONTIN) 10 mg 12 hr tablet [id number]  Take 1 tablet (10 mg total) by mouth every 12 (twelve) hours. Eulas Post, MD  Consider Medication Status and Discontinue (Completed Course)   oxyCODONE (OXYCONTIN) 10 mg 12 hr tablet [id number]  Take 1 tablet (10 mg total) by mouth every 12 (twelve) hours. Eulas Post, MD  Consider Medication Status and Discontinue (Completed Course)   oxyCODONE (OXYCONTIN) 10 mg 12 hr tablet [id number]  Take 1 tablet (10 mg total) by mouth every 12 (twelve) hours. May refill 29 days after prior prescription Eulas Post, MD  Active   potassium chloride (KLOR-CON M) 10 MEQ tablet [id number]  TAKE 1 TABLET TWICE A DAY Burchette, Alinda Sierras, MD  Active   predniSONE (DELTASONE) 1 MG tablet [id number]  TAKE 4 TABLETS DAILY WITH BREAKFAST  Patient taking differently: Take 4 mg by mouth daily with breakfast.   Suzzanne Cloud, NP  Active Self, Pharmacy Records  Probiotic, Lactobacillus, CAPS [id number] No Take 1 each by mouth daily.  Patient not taking: Reported on 11/21/2022   Tretha Sciara, MD Not Taking Consider Medication Status and Discontinue (Patient Preference) Self, Pharmacy Records  vitamin B-12 (CYANOCOBALAMIN) 100 MCG tablet [id number]  Take 100 mcg by mouth daily. [provider]  Active Self, Pharmacy Records            SDOH:  (Social Determinants of Health) assessments and interventions performed: Yes SDOH Interventions    Flowsheet Row Care Coordination from 11/21/2022 in [place] Telephone from 09/25/2022 in [place] Telephone from 07/17/2022 in [place] Coordination Clinical Support from 06/25/2022 in [place] [facility] [place] from 06/22/2021 in [place] [facility] [place] from 06/13/2020 in [place] [facility] [place] Interventions Intervention Not Indicated Intervention Not Indicated -- Intervention Not Indicated Intervention Not Indicated --  Housing Interventions Intervention Not Indicated Intervention Not Indicated Intervention Not Indicated Intervention Not Indicated Intervention Not Indicated --  Transportation Interventions -- Intervention Not Indicated Intervention Not Indicated -- Intervention Not Indicated --  Utilities Interventions Intervention Not Indicated Intervention Not Indicated -- Intervention Not Indicated -- --  Alcohol Usage Interventions -- -- -- Intervention Not Indicated (Score <7) -- --  Depression Interventions/Treatment  -- -- -- -- -- PHQ2-9 Score <4 Follow-up Not Indicated  Financial Strain Interventions Intervention Not Indicated -- -- Intervention Not Indicated Intervention Not Indicated --  Physical Activity Interventions -- -- -- Intervention Not Indicated Intervention Not Indicated --  Stress Interventions -- -- -- Intervention Not Indicated Intervention Not Indicated --  Social Connections Interventions -- -- -- Intervention Not Indicated Intervention Not Indicated Intervention Not Indicated       Medication Assistance: None required.  Patient affirms current coverage meets needs.  Medication Access: Within the past 30 days, how often has patient missed a dose of medication? None Is a pillbox or other method used to improve adherence? Yes  Factors that may affect medication adherence? no barriers identified Are meds synced by current pharmacy? No  Are meds delivered by current pharmacy? Yes  Does  patient experience delays in picking up medications due to transportation concerns? No   Upstream Services Reviewed: Is patient disadvantaged to use  [facility] Pharmacy?: Yes  Current Rx insurance plan: T[employer] Name and location of Current pharmacy:  [facility] [place], [address] AT [address] [place] [address] Phone: [phone] Fax: [phone]  [facility] [place], [place] [address] [place] [address] Phone: [phone] Fax: [phone]  UpStream Pharmacy services reviewed with patient today?: No  Patient requests to transfer care to Upstream Pharmacy?: No  Reason patient declined to change pharmacies: Disadvantaged due to insurance/mail order  Compliance/Adherence/Medication fill history: Care Gaps: Eye Exam - Overdue -  not overdue, gets checked twice yearly Diabetic Urine - Overdue - no active diabetes on problem list, will discuss with PCP about potentially adding? Zoster Vaccine - Overdue Foot Exam - Overdue-see above for diabetes Covid Booster - Overdue - did not discuss vaccinations today, will address at next f/u appt in May  Star-Rating Drugs: Atorvastatin '40mg'$  PDC 100%   Assessment/Plan   Hypertension (BP goal <140/90) -Controlled -Current treatment: Amlodipine '5mg'$  1 qd Appropriate, Effective, Safe, Accessible Lasix '40mg'$  1 qd Appropriate, Effective, Safe, Accessible Klor-Con 56mq 1 BID Appropriate, Effective, Safe, Accessible -Medications previously tried: Losartan  -Current home readings: not checking daily ,checked in office today and controlled at 136/85 with digital monitor on arm -Current dietary habits: mindful of salt -Current exercise habits: limited due to foot surgery at this time, walking with a walker and boot on left foot -Denies hypotensive/hypertensive symptoms -Educated on BP goals and benefits of medications for prevention of heart attack, stroke and kidney damage; Daily salt intake goal < 2300 mg; Exercise goal of 150 minutes per week; Importance of  home blood pressure monitoring; Proper BP monitoring technique; Symptoms of hypotension and importance of maintaining adequate hydration; -Counseled to monitor BP at home once weekly or if experiencing sx of hypo/hypertension, document, and provide log at future appointments -Counseled on diet and exercise extensively Recommended to continue current medication -[facility]  Hyperlipidemia: (LDL goal < 70) -Not ideally controlled -Current treatment: Atorvastatin '40mg'$  1 qd Appropriate, Query Effective Fish Oil '1000mg'$  qam Appropriate, Effective, Safe, Accessible -Medications previously tried: Simvastatin  -Current dietary patterns: not discussed -Current exercise habits: see above -Educated on Cholesterol goals;  Importance of limiting foods high in cholesterol; -Recommended to continue current medication -Future consideration: increase statin to '80mg'$  or addition of zetia if ldl remains above 70 due to hx of TIA  Osteoporosis / Osteopenia (Goal: Prevent bone fracture) -Controlled -Last DEXA Scan: 07/22/19   T-Score femoral neck: -2.0 -Current treatment  Prolia every 6 months Appropriate, Effective, Safe, Accessible Calcium + Vit D Appropriate, Effective, Safe, Accessible -Medications previously tried: None  -Recommend [phone] units of vitamin D daily. Recommend 1200 mg of calcium daily from dietary and supplemental sources. Recommend weight-bearing and muscle strengthening exercises for building and maintaining bone density. Counseled on limiting fall risks -Recommended to continue current medication Recommended updated DEXA scan this year, will schedule at next f/u appt  Query Diabetes (A1c goal <7%) -Not assessed today -Current medications: None -Not currently listed on patient's problem list but mentioned in [place] will discuss with PCP about need to add on? Would change how BP is ideally managed for patient and care  gaps -Counseled to check  feet daily and get yearly eye exams  CVD (Goal: Prevention of stroke or bleed) -Controlled -Current treatment  Aspirin '81mg'$  once daily Appropriate, Effective, Safe, Accessible -Medications previously tried: None  -Recommended to continue current medication. Was held for foot sx but patient had not resumed after. Recommended patient resume and watch for signs of bleed.  Pain Mgmt/RA (Goal: Pain control that allows for ADL) -Not assessed today -Current treatment  Oxycontin '10mg'$  q 12 hours Appropriate, Effective, Safe, Accessible Prednisone '4mg'$  qam Appropriate, Effective, Safe, Accessible Simponi IV q 2 months Appropriate, Effective, Safe, Accessible Docusate '100mg'$  BID Appropriate, Effective, Safe, Accessible  OTC Medications (Goal: Minimize drug intxn and medication overuse) -Current treatment  Biotin 1 tab daily Appropriate, Effective, Safe, Accessible Vitamin B 12 1028mg 1 tab daily Appropriate, Effective, Safe, Accessible     AMaren ReamerClinical Pharmacist [phone]

## 2022-11-21 ENCOUNTER — Ambulatory Visit: Payer: Medicare Other

## 2022-11-26 ENCOUNTER — Ambulatory Visit (INDEPENDENT_AMBULATORY_CARE_PROVIDER_SITE_OTHER): Payer: Medicare Other | Admitting: *Deleted

## 2022-11-26 ENCOUNTER — Ambulatory Visit (INDEPENDENT_AMBULATORY_CARE_PROVIDER_SITE_OTHER): Payer: Medicare Other

## 2022-11-26 ENCOUNTER — Other Ambulatory Visit: Payer: Self-pay | Admitting: Podiatry

## 2022-11-26 DIAGNOSIS — M898X7 Other specified disorders of bone, ankle and foot: Secondary | ICD-10-CM

## 2022-11-26 DIAGNOSIS — Z9889 Other specified postprocedural states: Secondary | ICD-10-CM

## 2022-11-26 NOTE — Progress Notes (Signed)
Patient presents today for post op visit # 3, patient of Dr. Paulla Dolly.   POV #3 DOS 11/06/22 --- REMOVAL BONE SPUR FROM LEFT MIDFOOT   Sutures removed today.  Incisions look good and no signs of infections. Patient states she hasn't had a whole lot of pain. She is asking if she still needs to keep something covering the wound she had there initially.   Reviewed icing and elevation. Patient will follow up with Dr. Paulla Dolly for POV# 4 in 4 weeks for another xrays.

## 2022-11-30 ENCOUNTER — Encounter: Payer: Self-pay | Admitting: Family Medicine

## 2022-11-30 ENCOUNTER — Ambulatory Visit (INDEPENDENT_AMBULATORY_CARE_PROVIDER_SITE_OTHER): Payer: Medicare Other | Admitting: Family Medicine

## 2022-11-30 VITALS — BP 136/60 | HR 82 | Temp 98.1°F | Ht 61.0 in | Wt 135.1 lb

## 2022-11-30 DIAGNOSIS — M545 Low back pain, unspecified: Secondary | ICD-10-CM

## 2022-11-30 DIAGNOSIS — G8929 Other chronic pain: Secondary | ICD-10-CM

## 2022-11-30 DIAGNOSIS — R0989 Other specified symptoms and signs involving the circulatory and respiratory systems: Secondary | ICD-10-CM | POA: Diagnosis not present

## 2022-11-30 DIAGNOSIS — H1132 Conjunctival hemorrhage, left eye: Secondary | ICD-10-CM | POA: Diagnosis not present

## 2022-11-30 NOTE — Progress Notes (Signed)
Established Patient Office Visit  Subjective   Patient ID: [name], female    DOB: 1941/07/08  Age: 82 y.o. MRN: [id number]  Chief Complaint  Patient presents with   Eye Injury    Patient complains of eye trauma in left eye, Patient reported she woke up today with eye redness   Anxiety    Patient complains of anxiety     HPI   [name] is seen today for the following items  Left eye redness.  She woke up this morning with some bleeding subconjunctival area.  Denies any eye trauma.  No contact use.  Does not recall rubbing her eyes.  She does have frequent watering in her eyes at night.  She thinks she may have inadvertently rubbed her eye.  No eye pain.  No itching.  No blurred vision.  She takes baby aspirin daily  Other issue is dyspnea.  She states her symptoms actually occur at rest and not with walking or activity.  She has more of a sensation of difficulty getting a deep breath when she feels anxious.  Denies any recent cough.  No orthopnea.  Some chronic bilateral lower extremity edema unchanged.  Never smoked.  No chest pain.  No pleuritic pain.  When she is distracted symptoms generally improve  She has chronic back pain with history of multiple prior surgeries.  She has been on pain management with OxyContin 10 mg twice daily for many years.  Controls pain fairly well.  She initially was requesting refill but in looking back she had refills February night for 3 months so should not be due until May 9th  Past Medical History:  Diagnosis Date   Allergic rhinitis    Anemia    Anxiety    Bronchiectasis    Carpal tunnel syndrome 07/13/2015   Bilateral   CVA (cerebral infarction) 10/2007   Right thalamic    Diverticulosis of colon    Gait disorder    GERD (gastroesophageal reflux disease)    History of breast cancer    HTN (hypertension)    Hyperlipidemia    Idiopathic pulmonary fibrosis    Left knee DJD    Migraine    Osteoporosis    Peripheral neuropathy     PMR (polymyalgia rheumatica) (HCC)    Polyneuropathy in other diseases classified elsewhere ([place]) 02/17/2013   Previous back surgery 10/09/12   Renal insufficiency    Rheumatoid arthritis (HCC)    Seizure disorder (HCC)    Spondylosis, cervical, with myelopathy 08/31/2015   C3-4 myelopathy   Temporal arteritis (HCC)    Right eye blind, on steroids per [facility]/ Dr [name]   Type II or unspecified type diabetes mellitus without mention of complication, not stated as uncontrolled    2nd to steriods   Vitamin D deficiency    Past Surgical History:  Procedure Laterality Date   APPENDECTOMY  01/2021   CARPAL TUNNEL RELEASE Right    CATARACT EXTRACTION     OS - Summer 2010   CERVICAL FUSION  09/24/2008   4 rods and pins in place   CHOLECYSTECTOMY     COLONOSCOPY  12/15/2011   Procedure: COLONOSCOPY;  Surgeon: [name], MD;  Location: [facility];  Service: Endoscopy;  Laterality: N/A;   LUMBAR FUSION  04/24/2010   W/Mechanical fixation - [facility]   LUMBAR FUSION  09/25/2011   rods in hips (to stabilize).   MASTECTOMY     NECK SURGERY  rheumatoid nodule removal     SPINAL FUSION  07/25/2010   T10-L2 interbody fusion / [place]     Left    reports that she has never smoked. She has never used smokeless tobacco. She reports that she does not currently use alcohol. She reports that she does not use drugs. family history includes Arthritis in her father and mother; Brain cancer in her mother; Breast cancer in her sister; Dementia in her father; Hypertension in her father and mother; Lung cancer in her sister. Allergies  Allergen Reactions   Hydromorphone Other (See Comments)    Cognitive changes  "made me unconscious" Dilaudid    Review of Systems  Constitutional:  Negative for chills and fever.  Eyes:  Positive for redness. Negative for blurred vision, double vision, photophobia, pain and discharge.   Respiratory:  Negative for cough, hemoptysis and wheezing.   Cardiovascular:  Negative for chest pain and palpitations.  Musculoskeletal:  Positive for back pain.      Objective:     BP 136/60 (BP Location: Left Arm, Patient Position: Sitting, Cuff Size: Normal)   Pulse 82   Temp 98.1 F (36.7 C) (Oral)   Ht '5\' 1"'$  (1.549 m)   Wt 135 lb 1.6 oz (61.3 kg)   SpO2 98%   BMI 25.53 kg/m  BP Readings from Last 3 Encounters:  11/30/22 136/60  11/21/22 136/85  10/15/22 (!) 150/78   Wt Readings from Last 3 Encounters:  11/30/22 135 lb 1.6 oz (61.3 kg)  10/15/22 130 lb 12.8 oz (59.3 kg)  09/26/22 132 lb 9.6 oz (60.1 kg)      Physical Exam Vitals reviewed.  Constitutional:      General: She is not in acute distress. Eyes:     Comments: She has left subconjunctival hemorrhage.  This is along the inferior lateral portion.  Remainder of eye exam is normal.  Pupils equal round reactive to light.  Cornea appears normal.  Right eye exam is normal.  Cardiovascular:     Rate and Rhythm: Normal rate and regular rhythm.  Pulmonary:     Effort: Pulmonary effort is normal.     Breath sounds: Normal breath sounds.  Neurological:     Mental Status: She is alert.      No results found for any visits on 11/30/22.    The ASCVD Risk score (Arnett DK, et al., 2019) failed to calculate for the following reasons:   The 2019 ASCVD risk score is only valid for ages 76 to 29    Assessment & Plan:   #1 air hunger dyspnea.  Reassuring is that her symptoms are not noted whatsoever with activity but only when she is feeling anxious.  She improves when distracted.  No acute lung findings.  O2 sats 98% room air.  Reassurance.  We talked about anxiety management with behavioral strategies.  Also gave her contacts for our psychology division if she wished to have further counseling  #2 benign left subconjunctival hemorrhage.  Reassurance given.  We explained these usually resolve in 2 to 3 weeks.   Follow-up for any blurred vision, eye pain, or other new findings  #3 chronic back pain stable on OxyContin 10 mg twice daily.  She will be due for refills early May.   Carolann Littler, MD

## 2022-12-03 ENCOUNTER — Other Ambulatory Visit: Payer: Self-pay | Admitting: Family Medicine

## 2022-12-05 ENCOUNTER — Other Ambulatory Visit: Payer: Self-pay | Admitting: Family Medicine

## 2022-12-17 ENCOUNTER — Telehealth: Payer: Self-pay | Admitting: Family Medicine

## 2022-12-17 MED ORDER — ATORVASTATIN CALCIUM 40 MG PO TABS: 40.0000 mg | ORAL_TABLET | Freq: Every day | ORAL | 3 refills | Status: DC

## 2022-12-17 NOTE — Telephone Encounter (Signed)
Pt no longer want med to go to local pharm please send atorvastatin (LIPITOR) 40 MG tablet #90 with refills  [place], [place] Phone: [phone]  Fax: [phone]

## 2022-12-17 NOTE — Telephone Encounter (Signed)
Pt is calling and said [facility] would like our office to call them to let them know its ok to ship oxyCODONE (OXYCONTIN) 10 mg 12 hr tablet

## 2022-12-17 NOTE — Telephone Encounter (Signed)
Rx done. 

## 2022-12-19 DIAGNOSIS — J8410 Pulmonary fibrosis, unspecified: Secondary | ICD-10-CM | POA: Diagnosis not present

## 2022-12-19 DIAGNOSIS — D631 Anemia in chronic kidney disease: Secondary | ICD-10-CM | POA: Diagnosis not present

## 2022-12-19 DIAGNOSIS — M4802 Spinal stenosis, cervical region: Secondary | ICD-10-CM | POA: Diagnosis not present

## 2022-12-19 DIAGNOSIS — I129 Hypertensive chronic kidney disease with stage 1 through stage 4 chronic kidney disease, or unspecified chronic kidney disease: Secondary | ICD-10-CM | POA: Diagnosis not present

## 2022-12-19 DIAGNOSIS — R32 Unspecified urinary incontinence: Secondary | ICD-10-CM | POA: Diagnosis not present

## 2022-12-19 DIAGNOSIS — N189 Chronic kidney disease, unspecified: Secondary | ICD-10-CM | POA: Diagnosis not present

## 2022-12-19 DIAGNOSIS — M069 Rheumatoid arthritis, unspecified: Secondary | ICD-10-CM | POA: Diagnosis not present

## 2022-12-19 DIAGNOSIS — N1832 Chronic kidney disease, stage 3b: Secondary | ICD-10-CM | POA: Diagnosis not present

## 2022-12-19 NOTE — Telephone Encounter (Signed)
Noted [name] informed of permission to ship per [facility]

## 2022-12-19 NOTE — Telephone Encounter (Signed)
[name] with [facility] is calling to let [name] know she will put a note ok to shipped medication per [facility] request

## 2022-12-20 ENCOUNTER — Encounter: Payer: Self-pay | Admitting: Neurology

## 2022-12-20 ENCOUNTER — Ambulatory Visit (INDEPENDENT_AMBULATORY_CARE_PROVIDER_SITE_OTHER): Payer: Medicare Other | Admitting: Neurology

## 2022-12-20 VITALS — BP 134/85 | HR 73 | Ht 61.0 in | Wt 133.0 lb

## 2022-12-20 DIAGNOSIS — M316 Other giant cell arteritis: Secondary | ICD-10-CM

## 2022-12-20 DIAGNOSIS — G63 Polyneuropathy in diseases classified elsewhere: Secondary | ICD-10-CM

## 2022-12-20 LAB — LAB REPORT - SCANNED
Creatinine, Random Urine: 68.9
Protein/Creatinine Ratio: 102
Total Protein, Urine: 7

## 2022-12-20 MED ORDER — PREDNISONE 1 MG PO TABS: ORAL_TABLET | ORAL | 3 refills | Status: DC

## 2022-12-20 NOTE — Patient Instructions (Signed)
You can try to decrease dose of prednisone alternating 3 and 4 mg monitor for any worsening   See you back in 6 months

## 2022-12-20 NOTE — Progress Notes (Signed)
Chief Complaint  Patient presents with   Follow-up    Patient alone. Rm 13. States she has no energy, nervous a lot and has some questions about medication.    ASSESSMENT AND PLAN  [name] is a 82 y.o. female    1.  Right temporal arteritis 2.  Gait disturbance 3.  Peripheral neuropathy 4.  Mild cognitive impairment 5.  Transient loss of consciousness August 02, 2022  -No further syncopal spells, EEG was normal, likely spell was due to infection, decreased oral intake, dehydration.  Of note many years ago reported history of seizure-like spell -Will try to reduce prednisone alternating every other day with 3 mg versus 4 mg, if needed will stay at 4 mg daily -MoCA 26/30, within the next few years planning to relocate to [place], for now continues to live alone, drive -Follow-up in 6 months or sooner if needed  DIAGNOSTIC DATA (LABS, IMAGING, TESTING) - I reviewed patient records, labs, notes, testing and imaging myself where available. CXR on Aug 02 2022 1. No active cardiopulmonary disease. 2. Interstitial prominence predominantly at the lung bases, compatible with known interstitial lung disease. 3. Aortic atherosclerosis.  Lab in Nov 2023: CMP, creat 1.51, CBC, Hg 13.5, TSH, CPK, Troponin, A1C 5.0,     MEDICAL HISTORY:  [name] is a 82 year old female, accompanied by her l longtime friend [name] to follow-up on her recent hospital admission for passing out episode,  I reviewed and summarized the referring note. PMHX. 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 [name] in the past, temporal arteritis, with polymyalgia rheumatica, with sudden onset of right visual loss, she has been treated with prednisone for a long time, gradually tapering dose, currently taking 4 mg daily, reported relapse of body achy pain if the dosage tapering down to  below 3 mg daily  She also has rheumatoid arthritis, gait abnormality, documented using walker as far back in 2014, bilateral lower extremity neuropathic pain, required variable dose of neuropathic pain medications  At baseline, she lives independently alone at her house, drive, ambulate with walker, her only son is a physician at [employer], Per [name], is in the process of moving patient to a facility to be closer to him  Hospital admission from November 9 to 10, 2023, around 10 AM August 02, 2022, she woke up, went to the bathroom to urinate, was able to wash her hands afterwards, while washing her hands, she felt lightheaded, dizzy," her brain feels so big", she took a quick steps to her chair, slumped into the chair, apparently lost consciousness for about 2 hours, she came around calling her friend [name], was noted to have slurred speech, not herself, [name] urged her to call ambulance, was taken to the hospital  Prior to the incident, she has suffered left lower extremity bleeding, infection due to recent fall accident, she was taking antibiotics, did not have good appetite, was not able to keep up her fluid intake, was seen by wound care, pain medication OxyContin 10 mg twice a day  At the hospital, ultrasound of carotid artery showed no significant stenosis  Echocardiogram showed normal ejection fraction  LDL 85 A1c 5.0, TSH within normal limit, UA showed no evidence of infection, EKG showed no significant abnormality  Personally reviewed MRI of the brain no acute intracranial abnormality, moderate small vessel disease, moderate atrophy  MRA of the brain  showed no large vessel disease,  Worried about the TIA, she was given the prescription of Lipitor combination of aspirin and Plavix upon discharge, she has not taking Lipitor and Plavix,  Also reported a history of seizure, few years ago, was found by family on the floor, no antiepileptic medications  Update December 20, 2022 SS: EEG in December 2023 was normal. Remains on 4 mg prednisone, with 3 mg dose felt bad. No further spells of passing out. Has started drinking boost protein, food with medications. Living at home. Has walker. Getting around okay, 3 falls since November. On aspirin 81 mg daily. Planning to move to retirement homes in [facility], where her son lives, within the next 2 years. Has noted some changes to her short term memory. MOCA 26/30. Looks very well today.   PHYSICAL EXAM:   Vitals:   12/20/22 1221  BP: 134/85  Pulse: 73  Weight: 133 lb (60.3 kg)  Height: 5\' 1"  (1.549 m)    Not recorded     Body mass index is 25.13 kg/m.    12/20/2022   12:22 PM  Montreal Cognitive Assessment   Visuospatial/ Executive (0/5) 4  Naming (0/3) 3  Attention: Read list of digits (0/2) 2  Attention: Read list of letters (0/1) 1  Attention: Serial 7 subtraction starting at 100 (0/3) 2  Language: Repeat phrase (0/2) 1  Language : Fluency (0/1) 1  Abstraction (0/2) 2  Delayed Recall (0/5) 4  Orientation (0/6) 6  Total 26  Adjusted Score (based on education) 26   Physical Exam  General: The patient is alert and cooperative at the time of the examination.  Skin: No significant peripheral edema is noted.  Neurologic Exam  Mental status: The patient is alert and oriented x 3 at the time of the examination. The patient has apparent normal recent and remote memory, with an apparently normal attention span and concentration ability.  Cranial nerves: Facial symmetry is present. Speech is normal, no aphasia or dysarthria is noted. Extraocular movements are full. Visual fields are full.  Motor: Overall good strength, 4/5 bilateral hip flexion.  Significant deformity of bilateral hand joints  Sensory examination: Soft touch sensation is symmetric on the face, arms, and legs.  Coordination: The patient has good finger-nose-finger and heel-to-shin bilaterally.  Gait and station: Has to push off from seated  position to stand, gait is wide-based, unsteady, uses rolling walker in the hallway  Reflexes: Deep tendon reflexes are symmetric.  REVIEW OF SYSTEMS:  Full 14 system review of systems performed and notable only for as above All other review of systems were negative.   ALLERGIES: Allergies  Allergen Reactions   Hydromorphone Other (See Comments)    Cognitive changes  "made me unconscious" Dilaudid    HOME MEDICATIONS: Current Outpatient Medications  Medication Sig Dispense Refill   amLODipine (NORVASC) 5 MG tablet TAKE 1 TABLET DAILY 90 tablet 3   aspirin EC 81 MG tablet Take 1 tablet (81 mg total) by mouth daily. Swallow whole. 30 tablet 11   atorvastatin (LIPITOR) 40 MG tablet Take 1 tablet (40 mg total) by mouth daily. 90 tablet 3   Calcium Carb-Cholecalciferol (CALCIUM CARBONATE-VITAMIN D3 PO) Take 1 tablet by mouth daily     cholecalciferol (VITAMIN D) 1000 UNITS tablet Take 1,000 Units by mouth daily.     denosumab (PROLIA) 60 MG/ML SOLN injection Inject 60 mg into the skin every 6 (six) months.     docusate sodium (COLACE) 100 MG capsule Take  100 mg by mouth daily.     furosemide (LASIX) 40 MG tablet TAKE 1 TABLET DAILY 90 tablet 3   gabapentin (NEURONTIN) 300 MG capsule TAKE 1 CAPSULE TWICE A DAY 360 capsule 3   Golimumab (SIMPONI ARIA IV) Inject into the vein. Takes infusion every 8 wks.     Multiple Vitamins-Minerals (MULTIVITAMIN,TX-MINERALS) tablet Take 1 tablet by mouth daily.     Omega-3 Fatty Acids (FISH OIL OMEGA-3 PO) Take 1 capsule by mouth daily.     oxyCODONE (OXYCONTIN) 10 mg 12 hr tablet Take 1 tablet (10 mg total) by mouth every 12 (twelve) hours. May refill 29 days after prior prescription 60 tablet 0   potassium chloride (KLOR-CON M) 10 MEQ tablet TAKE 1 TABLET TWICE A DAY 180 tablet 0   predniSONE (DELTASONE) 1 MG tablet TAKE 4 TABLETS DAILY WITH BREAKFAST (Patient taking differently: Take 4 mg by mouth daily with breakfast.) 360 tablet 3   vitamin B-12  (CYANOCOBALAMIN) 100 MCG tablet Take 100 mcg by mouth daily.     atorvastatin (LIPITOR) 40 MG tablet Take 40 mg by mouth daily. (Patient not taking: Reported on 12/20/2022)     oxyCODONE (OXYCONTIN) 10 mg 12 hr tablet Take 1 tablet (10 mg total) by mouth every 12 (twelve) hours. (Patient not taking: Reported on 12/20/2022) 60 tablet 0   oxyCODONE (OXYCONTIN) 10 mg 12 hr tablet Take 1 tablet (10 mg total) by mouth every 12 (twelve) hours. (Patient not taking: Reported on 12/20/2022) 60 tablet 0   Probiotic, Lactobacillus, CAPS Take 1 each by mouth daily. 30 capsule 0   No current facility-administered medications for this visit.    PAST MEDICAL HISTORY: Past Medical History:  Diagnosis Date   Allergic rhinitis    Anemia    Anxiety    Bronchiectasis    Carpal tunnel syndrome 07/13/2015   Bilateral   CVA (cerebral infarction) 10/2007   Right thalamic    Diverticulosis of colon    Gait disorder    GERD (gastroesophageal reflux disease)    History of breast cancer    HTN (hypertension)    Hyperlipidemia    Idiopathic pulmonary fibrosis    Left knee DJD    Migraine    Osteoporosis    Peripheral neuropathy    PMR (polymyalgia rheumatica) (HCC)    Polyneuropathy in other diseases classified elsewhere ([place]) 02/17/2013   Previous back surgery 10/09/12   Renal insufficiency    Rheumatoid arthritis (HCC)    Seizure disorder (HCC)    Spondylosis, cervical, with myelopathy 08/31/2015   C3-4 myelopathy   Temporal arteritis (HCC)    Right eye blind, on steroids per [facility]/ Dr [name]   Type II or unspecified type diabetes mellitus without mention of complication, not stated as uncontrolled    2nd to steriods   Vitamin D deficiency     PAST SURGICAL HISTORY: Past Surgical History:  Procedure Laterality Date   APPENDECTOMY  01/2021   CARPAL TUNNEL RELEASE Right    CATARACT EXTRACTION     OS - Summer 2010   CERVICAL FUSION  09/24/2008   4 rods and pins in place   CHOLECYSTECTOMY      COLONOSCOPY  12/15/2011   Procedure: COLONOSCOPY;  Surgeon: [name], MD;  Location: [facility];  Service: Endoscopy;  Laterality: N/A;   LUMBAR FUSION  04/24/2010   W/Mechanical fixation - [facility]   LUMBAR FUSION  09/25/2011   rods in hips (to stabilize).   MASTECTOMY  NECK SURGERY     rheumatoid nodule removal     SPINAL FUSION  07/25/2010   T10-L2 interbody fusion / [place] ARTHROPLASTY     Left    FAMILY HISTORY: Family History  Problem Relation Age of Onset   Brain cancer Mother    Hypertension Mother    Arthritis Mother    Dementia Father    Hypertension Father    Arthritis Father    Breast cancer Sister    Lung cancer Sister    Lung disease Neg Hx    Rheumatologic disease Neg Hx     SOCIAL HISTORY: Social History   Socioeconomic History   Marital status: Widowed    Spouse name: Not on file   Number of children: 1   Years of education: 12+ coll.   Highest education level: Not on file  Occupational History   Occupation: Education officer, environmental: RETIRED    Comment: retired  Tobacco Use   Smoking status: Never   Smokeless tobacco: Never   Tobacco comments:    Father   Vaping Use   Vaping Use: Never used  Substance and Sexual Activity   Alcohol use: Not Currently    Comment: heavy drinker until 1995 - sobriety with AA   Drug use: No   Sexual activity: Not Currently  Other Topics Concern   Not on file  Social History Narrative   07/03/21 friend [name] living with her   Right handed   Drinks 6-8 cups caffeine daily      Social Determinants of Health   Financial Resource Strain: Low Risk  (11/21/2022)   Overall Financial Resource Strain (CARDIA)    Difficulty of Paying Living Expenses: Not very hard  Food Insecurity: No Food Insecurity (11/21/2022)   Hunger Vital Sign    Worried About Running Out of Food in the Last Year: Never true    [place]  in the Last Year: Never true  Transportation Needs: No Transportation Needs (09/25/2022)   PRAPARE - Hydrologist (Medical): No    Lack of Transportation (Non-Medical): No  Physical Activity: Inactive (06/25/2022)   Exercise Vital Sign    Days of Exercise per Week: 0 days    Minutes of Exercise per Session: 0 min  Stress: No Stress Concern Present (06/25/2022)   [place]    Feeling of Stress : Not at all  Social Connections: Moderately Integrated (06/25/2022)   Social Connection and Isolation Panel [NHANES]    Frequency of Communication with Friends and Family: More than three times a week    Frequency of Social Gatherings with Friends and Family: More than three times a week    Attends Religious Services: More than 4 times per year    Active Member of [employer] or Organizations: Yes    Attends Archivist Meetings: More than 4 times per year    Marital Status: Widowed  Intimate Partner Violence: Not At Risk (06/25/2022)   Humiliation, Afraid, Rape, and Kick questionnaire    Fear of Current or Ex-Partner: No    Emotionally Abused: No    Physically Abused: No    Sexually Abused: No   Butler Denmark, Laqueta Jean, DNP  [facility] [address] [place], [place] [id number] [phone]

## 2022-12-25 ENCOUNTER — Ambulatory Visit (INDEPENDENT_AMBULATORY_CARE_PROVIDER_SITE_OTHER): Payer: Medicare Other | Admitting: Family

## 2022-12-25 VITALS — BP 146/68 | HR 76 | Temp 98.1°F | Ht 61.0 in | Wt 131.0 lb

## 2022-12-25 DIAGNOSIS — M5137 Other intervertebral disc degeneration, lumbosacral region: Secondary | ICD-10-CM

## 2022-12-25 DIAGNOSIS — E782 Mixed hyperlipidemia: Secondary | ICD-10-CM

## 2022-12-25 DIAGNOSIS — M0589 Other rheumatoid arthritis with rheumatoid factor of multiple sites: Secondary | ICD-10-CM | POA: Diagnosis not present

## 2022-12-25 DIAGNOSIS — I10 Essential (primary) hypertension: Secondary | ICD-10-CM

## 2022-12-25 DIAGNOSIS — K219 Gastro-esophageal reflux disease without esophagitis: Secondary | ICD-10-CM

## 2022-12-25 MED ORDER — OXYCODONE HCL ER 10 MG PO T12A: 10.0000 mg | EXTENDED_RELEASE_TABLET | Freq: Two times a day (BID) | ORAL | 0 refills | Status: DC

## 2022-12-26 ENCOUNTER — Encounter: Payer: Self-pay | Admitting: Podiatry

## 2022-12-26 ENCOUNTER — Ambulatory Visit (INDEPENDENT_AMBULATORY_CARE_PROVIDER_SITE_OTHER): Payer: Medicare Other

## 2022-12-26 ENCOUNTER — Ambulatory Visit: Payer: Medicare Other | Admitting: Family Medicine

## 2022-12-26 ENCOUNTER — Ambulatory Visit (INDEPENDENT_AMBULATORY_CARE_PROVIDER_SITE_OTHER): Payer: Medicare Other | Admitting: Podiatry

## 2022-12-26 DIAGNOSIS — M898X7 Other specified disorders of bone, ankle and foot: Secondary | ICD-10-CM

## 2022-12-26 NOTE — Progress Notes (Signed)
Established Patient Office Visit  Subjective   Patient ID: [name], female    DOB: 10/21/1940  Age: 82 y.o. MRN: [id number]  Chief Complaint  Patient presents with  . Medical Management of Chronic Issues    HPI  82 year old female, patient of Dr. Elease Hashimoto presents today for a refill of there Oxycontin. She reports taking the medication twice a day, everyday except when she is getting close to running out. She reports the mail order company is usually delayed with delivering her medication so she has increased back pain around that time. Otherwise she is stable on her medication.   Review of Systems  Musculoskeletal:  Positive for back pain. Negative for falls and joint pain.  All other systems reviewed and are negative. Past Medical History:  Diagnosis Date  . Allergic rhinitis   . Anemia   . Anxiety   . Bronchiectasis   . Carpal tunnel syndrome 07/13/2015   Bilateral  . CVA (cerebral infarction) 10/2007   Right thalamic   . Diverticulosis of colon   . Gait disorder   . GERD (gastroesophageal reflux disease)   . History of breast cancer   . HTN (hypertension)   . Hyperlipidemia   . Idiopathic pulmonary fibrosis   . Left knee DJD   . Migraine   . Osteoporosis   . Peripheral neuropathy   . PMR (polymyalgia rheumatica)   . Polyneuropathy in other diseases classified elsewhere 02/17/2013  . Previous back surgery 10/09/12  . Renal insufficiency   . Rheumatoid arthritis   . Seizure disorder   . Spondylosis, cervical, with myelopathy 08/31/2015   C3-4 myelopathy  . Temporal arteritis    Right eye blind, on steroids per [facility]/ Dr [name]  . Type II or unspecified type diabetes mellitus without mention of complication, not stated as uncontrolled    2nd to steriods  . Vitamin D deficiency     Social History   Socioeconomic History  . Marital status: Widowed    Spouse name: Not on file  . Number of children: 1  . Years of education: 12+ coll.  . Highest  education level: Not on file  Occupational History  . Occupation: Education officer, environmental: RETIRED    Comment: retired  Tobacco Use  . Smoking status: Never  . Smokeless tobacco: Never  . Tobacco comments:    Father   Vaping Use  . Vaping Use: Never used  Substance and Sexual Activity  . Alcohol use: Not Currently    Comment: heavy drinker until 1995 - sobriety with AA  . Drug use: No  . Sexual activity: Not Currently  Other Topics Concern  . Not on file  Social History Narrative   07/03/21 friend [name] living with her   Right handed   Drinks 6-8 cups caffeine daily      Social Determinants of Health   Financial Resource Strain: Low Risk  (11/21/2022)   Overall Financial Resource Strain (CARDIA)   . Difficulty of Paying Living Expenses: Not very hard  Food Insecurity: No Food Insecurity (11/21/2022)   Hunger Vital Sign   . Worried About Charity fundraiser in the Last Year: Never true   . Ran Out of Food in the Last Year: Never true  Transportation Needs: No Transportation Needs (09/25/2022)   PRAPARE - Transportation   . Lack of Transportation (Medical): No   . Lack of Transportation (Non-Medical): No  Physical Activity: Inactive (06/25/2022)  Exercise Vital Sign   . Days of Exercise per Week: 0 days   . Minutes of Exercise per Session: 0 min  Stress: No Stress Concern Present (06/25/2022)   [place]   . Feeling of Stress : Not at all  Social Connections: Moderately Integrated (06/25/2022)   Social Connection and Isolation Panel [NHANES]   . Frequency of Communication with Friends and Family: More than three times a week   . Frequency of Social Gatherings with Friends and Family: More than three times a week   . Attends Religious Services: More than 4 times per year   . Active Member of Clubs or Organizations: Yes   . Attends Archivist Meetings: More than  4 times per year   . Marital Status: Widowed  Intimate Partner Violence: Not At Risk (06/25/2022)   Humiliation, Afraid, Rape, and Kick questionnaire   . Fear of Current or Ex-Partner: No   . Emotionally Abused: No   . Physically Abused: No   . Sexually Abused: No    Past Surgical History:  Procedure Laterality Date  . APPENDECTOMY  01/2021  . CARPAL TUNNEL RELEASE Right   . CATARACT EXTRACTION     OS - Summer 2010  . CERVICAL FUSION  09/24/2008   4 rods and pins in place  . CHOLECYSTECTOMY    . COLONOSCOPY  12/15/2011   Procedure: COLONOSCOPY;  Surgeon: [name], MD;  Location: [facility];  Service: Endoscopy;  Laterality: N/A;  . LUMBAR FUSION  04/24/2010   W/Mechanical fixation - [place]  09/25/2011   rods in hips (to stabilize).  Marland Kitchen MASTECTOMY    . NECK SURGERY    . rheumatoid nodule removal    . SPINAL FUSION  07/25/2010   T10-L2 interbody fusion / [facility]  . TONSILLECTOMY    . TOTAL KNEE ARTHROPLASTY     Left    Family History  Problem Relation Age of Onset  . Brain cancer Mother   . Hypertension Mother   . Arthritis Mother   . Dementia Father   . Hypertension Father   . Arthritis Father   . Breast cancer Sister   . Lung cancer Sister   . Lung disease Neg Hx   . Rheumatologic disease Neg Hx     Allergies  Allergen Reactions  . Hydromorphone Other (See Comments)    Cognitive changes  "made me unconscious" Dilaudid    Current Outpatient Medications on File Prior to Visit  Medication Sig Dispense Refill  . amLODipine (NORVASC) 5 MG tablet TAKE 1 TABLET DAILY 90 tablet 3  . aspirin EC 81 MG tablet Take 1 tablet (81 mg total) by mouth daily. Swallow whole. 30 tablet 11  . atorvastatin (LIPITOR) 40 MG tablet Take 1 tablet (40 mg total) by mouth daily. 90 tablet 3  . atorvastatin (LIPITOR) 40 MG tablet Take 40 mg by mouth daily. (Patient not taking: Reported on 12/20/2022)    . Calcium Carb-Cholecalciferol  (CALCIUM CARBONATE-VITAMIN D3 PO) Take 1 tablet by mouth daily    . cholecalciferol (VITAMIN D) 1000 UNITS tablet Take 1,000 Units by mouth daily.    Marland Kitchen denosumab (PROLIA) 60 MG/ML SOLN injection Inject 60 mg into the skin every 6 (six) months.    . docusate sodium (COLACE) 100 MG capsule Take 100 mg by mouth daily.    . furosemide (LASIX) 40 MG tablet TAKE 1 TABLET DAILY 90 tablet 3  .  gabapentin (NEURONTIN) 300 MG capsule TAKE 1 CAPSULE TWICE A DAY 360 capsule 3  . Golimumab ([place] ARIA IV) Inject into the vein. Takes infusion every 8 wks.    . Multiple Vitamins-Minerals (MULTIVITAMIN,TX-MINERALS) tablet Take 1 tablet by mouth daily.    . Omega-3 Fatty Acids (FISH OIL OMEGA-3 PO) Take 1 capsule by mouth daily.    Marland Kitchen oxyCODONE (OXYCONTIN) 10 mg 12 hr tablet Take 1 tablet (10 mg total) by mouth every 12 (twelve) hours. (Patient not taking: Reported on 12/20/2022) 60 tablet 0  . oxyCODONE (OXYCONTIN) 10 mg 12 hr tablet Take 1 tablet (10 mg total) by mouth every 12 (twelve) hours. May refill 29 days after prior prescription 60 tablet 0  . potassium chloride (KLOR-CON M) 10 MEQ tablet TAKE 1 TABLET TWICE A DAY 180 tablet 0  . predniSONE (DELTASONE) 1 MG tablet TAKE 4 TABLETS DAILY WITH BREAKFAST 360 tablet 3  . Probiotic, Lactobacillus, CAPS Take 1 each by mouth daily. 30 capsule 0  . vitamin B-12 (CYANOCOBALAMIN) 100 MCG tablet Take 100 mcg by mouth daily.     No current facility-administered medications on file prior to visit.    BP (!) 146/68 (BP Location: Right Arm, Patient Position: Sitting, Cuff Size: Normal)   Pulse 76   Temp 98.1 F (36.7 C) (Oral)   Ht 5\' 1"  (1.549 m)   Wt 131 lb (59.4 kg)   SpO2 97%   BMI 24.75 kg/m chart    Objective:     BP (!) 146/68 (BP Location: Right Arm, Patient Position: Sitting, Cuff Size: Normal)   Pulse 76   Temp 98.1 F (36.7 C) (Oral)   Ht 5\' 1"  (1.549 m)   Wt 131 lb (59.4 kg)   SpO2 97%   BMI 24.75 kg/m    Physical Exam Vitals and  nursing note reviewed.  Constitutional:      Appearance: Normal appearance. She is normal weight.  Cardiovascular:     Rate and Rhythm: Normal rate and regular rhythm.     Pulses: Normal pulses.     Heart sounds: Normal heart sounds.  Pulmonary:     Effort: Pulmonary effort is normal.     Breath sounds: Normal breath sounds.  Abdominal:     General: Abdomen is flat.     Palpations: Abdomen is soft.  Musculoskeletal:        General: Normal range of motion.     Comments: Ambulates with a walker  Skin:    General: Skin is warm and dry.  Neurological:     General: No focal deficit present.     Mental Status: She is alert and oriented to person, place, and time. Mental status is at baseline.  Psychiatric:        Mood and Affect: Mood normal.        Behavior: Behavior normal.    No results found for any visits on 12/25/22.    The ASCVD Risk score (Arnett DK, et al., 2019) failed to calculate for the following reasons:   The 2019 ASCVD risk score is only valid for ages 53 to 69    Assessment & Plan:   Problem List Items Addressed This Visit     DDD (degenerative disc disease), lumbosacral - Primary   Relevant Medications   oxyCODONE (OXYCONTIN) 10 mg 12 hr tablet  Recheck in 3 month, fasting to recheck chronic health conditions. Call with any questions or concerns.   Return in about 3 months (around 03/26/2023).  Kennyth Arnold, FNP

## 2022-12-26 NOTE — Progress Notes (Signed)
Subjective:   Patient ID: [name], female   DOB: 82 y.o.   MRN: [id number]   HPI Patient states doing very well with surgery very pleased   ROS      Objective:  Physical Exam  Neurovascular status intact negative Bevelyn Buckles' sign noted patient's left foot is healing well and the area of breakdown has completely healed     Assessment:  Overall doing well no indications of current pathology     Plan:  Recommended ice therapy and cushioning for this but at this point I am so happy with how she is doing with discharging her she will be seen back as needed  X-rays indicate satisfactory resection of bone where she had this area pathology

## 2022-12-27 ENCOUNTER — Telehealth: Payer: Self-pay

## 2022-12-27 NOTE — Progress Notes (Signed)
Patient ID: [name] DurieCarolyn W Mancini, female   DOB: [birth year], 82 y.o.   MRN: [id number]  Care Management & Coordination Services Pharmacy Team  Reason for Encounter: Hypertension  Contacted patient to discuss hypertension disease state. Spoke with patient on 12/31/2022     Current antihypertensive regimen:  Amlodipine 5mg  1 qd  Lasix 40mg  1 qd  Klor-Con 10mEq 1 BID   Patient verbally confirms she is taking the above medications as directed. Yes  How often are you checking your Blood Pressure? 1-2 times a week  she checks her blood pressure in the afternoon after taking her medication.  Current home BP readings: 125/80, patient stated she has been watching her sodium in her meals and has been keeping a watchful eye on her pressures she reports she is currently out of town and will try and remember to call me when she comes back with more readings. She denies any symptoms of hypotension and reports her pressures have remained around the above reading.   Any readings above 180/100? No If yes any symptoms of hypertensive emergency? patient denies any symptoms of high blood pressure    Adherence Review: Is the patient currently on ACE/ARB medication? Yes Does the patient have >5 day gap between last estimated fill dates? No  Star Rating Drugs:  Atorvastatin 40 mg- [facility]/26/22 90 [facility]   Chart Updates: Recent office visits:  12/25/22 Eulis FosterWebb, Padonda B, FNP - Patient presented for DDD and other concerns. No medication changes.  11/30/22 Burchette, Elberta FortisBruce W, MD - Patient presented for Air hunger and other concerns. No medication changes.   Recent consult visits:  12/26/22 Lenn Sinkegal, Norman S, DPM (Podiatry) - Patient presented for Exostosis of left foot. No medication changes. X rays ordered.  12/20/22 Glean SalvoSlack, Sarah J, NP (Neurology) - Patient presented for Temporal arteritis and other concerns. No medication changes.   11/26/22 Prevette, Redmond SchoolAshley E, PMAC (Podiatry) - Patient presented for  Exostosis of left foot and other concerns. No medication changes.   Hospital visits:  Medication Reconciliation was completed by comparing discharge summary, patient's EMR and Pharmacy list, and upon discussion with patient.   [facility] on  08/02/22 due to Dizziness. Patient was present for 26 hours.   [facility]?? -started  Asprin 81 mg Atorvastatin 40 mg Clopidogrel 75 mg Guaifenesin 600 mg    Medication Changes at Hospital Discharge: -Changed  none   Medications Discontinued at Hospital Discharge: -Stopped  Benzonatate 100 mg Flonase 50 mcg Oxycodone 10 mg   Medications that remain the same after Hospital Discharge:??  -All other medications will remain the same.       Medication Reconciliation was completed by comparing discharge summary, patient's EMR and Pharmacy list, and upon discussion with patient.   [facility] 07/30/22 due to Wound Cellulitis. Patient was present for 2 hours.   New?Medications Started at [facility] Discharge:?? -started  Clindamycin 300 mg Lactobacillus   Medication Changes at Hospital Discharge: -Changed  none   Medications Discontinued at Hospital Discharge: -Stopped  none   Medications that remain the same after Hospital Discharge:??  -All other medications will remain the same.   Medication Reconciliation was completed by comparing discharge summary, patient's EMR and Pharmacy list, and upon discussion with patient.     [facility] 07[facility]22 due to Hematoma Patient was present for 1 hours.  New?Medications Started at [facility] Discharge:?? -started  none   Medication Changes at Hospital Discharge: -Changed  none   Medications Discontinued at Hospital Discharge: -Stopped  none   Medications that remain the same after Hospital Discharge:??  -All  other medications will remain the same.    Medication Reconciliation was completed by comparing discharge summary, patient's EMR and Pharmacy list, and upon discussion with patient.   Patient presented to [facility] ED at [facility] on 07/09/22 due to Leg hematoma and other concerns. Patient was present for 59 min.   New?Medications Started at [facility] Discharge:?? -started  none   Medication Changes at Hospital Discharge: -Changed  none   Medications Discontinued at Hospital Discharge: -Stopped  none   Medications that remain the same after Hospital Discharge:??  -All other medications will remain the same.    Medications: Outpatient Encounter Medications as of 12/27/2022  Medication Sig   amLODipine (NORVASC) 5 MG tablet TAKE 1 TABLET DAILY   aspirin EC 81 MG tablet Take 1 tablet (81 mg total) by mouth daily. Swallow whole.   atorvastatin (LIPITOR) 40 MG tablet Take 1 tablet (40 mg total) by mouth daily.   atorvastatin (LIPITOR) 40 MG tablet Take 40 mg by mouth daily. (Patient not taking: Reported on 12/20/2022)   Calcium Carb-Cholecalciferol (CALCIUM CARBONATE-VITAMIN D3 PO) Take 1 tablet by mouth daily   cholecalciferol (VITAMIN D) 1000 UNITS tablet Take 1,000 Units by mouth daily.   denosumab (PROLIA) 60 MG/ML SOLN injection Inject 60 mg into the skin every 6 (six) months.   docusate sodium (COLACE) 100 MG capsule Take 100 mg by mouth daily.   furosemide (LASIX) 40 MG tablet TAKE 1 TABLET DAILY   gabapentin (NEURONTIN) 300 MG capsule TAKE 1 CAPSULE TWICE A DAY   Golimumab (SIMPONI ARIA IV) Inject into the vein. Takes infusion every 8 wks.   Multiple Vitamins-Minerals (MULTIVITAMIN,TX-MINERALS) tablet Take 1 tablet by mouth daily.   Omega-3 Fatty Acids (FISH OIL OMEGA-3 PO) Take 1 capsule by mouth daily.   oxyCODONE (OXYCONTIN) 10 mg 12 hr tablet Take 1 tablet (10 mg total) by mouth every 12 (twelve) hours. (Patient not taking: Reported on 12/20/2022)   oxyCODONE  (OXYCONTIN) 10 mg 12 hr tablet Take 1 tablet (10 mg total) by mouth every 12 (twelve) hours. May refill 29 days after prior prescription   oxyCODONE (OXYCONTIN) 10 mg 12 hr tablet Take 1 tablet (10 mg total) by mouth every 12 (twelve) hours.   potassium chloride (KLOR-CON M) 10 MEQ tablet TAKE 1 TABLET TWICE A DAY   predniSONE (DELTASONE) 1 MG tablet TAKE 4 TABLETS DAILY WITH BREAKFAST   Probiotic, Lactobacillus, CAPS Take 1 each by mouth daily.   vitamin B-12 (CYANOCOBALAMIN) 100 MCG tablet Take 100 mcg by mouth daily.   No facility-administered encounter medications on file as of 12/27/2022.    Recent Office Vitals: BP Readings from Last 3 Encounters:  12/25/22 (!) 146/68  12/20/22 134/85  11/30/22 136/60   Pulse Readings from Last 3 Encounters:  12/25/22 76  12/20/22 73  11/30/22 82    Wt Readings from Last 3 Encounters:  12/25/22 131 lb (59.4 kg)  12/20/22 133 lb (60.3 kg)  11/30/22 135 lb 1.6 oz (61.3 kg)     Kidney Function Lab Results  Component Value Date/Time   CREATININE 1.29 (H) 08/02/2022 02:27 PM   CREATININE 1.51 (H) 07/30/2022 04:05 PM   GFR 42.93 (L) 09/04/2021 11:39 AM   GFRNONAA 41 (L) 08/02/2022 02:27 PM  GFRAA 40 (L) 09/04/2013 03:59 PM       Latest Ref Rng & Units 08/02/2022    2:27 PM 07/30/2022    4:05 PM 05/16/2022   12:00 AM  BMP  Glucose 70 - 99 mg/dL 456  256    BUN 8 - 23 mg/dL 15  25  19       Creatinine 0.44 - 1.00 mg/dL 3.89  3.73  1.2      Sodium 135 - 145 mmol/L 141  139  138      Potassium 3.5 - 5.1 mmol/L 3.7  4.1  4.0      Chloride 98 - 111 mmol/L 103  100  101      CO2 22 - 32 mmol/L 26  30  31       Calcium 8.9 - 10.3 mg/dL 8.1  42.8  9.5         This result is from an external source.        Pamala Duffel CMA Clinical Pharmacist Assistant [phone]

## 2022-12-28 ENCOUNTER — Ambulatory Visit: Payer: Medicare Other | Admitting: Podiatry

## 2023-01-23 DIAGNOSIS — M79645 Pain in left finger(s): Secondary | ICD-10-CM | POA: Diagnosis not present

## 2023-01-23 DIAGNOSIS — M810 Age-related osteoporosis without current pathological fracture: Secondary | ICD-10-CM | POA: Diagnosis not present

## 2023-01-23 DIAGNOSIS — M0589 Other rheumatoid arthritis with rheumatoid factor of multiple sites: Secondary | ICD-10-CM | POA: Diagnosis not present

## 2023-01-23 DIAGNOSIS — Z6823 Body mass index (BMI) 23.0-23.9, adult: Secondary | ICD-10-CM | POA: Diagnosis not present

## 2023-01-23 DIAGNOSIS — M316 Other giant cell arteritis: Secondary | ICD-10-CM | POA: Diagnosis not present

## 2023-01-23 DIAGNOSIS — M79672 Pain in left foot: Secondary | ICD-10-CM | POA: Diagnosis not present

## 2023-01-23 DIAGNOSIS — N184 Chronic kidney disease, stage 4 (severe): Secondary | ICD-10-CM | POA: Diagnosis not present

## 2023-01-23 DIAGNOSIS — M5136 Other intervertebral disc degeneration, lumbar region: Secondary | ICD-10-CM | POA: Diagnosis not present

## 2023-01-23 DIAGNOSIS — M1991 Primary osteoarthritis, unspecified site: Secondary | ICD-10-CM | POA: Diagnosis not present

## 2023-01-23 DIAGNOSIS — J8410 Pulmonary fibrosis, unspecified: Secondary | ICD-10-CM | POA: Diagnosis not present

## 2023-01-29 DIAGNOSIS — M810 Age-related osteoporosis without current pathological fracture: Secondary | ICD-10-CM | POA: Diagnosis not present

## 2023-01-29 DIAGNOSIS — M0589 Other rheumatoid arthritis with rheumatoid factor of multiple sites: Secondary | ICD-10-CM | POA: Diagnosis not present

## 2023-02-08 DIAGNOSIS — D3132 Benign neoplasm of left choroid: Secondary | ICD-10-CM | POA: Diagnosis not present

## 2023-02-08 DIAGNOSIS — H04122 Dry eye syndrome of left lacrimal gland: Secondary | ICD-10-CM | POA: Diagnosis not present

## 2023-02-08 DIAGNOSIS — H04212 Epiphora due to excess lacrimation, left lacrimal gland: Secondary | ICD-10-CM | POA: Diagnosis not present

## 2023-02-19 ENCOUNTER — Ambulatory Visit: Payer: Medicare Other | Admitting: Family Medicine

## 2023-02-20 ENCOUNTER — Telehealth: Payer: Self-pay | Admitting: Family Medicine

## 2023-02-20 DIAGNOSIS — M0589 Other rheumatoid arthritis with rheumatoid factor of multiple sites: Secondary | ICD-10-CM | POA: Diagnosis not present

## 2023-02-20 NOTE — Telephone Encounter (Signed)
I spoke with the patient and she was inquiring why PCP prescribed Atorvastatin 40 mg. Patient reported that she read information regarding medication side effects and stated that she would be discontinuing medication. Patient has been scheduled with PCP to discuss medication and whether or not she should continue taking.

## 2023-02-20 NOTE — Telephone Encounter (Signed)
Pt called in, she is requesting for someone from the careteam to call her to go over her most recent cholesterol readings. Call back number: [phone].

## 2023-02-21 ENCOUNTER — Other Ambulatory Visit: Payer: Self-pay | Admitting: Family Medicine

## 2023-02-21 NOTE — Progress Notes (Signed)
Care Management & Coordination Services Pharmacy Note  02/26/2023 Name:  [name] MRN:  [id number] DOB:  1941/07/29  Summary: BP at goal <140/90 Pt wants to see how accurate her BP cuff is and plans to bring to office for accuracy check LDL elevated >70 - stopped statin   Recommendations/Changes made from today's visit: -Check BP once 2-3x/week and keep a log. Counseled on signs of hypo/hypertension and stroke. -Recommended zetia or crestor to replace the lipitor to see how patient tolerates, patient declines today. -Counseled to be mindful of fried fatty foods. [facility]   Follow up plan: BP call in 2 months Pharmacist visit in 3 months   Subjective: [name] is an 82 y.o. year old female who is a primary patient of Burchette, Elberta Fortis, MD.  The care coordination team was consulted for assistance with disease management and care coordination needs.    Engaged with patient face to face for initial visit. Her friend [name] is present for visit, as pt is still recovering from foot surgery, has a boot on and has not been cleared to drive. Patient brings meds to appt, is very organized, and knows what her medications are for. Takes extensive notes during the appt and understands all info discussed.  Primary complaint today is she has begun to experience periods of shortness of breath that she cannot recall when it started, is not sure if it is stress/anxiety related or if her pulmonary fibrosis is worsening. Sch appt with PCP for 3/8 for eval, discussed coping mechanisms for stress/anxiety in the meantime.  Recent office visits: 12/25/22 Eulis Foster, FNP - Patient presented for DDD and other concerns. No medication changes.   11/30/22 Evelena Peat, MD - For air hunger, anxiety, eye trauma. No med changes.  10/15/22 Burchette, Elberta Fortis, MD - Patient presented for Unilateral headache and other concerns. No medication  changes.   09/26/22 Burchette, Elberta Fortis, MD - Patient presented for Chronic bilateral low back pain without sciatica and other concerns. Stopped Doxycycline.     Recent consult visits: 01/29/23 Alben Deeds, MD (Rheumo) -START Prolia  12/26/22 Regal, Kirstie Peri, DPM (Podiatry) - Patient presented for Exostosis of left foot. No medication changes. X rays ordered.    12/20/22 Buel Ream, NP (Neurology)- Temporal arteritis. No med changes  11/26/22 Ashely Prevette, PMAC (Foot and Ankle) - For exostosis of left foot. No med changes  11/12/22 Lenn Sink, DPM (Podiatry) - Patient presented for Exostosis of left foot and other concerns. No medication changes.   11/08/22 Alben Deeds (Rheumatology) - Patient presented for Follow up . No other visit details available.   11/07/22 Lenn Sink, DPM (Podiatry) - Patient presented for Post op state. No medication changes.    10/30/22  Alben Deeds (Rheumatology) - Patient presented for Rheumatoid arthritis and other concerns. No medication changes noted.   10/29/22 Lenn Sink, DPM  (Podiatry) - Patient presented for Capsulitis of left foot and other concerns. Prescribed Norco   10/05/22 Duanne Guess, MD (Wound Clinic) - Patient presented for wound care of midfoot. No medication changes.   09/12/22 Levert Feinstein, MD (Neurology) - Patient presented for confusion. EEG done no medication changes.    09/05/22 Maxwell Caul, MD (Wound Center) - Patient presented for wound care. No medication changes.    09/04/22 J[facility] (Rheumatology) - Patient presented for rheumatoid arthritis with rheumatoid factor of multiple sites and other concerns.  No medication  changes.      Hospital visits:    Objective:  Lab Results  Component Value Date   CREATININE 1.29 (H) 08/02/2022   BUN 15 08/02/2022   GFR 42.93 (L) 09/04/2021   EGFR 46 05/16/2022   GFRNONAA 41 (L) 08/02/2022   GFRAA 40 (L) 09/04/2013   NA 141 08/02/2022   K 3.7 08/02/2022    CALCIUM 8.1 (L) 08/02/2022   CO2 26 08/02/2022   GLUCOSE 103 (H) 08/02/2022    Lab Results  Component Value Date/Time   HGBA1C 5.0 08/03/2022 02:15 AM   HGBA1C 5.3 10/30/2017 11:08 AM   HGBA1C 5.6 06/13/2015 03:09 PM   GFR 42.93 (L) 09/04/2021 11:39 AM   GFR 37.61 (L) 07/14/2021 09:52 AM    Last diabetic Eye exam: No results found for: "HMDIABEYEEXA"  Last diabetic Foot exam: No results found for: "HMDIABFOOTEX"   Lab Results  Component Value Date   CHOL 143 08/03/2022   HDL 47 08/03/2022   LDLCALC 85 08/03/2022   TRIG 56 08/03/2022   CHOLHDL 3.0 08/03/2022       Latest Ref Rng & Units 08/02/2022    2:27 PM 07/30/2022    4:05 PM 05/16/2022   12:00 AM  Hepatic Function  Total Protein 6.5 - 8.1 g/dL 7.0  7.6    Albumin 3.5 - 5.0 g/dL 3.4  4.2  4.2      AST 15 - 41 U/L 21  22    ALT 0 - 44 U/L 14  12    Alk Phosphatase 38 - 126 U/L 66  72    Total Bilirubin 0.3 - 1.2 mg/dL 0.9  0.7       This result is from an external source.    Lab Results  Component Value Date/Time   TSH 1.512 08/02/2022 09:29 PM   TSH 3.17 07/14/2021 09:52 AM   TSH 2.13 01/08/2020 12:04 PM       Latest Ref Rng & Units 08/02/2022    2:27 PM 07/30/2022    4:05 PM 05/16/2022   12:00 AM  CBC  WBC 4.0 - 10.5 K/uL 8.3  8.6  8.3      Hemoglobin 12.0 - 15.0 g/dL 16.1  09.6  04.5      Hematocrit 36.0 - 46.0 % 35.6  41.1  41      Platelets 150 - 400 K/uL 258  259  217         This result is from an external source.    Lab Results  Component Value Date/Time   VITAMINB12 351 07/14/2021 09:52 AM   VITAMINB12 508 03/16/2020 10:27 AM    Clinical ASCVD: Yes  The ASCVD Risk score (Arnett DK, et al., 2019) failed to calculate for the following reasons:   The 2019 ASCVD risk score is only valid for ages 61 to 62        09/25/2022    1:50 PM 06/25/2022   11:03 AM 01/15/2022   11:17 AM  Depression screen PHQ 2/9  Decreased Interest 0 0 0  Down, Depressed, Hopeless 0 0 0  PHQ - 2 Score 0 0 0      Social History   Tobacco Use  Smoking Status Never  Smokeless Tobacco Never  Tobacco Comments   Father    BP Readings from Last 3 Encounters:  02/26/23 132/68  12/25/22 (!) 146/68  12/20/22 134/85   Pulse Readings from Last 3 Encounters:  12/25/22 76  12/20/22 73  11/30/22 82  Wt Readings from Last 3 Encounters:  12/25/22 131 lb (59.4 kg)  12/20/22 133 lb (60.3 kg)  11/30/22 135 lb 1.6 oz (61.3 kg)   BMI Readings from Last 3 Encounters:  12/25/22 24.75 kg/m  12/20/22 25.13 kg/m  11/30/22 25.53 kg/m    Allergies  Allergen Reactions   Hydromorphone Other (See Comments)    Cognitive changes  "made me unconscious" Dilaudid    Medications Reviewed Today     Reviewed by Eulis Foster, FNP (Family Nurse Practitioner) on 12/26/22 at 2107  Med List Status: <None>   Medication Order Taking? Sig Documenting Provider Last Dose Status Informant  amLODipine (NORVASC) 5 MG tablet [id number] No TAKE 1 TABLET DAILY Burchette, Elberta Fortis, MD Taking Active   aspirin EC 81 MG tablet [id number] No Take 1 tablet (81 mg total) by mouth daily. Swallow whole. Osvaldo Shipper, MD Taking Active   atorvastatin (LIPITOR) 40 MG tablet [id number] No Take 1 tablet (40 mg total) by mouth daily. Kristian Covey, MD Taking Active   atorvastatin (LIPITOR) 40 MG tablet [id number] No Take 40 mg by mouth daily.  Patient not taking: Reported on 12/20/2022   [provider] Not Taking Active   Calcium Carb-Cholecalciferol (CALCIUM CARBONATE-VITAMIN D3 PO) [id number] No Take 1 tablet by mouth daily [provider] Taking Active Self, Pharmacy Records  cholecalciferol (VITAMIN D) 1000 UNITS tablet [id number] No Take 1,000 Units by mouth daily. [provider] Taking Active Self, Pharmacy Records  denosumab (PROLIA) 60 MG/ML SOLN injection [id number] No Inject 60 mg into the skin every 6 (six) months. [provider] Taking Active Self, Pharmacy Records           Med Note  (COX, HEATHER C   Fri Jun 14, 2017 11:28 AM)    docusate sodium (COLACE) 100 MG capsule [id number] No Take 100 mg by mouth daily. [provider] Taking Active Self, Pharmacy Records  furosemide (LASIX) 40 MG tablet [id number] No TAKE 1 TABLET DAILY Burchette, Elberta Fortis, MD Taking Active   gabapentin (NEURONTIN) 300 MG capsule [id number] No TAKE 1 CAPSULE TWICE A DAY Burchette, Elberta Fortis, MD Taking Active   Golimumab [facility] ARIA IV) [id number] No Inject into the vein. Takes infusion every 8 wks. [provider] Taking Active Self, Pharmacy Records  Multiple Vitamins-Minerals (MULTIVITAMIN,TX-MINERALS) tablet [id number] No Take 1 tablet by mouth daily. [provider] Taking Active Self, Pharmacy Records  Omega-3 Fatty Acids (FISH OIL OMEGA-3 PO) [id number] No Take 1 capsule by mouth daily. [provider] Taking Active Self, Pharmacy Records  oxyCODONE (OXYCONTIN) 10 mg 12 hr tablet [id number] No Take 1 tablet (10 mg total) by mouth every 12 (twelve) hours.  Patient not taking: Reported on 12/20/2022   Kristian Covey, MD Not Taking Active   oxyCODONE (OXYCONTIN) 10 mg 12 hr tablet [id number] No Take 1 tablet (10 mg total) by mouth every 12 (twelve) hours. May refill 29 days after prior prescription Burchette, Elberta Fortis, MD Taking Active   oxyCODONE (OXYCONTIN) 10 mg 12 hr tablet [id number]  Take 1 tablet (10 mg total) by mouth every 12 (twelve) hours. Eulis Foster, FNP  Active   potassium chloride (KLOR-CON M) 10 MEQ tablet [id number] No TAKE 1 TABLET TWICE A DAY Burchette, Elberta Fortis, MD Taking Active   predniSONE (DELTASONE) 1 MG tablet [id number]  TAKE 4 TABLETS DAILY WITH Rudell Cobb, NP  Active   Probiotic, Lactobacillus, CAPS [id number] No Take 1 each by mouth daily. Glyn Ade,  MD Taking Active Self, Pharmacy Records  vitamin B-12 (CYANOCOBALAMIN) 100 MCG tablet [id number] No Take 100 mcg by mouth daily. [provider] Taking Active Self,  Pharmacy Records            SDOH:  (Social Determinants of Health) assessments and interventions performed: Yes SDOH Interventions    Flowsheet Row Care Coordination from 02/26/2023 in [facility] Care Coordination from 11/21/2022 in [facility] Telephone from 09/25/2022 in [facility] Care Coordination Telephone from 07/17/2022 in [facility] Care Coordination Clinical Support from 06/25/2022 in [facility] Clinical Support from 06/22/2021 in [facility] [place]  SDOH Interventions        Food Insecurity Interventions Intervention Not Indicated Intervention Not Indicated Intervention Not Indicated -- Intervention Not Indicated Intervention Not Indicated  Housing Interventions Intervention Not Indicated Intervention Not Indicated Intervention Not Indicated Intervention Not Indicated Intervention Not Indicated Intervention Not Indicated  Transportation Interventions -- -- Intervention Not Indicated Intervention Not Indicated -- Intervention Not Indicated  Utilities Interventions -- Intervention Not Indicated Intervention Not Indicated -- Intervention Not Indicated --  Alcohol Usage Interventions -- -- -- -- Intervention Not Indicated (Score <7) --  Financial Strain Interventions -- Intervention Not Indicated -- -- Intervention Not Indicated Intervention Not Indicated  Physical Activity Interventions -- -- -- -- Intervention Not Indicated Intervention Not Indicated  Stress Interventions -- -- -- -- Intervention Not Indicated Intervention Not Indicated  Social Connections Interventions -- -- -- -- Intervention Not Indicated Intervention Not Indicated       Medication Assistance: None required.  Patient affirms current coverage meets needs.  Medication Access: Within the past 30 days, how often has patient missed a dose of medication? None Is a pillbox or other method  used to improve adherence? Yes  Factors that may affect medication adherence? no barriers identified Are meds synced by current pharmacy? No  Are meds delivered by current pharmacy? Yes  Does patient experience delays in picking up medications due to transportation concerns? No   [facility] Reviewed: Is patient disadvantaged to use [facility]?: Yes  Current Rx insurance plan: [employer] Name and location of Current pharmacy:  [id number] [place], [place] - [address] AT [facility] [address] [facility] [address] [facility] [address] [place] [address] Phone: [phone] Fax: [phone]  [facility] - [place], [place] - [address] [place] [address] Phone: [phone] Fax: [phone]  [facility] services reviewed with patient today?: No  Patient requests to transfer care to [facility]?: No  Reason patient declined to change pharmacies: Disadvantaged due to insurance/mail order  Compliance/Adherence/Medication fill history: Care Gaps: Eye Exam - Overdue -  not overdue, gets checked twice yearly Diabetic Urine - Overdue - no active diabetes on problem list, will discuss with PCP about potentially adding? Zoster Vaccine - Overdue Foot Exam - Overdue-see above for diabetes Covid Booster - Overdue - did not discuss vaccinations today, will address at next f/u appt in May  Star-Rating Drugs: None   Assessment/Plan   Hypertension (BP goal <140/90) -Controlled -Current treatment: Amlodipine 5mg  1 qd Appropriate, Effective, Safe, Accessible Lasix 40mg  1 qd Appropriate, Effective, Safe, Accessible Klor-Con 1 BID Appropriate, Effective, Safe, Accessible -Medications previously tried: Losartan  -Current home readings: 132/68, checking about every 3 days -Current dietary habits: mindful of salt -Current exercise habits: limited due to foot surgery at  this time, walking with a  walker and boot on left foot -Denies hypotensive/hypertensive symptoms -Educated on BP goals and benefits of medications for prevention of heart attack, stroke and kidney damage; Daily salt intake goal < 2300 mg; Exercise goal of 150 minutes per week; Importance of home blood pressure monitoring; Proper BP monitoring technique; Symptoms of hypotension and importance of maintaining adequate hydration; -Counseled to monitor BP at home once weekly or if experiencing sx of hypo/hypertension, document, and provide log at future appointments -Counseled on diet and exercise extensively Recommended to continue current medication -[facility]  Hyperlipidemia: (LDL goal < 70) -Not ideally controlled -Current treatment: Fish Oil 1000mg  qam Appropriate, Effective, Safe, Accessible -Medications previously tried: Simvastatin  -Current dietary patterns: not discussed -Current exercise habits: see above -Educated on Cholesterol goals;  Importance of limiting foods high in cholesterol; -Stopped statin about 10 days ago, felt it was the source of her low energy and anxiety and feels this has improved since stopping the statin -Future consideration: try crestor or zetia, patient wants to see how LDL may improve after increased activity and diet changes  Sherrill Raring Clinical Pharmacist [phone]

## 2023-02-25 ENCOUNTER — Telehealth: Payer: Self-pay

## 2023-02-25 NOTE — Progress Notes (Signed)
Patient ID: [name], female   DOB: 02-Jan-1941, 82 y.o.   MRN: [id number]  Care Management & Coordination Services Pharmacy Team  Reason for Encounter: Appointment Reminder  Contacted patient to confirm telephone appointment with Milas Kocher, PharmD on 02/26/23 at 10:30. Spoke with patient on 02/25/2023     Star Rating Drugs:  Atorvastatin 40 mg - [facility]/17/[facility]   Care Gaps: Eye Exam - Overdue Diabetic Urine - Overdue Zoster Vaccine - Overdue COVID Booster - Overdue A1C - Overdue AWV - 06/25/22   Pamala Duffel CMA Clinical Pharmacist Assistant [phone]

## 2023-02-26 ENCOUNTER — Ambulatory Visit: Payer: Medicare Other

## 2023-03-08 ENCOUNTER — Ambulatory Visit (INDEPENDENT_AMBULATORY_CARE_PROVIDER_SITE_OTHER): Payer: Medicare Other | Admitting: Family Medicine

## 2023-03-08 ENCOUNTER — Encounter: Payer: Self-pay | Admitting: Family Medicine

## 2023-03-08 VITALS — BP 128/70 | HR 77 | Temp 98.2°F | Ht 61.0 in | Wt 129.1 lb

## 2023-03-08 DIAGNOSIS — G8929 Other chronic pain: Secondary | ICD-10-CM

## 2023-03-08 DIAGNOSIS — I10 Essential (primary) hypertension: Secondary | ICD-10-CM | POA: Diagnosis not present

## 2023-03-08 DIAGNOSIS — T402X5A Adverse effect of other opioids, initial encounter: Secondary | ICD-10-CM

## 2023-03-08 DIAGNOSIS — M545 Low back pain, unspecified: Secondary | ICD-10-CM

## 2023-03-08 DIAGNOSIS — K5903 Drug induced constipation: Secondary | ICD-10-CM

## 2023-03-08 NOTE — Progress Notes (Unsigned)
Established Patient Office Visit  Subjective   Patient ID: [name], female    DOB: April 24, 1941  Age: 82 y.o. MRN: [id number]  Chief Complaint  Patient presents with   Medical Management of Chronic Issues    Follow up on BP. And cholesterol. Quit taking atorvastatin. Making her feels dizzy, nausea. Took it about 1 month.     HPI  {History (Optional):23778} [name] is seen today to discuss several items.  She has chronic problems including hypertension, history of temporal arteritis, history of questionable TIA, chronic back pain on chronic opioid therapy, history of breast cancer, chronic kidney disease.  She had been on statin with lovastatin and apparently had some side effects she suspected of dizziness and nausea.  She took herself off this.  She questions why she was on a statin to begin with.  Apparently he was started because of question of TIA although that was in some doubt.  She declines further statin use at this time.  Does not have any known cardiac disease terms of CAD.  She has chronic severe back pain which has been controlled for several years with Oxycontin 10 mg twice daily.  Her pain is fairly well-controlled but she has had some constipation issues.  Frequently has bowel movement every other day but has to strain some.  She tried to pick up her fluid intake.  Take stool softener twice daily.  No MiraLAX.  She has hypertension treated with amlodipine 5 mg daily and blood pressures have been stable.  No recent dizziness.  Past Medical History:  Diagnosis Date   Allergic rhinitis    Anemia    Anxiety    Bronchiectasis    Carpal tunnel syndrome 07/13/2015   Bilateral   CVA (cerebral infarction) 10/2007   Right thalamic    Diverticulosis of colon    Gait disorder    GERD (gastroesophageal reflux disease)    History of breast cancer    HTN (hypertension)    Hyperlipidemia    Idiopathic pulmonary fibrosis    Left knee DJD    Migraine    Osteoporosis     Peripheral neuropathy    PMR (polymyalgia rheumatica) (HCC)    Polyneuropathy in other diseases classified elsewhere (HCC) 02/17/2013   Previous back surgery 10/09/12   Renal insufficiency    Rheumatoid arthritis (HCC)    Seizure disorder (HCC)    Spondylosis, cervical, with myelopathy 08/31/2015   C3-4 myelopathy   Temporal arteritis (HCC)    Right eye blind, on steroids per Neruro/ Dr Anne Hahn   Type II or unspecified type diabetes mellitus without mention of complication, not stated as uncontrolled    2nd to steriods   Vitamin D deficiency    Past Surgical History:  Procedure Laterality Date   APPENDECTOMY  01/2021   CARPAL TUNNEL RELEASE Right    CATARACT EXTRACTION     OS - Summer 2010   CERVICAL FUSION  09/24/2008   4 rods and pins in place   CHOLECYSTECTOMY     COLONOSCOPY  12/15/2011   Procedure: COLONOSCOPY;  Surgeon: Charna Elizabeth, MD;  Location: [facility];  Service: Endoscopy;  Laterality: N/A;   LUMBAR FUSION  04/24/2010   W/Mechanical fixation - L2-5 [facility]   LUMBAR FUSION  09/25/2011   rods in hips (to stabilize).   MASTECTOMY     NECK SURGERY     rheumatoid nodule removal     SPINAL FUSION  07/25/2010   T10-L2 interbody fusion /  [facility]   TONSILLECTOMY     TOTAL KNEE ARTHROPLASTY     Left    reports that she has never smoked. She has never used smokeless tobacco. She reports that she does not currently use alcohol. She reports that she does not use drugs. family history includes Arthritis in her father and mother; Brain cancer in her mother; Breast cancer in her sister; Dementia in her father; Hypertension in her father and mother; Lung cancer in her sister. Allergies  Allergen Reactions   Atorvastatin Nausea Only and Other (See Comments)    Dizzyness.    Hydromorphone Other (See Comments)    Cognitive changes  "made me unconscious" Dilaudid    Review of Systems  Constitutional:  Negative for malaise/fatigue.  Eyes:  Negative for  blurred vision.  Respiratory:  Negative for shortness of breath.   Cardiovascular:  Negative for chest pain.  Gastrointestinal:  Positive for constipation. Negative for abdominal pain.  Musculoskeletal:  Positive for back pain.  Neurological:  Negative for dizziness, weakness and headaches.      Objective:     BP 128/70 (BP Location: Right Arm, Patient Position: Sitting, Cuff Size: Normal)   Pulse 77   Temp 98.2 F (36.8 C) (Oral)   Ht 5\' 1"  (1.549 m)   Wt 129 lb 1.6 oz (58.6 kg)   SpO2 95%   BMI 24.39 kg/m  {Vitals History (Optional):23777}  Physical Exam Vitals reviewed.  Constitutional:      Appearance: She is well-developed.  Eyes:     Pupils: Pupils are equal, round, and reactive to light.  Neck:     Thyroid: No thyromegaly.     Vascular: No JVD.  Cardiovascular:     Rate and Rhythm: Normal rate and regular rhythm.     Heart sounds:     No gallop.  Pulmonary:     Effort: Pulmonary effort is normal. No respiratory distress.     Breath sounds: Normal breath sounds. No wheezing or rales.  Musculoskeletal:     Cervical back: Neck supple.     Right lower leg: No edema.     Left lower leg: No edema.  Neurological:     Mental Status: She is alert.      No results found for any visits on 03/08/23.  {Labs (Optional):23779}  The ASCVD Risk score (Arnett DK, et al., 2019) failed to calculate for the following reasons:   The 2019 ASCVD risk score is only valid for ages 44 to 26    Assessment & Plan:   #1 chronic back pain with multiple prior surgeries.  Controlled on OxyContin 10 mg twice daily.  Patient had refill for 3 months May 6.  She is having some associated constipation issues and see below.  Will need refills by late July.  Her last drug screen was 11/23.  No history of misuse.  #2 constipation probably exacerbated by chronic opioid use.  Increase fluids.  Aim for at least 25 g fiber per day.  Continue stool softener.  Add MiraLAX 17 g once daily as  needed  #3 history of statin therapy.  This apparently started because of her questionable TIA.  Possible side effects from atorvastatin.  We discussed other possible statin such as rosuvastatin but at this point she declines.  #4 hypertension stable and at goal.  Continue amlodipine 5 mg daily.  Continue low-sodium diet.  Evelena Peat, MD

## 2023-03-08 NOTE — Patient Instructions (Signed)
Add Miralax if needed for constipation.

## 2023-03-19 ENCOUNTER — Other Ambulatory Visit: Payer: Self-pay | Admitting: Family Medicine

## 2023-03-19 NOTE — Telephone Encounter (Signed)
Prescription Request  03/19/2023  LOV: 03/08/2023  What is the name of the medication or equipment? oxyCODONE (OXYCONTIN) 10 mg 12 hr tablet oxyCODONE (OXYCONTIN) 10 mg 12 hr tablet oxyCODONE (OXYCONTIN) 10 mg 12 hr tablet  Have you contacted your pharmacy to request a refill? Yes   Which pharmacy would you like this sent to?   [facility] - [place], [place] - [address] [place] [address] Phone: [phone] Fax: [phone]    Patient notified that their request is being sent to the clinical staff for review and that they should receive a response within 2 business days.   Please advise at Mobile [phone] (mobile)

## 2023-03-20 NOTE — Telephone Encounter (Signed)
Patient informed of the message below and stated she got the months of June and July mixed up in error.

## 2023-03-27 ENCOUNTER — Encounter: Payer: Self-pay | Admitting: Family Medicine

## 2023-03-27 ENCOUNTER — Ambulatory Visit (INDEPENDENT_AMBULATORY_CARE_PROVIDER_SITE_OTHER): Payer: Medicare Other | Admitting: Family Medicine

## 2023-03-27 VITALS — BP 134/60 | HR 75 | Temp 98.1°F | Ht 61.0 in | Wt 123.3 lb

## 2023-03-27 DIAGNOSIS — M545 Low back pain, unspecified: Secondary | ICD-10-CM | POA: Diagnosis not present

## 2023-03-27 DIAGNOSIS — G8929 Other chronic pain: Secondary | ICD-10-CM | POA: Diagnosis not present

## 2023-03-27 NOTE — Progress Notes (Unsigned)
   Established Patient Office Visit  Subjective   Patient ID: [name], female    DOB: 07/28/1941  Age: 82 y.o. MRN: [id number]  Chief Complaint  Patient presents with   Pain Management    HPI  {History (Optional):23778} [name] is here apparently for pain management follow-up.  She was here just couple weeks ago but apparently already had this appointment scheduled.  As per prior note she took herself off of atorvastatin thinking she "did not need this ".  We explained that there is question of TIA from prior admission and that was why this was initiated.  She is considering going back on this.  Complains of dryness around her lips.  She has tried Vaseline which seems to be helping somewhat.  Tried various lip balm's without much improvement.  Indication for chronic opioid: Chronic neck and back pain Medication and dose: OxyContin 10 mg twice daily # pills per month: 60 Last UDS date: 11/23 Opioid Treatment Agreement signed (Y/N): yes Opioid Treatment Agreement last reviewed with patient:   NCCSRS reviewed this encounter (include red flags): None   Review of Systems  Constitutional:  Negative for malaise/fatigue.  Eyes:  Negative for blurred vision.  Respiratory:  Negative for shortness of breath.   Cardiovascular:  Negative for chest pain.  Neurological:  Negative for dizziness, weakness and headaches.      Objective:     BP 134/60 (BP Location: Left Arm, Patient Position: Sitting, Cuff Size: Normal)   Pulse 75   Temp 98.1 F (36.7 C) (Oral)   Ht 5\' 1"  (1.549 m)   Wt 123 lb 4.8 oz (55.9 kg)   SpO2 95%   BMI 23.30 kg/m  {Vitals History (Optional):23777}  Physical Exam Vitals reviewed.  Constitutional:      Appearance: She is well-developed.  Eyes:     Pupils: Pupils are equal, round, and reactive to light.  Neck:     Thyroid: No thyromegaly.     Vascular: No JVD.  Cardiovascular:     Rate and Rhythm: Normal rate and regular rhythm.     Heart  sounds:     No gallop.  Pulmonary:     Effort: Pulmonary effort is normal. No respiratory distress.     Breath sounds: Normal breath sounds. No wheezing or rales.  Musculoskeletal:     Cervical back: Neck supple.     Right lower leg: No edema.     Left lower leg: No edema.  Neurological:     Mental Status: She is alert.      No results found for any visits on 03/27/23.  {Labs (Optional):23779}  The ASCVD Risk score (Arnett DK, et al., 2019) failed to calculate for the following reasons:   The 2019 ASCVD risk score is only valid for ages 58 to 59    Assessment & Plan:   Chronic pain management.  Patient stable on OxyContin 10 mg twice daily.  She is on this regimen for several years.  Last refill was for 3 months on May 6.  She will need to have this sent in around the middle of July because of her mail order.  Will need drug screen by next fall.  Set up 42-month follow-up  Evelena Peat, MD

## 2023-03-27 NOTE — Patient Instructions (Signed)
Consider getting back on the Atovastatin  Let's plan on 3 month follow up.

## 2023-03-28 MED ORDER — OXYCODONE HCL ER 10 MG PO T12A: 10.0000 mg | EXTENDED_RELEASE_TABLET | Freq: Two times a day (BID) | ORAL | 0 refills | Status: DC

## 2023-04-17 DIAGNOSIS — M0589 Other rheumatoid arthritis with rheumatoid factor of multiple sites: Secondary | ICD-10-CM | POA: Diagnosis not present

## 2023-04-25 DIAGNOSIS — S61210A Laceration without foreign body of right index finger without damage to nail, initial encounter: Secondary | ICD-10-CM | POA: Diagnosis not present

## 2023-04-25 DIAGNOSIS — S60021A Contusion of right index finger without damage to nail, initial encounter: Secondary | ICD-10-CM | POA: Diagnosis not present

## 2023-05-05 DIAGNOSIS — S61210D Laceration without foreign body of right index finger without damage to nail, subsequent encounter: Secondary | ICD-10-CM | POA: Diagnosis not present

## 2023-05-13 ENCOUNTER — Ambulatory Visit (INDEPENDENT_AMBULATORY_CARE_PROVIDER_SITE_OTHER): Payer: Medicare Other | Admitting: Podiatry

## 2023-05-13 ENCOUNTER — Encounter: Payer: Self-pay | Admitting: Podiatry

## 2023-05-13 DIAGNOSIS — B351 Tinea unguium: Secondary | ICD-10-CM | POA: Insufficient documentation

## 2023-05-13 DIAGNOSIS — G63 Polyneuropathy in diseases classified elsewhere: Secondary | ICD-10-CM

## 2023-05-13 DIAGNOSIS — M79674 Pain in right toe(s): Secondary | ICD-10-CM

## 2023-05-13 DIAGNOSIS — M79675 Pain in left toe(s): Secondary | ICD-10-CM

## 2023-05-13 DIAGNOSIS — M898X7 Other specified disorders of bone, ankle and foot: Secondary | ICD-10-CM

## 2023-05-13 NOTE — Progress Notes (Signed)
This patient presents to the office with chief complaint of long thick painful nails.  Patient says the nails are painful walking and wearing shoes.  This patient is unable to self treat.  This patient is unable to trim her nails since she is unable to reach her nails.  She presents to the office for preventative foot care services.  General Appearance  Alert, conversant and in no acute stress.  Vascular  Dorsalis pedis and posterior tibial  pulses are  weakly palpable  bilaterally.  Capillary return is within normal limits  bilaterally. Temperature is within normal limits  bilaterally.  Neurologic  Senn-Weinstein monofilament wire test diminished   bilaterally. Muscle power within normal limits bilaterally.  Nails Thick disfigured discolored nails with subungual debris  from hallux to fifth toes bilaterally. No evidence of bacterial infection or drainage bilaterally.  Orthopedic  No limitations of motion  feet .  No crepitus or effusions noted.  No bony pathology or digital deformities noted. HAV  B/L.  Exostosis sub 1st MCJ left foot.  Skin  normotropic skin with no porokeratosis noted bilaterally.  No signs of infections or ulcers noted.     Onychomycosis  Nails  B/L.  Pain in right toes  Pain in left toes  Debridement of nails both feet followed trimming the nails with dremel tool.    RTC 3 months.   Helane Gunther DPM

## 2023-05-24 ENCOUNTER — Encounter (HOSPITAL_BASED_OUTPATIENT_CLINIC_OR_DEPARTMENT_OTHER): Payer: Self-pay

## 2023-05-24 ENCOUNTER — Emergency Department (HOSPITAL_BASED_OUTPATIENT_CLINIC_OR_DEPARTMENT_OTHER)
Admission: EM | Admit: 2023-05-24 | Discharge: 2023-05-25 | Disposition: A | Discharge status: Home / Self Care | Payer: Medicare Other | Source: Home / Self Care | Attending: Emergency Medicine | Admitting: Emergency Medicine

## 2023-05-24 ENCOUNTER — Other Ambulatory Visit: Payer: Self-pay

## 2023-05-24 ENCOUNTER — Emergency Department (HOSPITAL_BASED_OUTPATIENT_CLINIC_OR_DEPARTMENT_OTHER): Payer: Medicare Other | Admitting: Radiology

## 2023-05-24 DIAGNOSIS — Z7982 Long term (current) use of aspirin: Secondary | ICD-10-CM | POA: Diagnosis not present

## 2023-05-24 DIAGNOSIS — M62838 Other muscle spasm: Secondary | ICD-10-CM | POA: Diagnosis not present

## 2023-05-24 DIAGNOSIS — M19011 Primary osteoarthritis, right shoulder: Secondary | ICD-10-CM | POA: Diagnosis not present

## 2023-05-24 DIAGNOSIS — I10 Essential (primary) hypertension: Secondary | ICD-10-CM | POA: Insufficient documentation

## 2023-05-24 DIAGNOSIS — E1142 Type 2 diabetes mellitus with diabetic polyneuropathy: Secondary | ICD-10-CM | POA: Insufficient documentation

## 2023-05-24 DIAGNOSIS — M25511 Pain in right shoulder: Secondary | ICD-10-CM | POA: Diagnosis not present

## 2023-05-24 MED ORDER — ACETAMINOPHEN 325 MG PO TABS
650.0000 mg | ORAL_TABLET | Freq: Once | ORAL | Status: AC
  Administered 2023-05-25: 650 mg via ORAL
  Filled 2023-05-24: qty 2

## 2023-05-24 NOTE — ED Provider Notes (Signed)
[place] EMERGENCY DEPARTMENT AT [facility] Provider Note   CSN: [id number] Arrival date & time: 05/24/23  2120     History {Add pertinent medical, surgical, social history, OB history to HPI:1} Chief Complaint  Patient presents with   Shoulder Pain    right    [name] is a 82 y.o. female.  HPI     This is an 82 year old female who presents with right shoulder and neck pain.  Patient reports onset of symptoms around 3 hours ago.  She states she was bending down to get something when she had sharp pain in her right shoulder and posterior back.  At that time she had some numbness that went down her right arm.  She states the pain comes and goes.  It is worse with certain positions and movements.  Denies shortness of breath or chest pain.  Denies weakness of the arm.  Patient denies any injury or significant recent heavy lifting.  Home Medications Prior to Admission medications   Medication Sig Start Date End Date Taking? Authorizing Provider  amLODipine (NORVASC) 5 MG tablet TAKE 1 TABLET DAILY 10/22/22   Burchette, Elberta Fortis, MD  aspirin EC 81 MG tablet Take 1 tablet (81 mg total) by mouth daily. Swallow whole. 08/03/22 08/03/23  Osvaldo Shipper, MD  atorvastatin (LIPITOR) 20 MG tablet Take 20 mg by mouth daily.    [provider]  Calcium Carb-Cholecalciferol (CALCIUM CARBONATE-VITAMIN D3 PO) Take 1 tablet by mouth daily    [provider]  cholecalciferol (VITAMIN D) 1000 UNITS tablet Take 1,000 Units by mouth daily.    [provider]  denosumab (PROLIA) 60 MG/ML SOLN injection Inject 60 mg into the skin every 6 (six) months.    [provider]  docusate sodium (COLACE) 100 MG capsule Take 100 mg by mouth 2 (two) times daily.    [provider]  furosemide (LASIX) 40 MG tablet TAKE 1 TABLET DAILY 09/14/22   Burchette, Elberta Fortis, MD  gabapentin (NEURONTIN) 300 MG capsule TAKE 1 CAPSULE TWICE A DAY 12/06/22   Burchette,  Elberta Fortis, MD  Golimumab (SIMPONI ARIA IV) Inject into the vein. Takes infusion every 8 wks.    [provider]  Multiple Vitamins-Minerals (MULTIVITAMIN,TX-MINERALS) tablet Take 1 tablet by mouth daily.    [provider]  Omega-3 Fatty Acids (FISH OIL OMEGA-3 PO) Take 1 capsule by mouth daily.    [provider]  oxyCODONE (OXYCONTIN) 10 mg 12 hr tablet Take 1 tablet (10 mg total) by mouth every 12 (twelve) hours. May refill 29 days after prior prescription 11/02/22   Kristian Covey, MD  oxyCODONE (OXYCONTIN) 10 mg 12 hr tablet Take 1 tablet (10 mg total) by mouth every 12 (twelve) hours. 12/25/22   Eulis Foster, FNP  oxyCODONE (OXYCONTIN) 10 mg 12 hr tablet Take 1 tablet (10 mg total) by mouth every 12 (twelve) hours. 03/28/23   Burchette, Elberta Fortis, MD  potassium chloride (KLOR-CON M) 10 MEQ tablet TAKE 1 TABLET TWICE A DAY 02/22/23   Burchette, Elberta Fortis, MD  predniSONE (DELTASONE) 1 MG tablet TAKE 4 TABLETS DAILY WITH BREAKFAST 12/20/22   Glean Salvo, NP  vitamin B-12 (CYANOCOBALAMIN) 100 MCG tablet Take 100 mcg by mouth daily.    [provider]      Allergies    Atorvastatin and Hydromorphone    Review of Systems   Review of Systems  Constitutional:  Negative for fever.  Respiratory:  Negative for  shortness of breath.   Cardiovascular:  Negative for chest pain.  Gastrointestinal:  Negative for abdominal pain.  Musculoskeletal:        Shoulder pain  All other systems reviewed and are negative.   Physical Exam Updated Vital Signs BP (!) 169/93   Pulse 72   Temp 98.3 F (36.8 C) (Oral)   Resp 12   Ht 1.549 m (5\' 1" )   Wt 55.8 kg   SpO2 100%   BMI 23.24 kg/m  Physical Exam Vitals and nursing note reviewed.  Constitutional:      Appearance: She is well-developed. She is not ill-appearing.  HENT:     Head: Normocephalic and atraumatic.  Eyes:     Pupils: Pupils are equal, round, and reactive to light.  Cardiovascular:     Rate and  Rhythm: Normal rate and regular rhythm.     Heart sounds: Normal heart sounds.  Pulmonary:     Effort: Pulmonary effort is normal. No respiratory distress.     Breath sounds: No wheezing.  Abdominal:     Palpations: Abdomen is soft.     Tenderness: There is no abdominal tenderness.  Musculoskeletal:     Cervical back: Neck supple.     Comments: Tenderness to palpation over the right trapezius just lateral to the Upper thoracic spine, muscle spasm noted and Ativan.  Normal range of motion of the shoulder, 2+ radial pulse  Skin:    General: Skin is warm and dry.  Neurological:     Mental Status: She is alert and oriented to person, place, and time.     Comments: 5 out of 5 strength in all 4 extremities  Psychiatric:        Mood and Affect: Mood normal.     ED Results / Procedures / Treatments   Labs (all labs ordered are listed, but only abnormal results are displayed) Labs Reviewed - No data to display  EKG None  Radiology DG Shoulder Right  Result Date: 05/24/2023 CLINICAL DATA:  Right shoulder pain EXAM: RIGHT SHOULDER - 2+ VIEW COMPARISON:  None Available. FINDINGS: Advanced degenerative changes in the right [facility] joint with subacromial and subclavicular spurring. Glenohumeral joint is intact. No acute bony abnormality. Specifically, no fracture, subluxation, or dislocation. IMPRESSION: Advanced degenerative changes in the [facility] joint. No acute bony abnormality. Electronically Signed   By: Charlett Nose M.D.   On: 05/24/2023 22:33    Procedures Procedures  {Document cardiac monitor, telemetry assessment procedure when appropriate:1}  Medications Ordered in ED Medications  acetaminophen (TYLENOL) tablet 650 mg (has no administration in time range)    ED Course/ Medical Decision Making/ A&P   {   Click here for ABCD2, HEART and other calculatorsREFRESH Note before signing :1}                              Medical Decision Making Amount and/or Complexity of Data  Reviewed Radiology: ordered.  Risk OTC drugs.   ***  {Document critical care time when appropriate:1} {Document review of labs and clinical decision tools ie heart score, Chads2Vasc2 etc:1}  {Document your independent review of radiology images, and any outside records:1} {Document your discussion with family members, caretakers, and with consultants:1} {Document social determinants of health affecting pt's care:1} {Document your decision making why or why not admission, treatments were needed:1} Final Clinical Impression(s) / ED Diagnoses Final diagnoses:  None    Rx / DC Orders ED Discharge  Orders     None

## 2023-05-24 NOTE — ED Triage Notes (Signed)
Pt to ED c/o right shoulder pain x 3 hours, intermittent in nature, reports when moves certain ways it hurts. Reports pain started when she bent down to put on shoes. Denies SHOB and chest pain. A&O X 4 in triage.

## 2023-05-25 ENCOUNTER — Other Ambulatory Visit: Payer: Self-pay

## 2023-05-25 NOTE — Discharge Instructions (Signed)
You were seen today for pain in the right trapezius and shoulder.  Your symptoms are most consistent with a musculoskeletal etiology.  Continue your pain medication at home.  You may ultimately benefit from physical therapy.

## 2023-06-07 ENCOUNTER — Telehealth: Payer: Self-pay

## 2023-06-07 ENCOUNTER — Encounter: Payer: Self-pay | Admitting: Family Medicine

## 2023-06-07 ENCOUNTER — Ambulatory Visit (INDEPENDENT_AMBULATORY_CARE_PROVIDER_SITE_OTHER): Payer: Medicare Other | Admitting: Family Medicine

## 2023-06-07 VITALS — BP 138/80 | HR 80 | Temp 98.1°F | Ht 61.0 in | Wt 122.7 lb

## 2023-06-07 DIAGNOSIS — M549 Dorsalgia, unspecified: Secondary | ICD-10-CM

## 2023-06-07 DIAGNOSIS — M25511 Pain in right shoulder: Secondary | ICD-10-CM | POA: Diagnosis not present

## 2023-06-07 DIAGNOSIS — M791 Myalgia, unspecified site: Secondary | ICD-10-CM

## 2023-06-07 MED ORDER — METHYLPREDNISOLONE ACETATE 40 MG/ML IJ SUSP
40.0000 mg | Freq: Once | INTRAMUSCULAR | Status: AC
Start: 2023-06-07 — End: 2023-06-07
  Administered 2023-06-07: 40 mg via INTRA_ARTICULAR

## 2023-06-07 NOTE — Telephone Encounter (Signed)
Transition Care Management Unsuccessful Follow-up Telephone Call  Date of discharge and from where:  05/25/2023 [facility]  Attempts:  1st Attempt  Reason for unsuccessful TCM follow-up call:  Left voice message  [name] [facility]  [facility], [facility] Guide Direct Dial: [phone]  Website: [email or web address]

## 2023-06-07 NOTE — Progress Notes (Signed)
Established Patient Office Visit  Subjective   Patient ID: [name], female    DOB: Feb 19, 1941  Age: 82 y.o. MRN: [id number]  Chief Complaint  Patient presents with   Shoulder Pain    Patient complains of right shoulder pain, x2 weeks, Tried Tylenol    HPI   Salli is seen with right upper back pain right upper thoracic area.  She states she has 94 year old sister who lives in [place] who has tremendous needs and she has been assisting her for the past 6 months almost every weekend.  Her sister frequently requires help with getting up and she has been trying to help lift her in spite of [place]'s chronic neck and back difficulties.  She also relates that she had a fall around 1 August getting out of the shower and landed on her right hip.  Ambulating okay.  She also went to the ER on 05/24/2023 and x-rays right shoulder showed some degenerative changes but no acute abnormality.  She denies any neck pain.  No chest pain.  No dyspnea, pleuritic pain, fever, or cough.  She takes OxyContin 10 mg twice daily at baseline.  She applied lidocaine 4% patch 1 night without much improvement.  Denies any cervical radiculitis symptoms.  Denies any upper extremity weakness.  She was not prescribed any medications with recent ER visit.  Obviously, because of her age she is high risk for things like muscle relaxers and non-steroidals  Past Medical History:  Diagnosis Date   Allergic rhinitis    Anemia    Anxiety    Bronchiectasis    Carpal tunnel syndrome 07/13/2015   Bilateral   CVA (cerebral infarction) 10/2007   Right thalamic    Diverticulosis of colon    Gait disorder    GERD (gastroesophageal reflux disease)    History of breast cancer    HTN (hypertension)    Hyperlipidemia    Idiopathic pulmonary fibrosis    Left knee DJD    Migraine    Osteoporosis    Peripheral neuropathy    PMR (polymyalgia rheumatica) (HCC)    Polyneuropathy in other diseases classified  elsewhere (HCC) 02/17/2013   Previous back surgery 10/09/12   Renal insufficiency    Rheumatoid arthritis (HCC)    Seizure disorder (HCC)    Spondylosis, cervical, with myelopathy 08/31/2015   C3-4 myelopathy   Temporal arteritis (HCC)    Right eye blind, on steroids per [facility]er[facility]/ Dr Anne Hahn   Type II or unspecified type diabetes mellitus without mention of complication, not stated as uncontrolled    2nd to steriods   Vitamin D deficiency    Past Surgical History:  Procedure Laterality Date   APPENDECTOMY  01/2021   CARPAL TUNNEL RELEASE Right    CATARACT EXTRACTION     OS - Summer 2010   CERVICAL FUSION  09/24/2008   4 rods and pins in place   CHOLECYSTECTOMY     COLONOSCOPY  12/15/2011   Procedure: COLONOSCOPY;  Surgeon: Charna Elizabeth, MD;  Location: [facility];  Service: Endoscopy;  Laterality: N/A;   LUMBAR FUSION  04/24/2010   W/Mechanical fixation - L2-5 [facility]   LUMBAR FUSION  09/25/2011   rods in hips (to stabilize).   MASTECTOMY     NECK SURGERY     rheumatoid nodule removal     SPINAL FUSION  07/25/2010   T10-L2 interbody fusion / [facility]   TONSILLECTOMY     TOTAL KNEE ARTHROPLASTY  Left    reports that she has never smoked. She has never used smokeless tobacco. She reports that she does not currently use alcohol. She reports that she does not use drugs. family history includes Arthritis in her father and mother; Brain cancer in her mother; Breast cancer in her sister; Dementia in her father; Hypertension in her father and mother; Lung cancer in her sister. Allergies  Allergen Reactions   Atorvastatin Nausea Only and Other (See Comments)    Dizzyness.    Hydromorphone Other (See Comments)    Cognitive changes  "made me unconscious" Dilaudid    Review of Systems  Constitutional:  Negative for chills, fever and weight loss.  Respiratory:  Negative for cough, hemoptysis, shortness of breath and wheezing.   Cardiovascular:  Negative for chest  pain and leg swelling.  Musculoskeletal:  Negative for neck pain.  Skin:  Negative for rash.      Objective:     BP 138/80 (BP Location: Right Arm, Cuff Size: Normal)   Pulse 80   Temp 98.1 F (36.7 C) (Oral)   Ht 5\' 1"  (1.549 m)   Wt 122 lb 11.2 oz (55.7 kg)   SpO2 98%   BMI 23.18 kg/m  BP Readings from Last 3 Encounters:  06/07/23 138/80  05/25/23 (!) 157/93  03/27/23 134/60   Wt Readings from Last 3 Encounters:  06/07/23 122 lb 11.2 oz (55.7 kg)  05/24/23 123 lb (55.8 kg)  03/27/23 123 lb 4.8 oz (55.9 kg)      Physical Exam Vitals reviewed.  Constitutional:      Appearance: Normal appearance.  Cardiovascular:     Rate and Rhythm: Normal rate and regular rhythm.  Pulmonary:     Effort: Pulmonary effort is normal.     Breath sounds: Normal breath sounds.  Musculoskeletal:     Comments: She has localized area of tenderness about 2 x 2 cm right upper thoracic area few centimeters lateral to the spine in the trapezius region.  Skin:    Findings: No rash.  Neurological:     Mental Status: She is alert.      No results found for any visits on 06/07/23.  Last CBC Lab Results  Component Value Date   WBC 8.3 08/02/2022   HGB 12.9 08/02/2022   HCT 35.6 (L) 08/02/2022   MCV 99.4 08/02/2022   MCH 36.0 (H) 08/02/2022   RDW 13.2 08/02/2022   PLT 258 08/02/2022   Last metabolic panel Lab Results  Component Value Date   GLUCOSE 103 (H) 08/02/2022   NA 141 08/02/2022   K 3.7 08/02/2022   CL 103 08/02/2022   CO2 26 08/02/2022   BUN 15 08/02/2022   CREATININE 1.29 (H) 08/02/2022   GFRNONAA 41 (L) 08/02/2022   CALCIUM 8.1 (L) 08/02/2022   PHOS 2.0 (L) 08/02/2022   PROT 7.0 08/02/2022   ALBUMIN 3.4 (L) 08/02/2022   BILITOT 0.9 08/02/2022   ALKPHOS 66 08/02/2022   AST 21 08/02/2022   ALT 14 08/02/2022   ANIONGAP 12 08/02/2022      The ASCVD Risk score (Arnett DK, et al., 2019) failed to calculate for the following reasons:   The 2019 ASCVD risk score  is only valid for ages 74 to 12    Assessment & Plan:   Patient presents with several week history of right upper back pain in the right trapezius region.  Denies specific injury but has been helping to lift her debilitated sister for several months.  She has very localized tenderness over trigger point right upper back.  No spinal tenderness.  She has tried Tylenol and lidocaine 4% patches without much relief.  -Recent right shoulder x-rays reviewed with no acute changes. -Avoid nonsteroidals and muscle relaxers with her age and increased risk -We did discuss possible trigger point injection.  Discussed low risk of infection and bleeding.  Patient consented.  Localized area right trapezius most tender and prepped skin with alcohol.  Injected 40 mg of Depo-Medrol and 1 cc of plain Xylocaine.  Patient tolerated well. -If not improving by next week consider trial of physical therapy -We have advised her to try to avoid any heavy lifting or straining with her right upper extremity for at least the next couple weeks   No follow-ups on file.    Evelena Peat, MD

## 2023-06-07 NOTE — Patient Instructions (Signed)
Be in touch if not improving by next week  We might want to consider trial of physical therapy if not improved any by next week.

## 2023-06-10 ENCOUNTER — Telehealth: Payer: Self-pay

## 2023-06-10 ENCOUNTER — Telehealth: Payer: Self-pay | Admitting: Family Medicine

## 2023-06-10 ENCOUNTER — Encounter: Payer: Self-pay | Admitting: Family Medicine

## 2023-06-10 ENCOUNTER — Ambulatory Visit (INDEPENDENT_AMBULATORY_CARE_PROVIDER_SITE_OTHER): Payer: Medicare Other | Admitting: Family Medicine

## 2023-06-10 VITALS — BP 144/84 | HR 62 | Temp 97.7°F | Ht 61.0 in

## 2023-06-10 DIAGNOSIS — R202 Paresthesia of skin: Secondary | ICD-10-CM

## 2023-06-10 DIAGNOSIS — M542 Cervicalgia: Secondary | ICD-10-CM

## 2023-06-10 MED ORDER — OXYCODONE HCL 10 MG PO TABS: 10.0000 mg | ORAL_TABLET | Freq: Four times a day (QID) | ORAL | 0 refills | Status: DC | PRN

## 2023-06-10 NOTE — Telephone Encounter (Signed)
Pt is calling checking on the status of the medication

## 2023-06-10 NOTE — Patient Instructions (Addendum)
We will try to set up with Dr Wyline Mood.    Will send in some Oxycodone 10 mg to take for breakthrough pain.

## 2023-06-10 NOTE — Telephone Encounter (Signed)
Transition Care Management Follow-up Telephone Call Date of discharge and from where: 05/25/2023 [facility] How have you been since you were released from the hospital? Patient stated she is not feeling well at all. She is in a lot of pain. Any questions or concerns? No  Items Reviewed: Did the pt receive and understand the discharge instructions provided? Yes  Medications obtained and verified?  No medication prescribed. Other? No  Any new allergies since your discharge? No  Dietary orders reviewed? Yes Do you have support at home? Yes   Follow up appointments reviewed:  PCP Hospital f/u appt confirmed? Yes  Scheduled to see Kristian Covey, MD on 06/07/2023 @ [facility]. Specialist Hospital f/u appt confirmed? Yes  Scheduled to see Glean Salvo, NP on 07/04/2023 @ [facility]. Are transportation arrangements needed? No  If their condition worsens, is the pt aware to call PCP or go to the Emergency Dept.? Yes Was the patient provided with contact information for the PCP's office or ED? Yes Was to pt encouraged to call back with questions or concerns? Yes  Daelen Belvedere [facility]  [facility], [facility] Guide Direct Dial: [phone]  Website: [email or web address]

## 2023-06-10 NOTE — Telephone Encounter (Signed)
Pharmacy call and stated pt was prescribe Oxycodone today and was already prescribe Oxycontin pharmacy want to know what is going on and want a call back at [phone] she also stated they close at 6:00.

## 2023-06-10 NOTE — Telephone Encounter (Signed)
I spoke with the pharmacist at [facility] and informed her of the message below.

## 2023-06-10 NOTE — Progress Notes (Signed)
Established Patient Office Visit  Subjective   Patient ID: [name], female    DOB: Jul 18, 1941  Age: 82 y.o. MRN: [id number]  Chief Complaint  Patient presents with   Back Pain   Numbness    Patient complains of right arm numbness    HPI   [name] is seen for follow-up from recent right upper back pain.  Refer to recent note for detail.  She has been doing a lot of help of lifting her 8 year old sister and thinks she may have injured something with that.  She also had a fall around August 1.  She had ER visit August 30 with x-rays of the right shoulder which showed degenerative changes but no acute finding.  She had tried lidocaine 4% patch without improvement.  We localized an area of tenderness right upper back near the trapezius and suspect that she may have some trigger point.  We tried steroid injection but she did not see improvement of the weekend..  We tried steroid injection but she did not see improvement of the weekend.  She is now complaining of some increased right neck pain near the base of the cervical spine and paresthesias involving most of the right upper extremity.  No weakness.  Has had multiple back surgeries previously by Dr. Wyline Mood in [place] including a couple cervical spine surgeries in about 4 lumbar surgeries.    Past Medical History:  Diagnosis Date   Allergic rhinitis    Anemia    Anxiety    Bronchiectasis    Carpal tunnel syndrome 07/13/2015   Bilateral   CVA (cerebral infarction) 10/2007   Right thalamic    Diverticulosis of colon    Gait disorder    GERD (gastroesophageal reflux disease)    History of breast cancer    HTN (hypertension)    Hyperlipidemia    Idiopathic pulmonary fibrosis    Left knee DJD    Migraine    Osteoporosis    Peripheral neuropathy    PMR (polymyalgia rheumatica) (HCC)    Polyneuropathy in other diseases classified elsewhere (HCC) 02/17/2013   Previous back surgery 10/09/12   Renal insufficiency     Rheumatoid arthritis (HCC)    Seizure disorder (HCC)    Spondylosis, cervical, with myelopathy 08/31/2015   C3-4 myelopathy   Temporal arteritis (HCC)    Right eye blind, on steroids per [facility]/ Dr Anne Hahn   Type II or unspecified type diabetes mellitus without mention of complication, not stated as uncontrolled    2nd to steriods   Vitamin D deficiency    Past Surgical History:  Procedure Laterality Date   APPENDECTOMY  01/2021   CARPAL TUNNEL RELEASE Right    CATARACT EXTRACTION     OS - Summer 2010   CERVICAL FUSION  09/24/2008   4 rods and pins in place   CHOLECYSTECTOMY     COLONOSCOPY  12/15/2011   Procedure: COLONOSCOPY;  Surgeon: Charna Elizabeth, MD;  Location: [facility];  Service: Endoscopy;  Laterality: N/A;   LUMBAR FUSION  04/24/2010   W/Mechanical fixation - L2-5 [facility]   LUMBAR FUSION  09/25/2011   rods in hips (to stabilize).   MASTECTOMY     NECK SURGERY     rheumatoid nodule removal     SPINAL FUSION  07/25/2010   T10-L2 interbody fusion / [facility]   TONSILLECTOMY     TOTAL KNEE ARTHROPLASTY     Left    reports that she has never smoked.  She has never used smokeless tobacco. She reports that she does not currently use alcohol. She reports that she does not use drugs. family history includes Arthritis in her father and mother; Brain cancer in her mother; Breast cancer in her sister; Dementia in her father; Hypertension in her father and mother; Lung cancer in her sister. Allergies  Allergen Reactions   Atorvastatin Nausea Only and Other (See Comments)    Dizzyness.    Hydromorphone Other (See Comments)    Cognitive changes  "made me unconscious" Dilaudid    Review of Systems  Constitutional:  Negative for chills and fever.  Musculoskeletal:  Positive for neck pain.  Neurological:  Positive for sensory change. Negative for dizziness, focal weakness and headaches.      Objective:     BP (!) 144/84 (BP Location: Left Arm, Patient  Position: Sitting, Cuff Size: Normal)   Pulse 62   Temp 97.7 F (36.5 C) (Oral)   Ht 5\' 1"  (1.549 m)   SpO2 100%   BMI 23.18 kg/m  BP Readings from Last 3 Encounters:  06/10/23 (!) 144/84  06/07/23 138/80  05/25/23 (!) 157/93   Wt Readings from Last 3 Encounters:  06/07/23 122 lb 11.2 oz (55.7 kg)  05/24/23 123 lb (55.8 kg)  03/27/23 123 lb 4.8 oz (55.9 kg)      Physical Exam Vitals reviewed.  Constitutional:      Appearance: Normal appearance.  Cardiovascular:     Rate and Rhythm: Normal rate and regular rhythm.  Musculoskeletal:     Comments: Cervical spinal tenderness.  Full range of motion right shoulder.  Neurological:     Mental Status: She is alert.     Comments: She has full grip strength bilaterally.  2+ reflexes brachioradialis and biceps bilaterally      No results found for any visits on 06/10/23.    The ASCVD Risk score (Arnett DK, et al., 2019) failed to calculate for the following reasons:   The 2019 ASCVD risk score is only valid for ages 85 to 34    Assessment & Plan:   Problem List Items Addressed This Visit       Unprioritized   Cervicalgia - Primary  Patient seen with recent right upper back pain and now with right-sided cervicalgia (progressive pain) with some paresthesias involving right upper extremity.  Suspect cervical nerve root impingement.  She has had multiple surgeries previously including a couple of prior cervical surgeries.  -Set up referral back to her neurosurgeon.  She has some question whether he may have retired since she has seen him.  She has previously seen Dr. Donette Larry in [place] who is performed all of her previous surgeries -She is having severe pain at times.  She already takes OxyContin 10 mg twice daily.  We gave her prescription for oxycodone 10 mg #21 every 6 hours as needed for breakthrough pain -Follow-up immediately for any progressive weakness, numbness, or progressive pain  No follow-ups on file.     Evelena Peat, MD

## 2023-06-12 NOTE — Addendum Note (Signed)
Addended by: Christy Sartorius on: 06/12/2023 01:18 PM   Modules accepted: Orders

## 2023-06-12 NOTE — Telephone Encounter (Signed)
Pt called back to say she reached out to the office of Dr. Wyline Mood (Neurosurgeon) and was told this doctor has retired.  Pt is asking that MD please create a new referral to see one of the other specialists discussed during last visit?  Also, Pt states she is in a lot of pain and the pain medication is not really helping.

## 2023-06-12 NOTE — Telephone Encounter (Signed)
Left detailed message on patient voicemail informing her that referral was placed and to call back with any questions.

## 2023-06-24 DIAGNOSIS — M0589 Other rheumatoid arthritis with rheumatoid factor of multiple sites: Secondary | ICD-10-CM | POA: Diagnosis not present

## 2023-06-24 DIAGNOSIS — Z79899 Other long term (current) drug therapy: Secondary | ICD-10-CM | POA: Diagnosis not present

## 2023-06-26 ENCOUNTER — Ambulatory Visit: Payer: Medicare Other | Admitting: Family Medicine

## 2023-06-28 DIAGNOSIS — M25511 Pain in right shoulder: Secondary | ICD-10-CM | POA: Diagnosis not present

## 2023-07-02 ENCOUNTER — Ambulatory Visit: Payer: Medicare Other | Admitting: Family Medicine

## 2023-07-04 ENCOUNTER — Encounter: Payer: Self-pay | Admitting: Neurology

## 2023-07-04 ENCOUNTER — Ambulatory Visit: Payer: Medicare Other | Admitting: Neurology

## 2023-07-04 VITALS — BP 153/88 | HR 83 | Ht 61.0 in | Wt 122.0 lb

## 2023-07-04 DIAGNOSIS — R269 Unspecified abnormalities of gait and mobility: Secondary | ICD-10-CM

## 2023-07-04 DIAGNOSIS — G3184 Mild cognitive impairment, so stated: Secondary | ICD-10-CM

## 2023-07-04 DIAGNOSIS — M316 Other giant cell arteritis: Secondary | ICD-10-CM

## 2023-07-04 MED ORDER — PREDNISONE 1 MG PO TABS: ORAL_TABLET | ORAL | 3 refills | Status: DC

## 2023-07-04 NOTE — Progress Notes (Signed)
Chief Complaint  Patient presents with   Follow-up    RM 16 alone  PT is well, denies syncopal episodes. Memory has slightly declined since last visit    ASSESSMENT AND PLAN  [name] is a 82 y.o. female    1.  Right temporal arteritis 2.  Gait disturbance 3.  Peripheral neuropathy 4.  Mild cognitive impairment 5.  Transient loss of consciousness August 02, 2022  -No further syncopal spells, EEG was normal, likely spell was due to infection, decreased oral intake, dehydration.  Of note many years ago reported history of seizure-like spell -Will try to reduce prednisone alternating every other day with 3 mg versus 4 mg, if needed will stay at 4 mg daily, has been on this dose since at least 2012 -MoCA 24/30, overall stable, continues to live independent, but is considering relocating to [place] to be closer to her son -Follow-up in 8 months or sooner if needed  DIAGNOSTIC DATA (LABS, IMAGING, TESTING) - I reviewed patient records, labs, notes, testing and imaging myself where available. CXR on Aug 02 2022 1. No active cardiopulmonary disease. 2. Interstitial prominence predominantly at the lung bases, compatible with known interstitial lung disease. 3. Aortic atherosclerosis.  Lab in Nov 2023: CMP, creat 1.51, CBC, Hg 13.5, TSH, CPK, Troponin, A1C 5.0,   MEDICAL HISTORY:  [name] is a 82 year old female, accompanied by her l longtime friend [name] to follow-up on her recent hospital admission for passing out episode,  I reviewed and summarized the referring note. PMHX. 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 Anne Hahn in the past, temporal arteritis, with polymyalgia rheumatica, with sudden onset of right visual loss, she has been treated with prednisone for a long time, gradually tapering dose, currently taking 4 mg daily, reported relapse of body achy pain if the  dosage tapering down to below 3 mg daily  She also has rheumatoid arthritis, gait abnormality, documented using walker as far back in 2014, bilateral lower extremity neuropathic pain, required variable dose of neuropathic pain medications  At baseline, she lives independently alone at her house, drive, ambulate with walker, her only son is a physician at [employer], Per [name], is in the process of moving patient to a facility to be closer to him  Hospital admission from November 9 to 10, 2023, around 10 AM August 02, 2022, she woke up, went to the bathroom to urinate, was able to wash her hands afterwards, while washing her hands, she felt lightheaded, dizzy," her brain feels so big", she took a quick steps to her chair, slumped into the chair, apparently lost consciousness for about 2 hours, she came around calling her friend [name], was noted to have slurred speech, not herself, [name] urged her to call ambulance, was taken to the hospital  Prior to the incident, she has suffered left lower extremity bleeding, infection due to recent fall accident, she was taking antibiotics, did not have good appetite, was not able to keep up her fluid intake, was seen by wound care, pain medication OxyContin 10 mg twice a day  At the hospital, ultrasound of carotid artery showed no significant stenosis  Echocardiogram showed normal ejection fraction  LDL 85 A1c 5.0, TSH within normal limit, UA showed no evidence of infection, EKG showed no significant abnormality  Personally reviewed MRI of the brain no acute intracranial abnormality, moderate small vessel disease, moderate atrophy  MRA of the brain showed no large vessel disease,  Worried about the TIA, she was given the prescription of Lipitor combination of aspirin and Plavix upon discharge, she has not taking Lipitor and Plavix,  Also reported a history of seizure, few years ago, was found by family on the floor, no antiepileptic  medications  Update December 20, 2022 SS: EEG in December 2023 was normal. Remains on 4 mg prednisone, with 3 mg dose felt bad. No further spells of passing out. Has started drinking boost protein, food with medications. Living at home. Has walker. Getting around okay, 3 falls since November. On aspirin 81 mg daily. Planning to move to retirement homes in [facility], where her son lives, within the next 2 years. Has noted some changes to her short term memory. MOCA 26/30. Looks very well today.   Update July 04, 2023 SS: MOCA 24/30. Still undecided about moving to [facility] to be closer to her son. Lives alone, does everything for herself, drives. Tried alternating prednisone 4 mg/3 mg, went back to 4 mg, felt "strange, anxious". Has noticed needs glasses all the time with writing, going to see eye doctor. With memory, forgets names, remember what she went in room for. No headaches, but stays nervous. Sees Dr. Lovell Sheehan for right shoulder, back pain. Given muscle relaxer, waiting for pharmacy to fill. No more passing out spells. Has lost 10 lbs since last visit. Order PT for mobility.   PHYSICAL EXAM:   Vitals:   07/04/23 1329 07/04/23 1347  BP: (!) 153/86 (!) 153/88  Pulse: 81 83  Weight: 122 lb (55.3 kg)   Height: 5\' 1"  (1.549 m)    Not recorded    Body mass index is 23.05 kg/m.    07/04/2023    1:39 PM 12/20/2022   12:22 PM  Montreal Cognitive Assessment   Visuospatial/ Executive (0/5) 4 4  Naming (0/3) 3 3  Attention: Read list of digits (0/2) 2 2  Attention: Read list of letters (0/1) 1 1  Attention: Serial 7 subtraction starting at 100 (0/3) 2 2  Language: Repeat phrase (0/2) 2 1  Language : Fluency (0/1) 1 1  Abstraction (0/2) 2 2  Delayed Recall (0/5) 1 4  Orientation (0/6) 6 6  Total 24 26  Adjusted Score (based on education)  26   Physical Exam  General: The patient is alert and cooperative at the time of the examination.  Well-appearing, very nicely dressed.  Skin: No significant  peripheral edema is noted.  Neurologic Exam  Mental status: The patient is alert and oriented x 3 at the time of the examination. The patient has apparent normal recent and remote memory, with an apparently normal attention span and concentration ability.  Cranial nerves: Facial symmetry is present. Speech is normal, no aphasia or dysarthria is noted. Extraocular movements are full.  Has vision loss to right eye from temporal arteritis.   Motor: Overall good strength, 4/5 bilateral hip flexion.  Significant deformity of bilateral hand joints  Sensory examination: Soft touch sensation is symmetric on the face, arms, and legs.  Coordination: The patient has good finger-nose-finger and heel-to-shin bilaterally.  Gait and station: Has to push off from seated position to stand, gait is wide-based, unsteady, uses rolling walker in the hallway  Reflexes: Deep tendon reflexes are symmetric.  REVIEW OF SYSTEMS:  Full 14 system review of systems performed and notable only for as above All other review of systems were negative.   ALLERGIES: Allergies  Allergen Reactions  Atorvastatin Nausea Only and Other (See Comments)    Dizzyness.    Hydromorphone Other (See Comments)    Cognitive changes  "made me unconscious" Dilaudid    HOME MEDICATIONS: Current Outpatient Medications  Medication Sig Dispense Refill   amLODipine (NORVASC) 5 MG tablet TAKE 1 TABLET DAILY 90 tablet 3   aspirin EC 81 MG tablet Take 1 tablet (81 mg total) by mouth daily. Swallow whole. 30 tablet 11   Calcium Carb-Cholecalciferol (CALCIUM CARBONATE-VITAMIN D3 PO) Take 1 tablet by mouth daily     cholecalciferol (VITAMIN D) 1000 UNITS tablet Take 1,000 Units by mouth daily.     denosumab (PROLIA) 60 MG/ML SOLN injection Inject 60 mg into the skin every 6 (six) months.     docusate sodium (COLACE) 100 MG capsule Take 100 mg by mouth 2 (two) times daily.     furosemide (LASIX) 40 MG tablet TAKE 1 TABLET DAILY 90 tablet  3   gabapentin (NEURONTIN) 300 MG capsule TAKE 1 CAPSULE TWICE A DAY 360 capsule 3   Golimumab (SIMPONI ARIA IV) Inject into the vein. Takes infusion every 8 wks.     Multiple Vitamins-Minerals (MULTIVITAMIN,TX-MINERALS) tablet Take 1 tablet by mouth daily.     Omega-3 Fatty Acids (FISH OIL OMEGA-3 PO) Take 1 capsule by mouth daily.     oxyCODONE (OXYCONTIN) 10 mg 12 hr tablet Take 1 tablet (10 mg total) by mouth every 12 (twelve) hours. 180 tablet 0   Oxycodone HCl 10 MG TABS Take 1 tablet (10 mg total) by mouth every 6 (six) hours as needed. Take 1 tablet by mouth every 6 hours as needed for breakthrough pain 20 tablet 0   potassium chloride (KLOR-CON M) 10 MEQ tablet TAKE 1 TABLET TWICE A DAY 180 tablet 1   predniSONE (DELTASONE) 1 MG tablet TAKE 4 TABLETS DAILY WITH BREAKFAST 360 tablet 3   vitamin B-12 (CYANOCOBALAMIN) 100 MCG tablet Take 100 mcg by mouth daily.     No current facility-administered medications for this visit.    PAST MEDICAL HISTORY: Past Medical History:  Diagnosis Date   Allergic rhinitis    Anemia    Anxiety    Bronchiectasis    Carpal tunnel syndrome 07/13/2015   Bilateral   CVA (cerebral infarction) 10/2007   Right thalamic    Diverticulosis of colon    Gait disorder    GERD (gastroesophageal reflux disease)    History of breast cancer    HTN (hypertension)    Hyperlipidemia    Idiopathic pulmonary fibrosis    Left knee DJD    Migraine    Osteoporosis    Peripheral neuropathy    PMR (polymyalgia rheumatica) (HCC)    Polyneuropathy in other diseases classified elsewhere (HCC) 02/17/2013   Previous back surgery 10/09/12   Renal insufficiency    Rheumatoid arthritis (HCC)    Seizure disorder (HCC)    Spondylosis, cervical, with myelopathy 08/31/2015   C3-4 myelopathy   Temporal arteritis (HCC)    Right eye blind, on steroids per [facility]/ Dr Anne Hahn   Type II or unspecified type diabetes mellitus without mention of complication, not stated as  uncontrolled    2nd to steriods   Vitamin D deficiency     PAST SURGICAL HISTORY: Past Surgical History:  Procedure Laterality Date   APPENDECTOMY  01/2021   CARPAL TUNNEL RELEASE Right    CATARACT EXTRACTION     OS - Summer 2010   CERVICAL FUSION  09/24/2008   4 rods and  pins in place   CHOLECYSTECTOMY     COLONOSCOPY  12/15/2011   Procedure: COLONOSCOPY;  Surgeon: Charna Elizabeth, MD;  Location: [facility];  Service: Endoscopy;  Laterality: N/A;   LUMBAR FUSION  04/24/2010   W/Mechanical fixation - L2-5 [facility]   LUMBAR FUSION  09/25/2011   rods in hips (to stabilize).   MASTECTOMY     NECK SURGERY     rheumatoid nodule removal     SPINAL FUSION  07/25/2010   T10-L2 interbody fusion / [facility]   TONSILLECTOMY     TOTAL KNEE ARTHROPLASTY     Left    FAMILY HISTORY: Family History  Problem Relation Age of Onset   Brain cancer Mother    Hypertension Mother    Arthritis Mother    Dementia Father    Hypertension Father    Arthritis Father    Breast cancer Sister    Lung cancer Sister    Lung disease Neg Hx    Rheumatologic disease Neg Hx     SOCIAL HISTORY: Social History   Socioeconomic History   Marital status: Widowed    Spouse name: Not on file   Number of children: 1   Years of education: 12+ coll.   Highest education level: Not on file  Occupational History   Occupation: Clinical cytogeneticist: RETIRED    Comment: retired  Tobacco Use   Smoking status: Never   Smokeless tobacco: Never   Tobacco comments:    Father   Vaping Use   Vaping status: Never Used  Substance and Sexual Activity   Alcohol use: Not Currently    Comment: heavy drinker until 1995 - sobriety with AA   Drug use: No   Sexual activity: Not Currently  Other Topics Concern   Not on file  Social History Narrative   07/03/21 friend [name] living with her   Right handed   Drinks 6-8 cups caffeine daily      Social  Determinants of Health   Financial Resource Strain: Low Risk  (11/21/2022)   Overall Financial Resource Strain (CARDIA)    Difficulty of Paying Living Expenses: Not very hard  Food Insecurity: No Food Insecurity (02/26/2023)   Hunger Vital Sign    Worried About Running Out of Food in the Last Year: Never true    Ran Out of Food in the Last Year: Never true  Transportation Needs: No Transportation Needs (09/25/2022)   PRAPARE - Administrator, Civil Service (Medical): No    Lack of Transportation (Non-Medical): No  Physical Activity: Inactive (06/25/2022)   Exercise Vital Sign    Days of Exercise per Week: 0 days    Minutes of Exercise per Session: 0 min  Stress: No Stress Concern Present (06/25/2022)   [employer] of Occupational Health - Occupational Stress Questionnaire    Feeling of Stress : Not at all  Social Connections: Moderately Integrated (06/25/2022)   Social Connection and Isolation Panel [NHANES]    Frequency of Communication with Friends and Family: More than three times a week    Frequency of Social Gatherings with Friends and Family: More than three times a week    Attends Religious Services: More than 4 times per year    Active Member of [employer] or Organizations: Yes    Attends Banker Meetings: More than 4 times per year    Marital Status: Widowed  Intimate Partner Violence: Not At Risk (06/25/2022)  Humiliation, Afraid, Rape, and Kick questionnaire    Fear of Current or Ex-Partner: No    Emotionally Abused: No    Physically Abused: No    Sexually Abused: No   Otila Kluver, DNP  [facility] [address] [place], [place] [address] [phone]

## 2023-07-04 NOTE — Patient Instructions (Addendum)
We will continue the prednisone, may try alternating between 4 mg vs 3 mg, if worsening symptoms can go back to 4 mg. Monitor living alone, memory, ensure safe with living independent, driving.

## 2023-07-05 ENCOUNTER — Ambulatory Visit: Payer: Medicare Other | Admitting: Family Medicine

## 2023-07-05 ENCOUNTER — Encounter: Payer: Self-pay | Admitting: Family Medicine

## 2023-07-05 VITALS — BP 128/60 | HR 89 | Temp 97.9°F | Ht 61.0 in | Wt 124.1 lb

## 2023-07-05 DIAGNOSIS — M545 Low back pain, unspecified: Secondary | ICD-10-CM

## 2023-07-05 DIAGNOSIS — Z23 Encounter for immunization: Secondary | ICD-10-CM

## 2023-07-05 DIAGNOSIS — I10 Essential (primary) hypertension: Secondary | ICD-10-CM

## 2023-07-05 DIAGNOSIS — M4712 Other spondylosis with myelopathy, cervical region: Secondary | ICD-10-CM

## 2023-07-05 DIAGNOSIS — G8929 Other chronic pain: Secondary | ICD-10-CM

## 2023-07-05 DIAGNOSIS — J3489 Other specified disorders of nose and nasal sinuses: Secondary | ICD-10-CM

## 2023-07-05 MED ORDER — OXYCODONE HCL ER 10 MG PO T12A: 10.0000 mg | EXTENDED_RELEASE_TABLET | Freq: Two times a day (BID) | ORAL | 0 refills | Status: DC

## 2023-07-05 NOTE — Progress Notes (Signed)
Established Patient Office Visit  Subjective   Patient ID: [name], female    DOB: 14-Mar-1941  Age: 82 y.o. MRN: [id number]  Chief Complaint  Patient presents with   Medication Consultation    HPI   [name] is seen for medical follow-up.  She has chronic back pain on OxyContin 10 mg twice daily.  She has been on this dosage for several years.  She had some recent right periscapular pains and had shoulder injection here which did not seem to help much.  She saw her neurosurgeon and had recent x-rays of her cervical spine which showed no acute findings.  She was prescribed Zanaflex but has not yet started.  She states her pain basically ceased their home time around last Friday or so.  Denies any radiculitis symptoms.  No right upper extremity weakness.  She has had a couple of recent elevated blood pressures.  Was elevated at neurologist just yesterday 153/88 and up here 150/78 when she first checked in the day.  She relates frequent rhinorrhea with clear drippy nose symptoms.  No nosebleeds.  No purulent secretions.  No facial pain or headaches.  Taking OxyContin as above.  Feels like her current medication is working fairly well.  Will need refills sent in soon.  She is actually due on the 20 night but those these through [facility] and usually takes couple weeks to get her medication sent.  Last drug screen was 11/23  Past Medical History:  Diagnosis Date   Allergic rhinitis    Anemia    Anxiety    Bronchiectasis    Carpal tunnel syndrome 07/13/2015   Bilateral   CVA (cerebral infarction) 10/2007   Right thalamic    Diverticulosis of colon    Gait disorder    GERD (gastroesophageal reflux disease)    History of breast cancer    HTN (hypertension)    Hyperlipidemia    Idiopathic pulmonary fibrosis    Left knee DJD    Migraine    Osteoporosis    Peripheral neuropathy    PMR (polymyalgia rheumatica) (HCC)    Polyneuropathy in other diseases classified  elsewhere (HCC) 02/17/2013   Previous back surgery 10/09/12   Renal insufficiency    Rheumatoid arthritis (HCC)    Seizure disorder (HCC)    Spondylosis, cervical, with myelopathy 08/31/2015   C3-4 myelopathy   Temporal arteritis (HCC)    Right eye blind, on steroids per [facility]/ Dr Anne Hahn   Type II or unspecified type diabetes mellitus without mention of complication, not stated as uncontrolled    2nd to steriods   Vitamin D deficiency    Past Surgical History:  Procedure Laterality Date   APPENDECTOMY  01/2021   CARPAL TUNNEL RELEASE Right    CATARACT EXTRACTION     OS - Summer 2010   CERVICAL FUSION  09/24/2008   4 rods and pins in place   CHOLECYSTECTOMY     COLONOSCOPY  12/15/2011   Procedure: COLONOSCOPY;  Surgeon: Charna Elizabeth, MD;  Location: [facility];  Service: Endoscopy;  Laterality: N/A;   LUMBAR FUSION  04/24/2010   W/Mechanical fixation - L2-5 [facility]   LUMBAR FUSION  09/25/2011   rods in hips (to stabilize).   MASTECTOMY     NECK SURGERY     rheumatoid nodule removal     SPINAL FUSION  07/25/2010   T10-L2 interbody fusion / [facility]   TONSILLECTOMY     TOTAL KNEE ARTHROPLASTY  Left    reports that she has never smoked. She has never used smokeless tobacco. She reports that she does not currently use alcohol. She reports that she does not use drugs. family history includes Arthritis in her father and mother; Brain cancer in her mother; Breast cancer in her sister; Dementia in her father; Hypertension in her father and mother; Lung cancer in her sister. Allergies  Allergen Reactions   Atorvastatin Nausea Only and Other (See Comments)    Dizzyness.    Hydromorphone Other (See Comments)    Cognitive changes  "made me unconscious" Dilaudid    Review of Systems  Constitutional:  Negative for chills, fever, malaise/fatigue and weight loss.  HENT:  Negative for hearing loss and nosebleeds.   Eyes:  Negative for blurred vision and double  vision.  Respiratory:  Negative for cough and shortness of breath.   Cardiovascular:  Negative for chest pain, palpitations and leg swelling.  Gastrointestinal:  Negative for abdominal pain, blood in stool, constipation and diarrhea.  Genitourinary:  Negative for dysuria.  Musculoskeletal:  Positive for back pain.  Skin:  Negative for rash.  Neurological:  Negative for dizziness, speech change, seizures, loss of consciousness and headaches.  Psychiatric/Behavioral:  Negative for depression.       Objective:     BP 128/60 (BP Location: Right Arm, Cuff Size: Normal)   Pulse 89   Temp 97.9 F (36.6 C) (Oral)   Ht 5\' 1"  (1.549 m)   Wt 124 lb 1.6 oz (56.3 kg)   SpO2 99%   BMI 23.45 kg/m  BP Readings from Last 3 Encounters:  07/05/23 128/60  07/04/23 (!) 153/88  06/10/23 (!) 144/84   Wt Readings from Last 3 Encounters:  07/05/23 124 lb 1.6 oz (56.3 kg)  07/04/23 122 lb (55.3 kg)  06/07/23 122 lb 11.2 oz (55.7 kg)      Physical Exam Vitals reviewed.  Constitutional:      General: She is not in acute distress.    Appearance: She is well-developed.  Eyes:     Pupils: Pupils are equal, round, and reactive to light.  Neck:     Thyroid: No thyromegaly.     Vascular: No JVD.  Cardiovascular:     Rate and Rhythm: Normal rate and regular rhythm.     Heart sounds:     No gallop.  Pulmonary:     Effort: Pulmonary effort is normal. No respiratory distress.     Breath sounds: Normal breath sounds. No wheezing or rales.  Musculoskeletal:     Cervical back: Neck supple.  Neurological:     Mental Status: She is alert.      No results found for any visits on 07/05/23.  Last CBC Lab Results  Component Value Date   WBC 8.3 08/02/2022   HGB 12.9 08/02/2022   HCT 35.6 (L) 08/02/2022   MCV 99.4 08/02/2022   MCH 36.0 (H) 08/02/2022   RDW 13.2 08/02/2022   PLT 258 08/02/2022   Last metabolic panel Lab Results  Component Value Date   GLUCOSE 103 (H) 08/02/2022   NA 141  08/02/2022   K 3.7 08/02/2022   CL 103 08/02/2022   CO2 26 08/02/2022   BUN 15 08/02/2022   CREATININE 1.29 (H) 08/02/2022   GFRNONAA 41 (L) 08/02/2022   CALCIUM 8.1 (L) 08/02/2022   PHOS 2.0 (L) 08/02/2022   PROT 7.0 08/02/2022   ALBUMIN 3.4 (L) 08/02/2022   BILITOT 0.9 08/02/2022   ALKPHOS 66 08/02/2022  AST 21 08/02/2022   ALT 14 08/02/2022   ANIONGAP 12 08/02/2022   Last lipids Lab Results  Component Value Date   CHOL 143 08/03/2022   HDL 47 08/03/2022   LDLCALC 85 08/03/2022   TRIG 56 08/03/2022   CHOLHDL 3.0 08/03/2022   Last hemoglobin A1c Lab Results  Component Value Date   HGBA1C 5.0 08/03/2022      The ASCVD Risk score (Arnett DK, et al., 2019) failed to calculate for the following reasons:   The 2019 ASCVD risk score is only valid for ages 5 to 51    Assessment & Plan:   #1 chronic back pain with multiple prior surgeries.  Her current pain is fairly well-controlled on OxyContin 10 mg twice daily.  Will send refills through [facility] for 3 months.  Set up 42-month follow-up  #2 frequent rhinorrhea symptoms.  Suspect allergic.  Suggest she start with over-the-counter antihistamine such as Allegra, Zyrtec, or Claritin.  Add Flonase if not relieved with the above.  #3 hypertension history.  She is currently treated with amlodipine 5 mg daily.  Repeat blood pressure after rest significantly improved at 128/60.  Continue current dosage.  Continue low-sodium diet   Return in about 3 months (around 10/05/2023).    Evelena Peat, MD

## 2023-07-05 NOTE — Patient Instructions (Addendum)
Try over the counter Claritan, Allegra, or Zyrtec of nasal drip symptoms  If that is not adequate, add over the counter Flonase.  Set up 3 month follow up.

## 2023-07-09 DIAGNOSIS — N1832 Chronic kidney disease, stage 3b: Secondary | ICD-10-CM | POA: Diagnosis not present

## 2023-07-09 DIAGNOSIS — D631 Anemia in chronic kidney disease: Secondary | ICD-10-CM | POA: Diagnosis not present

## 2023-07-09 DIAGNOSIS — I129 Hypertensive chronic kidney disease with stage 1 through stage 4 chronic kidney disease, or unspecified chronic kidney disease: Secondary | ICD-10-CM | POA: Diagnosis not present

## 2023-07-09 DIAGNOSIS — R32 Unspecified urinary incontinence: Secondary | ICD-10-CM | POA: Diagnosis not present

## 2023-07-09 DIAGNOSIS — M4802 Spinal stenosis, cervical region: Secondary | ICD-10-CM | POA: Diagnosis not present

## 2023-07-09 DIAGNOSIS — J8410 Pulmonary fibrosis, unspecified: Secondary | ICD-10-CM | POA: Diagnosis not present

## 2023-07-09 DIAGNOSIS — N189 Chronic kidney disease, unspecified: Secondary | ICD-10-CM | POA: Diagnosis not present

## 2023-07-09 DIAGNOSIS — M069 Rheumatoid arthritis, unspecified: Secondary | ICD-10-CM | POA: Diagnosis not present

## 2023-07-10 LAB — LAB REPORT - SCANNED
Creatinine, POC: 32.8 mg/dL
EGFR: 45

## 2023-07-12 ENCOUNTER — Ambulatory Visit (INDEPENDENT_AMBULATORY_CARE_PROVIDER_SITE_OTHER): Payer: Medicare Other

## 2023-07-12 VITALS — Ht 61.0 in | Wt 122.0 lb

## 2023-07-12 DIAGNOSIS — Z0000 Encounter for general adult medical examination without abnormal findings: Secondary | ICD-10-CM

## 2023-07-12 NOTE — Patient Instructions (Addendum)
[name] , Thank you for taking time to come for your Medicare Wellness Visit. I appreciate your ongoing commitment to your health goals. Please review the following plan we discussed and let me know if I can assist you in the future.   Referrals/Orders/Follow-Ups/Clinician Recommendations:   This is a list of the screening recommended for you and due dates:  Health Maintenance  Topic Date Due   Eye exam for diabetics  Never done   Yearly kidney health urinalysis for diabetes  Never done   Zoster (Shingles) Vaccine (1 of 2) 05/05/1960   Complete foot exam   04/26/2018   COVID-19 Vaccine (3 - Pfizer risk series) 12/27/2019   DTaP/Tdap/Td vaccine (2 - Tdap) 02/27/2020   Hemoglobin A1C  02/01/2023   Yearly kidney function blood test for diabetes  08/03/2023   Mammogram  10/26/2023   Medicare Annual Wellness Visit  07/11/2024   Pneumonia Vaccine  Completed   Flu Shot  Completed   DEXA scan (bone density measurement)  Completed   HPV Vaccine  Aged Out  Opioid Pain Medicine Management Opioids are powerful medicines that are used to treat moderate to severe pain. When used for short periods of time, they can help you to: Sleep better. Do better in physical or occupational therapy. Feel better in the first few days after an injury. Recover from surgery. Opioids should be taken with the supervision of a trained health care provider. They should be taken for the shortest period of time possible. This is because opioids can be addictive, and the longer you take opioids, the greater your risk of addiction. This addiction can also be called opioid use disorder. What are the risks? Using opioid pain medicines for longer than 3 days increases your risk of side effects. Side effects include: Constipation. Nausea and vomiting. Breathing difficulties (respiratory depression). Drowsiness. Confusion. Opioid use disorder. Itching. Taking opioid pain medicine for a long period of time can affect  your ability to do daily tasks. It also puts you at risk for: Motor vehicle crashes. Depression. Suicide. Heart attack. Overdose, which can be life-threatening. What is a pain treatment plan? A pain treatment plan is an agreement between you and your health care provider. Pain is unique to each person, and treatments vary depending on your condition. To manage your pain, you and your health care provider need to work together. To help you do this: Discuss the goals of your treatment, including how much pain you might expect to have and how you will manage the pain. Review the risks and benefits of taking opioid medicines. Remember that a good treatment plan uses more than one approach and minimizes the chance of side effects. Be honest about the amount of medicines you take and about any drug or alcohol use. Get pain medicine prescriptions from only one health care provider. Pain can be managed with many types of alternative treatments. Ask your health care provider to refer you to one or more specialists who can help you manage pain through: Physical or occupational therapy. Counseling (cognitive behavioral therapy). Good nutrition. Biofeedback. Massage. Meditation. Non-opioid medicine. Following a gentle exercise program. How to use opioid pain medicine Taking medicine Take your pain medicine exactly as told by your health care provider. Take it only when you need it. If your pain gets less severe, you may take less than your prescribed dose if your health care provider approves. If you are not having pain, do nottake pain medicine unless your health care provider tells  you to take it. If your pain is severe, do nottry to treat it yourself by taking more pills than instructed on your prescription. Contact your health care provider for help. Write down the times when you take your pain medicine. It is easy to become confused while on pain medicine. Writing the time can help you avoid  overdose. Take other over-the-counter or prescription medicines only as told by your health care provider. Keeping yourself and others safe  While you are taking opioid pain medicine: Do not drive, use machinery, or power tools. Do not sign legal documents. Do not drink alcohol. Do not take sleeping pills. Do not supervise children by yourself. Do not do activities that require climbing or being in high places. Do not go to a lake, river, ocean, spa, or swimming pool. Do not share your pain medicine with anyone. Keep pain medicine in a locked cabinet or in a secure area where pets and children cannot reach it. Stopping your use of opioids If you have been taking opioid medicine for more than a few weeks, you may need to slowly decrease (taper) how much you take until you stop completely. Tapering your use of opioids can decrease your risk of symptoms of withdrawal, such as: Pain and cramping in the abdomen. Nausea. Sweating. Sleepiness. Restlessness. Uncontrollable shaking (tremors). Cravings for the medicine. Do not attempt to taper your use of opioids on your own. Talk with your health care provider about how to do this. Your health care provider may prescribe a step-down schedule based on how much medicine you are taking and how long you have been taking it. Getting rid of leftover pills Do not save any leftover pills. Get rid of leftover pills safely by: Taking the medicine to a prescription take-back program. This is usually offered by the county or law enforcement. Bringing them to a pharmacy that has a drug disposal container. Flushing them down the toilet. Check the label or package insert of your medicine to see whether this is safe to do. Throwing them out in the trash. Check the label or package insert of your medicine to see whether this is safe to do. If it is safe to throw it out, remove the medicine from the original container, put it into a sealable bag or container, and  mix it with used coffee grounds, food scraps, dirt, or cat litter before putting it in the trash. Follow these instructions at home: Activity Do exercises as told by your health care provider. Avoid activities that make your pain worse. Return to your normal activities as told by your health care provider. Ask your health care provider what activities are safe for you. General instructions You may need to take these actions to prevent or treat constipation: Drink enough fluid to keep your urine pale yellow. Take over-the-counter or prescription medicines. Eat foods that are high in fiber, such as beans, whole grains, and fresh fruits and vegetables. Limit foods that are high in fat and processed sugars, such as fried or sweet foods. Keep all follow-up visits. This is important. Where to find support If you have been taking opioids for a long time, you may benefit from receiving support for quitting from a local support group or counselor. Ask your health care provider for a referral to these resources in your area. Where to find more information Centers for Disease Control and Prevention (CDC): FootballExhibition.com.br [place] Food and Drug Administration (FDA): PumpkinSearch.com.ee Get help right away if: You may have taken too  much of an opioid (overdosed). Common symptoms of an overdose: Your breathing is slower or more shallow than normal. You have a very slow heartbeat (pulse). You have slurred speech. You have nausea and vomiting. Your pupils become very small. You have other potential symptoms: You are very confused. You faint or feel like you will faint. You have cold, clammy skin. You have blue lips or fingernails. You have thoughts of harming yourself or harming others. These symptoms may represent a serious problem that is an emergency. Do not wait to see if the symptoms will go away. Get medical help right away. Call your local emergency services (911 in the [place] Do not drive yourself to the  hospital.  If you ever feel like you may hurt yourself or others, or have thoughts about taking your own life, get help right away. Go to your nearest emergency department or: Call your local emergency services (911 in the [place] Call the [facility] ((713)498-8183 in the [place] Call a suicide crisis helpline, such as the National Suicide Prevention Lifeline at 670-455-0327 or 988 in the [place] This is open 24 hours a day in the [place] Text the Crisis Text Line at (867)623-3817 (in the [place] Summary Opioid medicines can help you manage moderate to severe pain for a short period of time. A pain treatment plan is an agreement between you and your health care provider. Discuss the goals of your treatment, including how much pain you might expect to have and how you will manage the pain. If you think that you or someone else may have taken too much of an opioid, get medical help right away. This information is not intended to replace advice given to you by your health care provider. Make sure you discuss any questions you have with your health care provider. Document Revised: 04/05/2021 Document Reviewed: 12/21/2020 Elsevier Patient Education  2024 [employer]   Advanced directives: (In Chart) A copy of your advanced directives are scanned into your chart should your provider ever need it.  Next Medicare Annual Wellness Visit scheduled for next year: Yes

## 2023-07-12 NOTE — Progress Notes (Signed)
Subjective:   [name] is a 82 y.o. female who presents for Medicare Annual (Subsequent) preventive examination.  Visit Complete: Virtual I connected with  Kemper Durie on 07/12/23 by a audio enabled telemedicine application and verified that I am speaking with the correct person using two identifiers.  Patient Location: Home  Provider Location: Home Office  I discussed the limitations of evaluation and management by telemedicine. The patient expressed understanding and agreed to proceed.  Vital Signs: Because this visit was a virtual/telehealth visit, some criteria may be missing or patient reported. Any vitals not documented were not able to be obtained and vitals that have been documented are patient reported.    Cardiac Risk Factors include: advanced age (>73men, >46 women);hypertension     Objective:    Today's Vitals   07/12/23 0952  Weight: 122 lb (55.3 kg)  Height: 5\' 1"  (1.549 m)   Body mass index is 23.05 kg/m.     07/12/2023   10:00 AM 05/24/2023    9:30 PM 08/02/2022    1:28 PM 07/30/2022    4:03 PM 07/13/2022   11:46 AM 07/09/2022    9:54 PM 06/25/2022   11:10 AM  Advanced Directives  Does Patient Have a Medical Advance Directive? Yes Yes Yes Yes Yes No Yes  Type of Estate agent of [place];Living will Living will;Healthcare Power of Attorney Living will    Healthcare Power of [place];Living will  Does patient want to make changes to medical advance directive? No - Patient declined   No - Patient declined No - Patient declined No - Patient declined No - Patient declined  Copy of Healthcare Power of Attorney in Chart? Yes - validated most recent copy scanned in chart (See row information)      Yes - validated most recent copy scanned in chart (See row information)  Would patient like information on creating a medical advance directive?    No - Patient declined  No - Patient declined     Current Medications  (verified) Outpatient Encounter Medications as of 07/12/2023  Medication Sig   amLODipine (NORVASC) 5 MG tablet TAKE 1 TABLET DAILY   aspirin EC 81 MG tablet Take 1 tablet (81 mg total) by mouth daily. Swallow whole.   Calcium Carb-Cholecalciferol (CALCIUM CARBONATE-VITAMIN D3 PO) Take 1 tablet by mouth daily   cholecalciferol (VITAMIN D) 1000 UNITS tablet Take 1,000 Units by mouth daily.   denosumab (PROLIA) 60 MG/ML SOLN injection Inject 60 mg into the skin every 6 (six) months.   docusate sodium (COLACE) 100 MG capsule Take 100 mg by mouth 2 (two) times daily.   furosemide (LASIX) 40 MG tablet TAKE 1 TABLET DAILY   gabapentin (NEURONTIN) 300 MG capsule TAKE 1 CAPSULE TWICE A DAY   Golimumab (SIMPONI ARIA IV) Inject into the vein. Takes infusion every 8 wks.   Multiple Vitamins-Minerals (MULTIVITAMIN,TX-MINERALS) tablet Take 1 tablet by mouth daily.   Omega-3 Fatty Acids (FISH OIL OMEGA-3 PO) Take 1 capsule by mouth daily.   oxyCODONE (OXYCONTIN) 10 mg 12 hr tablet Take 1 tablet (10 mg total) by mouth every 12 (twelve) hours.   Oxycodone HCl 10 MG TABS Take 1 tablet (10 mg total) by mouth every 6 (six) hours as needed. Take 1 tablet by mouth every 6 hours as needed for breakthrough pain   potassium chloride (KLOR-CON M) 10 MEQ tablet TAKE 1 TABLET TWICE A DAY   predniSONE (DELTASONE) 1 MG tablet TAKE 4 TABLETS DAILY WITH  BREAKFAST   vitamin B-12 (CYANOCOBALAMIN) 100 MCG tablet Take 100 mcg by mouth daily.   No facility-administered encounter medications on file as of 07/12/2023.    Allergies (verified) Atorvastatin and Hydromorphone   History: Past Medical History:  Diagnosis Date   Allergic rhinitis    Anemia    Anxiety    Bronchiectasis    Carpal tunnel syndrome 07/13/2015   Bilateral   CVA (cerebral infarction) 10/2007   Right thalamic    Diverticulosis of colon    Gait disorder    GERD (gastroesophageal reflux disease)    History of breast cancer    HTN (hypertension)     Hyperlipidemia    Idiopathic pulmonary fibrosis    Left knee DJD    Migraine    Osteoporosis    Peripheral neuropathy    PMR (polymyalgia rheumatica) (HCC)    Polyneuropathy in other diseases classified elsewhere (HCC) 02/17/2013   Previous back surgery 10/09/12   Renal insufficiency    Rheumatoid arthritis (HCC)    Seizure disorder (HCC)    Spondylosis, cervical, with myelopathy 08/31/2015   C3-4 myelopathy   Temporal arteritis (HCC)    Right eye blind, on steroids per [facility]/ Dr Anne Hahn   Type II or unspecified type diabetes mellitus without mention of complication, not stated as uncontrolled    2nd to steriods   Vitamin D deficiency    Past Surgical History:  Procedure Laterality Date   APPENDECTOMY  01/2021   CARPAL TUNNEL RELEASE Right    CATARACT EXTRACTION     OS - Summer 2010   CERVICAL FUSION  09/24/2008   4 rods and pins in place   CHOLECYSTECTOMY     COLONOSCOPY  12/15/2011   Procedure: COLONOSCOPY;  Surgeon: Charna Elizabeth, MD;  Location: [facility];  Service: Endoscopy;  Laterality: N/A;   LUMBAR FUSION  04/24/2010   W/Mechanical fixation - L2-5 [facility]   LUMBAR FUSION  09/25/2011   rods in hips (to stabilize).   MASTECTOMY     NECK SURGERY     rheumatoid nodule removal     SPINAL FUSION  07/25/2010   T10-L2 interbody fusion / [facility]   TONSILLECTOMY     TOTAL KNEE ARTHROPLASTY     Left   Family History  Problem Relation Age of Onset   Brain cancer Mother    Hypertension Mother    Arthritis Mother    Dementia Father    Hypertension Father    Arthritis Father    Breast cancer Sister    Lung cancer Sister    Lung disease Neg Hx    Rheumatologic disease Neg Hx    Social History   Socioeconomic History   Marital status: Widowed    Spouse name: Not on file   Number of children: 1   Years of education: 12+ coll.   Highest education level: Not on file  Occupational History   Occupation: Nurse, learning disability: RETIRED    Comment: retired  Tobacco Use   Smoking status: Never   Smokeless tobacco: Never   Tobacco comments:    Father   Vaping Use   Vaping status: Never Used  Substance and Sexual Activity   Alcohol use: Not Currently    Comment: heavy drinker until 1995 - sobriety with AA   Drug use: No   Sexual activity: Not Currently  Other Topics Concern   Not on file  Social History Narrative   07/03/21  friend Elease Hashimoto living with her   Right handed   Drinks 6-8 cups caffeine daily      Social Determinants of Health   Financial Resource Strain: Low Risk  (07/12/2023)   Overall Financial Resource Strain (CARDIA)    Difficulty of Paying Living Expenses: Not hard at all  Food Insecurity: No Food Insecurity (07/12/2023)   Hunger Vital Sign    Worried About Running Out of Food in the Last Year: Never true    Ran Out of Food in the Last Year: Never true  Transportation Needs: No Transportation Needs (07/12/2023)   PRAPARE - Administrator, Civil Service (Medical): No    Lack of Transportation (Non-Medical): No  Physical Activity: Inactive (07/12/2023)   Exercise Vital Sign    Days of Exercise per Week: 0 days    Minutes of Exercise per Session: 0 min  Stress: No Stress Concern Present (07/12/2023)   [employer] of Occupational Health - Occupational Stress Questionnaire    Feeling of Stress : Not at all  Social Connections: Moderately Integrated (07/12/2023)   Social Connection and Isolation Panel [NHANES]    Frequency of Communication with Friends and Family: More than three times a week    Frequency of Social Gatherings with Friends and Family: More than three times a week    Attends Religious Services: More than 4 times per year    Active Member of [employer] or Organizations: Yes    Attends Banker Meetings: More than 4 times per year    Marital Status: Widowed    Tobacco Counseling Counseling given: Not Answered Tobacco  comments: Father    Clinical Intake:  Pre-visit preparation completed: Yes  Pain : No/denies pain     BMI - recorded: 23.05 Nutritional Status: BMI of 19-24  Normal Nutritional Risks: None Diabetes: No  How often do you need to have someone help you when you read instructions, pamphlets, or other written materials from your doctor or pharmacy?: 1 - Never  Interpreter Needed?: No  Information entered by :: Theresa Mulligan LPN   Activities of Daily Living    07/12/2023    9:58 AM  In your present state of health, do you have any difficulty performing the following activities:  Hearing? 0  Vision? 0  Difficulty concentrating or making decisions? 0  Walking or climbing stairs? 0  Dressing or bathing? 0  Doing errands, shopping? 0  Preparing Food and eating ? N  Using the Toilet? N  In the past six months, have you accidently leaked urine? N  Do you have problems with loss of bowel control? N  Managing your Medications? N  Managing your Finances? N  Housekeeping or managing your Housekeeping? N    Patient Care Team: Kristian Covey, MD as PCP - General (Family Medicine) York Spaniel, MD (Inactive) (Neurology) Donnetta Hail, MD as Consulting Physician (Rheumatology) Janalyn Harder, MD (Inactive) as Consulting Physician (Dermatology) Sherrill Raring, [facility] (Pharmacist)  Indicate any recent Medical Services you may have received from other than [facility] providers in the past year (date may be approximate).     Assessment:   This is a routine wellness examination for [place].  Hearing/Vision screen Hearing Screening - Comments:: Denies hearing difficulties   Vision Screening - Comments:: Wears reading glasses - up to date with routine eye exams with  Dr [name] Filbert   Goals Addressed               This Visit's  Progress     Increase physical activity (pt-stated)         Depression Screen    07/12/2023    9:57 AM 09/25/2022    1:50 PM 06/25/2022   11:03  AM 01/15/2022   11:17 AM 10/27/2021   10:03 AM 06/22/2021   11:52 AM 06/22/2021   11:50 AM  PHQ 2/9 Scores  PHQ - 2 Score 0 0 0 0 0 0 0    Fall Risk    07/12/2023    9:59 AM 07/17/2022   11:26 AM 06/25/2022   11:07 AM 01/24/2022   11:34 AM 01/15/2022   11:12 AM  Fall Risk   Falls in the past year? 1 1 1  0 1  Number falls in past yr: 0 1 1 0 1  Injury with Fall? 0 1 0 0 0  Comment   No injury or medical attention needed    Risk for fall due to : No Fall Risks Impaired balance/gait Other (Comment) No Fall Risks History of fall(s);Impaired mobility;Impaired balance/gait  Risk for fall due to: Comment   Patient stated loses balance at times. Uses walker    Follow up Falls prevention discussed Falls evaluation completed Falls prevention discussed Falls evaluation completed Falls prevention discussed;Education provided    MEDICARE RISK AT HOME: Medicare Risk at Home Any stairs in or around the home?: Yes If so, are there any without handrails?: No Home free of loose throw rugs in walkways, pet beds, electrical cords, etc?: Yes Adequate lighting in your home to reduce risk of falls?: Yes Life alert?: No Use of a cane, walker or w/c?: Yes Grab bars in the bathroom?: Yes Shower chair or bench in shower?: Yes Elevated toilet seat or a handicapped toilet?: Yes  TIMED UP AND GO:  Was the test performed?  No    Cognitive Function:      07/04/2023    1:39 PM 12/20/2022   12:22 PM  Montreal Cognitive Assessment   Visuospatial/ Executive (0/5) 4 4  Naming (0/3) 3 3  Attention: Read list of digits (0/2) 2 2  Attention: Read list of letters (0/1) 1 1  Attention: Serial 7 subtraction starting at 100 (0/3) 2 2  Language: Repeat phrase (0/2) 2 1  Language : Fluency (0/1) 1 1  Abstraction (0/2) 2 2  Delayed Recall (0/5) 1 4  Orientation (0/6) 6 6  Total 24 26  Adjusted Score (based on education)  26      07/12/2023   10:00 AM 06/25/2022   11:10 AM 06/13/2020   11:01 AM  6CIT Screen   What Year? 0 points 0 points 0 points  What month? 0 points 0 points 0 points  What time? 0 points 0 points 0 points  Count back from 20 0 points 0 points 0 points  Months in reverse 0 points 0 points 0 points  Repeat phrase 0 points 2 points 0 points  Total Score 0 points 2 points 0 points    Immunizations Immunization History  Administered Date(s) Administered   Fluad Quad(high Dose 65+) 06/30/2019, 07/13/2020, 06/12/2021, 06/26/2022   Fluad Trivalent(High Dose 65+) 07/05/2023   H1N1 10/19/2008   Influenza Split 07/20/2012   Influenza Whole 06/28/2006, 07/08/2007, 07/07/2008, 07/07/2009   Influenza, High Dose Seasonal PF 07/14/2013, 07/30/2017, 07/02/2018   Influenza,inj,Quad PF,6+ Mos 07/07/2014   Influenza-Unspecified 07/15/2015, 07/24/2016   PFIZER(Purple Top)SARS-COV-2 Vaccination 11/07/2019, 11/29/2019   Pneumococcal Conjugate-13 07/28/2013   Pneumococcal Polysaccharide-23 06/28/2006   Td 10/28/2003   Tdap  02/27/2020   Zoster, Live 07/07/2008    TDAP status: Due, Education has been provided regarding the importance of this vaccine. Advised may receive this vaccine at local pharmacy or Health Dept. Aware to provide a copy of the vaccination record if obtained from local pharmacy or Health Dept. Verbalized acceptance and understanding.  Flu Vaccine status: Up to date  Pneumococcal vaccine status: Up to date  Covid-19 vaccine status: Declined, Education has been provided regarding the importance of this vaccine but patient still declined. Advised may receive this vaccine at local pharmacy or Health Dept.or vaccine clinic. Aware to provide a copy of the vaccination record if obtained from local pharmacy or Health Dept. Verbalized acceptance and understanding.  Qualifies for Shingles Vaccine? Yes   Zostavax completed No   Shingrix Completed?: No.    Education has been provided regarding the importance of this vaccine. Patient has been advised to call insurance company to  determine out of pocket expense if they have not yet received this vaccine. Advised may also receive vaccine at local pharmacy or Health Dept. Verbalized acceptance and understanding.  Screening Tests Health Maintenance  Topic Date Due   OPHTHALMOLOGY EXAM  Never done   Diabetic kidney evaluation - Urine ACR  Never done   Zoster Vaccines- Shingrix (1 of 2) 05/05/1960   FOOT EXAM  04/26/2018   COVID-19 Vaccine (3 - Pfizer risk series) 12/27/2019   DTaP/Tdap/Td (2 - Tdap) 02/27/2020   HEMOGLOBIN A1C  02/01/2023   Diabetic kidney evaluation - eGFR measurement  08/03/2023   MAMMOGRAM  10/26/2023   Medicare Annual Wellness (AWV)  07/11/2024   Pneumonia Vaccine 36+ Years old  Completed   INFLUENZA VACCINE  Completed   DEXA SCAN  Completed   HPV VACCINES  Aged Out    Health Maintenance  Health Maintenance Due  Topic Date Due   OPHTHALMOLOGY EXAM  Never done   Diabetic kidney evaluation - Urine ACR  Never done   Zoster Vaccines- Shingrix (1 of 2) [birth year]1959   FOOT EXAM  04/26/2018   COVID-19 Vaccine (3 - Pfizer risk series) 12/27/2019   DTaP/Tdap/Td (2 - Tdap) 02/27/2020   HEMOGLOBIN A1C  02/01/2023   Diabetic kidney evaluation - eGFR measurement  08/03/2023      Mammogram status: Completed 10/25/22. Repeat every year  Bone Density status: Completed 07/22/19. Results reflect: Bone density results: OSTEOPOROSIS. Repeat every   years.    Additional Screening:    Vision Screening: Recommended annual ophthalmology exams for early detection of glaucoma and other disorders of the eye. Is the patient up to date with their annual eye exam?  Yes  Who is the provider or what is the name of the office in which the patient attends annual eye exams? Dr [name] Filbert If pt is not established with a provider, would they like to be referred to a provider to establish care? No .   Dental Screening: Recommended annual dental exams for proper oral hygiene    Community Resource Referral / Chronic  Care Management:  CRR required this visit?  No   CCM required this visit?  No     Plan:     I have personally reviewed and noted the following in the patient's chart:   Medical and social history Use of alcohol, tobacco or illicit drugs  Current medications and supplements including opioid prescriptions. Patient is currently taking opioid prescriptions. Information provided to patient regarding non-opioid alternatives. Patient advised to discuss non-opioid treatment plan with their provider. Functional ability  and status Nutritional status Physical activity Advanced directives List of other physicians Hospitalizations, surgeries, and ER visits in previous 12 months Vitals Screenings to include cognitive, depression, and falls Referrals and appointments  In addition, I have reviewed and discussed with patient certain preventive protocols, quality metrics, and best practice recommendations. A written personalized care plan for preventive services as well as general preventive health recommendations were provided to patient.     Tillie Rung, LPN   [id number]/06/[id number]   After Visit Summary: (MyChart) Due to this being a telephonic visit, the after visit summary with patients personalized plan was offered to patient via MyChart   Nurse Notes: None

## 2023-07-15 NOTE — Therapy (Unsigned)
OUTPATIENT PHYSICAL THERAPY SHOULDER EVALUATION   Patient Name: [name] MRN: [id number] DOB:May 25, 1941, 82 y.o., female Today's Date: 07/17/2023  END OF SESSION:  PT End of Session - 07/16/23 1232     Visit Number 1    Number of Visits 16    Date for PT Re-Evaluation 09/10/23    Authorization Type Medicare,Tricare    PT Start Time 1230    PT Stop Time 1320    PT Time Calculation (min) 50 min    Activity Tolerance Patient tolerated treatment well    Behavior During Therapy WFL for tasks assessed/performed             Past Medical History:  Diagnosis Date   Allergic rhinitis    Anemia    Anxiety    Bronchiectasis    Carpal tunnel syndrome 07/13/2015   Bilateral   CVA (cerebral infarction) 10/2007   Right thalamic    Diverticulosis of colon    Gait disorder    GERD (gastroesophageal reflux disease)    History of breast cancer    HTN (hypertension)    Hyperlipidemia    Idiopathic pulmonary fibrosis    Left knee DJD    Migraine    Osteoporosis    Peripheral neuropathy    PMR (polymyalgia rheumatica) (HCC)    Polyneuropathy in other diseases classified elsewhere (HCC) 02/17/2013   Previous back surgery 10/09/12   Renal insufficiency    Rheumatoid arthritis (HCC)    Seizure disorder (HCC)    Spondylosis, cervical, with myelopathy 08/31/2015   C3-4 myelopathy   Temporal arteritis (HCC)    Right eye blind, on steroids per [facility]/ Dr Anne Hahn   Type II or unspecified type diabetes mellitus without mention of complication, not stated as uncontrolled    2nd to steriods   Vitamin D deficiency    Past Surgical History:  Procedure Laterality Date   APPENDECTOMY  01/2021   CARPAL TUNNEL RELEASE Right    CATARACT EXTRACTION     OS - Summer 2010   CERVICAL FUSION  09/24/2008   4 rods and pins in place   CHOLECYSTECTOMY     COLONOSCOPY  12/15/2011   Procedure: COLONOSCOPY;  Surgeon: Charna Elizabeth, MD;  Location: [facility];  Service: Endoscopy;  Laterality:  N/A;   LUMBAR FUSION  04/24/2010   W/Mechanical fixation - L2-5 [facility]   LUMBAR FUSION  09/25/2011   rods in hips (to stabilize).   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) 08/25/2019   Pain management 10/30/2017   Rheumatoid arthritis (HCC) 04/25/2017   S/P cervical spinal fusion 09/05/2015   Spondylosis, cervical, with myelopathy 08/31/2015   Carpal tunnel syndrome 07/13/2015   Torn ear lobe 06/07/2015   Obesity (BMI 30-39.9) 04/06/2014   Shortness of breath 07/14/2013   Bone cyst 07/14/2013   Polyneuropathy in other  diseases classified elsewhere (HCC) 02/17/2013   Chronic back pain 12/09/2012   Peripheral edema 09/25/2012   Anemia due to chronic illness 09/01/2012   DDD (degenerative disc disease), lumbosacral 08/12/2012   Cervicalgia 07/11/2012   Disturbance of skin sensation 07/11/2012   Temporal arteritis (HCC) 04/01/2012   Lower GI bleed 12/24/2011   Rectal bleed 12/13/2011   Arthrodesis status 09/04/2011   Chronic renal insufficiency, stage II (mild) 04/04/2011   LUMBAR RADICULOPATHY, LEFT 04/18/2009   Essential hypertension 12/30/2007   SEIZURE DISORDER 12/30/2007   CEREBROVASCULAR ACCIDENT, HX OF 12/30/2007   Hyperlipidemia 10/08/2007   GERD 10/08/2007   DIVERTICULOSIS, COLON 10/08/2007   BRONCHIECTASIS 06/26/2007   PULMONARY FIBROSIS 05/17/2007   Osteoporosis 05/17/2007   BREAST CANCER, HX OF  05/17/2007   MASTECTOMY, LEFT, HX OF 05/17/2007    PCP: Evelena Peat MD   REFERRING PROVIDER: Tressie Stalker MD   REFERRING DIAG: Rt shoulder pain   THERAPY DIAG:  Cervicalgia  Myofascial neck pain  Other abnormalities of gait and mobility  Abnormal posture  Rationale for Evaluation and Treatment: Rehabilitation  ONSET DATE: Aug. 2024  SUBJECTIVE:                                                                                                                                                                                      SUBJECTIVE STATEMENT: Pt referred by Dr. Lovell Sheehan.  She reports she had an onset of Rt sided neck and Rt UE pain back in Aug. The doctor gave her a shot and It is better.  She says she had to assist her sister with mobility for some time back in the summer and hse feels she may have strained a muscle.   She reports the doctor told her to stay in for while while she healed and she did that.  In the process she feels her legs are weaker and her mobility is not what it used to be. She tries to get out each day and walk in the store or to see a friend.  The pain is not there today.  She states it is  hard to curl hair, reaching out with her Rt arm at times.    Hand dominance: Right  PERTINENT HISTORY: RA, back pain , PMR, osteoporosis, breast cancer, CVA, blind Rt Eye Cervical fusion x 2  Lumbar surgery x 4  '11 lumbar spine surgery x 2: diskectomy, laminectomy and internal fixation at 4 levels.    Jan '14 - redo spinal surgery by Dr. Wyline Mood, [facility]  PAIN:  Are you having pain? Yes: NPRS scale: none today at rest, 5 /10 Pain location: Rt shoulder, neck  Pain description:  sore, tight Aggravating factors: reaching out and up Relieving factors: Medication, relaxing , tylenol, heating pad    PRECAUTIONS: Fall 2-3 falls this year, trips on her foot Needs UE   RED FLAGS: None   WEIGHT BEARING RESTRICTIONS: No  FALLS:  Has patient fallen  in last 6 months? No  LIVING ENVIRONMENT: Lives with: lives with their family Lives in: House/apartment Stairs: No has a ramp, has 14 steps to attic and she rarely does it  Has following equipment at home: Single point cane, Environmental consultant - 4 wheeled, and Wheelchair (manual) Has a cleaning lady, every 3 weeks   OCCUPATION: retired  PLOF: Independent and Needs assistance with homemaking  PATIENT GOALS:Walk better   NEXT MD VISIT:   OBJECTIVE:  Note: Objective measures were completed at Evaluation unless otherwise noted.  DIAGNOSTIC FINDINGS:  XR done recent: Rt shoulder  Advanced degenerative changes in the right AC joint with subacromial and subclavicular spurring. Glenohumeral joint is intact. No acute bony abnormality. Specifically, no fracture, subluxation, or dislocation.   IMPRESSION: Advanced degenerative changes in the [facility] joint. No acute bony abnormality.   PATIENT SURVEYS:  FOTO 43% goal is 63%  COGNITION: Overall cognitive status: Within functional limits for tasks assessed     SENSATION: WFL  POSTURE: Forward head, thoracic kyphosis, pelvic asymmetries, scoliosis   UPPER EXTREMITY ROM:   Active ROM Right eval Left eval  Shoulder flexion 120, full PROM in supine  120  Shoulder extension    Shoulder abduction 100 deg supine PROM    Shoulder adduction    Shoulder internal rotation WNL  WNL  Shoulder external rotation FR C7 Pain with PROM in supine with abduction  FR to C7   Elbow flexion    Elbow extension    Wrist flexion    Wrist extension    Wrist ulnar deviation    Wrist radial deviation    Wrist pronation    Wrist supination    (Blank rows = not tested)  UPPER EXTREMITY MMT:  MMT Right eval Left eval  Shoulder flexion 4 4-  Shoulder extension    Shoulder abduction 4 4-  Shoulder adduction    Shoulder internal rotation    Shoulder external rotation    Middle trapezius    Lower trapezius    Elbow flexion 5 5  Elbow extension    Wrist  flexion    Wrist extension    Wrist ulnar deviation    Wrist radial deviation    Wrist pronation    Wrist supination    Grip strength (lbs)    (Blank rows = not tested) CERVICAL ROM:   Active ROM A/PROM (deg) eval  Flexion 40  Extension 15  Right lateral flexion 5  Left lateral flexion 40  Right rotation 20  Left rotation 50   (Blank rows = not tested)   SHOULDER SPECIAL TESTS: Impingement tests:  None SLAP lesions:  NT Instability tests:  NT  JOINT MOBILITY TESTING:  Rt shoulder joint GH WFL  NT cervical but limited AROM C spine   PALPATION:  Sore along occipitals and Rt upper trap, Rt levator scap    TODAY'S TREATMENT:  DATE: 07/16/23  HEP: See HEP - performed each exercise below   PATIENT EDUCATION: Education details: HEP, POC, posture, modifications for activity  Person educated: Patient Education method: Explanation, Demonstration, Verbal cues, and Handouts Education comprehension: verbalized understanding  HOME EXERCISE PROGRAM: Access Code: [email or web address] URL: [email or web address] Date: 07/16/2023 Prepared by: Karie Mainland  Exercises - Supine Shoulder Horizontal Abduction with Resistance  - 1-2 x daily - 7 x weekly - 2 sets - 10 reps - 5 hold - Supine Shoulder External Rotation with Resistance  - 1-2 x daily - 7 x weekly - 2 sets - 10 reps - 5 hold - Supine Shoulder Flexion AAROM with Dowel  - 1-2 x daily - 7 x weekly - 2 sets - 10 reps - 5 hold - Seated Cervical Rotation AROM  - 1-2 x daily - 7 x weekly - 2 sets - 10 reps - 10 hold  ASSESSMENT:  CLINICAL IMPRESSION: Patient is a 82y.o. female who was seen today for physical therapy evaluation and treatment for right-sided neck and shoulder pain which seems consistent with myofascial pain, spasm.  She has significant limitations in cervical range of motion with  2 cervical surgeries and for lumbar surgeries.  Her main complaint today was actually her lower extremity strength and mobility.  She asked that we reach out to Dr. Lovell Sheehan to see if therapy could address overall posture, strength and mobility..   OBJECTIVE IMPAIRMENTS: Abnormal gait, decreased balance, decreased endurance, decreased mobility, difficulty walking, decreased ROM, decreased strength, hypomobility, increased fascial restrictions, increased muscle spasms, impaired flexibility, impaired UE functional use, impaired vision/preception, postural dysfunction, and pain.   ACTIVITY LIMITATIONS: carrying, lifting, bending, standing, squatting, sleeping, stairs, transfers, dressing, reach over head, hygiene/grooming, locomotion level, and caring for others  PARTICIPATION LIMITATIONS: cleaning, laundry, shopping, community activity, and church  PERSONAL FACTORS: Age, Past/current experiences, and 3+ comorbidities: Osteoporosis, spine surgeries,visual deficits R eye   are also affecting patient's functional outcome.   REHAB POTENTIAL: Good  CLINICAL DECISION MAKING: Evolving/moderate complexity  EVALUATION COMPLEXITY: Moderate   GOALS: Goals reviewed with patient? Yes  SHORT TERM GOALS: Target date: 08/13/2023    Pt will be I with HEP for UEs , posture Baseline: given on eval  Goal status: INITIAL  2.  Pt will be screened for balance and fall risk, goal set  Baseline: NT on eval  Goal status: INITIAL  3.  Pt will be able to perform hygiene and grooming with right arm without increased pain Baseline: Pain minimal to moderate Goal status: INITIAL  LONG TERM GOALS: Target date: 09/11/2023    Pt will be I with full HEP upon discharge for UE and LEs, mobilty in general  Baseline: Unknown Goal status: INITIAL  2.  Patient will be able to use bilateral upper extremities in standing for home tasks with improved confidence and min to no pain Baseline: causes pain  Goal status:  INITIAL  3.  Balance goal TBA based on results Baseline:  Goal status: INITIAL  4.  Foto score will improve to 63% or better for upper extremity function Baseline: 43% Goal status: INITIAL  5.  Mobility and gait goals to be assessed based on further testing once approved by Dr. Lovell Sheehan Baseline:  Goal status: INITIAL PLAN:  PT FREQUENCY: 2x/week  PT DURATION: 8 weeks  PLANNED INTERVENTIONS: 97164- PT Re-evaluation, 97110-Therapeutic exercises, 97530- Therapeutic activity, 97112- Neuromuscular re-education, 97535- Self Care, [id number]- Manual therapy, [phone]- Gait training, Patient/Family education, Balance training, Taping, Joint mobilization, Cryotherapy, and Moist heat  PLAN FOR NEXT SESSION: check MMT in LEs, balance, gait. Check HEP    Jimesha Rising, PT 07/17/2023, 9:53 AM   Karie Mainland, PT 07/17/23 10:12 AM Phone: [phone] Fax: [phone]

## 2023-07-16 ENCOUNTER — Ambulatory Visit: Payer: Medicare Other | Attending: Neurosurgery | Admitting: Physical Therapy

## 2023-07-16 ENCOUNTER — Encounter: Payer: Self-pay | Admitting: Physical Therapy

## 2023-07-16 DIAGNOSIS — R2689 Other abnormalities of gait and mobility: Secondary | ICD-10-CM | POA: Diagnosis not present

## 2023-07-16 DIAGNOSIS — R293 Abnormal posture: Secondary | ICD-10-CM | POA: Diagnosis not present

## 2023-07-16 DIAGNOSIS — M542 Cervicalgia: Secondary | ICD-10-CM | POA: Insufficient documentation

## 2023-07-22 DIAGNOSIS — H52203 Unspecified astigmatism, bilateral: Secondary | ICD-10-CM | POA: Diagnosis not present

## 2023-07-22 DIAGNOSIS — D3132 Benign neoplasm of left choroid: Secondary | ICD-10-CM | POA: Diagnosis not present

## 2023-07-24 ENCOUNTER — Ambulatory Visit: Payer: Medicare Other | Admitting: Physical Therapy

## 2023-07-24 ENCOUNTER — Encounter: Payer: Self-pay | Admitting: Physical Therapy

## 2023-07-24 DIAGNOSIS — M542 Cervicalgia: Secondary | ICD-10-CM | POA: Diagnosis not present

## 2023-07-24 DIAGNOSIS — R293 Abnormal posture: Secondary | ICD-10-CM

## 2023-07-24 DIAGNOSIS — R2689 Other abnormalities of gait and mobility: Secondary | ICD-10-CM

## 2023-07-24 NOTE — Therapy (Signed)
OUTPATIENT PHYSICAL THERAPY SHOULDER TREATMENT   Patient Name: [name] MRN: [id number] DOB:27-Jun-1941, 82 y.o., female Today's Date: 07/24/2023  END OF SESSION:  PT End of Session - 07/24/23 1159     Visit Number 2    Number of Visits 16    Date for PT Re-Evaluation 09/10/23    Authorization Type Medicare,Tricare    PT Start Time 1154    PT Stop Time 1232    PT Time Calculation (min) 38 min    Activity Tolerance Patient tolerated treatment well    Behavior During Therapy WFL for tasks assessed/performed              Past Medical History:  Diagnosis Date   Allergic rhinitis    Anemia    Anxiety    Bronchiectasis    Carpal tunnel syndrome 07/13/2015   Bilateral   CVA (cerebral infarction) 10/2007   Right thalamic    Diverticulosis of colon    Gait disorder    GERD (gastroesophageal reflux disease)    History of breast cancer    HTN (hypertension)    Hyperlipidemia    Idiopathic pulmonary fibrosis    Left knee DJD    Migraine    Osteoporosis    Peripheral neuropathy    PMR (polymyalgia rheumatica) (HCC)    Polyneuropathy in other diseases classified elsewhere (HCC) 02/17/2013   Previous back surgery 10/09/12   Renal insufficiency    Rheumatoid arthritis (HCC)    Seizure disorder (HCC)    Spondylosis, cervical, with myelopathy 08/31/2015   C3-4 myelopathy   Temporal arteritis (HCC)    Right eye blind, on steroids per Neruro/ Dr Anne Hahn   Type II or unspecified type diabetes mellitus without mention of complication, not stated as uncontrolled    2nd to steriods   Vitamin D deficiency    Past Surgical History:  Procedure Laterality Date   APPENDECTOMY  01/2021   CARPAL TUNNEL RELEASE Right    CATARACT EXTRACTION     OS - Summer 2010   CERVICAL FUSION  09/24/2008   4 rods and pins in place   CHOLECYSTECTOMY     COLONOSCOPY  12/15/2011   Procedure: COLONOSCOPY;  Surgeon: Charna Elizabeth, MD;  Location: [facility];  Service: Endoscopy;  Laterality:  N/A;   LUMBAR FUSION  04/24/2010   W/Mechanical fixation - L2-5 [facility]   LUMBAR FUSION  09/25/2011   rods in hips (to stabilize).   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) 08/25/2019   Pain management 10/30/2017   Rheumatoid arthritis (HCC) 04/25/2017   S/P cervical spinal fusion 09/05/2015   Spondylosis, cervical, with myelopathy 08/31/2015   Carpal tunnel syndrome 07/13/2015   Torn ear lobe 06/07/2015   Obesity (BMI 30-39.9) 04/06/2014   Shortness of breath 07/14/2013   Bone cyst 07/14/2013   Polyneuropathy in  other diseases classified elsewhere (HCC) 02/17/2013   Chronic back pain 12/09/2012   Peripheral edema 09/25/2012   Anemia due to chronic illness 09/01/2012   DDD (degenerative disc disease), lumbosacral 08/12/2012   Cervicalgia 07/11/2012   Disturbance of skin sensation 07/11/2012   Temporal arteritis (HCC) 04/01/2012   Lower GI bleed 12/24/2011   Rectal bleed 12/13/2011   Arthrodesis status 09/04/2011   Chronic renal insufficiency, stage II (mild) 04/04/2011   LUMBAR RADICULOPATHY, LEFT 04/18/2009   Essential hypertension 12/30/2007   SEIZURE DISORDER 12/30/2007   CEREBROVASCULAR ACCIDENT, HX OF 12/30/2007   Hyperlipidemia 10/08/2007   GERD 10/08/2007   DIVERTICULOSIS, COLON 10/08/2007   BRONCHIECTASIS 06/26/2007   PULMONARY FIBROSIS 05/17/2007   Osteoporosis 05/17/2007   BREAST CANCER, HX OF  05/17/2007   MASTECTOMY, LEFT, HX OF 05/17/2007    PCP: Evelena Peat MD   REFERRING PROVIDER: Tressie Stalker MD   REFERRING DIAG: Rt shoulder pain   THERAPY DIAG:  Cervicalgia  Myofascial neck pain  Other abnormalities of gait and mobility  Abnormal posture  Rationale for Evaluation and Treatment: Rehabilitation  ONSET DATE: Aug. 2024  SUBJECTIVE:                                                                                                                                                                                      SUBJECTIVE STATEMENT:   Patient reports a nerve problem in her LLE and it twitches , was doing this for 2 days.  No shoulder pain right now.    EVAL Pt referred by Dr. Lovell Sheehan.  She reports she had an onset of Rt sided neck and Rt UE pain back in Aug. The doctor gave her a shot and It is better.  She says she had to assist her sister with mobility for some time back in the summer and hse feels she may have strained a muscle.   She reports the doctor told her to stay in for while while she healed and she did that.  In the process she feels her legs are weaker and her mobility is not what it used to be. She tries to get out each day and walk in the store or to see a friend.  The pain is not there today.  She states it is  hard to curl hair, reaching out with her Rt arm at times.    Hand dominance: Right  PERTINENT HISTORY: RA, back pain , PMR, osteoporosis, breast cancer, CVA, blind Rt Eye Cervical fusion x 2  Lumbar surgery x 4  '11 lumbar spine surgery x 2: diskectomy, laminectomy and internal fixation at 4 levels.    Jan '14 -  redo spinal surgery by Dr. Wyline Mood, [facility]  PAIN:  Are you having pain? Yes: NPRS scale: none today at rest, 5 /10 Pain location: Rt shoulder, neck  Pain description: sore, tight Aggravating factors: reaching out and up Relieving factors: Medication, relaxing , tylenol, heating pad    PRECAUTIONS: Fall 2-3  falls this year, trips on her foot Needs UE   RED FLAGS: None   WEIGHT BEARING RESTRICTIONS: No  FALLS:  Has patient fallen in last 6 months? No  LIVING ENVIRONMENT: Lives with: lives with their family Lives in: House/apartment Stairs: No has a ramp, has 14 steps to attic and she rarely does it  Has following equipment at home: Single point cane, Environmental consultant - 4 wheeled, and Wheelchair (manual) Has a cleaning lady, every 3 weeks   OCCUPATION: retired  PLOF: Independent and Needs assistance with homemaking  PATIENT GOALS:Walk better   NEXT MD VISIT:   OBJECTIVE:  Note: Objective measures were completed at Evaluation unless otherwise noted.  DIAGNOSTIC FINDINGS:  XR done recent: Rt shoulder  Advanced degenerative changes in the right AC joint with subacromial and subclavicular spurring. Glenohumeral joint is intact. No acute bony abnormality. Specifically, no fracture, subluxation, or dislocation.   IMPRESSION: Advanced degenerative changes in the [facility] joint. No acute bony abnormality.   PATIENT SURVEYS:  FOTO 43% goal is 63%  COGNITION: Overall cognitive status: Within functional limits for tasks assessed     SENSATION: WFL  POSTURE: Forward head, thoracic kyphosis, pelvic asymmetries, scoliosis   UPPER EXTREMITY ROM:   Active ROM Right eval Left eval  Shoulder flexion 120, full PROM in supine  120  Shoulder extension    Shoulder abduction 100 deg supine PROM    Shoulder adduction    Shoulder internal rotation WNL  WNL  Shoulder external rotation FR C7 Pain with PROM in supine with abduction  FR to C7   Elbow flexion    Elbow extension    Wrist flexion    Wrist extension    Wrist ulnar deviation    Wrist radial deviation    Wrist pronation    Wrist supination    (Blank rows = not tested)  UPPER EXTREMITY MMT:  MMT Right eval Left eval  Shoulder flexion 4 4-  Shoulder extension    Shoulder abduction 4 4-  Shoulder adduction    Shoulder  internal rotation    Shoulder external rotation    Middle trapezius    Lower trapezius    Elbow flexion 5 5  Elbow extension    Wrist flexion    Wrist extension    Wrist ulnar deviation    Wrist radial deviation    Wrist pronation    Wrist supination    Grip strength (lbs)    (Blank rows = not tested) CERVICAL ROM:   Active ROM A/PROM (deg) eval  Flexion 40  Extension 15  Right lateral flexion 5  Left lateral flexion 40  Right rotation 20  Left rotation 50   (Blank rows = not tested)   SHOULDER SPECIAL TESTS: Impingement tests:  None SLAP lesions:  NT Instability tests:  NT  JOINT MOBILITY TESTING:  Rt shoulder joint GH WFL  NT cervical but limited AROM C spine   PALPATION:  Sore along occipitals and Rt upper trap, Rt levator scap    TODAY'S TREATMENT:  [facility] Adult PT Treatment:                                                DATE: 07/24/23 Therapeutic Exercise: Supine shoulder AAROM Double arm shoulder flexion hands clasped in hook lying Chest press and overhead press  Supine yellow band/red band  x 15 each external rotation Shoulder flexion  Shoulder horizontal abduction  SLR x 10 each side  Sit to stand no upper extremity support x 5, 14 seconds Cervical active range of motion seated Scapular retraction seated  Therapeutic Activity: Standing single leg support to check hip and pelvic stability  2-minute walk test MMT   HEP: See HEP - performed each exercise below   PATIENT EDUCATION: Education details: HEP, POC, posture, modifications for activity  Person educated: Patient Education method: Explanation, Demonstration, Verbal cues, and Handouts Education comprehension: verbalized understanding  HOME EXERCISE PROGRAM: Access Code: [email or web address] URL: [email or web address] Date: 07/16/2023 Prepared by:  Karie Mainland  Exercises - Supine Shoulder Horizontal Abduction with Resistance  - 1-2 x daily - 7 x weekly - 2 sets - 10 reps - 5 hold - Supine Shoulder External Rotation with Resistance  - 1-2 x daily - 7 x weekly - 2 sets - 10 reps - 5 hold - Supine Shoulder Flexion AAROM with Dowel  - 1-2 x daily - 7 x weekly - 2 sets - 10 reps - 5 hold - Seated Cervical Rotation AROM  - 1-2 x daily - 7 x weekly - 2 sets - 10 reps - 10 hold  ASSESSMENT:  CLINICAL IMPRESSION: PT certification was signed indicating OK to treat for gait and mobility, lower extremity strength.  Patient was without complaint of right sided neck and shoulder pain today.  She performed a 2-minute walk test and overall was modified independent but did show a significant right sided Trendelenburg gait lower trunk shifted to the right patient needed cueing to stand closer to her walker for safety which as she became more fatigued did decline.  She was able to perform her home program with only minimal cues and no increased pain.  She will continue to benefit from skilled physical therapy to include overall postural exercises, hip strength and general mobility.    OBJECTIVE IMPAIRMENTS: Abnormal gait, decreased balance, decreased endurance, decreased mobility, difficulty walking, decreased ROM, decreased strength, hypomobility, increased fascial restrictions, increased muscle spasms, impaired flexibility, impaired UE functional use, impaired vision/preception, postural dysfunction, and pain.   ACTIVITY LIMITATIONS: carrying, lifting, bending, standing, squatting, sleeping, stairs, transfers, dressing, reach over head, hygiene/grooming, locomotion level, and caring for others  PARTICIPATION LIMITATIONS: cleaning, laundry, shopping, community activity, and church  PERSONAL FACTORS: Age, Past/current experiences, and 3+ comorbidities: Osteoporosis, spine surgeries,visual deficits R eye   are also affecting patient's functional outcome.    REHAB POTENTIAL: Good  CLINICAL DECISION MAKING: Evolving/moderate complexity  EVALUATION COMPLEXITY: Moderate   GOALS: Goals reviewed with patient? Yes  SHORT TERM GOALS: Target date: 08/13/2023    Pt will be I with HEP for UEs , posture Baseline: given on eval  Goal status: INITIAL  2.  Pt will be screened for balance and fall risk, goal set  Baseline: NT on eval  Goal status: INITIAL  3.  Pt will be able to perform hygiene and grooming with right arm without increased pain Baseline: Pain minimal to moderate Goal status: INITIAL  LONG TERM GOALS: Target date: 09/11/2023    Pt will be I with full HEP upon discharge for UE and LEs, mobilty in general  Baseline: Unknown Goal status: INITIAL  2.  Patient will be able to use bilateral upper extremities in standing for home tasks with improved confidence and min to no pain Baseline: causes pain  Goal status: INITIAL  3.  Balance goal TBA based on results Baseline:  Goal status: INITIAL  4.  Foto score will improve to 63% or better for upper extremity function Baseline: 43% Goal status: INITIAL  5.  Mobility and gait goals to be assessed based on further testing once approved by Dr. Lovell Sheehan Baseline:  Goal status: INITIAL PLAN:   PT FREQUENCY: 2x/week  PT DURATION: 8 weeks  PLANNED INTERVENTIONS: 97164- PT Re-evaluation, 97110-Therapeutic exercises, 97530- Therapeutic activity, 97112- Neuromuscular re-education, 97535- Self Care, 29528- Manual therapy, [phone]- Gait training, Patient/Family education, Balance training, Taping, Joint mobilization, Cryotherapy, and Moist heat  PLAN FOR NEXT SESSION: Berg, lateral hip strengthening.  Continue upper back and neck treatment for posture and strength.  Consider visual cues in the parallel bars standing exercises, NuStep.  Renn Dirocco, PT 07/24/2023, 1:27 PM   Karie Mainland, PT 07/24/23 1:27 PM Phone: [phone] Fax: [phone]

## 2023-07-30 ENCOUNTER — Ambulatory Visit: Payer: Medicare Other | Attending: Neurosurgery

## 2023-07-30 DIAGNOSIS — R293 Abnormal posture: Secondary | ICD-10-CM | POA: Diagnosis not present

## 2023-07-30 DIAGNOSIS — R2689 Other abnormalities of gait and mobility: Secondary | ICD-10-CM | POA: Diagnosis not present

## 2023-07-30 DIAGNOSIS — M542 Cervicalgia: Secondary | ICD-10-CM | POA: Diagnosis not present

## 2023-07-30 NOTE — Therapy (Signed)
OUTPATIENT PHYSICAL THERAPY SHOULDER TREATMENT   Patient Name: [name] MRN: [id number] DOB:Feb 20, 1941, 82 y.o., female Today's Date: 07/30/2023  END OF SESSION:  PT End of Session - 07/30/23 1526     Visit Number 3    Number of Visits 16    Date for PT Re-Evaluation 09/10/23    Authorization Type Medicare,Tricare    PT Start Time 1415    PT Stop Time 1500    PT Time Calculation (min) 45 min    Activity Tolerance Patient tolerated treatment well    Behavior During Therapy WFL for tasks assessed/performed               Past Medical History:  Diagnosis Date   Allergic rhinitis    Anemia    Anxiety    Bronchiectasis    Carpal tunnel syndrome 07/13/2015   Bilateral   CVA (cerebral infarction) 10/2007   Right thalamic    Diverticulosis of colon    Gait disorder    GERD (gastroesophageal reflux disease)    History of breast cancer    HTN (hypertension)    Hyperlipidemia    Idiopathic pulmonary fibrosis    Left knee DJD    Migraine    Osteoporosis    Peripheral neuropathy    PMR (polymyalgia rheumatica) (HCC)    Polyneuropathy in other diseases classified elsewhere (HCC) 02/17/2013   Previous back surgery 10/09/12   Renal insufficiency    Rheumatoid arthritis (HCC)    Seizure disorder (HCC)    Spondylosis, cervical, with myelopathy 08/31/2015   C3-4 myelopathy   Temporal arteritis (HCC)    Right eye blind, on steroids per [facility]er[facility]/ Dr Anne Hahn   Type II or unspecified type diabetes mellitus without mention of complication, not stated as uncontrolled    2nd to steriods   Vitamin D deficiency    Past Surgical History:  Procedure Laterality Date   APPENDECTOMY  01/2021   CARPAL TUNNEL RELEASE Right    CATARACT EXTRACTION     OS - Summer 2010   CERVICAL FUSION  09/24/2008   4 rods and pins in place   CHOLECYSTECTOMY     COLONOSCOPY  12/15/2011   Procedure: COLONOSCOPY;  Surgeon: Charna Elizabeth, MD;  Location: [facility];  Service: Endoscopy;  Laterality:  N/A;   LUMBAR FUSION  04/24/2010   W/Mechanical fixation - L2-5 [facility]   LUMBAR FUSION  09/25/2011   rods in hips (to stabilize).   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) 08/25/2019   Pain management 10/30/2017   Rheumatoid arthritis (HCC) 04/25/2017   S/P cervical spinal fusion 09/05/2015   Spondylosis, cervical, with myelopathy 08/31/2015   Carpal tunnel syndrome 07/13/2015   Torn ear lobe 06/07/2015   Obesity (BMI 30-39.9) 04/06/2014   Shortness of breath 07/14/2013   Bone cyst 07/14/2013   Polyneuropathy  in other diseases classified elsewhere (HCC) 02/17/2013   Chronic back pain 12/09/2012   Peripheral edema 09/25/2012   Anemia due to chronic illness 09/01/2012   DDD (degenerative disc disease), lumbosacral 08/12/2012   Cervicalgia 07/11/2012   Disturbance of skin sensation 07/11/2012   Temporal arteritis (HCC) 04/01/2012   Lower GI bleed 12/24/2011   Rectal bleed 12/13/2011   Arthrodesis status 09/04/2011   Chronic renal insufficiency, stage II (mild) 04/04/2011   LUMBAR RADICULOPATHY, LEFT 04/18/2009   Essential hypertension 12/30/2007   SEIZURE DISORDER 12/30/2007   CEREBROVASCULAR ACCIDENT, HX OF 12/30/2007   Hyperlipidemia 10/08/2007   GERD 10/08/2007   DIVERTICULOSIS, COLON 10/08/2007   BRONCHIECTASIS 06/26/2007   PULMONARY FIBROSIS 05/17/2007   Osteoporosis 05/17/2007   BREAST CANCER, HX OF  05/17/2007   MASTECTOMY, LEFT, HX OF 05/17/2007    PCP: Evelena Peat MD   REFERRING PROVIDER: Tressie Stalker MD   REFERRING DIAG: Rt shoulder pain   THERAPY DIAG:  Cervicalgia  Myofascial neck pain  Other abnormalities of gait and mobility  Abnormal posture  Rationale for Evaluation and Treatment: Rehabilitation  ONSET DATE: Aug. 2024  SUBJECTIVE:                                                                                                                                                                                      SUBJECTIVE STATEMENT:  Pt reports her R neck bothers her when she turns her head to the R.   EVAL Pt referred by Dr. Lovell Sheehan.  She reports she had an onset of Rt sided neck and Rt UE pain back in Aug. The doctor gave her a shot and It is better.  She says she had to assist her sister with mobility for some time back in the summer and hse feels she may have strained a muscle.   She reports the doctor told her to stay in for while while she healed and she did that.  In the process she feels her legs are weaker and her mobility is not what it used to be. She tries to get out each day and walk in the store or to see a friend.  The pain is not there today.  She states it is  hard to curl hair, reaching out with her Rt arm at times.    Hand dominance: Right  PERTINENT HISTORY: RA, back pain , PMR, osteoporosis, breast cancer, CVA, blind Rt Eye Cervical fusion x 2  Lumbar surgery x 4  '11 lumbar spine surgery x 2: diskectomy, laminectomy and internal fixation at 4 levels.    Jan '14 - redo spinal surgery by Dr. Wyline Mood, [facility]  PAIN:  Are you having pain? Yes: NPRS scale: none today at rest, 5 /10 Pain location: Rt shoulder, neck  Pain description: sore, tight Aggravating factors: reaching out and up Relieving factors: Medication, relaxing , tylenol, heating pad    PRECAUTIONS: Fall 2-3 falls this year, trips on her foot Needs UE   RED  FLAGS: None   WEIGHT BEARING RESTRICTIONS: No  FALLS:  Has patient fallen in last 6 months? No  LIVING ENVIRONMENT: Lives with: lives with their family Lives in: House/apartment Stairs: No has a ramp, has 14 steps to attic and she rarely does it  Has following equipment at home: Single point cane, Environmental consultant - 4 wheeled, and Wheelchair (manual) Has a cleaning lady, every 3 weeks   OCCUPATION: retired  PLOF: Independent and Needs assistance with homemaking  PATIENT GOALS:Walk better   NEXT MD VISIT:   OBJECTIVE:  Note: Objective measures were completed at Evaluation unless otherwise noted.  DIAGNOSTIC FINDINGS:  XR done recent: Rt shoulder  Advanced degenerative changes in the right AC joint with subacromial and subclavicular spurring. Glenohumeral joint is intact. No acute bony abnormality. Specifically, no fracture, subluxation, or dislocation.   IMPRESSION: Advanced degenerative changes in the [facility] joint. No acute bony abnormality.   PATIENT SURVEYS:  FOTO 43% goal is 63%  COGNITION: Overall cognitive status: Within functional limits for tasks assessed     SENSATION: WFL  POSTURE: Forward head, thoracic kyphosis, pelvic asymmetries, scoliosis   UPPER EXTREMITY ROM:   Active ROM Right eval Left eval  Shoulder flexion 120, full PROM in supine  120  Shoulder extension    Shoulder abduction 100 deg supine PROM    Shoulder adduction    Shoulder internal rotation WNL  WNL  Shoulder external rotation FR C7 Pain with PROM in supine with abduction  FR to C7   Elbow flexion    Elbow extension    Wrist flexion    Wrist extension    Wrist ulnar deviation    Wrist radial deviation    Wrist pronation    Wrist supination    (Blank rows = not tested)  UPPER EXTREMITY MMT:  MMT Right eval Left eval  Shoulder flexion 4 4-  Shoulder extension    Shoulder abduction 4 4-  Shoulder adduction    Shoulder internal rotation    Shoulder external rotation    Middle  trapezius    Lower trapezius    Elbow flexion 5 5  Elbow extension    Wrist flexion    Wrist extension    Wrist ulnar deviation    Wrist radial deviation    Wrist pronation    Wrist supination    Grip strength (lbs)    (Blank rows = not tested) CERVICAL ROM:   Active ROM A/PROM (deg) eval  Flexion 40  Extension 15  Right lateral flexion 5  Left lateral flexion 40  Right rotation 20  Left rotation 50   (Blank rows = not tested)   SHOULDER SPECIAL TESTS: Impingement tests:  None SLAP lesions:  NT Instability tests:  NT  JOINT MOBILITY TESTING:  Rt shoulder joint GH WFL  NT cervical but limited AROM C spine   PALPATION:  Sore along occipitals and Rt upper trap, Rt levator scap    TODAY'S TREATMENT:  [facility] Adult PT Treatment:                                                DATE: 07/30/23 Therapeutic Exercise: Cervical retraction x10 3" DNF lift offs Chest press and overhead press c wand STS c light ball c above shoulder lifts x10 external rotation 2x10 YTB Shoulder horizontal abduction  2x10 YTB Standing side steps at counter 8'x4 Standing knee lifts at counter x10 Standing leg curls at counter x10 Manual Therapy: STM to the bilat upper traps R Lateral guides C2-C6 grades 2-3 Cervical traction  [facility] Adult PT Treatment:                                                DATE: 07/24/23 Therapeutic Exercise: Supine shoulder AAROM Double arm shoulder flexion hands clasped in hook lying Chest press and overhead press  Supine yellow band/red band  x 15 each external rotation Shoulder flexion  Shoulder horizontal abduction  SLR x 10 each side  Sit to stand no upper extremity support x 5, 14 seconds Cervical active range of motion seated Scapular retraction seated  Therapeutic Activity: Standing single leg support to check hip and  pelvic stability  2-minute walk test MMT   HEP: See HEP - performed each exercise below   PATIENT EDUCATION: Education details: HEP, POC, posture, modifications for activity  Person educated: Patient Education method: Explanation, Demonstration, Verbal cues, and Handouts Education comprehension: verbalized understanding  HOME EXERCISE PROGRAM: Access Code: [email or web address] URL: [email or web address] Date: 07/30/2023 Prepared by: Joellyn Rued  Exercises - Supine Shoulder Horizontal Abduction with Resistance  - 1-2 x daily - 7 x weekly - 2 sets - 10 reps - 5 hold - Supine Shoulder External Rotation with Resistance  - 1-2 x daily - 7 x weekly - 2 sets - 10 reps - 5 hold - Supine Shoulder Flexion AAROM with Dowel  - 1-2 x daily - 7 x weekly - 2 sets - 10 reps - 5 hold - Seated Cervical Rotation AROM  - 1-2 x daily - 7 x weekly - 2 sets - 10 reps - 10 hold - Supine Cervical Retraction with Towel  - 1-2 x daily - 7 x weekly - 1 sets - 10 reps - 3 hold - Supine DNF Liftoffs  - 1-2 x daily - 7 x weekly - 1 sets - 10 reps - 3 hold - Side Stepping with Counter Support  - 1-2 x daily - 7 x weekly - 2-3 sets - 10 reps - Sit to Stand Without Arm Support  - 1-2 x daily - 7 x weekly - 1-2 sets - 10 reps  ASSESSMENT:  CLINICAL IMPRESSION: PT was completed for manual therapy as above to address mobility and pain. The R upper trap is tender with increased muscle tightness vs  the L upper trap. Therex were then completed for postural and hip strengthening, and for balance. Pt tolerated PT today without adverse effects. Pt will continue to benefit from skilled PT to address impairments for improved neck pain and functional mobility.   OBJECTIVE IMPAIRMENTS: Abnormal gait, decreased balance, decreased endurance, decreased mobility, difficulty walking, decreased ROM, decreased strength, hypomobility, increased fascial restrictions, increased muscle spasms, impaired flexibility, impaired UE  functional  use, impaired vision/preception, postural dysfunction, and pain.   ACTIVITY LIMITATIONS: carrying, lifting, bending, standing, squatting, sleeping, stairs, transfers, dressing, reach over head, hygiene/grooming, locomotion level, and caring for others  PARTICIPATION LIMITATIONS: cleaning, laundry, shopping, community activity, and church  PERSONAL FACTORS: Age, Past/current experiences, and 3+ comorbidities: Osteoporosis, spine surgeries,visual deficits R eye   are also affecting patient's functional outcome.   REHAB POTENTIAL: Good  CLINICAL DECISION MAKING: Evolving/moderate complexity  EVALUATION COMPLEXITY: Moderate   GOALS: Goals reviewed with patient? Yes  SHORT TERM GOALS: Target date: 08/13/2023    Pt will be I with HEP for UEs , posture Baseline: given on eval  Goal status: INITIAL  2.  Pt will be screened for balance and fall risk, goal set  Baseline: NT on eval  Goal status: INITIAL  3.  Pt will be able to perform hygiene and grooming with right arm without increased pain Baseline: Pain minimal to moderate Goal status: INITIAL  LONG TERM GOALS: Target date: 09/11/2023    Pt will be I with full HEP upon discharge for UE and LEs, mobilty in general  Baseline: Unknown Goal status: INITIAL  2.  Patient will be able to use bilateral upper extremities in standing for home tasks with improved confidence and min to no pain Baseline: causes pain  Goal status: INITIAL  3.  Balance goal TBA based on results Baseline:  Goal status: INITIAL  4.  Foto score will improve to 63% or better for upper extremity function Baseline: 43% Goal status: INITIAL  5.  Mobility and gait goals to be assessed based on further testing once approved by Dr. Lovell Sheehan Baseline:  Goal status: INITIAL PLAN:   PT FREQUENCY: 2x/week  PT DURATION: 8 weeks  PLANNED INTERVENTIONS: 97164- PT Re-evaluation, 97110-Therapeutic exercises, 97530- Therapeutic activity, 97112- Neuromuscular  re-education, 97535- Self Care, 40981- Manual therapy, [id number]- Gait training, Patient/Family education, Balance training, Taping, Joint mobilization, Cryotherapy, and Moist heat  PLAN FOR NEXT SESSION: Berg, lateral hip strengthening.  Continue upper back and neck treatment for posture and strength.  Consider visual cues in the parallel bars standing exercises, NuStep.  Joellyn Rued, PT 07/30/2023, 3:42 PM

## 2023-07-31 NOTE — Therapy (Signed)
OUTPATIENT PHYSICAL THERAPY SHOULDER TREATMENT   Patient Name: [name] MRN: [id number] DOB:July 15, 1941, 82 y.o., female Today's Date: 08/01/2023  END OF SESSION:  PT End of Session - 08/01/23 1331     Visit Number 4    Number of Visits 16    Date for PT Re-Evaluation 09/10/23    Authorization Type Medicare,Tricare    PT Start Time 1330    PT Stop Time 1411    PT Time Calculation (min) 41 min    Activity Tolerance Patient tolerated treatment well    Behavior During Therapy WFL for tasks assessed/performed                Past Medical History:  Diagnosis Date   Allergic rhinitis    Anemia    Anxiety    Bronchiectasis    Carpal tunnel syndrome 07/13/2015   Bilateral   CVA (cerebral infarction) 10/2007   Right thalamic    Diverticulosis of colon    Gait disorder    GERD (gastroesophageal reflux disease)    History of breast cancer    HTN (hypertension)    Hyperlipidemia    Idiopathic pulmonary fibrosis    Left knee DJD    Migraine    Osteoporosis    Peripheral neuropathy    PMR (polymyalgia rheumatica) (HCC)    Polyneuropathy in other diseases classified elsewhere (HCC) 02/17/2013   Previous back surgery 10/09/12   Renal insufficiency    Rheumatoid arthritis (HCC)    Seizure disorder (HCC)    Spondylosis, cervical, with myelopathy 08/31/2015   C3-4 myelopathy   Temporal arteritis (HCC)    Right eye blind, on steroids per Neruro/ Dr Anne Hahn   Type II or unspecified type diabetes mellitus without mention of complication, not stated as uncontrolled    2nd to steriods   Vitamin D deficiency    Past Surgical History:  Procedure Laterality Date   APPENDECTOMY  01/2021   CARPAL TUNNEL RELEASE Right    CATARACT EXTRACTION     OS - Summer 2010   CERVICAL FUSION  09/24/2008   4 rods and pins in place   CHOLECYSTECTOMY     COLONOSCOPY  12/15/2011   Procedure: COLONOSCOPY;  Surgeon: Charna Elizabeth, MD;  Location: [facility];  Service: Endoscopy;   Laterality: N/A;   LUMBAR FUSION  04/24/2010   W/Mechanical fixation - L2-5 [facility]   LUMBAR FUSION  09/25/2011   rods in hips (to stabilize).   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) 08/25/2019   Pain management 10/30/2017   Rheumatoid arthritis (HCC) 04/25/2017   S/P cervical spinal fusion 09/05/2015   Spondylosis, cervical, with myelopathy 08/31/2015   Carpal tunnel syndrome 07/13/2015   Torn ear lobe 06/07/2015   Obesity (BMI 30-39.9) 04/06/2014   Shortness of breath 07/14/2013   Bone cyst 07/14/2013  Polyneuropathy in other diseases classified elsewhere (HCC) 02/17/2013   Chronic back pain 12/09/2012   Peripheral edema 09/25/2012   Anemia due to chronic illness 09/01/2012   DDD (degenerative disc disease), lumbosacral 08/12/2012   Cervicalgia 07/11/2012   Disturbance of skin sensation 07/11/2012   Temporal arteritis (HCC) 04/01/2012   Lower GI bleed 12/24/2011   Rectal bleed 12/13/2011   Arthrodesis status 09/04/2011   Chronic renal insufficiency, stage II (mild) 04/04/2011   LUMBAR RADICULOPATHY, LEFT 04/18/2009   Essential hypertension 12/30/2007   SEIZURE DISORDER 12/30/2007   CEREBROVASCULAR ACCIDENT, HX OF 12/30/2007   Hyperlipidemia 10/08/2007   GERD 10/08/2007   DIVERTICULOSIS, COLON 10/08/2007   BRONCHIECTASIS 06/26/2007   PULMONARY FIBROSIS 05/17/2007   Osteoporosis 05/17/2007   BREAST  CANCER, HX OF 05/17/2007   MASTECTOMY, LEFT, HX OF 05/17/2007    PCP: Evelena Peat MD   REFERRING PROVIDER: Tressie Stalker MD   REFERRING DIAG: Rt shoulder pain   THERAPY DIAG:  Cervicalgia  Myofascial neck pain  Other abnormalities of gait and mobility  Abnormal posture  Rationale for Evaluation and Treatment: Rehabilitation  ONSET DATE: Aug. 2024  SUBJECTIVE:                                                                                                                                                                                      SUBJECTIVE STATEMENT:  Pt reports her R neck pain with turning to the R is better.   EVAL: Pt referred by Dr. Lovell Sheehan.  She reports she had an onset of Rt sided neck and Rt UE pain back in Aug. The  doctor gave her a shot and It is better.  She says she had to assist her sister with mobility for some time back in the summer and hse feels she may have strained a muscle.   She reports the doctor told her to stay in for while while she healed and she did that.  In the process she feels her legs are weaker and her mobility is not what it used to be. She tries to get out each day and walk in the store or to see a friend.  The pain is not there today.  She states it is  hard to curl hair, reaching out with her Rt arm at times.    Hand dominance: Right  PERTINENT HISTORY: RA, back pain , PMR, osteoporosis, breast cancer, CVA, blind Rt Eye Cervical fusion x 2  Lumbar surgery x 4  '11 lumbar spine surgery x 2: diskectomy, laminectomy and internal fixation at 4 levels.    Jan '14 - redo spinal surgery by Dr. Wyline Mood, [facility]  PAIN:  Are you having pain? Yes: NPRS scale: none today at rest, 5 /10 Pain location: Rt shoulder, neck  Pain description: sore, tight Aggravating factors: reaching out and up Relieving factors: Medication, relaxing , tylenol, heating pad    PRECAUTIONS: Fall 2-3 falls this year, trips on her foot Needs UE    RED FLAGS: None   WEIGHT BEARING RESTRICTIONS: No  FALLS:  Has patient fallen in last 6 months? No  LIVING ENVIRONMENT: Lives with: lives with their family Lives in: House/apartment Stairs: No has a ramp, has 14 steps to attic and she rarely does it  Has following equipment at home: Single point cane, Environmental consultant - 4 wheeled, and Wheelchair (manual) Has a cleaning lady, every 3 weeks   OCCUPATION: retired  PLOF: Independent and Needs assistance with homemaking  PATIENT GOALS:Walk better   NEXT MD VISIT:   OBJECTIVE:  Note: Objective measures were completed at Evaluation unless otherwise noted.  DIAGNOSTIC FINDINGS:  XR done recent: Rt shoulder  Advanced degenerative changes in the right AC joint with subacromial and subclavicular spurring. Glenohumeral joint is intact. No acute bony abnormality. Specifically, no fracture, subluxation, or dislocation.   IMPRESSION: Advanced degenerative changes in the [facility] joint. No acute bony abnormality.   PATIENT SURVEYS:  FOTO 43% goal is 63%  COGNITION: Overall cognitive status: Within functional limits for tasks assessed     SENSATION: WFL  POSTURE: Forward head, thoracic kyphosis, pelvic asymmetries, scoliosis   UPPER EXTREMITY ROM:   Active ROM Right eval Left eval  Shoulder flexion 120, full PROM in supine  120  Shoulder extension    Shoulder abduction 100 deg supine PROM    Shoulder adduction    Shoulder internal rotation WNL  WNL  Shoulder external rotation FR C7 Pain with PROM in supine with abduction  FR to C7   Elbow flexion    Elbow extension    Wrist flexion    Wrist extension    Wrist ulnar deviation    Wrist radial deviation    Wrist pronation    Wrist supination    (Blank rows = not tested)  UPPER EXTREMITY MMT:  MMT Right eval Left eval  Shoulder flexion 4 4-  Shoulder extension    Shoulder abduction 4 4-  Shoulder adduction    Shoulder internal rotation    Shoulder external rotation     Middle trapezius    Lower trapezius    Elbow flexion 5 5  Elbow extension    Wrist flexion    Wrist extension    Wrist ulnar deviation    Wrist radial deviation    Wrist pronation    Wrist supination    Grip strength (lbs)    (Blank rows = not tested) CERVICAL ROM:   Active ROM A/PROM (deg) eval  Flexion 40  Extension 15  Right lateral flexion 5  Left lateral flexion 40  Right rotation 20  Left rotation 50   (Blank rows = not tested)   SHOULDER SPECIAL TESTS: Impingement tests:  None SLAP lesions:  NT Instability tests:  NT  JOINT MOBILITY TESTING:  Rt shoulder joint GH WFL  NT cervical but limited AROM C spine   PALPATION:  Sore along occipitals and Rt upper trap, Rt levator scap    TODAY'S TREATMENT:    [facility] Adult PT Treatment:  DATE: 08/01/23 Therapeutic Exercise: Cervical retraction x10 3" DNF lift offs x5 5" Chest press and overhead press c wand external rotation 2x10 YTB Shoulder horizontal abduction  2x10 YTB Manual Therapy: STM to the bilat upper traps R Lateral guides C2-C6 grades 2-3 Cervical traction Therpeutic Activity: BERG BALANCE  Sitting to Standing: Numbers; 0-4: 3  4. Stands without using hands and stabilize independently  3. Stands independently using hands  2. Stands using hands after multiple trials  1. Min A to stand  0. Mod-Max A to stand Standing unsupported: Numbers; 0-4: 3  4. Stands safely for 2 minutes  3. Stands 2 minutes with supervision  2. Stands 30 seconds unsupported  1. Needs several tries to stand unsupported for 30 seconds  0. Unable to stand unsupported for 30 seconds Sitting unsupported: Numbers; 0-4: 4  4. Sits for 2 minutes independently  3. Sits for 2 minutes with supervision  2. Able to sit 30 seconds  1. Able to sit 10 seconds  0. Unable to sit for 10 seconds Standing to Sitting: Numbers; 0-4: 3 4. Sits safely with minimal use of hands 3. Controls descent  with hands 2. Uses back of legs against chair to control descent 1. Sits independently, but uncontrolled descent 0. Needs assistance Transfers: Numbers; 0-4: 3  4. Transfers safely with minor use of hands  3. Transfers safely definite use of hands  2. Transfers with verbal cueing/supervision  1. Needs 1 person assist  0. Needs 2 person assist  Standing with eyes closed: Numbers; 0-4: 2  4. Stands safely for 10 seconds  3. Stands 10 seconds with supervision   2. Able to stand for 3 seconds  1. Unable to keep eyes closed for 3 seconds, but is safe  0. Needs assist to keep from falling Standing with feet together: Numbers; 0-4: 1 4. Stands for 1 minute safely 3. Stands for 1 minute with supervision 2. Unable to hold for 30 seconds  1. Needs help to attain position but can hold for 15 seconds  0. Needs help to attain position and unable to hold for 15 seconds Reaching forward with outstretched arm: Numbers; 0-4: 3  4. Reaches forward 10 inches  3. Reaches forward 5 inches  2. Reaches forward 2 inches  1. Reaches forward with supervision  0. Loses balance/requires assistace Retrieving object from the floor: Numbers; 0-4: 3 4. Able to pick up easily and safely 3. Able to pick up with supervision 2. Unable to pick up, but reaches within 1-2 inches independently 1. Unable to pick up and needs supervision 0. Unable/needs assistance to keep from falling  Turning to look behind: Numbers; 0-4: 2  4. Looks behind from both sides and weight shifts well  3. Looks behind one side only, other side less weight shift  2. Turns sideways only, maintains balance  1. Needs supervision when turning  0. Needs assistance  Turning 360 degrees: Numbers; 0-4: 0  4. Able to turn in </=4 seconds  3. Able to turn on one side in </= 4 seconds   2. Able to turn slowly, but safely  1. Needs supervision or verbal cueing  0. Needs assistance Place alternate foot on stool: Numbers; 0-4: 0 4. Completes 8  steps in 20 seconds 3. Completes 8 steps in >20 seconds 2. 4 steps without assistance/supervision 1. Completes >2 steps with minimal assist 0. Unable, needs assist to keep from falling Standing with one foot in front: Numbers; 0-4: 0  4. Independent tandem  for 30 seconds  3. Independent foot ahead for 30 seconds  2. Independent small step for 30 seconds  1. Needs help to step, but can hold for 15 seconds  0. Loses balance while standing/stepping Standing on one foot: Numbers; 0-4: 0 4. Holds >10 seconds 3. Holds 5-10 seconds 2. Holds >/=3 seconds  1. Holds <3 seconds 0. Unable  Total Score: 27/56                                                                                                                              [facility] Adult PT Treatment:                                                DATE: 07/30/23 Therapeutic Exercise: Cervical retraction x10 3" DNF lift offs Chest press and overhead press c wand STS c light ball c above shoulder lifts x10 external rotation 2x10 YTB Shoulder horizontal abduction  2x10 YTB Standing side steps at counter 8'x4 Standing knee lifts at counter x10 Standing leg curls at counter x10 Manual Therapy: STM to the bilat upper traps R Lateral guides C2-C6 grades 2-3 Cervical traction  [facility] Adult PT Treatment:                                                DATE: 07/24/23 Therapeutic Exercise: Supine shoulder AAROM Double arm shoulder flexion hands clasped in hook lying Chest press and overhead press  Supine yellow band/red band  x 15 each external rotation Shoulder flexion  Shoulder horizontal abduction  SLR x 10 each side  Sit to stand no upper extremity support x 5, 14 seconds Cervical active range of motion seated Scapular retraction seated  Therapeutic Activity: Standing single leg support to check hip and pelvic stability  2-minute walk test MMT   HEP: See HEP - performed each exercise below   PATIENT EDUCATION: Education details:  HEP, POC, posture, modifications for activity  Person educated: Patient Education method: Explanation, Demonstration, Verbal cues, and Handouts Education comprehension: verbalized understanding  HOME EXERCISE PROGRAM: Access Code: [email or web address] URL: [email or web address] Date: 07/30/2023 Prepared by: Joellyn Rued  Exercises - Supine Shoulder Horizontal Abduction with Resistance  - 1-2 x daily - 7 x weekly - 2 sets - 10 reps - 5 hold - Supine Shoulder External Rotation with Resistance  - 1-2 x daily - 7 x weekly - 2 sets - 10 reps - 5 hold - Supine Shoulder Flexion AAROM with Dowel  - 1-2 x daily - 7 x weekly - 2 sets - 10 reps - 5 hold - Seated Cervical Rotation AROM  - 1-2 x daily - 7 x weekly - 2 sets - 10 reps -  10 hold - Supine Cervical Retraction with Towel  - 1-2 x daily - 7 x weekly - 1 sets - 10 reps - 3 hold - Supine DNF Liftoffs  - 1-2 x daily - 7 x weekly - 1 sets - 10 reps - 3 hold - Side Stepping with Counter Support  - 1-2 x daily - 7 x weekly - 2-3 sets - 10 reps - Sit to Stand Without Arm Support  - 1-2 x daily - 7 x weekly - 1-2 sets - 10 reps  ASSESSMENT:  CLINICAL IMPRESSION: PT was continued for manual therapy as above to address cervical mobility and pain. Pt  reports decreased R cervical pain c R rotation today. Therex were then completed for postural strengthening. Additionally, pt's balance was assessed per Sharlene Motts. Pt scored poorly especially with single leg tasks, R more so than L. Per pt's score, use of a rollator is the appropriate assist device for her. Pt tolerated PT today without adverse effects. Pt will continue to benefit from skilled PT to address impairments for improved neck pain and functional mobility.   OBJECTIVE IMPAIRMENTS: Abnormal gait, decreased balance, decreased endurance, decreased mobility, difficulty walking, decreased ROM, decreased strength, hypomobility, increased fascial restrictions, increased muscle spasms, impaired flexibility,  impaired UE functional use, impaired vision/preception, postural dysfunction, and pain.   ACTIVITY LIMITATIONS: carrying, lifting, bending, standing, squatting, sleeping, stairs, transfers, dressing, reach over head, hygiene/grooming, locomotion level, and caring for others  PARTICIPATION LIMITATIONS: cleaning, laundry, shopping, community activity, and church  PERSONAL FACTORS: Age, Past/current experiences, and 3+ comorbidities: Osteoporosis, spine surgeries,visual deficits R eye   are also affecting patient's functional outcome.   REHAB POTENTIAL: Good  CLINICAL DECISION MAKING: Evolving/moderate complexity  EVALUATION COMPLEXITY: Moderate   GOALS: Goals reviewed with patient? Yes  SHORT TERM GOALS: Target date: 08/13/2023    Pt will be I with HEP for UEs , posture Baseline: given on eval  Goal status: Ongoing  2.  Pt will be screened for balance and fall risk, goal set  Baseline: NT on eval  Goal status: INITIAL  3.  Pt will be able to perform hygiene and grooming with right arm without increased pain Baseline: Pain minimal to moderate Goal status: INITIAL  LONG TERM GOALS: Target date: 09/11/2023    Pt will be I with full HEP upon discharge for UE and LEs, mobilty in general  Baseline: Unknown Goal status: INITIAL  2.  Patient will be able to use bilateral upper extremities in standing for home tasks with improved confidence and min to no pain Baseline: causes pain  Goal status: INITIAL  3.  Balance goal TBA based on results Baseline:  Goal status: INITIAL  4.  Foto score will improve to 63% or better for upper extremity function Baseline: 43% Goal status: INITIAL  5.  Mobility and gait goals to be assessed based on further testing once approved by Dr. Lovell Sheehan Baseline:  Goal status: INITIAL PLAN:   PT FREQUENCY: 2x/week  PT DURATION: 8 weeks  PLANNED INTERVENTIONS: 97164- PT Re-evaluation, 97110-Therapeutic exercises, 97530- Therapeutic activity,  97112- Neuromuscular re-education, 97535- Self Care, [id number]- Manual therapy, [phone]- Gait training, Patient/Family education, Balance training, Taping, Joint mobilization, Cryotherapy, and Moist heat  PLAN FOR NEXT SESSION: Berg, lateral hip strengthening.  Continue upper back and neck treatment for posture and strength.  Consider visual cues in the parallel bars standing exercises, NuStep.  Bianca Raneri MS, PT 08/01/23 8:53 PM

## 2023-08-01 ENCOUNTER — Ambulatory Visit: Payer: Medicare Other

## 2023-08-01 DIAGNOSIS — R293 Abnormal posture: Secondary | ICD-10-CM | POA: Diagnosis not present

## 2023-08-01 DIAGNOSIS — M542 Cervicalgia: Secondary | ICD-10-CM

## 2023-08-01 DIAGNOSIS — Z6822 Body mass index (BMI) 22.0-22.9, adult: Secondary | ICD-10-CM | POA: Diagnosis not present

## 2023-08-01 DIAGNOSIS — M316 Other giant cell arteritis: Secondary | ICD-10-CM | POA: Diagnosis not present

## 2023-08-01 DIAGNOSIS — J8410 Pulmonary fibrosis, unspecified: Secondary | ICD-10-CM | POA: Diagnosis not present

## 2023-08-01 DIAGNOSIS — M0589 Other rheumatoid arthritis with rheumatoid factor of multiple sites: Secondary | ICD-10-CM | POA: Diagnosis not present

## 2023-08-01 DIAGNOSIS — M810 Age-related osteoporosis without current pathological fracture: Secondary | ICD-10-CM | POA: Diagnosis not present

## 2023-08-01 DIAGNOSIS — R2689 Other abnormalities of gait and mobility: Secondary | ICD-10-CM | POA: Diagnosis not present

## 2023-08-01 DIAGNOSIS — M79645 Pain in left finger(s): Secondary | ICD-10-CM | POA: Diagnosis not present

## 2023-08-01 DIAGNOSIS — N184 Chronic kidney disease, stage 4 (severe): Secondary | ICD-10-CM | POA: Diagnosis not present

## 2023-08-01 DIAGNOSIS — M1991 Primary osteoarthritis, unspecified site: Secondary | ICD-10-CM | POA: Diagnosis not present

## 2023-08-01 DIAGNOSIS — M79672 Pain in left foot: Secondary | ICD-10-CM | POA: Diagnosis not present

## 2023-08-01 DIAGNOSIS — M5136 Other intervertebral disc degeneration, lumbar region with discogenic back pain only: Secondary | ICD-10-CM | POA: Diagnosis not present

## 2023-08-06 ENCOUNTER — Encounter: Payer: Self-pay | Admitting: Physical Therapy

## 2023-08-06 ENCOUNTER — Ambulatory Visit: Payer: Medicare Other | Admitting: Physical Therapy

## 2023-08-06 DIAGNOSIS — R293 Abnormal posture: Secondary | ICD-10-CM | POA: Diagnosis not present

## 2023-08-06 DIAGNOSIS — M542 Cervicalgia: Secondary | ICD-10-CM | POA: Diagnosis not present

## 2023-08-06 DIAGNOSIS — M810 Age-related osteoporosis without current pathological fracture: Secondary | ICD-10-CM | POA: Diagnosis not present

## 2023-08-06 DIAGNOSIS — R2689 Other abnormalities of gait and mobility: Secondary | ICD-10-CM | POA: Diagnosis not present

## 2023-08-06 NOTE — Therapy (Signed)
OUTPATIENT PHYSICAL THERAPY SHOULDER TREATMENT   Patient Name: [name] MRN: [id number] DOB:Jan 22, 1941, 82 y.o., female Today's Date: 08/06/2023  END OF SESSION:  PT End of Session - 08/06/23 1401     Visit Number 5    Number of Visits 16    Date for PT Re-Evaluation 09/10/23    Authorization Type Medicare,Tricare    PT Start Time 1401    PT Stop Time 1445    PT Time Calculation (min) 44 min                Past Medical History:  Diagnosis Date   Allergic rhinitis    Anemia    Anxiety    Bronchiectasis    Carpal tunnel syndrome 07/13/2015   Bilateral   CVA (cerebral infarction) 10/2007   Right thalamic    Diverticulosis of colon    Gait disorder    GERD (gastroesophageal reflux disease)    History of breast cancer    HTN (hypertension)    Hyperlipidemia    Idiopathic pulmonary fibrosis    Left knee DJD    Migraine    Osteoporosis    Peripheral neuropathy    PMR (polymyalgia rheumatica) (HCC)    Polyneuropathy in other diseases classified elsewhere (HCC) 02/17/2013   Previous back surgery 10/09/12   Renal insufficiency    Rheumatoid arthritis (HCC)    Seizure disorder (HCC)    Spondylosis, cervical, with myelopathy 08/31/2015   C3-4 myelopathy   Temporal arteritis (HCC)    Right eye blind, on steroids per [facility]/ Dr Anne Hahn   Type II or unspecified type diabetes mellitus without mention of complication, not stated as uncontrolled    2nd to steriods   Vitamin D deficiency    Past Surgical History:  Procedure Laterality Date   APPENDECTOMY  01/2021   CARPAL TUNNEL RELEASE Right    CATARACT EXTRACTION     OS - Summer 2010   CERVICAL FUSION  09/24/2008   4 rods and pins in place   CHOLECYSTECTOMY     COLONOSCOPY  12/15/2011   Procedure: COLONOSCOPY;  Surgeon: Charna Elizabeth, MD;  Location: [facility];  Service: Endoscopy;  Laterality: N/A;   LUMBAR FUSION  04/24/2010   W/Mechanical fixation - L2-5 [facility]   LUMBAR FUSION  09/25/2011    rods in hips (to stabilize).   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) 08/25/2019   Pain management 10/30/2017   Rheumatoid arthritis (HCC) 04/25/2017   S/P cervical spinal fusion 09/05/2015   Spondylosis, cervical, with myelopathy 08/31/2015   Carpal tunnel syndrome 07/13/2015   Torn ear lobe 06/07/2015   Obesity (BMI 30-39.9) 04/06/2014   Shortness of breath 07/14/2013   Bone cyst 07/14/2013   Polyneuropathy in other diseases classified elsewhere (HCC) 02/17/2013   Chronic back pain 12/09/2012   Peripheral edema 09/25/2012  Anemia due to chronic illness 09/01/2012   DDD (degenerative disc disease), lumbosacral 08/12/2012   Cervicalgia 07/11/2012   Disturbance of skin sensation 07/11/2012   Temporal arteritis (HCC) 04/01/2012   Lower GI bleed 12/24/2011   Rectal bleed 12/13/2011   Arthrodesis status 09/04/2011   Chronic renal insufficiency, stage II (mild) 04/04/2011   LUMBAR RADICULOPATHY, LEFT 04/18/2009   Essential hypertension 12/30/2007   SEIZURE DISORDER 12/30/2007   CEREBROVASCULAR ACCIDENT, HX OF 12/30/2007   Hyperlipidemia 10/08/2007   GERD 10/08/2007   DIVERTICULOSIS, COLON 10/08/2007   BRONCHIECTASIS 06/26/2007   PULMONARY FIBROSIS 05/17/2007   Osteoporosis 05/17/2007   BREAST CANCER, HX OF 05/17/2007   MASTECTOMY, LEFT, HX OF 05/17/2007    PCP: Evelena Peat MD   REFERRING PROVIDER: Tressie Stalker MD   REFERRING DIAG: Rt shoulder pain   THERAPY DIAG:  Cervicalgia  Myofascial neck pain  Rationale for Evaluation and Treatment: Rehabilitation  ONSET DATE: Aug. 2024  SUBJECTIVE:                                                                                                                                                                                      SUBJECTIVE STATEMENT: I did not feel good this morning. But I was feeling better yesterday. My legs are weak.     EVAL: Pt referred by Dr. Lovell Sheehan.  She reports she had an onset of Rt sided neck and Rt UE pain back in Aug. The  doctor gave her a shot and It is better.  She says she had to assist her sister with mobility for some time back in the summer and hse feels she may have strained a muscle.   She reports the doctor told her to stay in for while while she healed and she did that.  In the process she feels her legs are weaker and her mobility is not what it used to be. She tries to get out each day and walk in the store or to see a friend.  The pain is not there today.  She states it is  hard to curl hair, reaching out with her Rt arm at times.    Hand dominance: Right  PERTINENT HISTORY: RA, back pain , PMR, osteoporosis, breast cancer, CVA, blind Rt Eye Cervical fusion x 2  Lumbar surgery x 4  '11 lumbar spine surgery x 2: diskectomy, laminectomy and internal fixation at 4 levels.    Jan '14 - redo spinal surgery by Dr. Wyline Mood, [facility]  PAIN:  Are you having pain? Yes: NPRS scale: none today at rest, 5 /10 Pain location: Rt shoulder, neck  Pain description: sore, tight Aggravating factors: reaching  out and up Relieving factors: Medication, relaxing , tylenol, heating pad    PRECAUTIONS: Fall 2-3 falls this year, trips on her foot Needs UE   RED FLAGS: None   WEIGHT BEARING RESTRICTIONS: No  FALLS:  Has patient fallen in last 6 months? No  LIVING ENVIRONMENT: Lives with: lives with their  family Lives in: House/apartment Stairs: No has a ramp, has 14 steps to attic and she rarely does it  Has following equipment at home: Single point cane, Environmental consultant - 4 wheeled, and Wheelchair (manual) Has a cleaning lady, every 3 weeks   OCCUPATION: retired  PLOF: Independent and Needs assistance with homemaking  PATIENT GOALS: Walk better   NEXT MD VISIT:   OBJECTIVE:  Note: Objective measures were completed at Evaluation unless otherwise noted.  DIAGNOSTIC FINDINGS:  XR done recent: Rt shoulder  Advanced degenerative changes in the right AC joint with subacromial and subclavicular spurring. Glenohumeral joint is intact. No acute bony abnormality. Specifically, no fracture, subluxation, or dislocation.   IMPRESSION: Advanced degenerative changes in the [facility] joint. No acute bony abnormality.   PATIENT SURVEYS:  FOTO 43% goal is 63%  COGNITION: Overall cognitive status: Within functional limits for tasks assessed     SENSATION: WFL  POSTURE: Forward head, thoracic kyphosis, pelvic asymmetries, scoliosis   UPPER EXTREMITY ROM:   Active ROM Right eval Left eval Right 08/06/23  Shoulder flexion 120, full PROM in supine  120 135  Shoulder extension     Shoulder abduction 100 deg supine PROM   140   Shoulder adduction     Shoulder internal rotation WNL  WNL   Shoulder external rotation FR C7 Pain with PROM in supine with abduction  FR to C7  FR to T2  Elbow flexion     Elbow extension     Wrist flexion     Wrist extension     Wrist ulnar deviation     Wrist radial deviation     Wrist pronation     Wrist supination     (Blank rows = not tested)  UPPER EXTREMITY MMT:  MMT Right eval Left eval  Shoulder flexion 4 4-  Shoulder extension    Shoulder abduction 4 4-  Shoulder adduction    Shoulder internal rotation    Shoulder external rotation    Middle trapezius    Lower trapezius    Elbow flexion 5 5  Elbow extension    Wrist flexion    Wrist extension     Wrist ulnar deviation    Wrist radial deviation    Wrist pronation    Wrist supination    Grip strength (lbs)    (Blank rows = not tested) CERVICAL ROM:   Active ROM A/PROM (deg) eval  Flexion 40  Extension 15  Right lateral flexion 5  Left lateral flexion 40  Right rotation 20  Left rotation 50   (Blank rows = not tested)   SHOULDER SPECIAL TESTS: Impingement tests:  None SLAP lesions:  NT Instability tests:  NT  JOINT MOBILITY TESTING:  Rt shoulder joint GH WFL  NT cervical but limited AROM C spine   PALPATION:  Sore along occipitals and Rt upper trap, Rt levator scap    TODAY'S TREATMENT:    [facility] Adult PT Treatment:  DATE: 08/06/23 Therapeutic Exercise: Nustep L3 UE/LE x 5 minutes  Standing heel raises/ toe raises Side lying clam left  Seated March  Supine march Supine clam with Red band 2 x 10 Supine horiz abdct 2 x 10 red  Standing h/s curls Standing March  Updated HEP   [facility] Adult PT Treatment:                                                DATE: 08/01/23 Therapeutic Exercise: Cervical retraction x10 3" DNF lift offs x5 5" Chest press and overhead press c wand external rotation 2x10 YTB Shoulder horizontal abduction  2x10 YTB Manual Therapy: STM to the bilat upper traps R Lateral guides C2-C6 grades 2-3 Cervical traction Therpeutic Activity: BERG BALANCE 27/56                                                                                                                               [facility] Adult PT Treatment:                                                DATE: 07/30/23 Therapeutic Exercise: Cervical retraction x10 3" DNF lift offs Chest press and overhead press c wand STS c light ball c above shoulder lifts x10 external rotation 2x10 YTB Shoulder horizontal abduction  2x10 YTB Standing side steps at counter 8'x4 Standing knee lifts at counter x10 Standing leg curls at counter x10 Manual  Therapy: STM to the bilat upper traps R Lateral guides C2-C6 grades 2-3 Cervical traction  [facility] Adult PT Treatment:                                                DATE: 07/24/23 Therapeutic Exercise: Supine shoulder AAROM Double arm shoulder flexion hands clasped in hook lying Chest press and overhead press  Supine yellow band/red band  x 15 each external rotation Shoulder flexion  Shoulder horizontal abduction  SLR x 10 each side  Sit to stand no upper extremity support x 5, 14 seconds Cervical active range of motion seated Scapular retraction seated  Therapeutic Activity: Standing single leg support to check hip and pelvic stability  2-minute walk test MMT   HEP: See HEP - performed each exercise below   PATIENT EDUCATION: Education details: HEP, POC, posture, modifications for activity  Person educated: Patient Education method: Explanation, Demonstration, Verbal cues, and Handouts Education comprehension: verbalized understanding  HOME EXERCISE PROGRAM: Access Code: [email or web address] URL: [email or web address] Date: 07/30/2023 Prepared by: Joellyn Rued  Exercises - Supine Shoulder Horizontal Abduction with Resistance  -  1-2 x daily - 7 x weekly - 2 sets - 10 reps - 5 hold - Supine Shoulder External Rotation with Resistance  - 1-2 x daily - 7 x weekly - 2 sets - 10 reps - 5 hold - Supine Shoulder Flexion AAROM with Dowel  - 1-2 x daily - 7 x weekly - 2 sets - 10 reps - 5 hold - Seated Cervical Rotation AROM  - 1-2 x daily - 7 x weekly - 2 sets - 10 reps - 10 hold - Supine Cervical Retraction with Towel  - 1-2 x daily - 7 x weekly - 1 sets - 10 reps - 3 hold - Supine DNF Liftoffs  - 1-2 x daily - 7 x weekly - 1 sets - 10 reps - 3 hold - Side Stepping with Counter Support  - 1-2 x daily - 7 x weekly - 2-3 sets - 10 reps - Sit to Stand Without Arm Support  - 1-2 x daily - 7 x weekly - 1-2 sets - 10 reps  ASSESSMENT:  CLINICAL IMPRESSION: Pt reports she was having  some pain with neck rotation this morning but is doing well at this moment. She would like to work on her LE strength and balance. Began Nustep which she liked and reported feeling fatigue in her knees. Session focused on updating HEP with LE strengthening HEP.  Pt tolerated PT today without adverse effects. Pt will continue to benefit from skilled PT to address impairments for improved neck pain and functional mobility.   OBJECTIVE IMPAIRMENTS: Abnormal gait, decreased balance, decreased endurance, decreased mobility, difficulty walking, decreased ROM, decreased strength, hypomobility, increased fascial restrictions, increased muscle spasms, impaired flexibility, impaired UE functional use, impaired vision/preception, postural dysfunction, and pain.   ACTIVITY LIMITATIONS: carrying, lifting, bending, standing, squatting, sleeping, stairs, transfers, dressing, reach over head, hygiene/grooming, locomotion level, and caring for others  PARTICIPATION LIMITATIONS: cleaning, laundry, shopping, community activity, and church  PERSONAL FACTORS: Age, Past/current experiences, and 3+ comorbidities: Osteoporosis, spine surgeries,visual deficits R eye   are also affecting patient's functional outcome.   REHAB POTENTIAL: Good  CLINICAL DECISION MAKING: Evolving/moderate complexity  EVALUATION COMPLEXITY: Moderate   GOALS: Goals reviewed with patient? Yes  SHORT TERM GOALS: Target date: 08/13/2023    Pt will be I with HEP for UEs , posture Baseline: given on eval  Goal status: Ongoing  2.  Pt will be screened for balance and fall risk, goal set  Baseline: NT on eval  08/06/23: BERG 27/56 Goal status: ONGOING  3.  Pt will be able to perform hygiene and grooming with right arm without increased pain Baseline: Pain minimal to moderate Goal status: INITIAL  LONG TERM GOALS: Target date: 09/11/2023    Pt will be I with full HEP upon discharge for UE and LEs, mobilty in general  Baseline:  Unknown Goal status: INITIAL  2.  Patient will be able to use bilateral upper extremities in standing for home tasks with improved confidence and min to no pain Baseline: causes pain  Goal status: INITIAL  3.  Balance goal TBA based on results Baseline:  Goal status: INITIAL  4.  Foto score will improve to 63% or better for upper extremity function Baseline: 43% Goal status: INITIAL  5.  Mobility and gait goals to be assessed based on further testing once approved by Dr. Lovell Sheehan Baseline:  Goal status: INITIAL PLAN:   PT FREQUENCY: 2x/week  PT DURATION: 8 weeks  PLANNED INTERVENTIONS: 97164- PT Re-evaluation, 97110-Therapeutic exercises, 97530-  Therapeutic activity, [id number]- Neuromuscular re-education, [phone]- Self Care, [id number]- Manual therapy, [phone]- Gait training, Patient/Family education, Balance training, Taping, Joint mobilization, Cryotherapy, and Moist heat  PLAN FOR NEXT SESSION: Berg, lateral hip strengthening.  Continue upper back and neck treatment for posture and strength.  Consider visual cues in the parallel bars standing exercises, NuStep.  Jannette Spanner, PTA 08/06/23 3:51 PM Phone: [phone] Fax: [phone]

## 2023-08-08 ENCOUNTER — Encounter: Payer: Medicare Other | Admitting: Physical Therapy

## 2023-08-09 ENCOUNTER — Encounter: Payer: Self-pay | Admitting: Physical Therapy

## 2023-08-09 ENCOUNTER — Ambulatory Visit: Payer: Medicare Other | Admitting: Physical Therapy

## 2023-08-09 DIAGNOSIS — R293 Abnormal posture: Secondary | ICD-10-CM | POA: Diagnosis not present

## 2023-08-09 DIAGNOSIS — R2689 Other abnormalities of gait and mobility: Secondary | ICD-10-CM | POA: Diagnosis not present

## 2023-08-09 DIAGNOSIS — M542 Cervicalgia: Secondary | ICD-10-CM

## 2023-08-09 NOTE — Therapy (Signed)
OUTPATIENT PHYSICAL THERAPY SHOULDER TREATMENT   Patient Name: [name] MRN: [id number] DOB:1941/02/01, 82 y.o., female Today's Date: 08/09/2023  END OF SESSION:  PT End of Session - 08/09/23 1234     Visit Number 6    Number of Visits 16    Date for PT Re-Evaluation 09/10/23    PT Start Time 1230    PT Stop Time 1325    PT Time Calculation (min) 55 min                Past Medical History:  Diagnosis Date   Allergic rhinitis    Anemia    Anxiety    Bronchiectasis    Carpal tunnel syndrome 07/13/2015   Bilateral   CVA (cerebral infarction) 10/2007   Right thalamic    Diverticulosis of colon    Gait disorder    GERD (gastroesophageal reflux disease)    History of breast cancer    HTN (hypertension)    Hyperlipidemia    Idiopathic pulmonary fibrosis    Left knee DJD    Migraine    Osteoporosis    Peripheral neuropathy    PMR (polymyalgia rheumatica) (HCC)    Polyneuropathy in other diseases classified elsewhere (HCC) 02/17/2013   Previous back surgery 10/09/12   Renal insufficiency    Rheumatoid arthritis (HCC)    Seizure disorder (HCC)    Spondylosis, cervical, with myelopathy 08/31/2015   C3-4 myelopathy   Temporal arteritis (HCC)    Right eye blind, on steroids per Neruro/ Dr Anne Hahn   Type II or unspecified type diabetes mellitus without mention of complication, not stated as uncontrolled    2nd to steriods   Vitamin D deficiency    Past Surgical History:  Procedure Laterality Date   APPENDECTOMY  01/2021   CARPAL TUNNEL RELEASE Right    CATARACT EXTRACTION     OS - Summer 2010   CERVICAL FUSION  09/24/2008   4 rods and pins in place   CHOLECYSTECTOMY     COLONOSCOPY  12/15/2011   Procedure: COLONOSCOPY;  Surgeon: Charna Elizabeth, MD;  Location: [facility];  Service: Endoscopy;  Laterality: N/A;   LUMBAR FUSION  04/24/2010   W/Mechanical fixation - L2-5 [facility]   LUMBAR FUSION  09/25/2011   rods in hips (to stabilize).    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) 08/25/2019   Pain management 10/30/2017   Rheumatoid arthritis (HCC) 04/25/2017   S/P cervical spinal fusion 09/05/2015   Spondylosis, cervical, with myelopathy 08/31/2015   Carpal tunnel syndrome 07/13/2015   Torn ear lobe 06/07/2015   Obesity (BMI 30-39.9) 04/06/2014   Shortness of breath 07/14/2013   Bone cyst 07/14/2013   Polyneuropathy in other diseases classified elsewhere (HCC) 02/17/2013   Chronic back pain 12/09/2012   Peripheral edema 09/25/2012   Anemia due to chronic  illness 09/01/2012   DDD (degenerative disc disease), lumbosacral 08/12/2012   Cervicalgia 07/11/2012   Disturbance of skin sensation 07/11/2012   Temporal arteritis (HCC) 04/01/2012   Lower GI bleed 12/24/2011   Rectal bleed 12/13/2011   Arthrodesis status 09/04/2011   Chronic renal insufficiency, stage II (mild) 04/04/2011   LUMBAR RADICULOPATHY, LEFT 04/18/2009   Essential hypertension 12/30/2007   SEIZURE DISORDER 12/30/2007   CEREBROVASCULAR ACCIDENT, HX OF 12/30/2007   Hyperlipidemia 10/08/2007   GERD 10/08/2007   DIVERTICULOSIS, COLON 10/08/2007   BRONCHIECTASIS 06/26/2007   PULMONARY FIBROSIS 05/17/2007   Osteoporosis 05/17/2007   BREAST CANCER, HX OF 05/17/2007   MASTECTOMY, LEFT, HX OF 05/17/2007    PCP: Evelena Peat MD   REFERRING PROVIDER: Tressie Stalker MD   REFERRING DIAG: Rt  shoulder pain   THERAPY DIAG:  Cervicalgia  Rationale for Evaluation and Treatment: Rehabilitation  ONSET DATE: Aug. 2024  SUBJECTIVE:                                                                                                                                                                                      SUBJECTIVE STATEMENT: I do not feel so good today. I was active a lot the last 2 days and I am exhausted. I almost canceled because I have not worked on my exercises. My neck is also hurting today.     EVAL: Pt referred by Dr. Lovell Sheehan.  She reports she had an onset of Rt sided neck and Rt UE pain back in Aug. The  doctor gave her a shot and It is better.  She says she had to assist her sister with mobility for some time back in the summer and hse feels she may have strained a muscle.   She reports the doctor told her to stay in for while while she healed and she did that.  In the process she feels her legs are weaker and her mobility is not what it used to be. She tries to get out each day and walk in the store or to see a friend.  The pain is not there today.  She states it is  hard to curl hair, reaching out with her Rt arm at times.    Hand dominance: Right  PERTINENT HISTORY: RA, back pain , PMR, osteoporosis, breast cancer, CVA, blind Rt Eye Cervical fusion x 2  Lumbar surgery x 4  '11 lumbar spine surgery x 2: diskectomy, laminectomy and internal fixation at 4 levels.    Jan '14 - redo spinal surgery by Dr. Wyline Mood, [facility]  PAIN:  Are you having pain? Yes: NPRS scale:  5 /10 Pain location: Rt shoulder,  neck  Pain description: sore, tight Aggravating factors: reaching out and up Relieving factors: Medication, relaxing , tylenol, heating pad    PRECAUTIONS: Fall 2-3 falls this year, trips on her foot Needs UE   RED FLAGS: None   WEIGHT BEARING RESTRICTIONS: No  FALLS:  Has patient fallen in last 6 months? No  LIVING ENVIRONMENT: Lives with: lives  with their family Lives in: House/apartment Stairs: No has a ramp, has 14 steps to attic and she rarely does it  Has following equipment at home: Single point cane, Environmental consultant - 4 wheeled, and Wheelchair (manual) Has a cleaning lady, every 3 weeks   OCCUPATION: retired  PLOF: Independent and Needs assistance with homemaking  PATIENT GOALS: Walk better   NEXT MD VISIT:   OBJECTIVE:  Note: Objective measures were completed at Evaluation unless otherwise noted.  DIAGNOSTIC FINDINGS:  XR done recent: Rt shoulder  Advanced degenerative changes in the right AC joint with subacromial and subclavicular spurring. Glenohumeral joint is intact. No acute bony abnormality. Specifically, no fracture, subluxation, or dislocation.   IMPRESSION: Advanced degenerative changes in the [facility] joint. No acute bony abnormality.   PATIENT SURVEYS:  FOTO 43% goal is 63%  COGNITION: Overall cognitive status: Within functional limits for tasks assessed     SENSATION: WFL  POSTURE: Forward head, thoracic kyphosis, pelvic asymmetries, scoliosis   UPPER EXTREMITY ROM:   Active ROM Right eval Left eval Right 08/06/23  Shoulder flexion 120, full PROM in supine  120 135  Shoulder extension     Shoulder abduction 100 deg supine PROM   140   Shoulder adduction     Shoulder internal rotation WNL  WNL   Shoulder external rotation FR C7 Pain with PROM in supine with abduction  FR to C7  FR to T2  Elbow flexion     Elbow extension     Wrist flexion     Wrist extension     Wrist ulnar deviation     Wrist radial deviation     Wrist pronation     Wrist supination     (Blank rows = not tested)  UPPER EXTREMITY MMT:  MMT Right eval Left eval  Shoulder flexion 4 4-  Shoulder extension    Shoulder abduction 4 4-  Shoulder adduction    Shoulder internal rotation    Shoulder external rotation    Middle trapezius    Lower trapezius    Elbow flexion 5 5  Elbow extension    Wrist flexion     Wrist extension    Wrist ulnar deviation    Wrist radial deviation    Wrist pronation    Wrist supination    Grip strength (lbs)    (Blank rows = not tested) CERVICAL ROM:   Active ROM A/PROM (deg) eval  Flexion 40  Extension 15  Right lateral flexion 5  Left lateral flexion 40  Right rotation 20  Left rotation 50   (Blank rows = not tested)   SHOULDER SPECIAL TESTS: Impingement tests:  None SLAP lesions:  NT Instability tests:  NT  JOINT MOBILITY TESTING:  Rt shoulder joint GH WFL  NT cervical but limited AROM C spine   PALPATION:  Sore along occipitals and Rt upper trap, Rt levator scap    TODAY'S TREATMENT:    [facility] Adult PT Treatment:  DATE: 08/09/23 Therapeutic Exercise: Nustep L3 UE/LE x 5 minutes  Seated March Seated LAQ Supine red band horiz abdct  Supine yellow band diagonals  Supine cervical rotation on deflated ball Supine chin tucks on ball  Manual Therapy: TPR bilat upper traps Modalities HMP x 10 min cervical supine    [facility] Adult PT Treatment:                                                DATE: 08/06/23 Therapeutic Exercise: Nustep L3 UE/LE x 5 minutes  Standing heel raises/ toe raises Side lying clam left  Seated March  Supine march Supine clam with Red band 2 x 10 Supine horiz abdct 2 x 10 red  Standing h/s curls Standing March  Updated HEP   [facility] Adult PT Treatment:                                                DATE: 08/01/23 Therapeutic Exercise: Cervical retraction x10 3" DNF lift offs x5 5" Chest press and overhead press c wand external rotation 2x10 YTB Shoulder horizontal abduction  2x10 YTB Manual Therapy: STM to the bilat upper traps R Lateral guides C2-C6 grades 2-3 Cervical traction Therpeutic Activity: BERG BALANCE 27/56                                                                                                                               [facility] Adult PT  Treatment:                                                DATE: 07/30/23 Therapeutic Exercise: Cervical retraction x10 3" DNF lift offs Chest press and overhead press c wand STS c light ball c above shoulder lifts x10 external rotation 2x10 YTB Shoulder horizontal abduction  2x10 YTB Standing side steps at counter 8'x4 Standing knee lifts at counter x10 Standing leg curls at counter x10 Manual Therapy: STM to the bilat upper traps R Lateral guides C2-C6 grades 2-3 Cervical traction  [facility] Adult PT Treatment:                                                DATE: 07/24/23 Therapeutic Exercise: Supine shoulder AAROM Double arm shoulder flexion hands clasped in hook lying Chest press and overhead press  Supine yellow band/red band  x 15 each external rotation Shoulder flexion  Shoulder horizontal abduction  SLR  x 10 each side  Sit to stand no upper extremity support x 5, 14 seconds Cervical active range of motion seated Scapular retraction seated  Therapeutic Activity: Standing single leg support to check hip and pelvic stability  2-minute walk test MMT   HEP: See HEP - performed each exercise below   PATIENT EDUCATION: Education details: HEP, POC, posture, modifications for activity  Person educated: Patient Education method: Explanation, Demonstration, Verbal cues, and Handouts Education comprehension: verbalized understanding  HOME EXERCISE PROGRAM: Access Code: [email or web address] URL: [email or web address] Date: 07/30/2023 Prepared by: Joellyn Rued  Exercises - Supine Shoulder Horizontal Abduction with Resistance  - 1-2 x daily - 7 x weekly - 2 sets - 10 reps - 5 hold - Supine Shoulder External Rotation with Resistance  - 1-2 x daily - 7 x weekly - 2 sets - 10 reps - 5 hold - Supine Shoulder Flexion AAROM with Dowel  - 1-2 x daily - 7 x weekly - 2 sets - 10 reps - 5 hold - Seated Cervical Rotation AROM  - 1-2 x daily - 7 x weekly - 2 sets - 10 reps - 10 hold -  Supine Cervical Retraction with Towel  - 1-2 x daily - 7 x weekly - 1 sets - 10 reps - 3 hold - Supine DNF Liftoffs  - 1-2 x daily - 7 x weekly - 1 sets - 10 reps - 3 hold - Side Stepping with Counter Support  - 1-2 x daily - 7 x weekly - 2-3 sets - 10 reps - Sit to Stand Without Arm Support  - 1-2 x daily - 7 x weekly - 1-2 sets - 10 reps  ASSESSMENT:  CLINICAL IMPRESSION: Pt was not feeling well today. Focused on manual and modalities today as her neck was hurting more. She will resume working on her HEP now that her visitors have left.  Pt tolerated PT today without adverse effects. Pt will continue to benefit from skilled PT to address impairments for improved neck pain and functional mobility.   OBJECTIVE IMPAIRMENTS: Abnormal gait, decreased balance, decreased endurance, decreased mobility, difficulty walking, decreased ROM, decreased strength, hypomobility, increased fascial restrictions, increased muscle spasms, impaired flexibility, impaired UE functional use, impaired vision/preception, postural dysfunction, and pain.   ACTIVITY LIMITATIONS: carrying, lifting, bending, standing, squatting, sleeping, stairs, transfers, dressing, reach over head, hygiene/grooming, locomotion level, and caring for others  PARTICIPATION LIMITATIONS: cleaning, laundry, shopping, community activity, and church  PERSONAL FACTORS: Age, Past/current experiences, and 3+ comorbidities: Osteoporosis, spine surgeries,visual deficits R eye   are also affecting patient's functional outcome.   REHAB POTENTIAL: Good  CLINICAL DECISION MAKING: Evolving/moderate complexity  EVALUATION COMPLEXITY: Moderate   GOALS: Goals reviewed with patient? Yes  SHORT TERM GOALS: Target date: 08/13/2023    Pt will be I with HEP for UEs , posture Baseline: given on eval  Goal status: Ongoing  2.  Pt will be screened for balance and fall risk, goal set  Baseline: NT on eval  08/06/23: BERG 27/56 Goal status:  ONGOING  3.  Pt will be able to perform hygiene and grooming with right arm without increased pain Baseline: Pain minimal to moderate Goal status: INITIAL  LONG TERM GOALS: Target date: 09/11/2023    Pt will be I with full HEP upon discharge for UE and LEs, mobilty in general  Baseline: Unknown Goal status: INITIAL  2.  Patient will be able to use bilateral upper extremities in standing for home tasks with improved confidence and min to  no pain Baseline: causes pain  Goal status: INITIAL  3.  Balance goal TBA based on results Baseline:  Goal status: INITIAL  4.  Foto score will improve to 63% or better for upper extremity function Baseline: 43% Goal status: INITIAL  5.  Mobility and gait goals to be assessed based on further testing once approved by Dr. Lovell Sheehan Baseline:  Goal status: INITIAL PLAN:   PT FREQUENCY: 2x/week  PT DURATION: 8 weeks  PLANNED INTERVENTIONS: [id number]- PT Re-evaluation, 97110-Therapeutic exercises, 97530- Therapeutic activity, 97112- Neuromuscular re-education, 97535- Self Care, [id number]- Manual therapy, [phone]- Gait training, Patient/Family education, Balance training, Taping, Joint mobilization, Cryotherapy, and Moist heat  PLAN FOR NEXT SESSION: Berg, lateral hip strengthening.  Continue upper back and neck treatment for posture and strength.  Consider visual cues in the parallel bars standing exercises, NuStep.  Jannette Spanner, PTA 08/09/23 1:55 PM Phone: [phone] Fax: [phone]

## 2023-08-12 NOTE — Therapy (Signed)
OUTPATIENT PHYSICAL THERAPY SHOULDER TREATMENT   Patient Name: [name] MRN: [id number] DOB:[birth year], 82 y.o., female Today's Date: 08/13/2023  END OF SESSION:  PT End of Session - 08/13/23 1110     Visit Number 7    Number of Visits 16    Date for PT Re-Evaluation 09/10/23    Authorization Type Medicare,Tricare    PT Start Time 1105    PT Stop Time 1145    PT Time Calculation (min) 40 min    Activity Tolerance Patient tolerated treatment well    Behavior During Therapy WFL for tasks assessed/performed                 Past Medical History:  Diagnosis Date   Allergic rhinitis    Anemia    Anxiety    Bronchiectasis    Carpal tunnel syndrome 07/13/2015   Bilateral   CVA (cerebral infarction) 10/2007   Right thalamic    Diverticulosis of colon    Gait disorder    GERD (gastroesophageal reflux disease)    History of breast cancer    HTN (hypertension)    Hyperlipidemia    Idiopathic pulmonary fibrosis    Left knee DJD    Migraine    Osteoporosis    Peripheral neuropathy    PMR (polymyalgia rheumatica) (HCC)    Polyneuropathy in other diseases classified elsewhere (HCC) 02/17/2013   Previous back surgery 10/09/12   Renal insufficiency    Rheumatoid arthritis (HCC)    Seizure disorder (HCC)    Spondylosis, cervical, with myelopathy 08/31/2015   C3-4 myelopathy   Temporal arteritis (HCC)    Right eye blind, on steroids per Neruro/ Dr Anne Hahn   Type II or unspecified type diabetes mellitus without mention of complication, not stated as uncontrolled    2nd to steriods   Vitamin D deficiency    Past Surgical History:  Procedure Laterality Date   APPENDECTOMY  01/2021   CARPAL TUNNEL RELEASE Right    CATARACT EXTRACTION     OS - Summer 2010   CERVICAL FUSION  09/24/2008   4 rods and pins in place   CHOLECYSTECTOMY     COLONOSCOPY  12/15/2011   Procedure: COLONOSCOPY;  Surgeon: Charna Elizabeth, MD;  Location: [facility];  Service: Endoscopy;   Laterality: N/A;   LUMBAR FUSION  04/24/2010   W/Mechanical fixation - L2-5 [facility]   LUMBAR FUSION  09/25/2011   rods in hips (to stabilize).   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) 08/25/2019   Pain management 10/30/2017   Rheumatoid arthritis (HCC) 04/25/2017   S/P cervical spinal fusion 09/05/2015   Spondylosis, cervical, with myelopathy 08/31/2015   Carpal tunnel syndrome 07/13/2015   Torn ear lobe 06/07/2015   Obesity (BMI 30-39.9) 04/06/2014   Shortness of breath 07/14/2013   Bone cyst 07/14/2013  Polyneuropathy in other diseases classified elsewhere (HCC) 02/17/2013   Chronic back pain 12/09/2012   Peripheral edema 09/25/2012   Anemia due to chronic illness 09/01/2012   DDD (degenerative disc disease), lumbosacral 08/12/2012   Cervicalgia 07/11/2012   Disturbance of skin sensation 07/11/2012   Temporal arteritis (HCC) 04/01/2012   Lower GI bleed 12/24/2011   Rectal bleed 12/13/2011   Arthrodesis status 09/04/2011   Chronic renal insufficiency, stage II (mild) 04/04/2011   LUMBAR RADICULOPATHY, LEFT 04/18/2009   Essential hypertension 12/30/2007   SEIZURE DISORDER 12/30/2007   CEREBROVASCULAR ACCIDENT, HX OF 12/30/2007   Hyperlipidemia 10/08/2007   GERD 10/08/2007   DIVERTICULOSIS, COLON 10/08/2007   BRONCHIECTASIS 06/26/2007   PULMONARY FIBROSIS 05/17/2007   Osteoporosis 05/17/2007   BREAST  CANCER, HX OF 05/17/2007   MASTECTOMY, LEFT, HX OF 05/17/2007    PCP: Evelena Peat MD   REFERRING PROVIDER: Tressie Stalker MD   REFERRING DIAG: Rt shoulder pain   THERAPY DIAG:  Cervicalgia  Myofascial neck pain  Other abnormalities of gait and mobility  Abnormal posture  Rationale for Evaluation and Treatment: Rehabilitation  ONSET DATE: Aug. 2024  SUBJECTIVE:                                                                                                                                                                                      SUBJECTIVE STATEMENT: Pt continues to report she is feeling tired. Pt denies dizziness or having a cold. Pt states she did not sleep well last night.    EVAL: Pt referred by Dr. Lovell Sheehan.  She reports she had an onset of Rt sided neck and Rt UE pain back in Aug. The  doctor gave her a shot and It is better.  She says she had to assist her sister with mobility for some time back in the summer and hse feels she may have strained a muscle.   She reports the doctor told her to stay in for while while she healed and she did that.  In the process she feels her legs are weaker and her mobility is not what it used to be. She tries to get out each day and walk in the store or to see a friend.  The pain is not there today.  She states it is  hard to curl hair, reaching out with her Rt arm at times.    Hand dominance: Right  PERTINENT HISTORY: RA, back pain , PMR, osteoporosis, breast cancer, CVA, blind Rt Eye Cervical fusion x 2  Lumbar surgery x 4  '11 lumbar spine surgery x 2: diskectomy, laminectomy and internal fixation at 4 levels.    Jan '14 -  redo spinal surgery by Dr. Wyline Mood, [facility]  PAIN:  Are you having pain? Yes: NPRS scale:  5 /10 Pain location: Rt shoulder, neck  Pain description: sore, tight Aggravating factors: reaching out and up Relieving factors: Medication, relaxing , tylenol, heating pad    PRECAUTIONS: Fall  2-3 falls this year, trips on her foot Needs UE   RED FLAGS: None   WEIGHT BEARING RESTRICTIONS: No  FALLS:  Has patient fallen in last 6 months? No  LIVING ENVIRONMENT: Lives with: lives with their family Lives in: House/apartment Stairs: No has a ramp, has 14 steps to attic and she rarely does it  Has following equipment at home: Single point cane, Environmental consultant - 4 wheeled, and Wheelchair (manual) Has a cleaning lady, every 3 weeks   OCCUPATION: retired  PLOF: Independent and Needs assistance with homemaking  PATIENT GOALS: Walk better   NEXT MD VISIT:   OBJECTIVE:  Note: Objective measures were completed at Evaluation unless otherwise noted.  DIAGNOSTIC FINDINGS:  XR done recent: Rt shoulder  Advanced degenerative changes in the right AC joint with subacromial and subclavicular spurring. Glenohumeral joint is intact. No acute bony abnormality. Specifically, no fracture, subluxation, or dislocation.   IMPRESSION: Advanced degenerative changes in the [facility] joint. No acute bony abnormality.   PATIENT SURVEYS:  FOTO 43% goal is 63%  COGNITION: Overall cognitive status: Within functional limits for tasks assessed     SENSATION: WFL  POSTURE: Forward head, thoracic kyphosis, pelvic asymmetries, scoliosis   UPPER EXTREMITY ROM:   Active ROM Right eval Left eval Right 08/06/23  Shoulder flexion 120, full PROM in supine  120 135  Shoulder extension     Shoulder abduction 100 deg supine PROM   140   Shoulder adduction     Shoulder internal rotation WNL  WNL   Shoulder external rotation FR C7 Pain with PROM in supine with abduction  FR to C7  FR to T2  Elbow flexion     Elbow extension     Wrist flexion     Wrist extension     Wrist ulnar deviation     Wrist radial deviation     Wrist pronation     Wrist supination     (Blank rows = not tested)  UPPER EXTREMITY MMT:  MMT Right eval Left eval  Shoulder flexion 4 4-  Shoulder extension    Shoulder  abduction 4 4-  Shoulder adduction    Shoulder internal rotation    Shoulder external rotation    Middle trapezius    Lower trapezius    Elbow flexion 5 5  Elbow extension    Wrist flexion    Wrist extension    Wrist ulnar deviation    Wrist radial deviation    Wrist pronation    Wrist supination    Grip strength (lbs)    (Blank rows = not tested) CERVICAL ROM:   Active ROM A/PROM (deg) eval  Flexion 40  Extension 15  Right lateral flexion 5  Left lateral flexion 40  Right rotation 20  Left rotation 50   (Blank rows = not tested)   SHOULDER SPECIAL TESTS: Impingement tests:  None SLAP lesions:  NT Instability tests:  NT  JOINT MOBILITY TESTING:  Rt shoulder joint GH WFL  NT cervical but limited AROM C spine   PALPATION:  Sore along occipitals and Rt upper trap, Rt levator scap    TODAY'S TREATMENT:   [facility] Adult PT Treatment:  DATE: 08/13/23 Therapeutic Exercise: Nustep L3 UE/LE x 5 minutes  Standing heel raises/ toe raises 2x10 Standing March x10 each Standing h/s curls x10 each Side stepping at counter 10' x4 LAQ 2x10 3# Supine clam with Red band 2 x 10 both; x10 SL Bridge, not completed with pt experiencing hamstring spasms STS from mat table c airex mat 2x10   [facility] Adult PT Treatment:                                                DATE: 08/09/23 Therapeutic Exercise: Nustep L3 UE/LE x 5 minutes  Seated March Seated LAQ Supine red band horiz abdct  Supine yellow band diagonals  Supine cervical rotation on deflated ball Supine chin tucks on ball  Manual Therapy: TPR bilat upper traps Modalities HMP x 10 min cervical supine  [facility] Adult PT Treatment:                                                DATE: 08/06/23 Therapeutic Exercise: Nustep L3 UE/LE x 5 minutes  Standing heel raises/ toe raises Side lying clam left  Seated March  Supine march Supine clam with Red band 2 x 10 Supine horiz abdct 2 x  10 red  Standing h/s curls Standing March  Updated HEP   [facility] Adult PT Treatment:                                                DATE: 08/01/23 Therapeutic Exercise: Cervical retraction x10 3" DNF lift offs x5 5" Chest press and overhead press c wand external rotation 2x10 YTB Shoulder horizontal abduction  2x10 YTB Manual Therapy: STM to the bilat upper traps R Lateral guides C2-C6 grades 2-3 Cervical traction Therpeutic Activity: BERG BALANCE 27/56   HEP: See HEP - performed each exercise below   PATIENT EDUCATION: Education details: HEP, POC, posture, modifications for activity  Person educated: Patient Education method: Explanation, Demonstration, Verbal cues, and Handouts Education comprehension: verbalized understanding  HOME EXERCISE PROGRAM: Access Code: [email or web address] URL: [email or web address] Date: 07/30/2023 Prepared by: Joellyn Rued  Exercises - Supine Shoulder Horizontal Abduction with Resistance  - 1-2 x daily - 7 x weekly - 2 sets - 10 reps - 5 hold - Supine Shoulder External Rotation with Resistance  - 1-2 x daily - 7 x weekly - 2 sets - 10 reps - 5 hold - Supine Shoulder Flexion AAROM with Dowel  - 1-2 x daily - 7 x weekly - 2 sets - 10 reps - 5 hold - Seated Cervical Rotation AROM  - 1-2 x daily - 7 x weekly - 2 sets - 10 reps - 10 hold - Supine Cervical Retraction with Towel  - 1-2 x daily - 7 x weekly - 1 sets - 10 reps - 3 hold - Supine DNF Liftoffs  - 1-2 x daily - 7 x weekly - 1 sets - 10 reps - 3 hold - Side Stepping with Counter Support  - 1-2 x daily - 7 x weekly - 2-3 sets - 10 reps - Sit to Stand Without Arm Support  -  1-2 x daily - 7 x weekly - 1-2 sets - 10 reps  ASSESSMENT:  CLINICAL IMPRESSION: Pt's R hip abd is very weak with the pt not able to complete this motion against gravity. The weaknes of the R hip abd along with her postural L lateral shift of her upper body are the primary reasons for her balance issues. Pt demonstrates  a Trendelenburg gait pattern over her R LE even with use of a RW. Will assess remaining STGs and LTGs the next PT session.  Pt tolerated PT today without adverse effects. Pt will continue to benefit from skilled PT to address impairments for improved neck pain and functional mobility.   OBJECTIVE IMPAIRMENTS: Abnormal gait, decreased balance, decreased endurance, decreased mobility, difficulty walking, decreased ROM, decreased strength, hypomobility, increased fascial restrictions, increased muscle spasms, impaired flexibility, impaired UE functional use, impaired vision/preception, postural dysfunction, and pain.   ACTIVITY LIMITATIONS: carrying, lifting, bending, standing, squatting, sleeping, stairs, transfers, dressing, reach over head, hygiene/grooming, locomotion level, and caring for others  PARTICIPATION LIMITATIONS: cleaning, laundry, shopping, community activity, and church  PERSONAL FACTORS: Age, Past/current experiences, and 3+ comorbidities: Osteoporosis, spine surgeries,visual deficits R eye   are also affecting patient's functional outcome.   REHAB POTENTIAL: Good  CLINICAL DECISION MAKING: Evolving/moderate complexity  EVALUATION COMPLEXITY: Moderate   GOALS: Goals reviewed with patient? Yes  SHORT TERM GOALS: Target date: 08/13/2023    Pt will be I with HEP for UEs , posture Baseline: given on eval  Goal status: Ongoing  2.  Pt will be screened for balance and fall risk, goal set  Baseline: NT on eval  08/06/23: BERG 27/56 Goal status: MET  3.  Pt will be able to perform hygiene and grooming with right arm without increased pain Baseline: Pain minimal to moderate Goal status: INITIAL  LONG TERM GOALS: Target date: 09/11/2023    Pt will be I with full HEP upon discharge for UE and LEs, mobilty in general  Baseline: Unknown Goal status: INITIAL  2.  Patient will be able to use bilateral upper extremities in standing for home tasks with improved confidence  and min to no pain Baseline: causes pain  Goal status: INITIAL  3.  Balance goal TBA based on results Baseline:  Goal status: INITIAL  4.  Foto score will improve to 63% or better for upper extremity function Baseline: 43% Goal status: INITIAL  5.  Mobility and gait goals to be assessed based on further testing once approved by Dr. Lovell Sheehan Baseline:  Goal status: INITIAL PLAN:   PT FREQUENCY: 2x/week  PT DURATION: 8 weeks  PLANNED INTERVENTIONS: [id number]- PT Re-evaluation, 97110-Therapeutic exercises, 97530- Therapeutic activity, 97112- Neuromuscular re-education, 97535- Self Care, [id number]- Manual therapy, [phone]- Gait training, Patient/Family education, Balance training, Taping, Joint mobilization, Cryotherapy, and Moist heat  PLAN FOR NEXT SESSION: Berg, lateral hip strengthening.  Continue upper back and neck treatment for posture and strength.  Consider visual cues in the parallel bars standing exercises, NuStep.  Chimamanda Siegfried MS, PT 08/13/23 1:26 PM

## 2023-08-13 ENCOUNTER — Ambulatory Visit: Payer: Medicare Other

## 2023-08-13 DIAGNOSIS — R293 Abnormal posture: Secondary | ICD-10-CM

## 2023-08-13 DIAGNOSIS — R2689 Other abnormalities of gait and mobility: Secondary | ICD-10-CM

## 2023-08-13 DIAGNOSIS — M542 Cervicalgia: Secondary | ICD-10-CM | POA: Diagnosis not present

## 2023-08-14 NOTE — Therapy (Signed)
OUTPATIENT PHYSICAL THERAPY SHOULDER/BALANCE TREATMENT   Patient Name: [name] MRN: [id number] DOB:[birth year], 82 y.o., female Today's Date: 08/15/2023  END OF SESSION:  PT End of Session - 08/15/23 1104     Visit Number 8    Number of Visits 16    Date for PT Re-Evaluation 09/10/23    Authorization Type Medicare,Tricare    PT Start Time 1103    PT Stop Time 1145    PT Time Calculation (min) 42 min    Activity Tolerance Patient tolerated treatment well    Behavior During Therapy WFL for tasks assessed/performed                  Past Medical History:  Diagnosis Date   Allergic rhinitis    Anemia    Anxiety    Bronchiectasis    Carpal tunnel syndrome 07/13/2015   Bilateral   CVA (cerebral infarction) 10/2007   Right thalamic    Diverticulosis of colon    Gait disorder    GERD (gastroesophageal reflux disease)    History of breast cancer    HTN (hypertension)    Hyperlipidemia    Idiopathic pulmonary fibrosis    Left knee DJD    Migraine    Osteoporosis    Peripheral neuropathy    PMR (polymyalgia rheumatica) (HCC)    Polyneuropathy in other diseases classified elsewhere (HCC) 02/17/2013   Previous back surgery 10/09/12   Renal insufficiency    Rheumatoid arthritis (HCC)    Seizure disorder (HCC)    Spondylosis, cervical, with myelopathy 08/31/2015   C3-4 myelopathy   Temporal arteritis (HCC)    Right eye blind, on steroids per Neruro/ Dr Anne Hahn   Type II or unspecified type diabetes mellitus without mention of complication, not stated as uncontrolled    2nd to steriods   Vitamin D deficiency    Past Surgical History:  Procedure Laterality Date   APPENDECTOMY  01/2021   CARPAL TUNNEL RELEASE Right    CATARACT EXTRACTION     OS - Summer 2010   CERVICAL FUSION  09/24/2008   4 rods and pins in place   CHOLECYSTECTOMY     COLONOSCOPY  12/15/2011   Procedure: COLONOSCOPY;  Surgeon: Charna Elizabeth, MD;  Location: [facility];  Service:  Endoscopy;  Laterality: N/A;   LUMBAR FUSION  04/24/2010   W/Mechanical fixation - L2-5 [facility]   LUMBAR FUSION  09/25/2011   rods in hips (to stabilize).   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   Pain management 10/30/2017   Rheumatoid arthritis (HCC) 04/25/2017   S/P cervical spinal fusion 09/05/2015   Spondylosis, cervical, with myelopathy 08/31/2015   Carpal tunnel syndrome 07/13/2015   Torn ear lobe 06/07/2015   Obesity (BMI 30-39.9) 04/06/2014   Shortness of breath 07/14/2013   Bone cyst 07/14/2013  Polyneuropathy in other diseases classified elsewhere (HCC) 02/17/2013   Chronic back pain 12/09/2012   Peripheral edema 09/25/2012   Anemia due to chronic illness 09/01/2012   DDD (degenerative disc disease), lumbosacral 08/12/2012   Cervicalgia 07/11/2012   Disturbance of skin sensation 07/11/2012   Temporal arteritis (HCC) 04/01/2012   Lower GI bleed 12/24/2011   Rectal bleed 12/13/2011   Arthrodesis status 09/04/2011   Chronic renal insufficiency, stage II (mild) 04/04/2011   LUMBAR RADICULOPATHY, LEFT 04/18/2009   Essential hypertension 12/30/2007   SEIZURE DISORDER 12/30/2007   CEREBROVASCULAR ACCIDENT, HX OF 12/30/2007   Hyperlipidemia 10/08/2007   GERD 10/08/2007   DIVERTICULOSIS, COLON 10/08/2007   BRONCHIECTASIS 06/26/2007   PULMONARY FIBROSIS 05/17/2007   Osteoporosis 05/17/2007    BREAST CANCER, HX OF 05/17/2007   MASTECTOMY, LEFT, HX OF 05/17/2007    PCP: Evelena Peat MD   REFERRING PROVIDER: Tressie Stalker MD   REFERRING DIAG: Rt shoulder pain   THERAPY DIAG:  Cervicalgia  Myofascial neck pain  Other abnormalities of gait and mobility  Abnormal posture  Rationale for Evaluation and Treatment: Rehabilitation  ONSET DATE: Aug. 2024  SUBJECTIVE:                                                                                                                                                                                      SUBJECTIVE STATEMENT: Pt reports the next month she going to be very busy and she would like to stop PT in the clinic at this time and work on her HEP.    EVAL: Pt referred by Dr. Lovell Sheehan.  She reports she had an onset of Rt sided neck and Rt UE pain back in Aug. The  doctor gave her a shot and It is better.  She says she had to assist her sister with mobility for some time back in the summer and hse feels she may have strained a muscle.   She reports the doctor told her to stay in for while while she healed and she did that.  In the process she feels her legs are weaker and her mobility is not what it used to be. She tries to get out each day and walk in the store or to see a friend.  The pain is not there today.  She states it is  hard to curl hair, reaching out with her Rt arm at times.    Hand dominance: Right  PERTINENT HISTORY: RA, back pain , PMR, osteoporosis, breast cancer, CVA, blind Rt Eye Cervical fusion x 2  Lumbar surgery x 4  '11 lumbar spine surgery x 2: diskectomy, laminectomy and internal fixation at 4 levels.  Jan '14 - redo spinal surgery by Dr. Wyline Mood, [facility]  PAIN:  Are you having pain? Yes: NPRS scale:  0 /10 if not to the end range of motions. 6/10 at end range of R rotation and flexion Pain location: Rt shoulder, neck  Pain description: sore, tight Aggravating factors: reaching out and  up Relieving factors: Medication, relaxing , tylenol, heating pad    PRECAUTIONS: Fall 2-3 falls this year, trips on her foot Needs UE   RED FLAGS: None   WEIGHT BEARING RESTRICTIONS: No  FALLS:  Has patient fallen in last 6 months? No  LIVING ENVIRONMENT: Lives with: lives with their family Lives in: House/apartment Stairs: No has a ramp, has 14 steps to attic and she rarely does it  Has following equipment at home: Single point cane, Environmental consultant - 4 wheeled, and Wheelchair (manual) Has a cleaning lady, every 3 weeks   OCCUPATION: retired  PLOF: Independent and Needs assistance with homemaking  PATIENT GOALS: Walk better   NEXT MD VISIT:   OBJECTIVE:  Note: Objective measures were completed at Evaluation unless otherwise noted.  DIAGNOSTIC FINDINGS:  XR done recent: Rt shoulder  Advanced degenerative changes in the right AC joint with subacromial and subclavicular spurring. Glenohumeral joint is intact. No acute bony abnormality. Specifically, no fracture, subluxation, or dislocation.   IMPRESSION: Advanced degenerative changes in the [facility] joint. No acute bony abnormality.   PATIENT SURVEYS:  FOTO 43% goal is 63%  COGNITION: Overall cognitive status: Within functional limits for tasks assessed     SENSATION: WFL  POSTURE: Forward head, thoracic kyphosis, pelvic asymmetries, scoliosis   UPPER EXTREMITY ROM:   Active ROM Right eval Left eval Right 08/06/23  Shoulder flexion 120, full PROM in supine  120 135  Shoulder extension     Shoulder abduction 100 deg supine PROM   140   Shoulder adduction     Shoulder internal rotation WNL  WNL   Shoulder external rotation FR C7 Pain with PROM in supine with abduction  FR to C7  FR to T2  Elbow flexion     Elbow extension     Wrist flexion     Wrist extension     Wrist ulnar deviation     Wrist radial deviation     Wrist pronation     Wrist supination     (Blank rows = not tested)  UPPER EXTREMITY  MMT:  MMT Right eval Left eval  Shoulder flexion 4 4-  Shoulder extension    Shoulder abduction 4 4-  Shoulder adduction    Shoulder internal rotation    Shoulder external rotation    Middle trapezius    Lower trapezius    Elbow flexion 5 5  Elbow extension    Wrist flexion    Wrist extension    Wrist ulnar deviation    Wrist radial deviation    Wrist pronation    Wrist supination    Grip strength (lbs)    (Blank rows = not tested) CERVICAL ROM:   Active ROM A/PROM (deg) eval  Flexion 40  Extension 15  Right lateral flexion 5  Left lateral flexion 40  Right rotation 20  Left rotation 50   (Blank rows = not tested)   SHOULDER SPECIAL TESTS: Impingement tests:  None SLAP lesions:  NT Instability tests:  NT  JOINT MOBILITY TESTING:  Rt shoulder joint GH WFL  NT cervical but limited AROM C spine   PALPATION:  Sore along occipitals and Rt  upper trap, Rt levator scap    TODAY'S TREATMENT: [facility] Adult PT Treatment:                                                DATE: 08/15/23 Therapeutic Exercise: Standing heel raises/ toe raises 2x10 Standing March x10 each Standing h/s curls x10 each Side stepping at counter 10' x4 STS from mat table c airex mat 2x10 Supine chin tuck x10 3" Supine DNF neck lift x5 5" Supine sh horz abd x15 3" Supine sh ER x15 3" Supine cervical rotation Final HEP    [facility] Adult PT Treatment:                                                DATE: 08/13/23 Therapeutic Exercise: Nustep L3 UE/LE x 5 minutes  Standing heel raises/ toe raises 2x10 Standing March x10 each Standing h/s curls x10 each Side stepping at counter 10' x4 LAQ 2x10 3# Supine clam with Red band 2 x 10 both; x10 SL Bridge, not completed with pt experiencing hamstring spasms STS from mat table c airex mat 2x10   [facility] Adult PT Treatment:                                                DATE: 08/09/23 Therapeutic Exercise: Nustep L3 UE/LE x 5 minutes  Seated March Seated  LAQ Supine red band horiz abdct  Supine yellow band diagonals  Supine cervical rotation on deflated ball Supine chin tucks on ball  Manual Therapy: TPR bilat upper traps Modalities HMP x 10 min cervical supine  [facility] Adult PT Treatment:                                                DATE: 08/06/23 Therapeutic Exercise: Nustep L3 UE/LE x 5 minutes  Standing heel raises/ toe raises Side lying clam left  Seated March  Supine march Supine clam with Red band 2 x 10 Supine horiz abdct 2 x 10 red  Standing h/s curls Standing March  Updated HEP  PATIENT EDUCATION: Education details: HEP, POC, posture, modifications for activity  Person educated: Patient Education method: Explanation, Demonstration, Verbal cues, and Handouts Education comprehension: verbalized understanding  HOME EXERCISE PROGRAM: Access Code: [email or web address] URL: [email or web address] Date: 08/15/2023 Prepared by: Joellyn Rued  Exercises - Supine Cervical Retraction with Towel  - 1-2 x daily - 7 x weekly - 1 sets - 10 reps - 3 hold - Supine DNF Liftoffs  - 1-2 x daily - 7 x weekly - 1 sets - 10 reps - 3 hold - Supine Shoulder Horizontal Abduction with Resistance  - 1 x daily - 7 x weekly - 1 sets - 15 reps - 3 hold - Supine Shoulder External Rotation with Resistance  - 1 x daily - 7 x weekly - 1 sets - 15 reps - 3 hold - Supine Shoulder Flexion AAROM with Dowel  - 1 x daily - 7 x weekly -  1 sets - 15 reps - 3 hold - Seated Cervical Rotation AROM  - 1 x daily - 7 x weekly - 1 sets - 5 reps - 10 hold - Side Stepping with Counter Support  - 1 x daily - 7 x weekly - 1 sets - 10 reps - Sit to Stand Without Arm Support  - 1 x daily - 7 x weekly - 1 sets - 10 reps - Standing March with Counter Support  - 1 x daily - 7 x weekly - 1 sets - 10 reps - Heel Raises with Counter Support  - 1 x daily - 7 x weekly - 1 sets - 10 reps - Standing Hamstring Curl with Chair Support  - 1 x daily - 7 x weekly - 1 sets - 10  reps  ASSESSMENT:  CLINICAL IMPRESSION: Pt requested to finish her PT POC today due to her upcoming busy schedule. Pt has progressed re: a decrease in her R arm pain with its functional use. PT today was completed for cervical mobility and postural strengthening, for LE strengthening, and for balance. A final HEP was reviewed and completed, with pt demonstrating proper return of the therex. Due to postural imbalances and weak R hip abductors, pt continues to need assist from her rollator for safety with functional mobility. Pt states she will request additional PT in the future if she feels she is not improving with her mobility.   OBJECTIVE IMPAIRMENTS: Abnormal gait, decreased balance, decreased endurance, decreased mobility, difficulty walking, decreased ROM, decreased strength, hypomobility, increased fascial restrictions, increased muscle spasms, impaired flexibility, impaired UE functional use, impaired vision/preception, postural dysfunction, and pain.   ACTIVITY LIMITATIONS: carrying, lifting, bending, standing, squatting, sleeping, stairs, transfers, dressing, reach over head, hygiene/grooming, locomotion level, and caring for others  PARTICIPATION LIMITATIONS: cleaning, laundry, shopping, community activity, and church  PERSONAL FACTORS: Age, Past/current experiences, and 3+ comorbidities: Osteoporosis, spine surgeries,visual deficits R eye   are also affecting patient's functional outcome.   REHAB POTENTIAL: Good  CLINICAL DECISION MAKING: Evolving/moderate complexity  EVALUATION COMPLEXITY: Moderate   GOALS: Goals reviewed with patient? Yes  SHORT TERM GOALS: Target date: 08/13/2023    Pt will be I with HEP for UEs , posture Baseline: given on eval  Goal status: Ongoing  2.  Pt will be screened for balance and fall risk, goal set  Baseline: NT on eval  08/06/23: BERG 27/56 Goal status: MET  3.  Pt will be able to perform hygiene and grooming with right arm without  increased pain Baseline: Pain minimal to moderate 08/15/23: Pt reports no R arm pain with completing grooming or hyiene Goal status: MET  LONG TERM GOALS: Target date: 09/11/2023    Pt will be I with full HEP upon discharge for UE and LEs, mobilty in general  Baseline: Unknown Goal status: MET 08/15/23  2.  Patient will be able to use bilateral upper extremities in standing for home tasks with improved confidence and min to no pain Baseline: causes pain  08/15/23: pt denies UE pain with home tasks Goal status: MET  3.  Balance goal TBA based on results Baseline:  Goal status: BERG reassessment was deferred, due to postural imbalances and weak R hip abductors, pt continues to need assist from her rollator for safety with functional mobility. Goal Status: deferred  4.  Foto score will improve to 63% or better for upper extremity function Baseline: 43% Goal status: Deferred due to shortened POC  5.  Mobility and gait  goals to be assessed based on further testing once approved by Dr. Lovell Sheehan Baseline:  Goal status:NA PLAN:   PT FREQUENCY: 2x/week  PT DURATION: 8 weeks  PLANNED INTERVENTIONS: 97164- PT Re-evaluation, 97110-Therapeutic exercises, 97530- Therapeutic activity, 97112- Neuromuscular re-education, 97535- Self Care, [id number]- Manual therapy, [phone]- Gait training, Patient/Family education, Balance training, Taping, Joint mobilization, Cryotherapy, and Moist heat  PLAN FOR NEXT SESSION: Berg, lateral hip strengthening.  Continue upper back and neck treatment for posture and strength.  Consider visual cues in the parallel bars standing exercises, NuStep.  PHYSICAL THERAPY DISCHARGE SUMMARY  Visits from Start of Care: 8  Current functional level related to goals / functional outcomes: See clinical impression and PT goals    Remaining deficits: See clinical impression and PT goals    Education / Equipment: HEP; Pt Ed   Patient agrees to discharge. Patient goals were  partially met. Patient is being discharged due to the patient's request.   Joellyn Rued MS, PT 08/15/23 1:31 PM

## 2023-08-15 ENCOUNTER — Ambulatory Visit: Payer: Medicare Other

## 2023-08-15 DIAGNOSIS — M542 Cervicalgia: Secondary | ICD-10-CM

## 2023-08-15 DIAGNOSIS — R2689 Other abnormalities of gait and mobility: Secondary | ICD-10-CM

## 2023-08-15 DIAGNOSIS — R293 Abnormal posture: Secondary | ICD-10-CM | POA: Diagnosis not present

## 2023-08-19 DIAGNOSIS — M0589 Other rheumatoid arthritis with rheumatoid factor of multiple sites: Secondary | ICD-10-CM | POA: Diagnosis not present

## 2023-08-21 ENCOUNTER — Other Ambulatory Visit: Payer: Self-pay | Admitting: Family Medicine

## 2023-09-02 ENCOUNTER — Other Ambulatory Visit: Payer: Self-pay

## 2023-09-02 ENCOUNTER — Emergency Department (HOSPITAL_COMMUNITY): Payer: Medicare Other

## 2023-09-02 ENCOUNTER — Encounter (HOSPITAL_COMMUNITY): Payer: Self-pay

## 2023-09-02 ENCOUNTER — Emergency Department (HOSPITAL_COMMUNITY)
Admission: EM | Admit: 2023-09-02 | Discharge: 2023-09-02 | Disposition: A | Discharge status: Home / Self Care | Payer: Medicare Other | Attending: Emergency Medicine | Admitting: Emergency Medicine

## 2023-09-02 DIAGNOSIS — W19XXXA Unspecified fall, initial encounter: Secondary | ICD-10-CM | POA: Diagnosis not present

## 2023-09-02 DIAGNOSIS — Y9301 Activity, walking, marching and hiking: Secondary | ICD-10-CM | POA: Diagnosis not present

## 2023-09-02 DIAGNOSIS — W010XXA Fall on same level from slipping, tripping and stumbling without subsequent striking against object, initial encounter: Secondary | ICD-10-CM | POA: Diagnosis not present

## 2023-09-02 DIAGNOSIS — S81812A Laceration without foreign body, left lower leg, initial encounter: Secondary | ICD-10-CM | POA: Diagnosis not present

## 2023-09-02 DIAGNOSIS — R58 Hemorrhage, not elsewhere classified: Secondary | ICD-10-CM | POA: Diagnosis not present

## 2023-09-02 DIAGNOSIS — M79605 Pain in left leg: Secondary | ICD-10-CM

## 2023-09-02 DIAGNOSIS — Z96652 Presence of left artificial knee joint: Secondary | ICD-10-CM | POA: Diagnosis not present

## 2023-09-02 DIAGNOSIS — E119 Type 2 diabetes mellitus without complications: Secondary | ICD-10-CM | POA: Insufficient documentation

## 2023-09-02 DIAGNOSIS — M25562 Pain in left knee: Secondary | ICD-10-CM | POA: Diagnosis not present

## 2023-09-02 MED ORDER — ACETAMINOPHEN 500 MG PO TABS
1000.0000 mg | ORAL_TABLET | Freq: Once | ORAL | Status: AC
  Administered 2023-09-02: 1000 mg via ORAL
  Filled 2023-09-02: qty 2

## 2023-09-02 MED ORDER — LIDOCAINE HCL (PF) 1 % IJ SOLN
10.0000 mL | Freq: Once | INTRAMUSCULAR | Status: DC
  Filled 2023-09-02: qty 30

## 2023-09-02 NOTE — ED Notes (Signed)
Pt discharged home. Discharge information discussed. No s/s of distress observed during discharge. 

## 2023-09-02 NOTE — Discharge Instructions (Signed)
Unfortunately you fell today and got a laceration.  Thankfully we were able to suture this.  The sutures should be taken out in 10 to 14 days, and you should follow-up with your primary care doctor in regards to this.  If your skin becomes red, swollen, or tender to the touch, please return to the ER follow-up with your PCP, as this means it may be getting infected.  Do not get the sutures wet for the first 24 hours, then afterwards you can have water rinse over them.  The Steri-Strips are in place, to help with your wound, and will fall off on their own.  Return if you have worsening symptoms

## 2023-09-02 NOTE — ED Provider Notes (Signed)
[facility] AT [facility] Provider Note   CSN: [id number] Arrival date & time: 09/02/23  1004     History  Chief Complaint  Patient presents with   [name] is a 82 y.o. female, history of type 2 diabetes, CVA, who presents to the ED secondary to a mechanical fall that occurred about an hour ago.  She states that she [place] from the post [place], [place] her Christmas cards, and she actually slipped on a puddle, and fell while using her walker.  She fell on her left lower extremity, and cut it.  Had difficulty getting up, thus someone called an ambulance to help her get up.  She states she only has pain to her left lower extremity.  Did not hit her head, no loss of consciousness.  Denies any chest pain, shortness of breath, weakness.  Notes that she is not have any vision changes, or nausea/vomiting.  Her last tetanus was in 2021.  Only takes a baby aspirin, and denies a headache, she did not hit her head.  Home Medications Prior to Admission medications   Medication Sig Start Date End Date Taking? Authorizing Provider  amLODipine (NORVASC) 5 MG tablet TAKE 1 TABLET DAILY 10/22/22   Burchette, Elberta Fortis, MD  Calcium Carb-Cholecalciferol (CALCIUM CARBONATE-VITAMIN D3 PO) Take 1 tablet by mouth daily    [provider]  cholecalciferol (VITAMIN D) 1000 UNITS tablet Take 1,000 Units by mouth daily.    [provider]  denosumab (PROLIA) 60 MG/ML SOLN injection Inject 60 mg into the skin every 6 (six) months.    [provider]  docusate sodium (COLACE) 100 MG capsule Take 100 mg by mouth 2 (two) times daily.    [provider]  furosemide (LASIX) 40 MG tablet TAKE 1 TABLET DAILY 09/14/22   Burchette, Elberta Fortis, MD  gabapentin (NEURONTIN) 300 MG capsule TAKE 1 CAPSULE TWICE A DAY 12/06/22   Burchette, Elberta Fortis, MD  Golimumab (SIMPONI ARIA IV) Inject into the vein. Takes infusion every 8 wks.    [provider]  Multiple Vitamins-Minerals (MULTIVITAMIN,TX-MINERALS) tablet Take 1 tablet by mouth daily.    [provider]  Omega-3 Fatty Acids (FISH OIL OMEGA-3 PO) Take 1 capsule by mouth daily.    [provider]  oxyCODONE (OXYCONTIN) 10 mg 12 hr tablet Take 1 tablet (10 mg total) by mouth every 12 (twelve) hours. 07/05/23   Burchette, Elberta Fortis, MD  Oxycodone HCl 10 MG TABS Take 1 tablet (10 mg total) by mouth every 6 (six) hours as needed. Take 1 tablet by mouth every 6 hours as needed for breakthrough pain 06/10/23   Kristian Covey, MD  potassium chloride (KLOR-CON M) 10 MEQ tablet TAKE 1 TABLET TWICE A DAY 08/21/23   Burchette, Elberta Fortis, MD  predniSONE (DELTASONE) 1 MG tablet TAKE 4 TABLETS DAILY WITH BREAKFAST 07/04/23   Glean Salvo, NP  vitamin B-12 (CYANOCOBALAMIN) 100 MCG tablet Take 100 mcg by mouth daily.    [provider]      Allergies    Atorvastatin and Hydromorphone    Review of Systems   Review of Systems  Constitutional:  Negative for fever.  Skin:  Positive for wound.    Physical Exam Updated Vital Signs BP 111/76 (BP Location: Left Arm)   Pulse 72   Temp 97.7 F (36.5 C) (Oral)   Resp 16   Ht 5\' 1"  (1.549 m)  Wt 54.4 kg   SpO2 99%   BMI 22.67 kg/m  Physical Exam Vitals and nursing note reviewed.  Constitutional:      General: She is not in acute distress.    Appearance: She is well-developed.  HENT:     Head: Normocephalic and atraumatic.  Eyes:     Conjunctiva/sclera: Conjunctivae normal.  Cardiovascular:     Rate and Rhythm: Normal rate and regular rhythm.     Heart sounds: No murmur heard. Pulmonary:     Effort: Pulmonary effort is normal. No respiratory distress.     Breath sounds: Normal breath sounds.  Abdominal:     Palpations: Abdomen is soft.     Tenderness: There is no abdominal tenderness.  Musculoskeletal:        General: No swelling.     Cervical back: Neck supple.     Comments: TTP Of L  fibula/tibia  Skin:    General: Skin is warm and dry.     Capillary Refill: Capillary refill takes less than 2 seconds.     Comments: +V shaped 6cm laceration of LLE  Neurological:     Mental Status: She is alert.  Psychiatric:        Mood and Affect: Mood normal.     ED Results / Procedures / Treatments   Labs (all labs ordered are listed, but only abnormal results are displayed) Labs Reviewed - No data to display  EKG None  Radiology DG Tibia/Fibula Left  Result Date: 09/02/2023 CLINICAL DATA:  Fall.  Pain into upper tibia/fibula. EXAM: LEFT TIBIA AND FIBULA - 2 VIEW COMPARISON:  Left foot radiographs 12/26/2022, left tibia and fibula radiographs 07/09/2022 FINDINGS: Postsurgical changes are again seen of total left knee arthroplasty. No perihardware lucency is seen to indicate hardware failure or loosening. No knee joint effusion. Moderate plantar calcaneal heel spur. Moderate dorsal tarsometatarsal degenerative osteophytosis. No cortical erosions are seen. No acute fracture or dislocation. There is again chronic mineralization overlying the soft tissues lateral to the proximal fibula. No apparent concavity of the proximal lateral calf soft tissues, possible wound with patchy calcific density. IMPRESSION: 1. No acute fracture. 2. Postsurgical changes of total left knee arthroplasty without evidence of hardware failure. 3. Moderate plantar calcaneal heel spur. 4. Possible wound within the proximal lateral calf soft tissues with patchy soft tissue sclerosis. Recommend clinical correlation. No cortical erosion is seen. Electronically Signed   By: Neita Garnet M.D.   On: 09/02/2023 12:15    Procedures .Laceration Repair  Date/Time: 09/02/2023 12:38 PM  Performed by: Pete Pelt, PA Authorized by: Pete Pelt, PA   Consent:    Consent obtained:  Verbal   Consent given by:  Patient   Risks, benefits, and alternatives were discussed: yes     Risks discussed:  Infection    Alternatives discussed:  No treatment Universal protocol:    Patient identity confirmed:  Verbally with patient Anesthesia:    Anesthesia method:  Local infiltration   Local anesthetic:  Lidocaine 1% w/o epi Laceration details:    Location:  Leg   Leg location:  L lower leg   Length (cm):  5 Treatment:    Area cleansed with:  Povidone-iodine   Amount of cleaning:  Extensive   Irrigation solution:  Sterile saline Skin repair:    Repair method:  Steri-Strips and sutures   Suture size:  5-0 and 4-0   Suture material:  Prolene   Suture technique:  Simple interrupted and horizontal mattress (  1 horizontal mattress, 5 simple interrupted)   Number of sutures:  6   Number of Steri-Strips:  2 Approximation:    Approximation:  Close Repair type:    Repair type:  Intermediate Post-procedure details:    Dressing:  Non-adherent dressing and bulky dressing   Procedure completion:  Tolerated     Medications Ordered in ED Medications  lidocaine (PF) (XYLOCAINE) 1 % injection 10 mL (has no administration in time range)  acetaminophen (TYLENOL) tablet 1,000 mg (1,000 mg Oral Given 09/02/23 1102)    ED Course/ Medical Decision Making/ A&P                                 Medical Decision Making Patient is an 82 year old female, who is here for laceration, to the left lower leg, that happened [place] she [place] the post office after [place].  It appears that it was a mechanical fall she did not hit her head, she sustained a wound, to her left lower extremity.  Will obtain x-ray, given the tenderness, and then irrigate, and stitch.  Tetanus is up-to-date last tetanus was in 2021.  Amount and/or Complexity of Data Reviewed Radiology: ordered.    Details: X-ray shows soft tissue wound, no acute fracture Discussion of management or test interpretation with external provider(s): Discussed with patient, x-rays are reassuring.  Tetanus is up-to-date.  Good hemostasis, informed that she cannot have  sutures wet for the first 24 hours, and then can get wet.  Pressure dressing provided.  Discharged with strict return precautions, follow-up with PCP in 10 days, for suture removal  Risk OTC drugs. Prescription drug management.    Final Clinical Impression(s) / ED Diagnoses Final diagnoses:  Fall, initial encounter  Left leg pain  Laceration of left lower extremity, initial encounter    Rx / DC Orders ED Discharge Orders     None         Adan Baehr, Harley [place], PA 09/02/23 1427    Arby Barrette, MD 09/06/23 [phone]

## 2023-09-02 NOTE — ED Triage Notes (Signed)
The pt was bib [facility]. The pat was at the post office and was going out to her car. The patient turned with her walker and fell. The pt has a 2 or 3 in laceration to lle with swelling. Denies hitting head, losing consciousness, or back pain. Does not take any blood thinners, does take ASA. VS B/P 142/90, P 88, O2 Sats 98%RA.

## 2023-09-03 ENCOUNTER — Telehealth: Payer: Self-pay

## 2023-09-03 ENCOUNTER — Telehealth: Payer: Self-pay | Admitting: Family Medicine

## 2023-09-03 NOTE — Transitions of Care (Post Inpatient/ED Visit) (Signed)
   09/03/2023  Name: [name] MRN: [id number] DOB: 05-Apr-1941  Today's TOC FU Call Status: Today's TOC FU Call Status:: Unsuccessful Call (1st Attempt) Unsuccessful Call (1st Attempt) Date: 09/03/23  Attempted to reach the patient regarding the most recent Inpatient/ED visit.  Follow Up Plan: Additional outreach attempts will be made to reach the patient to complete the Transitions of Care (Post Inpatient/ED visit) call.   Signature Karena Addison, LPN [facility] Nurse Health Advisor Direct Dial [phone]

## 2023-09-03 NOTE — Telephone Encounter (Signed)
Pt is calling and per pt she received  (OXYCONTIN) 10 mg 12 hr tablet bottle but furosemide (LASIX) 40 MG tablet was inside the oxycontin bottle. Pt need a new rx for (OXYCONTIN) 10 mg 12 hr tablet #180  [facility] - [place], [place] - [address] Phone: [phone]  Fax: [phone]    Pt also  need new rx (OXYCONTIN) 10 mg 12 hr tablet #20 [facility] to [id number] [place], [place] - [address] AT [facility] OF [address] Phone: [phone]  Fax: [phone]

## 2023-09-04 NOTE — Telephone Encounter (Signed)
Pt calling to speak with Caryn Bee

## 2023-09-04 NOTE — Telephone Encounter (Signed)
Pt says she spoke with [facility] and they said she just needs a new prescription

## 2023-09-04 NOTE — Telephone Encounter (Signed)
Pt returning call regarding medication

## 2023-09-04 NOTE — Telephone Encounter (Signed)
I spoke with Gerilyn Pilgrim pharmacist with [facility] and he reported that the patient called in yesterday reporting that inside of her Oxycodone 10 mg bottle was Furosemide 40 mg. Gerilyn Pilgrim stated that per his review of previous note the patient provided verbal description of tablet that was inside Oxycodone bottle which was verified to be Furosemide. Gerilyn Pilgrim confirmed that Oxycodone was dispensed for patient on 07/10/2023 per review. Gerilyn Pilgrim noted that the patient did report she cannot find her prescription for Oxycodone at the time of previous call. Gerilyn Pilgrim informed me that he will contact the patient to collect more information to verify if there was a dispensing issue with her medications. I proceeded to contact the patient in regards to this and she reported that she has a pill organizer that she uses and noticed on 09/02/2023 that she was out of Oxycodone which prompted her to reach out to [facility]. Patient reported that she does not know what happened to remaining Oxycodone and would like to know what she should do moving forward.

## 2023-09-04 NOTE — Telephone Encounter (Signed)
Patient informed we will reach out once PCP reviews message

## 2023-09-05 NOTE — Telephone Encounter (Signed)
Pt called, very upset. Pt states she has been a good patient and does not understand why MD has not responded. Pt is beside herself, asking what is she supposed to do? Pt was assured several times that as soon as MD responded, CMA would call her back. Pt was unsatisfied with this response and is insisting someone else assist. Pt was again assured that as soon as we receive a response from MD, or another provider, she would get a call back. Pt stated she did not want to get ugly, but she really wants the reason today, for why MD did not respond.

## 2023-09-06 NOTE — Telephone Encounter (Signed)
Patient informed of the message below and voiced understanding  

## 2023-09-06 NOTE — Telephone Encounter (Signed)
Patient informed of the message below and voiced understanding. Patient has appt scheduled for 09/11/2023. Patient inquired of she can take an alternative medication while she is out of Oxycontin?

## 2023-09-09 ENCOUNTER — Other Ambulatory Visit: Payer: Self-pay | Admitting: Family Medicine

## 2023-09-10 NOTE — Transitions of Care (Post Inpatient/ED Visit) (Signed)
   09/10/2023  Name: LAIANA BAIERL MRN: [id number] DOB: [birth year]  Today's TOC FU Call Status: Today's TOC FU Call Status:: Unsuccessful Call (2nd Attempt) Unsuccessful Call (1st Attempt) Date: 09/03/23 Unsuccessful Call (2nd Attempt) Date: 09/10/23  Attempted to reach the patient regarding the most recent Inpatient/ED visit.  Follow Up Plan: Additional outreach attempts will be made to reach the patient to complete the Transitions of Care (Post Inpatient/ED visit) call.   Signature Karena Addison, LPN [facility] Nurse Health Advisor Direct Dial [phone]

## 2023-09-10 NOTE — Transitions of Care (Post Inpatient/ED Visit) (Signed)
   09/10/2023  Name: [name] MRN: [id number] DOB: [birth year]  Today's TOC FU Call Status: Today's TOC FU Call Status:: Unsuccessful Call (3rd Attempt) Unsuccessful Call (1st Attempt) Date: 09/03/23 Unsuccessful Call (2nd Attempt) Date: 09/10/23 Unsuccessful Call (3rd Attempt) Date: 09/10/23  Attempted to reach the patient regarding the most recent Inpatient/ED visit.  Follow Up Plan: No further outreach attempts will be made at this time. We have been unable to contact the patient.  Signature Karena Addison, LPN [facility] Nurse Health Advisor Direct Dial [phone]

## 2023-09-11 ENCOUNTER — Ambulatory Visit: Payer: Medicare Other | Admitting: Family Medicine

## 2023-09-11 ENCOUNTER — Encounter: Payer: Self-pay | Admitting: Family Medicine

## 2023-09-11 VITALS — BP 136/70 | HR 104 | Temp 97.3°F | Wt 119.3 lb

## 2023-09-11 DIAGNOSIS — M545 Low back pain, unspecified: Secondary | ICD-10-CM

## 2023-09-11 DIAGNOSIS — I10 Essential (primary) hypertension: Secondary | ICD-10-CM | POA: Diagnosis not present

## 2023-09-11 DIAGNOSIS — G8929 Other chronic pain: Secondary | ICD-10-CM | POA: Diagnosis not present

## 2023-09-11 DIAGNOSIS — S81812D Laceration without foreign body, left lower leg, subsequent encounter: Secondary | ICD-10-CM | POA: Diagnosis not present

## 2023-09-11 MED ORDER — OXYCODONE HCL 10 MG PO TABS: 10.0000 mg | ORAL_TABLET | Freq: Four times a day (QID) | ORAL | 0 refills | Status: AC | PRN

## 2023-09-11 NOTE — Patient Instructions (Signed)
Clean wound daily with soap and water  Apply small amount of vaseline to wound daily  Follow up for any signs of infection such as redness or pus like drainage.

## 2023-09-11 NOTE — Progress Notes (Unsigned)
Established Patient Office Visit  Subjective   Patient ID: [name], female    DOB: [birth year]  Age: 82 y.o. MRN: [id number]  Chief Complaint  Patient presents with   Hospitalization Follow-up   Suture / Staple Removal    HPI  {History (Optional):23778} [name] is seen following recent fall [place] the post office [place] 09[place]02-2023 with laceration left lateral leg.  She went to the ER and had laceration which required 6 sutures.  Plain x-rays tib-fib were unremarkable for fracture.  She also relates a bit of bruising right thigh and lateral hip region but no pain with ambulation right hip.  Regarding her fall she states she had been to the post office and had been milling Christmas cards and slipped on a puddle and fell while using her walker as she approaches her car.  Someone else assisted her getting up and they called 911 ambulance took her in for further evaluation.  No head injury.  No loss of consciousness.  Wound is done okay since then.  No drainage.  No erythema.  She has hypertension treated with amlodipine 5 mg daily.  Blood pressure up a bit when she first got here today but improved after rest 136/70.  On a separate issue, she had called last week regarding issues with her chronic pain medicine.  She has been for years and OxyContin 10 mg twice daily.  She gets this through [employer] and had 90 days sent out in October.  She states when she went to check her bottle she had furosemide in the bottle.  We explained that we would not be able to fill this early.  We even called [facility] to confirm they had sent 90-day supply of OxyContin which they did confirm.  Patient relates that she had someone help clean her house and they had someone that was in training that was new and she normally keeps her medication in her kitchen.  The trainee was in that region of the house and after cleaning the kitchen stated that she felt poorly and needed to go home.  In  hindsight [name] wonders if that person may have taken her medication.  She had 2 partial bottles with furosemide and states that those bottles were missing but the lot number of the medicine that was in her OxyContin bottle matched with furosemide.  She suspects the cleaner placed the furosemide into the OxyContin bottle and took the OxyContin.  She obviously has no way to prove that.  She has done actually surprisingly fairly well since she has run out of her OxyContin but has had severe back pain in the past and is concerned about breakthrough.  She is also getting ready to travel to [place] to be with her son over the holidays.  Past Medical History:  Diagnosis Date   Allergic rhinitis    Anemia    Anxiety    Bronchiectasis    Carpal tunnel syndrome 07/13/2015   Bilateral   CVA (cerebral infarction) 10/2007   Right thalamic    Diverticulosis of colon    Gait disorder    GERD (gastroesophageal reflux disease)    History of breast cancer    HTN (hypertension)    Hyperlipidemia    Idiopathic pulmonary fibrosis    Left knee DJD    Migraine    Osteoporosis    Peripheral neuropathy    PMR (polymyalgia rheumatica) (HCC)    Polyneuropathy in other diseases classified elsewhere (HCC)  02/17/2013   Previous back surgery 10/09/12   Renal insufficiency    Rheumatoid arthritis (HCC)    Seizure disorder (HCC)    Spondylosis, cervical, with myelopathy 08/31/2015   C3-4 myelopathy   Temporal arteritis (HCC)    Right eye blind, on steroids per Neruro/ Dr Anne Hahn   Type II or unspecified type diabetes mellitus without mention of complication, not stated as uncontrolled    2nd to steriods   Vitamin D deficiency    Past Surgical History:  Procedure Laterality Date   APPENDECTOMY  01/2021   CARPAL TUNNEL RELEASE Right    CATARACT EXTRACTION     OS - Summer 2010   CERVICAL FUSION  09/24/2008   4 rods and pins in place   CHOLECYSTECTOMY     COLONOSCOPY  12/15/2011   Procedure:  COLONOSCOPY;  Surgeon: Charna Elizabeth, MD;  Location: [facility];  Service: Endoscopy;  Laterality: N/A;   LUMBAR FUSION  04/24/2010   W/Mechanical fixation - L2-5 [facility]   LUMBAR FUSION  09/25/2011   rods in hips (to stabilize).   MASTECTOMY     NECK SURGERY     rheumatoid nodule removal     SPINAL FUSION  07/25/2010   T10-L2 interbody fusion / [facility]   TONSILLECTOMY     TOTAL KNEE ARTHROPLASTY     Left    reports that she has never smoked. She has never used smokeless tobacco. She reports that she does not currently use alcohol. She reports that she does not use drugs. family history includes Arthritis in her father and mother; Brain cancer in her mother; Breast cancer in her sister; Dementia in her father; Hypertension in her father and mother; Lung cancer in her sister. Allergies  Allergen Reactions   Atorvastatin Nausea Only and Other (See Comments)    Dizzyness.    Hydromorphone Other (See Comments)    Cognitive changes  "made me unconscious" Dilaudid    Review of Systems  Constitutional:  Negative for chills, fever and malaise/fatigue.  Eyes:  Negative for blurred vision.  Respiratory:  Negative for shortness of breath.   Cardiovascular:  Negative for chest pain.  Neurological:  Negative for dizziness, weakness and headaches.      Objective:     BP 136/70 (BP Location: Right Arm, Cuff Size: Normal)   Pulse (!) 104   Temp (!) 97.3 F (36.3 C) (Oral)   Wt 119 lb 4.8 oz (54.1 kg)   SpO2 95%   BMI 22.54 kg/m  BP Readings from Last 3 Encounters:  09/11/23 136/70  09/02/23 111/76  07/05/23 128/60   Wt Readings from Last 3 Encounters:  09/11/23 119 lb 4.8 oz (54.1 kg)  09/02/23 120 lb (54.4 kg)  07/12/23 122 lb (55.3 kg)      Physical Exam Constitutional:      Appearance: She is well-developed.  Eyes:     Pupils: Pupils are equal, round, and reactive to light.  Neck:     Thyroid: No thyromegaly.     Vascular: No JVD.   Cardiovascular:     Rate and Rhythm: Normal rate and regular rhythm.     Heart sounds:     No gallop.  Pulmonary:     Effort: Pulmonary effort is normal. No respiratory distress.     Breath sounds: Normal breath sounds. No wheezing or rales.  Musculoskeletal:     Cervical back: Neck supple.  Skin:    Comments: Left leg examined.  She has long laceration  left lateral leg with 6 sutures in place and 2 Steri-Strips.  Sutures are removed without difficulty.  She has a fairly significant area of eschar but no signs of cellulitis.  No drainage.  No fluctuance.  Neurological:     Mental Status: She is alert.      No results found for any visits on 09/11/23.  {Labs (Optional):23779}  The ASCVD Risk score (Arnett DK, et al., 2019) failed to calculate for the following reasons:   The 2019 ASCVD risk score is only valid for ages 37 to 72    Assessment & Plan:   #1 laceration left lateral leg following fall.  6 sutures removed.  Wound is healing uneventfully.  Continue to clean gently and daily with soap and water and apply small amount of Vaseline and keep covered for a few more days.  Follow-up promptly for any signs of second infection  #2 hypertension up initially but improved to 136/70 after repeat.  Continue amlodipine 5 mg daily  #3 chronic back pain.  She had issue with disappearance of her OxyContin as above.  We reminded her that is her responsibility to keep her medicine secure at all times under lock and key preferably.  She has taken oxycodone 10 mg in the past for severe breakthrough pain.  She will continue plain Tylenol.  We agreed to writing for 20 oxycodone 10 mg 1 every 6 hours as needed for only for severe pain.  She has scheduled follow-up in January to address her chronic pain and we will readdress at that point whether to reinitiate OxyContin.  Evelena Peat, MD

## 2023-09-12 ENCOUNTER — Ambulatory Visit: Payer: Medicare Other | Admitting: Podiatry

## 2023-09-12 ENCOUNTER — Telehealth: Payer: Self-pay | Admitting: Family Medicine

## 2023-09-26 NOTE — Telephone Encounter (Signed)
 Disregard

## 2023-10-07 ENCOUNTER — Encounter: Payer: Self-pay | Admitting: Family Medicine

## 2023-10-07 ENCOUNTER — Ambulatory Visit (INDEPENDENT_AMBULATORY_CARE_PROVIDER_SITE_OTHER): Payer: Medicare Other | Admitting: Family Medicine

## 2023-10-07 VITALS — BP 128/60 | HR 88 | Temp 97.8°F | Wt 124.5 lb

## 2023-10-07 DIAGNOSIS — G2581 Restless legs syndrome: Secondary | ICD-10-CM

## 2023-10-07 DIAGNOSIS — L720 Epidermal cyst: Secondary | ICD-10-CM

## 2023-10-07 DIAGNOSIS — M545 Low back pain, unspecified: Secondary | ICD-10-CM

## 2023-10-07 DIAGNOSIS — R29898 Other symptoms and signs involving the musculoskeletal system: Secondary | ICD-10-CM | POA: Diagnosis not present

## 2023-10-07 DIAGNOSIS — G8929 Other chronic pain: Secondary | ICD-10-CM

## 2023-10-07 MED ORDER — OXYCODONE HCL ER 10 MG PO T12A: 10.0000 mg | EXTENDED_RELEASE_TABLET | Freq: Two times a day (BID) | ORAL | 0 refills | Status: DC

## 2023-10-07 NOTE — Patient Instructions (Signed)
 I will be setting up physical therapy for leg strengthening  Consider Zostrix cream (OTC) for foot pain at night.

## 2023-10-07 NOTE — Progress Notes (Signed)
 Established Patient Office Visit  Subjective   Patient ID: [name], female    DOB: 05-31-1941  Age: 83 y.o. MRN: [id number]  No chief complaint on file.   HPI   [name] is here for medical follow-up.  We discussed several items as follows  Longstanding history of chronic low back pain for several years.  Multiple prior surgeries.  Recently ran out of her OxyContin  early and was basically missing medication and suspects this was stolen.  She initially did fairly well off medication but past couple weeks has become progressively less mobile.  With taking her usual dose of OxyContin  10 mg twice daily she is able to be more active with day-to-day activities.  She is due for new prescription at this time.  Previously she This in her kitchen but now she has special blocks and lock for her closet where she plans to keep this medication.  She had been somewhat less mobile recent weeks and feels like she has bilateral lower extremity weakness.  Requesting specific PT to address her lower extremity weakness and reduce risk of falls.  Small cystic lesion left parietal scalp.  Nontender.  She relates that recently her feet and lower legs feel like they want to jump .  This occurs almost exclusively at night.  Sometimes has associated pain.  She does have some chronic neuropathy and is on gabapentin .  No history of known restless legs.  Avoids late day use of caffeine.  Past Medical History:  Diagnosis Date   Allergic rhinitis    Anemia    Anxiety    Bronchiectasis    Carpal tunnel syndrome 07/13/2015   Bilateral   CVA (cerebral infarction) 10/2007   Right thalamic    Diverticulosis of colon    Gait disorder    GERD (gastroesophageal reflux disease)    History of breast cancer    HTN (hypertension)    Hyperlipidemia    Idiopathic pulmonary fibrosis    Left knee DJD    Migraine    Osteoporosis    Peripheral neuropathy    PMR (polymyalgia rheumatica) (HCC)     Polyneuropathy in other diseases classified elsewhere (HCC) 02/17/2013   Previous back surgery 10/09/12   Renal insufficiency    Rheumatoid arthritis (HCC)    Seizure disorder (HCC)    Spondylosis, cervical, with myelopathy 08/31/2015   C3-4 myelopathy   Temporal arteritis (HCC)    Right eye blind, 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   Type II or unspecified type diabetes mellitus without mention of complication, not stated as uncontrolled    2nd to steriods   Vitamin D deficiency    Past Surgical History:  Procedure Laterality Date   APPENDECTOMY  01/2021   CARPAL TUNNEL RELEASE Right    CATARACT EXTRACTION     OS - Summer 2010   CERVICAL FUSION  09/24/2008   4 rods and pins in place   CHOLECYSTECTOMY     COLONOSCOPY  12/15/2011   Procedure: COLONOSCOPY;  Surgeon: Renaye Sous, MD;  Location: [facility];  Service: Endoscopy;  Laterality: N/A;   LUMBAR FUSION  04/24/2010   W/Mechanical fixation - L2-5 [facility]   LUMBAR FUSION  09/25/2011   rods in hips (to stabilize).   MASTECTOMY     NECK SURGERY     rheumatoid nodule removal     SPINAL FUSION  07/25/2010   T10-L2 interbody fusion / [facility]   TONSILLECTOMY     TOTAL KNEE ARTHROPLASTY  Left    reports that she has never smoked. She has never used smokeless tobacco. She reports that she does not currently use alcohol. She reports that she does not use drugs. family history includes Arthritis in her father and mother; Brain cancer in her mother; Breast cancer in her sister; Dementia in her father; Hypertension in her father and mother; Lung cancer in her sister. Allergies  Allergen Reactions   Atorvastatin  Nausea Only and Other (See Comments)    Dizzyness.    Hydromorphone Other (See Comments)    Cognitive changes  made me unconscious Dilaudid    Review of Systems  Constitutional:  Negative for chills and fever.  Respiratory:  Negative for shortness of breath.   Cardiovascular:  Negative for chest  pain and leg swelling.  Musculoskeletal:  Positive for back pain.      Objective:     BP 128/60 (BP Location: Left Arm, Cuff Size: Normal)   Pulse 88   Temp 97.8 F (36.6 C) (Oral)   Wt 124 lb 8 oz (56.5 kg)   SpO2 99%   BMI 23.52 kg/m  BP Readings from Last 3 Encounters:  10/07/23 128/60  09/11/23 136/70  09/02/23 111/76   Wt Readings from Last 3 Encounters:  10/07/23 124 lb 8 oz (56.5 kg)  09/11/23 119 lb 4.8 oz (54.1 kg)  09/02/23 120 lb (54.4 kg)      Physical Exam Vitals reviewed.  Constitutional:      General: She is not in acute distress. HENT:     Head:     Comments: Exam reveals mobile well-demarcated cystic lesion left parietal scalp.  No overlying skin changes.  Nonfluctuant.  Nontender.  Cyst is about half centimeter in diameter Cardiovascular:     Rate and Rhythm: Normal rate and regular rhythm.  Pulmonary:     Effort: Pulmonary effort is normal.     Breath sounds: Normal breath sounds. No wheezing or rales.  Musculoskeletal:     Right lower leg: No edema.     Left lower leg: No edema.  Neurological:     Mental Status: She is alert.      No results found for any visits on 10/07/23.    The ASCVD Risk score (Arnett DK, et al., 2019) failed to calculate for the following reasons:   The 2019 ASCVD risk score is only valid for ages 31 to 65    Assessment & Plan:   #1 chronic low back pain with multiple prior surgeries.  She has been on OxyContin  for several years.  Recently was missing medication early and claims this may have been stolen.  She is now due for refills.  We discussed importance of her responsibility and keeping this in a safe location and she does have a new system in place with a closet that she plans to lock.  Refills provided for 3 months  #2 generalized weakness, especially lower legs.  Mostly related to immobility or limited mobility.  High risk for falls.  Currently ambulates with a walker.  Set up outpatient physical therapy  specifically to work on lower extremity strengthening and fall risk reduction  #3 benign epidermal cyst left parietal scalp.  Reassurance.  #4 possible restless leg syndrome.  She is describing symptoms of movement in her legs at night and sometimes associated pain.  Already on gabapentin .  We discussed other potential options such as dopamine agonist but at this point she wishes to observe.  Wolm Scarlet, MD

## 2023-10-08 DIAGNOSIS — M5442 Lumbago with sciatica, left side: Secondary | ICD-10-CM | POA: Diagnosis not present

## 2023-10-08 DIAGNOSIS — G8929 Other chronic pain: Secondary | ICD-10-CM | POA: Diagnosis not present

## 2023-10-15 DIAGNOSIS — Z111 Encounter for screening for respiratory tuberculosis: Secondary | ICD-10-CM | POA: Diagnosis not present

## 2023-10-15 DIAGNOSIS — M0589 Other rheumatoid arthritis with rheumatoid factor of multiple sites: Secondary | ICD-10-CM | POA: Diagnosis not present

## 2023-10-15 DIAGNOSIS — Z79899 Other long term (current) drug therapy: Secondary | ICD-10-CM | POA: Diagnosis not present

## 2023-10-15 DIAGNOSIS — R5383 Other fatigue: Secondary | ICD-10-CM | POA: Diagnosis not present

## 2023-10-16 ENCOUNTER — Other Ambulatory Visit: Payer: Self-pay | Admitting: Family Medicine

## 2023-10-16 NOTE — Therapy (Signed)
OUTPATIENT PHYSICAL THERAPY NEURO EVALUATION   Patient Name: [name] MRN: [id number] DOB:September 08, 1941, 83 y.o., female Today's Date: 10/17/2023   PCP: Kristian Covey, MD  REFERRING PROVIDER: Kristian Covey, MD   END OF SESSION:  PT End of Session - 10/17/23 1016     Visit Number 1    Number of Visits 17    Date for PT Re-Evaluation 12/12/23    Authorization Type Medicare/Tricare    PT Start Time [phone]    PT Stop Time 1014    PT Time Calculation (min) 36 min    Equipment Utilized During Treatment Gait belt    Activity Tolerance Patient tolerated treatment well    Behavior During Therapy WFL for tasks assessed/performed             Past Medical History:  Diagnosis Date   Allergic rhinitis    Anemia    Anxiety    Bronchiectasis    Carpal tunnel syndrome 07/13/2015   Bilateral   CVA (cerebral infarction) 10/2007   Right thalamic    Diverticulosis of colon    Gait disorder    GERD (gastroesophageal reflux disease)    History of breast cancer    HTN (hypertension)    Hyperlipidemia    Idiopathic pulmonary fibrosis    Left knee DJD    Migraine    Osteoporosis    Peripheral neuropathy    PMR (polymyalgia rheumatica) (HCC)    Polyneuropathy in other diseases classified elsewhere (HCC) 02/17/2013   Previous back surgery 10/09/12   Renal insufficiency    Rheumatoid arthritis (HCC)    Seizure disorder (HCC)    Spondylosis, cervical, with myelopathy 08/31/2015   C3-4 myelopathy   Temporal arteritis (HCC)    Right eye blind, on steroids per Neruro/ Dr Anne Hahn   Type II or unspecified type diabetes mellitus without mention of complication, not stated as uncontrolled    2nd to steriods   Vitamin D deficiency    Past Surgical History:  Procedure Laterality Date   APPENDECTOMY  01/2021   CARPAL TUNNEL RELEASE Right    CATARACT EXTRACTION     OS - Summer 2010   CERVICAL FUSION  09/24/2008   4 rods and pins in place   CHOLECYSTECTOMY     COLONOSCOPY   12/15/2011   Procedure: COLONOSCOPY;  Surgeon: Charna Elizabeth, MD;  Location: [facility];  Service: Endoscopy;  Laterality: N/A;   LUMBAR FUSION  04/24/2010   W/Mechanical fixation - L2-5 [facility]   LUMBAR FUSION  09/25/2011   rods in hips (to stabilize).   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) 08/25/2019   Pain management 10/30/2017   Rheumatoid arthritis (HCC) 04/25/2017   S/P cervical spinal fusion 09/05/2015   Spondylosis, cervical, with myelopathy 08/31/2015   Carpal tunnel syndrome 07/13/2015   Torn ear  lobe 06/07/2015   Obesity (BMI 30-39.9) 04/06/2014   Shortness of breath 07/14/2013   Bone cyst 07/14/2013   Polyneuropathy in other diseases classified elsewhere (HCC) 02/17/2013   Chronic back pain 12/09/2012   Peripheral edema 09/25/2012   Anemia due to chronic illness 09/01/2012   DDD (degenerative disc disease), lumbosacral 08/12/2012   Cervicalgia 07/11/2012   Disturbance of skin sensation 07/11/2012   Temporal arteritis (HCC) 04/01/2012   Lower GI bleed 12/24/2011   Rectal bleed 12/13/2011   Arthrodesis status 09/04/2011   Chronic renal insufficiency, stage II (mild) 04/04/2011   LUMBAR RADICULOPATHY, LEFT 04/18/2009   Essential hypertension 12/30/2007   SEIZURE DISORDER 12/30/2007   CEREBROVASCULAR ACCIDENT, HX OF 12/30/2007   Hyperlipidemia 10/08/2007   GERD 10/08/2007   DIVERTICULOSIS, COLON  10/08/2007   BRONCHIECTASIS 06/26/2007   PULMONARY FIBROSIS 05/17/2007   Osteoporosis 05/17/2007   BREAST CANCER, HX OF 05/17/2007   MASTECTOMY, LEFT, HX OF 05/17/2007    ONSET DATE: November 2024  REFERRING DIAG: R29.898 (ICD-10-CM) - Leg weakness, bilateral  THERAPY DIAG:  Abnormal posture  Other abnormalities of gait and mobility  Muscle weakness (generalized)  Unsteadiness on feet  Cervicalgia  Other low back pain  Rationale for Evaluation and Treatment: Rehabilitation  SUBJECTIVE:                                                                                                                                                                                             SUBJECTIVE STATEMENT: Patient reports that she previously helped assist with transfers for her sister who has dementia but has stopped d/t pulling something in her R shoulder. In recovering from her R shoulder, she was not able to be as active and is now experiencing more trouble with LE weakness. [place] the [employer] [place], cut her leg and had to go to the hospital. Had another fall 2 days ago, hitting the back of her head. 2 people who were driving by had to help her up. Denies headaches, neck pain, dizziness from her fall. Reports hx of LBP and neck pain. Reports that she uses her walker indoors and outdoors.      Pt accompanied by: self  PERTINENT HISTORY: Anemia, CTS, CVA 2009, GERD, breast CA s/p mastectomy, HTN, HLD, Idiopathic pulmonary fibrosis, migraine, osteoporosis, peripheral neuropathy, polymyalgia rheumatica, seizures, temporal arteritis, DMII, cervical fusion 2010, lumbar fusion 2011, 2013, L TKA  PAIN:  Are you having pain? No and none in sitting  PRECAUTIONS: Fall and Other: osteoporosis  RED FLAGS: None   WEIGHT BEARING RESTRICTIONS: No  FALLS: Has patient fallen in last 6 months? Yes.  Number of falls 2  LIVING ENVIRONMENT: Lives with: lives alone Lives in:  House/apartment Stairs:  ramp entry; 1 story with 14 steps into finished attic (reports that she is afraid to go up there) Has following equipment at home: Quad cane small base, Walker - 4 wheeled, shower chair, Grab bars, and Ramped entry   PLOF: Independent and has a cleaning lady every 3 weeks; drives to [place] every weekend to assist in taking care of her sister who has dementia    PATIENT GOALS: improve leg strength   OBJECTIVE:  Note: Objective measures were completed at Evaluation unless otherwise noted.  DIAGNOSTIC FINDINGS: 09/02/23 L lower leg xray:  No acute fracture. 2. Postsurgical changes of total left knee arthroplasty without evidence of hardware failure. 3. Moderate plantar calcaneal heel spur. 4. Possible wound within the proximal lateral calf soft tissues with patchy soft tissue sclerosis. Recommend clinical correlation. No cortical erosion is seen.  COGNITION: Overall cognitive status: Within functional limits for tasks assessed   SENSATION: Reports N/T in B feet  COORDINATION: Alternating pronation/supination: WNL B Alternating toe tap: WNL B Finger to nose: WNL B  POSTURE:  elevated shoulders, rounded posture, L lateral trunk flexion with R hip deviated further R ; fallen arch on L foot  LOWER EXTREMITY ROM:     Active  Right Eval Left Eval  Hip flexion    Hip extension    Hip abduction    Hip adduction    Hip internal rotation    Hip external rotation    Knee flexion    Knee extension    Ankle dorsiflexion 23 9  Ankle plantarflexion    Ankle inversion    Ankle eversion     (Blank rows = not tested)  LOWER EXTREMITY MMT:    MMT (in sitting) Right Eval Left Eval  Hip flexion 4+ 3+  Hip extension    Hip abduction 4 4  Hip adduction 4+ 4  Hip internal rotation    Hip external rotation    Knee flexion 4 4  Knee extension 4+ 4  Ankle dorsiflexion 4+ 4  Ankle plantarflexion 4 3+  Ankle inversion    Ankle eversion    (Blank rows = not  tested)   GAIT: Gait pattern: R hip instability and hip drop,  heavy anterior and L lean on walker Assistive device utilized: Walker - 4 wheeled Level of assistance: Modified independence   FUNCTIONAL TESTS:   [facility] PT Assessment - 10/17/23 0001       Standardized Balance Assessment   Standardized Balance Assessment 10 meter walk test;Five Times Sit to Stand    Five times sit to stand comments  13.73 sec   pulling up on walker and limited eccentric control   10 Meter Walk 13.77 sec with 4WW           2.38 ft/sec  TREATMENT DATE: 10/17/23     PATIENT EDUCATION: Education details: discussion on possible "life alert" in case of falls, prognosis, POC, HEP Person educated: Patient Education method: Explanation, Demonstration, Tactile cues, Verbal cues, and Handouts Education comprehension: verbalized understanding and returned demonstration  HOME EXERCISE PROGRAM: Access Code: [email or web address] URL: [email or web address] Date: 10/17/2023 Prepared by: [facility]  Exercises - Sit to Stand with Arms Crossed  - 1 x daily - 5 x weekly - 2 sets - 10 reps - Seated March with Resistance  - 1 x daily - 5 x weekly - 2 sets - 10 reps - Seated Scapular Retraction  - 1 x daily - 5 x weekly - 2 sets - 10 reps - 3 sec hold - Standing Hip Abduction with Counter Support  - 1 x daily - 5 x weekly - 2 sets - 10 reps  GOALS: Goals reviewed with patient? Yes  SHORT TERM GOALS: Target date: 11/14/2023  Patient to be independent with initial HEP. Baseline: HEP initiated Goal status: INITIAL    LONG TERM GOALS: Target date: 12/12/2023  Patient to be independent with advanced HEP. Baseline: Not yet initiated  Goal status: INITIAL  Patient to demonstrate B LE strength >/=4/5.  Baseline: See above Goal status:  INITIAL  Patient to report understanding of posture and body mechanics education for management of neck and LBP.  Baseline: not yet initiated  Goal status: INITIAL  Patient to report putting plans to action in getting a life alert or other emergency communication system in case of falls.  Baseline: not yet in effect Goal status: INITIAL  Patient to demonstrate gait speed of at least 2.63 ft/sec in order to improve access to community.   Baseline: 2.3 ft/sec Goal status: INITIAL  Patient to demonstrate 5xSTS test in <15 sec without use of UEs and with good eccentric control in order to decrease risk of falls.  Baseline: 13.73 sec pulling up on walker and with decreased eccentric control Goal status: INITIAL  Patient to score at least 46/56 on Berg in order to decrease risk of falls.  Baseline: NT Goal status: INITIAL  ASSESSMENT:  CLINICAL IMPRESSION:  Patient is a y/o 70 F presenting to [facility] with c/o LE weakness for the past few months. Reports 2 falls in the past 6 months, with one occurring 2 days ago resulting in hitting the back of the head. Patient denies new symptoms since this fall but reports that she was not able to transfer from the floor and had to have passersby assist her. Patient today presenting with abnormal posture, decreased L ankle ROM, L>R LE weakness, gait deviations, decreased gait speed, and decreased control with transfers. Patient was educated on gentle LE strengthening  HEP and reported understanding. Prior to current episode, patient was independent. Would benefit from skilled PT services 2 x/week for 8 weeks to address aforementioned impairments in order to optimize level of function.    OBJECTIVE IMPAIRMENTS: Abnormal gait, decreased activity tolerance, decreased balance, difficulty walking, decreased ROM, decreased strength, increased muscle spasms, impaired flexibility, postural dysfunction, and pain.   ACTIVITY LIMITATIONS: carrying, lifting, bending,  sitting, standing, squatting, stairs, transfers, bathing, toileting, dressing, reach over head, hygiene/grooming, and locomotion level  PARTICIPATION LIMITATIONS: meal prep, cleaning, laundry, driving, shopping, community activity, and church  PERSONAL FACTORS: Age, Behavior pattern, Past/current experiences, Time since onset of injury/illness/exacerbation, and 3+ comorbidities: Anemia, CTS, CVA 2009, GERD, breast CA s/p mastectomy, HTN, HLD, Idiopathic pulmonary fibrosis, migraine, osteoporosis,  peripheral neuropathy, polymyalgia rheumatica, seizures, temporal arteritis, DMII, cervical fusion 2010, lumbar fusion 2011, 2013, L TKA  are also affecting patient's functional outcome.   REHAB POTENTIAL: Good  CLINICAL DECISION MAKING: Evolving/moderate complexity  EVALUATION COMPLEXITY: Moderate  PLAN:  PT FREQUENCY: 2x/week  PT DURATION: 8 weeks  PLANNED INTERVENTIONS: 97164- PT Re-evaluation, 97110-Therapeutic exercises, 97530- Therapeutic activity, 97112- Neuromuscular re-education, 97535- Self Care, 40981- Manual therapy, [phone]- Gait training, [phone]- Aquatic Therapy, 97014- Electrical stimulation (unattended), Patient/Family education, Balance training, Stair training, Taping, Dry Needling, Joint mobilization, Spinal mobilization, Vestibular training, Cryotherapy, and Moist heat  PLAN FOR NEXT SESSION: Berg, review HEP, progress LE strengthening, postural strengthening and stretching to address L trunk lean    Baldemar Friday, PT, DPT 10/17/23 10:30 AM  [facility] [address], [address] [place], [place] [address] Phone # [phone] Fax # [phone]

## 2023-10-17 ENCOUNTER — Other Ambulatory Visit: Payer: Self-pay

## 2023-10-17 ENCOUNTER — Encounter: Payer: Self-pay | Admitting: Physical Therapy

## 2023-10-17 ENCOUNTER — Ambulatory Visit: Payer: Medicare Other | Attending: Family Medicine | Admitting: Physical Therapy

## 2023-10-17 DIAGNOSIS — R2689 Other abnormalities of gait and mobility: Secondary | ICD-10-CM | POA: Diagnosis not present

## 2023-10-17 DIAGNOSIS — M6281 Muscle weakness (generalized): Secondary | ICD-10-CM | POA: Insufficient documentation

## 2023-10-17 DIAGNOSIS — M5459 Other low back pain: Secondary | ICD-10-CM | POA: Diagnosis not present

## 2023-10-17 DIAGNOSIS — R29898 Other symptoms and signs involving the musculoskeletal system: Secondary | ICD-10-CM | POA: Insufficient documentation

## 2023-10-17 DIAGNOSIS — R293 Abnormal posture: Secondary | ICD-10-CM | POA: Insufficient documentation

## 2023-10-17 DIAGNOSIS — R2681 Unsteadiness on feet: Secondary | ICD-10-CM | POA: Diagnosis not present

## 2023-10-17 DIAGNOSIS — M542 Cervicalgia: Secondary | ICD-10-CM | POA: Diagnosis not present

## 2023-10-19 ENCOUNTER — Emergency Department (HOSPITAL_COMMUNITY): Payer: Medicare Other

## 2023-10-19 ENCOUNTER — Emergency Department (HOSPITAL_COMMUNITY)
Admission: EM | Admit: 2023-10-19 | Discharge: 2023-10-19 | Disposition: A | Discharge status: Home / Self Care | Payer: Medicare Other | Attending: Emergency Medicine | Admitting: Emergency Medicine

## 2023-10-19 DIAGNOSIS — S0083XA Contusion of other part of head, initial encounter: Secondary | ICD-10-CM | POA: Diagnosis not present

## 2023-10-19 DIAGNOSIS — S80212A Abrasion, left knee, initial encounter: Secondary | ICD-10-CM | POA: Insufficient documentation

## 2023-10-19 DIAGNOSIS — S12601A Unspecified nondisplaced fracture of seventh cervical vertebra, initial encounter for closed fracture: Secondary | ICD-10-CM | POA: Diagnosis not present

## 2023-10-19 DIAGNOSIS — M4313 Spondylolisthesis, cervicothoracic region: Secondary | ICD-10-CM | POA: Diagnosis not present

## 2023-10-19 DIAGNOSIS — I10 Essential (primary) hypertension: Secondary | ICD-10-CM | POA: Diagnosis not present

## 2023-10-19 DIAGNOSIS — Z79899 Other long term (current) drug therapy: Secondary | ICD-10-CM | POA: Insufficient documentation

## 2023-10-19 DIAGNOSIS — W19XXXA Unspecified fall, initial encounter: Secondary | ICD-10-CM

## 2023-10-19 DIAGNOSIS — R9082 White matter disease, unspecified: Secondary | ICD-10-CM | POA: Diagnosis not present

## 2023-10-19 DIAGNOSIS — S0990XA Unspecified injury of head, initial encounter: Secondary | ICD-10-CM | POA: Diagnosis present

## 2023-10-19 DIAGNOSIS — Z23 Encounter for immunization: Secondary | ICD-10-CM | POA: Diagnosis not present

## 2023-10-19 DIAGNOSIS — W01198A Fall on same level from slipping, tripping and stumbling with subsequent striking against other object, initial encounter: Secondary | ICD-10-CM | POA: Diagnosis not present

## 2023-10-19 DIAGNOSIS — M5030 Other cervical disc degeneration, unspecified cervical region: Secondary | ICD-10-CM | POA: Diagnosis not present

## 2023-10-19 DIAGNOSIS — T148XXA Other injury of unspecified body region, initial encounter: Secondary | ICD-10-CM

## 2023-10-19 DIAGNOSIS — Z981 Arthrodesis status: Secondary | ICD-10-CM | POA: Diagnosis not present

## 2023-10-19 DIAGNOSIS — Z96652 Presence of left artificial knee joint: Secondary | ICD-10-CM | POA: Diagnosis not present

## 2023-10-19 MED ORDER — TETANUS-DIPHTH-ACELL PERTUSSIS 5-2.5-18.5 LF-MCG/0.5 IM SUSY
0.5000 mL | PREFILLED_SYRINGE | Freq: Once | INTRAMUSCULAR | Status: AC
  Administered 2023-10-19: 0.5 mL via INTRAMUSCULAR
  Filled 2023-10-19: qty 0.5

## 2023-10-19 MED ORDER — BACITRACIN ZINC 500 UNIT/GM EX OINT
TOPICAL_OINTMENT | Freq: Two times a day (BID) | CUTANEOUS | Status: DC
  Administered 2023-10-19: 1 via TOPICAL
  Filled 2023-10-19: qty 0.9

## 2023-10-19 MED ORDER — ACETAMINOPHEN 325 MG PO TABS
650.0000 mg | ORAL_TABLET | Freq: Once | ORAL | Status: AC
  Administered 2023-10-19: 650 mg via ORAL
  Filled 2023-10-19: qty 2

## 2023-10-19 NOTE — ED Triage Notes (Signed)
Patient reports she fell last night and hurt left knee Says skin is torn off Pain rated 6/10 Denies any loss of ROM in left knee

## 2023-10-19 NOTE — ED Provider Notes (Signed)
[place] [facility]MENT AT [facility] Provider Note   CSN: [id number] Arrival date & time: 10/19/23  4098     History  Chief Complaint  Patient presents with   left knee pain    [name] is a 83 y.o. female.  Patient with history of hypertension, hyperlipidemia, RA, osteoarthritis presents today with complaints of fall.  She states that same occurred yesterday evening when she tripped and fell and struck her face as well as her left knee on the ground.  She did not lose consciousness.  She is not anticoagulated.  She was able to get up off the ground and walk around with some discomfort.  She has a skin abrasion on her left knee and has pain to same. She also notes that she is having difficulty getting the wound on her knee to stop bleeding as every time she bends her knee it begins to bleed again. Presents for same. Also has some bruising on her forehead which is tender to touch as well.  She denies any other injuries or complaints.  No changes in her vision or nausea, vomiting.  She is unsure of her last tetanus.  The history is provided by the patient. No language interpreter was used.       Home Medications Prior to Admission medications   Medication Sig Start Date End Date Taking? Authorizing Provider  amLODipine (NORVASC) 5 MG tablet TAKE 1 TABLET DAILY 10/16/23   Burchette, Elberta Fortis, MD  Calcium Carb-Cholecalciferol (CALCIUM CARBONATE-VITAMIN D3 PO) Take 1 tablet by mouth daily    [provider]  cholecalciferol (VITAMIN D) 1000 UNITS tablet Take 1,000 Units by mouth daily.    [provider]  denosumab (PROLIA) 60 MG/ML SOLN injection Inject 60 mg into the skin every 6 (six) months.    [provider]  docusate sodium (COLACE) 100 MG capsule Take 100 mg by mouth 2 (two) times daily.    [provider]  furosemide (LASIX) 40 MG tablet TAKE 1 TABLET DAILY 09/10/23   Burchette, Elberta Fortis, MD  gabapentin (NEURONTIN) 300  MG capsule TAKE 1 CAPSULE TWICE A DAY 12/06/22   Burchette, Elberta Fortis, MD  Golimumab (SIMPONI ARIA IV) Inject into the vein. Takes infusion every 8 wks.    [provider]  Multiple Vitamins-Minerals (MULTIVITAMIN,TX-MINERALS) tablet Take 1 tablet by mouth daily.    [provider]  Omega-3 Fatty Acids (FISH OIL OMEGA-3 PO) Take 1 capsule by mouth daily.    [provider]  oxyCODONE (OXYCONTIN) 10 mg 12 hr tablet Take 1 tablet (10 mg total) by mouth every 12 (twelve) hours. 10/07/23   Burchette, Elberta Fortis, MD  Oxycodone HCl 10 MG TABS Take 1 tablet (10 mg total) by mouth every 6 (six) hours as needed. Take 1 tablet by mouth every 6 hours as needed for breakthrough pain 09/11/23   Kristian Covey, MD  potassium chloride (KLOR-CON M) 10 MEQ tablet TAKE 1 TABLET TWICE A DAY 08/21/23   Burchette, Elberta Fortis, MD  predniSONE (DELTASONE) 1 MG tablet TAKE 4 TABLETS DAILY WITH BREAKFAST 07/04/23   Glean Salvo, NP  vitamin B-12 (CYANOCOBALAMIN) 100 MCG tablet Take 100 mcg by mouth daily.    [provider]      Allergies    Atorvastatin and Hydromorphone    Review of Systems   Review of Systems  Musculoskeletal:  Positive for arthralgias and myalgias.  All other systems reviewed and are negative.  Physical Exam Updated Vital Signs BP (!) 146/83 (BP Location: Right Arm)   Pulse 88   Temp 98.2 F (36.8 C) (Oral)   Resp 16   Ht 5\' 1"  (1.549 m)   Wt 55.3 kg   SpO2 99%   BMI 23.05 kg/m  Physical Exam Vitals and nursing note reviewed.  Constitutional:      General: She is not in acute distress.    Appearance: Normal appearance. She is normal weight. She is not ill-appearing, toxic-appearing or diaphoretic.  HENT:     Head: Normocephalic and atraumatic.     Comments: No racoon eyes No battle sign  Hematoma noted to the upper forehead. No overlying wounds, crepitus, or deformity.  Eyes:     Extraocular Movements: Extraocular movements intact.     Pupils:  Pupils are equal, round, and reactive to light.  Cardiovascular:     Rate and Rhythm: Normal rate.     Comments: No tenderness to palpation of the anterior chest wall Pulmonary:     Effort: Pulmonary effort is normal. No respiratory distress.  Abdominal:     Comments: No abdominal tenderness or bruising  Musculoskeletal:        General: Normal range of motion.     Cervical back: Normal and normal range of motion.     Thoracic back: Normal.     Lumbar back: Normal.     Comments: No midline tenderness, no stepoffs or deformity noted on palpation of cervical, thoracic, and lumbar spine  Left knee with superficial skin abrasion noted slowing oozing blood with manipulation. No obvious deformity. DP and PT pulses intact and 2+. ROM intact with some discomfort.  Ambulatory with steady gait.  Skin:    General: Skin is warm and dry.  Neurological:     General: No focal deficit present.     Mental Status: She is alert and oriented to person, place, and time.  Psychiatric:        Mood and Affect: Mood normal.        Behavior: Behavior normal.     ED Results / Procedures / Treatments   Labs (all labs ordered are listed, but only abnormal results are displayed) Labs Reviewed - No data to display  EKG None  Radiology CT Cervical Spine Wo Contrast Result Date: 10/19/2023 CLINICAL DATA:  83 year old female status post fall overnight striking head. EXAM: CT CERVICAL SPINE WITHOUT CONTRAST TECHNIQUE: Multidetector CT imaging of the cervical spine was performed without intravenous contrast. Multiplanar CT image reconstructions were also generated. RADIATION DOSE REDUCTION: This exam was performed according to the departmental dose-optimization program which includes automated exposure control, adjustment of the mA and/or kV according to patient size and/or use of iterative reconstruction technique. COMPARISON:  Cervical spine radiographs 06/28/2023. CT face 01/09/2008. MRI cervical spine  07/09/2015. FINDINGS: Alignment: Appears stable to the radiographs last year. Degenerative appearing spondylolisthesis at C7-T1. Maintained posterior element alignment. Skull base and vertebrae: Visualized skull base is intact. No atlanto-occipital dissociation. Heterogeneous partially degenerative appearing bone mineralization. C1 and C2 appear chronically degenerated but intact and aligned. Postoperative details are below. Corticated and chronic appearing right C7 pedicle fracture on sagittal image 27. Similar contralateral pedicle fracture, sagittal image 42 and appears more corticated on that side series 8, image 47. Both fractures are essentially nondisplaced. No other cervical vertebral fracture is identified. Soft tissues and spinal canal: No prevertebral fluid or swelling. No visible canal hematoma. Negative noncontrast neck soft tissues. Disc levels: Cervical ACDF C3 through C7  with heterogeneous but solid appearing arthrodesis. Superimposed degenerative appearing ankylosis of the C2-C3 posterior elements. No hardware loosening identified. Spondylolisthesis at C7-T1 appears to be chronic and measures up to 4 mm. Chronic bilateral facet arthropathy, severe disc and endplate degeneration at that level superimposed on the C7 pedicle abnormalities detailed earlier. Upper chest: Severe T1 superior endplate degeneration has progressed since the 2016 MRI. Mild T4 superior endplate compression (sagittal image 34) stable since the MRI. Mild apical lung scarring. IMPRESSION: 1. Chronic appearing and nondisplaced bilateral C7 pedicle fractures. Underlying chronic and degenerative appearing spondylolisthesis at C7-T1. Maintained facet alignment, with chronic facet and severe disc and endplate degeneration at that level. 2. Superimposed C3 through C7 ACDF and ankylosed adjacent C2-C3 segment also. Heterogeneous but solid appearing arthrodesis. 3. No acute traumatic injury identified in the cervical spine. 4. Chronic  mild T3 compression fracture. Electronically Signed   By: Odessa Fleming M.D.   On: 10/19/2023 09:18   DG Knee Complete 4 Views Left Result Date: 10/19/2023 CLINICAL DATA:  83 year old female status post fall overnight striking head. EXAM: LEFT KNEE - COMPLETE 4+ VIEW COMPARISON:  Left knee series 07/09/2022. FINDINGS: Four views at 0824 hours. Chronic left knee arthroplasty. No evidence of joint effusion on cross-table lateral. Hardware appears intact and aligned. Calcified peripheral vascular disease is more apparent. No acute osseous abnormality identified. Other chronic soft tissue dystrophic calcifications. IMPRESSION: 1. Chronic left knee arthroplasty. No acute osseous abnormality identified. 2. Calcified peripheral vascular disease. Electronically Signed   By: Odessa Fleming M.D.   On: 10/19/2023 09:11   CT Head Wo Contrast Result Date: 10/19/2023 CLINICAL DATA:  83 year old female status post fall overnight striking head. EXAM: CT HEAD WITHOUT CONTRAST TECHNIQUE: Contiguous axial images were obtained from the base of the skull through the vertex without intravenous contrast. RADIATION DOSE REDUCTION: This exam was performed according to the departmental dose-optimization program which includes automated exposure control, adjustment of the mA and/or kV according to patient size and/or use of iterative reconstruction technique. COMPARISON:  Head CT and brain MRI 08/02/2022. FINDINGS: Brain: Cerebral volume is stable, normal for age. No midline shift, mass effect, or evidence of intracranial mass lesion. No ventriculomegaly. No acute intracranial hemorrhage identified. Advanced chronic small vessel disease with chronic hypodensity in the right thalamus, bilateral cerebral white matter. Stable gray-white differentiation since 2023. Vascular: No suspicious intracranial vascular hyperdensity. Calcified atherosclerosis at the skull base. Skull: No acute osseous abnormality identified. Sinuses/Orbits: Visualized paranasal  sinuses and mastoids are stable and well aerated. Other: Mild anterior forehead scalp hematoma or contusion series 5, image 31. Underlying frontal bones and sinuses appear intact, hyperostosis. Stable scalp and orbits soft tissues elsewhere. IMPRESSION: 1. Mild anterior forehead scalp hematoma or contusion. No skull fracture. 2. No acute intracranial abnormality. Stable non contrast CT appearance of chronic small vessel disease. Electronically Signed   By: Odessa Fleming M.D.   On: 10/19/2023 09:05    Procedures Procedures    Medications Ordered in ED Medications  Tdap (BOOSTRIX) injection 0.5 mL (has no administration in time range)  bacitracin ointment (has no administration in time range)  acetaminophen (TYLENOL) tablet 650 mg (has no administration in time range)    ED Course/ Medical Decision Making/ A&P                                 Medical Decision Making Amount and/or Complexity of Data Reviewed Radiology: ordered.  Risk OTC  drugs. Prescription drug management.   Patient presents today with complaints of fall yesterday evening.  They are afebrile, nontoxic-appearing, and in no acute distress with reassuring vital signs.  Physical exam reveals bruising to the upper forehead as well as superficial skin abrasion to the left knee slowly oozing venous blood on manipulation, hemostasis easily achieved with direct pressure. Wound is not able to be closed with sutures or dermabond. Patient without signs of serious head, neck, or back injury.  However, given patient's age, will obtain CT head and cervical spine.  No midline spinal tenderness or TTP of the chest or abd.  Normal neurological exam. No concern for closed head injury, lung injury, or intraabdominal injury. X-ray imaging obtained of the left knee and CT of the head and neck which has resulted and reveals  Chronic changes, no acute findings  I have personally reviewed and interpreted this imaging and agree with radiology  interpretation.  Radiology without acute abnormality.  Patient is able to ambulate without difficulty in the ED.  Pt is hemodynamically stable, in NAD.   Pain has been managed & pt has no complaints prior to dc.  Patient counseled on typical course of muscle stiffness and soreness post-fall. Discussed s/s that should cause them to return.  Her wound has also been cleaned and dressed and although hemostasis achieved with direct pressure, placed combat gauze over same to facilitate continuing hemostasis as patient complains that the wound will bleed every time she bends her knee.  After combat gauze applied, patient able to ambulate without any residual bleeding.  Her wound has been cleaned and dressed as well.  Tetanus updated.  Patient instructed on Tylenol use. Encouraged PCP follow-up for recheck if symptoms are not improved in one week. Evaluation and diagnostic testing in the emergency department does not suggest an emergent condition requiring admission or immediate intervention beyond what has been performed at this time.  Plan for discharge with close PCP follow-up.  Patient is understanding and amenable with plan, educated on red flag symptoms that would prompt immediate return.  Patient discharged in stable condition.  Final Clinical Impression(s) / ED Diagnoses Final diagnoses:  Fall, initial encounter  Skin abrasion    Rx / DC Orders ED Discharge Orders     None     An After Visit Summary was printed and given to the patient.     Vear Clock 10/19/23 1013    Virgina Norfolk, DO 10/19/23 1349

## 2023-10-19 NOTE — Discharge Instructions (Addendum)
As we discussed, your workup in the ER today was reassuring for acute findings.  X-ray imaging of your knee and CT imaging of your head and neck did not reveal any emergent concerns.  We have placed a combat gauze over the wound on your knee to help it stop bleeding.  Please leave this on for 1 day at least.  After this time, you can remove the dressing and rinse to with soap and water.  Please pat dry do not scrub.  Please keep the wound clean and dry with regular daily dressing changes and wound checks.  Follow-up with your primary doctor in the next few days so that they can perform a wound check as well to ensure it is healing appropriately.  Return if development of any new or worsening symptoms.

## 2023-10-23 ENCOUNTER — Ambulatory Visit: Payer: Medicare Other | Admitting: Physical Therapy

## 2023-10-28 ENCOUNTER — Ambulatory Visit: Payer: Medicare Other | Admitting: Physical Therapy

## 2023-10-28 ENCOUNTER — Ambulatory Visit (INDEPENDENT_AMBULATORY_CARE_PROVIDER_SITE_OTHER): Payer: Medicare Other | Admitting: Family Medicine

## 2023-10-28 ENCOUNTER — Encounter: Payer: Self-pay | Admitting: Family Medicine

## 2023-10-28 VITALS — BP 154/76 | HR 91 | Temp 97.9°F | Wt 124.7 lb

## 2023-10-28 DIAGNOSIS — M810 Age-related osteoporosis without current pathological fracture: Secondary | ICD-10-CM

## 2023-10-28 DIAGNOSIS — S80212D Abrasion, left knee, subsequent encounter: Secondary | ICD-10-CM | POA: Diagnosis not present

## 2023-10-28 DIAGNOSIS — R296 Repeated falls: Secondary | ICD-10-CM

## 2023-10-28 DIAGNOSIS — R531 Weakness: Secondary | ICD-10-CM

## 2023-10-28 DIAGNOSIS — S0083XD Contusion of other part of head, subsequent encounter: Secondary | ICD-10-CM

## 2023-10-28 NOTE — Progress Notes (Signed)
Established Patient Office Visit  Subjective   Patient ID: [name], female    DOB: 02-07-1941  Age: 83 y.o. MRN: [id number]  Chief Complaint  Patient presents with   Hospitalization Follow-up    HPI   [name] is seen today for evaluation after ER visit on the 25th.  She states she has had a couple of recent falls.  First 1 occurred about a week prior to her ER visit.  She was getting out of her car to get mail and feels like she got her feet tangled up and fell backwards and hit her head against cement.  No loss of consciousness.  She had some dull headache afterwards.  No confusion.  Second fall occurred apparently 1 day prior to her ER visit on 25 January.  She states she was packing some things in her bedroom to go to [place] to visit her sister when she again feels like she got her feet twisted and fell forward.  She sustained abrasion to the left anterior knee.  Also contusion of the forehead.  No loss of consciousness.  Has felt off balance frequently.  She had x-rays of the left knee which showed no acute abnormalities.  CT head and cervical spine showed no acute findings.  Denies any headache at this time.  She does have osteoporosis history and is on Prolia and also takes calcium and vitamin D regularly.  She has had some ongoing left anterior knee pain from the abrasion but no other extremity injuries reported.  She does ambulate with a walker  She actually is already scheduled to start PT tomorrow.  She feels like she has developed increasing lower extremity weakness due to inactivity and she knows she needs to work on strengthening her legs.  Past Medical History:  Diagnosis Date   Allergic rhinitis    Anemia    Anxiety    Bronchiectasis    Carpal tunnel syndrome 07/13/2015   Bilateral   CVA (cerebral infarction) 10/2007   Right thalamic    Diverticulosis of colon    Gait disorder    GERD (gastroesophageal reflux disease)    History of breast cancer    HTN  (hypertension)    Hyperlipidemia    Idiopathic pulmonary fibrosis    Left knee DJD    Migraine    Osteoporosis    Peripheral neuropathy    PMR (polymyalgia rheumatica) (HCC)    Polyneuropathy in other diseases classified elsewhere (HCC) 02/17/2013   Previous back surgery 10/09/12   Renal insufficiency    Rheumatoid arthritis (HCC)    Seizure disorder (HCC)    Spondylosis, cervical, with myelopathy 08/31/2015   C3-4 myelopathy   Temporal arteritis (HCC)    Right eye blind, on steroids per [facility]/ Dr Anne Hahn   Type II or unspecified type diabetes mellitus without mention of complication, not stated as uncontrolled    2nd to steriods   Vitamin D deficiency    Past Surgical History:  Procedure Laterality Date   APPENDECTOMY  01/2021   CARPAL TUNNEL RELEASE Right    CATARACT EXTRACTION     OS - Summer 2010   CERVICAL FUSION  09/24/2008   4 rods and pins in place   CHOLECYSTECTOMY     COLONOSCOPY  12/15/2011   Procedure: COLONOSCOPY;  Surgeon: Charna Elizabeth, MD;  Location: [facility] ENDOSCOPY;  Service: Endoscopy;  Laterality: N/A;   LUMBAR FUSION  04/24/2010   W/Mechanical fixation - L2-5 [facility]   LUMBAR FUSION  09/25/2011   rods in hips (to stabilize).   MASTECTOMY     NECK SURGERY     rheumatoid nodule removal     SPINAL FUSION  07/25/2010   T10-L2 interbody fusion / [facility]   TONSILLECTOMY     TOTAL KNEE ARTHROPLASTY     Left    reports that she has never smoked. She has never used smokeless tobacco. She reports that she does not currently use alcohol. She reports that she does not use drugs. family history includes Arthritis in her father and mother; Brain cancer in her mother; Breast cancer in her sister; Dementia in her father; Hypertension in her father and mother; Lung cancer in her sister. Allergies  Allergen Reactions   Atorvastatin Nausea Only and Other (See Comments)    Dizzyness.    Hydromorphone Other (See Comments)    Cognitive changes  "made me  unconscious" Dilaudid    Review of Systems  Constitutional:  Negative for chills, fever and malaise/fatigue.  Eyes:  Negative for blurred vision.  Respiratory:  Negative for shortness of breath.   Cardiovascular:  Negative for chest pain.  Genitourinary:  Negative for dysuria.  Neurological:  Negative for dizziness, weakness and headaches.      Objective:     BP (!) 154/76 (BP Location: Left Arm, Patient Position: Sitting, Cuff Size: Normal)   Pulse 91   Temp 97.9 F (36.6 C) (Oral)   Wt 124 lb 11.2 oz (56.6 kg)   SpO2 100%   BMI 23.56 kg/m  BP Readings from Last 3 Encounters:  10/28/23 (!) 154/76  10/19/23 (!) 156/92  10/07/23 128/60   Wt Readings from Last 3 Encounters:  10/28/23 124 lb 11.2 oz (56.6 kg)  10/19/23 122 lb (55.3 kg)  10/07/23 124 lb 8 oz (56.5 kg)      Physical Exam Vitals reviewed.  Constitutional:      Appearance: She is well-developed.  HENT:     Head:     Comments: Does have some visible ecchymosis mid forehead.  No hematoma. Eyes:     Pupils: Pupils are equal, round, and reactive to light.  Neck:     Thyroid: No thyromegaly.     Vascular: No JVD.  Cardiovascular:     Rate and Rhythm: Normal rate and regular rhythm.     Heart sounds:     No gallop.  Pulmonary:     Effort: Pulmonary effort is normal. No respiratory distress.     Breath sounds: Normal breath sounds. No wheezing or rales.  Musculoskeletal:     Cervical back: Neck supple.  Skin:    Comments: Left anterior knee reveals fairly large abrasion about 4 x 4 cm.  No surrounding cellulitis changes.  No drainage.  Neurological:     General: No focal deficit present.     Mental Status: She is alert.     Cranial Nerves: No cranial nerve deficit.      No results found for any visits on 10/28/23.    The ASCVD Risk score (Arnett DK, et al., 2019) failed to calculate for the following reasons:   The 2019 ASCVD risk score is only valid for ages 46 to 69    Assessment &  Plan:   #1 recurrent falls.  She has had couple of recent falls at home.  Hit her head twice- once posteriorly and second fall frontal region.  No loss of consciousness.  CT head and cervical spine no acute findings.  Fairly large left  anterior knee abrasion without signs of secondary infection.  -PT already set up to discuss fall prevention and strengthening exercises to reduce falls. -Reviewed appropriate wound care for her left knee abrasion.  Clean daily with soap and water.  Leave off Neosporin.  Apply small amount of Vaseline to avoid drying and cracking.  Keep covered when out.  Follow-up promptly for any second ear infection -Continue walker use at this time.  #2 osteoporosis.  Patient already on Prolia every 6 months.  Also takes calcium and vitamin D regularly.  Evelena Peat, MD

## 2023-10-29 ENCOUNTER — Ambulatory Visit: Payer: Medicare Other | Attending: Family Medicine | Admitting: Physical Therapy

## 2023-10-29 ENCOUNTER — Encounter: Payer: Self-pay | Admitting: Physical Therapy

## 2023-10-29 DIAGNOSIS — M542 Cervicalgia: Secondary | ICD-10-CM | POA: Insufficient documentation

## 2023-10-29 DIAGNOSIS — M6281 Muscle weakness (generalized): Secondary | ICD-10-CM | POA: Insufficient documentation

## 2023-10-29 DIAGNOSIS — R2689 Other abnormalities of gait and mobility: Secondary | ICD-10-CM | POA: Insufficient documentation

## 2023-10-29 DIAGNOSIS — M5459 Other low back pain: Secondary | ICD-10-CM | POA: Diagnosis not present

## 2023-10-29 DIAGNOSIS — R2681 Unsteadiness on feet: Secondary | ICD-10-CM | POA: Diagnosis not present

## 2023-10-29 DIAGNOSIS — R293 Abnormal posture: Secondary | ICD-10-CM | POA: Insufficient documentation

## 2023-10-29 NOTE — Therapy (Signed)
 OUTPATIENT PHYSICAL THERAPY NEURO TREATMENT NOTE   Patient Name: [name] MRN: [id number] DOB:03-May-1941, 83 y.o., female Today's Date: 10/29/2023   PCP: Micheal Wolm LELON, MD  REFERRING PROVIDER: Micheal Wolm LELON, MD   END OF SESSION:  PT End of Session - 10/29/23 1104     Visit Number 2    Number of Visits 17    Date for PT Re-Evaluation 12/12/23    Authorization Type Medicare/Tricare    PT Start Time 1106    PT Stop Time 1146    PT Time Calculation (min) 40 min    Equipment Utilized During Treatment Gait belt    Activity Tolerance Patient tolerated treatment well    Behavior During Therapy WFL for tasks assessed/performed              Past Medical History:  Diagnosis Date   Allergic rhinitis    Anemia    Anxiety    Bronchiectasis    Carpal tunnel syndrome 07/13/2015   Bilateral   CVA (cerebral infarction) 10/2007   Right thalamic    Diverticulosis of colon    Gait disorder    GERD (gastroesophageal reflux disease)    History of breast cancer    HTN (hypertension)    Hyperlipidemia    Idiopathic pulmonary fibrosis    Left knee DJD    Migraine    Osteoporosis    Peripheral neuropathy    PMR (polymyalgia rheumatica) (HCC)    Polyneuropathy in other diseases classified elsewhere (HCC) 02/17/2013   Previous back surgery 10/09/12   Renal insufficiency    Rheumatoid arthritis (HCC)    Seizure disorder (HCC)    Spondylosis, cervical, with myelopathy 08/31/2015   C3-4 myelopathy   Temporal arteritis (HCC)    Right eye blind, 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   Type II or unspecified type diabetes mellitus without mention of complication, not stated as uncontrolled    2nd to steriods   Vitamin D deficiency    Past Surgical History:  Procedure Laterality Date   APPENDECTOMY  01/2021   CARPAL TUNNEL RELEASE Right    CATARACT EXTRACTION     OS - Summer 2010   CERVICAL FUSION  09/24/2008   4 rods and pins in place   CHOLECYSTECTOMY      COLONOSCOPY  12/15/2011   Procedure: COLONOSCOPY;  Surgeon: Renaye Sous, MD;  Location: [facility];  Service: Endoscopy;  Laterality: N/A;   LUMBAR FUSION  04/24/2010   W/Mechanical fixation - L2-5 [facility]   LUMBAR FUSION  09/25/2011   rods in hips (to stabilize).   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) 08/25/2019   Pain management 10/30/2017   Rheumatoid arthritis (HCC) 04/25/2017   S/P cervical spinal fusion 09/05/2015   Spondylosis, cervical, with myelopathy 08/31/2015   Carpal tunnel syndrome 07/13/2015  Torn ear lobe 06/07/2015   Obesity (BMI 30-39.9) 04/06/2014   Shortness of breath 07/14/2013   Bone cyst 07/14/2013   Polyneuropathy in other diseases classified elsewhere (HCC) 02/17/2013   Chronic back pain 12/09/2012   Peripheral edema 09/25/2012   Anemia due to chronic illness 09/01/2012   DDD (degenerative disc disease), lumbosacral 08/12/2012   Cervicalgia 07/11/2012   Disturbance of skin sensation 07/11/2012   Temporal arteritis (HCC) 04/01/2012   Lower GI bleed 12/24/2011   Rectal bleed 12/13/2011   Arthrodesis status 09/04/2011   Chronic renal insufficiency, stage II (mild) 04/04/2011   LUMBAR RADICULOPATHY, LEFT 04/18/2009   Essential hypertension 12/30/2007   SEIZURE DISORDER 12/30/2007   CEREBROVASCULAR ACCIDENT, HX OF 12/30/2007   Hyperlipidemia 10/08/2007   GERD 10/08/2007    DIVERTICULOSIS, COLON 10/08/2007   BRONCHIECTASIS 06/26/2007   PULMONARY FIBROSIS 05/17/2007   Osteoporosis 05/17/2007   BREAST CANCER, HX OF 05/17/2007   MASTECTOMY, LEFT, HX OF 05/17/2007    ONSET DATE: November 2024  REFERRING DIAG: R29.898 (ICD-10-CM) - Leg weakness, bilateral  THERAPY DIAG:  Other abnormalities of gait and mobility  Unsteadiness on feet  Muscle weakness (generalized)  Rationale for Evaluation and Treatment: Rehabilitation  SUBJECTIVE:                                                                                                                                                                                             SUBJECTIVE STATEMENT: A couple of days after the initial fall, I fell forward in my bedroom and hit my head and knee.   Went to ED.  Knee is covered with bandages,  and CT scan of head/neck was clear.  Pt accompanied by: self  PERTINENT HISTORY: Anemia, CTS, CVA 2009, GERD, breast CA s/p mastectomy, HTN, HLD, Idiopathic pulmonary fibrosis, migraine, osteoporosis, peripheral neuropathy, polymyalgia rheumatica, seizures, temporal arteritis, DMII, cervical fusion 2010, lumbar fusion 2011, 2013, L TKA  PAIN:  Are you having pain? No  PRECAUTIONS: Fall and Other: osteoporosis  RED FLAGS: None   WEIGHT BEARING RESTRICTIONS: No  FALLS: Has patient fallen in last 6 months? Yes. Number of falls 2  LIVING ENVIRONMENT: Lives with: lives alone Lives in: House/apartment Stairs:  ramp entry; 1 story with 14 steps into finished attic (reports that she is afraid to go up there) Has following equipment at home: Quad cane small base, Walker - 4 wheeled, shower chair, Grab bars, and Ramped entry   PLOF: Independent and has a cleaning lady every 3 weeks; drives to [place] every weekend to assist in taking care of her sister who has dementia    PATIENT GOALS: improve  leg strength   OBJECTIVE:    TODAY'S TREATMENT: 10/29/2023 Activity Comments   Berg Balance:  27/56 Scores <45/56 indicate increased fall risk  Review of initial HEP Good form, cues for standing technique with hip abduction to prevent R Trendelenburg  Seated LAQ, 10 reps Red band  Seated  hip abduction 10 reps Red band  Gait with rollator x 85 ft, cues for upright posture         [facility] PT Assessment - 10/29/23 1112       Standardized Balance Assessment   Standardized Balance Assessment Berg Balance Test      Berg Balance Test   Sit to Stand Able to stand  independently using hands    Standing Unsupported Able to stand 2 minutes with supervision    Sitting with Back Unsupported but Feet Supported on Floor or Stool Able to sit safely and securely 2 minutes    Stand to Sit Sits safely with minimal use of hands    Transfers Able to transfer safely, definite need of hands    Standing Unsupported with Eyes Closed Able to stand 10 seconds with supervision    Standing Unsupported with Feet Together Needs help to attain position but able to stand for 30 seconds with feet together    From Standing, Reach Forward with Outstretched Arm Can reach forward >12 cm safely (5)    From Standing Position, Pick up Object from Floor Unable to try/needs assist to keep balance    From Standing Position, Turn to Look Behind Over each Shoulder Needs supervision when turning    Turn 360 Degrees Needs assistance while turning    Standing Unsupported, Alternately Place Feet on Step/Stool Needs assistance to keep from falling or unable to try    Standing Unsupported, One Foot in Front Able to take small step independently and hold 30 seconds    Standing on One Leg Unable to try or needs assist to prevent fall   1.87 LLE, unable RLE   Total Score 27    Berg comment: Scores <45/56 indicate increased fall risk            Access Code: [email or web address] URL: [email or web address] Date: 10/29/2023 Prepared by: [facility] - Outpatient  Rehab - [facility]  Exercises - Sit to  Stand with Arms Crossed  - 1 x daily - 5 x weekly - 2 sets - 10 reps - Seated March with Resistance  - 1 x daily - 5 x weekly - 2 sets - 10 reps - Seated Scapular Retraction  - 1 x daily - 5 x weekly - 2 sets - 10 reps - 3 sec hold - Standing Hip Abduction with Counter Support  - 1 x daily - 5 x weekly - 2 sets - 10 reps - Seated Hip Abduction with Resistance  - 1 x daily - 5 x weekly - 3 sets - 10 reps - Seated Knee Extension with Resistance  - 1 x daily - 5 x weekly - 3 sets - 10 reps  PATIENT EDUCATION: Education details: Berg score and fall risk, rollator is appropriate assistive device for use at ALL times; review/addition of HEP Person educated: Patient Education method: Explanation, Demonstration, and Handouts Education comprehension: verbalized understanding, returned demonstration, and needs further education -------------------------------------------------- Note: Objective measures below were completed at Evaluation unless otherwise noted.  DIAGNOSTIC FINDINGS: 09/02/23 L lower leg xray:  No acute fracture. 2. Postsurgical changes of total left knee arthroplasty without evidence of hardware failure. 3. Moderate plantar  calcaneal heel spur. 4. Possible wound within the proximal lateral calf soft tissues with patchy soft tissue sclerosis. Recommend clinical correlation. No cortical erosion is seen.  COGNITION: Overall cognitive status: Within functional limits for tasks assessed   SENSATION: Reports N/T in B feet  COORDINATION: Alternating pronation/supination: WNL B Alternating toe tap: WNL B Finger to nose: WNL B  POSTURE:  elevated shoulders, rounded posture, L lateral trunk flexion with R hip deviated further R ; fallen arch on L foot  LOWER EXTREMITY ROM:     Active  Right Eval Left Eval  Hip flexion    Hip extension    Hip abduction    Hip adduction    Hip internal rotation    Hip external rotation    Knee flexion    Knee extension    Ankle dorsiflexion  23 9  Ankle plantarflexion    Ankle inversion    Ankle eversion     (Blank rows = not tested)  LOWER EXTREMITY MMT:    MMT (in sitting) Right Eval Left Eval  Hip flexion 4+ 3+  Hip extension    Hip abduction 4 4  Hip adduction 4+ 4  Hip internal rotation    Hip external rotation    Knee flexion 4 4  Knee extension 4+ 4  Ankle dorsiflexion 4+ 4  Ankle plantarflexion 4 3+  Ankle inversion    Ankle eversion    (Blank rows = not tested)   GAIT: Gait pattern: R hip instability and hip drop,  heavy anterior and L lean on walker Assistive device utilized: Walker - 4 wheeled Level of assistance: Modified independence   FUNCTIONAL TESTS:   [facility] PT Assessment - 10/29/23 1112       Standardized Balance Assessment   Standardized Balance Assessment Berg Balance Test      Berg Balance Test   Sit to Stand Able to stand  independently using hands    Standing Unsupported Able to stand 2 minutes with supervision    Sitting with Back Unsupported but Feet Supported on Floor or Stool Able to sit safely and securely 2 minutes    Stand to Sit Sits safely with minimal use of hands    Transfers Able to transfer safely, definite need of hands    Standing Unsupported with Eyes Closed Able to stand 10 seconds with supervision    Standing Unsupported with Feet Together Needs help to attain position but able to stand for 30 seconds with feet together    From Standing, Reach Forward with Outstretched Arm Can reach forward >12 cm safely (5)    From Standing Position, Pick up Object from Floor Unable to try/needs assist to keep balance    From Standing Position, Turn to Look Behind Over each Shoulder Needs supervision when turning    Turn 360 Degrees Needs assistance while turning    Standing Unsupported, Alternately Place Feet on Step/Stool Needs assistance to keep from falling or unable to try    Standing Unsupported, One Foot in Front Able to take small step independently and hold 30 seconds     Standing on One Leg Unable to try or needs assist to prevent fall   1.87 LLE, unable RLE   Total Score 27    Berg comment: Scores <45/56 indicate increased fall risk            2.38 ft/sec  TREATMENT DATE: 10/17/23     PATIENT EDUCATION: Education details: discussion on possible life alert in case of falls, prognosis, POC, HEP Person educated: Patient Education method: Explanation, Demonstration, Tactile cues, Verbal cues, and Handouts Education comprehension: verbalized understanding and returned demonstration  HOME EXERCISE PROGRAM: Access Code: [email or web address] URL: [email or web address] Date: 10/17/2023 Prepared by: [facility] - Outpatient  Rehab - [facility]  Exercises - Sit to Stand with Arms Crossed  - 1 x daily - 5 x weekly - 2 sets - 10 reps - Seated March with Resistance  - 1 x daily - 5 x weekly - 2 sets - 10 reps - Seated Scapular Retraction  - 1 x daily - 5 x weekly - 2 sets - 10 reps - 3 sec hold - Standing Hip Abduction with Counter Support  - 1 x daily - 5 x weekly - 2 sets - 10 reps  GOALS: Goals reviewed with patient? Yes  SHORT TERM GOALS: Target date: 11/14/2023  Patient to be independent with initial HEP. Baseline: HEP initiated Goal status: IN PROGRESS    LONG TERM GOALS: Target date: 12/12/2023  Patient to be independent with advanced HEP. Baseline: Not yet initiated  Goal status: IN PROGRESS  Patient to demonstrate B LE strength >/=4/5.  Baseline: See above Goal status: IN PROGRESS  Patient to report understanding of posture and body mechanics education for management of neck and LBP.  Baseline: not yet initiated  Goal status: IN PROGRESS  Patient to report putting plans to action in getting a life alert or other emergency communication system in case of falls.  Baseline: not yet in effect Goal  status: IN PROGRESS  Patient to demonstrate gait speed of at least 2.63 ft/sec in order to improve access to community.   Baseline: 2.3 ft/sec Goal status: IN PROGRESS  Patient to demonstrate 5xSTS test in <15 sec without use of UEs and with good eccentric control in order to decrease risk of falls.  Baseline: 13.73 sec pulling up on walker and with decreased eccentric control Goal status: IN PROGRESS  Patient to score at least 46/56 on Berg in order to decrease risk of falls.  Baseline: NT Goal status: IN PROGRESS  ASSESSMENT:  CLINICAL IMPRESSION: Pt presents today with reports of additional fall and ED visit since PT eval.  CT scans were clear and pt reports no additional precautions.  Skilled PT session focused on assessment of Berg, indicating increased fall risk with score of 27/56.  Also focused on review of and additions to HEP for leg strengthening.  She has significant Trendelenburg pattern on RLE with attempts at SLS. Pt needs UE support throughout standing exercises.  Educated pt in use of rollator at all times for safety with gait in the home.  Pt will continue to benefit from skilled PT towards goals for improved functional mobility and decreased fall risk.   OBJECTIVE IMPAIRMENTS: Abnormal gait, decreased activity tolerance, decreased balance, difficulty walking, decreased ROM, decreased strength, increased muscle spasms, impaired flexibility, postural dysfunction, and pain.   ACTIVITY LIMITATIONS: carrying, lifting, bending, sitting, standing, squatting, stairs, transfers, bathing, toileting, dressing, reach over head, hygiene/grooming, and locomotion level  PARTICIPATION LIMITATIONS: meal prep, cleaning, laundry, driving, shopping, community activity, and church  PERSONAL FACTORS: Age, Behavior pattern, Past/current experiences, Time since onset of injury/illness/exacerbation, and 3+ comorbidities: Anemia, CTS, CVA 2009, GERD, breast CA s/p mastectomy, HTN, HLD, Idiopathic  pulmonary fibrosis, migraine, osteoporosis, peripheral neuropathy, polymyalgia rheumatica, seizures, temporal arteritis, DMII, cervical fusion 2010, lumbar fusion 2011,  2013, L TKA  are also affecting patient's functional outcome.   REHAB POTENTIAL: Good  CLINICAL DECISION MAKING: Evolving/moderate complexity  EVALUATION COMPLEXITY: Moderate  PLAN:  PT FREQUENCY: 2x/week  PT DURATION: 8 weeks  PLANNED INTERVENTIONS: 97164- PT Re-evaluation, 97110-Therapeutic exercises, 97530- Therapeutic activity, 97112- Neuromuscular re-education, 97535- Self Care, [id number]- Manual therapy, [phone]- Gait training, [phone]- Aquatic Therapy, 97014- Electrical stimulation (unattended), Patient/Family education, Balance training, Stair training, Taping, Dry Needling, Joint mobilization, Spinal mobilization, Vestibular training, Cryotherapy, and Moist heat  PLAN FOR NEXT SESSION: Review HEP, progress BLE strengthening including gluts and abductors, postural strengthening and stretching to address L trunk lean    Greig Anon, PT 10/29/23 11:51 AM Phone: [phone] Fax: [phone]  [facility] at [facility] [address], [address] [place], [place] [address] Phone # [phone] Fax # [phone]

## 2023-10-30 NOTE — Therapy (Signed)
 OUTPATIENT PHYSICAL THERAPY NEURO TREATMENT NOTE   Patient Name: [name] MRN: [id number] DOB:1941/06/22, 83 y.o., female Today's Date: 10/31/2023   PCP: Micheal Wolm LELON, MD  REFERRING PROVIDER: Micheal Wolm LELON, MD   END OF SESSION:  PT End of Session - 10/31/23 1124     Visit Number 3    Number of Visits 17    Date for PT Re-Evaluation 12/12/23    Authorization Type Medicare/Tricare    PT Start Time 1038    PT Stop Time 1125    PT Time Calculation (min) 47 min    Activity Tolerance Patient tolerated treatment well    Behavior During Therapy WFL for tasks assessed/performed               Past Medical History:  Diagnosis Date   Allergic rhinitis    Anemia    Anxiety    Bronchiectasis    Carpal tunnel syndrome 07/13/2015   Bilateral   CVA (cerebral infarction) 10/2007   Right thalamic    Diverticulosis of colon    Gait disorder    GERD (gastroesophageal reflux disease)    History of breast cancer    HTN (hypertension)    Hyperlipidemia    Idiopathic pulmonary fibrosis    Left knee DJD    Migraine    Osteoporosis    Peripheral neuropathy    PMR (polymyalgia rheumatica) (HCC)    Polyneuropathy in other diseases classified elsewhere (HCC) 02/17/2013   Previous back surgery 10/09/12   Renal insufficiency    Rheumatoid arthritis (HCC)    Seizure disorder (HCC)    Spondylosis, cervical, with myelopathy 08/31/2015   C3-4 myelopathy   Temporal arteritis (HCC)    Right eye blind, 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   Type II or unspecified type diabetes mellitus without mention of complication, not stated as uncontrolled    2nd to steriods   Vitamin D deficiency    Past Surgical History:  Procedure Laterality Date   APPENDECTOMY  01/2021   CARPAL TUNNEL RELEASE Right    CATARACT EXTRACTION     OS - Summer 2010   CERVICAL FUSION  09/24/2008   4 rods and pins in place   CHOLECYSTECTOMY     COLONOSCOPY  12/15/2011   Procedure: COLONOSCOPY;   Surgeon: Renaye Sous, MD;  Location: [facility];  Service: Endoscopy;  Laterality: N/A;   LUMBAR FUSION  04/24/2010   W/Mechanical fixation - L2-5 [facility]   LUMBAR FUSION  09/25/2011   rods in hips (to stabilize).   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) 08/25/2019   Pain management 10/30/2017   Rheumatoid arthritis (HCC) 04/25/2017   S/P cervical spinal fusion 09/05/2015   Spondylosis, cervical, with myelopathy 08/31/2015   Carpal tunnel syndrome 07/13/2015   Torn ear lobe 06/07/2015   Obesity (BMI  30-39.9) 04/06/2014   Shortness of breath 07/14/2013   Bone cyst 07/14/2013   Polyneuropathy in other diseases classified elsewhere (HCC) 02/17/2013   Chronic back pain 12/09/2012   Peripheral edema 09/25/2012   Anemia due to chronic illness 09/01/2012   DDD (degenerative disc disease), lumbosacral 08/12/2012   Cervicalgia 07/11/2012   Disturbance of skin sensation 07/11/2012   Temporal arteritis (HCC) 04/01/2012   Lower GI bleed 12/24/2011   Rectal bleed 12/13/2011   Arthrodesis status 09/04/2011   Chronic renal insufficiency, stage II (mild) 04/04/2011   LUMBAR RADICULOPATHY, LEFT 04/18/2009   Essential hypertension 12/30/2007   SEIZURE DISORDER 12/30/2007   CEREBROVASCULAR ACCIDENT, HX OF 12/30/2007   Hyperlipidemia 10/08/2007   GERD 10/08/2007   DIVERTICULOSIS, COLON 10/08/2007   BRONCHIECTASIS  06/26/2007   PULMONARY FIBROSIS 05/17/2007   Osteoporosis 05/17/2007   BREAST CANCER, HX OF 05/17/2007   MASTECTOMY, LEFT, HX OF 05/17/2007    ONSET DATE: November 2024  REFERRING DIAG: R29.898 (ICD-10-CM) - Leg weakness, bilateral  THERAPY DIAG:  Other abnormalities of gait and mobility  Unsteadiness on feet  Muscle weakness (generalized)  Abnormal posture  Cervicalgia  Other low back pain  Rationale for Evaluation and Treatment: Rehabilitation  SUBJECTIVE:                                                                                                                                                                                             SUBJECTIVE STATEMENT: Had another fall yesterday- was standing to using her leg to use a towel to clean up a spilled drink. Reports that she hit the R side of her head but not very hard and also the R hip, and cut the R shin. Reports that the R hip is sore (reports that she has a big bruise on the R hip). Reports having HA's and dizziness since her 1st fall (in January) but denies changes since this fall yesterday.   Pt accompanied by: self  PERTINENT HISTORY: Anemia, CTS, CVA 2009, GERD, breast CA s/p mastectomy, HTN, HLD, Idiopathic pulmonary fibrosis, migraine, osteoporosis, peripheral neuropathy, polymyalgia rheumatica, seizures, temporal arteritis, DMII, cervical fusion 2010, lumbar fusion 2011, 2013, L TKA  PAIN:  Are you having pain? Yes: NPRS scale: 4/10 Pain location: R hip Pain description: sore Aggravating factors: siting on it, touching it Relieving factors: nothing  PRECAUTIONS: Fall and Other: osteoporosis  RED FLAGS: None   WEIGHT BEARING RESTRICTIONS: No  FALLS: Has patient fallen in last 6 months? Yes. Number of falls 2  LIVING ENVIRONMENT: Lives with: lives alone Lives in: House/apartment Stairs:  ramp entry; 1 story with 14 steps into finished  attic (reports that she is afraid to go up there) Has  following equipment at home: Counselling psychologist, Walker - 4 wheeled, shower chair, Grab bars, and Ramped entry   PLOF: Independent and has a cleaning lady every 3 weeks; drives to [place] every weekend to assist in taking care of her sister who has dementia    PATIENT GOALS: improve leg strength   OBJECTIVE:     TODAY'S TREATMENT: 10/31/23 Activity Comments  Application of ice pack to R hip   Gait training and transfers with 4WW for safety  Edu on proper use of brakes, reaching for armrests with transfers, cueing to maintain elbows bent when ambulating to prevent pushing walker too far forward     PATIENT EDUCATION: Education details: edu on ice pack to R hip 10 min at a time; edu on fall prevention handout; discussed of possibility of having a caregiver in the home as she reports concern about moving closer to her son; readministered HEP as pt reports losing handouts and advised ok to discontinue exercises if painful to R hip; encouraged purchasing life alert or apple watch for safety d/t pt's  Person educated: Patient Education method: Explanation, Demonstration, Tactile cues, Verbal cues, and Handouts Education comprehension: verbalized understanding and returned demonstration  Access Code: [email or web address] URL: [email or web address] Date: 10/29/2023 Prepared by: [facility] - Outpatient  Rehab - [facility]  Exercises - Sit to Stand with Arms Crossed  - 1 x daily - 5 x weekly - 2 sets - 10 reps - Seated March with Resistance  - 1 x daily - 5 x weekly - 2 sets - 10 reps - Seated Scapular Retraction  - 1 x daily - 5 x weekly - 2 sets - 10 reps - 3 sec hold - Standing Hip Abduction with Counter Support  - 1 x daily - 5 x weekly - 2 sets - 10 reps - Seated Hip Abduction with Resistance  - 1 x daily - 5 x weekly - 3 sets - 10 reps - Seated Knee Extension with Resistance  - 1 x daily - 5 x weekly - 3 sets - 10  reps    -------------------------------------------------- Note: Objective measures below were completed at Evaluation unless otherwise noted.  DIAGNOSTIC FINDINGS: 09/02/23 L lower leg xray:  No acute fracture. 2. Postsurgical changes of total left knee arthroplasty without evidence of hardware failure. 3. Moderate plantar calcaneal heel spur. 4. Possible wound within the proximal lateral calf soft tissues with patchy soft tissue sclerosis. Recommend clinical correlation. No cortical erosion is seen.  COGNITION: Overall cognitive status: Within functional limits for tasks assessed   SENSATION: Reports N/T in B feet  COORDINATION: Alternating pronation/supination: WNL B Alternating toe tap: WNL B Finger to nose: WNL B  POSTURE:  elevated shoulders, rounded posture, L lateral trunk flexion with R hip deviated further R ; fallen arch on L foot  LOWER EXTREMITY ROM:     Active  Right Eval Left Eval  Hip flexion    Hip extension    Hip abduction    Hip adduction    Hip internal rotation    Hip external rotation    Knee flexion    Knee extension    Ankle dorsiflexion 23 9  Ankle plantarflexion    Ankle inversion    Ankle eversion     (Blank rows = not tested)  LOWER EXTREMITY MMT:    MMT (in sitting) Right Eval Left Eval  Hip flexion 4+ 3+  Hip  extension    Hip abduction 4 4  Hip adduction 4+ 4  Hip internal rotation    Hip external rotation    Knee flexion 4 4  Knee extension 4+ 4  Ankle dorsiflexion 4+ 4  Ankle plantarflexion 4 3+  Ankle inversion    Ankle eversion    (Blank rows = not tested)   GAIT: Gait pattern: R hip instability and hip drop,  heavy anterior and L lean on walker Assistive device utilized: Walker - 4 wheeled Level of assistance: Modified independence   FUNCTIONAL TESTS:    2.38 ft/sec                                                                                                                               TREATMENT  DATE: 10/17/23     PATIENT EDUCATION: Education details: discussion on possible life alert in case of falls, prognosis, POC, HEP Person educated: Patient Education method: Explanation, Demonstration, Tactile cues, Verbal cues, and Handouts Education comprehension: verbalized understanding and returned demonstration  HOME EXERCISE PROGRAM: Access Code: [email or web address] URL: [email or web address] Date: 10/17/2023 Prepared by: [facility] - Outpatient  Rehab - [facility]  Exercises - Sit to Stand with Arms Crossed  - 1 x daily - 5 x weekly - 2 sets - 10 reps - Seated March with Resistance  - 1 x daily - 5 x weekly - 2 sets - 10 reps - Seated Scapular Retraction  - 1 x daily - 5 x weekly - 2 sets - 10 reps - 3 sec hold - Standing Hip Abduction with Counter Support  - 1 x daily - 5 x weekly - 2 sets - 10 reps  GOALS: Goals reviewed with patient? Yes  SHORT TERM GOALS: Target date: 11/14/2023  Patient to be independent with initial HEP. Baseline: HEP initiated Goal status: IN PROGRESS    LONG TERM GOALS: Target date: 12/12/2023  Patient to be independent with advanced HEP. Baseline: Not yet initiated  Goal status: IN PROGRESS  Patient to demonstrate B LE strength >/=4/5.  Baseline: See above Goal status: IN PROGRESS  Patient to report understanding of posture and body mechanics education for management of neck and LBP.  Baseline: not yet initiated  Goal status: IN PROGRESS  Patient to report putting plans to action in getting a life alert or other emergency communication system in case of falls.  Baseline: not yet in effect; discussed again 10/31/23 Goal status: IN PROGRESS  Patient to demonstrate gait speed of at least 2.63 ft/sec in order to improve access to community.   Baseline: 2.3 ft/sec Goal status: IN PROGRESS  Patient to demonstrate 5xSTS test in <15 sec without use of UEs and with good eccentric control in order to decrease risk of falls.  Baseline:  13.73 sec pulling up on walker and with decreased eccentric control Goal status: IN PROGRESS  Patient to score at least 46/56 on Berg in order to decrease risk of  falls.  Baseline: NT Goal status: IN PROGRESS  ASSESSMENT:  CLINICAL IMPRESSION: Patient arrived to session with report of another fall that occurred yesterday, resulting in bruised R hip and hitting the R side of the head. Patient denies changes in symptoms since this recent fall with exception of R hip soreness. Today's session focused on education on safety with walker, safety information for the home, getting a life alert or similar device d/t patient's goal to age in place. Patient will require continued cues/reminders for safe use of walker to reduce falls risk. May trial use of RW in future session. Patient reported understanding of all edu provided and denied complaints upon leaving.   OBJECTIVE IMPAIRMENTS: Abnormal gait, decreased activity tolerance, decreased balance, difficulty walking, decreased ROM, decreased strength, increased muscle spasms, impaired flexibility, postural dysfunction, and pain.   ACTIVITY LIMITATIONS: carrying, lifting, bending, sitting, standing, squatting, stairs, transfers, bathing, toileting, dressing, reach over head, hygiene/grooming, and locomotion level  PARTICIPATION LIMITATIONS: meal prep, cleaning, laundry, driving, shopping, community activity, and church  PERSONAL FACTORS: Age, Behavior pattern, Past/current experiences, Time since onset of injury/illness/exacerbation, and 3+ comorbidities: Anemia, CTS, CVA 2009, GERD, breast CA s/p mastectomy, HTN, HLD, Idiopathic pulmonary fibrosis, migraine, osteoporosis, peripheral neuropathy, polymyalgia rheumatica, seizures, temporal arteritis, DMII, cervical fusion 2010, lumbar fusion 2011, 2013, L TKA  are also affecting patient's functional outcome.   REHAB POTENTIAL: Good  CLINICAL DECISION MAKING: Evolving/moderate complexity  EVALUATION  COMPLEXITY: Moderate  PLAN:  PT FREQUENCY: 2x/week  PT DURATION: 8 weeks  PLANNED INTERVENTIONS: 97164- PT Re-evaluation, 97110-Therapeutic exercises, 97530- Therapeutic activity, 97112- Neuromuscular re-education, 97535- Self Care, 02859- Manual therapy, [phone]- Gait training, [phone]- Aquatic Therapy, 97014- Electrical stimulation (unattended), Patient/Family education, Balance training, Stair training, Taping, Dry Needling, Joint mobilization, Spinal mobilization, Vestibular training, Cryotherapy, and Moist heat  PLAN FOR NEXT SESSION: f/u on life alert of PCA in the home? Trial RW? Review HEP, progress BLE strengthening including gluts and abductors, postural strengthening and stretching to address L trunk lean    Louana Terrilyn Christians, PT, DPT 10/31/23 11:29 AM  [facility] at [facility] [address] [place], [address] [place], [place] [address] Phone # [phone] Fax # [phone]

## 2023-10-31 ENCOUNTER — Ambulatory Visit: Payer: Medicare Other | Admitting: Physical Therapy

## 2023-10-31 ENCOUNTER — Encounter: Payer: Self-pay | Admitting: Physical Therapy

## 2023-10-31 DIAGNOSIS — R2689 Other abnormalities of gait and mobility: Secondary | ICD-10-CM

## 2023-10-31 DIAGNOSIS — R293 Abnormal posture: Secondary | ICD-10-CM

## 2023-10-31 DIAGNOSIS — M542 Cervicalgia: Secondary | ICD-10-CM

## 2023-10-31 DIAGNOSIS — M5459 Other low back pain: Secondary | ICD-10-CM | POA: Diagnosis not present

## 2023-10-31 DIAGNOSIS — M6281 Muscle weakness (generalized): Secondary | ICD-10-CM

## 2023-10-31 DIAGNOSIS — R2681 Unsteadiness on feet: Secondary | ICD-10-CM | POA: Diagnosis not present

## 2023-10-31 NOTE — Patient Instructions (Addendum)
 Fall Prevention and Home Safety Falls cause injuries and can affect all age groups. It is possible to use preventive measures to significantly decrease the likelihood of falls. There are many simple measures which can make your home safer and prevent falls. OUTDOORS Repair cracks and edges of walkways and driveways. Remove high doorway thresholds. Trim shrubbery on the main path into your home. Have good outside lighting. Clear walkways of tools, rocks, debris, and clutter. Check that handrails are not broken and are securely fastened. Both sides of steps should have handrails. Have leaves, snow, and ice cleared regularly. Use sand or salt on walkways during winter months. In the garage, clean up grease or oil spills. BATHROOM Install night lights. Install grab bars by the toilet and in the tub and shower. Use non-skid mats or decals in the tub or shower. Place a plastic non-slip stool in the shower to sit on, if needed. Keep floors dry and clean up all water on the floor immediately. Remove soap buildup in the tub or shower on a regular basis. Secure bath mats with non-slip, double-sided rug tape. Remove throw rugs and tripping hazards from the floors. BEDROOMS Install night lights. Make sure a bedside light is easy to reach. Do not use oversized bedding. Keep a telephone by your bedside. Have a firm chair with side arms to use for getting dressed. Remove throw rugs and tripping hazards from the floor. KITCHEN Keep handles on pots and pans turned toward the center of the stove. Use back burners when possible. Clean up spills quickly and allow time for drying. Avoid walking on wet floors. Avoid hot utensils and knives. Position shelves so they are not too high or low. Place commonly used objects within easy reach. If necessary, use a sturdy step stool with a grab bar when reaching. Keep electrical cables out of the way. Do not use floor polish or wax that makes floors slippery.  If you must use wax, use non-skid floor wax. Remove throw rugs and tripping hazards from the floor. STAIRWAYS Never leave objects on stairs. Place handrails on both sides of stairways and use them. Fix any loose handrails. Make sure handrails on both sides of the stairways are as long as the stairs. Check carpeting to make sure it is firmly attached along stairs. Make repairs to worn or loose carpet promptly. Avoid placing throw rugs at the top or bottom of stairways, or properly secure the rug with carpet tape to prevent slippage. Get rid of throw rugs, if possible. Have an electrician put in a light switch at the top and bottom of the stairs. OTHER FALL PREVENTION TIPS Wear low-heel or rubber-soled shoes that are supportive and fit well. Wear closed toe shoes. When using a stepladder, make sure it is fully opened and both spreaders are firmly locked. Do not climb a closed stepladder. Add color or contrast paint or tape to grab bars and handrails in your home. Place contrasting color strips on first and last steps. Learn and use mobility aids as needed. Install an electrical emergency response system. Turn on lights to avoid dark areas. Replace light bulbs that burn out immediately. Get light switches that glow. Arrange furniture to create clear pathways. Keep furniture in the same place. Firmly attach carpet with non-skid or double-sided tape. Eliminate uneven floor surfaces. Select a carpet pattern that does not visually hide the edge of steps. Be aware of all pets. OTHER HOME SAFETY TIPS Set the water temperature for 120 F (48.8 C). Keep  emergency numbers on or near the telephone. Keep smoke detectors on every level of the home and near sleeping areas. Document Released: 08/31/2002 Document Revised: 03/11/2012 Document Reviewed: 11/30/2011 [facility] Patient Information 2015 [place], [place]. This information is not intended to replace advice given to you by your health care provider. Make  sure you discuss any questions you have with your health care provider.    ---------------------   [name], today we talked about: Safe use of your walker brakes Getting a life alert of apple watch Looking into getting a personal care assistant  Using ice on your R hip for 10 minutes at a time

## 2023-11-05 ENCOUNTER — Encounter: Payer: Self-pay | Admitting: Physical Therapy

## 2023-11-05 ENCOUNTER — Ambulatory Visit: Payer: Medicare Other | Admitting: Physical Therapy

## 2023-11-05 DIAGNOSIS — M542 Cervicalgia: Secondary | ICD-10-CM | POA: Diagnosis not present

## 2023-11-05 DIAGNOSIS — R293 Abnormal posture: Secondary | ICD-10-CM | POA: Diagnosis not present

## 2023-11-05 DIAGNOSIS — R2689 Other abnormalities of gait and mobility: Secondary | ICD-10-CM

## 2023-11-05 DIAGNOSIS — R2681 Unsteadiness on feet: Secondary | ICD-10-CM | POA: Diagnosis not present

## 2023-11-05 DIAGNOSIS — M6281 Muscle weakness (generalized): Secondary | ICD-10-CM | POA: Diagnosis not present

## 2023-11-05 DIAGNOSIS — M5459 Other low back pain: Secondary | ICD-10-CM | POA: Diagnosis not present

## 2023-11-05 NOTE — Therapy (Signed)
OUTPATIENT PHYSICAL THERAPY NEURO TREATMENT NOTE   Patient Name: [name] MRN: [id number] DOB:07/16/1941, 83 y.o., female Today's Date: 11/05/2023   PCP: Kristian Covey, MD  REFERRING PROVIDER: Kristian Covey, MD   END OF SESSION:  PT End of Session - 11/05/23 1112     Visit Number 4    Number of Visits 17    Date for PT Re-Evaluation 12/12/23    Authorization Type Medicare/Tricare    PT Start Time 1111   Pt in restroom   PT Stop Time 1151    PT Time Calculation (min) 40 min    Equipment Utilized During Treatment Gait belt    Activity Tolerance Patient tolerated treatment well    Behavior During Therapy WFL for tasks assessed/performed                Past Medical History:  Diagnosis Date   Allergic rhinitis    Anemia    Anxiety    Bronchiectasis    Carpal tunnel syndrome 07/13/2015   Bilateral   CVA (cerebral infarction) 10/2007   Right thalamic    Diverticulosis of colon    Gait disorder    GERD (gastroesophageal reflux disease)    History of breast cancer    HTN (hypertension)    Hyperlipidemia    Idiopathic pulmonary fibrosis    Left knee DJD    Migraine    Osteoporosis    Peripheral neuropathy    PMR (polymyalgia rheumatica) (HCC)    Polyneuropathy in other diseases classified elsewhere (HCC) 02/17/2013   Previous back surgery 10/09/12   Renal insufficiency    Rheumatoid arthritis (HCC)    Seizure disorder (HCC)    Spondylosis, cervical, with myelopathy 08/31/2015   C3-4 myelopathy   Temporal arteritis (HCC)    Right eye blind, on steroids per [facility]/ Dr Anne Hahn   Type II or unspecified type diabetes mellitus without mention of complication, not stated as uncontrolled    2nd to steriods   Vitamin D deficiency    Past Surgical History:  Procedure Laterality Date   APPENDECTOMY  01/2021   CARPAL TUNNEL RELEASE Right    CATARACT EXTRACTION     OS - Summer 2010   CERVICAL FUSION  09/24/2008   4 rods and pins in place    CHOLECYSTECTOMY     COLONOSCOPY  12/15/2011   Procedure: COLONOSCOPY;  Surgeon: Charna Elizabeth, MD;  Location: [facility];  Service: Endoscopy;  Laterality: N/A;   LUMBAR FUSION  04/24/2010   W/Mechanical fixation - L2-5 [facility]   LUMBAR FUSION  09/25/2011   rods in hips (to stabilize).   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) 08/25/2019   Pain management 10/30/2017   Rheumatoid arthritis (HCC) 04/25/2017   S/P cervical spinal fusion 09/05/2015   Spondylosis, cervical, with myelopathy 08/31/2015  Carpal tunnel syndrome 07/13/2015   Torn ear lobe 06/07/2015   Obesity (BMI 30-39.9) 04/06/2014   Shortness of breath 07/14/2013   Bone cyst 07/14/2013   Polyneuropathy in other diseases classified elsewhere (HCC) 02/17/2013   Chronic back pain 12/09/2012   Peripheral edema 09/25/2012   Anemia due to chronic illness 09/01/2012   DDD (degenerative disc disease), lumbosacral 08/12/2012   Cervicalgia 07/11/2012   Disturbance of skin sensation 07/11/2012   Temporal arteritis (HCC) 04/01/2012   Lower GI bleed 12/24/2011   Rectal bleed 12/13/2011   Arthrodesis status 09/04/2011   Chronic renal insufficiency, stage II (mild) 04/04/2011   LUMBAR RADICULOPATHY, LEFT 04/18/2009   Essential hypertension 12/30/2007   SEIZURE DISORDER 12/30/2007   CEREBROVASCULAR ACCIDENT, HX OF 12/30/2007   Hyperlipidemia 10/08/2007   GERD  10/08/2007   DIVERTICULOSIS, COLON 10/08/2007   BRONCHIECTASIS 06/26/2007   PULMONARY FIBROSIS 05/17/2007   Osteoporosis 05/17/2007   BREAST CANCER, HX OF 05/17/2007   MASTECTOMY, LEFT, HX OF 05/17/2007    ONSET DATE: November 2024  REFERRING DIAG: R29.898 (ICD-10-CM) - Leg weakness, bilateral  THERAPY DIAG:  Other abnormalities of gait and mobility  Unsteadiness on feet  Muscle weakness (generalized)  Abnormal posture  Rationale for Evaluation and Treatment: Rehabilitation  SUBJECTIVE:                                                                                                                                                                                             SUBJECTIVE STATEMENT: Had two falls over the weekend.  One was trying to help sister stand up and she lost balance, causing [place] both to fall.  Second fall was while hugging sister and both fell on the couch.  Neither fall caused injury.  Thinking about getting a watch for life alert.  Pt accompanied by: self  PERTINENT HISTORY: Anemia, CTS, CVA 2009, GERD, breast CA s/p mastectomy, HTN, HLD, Idiopathic pulmonary fibrosis, migraine, osteoporosis, peripheral neuropathy, polymyalgia rheumatica, seizures, temporal arteritis, DMII, cervical fusion 2010, lumbar fusion 2011, 2013, L TKA  PAIN:  Are you having pain? Yes: NPRS scale: 4/10 Pain location: R hip Pain description: sore Aggravating factors: siting on it, touching it Relieving factors: nothing  PRECAUTIONS: Fall and Other: osteoporosis ; low vision R eye  RED FLAGS: None   WEIGHT BEARING RESTRICTIONS: No  FALLS: Has patient fallen in last 6 months? Yes. Number of falls 2  LIVING ENVIRONMENT: Lives with: lives alone Lives in: House/apartment Stairs:  ramp entry; 1 story with 14 steps into finished attic (reports that she is afraid to go up there) Has following equipment at home: Quad cane small  base, Walker - 4 wheeled, shower chair, Grab bars,  and Ramped entry   PLOF: Independent and has a cleaning lady every 3 weeks; drives to [place] every weekend to assist in taking care of her sister who has dementia    PATIENT GOALS: improve leg strength   OBJECTIVE:   Reviewed mechanisms of recent falls (no new injury), and discussed/reviewed plans for life alert or having cell phone with her at all times in case of injury with a fall.  TODAY'S TREATMENT: 11/05/2023 Activity Comments  Heel/toe raises x 10, then 2nd set of 10 with hip motion for hip strategy Begins to have back pain with posterior hip strategy  Side step and weightshift 10 reps each side BUE support  Forward step and weightshift x 10 reps BUE support  Back step and weightshift x 10 reps BUE support  Forward>back step and weightshift 10 reps BUE>1 UE support-pt very unstable with RLE as stance, needs UE support     Access Code: [email or web address] URL: [email or web address] Date: 11/05/2023 Prepared by: [facility] - Outpatient  Rehab - [facility]  Exercises - Sit to Stand with Arms Crossed  - 1 x daily - 5 x weekly - 2 sets - 10 reps - Seated March with Resistance  - 1 x daily - 5 x weekly - 2 sets - 10 reps - Seated Scapular Retraction  - 1 x daily - 5 x weekly - 2 sets - 10 reps - 3 sec hold - Standing Hip Abduction with Counter Support  - 1 x daily - 5 x weekly - 2 sets - 10 reps - Seated Hip Abduction with Resistance  - 1 x daily - 5 x weekly - 3 sets - 10 reps - Seated Knee Extension with Resistance  - 1 x daily - 5 x weekly - 3 sets - 10 reps - Alternating Step forward-Balance Reaction  - 1 x daily - 7 x weekly - 1-2 sets - 5 reps   PATIENT EDUCATION: Education details: Reviewed mechanisms of recent falls and reiterated benefit for Apple watch/Life Alert/use of cell phone on her person at all times in case of fall with injury; discussed rationale for ankle/hip/step strategy work and how to safely perform exercises at home Person educated:  Patient Education method: Explanation, Demonstration, Tactile cues, Verbal cues, and Handouts Education comprehension: verbalized understanding and returned demonstration   -------------------------------------------------- Note: Objective measures below were completed at Evaluation unless otherwise noted.  DIAGNOSTIC FINDINGS: 09/02/23 L lower leg xray:  No acute fracture. 2. Postsurgical changes of total left knee arthroplasty without evidence of hardware failure. 3. Moderate plantar calcaneal heel spur. 4. Possible wound within the proximal lateral calf soft tissues with patchy soft tissue sclerosis. Recommend clinical correlation. No cortical erosion is seen.  COGNITION: Overall cognitive status: Within functional limits for tasks assessed   SENSATION: Reports N/T in B feet  COORDINATION: Alternating pronation/supination: WNL B Alternating toe tap: WNL B Finger to nose: WNL B  POSTURE:  elevated shoulders, rounded posture, L lateral trunk flexion with R hip deviated further R ; fallen arch on L foot  LOWER EXTREMITY ROM:     Active  Right Eval Left Eval  Hip flexion    Hip extension    Hip abduction    Hip adduction    Hip internal rotation    Hip external rotation    Knee flexion    Knee extension    Ankle dorsiflexion 23 9  Ankle plantarflexion    Ankle  inversion    Ankle eversion     (Blank rows = not tested)  LOWER EXTREMITY MMT:    MMT (in sitting) Right Eval Left Eval  Hip flexion 4+ 3+  Hip extension    Hip abduction 4 4  Hip adduction 4+ 4  Hip internal rotation    Hip external rotation    Knee flexion 4 4  Knee extension 4+ 4  Ankle dorsiflexion 4+ 4  Ankle plantarflexion 4 3+  Ankle inversion    Ankle eversion    (Blank rows = not tested)   GAIT: Gait pattern: R hip instability and hip drop,  heavy anterior and L lean on walker Assistive device utilized: Walker - 4 wheeled Level of assistance: Modified independence   FUNCTIONAL  TESTS:    2.38 ft/sec                                                                                                                               TREATMENT DATE: 10/17/23     PATIENT EDUCATION: Education details: discussion on possible "life alert" in case of falls, prognosis, POC, HEP Person educated: Patient Education method: Explanation, Demonstration, Tactile cues, Verbal cues, and Handouts Education comprehension: verbalized understanding and returned demonstration  HOME EXERCISE PROGRAM: Access Code: [email or web address] URL: [email or web address] Date: 10/17/2023 Prepared by: [facility] - Outpatient  Rehab - [facility]  Exercises - Sit to Stand with Arms Crossed  - 1 x daily - 5 x weekly - 2 sets - 10 reps - Seated March with Resistance  - 1 x daily - 5 x weekly - 2 sets - 10 reps - Seated Scapular Retraction  - 1 x daily - 5 x weekly - 2 sets - 10 reps - 3 sec hold - Standing Hip Abduction with Counter Support  - 1 x daily - 5 x weekly - 2 sets - 10 reps  GOALS: Goals reviewed with patient? Yes  SHORT TERM GOALS: Target date: 11/14/2023  Patient to be independent with initial HEP. Baseline: HEP initiated Goal status: IN PROGRESS    LONG TERM GOALS: Target date: 12/12/2023  Patient to be independent with advanced HEP. Baseline: Not yet initiated  Goal status: IN PROGRESS  Patient to demonstrate B LE strength >/=4/5.  Baseline: See above Goal status: IN PROGRESS  Patient to report understanding of posture and body mechanics education for management of neck and LBP.  Baseline: not yet initiated  Goal status: IN PROGRESS  Patient to report putting plans to action in getting a life alert or other emergency communication system in case of falls.  Baseline: not yet in effect; discussed again 10/31/23 Goal status: IN PROGRESS  Patient to demonstrate gait speed of at least 2.63 ft/sec in order to improve access to community.   Baseline: 2.3 ft/sec Goal  status: IN PROGRESS  Patient to demonstrate 5xSTS test in <15 sec without use of UEs and with good eccentric control in order to  decrease risk of falls.  Baseline: 13.73 sec pulling up on walker and with decreased eccentric control Goal status: IN PROGRESS  Patient to score at least 46/56 on Berg in order to decrease risk of falls.  Baseline: NT Goal status: IN PROGRESS  ASSESSMENT:  CLINICAL IMPRESSION: Pt presents today with reports of two additional falls since last visit.  She denies any additional injury. Worked today on hip/ankle/step strategy work, to help with balance recovery.  She does these exercises well, and she does need BUE support.  She performs well and wants to try these at home.  Discussed optimal safety to perform these exercises at home and to stop performing these if she feels unsteady.  Pt will continue to benefit from skilled PT towards goals for improved functional mobility and decreased fall risk.   OBJECTIVE IMPAIRMENTS: Abnormal gait, decreased activity tolerance, decreased balance, difficulty walking, decreased ROM, decreased strength, increased muscle spasms, impaired flexibility, postural dysfunction, and pain.   ACTIVITY LIMITATIONS: carrying, lifting, bending, sitting, standing, squatting, stairs, transfers, bathing, toileting, dressing, reach over head, hygiene/grooming, and locomotion level  PARTICIPATION LIMITATIONS: meal prep, cleaning, laundry, driving, shopping, community activity, and church  PERSONAL FACTORS: Age, Behavior pattern, Past/current experiences, Time since onset of injury/illness/exacerbation, and 3+ comorbidities: Anemia, CTS, CVA 2009, GERD, breast CA s/p mastectomy, HTN, HLD, Idiopathic pulmonary fibrosis, migraine, osteoporosis, peripheral neuropathy, polymyalgia rheumatica, seizures, temporal arteritis, DMII, cervical fusion 2010, lumbar fusion 2011, 2013, L TKA  are also affecting patient's functional outcome.   REHAB POTENTIAL:  Good  CLINICAL DECISION MAKING: Evolving/moderate complexity  EVALUATION COMPLEXITY: Moderate  PLAN:  PT FREQUENCY: 2x/week  PT DURATION: 8 weeks  PLANNED INTERVENTIONS: 97164- PT Re-evaluation, 97110-Therapeutic exercises, 97530- Therapeutic activity, 97112- Neuromuscular re-education, 97535- Self Care, [id number]- Manual therapy, [id number]- Gait training, [phone]- Aquatic Therapy, 97014- Electrical stimulation (unattended), Patient/Family education, Balance training, Stair training, Taping, Dry Needling, Joint mobilization, Spinal mobilization, Vestibular training, Cryotherapy, and Moist heat  PLAN FOR NEXT SESSION: Review update to HEP .  Continued work on balance strategies.f/u on life alert of PCA in the home? Trial RW? Review HEP, progress BLE strengthening including gluts and abductors, postural strengthening and stretching to address L trunk lean    Lonia Blood, PT 11/05/23 11:50 AM Phone: [phone] Fax: [phone]   [facility] [address] [place], [place] [address] Phone # [phone] Fax # [phone]

## 2023-11-06 DIAGNOSIS — Z1231 Encounter for screening mammogram for malignant neoplasm of breast: Secondary | ICD-10-CM | POA: Diagnosis not present

## 2023-11-06 LAB — HM MAMMOGRAPHY

## 2023-11-06 NOTE — Therapy (Signed)
 OUTPATIENT PHYSICAL THERAPY NEURO TREATMENT NOTE   Patient Name: [name] MRN: [id number] DOB:[birth year], 83 y.o., female Today's Date: 11/07/2023   PCP: Kristian Covey, MD  REFERRING PROVIDER: Kristian Covey, MD   END OF SESSION:  PT End of Session - 11/07/23 1143     Visit Number 5    Number of Visits 17    Date for PT Re-Evaluation 12/12/23    Authorization Type Medicare/Tricare    PT Start Time 1102    PT Stop Time 1143    PT Time Calculation (min) 41 min    Equipment Utilized During Treatment Gait belt    Activity Tolerance Patient tolerated treatment well    Behavior During Therapy WFL for tasks assessed/performed                 Past Medical History:  Diagnosis Date   Allergic rhinitis    Anemia    Anxiety    Bronchiectasis    Carpal tunnel syndrome 07/13/2015   Bilateral   CVA (cerebral infarction) 10/2007   Right thalamic    Diverticulosis of colon    Gait disorder    GERD (gastroesophageal reflux disease)    History of breast cancer    HTN (hypertension)    Hyperlipidemia    Idiopathic pulmonary fibrosis    Left knee DJD    Migraine    Osteoporosis    Peripheral neuropathy    PMR (polymyalgia rheumatica) (HCC)    Polyneuropathy in other diseases classified elsewhere (HCC) 02/17/2013   Previous back surgery 10/09/12   Renal insufficiency    Rheumatoid arthritis (HCC)    Seizure disorder (HCC)    Spondylosis, cervical, with myelopathy 08/31/2015   C3-4 myelopathy   Temporal arteritis (HCC)    Right eye blind, on steroids per [facility]/ Dr Anne Hahn   Type II or unspecified type diabetes mellitus without mention of complication, not stated as uncontrolled    2nd to steriods   Vitamin D deficiency    Past Surgical History:  Procedure Laterality Date   APPENDECTOMY  01/2021   CARPAL TUNNEL RELEASE Right    CATARACT EXTRACTION     OS - Summer 2010   CERVICAL FUSION  09/24/2008   4 rods and pins in place   CHOLECYSTECTOMY      COLONOSCOPY  12/15/2011   Procedure: COLONOSCOPY;  Surgeon: Charna Elizabeth, MD;  Location: [facility];  Service: Endoscopy;  Laterality: N/A;   LUMBAR FUSION  04/24/2010   W/Mechanical fixation - L[facility]   LUMBAR FUSION  09/25/2011   rods in hips (to stabilize).   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) 08/25/2019   Pain management 10/30/2017   Rheumatoid arthritis (HCC) 04/25/2017   S/P cervical spinal fusion 09/05/2015   Spondylosis, cervical, with myelopathy 08/31/2015   Carpal tunnel syndrome  07/13/2015   Torn ear lobe 06/07/2015   Obesity (BMI 30-39.9) 04/06/2014   Shortness of breath 07/14/2013   Bone cyst 07/14/2013   Polyneuropathy in other diseases classified elsewhere (HCC) 02/17/2013   Chronic back pain 12/09/2012   Peripheral edema 09/25/2012   Anemia due to chronic illness 09/01/2012   DDD (degenerative disc disease), lumbosacral 08/12/2012   Cervicalgia 07/11/2012   Disturbance of skin sensation 07/11/2012   Temporal arteritis (HCC) 04/01/2012   Lower GI bleed 12/24/2011   Rectal bleed 12/13/2011   Arthrodesis status 09/04/2011   Chronic renal insufficiency, stage II (mild) 04/04/2011   LUMBAR RADICULOPATHY, LEFT 04/18/2009   Essential hypertension 12/30/2007   SEIZURE DISORDER 12/30/2007   CEREBROVASCULAR ACCIDENT, HX OF 12/30/2007   Hyperlipidemia 10/08/2007   GERD 10/08/2007    DIVERTICULOSIS, COLON 10/08/2007   BRONCHIECTASIS 06/26/2007   PULMONARY FIBROSIS 05/17/2007   Osteoporosis 05/17/2007   BREAST CANCER, HX OF 05/17/2007   MASTECTOMY, LEFT, HX OF 05/17/2007    ONSET DATE: November 2024  REFERRING DIAG: R29.898 (ICD-10-CM) - Leg weakness, bilateral  THERAPY DIAG:  Other abnormalities of gait and mobility  Unsteadiness on feet  Muscle weakness (generalized)  Abnormal posture  Cervicalgia  Other low back pain  Rationale for Evaluation and Treatment: Rehabilitation  SUBJECTIVE:                                                                                                                                                                                             SUBJECTIVE STATEMENT: Feeling a little tired- did not sleep well. No falls since last session.   Pt accompanied by: self  PERTINENT HISTORY: Anemia, CTS, CVA 2009, GERD, breast CA s/p mastectomy, HTN, HLD, Idiopathic pulmonary fibrosis, migraine, osteoporosis, peripheral neuropathy, polymyalgia rheumatica, seizures, temporal arteritis, DMII, cervical fusion 2010, lumbar fusion 2011, 2013, L TKA  PAIN:  Are you having pain? Yes: NPRS scale: 0/10 Pain location: R hip Pain description: sore Aggravating factors: siting on it, touching it Relieving factors: nothing  PRECAUTIONS: Fall and Other: osteoporosis ; low vision R eye  RED FLAGS: None   WEIGHT BEARING RESTRICTIONS: No  FALLS: Has patient fallen in last 6 months? Yes. Number of falls 2  LIVING ENVIRONMENT: Lives with: lives alone Lives in: House/apartment Stairs:  ramp entry; 1 story with 14 steps into finished attic (reports that she is afraid to go up there) Has following equipment at home: Quad cane small base, Walker - 4 wheeled, shower chair, Grab bars, and Ramped entry   PLOF: Independent and has a cleaning lady every 3 weeks; drives to [place] every weekend to assist in taking  care of her sister who has dementia     PATIENT GOALS: improve leg strength   OBJECTIVE:     TODAY'S TREATMENT: 11/07/23 Activity Comments  review of HEP update:  Seated Hip Abduction with Resistance 10x Seated Knee Extension with Resistance 10x each alt step forward in II bars  required cueing for max carryover of activities. Patient reports that she was not using TB at home.   Observed patient's R lateral hip which revealed large edematous hematoma. Encouraged use of ice for 10 min at a time and to contact her MD if pain worsens    STS without UEs  10x  Pushing off knees; cues to pause to focus on tall posture and get balance fully before sitting back down   Bridge 2x10  Initial cueing to address HS spams d/t heavy HS bias   R/L HS stretch with strap 32x0" each Cueing to hold for full duration; report of good relief   Sitting red TB rows 10x Cues for proper posture    HOME EXERCISE PROGRAM Last updated: 11/07/23 Access Code: [email or web address] URL: [email or web address] Date: 11/07/2023 Prepared by: [facility] - Outpatient  Rehab - [facility]  Exercises - Sit to Stand with Arms Crossed  - 1 x daily - 5 x weekly - 2 sets - 10 reps - Seated March with Resistance  - 1 x daily - 5 x weekly - 2 sets - 10 reps - Seated Scapular Retraction  - 1 x daily - 5 x weekly - 2 sets - 10 reps - 3 sec hold - Standing Hip Abduction with Counter Support  - 1 x daily - 5 x weekly - 2 sets - 10 reps - Seated Hip Abduction with Resistance  - 1 x daily - 5 x weekly - 3 sets - 10 reps - Seated Knee Extension with Resistance  - 1 x daily - 5 x weekly - 3 sets - 10 reps - Alternating Step forward-Balance Reaction  - 1 x daily - 7 x weekly - 3 sets - 10 reps - Alternating Step forward-Balance Reaction  - 1 x daily - 7 x weekly - 1-2 sets - 5 reps - Supine Bridge  - 1 x daily - 5 x weekly - 2 sets - 10 reps - Supine Hamstring Stretch with Strap  - 1 x daily - 5 x weekly - 2 sets - 30 sec hold    PATIENT EDUCATION: Education  details: HEP update, edu on use of ice on R hip, reviewed safety with walker when performing transfers  Person educated: Patient Education method: Explanation, Demonstration, Tactile cues, Verbal cues, and Handouts Education comprehension: verbalized understanding and returned demonstration      -------------------------------------------------- Note: Objective measures below were completed at Evaluation unless otherwise noted.  DIAGNOSTIC FINDINGS: 09/02/23 L lower leg xray:  No acute fracture. 2. Postsurgical changes of total left knee arthroplasty without evidence of hardware failure. 3. Moderate plantar calcaneal heel spur. 4. Possible wound within the proximal lateral calf soft tissues with patchy soft tissue sclerosis. Recommend clinical correlation. No cortical erosion is seen.  COGNITION: Overall cognitive status: Within functional limits for tasks assessed   SENSATION: Reports N/T in B feet  COORDINATION: Alternating pronation/supination: WNL B Alternating toe tap: WNL B Finger to nose: WNL B  POSTURE:  elevated shoulders, rounded posture, L lateral trunk flexion with R hip deviated further R ; fallen arch on L foot  LOWER EXTREMITY ROM:     Active  Right Eval Left  Eval  Hip flexion    Hip extension    Hip abduction    Hip adduction    Hip internal rotation    Hip external rotation    Knee flexion    Knee extension    Ankle dorsiflexion 23 9  Ankle plantarflexion    Ankle inversion    Ankle eversion     (Blank rows = not tested)  LOWER EXTREMITY MMT:    MMT (in sitting) Right Eval Left Eval  Hip flexion 4+ 3+  Hip extension    Hip abduction 4 4  Hip adduction 4+ 4  Hip internal rotation    Hip external rotation    Knee flexion 4 4  Knee extension 4+ 4  Ankle dorsiflexion 4+ 4  Ankle plantarflexion 4 3+  Ankle inversion    Ankle eversion    (Blank rows = not tested)   GAIT: Gait pattern: R hip instability and hip drop,  heavy anterior  and L lean on walker Assistive device utilized: Walker - 4 wheeled Level of assistance: Modified independence   FUNCTIONAL TESTS:    2.38 ft/sec                                                                                                                               TREATMENT DATE: 10/17/23     PATIENT EDUCATION: Education details: discussion on possible "life alert" in case of falls, prognosis, POC, HEP Person educated: Patient Education method: Explanation, Demonstration, Tactile cues, Verbal cues, and Handouts Education comprehension: verbalized understanding and returned demonstration  HOME EXERCISE PROGRAM: Access Code: [email or web address] URL: [email or web address] Date: 10/17/2023 Prepared by: [facility] - Outpatient  Rehab - [facility]  Exercises - Sit to Stand with Arms Crossed  - 1 x daily - 5 x weekly - 2 sets - 10 reps - Seated March with Resistance  - 1 x daily - 5 x weekly - 2 sets - 10 reps - Seated Scapular Retraction  - 1 x daily - 5 x weekly - 2 sets - 10 reps - 3 sec hold - Standing Hip Abduction with Counter Support  - 1 x daily - 5 x weekly - 2 sets - 10 reps  GOALS: Goals reviewed with patient? Yes  SHORT TERM GOALS: Target date: 11/14/2023  Patient to be independent with initial HEP. Baseline: HEP initiated Goal status: IN PROGRESS    LONG TERM GOALS: Target date: 12/12/2023  Patient to be independent with advanced HEP. Baseline: Not yet initiated  Goal status: IN PROGRESS  Patient to demonstrate B LE strength >/=4/5.  Baseline: See above Goal status: IN PROGRESS  Patient to report understanding of posture and body mechanics education for management of neck and LBP.  Baseline: not yet initiated  Goal status: IN PROGRESS  Patient to report putting plans to action in getting a life alert or other emergency communication system in case of falls.  Baseline: not yet in effect; discussed  again 10/31/23 Goal status: IN  PROGRESS  Patient to demonstrate gait speed of at least 2.63 ft/sec in order to improve access to community.   Baseline: 2.3 ft/sec Goal status: IN PROGRESS  Patient to demonstrate 5xSTS test in <15 sec without use of UEs and with good eccentric control in order to decrease risk of falls.  Baseline: 13.73 sec pulling up on walker and with decreased eccentric control Goal status: IN PROGRESS  Patient to score at least 46/56 on Berg in order to decrease risk of falls.  Baseline: NT Goal status: IN PROGRESS  ASSESSMENT:  CLINICAL IMPRESSION: Patient arrived to session without new complaints. Denies falls since last session. HEP update was reviewed and required cueing for max carryover of activities. Observed patient's R lateral hip during beginning of session which revealed large edematous hematoma, likely from fall occurring on 10/30/23 . She reports no pain unless she puts pressure on it. Re-iterated recommendation to use ice for 10 min at a time and to contact her MD if pain worsens. Patient reported understanding. Patient performed progression of core and hip strengthening today with good tolerance. Good response to cueing demonstrated. No complaints at end of session.   OBJECTIVE IMPAIRMENTS: Abnormal gait, decreased activity tolerance, decreased balance, difficulty walking, decreased ROM, decreased strength, increased muscle spasms, impaired flexibility, postural dysfunction, and pain.   ACTIVITY LIMITATIONS: carrying, lifting, bending, sitting, standing, squatting, stairs, transfers, bathing, toileting, dressing, reach over head, hygiene/grooming, and locomotion level  PARTICIPATION LIMITATIONS: meal prep, cleaning, laundry, driving, shopping, community activity, and church  PERSONAL FACTORS: Age, Behavior pattern, Past/current experiences, Time since onset of injury/illness/exacerbation, and 3+ comorbidities: Anemia, CTS, CVA 2009, GERD, breast CA s/p mastectomy, HTN, HLD, Idiopathic  pulmonary fibrosis, migraine, osteoporosis, peripheral neuropathy, polymyalgia rheumatica, seizures, temporal arteritis, DMII, cervical fusion 2010, lumbar fusion 2011, 2013, L TKA  are also affecting patient's functional outcome.   REHAB POTENTIAL: Good  CLINICAL DECISION MAKING: Evolving/moderate complexity  EVALUATION COMPLEXITY: Moderate  PLAN:  PT FREQUENCY: 2x/week  PT DURATION: 8 weeks  PLANNED INTERVENTIONS: 97164- PT Re-evaluation, 97110-Therapeutic exercises, 97530- Therapeutic activity, 97112- Neuromuscular re-education, 97535- Self Care, 96045- Manual therapy, [id number]- Gait training, [phone]- Aquatic Therapy, 97014- Electrical stimulation (unattended), Patient/Family education, Balance training, Stair training, Taping, Dry Needling, Joint mobilization, Spinal mobilization, Vestibular training, Cryotherapy, and Moist heat  PLAN FOR NEXT SESSION: Review update to HEP .  Continued work on balance strategies.f/u on life alert of PCA in the home? Trial RW? Review HEP, progress BLE strengthening including gluts and abductors, postural strengthening and stretching to address L trunk lean   Baldemar Friday, PT, DPT 11/07/23 11:47 AM  [facility] at [facility] [address] [place], [place] [address] Phone # [phone] Fax # [phone]

## 2023-11-07 ENCOUNTER — Encounter: Payer: Self-pay | Admitting: Physical Therapy

## 2023-11-07 ENCOUNTER — Ambulatory Visit: Payer: Medicare Other | Admitting: Physical Therapy

## 2023-11-07 ENCOUNTER — Encounter: Payer: Self-pay | Admitting: Family Medicine

## 2023-11-07 DIAGNOSIS — M6281 Muscle weakness (generalized): Secondary | ICD-10-CM | POA: Diagnosis not present

## 2023-11-07 DIAGNOSIS — R293 Abnormal posture: Secondary | ICD-10-CM | POA: Diagnosis not present

## 2023-11-07 DIAGNOSIS — R2689 Other abnormalities of gait and mobility: Secondary | ICD-10-CM

## 2023-11-07 DIAGNOSIS — M542 Cervicalgia: Secondary | ICD-10-CM | POA: Diagnosis not present

## 2023-11-07 DIAGNOSIS — R2681 Unsteadiness on feet: Secondary | ICD-10-CM

## 2023-11-07 DIAGNOSIS — M5459 Other low back pain: Secondary | ICD-10-CM

## 2023-11-12 ENCOUNTER — Encounter: Payer: Self-pay | Admitting: Physical Therapy

## 2023-11-12 ENCOUNTER — Ambulatory Visit: Payer: Medicare Other | Admitting: Physical Therapy

## 2023-11-12 DIAGNOSIS — R2681 Unsteadiness on feet: Secondary | ICD-10-CM

## 2023-11-12 DIAGNOSIS — R2689 Other abnormalities of gait and mobility: Secondary | ICD-10-CM | POA: Diagnosis not present

## 2023-11-12 DIAGNOSIS — M5459 Other low back pain: Secondary | ICD-10-CM | POA: Diagnosis not present

## 2023-11-12 DIAGNOSIS — M542 Cervicalgia: Secondary | ICD-10-CM | POA: Diagnosis not present

## 2023-11-12 DIAGNOSIS — M6281 Muscle weakness (generalized): Secondary | ICD-10-CM

## 2023-11-12 DIAGNOSIS — R293 Abnormal posture: Secondary | ICD-10-CM | POA: Diagnosis not present

## 2023-11-12 NOTE — Therapy (Signed)
 OUTPATIENT PHYSICAL THERAPY NEURO TREATMENT NOTE   Patient Name: [name] MRN: [id number] DOB:[birth year], 83 y.o., female Today's Date: 11/12/2023   PCP: Kristian Covey, MD  REFERRING PROVIDER: Kristian Covey, MD   END OF SESSION:  PT End of Session - 11/12/23 1041     Visit Number 6    Number of Visits 17    Date for PT Re-Evaluation 12/12/23    Authorization Type Medicare/Tricare    Progress Note Due on Visit 10    PT Start Time 1054    PT Stop Time 1147    PT Time Calculation (min) 53 min    Equipment Utilized During Treatment Gait belt    Activity Tolerance Patient tolerated treatment well    Behavior During Therapy WFL for tasks assessed/performed                  Past Medical History:  Diagnosis Date   Allergic rhinitis    Anemia    Anxiety    Bronchiectasis    Carpal tunnel syndrome 07/13/2015   Bilateral   CVA (cerebral infarction) 10/2007   Right thalamic    Diverticulosis of colon    Gait disorder    GERD (gastroesophageal reflux disease)    History of breast cancer    HTN (hypertension)    Hyperlipidemia    Idiopathic pulmonary fibrosis    Left knee DJD    Migraine    Osteoporosis    Peripheral neuropathy    PMR (polymyalgia rheumatica) (HCC)    Polyneuropathy in other diseases classified elsewhere (HCC) 02/17/2013   Previous back surgery 10/09/12   Renal insufficiency    Rheumatoid arthritis (HCC)    Seizure disorder (HCC)    Spondylosis, cervical, with myelopathy 08/31/2015   C3-4 myelopathy   Temporal arteritis (HCC)    Right eye blind, on steroids per [facility]/ Dr Anne Hahn   Type II or unspecified type diabetes mellitus without mention of complication, not stated as uncontrolled    2nd to steriods   Vitamin D deficiency    Past Surgical History:  Procedure Laterality Date   APPENDECTOMY  01/2021   CARPAL TUNNEL RELEASE Right    CATARACT EXTRACTION     OS - Summer 2010   CERVICAL FUSION  09/24/2008   4 rods and  pins in place   CHOLECYSTECTOMY     COLONOSCOPY  12/15/2011   Procedure: COLONOSCOPY;  Surgeon: Charna Elizabeth, MD;  Location: [facility];  Service: Endoscopy;  Laterality: N/A;   LUMBAR FUSION  04/24/2010   W/Mechanical fixation - L2-5 [facility]   LUMBAR FUSION  09/25/2011   rods in hips (to stabilize).   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) 08/25/2019   Pain management 10/30/2017   Rheumatoid arthritis (HCC) 04/25/2017   S/P cervical spinal fusion 09/05/2015  Spondylosis, cervical, with myelopathy 08/31/2015   Carpal tunnel syndrome 07/13/2015   Torn ear lobe 06/07/2015   Obesity (BMI 30-39.9) 04/06/2014   Shortness of breath 07/14/2013   Bone cyst 07/14/2013   Polyneuropathy in other diseases classified elsewhere (HCC) 02/17/2013   Chronic back pain 12/09/2012   Peripheral edema 09/25/2012   Anemia due to chronic illness 09/01/2012   DDD (degenerative disc disease), lumbosacral 08/12/2012   Cervicalgia 07/11/2012   Disturbance of skin sensation 07/11/2012   Temporal arteritis (HCC) 04/01/2012   Lower GI bleed 12/24/2011   Rectal bleed 12/13/2011   Arthrodesis status 09/04/2011   Chronic renal insufficiency, stage II (mild) 04/04/2011   LUMBAR RADICULOPATHY, LEFT 04/18/2009   Essential hypertension 12/30/2007   SEIZURE DISORDER 12/30/2007   CEREBROVASCULAR ACCIDENT, HX OF 12/30/2007   Hyperlipidemia  10/08/2007   GERD 10/08/2007   DIVERTICULOSIS, COLON 10/08/2007   BRONCHIECTASIS 06/26/2007   PULMONARY FIBROSIS 05/17/2007   Osteoporosis 05/17/2007   BREAST CANCER, HX OF 05/17/2007   MASTECTOMY, LEFT, HX OF 05/17/2007    ONSET DATE: November 2024  REFERRING DIAG: R29.898 (ICD-10-CM) - Leg weakness, bilateral  THERAPY DIAG:  Other abnormalities of gait and mobility  Unsteadiness on feet  Muscle weakness (generalized)  Rationale for Evaluation and Treatment: Rehabilitation  SUBJECTIVE:                                                                                                                                                                                             SUBJECTIVE STATEMENT: Not feeling so perky today; my stomach has been bothering me some; that happens sometimes.  Haven't done my new exercises yet.  No falls since last visit.  Bruising is slowly getting better.  It is only painful when I press on it. Pt accompanied by: self  PERTINENT HISTORY: Anemia, CTS, CVA 2009, GERD, breast CA s/p mastectomy, HTN, HLD, Idiopathic pulmonary fibrosis, migraine, osteoporosis, peripheral neuropathy, polymyalgia rheumatica, seizures, temporal arteritis, DMII, cervical fusion 2010, lumbar fusion 2011, 2013, L TKA  PAIN:  Are you having pain? Yes: NPRS scale: 0/10 Pain location: R hip Pain description: sore Aggravating factors: siting on it, touching it Relieving factors: nothing  PRECAUTIONS: Fall and Other: osteoporosis ; low vision R eye  RED FLAGS: None   WEIGHT BEARING RESTRICTIONS: No  FALLS: Has patient fallen in last 6 months? Yes. Number of falls 2  LIVING ENVIRONMENT: Lives with: lives alone Lives in: House/apartment Stairs:  ramp entry; 1 story with 14 steps into finished attic (reports that she is afraid to go up there) Has following equipment at home: Counselling psychologist, Walker - 4  wheeled, shower chair, Grab bars, and Ramped entry   PLOF:  Independent and has a cleaning lady every 3 weeks; drives to [place] every weekend to assist in taking care of her sister who has dementia    PATIENT GOALS: improve leg strength   OBJECTIVE:     TODAY'S TREATMENT: 11/12/2023 Activity Comments  Reviewed new additions to HEP: Bridging 3 x 5 reps Supine hamstring stretch Good return demo; pt brings in her strap today  Supine clamshell 2 x 10 reps No resistance  Seated glut sets 2 x 10 reps   Standing sidestep/together 2 x 10 BUE support  Alt step taps to 6" step, 2 x 10 BUE support  Cues for hand placement and safety with transfers throughout session Pt does attempt to take several steps without rollator and PT cues pt to use rollator in PT session between activities, for optimal safety  Sit to stand 5 reps-hands crossed at chest Cues for eccentric control  Standing feet apart EO head turns/nods x 5, EC 30 sec x 2 Standing partial heel/toe EO head turns/nods x 5, EC 30 sec   Gait x 85 ft, 2 reps, then 40 ft Cues for posture, staying close within BOS of walker   Access Code: [email or web address] URL: [email or web address] Date: 11/12/2023 Prepared by: [facility] - Outpatient  Rehab - [facility]  Exercises - Sit to Stand with Arms Crossed  - 1 x daily - 5 x weekly - 2 sets - 10 reps - Seated March with Resistance  - 1 x daily - 5 x weekly - 2 sets - 10 reps - Seated Scapular Retraction  - 1 x daily - 5 x weekly - 2 sets - 10 reps - 3 sec hold - Standing Hip Abduction with Counter Support  - 1 x daily - 5 x weekly - 2 sets - 10 reps - Seated Hip Abduction with Resistance  - 1 x daily - 5 x weekly - 3 sets - 10 reps - Seated Knee Extension with Resistance  - 1 x daily - 5 x weekly - 3 sets - 10 reps - Alternating Step forward-Balance Reaction  - 1 x daily - 7 x weekly - 1-2 sets - 5 reps - Supine Bridge  - 1 x daily - 5 x weekly - 2 sets - 10 reps - Supine Hamstring Stretch with Strap  - 1 x daily - 5 x weekly - 2 sets - 30 sec  hold - Bent Knee Fallouts  - 1 x daily - 5 x weekly - 3 sets - 10 reps - Seated Gluteal Sets  - 1 x daily - 5 x weekly - 3 sets - 10 reps - 3 sec hold    PATIENT EDUCATION: Education details: 11/12/2023:Review of recent HEP update and additions to HEP, edu on use of ice on R hip/follow up with MD as needed; reviewed safety with walker when performing transfers  Person educated: Patient Education method: Explanation, Demonstration, Tactile cues, Verbal cues, and Handouts Education comprehension: verbalized understanding and returned demonstration      -------------------------------------------------- Note: Objective measures below were completed at Evaluation unless otherwise noted.  DIAGNOSTIC FINDINGS: 09/02/23 L lower leg xray:  No acute fracture. 2. Postsurgical changes of total left knee arthroplasty without evidence of hardware failure. 3. Moderate plantar calcaneal heel spur. 4. Possible wound within the proximal lateral calf soft tissues with patchy soft tissue sclerosis. Recommend clinical correlation. No cortical erosion is seen.  COGNITION: Overall cognitive status: Within functional  limits for tasks assessed   SENSATION: Reports N/T in B feet  COORDINATION: Alternating pronation/supination: WNL B Alternating toe tap: WNL B Finger to nose: WNL B  POSTURE:  elevated shoulders, rounded posture, L lateral trunk flexion with R hip deviated further R ; fallen arch on L foot  LOWER EXTREMITY ROM:     Active  Right Eval Left Eval  Hip flexion    Hip extension    Hip abduction    Hip adduction    Hip internal rotation    Hip external rotation    Knee flexion    Knee extension    Ankle dorsiflexion 23 9  Ankle plantarflexion    Ankle inversion    Ankle eversion     (Blank rows = not tested)  LOWER EXTREMITY MMT:    MMT (in sitting) Right Eval Left Eval  Hip flexion 4+ 3+  Hip extension    Hip abduction 4 4  Hip adduction 4+ 4  Hip internal rotation     Hip external rotation    Knee flexion 4 4  Knee extension 4+ 4  Ankle dorsiflexion 4+ 4  Ankle plantarflexion 4 3+  Ankle inversion    Ankle eversion    (Blank rows = not tested)   GAIT: Gait pattern: R hip instability and hip drop,  heavy anterior and L lean on walker Assistive device utilized: Walker - 4 wheeled Level of assistance: Modified independence   FUNCTIONAL TESTS:    2.38 ft/sec                                                                                                                               TREATMENT DATE: 10/17/23     PATIENT EDUCATION: Education details: discussion on possible "life alert" in case of falls, prognosis, POC, HEP Person educated: Patient Education method: Explanation, Demonstration, Tactile cues, Verbal cues, and Handouts Education comprehension: verbalized understanding and returned demonstration  HOME EXERCISE PROGRAM: Access Code: [email or web address] URL: [email or web address] Date: 10/17/2023 Prepared by: [facility] - Outpatient  Rehab - [facility]  Exercises - Sit to Stand with Arms Crossed  - 1 x daily - 5 x weekly - 2 sets - 10 reps - Seated March with Resistance  - 1 x daily - 5 x weekly - 2 sets - 10 reps - Seated Scapular Retraction  - 1 x daily - 5 x weekly - 2 sets - 10 reps - 3 sec hold - Standing Hip Abduction with Counter Support  - 1 x daily - 5 x weekly - 2 sets - 10 reps  GOALS: Goals reviewed with patient? Yes  SHORT TERM GOALS: Target date: 11/14/2023  Patient to be independent with initial HEP. Baseline: HEP initiated and reviewed Goal status: MET2/18/2025    LONG TERM GOALS: Target date: 12/12/2023  Patient to be independent with advanced HEP. Baseline: Not yet initiated  Goal status: IN PROGRESS  Patient to demonstrate B LE strength >/=  4/5.  Baseline: See above Goal status: IN PROGRESS  Patient to report understanding of posture and body mechanics education for management of neck  and LBP.  Baseline: not yet initiated  Goal status: IN PROGRESS  Patient to report putting plans to action in getting a life alert or other emergency communication system in case of falls.  Baseline: not yet in effect; discussed again 10/31/23 Goal status: IN PROGRESS  Patient to demonstrate gait speed of at least 2.63 ft/sec in order to improve access to community.   Baseline: 2.3 ft/sec Goal status: IN PROGRESS  Patient to demonstrate 5xSTS test in <15 sec without use of UEs and with good eccentric control in order to decrease risk of falls.  Baseline: 13.73 sec pulling up on walker and with decreased eccentric control Goal status: IN PROGRESS  Patient to score at least 46/56 on Berg in order to decrease risk of falls.  Baseline: NT Goal status: IN PROGRESS  ASSESSMENT:  CLINICAL IMPRESSION: Pt presents today with no new complaints, no falls; she does note she can tell she is getting stronger from the exercises. Skilled PT session focused on glut strengthening, transfer practice and balance exercises.  She is quite challenged by static standing with eyes closed, but is able to do this without EU support. Pt needs min guard/close supervision for safety.. Pt will continue to benefit from skilled PT towards goals for improved functional mobility and decreased fall risk.   OBJECTIVE IMPAIRMENTS: Abnormal gait, decreased activity tolerance, decreased balance, difficulty walking, decreased ROM, decreased strength, increased muscle spasms, impaired flexibility, postural dysfunction, and pain.   ACTIVITY LIMITATIONS: carrying, lifting, bending, sitting, standing, squatting, stairs, transfers, bathing, toileting, dressing, reach over head, hygiene/grooming, and locomotion level  PARTICIPATION LIMITATIONS: meal prep, cleaning, laundry, driving, shopping, community activity, and church  PERSONAL FACTORS: Age, Behavior pattern, Past/current experiences, Time since onset of  injury/illness/exacerbation, and 3+ comorbidities: Anemia, CTS, CVA 2009, GERD, breast CA s/p mastectomy, HTN, HLD, Idiopathic pulmonary fibrosis, migraine, osteoporosis, peripheral neuropathy, polymyalgia rheumatica, seizures, temporal arteritis, DMII, cervical fusion 2010, lumbar fusion 2011, 2013, L TKA  are also affecting patient's functional outcome.   REHAB POTENTIAL: Good  CLINICAL DECISION MAKING: Evolving/moderate complexity  EVALUATION COMPLEXITY: Moderate  PLAN:  PT FREQUENCY: 2x/week  PT DURATION: 8 weeks  PLANNED INTERVENTIONS: 97164- PT Re-evaluation, 97110-Therapeutic exercises, 97530- Therapeutic activity, 97112- Neuromuscular re-education, 97535- Self Care, 81191- Manual therapy, [id number]- Gait training, [phone]- Aquatic Therapy, 97014- Electrical stimulation (unattended), Patient/Family education, Balance training, Stair training, Taping, Dry Needling, Joint mobilization, Spinal mobilization, Vestibular training, Cryotherapy, and Moist heat  PLAN FOR NEXT SESSION: Review update to HEP .  Continued work on balance strategies. Progress BLE strengthening including gluts and abductors, postural strengthening and stretching to address L trunk lean   Lonia Blood, PT 11/12/23 1:03 PM Phone: [phone] Fax: [phone]  [facility] at [facility] [address], [address] [place], [place] [address] Phone # [phone] Fax # [phone]

## 2023-11-14 ENCOUNTER — Ambulatory Visit: Payer: Medicare Other | Admitting: Physical Therapy

## 2023-11-19 ENCOUNTER — Ambulatory Visit (INDEPENDENT_AMBULATORY_CARE_PROVIDER_SITE_OTHER): Payer: Self-pay | Admitting: Neurology

## 2023-11-19 ENCOUNTER — Encounter: Payer: Self-pay | Admitting: Physical Therapy

## 2023-11-19 ENCOUNTER — Ambulatory Visit: Payer: Medicare Other | Admitting: Neurology

## 2023-11-19 ENCOUNTER — Ambulatory Visit: Payer: Medicare Other | Admitting: Physical Therapy

## 2023-11-19 DIAGNOSIS — R293 Abnormal posture: Secondary | ICD-10-CM | POA: Diagnosis not present

## 2023-11-19 DIAGNOSIS — R2689 Other abnormalities of gait and mobility: Secondary | ICD-10-CM | POA: Diagnosis not present

## 2023-11-19 DIAGNOSIS — R2681 Unsteadiness on feet: Secondary | ICD-10-CM | POA: Diagnosis not present

## 2023-11-19 DIAGNOSIS — T148XXA Other injury of unspecified body region, initial encounter: Secondary | ICD-10-CM | POA: Diagnosis not present

## 2023-11-19 DIAGNOSIS — G3184 Mild cognitive impairment, so stated: Secondary | ICD-10-CM

## 2023-11-19 DIAGNOSIS — G63 Polyneuropathy in diseases classified elsewhere: Secondary | ICD-10-CM

## 2023-11-19 DIAGNOSIS — R269 Unspecified abnormalities of gait and mobility: Secondary | ICD-10-CM

## 2023-11-19 DIAGNOSIS — Z0289 Encounter for other administrative examinations: Secondary | ICD-10-CM

## 2023-11-19 DIAGNOSIS — M6281 Muscle weakness (generalized): Secondary | ICD-10-CM | POA: Diagnosis not present

## 2023-11-19 DIAGNOSIS — M5459 Other low back pain: Secondary | ICD-10-CM | POA: Diagnosis not present

## 2023-11-19 DIAGNOSIS — M316 Other giant cell arteritis: Secondary | ICD-10-CM

## 2023-11-19 DIAGNOSIS — M542 Cervicalgia: Secondary | ICD-10-CM | POA: Diagnosis not present

## 2023-11-19 NOTE — Procedures (Signed)
 Full Name: [name] Gender: Female MRN #: [id number] Date of Birth: 12/07/1940    Visit Date: 11/19/2023 13:38 Age: 83 Years Examining Physician: Dr. Levert Feinstein Referring Physician: Margie Ege, NP Height: 5 feet 1 inch History: 83 year old female with progressive worsening gait abnormality history of cervical decompression surgery, lumbar decompression surgery in the past  Summary of the tests: Nerve conduction study: Bilateral sural, superficial peroneal sensory responses were absent.  Bilateral tibial motor responses show significantly decreased CMAP amplitude.  Bilateral peroneal to EDB motor responses were absent.  Right median and radial sensory responses were within normal limit.  Right ulnar sensory response showed mildly prolonged peak latency, within normal range snap amplitude.  Right ulnar motor responses were normal.  Electromyography: Selected needle examination of right upper, lower extremity muscles, right cervical and lumbosacral paraspinal muscles were performed.  There was evidence of chronic neuropathic changes at the right upper and lower extremity muscles tested, but there was no active process, there was no evidence of spontaneous activity at the right cervical and lumbosacral paraspinal muscles.  Conclusion: This is an abnormal study.  There is electrodiagnostic evidence of moderate axonal sensorimotor polyneuropathy, chronic right cervical and lumbosacral radiculopathy.  There is no evidence of intrinsic muscle disease.   Levert Feinstein. M.D. Ph.D.   [facility] [address], [address] [place], [place] [address] Tel: [phone] Fax: [phone]  Verbal informed consent was obtained from the patient, patient was informed of potential risk of procedure, including bruising, bleeding, hematoma formation, infection, muscle weakness, muscle pain, numbness, among others.        MNC    Nerve / Sites Muscle Latency Ref.  Amplitude Ref. Rel Amp Segments Distance Velocity Ref. Area    ms ms mV mV %  cm m/s m/s mVms  R Ulnar - ADM     Wrist ADM 3.0 <=3.3 7.4 >=6.0 100 Wrist - ADM 7   25.3     B.Elbow ADM 4.4  7.4  99.7 B.Elbow - Wrist 10 70 >=49 25.5     A.Elbow ADM 7.5  6.8  91.9 A.Elbow - B.Elbow 19 62 >=49 25.4  R Peroneal - EDB     Ankle EDB NR <=6.5 NR >=2.0 NR Ankle - EDB 9   NR         Pop fossa - Ankle      L Peroneal - EDB     Ankle EDB NR <=6.5 NR >=2.0 NR Ankle - EDB 9   NR         Pop fossa - Ankle      R Tibial - AH     Ankle AH 4.6 <=5.8 1.0 >=4.0 100 Ankle - AH 9   4.4     Pop fossa AH 11.5  1.0  99.8 Pop fossa - Ankle 32 47 >=41 3.4  L Tibial - AH     Ankle AH 5.3 <=5.8 1.8 >=4.0 100 Ankle - AH 9   4.4     Pop fossa AH 13.2  1.9  105 Pop fossa - Ankle 34 43 >=41 7.6               SNC    Nerve / Sites Rec. Site Peak Lat Ref.  Amp Ref. Segments Distance    ms ms V V  cm  R Radial - Anatomical snuff box (Forearm)     Forearm Wrist 2.3 <=2.9 29 >=15 Forearm - Wrist 10  R  Sural - Ankle (Calf)     Calf Ankle NR <=4.4 NR >=6 Calf - Ankle 14  L Sural - Ankle (Calf)     Calf Ankle NR <=4.4 NR >=6 Calf - Ankle 14  R Superficial peroneal - Ankle     Lat leg Ankle NR <=4.4 NR >=6 Lat leg - Ankle 14  L Superficial peroneal - Ankle     Lat leg Ankle NR <=4.4 NR >=6 Lat leg - Ankle 14  R Median - Orthodromic (Dig II, Mid palm)     Dig II Wrist 3.3 <=3.4 11 >=10 Dig II - Wrist 13  R Ulnar - Orthodromic, (Dig V, Mid palm)     Dig V Wrist 3.3 <=3.1 6 >=5 Dig V - Wrist 38                   F  Wave    Nerve F Lat Ref.   ms ms  R Tibial - AH NR <=56.0  L Tibial - AH NR <=56.0  R Ulnar - ADM 26.2 <=32.0           EMG Summary Table    Spontaneous MUAP Recruitment  Muscle IA Fib PSW Fasc Other Amp Dur. Poly Pattern  R. Biceps brachii Normal None None None _______ Normal Normal Normal Reduced  R. Deltoid Normal None None None _______ Normal Normal Normal Reduced  R. Extensor digitorum  communis Normal None None None _______ Normal Normal Normal Reduced  R. Brachioradialis Normal None None None _______ Normal Normal Normal Reduced  R. First dorsal interosseous Normal None None None _______ Normal Normal Normal Reduced  R. Cervical paraspinals Normal None None None _______ Normal Normal Normal Normal  R. Tibialis anterior Normal None None None _______ Normal Normal Normal Reduced  R. Peroneus longus Normal None None None _______ Normal Normal Normal Reduced  R. Gastrocnemius (Medial head) Normal None None None _______ Normal Normal Normal Reduced  R.  Iliopsoas Normal None None None ----------- Normal Normal Normal Normal  R. Vastus lateralis Normal None None None _______ Normal Normal Normal Reduced  R. Lumbar paraspinals (low) Normal None None None _______ Normal Normal Normal Normal  R. Lumbar paraspinals (mid) Normal None None None _______ Normal Normal Normal Normal

## 2023-11-19 NOTE — Progress Notes (Signed)
 ASSESSMENT AND PLAN  [name] is a 83 y.o. female    History of temporal arteritis Worsening gait abnormality  Keep current low-dose of prednisone 4 mg daily  Her gait abnormality, increased fall likely multifactorial, increased confusion, deformity of left foot, left ankle weakness, residual deficit from previous cervical myelopathy,  Laboratory evaluation including thyroid, CPK level to rule out treatable cause for her worsening gait abnormality Right hip hematoma,  CT of right hip area    I also talked with her daughter in law to update her on current condition  DIAGNOSTIC DATA (LABS, IMAGING, TESTING) - I reviewed patient records, labs, notes, testing and imaging myself where available. CXR on Aug 02 2022 1. No active cardiopulmonary disease. 2. Interstitial prominence predominantly at the lung bases, compatible with known interstitial lung disease. 3. Aortic atherosclerosis.  Lab in Nov 2023: CMP, creat 1.51, CBC, Hg 13.5, TSH, CPK, Troponin, A1C 5.0,   MEDICAL HISTORY:  [name] is a 83 year old female, accompanied by her l longtime friend Elease Hashimoto to follow-up on her recent hospital admission for passing out episode,  I reviewed and summarized the referring note. PMHX. 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 Anne Hahn in the past, temporal arteritis, with polymyalgia rheumatica, with sudden onset of right visual loss, she has been treated with prednisone for a long time, gradually tapering dose, currently taking 4 mg daily, reported relapse of body achy pain if the dosage tapering down to below 3 mg daily  She also has rheumatoid arthritis, gait abnormality, using walker since 2014, bilateral lower extremity neuropathic pain, required variable dose of neuropathic pain medications  At baseline, she lives independently alone at her house, drive, ambulate with walker, her  only son is a physician at [employer],  is in the process of moving patient to a facility to be closer to him  Hospital admission from November 9 to 10, 2023, around 10 AM August 02, 2022, she woke up, went to the bathroom to urinate, was able to wash her hands afterwards, while washing her hands, she felt lightheaded, dizzy," her brain feels so big", she took a quick steps to her chair, slumped into the chair, apparently lost consciousness for about 2 hours, she came around calling her friend Elease Hashimoto, was noted to have slurred speech, not herself, Elease Hashimoto urged her to call ambulance, was taken to the hospital  Prior to the incident, she has suffered left lower extremity abrasion infection due to  fall accident, she was taking antibiotics, did not have good appetite, was not able to keep up her fluid intake, was seen by wound care, pain medication OxyContin 10 mg twice a day  At the hospital, ultrasound of carotid artery showed no significant stenosis  Echocardiogram showed normal ejection fraction  LDL 85 A1c 5.0, TSH within normal limit, UA showed no evidence of infection, EKG showed no significant abnormality  MRI of the brain no acute intracranial abnormality, moderate small vessel disease, moderate atrophy  MRA of the brain showed no large vessel disease,  Worried about the TIA, she was given the prescription of Lipitor combination of aspirin and Plavix upon discharge, she has not taking Lipitor and Plavix,  Also reported a history of seizure, few years ago, was found by family on the floor, no antiepileptic medications  EEG in December 2023 was normal. Remains on 4 mg prednisone, with 3 mg dose felt  bad. No further spells of passing out. Has started drinking boost protein, food with medications. Living at home. Has walker. Getting around okay, 3 falls since November. On aspirin 81 mg daily. Planning to move to retirement homes in [facility], where her son lives, within the next 2 years. Has  noted some changes to her short term memory. MOCA 26/30. Looks very well today.   UPDATE Nov 19 2023: She continued to decline slowly, lives alone, drove herself to clinic today, increased gait abnormality, increased fall accident recently, multiple lower extremity skin abrasion, large size right hip hematoma has been ongoing for few months  She is here for EMG nerve conduction study to evaluate her increased weakness, gait abnormality, which showed evidence of moderate axonal sensorimotor polyneuropathy, chronic right cervical lumbar radiculopathy, no evidence of intrinsic muscle disease  She presented to emergency room on October 19, 2023 after fell, struck her face and her knee on the ground,  CT head without contrast showed forehead scalp hematoma, no acute intracranial abnormality, chronic small vessel disease  CT cervical spine showed nondisplaced the bilateral C7 pedicle fracture, cervical ACDF C3-C7, superimposed multilevel degenerative changes, chronic mild T3 compression fracture,  PHYSICAL EXAM:    10/28/2023    5:06 PM 10/28/2023    2:20 PM 10/19/2023    7:55 AM  Vitals with BMI  Height   5\' 1"   Weight  124 lbs 11 oz 122 lbs  BMI  23.57 23.06  Systolic 154 154   Diastolic 76 76   Pulse  91        07/04/2023    1:39 PM 12/20/2022   12:22 PM  Montreal Cognitive Assessment   Visuospatial/ Executive (0/5) 4 4  Naming (0/3) 3 3  Attention: Read list of digits (0/2) 2 2  Attention: Read list of letters (0/1) 1 1  Attention: Serial 7 subtraction starting at 100 (0/3) 2 2  Language: Repeat phrase (0/2) 2 1  Language : Fluency (0/1) 1 1  Abstraction (0/2) 2 2  Delayed Recall (0/5) 1 4  Orientation (0/6) 6 6  Total 24 26  Adjusted Score (based on education)  26     PHYSICAL EXAMNIATION:  Gen: NAD, conversant, well nourised, well groomed                     Cardiovascular: Regular rate rhythm, no peripheral edema, warm, nontender. Eyes: Conjunctivae clear without  exudates or hemorrhage Neck: Supple, no carotid bruits. Pulmonary: Clear to auscultation bilaterally   NEUROLOGICAL EXAM:  MENTAL STATUS: Speech/Cognition: Fragile, elderly female, awake, alert, normal speech, oriented to history taking and casual conversation.  CRANIAL NERVES: CN II: Visual fields are full to confrontation.  Pupils are round equal and briskly reactive to light. CN III, IV, VI: extraocular movement are normal. No ptosis. CN V: Facial sensation is intact to light touch. CN VII: Face is symmetric with normal eye closure and smile. CN VIII: Hearing is normal to casual conversation CN IX, X: Palate elevates symmetrically. Phonation is normal. CN XI: Head turning and shoulder shrug are intact CN XII: Tongue is midline with normal movements and no atrophy.  MOTOR: Deformity of left foot, collapsed left foot arch, mild to moderate left ankle dorsiflexion weakness, large size right hip hematoma  REFLEXES: Hyperreflexia of bilateral triceps, patella, bilateral Babinski signs,  SENSORY: Length-dependent decreased light touch, pinprick, vibratory sensation to knee level  COORDINATION: There is no trunk or limb ataxia.    GAIT/STANCE: Push-up to get  up from seated position reliant on her walker, dragging left leg more  REVIEW OF SYSTEMS:  Full 14 system review of systems performed and notable only for as above All other review of systems were negative.   ALLERGIES: Allergies  Allergen Reactions   Atorvastatin Nausea Only and Other (See Comments)    Dizzyness.    Hydromorphone Other (See Comments)    Cognitive changes  "made me unconscious" Dilaudid    HOME MEDICATIONS: Current Outpatient Medications  Medication Sig Dispense Refill   amLODipine (NORVASC) 5 MG tablet TAKE 1 TABLET DAILY 90 tablet 1   Calcium Carb-Cholecalciferol (CALCIUM CARBONATE-VITAMIN D3 PO) Take 1 tablet by mouth daily     cholecalciferol (VITAMIN D) 1000 UNITS tablet Take 1,000 Units by  mouth daily.     denosumab (PROLIA) 60 MG/ML SOLN injection Inject 60 mg into the skin every 6 (six) months.     docusate sodium (COLACE) 100 MG capsule Take 100 mg by mouth 2 (two) times daily.     furosemide (LASIX) 40 MG tablet TAKE 1 TABLET DAILY 90 tablet 1   gabapentin (NEURONTIN) 300 MG capsule TAKE 1 CAPSULE TWICE A DAY 360 capsule 3   Golimumab (SIMPONI ARIA IV) Inject into the vein. Takes infusion every 8 wks.     Multiple Vitamins-Minerals (MULTIVITAMIN,TX-MINERALS) tablet Take 1 tablet by mouth daily.     Omega-3 Fatty Acids (FISH OIL OMEGA-3 PO) Take 1 capsule by mouth daily.     oxyCODONE (OXYCONTIN) 10 mg 12 hr tablet Take 1 tablet (10 mg total) by mouth every 12 (twelve) hours. 180 tablet 0   Oxycodone HCl 10 MG TABS Take 1 tablet (10 mg total) by mouth every 6 (six) hours as needed. Take 1 tablet by mouth every 6 hours as needed for breakthrough pain 20 tablet 0   potassium chloride (KLOR-CON M) 10 MEQ tablet TAKE 1 TABLET TWICE A DAY 180 tablet 1   predniSONE (DELTASONE) 1 MG tablet TAKE 4 TABLETS DAILY WITH BREAKFAST 360 tablet 3   vitamin B-12 (CYANOCOBALAMIN) 100 MCG tablet Take 100 mcg by mouth daily.     No current facility-administered medications for this visit.    PAST MEDICAL HISTORY: Past Medical History:  Diagnosis Date   Allergic rhinitis    Anemia    Anxiety    Bronchiectasis    Carpal tunnel syndrome 07/13/2015   Bilateral   CVA (cerebral infarction) 10/2007   Right thalamic    Diverticulosis of colon    Gait disorder    GERD (gastroesophageal reflux disease)    History of breast cancer    HTN (hypertension)    Hyperlipidemia    Idiopathic pulmonary fibrosis    Left knee DJD    Migraine    Osteoporosis    Peripheral neuropathy    PMR (polymyalgia rheumatica) (HCC)    Polyneuropathy in other diseases classified elsewhere (HCC) 02/17/2013   Previous back surgery 10/09/12   Renal insufficiency    Rheumatoid arthritis (HCC)    Seizure disorder  (HCC)    Spondylosis, cervical, with myelopathy 08/31/2015   C3-4 myelopathy   Temporal arteritis (HCC)    Right eye blind, on steroids per [facility]/ Dr Anne Hahn   Type II or unspecified type diabetes mellitus without mention of complication, not stated as uncontrolled    2nd to steriods   Vitamin D deficiency     PAST SURGICAL HISTORY: Past Surgical History:  Procedure Laterality Date   APPENDECTOMY  01/2021   CARPAL TUNNEL RELEASE  Right    CATARACT EXTRACTION     OS - Summer 2010   CERVICAL FUSION  09/24/2008   4 rods and pins in place   CHOLECYSTECTOMY     COLONOSCOPY  12/15/2011   Procedure: COLONOSCOPY;  Surgeon: Charna Elizabeth, MD;  Location: [facility];  Service: Endoscopy;  Laterality: N/A;   LUMBAR FUSION  04/24/2010   W/Mechanical fixation - L2-5 [facility]   LUMBAR FUSION  09/25/2011   rods in hips (to stabilize).   MASTECTOMY     NECK SURGERY     rheumatoid nodule removal     SPINAL FUSION  07/25/2010   T10-L2 interbody fusion / [facility]   TONSILLECTOMY     TOTAL KNEE ARTHROPLASTY     Left    FAMILY HISTORY: Family History  Problem Relation Age of Onset   Brain cancer Mother    Hypertension Mother    Arthritis Mother    Dementia Father    Hypertension Father    Arthritis Father    Breast cancer Sister    Lung cancer Sister    Lung disease Neg Hx    Rheumatologic disease Neg Hx     SOCIAL HISTORY: Social History   Socioeconomic History   Marital status: Widowed    Spouse name: Not on file   Number of children: 1   Years of education: 12+ coll.   Highest education level: Not on file  Occupational History   Occupation: Clinical cytogeneticist: RETIRED    Comment: retired  Tobacco Use   Smoking status: Never   Smokeless tobacco: Never   Tobacco comments:    Father   Vaping Use   Vaping status: Never Used  Substance and Sexual Activity   Alcohol use: Not Currently    Comment: heavy drinker until  1995 - sobriety with AA   Drug use: No   Sexual activity: Not Currently  Other Topics Concern   Not on file  Social History Narrative   07/03/21 friend Elease Hashimoto living with her   Right handed   Drinks 6-8 cups caffeine daily      Social Drivers of Corporate investment banker Strain: Low Risk  (07/12/2023)   Overall Financial Resource Strain (CARDIA)    Difficulty of Paying Living Expenses: Not hard at all  Food Insecurity: No Food Insecurity (07/12/2023)   Hunger Vital Sign    Worried About Running Out of Food in the Last Year: Never true    Ran Out of Food in the Last Year: Never true  Transportation Needs: No Transportation Needs (07/12/2023)   PRAPARE - Administrator, Civil Service (Medical): No    Lack of Transportation (Non-Medical): No  Physical Activity: Inactive (07/12/2023)   Exercise Vital Sign    Days of Exercise per Week: 0 days    Minutes of Exercise per Session: 0 min  Stress: No Stress Concern Present (07/12/2023)   [employer] of Occupational Health - Occupational Stress Questionnaire    Feeling of Stress : Not at all  Social Connections: Moderately Integrated (07/12/2023)   Social Connection and Isolation Panel [NHANES]    Frequency of Communication with Friends and Family: More than three times a week    Frequency of Social Gatherings with Friends and Family: More than three times a week    Attends Religious Services: More than 4 times per year    Active Member of [employer] or Organizations: Yes  Attends Banker Meetings: More than 4 times per year    Marital Status: Widowed  Intimate Partner Violence: Not At Risk (07/12/2023)   Humiliation, Afraid, Rape, and Kick questionnaire    Fear of Current or Ex-Partner: No    Emotionally Abused: No    Physically Abused: No    Sexually Abused: No    Levert Feinstein, M.D. Ph.D.  [facility] [address] [place], [place] [address] Phone: [phone] Fax:       [phone]

## 2023-11-19 NOTE — Therapy (Signed)
 OUTPATIENT PHYSICAL THERAPY NEURO TREATMENT NOTE   Patient Name: [name] MRN: [id number] DOB:1941-01-10, 83 y.o., female Today's Date: 11/21/2023   PCP: Kristian Covey, MD  REFERRING PROVIDER: Kristian Covey, MD   END OF SESSION:  PT End of Session - 11/21/23 1144     Visit Number 8    Number of Visits 17    Date for PT Re-Evaluation 12/12/23    Authorization Type Medicare/Tricare    Progress Note Due on Visit 10    PT Start Time 1101    PT Stop Time 1144    PT Time Calculation (min) 43 min    Equipment Utilized During Treatment Gait belt    Activity Tolerance Patient tolerated treatment well    Behavior During Therapy WFL for tasks assessed/performed                   Past Medical History:  Diagnosis Date   Allergic rhinitis    Anemia    Anxiety    Bronchiectasis    Carpal tunnel syndrome 07/13/2015   Bilateral   CVA (cerebral infarction) 10/2007   Right thalamic    Diverticulosis of colon    Gait disorder    GERD (gastroesophageal reflux disease)    History of breast cancer    HTN (hypertension)    Hyperlipidemia    Idiopathic pulmonary fibrosis    Left knee DJD    Migraine    Osteoporosis    Peripheral neuropathy    PMR (polymyalgia rheumatica) (HCC)    Polyneuropathy in other diseases classified elsewhere (HCC) 02/17/2013   Previous back surgery 10/09/12   Renal insufficiency    Rheumatoid arthritis (HCC)    Seizure disorder (HCC)    Spondylosis, cervical, with myelopathy 08/31/2015   C3-4 myelopathy   Temporal arteritis (HCC)    Right eye blind, on steroids per [facility]/ Dr Anne Hahn   Type II or unspecified type diabetes mellitus without mention of complication, not stated as uncontrolled    2nd to steriods   Vitamin D deficiency    Past Surgical History:  Procedure Laterality Date   APPENDECTOMY  01/2021   CARPAL TUNNEL RELEASE Right    CATARACT EXTRACTION     OS - Summer 2010   CERVICAL FUSION  09/24/2008   4 rods and  pins in place   CHOLECYSTECTOMY     COLONOSCOPY  12/15/2011   Procedure: COLONOSCOPY;  Surgeon: Charna Elizabeth, MD;  Location: [facility];  Service: Endoscopy;  Laterality: N/A;   LUMBAR FUSION  04/24/2010   W/Mechanical fixation - L2-5 [facility]   LUMBAR FUSION  09/25/2011   rods in hips (to stabilize).   MASTECTOMY     NECK SURGERY     rheumatoid nodule removal     SPINAL FUSION  07/25/2010   T10-L2 interbody fusion / [facility]   TONSILLECTOMY     TOTAL KNEE ARTHROPLASTY     Left   Patient Active Problem List   Diagnosis Date Noted   Hematoma 11/19/2023   Mild cognitive impairment 07/04/2023   Pain due to onychomycosis of toenails of both feet 05/13/2023   Confusion 08/07/2022   TIA (transient ischemic attack) 08/02/2022   Gait disorder    Headache syndrome 01/03/2021   Subjective visual disturbance, left eye 12/29/2020   Exostosis of left foot 06/28/2020   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) 08/25/2019   Pain management 10/30/2017   Rheumatoid arthritis (HCC) 04/25/2017   S/P cervical  spinal fusion 09/05/2015   Spondylosis, cervical, with myelopathy 08/31/2015   Carpal tunnel syndrome 07/13/2015   Torn ear lobe 06/07/2015   Obesity (BMI 30-39.9) 04/06/2014   Shortness of breath 07/14/2013   Bone cyst 07/14/2013   Polyneuropathy in other diseases classified elsewhere (HCC) 02/17/2013   Chronic back pain 12/09/2012   Peripheral edema 09/25/2012   Anemia due to chronic illness 09/01/2012   DDD (degenerative disc disease), lumbosacral 08/12/2012   Cervicalgia 07/11/2012   Disturbance of skin sensation 07/11/2012   Temporal arteritis (HCC) 04/01/2012   Lower GI bleed 12/24/2011   Rectal bleed 12/13/2011   Arthrodesis status 09/04/2011   Chronic renal insufficiency, stage II (mild) 04/04/2011   LUMBAR RADICULOPATHY, LEFT 04/18/2009   Essential hypertension 12/30/2007   SEIZURE DISORDER 12/30/2007   CEREBROVASCULAR ACCIDENT, HX OF  12/30/2007   Hyperlipidemia 10/08/2007   GERD 10/08/2007   DIVERTICULOSIS, COLON 10/08/2007   BRONCHIECTASIS 06/26/2007   PULMONARY FIBROSIS 05/17/2007   Osteoporosis 05/17/2007   BREAST CANCER, HX OF 05/17/2007   MASTECTOMY, LEFT, HX OF 05/17/2007    ONSET DATE: November 2024  REFERRING DIAG: R29.898 (ICD-10-CM) - Leg weakness, bilateral  THERAPY DIAG:  Other abnormalities of gait and mobility  Unsteadiness on feet  Muscle weakness (generalized)  Abnormal posture  Rationale for Evaluation and Treatment: Rehabilitation  SUBJECTIVE:                                                                                                                                                                                             SUBJECTIVE STATEMENT: Do you think I should keep doing this? Has an appointment with Dr. Caryl Never tomorrow; reports continuous HA since her fall. Denies dizziness, sudden N/T or weakness, syncope. Denies falls since then.   Pt accompanied by: self  PERTINENT HISTORY: Anemia, CTS, CVA 2009, GERD, breast CA s/p mastectomy, HTN, HLD, Idiopathic pulmonary fibrosis, migraine, osteoporosis, peripheral neuropathy, polymyalgia rheumatica, seizures, temporal arteritis, DMII, cervical fusion 2010, lumbar fusion 2011, 2013, L TKA  PAIN:  Are you having pain? Yes: NPRS scale: 5/10 Pain location: forehead Pain description: headache Aggravating factors: fall Relieving factors: meds  PRECAUTIONS: Fall and Other: osteoporosis ; low vision R eye  RED FLAGS: None   WEIGHT BEARING RESTRICTIONS: No  FALLS: Has patient fallen in last 6 months? Yes. Number of falls 2  LIVING ENVIRONMENT: Lives with: lives alone Lives in: House/apartment Stairs:  ramp entry; 1 story with 14 steps into finished attic (reports that she is afraid to go up there) Has following equipment at home: Quad cane small base, Walker - 4 wheeled, shower chair, Grab bars, and Ramped  entry   PLOF:  Independent and has a cleaning lady every 3 weeks; drives to [place] every weekend to assist in taking care of her sister who has dementia    PATIENT GOALS: improve leg strength   OBJECTIVE:    TODAY'S TREATMENT: 11/21/2023 Activity Comments  STS without UEs 3x5 Supervision for safety; limited eccentric control   standing in II bars: Sidestepping 2x 1 min  marching 2x30"   fwd/back stepping with B UE support 10x each Red TB row 10x  Red TB shoulder extension 10x  standing hip extension 10x each  Flexed trunk; significant R hip instability. Visibly fatigued after several activities, requiring short sitting rest breaks.   Used visual targets to encourage more upright posture     PATIENT EDUCATION: Education details: discussed pt's falls and head trauma as well as upcoming appointments, advised her that she may have had a slight concussion; again encouraged getting life alert or similar device  Person educated: Patient Education method: Explanation Education comprehension: verbalized understanding     Access Code: [email or web address] URL: [email or web address] Date: 11/12/2023 Prepared by: [facility] - Outpatient  Rehab - [facility]  Exercises - Sit to Stand with Arms Crossed  - 1 x daily - 5 x weekly - 2 sets - 10 reps - Seated March with Resistance  - 1 x daily - 5 x weekly - 2 sets - 10 reps - Seated Scapular Retraction  - 1 x daily - 5 x weekly - 2 sets - 10 reps - 3 sec hold - Standing Hip Abduction with Counter Support  - 1 x daily - 5 x weekly - 2 sets - 10 reps - Seated Hip Abduction with Resistance  - 1 x daily - 5 x weekly - 3 sets - 10 reps - Seated Knee Extension with Resistance  - 1 x daily - 5 x weekly - 3 sets - 10 reps - Alternating Step forward-Balance Reaction  - 1 x daily - 7 x weekly - 1-2 sets - 5 reps - Supine Bridge  - 1 x daily - 5 x weekly - 2 sets - 10 reps - Supine Hamstring Stretch with Strap  - 1 x daily - 5 x weekly - 2 sets - 30 sec  hold - Bent Knee Fallouts  - 1 x daily - 5 x weekly - 3 sets - 10 reps - Seated Gluteal Sets  - 1 x daily - 5 x weekly - 3 sets - 10 reps - 3 sec hold     -------------------------------------------------- Note: Objective measures below were completed at Evaluation unless otherwise noted.  DIAGNOSTIC FINDINGS: 09/02/23 L lower leg xray:  No acute fracture. 2. Postsurgical changes of total left knee arthroplasty without evidence of hardware failure. 3. Moderate plantar calcaneal heel spur. 4. Possible wound within the proximal lateral calf soft tissues with patchy soft tissue sclerosis. Recommend clinical correlation. No cortical erosion is seen.  COGNITION: Overall cognitive status: Within functional limits for tasks assessed   SENSATION: Reports N/T in B feet  COORDINATION: Alternating pronation/supination: WNL B Alternating toe tap: WNL B Finger to nose: WNL B  POSTURE:  elevated shoulders, rounded posture, L lateral trunk flexion with R hip deviated further R ; fallen arch on L foot  LOWER EXTREMITY ROM:     Active  Right Eval Left Eval  Hip flexion    Hip extension    Hip abduction    Hip adduction    Hip internal rotation    Hip  external rotation    Knee flexion    Knee extension    Ankle dorsiflexion 23 9  Ankle plantarflexion    Ankle inversion    Ankle eversion     (Blank rows = not tested)  LOWER EXTREMITY MMT:    MMT (in sitting) Right Eval Left Eval  Hip flexion 4+ 3+  Hip extension    Hip abduction 4 4  Hip adduction 4+ 4  Hip internal rotation    Hip external rotation    Knee flexion 4 4  Knee extension 4+ 4  Ankle dorsiflexion 4+ 4  Ankle plantarflexion 4 3+  Ankle inversion    Ankle eversion    (Blank rows = not tested)   GAIT: Gait pattern: R hip instability and hip drop,  heavy anterior and L lean on walker Assistive device utilized: Walker - 4 wheeled Level of assistance: Modified independence   FUNCTIONAL TESTS:    2.38  ft/sec                                                                                                                               TREATMENT DATE: 10/17/23     PATIENT EDUCATION: Education details: discussion on possible "life alert" in case of falls, prognosis, POC, HEP Person educated: Patient Education method: Explanation, Demonstration, Tactile cues, Verbal cues, and Handouts Education comprehension: verbalized understanding and returned demonstration  HOME EXERCISE PROGRAM: Access Code: [email or web address] URL: [email or web address] Date: 10/17/2023 Prepared by: [facility] - Outpatient  Rehab - [facility]  Exercises - Sit to Stand with Arms Crossed  - 1 x daily - 5 x weekly - 2 sets - 10 reps - Seated March with Resistance  - 1 x daily - 5 x weekly - 2 sets - 10 reps - Seated Scapular Retraction  - 1 x daily - 5 x weekly - 2 sets - 10 reps - 3 sec hold - Standing Hip Abduction with Counter Support  - 1 x daily - 5 x weekly - 2 sets - 10 reps  GOALS: Goals reviewed with patient? Yes  SHORT TERM GOALS: Target date: 11/14/2023  Patient to be independent with initial HEP. Baseline: HEP initiated and reviewed Goal status: MET2/18/2025    LONG TERM GOALS: Target date: 12/12/2023  Patient to be independent with advanced HEP. Baseline: Not yet initiated  Goal status: IN PROGRESS  Patient to demonstrate B LE strength >/=4/5.  Baseline: See above Goal status: IN PROGRESS  Patient to report understanding of posture and body mechanics education for management of neck and LBP.  Baseline: not yet initiated  Goal status: IN PROGRESS  Patient to report putting plans to action in getting a life alert or other emergency communication system in case of falls.  Baseline: not yet in effect; discussed again 10/31/23 Goal status: IN PROGRESS  Patient to demonstrate gait speed of at least 2.63 ft/sec in order to improve access to community.   Baseline: 2.3  ft/sec Goal  status: IN PROGRESS  Patient to demonstrate 5xSTS test in <15 sec without use of UEs and with good eccentric control in order to decrease risk of falls.  Baseline: 13.73 sec pulling up on walker and with decreased eccentric control Goal status: IN PROGRESS  Patient to score at least 46/56 on Berg in order to decrease risk of falls.  Baseline: NT Goal status: IN PROGRESS  ASSESSMENT:  CLINICAL IMPRESSION: Patient arrived to session with questions on if she should continue with PT d/t continued falls. Advised that she should continue to address falls risk and to fd/u with PCP as scheduled to address HA's that are occurring since her recent fall. Patient denies dizziness, sudden N/T or weakness, syncope. Patient performed STS transfers with evident reduction in eccentric control upon sitting. Standing exercises required cueing for improvement in upright posture; R hip weakness and instability quite apparent in standing. Patient tolerated activities with short sitting rest breaks d/t fatigue. No complaints upon leaving.  OBJECTIVE IMPAIRMENTS: Abnormal gait, decreased activity tolerance, decreased balance, difficulty walking, decreased ROM, decreased strength, increased muscle spasms, impaired flexibility, postural dysfunction, and pain.   ACTIVITY LIMITATIONS: carrying, lifting, bending, sitting, standing, squatting, stairs, transfers, bathing, toileting, dressing, reach over head, hygiene/grooming, and locomotion level  PARTICIPATION LIMITATIONS: meal prep, cleaning, laundry, driving, shopping, community activity, and church  PERSONAL FACTORS: Age, Behavior pattern, Past/current experiences, Time since onset of injury/illness/exacerbation, and 3+ comorbidities: Anemia, CTS, CVA 2009, GERD, breast CA s/p mastectomy, HTN, HLD, Idiopathic pulmonary fibrosis, migraine, osteoporosis, peripheral neuropathy, polymyalgia rheumatica, seizures, temporal arteritis, DMII, cervical fusion 2010, lumbar fusion  2011, 2013, L TKA  are also affecting patient's functional outcome.   REHAB POTENTIAL: Good  CLINICAL DECISION MAKING: Evolving/moderate complexity  EVALUATION COMPLEXITY: Moderate  PLAN:  PT FREQUENCY: 2x/week  PT DURATION: 8 weeks  PLANNED INTERVENTIONS: 97164- PT Re-evaluation, 97110-Therapeutic exercises, 97530- Therapeutic activity, 97112- Neuromuscular re-education, 97535- Self Care, 16109- Manual therapy, [id number]- Gait training, [phone]- Aquatic Therapy, 97014- Electrical stimulation (unattended), Patient/Family education, Balance training, Stair training, Taping, Dry Needling, Joint mobilization, Spinal mobilization, Vestibular training, Cryotherapy, and Moist heat  PLAN FOR NEXT SESSION: Review update to HEP .  Continued work on balance strategies. Progress BLE strengthening including gluts and abductors, postural strengthening and stretching to address L trunk lean     Baldemar Friday, PT, DPT 11/21/23 11:50 AM  [facility] at [facility] [address] [place], [address] [place], [place] [address] Phone # [phone] Fax # [phone]

## 2023-11-19 NOTE — Therapy (Signed)
 OUTPATIENT PHYSICAL THERAPY NEURO TREATMENT NOTE   Patient Name: [name] MRN: [id number] DOB:Feb 01, 1941, 83 y.o., female Today's Date: 11/19/2023   PCP: Kristian Covey, MD  REFERRING PROVIDER: Kristian Covey, MD   END OF SESSION:  PT End of Session - 11/19/23 1104     Visit Number 7    Number of Visits 17    Date for PT Re-Evaluation 12/12/23    Authorization Type Medicare/Tricare    Progress Note Due on Visit 10    PT Start Time 1105    PT Stop Time 1147    PT Time Calculation (min) 42 min    Equipment Utilized During Treatment Gait belt    Activity Tolerance Patient tolerated treatment well    Behavior During Therapy WFL for tasks assessed/performed                   Past Medical History:  Diagnosis Date   Allergic rhinitis    Anemia    Anxiety    Bronchiectasis    Carpal tunnel syndrome 07/13/2015   Bilateral   CVA (cerebral infarction) 10/2007   Right thalamic    Diverticulosis of colon    Gait disorder    GERD (gastroesophageal reflux disease)    History of breast cancer    HTN (hypertension)    Hyperlipidemia    Idiopathic pulmonary fibrosis    Left knee DJD    Migraine    Osteoporosis    Peripheral neuropathy    PMR (polymyalgia rheumatica) (HCC)    Polyneuropathy in other diseases classified elsewhere (HCC) 02/17/2013   Previous back surgery 10/09/12   Renal insufficiency    Rheumatoid arthritis (HCC)    Seizure disorder (HCC)    Spondylosis, cervical, with myelopathy 08/31/2015   C3-4 myelopathy   Temporal arteritis (HCC)    Right eye blind, on steroids per [facility]eruro/ Dr Anne Hahn   Type II or unspecified type diabetes mellitus without mention of complication, not stated as uncontrolled    2nd to steriods   Vitamin D deficiency    Past Surgical History:  Procedure Laterality Date   APPENDECTOMY  01/2021   CARPAL TUNNEL RELEASE Right    CATARACT EXTRACTION     OS - Summer 2010   CERVICAL FUSION  09/24/2008   4 rods and  pins in place   CHOLECYSTECTOMY     COLONOSCOPY  12/15/2011   Procedure: COLONOSCOPY;  Surgeon: Charna Elizabeth, MD;  Location: [facility];  Service: Endoscopy;  Laterality: N/A;   LUMBAR FUSION  04/24/2010   W/Mechanical fixation - L2-5 [facility]   LUMBAR FUSION  09/25/2011   rods in hips (to stabilize).   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) 08/25/2019   Pain management 10/30/2017   Rheumatoid arthritis (HCC) 04/25/2017   S/P cervical spinal fusion 09/05/2015  Spondylosis, cervical, with myelopathy 08/31/2015   Carpal tunnel syndrome 07/13/2015   Torn ear lobe 06/07/2015   Obesity (BMI 30-39.9) 04/06/2014   Shortness of breath 07/14/2013   Bone cyst 07/14/2013   Polyneuropathy in other diseases classified elsewhere (HCC) 02/17/2013   Chronic back pain 12/09/2012   Peripheral edema 09/25/2012   Anemia due to chronic illness 09/01/2012   DDD (degenerative disc disease), lumbosacral 08/12/2012   Cervicalgia 07/11/2012   Disturbance of skin sensation 07/11/2012   Temporal arteritis (HCC) 04/01/2012   Lower GI bleed 12/24/2011   Rectal bleed 12/13/2011   Arthrodesis status 09/04/2011   Chronic renal insufficiency, stage II (mild) 04/04/2011   LUMBAR RADICULOPATHY, LEFT 04/18/2009   Essential hypertension 12/30/2007   SEIZURE DISORDER 12/30/2007   CEREBROVASCULAR ACCIDENT, HX OF 12/30/2007   Hyperlipidemia  10/08/2007   GERD 10/08/2007   DIVERTICULOSIS, COLON 10/08/2007   BRONCHIECTASIS 06/26/2007   PULMONARY FIBROSIS 05/17/2007   Osteoporosis 05/17/2007   BREAST CANCER, HX OF 05/17/2007   MASTECTOMY, LEFT, HX OF 05/17/2007    ONSET DATE: November 2024  REFERRING DIAG: R29.898 (ICD-10-CM) - Leg weakness, bilateral  THERAPY DIAG:  Other abnormalities of gait and mobility  Unsteadiness on feet  Muscle weakness (generalized)  Rationale for Evaluation and Treatment: Rehabilitation  SUBJECTIVE:                                                                                                                                                                                             SUBJECTIVE STATEMENT: I fell on Wednesday, got my feet caught up using my walker, from bedroom>bathroom and fell.  Hit the door and hit my head.  Have a knot on the back of my head.  Did not lose consciousness-have had a bad headache today, but no pain.  Pt reports concern over the number of falls she has had over the past couple of months. Pt accompanied by: self  PERTINENT HISTORY: Anemia, CTS, CVA 2009, GERD, breast CA s/p mastectomy, HTN, HLD, Idiopathic pulmonary fibrosis, migraine, osteoporosis, peripheral neuropathy, polymyalgia rheumatica, seizures, temporal arteritis, DMII, cervical fusion 2010, lumbar fusion 2011, 2013, L TKA  PAIN:  Are you having pain? Yes: NPRS scale: 0/10 Pain location: R hip Pain description: sore Aggravating factors: siting on it, touching it Relieving factors: nothing *No c/o headache noted at 11/19/2023 visit  PRECAUTIONS: Fall and Other: osteoporosis ; low vision R eye  RED FLAGS: None   WEIGHT BEARING RESTRICTIONS: No  FALLS: Has patient fallen in last 6 months? Yes. Number of falls 2  LIVING ENVIRONMENT: Lives with: lives alone Lives in: House/apartment Stairs:  ramp entry; 1  story with 14 steps into finished attic (reports that she is afraid to go up there) Has  following equipment at home: Quad cane small base, Walker - 4 wheeled, shower chair, Grab bars, and Ramped entry   PLOF: Independent and has a cleaning lady every 3 weeks; drives to [place] every weekend to assist in taking care of her sister who has dementia    PATIENT GOALS: improve leg strength   OBJECTIVE:   Pt has questions about recent falls:  discussed mechanism of fall last week, catching feet up with use of the rollator.  Discussed pt's hitting head and how whenever this happens, she absolutely needs to follow up with MD.  She does not report dizziness, fogginess or headache at session today, but does generally have more headaches since last week.    TODAY'S TREATMENT: 11/19/2023 Activity Comments  Vitals:  115/75, HR 76 bpm   Reviewed HEP and discussed holding off of resisted exercises until she follows up with MD (given recent fall with hitting head) Also requested pt hold on supine exercises pain in back of head She marked exercises to continue to do for home  Standing feet apart>partial tandem stance EO/EC 30 sec Min guard, increased sway with EC and most sway with EC partial tandem with LLE in posterior position  Heel toe raises x 10   Wide BOS lateral weightshift x 10   Short distance gait with rollator, min guard Near scissoring pattern with LLE crossing midline; cues for widening BOS and attending to L foot placement wider with gait    Access Code: [email or web address] URL: [email or web address] Date: 11/12/2023 Prepared by: [facility] - Outpatient  Rehab - [facility]  Exercises - Sit to Stand with Arms Crossed  - 1 x daily - 5 x weekly - 2 sets - 10 reps - Seated March with Resistance  - 1 x daily - 5 x weekly - 2 sets - 10 reps - Seated Scapular Retraction  - 1 x daily - 5 x weekly - 2 sets - 10 reps - 3 sec hold - Standing Hip Abduction with Counter Support  - 1 x daily - 5 x weekly - 2 sets - 10 reps - Seated Hip Abduction with Resistance  - 1 x daily - 5 x  weekly - 3 sets - 10 reps - Seated Knee Extension with Resistance  - 1 x daily - 5 x weekly - 3 sets - 10 reps - Alternating Step forward-Balance Reaction  - 1 x daily - 7 x weekly - 1-2 sets - 5 reps - Supine Bridge  - 1 x daily - 5 x weekly - 2 sets - 10 reps-HOLD 11/19/2023 - Supine Hamstring Stretch with Strap  - 1 x daily - 5 x weekly - 2 sets - 30 sec hold-HOLD 11/19/2023 - Bent Knee Fallouts  - 1 x daily - 5 x weekly - 3 sets - 10 reps - Seated Gluteal Sets  - 1 x daily - 5 x weekly - 3 sets - 10 reps - 3 sec hold    PATIENT EDUCATION: Education details: 11/19/2023 :Review of HEP update educated to HOLD on resisted ex and supine exercise; see above with education for pt to call Dr. Caryl Never to request to be seen due to recent fall and hitting posterior head as well as continued pain and tenderness at R hip following fall several weeks ago.   Person educated: Patient Education method: Explanation, Demonstration, Tactile cues, Verbal cues, and Handouts Education comprehension:  verbalized understanding and returned demonstration      -------------------------------------------------- Note: Objective measures below were completed at Evaluation unless otherwise noted.  DIAGNOSTIC FINDINGS: 09/02/23 L lower leg xray:  No acute fracture. 2. Postsurgical changes of total left knee arthroplasty without evidence of hardware failure. 3. Moderate plantar calcaneal heel spur. 4. Possible wound within the proximal lateral calf soft tissues with patchy soft tissue sclerosis. Recommend clinical correlation. No cortical erosion is seen.  COGNITION: Overall cognitive status: Within functional limits for tasks assessed   SENSATION: Reports N/T in B feet  COORDINATION: Alternating pronation/supination: WNL B Alternating toe tap: WNL B Finger to nose: WNL B  POSTURE:  elevated shoulders, rounded posture, L lateral trunk flexion with R hip deviated further R ; fallen arch on L foot  LOWER  EXTREMITY ROM:     Active  Right Eval Left Eval  Hip flexion    Hip extension    Hip abduction    Hip adduction    Hip internal rotation    Hip external rotation    Knee flexion    Knee extension    Ankle dorsiflexion 23 9  Ankle plantarflexion    Ankle inversion    Ankle eversion     (Blank rows = not tested)  LOWER EXTREMITY MMT:    MMT (in sitting) Right Eval Left Eval  Hip flexion 4+ 3+  Hip extension    Hip abduction 4 4  Hip adduction 4+ 4  Hip internal rotation    Hip external rotation    Knee flexion 4 4  Knee extension 4+ 4  Ankle dorsiflexion 4+ 4  Ankle plantarflexion 4 3+  Ankle inversion    Ankle eversion    (Blank rows = not tested)   GAIT: Gait pattern: R hip instability and hip drop,  heavy anterior and L lean on walker Assistive device utilized: Walker - 4 wheeled Level of assistance: Modified independence   FUNCTIONAL TESTS:    2.38 ft/sec                                                                                                                               TREATMENT DATE: 10/17/23     PATIENT EDUCATION: Education details: discussion on possible "life alert" in case of falls, prognosis, POC, HEP Person educated: Patient Education method: Explanation, Demonstration, Tactile cues, Verbal cues, and Handouts Education comprehension: verbalized understanding and returned demonstration  HOME EXERCISE PROGRAM: Access Code: [email or web address] URL: [email or web address] Date: 10/17/2023 Prepared by: [facility] - Outpatient  Rehab - [facility]  Exercises - Sit to Stand with Arms Crossed  - 1 x daily - 5 x weekly - 2 sets - 10 reps - Seated March with Resistance  - 1 x daily - 5 x weekly - 2 sets - 10 reps - Seated Scapular Retraction  - 1 x daily - 5 x weekly - 2 sets - 10 reps - 3 sec hold - Standing  Hip Abduction with Counter Support  - 1 x daily - 5 x weekly - 2 sets - 10 reps  GOALS: Goals reviewed with patient?  Yes  SHORT TERM GOALS: Target date: 11/14/2023  Patient to be independent with initial HEP. Baseline: HEP initiated and reviewed Goal status: MET2/18/2025    LONG TERM GOALS: Target date: 12/12/2023  Patient to be independent with advanced HEP. Baseline: Not yet initiated  Goal status: IN PROGRESS  Patient to demonstrate B LE strength >/=4/5.  Baseline: See above Goal status: IN PROGRESS  Patient to report understanding of posture and body mechanics education for management of neck and LBP.  Baseline: not yet initiated  Goal status: IN PROGRESS  Patient to report putting plans to action in getting a life alert or other emergency communication system in case of falls.  Baseline: not yet in effect; discussed again 10/31/23 Goal status: IN PROGRESS  Patient to demonstrate gait speed of at least 2.63 ft/sec in order to improve access to community.   Baseline: 2.3 ft/sec Goal status: IN PROGRESS  Patient to demonstrate 5xSTS test in <15 sec without use of UEs and with good eccentric control in order to decrease risk of falls.  Baseline: 13.73 sec pulling up on walker and with decreased eccentric control Goal status: IN PROGRESS  Patient to score at least 46/56 on Berg in order to decrease risk of falls.  Baseline: NT Goal status: IN PROGRESS  ASSESSMENT:  CLINICAL IMPRESSION: Pt presents today with reports of additional fall since last PT visit.  She reports having >10 falls in the past 2 months and at least 3 of those falls, she has hit her head.  Educated pt that she needs to follow up with Dr. Caryl Never, and neurologist if possible (she is scheduled to have EMG and NCV study today).  With vitals checked and WNL and no c/o pain today, we did proceed with a light session, discussing performing exercises without resistance for now, as she has been having headaches since most recent fall and PT doesn't want her performing resisted ex until she sees her MD.  Skilled PT session also  focused on standing balance exercises and pt has increased sway with EC and partial tandem stance; educated pt that she is not ready to do these at home yet, as she needs min guard assist for safety.  Pt will continue to benefit from skilled PT towards goals for improved functional mobility and decreased fall risk.   OBJECTIVE IMPAIRMENTS: Abnormal gait, decreased activity tolerance, decreased balance, difficulty walking, decreased ROM, decreased strength, increased muscle spasms, impaired flexibility, postural dysfunction, and pain.   ACTIVITY LIMITATIONS: carrying, lifting, bending, sitting, standing, squatting, stairs, transfers, bathing, toileting, dressing, reach over head, hygiene/grooming, and locomotion level  PARTICIPATION LIMITATIONS: meal prep, cleaning, laundry, driving, shopping, community activity, and church  PERSONAL FACTORS: Age, Behavior pattern, Past/current experiences, Time since onset of injury/illness/exacerbation, and 3+ comorbidities: Anemia, CTS, CVA 2009, GERD, breast CA s/p mastectomy, HTN, HLD, Idiopathic pulmonary fibrosis, migraine, osteoporosis, peripheral neuropathy, polymyalgia rheumatica, seizures, temporal arteritis, DMII, cervical fusion 2010, lumbar fusion 2011, 2013, L TKA  are also affecting patient's functional outcome.   REHAB POTENTIAL: Good  CLINICAL DECISION MAKING: Evolving/moderate complexity  EVALUATION COMPLEXITY: Moderate  PLAN:  PT FREQUENCY: 2x/week  PT DURATION: 8 weeks  PLANNED INTERVENTIONS: 97164- PT Re-evaluation, 97110-Therapeutic exercises, 97530- Therapeutic activity, [id number]- Neuromuscular re-education, 97535- Self Care, 16109- Manual therapy, [id number]- Gait training, [phone]- Aquatic Therapy, [phone]- Electrical stimulation (unattended), Patient/Family education, Balance training,  Stair training, Taping, Dry Needling, Joint mobilization, Spinal mobilization, Vestibular training, Cryotherapy, and Moist heat  PLAN FOR NEXT SESSION: Route  today's note to MD given additional recent falls; continued work on balance strategies. Progress BLE strengthening including gluts and abductors, postural strengthening and stretching to address L trunk lean.  Standing balance EO and EC, weightshifting lateral direction with wide BOS   Lonia Blood, PT 11/19/23 3:01 PM Phone: [phone] Fax: [phone]  [facility] at [facility] [address] [place], [address] [place], [place] [address] Phone # [phone] Fax # [phone]

## 2023-11-20 ENCOUNTER — Telehealth: Payer: Self-pay

## 2023-11-20 LAB — COMPREHENSIVE METABOLIC PANEL
ALT: 11 IU/L (ref 0–32)
AST: 23 IU/L (ref 0–40)
Albumin: 4.1 g/dL (ref 3.7–4.7)
Alkaline Phosphatase: 80 IU/L (ref 44–121)
BUN/Creatinine Ratio: 12 (ref 12–28)
BUN: 18 mg/dL (ref 8–27)
Bilirubin Total: 1.2 mg/dL (ref 0.0–1.2)
CO2: 27 mmol/L (ref 20–29)
Calcium: 9.6 mg/dL (ref 8.7–10.3)
Chloride: 100 mmol/L (ref 96–106)
Creatinine, Ser: 1.46 mg/dL — ABNORMAL HIGH (ref 0.57–1.00)
Globulin, Total: 2.7 g/dL (ref 1.5–4.5)
Glucose: 96 mg/dL (ref 70–99)
Potassium: 3.9 mmol/L (ref 3.5–5.2)
Sodium: 142 mmol/L (ref 134–144)
Total Protein: 6.8 g/dL (ref 6.0–8.5)
eGFR: 35 mL/min/{1.73_m2} — ABNORMAL LOW (ref >59)

## 2023-11-20 LAB — CBC WITH DIFFERENTIAL/PLATELET
Basophils Absolute: 0.1 10*3/uL (ref 0.0–0.2)
Basos: 1 %
EOS (ABSOLUTE): 0.1 10*3/uL (ref 0.0–0.4)
Eos: 2 %
Hematocrit: 39.1 % (ref 34.0–46.6)
Hemoglobin: 12.7 g/dL (ref 11.1–15.9)
Immature Grans (Abs): 0 10*3/uL (ref 0.0–0.1)
Immature Granulocytes: 0 %
Lymphocytes Absolute: 1.5 10*3/uL (ref 0.7–3.1)
Lymphs: 25 %
MCH: 32.5 pg (ref 26.6–33.0)
MCHC: 32.5 g/dL (ref 31.5–35.7)
MCV: 100 fL — ABNORMAL HIGH (ref 79–97)
Monocytes Absolute: 0.4 10*3/uL (ref 0.1–0.9)
Monocytes: 6 %
Neutrophils Absolute: 4 10*3/uL (ref 1.4–7.0)
Neutrophils: 66 %
Platelets: 266 10*3/uL (ref 150–450)
RBC: 3.91 x10E6/uL (ref 3.77–5.28)
RDW: 12.6 % (ref 11.7–15.4)
WBC: 6.1 10*3/uL (ref 3.4–10.8)

## 2023-11-20 LAB — C-REACTIVE PROTEIN: CRP: 8 mg/L (ref 0–10)

## 2023-11-20 LAB — TSH: TSH: 2.49 u[IU]/mL (ref 0.450–4.500)

## 2023-11-20 LAB — CK: Total CK: 144 U/L (ref 26–161)

## 2023-11-20 LAB — SEDIMENTATION RATE: Sed Rate: 16 mm/h (ref 0–40)

## 2023-11-20 NOTE — Telephone Encounter (Signed)
-----   Message from [name] sent at 11/19/2023  5:17 PM EST ----- Let's see if we can get in to be seen tomorrow if possible. ----- Message ----- From: [name], PT Sent: 11/19/2023   3:11 PM EST To: [name] Covey, MD  I am routing today's PT note, as I wanted you to be aware that [name] is continuing to fall.  She had a fall 11/13/23 where she hit her head on the door and has a knot on posterior aspect of her head.  She reports no LOC, but does report increased frequency of headaches since this fall.  She also continues to have a firm area of bruising on R hip that has not fully resolved from a fall several weeks ago.  I have encouraged her to communicate with your office to see you to let you know about her recent falls for your follow up recommendations.  Thank you.  [name], PT

## 2023-11-20 NOTE — Telephone Encounter (Signed)
 I spoke with the patient and she is unable to come in today but appt has been scheduled for 02/28

## 2023-11-21 ENCOUNTER — Encounter: Payer: Self-pay | Admitting: Physical Therapy

## 2023-11-21 ENCOUNTER — Ambulatory Visit: Payer: Medicare Other | Admitting: Physical Therapy

## 2023-11-21 ENCOUNTER — Telehealth: Payer: Self-pay | Admitting: Neurology

## 2023-11-21 DIAGNOSIS — M542 Cervicalgia: Secondary | ICD-10-CM | POA: Diagnosis not present

## 2023-11-21 DIAGNOSIS — M5459 Other low back pain: Secondary | ICD-10-CM | POA: Diagnosis not present

## 2023-11-21 DIAGNOSIS — R2689 Other abnormalities of gait and mobility: Secondary | ICD-10-CM | POA: Diagnosis not present

## 2023-11-21 DIAGNOSIS — R2681 Unsteadiness on feet: Secondary | ICD-10-CM

## 2023-11-21 DIAGNOSIS — M6281 Muscle weakness (generalized): Secondary | ICD-10-CM

## 2023-11-21 DIAGNOSIS — R293 Abnormal posture: Secondary | ICD-10-CM | POA: Diagnosis not present

## 2023-11-21 NOTE — Telephone Encounter (Signed)
 no auth required sent to [facility] [phone]

## 2023-11-22 ENCOUNTER — Ambulatory Visit (INDEPENDENT_AMBULATORY_CARE_PROVIDER_SITE_OTHER): Payer: Medicare Other | Admitting: Family Medicine

## 2023-11-22 ENCOUNTER — Encounter: Payer: Self-pay | Admitting: Family Medicine

## 2023-11-22 ENCOUNTER — Encounter: Payer: Self-pay | Admitting: Neurology

## 2023-11-22 VITALS — BP 134/76 | HR 87 | Temp 98.2°F | Wt 121.9 lb

## 2023-11-22 DIAGNOSIS — S0990XA Unspecified injury of head, initial encounter: Secondary | ICD-10-CM

## 2023-11-22 DIAGNOSIS — G4452 New daily persistent headache (NDPH): Secondary | ICD-10-CM

## 2023-11-22 DIAGNOSIS — R296 Repeated falls: Secondary | ICD-10-CM

## 2023-11-22 NOTE — Patient Instructions (Signed)
 AVOID regular use of Tylenol for the headaches.

## 2023-11-22 NOTE — Progress Notes (Signed)
 Established Patient Office Visit  Subjective   Patient ID: [name], female    DOB: [birth year]  Age: 83 y.o. MRN: [id number]  Chief Complaint  Patient presents with   Extremity Weakness    HPI   [name] is seen following multiple recent falls.  We got a note from physical therapy that she had fallen again recently and has had some persistent headaches.  She states that she had a fairly severe fall on 21 January when she went out to her mailbox and fell back and hit her head.  She then had a another fall on the 24th where she again struck her head.  She was actually seen in the ER on 25 January and CT of the head and cervical spine no acute findings.  She had another fall 9 February but no reported head injury.  On 19 February she fell in her bathroom as she was carrying some folded towels and hit her head against a doorknob.  No loss of consciousness.  She apparently had hematoma right occipital area which is still there but going down gradually.  She relates fairly continuous 5 out of 10 headache which is bifrontal.  No nausea or vomiting.  No worsening confusion.  No focal weakness.  She has been taking Tylenol pretty much daily.  She had recent nerve conduction through neurology which showed evidence for moderate axonal sensorimotor polyneuropathy, chronic right cervical and lumbosacral radiculopathy.  No evidence for intrinsic muscle disease.  She ambulates with a walker.  She frequently has gotten into trouble with falls when she was not using her walker and trying to carry out household chores.  No history of any recent syncope.  She does have chronic neck and back pain and on chronic OxyContin.  No benzodiazepine use.  She does take gabapentin 300 mg twice daily.  Past Medical History:  Diagnosis Date   Allergic rhinitis    Anemia    Anxiety    Bronchiectasis    Carpal tunnel syndrome 07/13/2015   Bilateral   CVA (cerebral infarction) 10/2007   Right thalamic     Diverticulosis of colon    Gait disorder    GERD (gastroesophageal reflux disease)    History of breast cancer    HTN (hypertension)    Hyperlipidemia    Idiopathic pulmonary fibrosis    Left knee DJD    Migraine    Osteoporosis    Peripheral neuropathy    PMR (polymyalgia rheumatica) (HCC)    Polyneuropathy in other diseases classified elsewhere (HCC) 02/17/2013   Previous back surgery 10/09/12   Renal insufficiency    Rheumatoid arthritis (HCC)    Seizure disorder (HCC)    Spondylosis, cervical, with myelopathy 08/31/2015   C3-4 myelopathy   Temporal arteritis (HCC)    Right eye blind, on steroids per [facility]er[facility]/ Dr Anne Hahn   Type II or unspecified type diabetes mellitus without mention of complication, not stated as uncontrolled    2nd to steriods   Vitamin D deficiency    Past Surgical History:  Procedure Laterality Date   APPENDECTOMY  01/2021   CARPAL TUNNEL RELEASE Right    CATARACT EXTRACTION     OS - Summer 2010   CERVICAL FUSION  09/24/2008   4 rods and pins in place   CHOLECYSTECTOMY     COLONOSCOPY  12/15/2011   Procedure: COLONOSCOPY;  Surgeon: Charna Elizabeth, MD;  Location: [facility];  Service: Endoscopy;  Laterality: N/A;   LUMBAR FUSION  04/24/2010   W/Mechanical fixation - L2-5 [facility]   LUMBAR FUSION  09/25/2011   rods in hips (to stabilize).   MASTECTOMY     NECK SURGERY     rheumatoid nodule removal     SPINAL FUSION  07/25/2010   T10-L2 interbody fusion / [facility]   TONSILLECTOMY     TOTAL KNEE ARTHROPLASTY     Left    reports that she has never smoked. She has never used smokeless tobacco. She reports that she does not currently use alcohol. She reports that she does not use drugs. family history includes Arthritis in her father and mother; Brain cancer in her mother; Breast cancer in her sister; Dementia in her father; Hypertension in her father and mother; Lung cancer in her sister. Allergies  Allergen Reactions   Atorvastatin  Nausea Only and Other (See Comments)    Dizzyness.    Hydromorphone Other (See Comments)    Cognitive changes  "made me unconscious" Dilaudid      Review of Systems  Constitutional:  Negative for fever.  Cardiovascular:  Negative for chest pain.  Neurological:  Positive for headaches. Negative for speech change, focal weakness, seizures and loss of consciousness.      Objective:     BP 134/76 (BP Location: Left Arm, Patient Position: Sitting, Cuff Size: Normal)   Pulse 87   Temp 98.2 F (36.8 C) (Oral)   Wt 121 lb 14.4 oz (55.3 kg)   SpO2 95%   BMI 23.03 kg/m  BP Readings from Last 3 Encounters:  11/22/23 134/76  10/28/23 (!) 154/76  10/19/23 (!) 156/92   Wt Readings from Last 3 Encounters:  11/22/23 121 lb 14.4 oz (55.3 kg)  10/28/23 124 lb 11.2 oz (56.6 kg)  10/19/23 122 lb (55.3 kg)      Physical Exam Vitals reviewed.  Constitutional:      General: She is not in acute distress.    Appearance: She is not ill-appearing.  HENT:     Head:     Comments: Small palpable hematoma right occipital region.  Nontender Cardiovascular:     Rate and Rhythm: Normal rate and regular rhythm.  Pulmonary:     Effort: Pulmonary effort is normal.     Breath sounds: Normal breath sounds.  Musculoskeletal:     Cervical back: Neck supple.  Neurological:     General: No focal deficit present.     Mental Status: She is alert.     Cranial Nerves: No cranial nerve deficit.     Motor: No weakness.      No results found for any visits on 11/22/23.    The ASCVD Risk score (Arnett DK, et al., 2019) failed to calculate for the following reasons:   The 2019 ASCVD risk score is only valid for ages 22 to 66    Assessment & Plan:   Recurrent falls with recent closed head injury.  She had CT of the head January 25 which showed no acute findings.  However, had another fall on 19 February striking her right occipital area.  She has had some persistent headaches since then.   Nonfocal neuroexam.  -Obtain CT head without contrast -Try to avoid daily regular use of Tylenol which could perpetuate headaches -Continue physical therapy -Discussed possible options for long-term chronic care.  She is becoming more unstable over time and needing more assistance   No follow-ups on file.    Evelena Peat, MD

## 2023-11-25 ENCOUNTER — Ambulatory Visit: Payer: Medicare Other | Admitting: Family Medicine

## 2023-11-25 NOTE — Therapy (Signed)
 OUTPATIENT PHYSICAL THERAPY NEURO TREATMENT NOTE   Patient Name: [name] MRN: [id number] DOB:1941-03-05, 83 y.o., female Today's Date: 11/26/2023   PCP: Kristian Covey, MD  REFERRING PROVIDER: Kristian Covey, MD   END OF SESSION:  PT End of Session - 11/26/23 1144     Visit Number 9    Number of Visits 17    Date for PT Re-Evaluation 12/12/23    Authorization Type Medicare/Tricare    Progress Note Due on Visit 10    PT Start Time 1103    PT Stop Time 1146    PT Time Calculation (min) 43 min    Equipment Utilized During Treatment Gait belt    Activity Tolerance Patient tolerated treatment well    Behavior During Therapy WFL for tasks assessed/performed                    Past Medical History:  Diagnosis Date   Allergic rhinitis    Anemia    Anxiety    Bronchiectasis    Carpal tunnel syndrome 07/13/2015   Bilateral   CVA (cerebral infarction) 10/2007   Right thalamic    Diverticulosis of colon    Gait disorder    GERD (gastroesophageal reflux disease)    History of breast cancer    HTN (hypertension)    Hyperlipidemia    Idiopathic pulmonary fibrosis    Left knee DJD    Migraine    Osteoporosis    Peripheral neuropathy    PMR (polymyalgia rheumatica) (HCC)    Polyneuropathy in other diseases classified elsewhere (HCC) 02/17/2013   Previous back surgery 10/09/12   Renal insufficiency    Rheumatoid arthritis (HCC)    Seizure disorder (HCC)    Spondylosis, cervical, with myelopathy 08/31/2015   C3-4 myelopathy   Temporal arteritis (HCC)    Right eye blind, on steroids per [facility]/ Dr Anne Hahn   Type II or unspecified type diabetes mellitus without mention of complication, not stated as uncontrolled    2nd to steriods   Vitamin D deficiency    Past Surgical History:  Procedure Laterality Date   APPENDECTOMY  01/2021   CARPAL TUNNEL RELEASE Right    CATARACT EXTRACTION     OS - Summer 2010   CERVICAL FUSION  09/24/2008   4 rods and  pins in place   CHOLECYSTECTOMY     COLONOSCOPY  12/15/2011   Procedure: COLONOSCOPY;  Surgeon: Charna Elizabeth, MD;  Location: [facility];  Service: Endoscopy;  Laterality: N/A;   LUMBAR FUSION  04/24/2010   W/Mechanical fixation - L2-5 [facility]   LUMBAR FUSION  09/25/2011   rods in hips (to stabilize).   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) 08/25/2019   Pain management 10/30/2017   Rheumatoid arthritis (HCC) 04/25/2017   S/P  cervical spinal fusion 09/05/2015   Spondylosis, cervical, with myelopathy 08/31/2015   Carpal tunnel syndrome 07/13/2015   Torn ear lobe 06/07/2015   Obesity (BMI 30-39.9) 04/06/2014   Shortness of breath 07/14/2013   Bone cyst 07/14/2013   Polyneuropathy in other diseases classified elsewhere (HCC) 02/17/2013   Chronic back pain 12/09/2012   Peripheral edema 09/25/2012   Anemia due to chronic illness 09/01/2012   DDD (degenerative disc disease), lumbosacral 08/12/2012   Cervicalgia 07/11/2012   Disturbance of skin sensation 07/11/2012   Temporal arteritis (HCC) 04/01/2012   Lower GI bleed 12/24/2011   Rectal bleed 12/13/2011   Arthrodesis status 09/04/2011   Chronic renal insufficiency, stage II (mild) 04/04/2011   LUMBAR RADICULOPATHY, LEFT 04/18/2009   Essential hypertension 12/30/2007   SEIZURE DISORDER 12/30/2007   CEREBROVASCULAR ACCIDENT, HX OF  12/30/2007   Hyperlipidemia 10/08/2007   GERD 10/08/2007   DIVERTICULOSIS, COLON 10/08/2007   BRONCHIECTASIS 06/26/2007   PULMONARY FIBROSIS 05/17/2007   Osteoporosis 05/17/2007   BREAST CANCER, HX OF 05/17/2007   MASTECTOMY, LEFT, HX OF 05/17/2007    ONSET DATE: November 2024  REFERRING DIAG: R29.898 (ICD-10-CM) - Leg weakness, bilateral  THERAPY DIAG:  Other abnormalities of gait and mobility  Unsteadiness on feet  Muscle weakness (generalized)  Rationale for Evaluation and Treatment: Rehabilitation  SUBJECTIVE:                                                                                                                                                                                             SUBJECTIVE STATEMENT: "I have to tell you that I haven't exercised any since last time." Reports that she was in [place] taking care of her sister. Reports that she is waiting for a call to get a catscan of her head. Reports continued HA's and R sided neck pain. Reports that PCP told her to limit Tylenol use for pain. Patient reports having another fall without injury since last session, had to have brother in law help her up.   Pt accompanied by: self  PERTINENT HISTORY: Anemia, CTS, CVA 2009, GERD, breast CA s/p mastectomy, HTN, HLD, Idiopathic pulmonary fibrosis, migraine, osteoporosis, peripheral neuropathy, polymyalgia rheumatica, seizures, temporal arteritis, DMII, cervical fusion 2010, lumbar fusion 2011, 2013, L TKA  PAIN:  Are you having pain? Yes: NPRS scale: 4-5/10 Pain location: forehead and R neck  Pain description: headache Aggravating factors: fall Relieving factors: meds  PRECAUTIONS: Fall and Other: osteoporosis ; low vision R eye  RED FLAGS: None   WEIGHT BEARING RESTRICTIONS: No  FALLS: Has patient fallen in last 6 months? Yes. Number of falls 2  LIVING ENVIRONMENT:  Lives with: lives alone Lives in: House/apartment Stairs:  ramp entry; 1 story with 14  steps into finished attic (reports that she is afraid to go up there) Has following equipment at home: Counselling psychologist, Walker - 4 wheeled, shower chair, Grab bars, and Ramped entry   PLOF: Independent and has a cleaning lady every 3 weeks; drives to [place] every weekend to assist in taking care of her sister who has dementia    PATIENT GOALS: improve leg strength   OBJECTIVE:     TODAY'S TREATMENT: 11/27/23 Activity Comments  STM and manual TPR  Soft tissue restriction over R UT, LS, SCM; pt reported improved neck pain but unchanged HA  palpation TTP over B anterior lower ribs; very small bruise over R side. Pt reports soreness since her fall  but denies SOB or chest pain  bridge Discontinued d/t c/o LBP  Hooklying glute set 5x5" Good tolerance   Alt bent knee fallout with red TB 2x10  Focus on isolating 1 LE at a time, core contraction   standing red TB paloff press 4WW in front ; cueing to reset posture; pt noted "stinging" in the midback after this activity which was improved with self- stretching             PATIENT EDUCATION: Education details: f/u discussion on lifealert (pt reports family in looking into getting her one) and addressed pt's concerns on moving to assisted living, provided suggestions on how to incorporate HEP into daily routine  Person educated: Patient Education method: Explanation Education comprehension: verbalized understanding    Access Code: [email or web address] URL: [email or web address] Date: 11/12/2023 Prepared by: [facility] - Outpatient  Rehab - [facility]  Exercises - Sit to Stand with Arms Crossed  - 1 x daily - 5 x weekly - 2 sets - 10 reps - Seated March with Resistance  - 1 x daily - 5 x weekly - 2 sets - 10 reps - Seated Scapular Retraction  - 1 x daily - 5 x weekly - 2 sets - 10 reps - 3 sec hold - Standing Hip Abduction with Counter Support  - 1 x daily - 5 x weekly - 2 sets - 10 reps - Seated Hip Abduction with Resistance   - 1 x daily - 5 x weekly - 3 sets - 10 reps - Seated Knee Extension with Resistance  - 1 x daily - 5 x weekly - 3 sets - 10 reps - Alternating Step forward-Balance Reaction  - 1 x daily - 7 x weekly - 1-2 sets - 5 reps - Supine Bridge  - 1 x daily - 5 x weekly - 2 sets - 10 reps - Supine Hamstring Stretch with Strap  - 1 x daily - 5 x weekly - 2 sets - 30 sec hold - Bent Knee Fallouts  - 1 x daily - 5 x weekly - 3 sets - 10 reps - Seated Gluteal Sets  - 1 x daily - 5 x weekly - 3 sets - 10 reps - 3 sec hold       -------------------------------------------------- Note: Objective measures below were completed at Evaluation unless otherwise noted.  DIAGNOSTIC FINDINGS: 09/02/23 L lower leg xray:  No acute fracture. 2. Postsurgical changes of total left knee arthroplasty without evidence of hardware failure. 3. Moderate plantar calcaneal heel spur. 4. Possible wound within the proximal lateral calf soft tissues with patchy soft tissue sclerosis. Recommend clinical correlation. No cortical erosion is seen.  COGNITION: Overall cognitive  status: Within functional limits for tasks assessed   SENSATION: Reports N/T in B feet  COORDINATION: Alternating pronation/supination: WNL B Alternating toe tap: WNL B Finger to nose: WNL B  POSTURE:  elevated shoulders, rounded posture, L lateral trunk flexion with R hip deviated further R ; fallen arch on L foot  LOWER EXTREMITY ROM:     Active  Right Eval Left Eval  Hip flexion    Hip extension    Hip abduction    Hip adduction    Hip internal rotation    Hip external rotation    Knee flexion    Knee extension    Ankle dorsiflexion 23 9  Ankle plantarflexion    Ankle inversion    Ankle eversion     (Blank rows = not tested)  LOWER EXTREMITY MMT:    MMT (in sitting) Right Eval Left Eval  Hip flexion 4+ 3+  Hip extension    Hip abduction 4 4  Hip adduction 4+ 4  Hip internal rotation    Hip external rotation    Knee  flexion 4 4  Knee extension 4+ 4  Ankle dorsiflexion 4+ 4  Ankle plantarflexion 4 3+  Ankle inversion    Ankle eversion    (Blank rows = not tested)   GAIT: Gait pattern: R hip instability and hip drop,  heavy anterior and L lean on walker Assistive device utilized: Walker - 4 wheeled Level of assistance: Modified independence   FUNCTIONAL TESTS:    2.38 ft/sec                                                                                                                               TREATMENT DATE: 10/17/23     PATIENT EDUCATION: Education details: discussion on possible "life alert" in case of falls, prognosis, POC, HEP Person educated: Patient Education method: Explanation, Demonstration, Tactile cues, Verbal cues, and Handouts Education comprehension: verbalized understanding and returned demonstration  HOME EXERCISE PROGRAM: Access Code: [email or web address] URL: [email or web address] Date: 10/17/2023 Prepared by: [facility] - Outpatient  Rehab - [facility]  Exercises - Sit to Stand with Arms Crossed  - 1 x daily - 5 x weekly - 2 sets - 10 reps - Seated March with Resistance  - 1 x daily - 5 x weekly - 2 sets - 10 reps - Seated Scapular Retraction  - 1 x daily - 5 x weekly - 2 sets - 10 reps - 3 sec hold - Standing Hip Abduction with Counter Support  - 1 x daily - 5 x weekly - 2 sets - 10 reps  GOALS: Goals reviewed with patient? Yes  SHORT TERM GOALS: Target date: 11/14/2023  Patient to be independent with initial HEP. Baseline: HEP initiated and reviewed Goal status: MET2/18/2025    LONG TERM GOALS: Target date: 12/12/2023  Patient to be independent with advanced HEP. Baseline: Not yet initiated  Goal status: IN PROGRESS  Patient to demonstrate  B LE strength >/=4/5.  Baseline: See above Goal status: IN PROGRESS  Patient to report understanding of posture and body mechanics education for management of neck and LBP.  Baseline: not yet  initiated  Goal status: IN PROGRESS  Patient to report putting plans to action in getting a life alert or other emergency communication system in case of falls.  Baseline: not yet in effect; discussed again 10/31/23 Goal status: IN PROGRESS  Patient to demonstrate gait speed of at least 2.63 ft/sec in order to improve access to community.   Baseline: 2.3 ft/sec Goal status: IN PROGRESS  Patient to demonstrate 5xSTS test in <15 sec without use of UEs and with good eccentric control in order to decrease risk of falls.  Baseline: 13.73 sec pulling up on walker and with decreased eccentric control Goal status: IN PROGRESS  Patient to score at least 46/56 on Berg in order to decrease risk of falls.  Baseline: NT Goal status: IN PROGRESS  ASSESSMENT:  CLINICAL IMPRESSION: Patient arrived to session with report of waiting for a call to get head CT. After discussion, patient admits to another fall, seemingly without injury however later mentioned B anterior rib  pain. This was TTP today and slightly bruised but patient denies SOB or rib pain. Reports continued HA and neck pain, thus proceeded with MT to address this. Patient reported improvement in neck pain after MT. Core strengthening was modified for max comfort. Patient reported improved HA upon leaving.   OBJECTIVE IMPAIRMENTS: Abnormal gait, decreased activity tolerance, decreased balance, difficulty walking, decreased ROM, decreased strength, increased muscle spasms, impaired flexibility, postural dysfunction, and pain.   ACTIVITY LIMITATIONS: carrying, lifting, bending, sitting, standing, squatting, stairs, transfers, bathing, toileting, dressing, reach over head, hygiene/grooming, and locomotion level  PARTICIPATION LIMITATIONS: meal prep, cleaning, laundry, driving, shopping, community activity, and church  PERSONAL FACTORS: Age, Behavior pattern, Past/current experiences, Time since onset of injury/illness/exacerbation, and 3+  comorbidities: Anemia, CTS, CVA 2009, GERD, breast CA s/p mastectomy, HTN, HLD, Idiopathic pulmonary fibrosis, migraine, osteoporosis, peripheral neuropathy, polymyalgia rheumatica, seizures, temporal arteritis, DMII, cervical fusion 2010, lumbar fusion 2011, 2013, L TKA  are also affecting patient's functional outcome.   REHAB POTENTIAL: Good  CLINICAL DECISION MAKING: Evolving/moderate complexity  EVALUATION COMPLEXITY: Moderate  PLAN:  PT FREQUENCY: 2x/week  PT DURATION: 8 weeks  PLANNED INTERVENTIONS: 97164- PT Re-evaluation, 97110-Therapeutic exercises, 97530- Therapeutic activity, 97112- Neuromuscular re-education, 97535- Self Care, 72536- Manual therapy, [phone]- Gait training, [phone]- Aquatic Therapy, 97014- Electrical stimulation (unattended), Patient/Family education, Balance training, Stair training, Taping, Dry Needling, Joint mobilization, Spinal mobilization, Vestibular training, Cryotherapy, and Moist heat  PLAN FOR NEXT SESSION: work on car transfers; Review update to HEP .  Continued work on balance strategies. Progress BLE strengthening including gluts and abductors, postural strengthening and stretching to address L trunk lean     Baldemar Friday, PT, DPT 11/26/23 11:47 AM  [facility] at [facility] [address] [place], [address] [place], [place] [address] Phone # [phone] Fax # [phone]

## 2023-11-26 ENCOUNTER — Encounter: Payer: Self-pay | Admitting: Physical Therapy

## 2023-11-26 ENCOUNTER — Ambulatory Visit: Payer: Medicare Other | Attending: Family Medicine | Admitting: Physical Therapy

## 2023-11-26 ENCOUNTER — Telehealth: Payer: Self-pay

## 2023-11-26 DIAGNOSIS — R2689 Other abnormalities of gait and mobility: Secondary | ICD-10-CM | POA: Diagnosis not present

## 2023-11-26 DIAGNOSIS — M6281 Muscle weakness (generalized): Secondary | ICD-10-CM | POA: Insufficient documentation

## 2023-11-26 DIAGNOSIS — R293 Abnormal posture: Secondary | ICD-10-CM | POA: Insufficient documentation

## 2023-11-26 DIAGNOSIS — R2681 Unsteadiness on feet: Secondary | ICD-10-CM | POA: Insufficient documentation

## 2023-11-26 DIAGNOSIS — M5459 Other low back pain: Secondary | ICD-10-CM | POA: Insufficient documentation

## 2023-11-26 NOTE — Telephone Encounter (Signed)
 Patient informed of [facility]ology number and will contact them in regards to her CT

## 2023-11-26 NOTE — Telephone Encounter (Signed)
 Copied from CRM [id number]. Topic: Referral - Question >> Nov 26, 2023  2:49 PM Kathryne Eriksson wrote: Reason for CRM: Requesting Call Back >> Nov 26, 2023  2:52 PM Kathryne Eriksson wrote: Patient states she believes she missed a call from [facility] in regards to getting her scheduled for her CT Scan. Patient doesn't have any contact information for that office, therefor she is requesting a call back with the following information: when / if she is already already scheduled as well as the provider she will be seeing and the phone number.

## 2023-11-27 NOTE — Progress Notes (Signed)
 EMG nerve conduction study report is under procedure tab

## 2023-11-27 NOTE — Therapy (Signed)
 OUTPATIENT PHYSICAL THERAPY NEURO PROGRESS NOTE/RE-CERT   Patient Name: [name] MRN: [id number] DOB:[birth year], 83 y.o., female Today's Date: 11/28/2023   PCP: Kristian Covey, MD  REFERRING PROVIDER: Kristian Covey, MD  Progress Note Reporting Period 10/17/23 to 11/28/23  See note below for Objective Data and Assessment of Progress/Goals.     END OF SESSION:  PT End of Session - 11/28/23 1234     Visit Number 10    Number of Visits 18    Date for PT Re-Evaluation 12/26/23    Authorization Type Medicare/Tricare    Progress Note Due on Visit 10    PT Start Time 1103    PT Stop Time 1145    PT Time Calculation (min) 42 min    Equipment Utilized During Treatment Gait belt    Activity Tolerance Patient tolerated treatment well    Behavior During Therapy WFL for tasks assessed/performed                     Past Medical History:  Diagnosis Date   Allergic rhinitis    Anemia    Anxiety    Bronchiectasis    Carpal tunnel syndrome 07/13/2015   Bilateral   CVA (cerebral infarction) 10/2007   Right thalamic    Diverticulosis of colon    Gait disorder    GERD (gastroesophageal reflux disease)    History of breast cancer    HTN (hypertension)    Hyperlipidemia    Idiopathic pulmonary fibrosis    Left knee DJD    Migraine    Osteoporosis    Peripheral neuropathy    PMR (polymyalgia rheumatica) (HCC)    Polyneuropathy in other diseases classified elsewhere (HCC) 02/17/2013   Previous back surgery 10/09/12   Renal insufficiency    Rheumatoid arthritis (HCC)    Seizure disorder (HCC)    Spondylosis, cervical, with myelopathy 08/31/2015   C3-4 myelopathy   Temporal arteritis (HCC)    Right eye blind, on steroids per [facility]/ Dr Anne Hahn   Type II or unspecified type diabetes mellitus without mention of complication, not stated as uncontrolled    2nd to steriods   Vitamin D deficiency    Past Surgical History:  Procedure Laterality Date    APPENDECTOMY  01/2021   CARPAL TUNNEL RELEASE Right    CATARACT EXTRACTION     OS - Summer 2010   CERVICAL FUSION  09/24/2008   4 rods and pins in place   CHOLECYSTECTOMY     COLONOSCOPY  12/15/2011   Procedure: COLONOSCOPY;  Surgeon: Charna Elizabeth, MD;  Location: [facility];  Service: Endoscopy;  Laterality: N/A;   LUMBAR FUSION  04/24/2010   W/Mechanical fixation - L2-5 [facility]   LUMBAR FUSION  09/25/2011   rods in hips (to stabilize).   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) 08/25/2019   Pain management 10/30/2017   Rheumatoid arthritis (HCC) 04/25/2017   S/P cervical spinal fusion 09/05/2015   Spondylosis, cervical, with myelopathy 08/31/2015   Carpal tunnel syndrome 07/13/2015   Torn ear lobe 06/07/2015   Obesity (BMI 30-39.9) 04/06/2014   Shortness of breath 07/14/2013   Bone cyst 07/14/2013   Polyneuropathy in other diseases classified elsewhere (HCC) 02/17/2013   Chronic back pain 12/09/2012   Peripheral edema 09/25/2012   Anemia due to chronic illness 09/01/2012   DDD (degenerative disc disease), lumbosacral 08/12/2012   Cervicalgia 07/11/2012   Disturbance of skin sensation 07/11/2012   Temporal arteritis (HCC) 04/01/2012   Lower GI bleed 12/24/2011   Rectal bleed 12/13/2011   Arthrodesis status 09/04/2011   Chronic renal insufficiency, stage II (mild) 04/04/2011   LUMBAR  RADICULOPATHY, LEFT 04/18/2009   Essential hypertension 12/30/2007   SEIZURE DISORDER 12/30/2007   CEREBROVASCULAR ACCIDENT, HX OF 12/30/2007   Hyperlipidemia 10/08/2007   GERD 10/08/2007   DIVERTICULOSIS, COLON 10/08/2007   BRONCHIECTASIS 06/26/2007   PULMONARY FIBROSIS 05/17/2007   Osteoporosis 05/17/2007   BREAST CANCER, HX OF 05/17/2007   MASTECTOMY, LEFT, HX OF 05/17/2007    ONSET DATE: November 2024  REFERRING DIAG: R29.898 (ICD-10-CM) - Leg weakness, bilateral  THERAPY DIAG:  Other abnormalities of gait and mobility  Unsteadiness on feet  Muscle weakness (generalized)  Abnormal posture  Rationale for Evaluation and Treatment: Rehabilitation  SUBJECTIVE:                                                                                                                                                                                             SUBJECTIVE STATEMENT: I'm tired, I've been working on my taxes. I've been kind of down. Denies recent falls. I have an appointment with Neurology coming up and I don't know what it's for, can you tell me?   Pt accompanied by: self  PERTINENT HISTORY: Anemia, CTS, CVA 2009, GERD, breast CA s/p mastectomy, HTN, HLD, Idiopathic pulmonary fibrosis, migraine, osteoporosis, peripheral neuropathy, polymyalgia rheumatica, seizures, temporal arteritis, DMII, cervical fusion 2010, lumbar fusion 2011, 2013, L TKA  PAIN:  Are you having pain? Yes: NPRS scale: 0/10 Pain location: forehead and R neck  Pain description: headache Aggravating factors: fall Relieving factors: meds  PRECAUTIONS: Fall and Other: osteoporosis ; low vision R eye  RED FLAGS: None   WEIGHT BEARING RESTRICTIONS: No  FALLS: Has patient fallen in last 6 months? Yes. Number of falls 2  LIVING ENVIRONMENT: Lives with: lives alone Lives in: House/apartment Stairs:  ramp entry; 1 story with 14 steps into  finished attic (reports that she is afraid to go up there) Has  following equipment at home: Counselling psychologist, Walker - 4 wheeled, shower chair, Grab bars, and Ramped entry   PLOF: Independent and has a cleaning lady every 3 weeks; drives to [place] every weekend to assist in taking care of her sister who has dementia    PATIENT GOALS: improve leg strength   OBJECTIVE:     TODAY'S TREATMENT: 11/28/23 Activity Comments  61M walk  15.33 sec with 4WW (2.14 ft/sec)  5xSTS 16.73 sec without UEs, posterior LOB into chair   Berg 35/56               LOWER EXTREMITY MMT:    MMT (in sitting) Right Eval Left Eval Right 11/28/23 Left 11/28/23  Hip flexion 4+ 3+ 4+ 4  Hip extension      Hip abduction 4 4 4+ (4 in sidelying) 4+  Hip adduction 4+ 4 4+ 4+  Hip internal rotation      Hip external rotation      Knee flexion 4 4 4  4+  Knee extension 4+ 4 4+ 4+  Ankle dorsiflexion 4+ 4 4+ 3+  Ankle plantarflexion 4 3+ 4 3+  Ankle inversion      Ankle eversion      (Blank rows = not tested)    [facility] PT Assessment - 11/28/23 0001       Berg Balance Test   Sit to Stand Able to stand without using hands and stabilize independently    Standing Unsupported Able to stand 2 minutes with supervision    Sitting with Back Unsupported but Feet Supported on Floor or Stool Able to sit safely and securely 2 minutes    Stand to Sit Sits safely with minimal use of hands    Transfers Able to transfer safely, definite need of hands    Standing Unsupported with Eyes Closed Able to stand 10 seconds with supervision    Standing Unsupported with Feet Together Able to place feet together independently and stand for 1 minute with supervision    From Standing, Reach Forward with Outstretched Arm Can reach forward >12 cm safely (5")    From Standing Position, Pick up Object from Floor Able to pick up shoe, needs supervision    From Standing Position, Turn to Look Behind Over each Shoulder Needs supervision when turning    Turn 360 Degrees Needs close supervision or verbal  cueing    Standing Unsupported, Alternately Place Feet on Step/Stool Needs assistance to keep from falling or unable to try   8 reps with 1 UE support on walker   Standing Unsupported, One Foot in Front Able to take small step independently and hold 30 seconds    Standing on One Leg Tries to lift leg/unable to hold 3 seconds but remains standing independently    Total Score 35               PATIENT EDUCATION: Education details: discussed objective measures, progress towards goals and remaining impairments, encouraged patient to f/u with her spine MD d/t report of worsening L trunk lean; POC Person educated: Patient Education method: Explanation and Demonstration Education comprehension: verbalized understanding      Access Code: [email or web address] URL: [email or web address] Date: 11/12/2023 Prepared by: [facility] - Outpatient  Rehab - [facility]  Exercises - Sit to Stand with Arms Crossed  - 1 x daily - 5 x weekly - 2 sets - 10 reps - Seated March  with Resistance  - 1 x daily - 5 x weekly - 2 sets - 10 reps - Seated Scapular Retraction  - 1 x daily - 5 x weekly - 2 sets - 10 reps - 3 sec hold - Standing Hip Abduction with Counter Support  - 1 x daily - 5 x weekly - 2 sets - 10 reps - Seated Hip Abduction with Resistance  - 1 x daily - 5 x weekly - 3 sets - 10 reps - Seated Knee Extension with Resistance  - 1 x daily - 5 x weekly - 3 sets - 10 reps - Alternating Step forward-Balance Reaction  - 1 x daily - 7 x weekly - 1-2 sets - 5 reps - Supine Bridge  - 1 x daily - 5 x weekly - 2 sets - 10 reps - Supine Hamstring Stretch with Strap  - 1 x daily - 5 x weekly - 2 sets - 30 sec hold - Bent Knee Fallouts  - 1 x daily - 5 x weekly - 3 sets - 10 reps - Seated Gluteal Sets  - 1 x daily - 5 x weekly - 3 sets - 10 reps - 3 sec hold       -------------------------------------------------- Note: Objective measures below were completed at Evaluation unless otherwise  noted.  DIAGNOSTIC FINDINGS: 09/02/23 L lower leg xray:  No acute fracture. 2. Postsurgical changes of total left knee arthroplasty without evidence of hardware failure. 3. Moderate plantar calcaneal heel spur. 4. Possible wound within the proximal lateral calf soft tissues with patchy soft tissue sclerosis. Recommend clinical correlation. No cortical erosion is seen.  COGNITION: Overall cognitive status: Within functional limits for tasks assessed   SENSATION: Reports N/T in B feet  COORDINATION: Alternating pronation/supination: WNL B Alternating toe tap: WNL B Finger to nose: WNL B  POSTURE:  elevated shoulders, rounded posture, L lateral trunk flexion with R hip deviated further R ; fallen arch on L foot  LOWER EXTREMITY ROM:     Active  Right Eval Left Eval  Hip flexion    Hip extension    Hip abduction    Hip adduction    Hip internal rotation    Hip external rotation    Knee flexion    Knee extension    Ankle dorsiflexion 23 9  Ankle plantarflexion    Ankle inversion    Ankle eversion     (Blank rows = not tested)  LOWER EXTREMITY MMT:    MMT (in sitting) Right Eval Left Eval  Hip flexion 4+ 3+  Hip extension    Hip abduction 4 4  Hip adduction 4+ 4  Hip internal rotation    Hip external rotation    Knee flexion 4 4  Knee extension 4+ 4  Ankle dorsiflexion 4+ 4  Ankle plantarflexion 4 3+  Ankle inversion    Ankle eversion    (Blank rows = not tested)   GAIT: Gait pattern: R hip instability and hip drop,  heavy anterior and L lean on walker Assistive device utilized: Walker - 4 wheeled Level of assistance: Modified independence   FUNCTIONAL TESTS:   [facility] PT Assessment - 11/28/23 0001       Berg Balance Test   Sit to Stand Able to stand without using hands and stabilize independently    Standing Unsupported Able to stand 2 minutes with supervision    Sitting with Back Unsupported but Feet Supported on Floor or Stool Able to sit safely  and  securely 2 minutes    Stand to Sit Sits safely with minimal use of hands    Transfers Able to transfer safely, definite need of hands    Standing Unsupported with Eyes Closed Able to stand 10 seconds with supervision    Standing Unsupported with Feet Together Able to place feet together independently and stand for 1 minute with supervision    From Standing, Reach Forward with Outstretched Arm Can reach forward >12 cm safely (5")    From Standing Position, Pick up Object from Floor Able to pick up shoe, needs supervision    From Standing Position, Turn to Look Behind Over each Shoulder Needs supervision when turning    Turn 360 Degrees Needs close supervision or verbal cueing    Standing Unsupported, Alternately Place Feet on Step/Stool Needs assistance to keep from falling or unable to try   8 reps with 1 UE support on walker   Standing Unsupported, One Foot in Front Able to take small step independently and hold 30 seconds    Standing on One Leg Tries to lift leg/unable to hold 3 seconds but remains standing independently    Total Score 35             2.38 ft/sec                                                                                                                               TREATMENT DATE: 10/17/23     PATIENT EDUCATION: Education details: discussion on possible "life alert" in case of falls, prognosis, POC, HEP Person educated: Patient Education method: Explanation, Demonstration, Tactile cues, Verbal cues, and Handouts Education comprehension: verbalized understanding and returned demonstration  HOME EXERCISE PROGRAM: Access Code: [email or web address] URL: [email or web address] Date: 10/17/2023 Prepared by: [facility] - Outpatient  Rehab - [facility]  Exercises - Sit to Stand with Arms Crossed  - 1 x daily - 5 x weekly - 2 sets - 10 reps - Seated March with Resistance  - 1 x daily - 5 x weekly - 2 sets - 10 reps - Seated Scapular Retraction  - 1 x  daily - 5 x weekly - 2 sets - 10 reps - 3 sec hold - Standing Hip Abduction with Counter Support  - 1 x daily - 5 x weekly - 2 sets - 10 reps  GOALS: Goals reviewed with patient? Yes  SHORT TERM GOALS: Target date: 11/14/2023  Patient to be independent with initial HEP. Baseline: HEP initiated and reviewed Goal status: MET2/18/2025    LONG TERM GOALS: Target date: 12/26/2023  Patient to be independent with advanced HEP. Baseline: Not yet initiated ; pt reports compliance and denies questions 11/28/23 Goal status: IN PROGRESS 11/28/23  Patient to demonstrate B LE strength >/=4/5.  Baseline: See above; improving but not quite met 11/28/23  Goal status: IN PROGRESS 11/28/23   Patient to report understanding of posture and body mechanics education for management of neck  and LBP.  Baseline: not yet initiated; not yet initiated 11/28/23 Goal status: IN PROGRESS 11/28/23  Patient to report putting plans to action in getting a life alert or other emergency communication system in case of falls.  Baseline: not yet in effect; discussed again 10/31/23; pt reported on 11/26/23 that family is looking into it for her  Goal status: IN PROGRESS 11/26/23   Patient to demonstrate gait speed of at least 2.63 ft/sec in order to improve access to community.   Baseline: 2.3 ft/sec; 15.33 sec with 4WW (2.14 ft/sec) 11/28/23 Goal status: IN PROGRESS 11/28/23  Patient to demonstrate 5xSTS test in <15 sec without use of UEs and with good eccentric control in order to decrease risk of falls.  Baseline: 13.73 sec pulling up on walker and with decreased eccentric control; 16.73 sec without UEs, posterior LOB into chair  11/28/23  Goal status: IN PROGRESS 11/28/23   Patient to score at least 46/56 on Berg in order to decrease risk of falls.  Baseline: 27> 35/56 11/28/23  Goal status: IN PROGRESS 11/28/23   ASSESSMENT:  CLINICAL IMPRESSION: Patient arrived to session with report of fatigue. Denies recent falls. Strength testing  revealed improvement in L hip flexion, B hip ABD, L hip ADD, L knee flexion/extension. Marked weakness evident in L ankle. Patient was able to complete 5xSTS test with less UE support required, however with posterior LOB into chair. Gait speed has decreased slightly since last session. Patient scored 35/56 on Berg, indicating an increased risk of falls but improved from initial assessment. Patient's significant L trunk lean when the R LE stabilizes is likely cause of patient's imbalance. She reports that this is worse than it was in the past, thus encouraged that she f/u with her spine MD. Patient is demonstrating good progress towards goals. Would benefit from additional skilled PT services 2x/week for 4 weeks to address remaining goals.   OBJECTIVE IMPAIRMENTS: Abnormal gait, decreased activity tolerance, decreased balance, difficulty walking, decreased ROM, decreased strength, increased muscle spasms, impaired flexibility, postural dysfunction, and pain.   ACTIVITY LIMITATIONS: carrying, lifting, bending, sitting, standing, squatting, stairs, transfers, bathing, toileting, dressing, reach over head, hygiene/grooming, and locomotion level  PARTICIPATION LIMITATIONS: meal prep, cleaning, laundry, driving, shopping, community activity, and church  PERSONAL FACTORS: Age, Behavior pattern, Past/current experiences, Time since onset of injury/illness/exacerbation, and 3+ comorbidities: Anemia, CTS, CVA 2009, GERD, breast CA s/p mastectomy, HTN, HLD, Idiopathic pulmonary fibrosis, migraine, osteoporosis, peripheral neuropathy, polymyalgia rheumatica, seizures, temporal arteritis, DMII, cervical fusion 2010, lumbar fusion 2011, 2013, L TKA  are also affecting patient's functional outcome.   REHAB POTENTIAL: Good  CLINICAL DECISION MAKING: Evolving/moderate complexity  EVALUATION COMPLEXITY: Moderate  PLAN:  PT FREQUENCY: 2x/week  PT DURATION: 4 weeks  PLANNED INTERVENTIONS: 97164- PT Re-evaluation,  97110-Therapeutic exercises, 97530- Therapeutic activity, 97112- Neuromuscular re-education, 97535- Self Care, 16109- Manual therapy, [phone]- Gait training, [phone]- Aquatic Therapy, 97014- Electrical stimulation (unattended), Patient/Family education, Balance training, Stair training, Taping, Dry Needling, Joint mobilization, Spinal mobilization, Vestibular training, Cryotherapy, and Moist heat  PLAN FOR NEXT SESSION: work on car transfers; strengthen R sidebody to avoid L trunk lean, provide posture and body mechanics edu Review update to HEP .  Continued work on balance strategies.      Baldemar Friday, PT, DPT 11/28/23 12:40 PM  [facility] at [facility] [address] [place], [address] [place], [place] [address] Phone # [phone] Fax # [phone]

## 2023-11-28 ENCOUNTER — Ambulatory Visit: Payer: Medicare Other | Admitting: Physical Therapy

## 2023-11-28 ENCOUNTER — Encounter: Payer: Self-pay | Admitting: Physical Therapy

## 2023-11-28 DIAGNOSIS — R293 Abnormal posture: Secondary | ICD-10-CM | POA: Diagnosis not present

## 2023-11-28 DIAGNOSIS — R2689 Other abnormalities of gait and mobility: Secondary | ICD-10-CM

## 2023-11-28 DIAGNOSIS — M6281 Muscle weakness (generalized): Secondary | ICD-10-CM | POA: Diagnosis not present

## 2023-11-28 DIAGNOSIS — R2681 Unsteadiness on feet: Secondary | ICD-10-CM | POA: Diagnosis not present

## 2023-11-28 DIAGNOSIS — M5459 Other low back pain: Secondary | ICD-10-CM | POA: Diagnosis not present

## 2023-11-29 NOTE — Therapy (Signed)
 OUTPATIENT PHYSICAL THERAPY NEURO NOTE   Patient Name: [name] MRN: [id number] DOB:Aug 29, 1941, 83 y.o., female Today's Date: 12/03/2023   PCP: Kristian Covey, MD  REFERRING PROVIDER: Kristian Covey, MD     END OF SESSION:  PT End of Session - 12/03/23 1145     Visit Number 11    Number of Visits 18    Date for PT Re-Evaluation 12/26/23    Authorization Type Medicare/Tricare    Progress Note Due on Visit 10    PT Start Time 1100    PT Stop Time 1144    PT Time Calculation (min) 44 min    Equipment Utilized During Treatment Gait belt    Activity Tolerance Patient tolerated treatment well    Behavior During Therapy WFL for tasks assessed/performed                      Past Medical History:  Diagnosis Date   Allergic rhinitis    Anemia    Anxiety    Bronchiectasis    Carpal tunnel syndrome 07/13/2015   Bilateral   CVA (cerebral infarction) 10/2007   Right thalamic    Diverticulosis of colon    Gait disorder    GERD (gastroesophageal reflux disease)    History of breast cancer    HTN (hypertension)    Hyperlipidemia    Idiopathic pulmonary fibrosis    Left knee DJD    Migraine    Osteoporosis    Peripheral neuropathy    PMR (polymyalgia rheumatica) (HCC)    Polyneuropathy in other diseases classified elsewhere (HCC) 02/17/2013   Previous back surgery 10/09/12   Renal insufficiency    Rheumatoid arthritis (HCC)    Seizure disorder (HCC)    Spondylosis, cervical, with myelopathy 08/31/2015   C3-4 myelopathy   Temporal arteritis (HCC)    Right eye blind, on steroids per [facility]/ Dr Anne Hahn   Type II or unspecified type diabetes mellitus without mention of complication, not stated as uncontrolled    2nd to steriods   Vitamin D deficiency    Past Surgical History:  Procedure Laterality Date   APPENDECTOMY  01/2021   CARPAL TUNNEL RELEASE Right    CATARACT EXTRACTION     OS - Summer 2010   CERVICAL FUSION  09/24/2008   4 rods and  pins in place   CHOLECYSTECTOMY     COLONOSCOPY  12/15/2011   Procedure: COLONOSCOPY;  Surgeon: Charna Elizabeth, MD;  Location: [facility];  Service: Endoscopy;  Laterality: N/A;   LUMBAR FUSION  04/24/2010   W/Mechanical fixation - L2-5 [facility]   LUMBAR FUSION  09/25/2011   rods in hips (to stabilize).   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) 08/25/2019   Pain management 10/30/2017   Rheumatoid arthritis (HCC) 04/25/2017  S/P cervical spinal fusion 09/05/2015   Spondylosis, cervical, with myelopathy 08/31/2015   Carpal tunnel syndrome 07/13/2015   Torn ear lobe 06/07/2015   Obesity (BMI 30-39.9) 04/06/2014   Shortness of breath 07/14/2013   Bone cyst 07/14/2013   Polyneuropathy in other diseases classified elsewhere (HCC) 02/17/2013   Chronic back pain 12/09/2012   Peripheral edema 09/25/2012   Anemia due to chronic illness 09/01/2012   DDD (degenerative disc disease), lumbosacral 08/12/2012   Cervicalgia 07/11/2012   Disturbance of skin sensation 07/11/2012   Temporal arteritis (HCC) 04/01/2012   Lower GI bleed 12/24/2011   Rectal bleed 12/13/2011   Arthrodesis status 09/04/2011   Chronic renal insufficiency, stage II (mild) 04/04/2011   LUMBAR RADICULOPATHY, LEFT 04/18/2009   Essential hypertension 12/30/2007   SEIZURE DISORDER 12/30/2007   CEREBROVASCULAR ACCIDENT, HX OF  12/30/2007   Hyperlipidemia 10/08/2007   GERD 10/08/2007   DIVERTICULOSIS, COLON 10/08/2007   BRONCHIECTASIS 06/26/2007   PULMONARY FIBROSIS 05/17/2007   Osteoporosis 05/17/2007   BREAST CANCER, HX OF 05/17/2007   MASTECTOMY, LEFT, HX OF 05/17/2007    ONSET DATE: November 2024  REFERRING DIAG: R29.898 (ICD-10-CM) - Leg weakness, bilateral  THERAPY DIAG:  Other abnormalities of gait and mobility  Unsteadiness on feet  Muscle weakness (generalized)  Abnormal posture  Other low back pain  Rationale for Evaluation and Treatment: Rehabilitation  SUBJECTIVE:                                                                                                                                                                                             SUBJECTIVE STATEMENT: Asking about a neurology appointment- not sure what is it for. Reports that she had some company over the weekend. Reports no HA today. Denies falls.    Pt accompanied by: self  PERTINENT HISTORY: Anemia, CTS, CVA 2009, GERD, breast CA s/p mastectomy, HTN, HLD, Idiopathic pulmonary fibrosis, migraine, osteoporosis, peripheral neuropathy, polymyalgia rheumatica, seizures, temporal arteritis, DMII, cervical fusion 2010, lumbar fusion 2011, 2013, L TKA  PAIN:  Are you having pain? Yes: NPRS scale: 0/10 Pain location: forehead and R neck  Pain description: headache Aggravating factors: fall Relieving factors: meds  PRECAUTIONS: Fall and Other: osteoporosis ; low vision R eye  RED FLAGS: None   WEIGHT BEARING RESTRICTIONS: No  FALLS: Has patient fallen in last 6 months? Yes. Number of falls 2  LIVING ENVIRONMENT: Lives with: lives alone Lives in: House/apartment Stairs:  ramp entry; 1 story with 14 steps into finished attic (reports that she is afraid to go up there) Has following equipment at home: Quad cane small base, Walker - 4 wheeled,  shower chair, Grab bars, and Ramped entry   PLOF: Independent and  has a cleaning lady every 3 weeks; drives to [place] every weekend to assist in taking care of her sister who has dementia    PATIENT GOALS: improve leg strength   OBJECTIVE:    TODAY'S TREATMENT: 12/03/23 Activity Comments  car transfers, gait, edu on safety  Curbs with 6OZ- advised to lock brakes first. Multiple car transfers and practice lifting 4WW, then RW into trunk, back seat, and front seat. Best solution with RW and placing it into the back seat. Educated on holding onto back seat door, walking around to front seat door. Practicd short gait with quad cane; encouraged to use in L hand for max safety./ Patient reported understanding      PATIENT EDUCATION: Education details: provided edu on safety and handout on take away tips on car transfers today Person educated: Patient Education method: Explanation, Demonstration, Tactile cues, Verbal cues, and Handouts Education comprehension: verbalized understanding and returned demonstration    Access Code: [email or web address] URL: [email or web address] Date: 11/12/2023 Prepared by: [facility] - Outpatient  Rehab - [facility]  Exercises - Sit to Stand with Arms Crossed  - 1 x daily - 5 x weekly - 2 sets - 10 reps - Seated March with Resistance  - 1 x daily - 5 x weekly - 2 sets - 10 reps - Seated Scapular Retraction  - 1 x daily - 5 x weekly - 2 sets - 10 reps - 3 sec hold - Standing Hip Abduction with Counter Support  - 1 x daily - 5 x weekly - 2 sets - 10 reps - Seated Hip Abduction with Resistance  - 1 x daily - 5 x weekly - 3 sets - 10 reps - Seated Knee Extension with Resistance  - 1 x daily - 5 x weekly - 3 sets - 10 reps - Alternating Step forward-Balance Reaction  - 1 x daily - 7 x weekly - 1-2 sets - 5 reps - Supine Bridge  - 1 x daily - 5 x weekly - 2 sets - 10 reps - Supine Hamstring Stretch with Strap  - 1 x daily - 5 x weekly - 2 sets - 30 sec hold - Bent Knee Fallouts  - 1 x daily - 5 x weekly - 3 sets - 10  reps - Seated Gluteal Sets  - 1 x daily - 5 x weekly - 3 sets - 10 reps - 3 sec hold       -------------------------------------------------- Note: Objective measures below were completed at Evaluation unless otherwise noted.  DIAGNOSTIC FINDINGS: 09/02/23 L lower leg xray:  No acute fracture. 2. Postsurgical changes of total left knee arthroplasty without evidence of hardware failure. 3. Moderate plantar calcaneal heel spur. 4. Possible wound within the proximal lateral calf soft tissues with patchy soft tissue sclerosis. Recommend clinical correlation. No cortical erosion is seen.  COGNITION: Overall cognitive status: Within functional limits for tasks assessed   SENSATION: Reports N/T in B feet  COORDINATION: Alternating pronation/supination: WNL B Alternating toe tap: WNL B Finger to nose: WNL B  POSTURE:  elevated shoulders, rounded posture, L lateral trunk flexion with R hip deviated further R ; fallen arch on L foot  LOWER EXTREMITY ROM:     Active  Right Eval Left Eval  Hip flexion    Hip extension    Hip abduction    Hip adduction    Hip internal rotation    Hip external  rotation    Knee flexion    Knee extension    Ankle dorsiflexion 23 9  Ankle plantarflexion    Ankle inversion    Ankle eversion     (Blank rows = not tested)  LOWER EXTREMITY MMT:    MMT (in sitting) Right Eval Left Eval  Hip flexion 4+ 3+  Hip extension    Hip abduction 4 4  Hip adduction 4+ 4  Hip internal rotation    Hip external rotation    Knee flexion 4 4  Knee extension 4+ 4  Ankle dorsiflexion 4+ 4  Ankle plantarflexion 4 3+  Ankle inversion    Ankle eversion    (Blank rows = not tested)   GAIT: Gait pattern: R hip instability and hip drop,  heavy anterior and L lean on walker Assistive device utilized: Walker - 4 wheeled Level of assistance: Modified independence   FUNCTIONAL TESTS:     2.38 ft/sec                                                                                                                                TREATMENT DATE: 10/17/23     PATIENT EDUCATION: Education details: discussion on possible "life alert" in case of falls, prognosis, POC, HEP Person educated: Patient Education method: Explanation, Demonstration, Tactile cues, Verbal cues, and Handouts Education comprehension: verbalized understanding and returned demonstration  HOME EXERCISE PROGRAM: Access Code: [email or web address] URL: [email or web address] Date: 10/17/2023 Prepared by: [facility] - Outpatient  Rehab - [facility]  Exercises - Sit to Stand with Arms Crossed  - 1 x daily - 5 x weekly - 2 sets - 10 reps - Seated March with Resistance  - 1 x daily - 5 x weekly - 2 sets - 10 reps - Seated Scapular Retraction  - 1 x daily - 5 x weekly - 2 sets - 10 reps - 3 sec hold - Standing Hip Abduction with Counter Support  - 1 x daily - 5 x weekly - 2 sets - 10 reps  GOALS: Goals reviewed with patient? Yes  SHORT TERM GOALS: Target date: 11/14/2023  Patient to be independent with initial HEP. Baseline: HEP initiated and reviewed Goal status: MET2/18/2025    LONG TERM GOALS: Target date: 12/26/2023  Patient to be independent with advanced HEP. Baseline: Not yet initiated ; pt reports compliance and denies questions 11/28/23 Goal status: IN PROGRESS 11/28/23  Patient to demonstrate B LE strength >/=4/5.  Baseline: See above; improving but not quite met 11/28/23  Goal status: IN PROGRESS 11/28/23   Patient to report understanding of posture and body mechanics education for management of neck and LBP.  Baseline: not yet initiated; not yet initiated 11/28/23 Goal status: IN PROGRESS 11/28/23  Patient to report putting plans to action in getting a life alert or other emergency communication system in case of falls.  Baseline: not yet in effect; discussed again 10/31/23; pt reported on 11/26/23 that  family is looking into it for her  Goal status: IN PROGRESS  11/26/23   Patient to demonstrate gait speed of at least 2.63 ft/sec in order to improve access to community.   Baseline: 2.3 ft/sec; 15.33 sec with 4WW (2.14 ft/sec) 11/28/23 Goal status: IN PROGRESS 11/28/23  Patient to demonstrate 5xSTS test in <15 sec without use of UEs and with good eccentric control in order to decrease risk of falls.  Baseline: 13.73 sec pulling up on walker and with decreased eccentric control; 16.73 sec without UEs, posterior LOB into chair  11/28/23  Goal status: IN PROGRESS 11/28/23   Patient to score at least 46/56 on Berg in order to decrease risk of falls.  Baseline: 27> 35/56 11/28/23  Goal status: IN PROGRESS 11/28/23   ASSESSMENT:  CLINICAL IMPRESSION: Patient arrived to session without new complaints. Session today focused on troubleshooting car transfers with 4WW. Patient has difficulty lifting this device, thus trialed with RW which was easier and safer. Thus encouraged purchasing a RW for shorting trips to store/appointment. Additional cueing provided for safety with gait. Patient reported understanding of all edu provided and without complaints upon leaving.   OBJECTIVE IMPAIRMENTS: Abnormal gait, decreased activity tolerance, decreased balance, difficulty walking, decreased ROM, decreased strength, increased muscle spasms, impaired flexibility, postural dysfunction, and pain.   ACTIVITY LIMITATIONS: carrying, lifting, bending, sitting, standing, squatting, stairs, transfers, bathing, toileting, dressing, reach over head, hygiene/grooming, and locomotion level  PARTICIPATION LIMITATIONS: meal prep, cleaning, laundry, driving, shopping, community activity, and church  PERSONAL FACTORS: Age, Behavior pattern, Past/current experiences, Time since onset of injury/illness/exacerbation, and 3+ comorbidities: Anemia, CTS, CVA 2009, GERD, breast CA s/p mastectomy, HTN, HLD, Idiopathic pulmonary fibrosis, migraine, osteoporosis, peripheral neuropathy, polymyalgia rheumatica,  seizures, temporal arteritis, DMII, cervical fusion 2010, lumbar fusion 2011, 2013, L TKA  are also affecting patient's functional outcome.   REHAB POTENTIAL: Good  CLINICAL DECISION MAKING: Evolving/moderate complexity  EVALUATION COMPLEXITY: Moderate  PLAN:  PT FREQUENCY: 2x/week  PT DURATION: 4 weeks  PLANNED INTERVENTIONS: 97164- PT Re-evaluation, 97110-Therapeutic exercises, 97530- Therapeutic activity, 97112- Neuromuscular re-education, 97535- Self Care, 16109- Manual therapy, [phone]- Gait training, [phone]- Aquatic Therapy, 97014- Electrical stimulation (unattended), Patient/Family education, Balance training, Stair training, Taping, Dry Needling, Joint mobilization, Spinal mobilization, Vestibular training, Cryotherapy, and Moist heat  PLAN FOR NEXT SESSION: has she gotten a RW? strengthen R sidebody to avoid L trunk lean, provide posture and body mechanics edu Review update to HEP .  Continued work on balance strategies.      Baldemar Friday, PT, DPT 12/03/23 11:46 AM  [facility] at [facility] [address] [place], [address] [place], [place] [address] Phone # [phone] Fax # [phone]

## 2023-12-03 ENCOUNTER — Encounter: Payer: Self-pay | Admitting: Physical Therapy

## 2023-12-03 ENCOUNTER — Ambulatory Visit (HOSPITAL_BASED_OUTPATIENT_CLINIC_OR_DEPARTMENT_OTHER)
Admission: RE | Admit: 2023-12-03 | Discharge: 2023-12-03 | Disposition: A | Discharge status: Home / Self Care | Source: Ambulatory Visit | Attending: Family Medicine | Admitting: Family Medicine

## 2023-12-03 ENCOUNTER — Ambulatory Visit: Payer: Medicare Other | Admitting: Physical Therapy

## 2023-12-03 DIAGNOSIS — R293 Abnormal posture: Secondary | ICD-10-CM | POA: Diagnosis not present

## 2023-12-03 DIAGNOSIS — S0990XA Unspecified injury of head, initial encounter: Secondary | ICD-10-CM | POA: Insufficient documentation

## 2023-12-03 DIAGNOSIS — M5459 Other low back pain: Secondary | ICD-10-CM

## 2023-12-03 DIAGNOSIS — R2689 Other abnormalities of gait and mobility: Secondary | ICD-10-CM

## 2023-12-03 DIAGNOSIS — M6281 Muscle weakness (generalized): Secondary | ICD-10-CM

## 2023-12-03 DIAGNOSIS — I6782 Cerebral ischemia: Secondary | ICD-10-CM | POA: Diagnosis not present

## 2023-12-03 DIAGNOSIS — R2681 Unsteadiness on feet: Secondary | ICD-10-CM | POA: Diagnosis not present

## 2023-12-03 NOTE — Patient Instructions (Signed)
 Safety Tips: Remember to lock your brakes before going up and down curbs Use your quad cane in the L hand when walking into the grocery store Purchase a rolling walker and keep it in your back seat When going into a quick appointment/grocery run, use the rolling walker and place it back into the back seat, holding onto the back seat door, then the front seat door to get back into the driver's seat

## 2023-12-04 ENCOUNTER — Encounter: Payer: Medicare Other | Admitting: Neurology

## 2023-12-05 ENCOUNTER — Ambulatory Visit: Payer: Medicare Other | Admitting: Physical Therapy

## 2023-12-09 ENCOUNTER — Telehealth: Payer: Self-pay

## 2023-12-09 NOTE — Telephone Encounter (Signed)
 Copied from CRM [id number]. Topic: Clinical - Lab/Test Results >> Dec 09, 2023  1:54 PM Theodis Sato wrote: Reason for CRM: Patient is requesting a phone call back about CT results from 3/11 - As she has not heard any updates.

## 2023-12-10 ENCOUNTER — Ambulatory Visit: Admitting: Physical Therapy

## 2023-12-10 ENCOUNTER — Encounter: Payer: Self-pay | Admitting: Physical Therapy

## 2023-12-10 DIAGNOSIS — R2689 Other abnormalities of gait and mobility: Secondary | ICD-10-CM

## 2023-12-10 DIAGNOSIS — Z79899 Other long term (current) drug therapy: Secondary | ICD-10-CM | POA: Diagnosis not present

## 2023-12-10 DIAGNOSIS — R2681 Unsteadiness on feet: Secondary | ICD-10-CM | POA: Diagnosis not present

## 2023-12-10 DIAGNOSIS — R293 Abnormal posture: Secondary | ICD-10-CM

## 2023-12-10 DIAGNOSIS — M6281 Muscle weakness (generalized): Secondary | ICD-10-CM | POA: Diagnosis not present

## 2023-12-10 DIAGNOSIS — M5459 Other low back pain: Secondary | ICD-10-CM | POA: Diagnosis not present

## 2023-12-10 DIAGNOSIS — M0589 Other rheumatoid arthritis with rheumatoid factor of multiple sites: Secondary | ICD-10-CM | POA: Diagnosis not present

## 2023-12-10 NOTE — Therapy (Unsigned)
 OUTPATIENT PHYSICAL THERAPY NEURO NOTE   Patient Name: [name] MRN: [id number] DOB:1941-03-13, 83 y.o., female Today's Date: 12/11/2023   PCP: Kristian Covey, MD  REFERRING PROVIDER: Kristian Covey, MD     END OF SESSION:  PT End of Session - 12/10/23 1229     Visit Number 12    Number of Visits 18    Date for PT Re-Evaluation 12/26/23    Authorization Type Medicare/Tricare    PT Start Time 1232    PT Stop Time 1314    PT Time Calculation (min) 42 min    Equipment Utilized During Treatment Gait belt    Activity Tolerance Patient tolerated treatment well    Behavior During Therapy WFL for tasks assessed/performed                      Past Medical History:  Diagnosis Date   Allergic rhinitis    Anemia    Anxiety    Bronchiectasis    Carpal tunnel syndrome 07/13/2015   Bilateral   CVA (cerebral infarction) 10/2007   Right thalamic    Diverticulosis of colon    Gait disorder    GERD (gastroesophageal reflux disease)    History of breast cancer    HTN (hypertension)    Hyperlipidemia    Idiopathic pulmonary fibrosis    Left knee DJD    Migraine    Osteoporosis    Peripheral neuropathy    PMR (polymyalgia rheumatica) (HCC)    Polyneuropathy in other diseases classified elsewhere (HCC) 02/17/2013   Previous back surgery 10/09/12   Renal insufficiency    Rheumatoid arthritis (HCC)    Seizure disorder (HCC)    Spondylosis, cervical, with myelopathy 08/31/2015   C3-4 myelopathy   Temporal arteritis (HCC)    Right eye blind, on steroids per [facility]/ Dr Anne Hahn   Type II or unspecified type diabetes mellitus without mention of complication, not stated as uncontrolled    2nd to steriods   Vitamin D deficiency    Past Surgical History:  Procedure Laterality Date   APPENDECTOMY  01/2021   CARPAL TUNNEL RELEASE Right    CATARACT EXTRACTION     OS - Summer 2010   CERVICAL FUSION  09/24/2008   4 rods and pins in place   CHOLECYSTECTOMY      COLONOSCOPY  12/15/2011   Procedure: COLONOSCOPY;  Surgeon: Charna Elizabeth, MD;  Location: [facility];  Service: Endoscopy;  Laterality: N/A;   LUMBAR FUSION  04/24/2010   W/Mechanical fixation - L2-5 [facility]   LUMBAR FUSION  09/25/2011   rods in hips (to stabilize).   MASTECTOMY     NECK SURGERY     rheumatoid nodule removal     SPINAL FUSION  07/25/2010   T10-L2 interbody fusion / [facility]   TONSILLECTOMY     TOTAL KNEE ARTHROPLASTY     Left   Patient Active Problem List   Diagnosis Date Noted   Hematoma 11/19/2023   Mild cognitive impairment 07/04/2023   Pain due to onychomycosis of toenails of both feet 05/13/2023   Confusion 08/07/2022   TIA (transient ischemic attack) 08/02/2022   Gait disorder    Headache syndrome 01/03/2021   Subjective visual disturbance, left eye 12/29/2020   Exostosis of left foot 06/28/2020   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) 08/25/2019   Pain management 10/30/2017   Rheumatoid arthritis (HCC) 04/25/2017   S/P cervical spinal fusion 09/05/2015  Spondylosis, cervical, with myelopathy 08/31/2015   Carpal tunnel syndrome 07/13/2015   Torn ear lobe 06/07/2015   Obesity (BMI 30-39.9) 04/06/2014   Shortness of breath 07/14/2013   Bone cyst 07/14/2013   Polyneuropathy in other diseases classified elsewhere (HCC) 02/17/2013   Chronic back pain 12/09/2012   Peripheral edema 09/25/2012   Anemia due to chronic illness 09/01/2012   DDD (degenerative disc disease), lumbosacral 08/12/2012   Cervicalgia 07/11/2012   Disturbance of skin sensation 07/11/2012   Temporal arteritis (HCC) 04/01/2012   Lower GI bleed 12/24/2011   Rectal bleed 12/13/2011   Arthrodesis status 09/04/2011   Chronic renal insufficiency, stage II (mild) 04/04/2011   LUMBAR RADICULOPATHY, LEFT 04/18/2009   Essential hypertension 12/30/2007   SEIZURE DISORDER 12/30/2007   CEREBROVASCULAR ACCIDENT, HX OF 12/30/2007   Hyperlipidemia 10/08/2007    GERD 10/08/2007   DIVERTICULOSIS, COLON 10/08/2007   BRONCHIECTASIS 06/26/2007   PULMONARY FIBROSIS 05/17/2007   Osteoporosis 05/17/2007   BREAST CANCER, HX OF 05/17/2007   MASTECTOMY, LEFT, HX OF 05/17/2007    ONSET DATE: November 2024  REFERRING DIAG: R29.898 (ICD-10-CM) - Leg weakness, bilateral  THERAPY DIAG:  Other abnormalities of gait and mobility  Unsteadiness on feet  Abnormal posture  Muscle weakness (generalized)  Rationale for Evaluation and Treatment: Rehabilitation  SUBJECTIVE:                                                                                                                                                                                             SUBJECTIVE STATEMENT: No new falls.  Had the CT scan of my head, but haven't gotten the results back yet.  Pt accompanied by: self  PERTINENT HISTORY: Anemia, CTS, CVA 2009, GERD, breast CA s/p mastectomy, HTN, HLD, Idiopathic pulmonary fibrosis, migraine, osteoporosis, peripheral neuropathy, polymyalgia rheumatica, seizures, temporal arteritis, DMII, cervical fusion 2010, lumbar fusion 2011, 2013, L TKA  PAIN:  Are you having pain? Yes: NPRS scale: 0/10 Pain location: forehead and R neck  Pain description: headache Aggravating factors: fall Relieving factors: meds  PRECAUTIONS: Fall and Other: osteoporosis ; low vision R eye  RED FLAGS: None   WEIGHT BEARING RESTRICTIONS: No  FALLS: Has patient fallen in last 6 months? Yes. Number of falls 2  LIVING ENVIRONMENT: Lives with: lives alone Lives in: House/apartment Stairs:  ramp entry; 1 story with 14 steps into finished attic (reports that she is afraid to go up there) Has following equipment at home: Quad cane small base, Walker - 4 wheeled, shower chair, Grab bars, and Ramped entry   PLOF: Independent and has a cleaning lady every 3 weeks; drives to [employer]  every weekend to assist in taking care of her sister who has dementia    PATIENT  GOALS: improve leg strength   OBJECTIVE:    TODAY'S TREATMENT: 12/10/23 Activity Comments  Reviewed glut sets, 10 reps Good form  Seated abdominal activation  10 reps with alternating arm lifts BUE lifts x 10 Alt march + opposite arm lifts x 10    Cues to avoid posterior lean  Seated forward lean to upright posture 2 x 10 Seated lateral lean and reach 2 x 10   Sidestepping x 2 min BUE support  Wide BOS lateral weightshift + reach, then weightshift + lift + reach   Side step/together R/L x 10 BUE support  Sit<>stand 2 x 5 reps    Access Code: [email or web address] URL: [email or web address] Date: 12/10/2023 Prepared by: [facility] - Outpatient  Rehab - [facility]  Exercises - Sit to Stand with Arms Crossed  - 1 x daily - 5 x weekly - 2 sets - 10 reps - Seated March with Resistance  - 1 x daily - 5 x weekly - 2 sets - 10 reps - Seated Scapular Retraction  - 1 x daily - 5 x weekly - 2 sets - 10 reps - 3 sec hold - Standing Hip Abduction with Counter Support  - 1 x daily - 5 x weekly - 2 sets - 10 reps - Seated Hip Abduction with Resistance  - 1 x daily - 5 x weekly - 3 sets - 10 reps - Seated Knee Extension with Resistance  - 1 x daily - 5 x weekly - 3 sets - 10 reps - Alternating Step forward-Balance Reaction  - 1 x daily - 7 x weekly - 1-2 sets - 5 reps - Supine Hamstring Stretch with Strap  - 1 x daily - 5 x weekly - 2 sets - 30 sec hold - Bent Knee Fallouts  - 1 x daily - 5 x weekly - 3 sets - 10 reps - Seated Gluteal Sets  - 1 x daily - 5 x weekly - 3 sets - 10 reps - 3 sec hold - Side Stepping with Counter Support  - 1 x daily - 5 x weekly - 3 sets - 10 reps   PATIENT EDUCATION: Education details: HEP additions Person educated: Patient Education method: Explanation, Demonstration, and Handouts Education comprehension: verbalized understanding and returned demonstration     -------------------------------------------------- Note: Objective measures below  were completed at Evaluation unless otherwise noted.  DIAGNOSTIC FINDINGS: 09/02/23 L lower leg xray:  No acute fracture. 2. Postsurgical changes of total left knee arthroplasty without evidence of hardware failure. 3. Moderate plantar calcaneal heel spur. 4. Possible wound within the proximal lateral calf soft tissues with patchy soft tissue sclerosis. Recommend clinical correlation. No cortical erosion is seen.  COGNITION: Overall cognitive status: Within functional limits for tasks assessed   SENSATION: Reports N/T in B feet  COORDINATION: Alternating pronation/supination: WNL B Alternating toe tap: WNL B Finger to nose: WNL B  POSTURE:  elevated shoulders, rounded posture, L lateral trunk flexion with R hip deviated further R ; fallen arch on L foot  LOWER EXTREMITY ROM:     Active  Right Eval Left Eval  Hip flexion    Hip extension    Hip abduction    Hip adduction    Hip internal rotation    Hip external rotation    Knee flexion    Knee extension    Ankle dorsiflexion 23 9  Ankle plantarflexion  Ankle inversion    Ankle eversion     (Blank rows = not tested)  LOWER EXTREMITY MMT:    MMT (in sitting) Right Eval Left Eval  Hip flexion 4+ 3+  Hip extension    Hip abduction 4 4  Hip adduction 4+ 4  Hip internal rotation    Hip external rotation    Knee flexion 4 4  Knee extension 4+ 4  Ankle dorsiflexion 4+ 4  Ankle plantarflexion 4 3+  Ankle inversion    Ankle eversion    (Blank rows = not tested)   GAIT: Gait pattern: R hip instability and hip drop,  heavy anterior and L lean on walker Assistive device utilized: Walker - 4 wheeled Level of assistance: Modified independence   FUNCTIONAL TESTS:     2.38 ft/sec                                                                                                                               TREATMENT DATE: 10/17/23     PATIENT EDUCATION: Education details: discussion on possible "life alert"  in case of falls, prognosis, POC, HEP Person educated: Patient Education method: Explanation, Demonstration, Tactile cues, Verbal cues, and Handouts Education comprehension: verbalized understanding and returned demonstration  HOME EXERCISE PROGRAM: Access Code: [email or web address] URL: [email or web address] Date: 10/17/2023 Prepared by: [facility] - Outpatient  Rehab - [facility]  Exercises - Sit to Stand with Arms Crossed  - 1 x daily - 5 x weekly - 2 sets - 10 reps - Seated March with Resistance  - 1 x daily - 5 x weekly - 2 sets - 10 reps - Seated Scapular Retraction  - 1 x daily - 5 x weekly - 2 sets - 10 reps - 3 sec hold - Standing Hip Abduction with Counter Support  - 1 x daily - 5 x weekly - 2 sets - 10 reps  GOALS: Goals reviewed with patient? Yes  SHORT TERM GOALS: Target date: 11/14/2023  Patient to be independent with initial HEP. Baseline: HEP initiated and reviewed Goal status: MET2/18/2025    LONG TERM GOALS: Target date: 12/26/2023  Patient to be independent with advanced HEP. Baseline: Not yet initiated ; pt reports compliance and denies questions 11/28/23 Goal status: IN PROGRESS 11/28/23  Patient to demonstrate B LE strength >/=4/5.  Baseline: See above; improving but not quite met 11/28/23  Goal status: IN PROGRESS 11/28/23   Patient to report understanding of posture and body mechanics education for management of neck and LBP.  Baseline: not yet initiated; not yet initiated 11/28/23 Goal status: IN PROGRESS 11/28/23  Patient to report putting plans to action in getting a life alert or other emergency communication system in case of falls.  Baseline: not yet in effect; discussed again 10/31/23; pt reported on 11/26/23 that family is looking into it for her  Goal status: IN PROGRESS 11/26/23   Patient to demonstrate gait speed of at least 2.63  ft/sec in order to improve access to community.   Baseline: 2.3 ft/sec; 15.33 sec with 4WW (2.14 ft/sec) 11/28/23 Goal  status: IN PROGRESS 11/28/23  Patient to demonstrate 5xSTS test in <15 sec without use of UEs and with good eccentric control in order to decrease risk of falls.  Baseline: 13.73 sec pulling up on walker and with decreased eccentric control; 16.73 sec without UEs, posterior LOB into chair  11/28/23  Goal status: IN PROGRESS 11/28/23   Patient to score at least 46/56 on Berg in order to decrease risk of falls.  Baseline: 27> 35/56 11/28/23  Goal status: IN PROGRESS 11/28/23   ASSESSMENT:  CLINICAL IMPRESSION: Pt presents today with no new complaints, no new falls.  Skilled PT session focused on trunk and core stability/activation and dynamic balance.  Pt seems to like trunk exercises, and tolerates them well; PT wants to make sure they don't bother her back and then may add them into her HEP.  Pt needs UE support for stability for counter balance exercises. Pt will continue to benefit from skilled PT towards goals for improved functional mobility and decreased fall risk.   OBJECTIVE IMPAIRMENTS: Abnormal gait, decreased activity tolerance, decreased balance, difficulty walking, decreased ROM, decreased strength, increased muscle spasms, impaired flexibility, postural dysfunction, and pain.   ACTIVITY LIMITATIONS: carrying, lifting, bending, sitting, standing, squatting, stairs, transfers, bathing, toileting, dressing, reach over head, hygiene/grooming, and locomotion level  PARTICIPATION LIMITATIONS: meal prep, cleaning, laundry, driving, shopping, community activity, and church  PERSONAL FACTORS: Age, Behavior pattern, Past/current experiences, Time since onset of injury/illness/exacerbation, and 3+ comorbidities: Anemia, CTS, CVA 2009, GERD, breast CA s/p mastectomy, HTN, HLD, Idiopathic pulmonary fibrosis, migraine, osteoporosis, peripheral neuropathy, polymyalgia rheumatica, seizures, temporal arteritis, DMII, cervical fusion 2010, lumbar fusion 2011, 2013, L TKA  are also affecting patient's  functional outcome.   REHAB POTENTIAL: Good  CLINICAL DECISION MAKING: Evolving/moderate complexity  EVALUATION COMPLEXITY: Moderate  PLAN:  PT FREQUENCY: 2x/week  PT DURATION: 4 weeks  PLANNED INTERVENTIONS: 97164- PT Re-evaluation, 97110-Therapeutic exercises, 97530- Therapeutic activity, 97112- Neuromuscular re-education, 97535- Self Care, 96295- Manual therapy, [phone]- Gait training, [id number]- Aquatic Therapy, 97014- Electrical stimulation (unattended), Patient/Family education, Balance training, Stair training, Taping, Dry Needling, Joint mobilization, Spinal mobilization, Vestibular training, Cryotherapy, and Moist heat  PLAN FOR NEXT SESSION: Review HEP update and review work on car transfers; strengthen R sidebody to avoid L trunk lean, provide posture and body mechanics edu.  Continued work on balance strategies.      Lonia Blood, PT 12/11/23 8:22 AM Phone: [phone] Fax: [phone]  [facility] at [facility] [address] [place], [address] [place], [place] [address] Phone # [phone] Fax # [phone]

## 2023-12-11 NOTE — Therapy (Signed)
 OUTPATIENT PHYSICAL THERAPY NEURO NOTE   Patient Name: [name] MRN: [id number] DOB:[birth year], 83 y.o., female Today's Date: 12/12/2023   PCP: Kristian Covey, MD  REFERRING PROVIDER: Kristian Covey, MD     END OF SESSION:  PT End of Session - 12/12/23 1157     Visit Number 13    Number of Visits 18    Date for PT Re-Evaluation 12/26/23    Authorization Type Medicare/Tricare    PT Start Time 1113    PT Stop Time 1155    PT Time Calculation (min) 42 min    Equipment Utilized During Treatment Gait belt    Activity Tolerance Patient tolerated treatment well    Behavior During Therapy WFL for tasks assessed/performed                       Past Medical History:  Diagnosis Date   Allergic rhinitis    Anemia    Anxiety    Bronchiectasis    Carpal tunnel syndrome 07/13/2015   Bilateral   CVA (cerebral infarction) 10/2007   Right thalamic    Diverticulosis of colon    Gait disorder    GERD (gastroesophageal reflux disease)    History of breast cancer    HTN (hypertension)    Hyperlipidemia    Idiopathic pulmonary fibrosis    Left knee DJD    Migraine    Osteoporosis    Peripheral neuropathy    PMR (polymyalgia rheumatica) (HCC)    Polyneuropathy in other diseases classified elsewhere (HCC) 02/17/2013   Previous back surgery 10/09/12   Renal insufficiency    Rheumatoid arthritis (HCC)    Seizure disorder (HCC)    Spondylosis, cervical, with myelopathy 08/31/2015   C3-4 myelopathy   Temporal arteritis (HCC)    Right eye blind, on steroids per [facility]/ Dr Anne Hahn   Type II or unspecified type diabetes mellitus without mention of complication, not stated as uncontrolled    2nd to steriods   Vitamin D deficiency    Past Surgical History:  Procedure Laterality Date   APPENDECTOMY  01/2021   CARPAL TUNNEL RELEASE Right    CATARACT EXTRACTION     OS - Summer 2010   CERVICAL FUSION  09/24/2008   4 rods and pins in place    CHOLECYSTECTOMY     COLONOSCOPY  12/15/2011   Procedure: COLONOSCOPY;  Surgeon: Charna Elizabeth, MD;  Location: [facility];  Service: Endoscopy;  Laterality: N/A;   LUMBAR FUSION  04/24/2010   W/Mechanical fixation - L2-5 [facility]   LUMBAR FUSION  09/25/2011   rods in hips (to stabilize).   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) 08/25/2019   Pain management 10/30/2017   Rheumatoid arthritis (HCC) 04/25/2017   S/P cervical spinal fusion 09/05/2015  Spondylosis, cervical, with myelopathy 08/31/2015   Carpal tunnel syndrome 07/13/2015   Torn ear lobe 06/07/2015   Obesity (BMI 30-39.9) 04/06/2014   Shortness of breath 07/14/2013   Bone cyst 07/14/2013   Polyneuropathy in other diseases classified elsewhere (HCC) 02/17/2013   Chronic back pain 12/09/2012   Peripheral edema 09/25/2012   Anemia due to chronic illness 09/01/2012   DDD (degenerative disc disease), lumbosacral 08/12/2012   Cervicalgia 07/11/2012   Disturbance of skin sensation 07/11/2012   Temporal arteritis (HCC) 04/01/2012   Lower GI bleed 12/24/2011   Rectal bleed 12/13/2011   Arthrodesis status 09/04/2011   Chronic renal insufficiency, stage II (mild) 04/04/2011   LUMBAR RADICULOPATHY, LEFT 04/18/2009   Essential hypertension 12/30/2007   SEIZURE DISORDER 12/30/2007   CEREBROVASCULAR ACCIDENT, HX OF 12/30/2007    Hyperlipidemia 10/08/2007   GERD 10/08/2007   DIVERTICULOSIS, COLON 10/08/2007   BRONCHIECTASIS 06/26/2007   PULMONARY FIBROSIS 05/17/2007   Osteoporosis 05/17/2007   BREAST CANCER, HX OF 05/17/2007   MASTECTOMY, LEFT, HX OF 05/17/2007    ONSET DATE: November 2024  REFERRING DIAG: R29.898 (ICD-10-CM) - Leg weakness, bilateral  THERAPY DIAG:  Other abnormalities of gait and mobility  Unsteadiness on feet  Abnormal posture  Muscle weakness (generalized)  Rationale for Evaluation and Treatment: Rehabilitation  SUBJECTIVE:                                                                                                                                                                                             SUBJECTIVE STATEMENT: Everything seems to be doing good. Got CT results back and they looked good. Reports that HA's are better. Denies falls since last session.    Pt accompanied by: self  PERTINENT HISTORY: Anemia, CTS, CVA 2009, GERD, breast CA s/p mastectomy, HTN, HLD, Idiopathic pulmonary fibrosis, migraine, osteoporosis, peripheral neuropathy, polymyalgia rheumatica, seizures, temporal arteritis, DMII, cervical fusion 2010, lumbar fusion 2011, 2013, L TKA  PAIN:  Are you having pain? Yes: NPRS scale: 0/10 Pain location: forehead and R neck  Pain description: headache Aggravating factors: fall Relieving factors: meds  PRECAUTIONS: Fall and Other: osteoporosis ; low vision R eye  RED FLAGS: None   WEIGHT BEARING RESTRICTIONS: No  FALLS: Has patient fallen in last 6 months? Yes. Number of falls 2  LIVING ENVIRONMENT: Lives with: lives alone Lives in: House/apartment Stairs:  ramp entry; 1 story with 14 steps into finished attic (reports that she is afraid to go up there) Has following equipment at home: Quad cane small base, Walker - 4 wheeled, shower chair, Grab bars, and Ramped entry   PLOF: Independent and has a  cleaning lady every 3 weeks; drives to  [place] every weekend to assist in taking care of her sister who has dementia    PATIENT GOALS: improve leg strength   OBJECTIVE:    Head CT IMPRESSION: 12/03/23 1. No evidence of acute intracranial abnormality. 2. Moderate chronic small vessel ischemic disease.   TODAY'S TREATMENT: 12/12/23 Activity Comments  standing R trunk lean, then with 5# in L hand, then in R hand CGA-min A for stability; manual guidance to avoid flexion and focus on sidebending. 1 LOB requiring min A  standing red TB paloff press 10x each  CGA for safety; cues to look ahead to avoid trunk flexion   standing red TB trunk rotation 10x each  CGA for safety; c/o some L sided LBP, thus stopped after about 8 reps. Pt with more difficulty maintaining upright posture with this activity    Sitting trunk rotation To tolerance   sidestepping with TB above knees Along counter; SBA for safety     PATIENT EDUCATION: Education details: followed up on life alert and RW- pt reports that she is looking into borrowing a RW from a friend, reports that she will call about life alert today Person educated: Patient Education method: Explanation Education comprehension: verbalized understanding    Access Code: [email or web address] URL: [email or web address] Date: 12/10/2023 Prepared by: [facility] - Outpatient  Rehab - [facility]  Exercises - Sit to Stand with Arms Crossed  - 1 x daily - 5 x weekly - 2 sets - 10 reps - Seated March with Resistance  - 1 x daily - 5 x weekly - 2 sets - 10 reps - Seated Scapular Retraction  - 1 x daily - 5 x weekly - 2 sets - 10 reps - 3 sec hold - Standing Hip Abduction with Counter Support  - 1 x daily - 5 x weekly - 2 sets - 10 reps - Seated Hip Abduction with Resistance  - 1 x daily - 5 x weekly - 3 sets - 10 reps - Seated Knee Extension with Resistance  - 1 x daily - 5 x weekly - 3 sets - 10 reps - Alternating Step forward-Balance Reaction  - 1 x daily - 7 x weekly - 1-2 sets - 5  reps - Supine Hamstring Stretch with Strap  - 1 x daily - 5 x weekly - 2 sets - 30 sec hold - Bent Knee Fallouts  - 1 x daily - 5 x weekly - 3 sets - 10 reps - Seated Gluteal Sets  - 1 x daily - 5 x weekly - 3 sets - 10 reps - 3 sec hold - Side Stepping with Counter Support  - 1 x daily - 5 x weekly - 3 sets - 10 reps     -------------------------------------------------- Note: Objective measures below were completed at Evaluation unless otherwise noted.  DIAGNOSTIC FINDINGS: 09/02/23 L lower leg xray:  No acute fracture. 2. Postsurgical changes of total left knee arthroplasty without evidence of hardware failure. 3. Moderate plantar calcaneal heel spur. 4. Possible wound within the proximal lateral calf soft tissues with patchy soft tissue sclerosis. Recommend clinical correlation. No cortical erosion is seen.  COGNITION: Overall cognitive status: Within functional limits for tasks assessed   SENSATION: Reports N/T in B feet  COORDINATION: Alternating pronation/supination: WNL B Alternating toe tap: WNL B Finger to nose: WNL B  POSTURE:  elevated shoulders, rounded posture, L lateral trunk flexion with R hip deviated further R ; fallen arch on L  foot  LOWER EXTREMITY ROM:     Active  Right Eval Left Eval  Hip flexion    Hip extension    Hip abduction    Hip adduction    Hip internal rotation    Hip external rotation    Knee flexion    Knee extension    Ankle dorsiflexion 23 9  Ankle plantarflexion    Ankle inversion    Ankle eversion     (Blank rows = not tested)  LOWER EXTREMITY MMT:    MMT (in sitting) Right Eval Left Eval  Hip flexion 4+ 3+  Hip extension    Hip abduction 4 4  Hip adduction 4+ 4  Hip internal rotation    Hip external rotation    Knee flexion 4 4  Knee extension 4+ 4  Ankle dorsiflexion 4+ 4  Ankle plantarflexion 4 3+  Ankle inversion    Ankle eversion    (Blank rows = not tested)   GAIT: Gait pattern: R hip instability and  hip drop,  heavy anterior and L lean on walker Assistive device utilized: Walker - 4 wheeled Level of assistance: Modified independence   FUNCTIONAL TESTS:     2.38 ft/sec                                                                                                                               TREATMENT DATE: 10/17/23     PATIENT EDUCATION: Education details: discussion on possible "life alert" in case of falls, prognosis, POC, HEP Person educated: Patient Education method: Explanation, Demonstration, Tactile cues, Verbal cues, and Handouts Education comprehension: verbalized understanding and returned demonstration  HOME EXERCISE PROGRAM: Access Code: [email or web address] URL: [email or web address] Date: 10/17/2023 Prepared by: [facility] - Outpatient  Rehab - [facility]  Exercises - Sit to Stand with Arms Crossed  - 1 x daily - 5 x weekly - 2 sets - 10 reps - Seated March with Resistance  - 1 x daily - 5 x weekly - 2 sets - 10 reps - Seated Scapular Retraction  - 1 x daily - 5 x weekly - 2 sets - 10 reps - 3 sec hold - Standing Hip Abduction with Counter Support  - 1 x daily - 5 x weekly - 2 sets - 10 reps  GOALS: Goals reviewed with patient? Yes  SHORT TERM GOALS: Target date: 11/14/2023  Patient to be independent with initial HEP. Baseline: HEP initiated and reviewed Goal status: MET2/18/2025    LONG TERM GOALS: Target date: 12/26/2023  Patient to be independent with advanced HEP. Baseline: Not yet initiated ; pt reports compliance and denies questions 11/28/23 Goal status: IN PROGRESS 11/28/23  Patient to demonstrate B LE strength >/=4/5.  Baseline: See above; improving but not quite met 11/28/23  Goal status: IN PROGRESS 11/28/23   Patient to report understanding of posture and body mechanics education for management of neck and LBP.  Baseline: not yet initiated; not  yet initiated 11/28/23 Goal status: IN PROGRESS 11/28/23  Patient to report putting plans to  action in getting a life alert or other emergency communication system in case of falls.  Baseline: not yet in effect; discussed again 10/31/23; pt reported on 11/26/23 that family is looking into it for her  Goal status: IN PROGRESS 11/26/23   Patient to demonstrate gait speed of at least 2.63 ft/sec in order to improve access to community.   Baseline: 2.3 ft/sec; 15.33 sec with 4WW (2.14 ft/sec) 11/28/23 Goal status: IN PROGRESS 11/28/23  Patient to demonstrate 5xSTS test in <15 sec without use of UEs and with good eccentric control in order to decrease risk of falls.  Baseline: 13.73 sec pulling up on walker and with decreased eccentric control; 16.73 sec without UEs, posterior LOB into chair  11/28/23  Goal status: IN PROGRESS 11/28/23   Patient to score at least 46/56 on Berg in order to decrease risk of falls.  Baseline: 27> 35/56 11/28/23  Goal status: IN PROGRESS 11/28/23   ASSESSMENT:  CLINICAL IMPRESSION: Patient arrived to session with improvement in HA's, no recent falls, and normal recent head CT. Session today focused on dynamic standing balance while performing core and trunk strengthening activities. Patient required at least CGA throughout for safety d/t imbalance. Did note some back discomfort with trunk rotation, thus this was discontinued. Verbal cueing provided to maintain tall upright posture. Reported back is "a little tired but it's not hurting" at end of session   OBJECTIVE IMPAIRMENTS: Abnormal gait, decreased activity tolerance, decreased balance, difficulty walking, decreased ROM, decreased strength, increased muscle spasms, impaired flexibility, postural dysfunction, and pain.   ACTIVITY LIMITATIONS: carrying, lifting, bending, sitting, standing, squatting, stairs, transfers, bathing, toileting, dressing, reach over head, hygiene/grooming, and locomotion level  PARTICIPATION LIMITATIONS: meal prep, cleaning, laundry, driving, shopping, community activity, and church  PERSONAL  FACTORS: Age, Behavior pattern, Past/current experiences, Time since onset of injury/illness/exacerbation, and 3+ comorbidities: Anemia, CTS, CVA 2009, GERD, breast CA s/p mastectomy, HTN, HLD, Idiopathic pulmonary fibrosis, migraine, osteoporosis, peripheral neuropathy, polymyalgia rheumatica, seizures, temporal arteritis, DMII, cervical fusion 2010, lumbar fusion 2011, 2013, L TKA  are also affecting patient's functional outcome.   REHAB POTENTIAL: Good  CLINICAL DECISION MAKING: Evolving/moderate complexity  EVALUATION COMPLEXITY: Moderate  PLAN:  PT FREQUENCY: 2x/week  PT DURATION: 4 weeks  PLANNED INTERVENTIONS: 97164- PT Re-evaluation, 97110-Therapeutic exercises, 97530- Therapeutic activity, 97112- Neuromuscular re-education, 97535- Self Care, [id number]- Manual therapy, [phone]- Gait training, [phone]- Aquatic Therapy, 97014- Electrical stimulation (unattended), Patient/Family education, Balance training, Stair training, Taping, Dry Needling, Joint mobilization, Spinal mobilization, Vestibular training, Cryotherapy, and Moist heat  PLAN FOR NEXT SESSION: Review HEP update and review work on car transfers; strengthen R sidebody to avoid L trunk lean, provide posture and body mechanics edu.  Continued work on balance strategies.     Baldemar Friday, PT, DPT 12/12/23 12:00 PM  [facility] at [facility] [address], [address] [place], [place] [address] Phone # [phone] Fax # [phone]

## 2023-12-11 NOTE — Telephone Encounter (Signed)
 I spoke with Diane with reading room and CT has been placed as STAT

## 2023-12-12 ENCOUNTER — Encounter: Payer: Self-pay | Admitting: Physical Therapy

## 2023-12-12 ENCOUNTER — Ambulatory Visit: Admitting: Physical Therapy

## 2023-12-12 DIAGNOSIS — M6281 Muscle weakness (generalized): Secondary | ICD-10-CM | POA: Diagnosis not present

## 2023-12-12 DIAGNOSIS — R2681 Unsteadiness on feet: Secondary | ICD-10-CM

## 2023-12-12 DIAGNOSIS — R293 Abnormal posture: Secondary | ICD-10-CM | POA: Diagnosis not present

## 2023-12-12 DIAGNOSIS — R2689 Other abnormalities of gait and mobility: Secondary | ICD-10-CM | POA: Diagnosis not present

## 2023-12-12 DIAGNOSIS — M5459 Other low back pain: Secondary | ICD-10-CM | POA: Diagnosis not present

## 2023-12-16 NOTE — Therapy (Signed)
 OUTPATIENT PHYSICAL THERAPY NEURO NOTE   Patient Name: [name] MRN: [id number] DOB:1941-07-19, 83 y.o., female Today's Date: 12/17/2023   PCP: Kristian Covey, MD  REFERRING PROVIDER: Kristian Covey, MD     END OF SESSION:  PT End of Session - 12/17/23 1541     Visit Number 14    Number of Visits 18    Date for PT Re-Evaluation 12/26/23    Authorization Type Medicare/Tricare    PT Start Time 1452    PT Stop Time 1534    PT Time Calculation (min) 42 min    Equipment Utilized During Treatment Gait belt    Activity Tolerance Patient tolerated treatment well    Behavior During Therapy WFL for tasks assessed/performed                        Past Medical History:  Diagnosis Date   Allergic rhinitis    Anemia    Anxiety    Bronchiectasis    Carpal tunnel syndrome 07/13/2015   Bilateral   CVA (cerebral infarction) 10/2007   Right thalamic    Diverticulosis of colon    Gait disorder    GERD (gastroesophageal reflux disease)    History of breast cancer    HTN (hypertension)    Hyperlipidemia    Idiopathic pulmonary fibrosis    Left knee DJD    Migraine    Osteoporosis    Peripheral neuropathy    PMR (polymyalgia rheumatica) (HCC)    Polyneuropathy in other diseases classified elsewhere (HCC) 02/17/2013   Previous back surgery 10/09/12   Renal insufficiency    Rheumatoid arthritis (HCC)    Seizure disorder (HCC)    Spondylosis, cervical, with myelopathy 08/31/2015   C3-4 myelopathy   Temporal arteritis (HCC)    Right eye blind, on steroids per [facility]/ Dr Anne Hahn   Type II or unspecified type diabetes mellitus without mention of complication, not stated as uncontrolled    2nd to steriods   Vitamin D deficiency    Past Surgical History:  Procedure Laterality Date   APPENDECTOMY  01/2021   CARPAL TUNNEL RELEASE Right    CATARACT EXTRACTION     OS - Summer 2010   CERVICAL FUSION  09/24/2008   4 rods and pins in place    CHOLECYSTECTOMY     COLONOSCOPY  12/15/2011   Procedure: COLONOSCOPY;  Surgeon: Charna Elizabeth, MD;  Location: [facility];  Service: Endoscopy;  Laterality: N/A;   LUMBAR FUSION  04/24/2010   W/Mechanical fixation - L2-5 [facility]   LUMBAR FUSION  09/25/2011   rods in hips (to stabilize).   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) 08/25/2019   Pain management 10/30/2017   Rheumatoid arthritis (HCC) 04/25/2017   S/P cervical spinal fusion 09/05/2015  Spondylosis, cervical, with myelopathy 08/31/2015   Carpal tunnel syndrome 07/13/2015   Torn ear lobe 06/07/2015   Obesity (BMI 30-39.9) 04/06/2014   Shortness of breath 07/14/2013   Bone cyst 07/14/2013   Polyneuropathy in other diseases classified elsewhere (HCC) 02/17/2013   Chronic back pain 12/09/2012   Peripheral edema 09/25/2012   Anemia due to chronic illness 09/01/2012   DDD (degenerative disc disease), lumbosacral 08/12/2012   Cervicalgia 07/11/2012   Disturbance of skin sensation 07/11/2012   Temporal arteritis (HCC) 04/01/2012   Lower GI bleed 12/24/2011   Rectal bleed 12/13/2011   Arthrodesis status 09/04/2011   Chronic renal insufficiency, stage II (mild) 04/04/2011   LUMBAR RADICULOPATHY, LEFT 04/18/2009   Essential hypertension 12/30/2007   SEIZURE DISORDER 12/30/2007   CEREBROVASCULAR ACCIDENT, HX OF 12/30/2007    Hyperlipidemia 10/08/2007   GERD 10/08/2007   DIVERTICULOSIS, COLON 10/08/2007   BRONCHIECTASIS 06/26/2007   PULMONARY FIBROSIS 05/17/2007   Osteoporosis 05/17/2007   BREAST CANCER, HX OF 05/17/2007   MASTECTOMY, LEFT, HX OF 05/17/2007    ONSET DATE: November 2024  REFERRING DIAG: R29.898 (ICD-10-CM) - Leg weakness, bilateral  THERAPY DIAG:  Other abnormalities of gait and mobility  Unsteadiness on feet  Abnormal posture  Muscle weakness (generalized)  Rationale for Evaluation and Treatment: Rehabilitation  SUBJECTIVE:                                                                                                                                                                                             SUBJECTIVE STATEMENT: "Doing okay I guess. Have a bit of a HA."   Pt accompanied by: self  PERTINENT HISTORY: Anemia, CTS, CVA 2009, GERD, breast CA s/p mastectomy, HTN, HLD, Idiopathic pulmonary fibrosis, migraine, osteoporosis, peripheral neuropathy, polymyalgia rheumatica, seizures, temporal arteritis, DMII, cervical fusion 2010, lumbar fusion 2011, 2013, L TKA  PAIN:  Are you having pain? Yes: NPRS scale: 5/10 Pain location: forehead, R side of neck Pain description: headache Aggravating factors: fall Relieving factors: meds  PRECAUTIONS: Fall and Other: osteoporosis ; low vision R eye  RED FLAGS: None   WEIGHT BEARING RESTRICTIONS: No  FALLS: Has patient fallen in last 6 months? Yes. Number of falls 2  LIVING ENVIRONMENT: Lives with: lives alone Lives in: House/apartment Stairs:  ramp entry; 1 story with 14 steps into finished attic (reports that she is afraid to go up there) Has following equipment at home: Quad cane small base, Walker - 4 wheeled, shower chair, Grab bars, and Ramped entry   PLOF: Independent and has a cleaning lady every 3 weeks; drives to [place] every weekend to assist in taking care  of her sister who has dementia    PATIENT GOALS:  improve leg strength   OBJECTIVE:      TODAY'S TREATMENT: 12/17/23 Activity Comments  STM and manual TPR to R neck  Soft tissue restriction and TTP over R UT, LS, cervical paraspinals. Pt rpeorts HA is "somewhat better"  R shoulder shrugs against very light manual resistance  Good tolerance   R sidebending 10x To tolerance; limited ROM   STS + overhead red medball reach 10x    in II bars: fwd/back stepping over 1/2 foam roll backwards walking  Significant L trunk lean with R LE stabilizing. Cueing to look outside as visual target for posture              Access Code: [email or web address] URL: [email or web address] Date: 12/10/2023 Prepared by: [facility] - Outpatient  Rehab - [facility]  Exercises - Sit to Stand with Arms Crossed  - 1 x daily - 5 x weekly - 2 sets - 10 reps - Seated March with Resistance  - 1 x daily - 5 x weekly - 2 sets - 10 reps - Seated Scapular Retraction  - 1 x daily - 5 x weekly - 2 sets - 10 reps - 3 sec hold - Standing Hip Abduction with Counter Support  - 1 x daily - 5 x weekly - 2 sets - 10 reps - Seated Hip Abduction with Resistance  - 1 x daily - 5 x weekly - 3 sets - 10 reps - Seated Knee Extension with Resistance  - 1 x daily - 5 x weekly - 3 sets - 10 reps - Alternating Step forward-Balance Reaction  - 1 x daily - 7 x weekly - 1-2 sets - 5 reps - Supine Hamstring Stretch with Strap  - 1 x daily - 5 x weekly - 2 sets - 30 sec hold - Bent Knee Fallouts  - 1 x daily - 5 x weekly - 3 sets - 10 reps - Seated Gluteal Sets  - 1 x daily - 5 x weekly - 3 sets - 10 reps - 3 sec hold - Side Stepping with Counter Support  - 1 x daily - 5 x weekly - 3 sets - 10 reps     -------------------------------------------------- Note: Objective measures below were completed at Evaluation unless otherwise noted.  DIAGNOSTIC FINDINGS: 09/02/23 L lower leg xray:  No acute fracture. 2. Postsurgical changes of total left knee arthroplasty  without evidence of hardware failure. 3. Moderate plantar calcaneal heel spur. 4. Possible wound within the proximal lateral calf soft tissues with patchy soft tissue sclerosis. Recommend clinical correlation. No cortical erosion is seen.  COGNITION: Overall cognitive status: Within functional limits for tasks assessed   SENSATION: Reports N/T in B feet  COORDINATION: Alternating pronation/supination: WNL B Alternating toe tap: WNL B Finger to nose: WNL B  POSTURE:  elevated shoulders, rounded posture, L lateral trunk flexion with R hip deviated further R ; fallen arch on L foot  LOWER EXTREMITY ROM:     Active  Right Eval Left Eval  Hip flexion    Hip extension    Hip abduction    Hip adduction    Hip internal rotation    Hip external rotation    Knee flexion    Knee extension    Ankle dorsiflexion 23 9  Ankle plantarflexion    Ankle inversion    Ankle eversion     (Blank rows = not tested)  LOWER EXTREMITY MMT:  MMT (in sitting) Right Eval Left Eval  Hip flexion 4+ 3+  Hip extension    Hip abduction 4 4  Hip adduction 4+ 4  Hip internal rotation    Hip external rotation    Knee flexion 4 4  Knee extension 4+ 4  Ankle dorsiflexion 4+ 4  Ankle plantarflexion 4 3+  Ankle inversion    Ankle eversion    (Blank rows = not tested)   GAIT: Gait pattern: R hip instability and hip drop,  heavy anterior and L lean on walker Assistive device utilized: Walker - 4 wheeled Level of assistance: Modified independence   FUNCTIONAL TESTS:     2.38 ft/sec                                                                                                                               TREATMENT DATE: 10/17/23     PATIENT EDUCATION: Education details: discussion on possible "life alert" in case of falls, prognosis, POC, HEP Person educated: Patient Education method: Explanation, Demonstration, Tactile cues, Verbal cues, and Handouts Education comprehension:  verbalized understanding and returned demonstration  HOME EXERCISE PROGRAM: Access Code: [email or web address] URL: [email or web address] Date: 10/17/2023 Prepared by: [facility] - Outpatient  Rehab - [facility]  Exercises - Sit to Stand with Arms Crossed  - 1 x daily - 5 x weekly - 2 sets - 10 reps - Seated March with Resistance  - 1 x daily - 5 x weekly - 2 sets - 10 reps - Seated Scapular Retraction  - 1 x daily - 5 x weekly - 2 sets - 10 reps - 3 sec hold - Standing Hip Abduction with Counter Support  - 1 x daily - 5 x weekly - 2 sets - 10 reps  GOALS: Goals reviewed with patient? Yes  SHORT TERM GOALS: Target date: 11/14/2023  Patient to be independent with initial HEP. Baseline: HEP initiated and reviewed Goal status: MET2/18/2025    LONG TERM GOALS: Target date: 12/26/2023  Patient to be independent with advanced HEP. Baseline: Not yet initiated ; pt reports compliance and denies questions 11/28/23 Goal status: IN PROGRESS 11/28/23  Patient to demonstrate B LE strength >/=4/5.  Baseline: See above; improving but not quite met 11/28/23  Goal status: IN PROGRESS 11/28/23   Patient to report understanding of posture and body mechanics education for management of neck and LBP.  Baseline: not yet initiated; not yet initiated 11/28/23 Goal status: IN PROGRESS 11/28/23  Patient to report putting plans to action in getting a life alert or other emergency communication system in case of falls.  Baseline: not yet in effect; discussed again 10/31/23; pt reported on 11/26/23 that family is looking into it for her  Goal status: IN PROGRESS 11/26/23   Patient to demonstrate gait speed of at least 2.63 ft/sec in order to improve access to community.   Baseline: 2.3 ft/sec; 15.33 sec with 4WW (2.14 ft/sec) 11/28/23 Goal status: IN  PROGRESS 11/28/23  Patient to demonstrate 5xSTS test in <15 sec without use of UEs and with good eccentric control in order to decrease risk of falls.  Baseline: 13.73  sec pulling up on walker and with decreased eccentric control; 16.73 sec without UEs, posterior LOB into chair  11/28/23  Goal status: IN PROGRESS 11/28/23   Patient to score at least 46/56 on Berg in order to decrease risk of falls.  Baseline: 27> 35/56 11/28/23  Goal status: IN PROGRESS 11/28/23   ASSESSMENT:  CLINICAL IMPRESSION: Patient arrived to session with report of a moderate HA and neck pain at start of session. This was addressed with MT. Proceeded with gentle stretching and strengthening for cervical musculature. Proceeded with dynamic balance activities with UE support for safety. No complaints at end of session.   OBJECTIVE IMPAIRMENTS: Abnormal gait, decreased activity tolerance, decreased balance, difficulty walking, decreased ROM, decreased strength, increased muscle spasms, impaired flexibility, postural dysfunction, and pain.   ACTIVITY LIMITATIONS: carrying, lifting, bending, sitting, standing, squatting, stairs, transfers, bathing, toileting, dressing, reach over head, hygiene/grooming, and locomotion level  PARTICIPATION LIMITATIONS: meal prep, cleaning, laundry, driving, shopping, community activity, and church  PERSONAL FACTORS: Age, Behavior pattern, Past/current experiences, Time since onset of injury/illness/exacerbation, and 3+ comorbidities: Anemia, CTS, CVA 2009, GERD, breast CA s/p mastectomy, HTN, HLD, Idiopathic pulmonary fibrosis, migraine, osteoporosis, peripheral neuropathy, polymyalgia rheumatica, seizures, temporal arteritis, DMII, cervical fusion 2010, lumbar fusion 2011, 2013, L TKA  are also affecting patient's functional outcome.   REHAB POTENTIAL: Good  CLINICAL DECISION MAKING: Evolving/moderate complexity  EVALUATION COMPLEXITY: Moderate  PLAN:  PT FREQUENCY: 2x/week  PT DURATION: 4 weeks  PLANNED INTERVENTIONS: 97164- PT Re-evaluation, 97110-Therapeutic exercises, 97530- Therapeutic activity, 97112- Neuromuscular re-education, 97535- Self Care,  97140- Manual therapy, [phone]- Gait training, 60454- Aquatic Therapy, 97014- Electrical stimulation (unattended), Patient/Family education, Balance training, Stair training, Taping, Dry Needling, Joint mobilization, Spinal mobilization, Vestibular training, Cryotherapy, and Moist heat  PLAN FOR NEXT SESSION: Review HEP update and review work on car transfers; strengthen R sidebody to avoid L trunk lean, provide posture and body mechanics edu.  Continued work on balance strategies.     Baldemar Friday, PT, DPT 12/17/23 3:44 PM  [facility] at [facility] [address] [place], [address] [place], [place] [address] Phone # [phone] Fax # [phone]

## 2023-12-17 ENCOUNTER — Encounter: Payer: Self-pay | Admitting: Physical Therapy

## 2023-12-17 ENCOUNTER — Ambulatory Visit: Admitting: Physical Therapy

## 2023-12-17 DIAGNOSIS — R2689 Other abnormalities of gait and mobility: Secondary | ICD-10-CM | POA: Diagnosis not present

## 2023-12-17 DIAGNOSIS — M6281 Muscle weakness (generalized): Secondary | ICD-10-CM | POA: Diagnosis not present

## 2023-12-17 DIAGNOSIS — R2681 Unsteadiness on feet: Secondary | ICD-10-CM

## 2023-12-17 DIAGNOSIS — M5459 Other low back pain: Secondary | ICD-10-CM | POA: Diagnosis not present

## 2023-12-17 DIAGNOSIS — R293 Abnormal posture: Secondary | ICD-10-CM

## 2023-12-18 NOTE — Therapy (Signed)
 OUTPATIENT PHYSICAL THERAPY NEURO NOTE   Patient Name: [name] MRN: [id number] DOB:1941/04/08, 83 y.o., female Today's Date: 12/19/2023   PCP: Kristian Covey, MD  REFERRING PROVIDER: Kristian Covey, MD     END OF SESSION:  PT End of Session - 12/19/23 1525     Visit Number 15    Number of Visits 18    Date for PT Re-Evaluation 12/26/23    Authorization Type Medicare/Tricare    PT Start Time 1447    PT Stop Time 1528    PT Time Calculation (min) 41 min    Equipment Utilized During Treatment --    Activity Tolerance Patient tolerated treatment well    Behavior During Therapy WFL for tasks assessed/performed                         Past Medical History:  Diagnosis Date   Allergic rhinitis    Anemia    Anxiety    Bronchiectasis    Carpal tunnel syndrome 07/13/2015   Bilateral   CVA (cerebral infarction) 10/2007   Right thalamic    Diverticulosis of colon    Gait disorder    GERD (gastroesophageal reflux disease)    History of breast cancer    HTN (hypertension)    Hyperlipidemia    Idiopathic pulmonary fibrosis    Left knee DJD    Migraine    Osteoporosis    Peripheral neuropathy    PMR (polymyalgia rheumatica) (HCC)    Polyneuropathy in other diseases classified elsewhere (HCC) 02/17/2013   Previous back surgery 10/09/12   Renal insufficiency    Rheumatoid arthritis (HCC)    Seizure disorder (HCC)    Spondylosis, cervical, with myelopathy 08/31/2015   C3-4 myelopathy   Temporal arteritis (HCC)    Right eye blind, on steroids per [facility]/ Dr Anne Hahn   Type II or unspecified type diabetes mellitus without mention of complication, not stated as uncontrolled    2nd to steriods   Vitamin D deficiency    Past Surgical History:  Procedure Laterality Date   APPENDECTOMY  01/2021   CARPAL TUNNEL RELEASE Right    CATARACT EXTRACTION     OS - Summer 2010   CERVICAL FUSION  09/24/2008   4 rods and pins in place   CHOLECYSTECTOMY      COLONOSCOPY  12/15/2011   Procedure: COLONOSCOPY;  Surgeon: Charna Elizabeth, MD;  Location: [facility];  Service: Endoscopy;  Laterality: N/A;   LUMBAR FUSION  04/24/2010   W/Mechanical fixation - L2-5 [facility]   LUMBAR FUSION  09/25/2011   rods in hips (to stabilize).   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) 08/25/2019   Pain management 10/30/2017   Rheumatoid arthritis (HCC) 04/25/2017   S/P cervical spinal fusion 09/05/2015  Spondylosis, cervical, with myelopathy 08/31/2015   Carpal tunnel syndrome 07/13/2015   Torn ear lobe 06/07/2015   Obesity (BMI 30-39.9) 04/06/2014   Shortness of breath 07/14/2013   Bone cyst 07/14/2013   Polyneuropathy in other diseases classified elsewhere (HCC) 02/17/2013   Chronic back pain 12/09/2012   Peripheral edema 09/25/2012   Anemia due to chronic illness 09/01/2012   DDD (degenerative disc disease), lumbosacral 08/12/2012   Cervicalgia 07/11/2012   Disturbance of skin sensation 07/11/2012   Temporal arteritis (HCC) 04/01/2012   Lower GI bleed 12/24/2011   Rectal bleed 12/13/2011   Arthrodesis status 09/04/2011   Chronic renal insufficiency, stage II (mild) 04/04/2011   LUMBAR RADICULOPATHY, LEFT 04/18/2009   Essential hypertension 12/30/2007   SEIZURE DISORDER 12/30/2007   CEREBROVASCULAR ACCIDENT, HX OF 12/30/2007   Hyperlipidemia 10/08/2007    GERD 10/08/2007   DIVERTICULOSIS, COLON 10/08/2007   BRONCHIECTASIS 06/26/2007   PULMONARY FIBROSIS 05/17/2007   Osteoporosis 05/17/2007   BREAST CANCER, HX OF 05/17/2007   MASTECTOMY, LEFT, HX OF 05/17/2007    ONSET DATE: November 2024  REFERRING DIAG: R29.898 (ICD-10-CM) - Leg weakness, bilateral  THERAPY DIAG:  Other abnormalities of gait and mobility  Unsteadiness on feet  Abnormal posture  Muscle weakness (generalized)  Rationale for Evaluation and Treatment: Rehabilitation  SUBJECTIVE:                                                                                                                                                                                             SUBJECTIVE STATEMENT: "Can't complain."   Pt accompanied by: self  PERTINENT HISTORY: Anemia, CTS, CVA 2009, GERD, breast CA s/p mastectomy, HTN, HLD, Idiopathic pulmonary fibrosis, migraine, osteoporosis, peripheral neuropathy, polymyalgia rheumatica, seizures, temporal arteritis, DMII, cervical fusion 2010, lumbar fusion 2011, 2013, L TKA  PAIN:  Are you having pain? Yes: NPRS scale: 0/10 Pain location: forehead, R side of neck Pain description: headache Aggravating factors: fall Relieving factors: meds  PRECAUTIONS: Fall and Other: osteoporosis ; low vision R eye  RED FLAGS: None   WEIGHT BEARING RESTRICTIONS: No  FALLS: Has patient fallen in last 6 months? Yes. Number of falls 2  LIVING ENVIRONMENT: Lives with: lives alone Lives in: House/apartment Stairs:  ramp entry; 1 story with 14 steps into finished attic (reports that she is afraid to go up there) Has following equipment at home: Quad cane small base, Walker - 4 wheeled, shower chair, Grab bars, and Ramped entry   PLOF: Independent and has a cleaning lady every 3 weeks; drives to [place] every weekend to assist in taking care of her sister who has dementia  PATIENT GOALS: improve leg strength   OBJECTIVE:     TODAY'S  TREATMENT: 12/19/23 Activity Comments  Nustep L3 x 6 min UEs/LEs  Pt tolerated  bridge Discontinued d/t c/o HS cramp  Straight leg bridge on red pball 2x10  Better tolerance . PT occasionally assisting to to prevent ball rolling L  Deadbug 20x  Heavy manual cueing to assist with coordination. Better success after cueing   sidelying hip ABD 3x10  Manual cueing for alignment;  difficulty and small amplutde   STS + overhead red medball reach 10x Cues to improve eccentric lower. Pt reports challenge               PATIENT EDUCATION: Education details: provided edu and handout on [facility]- highlighted AHOY, Better Balance, chair yoga  Person educated: Patient Education method: Explanation, Demonstration, Tactile cues, Verbal cues, and Handouts Education comprehension: verbalized understanding and returned demonstration    Access Code: [email or web address] URL: [email or web address] Date: 12/10/2023 Prepared by: [facility] - Outpatient  Rehab - [facility]  Exercises - Sit to Stand with Arms Crossed  - 1 x daily - 5 x weekly - 2 sets - 10 reps - Seated March with Resistance  - 1 x daily - 5 x weekly - 2 sets - 10 reps - Seated Scapular Retraction  - 1 x daily - 5 x weekly - 2 sets - 10 reps - 3 sec hold - Standing Hip Abduction with Counter Support  - 1 x daily - 5 x weekly - 2 sets - 10 reps - Seated Hip Abduction with Resistance  - 1 x daily - 5 x weekly - 3 sets - 10 reps - Seated Knee Extension with Resistance  - 1 x daily - 5 x weekly - 3 sets - 10 reps - Alternating Step forward-Balance Reaction  - 1 x daily - 7 x weekly - 1-2 sets - 5 reps - Supine Hamstring Stretch with Strap  - 1 x daily - 5 x weekly - 2 sets - 30 sec hold - Bent Knee Fallouts  - 1 x daily - 5 x weekly - 3 sets - 10 reps - Seated Gluteal Sets  - 1 x daily - 5 x weekly - 3 sets - 10 reps - 3 sec hold - Side Stepping with Counter Support  - 1 x daily - 5 x weekly - 3 sets - 10  reps     -------------------------------------------------- Note: Objective measures below were completed at Evaluation unless otherwise noted.  DIAGNOSTIC FINDINGS: 09/02/23 L lower leg xray:  No acute fracture. 2. Postsurgical changes of total left knee arthroplasty without evidence of hardware failure. 3. Moderate plantar calcaneal heel spur. 4. Possible wound within the proximal lateral calf soft tissues with patchy soft tissue sclerosis. Recommend clinical correlation. No cortical erosion is seen.  COGNITION: Overall cognitive status: Within functional limits for tasks assessed   SENSATION: Reports N/T in B feet  COORDINATION: Alternating pronation/supination: WNL B Alternating toe tap: WNL B Finger to nose: WNL B  POSTURE:  elevated shoulders, rounded posture, L lateral trunk flexion with R hip deviated further R ; fallen arch on L foot  LOWER EXTREMITY ROM:     Active  Right Eval Left Eval  Hip flexion    Hip extension    Hip abduction    Hip adduction    Hip internal rotation    Hip external rotation    Knee flexion    Knee extension  Ankle dorsiflexion 23 9  Ankle plantarflexion    Ankle inversion    Ankle eversion     (Blank rows = not tested)  LOWER EXTREMITY MMT:    MMT (in sitting) Right Eval Left Eval  Hip flexion 4+ 3+  Hip extension    Hip abduction 4 4  Hip adduction 4+ 4  Hip internal rotation    Hip external rotation    Knee flexion 4 4  Knee extension 4+ 4  Ankle dorsiflexion 4+ 4  Ankle plantarflexion 4 3+  Ankle inversion    Ankle eversion    (Blank rows = not tested)   GAIT: Gait pattern: R hip instability and hip drop,  heavy anterior and L lean on walker Assistive device utilized: Walker - 4 wheeled Level of assistance: Modified independence   FUNCTIONAL TESTS:     2.38 ft/sec                                                                                                                                TREATMENT DATE: 10/17/23     PATIENT EDUCATION: Education details: discussion on possible "life alert" in case of falls, prognosis, POC, HEP Person educated: Patient Education method: Explanation, Demonstration, Tactile cues, Verbal cues, and Handouts Education comprehension: verbalized understanding and returned demonstration  HOME EXERCISE PROGRAM: Access Code: [email or web address] URL: [email or web address] Date: 10/17/2023 Prepared by: [facility] - Outpatient  Rehab - [facility]  Exercises - Sit to Stand with Arms Crossed  - 1 x daily - 5 x weekly - 2 sets - 10 reps - Seated March with Resistance  - 1 x daily - 5 x weekly - 2 sets - 10 reps - Seated Scapular Retraction  - 1 x daily - 5 x weekly - 2 sets - 10 reps - 3 sec hold - Standing Hip Abduction with Counter Support  - 1 x daily - 5 x weekly - 2 sets - 10 reps  GOALS: Goals reviewed with patient? Yes  SHORT TERM GOALS: Target date: 11/14/2023  Patient to be independent with initial HEP. Baseline: HEP initiated and reviewed Goal status: MET2/18/2025    LONG TERM GOALS: Target date: 12/26/2023  Patient to be independent with advanced HEP. Baseline: Not yet initiated ; pt reports compliance and denies questions 11/28/23 Goal status: IN PROGRESS 11/28/23  Patient to demonstrate B LE strength >/=4/5.  Baseline: See above; improving but not quite met 11/28/23  Goal status: IN PROGRESS 11/28/23   Patient to report understanding of posture and body mechanics education for management of neck and LBP.  Baseline: not yet initiated; not yet initiated 11/28/23 Goal status: IN PROGRESS 11/28/23  Patient to report putting plans to action in getting a life alert or other emergency communication system in case of falls.  Baseline: not yet in effect; discussed again 10/31/23; pt reported on 11/26/23 that family is looking into it for her  Goal status: IN PROGRESS 11/26/23  Patient to demonstrate gait speed of at least 2.63 ft/sec in  order to improve access to community.   Baseline: 2.3 ft/sec; 15.33 sec with 4WW (2.14 ft/sec) 11/28/23 Goal status: IN PROGRESS 11/28/23  Patient to demonstrate 5xSTS test in <15 sec without use of UEs and with good eccentric control in order to decrease risk of falls.  Baseline: 13.73 sec pulling up on walker and with decreased eccentric control; 16.73 sec without UEs, posterior LOB into chair  11/28/23  Goal status: IN PROGRESS 11/28/23   Patient to score at least 46/56 on Berg in order to decrease risk of falls.  Baseline: 27> 35/56 11/28/23  Goal status: IN PROGRESS 11/28/23   ASSESSMENT:  CLINICAL IMPRESSION: Patient arrived to session without complaints. Provided information on senior services/activities to improve community involvement as she reports getting lonely living alone. Pointed out specific exercise classes that would be appropriate for patient. Worked on core and hip strengthening with modifications for max comfort. Patient reported most challenge with lateral hip strengthening and weighted STS. No complaints at end of session.   OBJECTIVE IMPAIRMENTS: Abnormal gait, decreased activity tolerance, decreased balance, difficulty walking, decreased ROM, decreased strength, increased muscle spasms, impaired flexibility, postural dysfunction, and pain.   ACTIVITY LIMITATIONS: carrying, lifting, bending, sitting, standing, squatting, stairs, transfers, bathing, toileting, dressing, reach over head, hygiene/grooming, and locomotion level  PARTICIPATION LIMITATIONS: meal prep, cleaning, laundry, driving, shopping, community activity, and church  PERSONAL FACTORS: Age, Behavior pattern, Past/current experiences, Time since onset of injury/illness/exacerbation, and 3+ comorbidities: Anemia, CTS, CVA 2009, GERD, breast CA s/p mastectomy, HTN, HLD, Idiopathic pulmonary fibrosis, migraine, osteoporosis, peripheral neuropathy, polymyalgia rheumatica, seizures, temporal arteritis, DMII, cervical fusion  2010, lumbar fusion 2011, 2013, L TKA  are also affecting patient's functional outcome.   REHAB POTENTIAL: Good  CLINICAL DECISION MAKING: Evolving/moderate complexity  EVALUATION COMPLEXITY: Moderate  PLAN:  PT FREQUENCY: 2x/week  PT DURATION: 4 weeks  PLANNED INTERVENTIONS: 97164- PT Re-evaluation, 97110-Therapeutic exercises, 97530- Therapeutic activity, 97112- Neuromuscular re-education, 97535- Self Care, 81191- Manual therapy, [phone]- Gait training, [phone]- Aquatic Therapy, 97014- Electrical stimulation (unattended), Patient/Family education, Balance training, Stair training, Taping, Dry Needling, Joint mobilization, Spinal mobilization, Vestibular training, Cryotherapy, and Moist heat  PLAN FOR NEXT SESSION: Review HEP update and review work on car transfers; strengthen R sidebody to avoid L trunk lean, provide posture and body mechanics edu.  Continued work on balance strategies.     Baldemar Friday, PT, DPT 12/19/23 3:29 PM  [facility] at [facility] [address] [place], [address] [place], [place] [address] Phone # [phone] Fax # [phone]

## 2023-12-19 ENCOUNTER — Encounter: Payer: Self-pay | Admitting: Physical Therapy

## 2023-12-19 ENCOUNTER — Ambulatory Visit: Admitting: Physical Therapy

## 2023-12-19 DIAGNOSIS — M6281 Muscle weakness (generalized): Secondary | ICD-10-CM | POA: Diagnosis not present

## 2023-12-19 DIAGNOSIS — R2689 Other abnormalities of gait and mobility: Secondary | ICD-10-CM | POA: Diagnosis not present

## 2023-12-19 DIAGNOSIS — R293 Abnormal posture: Secondary | ICD-10-CM | POA: Diagnosis not present

## 2023-12-19 DIAGNOSIS — R2681 Unsteadiness on feet: Secondary | ICD-10-CM

## 2023-12-19 DIAGNOSIS — M5459 Other low back pain: Secondary | ICD-10-CM | POA: Diagnosis not present

## 2023-12-20 ENCOUNTER — Ambulatory Visit: Payer: Self-pay

## 2023-12-20 NOTE — Telephone Encounter (Signed)
 Appt scheduled for 12/27/2023

## 2023-12-20 NOTE — Telephone Encounter (Signed)
 Copied from CRM [id number]. Topic: Clinical - Red Word Triage >> Dec 20, 2023  8:10 AM Gurney Maxin H wrote: [facility] that prompted transfer to Nurse Triage: Patient states she's having some confusion issues and asking if she takes insulin or has to check it and doesn't know just really confused.   Chief Complaint: Confusion Symptoms: confusion Frequency: this morning upon waking--last known well per patient was 12 last night (8.5 hours ago) Pertinent Negatives: Patient denies symptoms: headache, dizziness, vision loss, double vision, changes in speech, unsteady on your feet, chest pain, difficulty breathing Disposition: [] ED /[] Urgent Care (no appt availability in office) / [] Appointment(In office/virtual)/ []  [facility]/ [] Home Care/ [x] Refused Recommended Disposition /[] [facility]/ []  Follow-up with PCP Additional Notes: Patient called and advised that she was confused about whether she should "have her insulin checked" Patient states that she lives alone and she woke up this morning thinking that she had to have something done or get her insulin checked.  This RN checked patient's chart and did not see any recent mention of Insulin or history of diabetes that I could readily find. It is advised to the patient that if she is having issues with confusion and this RN is unable to determine what her baseline is--it is recommended that she goes to the Emergency Room to be checked out. Patient states that she doesn't want that and that she lives by herself.  This RN advised her that I could call an ambulance for her. Patient said she did not want an ambulance at this time either. Patient denies symptoms: headache, dizziness, vision loss, double vision, changes in speech, unsteady on your feet, chest pain, difficulty breathing. She denies having any other symptoms besides just feeling like she is forgetting needing to have something checked.  This RN called [facility] and was transferred to  clinical staff/nursing staff by Nelva Bush. Clinical staff advised that the patient has history of confusion and asked if she had any other symptoms.  Patient had denied any other symptoms other than just feeling like she is forgetting something this morning or that she needed to get her insulin checked. Patient states that she went to bed at 12 last night and woke up this morning feeling confused. Clinical staff (Mykal?) advised that he was familiar with the patient but was unable to speak with her at this time due to being busy at the office with other patients who are present at this time.  He advised that he would send this message to Dr Caryl Never to see what his advice was and also to try and see if the patient would go to the ER or Urgent Care at this time. This RN advised the patient of this and also attempted once again to get the patient to go to the Emergency Room by ambulance to be further evaluated.  Patient declined again and was asked some baseline alert/orientation questions: She was able to answer what year it was, who the president was, and her current location including address. She asked if there was anything in her chart that says she has this problem or that she needed her insulin checked.  This RN advised her that on my end I didn't see anything about any insulin at this time and that I felt strongly that she should go to the Emergency Room to be checked out.  Patient declined politely and said she would see if she felt better throughout the day.  Patient is advised that if anything  changes or worsens to call 911 immediately to be taken to the Emergency Room for further evaluation.  Patient verbalized understanding.    Reason for Disposition  Patient sounds very sick or weak to the triager    Patient states that she is confused and that she "feels like she is supposed to have her insulin checked". Nobody is present with the patient due to her living alone so this RN is unable to determine  her level of confusion as compared to her baseline.  Patient has a history of confusion.  Answer Assessment - Initial Assessment Questions 1. SYMPTOM: "What is the main symptom you are concerned about?" (e.g., weakness, numbness)     confusion 2. ONSET: "When did this start?" (minutes, hours, days; while sleeping)     This morning 3. LAST NORMAL: "When was the last time you (the patient) were normal (no symptoms)?"     Last night possibly around midnight 4. PATTERN "Does this come and go, or has it been constant since it started?"  "Is it present now?"     unsure 5. CARDIAC SYMPTOMS: "Have you had any of the following symptoms: chest pain, difficulty breathing, palpitations?"     Np 6. NEUROLOGIC SYMPTOMS: "Have you had any of the following symptoms: headache, dizziness, vision loss, double vision, changes in speech, unsteady on your feet?"     No 7. OTHER SYMPTOMS: "Do you have any other symptoms?"     Pt states that she can't remember what she is supposed to do this morning but she is not sure  Protocols used: Neurologic Deficit-A-AH

## 2023-12-24 ENCOUNTER — Ambulatory Visit: Admitting: Physical Therapy

## 2023-12-26 ENCOUNTER — Encounter: Payer: Self-pay | Admitting: Physical Therapy

## 2023-12-26 ENCOUNTER — Ambulatory Visit: Attending: Family Medicine | Admitting: Physical Therapy

## 2023-12-26 DIAGNOSIS — R2689 Other abnormalities of gait and mobility: Secondary | ICD-10-CM | POA: Diagnosis not present

## 2023-12-26 DIAGNOSIS — M6281 Muscle weakness (generalized): Secondary | ICD-10-CM | POA: Insufficient documentation

## 2023-12-26 DIAGNOSIS — R2681 Unsteadiness on feet: Secondary | ICD-10-CM | POA: Diagnosis not present

## 2023-12-26 DIAGNOSIS — R293 Abnormal posture: Secondary | ICD-10-CM | POA: Insufficient documentation

## 2023-12-26 NOTE — Therapy (Signed)
 OUTPATIENT PHYSICAL THERAPY NEURO NOTE/DISCHARGE SUMMARY   Patient Name: [name] MRN: [id number] DOB:1940-11-03, 83 y.o., female Today's Date: 12/26/2023   PCP: Kristian Covey, MD  REFERRING PROVIDER: Kristian Covey, MD   PHYSICAL THERAPY DISCHARGE SUMMARY  Visits from Start of Care: 16  Current functional level related to goals / functional outcomes: See below-pt has met 4 of 7 goals.   Remaining deficits: Balance, strength, posture   Education / Equipment: HEP, fall prevention, community options for fitness   Patient agrees to discharge. Patient goals were partially met. Patient is being discharged due to being pleased with the current functional level.  END OF SESSION:  PT End of Session - 12/26/23 1150     Visit Number 16    Number of Visits 18    Date for PT Re-Evaluation 12/26/23    Authorization Type Medicare/Tricare    PT Start Time 1149    PT Stop Time 1222    PT Time Calculation (min) 33 min    Activity Tolerance Patient tolerated treatment well    Behavior During Therapy WFL for tasks assessed/performed                          Past Medical History:  Diagnosis Date   Allergic rhinitis    Anemia    Anxiety    Bronchiectasis    Carpal tunnel syndrome 07/13/2015   Bilateral   CVA (cerebral infarction) 10/2007   Right thalamic    Diverticulosis of colon    Gait disorder    GERD (gastroesophageal reflux disease)    History of breast cancer    HTN (hypertension)    Hyperlipidemia    Idiopathic pulmonary fibrosis    Left knee DJD    Migraine    Osteoporosis    Peripheral neuropathy    PMR (polymyalgia rheumatica) (HCC)    Polyneuropathy in other diseases classified elsewhere (HCC) 02/17/2013   Previous back surgery 10/09/12   Renal insufficiency    Rheumatoid arthritis (HCC)    Seizure disorder (HCC)    Spondylosis, cervical, with myelopathy 08/31/2015   C3-4 myelopathy   Temporal arteritis (HCC)    Right eye  blind, on steroids per [facility]/ Dr Anne Hahn   Type II or unspecified type diabetes mellitus without mention of complication, not stated as uncontrolled    2nd to steriods   Vitamin D deficiency    Past Surgical History:  Procedure Laterality Date   APPENDECTOMY  01/2021   CARPAL TUNNEL RELEASE Right    CATARACT EXTRACTION     OS - Summer 2010   CERVICAL FUSION  09/24/2008   4 rods and pins in place   CHOLECYSTECTOMY     COLONOSCOPY  12/15/2011   Procedure: COLONOSCOPY;  Surgeon: Charna Elizabeth, MD;  Location: [facility];  Service: Endoscopy;  Laterality: N/A;   LUMBAR FUSION  04/24/2010   W/Mechanical fixation - L2-5 [facility]   LUMBAR FUSION  09/25/2011   rods in hips (to stabilize).   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) 08/25/2019   Pain management 10/30/2017   Rheumatoid arthritis (HCC) 04/25/2017   S/P cervical spinal fusion 09/05/2015   Spondylosis, cervical, with myelopathy 08/31/2015   Carpal tunnel syndrome 07/13/2015   Torn ear lobe 06/07/2015   Obesity (BMI 30-39.9) 04/06/2014   Shortness of breath 07/14/2013   Bone cyst 07/14/2013   Polyneuropathy in other diseases classified elsewhere (HCC) 02/17/2013   Chronic back pain 12/09/2012   Peripheral edema 09/25/2012   Anemia due to chronic illness 09/01/2012   DDD (degenerative disc disease), lumbosacral 08/12/2012    Cervicalgia 07/11/2012   Disturbance of skin sensation 07/11/2012   Temporal arteritis (HCC) 04/01/2012   Lower GI bleed 12/24/2011   Rectal bleed 12/13/2011   Arthrodesis status 09/04/2011   Chronic renal insufficiency, stage II (mild) 04/04/2011   LUMBAR RADICULOPATHY, LEFT 04/18/2009   Essential hypertension 12/30/2007   SEIZURE DISORDER 12/30/2007   CEREBROVASCULAR ACCIDENT, HX OF 12/30/2007   Hyperlipidemia 10/08/2007   GERD 10/08/2007   DIVERTICULOSIS, COLON 10/08/2007   BRONCHIECTASIS 06/26/2007   PULMONARY FIBROSIS 05/17/2007   Osteoporosis 05/17/2007   BREAST CANCER, HX OF 05/17/2007   MASTECTOMY, LEFT, HX OF 05/17/2007    ONSET DATE: November 2024  REFERRING DIAG: R29.898 (ICD-10-CM) - Leg weakness, bilateral  THERAPY DIAG:  Other abnormalities of gait and mobility  Unsteadiness on feet  Abnormal posture  Muscle weakness (generalized)  Rationale for Evaluation and Treatment: Rehabilitation  SUBJECTIVE:                                                                                                                                                                                             SUBJECTIVE STATEMENT: Woke up earlier this week and though I needed insulin.  Was just confused/concerned.  I thought I had an appointment, but missed it due to stomach issues.   Pt accompanied by: self  PERTINENT HISTORY: Anemia, CTS, CVA 2009, GERD, breast CA s/p mastectomy, HTN, HLD, Idiopathic pulmonary fibrosis, migraine, osteoporosis, peripheral neuropathy, polymyalgia rheumatica, seizures, temporal arteritis, DMII, cervical fusion 2010, lumbar fusion 2011, 2013, L TKA  PAIN:  Are you having pain? Yes: NPRS scale: 0/10 Pain location: forehead, R side of neck Pain description: headache Aggravating factors: fall Relieving factors: meds  PRECAUTIONS: Fall and Other: osteoporosis ; low vision R eye  RED FLAGS: None   WEIGHT BEARING RESTRICTIONS: No  FALLS: Has  patient fallen in last 6 months? Yes. Number of falls 2  LIVING ENVIRONMENT: Lives with: lives alone Lives in: House/apartment Stairs:  ramp entry; 1 story with 14 steps into finished attic (reports that she is afraid to go up there) Has following equipment at home: Quad cane small base, Walker - 4 wheeled, shower chair, Grab bars, and Ramped entry   PLOF: Independent and has a cleaning lady every 3 weeks; drives to [place] every weekend to assist in taking care of her sister who has dementia    PATIENT GOALS: improve leg strength   OBJECTIVE:     TODAY'S TREATMENT: 12/26/23 Activity Comments  MMT-see below chart   24M walk:  13.28 sec = 2.47 ft/sec   FTSTS:  16.72 sec without UE support   Berg Balance test:  38/56 Improved from 35/56          [facility] PT Assessment - 12/26/23 1205       Standardized Balance Assessment   Standardized Balance Assessment Berg Balance Test      Berg Balance Test   Sit to Stand Able to stand without using hands and stabilize independently    Standing Unsupported Able to stand safely 2 minutes    Sitting with Back Unsupported but Feet Supported on Floor or Stool Able to sit safely and securely 2 minutes    Stand to Sit Sits safely with minimal use of hands    Transfers Able to transfer safely, minor use of hands    Standing Unsupported with Eyes Closed Able to stand 10 seconds with supervision    Standing Unsupported with Feet Together Able to place feet together independently and stand for 1 minute with supervision    From Standing, Reach Forward with Outstretched Arm Can reach forward >12 cm safely (5")    From Standing Position, Pick up Object from Floor Able to pick up shoe, needs supervision    From Standing Position, Turn to Look Behind Over each Shoulder Turn sideways only but maintains balance    Turn 360 Degrees Needs close supervision or verbal cueing    Standing Unsupported, Alternately Place Feet on Step/Stool Needs assistance to keep from  falling or unable to try    Standing Unsupported, One Foot in Front Able to take small step independently and hold 30 seconds    Standing on One Leg Tries to lift leg/unable to hold 3 seconds but remains standing independently    Total Score 38                PATIENT EDUCATION: Education details: Progress towards goals, POC;plans for d/c this visit Person educated: Patient Education method: Explanation, Demonstration, Tactile cues, Verbal cues, and Handouts Education comprehension: verbalized understanding and returned demonstration    Access Code: [email or web address] URL: [email or web address] Date: 12/10/2023 Prepared by: [facility] - Outpatient  Rehab - [facility]  Exercises - Sit to Stand with Arms Crossed  - 1 x daily - 5 x weekly - 2 sets - 10 reps - Seated March with Resistance  - 1 x daily - 5 x weekly - 2 sets - 10 reps - Seated Scapular Retraction  - 1 x daily - 5 x weekly - 2 sets - 10 reps - 3 sec hold - Standing Hip Abduction with Counter Support  - 1 x daily - 5 x weekly - 2 sets - 10 reps - Seated Hip Abduction with Resistance  - 1 x daily - 5 x weekly - 3 sets - 10 reps -  Seated Knee Extension with Resistance  - 1 x daily - 5 x weekly - 3 sets - 10 reps - Alternating Step forward-Balance Reaction  - 1 x daily - 7 x weekly - 1-2 sets - 5 reps - Supine Hamstring Stretch with Strap  - 1 x daily - 5 x weekly - 2 sets - 30 sec hold - Bent Knee Fallouts  - 1 x daily - 5 x weekly - 3 sets - 10 reps - Seated Gluteal Sets  - 1 x daily - 5 x weekly - 3 sets - 10 reps - 3 sec hold - Side Stepping with Counter Support  - 1 x daily - 5 x weekly - 3 sets - 10 reps     -------------------------------------------------- Note: Objective measures below were completed at Evaluation unless otherwise noted.  DIAGNOSTIC FINDINGS: 09/02/23 L lower leg xray:  No acute fracture. 2. Postsurgical changes of total left knee arthroplasty without evidence of hardware  failure. 3. Moderate plantar calcaneal heel spur. 4. Possible wound within the proximal lateral calf soft tissues with patchy soft tissue sclerosis. Recommend clinical correlation. No cortical erosion is seen.  COGNITION: Overall cognitive status: Within functional limits for tasks assessed   SENSATION: Reports N/T in B feet  COORDINATION: Alternating pronation/supination: WNL B Alternating toe tap: WNL B Finger to nose: WNL B  POSTURE:  elevated shoulders, rounded posture, L lateral trunk flexion with R hip deviated further R ; fallen arch on L foot  LOWER EXTREMITY ROM:     Active  Right Eval Left Eval  Hip flexion    Hip extension    Hip abduction    Hip adduction    Hip internal rotation    Hip external rotation    Knee flexion    Knee extension    Ankle dorsiflexion 23 9  Ankle plantarflexion    Ankle inversion    Ankle eversion     (Blank rows = not tested)  LOWER EXTREMITY MMT:    MMT (in sitting) Right Eval Right 12/26/2023 Left Eval Left 12/26/2023  Hip flexion 4+ 4+ 3+ 4  Hip extension      Hip abduction 4 4 4 4   Hip adduction 4+ 4+ 4 4+  Hip internal rotation      Hip external rotation      Knee flexion 4 4+ 4 4+  Knee extension 4+ 4+ 4 4+  Ankle dorsiflexion 4+ 4+ 4 3+  Ankle plantarflexion 4 4 3+ 4  Ankle inversion      Ankle eversion      (Blank rows = not tested)   GAIT: Gait pattern: R hip instability and hip drop,  heavy anterior and L lean on walker Assistive device utilized: Walker - 4 wheeled Level of assistance: Modified independence   FUNCTIONAL TESTS:   [facility] PT Assessment - 12/26/23 1205       Standardized Balance Assessment   Standardized Balance Assessment Berg Balance Test      Berg Balance Test   Sit to Stand Able to stand without using hands and stabilize independently    Standing Unsupported Able to stand safely 2 minutes    Sitting with Back Unsupported but Feet Supported on Floor or Stool Able to sit safely and  securely 2 minutes    Stand to Sit Sits safely with minimal use of hands    Transfers Able to transfer safely, minor use of hands    Standing Unsupported with Eyes Closed Able to stand 10  seconds with supervision    Standing Unsupported with Feet Together Able to place feet together independently and stand for 1 minute with supervision    From Standing, Reach Forward with Outstretched Arm Can reach forward >12 cm safely (5")    From Standing Position, Pick up Object from Floor Able to pick up shoe, needs supervision    From Standing Position, Turn to Look Behind Over each Shoulder Turn sideways only but maintains balance    Turn 360 Degrees Needs close supervision or verbal cueing    Standing Unsupported, Alternately Place Feet on Step/Stool Needs assistance to keep from falling or unable to try    Standing Unsupported, One Foot in Front Able to take small step independently and hold 30 seconds    Standing on One Leg Tries to lift leg/unable to hold 3 seconds but remains standing independently    Total Score 38              2.38 ft/sec                                                                                                                               TREATMENT DATE: 10/17/23     PATIENT EDUCATION: Education details: discussion on possible "life alert" in case of falls, prognosis, POC, HEP Person educated: Patient Education method: Explanation, Demonstration, Tactile cues, Verbal cues, and Handouts Education comprehension: verbalized understanding and returned demonstration  HOME EXERCISE PROGRAM: Access Code: [email or web address] URL: [email or web address] Date: 10/17/2023 Prepared by: [facility] - Outpatient  Rehab - [facility]  Exercises - Sit to Stand with Arms Crossed  - 1 x daily - 5 x weekly - 2 sets - 10 reps - Seated March with Resistance  - 1 x daily - 5 x weekly - 2 sets - 10 reps - Seated Scapular Retraction  - 1 x daily - 5 x weekly - 2 sets - 10  reps - 3 sec hold - Standing Hip Abduction with Counter Support  - 1 x daily - 5 x weekly - 2 sets - 10 reps  GOALS: Goals reviewed with patient? Yes  SHORT TERM GOALS: Target date: 11/14/2023  Patient to be independent with initial HEP. Baseline: HEP initiated and reviewed Goal status: MET2/18/2025    LONG TERM GOALS: Target date: 12/26/2023  Patient to be independent with advanced HEP. Baseline:  pt reports compliance and denies questions 12/26/23 Goal status: MET per report 12/26/2023 Patient to demonstrate B LE strength >/=4/5.  Baseline: See above 12/26/2023 Goal status: MET 12/26/23   Patient to report understanding of posture and body mechanics education for management of neck and LBP.  Baseline: Pt able to verbalize some options for good body mechanics, use of rollator for safety Goal status: MET 12/26/23  Patient to report putting plans to action in getting a life alert or other emergency communication system in case of falls.  Baseline: getting information on Life Alert and plans to order  12/26/2023 Goal status: MET 12/26/23   Patient to demonstrate gait speed of at least 2.63 ft/sec in order to improve access to community.   Baseline: 2.3 ft/sec; 2.47 ft/sec 12/26/2023 Goal status: NOT MET 12/26/23  Patient to demonstrate 5xSTS test in <15 sec without use of UEs and with good eccentric control in order to decrease risk of falls.  Baseline: 13.73 sec pulling up on walker and with decreased eccentric control; 16.73 sec without UEs, posterior LOB into chair  11/28/23; 16.72 sec without UE support and improved control, 12/26/2023 Goal status: NOT MET 12/26/23   Patient to score at least 46/56 on Berg in order to decrease risk of falls.  Baseline: 27> 35/56 11/28/23 >38/56 12/26/2023 Goal status: PARTIALLY MET,  12/26/23   ASSESSMENT:  CLINICAL IMPRESSION: Pt presents today with reports of overall feeling better balanced and stronger since beginning therapy. Skilled PT session focused on assessing  LTGs.  She has met 4 of 7 LTGs.  Since eval, she has improved Berg, FTSTS and gait velocity scores, just not to goal level.  Reviewed education provided, especially, given continued fall risk, that she will benefit from continued use of rollator for stability, support with gait. She feels confident to continue current HEP and she plans to reach out to [facility] for community exercise options.  She is appropriate for d/c from PT this visit.  OBJECTIVE IMPAIRMENTS: Abnormal gait, decreased activity tolerance, decreased balance, difficulty walking, decreased ROM, decreased strength, increased muscle spasms, impaired flexibility, postural dysfunction, and pain.   ACTIVITY LIMITATIONS: carrying, lifting, bending, sitting, standing, squatting, stairs, transfers, bathing, toileting, dressing, reach over head, hygiene/grooming, and locomotion level  PARTICIPATION LIMITATIONS: meal prep, cleaning, laundry, driving, shopping, community activity, and church  PERSONAL FACTORS: Age, Behavior pattern, Past/current experiences, Time since onset of injury/illness/exacerbation, and 3+ comorbidities: Anemia, CTS, CVA 2009, GERD, breast CA s/p mastectomy, HTN, HLD, Idiopathic pulmonary fibrosis, migraine, osteoporosis, peripheral neuropathy, polymyalgia rheumatica, seizures, temporal arteritis, DMII, cervical fusion 2010, lumbar fusion 2011, 2013, L TKA  are also affecting patient's functional outcome.   REHAB POTENTIAL: Good  CLINICAL DECISION MAKING: Evolving/moderate complexity  EVALUATION COMPLEXITY: Moderate  PLAN:  PT FREQUENCY: 2x/week  PT DURATION: 4 weeks  PLANNED INTERVENTIONS: 97164- PT Re-evaluation, 97110-Therapeutic exercises, 97530- Therapeutic activity, 97112- Neuromuscular re-education, 97535- Self Care, 16109- Manual therapy, [id number]- Gait training, [phone]- Aquatic Therapy, 97014- Electrical stimulation (unattended), Patient/Family education, Balance training, Stair training, Taping, Dry  Needling, Joint mobilization, Spinal mobilization, Vestibular training, Cryotherapy, and Moist heat  PLAN FOR NEXT SESSION: Discharge PT this visit.     Lonia Blood, PT 12/26/23 1:26 PM Phone: [phone] Fax: [phone]   [facility] at [facility] [address] [place], [address] [place], [place] [address] Phone # [phone] Fax # [phone]

## 2023-12-27 ENCOUNTER — Ambulatory Visit (INDEPENDENT_AMBULATORY_CARE_PROVIDER_SITE_OTHER): Admitting: Family Medicine

## 2023-12-27 ENCOUNTER — Encounter: Payer: Self-pay | Admitting: Family Medicine

## 2023-12-27 VITALS — BP 158/68 | HR 57 | Temp 98.0°F | Wt 123.8 lb

## 2023-12-27 DIAGNOSIS — R410 Disorientation, unspecified: Secondary | ICD-10-CM

## 2023-12-27 DIAGNOSIS — I10 Essential (primary) hypertension: Secondary | ICD-10-CM | POA: Diagnosis not present

## 2023-12-27 DIAGNOSIS — R296 Repeated falls: Secondary | ICD-10-CM | POA: Diagnosis not present

## 2023-12-27 DIAGNOSIS — G3184 Mild cognitive impairment, so stated: Secondary | ICD-10-CM

## 2023-12-27 NOTE — Patient Instructions (Signed)
 I will be setting up Neurology appointment.  Monitor home BP and be in touch if consistently > 140 systolic (top number).

## 2023-12-27 NOTE — Progress Notes (Signed)
 Established Patient Office Visit  Subjective   Patient ID: [name], female    DOB: 1941/09/05  Age: 83 y.o. MRN: [id number]  Chief Complaint  Patient presents with   Altered Mental Status    HPI   [name] is seen for medical follow-up.  We had received a note earlier this week from triage nurse that patient had called and asked whether she should "have her insulin checked".  She states she woke up and seemed very confused.  At first, she could not recall who her physicians were.  She was advised by RN to go to the ER but she declined.  She denied any headaches, focal weakness, chest pains, fever, dysuria, or slurred speech.  She seems very clear today in recollection regarding events from the 28th.  She recalls being confused and felt like she had just come "out of a dream" and believed that she had diabetes.  After a couple hours she states she felt much clearer in her thinking.  She has had a lot of falls recently but states she has had none since her last visit here.  She is currently getting physical therapy for balance issues and feels like that is helping tremendously.  She feels much more stable.  She is very concerned about her cognitive issues.  She has a sister who is 59 who lives in [place] who has very advanced dementia.  Patient has concerns regarding her risk.  She still lives independently and manages all of her finances and other affairs currently.  She does feel like she is having some difficulties at times with things like word recall.  She has remote history of temporal arteritis.  She has had prior history of reported TIA along with hypertension, osteoarthritis involving multiple joints and chronic back pain.  Recent CT head following one of her many falls during the past year and this showed no acute intracranial abnormality.  She had moderate chronic small vessel ischemic disease.  Past Medical History:  Diagnosis Date   Allergic rhinitis    Anemia    Anxiety     Bronchiectasis    Carpal tunnel syndrome 07/13/2015   Bilateral   CVA (cerebral infarction) 10/2007   Right thalamic    Diverticulosis of colon    Gait disorder    GERD (gastroesophageal reflux disease)    History of breast cancer    HTN (hypertension)    Hyperlipidemia    Idiopathic pulmonary fibrosis    Left knee DJD    Migraine    Osteoporosis    Peripheral neuropathy    PMR (polymyalgia rheumatica) (HCC)    Polyneuropathy in other diseases classified elsewhere (HCC) 02/17/2013   Previous back surgery 10/09/12   Renal insufficiency    Rheumatoid arthritis (HCC)    Seizure disorder (HCC)    Spondylosis, cervical, with myelopathy 08/31/2015   C3-4 myelopathy   Temporal arteritis (HCC)    Right eye blind, on steroids per Neruro/ Dr Anne Hahn   Type II or unspecified type diabetes mellitus without mention of complication, not stated as uncontrolled    2nd to steriods   Vitamin D deficiency    Past Surgical History:  Procedure Laterality Date   APPENDECTOMY  01/2021   CARPAL TUNNEL RELEASE Right    CATARACT EXTRACTION     OS - Summer 2010   CERVICAL FUSION  09/24/2008   4 rods and pins in place   CHOLECYSTECTOMY     COLONOSCOPY  12/15/2011   Procedure:  COLONOSCOPY;  Surgeon: Charna Elizabeth, MD;  Location: [facility];  Service: Endoscopy;  Laterality: N/A;   LUMBAR FUSION  04/24/2010   W/Mechanical fixation - L2-5 [facility]   LUMBAR FUSION  09/25/2011   rods in hips (to stabilize).   MASTECTOMY     NECK SURGERY     rheumatoid nodule removal     SPINAL FUSION  07/25/2010   T10-L2 interbody fusion / [facility]   TONSILLECTOMY     TOTAL KNEE ARTHROPLASTY     Left    reports that she has never smoked. She has never used smokeless tobacco. She reports that she does not currently use alcohol. She reports that she does not use drugs. family history includes Arthritis in her father and mother; Brain cancer in her mother; Breast cancer in her sister; Dementia in  her father; Hypertension in her father and mother; Lung cancer in her sister. Allergies  Allergen Reactions   Atorvastatin Nausea Only and Other (See Comments)    Dizzyness.    Hydromorphone Other (See Comments)    Cognitive changes  "made me unconscious" Dilaudid    Review of Systems  Constitutional:  Negative for chills, fever and weight loss.  Eyes:  Negative for blurred vision.  Cardiovascular:  Negative for chest pain.  Genitourinary:  Negative for dysuria.  Neurological:  Negative for dizziness, speech change, focal weakness, seizures, loss of consciousness and headaches.      Objective:     BP (!) 158/68 (BP Location: Right Arm, Cuff Size: Normal)   Pulse (!) 57   Temp 98 F (36.7 C) (Oral)   Wt 123 lb 12.8 oz (56.2 kg)   SpO2 97%   BMI 23.39 kg/m  BP Readings from Last 3 Encounters:  12/27/23 (!) 158/68  11/22/23 134/76  10/28/23 (!) 154/76   Wt Readings from Last 3 Encounters:  12/27/23 123 lb 12.8 oz (56.2 kg)  11/22/23 121 lb 14.4 oz (55.3 kg)  10/28/23 124 lb 11.2 oz (56.6 kg)      Physical Exam Vitals reviewed.  Constitutional:      Appearance: Normal appearance.  HENT:     Head: Normocephalic and atraumatic.  Cardiovascular:     Rate and Rhythm: Normal rate and regular rhythm.  Pulmonary:     Effort: Pulmonary effort is normal.     Breath sounds: Normal breath sounds.  Musculoskeletal:     Right lower leg: No edema.     Left lower leg: No edema.  Neurological:     General: No focal deficit present.     Mental Status: She is alert.     Cranial Nerves: No cranial nerve deficit.     Motor: No weakness.     Comments: MMSE 27/30.        No results found for any visits on 12/27/23.  Last CBC Lab Results  Component Value Date   WBC 6.1 11/19/2023   HGB 12.7 11/19/2023   HCT 39.1 11/19/2023   MCV 100 (H) 11/19/2023   MCH 32.5 11/19/2023   RDW 12.6 11/19/2023   PLT 266 11/19/2023   Last metabolic panel Lab Results  Component Value  Date   GLUCOSE 96 11/19/2023   NA 142 11/19/2023   K 3.9 11/19/2023   CL 100 11/19/2023   CO2 27 11/19/2023   BUN 18 11/19/2023   CREATININE 1.46 (H) 11/19/2023   EGFR 35 (L) 11/19/2023   CALCIUM 9.6 11/19/2023   PHOS 2.0 (L) 08/02/2022   PROT 6.8  11/19/2023   ALBUMIN 4.1 11/19/2023   LABGLOB 2.7 11/19/2023   BILITOT 1.2 11/19/2023   ALKPHOS 80 11/19/2023   AST 23 11/19/2023   ALT 11 11/19/2023   ANIONGAP 12 08/02/2022   Last lipids Lab Results  Component Value Date   CHOL 143 08/03/2022   HDL 47 08/03/2022   LDLCALC 85 08/03/2022   TRIG 56 08/03/2022   CHOLHDL 3.0 08/03/2022   Last hemoglobin A1c Lab Results  Component Value Date   HGBA1C 5.0 08/03/2022   Last thyroid functions Lab Results  Component Value Date   TSH 2.490 11/19/2023      The ASCVD Risk score (Arnett DK, et al., 2019) failed to calculate for the following reasons:   The 2019 ASCVD risk score is only valid for ages 29 to 52    Assessment & Plan:   #1 mild cognitive impairment history.  She did have recent episode about a week ago where she woke up with some transient confusion which seemed to clear fairly rapidly after couple hours.  She has had none since.  ? Recent TIA.   Overall, fairly clear in her thinking today.  She did score 27/30 on MMSE.  Family history of Alzheimer's in sister.  Patient would like to pursue further testing and evaluation.  Set up neurology referral  #2 history of frequent falls.  Patient currently benefiting from physical therapy.  Continue to finish out physical therapy and strongly advised to continue her home exercises  #3 hypertension.  Initial reading up today but did come down some after rest.  Still slightly elevated.  We recommend close home monitoring over the next couple weeks and to be a touch if consistently greater than 140 systolic.  Continue amlodipine 5 mg daily.  Evelena Peat, MD

## 2023-12-31 ENCOUNTER — Other Ambulatory Visit: Payer: Self-pay | Admitting: Family Medicine

## 2023-12-31 NOTE — Telephone Encounter (Signed)
 Copied from CRM [id number]. Topic: Clinical - Medication Refill >> Dec 31, 2023 10:11 AM Drema Balzarine wrote: Most Recent Primary Care Visit:  Provider: Kristian Covey  Department: [facility]  Visit Type: OFFICE VISIT  Date: 12/27/2023  Medication: oxyCODONE (OXYCONTIN) 10 mg 12 hr tablet  Has the patient contacted their pharmacy? Yes (Agent: If no, request that the patient contact the pharmacy for the refill. If patient does not wish to contact the pharmacy document the reason why and proceed with request.) (Agent: If yes, when and what did the pharmacy advise?)  Is this the correct pharmacy for this prescription? Yes If no, delete pharmacy and type the correct one.  This is the patient's preferred pharmacy:   [facility] - [place], [place] - [address] [place] [address] Phone: [phone] Fax: [phone]   Has the prescription been filled recently? Yes  Is the patient out of the medication? No, 6 pills left   Has the patient been seen for an appointment in the last year OR does the patient have an upcoming appointment? Yes  Can we respond through MyChart? Yes  Agent: Please be advised that Rx refills may take up to 3 business days. We ask that you follow-up with your pharmacy.

## 2024-01-01 MED ORDER — OXYCODONE HCL ER 10 MG PO T12A: 10.0000 mg | EXTENDED_RELEASE_TABLET | Freq: Two times a day (BID) | ORAL | 0 refills | Status: DC

## 2024-01-22 DIAGNOSIS — I129 Hypertensive chronic kidney disease with stage 1 through stage 4 chronic kidney disease, or unspecified chronic kidney disease: Secondary | ICD-10-CM | POA: Diagnosis not present

## 2024-01-22 DIAGNOSIS — N189 Chronic kidney disease, unspecified: Secondary | ICD-10-CM | POA: Diagnosis not present

## 2024-01-22 DIAGNOSIS — M4802 Spinal stenosis, cervical region: Secondary | ICD-10-CM | POA: Diagnosis not present

## 2024-01-22 DIAGNOSIS — M069 Rheumatoid arthritis, unspecified: Secondary | ICD-10-CM | POA: Diagnosis not present

## 2024-01-22 DIAGNOSIS — J8410 Pulmonary fibrosis, unspecified: Secondary | ICD-10-CM | POA: Diagnosis not present

## 2024-01-22 DIAGNOSIS — R32 Unspecified urinary incontinence: Secondary | ICD-10-CM | POA: Diagnosis not present

## 2024-01-22 DIAGNOSIS — D631 Anemia in chronic kidney disease: Secondary | ICD-10-CM | POA: Diagnosis not present

## 2024-01-22 DIAGNOSIS — N1832 Chronic kidney disease, stage 3b: Secondary | ICD-10-CM | POA: Diagnosis not present

## 2024-01-23 ENCOUNTER — Other Ambulatory Visit: Payer: Self-pay | Admitting: Family Medicine

## 2024-01-24 LAB — LAB REPORT - SCANNED
Creatinine, POC: 103.9 mg/dL
EGFR: 40

## 2024-01-27 ENCOUNTER — Telehealth: Payer: Self-pay | Admitting: Family Medicine

## 2024-01-27 DIAGNOSIS — G3184 Mild cognitive impairment, so stated: Secondary | ICD-10-CM

## 2024-01-27 NOTE — Telephone Encounter (Signed)
Patient informed and referral placed.

## 2024-01-27 NOTE — Addendum Note (Signed)
 Addended by: Aurelio Leer on: 01/27/2024 01:27 PM   Modules accepted: Orders

## 2024-01-27 NOTE — Telephone Encounter (Signed)
 LVM on 5/2 to see what referral she was inquiring about.   Please advise.    Copied from CRM [id number]. Topic: Referral - Status >> Jan 24, 2024 12:07 PM Earnestine Goes B wrote: Reason for CRM: pt called to follow up on referral to neurologist. Please call pt with a update at [phone]

## 2024-01-28 ENCOUNTER — Telehealth: Payer: Self-pay | Admitting: Neurology

## 2024-01-28 NOTE — Telephone Encounter (Signed)
 Documented in separate phone note. Needs referral

## 2024-01-28 NOTE — Telephone Encounter (Signed)
 Patient said, having some memory loss, forget words, cannot remember things from last week, cannot find where I put things. Sister has dementia, want to get on top of it if this is happening to me. Would like a call from the nurse to discuss.

## 2024-01-28 NOTE — Telephone Encounter (Signed)
 Call to patient and advised that she would need new referral. She verbalized understanding

## 2024-01-28 NOTE — Telephone Encounter (Signed)
 Patient said having some memory loss, maybe forget words, can not remember things that happen last week, forget where things are. My sister has dementia and to be on top of things if this is happening to me. Would like a call from the  nurse.

## 2024-01-29 ENCOUNTER — Telehealth: Payer: Self-pay | Admitting: Family Medicine

## 2024-01-29 NOTE — Telephone Encounter (Signed)
 Copied from CRM [id number]. Topic: General - Phone/Fax/Address >> Jan 28, 2024  1:57 PM Jenice Mitts wrote: Patient/patient representative is calling for clinic's phone, fax, or address information.   [facility] [phone] [place], [place] [address] [phone] >> Jan 28, 2024  3:29 PM Allyne Areola wrote: Patient is calling because she spoke with [facility] regarding a referral she received. They were unable to schedule the patient due to an issue with the referral. She is not sure what's going on but they asked that she contacted Dr.Burchette's office.

## 2024-01-30 DIAGNOSIS — M5136 Other intervertebral disc degeneration, lumbar region with discogenic back pain only: Secondary | ICD-10-CM | POA: Diagnosis not present

## 2024-01-30 DIAGNOSIS — R600 Localized edema: Secondary | ICD-10-CM | POA: Diagnosis not present

## 2024-01-30 DIAGNOSIS — M810 Age-related osteoporosis without current pathological fracture: Secondary | ICD-10-CM | POA: Diagnosis not present

## 2024-01-30 DIAGNOSIS — M1991 Primary osteoarthritis, unspecified site: Secondary | ICD-10-CM | POA: Diagnosis not present

## 2024-01-30 DIAGNOSIS — M79645 Pain in left finger(s): Secondary | ICD-10-CM | POA: Diagnosis not present

## 2024-01-30 DIAGNOSIS — M0589 Other rheumatoid arthritis with rheumatoid factor of multiple sites: Secondary | ICD-10-CM | POA: Diagnosis not present

## 2024-01-30 DIAGNOSIS — M316 Other giant cell arteritis: Secondary | ICD-10-CM | POA: Diagnosis not present

## 2024-01-30 DIAGNOSIS — M79672 Pain in left foot: Secondary | ICD-10-CM | POA: Diagnosis not present

## 2024-01-30 DIAGNOSIS — Z6822 Body mass index (BMI) 22.0-22.9, adult: Secondary | ICD-10-CM | POA: Diagnosis not present

## 2024-02-04 DIAGNOSIS — M810 Age-related osteoporosis without current pathological fracture: Secondary | ICD-10-CM | POA: Diagnosis not present

## 2024-02-05 DIAGNOSIS — M0589 Other rheumatoid arthritis with rheumatoid factor of multiple sites: Secondary | ICD-10-CM | POA: Diagnosis not present

## 2024-02-14 ENCOUNTER — Ambulatory Visit (INDEPENDENT_AMBULATORY_CARE_PROVIDER_SITE_OTHER): Admitting: Family Medicine

## 2024-02-14 VITALS — BP 130/62 | HR 79 | Temp 97.8°F | Wt 124.6 lb

## 2024-02-14 DIAGNOSIS — R300 Dysuria: Secondary | ICD-10-CM

## 2024-02-14 LAB — POC URINALSYSI DIPSTICK (AUTOMATED)
Bilirubin, UA: NEGATIVE
Blood, UA: POSITIVE
Glucose, UA: NEGATIVE
Ketones, UA: NEGATIVE
Nitrite, UA: NEGATIVE
Protein, UA: POSITIVE — AB
Spec Grav, UA: 1.015 (ref 1.010–1.025)
Urobilinogen, UA: 0.2 U/dL
pH, UA: 6 (ref 5.0–8.0)

## 2024-02-14 MED ORDER — CEPHALEXIN 500 MG PO CAPS: 500.0000 mg | ORAL_CAPSULE | Freq: Three times a day (TID) | ORAL | 0 refills | Status: DC

## 2024-02-14 NOTE — Progress Notes (Signed)
 Established Patient Office Visit  Subjective   Patient ID: [name], female    DOB: 1941/02/03  Age: 83 y.o. MRN: [id number]  No chief complaint on file.   HPI   [name] is seen with approximately one and 1/2-week history of some burning with urination.  Symptoms relatively mild.  Denies any cloudy urine or gross hematuria.  No flank pain.  No fevers or chills.  Denies any history of recent UTI.  In fact, has not had urine culture since 2022.  Denies any vaginal discharge.  Past Medical History:  Diagnosis Date   Allergic rhinitis    Anemia    Anxiety    Bronchiectasis    Carpal tunnel syndrome 07/13/2015   Bilateral   CVA (cerebral infarction) 10/2007   Right thalamic    Diverticulosis of colon    Gait disorder    GERD (gastroesophageal reflux disease)    History of breast cancer    HTN (hypertension)    Hyperlipidemia    Idiopathic pulmonary fibrosis    Left knee DJD    Migraine    Osteoporosis    Peripheral neuropathy    PMR (polymyalgia rheumatica) (HCC)    Polyneuropathy in other diseases classified elsewhere (HCC) 02/17/2013   Previous back surgery 10/09/12   Renal insufficiency    Rheumatoid arthritis (HCC)    Seizure disorder (HCC)    Spondylosis, cervical, with myelopathy 08/31/2015   C3-4 myelopathy   Temporal arteritis (HCC)    Right eye blind, on steroids per [facility]er[facility]/ Dr Tilda Fogo   Type II or unspecified type diabetes mellitus without mention of complication, not stated as uncontrolled    2nd to steriods   Vitamin D deficiency    Past Surgical History:  Procedure Laterality Date   APPENDECTOMY  01/2021   CARPAL TUNNEL RELEASE Right    CATARACT EXTRACTION     OS - Summer 2010   CERVICAL FUSION  09/24/2008   4 rods and pins in place   CHOLECYSTECTOMY     COLONOSCOPY  12/15/2011   Procedure: COLONOSCOPY;  Surgeon: Tami Falcon, MD;  Location: [facility];  Service: Endoscopy;  Laterality: N/A;   LUMBAR FUSION  04/24/2010   W/Mechanical  fixation - L2-5 [facility]   LUMBAR FUSION  09/25/2011   rods in hips (to stabilize).   MASTECTOMY     NECK SURGERY     rheumatoid nodule removal     SPINAL FUSION  07/25/2010   T10-L2 interbody fusion / [facility]   TONSILLECTOMY     TOTAL KNEE ARTHROPLASTY     Left    reports that she has never smoked. She has never used smokeless tobacco. She reports that she does not currently use alcohol. She reports that she does not use drugs. family history includes Arthritis in her father and mother; Brain cancer in her mother; Breast cancer in her sister; Dementia in her father; Hypertension in her father and mother; Lung cancer in her sister. Allergies  Allergen Reactions   Atorvastatin  Nausea Only and Other (See Comments)    Dizzyness.    Hydromorphone Other (See Comments)    Cognitive changes  "made me unconscious" Dilaudid    Review of Systems  Constitutional:  Negative for chills and fever.  Gastrointestinal:  Negative for abdominal pain, nausea and vomiting.  Genitourinary:  Positive for dysuria. Negative for flank pain and hematuria.      Objective:      BP 130/62 (BP Location: Left Arm, Cuff  Size: Normal)   Pulse 79   Temp 97.8 F (36.6 C) (Oral)   Wt 124 lb 9.6 oz (56.5 kg)   SpO2 97%   BMI 23.54 kg/m  BP Readings from Last 3 Encounters:  02/14/24 130/62  12/27/23 (!) 158/68  11/22/23 134/76   Wt Readings from Last 3 Encounters:  02/14/24 124 lb 9.6 oz (56.5 kg)  12/27/23 123 lb 12.8 oz (56.2 kg)  11/22/23 121 lb 14.4 oz (55.3 kg)      Physical Exam Vitals reviewed.  Constitutional:      General: She is not in acute distress.    Appearance: She is not ill-appearing.  Cardiovascular:     Rate and Rhythm: Normal rate and regular rhythm.  Pulmonary:     Comments: She has crackles in both lung bases which is chronic related to pulmonary fibrosis noted on previous CT scan Neurological:     Mental Status: She is alert.      Results for  orders placed or performed in visit on 02/14/24  POCT Urinalysis Dipstick (Automated)  Result Value Ref Range   Color, UA Yellow    Clarity, UA Clear    Glucose, UA Negative Negative   Bilirubin, UA Negative    Ketones, UA negative    Spec Grav, UA 1.015 1.010 - 1.025   Blood, UA Positive    pH, UA 6.0 5.0 - 8.0   Protein, UA Positive (A) Negative   Urobilinogen, UA 0.2 0.2 or 1.0 E.U./dL   Nitrite, UA Negative    Leukocytes, UA Trace (A) Negative      The ASCVD Risk score (Arnett DK, et al., 2019) failed to calculate for the following reasons:   The 2019 ASCVD risk score is only valid for ages 42 to 26    Assessment & Plan:   Problem List Items Addressed This Visit   None Visit Diagnoses       Dysuria    -  Primary   Relevant Orders   POCT Urinalysis Dipstick (Automated) (Completed)   Urine Culture     Patient presents with 1-1/2-week history of some burning with urination.  Urine dipstick is positive for blood and trace leukocytes.  Nitrite negative.  Urine culture sent.  Stay well-hydrated.  Start cephalexin 500 mg 3 times daily pending culture result  If culture comes back negative recommend follow-up urinalysis with reflex microscopy if indicated in about 2 weeks  No follow-ups on file.    Glean Lamy, MD

## 2024-02-16 LAB — URINE CULTURE
MICRO NUMBER:: 16494824
SPECIMEN QUALITY:: ADEQUATE

## 2024-02-17 ENCOUNTER — Ambulatory Visit: Payer: Self-pay | Admitting: Family Medicine

## 2024-02-17 ENCOUNTER — Other Ambulatory Visit: Payer: Self-pay | Admitting: Family Medicine

## 2024-02-21 ENCOUNTER — Telehealth: Payer: Self-pay | Admitting: Family Medicine

## 2024-02-21 ENCOUNTER — Ambulatory Visit: Payer: Self-pay

## 2024-02-21 NOTE — Telephone Encounter (Signed)
 Copied from CRM [id number]. Topic: Clinical - Medication Refill >> Feb 21, 2024  3:29 PM Leah C wrote: Medication: cephALEXin  (KEFLEX ) 500 MG capsule- Patient is going out of town in the morning and would like to have script before then.   Has the patient contacted their pharmacy? Yes (Agent: If no, request that the patient contact the pharmacy for the refill. If patient does not wish to contact the pharmacy document the reason why and proceed with request.) (Agent: If yes, when and what did the pharmacy advise?)  This is the patient's preferred pharmacy:  [id number] - [place], [place] - [address] AT [facility] [address] [facility] [address] [place] [address] Phone: [phone] Fax: [phone]  Is this the correct pharmacy for this prescription? Yes If no, delete pharmacy and type the correct one.   Has the prescription been filled recently? Yes  Is the patient out of the medication? Yes Patient is out and would like to have script before going out of town.   Has the patient been seen for an appointment in the last year OR does the patient have an upcoming appointment? Yes  Can we respond through MyChart? Yes  Agent: Please be advised that Rx refills may take up to 3 business days. We ask that you follow-up with your pharmacy.

## 2024-02-21 NOTE — Telephone Encounter (Signed)
 02/21/24- Refill request rec'd for Keflex. Medication was ordered on 5/23 for dysuria, will need triage to assess need for additional medication. Called, female voice asnwered the phone then hung up when this RN asked to speak with patient. Called again, no answer, LVM

## 2024-02-21 NOTE — Telephone Encounter (Signed)
  Chief Complaint: UTI FU  Symptoms: still having some dysuria  Frequency: 02/14/24 Pertinent Negatives: NA Disposition: [] ED /[] Urgent Care (no appt availability in office) / [] Appointment(In office/virtual)/ []  [facility]/ [] Home Care/ [] Refused Recommended Disposition /[] [facility]/ [x]  Follow-up with PCP Additional Notes: pt is asking for refill on Keflex. States she completed yesterday and still having some lingering discomfort and burning. Pt is going out of town for the weekend and wanted to see if PCP could refill for short dose to cover sx. Called [facility] and spoke with Kristeen Peto. She states PCP is OOO today and other providers wouldn't prescribe. I informed pt and advised her to monitor sx and if still present to call back Monday when PCP back in office. Also gave care advice and pt asked was there anything she can take OTC to help with sx until Monday if they continue. I recommended pt can try AZO to help with sx and if no better be seen Monday. Pt verbalized understanding.

## 2024-02-21 NOTE — Telephone Encounter (Signed)
 E2c2 agent called and pt is going out of town and would like a refill on abx and I told agent md not in office and for agent to let pt speak with triage nurse also told agent I do not think provider will call in abx refill

## 2024-02-21 NOTE — Telephone Encounter (Signed)
 Reason for Disposition . [1] Taking antibiotic > 72 hours (3 days) for UTI AND [2] painful urination or frequency is SAME (unchanged, not better)  Answer Assessment - Initial Assessment Questions 1. MAIN SYMPTOM: "What is the main symptom you are concerned about?" (e.g., painful urination, urine frequency)     Still having dysuria  2. BETTER-SAME-WORSE: "Are you getting better, staying the same, or getting worse compared to how you felt at your last visit to the doctor (most recent medical visit)?"     Better but still lingering  3. PAIN: "How bad is the pain?"  (e.g., Scale 1-10; mild, moderate, or severe)   - MILD (1-3): complains slightly about urination hurting   - MODERATE (4-7): interferes with normal activities     - SEVERE (8-10): excruciating, unwilling or unable to urinate because of the pain      Mild  5. OTHER SYMPTOMS: "Do you have any other symptoms?" (e.g., blood in the urine, flank pain, vaginal discharge)      6. DIAGNOSIS: "When was the UTI diagnosed?" "By whom?" "Was it a kidney infection, bladder infection or both?"     UTI 7. ANTIBIOTIC: "What antibiotic(s) are you taking?" "How many times per day?"     Keflex TID x 5 days  8. ANTIBIOTIC - START DATE: "When did you start taking the antibiotic?"     02/14/24 completed yesterday  Protocols used: Urinary Tract Infection on Antibiotic Follow-up Call - [facility]

## 2024-02-21 NOTE — Telephone Encounter (Signed)
 3rd attempt: Pt is requested refill on Keflex for UTI. Message left. Please advise

## 2024-02-25 ENCOUNTER — Encounter: Payer: Self-pay | Admitting: Neurology

## 2024-02-25 ENCOUNTER — Ambulatory Visit (INDEPENDENT_AMBULATORY_CARE_PROVIDER_SITE_OTHER): Admitting: Neurology

## 2024-02-25 VITALS — BP 116/70 | Ht 61.0 in | Wt 124.0 lb

## 2024-02-25 DIAGNOSIS — G309 Alzheimer's disease, unspecified: Secondary | ICD-10-CM | POA: Diagnosis not present

## 2024-02-25 DIAGNOSIS — R269 Unspecified abnormalities of gait and mobility: Secondary | ICD-10-CM | POA: Diagnosis not present

## 2024-02-25 DIAGNOSIS — M316 Other giant cell arteritis: Secondary | ICD-10-CM | POA: Diagnosis not present

## 2024-02-25 DIAGNOSIS — G3184 Mild cognitive impairment, so stated: Secondary | ICD-10-CM

## 2024-02-25 NOTE — Progress Notes (Signed)
 ASSESSMENT AND PLAN  [name] is a 83 y.o. female    History of temporal arteritis Worsening gait abnormality  Keep current low-dose of prednisone  4 mg daily  Her gait abnormality, increased fall likely multifactorial, increased confusion, deformity of left foot, left ankle weakness, residual deficit from previous cervical myelopathy, Worsening Memory loss  MoCA examination is 20/30 today,  Family history of dementia, sister of 70 years old suffered advanced dementia, needing care in daily activity  Multiple previous CT head, March 2025, MRIs of the brain showed moderate small vessel disease, moderate parenchymal volume loss,  Discussed with patient, we will proceed with Alzheimer related profile, also laboratory evaluation to rule out treatable etiology, brain  amyloid PET scan  Her son is a practicing psychiatrist in different state, I talked with her daughter-in-law in February 2025, concerning her frequent falling, safety issues, patient has been insistent on living independently, I do worry about her safety issues, already have significant gait abnormality, frequent fall in the past, now with memory concerns, patient to want me to hold off discussion with her son   DIAGNOSTIC DATA (LABS, IMAGING, TESTING) - I reviewed patient records, labs, notes, testing and imaging myself where available. CXR on Aug 02 2022 1. No active cardiopulmonary disease. 2. Interstitial prominence predominantly at the lung bases, compatible with known interstitial lung disease. 3. Aortic atherosclerosis.  Lab in Nov 2023: CMP, creat 1.51, CBC, Hg 13.5, TSH, CPK, Troponin, A1C 5.0,   MEDICAL HISTORY:  [name] is a 83 year old female, accompanied by her l longtime friend Devra Fontana to follow-up on her recent hospital admission for passing out episode,  I reviewed and summarized the referring note. PMHX. 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 Tilda Fogo in the past, temporal arteritis, with polymyalgia rheumatica, with sudden onset of right visual loss, she has been treated with prednisone  for a long time, gradually tapering dose, currently taking 4 mg daily, reported relapse of body achy pain if the dosage tapering down to below 3 mg daily  She also has rheumatoid arthritis, gait abnormality, using walker since 2014, bilateral lower extremity neuropathic pain, required variable dose of neuropathic pain medications  At baseline, she lives independently alone at her house, drive, ambulate with walker, her only son is a physician at [employer] [employer] ,  is in the process of moving patient to a facility to be closer to him  Hospital admission from November 9 to 10, 2023, around 10 AM August 02, 2022, she woke up, went to the bathroom to urinate, was able to wash her hands afterwards, while washing her hands, she felt lightheaded, dizzy," her brain feels so big", she took a quick steps to her chair, slumped into the chair, apparently lost consciousness for about 2 hours, she came around calling her friend Devra Fontana, was noted to have slurred speech, not herself, Devra Fontana urged her to call ambulance, was taken to the hospital  Prior to the incident, she has suffered left lower extremity abrasion infection due to  fall accident, she was taking antibiotics, did not have good appetite, was not able to keep up her fluid intake, was seen by wound care, pain medication OxyContin  10 mg twice a day  At the hospital, ultrasound of carotid artery showed no significant stenosis  Echocardiogram showed normal ejection fraction  LDL 85 A1c 5.0, TSH within normal limit, UA showed no evidence of infection, EKG  showed no significant abnormality  MRI of the brain no acute intracranial abnormality, moderate small vessel disease, moderate atrophy  MRA of the brain showed no large vessel disease,  Worried about the TIA,  she was given the prescription of Lipitor combination of aspirin  and Plavix  upon discharge, she has not taking Lipitor and Plavix ,  Also reported a history of seizure, few years ago, was found by family on the floor, no antiepileptic medications  EEG in December 2023 was normal. Remains on 4 mg prednisone , with 3 mg dose felt bad. No further spells of passing out. Has started drinking boost protein, food with medications. Living at home. Has walker. Getting around okay, 3 falls since November. On aspirin  81 mg daily. Planning to move to retirement homes in [facility], where her son lives, within the next 2 years. Has noted some changes to her short term memory. MOCA 26/30. Looks very well today.   UPDATE Nov 19 2023: She continued to decline slowly, lives alone, drove herself to clinic today, increased gait abnormality, increased fall accident recently, multiple lower extremity skin abrasion, large size right hip hematoma has been ongoing for few months  She is here for EMG nerve conduction study to evaluate her increased weakness, gait abnormality, which showed evidence of moderate axonal sensorimotor polyneuropathy, chronic right cervical lumbar radiculopathy, no evidence of intrinsic muscle disease  She presented to emergency room on October 19, 2023 after fell, struck her face and her knee on the ground,  CT head without contrast showed forehead scalp hematoma, no acute intracranial abnormality, chronic small vessel disease  CT cervical spine showed nondisplaced the bilateral C7 pedicle fracture, cervical ACDF C3-C7, superimposed multilevel degenerative changes, chronic mild T3 compression fracture,  UPDATE February 25 2024: She drove her off to clinic today, worry about her memory loss, she lives alone, drove to [place] to take care of her sister of 69 years old regularly, who suffered severe dementia, needing help in her daily activity, patient noticed mild memory loss over the past few months, forgot  conversations, but still able to manage living alone,  Her son is a Teacher, adult education in different state, I talked with her daughter-in-law in February 2025, concerning her frequent falling, safety issues, patient has been insistent on living independently, I do worry about her safety issues, already have significant gait abnormality, frequent fall in the past, now with memory concerns, patient to want me to hold off discussion with her son, will initiate evaluation including Alzheimer related profile, brain beta amyloid scan  PHYSICAL EXAM:    02/25/2024   10:47 AM 02/14/2024   11:10 AM 02/14/2024   11:00 AM  Vitals with BMI  Height 5\' 1"     Weight 124 lbs  124 lbs 10 oz  BMI 23.44    Systolic 116 130 161  Diastolic 70 62 70  Pulse   79       02/25/2024   10:58 AM 07/04/2023    1:39 PM 12/20/2022   12:22 PM  Montreal Cognitive Assessment   Visuospatial/ Executive (0/5) 2 4 4   Naming (0/3) 3 3 3   Attention: Read list of digits (0/2) 2 2 2   Attention: Read list of letters (0/1) 1 1 1   Attention: Serial 7 subtraction starting at 100 (0/3) 2 2 2   Language: Repeat phrase (0/2) 1 2 1   Language : Fluency (0/1) 1 1 1   Abstraction (0/2) 2 2 2   Delayed Recall (0/5) 0 1 4  Orientation (0/6) 6 6 6  Total 20 24 26   Adjusted Score (based on education)   26     PHYSICAL EXAMNIATION:  Gen: NAD, conversant, well nourised, well groomed                     Cardiovascular: Regular rate rhythm, no peripheral edema, warm, nontender. Eyes: Conjunctivae clear without exudates or hemorrhage Neck: Supple, no carotid bruits. Pulmonary: Clear to auscultation bilaterally   NEUROLOGICAL EXAM:  MENTAL STATUS: Speech/Cognition: Fragile, elderly female, awake, alert, normal speech, oriented to history taking and casual conversation.  CRANIAL NERVES: CN II: Visual fields are full to confrontation.  Pupils are round equal and briskly reactive to light. CN III, IV, VI: extraocular movement are  normal. No ptosis. CN V: Facial sensation is intact to light touch. CN VII: Face is symmetric with normal eye closure and smile. CN VIII: Hearing is normal to casual conversation CN IX, X: Palate elevates symmetrically. Phonation is normal. CN XI: Head turning and shoulder shrug are intact CN XII: Tongue is midline with normal movements and no atrophy.  MOTOR: Deformity of left foot, collapsed left foot arch, mild to moderate left ankle dorsiflexion weakness,    REFLEXES: Hyperreflexia of bilateral triceps, patella, bilateral Babinski signs,  SENSORY: Length-dependent decreased light touch, pinprick, vibratory sensation to knee level  COORDINATION: There is no trunk or limb ataxia.    GAIT/STANCE: Push-up to get up from seated position reliant on her walker, dragging left leg more  REVIEW OF SYSTEMS:  Full 14 system review of systems performed and notable only for as above All other review of systems were negative.   ALLERGIES: Allergies  Allergen Reactions   Atorvastatin  Nausea Only and Other (See Comments)    Dizzyness.    Hydromorphone Other (See Comments)    Cognitive changes  "made me unconscious" Dilaudid    HOME MEDICATIONS: Current Outpatient Medications  Medication Sig Dispense Refill   amLODipine  (NORVASC ) 5 MG tablet TAKE 1 TABLET DAILY 90 tablet 1   Calcium  Carb-Cholecalciferol  (CALCIUM  CARBONATE-VITAMIN D3 PO) Take 1 tablet by mouth daily     cephALEXin  (KEFLEX ) 500 MG capsule Take 1 capsule (500 mg total) by mouth 3 (three) times daily. 15 capsule 0   cholecalciferol  (VITAMIN D) 1000 UNITS tablet Take 1,000 Units by mouth daily.     denosumab  (PROLIA ) 60 MG/ML SOLN injection Inject 60 mg into the skin every 6 (six) months.     docusate sodium  (COLACE) 100 MG capsule Take 100 mg by mouth 2 (two) times daily.     furosemide  (LASIX ) 40 MG tablet TAKE 1 TABLET DAILY 90 tablet 1   gabapentin  (NEURONTIN ) 300 MG capsule TAKE 1 CAPSULE TWICE A DAY 180 capsule 1    Golimumab  (SIMPONI  ARIA IV) Inject into the vein. Takes infusion every 8 wks.     Multiple Vitamins-Minerals (MULTIVITAMIN,TX-MINERALS) tablet Take 1 tablet by mouth daily.     Omega-3 Fatty Acids (FISH OIL OMEGA-3 PO) Take 1 capsule by mouth daily.     oxyCODONE  (OXYCONTIN ) 10 mg 12 hr tablet Take 1 tablet (10 mg total) by mouth every 12 (twelve) hours. 180 tablet 0   Oxycodone  HCl 10 MG TABS Take 1 tablet (10 mg total) by mouth every 6 (six) hours as needed. Take 1 tablet by mouth every 6 hours as needed for breakthrough pain 20 tablet 0   potassium chloride  (KLOR-CON  M) 10 MEQ tablet TAKE 1 TABLET TWICE A DAY 180 tablet 1   predniSONE  (DELTASONE ) 1 MG tablet  TAKE 4 TABLETS DAILY WITH BREAKFAST 360 tablet 3   vitamin B-12 (CYANOCOBALAMIN ) 100 MCG tablet Take 100 mcg by mouth daily.     No current facility-administered medications for this visit.    PAST MEDICAL HISTORY: Past Medical History:  Diagnosis Date   Allergic rhinitis    Anemia    Anxiety    Bronchiectasis    Carpal tunnel syndrome 07/13/2015   Bilateral   CVA (cerebral infarction) 10/2007   Right thalamic    Diverticulosis of colon    Gait disorder    GERD (gastroesophageal reflux disease)    History of breast cancer    HTN (hypertension)    Hyperlipidemia    Idiopathic pulmonary fibrosis    Left knee DJD    Migraine    Osteoporosis    Peripheral neuropathy    PMR (polymyalgia rheumatica) (HCC)    Polyneuropathy in other diseases classified elsewhere (HCC) 02/17/2013   Previous back surgery 10/09/12   Renal insufficiency    Rheumatoid arthritis (HCC)    Seizure disorder (HCC)    Spondylosis, cervical, with myelopathy 08/31/2015   C3-4 myelopathy   Temporal arteritis (HCC)    Right eye blind, on steroids per [facility]er[facility]/ Dr Tilda Fogo   Type II or unspecified type diabetes mellitus without mention of complication, not stated as uncontrolled    2nd to steriods   Vitamin D deficiency     PAST SURGICAL HISTORY: Past  Surgical History:  Procedure Laterality Date   APPENDECTOMY  01/2021   CARPAL TUNNEL RELEASE Right    CATARACT EXTRACTION     OS - Summer 2010   CERVICAL FUSION  09/24/2008   4 rods and pins in place   CHOLECYSTECTOMY     COLONOSCOPY  12/15/2011   Procedure: COLONOSCOPY;  Surgeon: Tami Falcon, MD;  Location: [facility];  Service: Endoscopy;  Laterality: N/A;   LUMBAR FUSION  04/24/2010   W/Mechanical fixation - L2-5 [facility]   LUMBAR FUSION  09/25/2011   rods in hips (to stabilize).   MASTECTOMY     NECK SURGERY     rheumatoid nodule removal     SPINAL FUSION  07/25/2010   T10-L2 interbody fusion / [facility]   TONSILLECTOMY     TOTAL KNEE ARTHROPLASTY     Left    FAMILY HISTORY: Family History  Problem Relation Age of Onset   Brain cancer Mother    Hypertension Mother    Arthritis Mother    Dementia Father    Hypertension Father    Arthritis Father    Breast cancer Sister    Lung cancer Sister    Lung disease Neg Hx    Rheumatologic disease Neg Hx     SOCIAL HISTORY: Social History   Socioeconomic History   Marital status: Widowed    Spouse name: Not on file   Number of children: 1   Years of education: 12+ coll.   Highest education level: Not on file  Occupational History   Occupation: Clinical cytogeneticist: RETIRED    Comment: retired  Tobacco Use   Smoking status: Never   Smokeless tobacco: Never   Tobacco comments:    Father   Vaping Use   Vaping status: Never Used  Substance and Sexual Activity   Alcohol use: Not Currently    Comment: heavy drinker until 1995 - sobriety with AA   Drug use: No   Sexual activity: Not Currently  Other Topics Concern  Not on file  Social History Narrative   07/03/21 friend Devra Fontana living with her   Right handed   Drinks 6-8 cups caffeine daily      Social Drivers of Health   Financial Resource Strain: Low Risk  (07/12/2023)   Overall Financial Resource  Strain (CARDIA)    Difficulty of Paying Living Expenses: Not hard at all  Food Insecurity: No Food Insecurity (07/12/2023)   Hunger Vital Sign    Worried About Running Out of Food in the Last Year: Never true    Ran Out of Food in the Last Year: Never true  Transportation Needs: No Transportation Needs (07/12/2023)   PRAPARE - Administrator, Civil Service (Medical): No    Lack of Transportation (Non-Medical): No  Physical Activity: Inactive (07/12/2023)   Exercise Vital Sign    Days of Exercise per Week: 0 days    Minutes of Exercise per Session: 0 min  Stress: No Stress Concern Present (07/12/2023)   [employer] of Occupational Health - Occupational Stress Questionnaire    Feeling of Stress : Not at all  Social Connections: Moderately Integrated (07/12/2023)   Social Connection and Isolation Panel [NHANES]    Frequency of Communication with Friends and Family: More than three times a week    Frequency of Social Gatherings with Friends and Family: More than three times a week    Attends Religious Services: More than 4 times per year    Active Member of [employer] or Organizations: Yes    Attends Banker Meetings: More than 4 times per year    Marital Status: Widowed  Intimate Partner Violence: Not At Risk (07/12/2023)   Humiliation, Afraid, Rape, and Kick questionnaire    Fear of Current or Ex-Partner: No    Emotionally Abused: No    Physically Abused: No    Sexually Abused: No    Phebe Brasil, M.D. Ph.D.  [facility] [address] [place], [place] [address] Phone: [phone] Fax:      [phone]

## 2024-03-03 ENCOUNTER — Ambulatory Visit: Payer: Medicare Other | Admitting: Neurology

## 2024-03-09 ENCOUNTER — Other Ambulatory Visit: Payer: Self-pay | Admitting: Family Medicine

## 2024-03-10 ENCOUNTER — Ambulatory Visit: Payer: Self-pay | Admitting: Neurology

## 2024-03-10 LAB — ATN PROFILE
A -- Beta-amyloid 42/40 Ratio: 0.096 — ABNORMAL LOW (ref >0.102)
Beta-amyloid 40: 231.69 pg/mL
Beta-amyloid 42: 22.35 pg/mL
N -- NfL, Plasma: 6.05 pg/mL (ref 0.00–9.13)
T -- p-tau181: 2.78 pg/mL — ABNORMAL HIGH (ref 0.00–0.97)

## 2024-03-10 LAB — APOE ALZHEIMER'S RISK

## 2024-03-10 LAB — VITAMIN B12: Vitamin B-12: >2000 pg/mL — ABNORMAL HIGH (ref 232–1245)

## 2024-03-10 LAB — RPR: RPR Ser Ql: NONREACTIVE

## 2024-03-11 ENCOUNTER — Telehealth: Payer: Self-pay

## 2024-03-11 NOTE — Telephone Encounter (Signed)
 Patient informed of the message below and voiced understanding

## 2024-03-11 NOTE — Telephone Encounter (Signed)
 Copied from CRM [id number]. Topic: Clinical - Medical Advice >> Mar 11, 2024 11:35 AM Martinique E wrote: Reason for CRM: Patient called in asking who she can get seen by in order to sew up where her ears were pierced. Patient stated the holes have gotten bigger and she has had this done in the past. She will go to whoever PCP recommends.

## 2024-04-01 DIAGNOSIS — M0589 Other rheumatoid arthritis with rheumatoid factor of multiple sites: Secondary | ICD-10-CM | POA: Diagnosis not present

## 2024-04-07 DIAGNOSIS — H4720 Unspecified optic atrophy: Secondary | ICD-10-CM | POA: Diagnosis not present

## 2024-04-08 ENCOUNTER — Ambulatory Visit (INDEPENDENT_AMBULATORY_CARE_PROVIDER_SITE_OTHER): Admitting: Family Medicine

## 2024-04-08 ENCOUNTER — Encounter: Payer: Self-pay | Admitting: Family Medicine

## 2024-04-08 VITALS — BP 144/66 | HR 70 | Temp 97.8°F | Wt 125.6 lb

## 2024-04-08 DIAGNOSIS — M545 Low back pain, unspecified: Secondary | ICD-10-CM | POA: Diagnosis not present

## 2024-04-08 DIAGNOSIS — G8929 Other chronic pain: Secondary | ICD-10-CM

## 2024-04-08 DIAGNOSIS — R413 Other amnesia: Secondary | ICD-10-CM | POA: Diagnosis not present

## 2024-04-08 DIAGNOSIS — I10 Essential (primary) hypertension: Secondary | ICD-10-CM

## 2024-04-08 MED ORDER — OXYCODONE HCL ER 10 MG PO T12A: 10.0000 mg | EXTENDED_RELEASE_TABLET | Freq: Two times a day (BID) | ORAL | 0 refills | Status: DC

## 2024-04-08 NOTE — Progress Notes (Unsigned)
 Established Patient Office Visit  Subjective   Patient ID: [name], female    DOB: 01/07/1941  Age: 83 y.o. MRN: [id number]  Chief Complaint  Patient presents with   Medical Management of Chronic Issues    HPI  {History (Optional):23778} [name] is seen today for chronic medical follow-up.  She has history of hypertension, temporal arteritis, GERD, rheumatoid arthritis, osteoporosis, remote history of breast cancer, history of CVA, hyperlipidemia, chronic back pain.  Staying busy caring for her sister in [place] who has Alzheimer's disease.  Patient has noted some short-term memory loss for quite some time.  She saw neurologist recently and had nuclear medicine PET brain amyloid scan ordered as well as blood test screening for amyloidosis and Alzheimer's risk. Her screening revealed increased risk for Alzheimer's disease.  Very strong family history in her father and sister of Alzheimer's dementia.  She apparently scored 20/30 on recent MMSE.  She had recent B12 which was normal.  She remains on amlodipine  for hypertension and also takes Lasix  40 mg daily and potassium.  She has been on OxyContin  10 mg twice daily for many years.  No recent major constipation issues.  She gets her prescription through [facility] and is due for refills now  Denies any recent falls.  Appetite and weight stable.  Past Medical History:  Diagnosis Date   Allergic rhinitis    Anemia    Anxiety    Bronchiectasis    Carpal tunnel syndrome 07/13/2015   Bilateral   CVA (cerebral infarction) 10/2007   Right thalamic    Diverticulosis of colon    Gait disorder    GERD (gastroesophageal reflux disease)    History of breast cancer    HTN (hypertension)    Hyperlipidemia    Idiopathic pulmonary fibrosis    Left knee DJD    Migraine    Osteoporosis    Peripheral neuropathy    PMR (polymyalgia rheumatica) (HCC)    Polyneuropathy in other diseases classified elsewhere (HCC) 02/17/2013    Previous back surgery 10/09/12   Renal insufficiency    Rheumatoid arthritis (HCC)    Seizure disorder (HCC)    Spondylosis, cervical, with myelopathy 08/31/2015   C3-4 myelopathy   Temporal arteritis (HCC)    Right eye blind, 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   Type II or unspecified type diabetes mellitus without mention of complication, not stated as uncontrolled    2nd to steriods   Vitamin D deficiency    Past Surgical History:  Procedure Laterality Date   APPENDECTOMY  01/2021   CARPAL TUNNEL RELEASE Right    CATARACT EXTRACTION     OS - Summer 2010   CERVICAL FUSION  09/24/2008   4 rods and pins in place   CHOLECYSTECTOMY     COLONOSCOPY  12/15/2011   Procedure: COLONOSCOPY;  Surgeon: Renaye Sous, MD;  Location: [facility];  Service: Endoscopy;  Laterality: N/A;   LUMBAR FUSION  04/24/2010   W/Mechanical fixation - L2-5 [facility]   LUMBAR FUSION  09/25/2011   rods in hips (to stabilize).   MASTECTOMY     NECK SURGERY     rheumatoid nodule removal     SPINAL FUSION  07/25/2010   T10-L2 interbody fusion / [facility]   TONSILLECTOMY     TOTAL KNEE ARTHROPLASTY     Left    reports that she has never smoked. She has never used smokeless tobacco. She reports that she does not currently use  alcohol. She reports that she does not use drugs. family history includes Arthritis in her father and mother; Brain cancer in her mother; Breast cancer in her sister; Dementia in her father; Hypertension in her father and mother; Lung cancer in her sister. Allergies  Allergen Reactions   Atorvastatin  Nausea Only and Other (See Comments)    Dizzyness.    Hydromorphone Other (See Comments)    Cognitive changes  made me unconscious Dilaudid      Review of Systems  Constitutional:  Negative for chills, fever and malaise/fatigue.  Eyes:  Negative for blurred vision.  Respiratory:  Negative for shortness of breath.   Cardiovascular:  Negative for chest pain.   Neurological:  Negative for dizziness, weakness and headaches.      Objective:     BP (!) 144/66 (BP Location: Left Arm, Patient Position: Sitting, Cuff Size: Normal)   Pulse 70   Temp 97.8 F (36.6 C) (Oral)   Wt 125 lb 9.6 oz (57 kg)   SpO2 97%   BMI 23.73 kg/m  BP Readings from Last 3 Encounters:  04/08/24 (!) 144/66  02/25/24 116/70  02/14/24 130/62   Wt Readings from Last 3 Encounters:  04/08/24 125 lb 9.6 oz (57 kg)  02/25/24 124 lb (56.2 kg)  02/14/24 124 lb 9.6 oz (56.5 kg)      Physical Exam Vitals reviewed.  Constitutional:      Appearance: She is well-developed.  Eyes:     Pupils: Pupils are equal, round, and reactive to light.  Neck:     Thyroid : No thyromegaly.     Vascular: No JVD.  Cardiovascular:     Rate and Rhythm: Normal rate and regular rhythm.     Heart sounds:     No gallop.  Pulmonary:     Effort: Pulmonary effort is normal. No respiratory distress.     Breath sounds: Normal breath sounds. No wheezing or rales.  Musculoskeletal:     Cervical back: Neck supple.     Right lower leg: No edema.     Left lower leg: No edema.  Neurological:     Mental Status: She is alert.      No results found for any visits on 04/08/24.  Last CBC Lab Results  Component Value Date   WBC 6.1 11/19/2023   HGB 12.7 11/19/2023   HCT 39.1 11/19/2023   MCV 100 (H) 11/19/2023   MCH 32.5 11/19/2023   RDW 12.6 11/19/2023   PLT 266 11/19/2023   Last metabolic panel Lab Results  Component Value Date   GLUCOSE 96 11/19/2023   NA 142 11/19/2023   K 3.9 11/19/2023   CL 100 11/19/2023   CO2 27 11/19/2023   BUN 18 11/19/2023   CREATININE 1.46 (H) 11/19/2023   EGFR 40.0 01/22/2024   CALCIUM  9.6 11/19/2023   PHOS 2.0 (L) 08/02/2022   PROT 6.8 11/19/2023   ALBUMIN 4.1 11/19/2023   LABGLOB 2.7 11/19/2023   BILITOT 1.2 11/19/2023   ALKPHOS 80 11/19/2023   AST 23 11/19/2023   ALT 11 11/19/2023   ANIONGAP 12 08/02/2022   Last lipids Lab Results   Component Value Date   CHOL 143 08/03/2022   HDL 47 08/03/2022   LDLCALC 85 08/03/2022   TRIG 56 08/03/2022   CHOLHDL 3.0 08/03/2022   Last hemoglobin A1c Lab Results  Component Value Date   HGBA1C 5.0 08/03/2022   Last thyroid  functions Lab Results  Component Value Date   TSH 2.490 11/19/2023  Last vitamin B12 and Folate Lab Results  Component Value Date   VITAMINB12 >2000 (H) 02/25/2024   FOLATE  11/03/2007    10.0 (NOTE)  Reference Ranges        Deficient:       0.4 - 3.4 ng/mL        Indeterminate:   3.4 - 5.4 ng/mL        Normal:              > 5.4 ng/mL      The ASCVD Risk score (Arnett DK, et al., 2019) failed to calculate for the following reasons:   The 2019 ASCVD risk score is only valid for ages 21 to 40    Assessment & Plan:   #1 chronic back pain.  Stable on OxyContin  10 mg twice daily.  Pain control is good with current level of medication.  No history of misuse.  We have talked about safety issues with regard to administering this especially she is having some memory decline.  She is currently using a pillbox and highly advocate that.  Refill medication for 3 months and set up 54-month follow-up  #2 hypertension.  Stable on amlodipine .  Continue low-sodium diet.  #3 memory loss.  Recent screening per neurology addressed with patient and reviewed.  She had markers for increased risk for Alzheimer's but not amyloidosis.  She is encouraged to continue close follow-up with neurology. Return in about 3 months (around 07/09/2024).    Wolm Scarlet, MD

## 2024-04-13 ENCOUNTER — Other Ambulatory Visit: Payer: Self-pay | Admitting: Family Medicine

## 2024-05-01 ENCOUNTER — Telehealth: Payer: Self-pay

## 2024-05-01 NOTE — Telephone Encounter (Signed)
 Copied from CRM [id number]. Topic: General - Other >> May 01, 2024  7:37 AM Pinkey ORN wrote: Reason for CRM: Tetanus Shot >> May 01, 2024  7:38 AM Pinkey ORN wrote: Patient called in wanting to know when was her last tetanus shot, states it's urgent that she knows this information. Patient call back number [phone]

## 2024-05-01 NOTE — Telephone Encounter (Signed)
 I spoke with the patient and informed her of last TDAP vaccine was given 10/19/2023. Patient reported she was scratched by a dog x5 days ago and inquired if she would need any vaccinations for this?

## 2024-05-01 NOTE — Telephone Encounter (Signed)
 Patient informed of the message below and voiced understanding

## 2024-05-07 ENCOUNTER — Telehealth: Payer: Self-pay | Admitting: *Deleted

## 2024-05-07 NOTE — Telephone Encounter (Signed)
 Patient was provided clarification on her current prescriptions. Patient advised to reach out to pharmacy for further clarification.

## 2024-05-07 NOTE — Telephone Encounter (Signed)
 Copied from CRM [id number]. Topic: General - Other >> May 07, 2024 10:42 AM Rosina BIRCH wrote: Reason for CRM: patient called stating she messed up her medications and need someone to call her to get it straight  Cb [phone]

## 2024-05-07 NOTE — Telephone Encounter (Signed)
 Patient is calling back to get a update on recent questions on medication. You can call cell phone at [phone]

## 2024-05-20 ENCOUNTER — Encounter (HOSPITAL_BASED_OUTPATIENT_CLINIC_OR_DEPARTMENT_OTHER): Payer: Self-pay

## 2024-05-20 ENCOUNTER — Other Ambulatory Visit: Payer: Self-pay

## 2024-05-20 ENCOUNTER — Emergency Department (HOSPITAL_BASED_OUTPATIENT_CLINIC_OR_DEPARTMENT_OTHER)
Admission: EM | Admit: 2024-05-20 | Discharge: 2024-05-20 | Disposition: A | Discharge status: Home / Self Care | Attending: Emergency Medicine | Admitting: Emergency Medicine

## 2024-05-20 ENCOUNTER — Emergency Department (HOSPITAL_BASED_OUTPATIENT_CLINIC_OR_DEPARTMENT_OTHER)

## 2024-05-20 ENCOUNTER — Ambulatory Visit: Payer: Self-pay | Admitting: Family Medicine

## 2024-05-20 DIAGNOSIS — E119 Type 2 diabetes mellitus without complications: Secondary | ICD-10-CM | POA: Diagnosis not present

## 2024-05-20 DIAGNOSIS — R600 Localized edema: Secondary | ICD-10-CM | POA: Diagnosis not present

## 2024-05-20 DIAGNOSIS — S81852D Open bite, left lower leg, subsequent encounter: Secondary | ICD-10-CM | POA: Insufficient documentation

## 2024-05-20 DIAGNOSIS — Z8673 Personal history of transient ischemic attack (TIA), and cerebral infarction without residual deficits: Secondary | ICD-10-CM | POA: Diagnosis not present

## 2024-05-20 DIAGNOSIS — S41151D Open bite of right upper arm, subsequent encounter: Secondary | ICD-10-CM | POA: Insufficient documentation

## 2024-05-20 DIAGNOSIS — S41152D Open bite of left upper arm, subsequent encounter: Secondary | ICD-10-CM | POA: Insufficient documentation

## 2024-05-20 DIAGNOSIS — S81851D Open bite, right lower leg, subsequent encounter: Secondary | ICD-10-CM | POA: Insufficient documentation

## 2024-05-20 DIAGNOSIS — I700 Atherosclerosis of aorta: Secondary | ICD-10-CM | POA: Diagnosis not present

## 2024-05-20 DIAGNOSIS — J42 Unspecified chronic bronchitis: Secondary | ICD-10-CM | POA: Diagnosis not present

## 2024-05-20 DIAGNOSIS — R0602 Shortness of breath: Secondary | ICD-10-CM | POA: Diagnosis not present

## 2024-05-20 DIAGNOSIS — W540XXA Bitten by dog, initial encounter: Secondary | ICD-10-CM

## 2024-05-20 DIAGNOSIS — W540XXD Bitten by dog, subsequent encounter: Secondary | ICD-10-CM | POA: Insufficient documentation

## 2024-05-20 DIAGNOSIS — M7989 Other specified soft tissue disorders: Secondary | ICD-10-CM | POA: Diagnosis present

## 2024-05-20 LAB — BASIC METABOLIC PANEL WITH GFR
Anion gap: 15 (ref 5–15)
BUN: 14 mg/dL (ref 8–23)
CO2: 25 mmol/L (ref 22–32)
Calcium: 10 mg/dL (ref 8.9–10.3)
Chloride: 102 mmol/L (ref 98–111)
Creatinine, Ser: 1.28 mg/dL — ABNORMAL HIGH (ref 0.44–1.00)
GFR, Estimated: 41 mL/min — ABNORMAL LOW (ref >60)
Glucose, Bld: 95 mg/dL (ref 70–99)
Potassium: 4 mmol/L (ref 3.5–5.1)
Sodium: 142 mmol/L (ref 135–145)

## 2024-05-20 LAB — CBC
HCT: 43.1 % (ref 36.0–46.0)
Hemoglobin: 13.8 g/dL (ref 12.0–15.0)
MCH: 31.1 pg (ref 26.0–34.0)
MCHC: 32 g/dL (ref 30.0–36.0)
MCV: 97.1 fL (ref 80.0–100.0)
Platelets: 249 K/uL (ref 150–400)
RBC: 4.44 MIL/uL (ref 3.87–5.11)
RDW: 14.4 % (ref 11.5–15.5)
WBC: 10.7 K/uL — ABNORMAL HIGH (ref 4.0–10.5)
nRBC: 0 % (ref 0.0–0.2)

## 2024-05-20 LAB — TROPONIN T, HIGH SENSITIVITY
Troponin T High Sensitivity: 33 ng/L — ABNORMAL HIGH (ref 0–19)
Troponin T High Sensitivity: 28 ng/L — ABNORMAL HIGH (ref 0–19)

## 2024-05-20 LAB — PRO BRAIN NATRIURETIC PEPTIDE: Pro Brain Natriuretic Peptide: 599 pg/mL — ABNORMAL HIGH (ref <300.0)

## 2024-05-20 MED ORDER — FUROSEMIDE 10 MG/ML IJ SOLN
40.0000 mg | Freq: Once | INTRAMUSCULAR | Status: AC
  Administered 2024-05-20: 40 mg via INTRAVENOUS
  Filled 2024-05-20: qty 4

## 2024-05-20 NOTE — ED Notes (Signed)
 Ambulatory to restroom

## 2024-05-20 NOTE — Telephone Encounter (Signed)
 FYI Only or Action Required?: FYI only for provider.  Patient was last seen in primary care on 04/08/2024 by Micheal Wolm ORN, MD.  Called Nurse Triage reporting Leg Swelling.  Symptoms began a week ago.  Interventions attempted: Nothing.  Symptoms are: unchanged.  Triage Disposition: Go to ED Now (Notify PCP)  Patient/caregiver understands and will follow disposition?: Yes              Copied from CRM [id number]. Topic: Clinical - Red Word Triage >> May 20, 2024  8:35 AM Pinkey ORN wrote: Red Word that prompted transfer to Nurse Triage: Swelling >> May 20, 2024  8:36 AM Pinkey ORN wrote: Patient states she's experiencing some swelling in her legs.  Reason for Disposition  Difficulty breathing at rest  Answer Assessment - Initial Assessment Questions ONSET: When did the swelling start? (e.g., minutes, hours, days)     One week ago  LOCATION: What part of the leg is swollen?  Are both legs swollen or just one leg?     Below knee  SEVERITY: How bad is the swelling? (e.g., localized; mild, moderate, severe)     It's in between when offered mild, moderate, or severe; pants are tighter  REDNESS: Is there redness or signs of infection?     It's hard to tell pt states she has had scratches and cuts over the past few years; more red than normal  PAIN: Is the swelling painful to touch? If Yes, ask: How painful is it?   (Scale 1-10; mild, moderate or severe)     Sore with touch  FEVER: Do you have a fever? If Yes, ask: What is it, how was it measured, and when did it start?      No  OTHER SYMPTOMS: Do you have any other symptoms? (e.g., chest pain, difficulty breathing)       Difficulty breathing; pt states she is not sure if its because she is nervous about this  Pt states her niece's little dog jumped up on her about one week ago and pulled the skin on her legs and arms; denies scratches  Pt states the same week she was at a restaurant  and a door hit her and she cut her right leg; now scabbed over; pt kept neosporin on it Pt states her legs are swelling and perspiring.  Denies warm to touch this morning; they have  Protocols used: Leg Swelling and Edema-A-AH

## 2024-05-20 NOTE — ED Provider Notes (Signed)
 [place] EMERGENCY DEPARTMENT AT [facility] Provider Note   CSN: [id number] Arrival date & time: 05/20/24  1059     Patient presents with: Leg Swelling   [name] is a 83 y.o. female.   Patient here with leg swelling shortness of breath.  No chest pain.  History of idiopathic pulmonary fibrosis, bronchiectasis, stroke, diabetes takes diuretics.  She has noticed leg swelling to both legs and some shortness of breath recently.  She also had some dog bites on her arms and legs recently but treated that with Neosporin and bandages.  Seems like those are healing well.  Her main concern today is her leg swelling and shortness of breath.  The history is provided by the patient.       Prior to Admission medications   Medication Sig Start Date End Date Taking? Authorizing Provider  amLODipine  (NORVASC ) 5 MG tablet TAKE 1 TABLET DAILY 04/13/24   Burchette, Wolm LELON, MD  Calcium  Carb-Cholecalciferol  (CALCIUM  CARBONATE-VITAMIN D3 PO) Take 1 tablet by mouth daily    [provider]  cephALEXin  (KEFLEX ) 500 MG capsule Take 1 capsule (500 mg total) by mouth 3 (three) times daily. 02/14/24   Burchette, Wolm LELON, MD  cholecalciferol  (VITAMIN D) 1000 UNITS tablet Take 1,000 Units by mouth daily.    [provider]  denosumab  (PROLIA ) 60 MG/ML SOLN injection Inject 60 mg into the skin every 6 (six) months.    [provider]  docusate sodium  (COLACE) 100 MG capsule Take 100 mg by mouth 2 (two) times daily.    [provider]  furosemide  (LASIX ) 40 MG tablet TAKE 1 TABLET DAILY 03/09/24   Burchette, Wolm LELON, MD  gabapentin  (NEURONTIN ) 300 MG capsule TAKE 1 CAPSULE TWICE A DAY 01/24/24   Burchette, Wolm LELON, MD  Golimumab  (SIMPONI  ARIA IV) Inject into the vein. Takes infusion every 8 wks.    [provider]  Multiple Vitamins-Minerals (MULTIVITAMIN,TX-MINERALS) tablet Take 1 tablet by mouth daily.    [provider]  Omega-3 Fatty Acids  (FISH OIL OMEGA-3 PO) Take 1 capsule by mouth daily.    [provider]  oxyCODONE  (OXYCONTIN ) 10 mg 12 hr tablet Take 1 tablet (10 mg total) by mouth every 12 (twelve) hours. 04/08/24   Burchette, Wolm LELON, MD  Oxycodone  HCl 10 MG TABS Take 1 tablet (10 mg total) by mouth every 6 (six) hours as needed. Take 1 tablet by mouth every 6 hours as needed for breakthrough pain 09/11/23   Micheal Wolm LELON, MD  potassium chloride  (KLOR-CON  M) 10 MEQ tablet TAKE 1 TABLET TWICE A DAY 02/18/24   Burchette, Wolm LELON, MD  predniSONE  (DELTASONE ) 1 MG tablet TAKE 4 TABLETS DAILY WITH BREAKFAST 07/04/23   Gayland Lauraine PARAS, NP  vitamin B-12 (CYANOCOBALAMIN ) 100 MCG tablet Take 100 mcg by mouth daily.    [provider]    Allergies: Atorvastatin  and Hydromorphone    Review of Systems  Updated Vital Signs BP 117/67   Pulse 67   Temp 98 F (36.7 C) (Oral)   Resp 14   SpO2 98%   Physical Exam Vitals and nursing note reviewed.  Constitutional:      General: She is not in acute distress.    Appearance: She is well-developed. She is not ill-appearing.  HENT:     Head: Normocephalic and atraumatic.     Nose: Nose normal.     Mouth/Throat:     Mouth: Mucous membranes are moist.  Eyes:  Extraocular Movements: Extraocular movements intact.     Conjunctiva/sclera: Conjunctivae normal.     Pupils: Pupils are equal, round, and reactive to light.  Cardiovascular:     Rate and Rhythm: Normal rate and regular rhythm.     Heart sounds: No murmur heard. Pulmonary:     Effort: Pulmonary effort is normal. No respiratory distress.     Breath sounds: Normal breath sounds.  Abdominal:     Palpations: Abdomen is soft.     Tenderness: There is no abdominal tenderness.  Musculoskeletal:        General: No swelling.     Cervical back: Normal range of motion and neck supple.     Right lower leg: Edema present.     Left lower leg: Edema present.     Comments: Trace edema bilaterally in her legs   Skin:    General: Skin is warm and dry.     Capillary Refill: Capillary refill takes less than 2 seconds.     Comments: She has got old healing puncture wounds to her arms and legs but there is no surrounding cellulitis  Neurological:     General: No focal deficit present.     Mental Status: She is alert and oriented to person, place, and time.     Cranial Nerves: No cranial nerve deficit.     Sensory: No sensory deficit.     Motor: No weakness.     Coordination: Coordination normal.  Psychiatric:        Mood and Affect: Mood normal.     (all labs ordered are listed, but only abnormal results are displayed) Labs Reviewed  BASIC METABOLIC PANEL WITH GFR - Abnormal; Notable for the following components:      Result Value   Creatinine, Ser 1.28 (*)    GFR, Estimated 41 (*)    All other components within normal limits  CBC - Abnormal; Notable for the following components:   WBC 10.7 (*)    All other components within normal limits  PRO BRAIN NATRIURETIC PEPTIDE - Abnormal; Notable for the following components:   Pro Brain Natriuretic Peptide 599.0 (*)    All other components within normal limits  TROPONIN T, HIGH SENSITIVITY - Abnormal; Notable for the following components:   Troponin T High Sensitivity 33 (*)    All other components within normal limits  TROPONIN T, HIGH SENSITIVITY - Abnormal; Notable for the following components:   Troponin T High Sensitivity 28 (*)    All other components within normal limits    EKG: EKG Interpretation Date/Time:  Wednesday May 20 2024 11:23:06 EDT Ventricular Rate:  76 PR Interval:  174 QRS Duration:  79 QT Interval:  381 QTC Calculation: 429 R Axis:   -15  Text Interpretation: Sinus rhythm Atrial premature complex Confirmed by Ruthe Cornet [phone]) on 05/20/2024 11:58:28 AM  Radiology: [facility] Chest Portable 1 View Result Date: 05/20/2024 CLINICAL DATA:  Short of breath EXAM: PORTABLE CHEST 1 VIEW COMPARISON:  Prior chest x-ray  08-10-40 FINDINGS: Relatively increased density overlying the left chest consistent with known peripherally calcified breast reconstruction prosthesis. Cardiac and mediastinal contours are within normal limits. Atherosclerotic calcifications present in the transverse aorta. Extensive chronic bronchitic changes. No overt pulmonary edema, pleural effusion or pneumothorax. Evidence of extensive prior cervical and thoracolumbar spine surgery. Surgical clips in the right upper quadrant consistent with prior cholecystectomy. IMPRESSION: 1. No acute cardiopulmonary process. 2. Stable chronic bronchitic changes. Electronically Signed   By: Wilkie Karalee HERO.D.  On: 05/20/2024 12:25     Procedures   Medications Ordered in the ED  furosemide  (LASIX ) injection 40 mg (40 mg Intravenous Given 05/20/24 1242)                                    Medical Decision Making Amount and/or Complexity of Data Reviewed Labs: ordered. Radiology: ordered.  Risk Prescription drug management.   SHEVAWN LANGENBERG is here with leg swelling shortness of breath.  Normal vitals but no fever.  History of volume overload on diuretics history of renal failure, pulmonary fibrosis CVA PmR.  Overall she looks like she has edema in her legs.  Clear breath sounds.  Does not have any major respiratory symptoms.  Normal vitals.  She also mentions dog bite here recently those look well-appearing on her exam.  She has 1 to her right arm and to her right leg without any surrounding infectious process.  She not having any chest pain.  EKG shows sinus rhythm.  No ischemic changes.  Differential diagnosis likely volume overload or venous stasis/peripheral edema.  Will get chest x-ray troponin BNP basic labs.  I have no concern for PE dissection.  Seems less likely to be infectious process/pneumonia.  Troponin is 33 likely secondary to CKD will trend.  Creatinine at baseline at 1.28.  Pro BNP is 599 which age-adjusted is expected/normal.   Seems less likely to be major heart failure.  Chest x-ray shows no obvious volume overload or pleural effusions.  She does look like she has some peripheral edema however we will give her an additional dose of Lasix  here in the ED.  Will trend troponin.  But this does not appear to be an infectious process or any other acute process.  Will trend troponin.  If stable anticipate discharge to home.  Overall repeat troponin stable.  Continue management of her dog wounds as she has been at home.  Her shots are up-to-date.  Dog shots are up-to-date.  These bites occurred over a week ago.  I do not think she needs any antibiotics at this time as wounds are pretty much healed and she has some chronic scabs now.  The think she has got some chronic venous stasis in her legs.  Continue her Lasix  at home.  Follow-up with primary care.  Discharge.  I have no concern for ACS PE heart failure otherwise.  This chart was dictated using voice recognition software.  Despite best efforts to proofread,  errors can occur which can change the documentation meaning.      Final diagnoses:  Peripheral edema  Dog bite, initial encounter    ED Discharge Orders     None          Ruthe Cornet, DO 05/20/24 1338

## 2024-05-20 NOTE — ED Notes (Signed)
 Reviewed AVS/discharge instruction with patient. Time allotted for and all questions answered. Patient is agreeable for d/c and escorted to ed exit by staff.

## 2024-05-20 NOTE — ED Notes (Signed)
 Commode at bedside, call bell in reach. Patient instructed to call for assistance.

## 2024-05-20 NOTE — ED Triage Notes (Signed)
 Patient reports dog bites to arms and legs that she has been treating with neosporin and bandages. However, she reports some headaches, shortness of breath, and leg swelling with drainage.

## 2024-05-20 NOTE — Discharge Instructions (Addendum)
 Continue taking your medications as prescribed.  Continue your wound care management but at this time I think your wounds are healing very well.  You can use Neosporin or bacitracin  ointment twice daily for the next 2-3 more days.

## 2024-05-21 ENCOUNTER — Telehealth: Payer: Self-pay | Admitting: *Deleted

## 2024-05-21 ENCOUNTER — Ambulatory Visit: Payer: Self-pay

## 2024-05-21 ENCOUNTER — Ambulatory Visit (INDEPENDENT_AMBULATORY_CARE_PROVIDER_SITE_OTHER): Admitting: Internal Medicine

## 2024-05-21 ENCOUNTER — Encounter: Payer: Self-pay | Admitting: Internal Medicine

## 2024-05-21 VITALS — BP 130/88 | Temp 98.0°F | Wt 122.1 lb

## 2024-05-21 DIAGNOSIS — R0789 Other chest pain: Secondary | ICD-10-CM | POA: Diagnosis not present

## 2024-05-21 DIAGNOSIS — W19XXXA Unspecified fall, initial encounter: Secondary | ICD-10-CM

## 2024-05-21 MED ORDER — MELOXICAM 7.5 MG PO TABS: 7.5000 mg | ORAL_TABLET | Freq: Every day | ORAL | 0 refills | Status: DC

## 2024-05-21 NOTE — Telephone Encounter (Signed)
 Answered in phone message 05/21/24

## 2024-05-21 NOTE — Telephone Encounter (Signed)
 FYI Only or Action Required?: Action required by provider: clinical question for provider.   Copied from CRM #[id number]. Topic: Clinical - Medical Advice >> May 21, 2024 12:02 PM [name] wrote: Reason for CRM: Patient would like a call back from the provider to discuss why she was prescribed meloxicam  (MOBIC ) 7.5 MG tablet . Patient is concern with all the side effects associated with medication   CB#[phone]

## 2024-05-21 NOTE — Telephone Encounter (Signed)
 Copied from CRM [id number]. Topic: Clinical - Prescription Issue >> May 21, 2024  4:35 PM Taleah C wrote: Reason for CRM: pt called and stated that she can't take the rx, Meloxicam , because she believes that her other meds may interfere with the Meloxicam . Please call back at [phone].

## 2024-05-21 NOTE — Telephone Encounter (Signed)
 FYI Only or Action Required?: FYI only for provider.  Patient was last seen in primary care on 04/08/2024 by Micheal Wolm ORN, MD.  Called Nurse Triage reporting Fall.  Symptoms began yesterday.  Interventions attempted: Nothing.  Symptoms are: unchanged.  Triage Disposition: See HCP Within 4 Hours (Or PCP Triage)  Patient/caregiver understands and will follow disposition?: Yes   Copied from CRM [id number]. Topic: Clinical - Red Word Triage >> May 21, 2024  7:36 AM Larissa RAMAN wrote: [facility] that prompted transfer to Nurse Triage: [place] on yesterday. Pain on right side Reason for Disposition  [1] MODERATE weakness (e.g., interferes with work, school, normal activities) AND [2] new-onset or getting worse  Answer Assessment - Initial Assessment Questions 1. MECHANISM: How did the fall happen?     Yesterday was walking to pick up somethings and fell into a bench and hit right upper rib cage. 2. DOMESTIC VIOLENCE AND ELDER ABUSE SCREENING: Did you fall because someone pushed you or tried to hurt you? If Yes, ask: Are you safe now?     no 3. ONSET: When did the fall happen? (e.g., minutes, hours, or days ago)     yesterday 4. LOCATION: What part of the body hit the ground? (e.g., back, buttocks, head, hips, knees, hands, head, stomach)     Right ribs 5. INJURY: Did you hurt (injure) yourself when you fell? If Yes, ask: What did you injure? Tell me more about this? (e.g., body area; type of injury; pain severity)     Fall, rib cage side and towards back 6. PAIN: Is there any pain? If Yes, ask: How bad is the pain? (e.g., Scale 0-10; or none, mild,      moderate 7. SIZE: For cuts, bruises, or swelling, ask: How large is it? (e.g., inches or centimeters)      no 8. PREGNANCY: Is there any chance you are pregnant? When was your last menstrual period?     na 9. OTHER SYMPTOMS: Do you have any other symptoms? (e.g., dizziness, fever, weakness; new-onset or  worsening).      no 10. CAUSE: What do you think caused the fall (or falling)? (e.g., dizzy spell, tripped)       tripped  Protocols used: Falls and [facility]

## 2024-05-21 NOTE — Telephone Encounter (Signed)
 Patient is aware that Dr Theophilus said [facility] to take for the 10 day prescribed course.  She would like to know if Dr Micheal agrees?

## 2024-05-21 NOTE — Progress Notes (Signed)
 Established Patient Office Visit     CC/Reason for Visit: Fall with right side and rib cage pain  HPI: [name] is a 83 y.o. female who is coming in today for the above mentioned reasons.  She tripped over her feet next to her bed and fell on the arm of the bench injuring her right rib cage.  It is a little tender to touch.  No difficulty taking deep breaths, some pain to palpation.   Past Medical/Surgical History: Past Medical History:  Diagnosis Date   Allergic rhinitis    Anemia    Anxiety    Bronchiectasis    Carpal tunnel syndrome 07/13/2015   Bilateral   CVA (cerebral infarction) 10/2007   Right thalamic    Diverticulosis of colon    Gait disorder    GERD (gastroesophageal reflux disease)    History of breast cancer    HTN (hypertension)    Hyperlipidemia    Idiopathic pulmonary fibrosis    Left knee DJD    Migraine    Osteoporosis    Peripheral neuropathy    PMR (polymyalgia rheumatica) (HCC)    Polyneuropathy in other diseases classified elsewhere (HCC) 02/17/2013   Previous back surgery 10/09/12   Renal insufficiency    Rheumatoid arthritis (HCC)    Seizure disorder (HCC)    Spondylosis, cervical, with myelopathy 08/31/2015   C3-4 myelopathy   Temporal arteritis (HCC)    Right eye blind, 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   Type II or unspecified type diabetes mellitus without mention of complication, not stated as uncontrolled    2nd to steriods   Vitamin D deficiency     Past Surgical History:  Procedure Laterality Date   APPENDECTOMY  01/2021   CARPAL TUNNEL RELEASE Right    CATARACT EXTRACTION     OS - Summer 2010   CERVICAL FUSION  09/24/2008   4 rods and pins in place   CHOLECYSTECTOMY     COLONOSCOPY  12/15/2011   Procedure: COLONOSCOPY;  Surgeon: Renaye Sous, MD;  Location: [facility];  Service: Endoscopy;  Laterality: N/A;   LUMBAR FUSION  04/24/2010   W/Mechanical fixation - L2-5 [facility]   LUMBAR FUSION  09/25/2011    rods in hips (to stabilize).   MASTECTOMY     NECK SURGERY     rheumatoid nodule removal     SPINAL FUSION  07/25/2010   T10-L2 interbody fusion / [facility]   TONSILLECTOMY     TOTAL KNEE ARTHROPLASTY     Left    Social History:  reports that she has never smoked. She has never used smokeless tobacco. She reports that she does not currently use alcohol. She reports that she does not use drugs.  Allergies: Allergies  Allergen Reactions   Atorvastatin  Nausea Only and Other (See Comments)    Dizzyness.    Hydromorphone Other (See Comments)    Cognitive changes  made me unconscious Dilaudid    Family History:  Family History  Problem Relation Age of Onset   Brain cancer Mother    Hypertension Mother    Arthritis Mother    Dementia Father    Hypertension Father    Arthritis Father    Breast cancer Sister    Lung cancer Sister    Lung disease Neg Hx    Rheumatologic disease Neg Hx      Current Outpatient Medications:    amLODipine  (NORVASC ) 5 MG tablet, TAKE 1 TABLET DAILY,  Disp: 90 tablet, Rfl: 3   Calcium  Carb-Cholecalciferol  (CALCIUM  CARBONATE-VITAMIN D3 PO), Take 1 tablet by mouth daily, Disp: , Rfl:    cephALEXin  (KEFLEX ) 500 MG capsule, Take 1 capsule (500 mg total) by mouth 3 (three) times daily., Disp: 15 capsule, Rfl: 0   cholecalciferol  (VITAMIN D) 1000 UNITS tablet, Take 1,000 Units by mouth daily., Disp: , Rfl:    denosumab  (PROLIA ) 60 MG/ML SOLN injection, Inject 60 mg into the skin every 6 (six) months., Disp: , Rfl:    docusate sodium  (COLACE) 100 MG capsule, Take 100 mg by mouth 2 (two) times daily., Disp: , Rfl:    furosemide  (LASIX ) 40 MG tablet, TAKE 1 TABLET DAILY, Disp: 90 tablet, Rfl: 1   gabapentin  (NEURONTIN ) 300 MG capsule, TAKE 1 CAPSULE TWICE A DAY, Disp: 180 capsule, Rfl: 1   Golimumab  (SIMPONI  ARIA IV), Inject into the vein. Takes infusion every 8 wks., Disp: , Rfl:    Multiple Vitamins-Minerals (MULTIVITAMIN,TX-MINERALS) tablet,  Take 1 tablet by mouth daily., Disp: , Rfl:    Omega-3 Fatty Acids (FISH OIL OMEGA-3 PO), Take 1 capsule by mouth daily., Disp: , Rfl:    oxyCODONE  (OXYCONTIN ) 10 mg 12 hr tablet, Take 1 tablet (10 mg total) by mouth every 12 (twelve) hours., Disp: 180 tablet, Rfl: 0   Oxycodone  HCl 10 MG TABS, Take 1 tablet (10 mg total) by mouth every 6 (six) hours as needed. Take 1 tablet by mouth every 6 hours as needed for breakthrough pain, Disp: 20 tablet, Rfl: 0   potassium chloride  (KLOR-CON  M) 10 MEQ tablet, TAKE 1 TABLET TWICE A DAY, Disp: 180 tablet, Rfl: 1   predniSONE  (DELTASONE ) 1 MG tablet, TAKE 4 TABLETS DAILY WITH BREAKFAST, Disp: 360 tablet, Rfl: 3   vitamin B-12 (CYANOCOBALAMIN ) 100 MCG tablet, Take 100 mcg by mouth daily., Disp: , Rfl:   Review of Systems:  Negative unless indicated in HPI.   Physical Exam: Vitals:   05/21/24 0901  BP: 130/88  Temp: 98 F (36.7 C)  TempSrc: Oral  Weight: 122 lb 1.6 oz (55.4 kg)    Body mass index is 23.07 kg/m.    Impression and Plan:  Right-sided chest wall pain  Fall, initial encounter   - I think rib fracture is unlikely, nonetheless the chest x-ray would not change my management.  Will give meloxicam  for 10 days.  She will follow-up if no significant improvement.  Time spent:22 minutes reviewing chart, interviewing and examining patient and formulating plan of care.     Tully Theophilus Andrews, MD [facility]

## 2024-05-22 NOTE — Telephone Encounter (Signed)
 Patient informed of the message below and voiced understanding

## 2024-05-27 DIAGNOSIS — Z79899 Other long term (current) drug therapy: Secondary | ICD-10-CM | POA: Diagnosis not present

## 2024-05-27 DIAGNOSIS — M0589 Other rheumatoid arthritis with rheumatoid factor of multiple sites: Secondary | ICD-10-CM | POA: Diagnosis not present

## 2024-05-28 ENCOUNTER — Ambulatory Visit (INDEPENDENT_AMBULATORY_CARE_PROVIDER_SITE_OTHER): Admitting: Podiatry

## 2024-05-28 ENCOUNTER — Encounter: Payer: Self-pay | Admitting: Podiatry

## 2024-05-28 DIAGNOSIS — B351 Tinea unguium: Secondary | ICD-10-CM | POA: Diagnosis not present

## 2024-05-28 DIAGNOSIS — G629 Polyneuropathy, unspecified: Secondary | ICD-10-CM

## 2024-05-28 DIAGNOSIS — M79674 Pain in right toe(s): Secondary | ICD-10-CM | POA: Diagnosis not present

## 2024-05-28 DIAGNOSIS — L84 Corns and callosities: Secondary | ICD-10-CM

## 2024-05-28 DIAGNOSIS — M79675 Pain in left toe(s): Secondary | ICD-10-CM | POA: Diagnosis not present

## 2024-05-28 NOTE — Progress Notes (Signed)
 Subjective:   Patient ID: [name], female   DOB: 83 y.o.   MRN: [id number]   HPI Patient presents stating she is getting lesion bottom of the left arch not a source previous and fifth metatarsal right and does have shooting burning pain in both of her feet and has elongated nails 1-5 both feet that are painful and patient cannot wear shoe gear with comfortable   ROS      Objective:  Physical Exam  Vascular status found to be intact neurologically diminishment of sharp dull vibratory noted diminishment meant of range of motion and muscle strength with patient found to have keratotic lesion Sub arch left and into the right fifth metatarsal that are both painful with elongated thickened damage nailbeds 1-5 both feet that she cannot cut     Assessment:  Neuropathy which is probably related to back issues bilateral along with chronic keratotic lesion subleft arch and right fifth metatarsal painful and mycotic nail infection with pain 1-5 both     Plan:  H&P both conditions reviewed discussed I debrided lesions bilateral no iatrogenic bleeding and I debrided nailbeds 1-5 both feet Neutra genic bleeding reappoint routine care.  She has also seen her doctor about balance issues which I think is related to neuropathy and she will continue to use walker

## 2024-05-29 ENCOUNTER — Encounter: Payer: Self-pay | Admitting: Family Medicine

## 2024-05-29 ENCOUNTER — Ambulatory Visit: Admitting: Family Medicine

## 2024-05-29 VITALS — BP 140/80 | HR 87 | Temp 98.1°F | Wt 121.9 lb

## 2024-05-29 DIAGNOSIS — R5383 Other fatigue: Secondary | ICD-10-CM

## 2024-05-29 DIAGNOSIS — R296 Repeated falls: Secondary | ICD-10-CM | POA: Diagnosis not present

## 2024-05-29 DIAGNOSIS — M545 Low back pain, unspecified: Secondary | ICD-10-CM | POA: Diagnosis not present

## 2024-05-29 DIAGNOSIS — G8929 Other chronic pain: Secondary | ICD-10-CM | POA: Diagnosis not present

## 2024-05-29 DIAGNOSIS — R350 Frequency of micturition: Secondary | ICD-10-CM | POA: Diagnosis not present

## 2024-05-29 LAB — POC URINALSYSI DIPSTICK (AUTOMATED)
Bilirubin, UA: NEGATIVE
Blood, UA: NEGATIVE
Glucose, UA: NEGATIVE
Ketones, UA: NEGATIVE
Leukocytes, UA: NEGATIVE
Nitrite, UA: NEGATIVE
Protein, UA: NEGATIVE
Spec Grav, UA: 1.015 (ref 1.010–1.025)
Urobilinogen, UA: 0.2 U/dL
pH, UA: 6 (ref 5.0–8.0)

## 2024-05-29 NOTE — Patient Instructions (Signed)
 Use walker at all times!  I will set up physical therapy referral.   Urinalysis was clear today.

## 2024-05-29 NOTE — Progress Notes (Signed)
 Established Patient Office Visit  Subjective   Patient ID: [name], female    DOB: 1941-04-14  Age: 83 y.o. MRN: [id number]  Chief Complaint  Patient presents with   Fall   Fatigue    HPI   [name] has chronic problems including hypertension, remote history of temporal arteritis, history of TIA, pulmonary fibrosis, GERD, chronic back pain, osteoporosis, rheumatoid arthritis, history of CVA, mild cognitive impairment.  She is seen today for several items as follows  Recent ER visit.  This occurred on 05/20/2024.  She presented with some increased edema in her legs.  Chest x-ray showed no evidence for volume overload.  BNP 599 which was felt to be with age adjustment relatively normal.  She denied any orthopnea.  She was given Lasix  40 mg x 1 dose and apparently edema improved.  She states edema has resolved at this time.  No recent change in diet or medications.  No history of CHF.  Recently prescribed meloxicam  but never started.  History of frequent falls.  She ambulates with walker but states she has had about 5 falls over the past couple of weeks.  With these occasions she was not using her walker.  She has had significant physical therapy in the past and would like to get set up for further therapy.  Denies any significant head injury with recent fall.  No acute worsening confusion  Urine frequency without any urine burning.  Did not have urinalysis with recent ER visit.  She is concerned about possible UTI.  No fevers or chills.  Nonspecific malaise.  She has been going periodically to help care for her sister who has dementia who lives in [place] and she feels like this may be a source of her fatigue.  Recent CBC and electrolytes in the ER were stable.  She has had recent B12 and TSH which were normal as well  Past Medical History:  Diagnosis Date   Allergic rhinitis    Anemia    Anxiety    Bronchiectasis    Carpal tunnel syndrome 07/13/2015   Bilateral   CVA  (cerebral infarction) 10/2007   Right thalamic    Diverticulosis of colon    Gait disorder    GERD (gastroesophageal reflux disease)    History of breast cancer    HTN (hypertension)    Hyperlipidemia    Idiopathic pulmonary fibrosis    Left knee DJD    Migraine    Osteoporosis    Peripheral neuropathy    PMR (polymyalgia rheumatica) (HCC)    Polyneuropathy in other diseases classified elsewhere (HCC) 02/17/2013   Previous back surgery 10/09/12   Renal insufficiency    Rheumatoid arthritis (HCC)    Seizure disorder (HCC)    Spondylosis, cervical, with myelopathy 08/31/2015   C3-4 myelopathy   Temporal arteritis (HCC)    Right eye blind, 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   Type II or unspecified type diabetes mellitus without mention of complication, not stated as uncontrolled    2nd to steriods   Vitamin D deficiency    Past Surgical History:  Procedure Laterality Date   APPENDECTOMY  01/2021   CARPAL TUNNEL RELEASE Right    CATARACT EXTRACTION     OS - Summer 2010   CERVICAL FUSION  09/24/2008   4 rods and pins in place   CHOLECYSTECTOMY     COLONOSCOPY  12/15/2011   Procedure: COLONOSCOPY;  Surgeon: Renaye Sous, MD;  Location: [facility];  Service:  Endoscopy;  Laterality: N/A;   LUMBAR FUSION  04/24/2010   W/Mechanical fixation - L2-5 [facility]   LUMBAR FUSION  09/25/2011   rods in hips (to stabilize).   MASTECTOMY     NECK SURGERY     rheumatoid nodule removal     SPINAL FUSION  07/25/2010   T10-L2 interbody fusion / [facility]   TONSILLECTOMY     TOTAL KNEE ARTHROPLASTY     Left    reports that she has never smoked. She has never used smokeless tobacco. She reports that she does not currently use alcohol. She reports that she does not use drugs. family history includes Arthritis in her father and mother; Brain cancer in her mother; Breast cancer in her sister; Dementia in her father; Hypertension in her father and mother; Lung cancer in her  sister. Allergies  Allergen Reactions   Atorvastatin  Nausea Only and Other (See Comments)    Dizzyness.    Hydromorphone Other (See Comments)    Cognitive changes  made me unconscious Dilaudid    Review of Systems  Constitutional:  Positive for malaise/fatigue.  Eyes:  Negative for blurred vision.  Respiratory:  Negative for shortness of breath.   Cardiovascular:  Negative for chest pain.  Genitourinary:  Positive for frequency. Negative for hematuria.  Neurological:  Negative for weakness and headaches.      Objective:     BP (!) 140/80   Pulse 87   Temp 98.1 F (36.7 C) (Oral)   Wt 121 lb 14.4 oz (55.3 kg)   SpO2 94%   BMI 23.03 kg/m  BP Readings from Last 3 Encounters:  05/29/24 (!) 140/80  05/21/24 130/88  05/20/24 (!) 141/64   Wt Readings from Last 3 Encounters:  05/29/24 121 lb 14.4 oz (55.3 kg)  05/21/24 122 lb 1.6 oz (55.4 kg)  04/08/24 125 lb 9.6 oz (57 kg)      Physical Exam Vitals reviewed.  Constitutional:      General: She is not in acute distress.    Appearance: She is not ill-appearing.  HENT:     Head: Normocephalic and atraumatic.  Cardiovascular:     Rate and Rhythm: Normal rate and regular rhythm.  Pulmonary:     Effort: Pulmonary effort is normal.     Comments: She has crackles in both bases consistent with her chronic pulmonary fibrosis Musculoskeletal:     Right lower leg: No edema.     Left lower leg: No edema.  Skin:    Comments: Small eschar left dorsal arm from recent injury from dog scratch.  No signs of secondary infection  Neurological:     Mental Status: She is alert.      Results for orders placed or performed in visit on 05/29/24  POCT Urinalysis Dipstick (Automated)  Result Value Ref Range   Color, UA Yellow    Clarity, UA Clear    Glucose, UA Negative Negative   Bilirubin, UA Negative    Ketones, UA Negative    Spec Grav, UA 1.015 1.010 - 1.025   Blood, UA Negative    pH, UA 6.0 5.0 - 8.0   Protein, UA  Negative Negative   Urobilinogen, UA 0.2 0.2 or 1.0 E.U./dL   Nitrite, UA Negative    Leukocytes, UA Negative Negative    Last CBC Lab Results  Component Value Date   WBC 10.7 (H) 05/20/2024   HGB 13.8 05/20/2024   HCT 43.1 05/20/2024   MCV 97.1 05/20/2024  MCH 31.1 05/20/2024   RDW 14.4 05/20/2024   PLT 249 05/20/2024   Last metabolic panel Lab Results  Component Value Date   GLUCOSE 95 05/20/2024   NA 142 05/20/2024   K 4.0 05/20/2024   CL 102 05/20/2024   CO2 25 05/20/2024   BUN 14 05/20/2024   CREATININE 1.28 (H) 05/20/2024   GFRNONAA 41 (L) 05/20/2024   CALCIUM  10.0 05/20/2024   PHOS 2.0 (L) 08/02/2022   PROT 6.8 11/19/2023   ALBUMIN 4.1 11/19/2023   LABGLOB 2.7 11/19/2023   BILITOT 1.2 11/19/2023   ALKPHOS 80 11/19/2023   AST 23 11/19/2023   ALT 11 11/19/2023   ANIONGAP 15 05/20/2024   Last lipids Lab Results  Component Value Date   CHOL 143 08/03/2022   HDL 47 08/03/2022   LDLCALC 85 08/03/2022   TRIG 56 08/03/2022   CHOLHDL 3.0 08/03/2022   Last hemoglobin A1c Lab Results  Component Value Date   HGBA1C 5.0 08/03/2022   Last thyroid  functions Lab Results  Component Value Date   TSH 2.490 11/19/2023   Last vitamin B12 and Folate Lab Results  Component Value Date   VITAMINB12 >2000 (H) 02/25/2024   FOLATE  11/03/2007    10.0 (NOTE)  Reference Ranges        Deficient:       0.4 - 3.4 ng/mL        Indeterminate:   3.4 - 5.4 ng/mL        Normal:              > 5.4 ng/mL      The ASCVD Risk score (Arnett DK, et al., 2019) failed to calculate for the following reasons:   The 2019 ASCVD risk score is only valid for ages 11 to 34    Assessment & Plan:   #1 recent bilateral lower extremity edema.  Resolved following Lasix .  Chest x-ray showed no volume overload.  ER evaluation essentially unremarkable.  No evidence for active heart failure.  She denies any dyspnea above baseline at this time.  Watch sodium intake.  She is on outpatient regimen  of 40 mg Lasix  daily and will continue with this regimen.  #2 urine frequency.  Denies any burning with urination.  Urine dipstick today is completely clear.  Reassurance given.  #3 fatigue.  Etiology unclear.  Patient relates increasing fatigue especially after her trips to eat and caring for her sister as advanced dementia.  Recent B12, TSH, CBC, chemistries unremarkable  #4 frequent falls.  Patient has had extensive physical therapy in the past.  She is requesting repeat outpatient therapy and this will be set up.  Encouraged use walker at all times.   No follow-ups on file.    Wolm Scarlet, MD

## 2024-07-03 ENCOUNTER — Other Ambulatory Visit: Payer: Self-pay | Admitting: Family Medicine

## 2024-07-08 ENCOUNTER — Encounter (HOSPITAL_BASED_OUTPATIENT_CLINIC_OR_DEPARTMENT_OTHER): Payer: Self-pay | Admitting: Physical Therapy

## 2024-07-08 ENCOUNTER — Other Ambulatory Visit: Payer: Self-pay

## 2024-07-08 ENCOUNTER — Ambulatory Visit (HOSPITAL_BASED_OUTPATIENT_CLINIC_OR_DEPARTMENT_OTHER): Attending: Family Medicine | Admitting: Physical Therapy

## 2024-07-08 DIAGNOSIS — R296 Repeated falls: Secondary | ICD-10-CM | POA: Diagnosis not present

## 2024-07-08 DIAGNOSIS — M6281 Muscle weakness (generalized): Secondary | ICD-10-CM | POA: Insufficient documentation

## 2024-07-08 DIAGNOSIS — R293 Abnormal posture: Secondary | ICD-10-CM | POA: Insufficient documentation

## 2024-07-08 DIAGNOSIS — R2681 Unsteadiness on feet: Secondary | ICD-10-CM | POA: Diagnosis not present

## 2024-07-08 DIAGNOSIS — R2689 Other abnormalities of gait and mobility: Secondary | ICD-10-CM | POA: Insufficient documentation

## 2024-07-08 DIAGNOSIS — M5459 Other low back pain: Secondary | ICD-10-CM | POA: Insufficient documentation

## 2024-07-08 NOTE — Therapy (Signed)
 OUTPATIENT PHYSICAL THERAPY LOWER EXTREMITY EVALUATION   Patient Name: [name] MRN: [id number] DOB:11-14-1940, 83 y.o., female Today's Date: 07/09/2024  END OF SESSION:  PT End of Session - 07/08/24 1324     Visit Number 1    Number of Visits 16    Date for Recertification  09/02/24    PT Start Time 1300    PT Stop Time 1344    PT Time Calculation (min) 44 min    Activity Tolerance Patient tolerated treatment well    Behavior During Therapy [facility] for tasks assessed/performed          Past Medical History:  Diagnosis Date   Allergic rhinitis    Anemia    Anxiety    Bronchiectasis    Carpal tunnel syndrome 07/13/2015   Bilateral   CVA (cerebral infarction) 10/2007   Right thalamic    Diverticulosis of colon    Gait disorder    GERD (gastroesophageal reflux disease)    History of breast cancer    HTN (hypertension)    Hyperlipidemia    Idiopathic pulmonary fibrosis    Left knee DJD    Migraine    Osteoporosis    Peripheral neuropathy    PMR (polymyalgia rheumatica)    Polyneuropathy in other diseases classified elsewhere 02/17/2013   Previous back surgery 10/09/12   Renal insufficiency    Rheumatoid arthritis (HCC)    Seizure disorder (HCC)    Spondylosis, cervical, with myelopathy 08/31/2015   C3-4 myelopathy   Temporal arteritis (HCC)    Right eye blind, 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   Type II or unspecified type diabetes mellitus without mention of complication, not stated as uncontrolled    2nd to steriods   Vitamin D deficiency    Past Surgical History:  Procedure Laterality Date   APPENDECTOMY  01/2021   CARPAL TUNNEL RELEASE Right    CATARACT EXTRACTION     OS - Summer 2010   CERVICAL FUSION  09/24/2008   4 rods and pins in place   CHOLECYSTECTOMY     COLONOSCOPY  12/15/2011   Procedure: COLONOSCOPY;  Surgeon: Renaye Sous, MD;  Location: [facility];  Service: Endoscopy;  Laterality: N/A;   LUMBAR FUSION  04/24/2010   W/Mechanical  fixation - L2-5 [facility]   LUMBAR FUSION  09/25/2011   rods in hips (to stabilize).   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 08/25/2019   Pain management 10/30/2017   Rheumatoid arthritis (HCC) 04/25/2017   S/P cervical spinal fusion 09/05/2015   Spondylosis, cervical, with myelopathy 08/31/2015   Carpal tunnel syndrome 07/13/2015   Torn ear lobe 06/07/2015   Obesity (BMI 30-39.9) 04/06/2014   Shortness of breath 07/14/2013   Bone cyst 07/14/2013   Polyneuropathy in other diseases classified elsewhere 02/17/2013   Chronic  back pain 12/09/2012   Peripheral edema 09/25/2012   Anemia due to chronic illness 09/01/2012   DDD (degenerative disc disease), lumbosacral 08/12/2012   Cervicalgia 07/11/2012   Disturbance of skin sensation 07/11/2012   Temporal arteritis (HCC) 04/01/2012   Lower GI bleed 12/24/2011   Rectal bleed 12/13/2011   Arthrodesis status 09/04/2011   Chronic renal insufficiency, stage II (mild) 04/04/2011   LUMBAR RADICULOPATHY, LEFT 04/18/2009   Essential hypertension 12/30/2007   SEIZURE DISORDER 12/30/2007   CEREBROVASCULAR ACCIDENT, HX OF 12/30/2007   Hyperlipidemia 10/08/2007   GERD 10/08/2007   DIVERTICULOSIS, COLON 10/08/2007   BRONCHIECTASIS 06/26/2007   PULMONARY FIBROSIS 05/17/2007   Osteoporosis 05/17/2007   BREAST CANCER, HX OF 05/17/2007   MASTECTOMY, LEFT, HX OF  05/17/2007    PCP: Dr Wolm Blunt   REFERRING PROVIDER: Dr Wolm Jobs   REFERRING DIAG:  Diagnosis  R29.6 (ICD-10-CM) - Frequent falls    THERAPY DIAG:  Other abnormalities of gait and mobility  Unsteadiness on feet  Abnormal posture  Muscle weakness (generalized)  Other low back pain  Rationale for Evaluation and Treatment: Rehabilitation  ONSET DATE:  Has had more falls over the past 6 months   SUBJECTIVE:   SUBJECTIVE STATEMENT: The patient has a long history of falls. The frequency has increased over the past few months. She has fallen 6x over the past 6 months . Her falls have come for a variety of reasons. She has hit her head with several of the falls. At times she feels like her legs give out. She uses a Rolator times but when she falls she is not using it at times.  She has an extensive past medical history of lumbar spine surgeries as well as PMR and RA which also likely contributed to falls.  PERTINENT HISTORY: RA, Left knee replacement, temporal arteritis, C-4-c4 fusion  Lumbar spine fusion t-10-L2, polyneuropathy PMR, migraine, history of breat cancer, gait disorder, frequent falls, anxiety, anemia, DMII,  PAIN:  Are you having pain? Yes: NPRS scale:No pain right now. Can reach a 7-8/10  Pain location: middle of the lower back  Pain description: aching  Aggravating factors: standing/ walking Relieving factors:  rest   PRECAUTIONS: Fall  RED FLAGS: None   WEIGHT BEARING RESTRICTIONS: No  FALLS:  Has patient fallen in last 6 months? Yes. Number of falls 6  LIVING ENVIRONMENT: Has a ramp into the house,  OCCUPATION:  Retired  Hobbies:  Likes to walk   PLOF: Independent  PATIENT GOALS:  To decrease the amount of falls she has  NEXT MD VISIT:    OBJECTIVE:  Note: Objective measures were completed at Evaluation unless otherwise noted.  DIAGNOSTIC FINDINGS:    PATIENT SURVEYS:  LEFS  Extreme difficulty/unable (0), Quite a  bit of difficulty (1), Moderate difficulty (2), Little difficulty (3), No difficulty (4) Survey date:    Any of your usual work, housework or school activities   2. Usual hobbies, recreational or sporting activities   3. Getting into/out of the bath   4. Walking between rooms   5. Putting on socks/shoes   6. Squatting    7. Lifting an object, like a bag of groceries from the floor   8. Performing light activities around your home   9. Performing heavy activities around your home   10. Getting into/out of a car   11. Walking 2 blocks   12. Walking 1 mile   13. Going up/down 10 stairs (1 flight)   14. Standing  for 1 hour   15.  sitting for 1 hour   16. Running on even ground   17. Running on uneven ground   18. Making sharp turns while running fast   19. Hopping    20. Rolling over in bed   Score total:  28/80     COGNITION: Overall cognitive status: Within functional limits for tasks assessed  Feels like her memory is getting a little worse    SENSATION: Numbness in bilateral feet      POSTURE: flexed trunk   PALPATION:  No significant tenderness to palpation in the back at this time    (Blank rows = not tested)  LOWER EXTREMITY MMT:  MMT Right eval Left eval  Hip flexion 16.9 16.3  Hip extension    Hip abduction 13.2 16.6  Hip adduction    Hip internal rotation    Hip external rotation    Knee flexion    Knee extension 21.9 17.8  Ankle dorsiflexion    Ankle plantarflexion    Ankle inversion    Ankle eversion     (Blank rows = not tested)   GAIT:  Gait: flexed trunk  Orthostatic B/P Seated 124/90 119/66  5x sit to stand 21.3                                                                                                                               TREATMENT DATE:   There-ex:  Seated hip abduction 2x10 red   Seated march 2x10 red  LAQ 2x10 red     PATIENT EDUCATION:  Education details: HEP, symptom management,  Person educated:  Patient Education method: Explanation, Demonstration, Tactile cues, Verbal cues, and Handouts Education comprehension: verbalized understanding, returned demonstration, verbal cues required, tactile cues required, and needs further education  HOME EXERCISE PROGRAM: Access Code: [email or web address] URL: [email or web address] Date: 07/08/2024 Prepared by: Alm Don  ASSESSMENT:  CLINICAL IMPRESSION: Patient is a 83 year old female who presents to physical therapy following frequent falls.  She has bilateral lower extremity weakness.  Has baseline deficits in static balance.  Therapy will perform more extensive balance test next visit.  She has a history of lower back pain.  Without the device she requires contact-guard assist to minimum assist to transfer.  Her most recent fall was 2 weeks ago.  Her frequent falls are limiting her overall mobility.  She would benefit from skilled therapy to improve her safety. OBJECTIVE IMPAIRMENTS: Abnormal gait, decreased activity tolerance, decreased balance, difficulty walking, decreased ROM, decreased strength, impaired sensation, and pain.   ACTIVITY LIMITATIONS: carrying, lifting, bending, standing, squatting, stairs, transfers, and locomotion level  PARTICIPATION LIMITATIONS: meal prep, cleaning, laundry, shopping, and community activity  PERSONAL FACTORS: 3+ comorbidities: RA, multple neck and back fusions  left knee replacement, PMR  are also affecting patient's functional outcome.   REHAB POTENTIAL: Good  CLINICAL DECISION MAKING: Stable/uncomplicated  EVALUATION COMPLEXITY: Low   GOALS: Goals reviewed with patient? Yes  SHORT TERM GOALS: Target date: 08/06/2024    Patient will stand with narrow base with SBA Baseline: Goal status: INITIAL  2.  Patient will increase gross bilateral LE strength by 5 lbs  Baseline:  Goal status: INITIAL  3.  Patient will decrease 5x sit to stand test by 5 sec  Baseline:  Goal status:  INITIAL  4.  Patient will perfrom BERG balance testing Baseline:  Goal status: INITIAL  LONG TERM GOALS: Target date: 09/02/2024    Patient will report no falls over a 1 month period prior to discharge  Baseline:  Goal status: INITIAL  2.  Patient will ambulate limited community distances without pain or loss of balance  Baseline:  Goal status: INITIAL  3.  Patient will have complete and safe home program for balance and gait Baseline:  Goal status: INITIAL   PLAN:  PT FREQUENCY: 2x/week  PT DURATION: 8 weeks  PLANNED INTERVENTIONS: 97110-Therapeutic exercises, 97530- Therapeutic activity, [id number]- Neuromuscular re-education, 97535- Self Care, [id number]- Manual therapy, [id number]- Gait training, [phone]- Aquatic Therapy, Patient/Family education, Stair training, Taping, Dry Needling, DME instructions, Cryotherapy, and Moist heat   PLAN FOR NEXT SESSION:  BERG balance testing.  At some point consider 3-minute walk test.  Begin baseline balance training.  Progress exercise program as tolerated.   Alm JINNY Don, PT 07/09/2024, 12:08 PM

## 2024-07-09 ENCOUNTER — Encounter (HOSPITAL_BASED_OUTPATIENT_CLINIC_OR_DEPARTMENT_OTHER): Payer: Self-pay | Admitting: Physical Therapy

## 2024-07-10 ENCOUNTER — Ambulatory Visit: Admitting: Family Medicine

## 2024-07-10 ENCOUNTER — Encounter: Payer: Self-pay | Admitting: Family Medicine

## 2024-07-10 VITALS — BP 120/60 | HR 81 | Temp 98.0°F | Wt 120.9 lb

## 2024-07-10 DIAGNOSIS — R413 Other amnesia: Secondary | ICD-10-CM | POA: Diagnosis not present

## 2024-07-10 DIAGNOSIS — K5903 Drug induced constipation: Secondary | ICD-10-CM

## 2024-07-10 DIAGNOSIS — Z23 Encounter for immunization: Secondary | ICD-10-CM | POA: Diagnosis not present

## 2024-07-10 DIAGNOSIS — M545 Low back pain, unspecified: Secondary | ICD-10-CM | POA: Diagnosis not present

## 2024-07-10 DIAGNOSIS — R5383 Other fatigue: Secondary | ICD-10-CM

## 2024-07-10 DIAGNOSIS — I10 Essential (primary) hypertension: Secondary | ICD-10-CM | POA: Diagnosis not present

## 2024-07-10 DIAGNOSIS — Z79899 Other long term (current) drug therapy: Secondary | ICD-10-CM

## 2024-07-10 DIAGNOSIS — R296 Repeated falls: Secondary | ICD-10-CM | POA: Diagnosis not present

## 2024-07-10 DIAGNOSIS — T402X5A Adverse effect of other opioids, initial encounter: Secondary | ICD-10-CM | POA: Diagnosis not present

## 2024-07-10 DIAGNOSIS — G8929 Other chronic pain: Secondary | ICD-10-CM | POA: Diagnosis not present

## 2024-07-10 MED ORDER — OXYCODONE HCL ER 10 MG PO T12A: 10.0000 mg | EXTENDED_RELEASE_TABLET | Freq: Two times a day (BID) | ORAL | 0 refills | Status: DC

## 2024-07-10 NOTE — Patient Instructions (Signed)
 Consider daily Miralax  as needed for constipation.

## 2024-07-10 NOTE — Progress Notes (Signed)
 Established Patient Office Visit  Subjective   Patient ID: [name], female    DOB: 07-Nov-1940  Age: 83 y.o. MRN: [id number]  Chief Complaint  Patient presents with   Medical Management of Chronic Issues    HPI   [name] has history of TIA, temporal arteritis, hypertension, chronic back pain, pulmonary fibrosis, rheumatoid arthritis, osteoporosis, frequent falls, history of breast cancer, hyperlipidemia.  She has family history of Alzheimer disease and helps look after her sister lives in [place].  She went to neurologist this past summer and had testing done which showed her to be at risk for Alzheimer's as well.  Patient has noted memory difficulties progressive especially in recent months.  Currently not taking any memory medications.  She is on chronic OxyContin  for her chronic back pain and has been on this for years.  No history of misuse.  Overdue for drug screen.  Does have some chronic constipation.  Recently taking Ex-Lax periodically.  Memory concerns as above.  She does live alone.  Still drives locally.  Has a son down to [place] .  History of frequent falls.  Just recently started physical therapy which we had set up to try to reduce fall risk.  She does ambulate with a walker occasionally with a cane  Hypertension history treated with amlodipine .  Blood pressures have been quite up-and-down.  Apparently had elevation recently of physical therapy.  Does have occasional lightheadedness with standing.  Today, she had initial elevated blood pressure which came down to 142/60 seated and then standing 120/60  She has had some progressive fatigue issues.  Had multiple recent labs done this past summer through neurology.  B12 normal.  Recent TSH normal.  Past Medical History:  Diagnosis Date   Allergic rhinitis    Anemia    Anxiety    Bronchiectasis    Carpal tunnel syndrome 07/13/2015   Bilateral   CVA (cerebral infarction) 10/2007   Right thalamic     Diverticulosis of colon    Gait disorder    GERD (gastroesophageal reflux disease)    History of breast cancer    HTN (hypertension)    Hyperlipidemia    Idiopathic pulmonary fibrosis    Left knee DJD    Migraine    Osteoporosis    Peripheral neuropathy    PMR (polymyalgia rheumatica)    Polyneuropathy in other diseases classified elsewhere 02/17/2013   Previous back surgery 10/09/12   Renal insufficiency    Rheumatoid arthritis (HCC)    Seizure disorder (HCC)    Spondylosis, cervical, with myelopathy 08/31/2015   C3-4 myelopathy   Temporal arteritis (HCC)    Right eye blind, 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   Type II or unspecified type diabetes mellitus without mention of complication, not stated as uncontrolled    2nd to steriods   Vitamin D deficiency    Past Surgical History:  Procedure Laterality Date   APPENDECTOMY  01/2021   CARPAL TUNNEL RELEASE Right    CATARACT EXTRACTION     OS - Summer 2010   CERVICAL FUSION  09/24/2008   4 rods and pins in place   CHOLECYSTECTOMY     COLONOSCOPY  12/15/2011   Procedure: COLONOSCOPY;  Surgeon: Renaye Sous, MD;  Location: [facility];  Service: Endoscopy;  Laterality: N/A;   LUMBAR FUSION  04/24/2010   W/Mechanical fixation - L2-5 [facility]   LUMBAR FUSION  09/25/2011   rods in hips (to stabilize).   MASTECTOMY  NECK SURGERY     rheumatoid nodule removal     SPINAL FUSION  07/25/2010   T10-L2 interbody fusion / [facility]   TONSILLECTOMY     TOTAL KNEE ARTHROPLASTY     Left    reports that she has never smoked. She has never used smokeless tobacco. She reports that she does not currently use alcohol. She reports that she does not use drugs. family history includes Arthritis in her father and mother; Brain cancer in her mother; Breast cancer in her sister; Dementia in her father; Hypertension in her father and mother; Lung cancer in her sister. Allergies  Allergen Reactions   Atorvastatin  Nausea Only and  Other (See Comments)    Dizzyness.    Hydromorphone Other (See Comments)    Cognitive changes  made me unconscious Dilaudid    Review of Systems  Constitutional:  Positive for malaise/fatigue. Negative for chills and fever.  Respiratory:  Positive for shortness of breath. Negative for cough.   Cardiovascular:  Negative for chest pain.  Genitourinary:  Negative for dysuria.  Psychiatric/Behavioral:  Positive for memory loss.       Objective:     BP 120/60 (BP Location: Right Arm, Cuff Size: Normal)   Pulse 81   Temp 98 F (36.7 C) (Oral)   Wt 120 lb 14.4 oz (54.8 kg)   SpO2 96%   BMI 22.84 kg/m  BP Readings from Last 3 Encounters:  07/10/24 120/60  05/29/24 (!) 140/80  05/21/24 130/88   Wt Readings from Last 3 Encounters:  07/10/24 120 lb 14.4 oz (54.8 kg)  05/29/24 121 lb 14.4 oz (55.3 kg)  05/21/24 122 lb 1.6 oz (55.4 kg)      Physical Exam Vitals reviewed.  Constitutional:      General: She is not in acute distress.    Appearance: She is not ill-appearing.  HENT:     Head: Normocephalic and atraumatic.  Cardiovascular:     Rate and Rhythm: Normal rate and regular rhythm.  Pulmonary:     Effort: Pulmonary effort is normal.     Comments: She has crackles in both bases which are chronic and unchanged.  No wheezes. Musculoskeletal:     Right lower leg: No edema.     Left lower leg: No edema.  Neurological:     Mental Status: She is alert.      No results found for any visits on 07/10/24.  Last CBC Lab Results  Component Value Date   WBC 10.7 (H) 05/20/2024   HGB 13.8 05/20/2024   HCT 43.1 05/20/2024   MCV 97.1 05/20/2024   MCH 31.1 05/20/2024   RDW 14.4 05/20/2024   PLT 249 05/20/2024   Last metabolic panel Lab Results  Component Value Date   GLUCOSE 95 05/20/2024   NA 142 05/20/2024   K 4.0 05/20/2024   CL 102 05/20/2024   CO2 25 05/20/2024   BUN 14 05/20/2024   CREATININE 1.28 (H) 05/20/2024   GFRNONAA 41 (L) 05/20/2024   CALCIUM   10.0 05/20/2024   PHOS 2.0 (L) 08/02/2022   PROT 6.8 11/19/2023   ALBUMIN 4.1 11/19/2023   LABGLOB 2.7 11/19/2023   BILITOT 1.2 11/19/2023   ALKPHOS 80 11/19/2023   AST 23 11/19/2023   ALT 11 11/19/2023   ANIONGAP 15 05/20/2024   Last hemoglobin A1c Lab Results  Component Value Date   HGBA1C 5.0 08/03/2022   Last thyroid  functions Lab Results  Component Value Date   TSH 2.490 11/19/2023  Last vitamin B12 and Folate Lab Results  Component Value Date   VITAMINB12 >2000 (H) 02/25/2024   FOLATE  11/03/2007    10.0 (NOTE)  Reference Ranges        Deficient:       0.4 - 3.4 ng/mL        Indeterminate:   3.4 - 5.4 ng/mL        Normal:              > 5.4 ng/mL    The ASCVD Risk score (Arnett DK, et al., 2019) failed to calculate for the following reasons:   The 2019 ASCVD risk score is only valid for ages 8 to 36    Assessment & Plan:   #1 opioid induced constipation.  Recommend she consider if necessary daily MiraLAX .  Could also add stool softener as needed.  Also reiterated importance of high-fiber diet and plenty of fluids  #2 memory loss.  Patient had genetic testing through neurology which showed her to be at risk for Alzheimer's dementia.  She has a sister with this.  She was encouraged to continue close follow-up with neurology  #3 frequent falls.  Ambulates with walker.  Just recently started physical therapy  #4 hypertension.  She had significant drop today with seated to standing.  Not having consistent dizziness.  She is on amlodipine  and if starts to have more frequent symptoms of orthostasis discontinue vasodilator and switch perhaps to ACE or ARB  #5 chronic pain involving back.  Stable.  Refill OxyContin  for 3 months.  Urine drug screen obtained.  PDMP reviewed with no concerns  #6 chronic fatigue.  Probably multifactorial.  Recent B12 and TSH normal. Return in about 3 months (around 10/10/2024).    Wolm Scarlet, MD

## 2024-07-16 ENCOUNTER — Ambulatory Visit: Payer: Self-pay

## 2024-07-16 NOTE — Telephone Encounter (Signed)
 FYI Only or Action Required?: FYI only for provider.  Patient was last seen in primary care on 07/10/2024 by [name] ORN, MD.  Called Nurse Triage reporting Dizziness, Fall, Memory Loss, and Blood Pressure Check.  Symptoms began several months ago.  Interventions attempted: Rest, hydration, or home remedies.  Symptoms are: unchanged.  Triage Disposition: See Physician Within 24 Hours  Patient/caregiver understands and will follow disposition?: Yes         Copied from CRM [id number]. Topic: Clinical - Red Word Triage >> Jul 16, 2024  9:31 AM [name] wrote: Red Word that prompted transfer to Nurse Triage: Pt called in stating that she experiences dizziness, frequent falls and memory loss. When asked how long pt stated it has be ongoing for several months now. Pt also mentioned that she is having trouble with her blood pressure, pt stated its fine when she is sitting still but when she gets up it drops. Pt stated this has been ongoing for a couple of weeks. Warm transferred to nurse triage. Reason for Disposition  [1] MODERATE dizziness (e.g., interferes with normal activities) AND [2] has NOT been evaluated by doctor (or NP/PA) for this  (Exception: Dizziness caused by heat exposure, sudden standing, or poor fluid intake.)    Pt recently seen PCP and had mentioned concerns-- PCP advised pt to continue to F/U at specialist appt this week. Specialist appt was unexpectedly canceled and pt was rescheduled to February. Pt has concerns with appt being too far away and would like to see if PCP can give further guidance/get her a sooner appt.  Answer Assessment - Initial Assessment Questions 1. DESCRIPTION: Describe your dizziness.     Dizzy spells when I stand up and walk - reports recently seen PCP and was advised to F/U with specialist appt this week. Pt reports specialist appt was canceled and was rescheduled until February. 2. LIGHTHEADED: Do you feel lightheaded? (e.g.,  somewhat faint, woozy, weak upon standing)     Weak upon standing 3. VERTIGO: Do you feel like either you or the room is spinning or tilting? (i.e., vertigo)     denies 4. SEVERITY: How bad is it?  Do you feel like you are going to faint? Can you stand and walk?     Is able to walk with assistive device. 5. ONSET:  When did the dizziness begin?     A few months 6. AGGRAVATING FACTORS: Does anything make it worse? (e.g., standing, change in head position)     Standing.  7. HEART RATE: Can you tell me your heart rate? How many beats in 15 seconds?  (Note: Not all patients can do this.)       N/a 8. CAUSE: What do you think is causing the dizziness? (e.g., decreased fluids or food, diarrhea, emotional distress, heat exposure, new medicine, sudden standing, vomiting; unknown)     *No Answer* 9. RECURRENT SYMPTOM: Have you had dizziness before? If Yes, ask: When was the last time? What happened that time?     Yes, sometimes falls. Last fall was this weekend, endorses falling backwards on back. 10. OTHER SYMPTOMS: Do you have any other symptoms? (e.g., fever, chest pain, vomiting, diarrhea, bleeding)       Falls, blood pressure issues. 11. PREGNANCY: Is there any chance you are pregnant? When was your last menstrual period?       N/a  Protocols used: Dizziness - Lightheadedness-A-AH

## 2024-07-17 ENCOUNTER — Encounter: Payer: Self-pay | Admitting: Family Medicine

## 2024-07-17 ENCOUNTER — Ambulatory Visit (INDEPENDENT_AMBULATORY_CARE_PROVIDER_SITE_OTHER): Admitting: Family Medicine

## 2024-07-17 VITALS — BP 120/70 | HR 86 | Temp 98.2°F | Wt 119.7 lb

## 2024-07-17 DIAGNOSIS — I951 Orthostatic hypotension: Secondary | ICD-10-CM | POA: Diagnosis not present

## 2024-07-17 DIAGNOSIS — I10 Essential (primary) hypertension: Secondary | ICD-10-CM

## 2024-07-17 DIAGNOSIS — R296 Repeated falls: Secondary | ICD-10-CM | POA: Diagnosis not present

## 2024-07-17 DIAGNOSIS — R42 Dizziness and giddiness: Secondary | ICD-10-CM | POA: Diagnosis not present

## 2024-07-17 MED ORDER — LOSARTAN POTASSIUM 50 MG PO TABS: 50.0000 mg | ORAL_TABLET | Freq: Every day | ORAL | 3 refills | Status: AC

## 2024-07-17 MED ORDER — FUROSEMIDE 20 MG PO TABS: 20.0000 mg | ORAL_TABLET | Freq: Every day | ORAL | 3 refills | Status: AC

## 2024-07-17 NOTE — Patient Instructions (Addendum)
 STOP the Amlodipine   STOP the Lasix  40 mg   START Lasix  20 mg daily  START the Losartan  50 mg daily (new BP medication).  Try to consume at least 64 ounces per day.

## 2024-07-17 NOTE — Progress Notes (Signed)
 Established Patient Office Visit  Subjective   Patient ID: [name], female    DOB: [birth year]  Age: 83 y.o. MRN: [id number]  Chief Complaint  Patient presents with   Dizziness   Fall    HPI    [name] is seen with ongoing issues of dizziness and frequent falls.  She has chronic problems including hypertension, past history of temporal arteritis, history of TIA, pulmonary fibrosis, chronic back pain, chronic opioid therapy, osteoporosis, rheumatoid arthritis, history of breast cancer.  She states this past Sunday she was helping her sister in [place], [place] who has dementia and when she was walking she felt nauseous suddenly and dizzy and fell backwards.  No loss of consciousness.  She has had multiple episodes recently where she stood up suddenly and felt lightheaded.  She has had some gradual weight loss in recent years and blood pressure has been declining somewhat.  Last visit she had seated blood pressure 142/60 and standing 120/60.  Currently on furosemide  40 mg daily amlodipine  5 mg daily.  She has had some general fatigue issues.  She thinks she is drinking about 50 to 60 ounces per day.  Denies any head injury with recent fall.  No chest pain.  Had labs in August with hemoglobin 13.8.  Electrolytes and kidney function were stable at that time.  Past Medical History:  Diagnosis Date   Allergic rhinitis    Anemia    Anxiety    Bronchiectasis    Carpal tunnel syndrome 07/13/2015   Bilateral   CVA (cerebral infarction) 10/2007   Right thalamic    Diverticulosis of colon    Gait disorder    GERD (gastroesophageal reflux disease)    History of breast cancer    HTN (hypertension)    Hyperlipidemia    Idiopathic pulmonary fibrosis    Left knee DJD    Migraine    Osteoporosis    Peripheral neuropathy    PMR (polymyalgia rheumatica)    Polyneuropathy in other diseases classified elsewhere 02/17/2013   Previous back surgery 10/09/12   Renal insufficiency    Rheumatoid  arthritis (HCC)    Seizure disorder (HCC)    Spondylosis, cervical, with myelopathy 08/31/2015   C3-4 myelopathy   Temporal arteritis (HCC)    Right eye blind, 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   Type II or unspecified type diabetes mellitus without mention of complication, not stated as uncontrolled    2nd to steriods   Vitamin D deficiency    Past Surgical History:  Procedure Laterality Date   APPENDECTOMY  01/2021   CARPAL TUNNEL RELEASE Right    CATARACT EXTRACTION     OS - Summer 2010   CERVICAL FUSION  09/24/2008   4 rods and pins in place   CHOLECYSTECTOMY     COLONOSCOPY  12/15/2011   Procedure: COLONOSCOPY;  Surgeon: Renaye Sous, MD;  Location: [facility];  Service: Endoscopy;  Laterality: N/A;   LUMBAR FUSION  04/24/2010   W/Mechanical fixation - L2-5 [facility]   LUMBAR FUSION  09/25/2011   rods in hips (to stabilize).   MASTECTOMY     NECK SURGERY     rheumatoid nodule removal     SPINAL FUSION  07/25/2010   T10-L2 interbody fusion / [facility]   TONSILLECTOMY     TOTAL KNEE ARTHROPLASTY     Left    reports that she has never smoked. She has never used smokeless tobacco. She reports that she does  not currently use alcohol. She reports that she does not use drugs. family history includes Arthritis in her father and mother; Brain cancer in her mother; Breast cancer in her sister; Dementia in her father; Hypertension in her father and mother; Lung cancer in her sister. Allergies  Allergen Reactions   Atorvastatin  Nausea Only and Other (See Comments)    Dizzyness.    Hydromorphone Other (See Comments)    Cognitive changes  made me unconscious Dilaudid    Review of Systems  Constitutional:  Negative for chills, fever and malaise/fatigue.  Eyes:  Negative for blurred vision.  Respiratory:  Negative for shortness of breath.   Cardiovascular:  Negative for chest pain.  Gastrointestinal:  Negative for abdominal pain.  Genitourinary:  Negative for  dysuria.  Musculoskeletal:  Positive for back pain and neck pain.  Neurological:  Positive for dizziness. Negative for sensory change, focal weakness, weakness and headaches.      Objective:     BP 120/70   Pulse 86   Temp 98.2 F (36.8 C) (Oral)   Wt 119 lb 11.2 oz (54.3 kg)   SpO2 94%   BMI 22.62 kg/m  BP Readings from Last 3 Encounters:  07/17/24 120/70  07/10/24 120/60  05/29/24 (!) 140/80   Wt Readings from Last 3 Encounters:  07/17/24 119 lb 11.2 oz (54.3 kg)  07/10/24 120 lb 14.4 oz (54.8 kg)  05/29/24 121 lb 14.4 oz (55.3 kg)      Physical Exam Vitals reviewed.  Constitutional:      General: She is not in acute distress.    Appearance: She is not ill-appearing.  Cardiovascular:     Rate and Rhythm: Normal rate and regular rhythm.  Pulmonary:     Effort: Pulmonary effort is normal.  Musculoskeletal:     Right lower leg: No edema.     Left lower leg: No edema.  Neurological:     General: No focal deficit present.     Mental Status: She is alert.     Cranial Nerves: No cranial nerve deficit.      No results found for any visits on 07/17/24.  Last CBC Lab Results  Component Value Date   WBC 10.7 (H) 05/20/2024   HGB 13.8 05/20/2024   HCT 43.1 05/20/2024   MCV 97.1 05/20/2024   MCH 31.1 05/20/2024   RDW 14.4 05/20/2024   PLT 249 05/20/2024   Last metabolic panel Lab Results  Component Value Date   GLUCOSE 95 05/20/2024   NA 142 05/20/2024   K 4.0 05/20/2024   CL 102 05/20/2024   CO2 25 05/20/2024   BUN 14 05/20/2024   CREATININE 1.28 (H) 05/20/2024   GFRNONAA 41 (L) 05/20/2024   CALCIUM  10.0 05/20/2024   PHOS 2.0 (L) 08/02/2022   PROT 6.8 11/19/2023   ALBUMIN 4.1 11/19/2023   LABGLOB 2.7 11/19/2023   BILITOT 1.2 11/19/2023   ALKPHOS 80 11/19/2023   AST 23 11/19/2023   ALT 11 11/19/2023   ANIONGAP 15 05/20/2024   Last lipids Lab Results  Component Value Date   CHOL 143 08/03/2022   HDL 47 08/03/2022   LDLCALC 85 08/03/2022    TRIG 56 08/03/2022   CHOLHDL 3.0 08/03/2022   Last hemoglobin A1c Lab Results  Component Value Date   HGBA1C 5.0 08/03/2022   Last thyroid  functions Lab Results  Component Value Date   TSH 2.490 11/19/2023      The ASCVD Risk score (Arnett DK, et al., 2019) failed to  calculate for the following reasons:   The 2019 ASCVD risk score is only valid for ages 86 to 9    Assessment & Plan:   #1 dizziness/orthostasis.  She has had gradual weight gain in recent years which may be contributing to her lower blood pressures.  Blood pressure today right arm seated 120/60 and standing 102/58.  We saw similar approximately 20 point systolic drop last visit.  She is currently a vasodilator with amlodipine .  Also takes furosemide  40 mg daily in recent edema's been stable.  We suggested the following changes:  -Stop amlodipine  and start losartan  50 mg daily - Reduce Lasix  from 40 to 20 mg daily - Stay well-hydrated with goal of around 64 ounces per day - Change positions slowly - Set up 1 month follow-up to reassess orthostatic blood pressures  #2 frequent falls.  Difficult to sort out how much of this may be related to #1.  She has had extensive physical therapy in the past.  Continue walker use at all times  Wolm Scarlet, MD

## 2024-07-22 DIAGNOSIS — M0589 Other rheumatoid arthritis with rheumatoid factor of multiple sites: Secondary | ICD-10-CM | POA: Diagnosis not present

## 2024-07-23 DIAGNOSIS — D3132 Benign neoplasm of left choroid: Secondary | ICD-10-CM | POA: Diagnosis not present

## 2024-07-23 DIAGNOSIS — H4720 Unspecified optic atrophy: Secondary | ICD-10-CM | POA: Diagnosis not present

## 2024-07-23 DIAGNOSIS — H52202 Unspecified astigmatism, left eye: Secondary | ICD-10-CM | POA: Diagnosis not present

## 2024-07-30 DIAGNOSIS — M79645 Pain in left finger(s): Secondary | ICD-10-CM | POA: Diagnosis not present

## 2024-07-30 DIAGNOSIS — M79672 Pain in left foot: Secondary | ICD-10-CM | POA: Diagnosis not present

## 2024-07-30 DIAGNOSIS — M810 Age-related osteoporosis without current pathological fracture: Secondary | ICD-10-CM | POA: Diagnosis not present

## 2024-07-30 DIAGNOSIS — M0589 Other rheumatoid arthritis with rheumatoid factor of multiple sites: Secondary | ICD-10-CM | POA: Diagnosis not present

## 2024-07-30 DIAGNOSIS — R600 Localized edema: Secondary | ICD-10-CM | POA: Diagnosis not present

## 2024-07-30 DIAGNOSIS — J8410 Pulmonary fibrosis, unspecified: Secondary | ICD-10-CM | POA: Diagnosis not present

## 2024-07-30 DIAGNOSIS — Z6821 Body mass index (BMI) 21.0-21.9, adult: Secondary | ICD-10-CM | POA: Diagnosis not present

## 2024-07-30 DIAGNOSIS — N184 Chronic kidney disease, stage 4 (severe): Secondary | ICD-10-CM | POA: Diagnosis not present

## 2024-07-30 DIAGNOSIS — M316 Other giant cell arteritis: Secondary | ICD-10-CM | POA: Diagnosis not present

## 2024-07-30 DIAGNOSIS — M1991 Primary osteoarthritis, unspecified site: Secondary | ICD-10-CM | POA: Diagnosis not present

## 2024-07-30 DIAGNOSIS — M5136 Other intervertebral disc degeneration, lumbar region with discogenic back pain only: Secondary | ICD-10-CM | POA: Diagnosis not present

## 2024-08-04 ENCOUNTER — Encounter (HOSPITAL_BASED_OUTPATIENT_CLINIC_OR_DEPARTMENT_OTHER): Admitting: Physical Therapy

## 2024-08-06 DIAGNOSIS — M4802 Spinal stenosis, cervical region: Secondary | ICD-10-CM | POA: Diagnosis not present

## 2024-08-06 DIAGNOSIS — D631 Anemia in chronic kidney disease: Secondary | ICD-10-CM | POA: Diagnosis not present

## 2024-08-06 DIAGNOSIS — N189 Chronic kidney disease, unspecified: Secondary | ICD-10-CM | POA: Diagnosis not present

## 2024-08-06 DIAGNOSIS — R32 Unspecified urinary incontinence: Secondary | ICD-10-CM | POA: Diagnosis not present

## 2024-08-06 DIAGNOSIS — I129 Hypertensive chronic kidney disease with stage 1 through stage 4 chronic kidney disease, or unspecified chronic kidney disease: Secondary | ICD-10-CM | POA: Diagnosis not present

## 2024-08-06 DIAGNOSIS — J8410 Pulmonary fibrosis, unspecified: Secondary | ICD-10-CM | POA: Diagnosis not present

## 2024-08-06 DIAGNOSIS — N1832 Chronic kidney disease, stage 3b: Secondary | ICD-10-CM | POA: Diagnosis not present

## 2024-08-06 DIAGNOSIS — M069 Rheumatoid arthritis, unspecified: Secondary | ICD-10-CM | POA: Diagnosis not present

## 2024-08-07 ENCOUNTER — Ambulatory Visit (INDEPENDENT_AMBULATORY_CARE_PROVIDER_SITE_OTHER): Admitting: Adult Health

## 2024-08-07 ENCOUNTER — Telehealth: Payer: Self-pay

## 2024-08-07 ENCOUNTER — Encounter: Payer: Self-pay | Admitting: Adult Health

## 2024-08-07 VITALS — BP 100/60 | HR 87 | Temp 98.3°F | Ht 61.0 in | Wt 119.0 lb

## 2024-08-07 DIAGNOSIS — J020 Streptococcal pharyngitis: Secondary | ICD-10-CM | POA: Diagnosis not present

## 2024-08-07 LAB — LAB REPORT - SCANNED: EGFR: 45

## 2024-08-07 LAB — POCT RAPID STREP A (OFFICE): Rapid Strep A Screen: POSITIVE — AB

## 2024-08-07 LAB — POC COVID19 BINAXNOW: SARS Coronavirus 2 Ag: NEGATIVE

## 2024-08-07 MED ORDER — AMOXICILLIN 500 MG PO CAPS: 500.0000 mg | ORAL_CAPSULE | Freq: Two times a day (BID) | ORAL | 0 refills | Status: AC

## 2024-08-07 NOTE — Progress Notes (Signed)
 Subjective:    Patient ID: [name], female    DOB: [birth year], 83 y.o.   MRN: [id number]  HPI  Discussed the use of AI scribe software for clinical note transcription with the patient, who gave verbal consent to proceed.  History of Present Illness   [name] is an 83 year old female who presents with symptoms of a sore throat and runny nose.  Symptoms began three days ago with a runny nose and sore throat. She denies fever, chills, or pain on swallowing. Intermittent headaches and general fatigue are present.       Review of Systems See HPI   Past Medical History:  Diagnosis Date   Allergic rhinitis    Anemia    Anxiety    Bronchiectasis    Carpal tunnel syndrome 07/13/2015   Bilateral   CVA (cerebral infarction) 10/2007   Right thalamic    Diverticulosis of colon    Gait disorder    GERD (gastroesophageal reflux disease)    History of breast cancer    HTN (hypertension)    Hyperlipidemia    Idiopathic pulmonary fibrosis    Left knee DJD    Migraine    Osteoporosis    Peripheral neuropathy    PMR (polymyalgia rheumatica)    Polyneuropathy in other diseases classified elsewhere 02/17/2013   Previous back surgery 10/09/12   Renal insufficiency    Rheumatoid arthritis (HCC)    Seizure disorder (HCC)    Spondylosis, cervical, with myelopathy 08/31/2015   C3-4 myelopathy   Temporal arteritis (HCC)    Right eye blind, 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   Type II or unspecified type diabetes mellitus without mention of complication, not stated as uncontrolled    2nd to steriods   Vitamin D deficiency     Social History   Socioeconomic History   Marital status: Widowed    Spouse name: Not on file   Number of children: 1   Years of education: 12+ coll.   Highest education level: Not on file  Occupational History   Occupation: Clinical Cytogeneticist: RETIRED    Comment: retired  Tobacco Use   Smoking status:  Never   Smokeless tobacco: Never   Tobacco comments:    Father   Vaping Use   Vaping status: Never Used  Substance and Sexual Activity   Alcohol use: Not Currently    Comment: heavy drinker until 1995 - sobriety with AA   Drug use: No   Sexual activity: Not Currently  Other Topics Concern   Not on file  Social History Narrative   07/03/21 friend Avelina living with her   Right handed   Drinks 6-8 cups caffeine daily      Social Drivers of Corporate Investment Banker Strain: Low Risk  (07/12/2023)   Overall Financial Resource Strain (CARDIA)    Difficulty of Paying Living Expenses: Not hard at all  Food Insecurity: No Food Insecurity (07/12/2023)   Hunger Vital Sign    Worried About Running Out of Food in the Last Year: Never true    Ran Out of Food in the Last Year: Never true  Transportation Needs: No Transportation Needs (07/12/2023)   PRAPARE - Administrator, Civil Service (Medical): No    Lack of Transportation (Non-Medical): No  Physical Activity: Inactive (07/12/2023)   Exercise Vital Sign    Days of Exercise per Week: 0 days  Minutes of Exercise per Session: 0 min  Stress: No Stress Concern Present (07/12/2023)   [employer] of Occupational Health - Occupational Stress Questionnaire    Feeling of Stress : Not at all  Social Connections: Moderately Integrated (07/12/2023)   Social Connection and Isolation Panel    Frequency of Communication with Friends and Family: More than three times a week    Frequency of Social Gatherings with Friends and Family: More than three times a week    Attends Religious Services: More than 4 times per year    Active Member of [employer] or Organizations: Yes    Attends Banker Meetings: More than 4 times per year    Marital Status: Widowed  Intimate Partner Violence: Not At Risk (07/12/2023)   Humiliation, Afraid, Rape, and Kick questionnaire    Fear of Current or Ex-Partner: No    Emotionally Abused:  No    Physically Abused: No    Sexually Abused: No    Past Surgical History:  Procedure Laterality Date   APPENDECTOMY  01/2021   CARPAL TUNNEL RELEASE Right    CATARACT EXTRACTION     OS - Summer 2010   CERVICAL FUSION  09/24/2008   4 rods and pins in place   CHOLECYSTECTOMY     COLONOSCOPY  12/15/2011   Procedure: COLONOSCOPY;  Surgeon: Renaye Sous, MD;  Location: [facility];  Service: Endoscopy;  Laterality: N/A;   LUMBAR FUSION  04/24/2010   W/Mechanical fixation - L2-5 [facility]   LUMBAR FUSION  09/25/2011   rods in hips (to stabilize).   MASTECTOMY     NECK SURGERY     rheumatoid nodule removal     SPINAL FUSION  07/25/2010   T10-L2 interbody fusion / [facility]   TONSILLECTOMY     TOTAL KNEE ARTHROPLASTY     Left    Family History  Problem Relation Age of Onset   Brain cancer Mother    Hypertension Mother    Arthritis Mother    Dementia Father    Hypertension Father    Arthritis Father    Breast cancer Sister    Lung cancer Sister    Lung disease Neg Hx    Rheumatologic disease Neg Hx     Allergies  Allergen Reactions   Atorvastatin  Nausea Only and Other (See Comments)    Dizzyness.    Hydromorphone Other (See Comments)    Cognitive changes  made me unconscious Dilaudid    Current Outpatient Medications on File Prior to Visit  Medication Sig Dispense Refill   Calcium  Carb-Cholecalciferol  (CALCIUM  CARBONATE-VITAMIN D3 PO) Take 1 tablet by mouth daily     cholecalciferol  (VITAMIN D) 1000 UNITS tablet Take 1,000 Units by mouth daily.     denosumab  (PROLIA ) 60 MG/ML SOLN injection Inject 60 mg into the skin every 6 (six) months.     docusate sodium  (COLACE) 100 MG capsule Take 100 mg by mouth 2 (two) times daily.     furosemide  (LASIX ) 20 MG tablet Take 1 tablet (20 mg total) by mouth daily. 90 tablet 3   gabapentin  (NEURONTIN ) 300 MG capsule TAKE 1 CAPSULE TWICE A DAY 180 capsule 3   Golimumab  (SIMPONI  ARIA IV) Inject into the vein.  Takes infusion every 8 wks.     losartan  (COZAAR ) 50 MG tablet Take 1 tablet (50 mg total) by mouth daily. 90 tablet 3   Multiple Vitamins-Minerals (MULTIVITAMIN,TX-MINERALS) tablet Take 1 tablet by mouth daily.     Omega-3 Fatty  Acids (FISH OIL OMEGA-3 PO) Take 1 capsule by mouth daily.     oxyCODONE  (OXYCONTIN ) 10 mg 12 hr tablet Take 1 tablet (10 mg total) by mouth every 12 (twelve) hours. 180 tablet 0   Oxycodone  HCl 10 MG TABS Take 1 tablet (10 mg total) by mouth every 6 (six) hours as needed. Take 1 tablet by mouth every 6 hours as needed for breakthrough pain 20 tablet 0   potassium chloride  (KLOR-CON  M) 10 MEQ tablet TAKE 1 TABLET TWICE A DAY 180 tablet 1   predniSONE  (DELTASONE ) 1 MG tablet TAKE 4 TABLETS DAILY WITH BREAKFAST 360 tablet 3   vitamin B-12 (CYANOCOBALAMIN ) 100 MCG tablet Take 100 mcg by mouth daily.     No current facility-administered medications on file prior to visit.    BP 100/60   Pulse 87   Temp 98.3 F (36.8 C) (Oral)   Ht 5' 1 (1.549 m)   Wt 119 lb (54 kg)   SpO2 97%   BMI 22.48 kg/m       Objective:   Physical Exam Vitals and nursing note reviewed.  Constitutional:      Appearance: Normal appearance.  HENT:     Nose: Nose normal. No congestion or rhinorrhea.     Mouth/Throat:     Mouth: Mucous membranes are moist.     Pharynx: Oropharyngeal exudate and posterior oropharyngeal erythema present.     Tonsils: 0 on the right. 0 on the left.  Cardiovascular:     Rate and Rhythm: Regular rhythm.     Pulses: Normal pulses.     Heart sounds: Normal heart sounds.  Pulmonary:     Effort: Pulmonary effort is normal.     Breath sounds: Normal breath sounds.  Skin:    General: Skin is warm and dry.     Capillary Refill: Capillary refill takes less than 2 seconds.  Neurological:     General: No focal deficit present.     Mental Status: She is alert and oriented to person, place, and time.  Psychiatric:        Mood and Affect: Mood normal.         Behavior: Behavior normal.        Thought Content: Thought content normal.        Judgment: Judgment normal.        Assessment & Plan:  Assessment and Plan    Streptococcal pharyngitis Acute streptococcal pharyngitis confirmed by positive strep test. Treatable with antibiotics, expected symptom improvement by weekend. - Prescribed amoxicillin  500 mg orally twice daily for 10 days. - Advised to start antibiotics tonight if available. - Instructed to complete full course to prevent recurrence. - Advised to avoid close contact with others for 24 hours after starting antibiotics. - Recommended wearing a mask and avoiding sharing drinks or utensils.      Kemi Gell, NP

## 2024-08-07 NOTE — Telephone Encounter (Signed)
 Covid rx no longer required and message will be addressed at visit today

## 2024-08-07 NOTE — Telephone Encounter (Signed)
 Copied from CRM #[id number]. Topic: Clinical - Medication Question >> Aug 07, 2024  8:01 AM Mercedes MATSU wrote: Reason for CRM: Patient is requesting her covid vaccine rx sent to her pharmacy on file. Patient will be seen in office today for testing.

## 2024-08-11 DIAGNOSIS — M810 Age-related osteoporosis without current pathological fracture: Secondary | ICD-10-CM | POA: Diagnosis not present

## 2024-08-12 ENCOUNTER — Encounter (HOSPITAL_BASED_OUTPATIENT_CLINIC_OR_DEPARTMENT_OTHER): Payer: Self-pay | Admitting: Physical Therapy

## 2024-08-12 ENCOUNTER — Ambulatory Visit (HOSPITAL_BASED_OUTPATIENT_CLINIC_OR_DEPARTMENT_OTHER): Attending: Family Medicine | Admitting: Physical Therapy

## 2024-08-12 DIAGNOSIS — R2681 Unsteadiness on feet: Secondary | ICD-10-CM | POA: Diagnosis not present

## 2024-08-12 DIAGNOSIS — R293 Abnormal posture: Secondary | ICD-10-CM | POA: Diagnosis not present

## 2024-08-12 DIAGNOSIS — R2689 Other abnormalities of gait and mobility: Secondary | ICD-10-CM | POA: Diagnosis not present

## 2024-08-12 DIAGNOSIS — M6281 Muscle weakness (generalized): Secondary | ICD-10-CM | POA: Diagnosis not present

## 2024-08-12 DIAGNOSIS — M5459 Other low back pain: Secondary | ICD-10-CM | POA: Insufficient documentation

## 2024-08-12 NOTE — Therapy (Signed)
 OUTPATIENT PHYSICAL THERAPY LOWER EXTREMITY TREATMENT    Patient Name: [name] MRN: [id number] DOB:11/08/1940, 83 y.o., female Today's Date: 08/12/2024  END OF SESSION:  PT End of Session - 08/12/24 1500     Visit Number 2    Number of Visits 16    Date for Recertification  09/02/24    PT Start Time 1434    PT Stop Time 1514    PT Time Calculation (min) 40 min    Activity Tolerance Patient tolerated treatment well    Behavior During Therapy [facility] for tasks assessed/performed           Past Medical History:  Diagnosis Date   Allergic rhinitis    Anemia    Anxiety    Bronchiectasis    Carpal tunnel syndrome 07/13/2015   Bilateral   CVA (cerebral infarction) 10/2007   Right thalamic    Diverticulosis of colon    Gait disorder    GERD (gastroesophageal reflux disease)    History of breast cancer    HTN (hypertension)    Hyperlipidemia    Idiopathic pulmonary fibrosis    Left knee DJD    Migraine    Osteoporosis    Peripheral neuropathy    PMR (polymyalgia rheumatica)    Polyneuropathy in other diseases classified elsewhere 02/17/2013   Previous back surgery 10/09/12   Renal insufficiency    Rheumatoid arthritis (HCC)    Seizure disorder (HCC)    Spondylosis, cervical, with myelopathy 08/31/2015   C3-4 myelopathy   Temporal arteritis (HCC)    Right eye blind, 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   Type II or unspecified type diabetes mellitus without mention of complication, not stated as uncontrolled    2nd to steriods   Vitamin D deficiency    Past Surgical History:  Procedure Laterality Date   APPENDECTOMY  01/2021   CARPAL TUNNEL RELEASE Right    CATARACT EXTRACTION     OS - Summer 2010   CERVICAL FUSION  09/24/2008   4 rods and pins in place   CHOLECYSTECTOMY     COLONOSCOPY  12/15/2011   Procedure: COLONOSCOPY;  Surgeon: Renaye Sous, MD;  Location: [facility];  Service: Endoscopy;  Laterality: N/A;   LUMBAR FUSION  04/24/2010   W/Mechanical  fixation - L2-5 [facility]   LUMBAR FUSION  09/25/2011   rods in hips (to stabilize).   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 08/25/2019   Pain management 10/30/2017   Rheumatoid arthritis (HCC) 04/25/2017   S/P cervical spinal fusion 09/05/2015   Spondylosis, cervical, with myelopathy 08/31/2015   Carpal tunnel syndrome 07/13/2015   Torn ear lobe 06/07/2015   Obesity (BMI 30-39.9) 04/06/2014   Shortness of breath 07/14/2013   Bone cyst 07/14/2013   Polyneuropathy in other diseases classified elsewhere 02/17/2013  Chronic back pain 12/09/2012   Peripheral edema 09/25/2012   Anemia due to chronic illness 09/01/2012   DDD (degenerative disc disease), lumbosacral 08/12/2012   Cervicalgia 07/11/2012   Disturbance of skin sensation 07/11/2012   Temporal arteritis (HCC) 04/01/2012   Lower GI bleed 12/24/2011   Rectal bleed 12/13/2011   Arthrodesis status 09/04/2011   Chronic renal insufficiency, stage II (mild) 04/04/2011   LUMBAR RADICULOPATHY, LEFT 04/18/2009   Essential hypertension 12/30/2007   SEIZURE DISORDER 12/30/2007   CEREBROVASCULAR ACCIDENT, HX OF 12/30/2007   Hyperlipidemia 10/08/2007   GERD 10/08/2007   DIVERTICULOSIS, COLON 10/08/2007   BRONCHIECTASIS 06/26/2007   PULMONARY FIBROSIS 05/17/2007   Osteoporosis 05/17/2007   BREAST CANCER, HX OF 05/17/2007   MASTECTOMY, LEFT, HX OF  05/17/2007    PCP: Dr Wolm Blunt   REFERRING PROVIDER: Dr Wolm Jobs   REFERRING DIAG:  Diagnosis  R29.6 (ICD-10-CM) - Frequent falls    THERAPY DIAG:  Other abnormalities of gait and mobility  Unsteadiness on feet  Abnormal posture  Muscle weakness (generalized)  Rationale for Evaluation and Treatment: Rehabilitation  ONSET DATE:  Has had more falls over the past 6 months   SUBJECTIVE:   SUBJECTIVE STATEMENT:   After PT eval, I started not feeling good and falling more. Went to the doctor, they said to take it easy for awhile and then when I felt better come back to PT. Had to be on antibiotics for a sore throat recently. I fall a lot and I've hit my head a couple of time since last time I was seen here.   Eval: The patient has a long history of falls. The frequency has increased over the past few months. She has fallen 6x over the past 6 months . Her falls have come for a variety of reasons. She has hit her head with several of the falls. At times she feels like her legs give out. She uses a Rolator times but when she falls she is not using it at times.  She has an extensive past medical history of lumbar spine surgeries as well as PMR and RA which also likely contributed to falls.  PERTINENT HISTORY: RA, Left knee replacement, temporal arteritis, C-4-c4 fusion  Lumbar spine fusion t-10-L2, polyneuropathy PMR, migraine, history of breat cancer, gait disorder, frequent falls, anxiety, anemia, DMII,  PAIN:  Are you having pain?  NPRS scale: 7/10 when it happens, not constant  Pain location: wrapping around R lower ribs  Pain description: very sore Aggravating factors: turning L  Relieving factors:  rest   PRECAUTIONS: Fall  RED FLAGS: None   WEIGHT BEARING RESTRICTIONS: No  FALLS:  Has patient fallen in last 6 months? Yes. Number of falls 6  LIVING ENVIRONMENT: Has a ramp into the house,  OCCUPATION:  Retired  Hobbies:  Likes to walk   PLOF:  Independent  PATIENT GOALS:  To decrease the amount of falls she has  NEXT MD VISIT:    OBJECTIVE:  Note: Objective measures were completed at Evaluation unless otherwise noted.  DIAGNOSTIC FINDINGS:    PATIENT SURVEYS:  LEFS  Extreme difficulty/unable (0), Quite a bit of difficulty (1), Moderate difficulty (2), Little difficulty (3), No difficulty (4) Survey date:    Any of your usual work, housework or school activities   2. Usual hobbies, recreational or sporting activities   3. Getting into/out of the bath   4. Walking between rooms   5. Putting on socks/shoes   6. Squatting  7. Lifting an object, like a bag of groceries from the floor   8. Performing light activities around your home   9. Performing heavy activities around your home   10. Getting into/out of a car   11. Walking 2 blocks   12. Walking 1 mile   13. Going up/down 10 stairs (1 flight)   14. Standing for 1 hour   15.  sitting for 1 hour   16. Running on even ground   17. Running on uneven ground   18. Making sharp turns while running fast   19. Hopping    20. Rolling over in bed   Score total:  28/80     COGNITION: Overall cognitive status: Within functional limits for tasks assessed  Feels like her memory is getting a little worse    SENSATION: Numbness in bilateral feet      POSTURE: flexed trunk   PALPATION:  No significant tenderness to palpation in the back at this time    (Blank rows = not tested)  LOWER EXTREMITY MMT:  MMT Right eval Left eval  Hip flexion 16.9 16.3  Hip extension    Hip abduction 13.2 16.6  Hip adduction    Hip internal rotation    Hip external rotation    Knee flexion    Knee extension 21.9 17.8  Ankle dorsiflexion    Ankle plantarflexion    Ankle inversion    Ankle eversion     (Blank rows = not tested)   GAIT:  Gait: flexed trunk  Orthostatic B/P Seated 124/90 119/66  5x sit to stand 21.3    08/12/24 0001  Standardized Balance  Assessment  Standardized Balance Assessment Berg Balance Test  Berg Balance Test  Sit to Stand 3  Standing Unsupported 3  Sitting with Back Unsupported but Feet Supported on Floor or Stool 4  Stand to Sit 4  Transfers 4  Standing Unsupported with Eyes Closed 3  Standing Unsupported with Feet Together 3  From Standing, Reach Forward with Outstretched Arm 2  From Standing Position, Pick up Object from Floor 3  From Standing Position, Turn to Look Behind Over each Shoulder 2  Turn 360 Degrees 1  Standing Unsupported, Alternately Place Feet on Step/Stool 0  Standing Unsupported, One Foot in Front 0  Standing on One Leg 0  Total Score 32                                                                                                                                 TREATMENT DATE:   08/12/24  Lars   Nustep seat 5, L4x6 minutes BLEs only   Review of HEP- resisted hip flexion, resisted clams, resisted LAQs with red TB x10 each  STS with red TB above knees x10 from mat table  Education on HEP performance- 3 sets of 10 each exercise, 1x/day, discussed doing 3 sets of each before moving to next exercise  versus rotating through each exercise    PATIENT EDUCATION:  Education details: HEP, symptom management,  Person educated: Patient Education method: Explanation, Demonstration, Tactile cues, Verbal cues, and Handouts Education comprehension: verbalized understanding, returned demonstration, verbal cues required, tactile cues required, and needs further education  HOME EXERCISE PROGRAM: Access Code: [email or web address] URL: [email or web address] Date: 07/08/2024 Prepared by: Alm Don  ASSESSMENT:  CLINICAL IMPRESSION:   Arrives today doing OK, unfortunately she had multiple falls and some illnesses between now and last time she was seen for her eval. We assessed the Lars, otherwise reprinted and reviewed HEP today- she had not been doing it as of yet due to ongoing  illness in the past month. Her left knee really buckles quite a bit during transfers and with gait without BUE support, likely contributing to high fall risk. Encouraged getting checked out medically when she hits her head in any falls due to risk of intracranial bleeds.     EVAL: Patient is a 83 year old female who presents to physical therapy following frequent falls.  She has bilateral lower extremity weakness.  Has baseline deficits in static balance.  Therapy will perform more extensive balance test next visit.  She has a history of lower back pain.  Without the device she requires contact-guard assist to minimum assist to transfer.  Her most recent fall was 2 weeks ago.  Her frequent falls are limiting her overall mobility.  She would benefit from skilled therapy to improve her safety. OBJECTIVE IMPAIRMENTS: Abnormal gait, decreased activity tolerance, decreased balance, difficulty walking, decreased ROM, decreased strength, impaired sensation, and pain.   ACTIVITY LIMITATIONS: carrying, lifting, bending, standing, squatting, stairs, transfers, and locomotion level  PARTICIPATION LIMITATIONS: meal prep, cleaning, laundry, shopping, and community activity  PERSONAL FACTORS: 3+ comorbidities: RA, multple neck and back fusions  left knee replacement, PMR  are also affecting patient's functional outcome.   REHAB POTENTIAL: Good  CLINICAL DECISION MAKING: Stable/uncomplicated  EVALUATION COMPLEXITY: Low   GOALS: Goals reviewed with patient? Yes  SHORT TERM GOALS: Target date: 08/06/2024    Patient will stand with narrow base with SBA Baseline: Goal status: MET 08/12/24 during Berg   2.  Patient will increase gross bilateral LE strength by 5 lbs  Baseline:  Goal status: ONGOING 08/12/24  3.  Patient will decrease 5x sit to stand test by 5 sec  Baseline:  Goal status: ONGOING 08/12/24  4.  Patient will perfrom BERG balance testing Baseline:  Goal status: MET 08/12/24  LONG  TERM GOALS: Target date: 09/02/2024    Patient will report no falls over a 1 month period prior to discharge  Baseline:  Goal status: INITIAL  2.  Patient will ambulate limited community distances without pain or loss of balance  Baseline:  Goal status: INITIAL  3.  Patient will have complete and safe home program for balance and gait Baseline:  Goal status: INITIAL   PLAN:  PT FREQUENCY: 2x/week  PT DURATION: 8 weeks  PLANNED INTERVENTIONS: [id number]-Therapeutic exercises, 97530- Therapeutic activity, [id number]- Neuromuscular re-education, 97535- Self Care, [id number]- Manual therapy, [phone]- Gait training, [phone]- Aquatic Therapy, Patient/Family education, Stair training, Taping, Dry Needling, DME instructions, Cryotherapy, and Moist heat   PLAN FOR NEXT SESSION:    At some point consider 3-minute walk test.  Begin baseline balance training.  Progress exercise program as tolerated. Is she doing better with HEP?   Josette Rough, PT, DPT 08/12/24 3:17 PM

## 2024-08-14 ENCOUNTER — Encounter: Payer: Self-pay | Admitting: Family Medicine

## 2024-08-14 ENCOUNTER — Other Ambulatory Visit (HOSPITAL_COMMUNITY): Payer: Self-pay

## 2024-08-14 ENCOUNTER — Ambulatory Visit (INDEPENDENT_AMBULATORY_CARE_PROVIDER_SITE_OTHER): Admitting: Family Medicine

## 2024-08-14 VITALS — BP 150/70 | HR 80 | Temp 98.1°F | Wt 123.8 lb

## 2024-08-14 DIAGNOSIS — R3915 Urgency of urination: Secondary | ICD-10-CM | POA: Diagnosis not present

## 2024-08-14 DIAGNOSIS — Z79899 Other long term (current) drug therapy: Secondary | ICD-10-CM | POA: Diagnosis not present

## 2024-08-14 DIAGNOSIS — R5383 Other fatigue: Secondary | ICD-10-CM | POA: Diagnosis not present

## 2024-08-14 DIAGNOSIS — I10 Essential (primary) hypertension: Secondary | ICD-10-CM

## 2024-08-14 DIAGNOSIS — R296 Repeated falls: Secondary | ICD-10-CM

## 2024-08-14 DIAGNOSIS — R42 Dizziness and giddiness: Secondary | ICD-10-CM

## 2024-08-14 LAB — BASIC METABOLIC PANEL WITH GFR
BUN: 19 mg/dL (ref 6–23)
CO2: 31 meq/L (ref 19–32)
Calcium: 8.3 mg/dL — ABNORMAL LOW (ref 8.4–10.5)
Chloride: 103 meq/L (ref 96–112)
Creatinine, Ser: 1.18 mg/dL (ref 0.40–1.20)
GFR: 42.48 mL/min — ABNORMAL LOW (ref >60.00)
Glucose, Bld: 90 mg/dL (ref 70–99)
Potassium: 4.3 meq/L (ref 3.5–5.1)
Sodium: 141 meq/L (ref 135–145)

## 2024-08-14 MED ORDER — GEMTESA 75 MG PO TABS: 75.0000 mg | ORAL_TABLET | Freq: Every day | ORAL | 5 refills | Status: AC

## 2024-08-14 NOTE — Progress Notes (Signed)
 Established Patient Office Visit  Subjective   Patient ID: [name], female    DOB: Mar 10, 1941  Age: 83 y.o. MRN: [id number]  Chief Complaint  Patient presents with   Medical Management of Chronic Issues    HPI    [name] seen for medical follow-up.  She has multiple chronic problems including remote history of temporal arteritis, history of TIA, hypertension, pulmonary fibrosis, chronic low back pain, chronic renal insufficiency, past history of breast cancer, history of CVA.  Refer to last note for details.  She was seen with orthostasis with blood pressure dropping around 20 points systolic with standing.  We discontinued her amlodipine  and switched to losartan  50 mg daily.  Also scaled back her furosemide  from 40 to 20 mg daily.  She has seen less dizziness since making this change.  Still having fatigue.  She is still having frequent falls.  She just recently started back physical therapy last Wednesday.  She is having significant urine urgency and has had so for some time.  She states she frequently goes to the bathroom about every hour at night.  No burning with urination.  She has a sense of urgency.  Usually drinks 6 to 7 glasses of tea per day.  Past Medical History:  Diagnosis Date   Allergic rhinitis    Anemia    Anxiety    Bronchiectasis    Carpal tunnel syndrome 07/13/2015   Bilateral   CVA (cerebral infarction) 10/2007   Right thalamic    Diverticulosis of colon    Gait disorder    GERD (gastroesophageal reflux disease)    History of breast cancer    HTN (hypertension)    Hyperlipidemia    Idiopathic pulmonary fibrosis    Left knee DJD    Migraine    Osteoporosis    Peripheral neuropathy    PMR (polymyalgia rheumatica)    Polyneuropathy in other diseases classified elsewhere 02/17/2013   Previous back surgery 10/09/12   Renal insufficiency    Rheumatoid arthritis (HCC)    Seizure disorder (HCC)    Spondylosis, cervical, with myelopathy 08/31/2015    C3-4 myelopathy   Temporal arteritis (HCC)    Right eye blind, 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   Type II or unspecified type diabetes mellitus without mention of complication, not stated as uncontrolled    2nd to steriods   Vitamin D deficiency    Past Surgical History:  Procedure Laterality Date   APPENDECTOMY  01/2021   CARPAL TUNNEL RELEASE Right    CATARACT EXTRACTION     OS - Summer 2010   CERVICAL FUSION  09/24/2008   4 rods and pins in place   CHOLECYSTECTOMY     COLONOSCOPY  12/15/2011   Procedure: COLONOSCOPY;  Surgeon: Renaye Sous, MD;  Location: [facility];  Service: Endoscopy;  Laterality: N/A;   LUMBAR FUSION  04/24/2010   W/Mechanical fixation - L2-5 [facility]   LUMBAR FUSION  09/25/2011   rods in hips (to stabilize).   MASTECTOMY     NECK SURGERY     rheumatoid nodule removal     SPINAL FUSION  07/25/2010   T10-L2 interbody fusion / [facility]   TONSILLECTOMY     TOTAL KNEE ARTHROPLASTY     Left    reports that she has never smoked. She has never used smokeless tobacco. She reports that she does not currently use alcohol. She reports that she does not use drugs. family history includes Arthritis  in her father and mother; Brain cancer in her mother; Breast cancer in her sister; Dementia in her father; Hypertension in her father and mother; Lung cancer in her sister. Allergies  Allergen Reactions   Atorvastatin  Nausea Only and Other (See Comments)    Dizzyness.    Hydromorphone Other (See Comments)    Cognitive changes  made me unconscious Dilaudid    Review of Systems  Constitutional:  Positive for malaise/fatigue. Negative for chills and fever.  Respiratory:  Negative for cough and shortness of breath.   Cardiovascular:  Negative for chest pain.  Gastrointestinal:  Negative for abdominal pain.  Genitourinary:  Positive for urgency. Negative for dysuria, flank pain and hematuria.  Neurological:  Negative for dizziness.       Objective:     BP (!) 150/70   Pulse 80   Temp 98.1 F (36.7 C) (Oral)   Wt 123 lb 12.8 oz (56.2 kg)   SpO2 94%   BMI 23.39 kg/m  BP Readings from Last 3 Encounters:  08/14/24 (!) 150/70  08/07/24 100/60  07/17/24 120/70   Wt Readings from Last 3 Encounters:  08/14/24 123 lb 12.8 oz (56.2 kg)  08/07/24 119 lb (54 kg)  07/17/24 119 lb 11.2 oz (54.3 kg)      Physical Exam Vitals reviewed.  Constitutional:      General: She is not in acute distress.    Appearance: She is not ill-appearing or toxic-appearing.  Cardiovascular:     Rate and Rhythm: Normal rate and regular rhythm.  Pulmonary:     Effort: Pulmonary effort is normal.     Breath sounds: Normal breath sounds. No wheezing or rales.  Neurological:     General: No focal deficit present.     Mental Status: She is alert.      No results found for any visits on 08/14/24.  Last CBC Lab Results  Component Value Date   WBC 10.7 (H) 05/20/2024   HGB 13.8 05/20/2024   HCT 43.1 05/20/2024   MCV 97.1 05/20/2024   MCH 31.1 05/20/2024   RDW 14.4 05/20/2024   PLT 249 05/20/2024   Last metabolic panel Lab Results  Component Value Date   GLUCOSE 95 05/20/2024   NA 142 05/20/2024   K 4.0 05/20/2024   CL 102 05/20/2024   CO2 25 05/20/2024   BUN 14 05/20/2024   CREATININE 1.28 (H) 05/20/2024   EGFR 45.0 08/06/2024   CALCIUM  10.0 05/20/2024   PHOS 2.0 (L) 08/02/2022   PROT 6.8 11/19/2023   ALBUMIN 4.1 11/19/2023   LABGLOB 2.7 11/19/2023   BILITOT 1.2 11/19/2023   ALKPHOS 80 11/19/2023   AST 23 11/19/2023   ALT 11 11/19/2023   ANIONGAP 15 05/20/2024   Last lipids Lab Results  Component Value Date   CHOL 143 08/03/2022   HDL 47 08/03/2022   LDLCALC 85 08/03/2022   TRIG 56 08/03/2022   CHOLHDL 3.0 08/03/2022   Last hemoglobin A1c Lab Results  Component Value Date   HGBA1C 5.0 08/03/2022   Last thyroid  functions Lab Results  Component Value Date   TSH 2.490 11/19/2023   Last vitamin B12 and  Folate Lab Results  Component Value Date   VITAMINB12 >2000 (H) 02/25/2024   FOLATE  11/03/2007    10.0 (NOTE)  Reference Ranges        Deficient:       0.4 - 3.4 ng/mL        Indeterminate:   3.4 - 5.4 ng/mL  Normal:              > 5.4 ng/mL      The ASCVD Risk score (Arnett DK, et al., 2019) failed to calculate for the following reasons:   The 2019 ASCVD risk score is only valid for ages 26 to 69    Assessment & Plan:   #1 hypertension.  She recently had some consistent and repeated orthostatic symptoms with documented drop in systolic blood pressure about 20 points here last visit.  We discontinued amlodipine  and started losartan  50 mg daily.  Also scaled back her Lasix  from 40 to 20 mg.  Symptoms have improved some.  Blood pressure was up slightly today 142/70 right arm seated at rest and standing 140/70.  Check basic metabolic panel with recent initiation of losartan .  Follow-up immediately for any recurrent dizziness.  Set up follow-up in about 3 months to reassess  #2 urine urgency.  She is having symptoms day and night and very disruptive to sleep.  Suspect this is contributing significantly to her fatigue issues.  Avoid excessive fluids at night.  Gradually scaled back caffeine.  Consider trial of Gemtesa  75 mg once daily if insurance will cover.  Avoid anticholinergic medications with her chronic opioids as she has very high risk for constipation.  #3 history of frequent falls.  Patient ambulates with walker.  Currently getting PT.   Return in about 3 months (around 11/14/2024).    Wolm Scarlet, MD

## 2024-08-15 ENCOUNTER — Ambulatory Visit: Payer: Self-pay | Admitting: Family Medicine

## 2024-08-16 LAB — DRUG MONITORING, PANEL 8 WITH CONFIRMATION, URINE
6 Acetylmorphine: NEGATIVE ng/mL (ref <10)
Alcohol Metabolites: NEGATIVE ng/mL (ref <500)
Amphetamines: NEGATIVE ng/mL (ref <500)
Benzodiazepines: NEGATIVE ng/mL (ref <100)
Buprenorphine, Urine: NEGATIVE ng/mL (ref <5)
Cocaine Metabolite: NEGATIVE ng/mL (ref <150)
Codeine: NEGATIVE ng/mL (ref <50)
Creatinine: 54.9 mg/dL (ref >=20.0)
Hydrocodone: NEGATIVE ng/mL (ref <50)
Hydromorphone: NEGATIVE ng/mL (ref <50)
MDMA: NEGATIVE ng/mL (ref <500)
Marijuana Metabolite: NEGATIVE ng/mL (ref <20)
Morphine: NEGATIVE ng/mL (ref <50)
Norhydrocodone: NEGATIVE ng/mL (ref <50)
Noroxycodone: 1547 ng/mL — ABNORMAL HIGH (ref <50)
Opiates: NEGATIVE ng/mL (ref <100)
Oxidant: NEGATIVE ug/mL (ref <200)
Oxycodone: 565 ng/mL — ABNORMAL HIGH (ref <50)
Oxycodone: POSITIVE ng/mL — AB (ref <100)
Oxymorphone: 605 ng/mL — ABNORMAL HIGH (ref <50)
pH: 7 (ref 4.5–9.0)

## 2024-08-16 LAB — DM TEMPLATE

## 2024-08-17 ENCOUNTER — Telehealth: Payer: Self-pay

## 2024-08-17 ENCOUNTER — Other Ambulatory Visit (HOSPITAL_COMMUNITY): Payer: Self-pay

## 2024-08-17 NOTE — Telephone Encounter (Signed)
 Pharmacy Patient Advocate Encounter   Received notification from Onbase that prior authorization for Gemtesa  75 tabs is required/requested.   Insurance verification completed.   The patient is insured through  [employer].   Per test claim: PA required; PA submitted to above mentioned insurance via Latent Key/confirmation #/EOC [id number] Status is pending

## 2024-08-17 NOTE — Telephone Encounter (Signed)
 Pharmacy Patient Advocate Encounter  Received notification from [employer] that Prior Authorization for Gemtesa  75 has been APPROVED from 07/18/24 to 09/23/98. Ran test claim, Copay is $43.00. This test claim was processed through [facility]- copay amounts may vary at other pharmacies due to pharmacy/plan contracts, or as the patient moves through the different stages of their insurance plan.   PA #/Case ID/Reference #: # [id number]

## 2024-08-18 ENCOUNTER — Ambulatory Visit (HOSPITAL_BASED_OUTPATIENT_CLINIC_OR_DEPARTMENT_OTHER): Admitting: Physical Therapy

## 2024-08-18 DIAGNOSIS — R293 Abnormal posture: Secondary | ICD-10-CM | POA: Diagnosis not present

## 2024-08-18 DIAGNOSIS — M5459 Other low back pain: Secondary | ICD-10-CM | POA: Diagnosis not present

## 2024-08-18 DIAGNOSIS — M6281 Muscle weakness (generalized): Secondary | ICD-10-CM | POA: Diagnosis not present

## 2024-08-18 DIAGNOSIS — R2681 Unsteadiness on feet: Secondary | ICD-10-CM | POA: Diagnosis not present

## 2024-08-18 DIAGNOSIS — R2689 Other abnormalities of gait and mobility: Secondary | ICD-10-CM | POA: Diagnosis not present

## 2024-08-18 NOTE — Therapy (Signed)
 OUTPATIENT PHYSICAL THERAPY LOWER EXTREMITY TREATMENT    Patient Name: [name] MRN: [id number] DOB:06-23-1941, 83 y.o., female Today's Date: 08/19/2024  END OF SESSION:  PT End of Session - 08/19/24 0811     Visit Number 3    Number of Visits 16    Date for Recertification  09/02/24    PT Start Time 1345    PT Stop Time 1428    PT Time Calculation (min) 43 min    Activity Tolerance Patient tolerated treatment well    Behavior During Therapy [facility] for tasks assessed/performed            Past Medical History:  Diagnosis Date   Allergic rhinitis    Anemia    Anxiety    Bronchiectasis    Carpal tunnel syndrome 07/13/2015   Bilateral   CVA (cerebral infarction) 10/2007   Right thalamic    Diverticulosis of colon    Gait disorder    GERD (gastroesophageal reflux disease)    History of breast cancer    HTN (hypertension)    Hyperlipidemia    Idiopathic pulmonary fibrosis    Left knee DJD    Migraine    Osteoporosis    Peripheral neuropathy    PMR (polymyalgia rheumatica)    Polyneuropathy in other diseases classified elsewhere 02/17/2013   Previous back surgery 10/09/12   Renal insufficiency    Rheumatoid arthritis (HCC)    Seizure disorder (HCC)    Spondylosis, cervical, with myelopathy 08/31/2015   C3-4 myelopathy   Temporal arteritis (HCC)    Right eye blind, 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   Type II or unspecified type diabetes mellitus without mention of complication, not stated as uncontrolled    2nd to steriods   Vitamin D deficiency    Past Surgical History:  Procedure Laterality Date   APPENDECTOMY  01/2021   CARPAL TUNNEL RELEASE Right    CATARACT EXTRACTION     OS - Summer 2010   CERVICAL FUSION  09/24/2008   4 rods and pins in place   CHOLECYSTECTOMY     COLONOSCOPY  12/15/2011   Procedure: COLONOSCOPY;  Surgeon: Renaye Sous, MD;  Location: [facility];  Service: Endoscopy;  Laterality: N/A;   LUMBAR FUSION  04/24/2010    W/Mechanical fixation - L2-5 [facility]   LUMBAR FUSION  09/25/2011   rods in hips (to stabilize).   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 08/25/2019   Pain management 10/30/2017   Rheumatoid arthritis (HCC) 04/25/2017   S/P cervical spinal fusion 09/05/2015   Spondylosis, cervical, with myelopathy 08/31/2015   Carpal tunnel syndrome 07/13/2015   Torn ear lobe 06/07/2015   Obesity (BMI 30-39.9) 04/06/2014   Shortness of breath 07/14/2013   Bone cyst 07/14/2013   Polyneuropathy in other diseases classified elsewhere 02/17/2013  Chronic back pain 12/09/2012   Peripheral edema 09/25/2012   Anemia due to chronic illness 09/01/2012   DDD (degenerative disc disease), lumbosacral 08/12/2012   Cervicalgia 07/11/2012   Disturbance of skin sensation 07/11/2012   Temporal arteritis (HCC) 04/01/2012   Lower GI bleed 12/24/2011   Rectal bleed 12/13/2011   Arthrodesis status 09/04/2011   Chronic renal insufficiency, stage II (mild) 04/04/2011   LUMBAR RADICULOPATHY, LEFT 04/18/2009   Essential hypertension 12/30/2007   SEIZURE DISORDER 12/30/2007   CEREBROVASCULAR ACCIDENT, HX OF 12/30/2007   Hyperlipidemia 10/08/2007   GERD 10/08/2007   DIVERTICULOSIS, COLON 10/08/2007   BRONCHIECTASIS 06/26/2007   PULMONARY FIBROSIS 05/17/2007   Osteoporosis 05/17/2007   BREAST CANCER, HX OF 05/17/2007   MASTECTOMY,  LEFT, HX OF 05/17/2007    PCP: Dr Wolm Blunt   REFERRING PROVIDER: Dr Wolm Jobs   REFERRING DIAG:  Diagnosis  R29.6 (ICD-10-CM) - Frequent falls    THERAPY DIAG:  Other abnormalities of gait and mobility  Unsteadiness on feet  Abnormal posture  Muscle weakness (generalized)  Other low back pain  Rationale for Evaluation and Treatment: Rehabilitation  ONSET DATE:  Has had more falls over the past 6 months   SUBJECTIVE:   SUBJECTIVE STATEMENT: The patient reports she has not fell in the past few days. She has been feeling pretty good.  After PT eval, I started not feeling good and falling more. Went to the doctor, they said to take it easy for awhile and then when I felt better come back to PT. Had to be on antibiotics for a sore throat recently. I fall a lot and I've hit my head a couple of time since last time I was seen here.   Eval: The patient has a long history of falls. The frequency has increased over the past few months. She has fallen 6x over the past 6 months . Her falls have come for a variety of reasons. She has hit her head with several of the falls. At times she feels like her legs give out. She uses a Rolator times but when she falls she is not using it at times.  She has an extensive past medical history of lumbar spine surgeries as well as PMR and RA which also likely contributed to falls.  PERTINENT HISTORY: RA, Left knee replacement, temporal arteritis, C-4-c4 fusion  Lumbar spine fusion t-10-L2, polyneuropathy PMR, migraine, history of breat cancer, gait disorder, frequent falls, anxiety, anemia, DMII,  PAIN:  Are you having pain?  NPRS scale: 7/10 when it happens, not constant  Pain location: wrapping around R lower ribs  Pain description: very sore Aggravating factors: turning L  Relieving factors:  rest   PRECAUTIONS: Fall  RED FLAGS: None   WEIGHT BEARING RESTRICTIONS: No  FALLS:  Has patient fallen in last 6 months? Yes.  Number of falls 6  LIVING ENVIRONMENT: Has a ramp into the house,  OCCUPATION:  Retired  Hobbies:  Likes to walk   PLOF: Independent  PATIENT GOALS:  To decrease the amount of falls she has  NEXT MD VISIT:    OBJECTIVE:  Note: Objective measures were completed at Evaluation unless otherwise noted.  DIAGNOSTIC FINDINGS:    PATIENT SURVEYS:  LEFS  Extreme difficulty/unable (0), Quite a bit of difficulty (1), Moderate difficulty (2), Little difficulty (3), No difficulty (4) Survey date:    Any of your usual work, housework or school activities   2. Usual hobbies, recreational or sporting activities   3. Getting  into/out of the bath   4. Walking between rooms   5. Putting on socks/shoes   6. Squatting    7. Lifting an object, like a bag of groceries from the floor   8. Performing light activities around your home   9. Performing heavy activities around your home   10. Getting into/out of a car   11. Walking 2 blocks   12. Walking 1 mile   13. Going up/down 10 stairs (1 flight)   14. Standing for 1 hour   15.  sitting for 1 hour   16. Running on even ground   17. Running on uneven ground   18. Making sharp turns while running fast   19. Hopping    20. Rolling over in bed   Score total:  28/80     COGNITION: Overall cognitive status: Within functional limits for tasks assessed  Feels like her memory is getting a little worse    SENSATION: Numbness in bilateral feet      POSTURE: flexed trunk   PALPATION:  No significant tenderness to palpation in the back at this time    (Blank rows = not tested)  LOWER EXTREMITY MMT:  MMT Right eval Left eval  Hip flexion 16.9 16.3  Hip extension    Hip abduction 13.2 16.6  Hip adduction    Hip internal rotation    Hip external rotation    Knee flexion    Knee extension 21.9 17.8  Ankle dorsiflexion    Ankle plantarflexion    Ankle inversion    Ankle eversion     (Blank rows = not  tested)   GAIT:  Gait: flexed trunk  Orthostatic B/P Seated 124/90 119/66  5x sit to stand 21.3    08/12/24 0001  Standardized Balance Assessment  Standardized Balance Assessment Berg Balance Test  Berg Balance Test  Sit to Stand 3  Standing Unsupported 3  Sitting with Back Unsupported but Feet Supported on Floor or Stool 4  Stand to Sit 4  Transfers 4  Standing Unsupported with Eyes Closed 3  Standing Unsupported with Feet Together 3  From Standing, Reach Forward with Outstretched Arm 2  From Standing Position, Pick up Object from Floor 3  From Standing Position, Turn to Look Behind Over each Shoulder 2  Turn 360 Degrees 1  Standing Unsupported, Alternately Place Feet on Step/Stool 0  Standing Unsupported, One Foot in Front 0  Standing on One Leg 0  Total Score 32                                                                                                                                 TREATMENT DATE:  08/18/24   There-act:  Sit to stand 5x3   Neuromuscular reeducation: Heel toe rock 3 x 10 Slow march for balance 3 x 10 Narrow base of support 2 x 20 eyes open 2 x 20 eyes closed Tandem stance  2 x 20 each leg  TherEX: Row machine life fitness 3 x 1010 pounds NuStep 5 minutes level 3  08/12/24  Berg   Nustep seat 5, L4x6 minutes BLEs only   Review of HEP- resisted hip flexion, resisted clams, resisted LAQs with red TB x10 each  STS with red TB above knees x10 from mat table  Education on HEP performance- 3 sets of 10 each exercise, 1x/day, discussed doing 3 sets of each before moving to next exercise  versus rotating through each exercise    PATIENT EDUCATION:  Education details: HEP, symptom management,  Person educated: Patient Education method: Explanation, Demonstration, Tactile cues, Verbal cues, and Handouts Education comprehension: verbalized understanding, returned demonstration, verbal cues required, tactile cues required, and needs further  education  HOME EXERCISE PROGRAM: Access Code: [email or web address] URL: [email or web address] Date: 07/08/2024 Prepared by: Alm Don  ASSESSMENT:  CLINICAL IMPRESSION: Patient tolerated treatment well.  She reports she felt good with the treatment.  Will continue to monitor her symptoms.  After the initial visit she had reported more falls.  She is not sure if is related to the actual visit.  Will monitor her symptoms.  She was interested in trying gym machines.  We trialed her on the rowing machine.  She tolerated well.  She reported feeling fatigue but no significant pain.  We also worked on balance activities.  At this time she was advised not to do balance activities on her own.  Will continue to practice here until she feels safe working on basic balance exercises at home.   EVAL: Patient is a 83 year old female who presents to physical therapy following frequent falls.  She has bilateral lower extremity weakness.  Has baseline deficits in static balance.  Therapy will perform more extensive balance test next visit.  She has a history of lower back pain.  Without the device she requires contact-guard assist to minimum assist to transfer.  Her most recent fall was 2 weeks ago.  Her frequent falls are limiting her overall mobility.  She would benefit from skilled therapy to improve her safety. OBJECTIVE IMPAIRMENTS: Abnormal gait, decreased activity tolerance, decreased balance, difficulty walking, decreased ROM, decreased strength, impaired sensation, and pain.   ACTIVITY LIMITATIONS: carrying, lifting, bending, standing, squatting, stairs, transfers, and locomotion level  PARTICIPATION LIMITATIONS: meal prep, cleaning, laundry, shopping, and community activity  PERSONAL FACTORS: 3+ comorbidities: RA, multple neck and back fusions  left knee replacement, PMR  are also affecting patient's functional outcome.   REHAB POTENTIAL: Good  CLINICAL DECISION MAKING:  Stable/uncomplicated  EVALUATION COMPLEXITY: Low   GOALS: Goals reviewed with patient? Yes  SHORT TERM GOALS: Target date: 08/06/2024    Patient will stand with narrow base with SBA Baseline: Goal status: MET 08/12/24 during Berg   2.  Patient will increase gross bilateral LE strength by 5 lbs  Baseline:  Goal status: ONGOING 08/12/24  3.  Patient will decrease 5x sit to stand test by 5 sec  Baseline:  Goal status: ONGOING 08/12/24  4.  Patient will perfrom BERG balance testing Baseline:  Goal status: MET 08/12/24  LONG TERM GOALS: Target date: 09/02/2024    Patient will report no falls over a 1 month period prior to discharge  Baseline:  Goal status: INITIAL  2.  Patient will ambulate limited community distances without pain or loss of balance  Baseline:  Goal status: INITIAL  3.  Patient will have complete and safe home program for balance and gait Baseline:  Goal status: INITIAL  PLAN:  PT FREQUENCY: 2x/week  PT DURATION: 8 weeks  PLANNED INTERVENTIONS: [id number]-Therapeutic exercises, 97530- Therapeutic activity, [id number]- Neuromuscular re-education, 97535- Self Care, [id number]- Manual therapy, [phone]- Gait training, [phone]- Aquatic Therapy, Patient/Family education, Stair training, Taping, Dry Needling, DME instructions, Cryotherapy, and Moist heat   PLAN FOR NEXT SESSION:    At some point consider 3-minute walk test.  Begin baseline balance training.  Progress exercise program as tolerated. Is she doing better with HEP?   Josette Rough, PT, DPT 08/19/24 8:13 AM

## 2024-08-19 ENCOUNTER — Encounter (HOSPITAL_BASED_OUTPATIENT_CLINIC_OR_DEPARTMENT_OTHER): Payer: Self-pay | Admitting: Physical Therapy

## 2024-08-25 ENCOUNTER — Ambulatory Visit (HOSPITAL_BASED_OUTPATIENT_CLINIC_OR_DEPARTMENT_OTHER): Admitting: Physical Therapy

## 2024-08-26 ENCOUNTER — Ambulatory Visit (HOSPITAL_BASED_OUTPATIENT_CLINIC_OR_DEPARTMENT_OTHER): Attending: Family Medicine | Admitting: Physical Therapy

## 2024-08-26 ENCOUNTER — Encounter (HOSPITAL_BASED_OUTPATIENT_CLINIC_OR_DEPARTMENT_OTHER): Payer: Self-pay | Admitting: Physical Therapy

## 2024-08-26 DIAGNOSIS — M6281 Muscle weakness (generalized): Secondary | ICD-10-CM | POA: Diagnosis present

## 2024-08-26 DIAGNOSIS — R293 Abnormal posture: Secondary | ICD-10-CM | POA: Diagnosis present

## 2024-08-26 DIAGNOSIS — R2689 Other abnormalities of gait and mobility: Secondary | ICD-10-CM | POA: Diagnosis present

## 2024-08-26 DIAGNOSIS — R2681 Unsteadiness on feet: Secondary | ICD-10-CM | POA: Insufficient documentation

## 2024-08-26 DIAGNOSIS — M5459 Other low back pain: Secondary | ICD-10-CM | POA: Diagnosis present

## 2024-08-26 NOTE — Therapy (Signed)
 OUTPATIENT PHYSICAL THERAPY LOWER EXTREMITY TREATMENT    Patient Name: [name] MRN: [id number] DOB:17-Dec-1940, 83 y.o., female Today's Date: 08/26/2024  END OF SESSION:  PT End of Session - 08/26/24 1454     Visit Number 4    Number of Visits 16    Date for Recertification  09/02/24    PT Start Time 1435    PT Stop Time 1513    PT Time Calculation (min) 38 min    Activity Tolerance Patient tolerated treatment well    Behavior During Therapy [facility] for tasks assessed/performed             Past Medical History:  Diagnosis Date   Allergic rhinitis    Anemia    Anxiety    Bronchiectasis    Carpal tunnel syndrome 07/13/2015   Bilateral   CVA (cerebral infarction) 10/2007   Right thalamic    Diverticulosis of colon    Gait disorder    GERD (gastroesophageal reflux disease)    History of breast cancer    HTN (hypertension)    Hyperlipidemia    Idiopathic pulmonary fibrosis    Left knee DJD    Migraine    Osteoporosis    Peripheral neuropathy    PMR (polymyalgia rheumatica)    Polyneuropathy in other diseases classified elsewhere 02/17/2013   Previous back surgery 10/09/12   Renal insufficiency    Rheumatoid arthritis (HCC)    Seizure disorder (HCC)    Spondylosis, cervical, with myelopathy 08/31/2015   C3-4 myelopathy   Temporal arteritis (HCC)    Right eye blind, 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   Type II or unspecified type diabetes mellitus without mention of complication, not stated as uncontrolled    2nd to steriods   Vitamin D deficiency    Past Surgical History:  Procedure Laterality Date   APPENDECTOMY  01/2021   CARPAL TUNNEL RELEASE Right    CATARACT EXTRACTION     OS - Summer 2010   CERVICAL FUSION  09/24/2008   4 rods and pins in place   CHOLECYSTECTOMY     COLONOSCOPY  12/15/2011   Procedure: COLONOSCOPY;  Surgeon: Renaye Sous, MD;  Location: [facility];  Service: Endoscopy;  Laterality: N/A;   LUMBAR FUSION  04/24/2010    W/Mechanical fixation - L2-5 [facility]   LUMBAR FUSION  09/25/2011   rods in hips (to stabilize).   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 08/25/2019   Pain management 10/30/2017   Rheumatoid arthritis (HCC) 04/25/2017   S/P cervical spinal fusion 09/05/2015   Spondylosis, cervical, with myelopathy 08/31/2015   Carpal tunnel syndrome 07/13/2015   Torn ear lobe 06/07/2015   Obesity (BMI 30-39.9) 04/06/2014   Shortness of breath 07/14/2013   Bone cyst 07/14/2013   Polyneuropathy in other diseases classified elsewhere  02/17/2013   Chronic back pain 12/09/2012   Peripheral edema 09/25/2012   Anemia due to chronic illness 09/01/2012   DDD (degenerative disc disease), lumbosacral 08/12/2012   Cervicalgia 07/11/2012   Disturbance of skin sensation 07/11/2012   Temporal arteritis (HCC) 04/01/2012   Lower GI bleed 12/24/2011   Rectal bleed 12/13/2011   Arthrodesis status 09/04/2011   Chronic renal insufficiency, stage II (mild) 04/04/2011   LUMBAR RADICULOPATHY, LEFT 04/18/2009   Essential hypertension 12/30/2007   SEIZURE DISORDER 12/30/2007   CEREBROVASCULAR ACCIDENT, HX OF 12/30/2007   Hyperlipidemia 10/08/2007   GERD 10/08/2007   DIVERTICULOSIS, COLON 10/08/2007   BRONCHIECTASIS 06/26/2007   PULMONARY FIBROSIS 05/17/2007   Osteoporosis 05/17/2007   BREAST CANCER, HX OF 05/17/2007   MASTECTOMY,  LEFT, HX OF 05/17/2007    PCP: Dr Wolm Blunt   REFERRING PROVIDER: Dr Wolm Jobs   REFERRING DIAG:  Diagnosis  R29.6 (ICD-10-CM) - Frequent falls    THERAPY DIAG:  Other abnormalities of gait and mobility  Unsteadiness on feet  Abnormal posture  Muscle weakness (generalized)  Rationale for Evaluation and Treatment: Rehabilitation  ONSET DATE:  Has had more falls over the past 6 months   SUBJECTIVE:   SUBJECTIVE STATEMENT:  I still haven't had any falls, I've been very careful recently.    Eval: The patient has a long history of falls. The frequency has increased over the past few months. She has fallen 6x over the past 6 months . Her falls have come for a variety of reasons. She has hit her head with several of the falls. At times she feels like her legs give out. She uses a Rolator times but when she falls she is not using it at times.  She has an extensive past medical history of lumbar spine surgeries as well as PMR and RA which also likely contributed to falls.  PERTINENT HISTORY: RA, Left knee replacement, temporal arteritis, C-4-c4 fusion  Lumbar spine fusion t-10-L2, polyneuropathy PMR, migraine, history of breat cancer, gait disorder, frequent falls, anxiety, anemia, DMII,  PAIN:  Are you having pain?  NPRS scale: 5/10 when it happens, not constant  Pain location: generalized  Pain description: aches and pains  Aggravating factors: nothing  Relieving factors:  nothing   PRECAUTIONS: Fall  RED FLAGS: None   WEIGHT BEARING RESTRICTIONS: No  FALLS:  Has patient fallen in last 6 months? Yes. Number of falls 6  LIVING ENVIRONMENT: Has a ramp into the house,  OCCUPATION:  Retired  Hobbies:  Likes to walk   PLOF: Independent  PATIENT GOALS:  To decrease the amount of falls she has  NEXT MD VISIT:    OBJECTIVE:  Note: Objective measures were completed at Evaluation unless otherwise noted.  DIAGNOSTIC FINDINGS:    PATIENT  SURVEYS:  LEFS  Extreme difficulty/unable (0), Quite a bit of difficulty (1), Moderate difficulty (2), Little difficulty (3), No difficulty (4) Survey date:    Any of your usual work, housework or school activities   2. Usual hobbies, recreational or sporting activities   3. Getting into/out of the bath   4. Walking between rooms   5. Putting on socks/shoes   6. Squatting    7. Lifting an object, like a bag of groceries from the floor   8. Performing light activities around your home   9. Performing heavy activities around your home   10. Getting into/out of a car   11. Walking 2 blocks   12. Walking 1 mile  13. Going up/down 10 stairs (1 flight)   14. Standing for 1 hour   15.  sitting for 1 hour   16. Running on even ground   17. Running on uneven ground   18. Making sharp turns while running fast   19. Hopping    20. Rolling over in bed   Score total:  28/80     COGNITION: Overall cognitive status: Within functional limits for tasks assessed  Feels like her memory is getting a little worse    SENSATION: Numbness in bilateral feet      POSTURE: flexed trunk   PALPATION:  No significant tenderness to palpation in the back at this time    (Blank rows = not tested)  LOWER EXTREMITY MMT:  MMT Right eval Left eval  Hip flexion 16.9 16.3  Hip extension    Hip abduction 13.2 16.6  Hip adduction    Hip internal rotation    Hip external rotation    Knee flexion    Knee extension 21.9 17.8  Ankle dorsiflexion    Ankle plantarflexion    Ankle inversion    Ankle eversion     (Blank rows = not tested)   GAIT:  Gait: flexed trunk  Orthostatic B/P Seated 124/90 119/66  5x sit to stand 21.3  08/26/24- with rollator 453ft, severe L knee buckling with fatigue    08/12/24 0001  Standardized Balance Assessment  Standardized Balance Assessment Berg Balance Test  Berg Balance Test  Sit to Stand 3  Standing Unsupported 3  Sitting with Back  Unsupported but Feet Supported on Floor or Stool 4  Stand to Sit 4  Transfers 4  Standing Unsupported with Eyes Closed 3  Standing Unsupported with Feet Together 3  From Standing, Reach Forward with Outstretched Arm 2  From Standing Position, Pick up Object from Floor 3  From Standing Position, Turn to Look Behind Over each Shoulder 2  Turn 360 Degrees 1  Standing Unsupported, Alternately Place Feet on Step/Stool 0  Standing Unsupported, One Foot in Front 0  Standing on One Leg 0  Total Score 32                                                                                                                                 TREATMENT DATE:   08/26/24  with rollator Nustep L3 seat 6 x8 minutes for conditioning  Tandem stance 1x30 seconds B  Education on peripheral neuropathy- make sure she is checking any foot wounds regularly, potential for recovery from neuropathy or not depending on causative factors  Bridges x10 Supine clams red TB x10 Supine hip ABD red TB at ankles x10 B SLRs 0# x10 B STS with L LE staggered slightly back x10      08/18/24   There-act:  Sit to stand 5x3   Neuromuscular reeducation: Heel toe rock 3 x 10 Slow march for balance 3 x 10  Narrow base of support 2 x 20 eyes open 2 x 20 eyes closed Tandem stance 2 x 20 each leg  TherEX: Row machine life fitness 3 x 1010 pounds NuStep 5 minutes level 3  08/12/24  Berg   Nustep seat 5, L4x6 minutes BLEs only   Review of HEP- resisted hip flexion, resisted clams, resisted LAQs with red TB x10 each  STS with red TB above knees x10 from mat table  Education on HEP performance- 3 sets of 10 each exercise, 1x/day, discussed doing 3 sets of each before moving to next exercise  versus rotating through each exercise    PATIENT EDUCATION:  Education details: HEP, symptom management,  Person educated: Patient Education method: Explanation, Demonstration, Tactile cues, Verbal cues, and  Handouts Education comprehension: verbalized understanding, returned demonstration, verbal cues required, tactile cues required, and needs further education  HOME EXERCISE PROGRAM: Access Code: [email or web address] URL: [email or web address] Date: 07/08/2024 Prepared by: Alm Don  ASSESSMENT:  CLINICAL IMPRESSION:  Arrives today doing well, we took initial measure on - distance limited by fatigue and L knee buckling-, otherwise continued working on functional LE strength and conditioning per her request. Her left knee really buckles significantly with fatigue, PT had to stop her prior to end of due to concern of fall. Doing well, still has not had any falls since starting PT. Education as above.    EVAL: Patient is a 83 year old female who presents to physical therapy following frequent falls.  She has bilateral lower extremity weakness.  Has baseline deficits in static balance.  Therapy will perform more extensive balance test next visit.  She has a history of lower back pain.  Without the device she requires contact-guard assist to minimum assist to transfer.  Her most recent fall was 2 weeks ago.  Her frequent falls are limiting her overall mobility.  She would benefit from skilled therapy to improve her safety. OBJECTIVE IMPAIRMENTS: Abnormal gait, decreased activity tolerance, decreased balance, difficulty walking, decreased ROM, decreased strength, impaired sensation, and pain.   ACTIVITY LIMITATIONS: carrying, lifting, bending, standing, squatting, stairs, transfers, and locomotion level  PARTICIPATION LIMITATIONS: meal prep, cleaning, laundry, shopping, and community activity  PERSONAL FACTORS: 3+ comorbidities: RA, multple neck and back fusions  left knee replacement, PMR  are also affecting patient's functional outcome.   REHAB POTENTIAL: Good  CLINICAL DECISION MAKING: Stable/uncomplicated  EVALUATION COMPLEXITY: Low   GOALS: Goals reviewed with patient?  Yes  SHORT TERM GOALS: Target date: 08/06/2024    Patient will stand with narrow base with SBA Baseline: Goal status: MET 08/12/24 during Berg   2.  Patient will increase gross bilateral LE strength by 5 lbs  Baseline:  Goal status: ONGOING 08/12/24  3.  Patient will decrease 5x sit to stand test by 5 sec  Baseline:  Goal status: ONGOING 08/12/24  4.  Patient will perfrom BERG balance testing Baseline:  Goal status: MET 08/12/24  LONG TERM GOALS: Target date: 09/02/2024    Patient will report no falls over a 1 month period prior to discharge  Baseline:  Goal status: INITIAL  2.  Patient will ambulate limited community distances without pain or loss of balance  Baseline:  Goal status: INITIAL  3.  Patient will have complete and safe home program for balance and gait Baseline:  Goal status: INITIAL   PLAN:  PT FREQUENCY: 2x/week  PT DURATION: 8 weeks  PLANNED INTERVENTIONS: 97110-Therapeutic exercises, 97530- Therapeutic activity, [id number]- Neuromuscular re-education, 97535- Self Care, [id number]- Manual therapy, [id number]-  Gait training, [id number]- Aquatic Therapy, Patient/Family education, Stair training, Taping, Dry Needling, DME instructions, Cryotherapy, and Moist heat   PLAN FOR NEXT SESSION:   Continue balance training.  Progress exercise program as tolerated. Is she doing better with HEP?   Josette Rough, PT, DPT 08/26/24 3:13 PM

## 2024-08-27 ENCOUNTER — Ambulatory Visit: Admitting: Podiatry

## 2024-08-27 ENCOUNTER — Encounter: Payer: Self-pay | Admitting: Podiatry

## 2024-08-27 DIAGNOSIS — L97421 Non-pressure chronic ulcer of left heel and midfoot limited to breakdown of skin: Secondary | ICD-10-CM | POA: Diagnosis not present

## 2024-08-27 DIAGNOSIS — M79674 Pain in right toe(s): Secondary | ICD-10-CM

## 2024-08-27 DIAGNOSIS — B351 Tinea unguium: Secondary | ICD-10-CM

## 2024-08-27 DIAGNOSIS — M79675 Pain in left toe(s): Secondary | ICD-10-CM | POA: Diagnosis not present

## 2024-08-27 MED ORDER — CADEXOMER IODINE 0.9 % EX GEL: 1.0000 | Freq: Every day | CUTANEOUS | 0 refills | Status: AC | PRN

## 2024-08-27 NOTE — Progress Notes (Signed)
 Subjective:   Patient ID: [name], female   DOB: 83 y.o.   MRN: [id number]   HPI Patient has a ulceration of the left midfoot localized that she states has been in this statement for the last few months and does not seem to be worsening and elongated nailbeds 1-5 both feet that she cannot take care of   ROS      Objective:  Physical Exam  No change in neurovascular status severe collapse medial longitudinal arch with history of bone resection which improved her condition but gradually over the last few months she has developed a small ulceration of the plantar left arch with thick yellow brittle nailbeds also noted.  There is no erythema edema surrounding it no odor no drainage      Assessment:  Localized ulceration left with no apparent systemic infection and mycotic nail infection 1-5 both feet     Plan:  Debridement of plantar ulcer left that measures around 7 x 7 mm with flush applied Iodosorb with sterile dressing instructed on wet-to-dry dressings and Iodosorb application daily and debrided nailbeds 1-5 both feet Neutra genic bleeding reappoint routine care or if any issues were to occur with the ulceration it should heal uneventfully

## 2024-08-28 ENCOUNTER — Encounter (HOSPITAL_BASED_OUTPATIENT_CLINIC_OR_DEPARTMENT_OTHER): Admitting: Physical Therapy

## 2024-08-28 ENCOUNTER — Telehealth: Payer: Self-pay | Admitting: Neurology

## 2024-08-28 NOTE — Telephone Encounter (Signed)
 Pt r/s due to a conflict, pt on wait list

## 2024-08-31 ENCOUNTER — Ambulatory Visit: Admitting: Neurology

## 2024-09-01 ENCOUNTER — Ambulatory Visit (HOSPITAL_BASED_OUTPATIENT_CLINIC_OR_DEPARTMENT_OTHER): Admitting: Physical Therapy

## 2024-09-03 ENCOUNTER — Ambulatory Visit (HOSPITAL_BASED_OUTPATIENT_CLINIC_OR_DEPARTMENT_OTHER): Admitting: Physical Therapy

## 2024-09-03 DIAGNOSIS — R2681 Unsteadiness on feet: Secondary | ICD-10-CM

## 2024-09-03 DIAGNOSIS — R2689 Other abnormalities of gait and mobility: Secondary | ICD-10-CM

## 2024-09-03 DIAGNOSIS — R293 Abnormal posture: Secondary | ICD-10-CM

## 2024-09-03 DIAGNOSIS — M5459 Other low back pain: Secondary | ICD-10-CM

## 2024-09-03 DIAGNOSIS — M6281 Muscle weakness (generalized): Secondary | ICD-10-CM

## 2024-09-03 NOTE — Therapy (Unsigned)
 OUTPATIENT PHYSICAL THERAPY LOWER EXTREMITY TREATMENT    Patient Name: [name] MRN: [id number] DOB:[birth year], 83 y.o., female Today's Date: 09/04/2024  END OF SESSION:  PT End of Session - 09/03/24 1323     Visit Number 5    Number of Visits 16    Date for Recertification  10/29/24    PT Start Time 1315    PT Stop Time 1358    PT Time Calculation (min) 43 min    Activity Tolerance Patient tolerated treatment well    Behavior During Therapy [facility] for tasks assessed/performed          Progress Note Reporting Period 10/15 to 12/12  See note below for Objective Data and Assessment of Progress/Goals.       Past Medical History:  Diagnosis Date   Allergic rhinitis    Anemia    Anxiety    Bronchiectasis    Carpal tunnel syndrome 07/13/2015   Bilateral   CVA (cerebral infarction) 10/2007   Right thalamic    Diverticulosis of colon    Gait disorder    GERD (gastroesophageal reflux disease)    History of breast cancer    HTN (hypertension)    Hyperlipidemia    Idiopathic pulmonary fibrosis    Left knee DJD    Migraine    Osteoporosis    Peripheral neuropathy    PMR (polymyalgia rheumatica)    Polyneuropathy in other diseases classified elsewhere 02/17/2013   Previous back surgery 10/09/12   Renal insufficiency    Rheumatoid arthritis (HCC)    Seizure disorder (HCC)    Spondylosis, cervical, with myelopathy 08/31/2015   C3-4 myelopathy   Temporal arteritis (HCC)    Right eye blind, 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   Type II or unspecified type diabetes mellitus without mention of complication, not stated as uncontrolled    2nd to steriods   Vitamin D deficiency    Past Surgical History:  Procedure Laterality Date   APPENDECTOMY  01/2021   CARPAL TUNNEL RELEASE Right    CATARACT EXTRACTION     OS - Summer 2010   CERVICAL FUSION  09/24/2008   4 rods and pins in place   CHOLECYSTECTOMY     COLONOSCOPY  12/15/2011   Procedure: COLONOSCOPY;  Surgeon:  Renaye Sous, MD;  Location: [facility];  Service: Endoscopy;  Laterality: N/A;   LUMBAR FUSION  04/24/2010   W/Mechanical fixation - L2-5 [facility]   LUMBAR FUSION  09/25/2011   rods in hips (to stabilize).   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 08/25/2019   Pain management 10/30/2017   Rheumatoid arthritis (HCC) 04/25/2017   S/P cervical spinal fusion 09/05/2015   Spondylosis, cervical, with myelopathy 08/31/2015   Carpal tunnel syndrome 07/13/2015   Torn ear lobe 06/07/2015   Obesity (BMI  30-39.9) 04/06/2014   Shortness of breath 07/14/2013   Bone cyst 07/14/2013   Polyneuropathy in other diseases classified elsewhere 02/17/2013   Chronic back pain 12/09/2012   Peripheral edema 09/25/2012   Anemia due to chronic illness 09/01/2012   DDD (degenerative disc disease), lumbosacral 08/12/2012   Cervicalgia 07/11/2012   Disturbance of skin sensation 07/11/2012   Temporal arteritis (HCC) 04/01/2012   Lower GI bleed 12/24/2011   Rectal bleed 12/13/2011   Arthrodesis status 09/04/2011   Chronic renal insufficiency, stage II (mild) 04/04/2011   LUMBAR RADICULOPATHY, LEFT 04/18/2009   Essential hypertension 12/30/2007   SEIZURE DISORDER 12/30/2007   CEREBROVASCULAR ACCIDENT, HX OF 12/30/2007   Hyperlipidemia 10/08/2007   GERD 10/08/2007   DIVERTICULOSIS, COLON 10/08/2007   BRONCHIECTASIS  06/26/2007   PULMONARY FIBROSIS 05/17/2007   Osteoporosis 05/17/2007   BREAST CANCER, HX OF 05/17/2007   MASTECTOMY, LEFT, HX OF 05/17/2007    PCP: Dr Wolm Blunt   REFERRING PROVIDER: Dr Wolm Jobs   REFERRING DIAG:  Diagnosis  R29.6 (ICD-10-CM) - Frequent falls    THERAPY DIAG:  Other abnormalities of gait and mobility  Unsteadiness on feet  Abnormal posture  Muscle weakness (generalized)  Other low back pain  Rationale for Evaluation and Treatment: Rehabilitation  ONSET DATE:  Has had more falls over the past 6 months   SUBJECTIVE:   SUBJECTIVE STATEMENT: Patient reports has been very busy taking care of her sister lately.  She has been overall feeling somewhat more steady on her feet and stronger.  Eval: The patient has a long history of falls. The frequency has increased over the past few months. She has fallen 6x over the past 6 months . Her falls have come for a variety of reasons. She has hit her head with several of the falls. At times she feels like her legs give out. She uses a Rolator times but when she falls she is not using it at times.  She has an extensive past medical history of lumbar spine surgeries as well as PMR and RA which also likely contributed to falls.  PERTINENT HISTORY: RA, Left knee replacement, temporal arteritis, C-4-c4 fusion  Lumbar spine fusion t-10-L2, polyneuropathy PMR, migraine, history of breat cancer, gait disorder, frequent falls, anxiety, anemia, DMII,  PAIN:  Are you having pain?  NPRS scale: 5/10 when it happens, not constant  Pain location: generalized  Pain description: aches and pains  Aggravating factors: nothing  Relieving factors:  nothing   PRECAUTIONS: Fall  RED FLAGS: None   WEIGHT BEARING RESTRICTIONS: No  FALLS:  Has patient fallen in last 6 months? Yes. Number of falls 6  LIVING ENVIRONMENT: Has a ramp into the house,  OCCUPATION:  Retired  Hobbies:  Likes to walk   PLOF:  Independent  PATIENT GOALS:  To decrease the amount of falls she has  NEXT MD VISIT:    OBJECTIVE:  Note: Objective measures were completed at Evaluation unless otherwise noted.  DIAGNOSTIC FINDINGS:    PATIENT SURVEYS:  LEFS  Extreme difficulty/unable (0), Quite a bit of difficulty (1), Moderate difficulty (2), Little difficulty (3), No difficulty (4) Survey date:  eval 12/11   Any of your usual work, housework or school activities    2. Usual hobbies, recreational or sporting activities    3. Getting into/out of the bath    4. Walking between rooms    5. Putting on socks/shoes    6. Squatting     7. Lifting an object, like  a bag of groceries from the floor    8. Performing light activities around your home    9. Performing heavy activities around your home    10. Getting into/out of a car    11. Walking 2 blocks    12. Walking 1 mile    13. Going up/down 10 stairs (1 flight)    14. Standing for 1 hour    15.  sitting for 1 hour    16. Running on even ground    17. Running on uneven ground    18. Making sharp turns while running fast    19. Hopping     20. Rolling over in bed    Score total:  28/80 41/80     COGNITION: Overall cognitive status: Within functional limits for tasks assessed  Feels like her memory is getting a little worse    SENSATION: Numbness in bilateral feet      POSTURE: flexed trunk   PALPATION:  No significant tenderness to palpation in the back at this time    (Blank rows = not tested)  LOWER EXTREMITY MMT:  MMT Right eval Left eval    Hip flexion 16.9 16.3 22.0 25.0  Hip extension      Hip abduction 13.2 16.6 23.8 19.6  Hip adduction      Hip internal rotation      Hip external rotation      Knee flexion      Knee extension 21.9 17.8 26.2 23.5  Ankle dorsiflexion      Ankle plantarflexion      Ankle inversion      Ankle eversion       (Blank rows = not tested)   GAIT:  Gait: flexed trunk  Orthostatic B/P Seated  124/90 119/66  Eval: 5x sit to stand 21.3  12/11 5x sit to stand  14.8 sec     08/26/24- with rollator 417ft, severe L knee buckling with fatigue    08/12/24 0001  Standardized Balance Assessment  Standardized Balance Assessment Berg Balance Test  Berg Balance Test  Sit to Stand 3  Standing Unsupported 3  Sitting with Back Unsupported but Feet Supported on Floor or Stool 4  Stand to Sit 4  Transfers 4  Standing Unsupported with Eyes Closed 3  Standing Unsupported with Feet Together 3  From Standing, Reach Forward with Outstretched Arm 2  From Standing Position, Pick up Object from Floor 3  From Standing Position, Turn to Look Behind Over each Shoulder 2  Turn 360 Degrees 1  Standing Unsupported, Alternately Place Feet on Step/Stool 0  Standing Unsupported, One Foot in Front 0  Standing on One Leg 0  Total Score 32                                                                                                                                 TREATMENT DATE:  12/11  There:ex LAQ  3x10 red each leg  Seated march 3x10 red  Hip abduction 3x10 red  Nu-step 5 min   Neuro-re-ed:  Narrow base eyes open 2x30 se hold  Eyes closed 2x30 sec hold CGA   Tandem stance 2x30 sec hold bilateral min a  Standing march 2x10  Heel/toe 2x15 with UE for balance  08/26/24  with rollator Nustep L3 seat 6 x8 minutes for conditioning  Tandem stance 1x30 seconds B  Education on peripheral neuropathy- make sure she is checking any foot wounds regularly, potential for recovery from neuropathy or not depending on causative factors  Bridges x10 Supine clams red TB x10 Supine hip ABD red TB at ankles x10 B SLRs 0# x10 B STS with L LE staggered slightly back x10      08/18/24   There-act:  Sit to stand 5x3   Neuromuscular reeducation: Heel toe rock 3 x 10 Slow march for balance 3 x 10 Narrow base of support 2 x 20 eyes open 2 x 20 eyes closed Tandem stance 2 x 20  each leg  TherEX: Row machine life fitness 3 x 1010 pounds NuStep 5 minutes level 3  08/12/24  Berg   Nustep seat 5, L4x6 minutes BLEs only   Review of HEP- resisted hip flexion, resisted clams, resisted LAQs with red TB x10 each  STS with red TB above knees x10 from mat table  Education on HEP performance- 3 sets of 10 each exercise, 1x/day, discussed doing 3 sets of each before moving to next exercise  versus rotating through each exercise    PATIENT EDUCATION:  Education details: HEP, symptom management,  Person educated: Patient Education method: Explanation, Demonstration, Tactile cues, Verbal cues, and Handouts Education comprehension: verbalized understanding, returned demonstration, verbal cues required, tactile cues required, and needs further education  HOME EXERCISE PROGRAM: Access Code: [email or web address] URL: [email or web address] Date: 07/08/2024 Prepared by: Alm Don  ASSESSMENT:  CLINICAL IMPRESSION:  Therapy performed progress note of the patient today.  Overall she is making progress.  She is showing improvements in all her strength measurements.  She showed reduction in her 5 times sit to stand.  Overall she is feeling less syncopal.  Therapy did not have time to perform Berg balance testing today.  Her narrow base of support with eyes closed is improved.  She would benefit from further skilled therapy 2W8 to continue to work on higher-level balance exercises and to finalize balance program.  Goals  EVAL: Patient is a 83 year old female who presents to physical therapy following frequent falls.  She has bilateral lower extremity weakness.  Has baseline deficits in static balance.  Therapy will perform more extensive balance test next visit.  She has a history of lower back pain.  Without the device she requires contact-guard assist to minimum assist to transfer.  Her most recent fall was 2 weeks ago.  Her frequent falls are limiting her overall mobility.   She would benefit from skilled therapy to improve her safety. OBJECTIVE IMPAIRMENTS: Abnormal gait, decreased activity tolerance, decreased balance, difficulty walking, decreased ROM, decreased strength, impaired sensation, and pain.   ACTIVITY LIMITATIONS: carrying, lifting, bending, standing, squatting, stairs, transfers, and locomotion level  PARTICIPATION LIMITATIONS: meal prep, cleaning, laundry, shopping, and community activity  PERSONAL FACTORS: 3+ comorbidities: RA, multple neck and back fusions  left knee replacement, PMR  are also affecting patient's functional outcome.   REHAB POTENTIAL: Good  CLINICAL DECISION MAKING: Stable/uncomplicated  EVALUATION COMPLEXITY: Low   GOALS: Goals reviewed with patient? Yes  SHORT TERM GOALS: Target date: 08/06/2024    Patient will stand with narrow base with SBA Baseline: Goal status: MET 08/12/24 during Berg   2.  Patient will increase gross bilateral LE strength by 5 lbs  Baseline:  Goal status: met and updated to 5 lbs from 12/11  3.  Patient will decrease 5x sit to stand test by 5 sec  Baseline:  Goal status: achieved 12/11  4.  Patient will perfrom BERG balance testing Baseline:  Goal status: MET 08/12/24  LONG TERM GOALS: Target date: 09/02/2024    Patient will report no falls over a 1 month period prior to discharge  Baseline:  Goal status: has not had a fall recently 12/11  2.  Patient will ambulate limited community distances without pain or loss of balance  Baseline:  Goal status: INITIAL  3.  Patient will have complete and safe home program for balance and gait Baseline:  Goal status: INITIAL   PLAN:  PT FREQUENCY: 2x/week  PT DURATION: 8 weeks  PLANNED INTERVENTIONS: 97110-Therapeutic exercises, 97530- Therapeutic activity, [id number]- Neuromuscular re-education, 97535- Self Care, [id number]- Manual therapy, [phone]- Gait training, [phone]- Aquatic Therapy, Patient/Family education, Stair training, Taping, Dry  Needling, DME instructions, Cryotherapy, and Moist heat   PLAN FOR NEXT SESSION:   Continue balance training.  Progress exercise program as tolerated. Alm Don PT DPT 09/04/2024 1:24 PM

## 2024-09-04 ENCOUNTER — Encounter (HOSPITAL_BASED_OUTPATIENT_CLINIC_OR_DEPARTMENT_OTHER): Payer: Self-pay | Admitting: Physical Therapy

## 2024-09-08 ENCOUNTER — Ambulatory Visit (HOSPITAL_BASED_OUTPATIENT_CLINIC_OR_DEPARTMENT_OTHER): Admitting: Physical Therapy

## 2024-09-08 ENCOUNTER — Encounter (HOSPITAL_BASED_OUTPATIENT_CLINIC_OR_DEPARTMENT_OTHER): Payer: Self-pay | Admitting: Physical Therapy

## 2024-09-08 DIAGNOSIS — R2689 Other abnormalities of gait and mobility: Secondary | ICD-10-CM | POA: Diagnosis not present

## 2024-09-08 DIAGNOSIS — R2681 Unsteadiness on feet: Secondary | ICD-10-CM

## 2024-09-08 DIAGNOSIS — R293 Abnormal posture: Secondary | ICD-10-CM

## 2024-09-08 DIAGNOSIS — M6281 Muscle weakness (generalized): Secondary | ICD-10-CM

## 2024-09-08 NOTE — Therapy (Signed)
 OUTPATIENT PHYSICAL THERAPY LOWER EXTREMITY TREATMENT    Patient Name: [name] MRN: [id number] DOB:[birth year], 83 y.o., female Today's Date: 09/08/2024  END OF SESSION:  PT End of Session - 09/08/24 1351     Visit Number 6    Number of Visits 16    Date for Recertification  10/29/24    PT Start Time 1347    PT Stop Time 1428    PT Time Calculation (min) 41 min    Activity Tolerance Patient tolerated treatment well    Behavior During Therapy [facility] for tasks assessed/performed          Progress Note Reporting Period 10/15 to 12/12  See note below for Objective Data and Assessment of Progress/Goals.       Past Medical History:  Diagnosis Date   Allergic rhinitis    Anemia    Anxiety    Bronchiectasis    Carpal tunnel syndrome 07/13/2015   Bilateral   CVA (cerebral infarction) 10/2007   Right thalamic    Diverticulosis of colon    Gait disorder    GERD (gastroesophageal reflux disease)    History of breast cancer    HTN (hypertension)    Hyperlipidemia    Idiopathic pulmonary fibrosis    Left knee DJD    Migraine    Osteoporosis    Peripheral neuropathy    PMR (polymyalgia rheumatica)    Polyneuropathy in other diseases classified elsewhere 02/17/2013   Previous back surgery 10/09/12   Renal insufficiency    Rheumatoid arthritis (HCC)    Seizure disorder (HCC)    Spondylosis, cervical, with myelopathy 08/31/2015   C3-4 myelopathy   Temporal arteritis (HCC)    Right eye blind, 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   Type II or unspecified type diabetes mellitus without mention of complication, not stated as uncontrolled    2nd to steriods   Vitamin D deficiency    Past Surgical History:  Procedure Laterality Date   APPENDECTOMY  01/2021   CARPAL TUNNEL RELEASE Right    CATARACT EXTRACTION     OS - Summer 2010   CERVICAL FUSION  09/24/2008   4 rods and pins in place   CHOLECYSTECTOMY     COLONOSCOPY  12/15/2011   Procedure: COLONOSCOPY;  Surgeon:  Renaye Sous, MD;  Location: [facility];  Service: Endoscopy;  Laterality: N/A;   LUMBAR FUSION  04/24/2010   W/Mechanical fixation - L2-5 [facility]   LUMBAR FUSION  09/25/2011   rods in hips (to stabilize).   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 08/25/2019   Pain management 10/30/2017   Rheumatoid arthritis (HCC) 04/25/2017   S/P cervical spinal fusion 09/05/2015   Spondylosis, cervical, with myelopathy 08/31/2015   Carpal tunnel syndrome 07/13/2015   Torn ear lobe 06/07/2015   Obesity (BMI  30-39.9) 04/06/2014   Shortness of breath 07/14/2013   Bone cyst 07/14/2013   Polyneuropathy in other diseases classified elsewhere 02/17/2013   Chronic back pain 12/09/2012   Peripheral edema 09/25/2012   Anemia due to chronic illness 09/01/2012   DDD (degenerative disc disease), lumbosacral 08/12/2012   Cervicalgia 07/11/2012   Disturbance of skin sensation 07/11/2012   Temporal arteritis (HCC) 04/01/2012   Lower GI bleed 12/24/2011   Rectal bleed 12/13/2011   Arthrodesis status 09/04/2011   Chronic renal insufficiency, stage II (mild) 04/04/2011   LUMBAR RADICULOPATHY, LEFT 04/18/2009   Essential hypertension 12/30/2007   SEIZURE DISORDER 12/30/2007   CEREBROVASCULAR ACCIDENT, HX OF 12/30/2007   Hyperlipidemia 10/08/2007   GERD 10/08/2007   DIVERTICULOSIS, COLON 10/08/2007   BRONCHIECTASIS  06/26/2007   PULMONARY FIBROSIS 05/17/2007   Osteoporosis 05/17/2007   BREAST CANCER, HX OF 05/17/2007   MASTECTOMY, LEFT, HX OF 05/17/2007    PCP: Dr Wolm Blunt   REFERRING PROVIDER: Dr Wolm Jobs   REFERRING DIAG:  Diagnosis  R29.6 (ICD-10-CM) - Frequent falls    THERAPY DIAG:  Other abnormalities of gait and mobility  Unsteadiness on feet  Abnormal posture  Muscle weakness (generalized)  Rationale for Evaluation and Treatment: Rehabilitation  ONSET DATE:  Has had more falls over the past 6 months   SUBJECTIVE:   SUBJECTIVE STATEMENT: The patient reports she is feeling weak today. She did a lot over the weekend with her sister.   Eval: The patient has a long history of falls. The frequency has increased over the past few months. She has fallen 6x over the past 6 months . Her falls have come for a variety of reasons. She has hit her head with several of the falls. At times she feels like her legs give out. She uses a Rolator times but when she falls she is not using it at times.  She has an extensive past medical history of lumbar spine surgeries as well as PMR and RA which also likely contributed to falls.  PERTINENT HISTORY: RA, Left knee replacement, temporal arteritis, C-4-c4 fusion  Lumbar spine fusion t-10-L2, polyneuropathy PMR, migraine, history of breat cancer, gait disorder, frequent falls, anxiety, anemia, DMII,  PAIN:  Are you having pain?  NPRS scale: 5/10 when it happens, not constant  Pain location: generalized  Pain description: aches and pains  Aggravating factors: nothing  Relieving factors:  nothing   PRECAUTIONS: Fall  RED FLAGS: None   WEIGHT BEARING RESTRICTIONS: No  FALLS:  Has patient fallen in last 6 months? Yes. Number of falls 6  LIVING ENVIRONMENT: Has a ramp into the house,  OCCUPATION:  Retired  Hobbies:  Likes to walk   PLOF: Independent  PATIENT GOALS:  To decrease the amount of falls she has  NEXT MD  VISIT:    OBJECTIVE:  Note: Objective measures were completed at Evaluation unless otherwise noted.  DIAGNOSTIC FINDINGS:    PATIENT SURVEYS:  LEFS  Extreme difficulty/unable (0), Quite a bit of difficulty (1), Moderate difficulty (2), Little difficulty (3), No difficulty (4) Survey date:  eval 12/11   Any of your usual work, housework or school activities    2. Usual hobbies, recreational or sporting activities    3. Getting into/out of the bath    4. Walking between rooms    5. Putting on socks/shoes    6. Squatting     7. Lifting an object, like a bag of groceries from the floor    8. Performing  light activities around your home    9. Performing heavy activities around your home    10. Getting into/out of a car    11. Walking 2 blocks    12. Walking 1 mile    13. Going up/down 10 stairs (1 flight)    14. Standing for 1 hour    15.  sitting for 1 hour    16. Running on even ground    17. Running on uneven ground    18. Making sharp turns while running fast    19. Hopping     20. Rolling over in bed    Score total:  28/80 41/80     COGNITION: Overall cognitive status: Within functional limits for tasks assessed  Feels like her memory is getting a little worse    SENSATION: Numbness in bilateral feet      POSTURE: flexed trunk   PALPATION:  No significant tenderness to palpation in the back at this time    (Blank rows = not tested)  LOWER EXTREMITY MMT:  MMT Right eval Left eval    Hip flexion 16.9 16.3 22.0 25.0  Hip extension      Hip abduction 13.2 16.6 23.8 19.6  Hip adduction      Hip internal rotation      Hip external rotation      Knee flexion      Knee extension 21.9 17.8 26.2 23.5  Ankle dorsiflexion      Ankle plantarflexion      Ankle inversion      Ankle eversion       (Blank rows = not tested)   GAIT:  Gait: flexed trunk  Orthostatic B/P Seated 124/90 119/66  Eval: 5x sit to stand 21.3  12/11 5x sit to stand  14.8 sec      08/26/24- with rollator 465ft, severe L knee buckling with fatigue    08/12/24 0001  Standardized Balance Assessment  Standardized Balance Assessment Berg Balance Test  Berg Balance Test  Sit to Stand 3  Standing Unsupported 3  Sitting with Back Unsupported but Feet Supported on Floor or Stool 4  Stand to Sit 4  Transfers 4  Standing Unsupported with Eyes Closed 3  Standing Unsupported with Feet Together 3  From Standing, Reach Forward with Outstretched Arm 2  From Standing Position, Pick up Object from Floor 3  From Standing Position, Turn to Look Behind Over each Shoulder 2  Turn 360 Degrees 1  Standing Unsupported, Alternately Place Feet on Step/Stool 0  Standing Unsupported, One Foot in Front 0  Standing on One Leg 0  Total Score 32                                                                                                                                 TREATMENT DATE:  12/17 There-ex:  Nu step   Tandem stance 2x30 sec hold bilateral min a  Standing march 2x10  Heel/toe 2x15 with UE for balance Slow march for balance  Step over hurdle 2x10   All with CGA and gait belt   Seated wand flexion 3x10  Seated biceps curls   both with cuing for posture    12/11  There:ex LAQ 3x10 red each leg  Seated march 3x10 red  Hip abduction 3x10 red  Nu-step 5 min   Neuro-re-ed:  Narrow base eyes open 2x30 se hold  Eyes closed 2x30 sec hold CGA   Tandem stance 2x30 sec hold bilateral min a  Standing march 2x10  Heel/toe 2x15 with UE for balance  08/26/24  with rollator Nustep L3 seat 6 x8 minutes for conditioning  Tandem stance 1x30 seconds B  Education on peripheral neuropathy- make sure she is checking any foot wounds regularly, potential for recovery from neuropathy or not depending on causative factors  Bridges x10 Supine clams red TB x10 Supine hip ABD red TB at ankles x10 B SLRs 0# x10 B STS with L LE staggered slightly back x10       08/18/24   There-act:  Sit to stand 5x3   Neuromuscular reeducation: Heel toe rock 3 x 10 Slow march for balance 3 x 10 Narrow base of support 2 x 20 eyes open 2 x 20 eyes closed Tandem stance 2 x 20 each leg  TherEX: Row machine life fitness 3 x 1010 pounds NuStep 5 minutes level 3  08/12/24  Berg   Nustep seat 5, L4x6 minutes BLEs only   Review of HEP- resisted hip flexion, resisted clams, resisted LAQs with red TB x10 each  STS with red TB above knees x10 from mat table  Education on HEP performance- 3 sets of 10 each exercise, 1x/day, discussed doing 3 sets of each before moving to next exercise  versus rotating through each exercise    PATIENT EDUCATION:  Education details: HEP, symptom management,  Person educated: Patient Education method: Explanation, Demonstration, Tactile cues, Verbal cues, and Handouts Education comprehension: verbalized understanding, returned demonstration, verbal cues required, tactile cues required, and needs further education  HOME EXERCISE PROGRAM: Access Code: [email or web address] URL: [email or web address] Date: 07/08/2024 Prepared by: Alm Don  ASSESSMENT:  CLINICAL IMPRESSION: Despite baseline fatigue, the patient felt good with exercises and balance wok. She required guarding for balance. She improved with ehr step over as she practiced more. We will continue to work on exercises that get her to lift her legs more.   EVAL: Patient is a 83 year old female who presents to physical therapy following frequent falls.  She has bilateral lower extremity weakness.  Has baseline deficits in static balance.  Therapy will perform more extensive balance test next visit.  She has a history of lower back pain.  Without the device she requires contact-guard assist to minimum assist to transfer.  Her most recent fall was 2 weeks ago.  Her frequent falls are limiting her overall mobility.  She would benefit from skilled therapy to  improve her safety. OBJECTIVE IMPAIRMENTS: Abnormal gait, decreased activity tolerance, decreased balance, difficulty walking, decreased ROM, decreased strength, impaired sensation, and pain.   ACTIVITY LIMITATIONS: carrying, lifting, bending, standing, squatting, stairs, transfers, and locomotion level  PARTICIPATION LIMITATIONS: meal prep, cleaning, laundry, shopping, and community activity  PERSONAL FACTORS: 3+ comorbidities: RA, multple neck and back fusions  left knee replacement, PMR  are also affecting patient's functional outcome.   REHAB POTENTIAL: Good  CLINICAL DECISION MAKING: Stable/uncomplicated  EVALUATION COMPLEXITY: Low   GOALS: Goals  reviewed with patient? Yes  SHORT TERM GOALS: Target date: 08/06/2024    Patient will stand with narrow base with SBA Baseline: Goal status: MET 08/12/24 during Berg   2.  Patient will increase gross bilateral LE strength by 5 lbs  Baseline:  Goal status: met and updated to 5 lbs from 12/11  3.  Patient will decrease 5x sit to stand test by 5 sec  Baseline:  Goal status: achieved 12/11  4.  Patient will perfrom BERG balance testing Baseline:  Goal status: MET 08/12/24  LONG TERM GOALS: Target date: 09/02/2024    Patient will report no falls over a 1 month period prior to discharge  Baseline:  Goal status: has not had a fall recently 12/11  2.  Patient will ambulate limited community distances without pain or loss of balance  Baseline:  Goal status: INITIAL  3.  Patient will have complete and safe home program for balance and gait Baseline:  Goal status: INITIAL   PLAN:  PT FREQUENCY: 2x/week  PT DURATION: 8 weeks  PLANNED INTERVENTIONS: 97110-Therapeutic exercises, 97530- Therapeutic activity, [id number]- Neuromuscular re-education, 97535- Self Care, [id number]- Manual therapy, [phone]- Gait training, [phone]- Aquatic Therapy, Patient/Family education, Stair training, Taping, Dry Needling, DME instructions, Cryotherapy,  and Moist heat   PLAN FOR NEXT SESSION:   Continue balance training.  Progress exercise program as tolerated. Alm Don PT DPT 09/08/2024 2:19 PM

## 2024-09-09 ENCOUNTER — Encounter (HOSPITAL_BASED_OUTPATIENT_CLINIC_OR_DEPARTMENT_OTHER): Payer: Self-pay | Admitting: Physical Therapy

## 2024-09-09 ENCOUNTER — Ambulatory Visit: Admitting: Family Medicine

## 2024-09-09 ENCOUNTER — Encounter: Payer: Self-pay | Admitting: Family Medicine

## 2024-09-09 VITALS — BP 134/66 | HR 83 | Temp 98.1°F | Wt 122.7 lb

## 2024-09-09 DIAGNOSIS — J3489 Other specified disorders of nose and nasal sinuses: Secondary | ICD-10-CM

## 2024-09-09 DIAGNOSIS — S01312A Laceration without foreign body of left ear, initial encounter: Secondary | ICD-10-CM

## 2024-09-09 NOTE — Progress Notes (Signed)
 Established Patient Office Visit  Subjective   Patient ID: [name], female    DOB: 1941/09/11  Age: 83 y.o. MRN: [id number]  Chief Complaint  Patient presents with   Ear Problem    HPI   [name] is seen today for the following issues  She has a split left earlobe.  She saw a engineer, petroleum about 4 years ago and had apparently similar issues then.  She recently noticed the lobe split and she would like to get this repaired.  She does hope to use earrings again.  She is also self-conscious cosmetically in the appearance.  No pain.  Frequent postnasal drip symptoms.  Clear mucus.  Occasional hoarseness.  No fevers or chills.  Past Medical History:  Diagnosis Date   Allergic rhinitis    Anemia    Anxiety    Bronchiectasis    Carpal tunnel syndrome 07/13/2015   Bilateral   CVA (cerebral infarction) 10/2007   Right thalamic    Diverticulosis of colon    Gait disorder    GERD (gastroesophageal reflux disease)    History of breast cancer    HTN (hypertension)    Hyperlipidemia    Idiopathic pulmonary fibrosis    Left knee DJD    Migraine    Osteoporosis    Peripheral neuropathy    PMR (polymyalgia rheumatica)    Polyneuropathy in other diseases classified elsewhere 02/17/2013   Previous back surgery 10/09/12   Renal insufficiency    Rheumatoid arthritis (HCC)    Seizure disorder (HCC)    Spondylosis, cervical, with myelopathy 08/31/2015   C3-4 myelopathy   Temporal arteritis (HCC)    Right eye blind, 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   Type II or unspecified type diabetes mellitus without mention of complication, not stated as uncontrolled    2nd to steriods   Vitamin D deficiency    Past Surgical History:  Procedure Laterality Date   APPENDECTOMY  01/2021   CARPAL TUNNEL RELEASE Right    CATARACT EXTRACTION     OS - Summer 2010   CERVICAL FUSION  09/24/2008   4 rods and pins in place   CHOLECYSTECTOMY     COLONOSCOPY  12/15/2011   Procedure:  COLONOSCOPY;  Surgeon: Renaye Sous, MD;  Location: [facility];  Service: Endoscopy;  Laterality: N/A;   LUMBAR FUSION  04/24/2010   W/Mechanical fixation - L2-5 [facility]   LUMBAR FUSION  09/25/2011   rods in hips (to stabilize).   MASTECTOMY     NECK SURGERY     rheumatoid nodule removal     SPINAL FUSION  07/25/2010   T10-L2 interbody fusion / [facility]   TONSILLECTOMY     TOTAL KNEE ARTHROPLASTY     Left    reports that she has never smoked. She has never used smokeless tobacco. She reports that she does not currently use alcohol. She reports that she does not use drugs. family history includes Arthritis in her father and mother; Brain cancer in her mother; Breast cancer in her sister; Dementia in her father; Hypertension in her father and mother; Lung cancer in her sister. Allergies[1]    Review of Systems  Constitutional:  Negative for chills and fever.  HENT:  Positive for congestion.   Cardiovascular:  Negative for chest pain.      Objective:     BP 134/66   Pulse 83   Temp 98.1 F (36.7 C) (Oral)   Wt 122 lb 11.2 oz (  55.7 kg)   SpO2 94%   BMI 23.18 kg/m  BP Readings from Last 3 Encounters:  09/09/24 134/66  08/14/24 (!) 150/70  08/07/24 100/60   Wt Readings from Last 3 Encounters:  09/09/24 122 lb 11.2 oz (55.7 kg)  08/14/24 123 lb 12.8 oz (56.2 kg)  08/07/24 119 lb (54 kg)      Physical Exam Vitals reviewed.  Constitutional:      General: She is not in acute distress.    Appearance: She is not ill-appearing.  HENT:     Ears:     Comments: She has a split left earlobe.  No erythema.  No skin lesions. Neurological:     Mental Status: She is alert.      No results found for any visits on 09/09/24.    The ASCVD Risk score (Arnett DK, et al., 2019) failed to calculate for the following reasons:   The 2019 ASCVD risk score is only valid for ages 33 to 58   * - Cholesterol units were assumed    Assessment & Plan:   #1 split  left earlobe.  Patient would like to get this repaired if possible.  Recommend referral back to plastic surgeon whom she had seen previously for this several years ago.  Referral placed  #2 frequent postnasal drip symptoms.  Suspect allergic rhinitis.  Recommend trial of Flonase  nasal 2 sprays per nostril once daily   No follow-ups on file.    Wolm Scarlet, MD     [1]  Allergies Allergen Reactions   Atorvastatin  Nausea Only and Other (See Comments)    Dizzyness.    Hydromorphone Other (See Comments)    Cognitive changes  made me unconscious Dilaudid

## 2024-09-09 NOTE — Patient Instructions (Signed)
 Consider Flonase /Fluticasone  two sprays per nostril once daily as needed for nasal drainage

## 2024-09-10 ENCOUNTER — Ambulatory Visit (HOSPITAL_BASED_OUTPATIENT_CLINIC_OR_DEPARTMENT_OTHER): Admitting: Physical Therapy

## 2024-09-10 ENCOUNTER — Other Ambulatory Visit: Payer: Self-pay | Admitting: Neurology

## 2024-09-10 ENCOUNTER — Encounter (HOSPITAL_BASED_OUTPATIENT_CLINIC_OR_DEPARTMENT_OTHER): Payer: Self-pay | Admitting: Physical Therapy

## 2024-09-10 DIAGNOSIS — R2681 Unsteadiness on feet: Secondary | ICD-10-CM

## 2024-09-10 DIAGNOSIS — M6281 Muscle weakness (generalized): Secondary | ICD-10-CM

## 2024-09-10 DIAGNOSIS — R2689 Other abnormalities of gait and mobility: Secondary | ICD-10-CM

## 2024-09-10 DIAGNOSIS — R293 Abnormal posture: Secondary | ICD-10-CM

## 2024-09-10 NOTE — Therapy (Signed)
 OUTPATIENT PHYSICAL THERAPY LOWER EXTREMITY TREATMENT    Patient Name: [name] MRN: [id number] DOB:04-22-1941, 83 y.o., female Today's Date: 09/10/2024  END OF SESSION:  PT End of Session - 09/10/24 1234     Visit Number 7    Number of Visits 16    Date for Recertification  10/29/24    PT Start Time 1231    PT Stop Time 1313    PT Time Calculation (min) 42 min    Activity Tolerance Patient tolerated treatment well    Behavior During Therapy [facility] for tasks assessed/performed          Progress Note Reporting Period 10/15 to 12/12  See note below for Objective Data and Assessment of Progress/Goals.       Past Medical History:  Diagnosis Date   Allergic rhinitis    Anemia    Anxiety    Bronchiectasis    Carpal tunnel syndrome 07/13/2015   Bilateral   CVA (cerebral infarction) 10/2007   Right thalamic    Diverticulosis of colon    Gait disorder    GERD (gastroesophageal reflux disease)    History of breast cancer    HTN (hypertension)    Hyperlipidemia    Idiopathic pulmonary fibrosis    Left knee DJD    Migraine    Osteoporosis    Peripheral neuropathy    PMR (polymyalgia rheumatica)    Polyneuropathy in other diseases classified elsewhere 02/17/2013   Previous back surgery 10/09/12   Renal insufficiency    Rheumatoid arthritis (HCC)    Seizure disorder (HCC)    Spondylosis, cervical, with myelopathy 08/31/2015   C3-4 myelopathy   Temporal arteritis (HCC)    Right eye blind, 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   Type II or unspecified type diabetes mellitus without mention of complication, not stated as uncontrolled    2nd to steriods   Vitamin D deficiency    Past Surgical History:  Procedure Laterality Date   APPENDECTOMY  01/2021   CARPAL TUNNEL RELEASE Right    CATARACT EXTRACTION     OS - Summer 2010   CERVICAL FUSION  09/24/2008   4 rods and pins in place   CHOLECYSTECTOMY     COLONOSCOPY  12/15/2011   Procedure: COLONOSCOPY;  Surgeon:  Renaye Sous, MD;  Location: [facility];  Service: Endoscopy;  Laterality: N/A;   LUMBAR FUSION  04/24/2010   W/Mechanical fixation - L2-5 [facility]   LUMBAR FUSION  09/25/2011   rods in hips (to stabilize).   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 08/25/2019   Pain management 10/30/2017   Rheumatoid arthritis (HCC) 04/25/2017   S/P cervical spinal fusion 09/05/2015   Spondylosis, cervical, with myelopathy 08/31/2015   Carpal tunnel syndrome 07/13/2015   Torn ear lobe 06/07/2015   Obesity (BMI  30-39.9) 04/06/2014   Shortness of breath 07/14/2013   Bone cyst 07/14/2013   Polyneuropathy in other diseases classified elsewhere 02/17/2013   Chronic back pain 12/09/2012   Peripheral edema 09/25/2012   Anemia due to chronic illness 09/01/2012   DDD (degenerative disc disease), lumbosacral 08/12/2012   Cervicalgia 07/11/2012   Disturbance of skin sensation 07/11/2012   Temporal arteritis (HCC) 04/01/2012   Lower GI bleed 12/24/2011   Rectal bleed 12/13/2011   Arthrodesis status 09/04/2011   Chronic renal insufficiency, stage II (mild) 04/04/2011   LUMBAR RADICULOPATHY, LEFT 04/18/2009   Essential hypertension 12/30/2007   SEIZURE DISORDER 12/30/2007   CEREBROVASCULAR ACCIDENT, HX OF 12/30/2007   Hyperlipidemia 10/08/2007   GERD 10/08/2007   DIVERTICULOSIS, COLON 10/08/2007   BRONCHIECTASIS  06/26/2007   PULMONARY FIBROSIS 05/17/2007   Osteoporosis 05/17/2007   BREAST CANCER, HX OF 05/17/2007   MASTECTOMY, LEFT, HX OF 05/17/2007    PCP: Dr Wolm Blunt   REFERRING PROVIDER: Dr Wolm Jobs   REFERRING DIAG:  Diagnosis  R29.6 (ICD-10-CM) - Frequent falls    THERAPY DIAG:  Other abnormalities of gait and mobility  Unsteadiness on feet  Abnormal posture  Muscle weakness (generalized)  Rationale for Evaluation and Treatment: Rehabilitation  ONSET DATE:  Has had more falls over the past 6 months   SUBJECTIVE:   SUBJECTIVE STATEMENT: The patient reports she is feeling weak today. She did a lot over the weekend with her sister.   Eval: The patient has a long history of falls. The frequency has increased over the past few months. She has fallen 6x over the past 6 months . Her falls have come for a variety of reasons. She has hit her head with several of the falls. At times she feels like her legs give out. She uses a Rolator times but when she falls she is not using it at times.  She has an extensive past medical history of lumbar spine surgeries as well as PMR and RA which also likely contributed to falls.  PERTINENT HISTORY: RA, Left knee replacement, temporal arteritis, C-4-c4 fusion  Lumbar spine fusion t-10-L2, polyneuropathy PMR, migraine, history of breat cancer, gait disorder, frequent falls, anxiety, anemia, DMII,  PAIN:  Are you having pain?  NPRS scale: 5/10 when it happens, not constant  Pain location: generalized  Pain description: aches and pains  Aggravating factors: nothing  Relieving factors:  nothing   PRECAUTIONS: Fall  RED FLAGS: None   WEIGHT BEARING RESTRICTIONS: No  FALLS:  Has patient fallen in last 6 months? Yes. Number of falls 6  LIVING ENVIRONMENT: Has a ramp into the house,  OCCUPATION:  Retired  Hobbies:  Likes to walk   PLOF: Independent  PATIENT GOALS:  To decrease the amount of falls she has  NEXT MD  VISIT:    OBJECTIVE:  Note: Objective measures were completed at Evaluation unless otherwise noted.  DIAGNOSTIC FINDINGS:    PATIENT SURVEYS:  LEFS  Extreme difficulty/unable (0), Quite a bit of difficulty (1), Moderate difficulty (2), Little difficulty (3), No difficulty (4) Survey date:  eval 12/11   Any of your usual work, housework or school activities    2. Usual hobbies, recreational or sporting activities    3. Getting into/out of the bath    4. Walking between rooms    5. Putting on socks/shoes    6. Squatting     7. Lifting an object, like a bag of groceries from the floor    8. Performing  light activities around your home    9. Performing heavy activities around your home    10. Getting into/out of a car    11. Walking 2 blocks    12. Walking 1 mile    13. Going up/down 10 stairs (1 flight)    14. Standing for 1 hour    15.  sitting for 1 hour    16. Running on even ground    17. Running on uneven ground    18. Making sharp turns while running fast    19. Hopping     20. Rolling over in bed    Score total:  28/80 41/80     COGNITION: Overall cognitive status: Within functional limits for tasks assessed  Feels like her memory is getting a little worse    SENSATION: Numbness in bilateral feet      POSTURE: flexed trunk   PALPATION:  No significant tenderness to palpation in the back at this time    (Blank rows = not tested)  LOWER EXTREMITY MMT:  MMT Right eval Left eval    Hip flexion 16.9 16.3 22.0 25.0  Hip extension      Hip abduction 13.2 16.6 23.8 19.6  Hip adduction      Hip internal rotation      Hip external rotation      Knee flexion      Knee extension 21.9 17.8 26.2 23.5  Ankle dorsiflexion      Ankle plantarflexion      Ankle inversion      Ankle eversion       (Blank rows = not tested)   GAIT:  Gait: flexed trunk  Orthostatic B/P Seated 124/90 119/66  Eval: 5x sit to stand 21.3  12/11 5x sit to stand  14.8 sec      08/26/24- with rollator 457ft, severe L knee buckling with fatigue    08/12/24 0001  Standardized Balance Assessment  Standardized Balance Assessment Berg Balance Test  Berg Balance Test  Sit to Stand 3  Standing Unsupported 3  Sitting with Back Unsupported but Feet Supported on Floor or Stool 4  Stand to Sit 4  Transfers 4  Standing Unsupported with Eyes Closed 3  Standing Unsupported with Feet Together 3  From Standing, Reach Forward with Outstretched Arm 2  From Standing Position, Pick up Object from Floor 3  From Standing Position, Turn to Look Behind Over each Shoulder 2  Turn 360 Degrees 1  Standing Unsupported, Alternately Place Feet on Step/Stool 0  Standing Unsupported, One Foot in Front 0  Standing on One Leg 0  Total Score 32                                                                                                                                 TREATMENT DATE:  12/17 There-ex:  Nu step   Tandem stance 2x30 sec hold bilateral min a  Standing march 2x10  Heel/toe 2x15 with UE for balance Slow march for balance  Step over hurdle 2x10   All with CGA and gait belt   Seated wand flexion 3x10  Seated biceps curls   both with cuing for posture    12/11  There:ex LAQ 3x10 red each leg  Seated march 3x10 red  Hip abduction 3x10 red  Nu-step 5 min   Neuro-re-ed:  Narrow base eyes open 2x30 se hold  Eyes closed 2x30 sec hold CGA   Tandem stance 2x30 sec hold bilateral min a  Standing march 2x10  Heel/toe 2x15 with UE for balance  08/26/24  with rollator Nustep L3 seat 6 x8 minutes for conditioning  Tandem stance 1x30 seconds B  Education on peripheral neuropathy- make sure she is checking any foot wounds regularly, potential for recovery from neuropathy or not depending on causative factors  Bridges x10 Supine clams red TB x10 Supine hip ABD red TB at ankles x10 B SLRs 0# x10 B STS with L LE staggered slightly back x10       08/18/24   There-act:  Sit to stand 5x3   Neuromuscular reeducation: Heel toe rock 3 x 10 Slow march for balance 3 x 10 Narrow base of support 2 x 20 eyes open 2 x 20 eyes closed Tandem stance 2 x 20 each leg  TherEX: Row machine life fitness 3 x 1010 pounds NuStep 5 minutes level 3  08/12/24  Berg   Nustep seat 5, L4x6 minutes BLEs only   Review of HEP- resisted hip flexion, resisted clams, resisted LAQs with red TB x10 each  STS with red TB above knees x10 from mat table  Education on HEP performance- 3 sets of 10 each exercise, 1x/day, discussed doing 3 sets of each before moving to next exercise  versus rotating through each exercise    PATIENT EDUCATION:  Education details: HEP, symptom management,  Person educated: Patient Education method: Explanation, Demonstration, Tactile cues, Verbal cues, and Handouts Education comprehension: verbalized understanding, returned demonstration, verbal cues required, tactile cues required, and needs further education  HOME EXERCISE PROGRAM: Access Code: [email or web address] URL: [email or web address] Date: 07/08/2024 Prepared by: Alm Don  ASSESSMENT:  CLINICAL IMPRESSION: Despite baseline fatigue, the patient felt good with exercises and balance wok. She required guarding for balance. She improved with ehr step over as she practiced more. We will continue to work on exercises that get her to lift her legs more.   EVAL: Patient is a 83 year old female who presents to physical therapy following frequent falls.  She has bilateral lower extremity weakness.  Has baseline deficits in static balance.  Therapy will perform more extensive balance test next visit.  She has a history of lower back pain.  Without the device she requires contact-guard assist to minimum assist to transfer.  Her most recent fall was 2 weeks ago.  Her frequent falls are limiting her overall mobility.  She would benefit from skilled therapy to  improve her safety. OBJECTIVE IMPAIRMENTS: Abnormal gait, decreased activity tolerance, decreased balance, difficulty walking, decreased ROM, decreased strength, impaired sensation, and pain.   ACTIVITY LIMITATIONS: carrying, lifting, bending, standing, squatting, stairs, transfers, and locomotion level  PARTICIPATION LIMITATIONS: meal prep, cleaning, laundry, shopping, and community activity  PERSONAL FACTORS: 3+ comorbidities: RA, multple neck and back fusions  left knee replacement, PMR  are also affecting patient's functional outcome.   REHAB POTENTIAL: Good  CLINICAL DECISION MAKING: Stable/uncomplicated  EVALUATION COMPLEXITY: Low   GOALS: Goals  reviewed with patient? Yes  SHORT TERM GOALS: Target date: 08/06/2024    Patient will stand with narrow base with SBA Baseline: Goal status: MET 08/12/24 during Berg   2.  Patient will increase gross bilateral LE strength by 5 lbs  Baseline:  Goal status: met and updated to 5 lbs from 12/11  3.  Patient will decrease 5x sit to stand test by 5 sec  Baseline:  Goal status: achieved 12/11  4.  Patient will perfrom BERG balance testing Baseline:  Goal status: MET 08/12/24  LONG TERM GOALS: Target date: 09/02/2024    Patient will report no falls over a 1 month period prior to discharge  Baseline:  Goal status: has not had a fall recently 12/11  2.  Patient will ambulate limited community distances without pain or loss of balance  Baseline:  Goal status: INITIAL  3.  Patient will have complete and safe home program for balance and gait Baseline:  Goal status: INITIAL   PLAN:  PT FREQUENCY: 2x/week  PT DURATION: 8 weeks  PLANNED INTERVENTIONS: 97110-Therapeutic exercises, 97530- Therapeutic activity, [id number]- Neuromuscular re-education, 97535- Self Care, [id number]- Manual therapy, [phone]- Gait training, [id number]- Aquatic Therapy, Patient/Family education, Stair training, Taping, Dry Needling, DME instructions, Cryotherapy,  and Moist heat   PLAN FOR NEXT SESSION:   Continue balance training.  Progress exercise program as tolerated. Alm Don PT DPT 09/10/2024 12:56 PM

## 2024-09-11 ENCOUNTER — Encounter (HOSPITAL_BASED_OUTPATIENT_CLINIC_OR_DEPARTMENT_OTHER): Payer: Self-pay | Admitting: Physical Therapy

## 2024-09-16 ENCOUNTER — Other Ambulatory Visit: Payer: Self-pay | Admitting: Family Medicine

## 2024-09-22 ENCOUNTER — Ambulatory Visit (HOSPITAL_BASED_OUTPATIENT_CLINIC_OR_DEPARTMENT_OTHER): Admitting: Physical Therapy

## 2024-09-22 ENCOUNTER — Ambulatory Visit: Payer: Self-pay

## 2024-09-22 ENCOUNTER — Emergency Department (HOSPITAL_COMMUNITY)

## 2024-09-22 ENCOUNTER — Emergency Department (HOSPITAL_COMMUNITY): Admission: EM | Admit: 2024-09-22 | Discharge: 2024-09-23 | Discharge status: Against Medical Advice

## 2024-09-22 DIAGNOSIS — W01198A Fall on same level from slipping, tripping and stumbling with subsequent striking against other object, initial encounter: Secondary | ICD-10-CM | POA: Insufficient documentation

## 2024-09-22 DIAGNOSIS — R531 Weakness: Secondary | ICD-10-CM | POA: Insufficient documentation

## 2024-09-22 DIAGNOSIS — M545 Low back pain, unspecified: Secondary | ICD-10-CM | POA: Insufficient documentation

## 2024-09-22 DIAGNOSIS — M25562 Pain in left knee: Secondary | ICD-10-CM | POA: Diagnosis present

## 2024-09-22 DIAGNOSIS — Z5321 Procedure and treatment not carried out due to patient leaving prior to being seen by health care provider: Secondary | ICD-10-CM | POA: Diagnosis not present

## 2024-09-22 DIAGNOSIS — M25561 Pain in right knee: Secondary | ICD-10-CM | POA: Insufficient documentation

## 2024-09-22 LAB — COMPREHENSIVE METABOLIC PANEL WITH GFR
ALT: 18 U/L (ref 0–44)
AST: 29 U/L (ref 15–41)
Albumin: 4 g/dL (ref 3.5–5.0)
Alkaline Phosphatase: 79 U/L (ref 38–126)
Anion gap: 9 (ref 5–15)
BUN: 26 mg/dL — ABNORMAL HIGH (ref 8–23)
CO2: 28 mmol/L (ref 22–32)
Calcium: 9.1 mg/dL (ref 8.9–10.3)
Chloride: 103 mmol/L (ref 98–111)
Creatinine, Ser: 1.47 mg/dL — ABNORMAL HIGH (ref 0.44–1.00)
GFR, Estimated: 35 mL/min — ABNORMAL LOW
Glucose, Bld: 94 mg/dL (ref 70–99)
Potassium: 4.5 mmol/L (ref 3.5–5.1)
Sodium: 141 mmol/L (ref 135–145)
Total Bilirubin: 0.8 mg/dL (ref 0.0–1.2)
Total Protein: 7.4 g/dL (ref 6.5–8.1)

## 2024-09-22 LAB — CBC WITH DIFFERENTIAL/PLATELET
Abs Immature Granulocytes: 0.02 K/uL (ref 0.00–0.07)
Basophils Absolute: 0.1 K/uL (ref 0.0–0.1)
Basophils Relative: 1 %
Eosinophils Absolute: 0.4 K/uL (ref 0.0–0.5)
Eosinophils Relative: 4 %
HCT: 43.3 % (ref 36.0–46.0)
Hemoglobin: 13.6 g/dL (ref 12.0–15.0)
Immature Granulocytes: 0 %
Lymphocytes Relative: 22 %
Lymphs Abs: 2 K/uL (ref 0.7–4.0)
MCH: 31.5 pg (ref 26.0–34.0)
MCHC: 31.4 g/dL (ref 30.0–36.0)
MCV: 100.2 fL — ABNORMAL HIGH (ref 80.0–100.0)
Monocytes Absolute: 0.6 K/uL (ref 0.1–1.0)
Monocytes Relative: 7 %
Neutro Abs: 6 K/uL (ref 1.7–7.7)
Neutrophils Relative %: 66 %
Platelets: 303 K/uL (ref 150–400)
RBC: 4.32 MIL/uL (ref 3.87–5.11)
RDW: 14 % (ref 11.5–15.5)
WBC: 9.1 K/uL (ref 4.0–10.5)
nRBC: 0 % (ref 0.0–0.2)

## 2024-09-22 LAB — URINALYSIS, ROUTINE W REFLEX MICROSCOPIC
Bilirubin Urine: NEGATIVE
Glucose, UA: NEGATIVE mg/dL
Hgb urine dipstick: NEGATIVE
Ketones, ur: NEGATIVE mg/dL
Leukocytes,Ua: NEGATIVE
Nitrite: NEGATIVE
Protein, ur: NEGATIVE mg/dL
Specific Gravity, Urine: 1.01 (ref 1.005–1.030)
pH: 5 (ref 5.0–8.0)

## 2024-09-22 NOTE — Telephone Encounter (Signed)
 Agree with advice to go to ED.  She has history of multiple falls and high risk for fracture.  Wolm LELON Scarlet MD [facility]

## 2024-09-22 NOTE — Telephone Encounter (Signed)
 FYI Only or Action Required?: Action required by provider: ED advised but patient states she is waiting until after 12pm to go due to transportation being available after 12pm.  Patient was last seen in primary care on 09/09/2024 by Micheal Wolm ORN, MD.  Called Nurse Triage reporting Fall.  Symptoms began several days ago.  Interventions attempted: Rest, hydration, or home remedies.  Symptoms are: gradually worsening.  Triage Disposition: Go to ED Now (Notify PCP)  Patient/caregiver understands and will follow disposition?: Yes, but will wait                   Copied from CRM [id number]. Topic: Clinical - Red Word Triage >> Sep 22, 2024  9:37 AM [name] wrote: Red Word that prompted transfer to Nurse Triage: fell Friday night, fell twice Saturday, hurt her back, thought it was getting better, is having severe pain, is unable to sleep due to pain. Reason for Disposition  Sounds like a serious injury to the triager  Answer Assessment - Initial Assessment Questions Patient states she has been taking care of her sister in [place] who has Alzheimers. She stayed with her for two weeks. Patient has had falls---fell last Friday (4 days)--Fell two times on Saturday (3 days ago)  Patient states she is hurting at night and cannot sleep. She also states she hurts when she is sitting around. History of three back surgeries and two neck surgeries. Patient states that she has been off lately. She states she hasn't been as steady on her feet as she used to be. Patient states she did not lose consciousness or hit her head with any falls. She is having left sided back pain. Patient states frequent urination but she states this is not new. She denies blood in her urine. Patient states she uses a rollator to get around. She also states that she has been having trouble walking but it seems worse since these falls. She also states she has numbness in her feet and legs but states that  isn't new. There are no openings at patient's PCP office or any offices within the surrounding region. Patient is advised that given her medical/surgical history and symptoms of the severe pain from this fall--it is recommended that she goes to the Emergency Room. Patient states that she will go but it will have to be after 12pm when her friend is able to come take her to the Emergency Room. This RN offered to call an ambulance for the patient but she declined at this time and said she will wait for her friend to come after 12pm to take her to the ER. Patient is advised to call us  back if anything changes or with any further questions/concerns. Patient is advised that if anything worsens to call 911. Patient verbalized understanding.    Patient is advised to call us  back if anything changes or with any further questions/concerns. Patient is advised that if anything worsens to go to the Emergency Room. Patient verbalized understanding.  Protocols used: Back Injury-A-AH

## 2024-09-22 NOTE — ED Provider Triage Note (Signed)
 Emergency Medicine Provider Triage Evaluation Note  LAI HENDRIKS , a 83 y.o. female  was evaluated in triage.  Pt complains of history of multiple falls since this past Friday, complains of bilateral knee pain as well as lower back pain.  History of multiple back surgeries, no reports of urinary or bowel incontinence.  Reports wounds on the right knee.  Review of Systems  Positive: As above Negative:   Physical Exam  BP 101/75   Pulse (!) 54   Temp 98.4 F (36.9 C) (Oral)   Resp 16   SpO2 97%  Gen:   Awake, no distress   Resp:  Normal effort  MSK:   Moves extremities without difficulty, full passive range of motion bilateral knees without any crepitus, no gross deformities appreciated. Other:  Skin tear appreciated to the anterior right knee.  Medical Decision Making  Medically screening exam initiated at 3:32 PM.  Appropriate orders placed.  SHEKINA CORDELL was informed that the remainder of the evaluation will be completed by another provider, this initial triage assessment does not replace that evaluation, and the importance of remaining in the ED until their evaluation is complete.  Initial imaging ordered as well as initial labs.   Myriam Dorn BROCKS, [place] 09/22/24 1535

## 2024-09-22 NOTE — ED Triage Notes (Addendum)
 Here for multiple falls since Friday. Hit head on Friday, no HI the other times. No LOC but doesn't remember the first time she fell. Back pain, hx of back surgeries with multiple pin placements. Fall is due to weakness and legs giving out. No blood thinners.

## 2024-09-23 NOTE — ED Notes (Signed)
 PT stated she is leaving. PT seen walking out the front door.

## 2024-09-28 ENCOUNTER — Ambulatory Visit (INDEPENDENT_AMBULATORY_CARE_PROVIDER_SITE_OTHER): Admitting: Family Medicine

## 2024-09-28 ENCOUNTER — Encounter: Payer: Self-pay | Admitting: Family Medicine

## 2024-09-28 VITALS — BP 110/60 | HR 105 | Temp 98.2°F | Wt 123.6 lb

## 2024-09-28 DIAGNOSIS — M545 Low back pain, unspecified: Secondary | ICD-10-CM | POA: Diagnosis not present

## 2024-09-28 DIAGNOSIS — G8929 Other chronic pain: Secondary | ICD-10-CM | POA: Diagnosis not present

## 2024-09-28 DIAGNOSIS — R296 Repeated falls: Secondary | ICD-10-CM

## 2024-09-28 NOTE — Progress Notes (Signed)
 "  Established Patient Office Visit  Subjective   Patient ID: [name], female    DOB: [birth year]  Age: 84 y.o. MRN: [id number]  Chief Complaint  Patient presents with   Hospitalization Follow-up    HPI    [name] is seen today companied by a friend with multiple recent falls.  She had recent ER visit on 09-22-2024 and those notes and labs and images were reviewed.  She has multiple chronic problems including history of hypertension, temporal arteritis, cervical spondylosis with myelopathy, extensive back surgeries with hardware throughout thoracic and lumbar spine, history of rheumatoid arthritis, chronic pain disorder related to chronic back pain.  She has had multiple recent falls in the past few years and states that she had 3 falls within a period of a couple days which occurred prompting her ER visit.  She has a sister that lives up in [place]  who has dementia.  She has been going up there regularly to help her.  In trying to help reposition and lift her sister all 3 falls occurred.  She recalls hitting her head once.  There was no loss of consciousness.  She had some bilateral knee pain but especially involving the right knee.  Ambulates at baseline with a walker.  She had lab work in the ER including urinalysis, CBC, CMP.  No acute findings.  She had multiple x-rays including left and right knees which showed history of left total knee replacement and moderate osteoarthritis changes right knee without acute fracture.  CT head no acute findings.  She had CT cervical spine and CT lumbar spine which showed chronic changes and hardware from prior surgery but no acute fracture and hardware in place.  She has some nonspecific soreness at this time especially lower rib cage area bilaterally.  No visible bruising.  Denies any upper or lower extremity pain.  No headaches.  No dizziness.  She has had some mild cognitive impairment and has been referred to neurology  Past Medical  History:  Diagnosis Date   Allergic rhinitis    Anemia    Anxiety    Bronchiectasis    Carpal tunnel syndrome 07/13/2015   Bilateral   CVA (cerebral infarction) 10/2007   Right thalamic    Diverticulosis of colon    Gait disorder    GERD (gastroesophageal reflux disease)    History of breast cancer    HTN (hypertension)    Hyperlipidemia    Idiopathic pulmonary fibrosis    Left knee DJD    Migraine    Osteoporosis    Peripheral neuropathy    PMR (polymyalgia rheumatica)    Polyneuropathy in other diseases classified elsewhere 02/17/2013   Previous back surgery 10/09/12   Renal insufficiency    Rheumatoid arthritis (HCC)    Seizure disorder (HCC)    Spondylosis, cervical, with myelopathy 08/31/2015   C3-4 myelopathy   Temporal arteritis (HCC)    Right eye blind, 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   Type II or unspecified type diabetes mellitus without mention of complication, not stated as uncontrolled    2nd to steriods   Vitamin D deficiency    Past Surgical History:  Procedure Laterality Date   APPENDECTOMY  01/2021   CARPAL TUNNEL RELEASE Right    CATARACT EXTRACTION     OS - Summer 2010   CERVICAL FUSION  09/24/2008   4 rods and pins in place   CHOLECYSTECTOMY     COLONOSCOPY  12/15/2011   Procedure:  COLONOSCOPY;  Surgeon: Renaye Sous, MD;  Location: [facility];  Service: Endoscopy;  Laterality: N/A;   LUMBAR FUSION  04/24/2010   W/Mechanical fixation - L2-5 [facility]   LUMBAR FUSION  09/25/2011   rods in hips (to stabilize).   MASTECTOMY     NECK SURGERY     rheumatoid nodule removal     SPINAL FUSION  07/25/2010   T10-L2 interbody fusion / [facility]   TONSILLECTOMY     TOTAL KNEE ARTHROPLASTY     Left    reports that she has never smoked. She has never used smokeless tobacco. She reports that she does not currently use alcohol. She reports that she does not use drugs. family history includes Arthritis in her father and mother; Brain cancer  in her mother; Breast cancer in her sister; Dementia in her father; Hypertension in her father and mother; Lung cancer in her sister. Allergies[1]  Review of Systems  Constitutional:  Negative for fever.  Eyes:  Negative for blurred vision.  Respiratory:  Negative for shortness of breath.   Cardiovascular:  Negative for chest pain.  Gastrointestinal:  Negative for abdominal pain.  Genitourinary:  Negative for dysuria.  Neurological:  Negative for dizziness, weakness and headaches.      Objective:     BP 110/60   Pulse (!) 105   Temp 98.2 F (36.8 C) (Oral)   Wt 123 lb 9.6 oz (56.1 kg)   SpO2 95%   BMI 23.35 kg/m  BP Readings from Last 3 Encounters:  09/28/24 110/60  09/22/24 (!) 112/92  09/09/24 134/66   Wt Readings from Last 3 Encounters:  09/28/24 123 lb 9.6 oz (56.1 kg)  09/09/24 122 lb 11.2 oz (55.7 kg)  08/14/24 123 lb 12.8 oz (56.2 kg)      Physical Exam Vitals reviewed.  Constitutional:      General: She is not in acute distress.    Appearance: She is not ill-appearing.  HENT:     Head: Normocephalic and atraumatic.  Cardiovascular:     Rate and Rhythm: Normal rate.  Pulmonary:     Effort: Pulmonary effort is normal.  Musculoskeletal:     Comments: No visible bruising of the cervical, thoracic, or lumbar region.  No localized back tenderness.  Neurological:     General: No focal deficit present.     Mental Status: She is alert.     Cranial Nerves: No cranial nerve deficit.      No results found for any visits on 09/28/24.  Last CBC Lab Results  Component Value Date   WBC 9.1 09/22/2024   HGB 13.6 09/22/2024   HCT 43.3 09/22/2024   MCV 100.2 (H) 09/22/2024   MCH 31.5 09/22/2024   RDW 14.0 09/22/2024   PLT 303 09/22/2024   Last metabolic panel Lab Results  Component Value Date   GLUCOSE 94 09/22/2024   NA 141 09/22/2024   K 4.5 09/22/2024   CL 103 09/22/2024   CO2 28 09/22/2024   BUN 26 (H) 09/22/2024   CREATININE 1.47 (H)  09/22/2024   GFRNONAA 35 (L) 09/22/2024   CALCIUM  9.1 09/22/2024   PHOS 2.0 (L) 08/02/2022   PROT 7.4 09/22/2024   ALBUMIN 4.0 09/22/2024   LABGLOB 2.7 11/19/2023   BILITOT 0.8 09/22/2024   ALKPHOS 79 09/22/2024   AST 29 09/22/2024   ALT 18 09/22/2024   ANIONGAP 9 09/22/2024   Last lipids Lab Results  Component Value Date   CHOL 143 08/03/2022  HDL 47 08/03/2022   LDLCALC 85 08/03/2022   TRIG 56 08/03/2022   CHOLHDL 3.0 08/03/2022   Last hemoglobin A1c Lab Results  Component Value Date   HGBA1C 5.0 08/03/2022   Last thyroid  functions Lab Results  Component Value Date   TSH 2.490 11/19/2023      The ASCVD Risk score (Arnett DK, et al., 2019) failed to calculate for the following reasons:   The 2019 ASCVD risk score is only valid for ages 35 to 60   * - Cholesterol units were assumed    Assessment & Plan:   Patient is 84 year old female who is seen with multiple chronic problems as above including extensive history of back surgery with chronic back pain followed previously by neurosurgeon in [place].  She is on chronic oxycodone  therapy which she has been on for years.  Her recent falls have occurred in the setting of assisting her sister who has dementia and helping to try to reposition and lift her.  This has caused her to get off balance several times.  We strongly advocated that she not be left alone with her sister in the future and not do any lifting, pushing, pulling, or repositioning of her sister in any way.  She has extensive history of falls and very high risk.  We referred her multiple times to physical therapy.  We reviewed her recent extensive x-rays and labs and went over these with patient.  We spent over 30 minutes between face-to-face and non-face-to-face time including time reviewing recent studies and discussing fall prevention   [name] Scarlet, MD     [1]  Allergies Allergen Reactions   Atorvastatin  Nausea Only and Other (See Comments)     Dizzyness.    Hydromorphone Other (See Comments)    Cognitive changes  made me unconscious Dilaudid   "

## 2024-09-28 NOTE — Patient Instructions (Signed)
 It is my medical recommendation that you not be left alone again with Rock without someone else there to assist her with her physical needs.    I also recommend NO lifting, pushing,pulling,  or repositioning her secondary to recurrent falls.

## 2024-09-30 ENCOUNTER — Ambulatory Visit: Payer: Self-pay

## 2024-09-30 ENCOUNTER — Ambulatory Visit: Admitting: Family Medicine

## 2024-09-30 ENCOUNTER — Encounter: Payer: Self-pay | Admitting: Family Medicine

## 2024-09-30 VITALS — BP 110/60 | HR 88 | Temp 98.0°F | Resp 16 | Ht 61.0 in | Wt 125.2 lb

## 2024-09-30 DIAGNOSIS — R296 Repeated falls: Secondary | ICD-10-CM

## 2024-09-30 DIAGNOSIS — G319 Degenerative disease of nervous system, unspecified: Secondary | ICD-10-CM

## 2024-09-30 DIAGNOSIS — R4189 Other symptoms and signs involving cognitive functions and awareness: Secondary | ICD-10-CM | POA: Diagnosis not present

## 2024-09-30 NOTE — Telephone Encounter (Signed)
 FYI Only or Action Required?: FYI only for provider: appointment scheduled on 1/7, pt is having worsening confusion, A&O x3.  Patient was last seen in primary care on 09/28/2024 by [name] ORN, MD.  Called Nurse Triage reporting Memory Loss.  Symptoms began several months ago.  Interventions attempted: Other: Pt makes lists.  Symptoms are: gradually worsening.  Triage Disposition: See Physician Within 24 Hours  Patient/caregiver understands and will follow disposition?: Yes     Copied from CRM #[id number]. Topic: Clinical - Red Word Triage >> Sep 30, 2024  8:14 AM Donna BRAVO wrote: Red Word that prompted transfer to Nurse Triage:  having issues with memory. Can't remember if she takes a shot today. Having more trouble remembering things Reason for Disposition  [1] Confusion getting worse AND [2] slow onset (days to weeks)  Answer Assessment - Initial Assessment Questions This RN recommended pt be examined in next 24 hours, scheduled for today 1/7. Advised call back or seek immediate care if new or worsening symptoms. Sending message for PCP office awareness and for call back to pt with clarification on if there is a shot she needs to take today, per pt request. Placed in call back to ensure pt got ride to appt.    Answered name and DOB easily Having trouble remembering things Had an appt with Dr. Micheal Monday for something different, forgot to mention that having trouble with memory Couldn't remember if supposed to go have my shot, can't remember what the shot's for Seems like I did go every day Been going on for past several months at least 3-4 months, just little things Never have had trouble remembering what medicine I take, have a list somewhere I've got that, I do know the meds I take every day at home, but seems like used to have to go somewhere to get a shot Speech is the same No SOB, no new numbness/weakness No cold/clammy skin, heart racing No family hx of memory  loss that know of Not seeing hearing things that aren't there No fever Really nervous about it Just seems like something I have to take, a shot, and have to go somewhere to get it, can't remember where it is I go No one to take her there, do not have anyone, haven't been driving because haven't felt real good, haven't been driving for a week Can't think today, can't even think that (for what symptoms been having this week), that's what worrying me Haven't had this problem where can't think what supposed to do today Don't want EMS Have a friend in [place] that might could take me Don't have any problem going where I need to go Hate to have another expense Little things that feel keep having Per OV 09/28/24 sister has dementia Might have dreamed it Don't know exactly how it started for sister, lives in [place] No cough, fever, chest pain Feel normal like always do, know I have to take medicine, has all meds written down but keeps feeling like she has shot she's supposed to get today Accepting call back to ensure ride for appt Leonia Frohlich is president, knows it's January Wants clarification on what shot she needs and when if she needs one  Protocols used: Dementia Symptoms and Questions-A-AH

## 2024-09-30 NOTE — Telephone Encounter (Signed)
 Attempted to contact pt, as requested, to see if got ride for appt this afternoon. No answer, LVM for call back to PCP office to confirm.

## 2024-09-30 NOTE — Patient Instructions (Signed)
 A few things to remember from today's visit:  Concern about memory Eat more protein. Decrease sweat tea. Fall precautions. Write things down. Cognitive challenging games may help. Keep appt with PCP and neurologist.  If you need refills for medications you take chronically, please call your pharmacy. Do not use My Chart to request refills or for acute issues that need immediate attention. If you send a my chart message, it may take a few days to be addressed, specially if I am not in the office.  Please be sure medication list is accurate. If a new problem present, please set up appointment sooner than planned today.

## 2024-09-30 NOTE — Telephone Encounter (Signed)
 Patient calling back in to cancel her appointment today because she states she thinks she called the office too quickly/panicked. She does not think this need to be evaluated. She states she must have had a dream that she was supposed to have a shot. She states she can find no record and so it must've been a dream. She states she sometimes forgets things, other than today's episode. RN advised patient to keep the appointment to be evaluated for symptoms. Patient agreeable to come in today.  [name] M   09/30/2024  9:19 AM  Patient calling back to cancel appointment scheduled by NT states she found the list of medications from appointment on 1/5, states she must have dreamed about what she called about there's no record or what she called about. States she reacted too quick and called has list of medications.

## 2024-09-30 NOTE — Progress Notes (Signed)
 "   ACUTE VISIT Chief Complaint  Patient presents with   Memory Loss    Increase memory loss and confusion    Discussed the use of AI scribe software for clinical note transcription with the patient, who gave verbal consent to proceed.  History of Present Illness [name] is an 84 year old female with past medical history significant for hypertension, temporal arteritis, GERD, RA, pulmonary fibrosis, hyperlipidemia, and MCI here today with concerns about memory loss.  She experiences intermittent episodes of memory difficulties and confusion, particularly in the mornings, and sometimes forgets plans or appointments. These episodes have been occurring for several months. She recalls a recent incident where she believed she had an appointment for a shot but could not remember the details. States that she talks with her friend, who lives in [place]  and sometimes she cannot recall events that happens years ago but later she does remember. She has not being told she is repeating herself or making same comment/asking same questions during a long conversation.  She lives alone following the passing of her husband in 2009. Her son checks on her regularly. She occasionally experiences headaches but no more than usual. Denies shortness of breath, cough, wheezing, abdominal pain, nausea, or vomiting.  She reports a history of falls, having fallen three times over Christmas, resulting in back pain and a head injury.  She had a head CT 09/22/24 after a fall and head injury, negative for acute process.  No acute hemorrhage. No evidence of acute infarct. No hydrocephalus. No extra-axial collection. No mass effect or midline shift. Proportional prominence of ventricles and sulci, consistent with diffuse cerebral parenchymal volume loss. Scattered periventricular white matter hypoattenuation, consistent with moderate chronic ischemic microvascular disease, similar to prior. Calcified atherosclerotic  plaque within cavernous/supraclinoid internal carotid and vertebral arteries.  She attributes her falls to moving too quickly and getting her feet tangled. RA and OA, uses a walker for assistance.  Her sister, who she cares for on weekends, has undiagnosed dementia, and her father also passed away with dementia, raising her concerns about her own memory issues. She is 84 years old, while her sister is 25 or 12.  She recalls details of her recent appointment for back pain following her falls.   She takes several vitamins including B12, B1, B6, and vitamin C, and does not consume alcohol.  Her diet is noted to be lacking in protein, as she reports eating cookies with peanut butter and peach jelly for breakfast and lunch. She drinks a lot of decaffeinated sweet tea and acknowledges needing to drink more water.  Lab Results  Component Value Date   VITAMINB12 >2000 (H) 02/25/2024   Lab Results  Component Value Date   TSH 2.490 11/19/2023   Review of Systems  Constitutional:  Negative for activity change, appetite change, chills and fever.  HENT:  Negative for sore throat.   Cardiovascular:  Negative for chest pain and palpitations.  Gastrointestinal:  Negative for abdominal pain.  Genitourinary:  Negative for decreased urine volume, dysuria and hematuria.  Musculoskeletal:  Positive for arthralgias and gait problem.  Skin:  Negative for rash.  Neurological:  Negative for syncope and facial asymmetry.  Psychiatric/Behavioral:  Negative for hallucinations.   See other pertinent positives and negatives in HPI.  Medications Ordered Prior to Encounter[1]  Past Medical History:  Diagnosis Date   Allergic rhinitis    Anemia    Anxiety    Bronchiectasis    Carpal tunnel syndrome 07/13/2015  Bilateral   CVA (cerebral infarction) 10/2007   Right thalamic    Diverticulosis of colon    Gait disorder    GERD (gastroesophageal reflux disease)    History of breast cancer    HTN  (hypertension)    Hyperlipidemia    Idiopathic pulmonary fibrosis    Left knee DJD    Migraine    Osteoporosis    Peripheral neuropathy    PMR (polymyalgia rheumatica)    Polyneuropathy in other diseases classified elsewhere 02/17/2013   Previous back surgery 10/09/12   Renal insufficiency    Rheumatoid arthritis (HCC)    Seizure disorder (HCC)    Spondylosis, cervical, with myelopathy 08/31/2015   C3-4 myelopathy   Temporal arteritis (HCC)    Right eye blind, 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   Type II or unspecified type diabetes mellitus without mention of complication, not stated as uncontrolled    2nd to steriods   Vitamin D deficiency    Allergies[2]  Social History   Socioeconomic History   Marital status: Widowed    Spouse name: Not on file   Number of children: 1   Years of education: 12+ coll.   Highest education level: Not on file  Occupational History   Occupation: Clinical Cytogeneticist: RETIRED    Comment: retired  Tobacco Use   Smoking status: Never   Smokeless tobacco: Never   Tobacco comments:    Father   Vaping Use   Vaping status: Never Used  Substance and Sexual Activity   Alcohol use: Not Currently    Comment: heavy drinker until 1995 - sobriety with AA   Drug use: No   Sexual activity: Not Currently  Other Topics Concern   Not on file  Social History Narrative   07/03/21 friend Avelina living with her   Right handed   Drinks 6-8 cups caffeine daily      Social Drivers of Health   Tobacco Use: Low Risk (09/30/2024)   Patient History    Smoking Tobacco Use: Never    Smokeless Tobacco Use: Never    Passive Exposure: Not on file  Financial Resource Strain: Low Risk (07/12/2023)   Overall Financial Resource Strain (CARDIA)    Difficulty of Paying Living Expenses: Not hard at all  Food Insecurity: No Food Insecurity (07/12/2023)   Hunger Vital Sign    Worried About Running Out of Food in the Last Year:  Never true    Ran Out of Food in the Last Year: Never true  Transportation Needs: No Transportation Needs (07/12/2023)   PRAPARE - Administrator, Civil Service (Medical): No    Lack of Transportation (Non-Medical): No  Physical Activity: Inactive (07/12/2023)   Exercise Vital Sign    Days of Exercise per Week: 0 days    Minutes of Exercise per Session: 0 min  Stress: No Stress Concern Present (07/12/2023)   [employer] of Occupational Health - Occupational Stress Questionnaire    Feeling of Stress : Not at all  Social Connections: Moderately Integrated (07/12/2023)   Social Connection and Isolation Panel    Frequency of Communication with Friends and Family: More than three times a week    Frequency of Social Gatherings with Friends and Family: More than three times a week    Attends Religious Services: More than 4 times per year    Active Member of [employer] or Organizations: Yes    Attends Banker  Meetings: More than 4 times per year    Marital Status: Widowed  Depression (PHQ2-9): Low Risk (07/17/2024)   Depression (PHQ2-9)    PHQ-2 Score: 0  Alcohol Screen: Low Risk (07/12/2023)   Alcohol Screen    Last Alcohol Screening Score (AUDIT): 0  Housing: Low Risk (07/12/2023)   Housing    Last Housing Risk Score: 0  Utilities: Not At Risk (07/12/2023)   AHC Utilities    Threatened with loss of utilities: No  Health Literacy: Adequate Health Literacy (07/12/2023)   B1300 Health Literacy    Frequency of need for help with medical instructions: Never   Vitals:   09/30/24 1446  BP: 110/60  Pulse: 88  Resp: 16  Temp: 98 F (36.7 C)  SpO2: 97%   Body mass index is 23.66 kg/m.  Physical Exam Vitals and nursing note reviewed.  Constitutional:      General: She is not in acute distress.    Appearance: She is well-developed.  HENT:     Head: Normocephalic and atraumatic.     Mouth/Throat:     Mouth: Mucous membranes are dry.     Pharynx:  Oropharynx is clear.  Eyes:     Conjunctiva/sclera: Conjunctivae normal.  Cardiovascular:     Rate and Rhythm: Normal rate and regular rhythm. Occasional Extrasystoles are present.    Heart sounds: No murmur heard.    Comments: Trace pitting LE edema, bilateral. Pulmonary:     Effort: Pulmonary effort is normal. No respiratory distress.     Breath sounds: Normal breath sounds.  Abdominal:     Palpations: Abdomen is soft.     Tenderness: There is no abdominal tenderness.  Skin:    General: Skin is warm.     Findings: No erythema or rash.  Neurological:     General: No focal deficit present.     Mental Status: She is alert and oriented to person, place, and time.     Comments: Gait assisted with a walker.  Psychiatric:        Mood and Affect: Affect normal. Mood is anxious.    ASSESSMENT AND PLAN:  [name] was seen today for memory loss.  Diagnoses and all orders for this visit:  Concern about memory  Neurodegenerative cognitive impairment We discussed possible etiologies. History and examination today does not suggest an acute serious process. It seems like she has more difficulties with her remote/past memory than with short term memory.  On past medical history there is a history of MCI.  Today she is oriented x 3, she remembers what she ate for breakfast, and can give me details about her recent appointment with PCP. I do not think blood work or imaging are needed at this time. She has family history of dementia, her mother and younger sister. Instructed to continue monitoring for new symptoms. Cognitive challenging exercises may help. Keeping a diary of daily activities, foods she eats, and  completed and pending chores day may help. We discussed the importance of adequate protein intake, decreasing sweet tea, increasing water intake.  She has a friend in [place] , with whom she communicates frequently, recommended doing so daily, if possible. She has an  appointment with PCP in a few days and appointment with neurologist in 12/2024.If needed she can request an earlier appt with Dr Onita, her neurologist. Clearly instructed about warning signs.  Frequent falls  So far she has not had a serious injury. Several falls in the past month.  She  acknowledges sometimes she moves fast and does rush causing some of her recent falls. Fall precaution discussed. She needs to consider a medical alarm system device. PT has been recommended by PCP but it seems like she has cancelled some PT appts. A few of her comorbidities increased her risk for falls.  I personally spent a total of 34 minutes in the care of the patient today including preparing to see the patient, getting/reviewing separately obtained history, performing a medically appropriate exam/evaluation, counseling and educating, and documenting clinical information in the EHR.  Return if symptoms worsen or fail to improve.  Patric Buckhalter G. Bobbi Kozakiewicz, MD  [facility] [place] office.     [1]  Current Outpatient Medications on File Prior to Visit  Medication Sig Dispense Refill   cadexomer iodine  (IODOSORB) 0.9 % gel Apply 1 Application topically daily as needed for wound care. 40 g 0   Calcium  Carb-Cholecalciferol  (CALCIUM  CARBONATE-VITAMIN D3 PO) Take 1 tablet by mouth daily     cholecalciferol  (VITAMIN D) 1000 UNITS tablet Take 1,000 Units by mouth daily.     denosumab  (PROLIA ) 60 MG/ML SOLN injection Inject 60 mg into the skin every 6 (six) months.     docusate sodium  (COLACE) 100 MG capsule Take 100 mg by mouth 2 (two) times daily.     furosemide  (LASIX ) 20 MG tablet Take 1 tablet (20 mg total) by mouth daily. 90 tablet 3   gabapentin  (NEURONTIN ) 300 MG capsule TAKE 1 CAPSULE TWICE A DAY 180 capsule 3   Golimumab  (SIMPONI  ARIA IV) Inject into the vein. Takes infusion every 8 wks.     losartan  (COZAAR ) 50 MG tablet Take 1 tablet (50 mg total) by mouth daily. 90 tablet 3   Multiple  Vitamins-Minerals (MULTIVITAMIN,TX-MINERALS) tablet Take 1 tablet by mouth daily.     Omega-3 Fatty Acids (FISH OIL OMEGA-3 PO) Take 1 capsule by mouth daily.     oxyCODONE  (OXYCONTIN ) 10 mg 12 hr tablet Take 1 tablet (10 mg total) by mouth every 12 (twelve) hours. 180 tablet 0   Oxycodone  HCl 10 MG TABS Take 1 tablet (10 mg total) by mouth every 6 (six) hours as needed. Take 1 tablet by mouth every 6 hours as needed for breakthrough pain 20 tablet 0   potassium chloride  (KLOR-CON  M) 10 MEQ tablet TAKE 1 TABLET TWICE A DAY 180 tablet 3   predniSONE  (DELTASONE ) 1 MG tablet TAKE 4 TABLETS DAILY WITH BREAKFAST 360 tablet 3   Vibegron  (GEMTESA ) 75 MG TABS Take 1 tablet (75 mg total) by mouth daily at 2 am. 30 tablet 5   vitamin B-12 (CYANOCOBALAMIN ) 100 MCG tablet Take 100 mcg by mouth daily.     No current facility-administered medications on file prior to visit.  [2]  Allergies Allergen Reactions   Atorvastatin  Nausea Only and Other (See Comments)    Dizzyness.    Hydromorphone Other (See Comments)    Cognitive changes  made me unconscious Dilaudid   "

## 2024-10-07 ENCOUNTER — Encounter (HOSPITAL_BASED_OUTPATIENT_CLINIC_OR_DEPARTMENT_OTHER): Admitting: Physical Therapy

## 2024-10-07 ENCOUNTER — Ambulatory Visit: Admitting: Family Medicine

## 2024-10-09 ENCOUNTER — Encounter (HOSPITAL_BASED_OUTPATIENT_CLINIC_OR_DEPARTMENT_OTHER): Admitting: Physical Therapy

## 2024-10-09 ENCOUNTER — Ambulatory Visit: Admitting: Family Medicine

## 2024-10-12 ENCOUNTER — Telehealth: Payer: Self-pay

## 2024-10-12 ENCOUNTER — Ambulatory Visit (INDEPENDENT_AMBULATORY_CARE_PROVIDER_SITE_OTHER): Payer: Self-pay

## 2024-10-12 VITALS — BP 120/62 | HR 86 | Wt 125.0 lb

## 2024-10-12 DIAGNOSIS — Q178 Other specified congenital malformations of ear: Secondary | ICD-10-CM

## 2024-10-12 NOTE — Telephone Encounter (Signed)
 Px has to be postponed pt needs clearance first to see if she can move fwd, lvmail with pt to call the office

## 2024-10-12 NOTE — Progress Notes (Signed)
 "  New Patient Office Visit  Subjective    Patient ID: [name], female    DOB: [birth year]  Age: 84 y.o. MRN: [id number]  CC:  Chief Complaint  Patient presents with   Skin Problem    HPI SHAWNETTE [name] presents to establish care 84 year old female who presents with a left split earlobe.  Patient would like the earlobe repaired as she is not able to wear earrings on that side.  Right side is intact.  Patient is on no anticoagulation.  Patient does not take aspirin .  Patient does not smoke.  Patient is on golimumab  every 8 weeks and Deltasone  for rheumatoid arthritis .  Outpatient Encounter Medications as of 10/12/2024  Medication Sig   cadexomer iodine  (IODOSORB) 0.9 % gel Apply 1 Application topically daily as needed for wound care.   Calcium  Carb-Cholecalciferol  (CALCIUM  CARBONATE-VITAMIN D3 PO) Take 1 tablet by mouth daily   cholecalciferol  (VITAMIN D) 1000 UNITS tablet Take 1,000 Units by mouth daily.   denosumab  (PROLIA ) 60 MG/ML SOLN injection Inject 60 mg into the skin every 6 (six) months.   docusate sodium  (COLACE) 100 MG capsule Take 100 mg by mouth 2 (two) times daily.   furosemide  (LASIX ) 20 MG tablet Take 1 tablet (20 mg total) by mouth daily.   gabapentin  (NEURONTIN ) 300 MG capsule TAKE 1 CAPSULE TWICE A DAY   Golimumab  (SIMPONI  ARIA IV) Inject into the vein. Takes infusion every 8 wks.   losartan  (COZAAR ) 50 MG tablet Take 1 tablet (50 mg total) by mouth daily.   Multiple Vitamins-Minerals (MULTIVITAMIN,TX-MINERALS) tablet Take 1 tablet by mouth daily.   Omega-3 Fatty Acids (FISH OIL OMEGA-3 PO) Take 1 capsule by mouth daily.   oxyCODONE  (OXYCONTIN ) 10 mg 12 hr tablet Take 1 tablet (10 mg total) by mouth every 12 (twelve) hours.   Oxycodone  HCl 10 MG TABS Take 1 tablet (10 mg total) by mouth every 6 (six) hours as needed. Take 1 tablet by mouth every 6 hours as needed for breakthrough pain   potassium chloride  (KLOR-CON  M) 10 MEQ tablet TAKE 1 TABLET TWICE A DAY    predniSONE  (DELTASONE ) 1 MG tablet TAKE 4 TABLETS DAILY WITH BREAKFAST   Vibegron  (GEMTESA ) 75 MG TABS Take 1 tablet (75 mg total) by mouth daily at 2 am.   vitamin B-12 (CYANOCOBALAMIN ) 100 MCG tablet Take 100 mcg by mouth daily.   No facility-administered encounter medications on file as of 10/12/2024.    Past Medical History:  Diagnosis Date   Allergic rhinitis    Anemia    Anxiety    Bronchiectasis    Carpal tunnel syndrome 07/13/2015   Bilateral   CVA (cerebral infarction) 10/2007   Right thalamic    Diverticulosis of colon    Gait disorder    GERD (gastroesophageal reflux disease)    History of breast cancer    HTN (hypertension)    Hyperlipidemia    Idiopathic pulmonary fibrosis    Left knee DJD    Migraine    Osteoporosis    Peripheral neuropathy    PMR (polymyalgia rheumatica)    Polyneuropathy in other diseases classified elsewhere 02/17/2013   Previous back surgery 10/09/12   Renal insufficiency    Rheumatoid arthritis (HCC)    Seizure disorder (HCC)    Spondylosis, cervical, with myelopathy 08/31/2015   C3-4 myelopathy   Temporal arteritis (HCC)    Right eye blind, 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   Type II or unspecified type diabetes  mellitus without mention of complication, not stated as uncontrolled    2nd to steriods   Vitamin D deficiency     Past Surgical History:  Procedure Laterality Date   APPENDECTOMY  01/2021   CARPAL TUNNEL RELEASE Right    CATARACT EXTRACTION     OS - Summer 2010   CERVICAL FUSION  09/24/2008   4 rods and pins in place   CHOLECYSTECTOMY     COLONOSCOPY  12/15/2011   Procedure: COLONOSCOPY;  Surgeon: Renaye Sous, MD;  Location: [facility];  Service: Endoscopy;  Laterality: N/A;   LUMBAR FUSION  04/24/2010   W/Mechanical fixation - L2-5 [facility]   LUMBAR FUSION  09/25/2011   rods in hips (to stabilize).   MASTECTOMY     NECK SURGERY     rheumatoid nodule removal     SPINAL FUSION  07/25/2010   T10-L2  interbody fusion / [facility]   TONSILLECTOMY     TOTAL KNEE ARTHROPLASTY     Left    Family History  Problem Relation Age of Onset   Brain cancer Mother    Hypertension Mother    Arthritis Mother    Dementia Father    Hypertension Father    Arthritis Father    Breast cancer Sister    Lung cancer Sister    Lung disease Neg Hx    Rheumatologic disease Neg Hx     Social History   Socioeconomic History   Marital status: Widowed    Spouse name: Not on file   Number of children: 1   Years of education: 12+ coll.   Highest education level: Not on file  Occupational History   Occupation: Clinical Cytogeneticist: RETIRED    Comment: retired  Tobacco Use   Smoking status: Never   Smokeless tobacco: Never   Tobacco comments:    Father   Vaping Use   Vaping status: Never Used  Substance and Sexual Activity   Alcohol use: Not Currently    Comment: heavy drinker until 1995 - sobriety with AA   Drug use: No   Sexual activity: Not Currently  Other Topics Concern   Not on file  Social History Narrative   07/03/21 friend Avelina living with her   Right handed   Drinks 6-8 cups caffeine daily      Social Drivers of Health   Tobacco Use: Low Risk (09/30/2024)   Patient History    Smoking Tobacco Use: Never    Smokeless Tobacco Use: Never    Passive Exposure: Not on file  Financial Resource Strain: Low Risk (07/12/2023)   Overall Financial Resource Strain (CARDIA)    Difficulty of Paying Living Expenses: Not hard at all  Food Insecurity: No Food Insecurity (07/12/2023)   Hunger Vital Sign    Worried About Running Out of Food in the Last Year: Never true    Ran Out of Food in the Last Year: Never true  Transportation Needs: No Transportation Needs (07/12/2023)   PRAPARE - Administrator, Civil Service (Medical): No    Lack of Transportation (Non-Medical): No  Physical Activity: Inactive (07/12/2023)   Exercise Vital  Sign    Days of Exercise per Week: 0 days    Minutes of Exercise per Session: 0 min  Stress: No Stress Concern Present (07/12/2023)   [employer] of Occupational Health - Occupational Stress Questionnaire    Feeling of Stress : Not at all  Social  Connections: Moderately Integrated (07/12/2023)   Social Connection and Isolation Panel    Frequency of Communication with Friends and Family: More than three times a week    Frequency of Social Gatherings with Friends and Family: More than three times a week    Attends Religious Services: More than 4 times per year    Active Member of [employer] or Organizations: Yes    Attends Banker Meetings: More than 4 times per year    Marital Status: Widowed  Intimate Partner Violence: Not At Risk (07/12/2023)   Humiliation, Afraid, Rape, and Kick questionnaire    Fear of Current or Ex-Partner: No    Emotionally Abused: No    Physically Abused: No    Sexually Abused: No  Depression (PHQ2-9): Low Risk (07/17/2024)   Depression (PHQ2-9)    PHQ-2 Score: 0  Alcohol Screen: Low Risk (07/12/2023)   Alcohol Screen    Last Alcohol Screening Score (AUDIT): 0  Housing: Low Risk (07/12/2023)   Housing    Last Housing Risk Score: 0  Utilities: Not At Risk (07/12/2023)   AHC Utilities    Threatened with loss of utilities: No  Health Literacy: Adequate Health Literacy (07/12/2023)   B1300 Health Literacy    Frequency of need for help with medical instructions: Never    ROS ROS negative except as noted in HPI     Objective    BP 120/62 (BP Location: Right Arm, Patient Position: Sitting, Cuff Size: Normal)   Pulse 86   Wt 125 lb (56.7 kg)   SpO2 100%   BMI 23.62 kg/m   Physical Exam      Assessment & Plan:   Problem List Items Addressed This Visit   None Visit Diagnoses       Split ear lobe    -  Primary   Relevant Orders   Ambulatory referral to Rheumatology      Patient wants to undergo left ear lobe repair. To  allow the wound to heal patient will need to stop or decrease dose of Deltasone  and stop golimumab  6 weeks before and after procedure. We will consult rheumatology for recommendations. Patient has been notified and will be scheduled the procedure after the immunosuppression recommendations are in place. We will also prescribe Vitamin A before the procedure.   Simran Mannis M Rayven Hendrickson, MD   "

## 2024-10-14 ENCOUNTER — Encounter: Payer: Self-pay | Admitting: Family Medicine

## 2024-10-14 ENCOUNTER — Encounter (HOSPITAL_BASED_OUTPATIENT_CLINIC_OR_DEPARTMENT_OTHER): Admitting: Physical Therapy

## 2024-10-14 ENCOUNTER — Ambulatory Visit: Admitting: Family Medicine

## 2024-10-14 VITALS — BP 118/60 | HR 82 | Temp 98.0°F | Wt 126.1 lb

## 2024-10-14 DIAGNOSIS — R296 Repeated falls: Secondary | ICD-10-CM

## 2024-10-14 DIAGNOSIS — M545 Low back pain, unspecified: Secondary | ICD-10-CM

## 2024-10-14 DIAGNOSIS — R3915 Urgency of urination: Secondary | ICD-10-CM | POA: Diagnosis not present

## 2024-10-14 DIAGNOSIS — R197 Diarrhea, unspecified: Secondary | ICD-10-CM | POA: Diagnosis not present

## 2024-10-14 DIAGNOSIS — G8929 Other chronic pain: Secondary | ICD-10-CM

## 2024-10-14 MED ORDER — OXYCODONE HCL ER 10 MG PO T12A: 10.0000 mg | EXTENDED_RELEASE_TABLET | Freq: Two times a day (BID) | ORAL | 0 refills | Status: AC

## 2024-10-14 NOTE — Progress Notes (Unsigned)
 "  Established Patient Office Visit  Subjective   Patient ID: [name], female    DOB: 22-Dec-1940  Age: 84 y.o. MRN: [id number]  Chief Complaint  Patient presents with   Medical Management of Chronic Issues    HPI  {History (Optional):23778}  [name] is here for chronic medical follow-up.  Multiple recent visits.  She has ongoing chronic low back pain and has been on OxyContin  for many years.  She still has some pain in spite of that and gabapentin  300 mg twice daily.  She especially has some night pain and works hard to try to find positions that are tolerable.  Denies any recent injury.  No falls since last visit.  Has had multiple falls within the past year.  She does have recent new issue of diarrhea for the past few days.  Symptoms actually improved today.  No recent travels.  No fever.  No bloody stools.  No recent antibiotics.  Took some Kaopectate which may have helped.  No new medication changes.  She had questions regarding Gemtesa .  She has had urine urgency in the past.  No significant nocturia currently.  Indication for chronic opioid: Chronic back pain with multiple prior surgeries Medication and dose: OxyContin  10 mg twice daily # pills per month: 60 Last UDS date: 11/25 Opioid Treatment Agreement signed (Y/N): yes Opioid Treatment Agreement last reviewed with patient:   NCCS[facility] reviewed this encounter (include red flags): Yes no red flags  Past Medical History:  Diagnosis Date   Allergic rhinitis    Anemia    Anxiety    Bronchiectasis    Carpal tunnel syndrome 07/13/2015   Bilateral   CVA (cerebral infarction) 10/2007   Right thalamic    Diverticulosis of colon    Gait disorder    GERD (gastroesophageal reflux disease)    History of breast cancer    HTN (hypertension)    Hyperlipidemia    Idiopathic pulmonary fibrosis    Left knee DJD    Migraine    Osteoporosis    Peripheral neuropathy    PMR (polymyalgia rheumatica)    Polyneuropathy in  other diseases classified elsewhere 02/17/2013   Previous back surgery 10/09/12   Renal insufficiency    Rheumatoid arthritis (HCC)    Seizure disorder (HCC)    Spondylosis, cervical, with myelopathy 08/31/2015   C3-4 myelopathy   Temporal arteritis (HCC)    Right eye blind, 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   Type II or unspecified type diabetes mellitus without mention of complication, not stated as uncontrolled    2nd to steriods   Vitamin D deficiency    Past Surgical History:  Procedure Laterality Date   APPENDECTOMY  01/2021   CARPAL TUNNEL RELEASE Right    CATARACT EXTRACTION     OS - Summer 2010   CERVICAL FUSION  09/24/2008   4 rods and pins in place   CHOLECYSTECTOMY     COLONOSCOPY  12/15/2011   Procedure: COLONOSCOPY;  Surgeon: Renaye Sous, MD;  Location: [facility];  Service: Endoscopy;  Laterality: N/A;   LUMBAR FUSION  04/24/2010   W/Mechanical fixation - L2-5 [facility]   LUMBAR FUSION  09/25/2011   rods in hips (to stabilize).   MASTECTOMY     NECK SURGERY     rheumatoid nodule removal     SPINAL FUSION  07/25/2010   T10-L2 interbody fusion / [facility]   TONSILLECTOMY     TOTAL KNEE ARTHROPLASTY  Left    reports that she has never smoked. She has never used smokeless tobacco. She reports that she does not currently use alcohol. She reports that she does not use drugs. family history includes Arthritis in her father and mother; Brain cancer in her mother; Breast cancer in her sister; Dementia in her father; Hypertension in her father and mother; Lung cancer in her sister. Allergies[1]   Review of Systems  Constitutional:  Negative for fever.  Eyes:  Negative for blurred vision.  Respiratory:  Negative for shortness of breath.   Cardiovascular:  Negative for chest pain.  Gastrointestinal:  Positive for diarrhea. Negative for nausea and vomiting.  Genitourinary:  Negative for dysuria.  Musculoskeletal:  Positive for back pain.   Neurological:  Negative for dizziness, weakness and headaches.      Objective:     BP 118/60 (BP Location: Right Arm, Cuff Size: Normal)   Pulse 82   Temp 98 F (36.7 C) (Oral)   Wt 126 lb 1.6 oz (57.2 kg)   SpO2 96%   BMI 23.83 kg/m  BP Readings from Last 3 Encounters:  10/14/24 118/60  10/12/24 120/62  09/30/24 110/60   Wt Readings from Last 3 Encounters:  10/14/24 126 lb 1.6 oz (57.2 kg)  10/12/24 125 lb (56.7 kg)  09/30/24 125 lb 3.2 oz (56.8 kg)      Physical Exam Vitals reviewed.  Constitutional:      General: She is not in acute distress.    Appearance: She is not ill-appearing.  Cardiovascular:     Rate and Rhythm: Normal rate and regular rhythm.  Pulmonary:     Effort: Pulmonary effort is normal.     Breath sounds: Normal breath sounds. No wheezing or rales.  Neurological:     Mental Status: She is alert.      No results found for any visits on 10/14/24.  Last CBC Lab Results  Component Value Date   WBC 9.1 09/22/2024   HGB 13.6 09/22/2024   HCT 43.3 09/22/2024   MCV 100.2 (H) 09/22/2024   MCH 31.5 09/22/2024   RDW 14.0 09/22/2024   PLT 303 09/22/2024   Last metabolic panel Lab Results  Component Value Date   GLUCOSE 94 09/22/2024   NA 141 09/22/2024   K 4.5 09/22/2024   CL 103 09/22/2024   CO2 28 09/22/2024   BUN 26 (H) 09/22/2024   CREATININE 1.47 (H) 09/22/2024   GFRNONAA 35 (L) 09/22/2024   CALCIUM  9.1 09/22/2024   PHOS 2.0 (L) 08/02/2022   PROT 7.4 09/22/2024   ALBUMIN 4.0 09/22/2024   LABGLOB 2.7 11/19/2023   BILITOT 0.8 09/22/2024   ALKPHOS 79 09/22/2024   AST 29 09/22/2024   ALT 18 09/22/2024   ANIONGAP 9 09/22/2024   Last thyroid  functions Lab Results  Component Value Date   TSH 2.490 11/19/2023   Last vitamin B12 and Folate Lab Results  Component Value Date   VITAMINB12 >2000 (H) 02/25/2024   FOLATE  11/03/2007    10.0 (NOTE)  Reference Ranges        Deficient:       0.4 - 3.4 ng/mL        Indeterminate:    3.4 - 5.4 ng/mL        Normal:              > 5.4 ng/mL      The ASCVD Risk score (Arnett DK, et al., 2019) failed to calculate for the following reasons:  The 2019 ASCVD risk score is only valid for ages 11 to 36   * - Cholesterol units were assumed    Assessment & Plan:   #1 chronic back pain.  Chronic opioid therapy.  She still has some breakthrough pain in spite of OxyContin  and gabapentin .  Overall though pain stable.  Refill OxyContin  for 3 months.  She had recent drug screen 11/25 with no concerns.  PDMP reviewed with no concerns  #2 diarrhea.  Recent acute diarrhea which is actually improved today.  Does not any red flags such as fever, bloody stools, abdominal pain, recent antibiotics, or recent travels.  Continue bland diet for the next few days and be in touch if not continuing to improve  #3 urinary urgency stable on Gemtesa .  Continue Gemtesa  75 mg once daily  #4 history of multiple falls.  We talked about risk factors for fall.  Several of her falls occurred when trying to assist her sister who has dementia who lives up in [place].  She has since talked to the family and has informed them that she cannot help physically with maneuvering her sister in the future  Wolm Scarlet, MD     [1]  Allergies Allergen Reactions   Atorvastatin  Nausea Only and Other (See Comments)    Dizzyness.    Hydromorphone Other (See Comments)    Cognitive changes  made me unconscious Dilaudid   "

## 2024-10-16 ENCOUNTER — Encounter (HOSPITAL_BASED_OUTPATIENT_CLINIC_OR_DEPARTMENT_OTHER): Admitting: Physical Therapy

## 2024-10-20 ENCOUNTER — Ambulatory Visit

## 2024-10-23 ENCOUNTER — Telehealth: Payer: Self-pay

## 2024-10-23 ENCOUNTER — Encounter (HOSPITAL_BASED_OUTPATIENT_CLINIC_OR_DEPARTMENT_OTHER)

## 2024-10-23 NOTE — Telephone Encounter (Signed)
 Pt just called and asked if we have rec the clearance to move fwd with her in office PX, I do not see one in the chart

## 2024-10-28 ENCOUNTER — Encounter (HOSPITAL_BASED_OUTPATIENT_CLINIC_OR_DEPARTMENT_OTHER)

## 2024-10-28 ENCOUNTER — Ambulatory Visit

## 2024-10-28 VITALS — BP 120/60 | HR 62 | Temp 98.0°F | Ht 61.0 in | Wt 122.4 lb

## 2024-10-28 DIAGNOSIS — Z0000 Encounter for general adult medical examination without abnormal findings: Secondary | ICD-10-CM

## 2024-10-28 NOTE — Patient Instructions (Addendum)
 [name],  Thank you for taking the time for your Medicare Wellness Visit. I appreciate your continued commitment to your health goals. Please review the care plan we discussed, and feel free to reach out if I can assist you further.  Please note that Annual Wellness Visits do not include a physical exam. Some assessments may be limited, especially if the visit was conducted virtually. If needed, we may recommend an in-person follow-up with your provider.  Ongoing Care Seeing your primary care provider every 3 to 6 months helps us  monitor your health and provide consistent, personalized care.   Referrals If a referral was made during today's visit and you haven't received any updates within two weeks, please contact the referred provider directly to check on the status.  Recommended Screenings:  Health Maintenance  Topic Date Due   Kidney health urinalysis for diabetes  Never done   Zoster (Shingles) Vaccine (1 of 2) 05[birth year]05/1959   Complete foot exam   04/26/2018   COVID-19 Vaccine (3 - Pfizer risk series) 12/27/2019   Hemoglobin A1C  02/01/2023   Breast Cancer Screening  11/05/2024   Eye exam for diabetics  07/23/2025   Yearly kidney function blood test for diabetes  09/22/2025   Medicare Annual Wellness Visit  10/28/2025   DTaP/Tdap/Td vaccine (3 - Td or Tdap) 10/18/2033   Pneumococcal Vaccine for age over 65  Completed   Flu Shot  Completed   Osteoporosis screening with Bone Density Scan  Completed   Meningitis B Vaccine  Aged Out       10/28/2024    1:33 PM  Advanced Directives  Does Patient Have a Medical Advance Directive? Yes  Type of Estate Agent of [place];Living will  Does patient want to make changes to medical advance directive? No - Patient declined  Copy of Healthcare Power of Attorney in Chart? Yes - validated most recent copy scanned in chart (See row information)    Vision: Annual vision screenings are recommended for early detection of  glaucoma, cataracts, and diabetic retinopathy. These exams can also reveal signs of chronic conditions such as diabetes and high blood pressure.  Dental: Annual dental screenings help detect early signs of oral cancer, gum disease, and other conditions linked to overall health, including heart disease and diabetes.  Please see the attached documents for additional preventive care recommendations.

## 2024-10-28 NOTE — Progress Notes (Signed)
 "  Chief Complaint  Patient presents with   Medicare Wellness     Subjective:   [name] is a 84 y.o. female who presents for a Medicare Annual Wellness Visit.  Visit info / Clinical Intake: Medicare Wellness Visit Type:: Subsequent Annual Wellness Visit Persons participating in visit and providing information:: patient Medicare Wellness Visit Mode:: In-person (required for WTM) Interpreter Needed?: No Pre-visit prep was completed: yes AWV questionnaire completed by patient prior to visit?: no Living arrangements:: (!) lives alone Patient's Overall Health Status Rating: good Typical amount of pain: some Does pain affect daily life?: no Are you currently prescribed opioids?: no  Dietary Habits and Nutritional Risks How many meals a day?: 2 Eats fruit and vegetables daily?: yes Most meals are obtained by: preparing own meals In the last 2 weeks, have you had any of the following?: none Diabetic:: no  Functional Status Activities of Daily Living (to include ambulation/medication): Independent Ambulation: Independent with device- listed below Home Assistive Devices/Equipment: Walker (specify Type); [place]; Eyeglasses Medication Administration: Independent Home Management (perform basic housework or laundry): Independent Manage your own finances?: yes Primary transportation is: driving Concerns about vision?: no *vision screening is required for WTM* Concerns about hearing?: no  Fall Screening Falls in the past year?: 1 Number of falls in past year: 1 Was there an injury with Fall?: 0 (Followed by medical attention) Fall Risk Category Calculator: 2 Patient Fall Risk Level: Moderate Fall Risk  Fall Risk Patient at Risk for Falls Due to: History of fall(s); Impaired balance/gait Fall risk Follow up: Falls evaluation completed  Home and Transportation Safety: All rugs have non-skid backing?: yes All stairs or steps have railings?: yes Grab bars in the bathtub or  shower?: yes Have non-skid surface in bathtub or shower?: yes Good home lighting?: yes Regular seat belt use?: yes Hospital stays in the last year:: (!) yes How many hospital stays:: 1 Reason: Fall evaluation  Cognitive Assessment Difficulty concentrating, remembering, or making decisions? : no Will 6CIT or Mini Cog be Completed: yes What year is it?: 0 points What month is it?: 0 points Give patient an address phrase to remember (5 components): [name]  About what time is it?: 0 points Count backwards from 20 to 1: 0 points Say the months of the year in reverse: 0 points Repeat the address phrase from earlier: 0 points 6 CIT Score: 0 points  Advance Directives (For Healthcare) Does Patient Have a Medical Advance Directive?: Yes Does patient want to make changes to medical advance directive?: No - Patient declined Type of Advance Directive: Healthcare Power of [place]; Living will Copy of Healthcare Power of Attorney in Chart?: Yes - validated most recent copy scanned in chart (See row information) Copy of Living Will in Chart?: Yes - validated most recent copy scanned in chart (See row information)  Reviewed/Updated  Reviewed/Updated: Reviewed All (Medical, Surgical, Family, Medications, Allergies, Care Teams, Patient Goals)    Allergies (verified) Atorvastatin  and Hydromorphone   Current Medications (verified) Outpatient Encounter Medications as of 10/28/2024  Medication Sig   cadexomer iodine  (IODOSORB) 0.9 % gel Apply 1 Application topically daily as needed for wound care. (Patient not taking: Reported on 10/28/2024)   Calcium  Carb-Cholecalciferol  (CALCIUM  CARBONATE-VITAMIN D3 PO) Take 1 tablet by mouth daily   cholecalciferol  (VITAMIN D) 1000 UNITS tablet Take 1,000 Units by mouth daily.   denosumab  (PROLIA ) 60 MG/ML SOLN injection Inject 60 mg into the skin every 6 (six) months.   docusate sodium  (  COLACE) 100 MG capsule Take 100 mg by mouth 2 (two)  times daily. (Patient not taking: Reported on 10/28/2024)   furosemide  (LASIX ) 20 MG tablet Take 1 tablet (20 mg total) by mouth daily.   gabapentin  (NEURONTIN ) 300 MG capsule TAKE 1 CAPSULE TWICE A DAY   Golimumab  (SIMPONI  ARIA IV) Inject into the vein. Takes infusion every 8 wks.   losartan  (COZAAR ) 50 MG tablet Take 1 tablet (50 mg total) by mouth daily.   Multiple Vitamins-Minerals (MULTIVITAMIN,TX-MINERALS) tablet Take 1 tablet by mouth daily.   Omega-3 Fatty Acids (FISH OIL OMEGA-3 PO) Take 1 capsule by mouth daily.   oxyCODONE  (OXYCONTIN ) 10 mg 12 hr tablet Take 1 tablet (10 mg total) by mouth every 12 (twelve) hours.   Oxycodone  HCl 10 MG TABS Take 1 tablet (10 mg total) by mouth every 6 (six) hours as needed. Take 1 tablet by mouth every 6 hours as needed for breakthrough pain   potassium chloride  (KLOR-CON  M) 10 MEQ tablet TAKE 1 TABLET TWICE A DAY   predniSONE  (DELTASONE ) 1 MG tablet TAKE 4 TABLETS DAILY WITH BREAKFAST   Vibegron  (GEMTESA ) 75 MG TABS Take 1 tablet (75 mg total) by mouth daily at 2 am.   vitamin B-12 (CYANOCOBALAMIN ) 100 MCG tablet Take 100 mcg by mouth daily.   No facility-administered encounter medications on file as of 10/28/2024.    History: Past Medical History:  Diagnosis Date   Allergic rhinitis    Anemia    Anxiety    Bronchiectasis    Carpal tunnel syndrome 07/13/2015   Bilateral   CVA (cerebral infarction) 10/2007   Right thalamic    Diverticulosis of colon    Gait disorder    GERD (gastroesophageal reflux disease)    History of breast cancer    HTN (hypertension)    Hyperlipidemia    Idiopathic pulmonary fibrosis    Left knee DJD    Migraine    Osteoporosis    Peripheral neuropathy    PMR (polymyalgia rheumatica)    Polyneuropathy in other diseases classified elsewhere 02/17/2013   Previous back surgery 10/09/12   Renal insufficiency    Rheumatoid arthritis (HCC)    Seizure disorder (HCC)    Spondylosis, cervical, with myelopathy 08/31/2015    C3-4 myelopathy   Temporal arteritis (HCC)    Right eye blind, 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   Type II or unspecified type diabetes mellitus without mention of complication, not stated as uncontrolled    2nd to steriods   Vitamin D deficiency    Past Surgical History:  Procedure Laterality Date   APPENDECTOMY  01/2021   CARPAL TUNNEL RELEASE Right    CATARACT EXTRACTION     OS - Summer 2010   CERVICAL FUSION  09/24/2008   4 rods and pins in place   CHOLECYSTECTOMY     COLONOSCOPY  12/15/2011   Procedure: COLONOSCOPY;  Surgeon: Renaye Sous, MD;  Location: [facility];  Service: Endoscopy;  Laterality: N/A;   LUMBAR FUSION  04/24/2010   W/Mechanical fixation - L2-5 [facility]   LUMBAR FUSION  09/25/2011   rods in hips (to stabilize).   MASTECTOMY     NECK SURGERY     rheumatoid nodule removal     SPINAL FUSION  07/25/2010   T10-L2 interbody fusion / [facility]   TONSILLECTOMY     TOTAL KNEE ARTHROPLASTY     Left   Family History  Problem Relation Age of Onset   Brain cancer  Mother    Hypertension Mother    Arthritis Mother    Dementia Father    Hypertension Father    Arthritis Father    Breast cancer Sister    Lung cancer Sister    Lung disease Neg Hx    Rheumatologic disease Neg Hx    Social History   Occupational History   Occupation: Clinical Cytogeneticist: RETIRED    Comment: retired  Tobacco Use   Smoking status: Never   Smokeless tobacco: Never   Tobacco comments:    Father   Vaping Use   Vaping status: Never Used  Substance and Sexual Activity   Alcohol use: Not Currently    Comment: heavy drinker until 1995 - sobriety with AA   Drug use: No   Sexual activity: Not Currently   Tobacco Counseling Counseling given: No Tobacco comments: Father   SDOH Screenings   Food Insecurity: No Food Insecurity (10/28/2024)  Housing: Low Risk (10/28/2024)  Transportation Needs: No Transportation Needs  (10/28/2024)  Utilities: Not At Risk (10/28/2024)  Alcohol Screen: Low Risk (07/12/2023)  Depression (PHQ2-9): Low Risk (10/28/2024)  Financial Resource Strain: Low Risk (07/12/2023)  Physical Activity: Inactive (10/28/2024)  Social Connections: Moderately Integrated (10/28/2024)  Stress: No Stress Concern Present (10/28/2024)  Tobacco Use: Low Risk (10/28/2024)  Health Literacy: Adequate Health Literacy (10/28/2024)   See flowsheets for full screening details  Depression Screen PHQ 2 & 9 Depression Scale- Over the past 2 weeks, how often have you been bothered by any of the following problems? Little interest or pleasure in doing things: 0 Feeling down, depressed, or hopeless (PHQ Adolescent also includes...irritable): 0 PHQ-2 Total Score: 0     Goals Addressed               This Visit's Progress     Increase physical activity (pt-stated)        Remain active!             Objective:    Today's Vitals   10/28/24 1324  BP: 120/60  Pulse: 62  Temp: 98 F (36.7 C)  TempSrc: Oral  SpO2: 95%  Weight: 122 lb 6.4 oz (55.5 kg)  Height: 5' 1 (1.549 m)   Body mass index is 23.13 kg/m.  Hearing/Vision screen Hearing Screening - Comments:: Denies hearing difficulties   Vision Screening - Comments:: Wears rx glasses - up to date with routine eye exams with  Dr Robinson Immunizations and Health Maintenance Health Maintenance  Topic Date Due   Diabetic kidney evaluation - Urine ACR  Never done   Zoster Vaccines- Shingrix (1 of 2) 05/05/1959   FOOT EXAM  04/26/2018   COVID-19 Vaccine (3 - Pfizer risk series) 12/27/2019   HEMOGLOBIN A1C  02/01/2023   Mammogram  11/05/2024   OPHTHALMOLOGY EXAM  07/23/2025   Diabetic kidney evaluation - eGFR measurement  09/22/2025   Medicare Annual Wellness (AWV)  10/28/2025   DTaP/Tdap/Td (3 - Td or Tdap) 10/18/2033   Pneumococcal Vaccine: 50+ Years  Completed   Influenza Vaccine  Completed   Bone Density Scan  Completed   Meningococcal B Vaccine   Aged Out        Assessment/Plan:  This is a routine wellness examination for [place].  Patient Care Team: Micheal Wolm ORN, MD as PCP - General (Family Medicine) Jenel Carlin POUR, MD (Inactive) (Neurology) Mai Lynwood FALCON, MD as Consulting Physician (Rheumatology) Livingston Rigg, MD as Consulting Physician (Dermatology) [place], Jon DEL,  RPH (Pharmacist)  I have personally reviewed and noted the following in the patients chart:   Medical and social history Use of alcohol, tobacco or illicit drugs  Current medications and supplements including opioid prescriptions. Functional ability and status Nutritional status Physical activity Advanced directives List of other physicians Hospitalizations, surgeries, and ER visits in previous 12 months Vitals Screenings to include cognitive, depression, and falls Referrals and appointments  No orders of the defined types were placed in this encounter.  In addition, I have reviewed and discussed with patient certain preventive protocols, quality metrics, and best practice recommendations. A written personalized care plan for preventive services as well as general preventive health recommendations were provided to patient.   Rojelio LELON Blush, LPN   03/28/7972   Return in 53 weeks (on 11/03/2025).  After Visit Summary: (In Person-Printed) AVS printed and given to the patient  Nurse Notes: Patient advised to keep follow-up appointment with PCP (11/17/24) "

## 2024-10-30 ENCOUNTER — Ambulatory Visit: Admitting: Podiatry

## 2024-10-30 ENCOUNTER — Encounter (HOSPITAL_BASED_OUTPATIENT_CLINIC_OR_DEPARTMENT_OTHER): Admitting: Physical Therapy

## 2024-10-30 NOTE — Progress Notes (Unsigned)
 Left plantar midfoot ulcer fat layer exposed Betadine wet-to-dry dressing surgical shoe

## 2024-11-04 ENCOUNTER — Encounter (HOSPITAL_BASED_OUTPATIENT_CLINIC_OR_DEPARTMENT_OTHER): Admitting: Physical Therapy

## 2024-11-06 ENCOUNTER — Encounter (HOSPITAL_BASED_OUTPATIENT_CLINIC_OR_DEPARTMENT_OTHER): Admitting: Physical Therapy

## 2024-11-11 ENCOUNTER — Encounter (HOSPITAL_BASED_OUTPATIENT_CLINIC_OR_DEPARTMENT_OTHER): Admitting: Physical Therapy

## 2024-11-13 ENCOUNTER — Encounter (HOSPITAL_BASED_OUTPATIENT_CLINIC_OR_DEPARTMENT_OTHER): Admitting: Physical Therapy

## 2024-11-17 ENCOUNTER — Ambulatory Visit: Admitting: Family Medicine

## 2024-11-18 ENCOUNTER — Encounter (HOSPITAL_BASED_OUTPATIENT_CLINIC_OR_DEPARTMENT_OTHER)

## 2024-11-20 ENCOUNTER — Encounter (HOSPITAL_BASED_OUTPATIENT_CLINIC_OR_DEPARTMENT_OTHER): Admitting: Physical Therapy

## 2024-11-20 ENCOUNTER — Ambulatory Visit: Admitting: Podiatry

## 2024-12-01 ENCOUNTER — Encounter: Admitting: Plastic Surgery

## 2025-01-07 ENCOUNTER — Ambulatory Visit: Admitting: Neurology

## 2025-11-03 ENCOUNTER — Ambulatory Visit
